# Patient Record
Sex: Female | Born: 1950
Health system: Southern US, Community
[De-identification: ages and names within clinical notes are randomized; demographics above are authoritative.]

## PROBLEM LIST (undated history)

## (undated) DIAGNOSIS — I10 Essential (primary) hypertension: Secondary | ICD-10-CM

## (undated) DIAGNOSIS — J45909 Unspecified asthma, uncomplicated: Secondary | ICD-10-CM

## (undated) DIAGNOSIS — K5730 Diverticulosis of large intestine without perforation or abscess without bleeding: Secondary | ICD-10-CM

## (undated) DIAGNOSIS — N83299 Other ovarian cyst, unspecified side: Secondary | ICD-10-CM

## (undated) DIAGNOSIS — G459 Transient cerebral ischemic attack, unspecified: Secondary | ICD-10-CM

## (undated) DIAGNOSIS — I059 Rheumatic mitral valve disease, unspecified: Secondary | ICD-10-CM

## (undated) DIAGNOSIS — I4719 Other supraventricular tachycardia: Secondary | ICD-10-CM

## (undated) DIAGNOSIS — R0602 Shortness of breath: Secondary | ICD-10-CM

## (undated) DIAGNOSIS — E119 Type 2 diabetes mellitus without complications: Secondary | ICD-10-CM

## (undated) DIAGNOSIS — Z950 Presence of cardiac pacemaker: Secondary | ICD-10-CM

## (undated) DIAGNOSIS — K219 Gastro-esophageal reflux disease without esophagitis: Secondary | ICD-10-CM

## (undated) DIAGNOSIS — K644 Residual hemorrhoidal skin tags: Secondary | ICD-10-CM

## (undated) DIAGNOSIS — K589 Irritable bowel syndrome without diarrhea: Secondary | ICD-10-CM

## (undated) DIAGNOSIS — G4730 Sleep apnea, unspecified: Secondary | ICD-10-CM

## (undated) DIAGNOSIS — Z79890 Hormone replacement therapy (postmenopausal): Secondary | ICD-10-CM

## (undated) DIAGNOSIS — K7460 Unspecified cirrhosis of liver: Secondary | ICD-10-CM

## (undated) DIAGNOSIS — R197 Diarrhea, unspecified: Secondary | ICD-10-CM

## (undated) DIAGNOSIS — R569 Unspecified convulsions: Secondary | ICD-10-CM

## (undated) DIAGNOSIS — I779 Disorder of arteries and arterioles, unspecified: Secondary | ICD-10-CM

## (undated) DIAGNOSIS — M1711 Unilateral primary osteoarthritis, right knee: Secondary | ICD-10-CM

## (undated) HISTORY — PX: TONSILLECTOMY: SUR1361

## (undated) HISTORY — DX: Unilateral primary osteoarthritis, right knee: M17.11

## (undated) HISTORY — DX: Irritable bowel syndrome, unspecified: K58.9

## (undated) HISTORY — DX: Other supraventricular tachycardia: I47.19

## (undated) HISTORY — DX: Diverticulosis of large intestine without perforation or abscess without bleeding: K57.30

## (undated) HISTORY — DX: Gastro-esophageal reflux disease without esophagitis: K21.9

## (undated) HISTORY — DX: Unspecified convulsions: R56.9

## (undated) HISTORY — DX: Other ovarian cyst, unspecified side: N83.299

## (undated) HISTORY — DX: Type 2 diabetes mellitus without complications: E11.9

## (undated) HISTORY — DX: Disorder of arteries and arterioles, unspecified: I77.9

## (undated) HISTORY — DX: Rheumatic mitral valve disease, unspecified: I05.9

## (undated) HISTORY — PX: TUBAL LIGATION: SHX77

## (undated) HISTORY — DX: Essential (primary) hypertension: I10

## (undated) HISTORY — DX: Transient cerebral ischemic attack, unspecified: G45.9

## (undated) HISTORY — DX: Presence of cardiac pacemaker: Z95.0

## (undated) HISTORY — DX: Residual hemorrhoidal skin tags: K64.4

## (undated) HISTORY — DX: Diarrhea, unspecified: R19.7

## (undated) HISTORY — PX: OOPHORECTOMY: SHX86

## (undated) HISTORY — DX: Hormone replacement therapy: Z79.890

---

## 1978-02-18 HISTORY — PX: CHOLECYSTECTOMY: SHX55

## 1988-02-19 HISTORY — PX: VAGINAL HYSTERECTOMY: SUR661

## 2001-04-29 ENCOUNTER — Ambulatory Visit (HOSPITAL_COMMUNITY): Admission: RE | Admit: 2001-04-29 | Discharge: 2001-04-29 | Payer: Self-pay | Admitting: Gastroenterology

## 2001-08-05 ENCOUNTER — Encounter: Payer: Self-pay | Admitting: Gastroenterology

## 2001-08-05 ENCOUNTER — Encounter: Admission: RE | Admit: 2001-08-05 | Discharge: 2001-08-05 | Payer: Self-pay | Admitting: Gastroenterology

## 2004-10-08 ENCOUNTER — Ambulatory Visit: Payer: Self-pay | Admitting: Cardiology

## 2004-10-11 ENCOUNTER — Ambulatory Visit: Payer: Self-pay | Admitting: Internal Medicine

## 2004-10-11 ENCOUNTER — Inpatient Hospital Stay (HOSPITAL_BASED_OUTPATIENT_CLINIC_OR_DEPARTMENT_OTHER): Admission: RE | Admit: 2004-10-11 | Discharge: 2004-10-11 | Payer: Self-pay | Admitting: Cardiology

## 2004-11-02 ENCOUNTER — Ambulatory Visit: Payer: Self-pay | Admitting: Cardiology

## 2006-07-01 ENCOUNTER — Ambulatory Visit: Payer: Self-pay | Admitting: Cardiology

## 2006-09-25 ENCOUNTER — Ambulatory Visit (HOSPITAL_COMMUNITY): Admission: RE | Admit: 2006-09-25 | Discharge: 2006-09-25 | Payer: Self-pay | Admitting: Internal Medicine

## 2006-12-11 ENCOUNTER — Ambulatory Visit (HOSPITAL_COMMUNITY): Admission: RE | Admit: 2006-12-11 | Discharge: 2006-12-11 | Payer: Self-pay | Admitting: Family Medicine

## 2007-05-07 ENCOUNTER — Inpatient Hospital Stay (HOSPITAL_COMMUNITY): Admission: EM | Admit: 2007-05-07 | Discharge: 2007-05-08 | Payer: Self-pay | Admitting: Emergency Medicine

## 2007-05-08 ENCOUNTER — Encounter (INDEPENDENT_AMBULATORY_CARE_PROVIDER_SITE_OTHER): Payer: Self-pay | Admitting: Family Medicine

## 2007-05-08 ENCOUNTER — Ambulatory Visit: Payer: Self-pay | Admitting: Cardiology

## 2007-10-23 LAB — HM COLONOSCOPY

## 2008-01-01 ENCOUNTER — Ambulatory Visit (HOSPITAL_COMMUNITY): Admission: RE | Admit: 2008-01-01 | Discharge: 2008-01-01 | Payer: Self-pay | Admitting: Family Medicine

## 2008-08-09 ENCOUNTER — Ambulatory Visit (HOSPITAL_COMMUNITY): Payer: Self-pay | Admitting: Psychiatry

## 2008-08-10 ENCOUNTER — Ambulatory Visit (HOSPITAL_COMMUNITY): Admission: RE | Admit: 2008-08-10 | Discharge: 2008-08-10 | Payer: Self-pay | Admitting: Family Medicine

## 2008-08-10 ENCOUNTER — Encounter: Payer: Self-pay | Admitting: Orthopedic Surgery

## 2008-08-15 ENCOUNTER — Ambulatory Visit (HOSPITAL_COMMUNITY): Payer: Self-pay | Admitting: Psychology

## 2008-08-24 ENCOUNTER — Ambulatory Visit: Payer: Self-pay | Admitting: Orthopedic Surgery

## 2008-08-24 DIAGNOSIS — M25819 Other specified joint disorders, unspecified shoulder: Secondary | ICD-10-CM | POA: Insufficient documentation

## 2008-08-24 DIAGNOSIS — M25519 Pain in unspecified shoulder: Secondary | ICD-10-CM | POA: Insufficient documentation

## 2008-08-24 DIAGNOSIS — M7580 Other shoulder lesions, unspecified shoulder: Secondary | ICD-10-CM

## 2008-08-30 ENCOUNTER — Ambulatory Visit (HOSPITAL_COMMUNITY): Payer: Self-pay | Admitting: Psychiatry

## 2008-10-04 ENCOUNTER — Ambulatory Visit (HOSPITAL_COMMUNITY): Payer: Self-pay | Admitting: Psychiatry

## 2008-10-11 ENCOUNTER — Ambulatory Visit (HOSPITAL_COMMUNITY): Payer: Self-pay | Admitting: Psychology

## 2008-11-02 ENCOUNTER — Ambulatory Visit (HOSPITAL_COMMUNITY): Payer: Self-pay | Admitting: Psychology

## 2008-11-03 ENCOUNTER — Ambulatory Visit (HOSPITAL_COMMUNITY): Payer: Self-pay | Admitting: Psychiatry

## 2008-11-18 ENCOUNTER — Ambulatory Visit (HOSPITAL_COMMUNITY): Payer: Self-pay | Admitting: Psychology

## 2008-12-19 ENCOUNTER — Ambulatory Visit (HOSPITAL_COMMUNITY): Payer: Self-pay | Admitting: Psychology

## 2008-12-22 ENCOUNTER — Encounter: Payer: Self-pay | Admitting: Orthopedic Surgery

## 2009-01-05 ENCOUNTER — Ambulatory Visit (HOSPITAL_COMMUNITY): Payer: Self-pay | Admitting: Psychiatry

## 2009-01-16 ENCOUNTER — Ambulatory Visit (HOSPITAL_COMMUNITY): Payer: Self-pay | Admitting: Psychology

## 2009-02-01 ENCOUNTER — Ambulatory Visit (HOSPITAL_COMMUNITY): Admission: RE | Admit: 2009-02-01 | Discharge: 2009-02-01 | Payer: Self-pay | Admitting: Family Medicine

## 2009-02-13 ENCOUNTER — Ambulatory Visit (HOSPITAL_COMMUNITY): Payer: Self-pay | Admitting: Psychology

## 2009-03-07 ENCOUNTER — Ambulatory Visit (HOSPITAL_COMMUNITY): Payer: Self-pay | Admitting: Psychiatry

## 2009-03-16 ENCOUNTER — Ambulatory Visit (HOSPITAL_COMMUNITY): Payer: Self-pay | Admitting: Psychology

## 2009-04-06 ENCOUNTER — Ambulatory Visit (HOSPITAL_COMMUNITY): Payer: Self-pay | Admitting: Psychology

## 2009-05-04 ENCOUNTER — Ambulatory Visit (HOSPITAL_COMMUNITY): Payer: Self-pay | Admitting: Psychiatry

## 2009-05-12 ENCOUNTER — Ambulatory Visit (HOSPITAL_COMMUNITY): Payer: Self-pay | Admitting: Psychology

## 2009-06-12 ENCOUNTER — Ambulatory Visit (HOSPITAL_COMMUNITY): Payer: Self-pay | Admitting: Psychology

## 2009-07-13 ENCOUNTER — Ambulatory Visit (HOSPITAL_COMMUNITY): Payer: Self-pay | Admitting: Psychology

## 2009-07-18 ENCOUNTER — Ambulatory Visit (HOSPITAL_COMMUNITY): Payer: Self-pay | Admitting: Psychiatry

## 2009-09-07 ENCOUNTER — Ambulatory Visit (HOSPITAL_COMMUNITY): Admission: RE | Admit: 2009-09-07 | Discharge: 2009-09-07 | Payer: Self-pay | Admitting: Ophthalmology

## 2009-09-14 ENCOUNTER — Ambulatory Visit (HOSPITAL_COMMUNITY): Payer: Self-pay | Admitting: Psychiatry

## 2009-10-02 ENCOUNTER — Ambulatory Visit (HOSPITAL_COMMUNITY): Payer: Self-pay | Admitting: Psychology

## 2009-11-14 ENCOUNTER — Ambulatory Visit (HOSPITAL_COMMUNITY): Payer: Self-pay | Admitting: Psychiatry

## 2009-11-20 ENCOUNTER — Ambulatory Visit (HOSPITAL_COMMUNITY): Payer: Self-pay | Admitting: Psychology

## 2009-11-27 ENCOUNTER — Ambulatory Visit (HOSPITAL_COMMUNITY): Payer: Self-pay | Admitting: Psychology

## 2009-12-08 ENCOUNTER — Ambulatory Visit (HOSPITAL_COMMUNITY): Admission: RE | Admit: 2009-12-08 | Discharge: 2009-12-08 | Payer: Self-pay | Admitting: Family Medicine

## 2009-12-28 ENCOUNTER — Ambulatory Visit (HOSPITAL_COMMUNITY): Payer: Self-pay | Admitting: Psychology

## 2010-01-30 ENCOUNTER — Ambulatory Visit: Payer: Self-pay | Admitting: Internal Medicine

## 2010-02-13 ENCOUNTER — Ambulatory Visit (HOSPITAL_COMMUNITY): Payer: Self-pay | Admitting: Psychiatry

## 2010-02-28 ENCOUNTER — Encounter
Admission: RE | Admit: 2010-02-28 | Discharge: 2010-02-28 | Payer: Self-pay | Source: Home / Self Care | Attending: Sports Medicine | Admitting: Sports Medicine

## 2010-03-11 ENCOUNTER — Encounter: Payer: Self-pay | Admitting: Family Medicine

## 2010-03-13 ENCOUNTER — Ambulatory Visit (HOSPITAL_COMMUNITY)
Admission: RE | Admit: 2010-03-13 | Discharge: 2010-03-13 | Payer: Self-pay | Source: Home / Self Care | Attending: Family Medicine | Admitting: Family Medicine

## 2010-05-05 LAB — BASIC METABOLIC PANEL
CO2: 24 mEq/L (ref 19–32)
Calcium: 9.2 mg/dL (ref 8.4–10.5)
Chloride: 101 mEq/L (ref 96–112)
GFR calc Af Amer: >60 mL/min (ref >60)
Glucose, Bld: 300 mg/dL — ABNORMAL HIGH (ref 70–99)
Potassium: 3.7 mEq/L (ref 3.5–5.1)
Sodium: 135 mEq/L (ref 135–145)

## 2010-05-05 LAB — GLUCOSE, CAPILLARY: Glucose-Capillary: 131 mg/dL — ABNORMAL HIGH (ref 70–99)

## 2010-05-05 LAB — HEMOGLOBIN AND HEMATOCRIT, BLOOD: HCT: 36.1 % (ref 36.0–46.0)

## 2010-06-14 ENCOUNTER — Encounter (INDEPENDENT_AMBULATORY_CARE_PROVIDER_SITE_OTHER): Payer: BC Managed Care – PPO | Admitting: Psychiatry

## 2010-06-14 DIAGNOSIS — F39 Unspecified mood [affective] disorder: Secondary | ICD-10-CM

## 2010-07-03 NOTE — Consult Note (Signed)
Carol Richmond, Carol Richmond           ACCOUNT NO.:  [id number]   MEDICAL RECORD NO.:  [id number]          PATIENT TYPE:  INP   LOCATION:  A307                          FACILITY:  [facility]   PHYSICIAN:  Kofi A. Merlene Laughter, M.D. DATE OF BIRTH:  07/29/1950   DATE OF CONSULTATION:  DATE OF DISCHARGE:                                 CONSULTATION   REASON FOR CONSULTATION:  Syncope.  The patient is a 60 year old white  female who apparently had a spell about a week ago where she essentially  became amnestic for a while and really could not provide significant  details.  She apparently had a similar event a few months ago.  It  appears that a coworker related the description to the family, and  reports that the patient apparently became unresponsive, staring off in  space with a glassy-eyed appearance.  She has drooping of the right  lower facial area and fell asleep afterwards.  Since that event, the  family reports she has been having short-term memory problems.  No focal  weakness is reported.  Again, the patient is amnestic to the event.  The  family reports that she had a remote history of seizures and was on  dilantin many years ago.  They could not give me a description of the  seizures, however.  No headaches are reported or fevers.  No chest pain  or shortness of breath.  No tonic-clonic activity or loss of bowel or  bladder control.  No palpitations.   PAST MEDICAL HISTORY:  Significant for gastroesophageal reflux disease,  hypertension.   PAST SURGICAL HISTORY:  Cholecystectomy, tonsillectomy, tubal ligation.   ALLERGIES:  None known.   ADMISSION MEDICATIONS:  Topamax.  Unclear why she is taking this  medication.  Also Nexium.  She is also taking Ativan.  Doses are  unknown.   SOCIAL HISTORY:  No alcohol, tobacco, or illicit drug use.   REVIEW OF SYSTEMS:  Essentially unrevealing, other than stated in the  history of present illness.  Unchanged from the emergency room   physician.   PHYSICAL EXAMINATION:  Showed a thin, pleasant lady in no acute  distress.  Temperature is 97.4, pulse 78, respirations 20, blood pressure 97/63.  HEENT evaluation, neck is supple.  Head is normocephalic, atraumatic.  ABDOMEN:  Soft.  EXTREMITIES:  No significant edema.  MENTATION:  The patient is awake and alert.  She is lucid and coherent.  No dysarthria noted.  No aphasia noted.  CRANIAL NERVE EVALUATION:  Pupils are 5 mm and reactive.  Visual field  intact.  Extraocular movements are full.  Facial muscle strength is  symmetric.  Tongue is midline.  Uvula midline.  Carotid upstroke normal.  MOTOR EXAMINATION:  Shows normal tone and strength.  I see no pronator  drift.  Coordination is intact.  No tremors are noted.  No past-  pointing.  Reflexes are symmetric and normal, although she has extensor  plantar reflexes.  Sensation is normal to pinprick and light touch.   SUPPORTIVE DATA:  WBC 9, hemoglobin 13.9, platelet count 305,000.  Urinalysis shows specific gravity of 1030.  Urobilinogen  0.2.  Otherwise, negative.  Leukocytes negative.  Nitrites negative.  Alcohol  level, none detected.  Urine drug screen is positive for benzodiazepines  and amphetamines.  Magnesium 2.  Sodium 136, potassium 3.2, chloride  104, CO2 27, glucose 96, BUN 18, creatinine 0.7, calcium 8.6.  Liver  enzymes normal.   ASSESSMENT:  The patient symptomatology is most consistent with non-  convulsive complex partial seizures.  Transient ischemic attack is  possible, but seems less likely.   RECOMMENDATIONS:  1. EEG.  2. MRI of the brain.  3. Anticonvulsive medication, prefer Keppra.  She is on Topamax, but      we do not know the does of that medication.  In this situation      where she is apparently still having spells on that medication.      Thank you for the consultation.      Kofi A. Merlene Laughter, M.D.  Electronically Signed     KAD/MEDQ  D:  05/08/2007  T:  05/08/2007  Job:   [id number]

## 2010-07-03 NOTE — H&P (Signed)
NAMEGIULLIANA, MCROBERTS NO.:  [id number]   MEDICAL RECORD NO.:  [id number]          PATIENT TYPE:  INP   LOCATION:  A307                          FACILITY:  [facility]   PHYSICIAN:  Salem Caster, DO    DATE OF BIRTH:  09-27-1950   DATE OF ADMISSION:  05/07/2007  DATE OF DISCHARGE:  LH                              HISTORY & PHYSICAL   PRIMARY CARE PHYSICIAN:  Dr. Hilma Favors.   CHIEF COMPLAINT:  Syncope.   HISTORY OF PRESENT ILLNESS:  This is a 60 year old female who presents  with an acute syncopal episode.  Apparently, the patient blacked out at  work.  Patient states that up until that day, she was feeling fine.  Since then, she has been very fatigued.  She did admit to the emergency  room that she was having a lot of worrying and felt somewhat depressed.  Patient has a history of seizures many, many years ago and was placed on  medication but has not had any episodes for quite some time.  The  patient denied any major headaches, chills, chest pain, nausea,  vomiting, diarrhea, or diaphoresis.  The patient presented with the  above complaints and seems stable on my exam.   PAST MEDICAL HISTORY:  1. Hypertension.  2. Irritable bowel.  3. GERD.  4. Migraines.  5. Anxiety.  6. Allergic rhinitis.   REVIEW OF SYSTEMS:  Please see HPI.   ALLERGIES:  No known drug allergies.   HOME MEDICATIONS:  1. Bentyl 10 mg 3 times daily.  2. Ativan 1 mg 3 times daily.  3. Topamax 50 mg twice daily.  4. Allegra 180 mg daily.  5. Singulair 10 mg daily.  6. Adipex-P 37.5 mg daily.  7. Nexium 40 mg daily.  8. Celexa 40 mg daily.  9. Lisinopril/HCTZ 10/12.5 mg daily.  10.Premarin 0.625, alternate schedule, 1 tablet daily, then 2 tablets.   PAST SURGICAL HISTORY:  1. Cholecystectomy.  2. Tonsillectomy.  3. Tubal ligation.  4. Hysterectomy.   SOCIAL HISTORY:  No history of smoking, alcohol, or drug abuse.   PHYSICAL EXAMINATION:  VITAL SIGNS:  Temperature 97.5, pulse  71,  respirations 20, blood pressure 95/62.  GENERAL:  She is awake, alert and oriented.  No acute distress.  HEENT:  Head is normocephalic and atraumatic.  Eyes are PERRL, EOMI.  NECK:  Soft, supple, nontender, nondistended.  LUNGS:  Clear to auscultation bilaterally.  No wheezes, rales or  rhonchi.  CARDIOVASCULAR:  Regular rate and rhythm.  No murmurs, rubs or gallops.  ABDOMEN:  Soft, nontender, nondistended.  No rigidity or guarding.  NEUROLOGIC:  She is alert and oriented x3.  Cranial nerves II-XII are  grossly intact.  Patient did admit to some unsteady gait with slight  lightheadedness.  EXTREMITIES:  No clubbing, cyanosis or edema.   LABS:  Lipid profile:  Cholesterol 196, triglycerides 192, HDL 38, LDL  120.  Sodium 136, potassium 3.8, chloride 107, CO2 26, glucose 100, BUN  15, creatinine 0.74, phosphorus 2.9, magnesium 2.4.  Total creatinine  kinase is 32.  CK-MB is 0.6.  Troponin 0.02.  PTT  is 27.  PT is 12.6.  INR 0.9.  White count 8.3, hemoglobin 12.9, hematocrit 36.3, platelets  275.  Urine drug screen was positive for benzodiazepines and  amphetamines.  Ethanol level was less than 5.   Patient did have an MRI of her brain without contrast performed.  It  showed:  1)  Abnormal signal in the pons, including dorsally in the  region of the MLS and the hemispheric white matter, including  involvement of the deep periventricular white matter.  The constellation  of findings raised the possibility of multiple sclerosis.  The findings  could simply be due to old small vessel insult.  No evidence of acute  stroke.  2)  Fluid in the left mastoid air cells.   ASSESSMENT/PLAN:  1. Syncopal episode:  Patient will be admitted to a general medical      bed.  Neurology will be consulted.  Patient will get carotid      Doppler studies.  Patient will be rehydrated and get orthostatic      vital signs.  Will also order a 2D echocardiogram.  2. For a history of hypertension, patient  will be placed on home      medications.  3. Anxiety/depression:  Patient will also be placed on her home      medications at this time.  4. Patient will be on DVT as well as GI prophylaxis also.      Salem Caster, DO  Electronically Signed     SM/MEDQ  D:  05/08/2007  T:  05/08/2007  Job:  [id number]   cc:   Dr. Hilma Favors

## 2010-07-03 NOTE — Procedures (Signed)
NAMEPERPETUA, ELLING           ACCOUNT NO.:  [id number]   MEDICAL RECORD NO.:  [id number]         PATIENT TYPE:  PINP   LOCATION:  A307                          FACILITY:  [facility]   PHYSICIAN:  Kofi A. Merlene Laughter, M.D. DATE OF BIRTH:  September 03, 1950   DATE OF PROCEDURE:  05/08/2007  DATE OF DISCHARGE:  05/08/2007                              EEG INTERPRETATION   HISTORY:  This is a 60 year old lady who presents with spells of  confusion suspicious for complex partial seizure.   MEDICATIONS:  1. Protonix.  2. Xanax.  3. Phenergan.  4. Zofran.  5. Tylenol.  6. Guaifenesin.  7. Topamax.  8. Nexium.  9. Ativan.   RECORDING DATE:  05/08/2007.   ANALYSIS:  A 16-channel recording using standard 10/20 system is  conducted for 23 minutes.  There is a well-formed posterior rhythm of 9  Hz which attenuates with eye opening.  Awake and drowsy activities are  observed.  Photic stimulation and hyperventilation are carried out  without significant changes in the background activity.  There is no  focal or lateralized slowing.  There is no epileptiform activity  observed.   IMPRESSION:  Normal recording of the awake and drowsy states.  If  clinically indicated, a sleep-deprived recording could be useful.      Kofi A. Merlene Laughter, M.D.  Electronically Signed     KAD/MEDQ  D:  05/12/2007  T:  05/12/2007  Job:  [id number]

## 2010-07-03 NOTE — Discharge Summary (Signed)
Carol Richmond, CUTBIRTH NO.:  [id number]   MEDICAL RECORD NO.:  [id number]          PATIENT TYPE:  INP   LOCATION:  A307                          FACILITY:  [facility]   PHYSICIAN:  Salem Caster, DO    DATE OF BIRTH:  05-17-1950   DATE OF ADMISSION:  05/07/2007  DATE OF DISCHARGE:  03/20/2009LH                               DISCHARGE SUMMARY   DISCHARGE DIAGNOSES:  1. Syncopal episode.  2. Probably complex partial seizure.  3. History of hypertension.  4. History of headaches.  5. History of irritable bowel syndrome .  6. History of anxiety and depression.   HOSPITAL COURSE:  This is a 60 year old female who presented with  syncopal episode.  The patient presented after having syncopal episode  at work.  The patient has been very lethargic and stated in the  emergency room that she was feeling somewhat depressed.  The patient  states she has been taking her medications as directed and overall has  been feeling not too bad, but does not remember much after having her  syncopal episode.  The patient has had no chest pain, shortness of  breath, diaphoresis, fevers or any numbness of any kind.  The patient  presented with these symptoms and she was admitted.  Neurology was  consulted.  EEG, 2-D echocardiogram and carotid Doppler studies were  performed.  Her EEG, carotid Dopplers and echocardiogram results are all  pending at this time, but the patient is greatly improved and she wants  to go home.  Neurology felt the patient had a complex partial seizure  and the patient was placed on Keppra 500 mg twice daily.  The patient  did have MRI of her brain performed which showed a possibility of MS.  It showed some hemispheric white matter including involvement of the  deep periventricular white matter, but also states that it could be due  to old small vessel insults.  No evidence of stroke and they found fluid  in the left mastoid air cells.  At this time, I feel the  patient is  stable to be discharged.  She will follow up with her primary care  physician and follow up with neurology in 1-2 weeks.   DISCHARGE PHYSICAL EXAMINATION:  VITAL SIGNS:  Temperature 97.5, pulse  71, respirations 20, blood pressure 95/62, saturations 99% on room air.   DISCHARGE LABORATORY DATA AND X-RAY FINDINGS:  Sodium 136, potassium  3.8, chloride 107, CO2 26, glucose 100, BUN 15, creatinine 0.74.  White  count 8.3, hemoglobin 12.9, hematocrit 36.3, platelets 275.  Phosphorus  2.9, magnesium 2.4, total creatinine kinase 32, CK-MB 0.6, troponin  0.02.   CONDITION ON DISCHARGE:  Stable.   DISPOSITION:  Discharged to home.   DIET:  Maintain low sodium, heart-healthy diet.   ACTIVITY:  Increase activity slowly.   FOLLOW UP:  The patient is to follow up with Dr. Hilma Favors in 1 week.  She  is to follow up with Dr. Merlene Laughter in 2 weeks.  The patient will be out  of work for approximately 2 weeks until she is seen by neurology.  Copy  of her imaging studies including carotid Doppler and 2-D echocardiogram  will be sent to Dr. Delanna Ahmadi office when reports are available.      Salem Caster, DO  Electronically Signed     SM/MEDQ  D:  05/08/2007  T:  05/08/2007  Job:  [id number]   cc:   Halford Chessman, M.D.  Fax: [phone]

## 2010-07-06 NOTE — Cardiovascular Report (Signed)
Carol Richmond, Carol Richmond NO.:  [id number]   MEDICAL RECORD NO.:  [id number]          PATIENT TYPE:  OIB   LOCATION:  6501                         FACILITY:  [facility]   PHYSICIAN:  Glori Bickers, M.D. LHCDATE OF BIRTH:  05-08-1950   DATE OF PROCEDURE:  10/11/2004  DATE OF DISCHARGE:  10/11/2004                              CARDIAC CATHETERIZATION   PRIMARY CARE PHYSICIAN:  Dr. Rowe Pavy in the [place].   CARDIOLOGIST:  Dr. Rozann Lesches also in the [place] office.   PATIENT IDENTIFICATION:  Carol Richmond is a 60 year old woman with a history  of hypertension who has had several week history of intermittent chest  discomfort which is primarily with exertion. Chest discomfort has both  typical and atypical features. She was recently admitted to [facility] where she ruled out for myocardial infarction. CT angiography of  the chest showed normal proximal coronary arteries; however, they were  unable to see the distal beds well. Given her ongoing chest pain, she was  referred for cardiac catheterization.   PROCEDURES PERFORMED:  1.  Selective coronary angiography.  2.  Left heart cath.  3.  Left ventriculogram.   DESCRIPTION OF PROCEDURE:  The risks and benefits of catheterization were  explained to Carol Richmond and consent was signed and placed on the chart. A  4-French arterial sheath was placed in the right femoral artery using  anterior puncture and a modified Seldinger technique. Standard catheters  including JL-4, JR-4 and angled pigtail were used throughout the procedure.  All catheter exchanges were made over wire. There were no apparent  complications.   Central aortic pressure is 113/73 with mean of 90. LV pressure was 113/2  with an EDP of 5. There was no gradient on aortic valve pullback.   Left main: Was normal.   LAD was a long vessel coursing to the apex. It gave off a very tiny first  diagonal and a large second diagonal. There was minor  irregularity in the  distal LAD. In the very tiny first diagonal, there was 75-80% ostial lesion.  This vessel was 1.0 mm.   Left circumflex was moderate to large size vessel. It gave off a large  branching OM1. The distal AV groove circumflex was small. There was minor  irregularity in the circumflex just prior to the takeoff of the OM1.   Right coronary artery was a large dominant vessel which gave off PDA and a  small PL. There was no angiographic CAD.   Left ventriculogram done in the RAO approach showed an EF of 60% with no  focal wall motion abnormalities. There was no mitral regurgitation. On  panning down over the abdominal aorta after of the left ventriculogram,  there was no evidence of abdominal aortic aneurysm.   ASSESSMENT:  1.  A minimal nonobstructive coronary disease in the major epicardial      vessels. There was a high grade lesion in the ostium of the tiny D1      which was not amenable to percutaneous intervention and would likely not      be the cause of her symptoms.  2.  Normal left ventricular function.   PLAN:  Will be for continued medical therapy. Consideration will be given to  a possible GI workup for other causes of her chest and epigastric  discomfort.      Glori Bickers, M.D. [facility]  Electronically Signed     DB/MEDQ  D:  10/11/2004  T:  10/11/2004  Job:  [id number]   cc:   Rowe Pavy, M.D.  [facility], [place]   Satira Sark, M.D. [facility]  [address] [place]  [place] [address]

## 2010-07-06 NOTE — Procedures (Signed)
[facility]  Patient:    Carol, Richmond Visit Number: [id number] MRN: [id number]          Service Type: END Location: ENDO Attending Physician:  Rafael Bihari Dictated by:   Elyse Jarvis Amedeo Plenty, M.D. Proc. Date: 04/29/01 Admit Date:  04/29/2001   CC:         [name], M.D.   Procedure Report  PROCEDURE:  Esophagogastroduodenoscopy.  INDICATION FOR PROCEDURE:  Refractory reflux symptoms in 60 year old patient requiring double dose Nexium therapy. Procedure is to help guide therapy in terms of possible antireflux surgery and to rule out Barretts esophagus.  DESCRIPTION OF PROCEDURE:  The patient was placed in the left lateral decubitus position and placed on the pulse monitor with continuous low flow oxygen delivered via nasal cannula. She was sedated with 70 mg IV Demerol and 6 mg IV Versed. The Olympus video endoscope was advanced under direct vision into the oropharynx and esophagus. The esophagus was straight and of normal caliber to the squamocolumnar line at approximately 38 cm. There was an end widely patent lower esophageal ring only seen during one occasion when the lower esophageal sphincter was fully relaxed. There also appeared to be a less than 1 cm sliding hiatal hernia. The stomach was entered and a small amount of liquid secretions were suctioned from the fundus. Retroflex view of the cardia was unremarkable. The fundus and body appeared normal. The antrum showed some mild erythema and granularity consistent with antral gastritis with no focal erosions or ulcers. The pylorus was nondeformed and easily allowed passage of the endoscope tip into the duodenum. Both the bulb and second portion were well inspected and appeared to be within normal limits. The scope was then withdrawn back into the stomach and CLO test obtained. The scope was then withdrawn and the patient returned to the recovery room in stable condition. He  tolerated the procedure well and there were no immediate complications.  IMPRESSION: 1. Mild antral gastritis. 2. Widely patent lower esophageal ring with minimal hiatal hernia.  PLAN:  Continue medical therapy but will discuss option of antireflux surgery, if it is appealing to her. Dictated by:   Elyse Jarvis Amedeo Plenty, M.D. Attending Physician:  Rafael Bihari DD:  04/29/01 TD:  04/29/01 Job: [id number]

## 2010-09-10 ENCOUNTER — Encounter (INDEPENDENT_AMBULATORY_CARE_PROVIDER_SITE_OTHER): Payer: Self-pay

## 2010-09-10 ENCOUNTER — Other Ambulatory Visit (INDEPENDENT_AMBULATORY_CARE_PROVIDER_SITE_OTHER): Payer: Self-pay | Admitting: *Deleted

## 2010-09-10 DIAGNOSIS — G43909 Migraine, unspecified, not intractable, without status migrainosus: Secondary | ICD-10-CM | POA: Insufficient documentation

## 2010-09-10 DIAGNOSIS — Z79890 Hormone replacement therapy (postmenopausal): Secondary | ICD-10-CM | POA: Insufficient documentation

## 2010-09-10 DIAGNOSIS — R569 Unspecified convulsions: Secondary | ICD-10-CM | POA: Insufficient documentation

## 2010-09-10 DIAGNOSIS — I10 Essential (primary) hypertension: Secondary | ICD-10-CM | POA: Insufficient documentation

## 2010-09-10 DIAGNOSIS — I152 Hypertension secondary to endocrine disorders: Secondary | ICD-10-CM | POA: Insufficient documentation

## 2010-09-10 DIAGNOSIS — K644 Residual hemorrhoidal skin tags: Secondary | ICD-10-CM | POA: Insufficient documentation

## 2010-09-10 DIAGNOSIS — K219 Gastro-esophageal reflux disease without esophagitis: Secondary | ICD-10-CM | POA: Insufficient documentation

## 2010-09-10 MED ORDER — DICYCLOMINE HCL 20 MG PO TABS: 20.0000 mg | ORAL_TABLET | Freq: Two times a day (BID) | ORAL | Status: DC

## 2010-09-10 NOTE — Telephone Encounter (Signed)
No Pharmacy entered , Approved by Dr. Laural Golden , Rx will be called into [facility], [place].  Patient will be called and made aware.

## 2010-10-10 ENCOUNTER — Other Ambulatory Visit (HOSPITAL_COMMUNITY): Payer: Self-pay | Admitting: Family Medicine

## 2010-10-10 DIAGNOSIS — H538 Other visual disturbances: Secondary | ICD-10-CM

## 2010-10-11 ENCOUNTER — Encounter (HOSPITAL_COMMUNITY): Payer: BC Managed Care – PPO | Admitting: Psychiatry

## 2010-11-12 LAB — COMPREHENSIVE METABOLIC PANEL
ALT: 14
AST: 17
BUN: 15
CO2: 26
CO2: 27
Chloride: 104
Chloride: 107
Creatinine, Ser: 0.7
Creatinine, Ser: 0.74
GFR calc Af Amer: >60
GFR calc non Af Amer: >60
GFR calc non Af Amer: >60
Glucose, Bld: 100 — ABNORMAL HIGH
Glucose, Bld: 96
Sodium: 136
Total Bilirubin: 0.3
Total Bilirubin: 0.3

## 2010-11-12 LAB — DIFFERENTIAL
Basophils Absolute: 0
Basophils Absolute: 0
Lymphocytes Relative: 36
Lymphocytes Relative: 37
Monocytes Absolute: 1
Neutro Abs: 4.3
Neutro Abs: 4.6
Neutrophils Relative %: 51
Neutrophils Relative %: 51

## 2010-11-12 LAB — URINALYSIS, ROUTINE W REFLEX MICROSCOPIC
Hgb urine dipstick: NEGATIVE
Nitrite: NEGATIVE
Protein, ur: NEGATIVE
Specific Gravity, Urine: >1.03 — ABNORMAL HIGH
Urobilinogen, UA: 0.2

## 2010-11-12 LAB — CULTURE, BLOOD (ROUTINE X 2)
Culture: NO GROWTH
Report Status: 3252009
Report Status: 3252009

## 2010-11-12 LAB — POCT CARDIAC MARKERS
Myoglobin, poc: 26.3
Operator id: 216471

## 2010-11-12 LAB — PROTIME-INR
INR: 0.9
Prothrombin Time: 12.6

## 2010-11-12 LAB — RAPID URINE DRUG SCREEN, HOSP PERFORMED
Amphetamines: POSITIVE — AB
Barbiturates: NOT DETECTED

## 2010-11-12 LAB — CARDIAC PANEL(CRET KIN+CKTOT+MB+TROPI): Troponin I: 0.02

## 2010-11-12 LAB — CBC
HCT: 36.3
Hemoglobin: 13.9
MCV: 85.4
MCV: 85.6
Platelets: 275
RBC: 4.24
RBC: 4.57
WBC: 8.3
WBC: 9.1

## 2010-11-12 LAB — MAGNESIUM: Magnesium: 2.4

## 2010-11-12 LAB — LIPID PANEL
Total CHOL/HDL Ratio: 5.2
VLDL: 38

## 2011-02-01 ENCOUNTER — Other Ambulatory Visit (HOSPITAL_COMMUNITY): Payer: Self-pay | Admitting: Psychiatry

## 2011-02-08 ENCOUNTER — Encounter (INDEPENDENT_AMBULATORY_CARE_PROVIDER_SITE_OTHER): Payer: Self-pay | Admitting: *Deleted

## 2011-02-28 ENCOUNTER — Ambulatory Visit (INDEPENDENT_AMBULATORY_CARE_PROVIDER_SITE_OTHER): Payer: BC Managed Care – PPO | Admitting: Internal Medicine

## 2011-02-28 ENCOUNTER — Encounter (INDEPENDENT_AMBULATORY_CARE_PROVIDER_SITE_OTHER): Payer: Self-pay | Admitting: Internal Medicine

## 2011-02-28 VITALS — BP 100/64 | HR 72 | Temp 97.9°F | Ht 64.0 in | Wt 195.9 lb

## 2011-02-28 NOTE — Patient Instructions (Signed)
CBC, CMet, Further recommendations once we have the lab work back.

## 2011-02-28 NOTE — Progress Notes (Addendum)
Subjective:     [name], female   DOB: Jan 19, 1951, 61 y.o.   MRN: [id number]  HPI Referred by Dr. Hilma Favors for elevated liver enzymes. Noted 01/31/2011 AST 186 and ALT 126. ALP 101. She tells me that they have not been elevated in the past. She denies hx of IV drug use. She does not have any tattoos. She denies prior hx of jaundice.  No new medications.  Four or five months ago she was on a z-pak.  Appetite is good. No weight loss. No abdominal pain. She usually has a BM about every day. No melena or bright red rectal bleeding.  Review of Systems see hpi Current Outpatient Prescriptions  Medication Sig Dispense Refill  . lamoTRIgine (LAMICTAL) 200 MG tablet Take 200 mg by mouth daily.        [name] lamoTRIgine (LAMICTAL) 200 MG tablet Take 200 mg by mouth daily.      [name] lisinopril-hydrochlorothiazide (PRINZIDE,ZESTORETIC) 20-12.5 MG per tablet Take 1 tablet by mouth daily.        [name] LORazepam (ATIVAN PO) Take by mouth 2 (two) times daily.        [name] losartan-hydrochlorothiazide (HYZAAR) 100-12.5 MG per tablet Take 1 tablet by mouth daily.      [name] METFORMIN HCL PO Take 500 mg by mouth 2 (two) times daily before a meal.       . mometasone-formoterol (DULERA) 100-5 MCG/ACT AERO Inhale 2 puffs into the lungs 2 (two) times daily.      [name] omeprazole (PRILOSEC) 20 MG capsule Take 20 mg by mouth daily.      . simvastatin (ZOCOR) 20 MG tablet Take 20 mg by mouth every evening.       Past Medical History  Diagnosis Date  . GERD (gastroesophageal reflux disease)   . Diarrhea   . Hypertension   . Hormone replacement therapy (postmenopausal)   . Migraine   . Seizures   . IBS (irritable bowel syndrome)   . Functional ovarian cysts   . Diverticulosis of colon (without mention of hemorrhage)   . Hemorrhoids, external, without mention of complication    Past Surgical History  Procedure Date  . Cholecystectomy 1980  . Vaginal hysterectomy 1990  . Tonsillectomy   . Oophorectomy     2012  Dr. Adah Perl in [place]   History   Social History  . Marital Status: Married    Spouse Name: N/A    Number of Children: N/A  . Years of Education: N/A   Occupational History  . Not on file.   Social History Main Topics  . Smoking status: Never Smoker   . Smokeless tobacco: Never Used  . Alcohol Use: No  . Drug Use: No  . Sexually Active:    Other Topics Concern  . Not on file   Social History Narrative  . No narrative on file   Family Status  Relation Status Death Age  . Mother Deceased   . Father Deceased   . Sister Alive   . Brother Alive   . Sister Deceased   . Brother Alive   . Brother Alive    No Known Allergies     Objective:   Physical Exam Filed Vitals:   02/28/11 1005  BP: 100/64  Pulse: 72  Temp: 97.9 F (36.6 C)  Height: 5' 4"  (1.626 m)  Weight: 195 lb 14.4 oz (88.86 kg)    Alert and oriented. Skin warm and dry. Oral mucosa is moist.   .  Sclera anicteric, conjunctivae is pink. Thyroid not enlarged. No cervical lymphadenopathy. Lungs clear. Heart regular rate and rhythm.  Abdomen is soft and obese.  Bowel sounds are positive. No hepatomegaly. No abdominal masses felt. No tenderness.  No edema to lower extremities. Patient is alert and oriented.      Assessment:    Elevated transaminases from questionable etiology.     Plan:    Will repeat a c-met today. If elevated will do work up to rule out auto immune process, Hep B and C.

## 2011-03-01 LAB — CBC WITH DIFFERENTIAL/PLATELET
Basophils Absolute: 0 10*3/uL (ref 0.0–0.1)
Basophils Relative: 0 % (ref 0–1)
Eosinophils Absolute: 0.2 10*3/uL (ref 0.0–0.7)
Hemoglobin: 12.8 g/dL (ref 12.0–15.0)
MCH: 29.5 pg (ref 26.0–34.0)
MCHC: 32.6 g/dL (ref 30.0–36.0)
Monocytes Relative: 12 % (ref 3–12)
Neutro Abs: 8 10*3/uL — ABNORMAL HIGH (ref 1.7–7.7)
Neutrophils Relative %: 53 % (ref 43–77)
Platelets: 310 10*3/uL (ref 150–400)
RDW: 14.7 % (ref 11.5–15.5)

## 2011-03-01 LAB — COMPREHENSIVE METABOLIC PANEL
AST: 76 U/L — ABNORMAL HIGH (ref 0–37)
Albumin: 4.4 g/dL (ref 3.5–5.2)
BUN: 19 mg/dL (ref 6–23)
Calcium: 9.4 mg/dL (ref 8.4–10.5)
Chloride: 103 mEq/L (ref 96–112)
Potassium: 4.7 mEq/L (ref 3.5–5.3)
Sodium: 136 mEq/L (ref 135–145)
Total Protein: 7 g/dL (ref 6.0–8.3)

## 2011-03-04 ENCOUNTER — Other Ambulatory Visit (INDEPENDENT_AMBULATORY_CARE_PROVIDER_SITE_OTHER): Payer: Self-pay | Admitting: Internal Medicine

## 2011-03-04 DIAGNOSIS — R748 Abnormal levels of other serum enzymes: Secondary | ICD-10-CM

## 2011-03-04 NOTE — Progress Notes (Signed)
U/S sch'd 03/14/11 @ 800 (745), NPO after midnight, patient ware

## 2011-03-06 ENCOUNTER — Telehealth (INDEPENDENT_AMBULATORY_CARE_PROVIDER_SITE_OTHER): Payer: Self-pay | Admitting: *Deleted

## 2011-03-06 DIAGNOSIS — K5792 Diverticulitis of intestine, part unspecified, without perforation or abscess without bleeding: Secondary | ICD-10-CM

## 2011-03-06 NOTE — Telephone Encounter (Addendum)
Per Carol Richmond the patient will need a repeat C-Met in 8 weeks, she will also be having a repeat U/S of Abd. In 8 weeks   Per Carol Richmond CMET is all that needs to be repeated in 8 weeks, not U/S

## 2011-03-14 ENCOUNTER — Ambulatory Visit (HOSPITAL_COMMUNITY)
Admission: RE | Admit: 2011-03-14 | Discharge: 2011-03-14 | Disposition: A | Discharge status: Home / Self Care | Payer: Medicare Other | Source: Ambulatory Visit | Attending: Internal Medicine | Admitting: Internal Medicine

## 2011-03-14 DIAGNOSIS — R748 Abnormal levels of other serum enzymes: Secondary | ICD-10-CM

## 2011-03-14 DIAGNOSIS — E7800 Pure hypercholesterolemia, unspecified: Secondary | ICD-10-CM | POA: Insufficient documentation

## 2011-03-14 DIAGNOSIS — I10 Essential (primary) hypertension: Secondary | ICD-10-CM | POA: Insufficient documentation

## 2011-03-14 DIAGNOSIS — E119 Type 2 diabetes mellitus without complications: Secondary | ICD-10-CM | POA: Insufficient documentation

## 2011-03-15 ENCOUNTER — Telehealth (INDEPENDENT_AMBULATORY_CARE_PROVIDER_SITE_OTHER): Payer: Self-pay | Admitting: Internal Medicine

## 2011-03-15 NOTE — Telephone Encounter (Signed)
Will call in an RX for Phenergan 12.46m po q 6 hrs [facility] [place]  I was unable to E prescribe this medication

## 2011-03-15 NOTE — Telephone Encounter (Signed)
C/o nausea and chills. Will eprescribe an Rx for phenergan 12.24m po q 6 as needed

## 2011-04-18 ENCOUNTER — Encounter (INDEPENDENT_AMBULATORY_CARE_PROVIDER_SITE_OTHER): Payer: Self-pay | Admitting: *Deleted

## 2011-07-26 ENCOUNTER — Ambulatory Visit
Admission: RE | Admit: 2011-07-26 | Discharge: 2011-07-26 | Disposition: A | Discharge status: Home / Self Care | Payer: Medicare Other | Source: Ambulatory Visit | Attending: Orthopedic Surgery | Admitting: Orthopedic Surgery

## 2011-07-26 ENCOUNTER — Other Ambulatory Visit: Payer: Self-pay | Admitting: Orthopedic Surgery

## 2011-07-26 DIAGNOSIS — M79606 Pain in leg, unspecified: Secondary | ICD-10-CM

## 2011-08-16 ENCOUNTER — Other Ambulatory Visit: Payer: Self-pay | Admitting: Orthopedic Surgery

## 2011-08-16 DIAGNOSIS — M25561 Pain in right knee: Secondary | ICD-10-CM

## 2011-08-23 ENCOUNTER — Ambulatory Visit
Admission: RE | Admit: 2011-08-23 | Discharge: 2011-08-23 | Disposition: A | Discharge status: Home / Self Care | Payer: Medicare Other | Source: Ambulatory Visit | Attending: Orthopedic Surgery | Admitting: Orthopedic Surgery

## 2011-08-23 DIAGNOSIS — M25561 Pain in right knee: Secondary | ICD-10-CM

## 2012-08-18 ENCOUNTER — Encounter: Payer: Self-pay | Admitting: Physician Assistant

## 2012-08-18 ENCOUNTER — Other Ambulatory Visit: Payer: Self-pay | Admitting: Physician Assistant

## 2012-08-18 DIAGNOSIS — K589 Irritable bowel syndrome without diarrhea: Secondary | ICD-10-CM

## 2012-08-18 DIAGNOSIS — M1711 Unilateral primary osteoarthritis, right knee: Secondary | ICD-10-CM | POA: Insufficient documentation

## 2012-08-18 DIAGNOSIS — K5730 Diverticulosis of large intestine without perforation or abscess without bleeding: Secondary | ICD-10-CM | POA: Insufficient documentation

## 2012-08-18 NOTE — H&P (Signed)
TOTAL KNEE ADMISSION H&P  Patient is being admitted for right total knee arthroplasty.  Subjective:  Chief Complaint:right knee pain.  HPI: Carol Richmond, 62 y.o. female, has a history of pain and functional disability in the right knee due to arthritis and has failed non-surgical conservative treatments for greater than 12 weeks to includeNSAID's and/or analgesics, corticosteriod injections, viscosupplementation injections, flexibility and strengthening excercises, supervised PT with diminished ADL's post treatment, use of assistive devices, weight reduction as appropriate and activity modification.  Onset of symptoms was gradual, starting 6 years ago with gradually worsening course since that time. The patient noted no past surgery on the right knee(s).  Patient currently rates pain in the right knee(s) at 10 out of 10 with activity. Patient has night pain, worsening of pain with activity and weight bearing, pain that interferes with activities of daily living, crepitus and joint swelling.  Patient has evidence of subchondral sclerosis, periarticular osteophytes and joint space narrowing by imaging studies.  There is no active infection.  Patient Active Problem List   Diagnosis Date Noted  . Right knee DJD   . IBS (irritable bowel syndrome)   . Diverticulosis of colon (without mention of hemorrhage)   . GERD (gastroesophageal reflux disease)   . Hypertension   . Hormone replacement therapy (postmenopausal)   . Migraine   . Seizures   . Hemorrhoids, external, without mention of complication   . Hemorrhoids, external, without mention of complication   . SHOULDER PAIN 08/24/2008  . IMPINGEMENT SYNDROME 08/24/2008   Past Medical History  Diagnosis Date  . GERD (gastroesophageal reflux disease)   . Diarrhea   . Hypertension   . Hormone replacement therapy (postmenopausal)   . Migraine   . Seizures   . IBS (irritable bowel syndrome)   . Functional ovarian cysts   . Diverticulosis  of colon (without mention of hemorrhage)   . Hemorrhoids, external, without mention of complication   . Right knee DJD     Past Surgical History  Procedure Laterality Date  . Cholecystectomy  1980  . Vaginal hysterectomy  1990  . Tonsillectomy    . Oophorectomy      2012 Dr. Adah Perl in [place]     (Not in a hospital admission) No Known Allergies  Current Outpatient Prescriptions on File Prior to Visit  Medication Sig Dispense Refill  . lisinopril-hydrochlorothiazide (PRINZIDE,ZESTORETIC) 20-12.5 MG per tablet Take 1 tablet by mouth daily.        [name] LORazepam (ATIVAN PO) Take by mouth 2 (two) times daily.        [name] METFORMIN HCL PO Take 1,000 mg by mouth 2 (two) times daily before a meal.       . mometasone-formoterol (DULERA) 100-5 MCG/ACT AERO Inhale 2 puffs into the lungs 2 (two) times daily.      [name] omeprazole (PRILOSEC) 20 MG capsule Take 20 mg by mouth daily.      . simvastatin (ZOCOR) 20 MG tablet Take 20 mg by mouth every evening.      . lamoTRIgine (LAMICTAL) 200 MG tablet Take 200 mg by mouth 2 (two) times daily.        No current facility-administered medications on file prior to visit.   Meds ordered this encounter  Medications  . estradiol (ESTRACE) 0.5 MG tablet    Sig: Take 0.5 mg by mouth daily.  . valACYclovir (VALTREX) 500 MG tablet    Sig: Take 500 mg by mouth 2 (two) times daily.  [name] escitalopram (LEXAPRO)  20 MG tablet    Sig: Take 20 mg by mouth daily.    History  Substance Use Topics  . Smoking status: Never Smoker   . Smokeless tobacco: Never Used  . Alcohol Use: No    Family History  Problem Relation Age of Onset  . Heart disease Mother   . Diabetes Mother   . Heart disease Brother   . Cancer Sister   . Heart disease Brother   . Heart disease Brother      Review of Systems  Constitutional: Negative.   HENT: Negative.   Eyes: Positive for blurred vision. Negative for double vision, photophobia, pain, discharge and redness.  Respiratory:  Negative.   Cardiovascular: Negative.   Gastrointestinal: Negative.   Genitourinary: Negative.   Musculoskeletal: Positive for joint pain.  Skin: Negative.   Neurological: Negative.   Endo/Heme/Allergies: Negative.   Psychiatric/Behavioral: Negative.     Objective:  Physical Exam  Constitutional: She is oriented to person, place, and time. She appears well-developed and well-nourished.  HENT:  Head: Normocephalic and atraumatic.  Eyes: Conjunctivae are normal. Pupils are equal, round, and reactive to light.  Neck: Normal range of motion. Neck supple.  Cardiovascular: Normal rate and regular rhythm.   Respiratory: Effort normal and breath sounds normal.  GI: Soft. Bowel sounds are normal.  Genitourinary:  Not pertinent to current symptomatology therefore not examined.  Musculoskeletal:  Antalgic gait. Right knee has fairly good range of motion. Positive crepitus. 1+ effusion. Exquisite joint line tenderness. Ligaments stable. bilateral calves non-tender neurovascularly intact. Skin warm and dry.   Neurological: She is alert and oriented to person, place, and time.  Skin: Skin is warm and dry.  Psychiatric: She has a normal mood and affect. Her behavior is normal.   X-RAYS: Right knee AP lateral sunrise views show medial and lateral compartmental narrowing. Periarticular spurs. Moderate patellofemoral degenerative changes.   Vital signs in last 24 hrs Last recorded: 07/01 1100   BP: 115/75 Pulse: 95  Temp: 98.3 F (36.8 C)    Height: 5' 3"  (1.6 m) SpO2: 96  Weight: 84.823 kg (187 lb)    Labs:   Estimated body mass index is 33.13 kg/(m^2) as calculated from the following:   Height as of this encounter: 5' 3"  (1.6 m).   Weight as of this encounter: 84.823 kg (187 lb).   Imaging Review Plain radiographs demonstrate severe degenerative joint disease of the right knee(s). The overall alignment isneutral. The bone quality appears to be good for age and reported activity  level.  Assessment/Plan:  End stage arthritis, right knee   The patient history, physical examination, clinical judgment of the provider and imaging studies are consistent with end stage degenerative joint disease of the right knee(s) and total knee arthroplasty is deemed medically necessary. The treatment options including medical management, injection therapy arthroscopy and arthroplasty were discussed at length. The risks and benefits of total knee arthroplasty were presented and reviewed. The risks due to aseptic loosening, infection, stiffness, patella tracking problems, thromboembolic complications and other imponderables were discussed. The patient acknowledged the explanation, agreed to proceed with the plan and consent was signed. Patient is being admitted for inpatient treatment for surgery, pain control, PT, OT, prophylactic antibiotics, VTE prophylaxis, progressive ambulation and ADL's and discharge planning. The patient is planning to be discharged home with home health services  Claris Guymon A. Kaleen Mask Physician Assistant [facility] [phone]  08/18/2012, 11:49 AM

## 2012-08-24 ENCOUNTER — Encounter (HOSPITAL_COMMUNITY): Payer: Self-pay | Admitting: Pharmacy Technician

## 2012-08-27 ENCOUNTER — Encounter (HOSPITAL_COMMUNITY): Payer: Self-pay

## 2012-08-27 ENCOUNTER — Encounter (HOSPITAL_COMMUNITY)
Admission: RE | Admit: 2012-08-27 | Discharge: 2012-08-27 | Disposition: A | Discharge status: Home / Self Care | Payer: Medicare Other | Source: Ambulatory Visit | Attending: Orthopedic Surgery | Admitting: Orthopedic Surgery

## 2012-08-27 ENCOUNTER — Encounter (HOSPITAL_COMMUNITY)
Admission: RE | Admit: 2012-08-27 | Discharge: 2012-08-27 | Disposition: A | Discharge status: Home / Self Care | Payer: Medicare Other | Source: Ambulatory Visit | Attending: Physician Assistant | Admitting: Physician Assistant

## 2012-08-27 DIAGNOSIS — Z01818 Encounter for other preprocedural examination: Secondary | ICD-10-CM | POA: Insufficient documentation

## 2012-08-27 DIAGNOSIS — Z01812 Encounter for preprocedural laboratory examination: Secondary | ICD-10-CM | POA: Insufficient documentation

## 2012-08-27 DIAGNOSIS — Z01810 Encounter for preprocedural cardiovascular examination: Secondary | ICD-10-CM | POA: Insufficient documentation

## 2012-08-27 HISTORY — DX: Sleep apnea, unspecified: G47.30

## 2012-08-27 HISTORY — DX: Type 2 diabetes mellitus without complications: E11.9

## 2012-08-27 HISTORY — DX: Unspecified asthma, uncomplicated: J45.909

## 2012-08-27 HISTORY — DX: Shortness of breath: R06.02

## 2012-08-27 LAB — ABO/RH: ABO/RH(D): B POS

## 2012-08-27 LAB — COMPREHENSIVE METABOLIC PANEL
Albumin: 3.9 g/dL (ref 3.5–5.2)
BUN: 14 mg/dL (ref 6–23)
Calcium: 9.7 mg/dL (ref 8.4–10.5)
GFR calc Af Amer: 86 mL/min — ABNORMAL LOW (ref >90)
Glucose, Bld: 107 mg/dL — ABNORMAL HIGH (ref 70–99)
Sodium: 142 mEq/L (ref 135–145)
Total Protein: 7.6 g/dL (ref 6.0–8.3)

## 2012-08-27 LAB — TYPE AND SCREEN
ABO/RH(D): B POS
Antibody Screen: NEGATIVE

## 2012-08-27 LAB — CBC WITH DIFFERENTIAL/PLATELET
Basophils Relative: 0 % (ref 0–1)
Eosinophils Absolute: 0.2 10*3/uL (ref 0.0–0.7)
Eosinophils Relative: 2 % (ref 0–5)
Lymphs Abs: 3.6 10*3/uL (ref 0.7–4.0)
MCH: 30.3 pg (ref 26.0–34.0)
MCHC: 33.7 g/dL (ref 30.0–36.0)
MCV: 89.7 fL (ref 78.0–100.0)
Monocytes Relative: 14 % — ABNORMAL HIGH (ref 3–12)
Neutrophils Relative %: 53 % (ref 43–77)
Platelets: 217 10*3/uL (ref 150–400)
RBC: 3.9 MIL/uL (ref 3.87–5.11)

## 2012-08-27 LAB — URINALYSIS, ROUTINE W REFLEX MICROSCOPIC
Bilirubin Urine: NEGATIVE
Hgb urine dipstick: NEGATIVE
Ketones, ur: NEGATIVE mg/dL
Protein, ur: NEGATIVE mg/dL
Urobilinogen, UA: 0.2 mg/dL (ref 0.0–1.0)

## 2012-08-27 LAB — SURGICAL PCR SCREEN: Staphylococcus aureus: NEGATIVE

## 2012-08-27 LAB — PROTIME-INR: Prothrombin Time: 14 seconds (ref 11.6–15.2)

## 2012-08-27 MED ORDER — CHLORHEXIDINE GLUCONATE 4 % EX LIQD: 60.0000 mL | Freq: Once | CUTANEOUS | Status: DC

## 2012-08-27 MED ORDER — POVIDONE-IODINE 7.5 % EX SOLN: Freq: Once | CUTANEOUS | Status: DC

## 2012-08-27 NOTE — Pre-Procedure Instructions (Signed)
Carol Richmond  08/27/2012   Your procedure is scheduled on:  09/02/12  Report to [place] at 61 AM.  Call this number if you have problems the morning of surgery: [phone]   Remember:   Do not eat food or drink liquids after midnight.   Take these medicines the morning of surgery with A SIP OF WATER: lexapro,lamictal,ativan,prilosec   Do not wear jewelry, make-up or nail polish.  Do not wear lotions, powders, or perfumes. You may wear deodorant.  Do not shave 48 hours prior to surgery. Men may shave face and neck.  Do not bring valuables to the hospital.  [facility] is not responsible                   for any belongings or valuables.  Contacts, dentures or bridgework may not be worn into surgery.  Leave suitcase in the car. After surgery it may be brought to your room.  For patients admitted to the hospital, checkout time is 11:00 AM the day of  discharge.   Patients discharged the day of surgery will not be allowed to drive  home.  Name and phone number of your driver: family  Special Instructions: Shower using CHG 2 nights before surgery and the night before surgery.  If you shower the day of surgery use CHG.  Use special wash - you have one bottle of CHG for all showers.  You should use approximately 1/3 of the bottle for each shower.   Please read over the following fact sheets that you were given: Pain Booklet, Coughing and Deep Breathing, Blood Transfusion Information, MRSA Information and Surgical Site Infection Prevention

## 2012-08-28 LAB — URINE CULTURE: Colony Count: >=100000

## 2012-08-28 NOTE — Progress Notes (Addendum)
Anesthesia chart review: Patient is a 62 year old female scheduled for right TKR by Dr. Percell Miller on 09/02/2012. History includes nonsmoker, obesity, GERD, hypertension, migraines, seizures, IBS, ovarian cyst, diverticulosis, asthma, diabetes mellitus type 2, obstructive sleep apnea without CPAP use, cholecystectomy, tonsillectomy, hysterectomy. PCP is Dr. Stoney Bang who cleared her from a medical standpoint.    EKG on 08/27/12 showed NSR.  Echo on 05/08/07 showed:  - Overall left ventricular systolic function was normal. There were no left ventricular regional wall motion abnormalities. - There was mild rheumatic deformity of the mitral valve-the posterior leaflet was immobile. The anterior was mildly restricted. There was systolic mitral bowing of the anterior leaflet, but without diagnostic evidence for mitral valve prolapse. There was mild mitral valvular regurgitation. Mean transmitral gradient was 6 mmHg. Mitral valve area by pressure half-time was 2.72 cm^2. The effective orifice of mitral regurgitation by proximal isovelocity surface area was 0.04 cm^2. The volume of mitral regurgitation by proximal isovelocity surface area was 6 cc. - The left atrium was mildly dilated.  She had a cardiac cath for chest pain on 10/11/04 (Dr. Haroldine Laws) that showed minimal non-obstructive CAD in the major epicardial vessels. There was a high grade lesion in the ostium of the tiny D1 which was not amenable to percutaneous intervention and would likely not be the cause of her symptoms. Normal LV function, EF 60%.    CXR report on 08/27/12 showed: Prominence of reticular interstitial markings centrally with central peribronchial thickening. This may be associated with bronchitis, asthma, and reactive airway disease. No consolidation or pleural effusion. Chronic findings are detailed above.   CT of the abdomen/pelvis on 12/12/11 [facility]) showed normal hepatic contour, mild diffuse decreased attenuation of the  hepatic parenchyma suggestive of hepatic steatosis.  No discrete hepatic lesions.  No biliary ductal dilation.  Post cholecystectomy. Extensive diverticulosis. Atherosclerotic calcifications within a normal caliber abdominal aorta.  Preoperative labs noted. Cr 0.84, glucose 107, AST/ALT elevated at 124/77 (previously 72/58 on 07/02/12).  She denied ETOH use, no reported hepatitis history, but she is on statin therapy and had hepatic steatosis on CT lastyear. WBC 11.4, H/H 11.8/35.0, PLT 217K, PT/PTT WNL.  Urine culture pending. A1C was 6.4 on 07/02/12 (PCP office).  I reviewed labs with anesthesiologist Dr. Linna Caprice.  Patient has had a further increase in her AST/ALT.  Statin therapy and hepatic steatosis are likely playing at least some role.  Her coags and PLT count remain WNL.  She will need repeat HFP on the day of surgery to ensure stability/improvement.  If there is a significant increase in her AST/ALT then she would need further evaluation by GI; however, if labs are stable then it is anticipated that she could proceed as planned from an anesthesia standpoint.  I have left a message with Sherri at Dr. Debroah Loop office.   George Hugh [facility]/Anesthesiology Phone [phone] 08/28/2012 3:30 PM  Addendum: 08/31/12 12:28 PM I notified [place], PA-C regarding LFTs and positive urine culture.  She has notified Dr. Percell Miller.  As above, plan for HFP on the day of surgery.  Additional orders, if any, per Dr. Percell Miller.

## 2012-09-01 MED ORDER — TRANEXAMIC ACID 100 MG/ML IV SOLN
1000.0000 mg | INTRAVENOUS | Status: AC
  Administered 2012-09-02: 1000 mg via INTRAVENOUS
  Filled 2012-09-01: qty 10

## 2012-09-01 MED ORDER — CEFAZOLIN SODIUM-DEXTROSE 2-3 GM-% IV SOLR
2.0000 g | INTRAVENOUS | Status: AC
  Administered 2012-09-02: 2 g via INTRAVENOUS
  Filled 2012-09-01: qty 50

## 2012-09-02 ENCOUNTER — Encounter (HOSPITAL_COMMUNITY): Payer: Self-pay | Admitting: Vascular Surgery

## 2012-09-02 ENCOUNTER — Encounter (HOSPITAL_COMMUNITY): Payer: Self-pay | Admitting: Surgery

## 2012-09-02 ENCOUNTER — Ambulatory Visit (HOSPITAL_COMMUNITY): Payer: Medicare Other | Admitting: Certified Registered"

## 2012-09-02 ENCOUNTER — Inpatient Hospital Stay (HOSPITAL_COMMUNITY)
Admission: RE | Admit: 2012-09-02 | Discharge: 2012-09-05 | DRG: 470 | Disposition: A | Discharge status: Home Health | Payer: Medicare Other | Source: Ambulatory Visit | Attending: Orthopedic Surgery | Admitting: Orthopedic Surgery

## 2012-09-02 ENCOUNTER — Encounter (HOSPITAL_COMMUNITY)
Admission: RE | Disposition: A | Discharge status: Home Health | Payer: Self-pay | Source: Ambulatory Visit | Attending: Orthopedic Surgery

## 2012-09-02 ENCOUNTER — Inpatient Hospital Stay (HOSPITAL_COMMUNITY): Payer: Medicare Other

## 2012-09-02 DIAGNOSIS — K219 Gastro-esophageal reflux disease without esophagitis: Secondary | ICD-10-CM | POA: Diagnosis present

## 2012-09-02 DIAGNOSIS — J45909 Unspecified asthma, uncomplicated: Secondary | ICD-10-CM | POA: Diagnosis present

## 2012-09-02 DIAGNOSIS — I10 Essential (primary) hypertension: Secondary | ICD-10-CM | POA: Diagnosis present

## 2012-09-02 DIAGNOSIS — M1711 Unilateral primary osteoarthritis, right knee: Secondary | ICD-10-CM | POA: Diagnosis present

## 2012-09-02 DIAGNOSIS — Z79890 Hormone replacement therapy (postmenopausal): Secondary | ICD-10-CM

## 2012-09-02 DIAGNOSIS — K5730 Diverticulosis of large intestine without perforation or abscess without bleeding: Secondary | ICD-10-CM | POA: Diagnosis present

## 2012-09-02 DIAGNOSIS — Z7901 Long term (current) use of anticoagulants: Secondary | ICD-10-CM

## 2012-09-02 DIAGNOSIS — K589 Irritable bowel syndrome without diarrhea: Secondary | ICD-10-CM | POA: Diagnosis present

## 2012-09-02 DIAGNOSIS — Z79899 Other long term (current) drug therapy: Secondary | ICD-10-CM

## 2012-09-02 DIAGNOSIS — Z9089 Acquired absence of other organs: Secondary | ICD-10-CM

## 2012-09-02 DIAGNOSIS — E119 Type 2 diabetes mellitus without complications: Secondary | ICD-10-CM | POA: Diagnosis present

## 2012-09-02 DIAGNOSIS — K644 Residual hemorrhoidal skin tags: Secondary | ICD-10-CM | POA: Diagnosis present

## 2012-09-02 DIAGNOSIS — G4730 Sleep apnea, unspecified: Secondary | ICD-10-CM | POA: Diagnosis present

## 2012-09-02 DIAGNOSIS — M1710 Unilateral primary osteoarthritis, unspecified knee: Principal | ICD-10-CM | POA: Diagnosis present

## 2012-09-02 DIAGNOSIS — Z833 Family history of diabetes mellitus: Secondary | ICD-10-CM

## 2012-09-02 DIAGNOSIS — Z780 Asymptomatic menopausal state: Secondary | ICD-10-CM

## 2012-09-02 DIAGNOSIS — Z8249 Family history of ischemic heart disease and other diseases of the circulatory system: Secondary | ICD-10-CM

## 2012-09-02 HISTORY — PX: TOTAL KNEE ARTHROPLASTY: SHX125

## 2012-09-02 LAB — CBC
Hemoglobin: 11 g/dL — ABNORMAL LOW (ref 12.0–15.0)
MCH: 30.8 pg (ref 26.0–34.0)
MCHC: 34.7 g/dL (ref 30.0–36.0)
MCV: 88.8 fL (ref 78.0–100.0)
Platelets: 202 10*3/uL (ref 150–400)
RBC: 3.57 MIL/uL — ABNORMAL LOW (ref 3.87–5.11)

## 2012-09-02 LAB — GLUCOSE, CAPILLARY: Glucose-Capillary: 121 mg/dL — ABNORMAL HIGH (ref 70–99)

## 2012-09-02 LAB — HEPATIC FUNCTION PANEL
Albumin: 3.8 g/dL (ref 3.5–5.2)
Total Bilirubin: 0.2 mg/dL — ABNORMAL LOW (ref 0.3–1.2)
Total Protein: 7.5 g/dL (ref 6.0–8.3)

## 2012-09-02 LAB — CREATININE, SERUM: Creatinine, Ser: 0.74 mg/dL (ref 0.50–1.10)

## 2012-09-02 SURGERY — ARTHROPLASTY, KNEE, TOTAL
Anesthesia: General | Site: Knee | Laterality: Right | Wound class: Clean

## 2012-09-02 MED ORDER — ONDANSETRON HCL 4 MG/2ML IJ SOLN: 4.0000 mg | Freq: Four times a day (QID) | INTRAMUSCULAR | Status: DC | PRN

## 2012-09-02 MED ORDER — PHENOL 1.4 % MT LIQD: 1.0000 | OROMUCOSAL | Status: DC | PRN

## 2012-09-02 MED ORDER — LACTATED RINGERS IV SOLN
INTRAVENOUS | Status: DC
  Administered 2012-09-02 (×2): via INTRAVENOUS

## 2012-09-02 MED ORDER — ARTIFICIAL TEARS OP OINT
TOPICAL_OINTMENT | OPHTHALMIC | Status: DC | PRN
  Administered 2012-09-02: 1 via OPHTHALMIC

## 2012-09-02 MED ORDER — FENTANYL CITRATE 0.05 MG/ML IJ SOLN
INTRAMUSCULAR | Status: DC | PRN
  Administered 2012-09-02: 25 ug via INTRAVENOUS
  Administered 2012-09-02 (×2): 50 ug via INTRAVENOUS
  Administered 2012-09-02: 25 ug via INTRAVENOUS
  Administered 2012-09-02: 100 ug via INTRAVENOUS
  Administered 2012-09-02: 25 ug via INTRAVENOUS
  Administered 2012-09-02: 50 ug via INTRAVENOUS
  Administered 2012-09-02: 25 ug via INTRAVENOUS
  Administered 2012-09-02: 150 ug via INTRAVENOUS

## 2012-09-02 MED ORDER — ACETAMINOPHEN 325 MG PO TABS: 650.0000 mg | ORAL_TABLET | Freq: Four times a day (QID) | ORAL | Status: DC | PRN

## 2012-09-02 MED ORDER — PANTOPRAZOLE SODIUM 40 MG PO TBEC
40.0000 mg | DELAYED_RELEASE_TABLET | Freq: Every day | ORAL | Status: DC
  Administered 2012-09-03 – 2012-09-05 (×3): 40 mg via ORAL
  Filled 2012-09-02 (×2): qty 1

## 2012-09-02 MED ORDER — LISINOPRIL-HYDROCHLOROTHIAZIDE 20-12.5 MG PO TABS: 1.0000 | ORAL_TABLET | Freq: Every day | ORAL | Status: DC

## 2012-09-02 MED ORDER — MOMETASONE FURO-FORMOTEROL FUM 100-5 MCG/ACT IN AERO
2.0000 | INHALATION_SPRAY | Freq: Two times a day (BID) | RESPIRATORY_TRACT | Status: DC
  Administered 2012-09-02 – 2012-09-05 (×6): 2 via RESPIRATORY_TRACT
  Filled 2012-09-02 (×2): qty 8.8

## 2012-09-02 MED ORDER — BUPIVACAINE LIPOSOME 1.3 % IJ SUSP
20.0000 mL | INTRAMUSCULAR | Status: DC
  Filled 2012-09-02: qty 20

## 2012-09-02 MED ORDER — PROMETHAZINE HCL 25 MG/ML IJ SOLN: 6.2500 mg | INTRAMUSCULAR | Status: DC | PRN

## 2012-09-02 MED ORDER — ESCITALOPRAM OXALATE 20 MG PO TABS
20.0000 mg | ORAL_TABLET | Freq: Every day | ORAL | Status: DC
  Administered 2012-09-03 – 2012-09-05 (×3): 20 mg via ORAL
  Filled 2012-09-02 (×4): qty 1

## 2012-09-02 MED ORDER — INSULIN ASPART 100 UNIT/ML ~~LOC~~ SOLN
0.0000 [IU] | Freq: Three times a day (TID) | SUBCUTANEOUS | Status: DC
  Administered 2012-09-03: 2 [IU] via SUBCUTANEOUS
  Administered 2012-09-03 – 2012-09-04 (×5): 3 [IU] via SUBCUTANEOUS
  Administered 2012-09-05: 2 [IU] via SUBCUTANEOUS

## 2012-09-02 MED ORDER — METOCLOPRAMIDE HCL 5 MG/ML IJ SOLN: 5.0000 mg | Freq: Three times a day (TID) | INTRAMUSCULAR | Status: DC | PRN

## 2012-09-02 MED ORDER — ENOXAPARIN SODIUM 30 MG/0.3ML ~~LOC~~ SOLN
30.0000 mg | SUBCUTANEOUS | Status: DC
  Administered 2012-09-03 – 2012-09-05 (×3): 30 mg via SUBCUTANEOUS
  Filled 2012-09-02 (×4): qty 0.3

## 2012-09-02 MED ORDER — LIDOCAINE HCL (CARDIAC) 20 MG/ML IV SOLN
INTRAVENOUS | Status: DC | PRN
  Administered 2012-09-02: 80 mg via INTRAVENOUS

## 2012-09-02 MED ORDER — ALUM & MAG HYDROXIDE-SIMETH 200-200-20 MG/5ML PO SUSP: 30.0000 mL | ORAL | Status: DC | PRN

## 2012-09-02 MED ORDER — BUPIVACAINE HCL (PF) 0.25 % IJ SOLN
INTRAMUSCULAR | Status: AC
  Filled 2012-09-02: qty 30

## 2012-09-02 MED ORDER — CEFAZOLIN SODIUM 1-5 GM-% IV SOLN
1.0000 g | Freq: Four times a day (QID) | INTRAVENOUS | Status: AC
  Administered 2012-09-02 (×2): 1 g via INTRAVENOUS
  Filled 2012-09-02 (×2): qty 50

## 2012-09-02 MED ORDER — DIPHENHYDRAMINE HCL 12.5 MG/5ML PO ELIX: 12.5000 mg | ORAL_SOLUTION | ORAL | Status: DC | PRN

## 2012-09-02 MED ORDER — SODIUM CHLORIDE 0.9 % IJ SOLN
INTRAMUSCULAR | Status: DC | PRN
  Administered 2012-09-02: 40 mL

## 2012-09-02 MED ORDER — ONDANSETRON HCL 4 MG PO TABS
4.0000 mg | ORAL_TABLET | Freq: Four times a day (QID) | ORAL | Status: DC | PRN
  Administered 2012-09-02: 4 mg via ORAL
  Filled 2012-09-02: qty 1

## 2012-09-02 MED ORDER — OXYCODONE HCL 5 MG PO TABS
ORAL_TABLET | ORAL | Status: AC
  Filled 2012-09-02: qty 2

## 2012-09-02 MED ORDER — VALACYCLOVIR HCL 500 MG PO TABS
500.0000 mg | ORAL_TABLET | Freq: Two times a day (BID) | ORAL | Status: DC
  Administered 2012-09-02 – 2012-09-05 (×6): 500 mg via ORAL
  Filled 2012-09-02 (×7): qty 1

## 2012-09-02 MED ORDER — WARFARIN VIDEO: Freq: Once | Status: DC

## 2012-09-02 MED ORDER — LISINOPRIL 20 MG PO TABS
20.0000 mg | ORAL_TABLET | Freq: Every day | ORAL | Status: DC
  Administered 2012-09-03 – 2012-09-05 (×3): 20 mg via ORAL
  Filled 2012-09-02 (×4): qty 1

## 2012-09-02 MED ORDER — MENTHOL 3 MG MT LOZG: 1.0000 | LOZENGE | OROMUCOSAL | Status: DC | PRN

## 2012-09-02 MED ORDER — LAMOTRIGINE 200 MG PO TABS
200.0000 mg | ORAL_TABLET | Freq: Two times a day (BID) | ORAL | Status: DC
  Administered 2012-09-02 – 2012-09-05 (×6): 200 mg via ORAL
  Filled 2012-09-02 (×8): qty 1

## 2012-09-02 MED ORDER — GLYCOPYRROLATE 0.2 MG/ML IJ SOLN
INTRAMUSCULAR | Status: DC | PRN
  Administered 2012-09-02: 0.6 mg via INTRAVENOUS

## 2012-09-02 MED ORDER — HYDROMORPHONE HCL PF 1 MG/ML IJ SOLN
0.5000 mg | INTRAMUSCULAR | Status: DC | PRN
  Administered 2012-09-02 – 2012-09-03 (×5): 0.5 mg via INTRAVENOUS
  Filled 2012-09-02 (×6): qty 1

## 2012-09-02 MED ORDER — BUPIVACAINE HCL (PF) 0.25 % IJ SOLN
INTRAMUSCULAR | Status: DC | PRN
  Administered 2012-09-02: 10 mL

## 2012-09-02 MED ORDER — BISACODYL 5 MG PO TBEC: 5.0000 mg | DELAYED_RELEASE_TABLET | Freq: Every day | ORAL | Status: DC | PRN

## 2012-09-02 MED ORDER — ACETAMINOPHEN 650 MG RE SUPP: 650.0000 mg | Freq: Four times a day (QID) | RECTAL | Status: DC | PRN

## 2012-09-02 MED ORDER — MIDAZOLAM HCL 5 MG/5ML IJ SOLN
INTRAMUSCULAR | Status: DC | PRN
  Administered 2012-09-02: 2 mg via INTRAVENOUS

## 2012-09-02 MED ORDER — SIMVASTATIN 20 MG PO TABS
20.0000 mg | ORAL_TABLET | Freq: Every evening | ORAL | Status: DC
  Administered 2012-09-02 – 2012-09-04 (×3): 20 mg via ORAL
  Filled 2012-09-02 (×4): qty 1

## 2012-09-02 MED ORDER — METOCLOPRAMIDE HCL 5 MG PO TABS
5.0000 mg | ORAL_TABLET | Freq: Three times a day (TID) | ORAL | Status: DC | PRN
  Filled 2012-09-02: qty 2

## 2012-09-02 MED ORDER — POTASSIUM CHLORIDE IN NACL 20-0.9 MEQ/L-% IV SOLN
INTRAVENOUS | Status: DC
  Administered 2012-09-02 – 2012-09-03 (×2): via INTRAVENOUS
  Filled 2012-09-02 (×8): qty 1000

## 2012-09-02 MED ORDER — LORAZEPAM 1 MG PO TABS
2.0000 mg | ORAL_TABLET | Freq: Two times a day (BID) | ORAL | Status: DC
  Administered 2012-09-02 – 2012-09-05 (×6): 2 mg via ORAL
  Filled 2012-09-02 (×3): qty 2
  Filled 2012-09-02: qty 1
  Filled 2012-09-02 (×2): qty 2

## 2012-09-02 MED ORDER — DOCUSATE SODIUM 100 MG PO CAPS
100.0000 mg | ORAL_CAPSULE | Freq: Two times a day (BID) | ORAL | Status: DC
  Administered 2012-09-02 – 2012-09-05 (×6): 100 mg via ORAL
  Filled 2012-09-02 (×7): qty 1

## 2012-09-02 MED ORDER — COUMADIN BOOK
Freq: Once | Status: DC
  Filled 2012-09-02: qty 1

## 2012-09-02 MED ORDER — BUPIVACAINE LIPOSOME 1.3 % IJ SUSP
INTRAMUSCULAR | Status: DC | PRN
  Administered 2012-09-02: 20 mL

## 2012-09-02 MED ORDER — ONDANSETRON HCL 4 MG/2ML IJ SOLN
INTRAMUSCULAR | Status: DC | PRN
  Administered 2012-09-02: 4 mg via INTRAVENOUS

## 2012-09-02 MED ORDER — OXYCODONE HCL 5 MG PO TABS
5.0000 mg | ORAL_TABLET | ORAL | Status: DC | PRN
  Administered 2012-09-02 – 2012-09-05 (×9): 10 mg via ORAL
  Filled 2012-09-02 (×8): qty 2

## 2012-09-02 MED ORDER — PROPOFOL 10 MG/ML IV BOLUS
INTRAVENOUS | Status: DC | PRN
  Administered 2012-09-02: 200 mg via INTRAVENOUS

## 2012-09-02 MED ORDER — OXYCODONE HCL 5 MG/5ML PO SOLN: 5.0000 mg | Freq: Once | ORAL | Status: DC | PRN

## 2012-09-02 MED ORDER — HYDROMORPHONE HCL PF 1 MG/ML IJ SOLN
0.2500 mg | INTRAMUSCULAR | Status: DC | PRN
  Administered 2012-09-02 (×2): 0.5 mg via INTRAVENOUS

## 2012-09-02 MED ORDER — INSULIN ASPART 100 UNIT/ML ~~LOC~~ SOLN: 0.0000 [IU] | Freq: Every day | SUBCUTANEOUS | Status: DC

## 2012-09-02 MED ORDER — NEOSTIGMINE METHYLSULFATE 1 MG/ML IJ SOLN
INTRAMUSCULAR | Status: DC | PRN
  Administered 2012-09-02: 4 mg via INTRAVENOUS

## 2012-09-02 MED ORDER — ROCURONIUM BROMIDE 100 MG/10ML IV SOLN
INTRAVENOUS | Status: DC | PRN
  Administered 2012-09-02: 50 mg via INTRAVENOUS

## 2012-09-02 MED ORDER — WARFARIN - PHARMACIST DOSING INPATIENT: Freq: Every day | Status: DC

## 2012-09-02 MED ORDER — SODIUM CHLORIDE 0.9 % IR SOLN
Status: DC | PRN
  Administered 2012-09-02: 3000 mL
  Administered 2012-09-02: 1000 mL

## 2012-09-02 MED ORDER — OXYCODONE-ACETAMINOPHEN 5-325 MG PO TABS
1.0000 | ORAL_TABLET | ORAL | Status: DC | PRN
  Administered 2012-09-02 – 2012-09-05 (×5): 2 via ORAL
  Filled 2012-09-02 (×5): qty 2

## 2012-09-02 MED ORDER — METFORMIN HCL 500 MG PO TABS
1000.0000 mg | ORAL_TABLET | Freq: Two times a day (BID) | ORAL | Status: DC
  Administered 2012-09-03 – 2012-09-05 (×5): 1000 mg via ORAL
  Filled 2012-09-02 (×7): qty 2

## 2012-09-02 MED ORDER — OXYCODONE HCL 5 MG PO TABS: 5.0000 mg | ORAL_TABLET | Freq: Once | ORAL | Status: DC | PRN

## 2012-09-02 MED ORDER — HYDROCHLOROTHIAZIDE 12.5 MG PO CAPS
12.5000 mg | ORAL_CAPSULE | Freq: Every day | ORAL | Status: DC
  Administered 2012-09-03 – 2012-09-05 (×3): 12.5 mg via ORAL
  Filled 2012-09-02 (×4): qty 1

## 2012-09-02 MED ORDER — WARFARIN SODIUM 5 MG PO TABS
5.0000 mg | ORAL_TABLET | Freq: Once | ORAL | Status: AC
  Administered 2012-09-02: 5 mg via ORAL
  Filled 2012-09-02: qty 1

## 2012-09-02 MED ORDER — HYDROMORPHONE HCL PF 1 MG/ML IJ SOLN
INTRAMUSCULAR | Status: AC
  Filled 2012-09-02: qty 1

## 2012-09-02 MED ORDER — INSULIN ASPART 100 UNIT/ML ~~LOC~~ SOLN
3.0000 [IU] | Freq: Three times a day (TID) | SUBCUTANEOUS | Status: DC
Start: 2012-09-03 — End: 2012-09-05
  Administered 2012-09-03 – 2012-09-05 (×5): 3 [IU] via SUBCUTANEOUS

## 2012-09-02 SURGICAL SUPPLY — 62 items
BANDAGE ESMARK 6X9 LF (GAUZE/BANDAGES/DRESSINGS) ×1 IMPLANT
BLADE SAG 18X100X1.27 (BLADE) ×4 IMPLANT
BNDG CMPR 9X6 STRL LF SNTH (GAUZE/BANDAGES/DRESSINGS) ×1
BNDG ESMARK 6X9 LF (GAUZE/BANDAGES/DRESSINGS) ×2
BOWL SMART MIX CTS (DISPOSABLE) ×2 IMPLANT
CEMENT BONE SIMPLEX SPEEDSET (Cement) ×4 IMPLANT
CLOTH BEACON ORANGE TIMEOUT ST (SAFETY) ×2 IMPLANT
COVER SURGICAL LIGHT HANDLE (MISCELLANEOUS) ×2 IMPLANT
CUFF TOURNIQUET SINGLE 34IN LL (TOURNIQUET CUFF) ×2 IMPLANT
DRAPE EXTREMITY T 121X128X90 (DRAPE) ×2 IMPLANT
DRAPE PROXIMA HALF (DRAPES) ×2 IMPLANT
DRAPE U-SHAPE 47X51 STRL (DRAPES) ×2 IMPLANT
DRSG PAD ABDOMINAL 8X10 ST (GAUZE/BANDAGES/DRESSINGS) ×2 IMPLANT
DURAPREP 26ML APPLICATOR (WOUND CARE) ×3 IMPLANT
ELECT CAUTERY BLADE 6.4 (BLADE) ×2 IMPLANT
ELECT REM PT RETURN 9FT ADLT (ELECTROSURGICAL) ×2
ELECTRODE REM PT RTRN 9FT ADLT (ELECTROSURGICAL) ×1 IMPLANT
EVACUATOR 1/8 PVC DRAIN (DRAIN) ×2 IMPLANT
FACESHIELD LNG OPTICON STERILE (SAFETY) ×2 IMPLANT
GAUZE XEROFORM 5X9 LF (GAUZE/BANDAGES/DRESSINGS) ×2 IMPLANT
GLOVE BIOGEL PI IND STRL 8 (GLOVE) IMPLANT
GLOVE BIOGEL PI INDICATOR 8 (GLOVE) ×1
GLOVE ORTHO TXT STRL SZ7.5 (GLOVE) ×2 IMPLANT
GLOVE SURG SS PI 7.5 STRL IVOR (GLOVE) ×1 IMPLANT
GOWN PREVENTION PLUS XLARGE (GOWN DISPOSABLE) ×3 IMPLANT
GOWN PREVENTION PLUS XXLARGE (GOWN DISPOSABLE) ×1 IMPLANT
GOWN STRL NON-REIN LRG LVL3 (GOWN DISPOSABLE) ×3 IMPLANT
GOWN STRL REIN 2XL XLG LVL4 (GOWN DISPOSABLE) ×2 IMPLANT
HANDPIECE INTERPULSE COAX TIP (DISPOSABLE) ×2
IMMOBILIZER KNEE 22 UNIV (SOFTGOODS) ×2 IMPLANT
IMMOBILIZER KNEE 24 THIGH 36 (MISCELLANEOUS) IMPLANT
IMMOBILIZER KNEE 24 UNIV (MISCELLANEOUS)
KIT BASIN OR (CUSTOM PROCEDURE TRAY) ×2 IMPLANT
KIT ROOM TURNOVER OR (KITS) ×2 IMPLANT
KNEE ORTHOSENSOR TRIATH SZ 4 (DISPOSABLE) ×1 IMPLANT
KNEE/VIT E POLY LINER LEVEL 1B ×1 IMPLANT
MANIFOLD NEPTUNE II (INSTRUMENTS) ×2 IMPLANT
NDL 18GX1X1/2 (RX/OR ONLY) (NEEDLE) ×1 IMPLANT
NDL HYPO 25GX1X1/2 BEV (NEEDLE) ×1 IMPLANT
NEEDLE 18GX1X1/2 (RX/OR ONLY) (NEEDLE) ×2 IMPLANT
NEEDLE HYPO 25GX1X1/2 BEV (NEEDLE) ×2 IMPLANT
NS IRRIG 1000ML POUR BTL (IV SOLUTION) ×2 IMPLANT
PACK TOTAL JOINT (CUSTOM PROCEDURE TRAY) ×2 IMPLANT
PAD ARMBOARD 7.5X6 YLW CONV (MISCELLANEOUS) ×3 IMPLANT
PAD CAST 4YDX4 CTTN HI CHSV (CAST SUPPLIES) ×1 IMPLANT
PADDING CAST COTTON 4X4 STRL (CAST SUPPLIES) ×2
PADDING CAST COTTON 6X4 STRL (CAST SUPPLIES) ×2 IMPLANT
RUBBERBAND STERILE (MISCELLANEOUS) ×2 IMPLANT
SET HNDPC FAN SPRY TIP SCT (DISPOSABLE) ×1 IMPLANT
SPONGE GAUZE 4X4 12PLY (GAUZE/BANDAGES/DRESSINGS) ×2 IMPLANT
STAPLER VISISTAT 35W (STAPLE) ×2 IMPLANT
SUCTION FRAZIER TIP 10 FR DISP (SUCTIONS) ×2 IMPLANT
SUT VIC AB 1 CTX 36 (SUTURE) ×4
SUT VIC AB 1 CTX36XBRD ANBCTR (SUTURE) ×2 IMPLANT
SUT VIC AB 2-0 CT1 27 (SUTURE) ×6
SUT VIC AB 2-0 CT1 TAPERPNT 27 (SUTURE) ×2 IMPLANT
SYR CONTROL 10ML LL (SYRINGE) ×2 IMPLANT
TOWEL OR 17X24 6PK STRL BLUE (TOWEL DISPOSABLE) ×2 IMPLANT
TOWEL OR 17X26 10 PK STRL BLUE (TOWEL DISPOSABLE) ×2 IMPLANT
TRAY FOLEY CATH 14FR (SET/KITS/TRAYS/PACK) ×1 IMPLANT
TRAY FOLEY CATH 16FRSI W/METER (SET/KITS/TRAYS/PACK) ×1 IMPLANT
WATER STERILE IRR 1000ML POUR (IV SOLUTION) ×4 IMPLANT

## 2012-09-02 NOTE — Progress Notes (Signed)
ANTICOAGULATION CONSULT NOTE - Initial Consult  Pharmacy Consult for Coumadin Indication: VTE prophylaxis  No Known Allergies  Patient Measurements:  Weight: 85.4kg Heparin Dosing Weight:   Vital Signs: Temp: 98.4 F (36.9 C) (07/16 1755) Temp src: Oral (07/16 1755) BP: 105/64 mmHg (07/16 1755) Pulse Rate: 93 (07/16 1755)  Labs:  Recent Labs  09/02/12 1830  HGB 11.0*  HCT 31.7*  PLT 202  CREATININE 0.74    The CrCl is unknown because both a height and weight (above a minimum accepted value) are required for this calculation.   Medical History: Past Medical History  Diagnosis Date  . GERD (gastroesophageal reflux disease)   . Diarrhea   . Hypertension   . Hormone replacement therapy (postmenopausal)   . Migraine   . Seizures   . IBS (irritable bowel syndrome)   . Functional ovarian cysts   . Diverticulosis of colon (without mention of hemorrhage)   . Hemorrhoids, external, without mention of complication   . Right knee DJD   . Shortness of breath   . Asthma   . Diabetes mellitus without complication   . Sleep apnea     no cpap    study 2 yrs ago    Medications:  Prescriptions prior to admission  Medication Sig Dispense Refill  . cholecalciferol (VITAMIN D) 1000 UNITS tablet Take 1,000 Units by mouth daily.      [name] escitalopram (LEXAPRO) 20 MG tablet Take 20 mg by mouth daily with breakfast.       . estradiol (ESTRACE) 0.5 MG tablet Take 0.5 mg by mouth daily.      [name] lamoTRIgine (LAMICTAL) 200 MG tablet Take 200 mg by mouth 2 (two) times daily.       [name] lisinopril-hydrochlorothiazide (PRINZIDE,ZESTORETIC) 20-12.5 MG per tablet Take 1 tablet by mouth daily.        [name] LORazepam (ATIVAN) 2 MG tablet Take 2 mg by mouth 2 (two) times daily.      [name] METFORMIN HCL PO Take 1,000 mg by mouth 2 (two) times daily before a meal.       . mometasone-formoterol (DULERA) 100-5 MCG/ACT AERO Inhale 2 puffs into the lungs 2 (two) times daily.      [name] omeprazole (PRILOSEC) 20 MG  capsule Take 20 mg by mouth daily.      . simvastatin (ZOCOR) 20 MG tablet Take 20 mg by mouth every evening.      . valACYclovir (VALTREX) 500 MG tablet Take 500 mg by mouth 2 (two) times daily.        Assessment: 62yof to start Coumadin for VTE prophylaxis s/p TKA. Patient is also receiving Lovenox 73m q12h until INR > 1.8 per MD orders.  - Baseline INR: 1.1 - Hg 11, Plts 202 - AST/ALT elevated (89/63) but trending down from 7/10 labs - Coumadin points: 3  Goal of Therapy:  INR 2-3   Plan:  1. Coumadin 521mpo x 1 today 2. Daily INR 3. Coumadin educational book/video  DuEarleen Newport1712-1975/16/2014,8:08 PM

## 2012-09-02 NOTE — Anesthesia Preprocedure Evaluation (Addendum)
Anesthesia Evaluation  Patient identified by MRN, date of birth, ID band Patient awake    Reviewed: Allergy & Precautions, H&P , NPO status , Patient's Chart, lab work & pertinent test results  Airway Mallampati: II TM Distance: <3 FB Neck ROM: Full    Dental  (+) Edentulous Upper, Edentulous Lower and Dental Advisory Given   Pulmonary shortness of breath and with exertion, asthma , sleep apnea ,  breath sounds clear to auscultation        Cardiovascular hypertension, Pt. on medications Rhythm:Regular Rate:Normal     Neuro/Psych  Headaches, Seizures -, Well Controlled,     GI/Hepatic GERD-  Medicated and Controlled,  Endo/Other  diabetes, Type 2, Oral Hypoglycemic Agents  Renal/GU      Musculoskeletal   Abdominal (+) + obese,   Peds  Hematology   Anesthesia Other Findings   Reproductive/Obstetrics                          Anesthesia Physical Anesthesia Plan  ASA: III  Anesthesia Plan: General   Post-op Pain Management:    Induction: Intravenous  Airway Management Planned: Oral ETT  Additional Equipment:   Intra-op Plan:   Post-operative Plan: Extubation in OR  Informed Consent:   Dental advisory given  Plan Discussed with: CRNA and Surgeon  Anesthesia Plan Comments:         Anesthesia Quick Evaluation

## 2012-09-02 NOTE — Progress Notes (Signed)
Orthopedic Tech Progress Note Patient Details:  Carol Richmond 1950/05/24 [id number]  CPM Right Knee CPM Right Knee: On Right Knee Flexion (Degrees): 60 Right Knee Extension (Degrees): 0   Cammer, Theodoro Parma 09/02/2012, 2:37 PM

## 2012-09-02 NOTE — Transfer of Care (Signed)
Immediate Anesthesia Transfer of Care Note  Patient: Carol Richmond  Procedure(s) Performed: Procedure(s): TOTAL KNEE ARTHROPLASTY- right (Right)  Patient Location: PACU  Anesthesia Type:General  Level of Consciousness: lethargic and responds to stimulation  Airway & Oxygen Therapy: Patient Spontanous Breathing and Patient connected to nasal cannula oxygen  Post-op Assessment: Report given to PACU RN  Post vital signs: Reviewed and stable  Complications: No apparent anesthesia complications

## 2012-09-02 NOTE — Anesthesia Procedure Notes (Signed)
Procedure Name: Intubation Date/Time: 09/02/2012 11:45 AM Performed by: Sampson Si E Pre-anesthesia Checklist: Patient identified, Timeout performed, Emergency Drugs available, Suction available and Patient being monitored Patient Re-evaluated:Patient Re-evaluated prior to inductionOxygen Delivery Method: Circle system utilized Preoxygenation: Pre-oxygenation with 100% oxygen Intubation Type: IV induction Ventilation: Mask ventilation without difficulty and Oral airway inserted - appropriate to patient size Laryngoscope Size: Mac and 3 Grade View: Grade I Tube type: Oral Tube size: 7.0 mm Number of attempts: 1 Airway Equipment and Method: Stylet Placement Confirmation: ETT inserted through vocal cords under direct vision,  positive ETCO2 and breath sounds checked- equal and bilateral Secured at: 22 cm Tube secured with: Tape Dental Injury: Teeth and Oropharynx as per pre-operative assessment

## 2012-09-02 NOTE — H&P (View-Only) (Signed)
TOTAL KNEE ADMISSION H&P  Patient is being admitted for right total knee arthroplasty.  Subjective:  Chief Complaint:right knee pain.  HPI: Carol Richmond, 62 y.o. female, has a history of pain and functional disability in the right knee due to arthritis and has failed non-surgical conservative treatments for greater than 12 weeks to includeNSAID's and/or analgesics, corticosteriod injections, viscosupplementation injections, flexibility and strengthening excercises, supervised PT with diminished ADL's post treatment, use of assistive devices, weight reduction as appropriate and activity modification.  Onset of symptoms was gradual, starting 6 years ago with gradually worsening course since that time. The patient noted no past surgery on the right knee(s).  Patient currently rates pain in the right knee(s) at 10 out of 10 with activity. Patient has night pain, worsening of pain with activity and weight bearing, pain that interferes with activities of daily living, crepitus and joint swelling.  Patient has evidence of subchondral sclerosis, periarticular osteophytes and joint space narrowing by imaging studies.  There is no active infection.  Patient Active Problem List   Diagnosis Date Noted  . Right knee DJD   . IBS (irritable bowel syndrome)   . Diverticulosis of colon (without mention of hemorrhage)   . GERD (gastroesophageal reflux disease)   . Hypertension   . Hormone replacement therapy (postmenopausal)   . Migraine   . Seizures   . Hemorrhoids, external, without mention of complication   . Hemorrhoids, external, without mention of complication   . SHOULDER PAIN 08/24/2008  . IMPINGEMENT SYNDROME 08/24/2008   Past Medical History  Diagnosis Date  . GERD (gastroesophageal reflux disease)   . Diarrhea   . Hypertension   . Hormone replacement therapy (postmenopausal)   . Migraine   . Seizures   . IBS (irritable bowel syndrome)   . Functional ovarian cysts   . Diverticulosis  of colon (without mention of hemorrhage)   . Hemorrhoids, external, without mention of complication   . Right knee DJD     Past Surgical History  Procedure Laterality Date  . Cholecystectomy  1980  . Vaginal hysterectomy  1990  . Tonsillectomy    . Oophorectomy      2012 Dr. Adah Perl in [place]     (Not in a hospital admission) No Known Allergies  Current Outpatient Prescriptions on File Prior to Visit  Medication Sig Dispense Refill  . lisinopril-hydrochlorothiazide (PRINZIDE,ZESTORETIC) 20-12.5 MG per tablet Take 1 tablet by mouth daily.        [name] LORazepam (ATIVAN PO) Take by mouth 2 (two) times daily.        [name] METFORMIN HCL PO Take 1,000 mg by mouth 2 (two) times daily before a meal.       . mometasone-formoterol (DULERA) 100-5 MCG/ACT AERO Inhale 2 puffs into the lungs 2 (two) times daily.      [name] omeprazole (PRILOSEC) 20 MG capsule Take 20 mg by mouth daily.      . simvastatin (ZOCOR) 20 MG tablet Take 20 mg by mouth every evening.      . lamoTRIgine (LAMICTAL) 200 MG tablet Take 200 mg by mouth 2 (two) times daily.        No current facility-administered medications on file prior to visit.   Meds ordered this encounter  Medications  . estradiol (ESTRACE) 0.5 MG tablet    Sig: Take 0.5 mg by mouth daily.  . valACYclovir (VALTREX) 500 MG tablet    Sig: Take 500 mg by mouth 2 (two) times daily.  [name] escitalopram (LEXAPRO)  20 MG tablet    Sig: Take 20 mg by mouth daily.    History  Substance Use Topics  . Smoking status: Never Smoker   . Smokeless tobacco: Never Used  . Alcohol Use: No    Family History  Problem Relation Age of Onset  . Heart disease Mother   . Diabetes Mother   . Heart disease Brother   . Cancer Sister   . Heart disease Brother   . Heart disease Brother      Review of Systems  Constitutional: Negative.   HENT: Negative.   Eyes: Positive for blurred vision. Negative for double vision, photophobia, pain, discharge and redness.  Respiratory:  Negative.   Cardiovascular: Negative.   Gastrointestinal: Negative.   Genitourinary: Negative.   Musculoskeletal: Positive for joint pain.  Skin: Negative.   Neurological: Negative.   Endo/Heme/Allergies: Negative.   Psychiatric/Behavioral: Negative.     Objective:  Physical Exam  Constitutional: She is oriented to person, place, and time. She appears well-developed and well-nourished.  HENT:  Head: Normocephalic and atraumatic.  Eyes: Conjunctivae are normal. Pupils are equal, round, and reactive to light.  Neck: Normal range of motion. Neck supple.  Cardiovascular: Normal rate and regular rhythm.   Respiratory: Effort normal and breath sounds normal.  GI: Soft. Bowel sounds are normal.  Genitourinary:  Not pertinent to current symptomatology therefore not examined.  Musculoskeletal:  Antalgic gait. Right knee has fairly good range of motion. Positive crepitus. 1+ effusion. Exquisite joint line tenderness. Ligaments stable. bilateral calves non-tender neurovascularly intact. Skin warm and dry.   Neurological: She is alert and oriented to person, place, and time.  Skin: Skin is warm and dry.  Psychiatric: She has a normal mood and affect. Her behavior is normal.   X-RAYS: Right knee AP lateral sunrise views show medial and lateral compartmental narrowing. Periarticular spurs. Moderate patellofemoral degenerative changes.   Vital signs in last 24 hrs Last recorded: 07/01 1100   BP: 115/75 Pulse: 95  Temp: 98.3 F (36.8 C)    Height: 5' 3"  (1.6 m) SpO2: 96  Weight: 84.823 kg (187 lb)    Labs:   Estimated body mass index is 33.13 kg/(m^2) as calculated from the following:   Height as of this encounter: 5' 3"  (1.6 m).   Weight as of this encounter: 84.823 kg (187 lb).   Imaging Review Plain radiographs demonstrate severe degenerative joint disease of the right knee(s). The overall alignment isneutral. The bone quality appears to be good for age and reported activity  level.  Assessment/Plan:  End stage arthritis, right knee   The patient history, physical examination, clinical judgment of the provider and imaging studies are consistent with end stage degenerative joint disease of the right knee(s) and total knee arthroplasty is deemed medically necessary. The treatment options including medical management, injection therapy arthroscopy and arthroplasty were discussed at length. The risks and benefits of total knee arthroplasty were presented and reviewed. The risks due to aseptic loosening, infection, stiffness, patella tracking problems, thromboembolic complications and other imponderables were discussed. The patient acknowledged the explanation, agreed to proceed with the plan and consent was signed. Patient is being admitted for inpatient treatment for surgery, pain control, PT, OT, prophylactic antibiotics, VTE prophylaxis, progressive ambulation and ADL's and discharge planning. The patient is planning to be discharged home with home health services  Veleta Yamamoto A. Kaleen Mask Physician Assistant [facility] [phone]  08/18/2012, 11:49 AM

## 2012-09-02 NOTE — Interval H&P Note (Signed)
History and Physical Interval Note:  09/02/2012 7:53 AM  Carol Richmond  has presented today for surgery, with the diagnosis of DJD RIGHT KNEE  The various methods of treatment have been discussed with the patient and family. After consideration of risks, benefits and other options for treatment, the patient has consented to  Procedure(s): TOTAL KNEE ARTHROPLASTY (Right) as a surgical intervention .  The patient's history has been reviewed, patient examined, no change in status, stable for surgery.  I have reviewed the patient's chart and labs.  Questions were answered to the patient's satisfaction.     Sam Overbeck F

## 2012-09-02 NOTE — Preoperative (Signed)
Beta Blockers   Reason not to administer Beta Blockers:Not Applicable 

## 2012-09-03 DIAGNOSIS — M1711 Unilateral primary osteoarthritis, right knee: Secondary | ICD-10-CM | POA: Diagnosis present

## 2012-09-03 LAB — GLUCOSE, CAPILLARY
Glucose-Capillary: 125 mg/dL — ABNORMAL HIGH (ref 70–99)
Glucose-Capillary: 128 mg/dL — ABNORMAL HIGH (ref 70–99)

## 2012-09-03 LAB — PROTIME-INR
INR: 1.12 (ref 0.00–1.49)
Prothrombin Time: 14.2 seconds (ref 11.6–15.2)

## 2012-09-03 LAB — CBC
HCT: 30.8 % — ABNORMAL LOW (ref 36.0–46.0)
MCH: 30.3 pg (ref 26.0–34.0)
MCV: 89.8 fL (ref 78.0–100.0)
Platelets: 205 10*3/uL (ref 150–400)
RDW: 14 % (ref 11.5–15.5)

## 2012-09-03 LAB — BASIC METABOLIC PANEL
BUN: 15 mg/dL (ref 6–23)
Calcium: 8.2 mg/dL — ABNORMAL LOW (ref 8.4–10.5)
Creatinine, Ser: 0.75 mg/dL (ref 0.50–1.10)
GFR calc Af Amer: >90 mL/min (ref >90)

## 2012-09-03 LAB — HEMOGLOBIN A1C: Hgb A1c MFr Bld: 6.2 % — ABNORMAL HIGH (ref <5.7)

## 2012-09-03 MED ORDER — WARFARIN SODIUM 5 MG PO TABS
5.0000 mg | ORAL_TABLET | Freq: Once | ORAL | Status: AC
  Administered 2012-09-03: 5 mg via ORAL
  Filled 2012-09-03 (×2): qty 1

## 2012-09-03 NOTE — Plan of Care (Signed)
Problem: Consults Goal: Diagnosis- Total Joint Replacement Primary Total Knee

## 2012-09-03 NOTE — Progress Notes (Signed)
ANTICOAGULATION CONSULT NOTE - Initial Consult  Pharmacy Consult for Coumadin Indication: VTE prophylaxis  No Known Allergies  Patient Measurements:  Weight: 85.4kg Heparin Dosing Weight:   Vital Signs: Temp: 98.1 F (36.7 C) (07/17 0600) BP: 120/61 mmHg (07/17 0600) Pulse Rate: 93 (07/17 0600)  Labs:  Recent Labs  09/02/12 1830 09/03/12 0605  HGB 11.0* 10.4*  HCT 31.7* 30.8*  PLT 202 205  LABPROT  --  14.2  INR  --  1.12  CREATININE 0.74 0.75    The CrCl is unknown because both a height and weight (above a minimum accepted value) are required for this calculation.   Medical History: Past Medical History  Diagnosis Date  . GERD (gastroesophageal reflux disease)   . Diarrhea   . Hypertension   . Hormone replacement therapy (postmenopausal)   . Migraine   . Seizures   . IBS (irritable bowel syndrome)   . Functional ovarian cysts   . Diverticulosis of colon (without mention of hemorrhage)   . Hemorrhoids, external, without mention of complication   . Right knee DJD   . Shortness of breath   . Asthma   . Diabetes mellitus without complication   . Sleep apnea     no cpap    study 2 yrs ago    Medications:  Prescriptions prior to admission  Medication Sig Dispense Refill  . cholecalciferol (VITAMIN D) 1000 UNITS tablet Take 1,000 Units by mouth daily.      [name] escitalopram (LEXAPRO) 20 MG tablet Take 20 mg by mouth daily with breakfast.       . estradiol (ESTRACE) 0.5 MG tablet Take 0.5 mg by mouth daily.      [name] lamoTRIgine (LAMICTAL) 200 MG tablet Take 200 mg by mouth 2 (two) times daily.       [name] lisinopril-hydrochlorothiazide (PRINZIDE,ZESTORETIC) 20-12.5 MG per tablet Take 1 tablet by mouth daily.        [name] LORazepam (ATIVAN) 2 MG tablet Take 2 mg by mouth 2 (two) times daily.      [name] METFORMIN HCL PO Take 1,000 mg by mouth 2 (two) times daily before a meal.       . mometasone-formoterol (DULERA) 100-5 MCG/ACT AERO Inhale 2 puffs into the lungs 2 (two) times  daily.      [name] omeprazole (PRILOSEC) 20 MG capsule Take 20 mg by mouth daily.      . simvastatin (ZOCOR) 20 MG tablet Take 20 mg by mouth every evening.      . valACYclovir (VALTREX) 500 MG tablet Take 500 mg by mouth 2 (two) times daily.        Assessment: 62yof on Coumadin for VTE prophylaxis s/p TKA. Patient is also receiving Lovenox 37m q24h until INR > 1.8 per MD orders. INR 1.12, remains low, but only received on dose of coumadin 538m Hg 10.4, Plts 205, stable. No bleeding noted per chart.   Goal of Therapy:  INR 2-3   Plan:  1. Coumadin 69m59mo x 1 today 2. Daily INR  MeiMaryanna ShapeharmD, BCPS  Clinical Pharmacist  Pager: [phone]/17/2014,2:35 PM

## 2012-09-03 NOTE — Progress Notes (Signed)
UR COMPLETED  

## 2012-09-03 NOTE — Progress Notes (Signed)
Subjective: 1 Day Post-Op Procedure(s) (LRB): TOTAL KNEE ARTHROPLASTY- right (Right) Patient reports pain as 5 on 0-10 scale.    Objective: Vital signs in last 24 hours: Temp:  [97.3 F (36.3 C)-98.6 F (37 C)] 98.1 F (36.7 C) (07/17 0600) Pulse Rate:  [90-100] 93 (07/17 0600) Resp:  [16-20] 18 (07/17 0600) BP: (105-122)/(55-77) 120/61 mmHg (07/17 0600) SpO2:  [9 %-99 %] 93 % (07/17 1010)  Intake/Output from previous day: 07/16 0701 - 07/17 0700 In: 1950 [I.V.:1400] Out: 700 [Urine:450; Drains:250] Intake/Output this shift: Total I/O In: 480 [P.O.:480] Out: -    Recent Labs  09/02/12 1830 09/03/12 0605  HGB 11.0* 10.4*    Recent Labs  09/02/12 1830 09/03/12 0605  WBC 18.2* 15.0*  RBC 3.57* 3.43*  HCT 31.7* 30.8*  PLT 202 205    Recent Labs  09/02/12 1830 09/03/12 0605  NA  --  134*  K  --  4.2  CL  --  99  CO2  --  25  BUN  --  15  CREATININE 0.74 0.75  GLUCOSE  --  160*  CALCIUM  --  8.2*    Recent Labs  09/03/12 0605  INR 1.12    Sensation intact distally Intact pulses distally Dorsiflexion/Plantar flexion intact Compartment soft Hemovac drain pulled without difficulty  Assessment/Plan: 1 Day Post-Op Procedure(s) (LRB): TOTAL KNEE ARTHROPLASTY- right (Right) Advance diet Up with therapy D/C IV fluids Plan for discharge tomorrow if PT progresses  ROBERTS,JANE B 09/03/2012, 2:43 PM

## 2012-09-03 NOTE — Care Management Note (Signed)
    Page 1 of 2   09/03/2012     4:38:30 PM   CARE MANAGEMENT NOTE 09/03/2012  Patient:  Carol Richmond, Carol Richmond   Account Number:  [id number]  Date Initiated:  09/03/2012  Documentation initiated by:  [facility]  Subjective/Objective Assessment:   admitted postop rt total knee     Action/Plan:   plan return home with HHPT and [place]   Anticipated DC Date:  09/04/2012   Anticipated DC Plan:  [place]  CM consult      Choice offered to / List presented to:     DME arranged  [facility]  3-N-1  [place]      DME agency  [employer]     [place] arranged  [facility]      [place].   Status of service:  Completed, signed off Medicare Important Message given?   (If response is "NO", the following Medicare IM given date fields will be blank) Date Medicare IM given:   Date Additional Medicare IM given:    Discharge Disposition:  [place]  Per UR Regulation:    If discussed at Long Length of Stay Meetings, dates discussed:    Comments:  09/03/12 Patient set up with [place] [facility] [place] with [facility] by MD office.Spoke with patient, no change in d/c plan. Fuller Plan RN, BSN, CCM

## 2012-09-03 NOTE — Evaluation (Signed)
Physical Therapy Evaluation Patient Details Name: Carol Richmond MRN: [id number] DOB: 05/01/1950 Today's Date: 09/03/2012 Time: 2353-6144 PT Time Calculation (min): 28 min  PT Assessment / Plan / Recommendation History of Present Illness  s/p elective R TKA  Clinical Impression  Pt is s/p R TKA resulting in the deficits listed below (see PT Problem List). Pt limited in this session due to dizziness with activity. Pt will benefit from skilled PT to increase their independence and safety with mobility to allow discharge to the venue listed below. Discussed with pt goals to be met prior to safe D/C home with family and hhpt; pt verbalized understanding.       PT Assessment  Patient needs continued PT services    Follow Up Recommendations  Home health PT;Supervision/Assistance - 24 hour    Does the patient have the potential to tolerate intense rehabilitation      Barriers to Discharge        Equipment Recommendations  None recommended by PT    Recommendations for Other Services     Frequency 7X/week    Precautions / Restrictions Precautions Precautions: Fall;Knee Precaution Comments: pt given TKA exercise protocol; no pillow under knee Required Braces or Orthoses: Knee Immobilizer - Right Knee Immobilizer - Right: Other (comment) (not specified by MD) Restrictions Weight Bearing Restrictions: Yes RLE Weight Bearing: Weight bearing as tolerated   Pertinent Vitals/Pain BP after activity 99/70; denies any pain; pt premedicated prior to treatment      Mobility  Bed Mobility Bed Mobility: Supine to Sit Supine to Sit: 5: Supervision;HOB elevated;With rails Details for Bed Mobility Assistance: pt required min cues for hand placement and sequencing; increased time and handrails required due to pain  Transfers Transfers: Sit to Stand;Stand to Sit Sit to Stand: From bed;4: Min assist;With upper extremity assist Stand to Sit: 4: Min assist;To chair/3-in-1;To elevated  surface;With upper extremity assist Details for Transfer Assistance: pt required max encouragement; pt seemed fearful and anxious with transfer; required (A) to achieve upright standing position and multimodal cues for hand placement and sequencing with RW Ambulation/Gait Ambulation/Gait Assistance: 4: Min assist Ambulation Distance (Feet): 6 Feet Assistive device: Rolling walker Ambulation/Gait Assistance Details: verbal cues for gt sequencing and (A) required to advance/manage RW; pt has tendency to hold breathe with amb due to increased anxiety with amb; given max encouragement to increase amb and trust R knee; relies heavily on UEs to advance L LE Gait Pattern: Step-to pattern;Narrow base of support;Antalgic;Decreased weight shift to right;Decreased stance time - right;Decreased step length - left Gait velocity: decreased Stairs: No Wheelchair Mobility Wheelchair Mobility: No    Exercises Total Joint Exercises Ankle Circles/Pumps: AROM;Both;10 reps;Supine Quad Sets: AROM;Right;10 reps;Seated Heel Slides: AAROM;Right;10 reps;Supine Long Arc Quad: Strengthening;Right;10 reps;Seated Goniometric ROM: knee AROM 4 to 50 degrees; limited by pain   PT Diagnosis: Difficulty walking;Acute pain  PT Problem List: Decreased strength;Decreased range of motion;Decreased balance;Decreased mobility;Decreased knowledge of use of DME;Pain PT Treatment Interventions: DME instruction;Gait training;Stair training;Functional mobility training;Therapeutic activities;Therapeutic exercise;Balance training;Neuromuscular re-education;Patient/family education     PT Goals(Current goals can be found in the care plan section) Acute Rehab PT Goals Patient Stated Goal: to go home with husband and daughters PT Goal Formulation: With patient Time For Goal Achievement: 09/10/12 Potential to Achieve Goals: Good  Visit Information  Last PT Received On: 09/03/12 Assistance Needed: +1 History of Present Illness: s/p  elective R TKA       Prior Functioning  Home Living Family/patient expects to be discharged  to:: Private residence Living Arrangements: Spouse/significant other Available Help at Discharge: Family;Available 24 hours/day Type of Home: House Home Access: Stairs to enter [employer] of Steps: 2 in back (5 in front) Entrance Stairs-Rails: Can reach both Home Layout: One level Home Equipment: Walker - 2 wheels;Bedside commode;Shower seat Prior Function Level of Independence: Independent Communication Communication: Development worker, international aid Arousal/Alertness: Awake/alert Behavior During Therapy: WFL for tasks assessed/performed Overall Cognitive Status: Within Functional Limits for tasks assessed    Extremity/Trunk Assessment Upper Extremity Assessment Upper Extremity Assessment: Defer to OT evaluation Lower Extremity Assessment Lower Extremity Assessment: RLE deficits/detail RLE Deficits / Details: knee grossly 3-/5; unable to perform LAQ at this time; ankle DF/PF [facility] RLE Sensation:  (WFL to light touch) Cervical / Trunk Assessment Cervical / Trunk Assessment: Normal   Balance Balance Balance Assessed: Yes Static Sitting Balance Static Sitting - Balance Support: Bilateral upper extremity supported;Feet unsupported Static Sitting - Level of Assistance: 5: Stand by assistance Static Sitting - Comment/# of Minutes: pt tolerated sitting EOB ~4 min; c/o dizziness  End of Session PT - End of Session Equipment Utilized During Treatment: Gait belt;Right knee immobilizer Activity Tolerance: Other (comment) (limited by dizziniess with activity) Patient left: in chair;with call bell/phone within reach;with family/visitor present Nurse Communication: Mobility status CPM Right Knee CPM Right Knee: [address]  GP     Elie Confer [place], [place] 09/03/2012, 9:38 AM

## 2012-09-03 NOTE — Anesthesia Postprocedure Evaluation (Signed)
  Anesthesia Post-op Note  Patient: Carol Richmond  Procedure(s) Performed: Procedure(s): TOTAL KNEE ARTHROPLASTY- right (Right)  Patient Location: Nursing Unit  Anesthesia Type:General  Level of Consciousness: awake, alert  and oriented  Airway and Oxygen Therapy: Patient Spontanous Breathing  Post-op Pain: mild  Post-op Assessment: Post-op Vital signs reviewed, Patient's Cardiovascular Status Stable, Respiratory Function Stable, Patent Airway, No signs of Nausea or vomiting, Adequate PO intake, Pain level controlled, No residual numbness and No residual motor weakness  Post-op Vital Signs: Reviewed and stable  Complications: No apparent anesthesia complications

## 2012-09-03 NOTE — Progress Notes (Signed)
Physical Therapy Treatment Patient Details Name: Carol Richmond MRN: [id number] DOB: 08/24/1950 Today's Date: 09/03/2012 Time: 3785-8850 PT Time Calculation (min): 20 min  PT Assessment / Plan / Recommendation  PT Comments   Pt slow to progress with therapy. Requires max encouragement to increase amb distance. Discussed with pt and family the goals that need to be met from mobility and PT standpoint to safely D/C home; the family and pt verbalized understanding. Tomorrow the patient will need to be seen BID; in the morning to increase mobility and focus on theraex; the afternoon session to focus on pt and family education for stair mobility. Family plans to be at hospital after 1pm for education. Pt will need to progress or will need STSNF for rehab for safest D/C plan.  Follow Up Recommendations  Home health PT;Supervision/Assistance - 24 hour     Does the patient have the potential to tolerate intense rehabilitation     Barriers to Discharge        Equipment Recommendations  None recommended by PT    Recommendations for Other Services    Frequency 7X/week   Progress towards PT Goals Progress towards PT goals: Progressing toward goals  Plan Current plan remains appropriate    Precautions / Restrictions Precautions Precautions: Fall;Knee Required Braces or Orthoses: Knee Immobilizer - Right Restrictions Weight Bearing Restrictions: Yes RLE Weight Bearing: Weight bearing as tolerated   Pertinent Vitals/Pain 6/10; pt premedicated. In CPM tolerating to 60 degrees.     Mobility  Bed Mobility Bed Mobility: Supine to Sit;Sit to Supine Supine to Sit: 5: Supervision;HOB elevated;With rails Sit to Supine: 5: Supervision;HOB flat Details for Bed Mobility Assistance: pt required handrails and increased time to complete bed mobility due to pain; pt encouraged to discontinue use of hand rails because she will not have them at home; will cont to attempt bed mobility without  railing Transfers Transfers: Sit to Stand;Stand to Sit Sit to Stand: 4: Min assist;From bed;With upper extremity assist Stand to Sit: 4: Min assist;To bed;With upper extremity assist Details for Transfer Assistance: pt required assistance to achieve standing position; pt initally demo posterior lean and required bracing to maintain balance; continuous cues to find midline and achieve balance; pt was able to achieve balanced standing position with multimodal cues and increased time; min verbal cues for hand placement and safety with RW Ambulation/Gait Ambulation/Gait Assistance: 4: Min assist Ambulation Distance (Feet): 20 Feet Assistive device: Rolling walker Ambulation/Gait Assistance Details: max encouragement to increase amb distance; max cues for gt sequencing and to increase step length; pt cont to amb with short shuffled gt; pt requires min (A) to manage RW at times and required multiple standing rest breaks to control breathing Gait Pattern: Step-to pattern;Narrow base of support;Antalgic;Decreased weight shift to right;Decreased stance time - right;Decreased step length - left Gait velocity: decreased Stairs: No Wheelchair Mobility Wheelchair Mobility: No    Exercises Total Joint Exercises Heel Slides: AAROM;Right;10 reps;Seated Long Arc Quad: Strengthening;Right;10 reps;Seated Knee Flexion: AAROM;Right;10 reps;Seated   PT Diagnosis:    PT Problem List:   PT Treatment Interventions:     PT Goals (current goals can now be found in the care plan section) Acute Rehab PT Goals Patient Stated Goal: to go home with husband and daughters PT Goal Formulation: With patient Time For Goal Achievement: 09/10/12 Potential to Achieve Goals: Good  Visit Information  Last PT Received On: 09/03/12 Assistance Needed: +1 History of Present Illness: s/p elective R TKA    Subjective Data  Subjective: pt lying supine and crying stated "this machine (CPM) is not right and its hurting me"   Patient Stated Goal: to go home with husband and daughters   Cognition  Cognition Arousal/Alertness: Awake/alert Behavior During Therapy: Anxious Overall Cognitive Status: Within Functional Limits for tasks assessed    Balance  Balance Balance Assessed: No  End of Session PT - End of Session Equipment Utilized During Treatment: Gait belt;Right knee immobilizer Activity Tolerance: Patient limited by pain;Patient limited by fatigue Patient left: in bed;in CPM;with call bell/phone within reach;with family/visitor present Nurse Communication: Mobility status   [place], [place], [place] 09/03/2012, 3:06 PM

## 2012-09-04 ENCOUNTER — Encounter (HOSPITAL_COMMUNITY): Payer: Self-pay | Admitting: Orthopedic Surgery

## 2012-09-04 LAB — BASIC METABOLIC PANEL
BUN: 11 mg/dL (ref 6–23)
Calcium: 8.2 mg/dL — ABNORMAL LOW (ref 8.4–10.5)
Creatinine, Ser: 0.82 mg/dL (ref 0.50–1.10)
GFR calc Af Amer: 88 mL/min — ABNORMAL LOW (ref >90)
GFR calc non Af Amer: 76 mL/min — ABNORMAL LOW (ref >90)
Glucose, Bld: 187 mg/dL — ABNORMAL HIGH (ref 70–99)

## 2012-09-04 LAB — CBC
HCT: 28.9 % — ABNORMAL LOW (ref 36.0–46.0)
Hemoglobin: 10 g/dL — ABNORMAL LOW (ref 12.0–15.0)
MCH: 30.6 pg (ref 26.0–34.0)
MCHC: 34.6 g/dL (ref 30.0–36.0)
MCV: 88.4 fL (ref 78.0–100.0)
RDW: 13.9 % (ref 11.5–15.5)

## 2012-09-04 LAB — GLUCOSE, CAPILLARY
Glucose-Capillary: 130 mg/dL — ABNORMAL HIGH (ref 70–99)
Glucose-Capillary: 139 mg/dL — ABNORMAL HIGH (ref 70–99)

## 2012-09-04 MED ORDER — WARFARIN SODIUM 5 MG PO TABS: 5.0000 mg | ORAL_TABLET | Freq: Every day | ORAL | Status: DC

## 2012-09-04 MED ORDER — OXYCODONE-ACETAMINOPHEN 5-325 MG PO TABS: 1.0000 | ORAL_TABLET | ORAL | Status: DC | PRN

## 2012-09-04 MED ORDER — BISACODYL 10 MG RE SUPP
10.0000 mg | Freq: Every day | RECTAL | Status: DC | PRN
  Administered 2012-09-04: 10 mg via RECTAL
  Filled 2012-09-04: qty 1

## 2012-09-04 MED ORDER — ENOXAPARIN SODIUM 30 MG/0.3ML ~~LOC~~ SOLN: 30.0000 mg | SUBCUTANEOUS | Status: DC

## 2012-09-04 MED ORDER — WARFARIN SODIUM 5 MG PO TABS
5.0000 mg | ORAL_TABLET | Freq: Once | ORAL | Status: AC
  Administered 2012-09-04: 5 mg via ORAL
  Filled 2012-09-04: qty 1

## 2012-09-04 MED ORDER — DSS 100 MG PO CAPS: 100.0000 mg | ORAL_CAPSULE | Freq: Two times a day (BID) | ORAL | Status: DC

## 2012-09-04 NOTE — Progress Notes (Signed)
Orthopedic Tech Progress Note Patient Details:  ORPHIA MCTIGUE [birth year] [id number] Put on cpm at 8:35 0-65 RLE Patient ID: [name], female   DOB: 09-22-1950, 62 y.o.   MRN: [id number]   Braulio Bosch 09/04/2012, 8:38 PM

## 2012-09-04 NOTE — Progress Notes (Signed)
ANTICOAGULATION CONSULT NOTE - Follow Up Consult  Pharmacy Consult for Coumadin Indication: VTE prophylaxis s/p R  TKA  No Known Allergies  Patient Measurements:     Vital Signs: Temp: 98.8 F (37.1 C) (07/18 0600) BP: 129/56 mmHg (07/18 0957) Pulse Rate: 104 (07/18 0957)  Labs:  Recent Labs  09/02/12 1830 09/03/12 0605 09/04/12 0545  HGB 11.0* 10.4* 10.0*  HCT 31.7* 30.8* 28.9*  PLT 202 205 196  LABPROT  --  14.2 15.7*  INR  --  1.12 1.28  CREATININE 0.74 0.75 0.82    Medications:  Scheduled:  . coumadin book   Does not apply Once  . docusate sodium  100 mg Oral BID  . enoxaparin (LOVENOX) injection  30 mg Subcutaneous Q24H  . escitalopram  20 mg Oral Q breakfast  . lisinopril  20 mg Oral Daily   And  . hydrochlorothiazide  12.5 mg Oral Daily  . insulin aspart  0-15 Units Subcutaneous TID WC  . insulin aspart  0-5 Units Subcutaneous QHS  . insulin aspart  3 Units Subcutaneous TID WC  . lamoTRIgine  200 mg Oral BID  . LORazepam  2 mg Oral BID  . metFORMIN  1,000 mg Oral BID WC  . mometasone-formoterol  2 puff Inhalation BID  . pantoprazole  40 mg Oral Daily  . simvastatin  20 mg Oral QPM  . valACYclovir  500 mg Oral BID  . warfarin   Does not apply Once  . Warfarin - Pharmacist Dosing Inpatient   Does not apply q1800    Assessment: 62 yo F continues on Coumadin for post-op VTE px s/p TKA.  INR rising on curent Coumadin dose.  No bleeding noted.  Goal of Therapy:  INR 2-3 Monitor platelets by anticoagulation protocol: Yes   Plan:  Repeat Coumadin 5 mg PO x 1 tonight. Continue daily INR.  [employer], Pharm.D., BCPS Clinical Pharmacist Pager [phone] 09/04/2012 10:47 AM

## 2012-09-04 NOTE — Op Note (Signed)
NAMEMarland Kitchen  Carol Richmond, Carol Richmond NO.:  [id number]  MEDICAL RECORD NO.:  [id number]  LOCATION:  5N31C                        FACILITY:  [facility]  PHYSICIAN:  Ninetta Lights, M.D. DATE OF BIRTH:  October 16, 1950  DATE OF PROCEDURE:  09/02/2012 DATE OF DISCHARGE:                              OPERATIVE REPORT   PREOPERATIVE DIAGNOSIS:  Right knee end-stage degenerative arthritis, most severe patellofemoral joint.  Also focal grade 4 medial compartment.  POSTOPERATIVE DIAGNOSIS:  Right knee end-stage degenerative arthritis, most severe patellofemoral joint. Also focal grade 4 medial compartment.  PROCEDURE:  Right knee OrthoSensor computer-assisted modified minimally invasive total knee replacement utilizing Stryker triathlon prosthesis. Soft tissue balancing.  Cemented pegged posterior stabilized #3 femoral component.  Cemented #4 tibial component, 9 mm polyethylene insert. Resurfacing 29 mm patellar component.  SURGEON:  Ninetta Lights, M.D.  ASSISTANT:  Nehemiah Massed, PA, present throughout the entire case and necessary for timely completion of procedure.  ANESTHESIA:  General.  BLOOD LOSS:  Minimal.  SPECIMENS:  None.  CULTURES:  None.  COMPLICATIONS:  None.  DRESSINGS:  Soft compressive knee immobilizer.  DRAINS:  Hemovac x1.  TOURNIQUET TIME:  1 hour.  PROCEDURE:  The patient was brought to the operating room, placed on the operating table in a supine position.  After adequate anesthesia had been obtained, tourniquet applied.  Prepped and draped in usual sterile fashion.  Exsanguinated with elevation of Esmarch.  Tourniquet inflated to 350 mmHg.  The patient was noted to have a mild flexion contracture, further good flexion.  Anterior incision above the patella down to the tibial tubercle.  Skin and subcutaneous tissue divided.  Hemostasis with cautery.  Medial arthrotomy, vastus splitting.  Medial capsule released. Remnants of menisci, cruciate ligaments,  and loose bodies removed. Grade 4 changes patellofemoral joint and medial femoral condyle.  Distal femur exposed.  Intramedullary guide placed.  Distal cut 6 degrees of valgus resecting 10 mm.  Using the epicondylar axis, the femur was sized, cut, and fitted for posterior stabilized with 3 component. Proximal tibial resection extramedullary guide, 3-degree posterior slope cut.  Additional cut at the standard level.  When that was complete, I looked at the knee in flexion/extension for balancing.  I also put trials in place with the OrthoSensor device.  She had excessive pressures both in extension and flexion.  As a result, I took 2 more mm off the tibia.  The knee was then irrigated out.  Patella exposed. Posterior 9 mm removed.  Drilled, sized, fitted for a 29 mm component. Trials were then put back in place.  With that extratibial resection, I was then very pleased with balancing of the knee, full motion, good patellar tracking.  OrthoSensor measurements showed a symmetric loading medial to lateral and from full extension to full flexion, all within acceptable limits.  Rotation was set utilizing the OrthoSensor assessed and device.  The tibia was then reamed after trials were removed. Copiously irrigated.  Cement prepared and placed on all components.  All components firmly seated.  Polyethylene attached to tibia.  Patella held with a clamp.  At completion, again I was very pleased with flexion, extension, alignment, stability, biomechanical axis, balancing, and patellofemoral tracking.  Wound irrigated.  Soft tissues injected with Exparel.  Hemovac was placed.  Arthrotomy closed with #1 Vicryl.  Skin and subcutaneous tissue with Vicryl and staples.  Sterile compressive dressing applied.  Tourniquet deflated and removed.  Knee immobilizer applied.  Anesthesia reversed.  Brought to the recovery room.  Tolerated the surgery well.  No complications.     Ninetta Lights,  M.D.     DFM/MEDQ  D:  09/03/2012  T:  09/04/2012  Job:  [id number]

## 2012-09-04 NOTE — Progress Notes (Signed)
Physical Therapy Treatment Patient Details Name: Carol Richmond MRN: [id number] DOB: 1950-10-29 Today's Date: 09/04/2012 Time: 4098-1191 PT Time Calculation (min): 32 min  PT Assessment / Plan / Recommendation  PT Comments   Treatment session focused on pt and family education for guarding techniques during amb and stair amb technique. Pt cont to demo decreased safety awareness and balance deficits with mobility. Discussed with pt and family concern regarding safety with mobility upon D/C. Pt and family continue to adamantly refuse STSNF, but are agreeable to another night in the hospital for therapy session tomorrow. Treatment session tomorrow will need to address more family education regarding safe mobility and stair amb.   Follow Up Recommendations  Home health PT;Supervision/Assistance - 24 hour     Does the patient have the potential to tolerate intense rehabilitation     Barriers to Discharge        Equipment Recommendations  None recommended by PT    Recommendations for Other Services OT consult  Frequency 7X/week   Progress towards PT Goals Progress towards PT goals: Progressing toward goals  Plan Current plan remains appropriate    Precautions / Restrictions Precautions Precautions: Fall;Knee Required Braces or Orthoses: Knee Immobilizer - Right Restrictions Weight Bearing Restrictions: Yes RLE Weight Bearing: Weight bearing as tolerated   Pertinent Vitals/Pain "my knee doesn't really hurt"    Mobility  Bed Mobility Bed Mobility: Supine to Sit;Sit to Supine Supine to Sit: 5: Supervision;HOB flat Sit to Supine: HOB flat;5: Supervision Details for Bed Mobility Assistance: min cues for hand placement and sequencing; pt encouraged to perform bed mobility like she would have to at home (without HOB elevated and rails); max encouragement required Transfers Transfers: Sit to Stand;Stand to Sit Sit to Stand: 4: Min guard;From bed;From elevated surface Stand to Sit:  4: Min guard;To bed Details for Transfer Assistance: no LOB or posterior lean noted with transfers this session; pt required mod to max cues for hand placement and safety; pt continues to demo decreased safety awareness with transfers;demo to family proper guarding techniques to (A) pt when D/C home  Ambulation/Gait Ambulation/Gait Assistance: 4: Min assist Ambulation Distance (Feet): 100 Feet Assistive device: Rolling walker Ambulation/Gait Assistance Details: pt demo decreased safety awareness with RW; has tendency to walk outside of RW and can become off balance to R; max cues given for safety with RW and to increase BOS to increase stability; decreased carryover with instructions and cues; safety technique and guarding technique instructions given to family; pt cont to amb with decrease heel strike on R LE, short shuffled gt and narrow BOS; (A) needed to manage RW at times, primarily around obstacles  Gait Pattern: Step-to pattern;Decreased stride length;Shuffle;Narrow base of support Gait velocity: decreased; unsafe to increase gt at this time  Stairs: Yes Stairs Assistance: 3: Mod assist Stairs Assistance Details (indicate cue type and reason): pt and family education provided; given demo on proper technique for stair amb; family instructed on proper guarding technique; pt required max cues for proper sequencing and safety; pt has tendency to freeze on steps and requires max encouragement; with fatigue and anxiety pt tends to have difficulty processing instructions and becomes unsafe Stair Management Technique: No rails;Backwards;With walker;Step to pattern Number of Stairs: 3 Wheelchair Mobility Wheelchair Mobility: No    Exercises Total Joint Exercises Ankle Circles/Pumps: AROM;Both;10 reps;Seated Quad Sets: AROM;Right;10 reps;Seated Heel Slides: PROM;Right;10 reps;Seated Long Arc Quad: Strengthening;Right;10 reps;Seated Goniometric ROM: PROM knee 4 to 90 degrees   PT Diagnosis:  PT Problem List:   PT Treatment Interventions:     PT Goals (current goals can now be found in the care plan section) Acute Rehab PT Goals Patient Stated Goal: to not go to a nursing home PT Goal Formulation: With patient Time For Goal Achievement: 09/10/12 Potential to Achieve Goals: Good  Visit Information  Last PT Received On: 09/04/12 Assistance Needed: +1 History of Present Illness: s/p elective R TKA    Subjective Data  Subjective: pt lying supine with family present agreeable to therapy Patient Stated Goal: to not go to a nursing home   Cognition  Cognition Arousal/Alertness: Awake/alert Behavior During Therapy: Anxious;Flat affect Overall Cognitive Status: Within Functional Limits for tasks assessed Memory: Decreased recall of precautions;Decreased short-term memory    Balance  Balance Balance Assessed: Yes Static Standing Balance Static Standing - Balance Support: Bilateral upper extremity supported;During functional activity Static Standing - Level of Assistance: 5: Stand by assistance Static Standing - Comment/# of Minutes: (A) to maintain balance; has tedency to thurst posteriorly and can lose balance  End of Session PT - End of Session Equipment Utilized During Treatment: Gait belt;Right knee immobilizer Activity Tolerance: Patient tolerated treatment well Patient left: in bed;in CPM;with call bell/phone within reach;with family/visitor present Nurse Communication: Mobility status;Other (comment) (pt not safe at this time to D/C home)   [place], [place], [place] 09/04/2012, 1:47 PM

## 2012-09-04 NOTE — Progress Notes (Signed)
Physical Therapy Treatment Patient Details Name: Carol Richmond MRN: [id number] DOB: 01-04-1951 Today's Date: 09/04/2012 Time: 2585-2778 PT Time Calculation (min): 32 min  PT Assessment / Plan / Recommendation  PT Comments   Pt is slowly progressing with therapy and mobility. Continues to demo decreased safety awareness and balance deficits. Pt would benefit from STSNF for rehab; however, pt is adamantly refusing rehab and insists on returning home with husband and HHPT.  Next session will focus on increasing amb and family education for stair training.   Follow Up Recommendations  Home health PT;Supervision/Assistance - 24 hour     Does the patient have the potential to tolerate intense rehabilitation     Barriers to Discharge        Equipment Recommendations  None recommended by PT    Recommendations for Other Services OT consult  Frequency 7X/week   Progress towards PT Goals Progress towards PT goals: Progressing toward goals  Plan Current plan remains appropriate    Precautions / Restrictions Precautions Precautions: Fall;Knee Required Braces or Orthoses: Knee Immobilizer - Right Restrictions Weight Bearing Restrictions: Yes RLE Weight Bearing: Weight bearing as tolerated   Pertinent Vitals/Pain No new complaints. States "it doesn't really hurt right now". Pt premedicated.     Mobility  Bed Mobility Bed Mobility: Not assessed Transfers Transfers: Sit to Stand;Stand to Sit Sit to Stand: 4: Min assist;From chair/3-in-1;With armrests Stand to Sit: 4: Min guard;To chair/3-in-1;With armrests;With upper extremity assist Details for Transfer Assistance: pt has difficulty maintaining balance with initial sit to stand; required (A) to maintain balance; had posterior loss of balance with sit to stand from chair and from toilet; pt seems unaware of this LOB; demo decreased safety awareness; requires mod to max cues for hand placement, sequencing and safety with transfers   Ambulation/Gait Ambulation/Gait Assistance: 4: Min assist Ambulation Distance (Feet): 60 Feet Assistive device: Rolling walker Ambulation/Gait Assistance Details: mod cues for gt sequencing and requires (A) to advance and manage RW; pt has difficulty maintaining step through pattern and continues with step to pattern; pt tends to hold breath with activity and is anxious with amb; max encouragement to increase amb distance Gait Pattern: Step-to pattern;Step-through pattern;Narrow base of support;Decreased stride length Gait velocity: decreased Stairs: No Wheelchair Mobility Wheelchair Mobility: No    Exercises Total Joint Exercises Ankle Circles/Pumps: AROM;Both;10 reps;Seated Quad Sets: AROM;Right;10 reps;Seated Heel Slides: PROM;Right;10 reps;Seated Long Arc Quad: Strengthening;Right;10 reps;Seated Goniometric ROM: PROM knee 4 to 90 degrees   PT Diagnosis:    PT Problem List:   PT Treatment Interventions:     PT Goals (current goals can now be found in the care plan section) Acute Rehab PT Goals Patient Stated Goal: to go home and not rehab PT Goal Formulation: With patient Time For Goal Achievement: 09/10/12 Potential to Achieve Goals: Good  Visit Information  Last PT Received On: 09/04/12 Assistance Needed: +1 History of Present Illness: s/p elective R TKA    Subjective Data  Subjective: pt sitting in chair and agreeable to therapy. states " can i try to use the bathroom first. i think i could do number 2." Patient Stated Goal: to go home and not rehab   Cognition  Cognition Arousal/Alertness: Awake/alert Behavior During Therapy: Anxious Overall Cognitive Status: Within Functional Limits for tasks assessed    Balance  Balance Balance Assessed: Yes Static Standing Balance Static Standing - Balance Support: Bilateral upper extremity supported;During functional activity Static Standing - Level of Assistance: 4: Min assist Static Standing - Comment/# of  Minutes: (A)  to maintain balance; has tedency to thurst posteriorly and can lose balance  End of Session PT - End of Session Equipment Utilized During Treatment: Gait belt;Right knee immobilizer Activity Tolerance: Patient tolerated treatment well Patient left: with call bell/phone within reach;with family/visitor present;in chair Nurse Communication: Mobility status   GP     Gustavus Bryant, [place] 09/04/2012, 10:43 AM

## 2012-09-04 NOTE — Discharge Summary (Signed)
Physician Discharge Summary  [name] MRN: [id number] DOB/AGE: 08/06/1950 62 y.o.  Admit date: 09/02/2012 Discharge date: 09/04/2012  Admission Diagnoses:  Discharge Diagnoses:  Principal Problem:   Osteoarthritis of right knee   Discharged Condition: good  Hospital Course: patient underwent right total knee on date of admit; had periop antibiotics, post op DVT prophylaxis and post op PT beginning in PACU with CPM. Patient progressed in PT to safe ambulation and transfer; she will continue PT post discharge.  Consults: None  Significant Diagnostic Studies: radiology: post op films show good position  Treatments: antibiotics: Ancef, analgesia: Dilaudid and percocet, anticoagulation: LMW heparin and warfarin and surgery: right total knee arthroplasty  Discharge Exam: Blood pressure 112/60, pulse 101, temperature 99.5 F (37.5 C), temperature source Oral, resp. rate 18, SpO2 93.00%. Incision/Wound:clean and dry  Disposition:   Discharge Orders   Future Orders Complete By Expires     CPM  As directed     Comments:      Continuous passive motion machine (CPM):      Use the CPM from 0 to 60 for 6-8 hours per day.      You may increase by 5-10 per day.  You may break it up into 2 or 3 sessions per day.      Use CPM for 3-4 weeks or until you are told to stop.    Call MD / Call 911  As directed     Comments:      If you experience chest pain or shortness of breath, CALL 911 and be transported to the hospital emergency room.  If you develope a fever above 101 F, pus (white drainage) or increased drainage or redness at the wound, or calf pain, call your surgeon's office.    Change dressing  As directed     Comments:      Change the dressing daily with sterile 4 x 4 inch gauze dressing and apply TED hose.  You may clean the incision with alcohol prior to redressing.    Constipation Prevention  As directed     Comments:      Drink plenty of fluids.  Prune juice  and/or coffee may be helpful.  You may use a stool softener, such as Colace (over the counter) 100 mg twice a day.  Use MiraLax (over the counter) for constipation as needed but this may take several days to work.  Mag Citrate --OR-- Milk of Magnesia may also be used but follow directions on the label.    Diet - low sodium heart healthy  As directed     Discharge instructions  As directed     Comments:      Total Knee Replacement Care After Refer to this sheet in the next few weeks. These discharge instructions provide you with general information on caring for yourself after you leave the hospital. Your caregiver may also give you specific instructions. Your treatment has been planned according to the most current medical practices available, but unavoidable complications sometimes occur. If you have any problems or questions after discharge, please call your caregiver. Regaining a near full range of motion of your knee within the first 3 to 6 weeks after surgery is critical. [place] may resume a normal diet and activities as directed.  Perform exercises as directed.  Place yellow foam block, yellow side up under heel at all times except when in CPM or when walking.  DO NOT modify, tear, cut,  or change in any way. You will receive physical therapy daily  Take showers instead of baths until informed otherwise.  Change bandages (dressings)daily Do not take over-the-counter or prescription medicines for pain, discomfort, or fever. Eat a well-balanced diet.  Avoid lifting or driving until you are instructed otherwise.  Make an appointment to see your caregiver for stitches (suture) or staple removal as directed.  If you have been sent home with a continuous passive motion machine (CPM machine), 0-90 degrees 6 hrs a day   2 hrs a shift SEEK MEDICAL CARE IF: You have swelling of your calf or leg.  You develop shortness of breath or chest pain.  You have redness, swelling, or  increasing pain in the wound.  There is pus or any unusual drainage coming from the surgical site.  You notice a bad smell coming from the surgical site or dressing.  The surgical site breaks open after sutures or staples have been removed.  There is persistent bleeding from the suture or staple line.  You are getting worse or are not improving.  You have any other questions or concerns.  SEEK IMMEDIATE MEDICAL CARE IF:  You have a fever.  You develop a rash.  You have difficulty breathing.  You develop any reaction or side effects to medicines given.  Your knee motion is decreasing rather than improving.  MAKE SURE YOU:  Understand these instructions.  Will watch your condition.  Will get help right away if you are not doing well or get worse. Change the dressing daily with sterile 4 x 4 inch gauze dressing and apply TED hose.  You may clean the incision with alcohol prior to redressing.Continuous passive motion machine (CPM):      Use the CPM from 0 to 90 for 6 hours per day.       You may break it up into 2 or 3 sessions per day.      Use CPM for 2 weeks or until you are told to stop.Continuous passive motion machine (CPM):      Use the CPM from 0 to 90 for 6 hours per day.       You may break it up into 2 or 3 sessions per day.      Use CPM for 2 weeks or until you are told to stop.Use stockings (TED hose) for 2 weeks on both leg(s).  You may remove them at night for sleeping.    Do not put a pillow under the knee. Place it under the heel.  As directed     Comments:      Place yellow block under heel at all times except when up walking or in CPM.  You must sleep in it at night    Increase activity slowly as tolerated  As directed     Patient may shower  As directed     Comments:      You may shower over the brown dressing.  Once the dressing is removed you may shower without a dressing once there is no drainage.  Do not wash over the wound.  If drainage remains, cover wound with  plastic wrap and then shower    TED hose  As directed     Comments:      Use stockings (TED hose) for 2 weeks on both leg(s).  You may remove them at night for sleeping.        Medication List    STOP taking these medications  cholecalciferol 1000 UNITS tablet  Commonly known as:  VITAMIN D      TAKE these medications       DSS 100 MG Caps  Take 100 mg by mouth 2 (two) times daily.     enoxaparin 30 MG/0.3ML injection  Commonly known as:  LOVENOX  Inject 0.3 mLs (30 mg total) into the skin daily.     escitalopram 20 MG tablet  Commonly known as:  LEXAPRO  Take 20 mg by mouth daily with breakfast.     estradiol 0.5 MG tablet  Commonly known as:  ESTRACE  Take 0.5 mg by mouth daily.     oxyCODONE-acetaminophen 5-325 MG per tablet  Commonly known as:  ROXICET  Take 1 tablet by mouth every 4 (four) hours as needed for pain.     warfarin 5 MG tablet  Commonly known as:  COUMADIN  Take 1 tablet (5 mg total) by mouth daily. Or as directed by pharmacy      ASK your doctor about these medications       lamoTRIgine 200 MG tablet  Commonly known as:  LAMICTAL  Take 200 mg by mouth 2 (two) times daily.     lisinopril-hydrochlorothiazide 20-12.5 MG per tablet  Commonly known as:  PRINZIDE,ZESTORETIC  Take 1 tablet by mouth daily.     LORazepam 2 MG tablet  Commonly known as:  ATIVAN  Take 2 mg by mouth 2 (two) times daily.     METFORMIN HCL PO  Take 1,000 mg by mouth 2 (two) times daily before a meal.     mometasone-formoterol 100-5 MCG/ACT Aero  Commonly known as:  DULERA  Inhale 2 puffs into the lungs 2 (two) times daily.     omeprazole 20 MG capsule  Commonly known as:  PRILOSEC  Take 20 mg by mouth daily.     simvastatin 20 MG tablet  Commonly known as:  ZOCOR  Take 20 mg by mouth every evening.     valACYclovir 500 MG tablet  Commonly known as:  VALTREX  Take 500 mg by mouth 2 (two) times daily.           Follow-up Information   Follow up  with [facility], MD In 2 weeks. (Home Health Physical Therapy, Occupational Therapy, and RN)    Contact information:   [address] [place] [address] [phone]       Signed: Benedetto Goad 09/04/2012, 2:29 PM

## 2012-09-04 NOTE — Progress Notes (Signed)
Subjective: 2 Days Post-Op Procedure(s) (LRB): TOTAL KNEE ARTHROPLASTY- right (Right) Patient reports pain as 5 on 0-10 scale.    Objective: Vital signs in last 24 hours: Temp:  [98.5 F (36.9 C)-99.3 F (37.4 C)] 98.8 F (37.1 C) (07/18 0600) Pulse Rate:  [93-104] 104 (07/18 0957) Resp:  [16-18] 18 (07/18 0600) BP: (107-129)/(56-63) 129/56 mmHg (07/18 0957) SpO2:  [92 %-96 %] 92 % (07/18 0900)  Intake/Output from previous day: 07/17 0701 - 07/18 0700 In: 480 [P.O.:480] Out: -  Intake/Output this shift:     Recent Labs  09/02/12 1830 09/03/12 0605 09/04/12 0545  HGB 11.0* 10.4* 10.0*    Recent Labs  09/03/12 0605 09/04/12 0545  WBC 15.0* 17.0*  RBC 3.43* 3.27*  HCT 30.8* 28.9*  PLT 205 196    Recent Labs  09/03/12 0605 09/04/12 0545  NA 134* 129*  K 4.2 4.1  CL 99 95*  CO2 25 27  BUN 15 11  CREATININE 0.75 0.82  GLUCOSE 160* 187*  CALCIUM 8.2* 8.2*    Recent Labs  09/03/12 0605 09/04/12 0545  INR 1.12 1.28    Neurovascular intact Incision: moderate drainage and staples intact  Assessment/Plan: 2 Days Post-Op Procedure(s) (LRB): TOTAL KNEE ARTHROPLASTY- right (Right) Up with therapy Plan for discharge tomorrow with home health  ROBERTS,JANE B 09/04/2012, 10:11 AM

## 2012-09-04 NOTE — Progress Notes (Addendum)
Occupational Therapy Evaluation Patient Details Name: Carol Richmond MRN: [id number] DOB: Jul 17, 1950 Today's Date: 09/04/2012 Time: 1626-1700 OT Time Calculation (min): 34 min  OT Assessment / Plan / Recommendation History of present illness s/p elective R TKA   Clinical Impression   PTA, pt independent with mobility and ADL.  Pt presents requiring mod A with LB ADL and mobility for ADL. Pt will benefit from skilled OT services to facilitate D/C to home with HHOT due to below deficits. Pt requiring mod A this pm with sit - stand from bed and physical assist to manually advance walker and continuous vc for sequence for ambulating. Husband states that pt was having problems with memory PTA, but that ist is much worse at this time. Discussed cognitive status with nsg. Question if this is exaggeratted by meds. Will plan to see in am    OT Assessment  Patient needs continued OT Services    Follow Up Recommendations  Home health OT    Barriers to Discharge      Equipment Recommendations  Tub/shower bench    Recommendations for Other Services    Frequency  Min 2X/week    Precautions / Restrictions Precautions Precautions: Fall;Knee Precaution Comments: pt given TKA exercise protocol; no pillow under knee Required Braces or Orthoses: Knee Immobilizer - Right Knee Immobilizer - Right: Other (comment) (not specified by MD) Restrictions Weight Bearing Restrictions: Yes RLE Weight Bearing: Weight bearing as tolerated   Pertinent Vitals/Pain no apparent distress     ADL  Lower Body Bathing: Moderate assistance Where Assessed - Lower Body Bathing: Supported sit to stand Lower Body Dressing: Moderate assistance Where Assessed - Lower Body Dressing: Supported sit to Lobbyist Method: Sit to Loss adjuster, chartered: Therapist, occupational and Hygiene: Moderate assistance Equipment Used: Gait belt;Knee Immobilizer;Rolling  walker Transfers/Ambulation Related to ADLs: mod A for sit - stand and mod A for ambulation due to haviing to physically advance RW for pt due to cognitive status ADL Comments: limited by immobility and apparent confusion    OT Diagnosis: Generalized weakness;Acute pain  OT Problem List: Decreased strength;Decreased range of motion;Decreased activity tolerance;Impaired balance (sitting and/or standing);Decreased cognition;Decreased safety awareness;Decreased knowledge of use of DME or AE;Decreased knowledge of precautions;Obesity;Pain OT Treatment Interventions: Self-care/ADL training;Therapeutic exercise;Energy conservation;DME and/or AE instruction;Therapeutic activities;Patient/family education;Balance training   OT Goals(Current goals can be found in the care plan section) Acute Rehab OT Goals Patient Stated Goal: to go home OT Goal Formulation: With patient Time For Goal Achievement: 09/18/12 Potential to Achieve Goals: Good  Visit Information  Last OT Received On: 09/04/12 Assistance Needed: +1 History of Present Illness: s/p elective R TKA       Prior Functioning     Home Living Family/patient expects to be discharged to:: Private residence Living Arrangements: Spouse/significant other Available Help at Discharge: Family;Available 24 hours/day Type of Home: House Home Access: Stairs to enter [employer] of Steps: 2 in back (5 in front) Entrance Stairs-Rails: Can reach both Home Layout: One level Home Equipment: Walker - 2 wheels;Bedside commode;Shower seat Prior Function Level of Independence: Independent Communication Communication: HOH         Vision/Perception     Cognition  Cognition Arousal/Alertness: Awake/alert Behavior During Therapy: Anxious;Flat affect Overall Cognitive Status: Within Functional Limits for tasks assessed Memory: Decreased recall of precautions;Decreased short-term memory    Extremity/Trunk Assessment Upper Extremity  Assessment Upper Extremity Assessment: Overall WFL for tasks assessed Lower Extremity Assessment RLE Deficits / Details:  knee grossly 3-/5; unable to perform LAQ at this time; ankle DF/PF [facility] RLE Sensation:  (WFL to light touch) Cervical / Trunk Assessment Cervical / Trunk Assessment: Normal     Mobility Bed Mobility Bed Mobility: Supine to Sit;Sit to Supine Supine to Sit: 5: Supervision;HOB flat Sit to Supine: HOB flat;5: Supervision Details for Bed Mobility Assistance: min cues for hand placement and sequencing; pt encouraged to perform bed mobility like she would have to at home (without HOB elevated and rails); max encouragement required Transfers Sit to Stand: From bed;3: Mod assist Stand to Sit: To bed;3: Mod assist Details for Transfer Assistance: mod A to control descent     Exercise     Balance Balance Balance Assessed: Yes Static Standing Balance Static Standing - Balance Support: Bilateral upper extremity supported;During functional activity Static Standing - Level of Assistance: 5: Stand by assistance   End of Session OT - End of Session Equipment Utilized During Treatment: Gait belt;Rolling walker Activity Tolerance: Patient tolerated treatment well Patient left: in chair;with call bell/phone within reach;with family/visitor present Nurse Communication: Mobility status;Other (comment) (concerns over cognition)  GO     Estefana Taylor,HILLARY 09/04/2012, 5:20 PM [facility], OTR/L  [phone] 09/04/2012 [place], OTR/L  [phone] 09/04/2012

## 2012-09-05 LAB — BASIC METABOLIC PANEL
CO2: 26 mEq/L (ref 19–32)
GFR calc non Af Amer: 73 mL/min — ABNORMAL LOW (ref >90)
Glucose, Bld: 139 mg/dL — ABNORMAL HIGH (ref 70–99)
Potassium: 3.4 mEq/L — ABNORMAL LOW (ref 3.5–5.1)
Sodium: 133 mEq/L — ABNORMAL LOW (ref 135–145)

## 2012-09-05 LAB — CBC
Hemoglobin: 9.8 g/dL — ABNORMAL LOW (ref 12.0–15.0)
MCHC: 34.8 g/dL (ref 30.0–36.0)
Platelets: 197 10*3/uL (ref 150–400)
RBC: 3.21 MIL/uL — ABNORMAL LOW (ref 3.87–5.11)

## 2012-09-05 LAB — PROTIME-INR
INR: 1.57 — ABNORMAL HIGH (ref 0.00–1.49)
Prothrombin Time: 18.3 seconds — ABNORMAL HIGH (ref 11.6–15.2)

## 2012-09-05 MED ORDER — WARFARIN SODIUM 5 MG PO TABS
5.0000 mg | ORAL_TABLET | Freq: Once | ORAL | Status: DC
  Filled 2012-09-05: qty 1

## 2012-09-05 NOTE — Progress Notes (Signed)
Occupational Therapy Treatment Patient Details Name: Carol Richmond MRN: [id number] DOB: [birth year] Today's Date: 09/05/2012 Time: 0930-1007 OT Time Calculation (min): 37 min  OT Assessment / Plan / Recommendation  OT comments  Practiced tub transfer with tub bench and toileting tasks. Spoke with nurse about pt's cognition. Pt having difficulty following commands and recalling safe instructions. Pt is a fall risk and will require 24/7 Assistance. Family aware and verbalized understanding of this.   Follow Up Recommendations  Home health OT    Barriers to Discharge       Equipment Recommendations  Tub/shower bench;3 in 1 bedside comode    Recommendations for Other Services    Frequency Min 2X/week   Progress towards OT Goals Progress towards OT goals: Progressing toward goals  Plan Discharge plan remains appropriate    Precautions / Restrictions Precautions Precautions: Fall;Knee Required Braces or Orthoses: Knee Immobilizer - Right Knee Immobilizer - Right: Other (comment) (not specified by MD) Restrictions Weight Bearing Restrictions: Yes RLE Weight Bearing: Weight bearing as tolerated   Pertinent Vitals/Pain 6/10 pain in knee. Premedicated. Repositioned.     ADL  Toilet Transfer: Performed;Minimal assistance Toilet Transfer Method: Sit to stand Toilet Transfer Equipment: Raised toilet seat with arms (or 3-in-1 over toilet) Toileting - Clothing Manipulation and Hygiene: Minimal assistance (balance) Where Assessed - Toileting Clothing Manipulation and Hygiene: Sit to stand from 3-in-1 or toilet Tub/Shower Transfer: Performed;Moderate assistance Tub/Shower Transfer Method: Therapist, art: IT consultant Used: Gait belt;Knee Immobilizer;Rolling walker Transfers/Ambulation Related to ADLs: Min A for ambulation and transfers. ADL Comments: Practiced tub transfer with tub bench and pt requiring Mod A for cues and also to assist RLE  into tub. Spouse feels comfortable assisting. Husband states he is going to help with LB ADLs. OT recommending having chair or bed behind her when standing to pull up pants with walker in front and husband with her.  Spoke with family and educated that pt needs someone with her 24/7.    OT Diagnosis:    OT Problem List:   OT Treatment Interventions:     OT Goals(current goals can now be found in the care plan section) Acute Rehab OT Goals Patient Stated Goal: home  OT Goal Formulation: With patient Time For Goal Achievement: 09/18/12 Potential to Achieve Goals: Good ADL Goals Pt Will Transfer to Toilet: ambulating;bedside commode (with caregiver independent in assisting) Pt Will Perform Toileting - Clothing Manipulation and hygiene: with caregiver independent in assisting;with min guard assist Pt Will Perform Tub/Shower Transfer: with caregiver independent in assisting;Tub transfer;ambulating;3 in 1  Visit Information  Last OT Received On: 09/05/12 Assistance Needed: +1 History of Present Illness: s/p elective R TKA    Subjective Data      Prior Functioning       Cognition  Cognition Arousal/Alertness: Awake/alert Behavior During Therapy: Anxious;Flat affect Overall Cognitive Status: Impaired/Different from baseline Area of Impairment: Following commands;Problem solving;Safety/judgement Memory: Decreased recall of precautions;Decreased short-term memory Following Commands: Follows one step commands inconsistently;Follows one step commands with increased time Safety/Judgement: Decreased awareness of safety Problem Solving: Slow processing;Requires verbal cues;Requires tactile cues    Mobility  Bed Mobility Bed Mobility: Not assessed Transfers Transfers: Sit to Stand;Stand to Sit Sit to Stand: 4: Min assist;With upper extremity assist;From chair/3-in-1 Stand to Sit: 4: Min assist;With upper extremity assist;To chair/3-in-1 Details for Transfer Assistance: Several cues for  hand placement; decreased safety awareness; husband giving her cues as well during session for hand placement.  End of Session OT - End of Session Equipment Utilized During Treatment: Gait belt;Rolling walker;Right knee immobilizer Activity Tolerance: Patient tolerated treatment well Patient left: in chair;with call bell/phone within reach;with family/visitor present Nurse Communication: Mobility status;Other (comment) (cognitive status) CPM Right Knee CPM Right Knee: Off  GO     Benito Mccreedy OTR/L [phone] 09/05/2012, 11:27 AM

## 2012-09-05 NOTE — Discharge Summary (Signed)
[name] MRN: [id number] DOB/AGE: 1950/06/19 62 y.o.  Admit date: 09/02/2012 Discharge date: 09/05/2012  Admission Diagnoses:  Principal Problem:   Osteoarthritis of right knee   Discharge Diagnoses:  Same  Past Medical History  Diagnosis Date  . GERD (gastroesophageal reflux disease)   . Diarrhea   . Hypertension   . Hormone replacement therapy (postmenopausal)   . Migraine   . Seizures   . IBS (irritable bowel syndrome)   . Functional ovarian cysts   . Diverticulosis of colon (without mention of hemorrhage)   . Hemorrhoids, external, without mention of complication   . Right knee DJD   . Shortness of breath   . Asthma   . Diabetes mellitus without complication   . Sleep apnea     no cpap    study 2 yrs ago    Surgeries: Procedure(s): TOTAL KNEE ARTHROPLASTY- right on 09/02/2012   Consultants:    Discharged Condition: Improved  Hospital Course: Carol Richmond is an 62 y.o. female who was admitted 09/02/2012 for operative treatment ofOsteoarthritis of right knee. Patient has severe unremitting pain that affects sleep, daily activities, and work/hobbies. After pre-op clearance the patient was taken to the operating room on 09/02/2012 and underwent  Procedure(s): TOTAL KNEE ARTHROPLASTY- right.    Patient was given perioperative antibiotics: Anti-infectives   Start     Dose/Rate Route Frequency Ordered Stop   09/02/12 2200  valACYclovir (VALTREX) tablet 500 mg     500 mg Oral 2 times daily 09/02/12 1740     09/02/12 1745  ceFAZolin (ANCEF) IVPB 1 g/50 mL premix     1 g 100 mL/hr over 30 Minutes Intravenous Every 6 hours 09/02/12 1740 09/02/12 2236   09/02/12 0600  ceFAZolin (ANCEF) IVPB 2 g/50 mL premix     2 g 100 mL/hr over 30 Minutes Intravenous On call to O.R. 09/01/12 1426 09/02/12 1146       Patient was given sequential compression devices, early ambulation, and chemoprophylaxis to prevent DVT.  Patient benefited maximally from  hospital stay and there were no complications.    Recent vital signs: Patient Vitals for the past 24 hrs:  BP Temp Temp src Pulse Resp SpO2  09/05/12 0852 - - - - - 96 %  09/05/12 0800 - - - - 18 96 %  09/05/12 0522 111/60 mmHg 98 F (36.7 C) Oral 96 18 96 %  09/04/12 2000 - - - - 18 96 %  09/04/12 1900 107/54 mmHg 98.7 F (37.1 C) Oral 98 18 96 %  09/04/12 1330 112/60 mmHg 99.5 F (37.5 C) Oral 101 18 93 %     Recent laboratory studies:  Recent Labs  09/04/12 0545 09/05/12 0530  WBC 17.0* 13.4*  HGB 10.0* 9.8*  HCT 28.9* 28.2*  PLT 196 197  NA 129* 133*  K 4.1 3.4*  CL 95* 98  CO2 27 26  BUN 11 13  CREATININE 0.82 0.85  GLUCOSE 187* 139*  INR 1.28 1.57*  CALCIUM 8.2* 8.3*     Discharge Medications:     Medication List    STOP taking these medications       cholecalciferol 1000 UNITS tablet  Commonly known as:  VITAMIN D      TAKE these medications       DSS 100 MG Caps  Take 100 mg by mouth 2 (two) times daily.     enoxaparin 30 MG/0.3ML injection  Commonly known as:  LOVENOX  Inject  0.3 mLs (30 mg total) into the skin daily.     escitalopram 20 MG tablet  Commonly known as:  LEXAPRO  Take 20 mg by mouth daily with breakfast.     estradiol 0.5 MG tablet  Commonly known as:  ESTRACE  Take 0.5 mg by mouth daily.     lamoTRIgine 200 MG tablet  Commonly known as:  LAMICTAL  Take 200 mg by mouth 2 (two) times daily.     lisinopril-hydrochlorothiazide 20-12.5 MG per tablet  Commonly known as:  PRINZIDE,ZESTORETIC  Take 1 tablet by mouth daily.     LORazepam 2 MG tablet  Commonly known as:  ATIVAN  Take 2 mg by mouth 2 (two) times daily.     METFORMIN HCL PO  Take 1,000 mg by mouth 2 (two) times daily before a meal.     mometasone-formoterol 100-5 MCG/ACT Aero  Commonly known as:  DULERA  Inhale 2 puffs into the lungs 2 (two) times daily.     omeprazole 20 MG capsule  Commonly known as:  PRILOSEC  Take 20 mg by mouth daily.      oxyCODONE-acetaminophen 5-325 MG per tablet  Commonly known as:  ROXICET  Take 1 tablet by mouth every 4 (four) hours as needed for pain.     simvastatin 20 MG tablet  Commonly known as:  ZOCOR  Take 20 mg by mouth every evening.     valACYclovir 500 MG tablet  Commonly known as:  VALTREX  Take 500 mg by mouth 2 (two) times daily.     warfarin 5 MG tablet  Commonly known as:  COUMADIN  Take 1 tablet (5 mg total) by mouth daily. Or as directed by pharmacy        Diagnostic Studies: Dg Chest 2 View  08/27/2012   *RADIOLOGY REPORT*  Clinical Data: Preoperative evaluation.  History of hypertension, shortness of breath, and diabetes.  CHEST - 2 VIEW  Comparison: 12/19/2011.CT 12/12/2011.CT 08/31/2004.  Findings: Cardiac silhouette is upper normal size. Ectasia and nonaneurysmal calcification of the thoracic aorta are seen. Mediastinal and hilar contours appear stable.  Density projects over the anterior inferior aspect of the heart on the lateral image. The review of previous CT examination shows prominent epicardial fat accumulations anteriorly accounting for these opacities.  There is prominence of the reticular interstitial markings with central peribronchial thickening.  No consolidation or pleural effusion is seen.  There is slightly osteopenic appearance of the bones.  There is minimal degenerative spondylosis.  Calcified granulomas are present.  No active granulomatous process is identified.  IMPRESSION: Prominence of reticular interstitial markings centrally with central peribronchial thickening.  This may be associated with bronchitis, asthma, and reactive airway disease.  No consolidation or pleural effusion.  Chronic findings are detailed above.   Original Report Authenticated By: Shanon Brow Call   Dg Knee Right Port  09/02/2012   *RADIOLOGY REPORT*  Clinical Data: Status post right total knee arthroplasty.  PORTABLE RIGHT KNEE - 1-2 VIEW  Comparison: MRI of the right knee 08/23/2011.   Findings: The patient is status post right total knee arthroplasty. Surgical drain is in place.  Gas and fluid is present in the joint space.  The knee is located.  The joint space is preserved.  IMPRESSION:  1.  Status post right total knee arthroplasty without radiographic evidence for complication.   Original Report Authenticated By: San Morelle, M.D.    Disposition:       Discharge Orders   Future Orders Complete  By Expires     CPM  As directed     Comments:      Continuous passive motion machine (CPM):      Use the CPM from 0 to 60 for 6-8 hours per day.      You may increase by 5-10 per day.  You may break it up into 2 or 3 sessions per day.      Use CPM for 3-4 weeks or until you are told to stop.    CPM  As directed     Comments:      Continuous passive motion machine (CPM):      Use the CPM from 0 to 90 for 6 hours per day.       You may break it up into 2 or 3 sessions per day.      Use CPM for 2 weeks or until you are told to stop.    Call MD / Call 911  As directed     Comments:      If you experience chest pain or shortness of breath, CALL 911 and be transported to the hospital emergency room.  If you develope a fever above 101 F, pus (white drainage) or increased drainage or redness at the wound, or calf pain, call your surgeon's office.    Call MD / Call 911  As directed     Comments:      If you experience chest pain or shortness of breath, CALL 911 and be transported to the hospital emergency room.  If you develope a fever above 101 F, pus (white drainage) or increased drainage or redness at the wound, or calf pain, call your surgeon's office.    Change dressing  As directed     Comments:      Change the dressing daily with sterile 4 x 4 inch gauze dressing and apply TED hose.  You may clean the incision with alcohol prior to redressing.    Change dressing  As directed     Comments:      Change the dressing daily with sterile 4 x 4 inch gauze dressing and  apply TED hose.  You may clean the incision with alcohol prior to redressing.    Constipation Prevention  As directed     Comments:      Drink plenty of fluids.  Prune juice and/or coffee may be helpful.  You may use a stool softener, such as Colace (over the counter) 100 mg twice a day.  Use MiraLax (over the counter) for constipation as needed but this may take several days to work.  Mag Citrate --OR-- Milk of Magnesia may also be used but follow directions on the label.    Constipation Prevention  As directed     Comments:      Drink plenty of fluids.  Prune juice may be helpful.  You may use a stool softener, such as Colace (over the counter) 100 mg twice a day.  Use MiraLax (over the counter) for constipation as needed.    Diet - low sodium heart healthy  As directed     Diet - low sodium heart healthy  As directed     Discharge instructions  As directed     Comments:      Total Knee Replacement Care After Refer to this sheet in the next few weeks. These discharge instructions provide you with general information on caring for yourself after you leave the hospital. Your caregiver may  also give you specific instructions. Your treatment has been planned according to the most current medical practices available, but unavoidable complications sometimes occur. If you have any problems or questions after discharge, please call your caregiver. Regaining a near full range of motion of your knee within the first 3 to 6 weeks after surgery is critical. [place] may resume a normal diet and activities as directed.  Perform exercises as directed.  Place yellow foam block, yellow side up under heel at all times except when in CPM or when walking.  DO NOT modify, tear, cut, or change in any way. You will receive physical therapy daily  Take showers instead of baths until informed otherwise.  Change bandages (dressings)daily Do not take over-the-counter or prescription medicines for  pain, discomfort, or fever. Eat a well-balanced diet.  Avoid lifting or driving until you are instructed otherwise.  Make an appointment to see your caregiver for stitches (suture) or staple removal as directed.  If you have been sent home with a continuous passive motion machine (CPM machine), 0-90 degrees 6 hrs a day   2 hrs a shift SEEK MEDICAL CARE IF: You have swelling of your calf or leg.  You develop shortness of breath or chest pain.  You have redness, swelling, or increasing pain in the wound.  There is pus or any unusual drainage coming from the surgical site.  You notice a bad smell coming from the surgical site or dressing.  The surgical site breaks open after sutures or staples have been removed.  There is persistent bleeding from the suture or staple line.  You are getting worse or are not improving.  You have any other questions or concerns.  SEEK IMMEDIATE MEDICAL CARE IF:  You have a fever.  You develop a rash.  You have difficulty breathing.  You develop any reaction or side effects to medicines given.  Your knee motion is decreasing rather than improving.  MAKE SURE YOU:  Understand these instructions.  Will watch your condition.  Will get help right away if you are not doing well or get worse. Change the dressing daily with sterile 4 x 4 inch gauze dressing and apply TED hose.  You may clean the incision with alcohol prior to redressing.Continuous passive motion machine (CPM):      Use the CPM from 0 to 90 for 6 hours per day.       You may break it up into 2 or 3 sessions per day.      Use CPM for 2 weeks or until you are told to stop.Continuous passive motion machine (CPM):      Use the CPM from 0 to 90 for 6 hours per day.       You may break it up into 2 or 3 sessions per day.      Use CPM for 2 weeks or until you are told to stop.Use stockings (TED hose) for 2 weeks on both leg(s).  You may remove them at night for sleeping.    Discharge instructions  As  directed     Comments:      Total Knee Replacement Care After Refer to this sheet in the next few weeks. These discharge instructions provide you with general information on caring for yourself after you leave the hospital. Your caregiver may also give you specific instructions. Your treatment has been planned according to the most current medical practices available, but unavoidable complications sometimes occur. If you have  any problems or questions after discharge, please call your caregiver. Regaining a near full range of motion of your knee within the first 3 to 6 weeks after surgery is critical. [place] may resume a normal diet and activities as directed.  Perform exercises as directed.  Place yellow foam block, yellow side up under heel at all times except when in CPM or when walking.  DO NOT modify, tear, cut, or change in any way. You will receive physical therapy daily  Take showers instead of baths until informed otherwise.  Change bandages (dressings)daily Do not take over-the-counter or prescription medicines for pain, discomfort, or fever. Eat a well-balanced diet.  Avoid lifting or driving until you are instructed otherwise.  Make an appointment to see your caregiver for stitches (suture) or staple removal as directed.  If you have been sent home with a continuous passive motion machine (CPM machine), 0-90 degrees 6 hrs a day   2 hrs a shift SEEK MEDICAL CARE IF: You have swelling of your calf or leg.  You develop shortness of breath or chest pain.  You have redness, swelling, or increasing pain in the wound.  There is pus or any unusual drainage coming from the surgical site.  You notice a bad smell coming from the surgical site or dressing.  The surgical site breaks open after sutures or staples have been removed.  There is persistent bleeding from the suture or staple line.  You are getting worse or are not improving.  You have any other questions or  concerns.  SEEK IMMEDIATE MEDICAL CARE IF:  You have a fever.  You develop a rash.  You have difficulty breathing.  You develop any reaction or side effects to medicines given.  Your knee motion is decreasing rather than improving.  MAKE SURE YOU:  Understand these instructions.  Will watch your condition.  Will get help right away if you are not doing well or get worse.    Do not put a pillow under the knee. Place it under the heel.  As directed     Comments:      Place yellow block under heel at all times except when up walking or in CPM.  You must sleep in it at night    Do not put a pillow under the knee. Place it under the heel.  As directed     Increase activity slowly as tolerated  As directed     Increase activity slowly as tolerated  As directed     Patient may shower  As directed     Comments:      You may shower over the brown dressing.  Once the dressing is removed you may shower without a dressing once there is no drainage.  Do not wash over the wound.  If drainage remains, cover wound with plastic wrap and then shower    TED hose  As directed     Comments:      Use stockings (TED hose) for 2 weeks on both leg(s).  You may remove them at night for sleeping.    TED hose  As directed     Comments:      Use stockings (TED hose) for 2 weeks on both leg(s).  You may remove them at night for sleeping.       Follow-up Information   Follow up with [facility], MD In 2 weeks. (Home Health Physical Therapy, Occupational Therapy, and RN)  Contact information:   [place] [address] [phone]        Signed: Linda Hedges 09/05/2012, 11:29 AM

## 2012-09-05 NOTE — Progress Notes (Signed)
Physical Therapy Treatment Patient Details Name: Carol Richmond MRN: [id number] DOB: March 03, 1950 Today's Date: 09/05/2012 Time: 4562-5638 PT Time Calculation (min): 27 min  PT Assessment / Plan / Recommendation  PT Comments   Treatment session focused on family and pt education to increase safety with mobility and stair amb. Pt cont to have difficulties recalling safe instructions and following cues. Pt and family are adamant on returning home. Pt is a fall risk and will require 24/7 (A) for all transfers and mobility at home; family and pt verbalized understanding of this. Discussed car transfer techniques with family and pt. Pt will need to continue use of knee immobilizer till cleared by HHPT to increase stability and safety. Pt and family state they are comfortable and ready for D/C at this time.   Follow Up Recommendations  Home health PT;Supervision/Assistance - 24 hour     Does the patient have the potential to tolerate intense rehabilitation     Barriers to Discharge        Equipment Recommendations  None recommended by PT    Recommendations for Other Services    Frequency 7X/week   Progress towards PT Goals Progress towards PT goals: Progressing toward goals  Plan Current plan remains appropriate    Precautions / Restrictions Precautions Precautions: Fall;Knee Required Braces or Orthoses: Knee Immobilizer - Right Restrictions Weight Bearing Restrictions: Yes RLE Weight Bearing: Weight bearing as tolerated   Pertinent Vitals/Pain 10/10; pt medicated during therapy session by RN.     Mobility  Bed Mobility Bed Mobility: Not assessed Transfers Transfers: Sit to Stand;Stand to Sit Sit to Stand: From chair/3-in-1;4: Min guard;With upper extremity assist Stand to Sit: 4: Min assist;To chair/3-in-1;With armrests;With upper extremity assist Details for Transfer Assistance: pt cont to have difficulties with sit to stand transfers; requires min guard to min (A) to  maintain balance. demo decreased safety awareness; attempts to pull to stand with RW; mod cues for safety and hand placement; family educated to encourage safe hand placement during transfers Ambulation/Gait Ambulation/Gait Assistance: 4: Min assist Ambulation Distance (Feet): 100 Feet Assistive device: Rolling walker Ambulation/Gait Assistance Details: pt continues to amb with short shuffled gt; has difficulty processing cues and understanding demo to increase step length on L LE; pt with decreased heel strike on bil LEs; (A) to manage RW at times, especially with turns and obstacles; demo decreased safety awareness with gt; mod to max cues for sequencing and safety with RW; family and pt encouraged to have (A) with all mobility when D/C home to reduce risk of falls  Gait Pattern: Step-to pattern;Decreased stride length;Shuffle;Narrow base of support Gait velocity: decreased; unsafe to increase gt at this time  Stairs: Yes Stairs Assistance: 3: Mod assist Stairs Assistance Details (indicate cue type and reason): pt and family education provided with multimodal cues and handout; pt continues to present with delayed processing and understanding of cues and gt sequencing; pt demo decreased safety awareness and tends to only get heel on step; family able to recall proper gt sequencing and provide appropriate level of (A) for stair amb.  Stair Management Technique: No rails;Backwards;With walker;Step to pattern Number of Stairs: 2 Wheelchair Mobility Wheelchair Mobility: No    Exercises Total Joint Exercises Ankle Circles/Pumps: AROM;Both;10 reps;Seated Quad Sets: AROM;Right;10 reps;Seated Heel Slides: PROM;Right;10 reps;Seated Hip ABduction/ADduction: AAROM;Right;10 reps;Seated Long Arc Quad: Strengthening;Right;10 reps;Seated Goniometric ROM: PROM 2 to 90 degrees in sitting   PT Diagnosis:    PT Problem List:   PT Treatment Interventions:  PT Goals (current goals can now be found in the  care plan section) Acute Rehab PT Goals Patient Stated Goal: home  PT Goal Formulation: With patient Time For Goal Achievement: 09/10/12 Potential to Achieve Goals: Good  Visit Information  Last PT Received On: 09/05/12 Assistance Needed: +1 History of Present Illness: s/p elective R TKA    Subjective Data  Subjective: pt sitting in chair with husband present; "i hope i go home today. "  Patient Stated Goal: home    Cognition  Cognition Arousal/Alertness: Awake/alert Behavior During Therapy: Anxious;Flat affect Overall Cognitive Status: Within Functional Limits for tasks assessed Memory: Decreased recall of precautions;Decreased short-term memory    Balance  Balance Balance Assessed: Yes Static Standing Balance Static Standing - Balance Support: Bilateral upper extremity supported;During functional activity Static Standing - Level of Assistance: 4: Min assist  End of Session PT - End of Session Equipment Utilized During Treatment: Gait belt;Right knee immobilizer Activity Tolerance: Patient tolerated treatment well Patient left: in chair;with call bell/phone within reach;with family/visitor present Nurse Communication: Mobility status   GP     Gustavus Bryant, [place] 09/05/2012, 9:07 AM

## 2012-09-05 NOTE — Progress Notes (Signed)
ANTICOAGULATION CONSULT NOTE - Follow Up Consult  Pharmacy Consult for Coumadin Indication: VTE prophylaxis s/p R  TKA  No Known Allergies  Patient Measurements:     Vital Signs: Temp: 98 F (36.7 C) (07/19 0522) Temp src: Oral (07/19 0522) BP: 111/60 mmHg (07/19 0522) Pulse Rate: 96 (07/19 0522)  Labs:  Recent Labs  09/03/12 0605 09/04/12 0545 09/05/12 0530  HGB 10.4* 10.0* 9.8*  HCT 30.8* 28.9* 28.2*  PLT 205 196 197  LABPROT 14.2 15.7* 18.3*  INR 1.12 1.28 1.57*  CREATININE 0.75 0.82 0.85    Medications:  Scheduled:  . coumadin book   Does not apply Once  . docusate sodium  100 mg Oral BID  . enoxaparin (LOVENOX) injection  30 mg Subcutaneous Q24H  . escitalopram  20 mg Oral Q breakfast  . lisinopril  20 mg Oral Daily   And  . hydrochlorothiazide  12.5 mg Oral Daily  . insulin aspart  0-15 Units Subcutaneous TID WC  . insulin aspart  0-5 Units Subcutaneous QHS  . insulin aspart  3 Units Subcutaneous TID WC  . lamoTRIgine  200 mg Oral BID  . LORazepam  2 mg Oral BID  . metFORMIN  1,000 mg Oral BID WC  . mometasone-formoterol  2 puff Inhalation BID  . pantoprazole  40 mg Oral Daily  . simvastatin  20 mg Oral QPM  . valACYclovir  500 mg Oral BID  . warfarin   Does not apply Once  . Warfarin - Pharmacist Dosing Inpatient   Does not apply q1800    Assessment: 62 yo F continues on Coumadin for post-op VTE px s/p TKA.  INR trending up 1.57. Hgb down 9.8 plt 197. No reports of bleed.  Goal of Therapy:  INR 2-3 Monitor platelets by anticoagulation protocol: Yes   Plan:  Repeat Coumadin 5 mg PO x 1 tonight. Continue Lovenox SQ 30 mg q24 Continue daily INR.  Emmalou Hunger M. Posey Pronto, PharmD Clinical Pharmacist- Resident Pager: [phone] Pharmacy: [phone] 09/05/2012 9:25 AM

## 2012-09-05 NOTE — Progress Notes (Signed)
NCM spoke to pt and she does want to pay out of pocket for tub bench. Contacted [facility] for DME. Jonnie Finner RN CCM Case Mgmt phone [phone]

## 2013-05-08 ENCOUNTER — Emergency Department (HOSPITAL_COMMUNITY)
Admission: EM | Admit: 2013-05-08 | Discharge: 2013-05-09 | Disposition: A | Discharge status: Home / Self Care | Payer: Medicare Other | Attending: Emergency Medicine | Admitting: Emergency Medicine

## 2013-05-08 ENCOUNTER — Encounter (HOSPITAL_COMMUNITY): Payer: Self-pay | Admitting: Emergency Medicine

## 2013-05-08 DIAGNOSIS — I10 Essential (primary) hypertension: Secondary | ICD-10-CM | POA: Insufficient documentation

## 2013-05-08 DIAGNOSIS — G43909 Migraine, unspecified, not intractable, without status migrainosus: Secondary | ICD-10-CM | POA: Insufficient documentation

## 2013-05-08 DIAGNOSIS — E119 Type 2 diabetes mellitus without complications: Secondary | ICD-10-CM | POA: Insufficient documentation

## 2013-05-08 DIAGNOSIS — IMO0002 Reserved for concepts with insufficient information to code with codable children: Secondary | ICD-10-CM | POA: Insufficient documentation

## 2013-05-08 DIAGNOSIS — R2689 Other abnormalities of gait and mobility: Secondary | ICD-10-CM

## 2013-05-08 DIAGNOSIS — D649 Anemia, unspecified: Secondary | ICD-10-CM | POA: Insufficient documentation

## 2013-05-08 DIAGNOSIS — J45909 Unspecified asthma, uncomplicated: Secondary | ICD-10-CM | POA: Insufficient documentation

## 2013-05-08 DIAGNOSIS — R4701 Aphasia: Secondary | ICD-10-CM | POA: Insufficient documentation

## 2013-05-08 DIAGNOSIS — E871 Hypo-osmolality and hyponatremia: Secondary | ICD-10-CM

## 2013-05-08 DIAGNOSIS — K219 Gastro-esophageal reflux disease without esophagitis: Secondary | ICD-10-CM | POA: Insufficient documentation

## 2013-05-08 DIAGNOSIS — R29818 Other symptoms and signs involving the nervous system: Secondary | ICD-10-CM | POA: Insufficient documentation

## 2013-05-08 DIAGNOSIS — G40909 Epilepsy, unspecified, not intractable, without status epilepticus: Secondary | ICD-10-CM | POA: Insufficient documentation

## 2013-05-08 DIAGNOSIS — M1710 Unilateral primary osteoarthritis, unspecified knee: Secondary | ICD-10-CM | POA: Insufficient documentation

## 2013-05-08 DIAGNOSIS — Z8742 Personal history of other diseases of the female genital tract: Secondary | ICD-10-CM | POA: Insufficient documentation

## 2013-05-08 DIAGNOSIS — Z7982 Long term (current) use of aspirin: Secondary | ICD-10-CM | POA: Insufficient documentation

## 2013-05-08 DIAGNOSIS — Z79899 Other long term (current) drug therapy: Secondary | ICD-10-CM | POA: Insufficient documentation

## 2013-05-08 LAB — COMPREHENSIVE METABOLIC PANEL
ALK PHOS: 92 U/L (ref 39–117)
ALT: 55 U/L — ABNORMAL HIGH (ref 0–35)
AST: 72 U/L — ABNORMAL HIGH (ref 0–37)
Albumin: 3.7 g/dL (ref 3.5–5.2)
BILIRUBIN TOTAL: 0.2 mg/dL — AB (ref 0.3–1.2)
BUN: 16 mg/dL (ref 6–23)
CHLORIDE: 99 meq/L (ref 96–112)
CO2: 21 meq/L (ref 19–32)
Calcium: 9.2 mg/dL (ref 8.4–10.5)
Creatinine, Ser: 0.69 mg/dL (ref 0.50–1.10)
GFR calc non Af Amer: >90 mL/min (ref >90)
GLUCOSE: 249 mg/dL — AB (ref 70–99)
POTASSIUM: 3.9 meq/L (ref 3.7–5.3)
Sodium: 135 mEq/L — ABNORMAL LOW (ref 137–147)
Total Protein: 7.2 g/dL (ref 6.0–8.3)

## 2013-05-08 LAB — CBC WITH DIFFERENTIAL/PLATELET
Basophils Absolute: 0 10*3/uL (ref 0.0–0.1)
Basophils Relative: 0 % (ref 0–1)
Eosinophils Absolute: 0.1 10*3/uL (ref 0.0–0.7)
Eosinophils Relative: 1 % (ref 0–5)
HCT: 33.9 % — ABNORMAL LOW (ref 36.0–46.0)
HEMOGLOBIN: 11.5 g/dL — AB (ref 12.0–15.0)
LYMPHS ABS: 3.8 10*3/uL (ref 0.7–4.0)
Lymphocytes Relative: 33 % (ref 12–46)
MCH: 29.1 pg (ref 26.0–34.0)
MCHC: 33.9 g/dL (ref 30.0–36.0)
MCV: 85.8 fL (ref 78.0–100.0)
MONOS PCT: 14 % — AB (ref 3–12)
Monocytes Absolute: 1.7 10*3/uL — ABNORMAL HIGH (ref 0.1–1.0)
NEUTROS ABS: 6.2 10*3/uL (ref 1.7–7.7)
NEUTROS PCT: 52 % (ref 43–77)
Platelets: 203 10*3/uL (ref 150–400)
RBC: 3.95 MIL/uL (ref 3.87–5.11)
RDW: 14.4 % (ref 11.5–15.5)
WBC: 11.8 10*3/uL — ABNORMAL HIGH (ref 4.0–10.5)

## 2013-05-08 LAB — URINALYSIS, ROUTINE W REFLEX MICROSCOPIC
BILIRUBIN URINE: NEGATIVE
GLUCOSE, UA: NEGATIVE mg/dL
Hgb urine dipstick: NEGATIVE
Ketones, ur: NEGATIVE mg/dL
LEUKOCYTES UA: NEGATIVE
NITRITE: NEGATIVE
PH: 5.5 (ref 5.0–8.0)
Protein, ur: NEGATIVE mg/dL
SPECIFIC GRAVITY, URINE: 1.013 (ref 1.005–1.030)
Urobilinogen, UA: 0.2 mg/dL (ref 0.0–1.0)

## 2013-05-08 NOTE — ED Notes (Addendum)
Pt family states that she had absence seizures but the episodes she has been having have not been the same as her seizures. Pt states that she cannot describe the feeling but she just doesn't remember what she is doing to talking about. Pt started Glimepiride a week ago and is worried that it may be interacting with other medication.

## 2013-05-08 NOTE — ED Notes (Signed)
Pt states that she was at [employer] (15:00 this afternoon) and she started rocking while standing. Pt states that it has been difficult walking. Pt complaining of HA and not able to get her words out for several days. Pt just not acting right according to family.

## 2013-05-09 ENCOUNTER — Emergency Department (HOSPITAL_COMMUNITY): Payer: Medicare Other

## 2013-05-09 NOTE — Discharge Instructions (Signed)
Continue taking all of your current medications. Make an appointment with the neurologist for outpatient evaluation.

## 2013-05-09 NOTE — ED Provider Notes (Signed)
CSN: [id number]     Arrival date & time 05/08/13  2120 History   First MD Initiated Contact with Patient 05/09/13 0001     Chief Complaint  Patient presents with  . Headache     (Consider location/radiation/quality/duration/timing/severity/associated sxs/prior Treatment) Patient is a 63 y.o. female presenting with headaches. The history is provided by the patient, a relative and the spouse.  Headache She has been having spells for approximately 6 months where her balance is off and she has difficulty with her speech. Speech is sometimes slurred but more often, she is not able to find appropriate words. She says that even when she's not having any spells, her balance is still off and she still has some speech difficulty. She also has been having intermittent headaches. Headaches are sometimes at the vertex and sometimes on either side. She currently has a headache but cannot describe it and cannot give me a number for the pain and could only state that it hurts. She has a history of petit mal seizures but these spells are different. Episodes can last up to 2 hours and seemed to be coming more frequently. There's been no fever or chills. There's been no nausea or vomiting. She denies any visual change. She has discussed this with her PCP who is not on any investigation.  Past Medical History  Diagnosis Date  . GERD (gastroesophageal reflux disease)   . Diarrhea   . Hypertension   . Hormone replacement therapy (postmenopausal)   . Migraine   . Seizures   . IBS (irritable bowel syndrome)   . Functional ovarian cysts   . Diverticulosis of colon (without mention of hemorrhage)   . Hemorrhoids, external, without mention of complication   . Right knee DJD   . Shortness of breath   . Asthma   . Diabetes mellitus without complication   . Sleep apnea     no cpap    study 2 yrs ago   Past Surgical History  Procedure Laterality Date  . Cholecystectomy  1980  . Vaginal hysterectomy  1990  .  Tonsillectomy    . Oophorectomy      2012 Dr. Adah Perl in [place]  . Tubal ligation    . Total knee arthroplasty Right 09/02/2012    Procedure: TOTAL KNEE ARTHROPLASTY- right;  Surgeon: Ninetta Lights, MD;  Location: [facility];  Service: Orthopedics;  Laterality: Right;   Family History  Problem Relation Age of Onset  . Heart disease Mother   . Diabetes Mother   . Heart disease Brother   . Cancer Sister   . Heart disease Brother   . Heart disease Brother    History  Substance Use Topics  . Smoking status: Never Smoker   . Smokeless tobacco: Never Used  . Alcohol Use: No   OB History   Grav Para Term Preterm Abortions TAB SAB Ect Mult Living                 Review of Systems  Neurological: Positive for headaches.  All other systems reviewed and are negative.      Allergies  Review of patient's allergies indicates no known allergies.  Home Medications   Current Outpatient Rx  Name  Route  Sig  Dispense  Refill  . albuterol (PROVENTIL HFA;VENTOLIN HFA) 108 (90 BASE) MCG/ACT inhaler   Inhalation   Inhale 2 puffs into the lungs every 6 (six) hours as needed for wheezing or shortness of breath.         [name]  aspirin EC 81 MG tablet   Oral   Take 81 mg by mouth daily.         . cholecalciferol (VITAMIN D) 1000 UNITS tablet   Oral   Take 1,000 Units by mouth daily.         [name] escitalopram (LEXAPRO) 20 MG tablet   Oral   Take 20 mg by mouth daily with breakfast.          . glimepiride (AMARYL) 2 MG tablet   Oral   Take 2 mg by mouth 2 (two) times daily.         [name] lamoTRIgine (LAMICTAL) 200 MG tablet   Oral   Take 200 mg by mouth 2 (two) times daily.          [name] lisinopril-hydrochlorothiazide (PRINZIDE,ZESTORETIC) 20-12.5 MG per tablet   Oral   Take 1 tablet by mouth daily.          [name] LORazepam (ATIVAN) 2 MG tablet   Oral   Take 2 mg by mouth 2 (two) times daily.         [name] METFORMIN HCL PO   Oral   Take 1,000 mg by mouth 2 (two) times daily before  a meal.          . mometasone-formoterol (DULERA) 100-5 MCG/ACT AERO   Inhalation   Inhale 2 puffs into the lungs 2 (two) times daily.         [name] omeprazole (PRILOSEC) 20 MG capsule   Oral   Take 20 mg by mouth daily.         . simvastatin (ZOCOR) 20 MG tablet   Oral   Take 20 mg by mouth every evening.         . valACYclovir (VALTREX) 500 MG tablet   Oral   Take 500 mg by mouth daily.           BP 121/96  Pulse 92  Temp(Src) 98.6 F (37 C) (Oral)  Resp 16  Ht 5' 4"  (1.626 m)  Wt 195 lb 3 oz (88.536 kg)  BMI 33.49 kg/m2  SpO2 96% Physical Exam  Nursing note and vitals reviewed.  63 year old female, resting comfortably and in no acute distress. Vital signs are significant for diastolic hypertension with blood pressure 121/96. Oxygen saturation is 96%, which is normal. Head is normocephalic and atraumatic. PERRLA, EOMI. Oropharynx is clear. Fundi show no hemorrhage, exudate, or papilledema. Neck is nontender and supple without adenopathy or JVD. There no carotid bruits. Back is nontender and there is no CVA tenderness. Lungs are clear without rales, wheezes, or rhonchi. Chest is nontender. Heart has regular rate and rhythm without murmur. Abdomen is soft, flat, nontender without masses or hepatosplenomegaly and peristalsis is normoactive. Extremities have no cyanosis or edema, full range of motion is present. Skin is warm and dry without rash. Neurologic: She is awake, alert, oriented x3. Speech has normal comprehension normal content but she has difficulty naming a stethoscope. Cranial nerves are intact, there are no motor or sensory deficits. On Romberg testing, she is generally unsteady with a slight tendency to fall backwards. Finger-nose testing is normal. There is no pronator drift.  ED Course  Procedures (including critical care time) Labs Review Results for orders placed during the hospital encounter of 05/08/13  URINALYSIS, ROUTINE W REFLEX MICROSCOPIC       Result Value Ref Range   Color, Urine YELLOW  YELLOW   APPearance CLEAR  CLEAR   Specific Gravity,  Urine 1.013  1.005 - 1.030   pH 5.5  5.0 - 8.0   Glucose, UA NEGATIVE  NEGATIVE mg/dL   Hgb urine dipstick NEGATIVE  NEGATIVE   Bilirubin Urine NEGATIVE  NEGATIVE   Ketones, ur NEGATIVE  NEGATIVE mg/dL   Protein, ur NEGATIVE  NEGATIVE mg/dL   Urobilinogen, UA 0.2  0.0 - 1.0 mg/dL   Nitrite NEGATIVE  NEGATIVE   Leukocytes, UA NEGATIVE  NEGATIVE  CBC WITH DIFFERENTIAL      Result Value Ref Range   WBC 11.8 (*) 4.0 - 10.5 K/uL   RBC 3.95  3.87 - 5.11 MIL/uL   Hemoglobin 11.5 (*) 12.0 - 15.0 g/dL   HCT 33.9 (*) 36.0 - 46.0 %   MCV 85.8  78.0 - 100.0 fL   MCH 29.1  26.0 - 34.0 pg   MCHC 33.9  30.0 - 36.0 g/dL   RDW 14.4  11.5 - 15.5 %   Platelets 203  150 - 400 K/uL   Neutrophils Relative % 52  43 - 77 %   Neutro Abs 6.2  1.7 - 7.7 K/uL   Lymphocytes Relative 33  12 - 46 %   Lymphs Abs 3.8  0.7 - 4.0 K/uL   Monocytes Relative 14 (*) 3 - 12 %   Monocytes Absolute 1.7 (*) 0.1 - 1.0 K/uL   Eosinophils Relative 1  0 - 5 %   Eosinophils Absolute 0.1  0.0 - 0.7 K/uL   Basophils Relative 0  0 - 1 %   Basophils Absolute 0.0  0.0 - 0.1 K/uL  COMPREHENSIVE METABOLIC PANEL      Result Value Ref Range   Sodium 135 (*) 137 - 147 mEq/L   Potassium 3.9  3.7 - 5.3 mEq/L   Chloride 99  96 - 112 mEq/L   CO2 21  19 - 32 mEq/L   Glucose, Bld 249 (*) 70 - 99 mg/dL   BUN 16  6 - 23 mg/dL   Creatinine, Ser 0.69  0.50 - 1.10 mg/dL   Calcium 9.2  8.4 - 10.5 mg/dL   Total Protein 7.2  6.0 - 8.3 g/dL   Albumin 3.7  3.5 - 5.2 g/dL   AST 72 (*) 0 - 37 U/L   ALT 55 (*) 0 - 35 U/L   Alkaline Phosphatase 92  39 - 117 U/L   Total Bilirubin 0.2 (*) 0.3 - 1.2 mg/dL   GFR calc non Af Amer >90  >90 mL/min   GFR calc Af Amer >90  >90 mL/min   Imaging Review Ct Head Wo Contrast  05/09/2013   CLINICAL DATA:  Headache  EXAM: CT HEAD WITHOUT CONTRAST  TECHNIQUE: Contiguous axial images were obtained from  the base of the skull through the vertex without intravenous contrast.  COMPARISON:  Prior CT from 07/17/2012  FINDINGS: There is no acute intracranial hemorrhage or infarct. No mass lesion or midline shift. Gray-white matter differentiation is well maintained. Ventricles are normal in size without evidence of hydrocephalus. CSF containing spaces are within normal limits. No extra-axial fluid collection. Scattered and confluent hypodensity within the periventricular and deep white matter both cerebral hemispheres is compatible with mild chronic microvascular ischemic changes.  The calvarium is intact.  Orbital soft tissues are within normal limits.  The paranasal sinuses and right mastoid air cells are well pneumatized and free of fluid. Sequelae of prior canal wall up mastoidectomy noted on the left.  Scalp soft tissues are unremarkable.  IMPRESSION: 1. No acute  intracranial process identified. 2. Mild chronic microvascular ischemic disease, stable relative to prior study.   Electronically Signed   By: Jeannine Boga M.D.   On: 05/09/2013 01:21   Images viewed by me.  MDM   Final diagnoses:  Aphasia  Balance problem  Anemia  Hyponatremia    Episodes of loss of balance and A. aphasia of uncertain cause. It is noted that she is already on aspirin. Review of old records shows that she had a CT and MRI in 2009 which showed some lesions in the putamen which were felt to be possibly secondary to multiple sclerosis but also possibly just degenerative changes. The episodes have been coming on for 6 months and I do not see an indication for emergent hospitalization but she did should have neurology evaluation. She'll be sent for CT of the head tonight. She will probably need MRI although she states she is very claustrophobic and needs heavy sedation to have an MRI.  CT is unremarkable. She is referred to neurology for outpatient workup.  Delora Fuel, MD 12/08/09 [id number]

## 2013-05-25 ENCOUNTER — Encounter: Payer: Self-pay | Admitting: Neurology

## 2013-05-25 ENCOUNTER — Ambulatory Visit (INDEPENDENT_AMBULATORY_CARE_PROVIDER_SITE_OTHER): Payer: Medicare Other | Admitting: Neurology

## 2013-05-25 VITALS — BP 122/74 | HR 98 | Temp 98.1°F | Ht 64.0 in | Wt 196.0 lb

## 2013-05-25 DIAGNOSIS — F329 Major depressive disorder, single episode, unspecified: Secondary | ICD-10-CM

## 2013-05-25 DIAGNOSIS — R51 Headache: Secondary | ICD-10-CM

## 2013-05-25 DIAGNOSIS — F32A Depression, unspecified: Secondary | ICD-10-CM

## 2013-05-25 DIAGNOSIS — F3289 Other specified depressive episodes: Secondary | ICD-10-CM

## 2013-05-25 DIAGNOSIS — R404 Transient alteration of awareness: Secondary | ICD-10-CM | POA: Insufficient documentation

## 2013-05-25 NOTE — Progress Notes (Signed)
NEUROLOGY CONSULTATION NOTE  Carol Richmond MRN: [id number] DOB: 02/04/1951  Referring provider: Dr. Delora Fuel Primary care provider: Dr. Stoney Bang  Reason for consult:  Episodes of poor balance and speech difficulties  Dear Dr Roxanne Mins:  Thank you for your kind referral of Carol Richmond for consultation of the above symptomes. Although her history is well known to you, please allow me to reiterate it for the purpose of our medical record. The patient was accompanied to the clinic by her husband and daughter who also provide collateral information. Records from her previous neurologist Dr. Phillips Odor were personally reviewed.  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  Records from her neurologist from 2009 to 2013 were reviewed.  She was having episodes of abnormal smell like inhaling anesthesia, followed by breathing problems, numbness, disorientation, memory loss, difficulty finding words, and mild staggering gait. She was started on Keppra, then switched to Topamax.  A 48-hour EEG in 2010 captured 2 typical spells with no associated EEG changes.  Baseline EEG was normal.  She started seeing a psychologist and psychiatrist, Topamax was tapered and she was started on Lamictal for mood stabilization.  She was told by the psychologist that she has "absent seizures" and that "the left side of her brain is getting worse, and this could be affected by her sleep apnea."  She apparently had been seen by a neurologist at [facility] who felt the episodes of unresponsiveness could be due to sleep attacks.  She has a diagnosis of obstructive sleep apnea.  It appears the spells resolved in 2011.     Over the past 6 months, she has had episodes of feeling off balance with speech difficulties where speech is slurred and she is unable to find  appropriate words.  They were standing at [facility] then she started rocking, could not keep her balance, then felt her speech was going.  They were at a birthday party on 05/09/13 when it looked like she was waking up, then started "acting like a drunk person."  She has been having episodes of blanking out "like she is out in space," occurring 2-3 times a week.  She states these are different from the spells she had in 2009. Her husband reports that each time is worse than the other.  She has been having headaches on a near daily basis.  Headaches are over the vertex or bilateral temporal regions.  She hears a "boom" inside her head and wants to pull her hair out.  There is no associated nausea/vomiting/photo or phonophobia.  She asked if these episodes can be caused by stress, reporting that she has been having more episodes since she has been taking care of her sister the past 3 months.  She would "cry over nothing."  She has been taking Lexapro for many years and feels that "it does not seem to be working."  She takes Ativan 61m twice a day for anxiety.    She has a niece with seizures that passed away.  She had a normal birth and early development.  There is no history of febrile convulsions, CNS infections, significant traumatic brain injury, or neurosurgical procedures.  PAST MEDICAL HISTORY: Past Medical History  Diagnosis Date  . GERD (gastroesophageal reflux disease)   . Diarrhea   . Hypertension   . Hormone replacement therapy (postmenopausal)   . Migraine   .  Seizures   . IBS (irritable bowel syndrome)   . Functional ovarian cysts   . Diverticulosis of colon (without mention of hemorrhage)   . Hemorrhoids, external, without mention of complication   . Right knee DJD   . Shortness of breath   . Asthma   . Diabetes mellitus without complication   . Sleep apnea     no cpap    study 2 yrs ago    PAST SURGICAL HISTORY: Past Surgical History  Procedure Laterality Date  .  Cholecystectomy  1980  . Vaginal hysterectomy  1990  . Tonsillectomy    . Oophorectomy      2012 Dr. Adah Perl in [place]  . Tubal ligation    . Total knee arthroplasty Right 09/02/2012    Procedure: TOTAL KNEE ARTHROPLASTY- right;  Surgeon: Ninetta Lights, MD;  Location: [facility];  Service: Orthopedics;  Laterality: Right;    MEDICATIONS: Current Outpatient Prescriptions on File Prior to Visit  Medication Sig Dispense Refill  . albuterol (PROVENTIL HFA;VENTOLIN HFA) 108 (90 BASE) MCG/ACT inhaler Inhale 2 puffs into the lungs every 6 (six) hours as needed for wheezing or shortness of breath.      [name] aspirin EC 81 MG tablet Take 81 mg by mouth daily.      . cholecalciferol (VITAMIN D) 1000 UNITS tablet Take 1,000 Units by mouth daily.      [name] escitalopram (LEXAPRO) 20 MG tablet Take 20 mg by mouth daily with breakfast.       . glimepiride (AMARYL) 2 MG tablet Take 2 mg by mouth 2 (two) times daily.      [name] lamoTRIgine (LAMICTAL) 200 MG tablet Take 200 mg by mouth 2 (two) times daily.       [name] lisinopril-hydrochlorothiazide (PRINZIDE,ZESTORETIC) 20-12.5 MG per tablet Take 1 tablet by mouth daily.       [name] LORazepam (ATIVAN) 2 MG tablet Take 2 mg by mouth 2 (two) times daily.      [name] METFORMIN HCL PO Take 1,000 mg by mouth 2 (two) times daily before a meal.       . mometasone-formoterol (DULERA) 100-5 MCG/ACT AERO Inhale 2 puffs into the lungs 2 (two) times daily.      [name] omeprazole (PRILOSEC) 20 MG capsule Take 20 mg by mouth daily.      . simvastatin (ZOCOR) 20 MG tablet Take 20 mg by mouth every evening.      . valACYclovir (VALTREX) 500 MG tablet Take 500 mg by mouth daily.        No current facility-administered medications on file prior to visit.    ALLERGIES: No Known Allergies  FAMILY HISTORY: Family History  Problem Relation Age of Onset  . Heart disease Mother   . Diabetes Mother   . Heart disease Brother   . Cancer Sister   . Heart disease Brother   . Heart disease Brother      SOCIAL HISTORY: History   Social History  . Marital Status: Married    Spouse Name: N/A    Number of Children: N/A  . Years of Education: N/A   Occupational History  . Not on file.   Social History Main Topics  . Smoking status: Never Smoker   . Smokeless tobacco: Never Used  . Alcohol Use: No  . Drug Use: No  . Sexual Activity: Not on file   Other Topics Concern  . Not on file   Social History Narrative  . No narrative on file  REVIEW OF SYSTEMS: Constitutional: No fevers, chills, or sweats, no generalized fatigue, change in appetite Eyes: No visual changes, double vision, eye pain Ear, nose and throat: No hearing loss, ear pain, nasal congestion, sore throat Cardiovascular: No chest pain, palpitations Respiratory:  No shortness of breath at rest or with exertion, wheezes GastrointestinaI: No nausea, vomiting, diarrhea, abdominal pain, fecal incontinence Genitourinary:  No dysuria, urinary retention or frequency Musculoskeletal:  No neck pain, back pain Integumentary: No rash, pruritus, skin lesions Neurological: as above Psychiatric: No depression, insomnia, anxiety Endocrine: No palpitations, fatigue, diaphoresis, mood swings, change in appetite, change in weight, increased thirst Hematologic/Lymphatic:  No anemia, purpura, petechiae. Allergic/Immunologic: no itchy/runny eyes, nasal congestion, recent allergic reactions, rashes  PHYSICAL EXAM: Filed Vitals:   05/25/13 1406  BP: 122/74  Pulse: 98  Temp: 98.1 F (36.7 C)   General: No acute distress Head:  Normocephalic/atraumatic Neck: supple, no paraspinal tenderness, full range of motion Back: No paraspinal tenderness Heart: regular rate and rhythm Lungs: Clear to auscultation bilaterally. Vascular: No carotid bruits. Skin/Extremities: No rash, no edema Neurological Exam: Mental status: alert and oriented to person, place, and time, no dysarthria or aphasia, Fund of knowledge is appropriate.   Recent and remote memory are intact.  Attention and concentration are normal.    Able to name objects and repeat phrases. Cranial nerves: CN I: not tested CN II: pupils equal, round and reactive to light, visual fields intact, fundi unremarkable. CN III, IV, VI:  full range of motion, no nystagmus, no ptosis CN V: facial sensation intact CN VII: upper and lower face symmetric CN VIII: hearing intact CN IX, X: gag intact, uvula midline CN XI: sternocleidomastoid and trapezius muscles intact CN XII: tongue midline Bulk & Tone: normal, no fasciculations. Motor: 5/5 throughout with no pronator drift. Sensation: intact to light touch, cold, pin, vibration and joint position sense.  No extinction to double simultaneous stimulation.  Romberg test negative Deep Tendon Reflexes: +2 throughout except for absent bilateral ankle jerks, no ankle clonus Plantar responses: downgoing bilaterally Cerebellar: no incoordination on finger to nose, heel to shin. No dysdiadochokinesia Gait: narrow-based and steady, able to tandem walk adequately. Tremor: none  IMPRESSION: This is a 63 year old right-handed woman with a history of diabetes, hyperlipidemia, headaches, and non-epileptic episodes of decreased responsiveness, presenting with new onset episodes of feeling off balance with slurred speech.  She reports these are different from her "absent seizures" in 2009.  An MRI brain and 24-hour EEG will be ordered to help classify her symptoms in order to guide long-term management.  We discussed non-epileptic events and effects of stress, which she endorsed with taking care of her sister. She was encouraged to reconnect with her psychiatrist and psychologist. She feels Lexapro dose is not high enough, this may need to be adjusted by her PCP and she will discuss this with Dr. Sherrie Sport.  She will continue current medications for now.  She will follow-up in 3 months.  Thank you for allowing me to participate in the care  of this patient. Please do not hesitate to call for any questions or concerns.   Ellouise Newer, M.D.

## 2013-05-25 NOTE — Patient Instructions (Addendum)
1. Open MRI brain without contrast Thursday 9@ 1pm please arrive at 12:30pm  for check in 2. 24-hour EEG 3. Speak to PCP regarding depression medication 4. Reconnect with psychologist and psychiatrist 5. Follow-up in 3 months

## 2013-05-27 ENCOUNTER — Encounter: Payer: Self-pay | Admitting: Neurology

## 2013-06-09 ENCOUNTER — Other Ambulatory Visit: Payer: Self-pay | Admitting: *Deleted

## 2013-06-09 DIAGNOSIS — R404 Transient alteration of awareness: Secondary | ICD-10-CM

## 2013-06-18 ENCOUNTER — Encounter: Payer: Self-pay | Admitting: Neurology

## 2013-07-14 ENCOUNTER — Ambulatory Visit (INDEPENDENT_AMBULATORY_CARE_PROVIDER_SITE_OTHER): Payer: Medicare Other | Admitting: Neurology

## 2013-07-14 DIAGNOSIS — R404 Transient alteration of awareness: Secondary | ICD-10-CM

## 2013-07-16 ENCOUNTER — Telehealth: Payer: Self-pay | Admitting: Neurology

## 2013-07-16 NOTE — Telephone Encounter (Signed)
Opened in error

## 2013-07-21 NOTE — Progress Notes (Signed)
Patient came in for EEG. See Procedure notes for EEG results.

## 2013-07-21 NOTE — Procedures (Signed)
ELECTROENCEPHALOGRAM REPORT  Dates of Recording: 07/14/2013 to 07/15/2013  Patient's Name: Carol Richmond MRN: [id number] Date of Birth: [birth year]  Referring Provider: Dr. Ellouise Newer  Procedure: 24-hour-hour ambulatory EEG  History: This is a 63 year old with a history of non-epileptic episodes of decreased responsiveness, presenting with new onset episodes of feeling off balance with slurred speech. She reports these are different from her "absent seizures" in 2009  Medications: Lamotrigine, Lexapro, Amaryl, Ativan  Technical Summary: This is a 24-hour multichannel digital EEG recording measured by the international 10-20 system with electrodes applied with paste and impedances below 5000 ohms performed as portable with EKG monitoring.  The digital EEG was referentially recorded, reformatted, and digitally filtered in a variety of bipolar and referential montages for optimal display.    DESCRIPTION OF RECORDING: During maximal wakefulness, the background activity consisted of a symmetric 8.5 Hz posterior dominant rhythm which was reactive to eye opening.  This is admixed with a small amount of diffuse 6-7 Hz theta slowing of the waking background. There were no epileptiform discharges or focal slowing seen in wakefulness.  During the recording, the patient progresses through wakefulness, drowsiness, and Stage 2 sleep.  Again, there were no epileptiform discharges seen.  Events: Patient did not complete diary.  There were no push button events.  There were no electrographic seizures seen.  EKG lead was unremarkable.  IMPRESSION: This 24-hour ambulatory EEG study is mildly abnormal due to mild diffuse slowing of the waking background.  CLINICAL CORRELATION of the above findings indicates mild diffuse cerebral dysfunction that is non-specific in etiology and may be seen with toxic/metabolic encephalopathies, neurodegenerative conditions, or medication effect.  There were no  epileptiform discharges or electrographic seizures seen.  Typical events were not captured.     Ellouise Newer, M.D.

## 2013-08-24 ENCOUNTER — Ambulatory Visit: Payer: Medicare Other | Admitting: Neurology

## 2013-08-26 ENCOUNTER — Encounter: Payer: Self-pay | Admitting: Neurology

## 2013-08-26 ENCOUNTER — Ambulatory Visit (INDEPENDENT_AMBULATORY_CARE_PROVIDER_SITE_OTHER): Payer: Medicare Other | Admitting: Neurology

## 2013-08-26 VITALS — BP 128/78 | HR 84 | Resp 18 | Ht 64.0 in | Wt 198.0 lb

## 2013-08-26 DIAGNOSIS — F32A Depression, unspecified: Secondary | ICD-10-CM

## 2013-08-26 DIAGNOSIS — R51 Headache: Secondary | ICD-10-CM

## 2013-08-26 DIAGNOSIS — R404 Transient alteration of awareness: Secondary | ICD-10-CM

## 2013-08-26 DIAGNOSIS — F3289 Other specified depressive episodes: Secondary | ICD-10-CM

## 2013-08-26 DIAGNOSIS — F329 Major depressive disorder, single episode, unspecified: Secondary | ICD-10-CM

## 2013-08-26 MED ORDER — AMITRIPTYLINE HCL 10 MG PO TABS: ORAL_TABLET | ORAL | Status: DC

## 2013-08-26 NOTE — Progress Notes (Signed)
NEUROLOGY FOLLOW UP OFFICE NOTE  Carol Richmond [id number]  HISTORY OF PRESENT ILLNESS: I had the pleasure of seeing Carol Richmond in follow-up in the neurology clinic on 08/26/2013.  She is again accompanied by her husband and daughter today. The patient was last seen 3 months ago for episodes of blanking out, as well as episodes of feeling unsteady followed by slurred speech and word-finding difficulties.  In the past she had episodes of unresponsiveness, and episodes of abnormal smell like inhaling anesthesia, followed by breathing problems, numbness, disorientation, memory loss, difficulty finding words, and mild staggering gait. She was treated with anti-seizure medications for a time, until she was seen at [facility] and unresponsive episodes were felt to be due to sleep attacks.  These events resolved in 2011.  Due to new recurrent episodes of unsteadiness with slurred speech, brain imaging and EEG were done.  I personally reviewed MRI brain without contrast which did not show any acute changes, there was moderate chronic white matter microvascular disease, hippocampi symmetric with no abnormal signal seen.  Her 24-hour EEG was mildly abnormal due to mild diffuse slowing of the background, no epileptiform discharges or electrographic seizures seen.  She did not complete diary, but tells me today that she did have an episode where she felt she had a seizure and looked like she fell asleep.  On her last visit, she had endorsed significant stress with taking care of her sister.  Her sister has since moved out, and her daughter does note that she has seen an improvement since then, no further blanking out episodes.  She still feels tired and sleepy, and should be using a CPAP mask but cannot tolerate different types tried.  During the visit, she became tearful and asked for "stress medication," they report that she worries about everyone in their family.  On her initial visit, she was noted to be  taking Lamictal, however she cannot recall today if she is still taking this. She continues to have daily headaches with no associated nausea,vomiting, or visual changes.  She has been taking Tylenol 1-2 times a day.   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 Records from her neurologist from 2009 to 2013 were reviewed. She was having episodes of abnormal smell like inhaling anesthesia, followed by breathing problems, numbness, disorientation, memory loss, difficulty finding words, and mild staggering gait. She was started on Keppra, then switched to Topamax. A 48-hour EEG in 2010 captured 2 typical spells with no associated EEG changes. Baseline EEG was normal. She started seeing a psychologist and psychiatrist, Topamax was tapered and she was started on Lamictal for mood stabilization. She was told by the psychologist that she has "absent seizures" and that "the left side of her brain is getting worse, and this could be affected by her sleep apnea." She apparently had been seen by a neurologist at [facility] who felt the episodes of unresponsiveness could be due to sleep attacks. She has a diagnosis of obstructive sleep apnea. It appears the spells resolved in 2011.   Since November 2014 or so, she has had episodes of feeling off balance with speech difficulties where speech is slurred and she is unable to find appropriate words. They were standing at [facility] then she started rocking, could not keep her balance, then felt her speech was going. They were at a birthday party on 05/09/13 when it looked like she  was waking up, then started "acting like a drunk person." She has been having episodes of blanking out "like she is out in space," occurring 2-3 times a week. She states these are different from the spells she had in 2009. Her husband reports that each time is worse than the other. She has been having  headaches on a near daily basis. Headaches are over the vertex or bilateral temporal regions. She hears a "boom" inside her head and wants to pull her hair out. There is no associated nausea/vomiting/photo or phonophobia. She asked if these episodes can be caused by stress, reporting that she has been having more episodes since she has been taking care of her sister the past 3 months. She would "cry over nothing." She has been taking Lexapro for many years and feels that "it does not seem to be working." She takes Ativan 37m twice a day for anxiety.   PAST MEDICAL HISTORY: Past Medical History  Diagnosis Date  . GERD (gastroesophageal reflux disease)   . Diarrhea   . Hypertension   . Hormone replacement therapy (postmenopausal)   . Migraine   . Seizures   . IBS (irritable bowel syndrome)   . Functional ovarian cysts   . Diverticulosis of colon (without mention of hemorrhage)   . Hemorrhoids, external, without mention of complication   . Right knee DJD   . Shortness of breath   . Asthma   . Diabetes mellitus without complication   . Sleep apnea     no cpap    study 2 yrs ago    MEDICATIONS: Current Outpatient Prescriptions on File Prior to Visit  Medication Sig Dispense Refill  . albuterol (PROVENTIL HFA;VENTOLIN HFA) 108 (90 BASE) MCG/ACT inhaler Inhale 2 puffs into the lungs every 6 (six) hours as needed for wheezing or shortness of breath.      .Marland Kitchenaspirin EC 81 MG tablet Take 81 mg by mouth daily.      . cholecalciferol (VITAMIN D) 1000 UNITS tablet Take 1,000 Units by mouth daily.      .Marland Kitchenescitalopram (LEXAPRO) 20 MG tablet Take 20 mg by mouth daily with breakfast.       . glimepiride (AMARYL) 2 MG tablet Take 2 mg by mouth 2 (two) times daily.      .Marland KitchenlamoTRIgine (LAMICTAL) 200 MG tablet Take 200 mg by mouth 2 (two) times daily.       .Marland Kitchenlisinopril-hydrochlorothiazide (PRINZIDE,ZESTORETIC) 20-12.5 MG per tablet Take 1 tablet by mouth daily.       .Marland KitchenLORazepam (ATIVAN) 2 MG tablet Take  2 mg by mouth 2 (two) times daily.      .Marland KitchenMETFORMIN HCL PO Take 1,000 mg by mouth 2 (two) times daily before a meal.       . mometasone-formoterol (DULERA) 100-5 MCG/ACT AERO Inhale 2 puffs into the lungs 2 (two) times daily.      .Marland Kitchenomeprazole (PRILOSEC) 20 MG capsule Take 20 mg by mouth daily.      . simvastatin (ZOCOR) 20 MG tablet Take 20 mg by mouth every evening.      . valACYclovir (VALTREX) 500 MG tablet Take 500 mg by mouth daily.        No current facility-administered medications on file prior to visit.    ALLERGIES: No Known Allergies  FAMILY HISTORY: Family History  Problem Relation Age of Onset  . Heart disease Mother   . Diabetes Mother   . Heart disease Brother   . Cancer  Sister   . Heart disease Brother   . Heart disease Brother     SOCIAL HISTORY: History   Social History  . Marital Status: Married    Spouse Name: N/A    Number of Children: N/A  . Years of Education: N/A   Occupational History  . Not on file.   Social History Main Topics  . Smoking status: Never Smoker   . Smokeless tobacco: Never Used  . Alcohol Use: No  . Drug Use: No  . Sexual Activity: Not on file   Other Topics Concern  . Not on file   Social History Narrative  . No narrative on file    REVIEW OF SYSTEMS: Constitutional: No fevers, chills, or sweats, no generalized fatigue, change in appetite Eyes: No visual changes, double vision, eye pain Ear, nose and throat: No hearing loss, ear pain, nasal congestion, sore throat Cardiovascular: No chest pain, palpitations Respiratory:  No shortness of breath at rest or with exertion, wheezes GastrointestinaI: No nausea, vomiting, diarrhea, abdominal pain, fecal incontinence Genitourinary:  No dysuria, urinary retention or frequency Musculoskeletal:  No neck pain, back pain Integumentary: No rash, pruritus, skin lesions Neurological: as above Psychiatric: + depression, insomnia, anxiety Endocrine: No palpitations, fatigue,  diaphoresis, mood swings, change in appetite, change in weight, increased thirst Hematologic/Lymphatic:  No anemia, purpura, petechiae. Allergic/Immunologic: no itchy/runny eyes, nasal congestion, recent allergic reactions, rashes  PHYSICAL EXAM: Filed Vitals:   08/26/13 0741  BP: 128/78  Pulse: 84  Resp: 18   General: No acute distress, became tearful several times during the visit Head:  Normocephalic/atraumatic Neck: supple, no paraspinal tenderness, full range of motion Heart:  Regular rate and rhythm Lungs:  Clear to auscultation bilaterally Back: No paraspinal tenderness Skin/Extremities: No rash, no edema Neurological Exam: alert and oriented to person, place, and time. No aphasia or dysarthria. Fund of knowledge is appropriate.  Recent and remote memory are intact.  Attention and concentration are normal.    Able to name objects and repeat phrases. Cranial nerves: Pupils equal, round, reactive to light.  Fundoscopic exam unremarkable, no papilledema. Extraocular movements intact with no nystagmus. Visual fields full. Facial sensation intact. No facial asymmetry. Tongue, uvula, palate midline.  Motor: Bulk and tone normal, muscle strength 5/5 throughout with no pronator drift.  Sensation to light touch intact.  No extinction to double simultaneous stimulation.  Deep tendon reflexes 2+ throughout except for absent ankle jerks bilaterally, toes downgoing.  Finger to nose testing intact.  Gait narrow-based and steady, able to tandem walk adequately.  Romberg negative.  IMPRESSION:  This is a 63 yo RH woman with a history of diabetes, hyperlipidemia, headaches, and non-epileptic episodes of decreased responsiveness, presenting with new onset episodes of feeling off balance with slurred speech. She reports these are different from her "absent seizures" in 2009. MRI brain and 24-hour EEG unremarkable.  The etiology of these symptoms appear related to stress, her daughter reports improvement now  that she is not taking care of her sister anymore. She continues to endorse stress worrying about her family and became tearful several times in the office.  She continues to report headaches, likely tension-type headaches with component of medication overuse headaches.  She will start low dose amitriptyline for headache prophylaxis, and to hopefully help with mood and sleep as well.  Side effects were discussed.  This will be uptitrated slowly as tolerated.  She was again encouraged to reconnect with her psychiatrist and psychologist. She will minimize intake of rescue  over the counter pain medications to 2-3 a week to avoid rebound headaches.  She will keep a headache diary and follow-up in 3 months.  Thank you for allowing me to participate in her care.  Please do not hesitate to call for any questions or concerns.  The duration of this appointment visit was 25 minutes of face-to-face time with the patient.  Greater than 50% of this time was spent in counseling, explanation of diagnosis, planning of further management, and coordination of care.   Ellouise Newer, M.D.   CC: Dr. Sherrie Sport

## 2013-08-26 NOTE — Patient Instructions (Signed)
1. Start amitriptyline 12m: Take 1 tab at bedtime for 1 week, then increase to 2 tabs at bedtime 2. Minimize use of Tylenol to 2-3 tabs a week only to avoid rebound headaches 3. Please check your medicine bottles at home if you are taking Lamotrigine/Lamictal 4. Reconnect with your psychiatrist and therapist 5. Keep a headache calendar

## 2013-09-23 ENCOUNTER — Ambulatory Visit (INDEPENDENT_AMBULATORY_CARE_PROVIDER_SITE_OTHER): Payer: No Typology Code available for payment source | Admitting: Psychology

## 2013-09-23 DIAGNOSIS — G40909 Epilepsy, unspecified, not intractable, without status epilepticus: Secondary | ICD-10-CM

## 2013-09-23 DIAGNOSIS — F331 Major depressive disorder, recurrent, moderate: Secondary | ICD-10-CM

## 2013-09-30 ENCOUNTER — Encounter: Payer: Self-pay | Admitting: Cardiovascular Disease

## 2013-09-30 ENCOUNTER — Telehealth: Payer: Self-pay | Admitting: Cardiovascular Disease

## 2013-09-30 ENCOUNTER — Encounter: Payer: Self-pay | Admitting: *Deleted

## 2013-09-30 ENCOUNTER — Ambulatory Visit (INDEPENDENT_AMBULATORY_CARE_PROVIDER_SITE_OTHER): Payer: Medicare Other | Admitting: Cardiovascular Disease

## 2013-09-30 VITALS — BP 112/70 | HR 99 | Ht 64.0 in | Wt 194.0 lb

## 2013-09-30 DIAGNOSIS — R0602 Shortness of breath: Secondary | ICD-10-CM

## 2013-09-30 DIAGNOSIS — R000 Tachycardia, unspecified: Secondary | ICD-10-CM

## 2013-09-30 DIAGNOSIS — E785 Hyperlipidemia, unspecified: Secondary | ICD-10-CM

## 2013-09-30 DIAGNOSIS — I10 Essential (primary) hypertension: Secondary | ICD-10-CM

## 2013-09-30 DIAGNOSIS — R079 Chest pain, unspecified: Secondary | ICD-10-CM

## 2013-09-30 MED ORDER — RANOLAZINE ER 500 MG PO TB12: 500.0000 mg | ORAL_TABLET | Freq: Two times a day (BID) | ORAL | Status: DC

## 2013-09-30 MED ORDER — METOPROLOL SUCCINATE ER 25 MG PO TB24: 25.0000 mg | ORAL_TABLET | Freq: Every day | ORAL | Status: DC

## 2013-09-30 NOTE — Patient Instructions (Addendum)
   Start Toprol XL 70m twice a day   Ranexa 5031mtwice a day samples given. Continue all other medications.   Your physician has requested that you have a lexiscan myoview. For further information please visit [email or web address] follow instruction sheet, as given. Your physician has requested that you have an echocardiogram. Echocardiography is a painless test that uses sound waves to create images of your heart. It provides your doctor with information about the size and shape of your heart and how well your heart's chambers and valves are working. This procedure takes approximately one hour. There are no restrictions for this procedure. Follow up pending results

## 2013-09-30 NOTE — Progress Notes (Signed)
Patient ID: TERICKA DEVINCENZI, female   DOB: 09-01-1950, 63 y.o.   MRN: [id number]       CARDIOLOGY CONSULT NOTE  Patient ID: RYLYNN KOBS MRN: [id number] DOB/AGE: Jul 31, 1950 63 y.o.  Admit date: (Not on file) Primary Physician Neale Burly, MD  Reason for Consultation: chest pain  HPI: The patient is a 63 year old woman with a past medical history significant for essential hypertension, hyperlipidemia, diabetes mellitus, non-epileptic unresponsive episodes and anxiety with depression. She has been experiencing a dull retrosternal chest pain for the past 6 months which routinely occurs with exertion including activities such as sweeping, doing the dishes, and showering. Her activities of daily living have been remarkably limited due to this. She has associated shortness of breath, dizziness and lightheadedness but denies leg swelling and syncope. She's had problems with her balance. She has chronic posterior headaches. She is tearful and says "I hope somebody will find something".  ECG performed in the office today demonstrates possibly atrial flutter vs sinus tachycardia, heart rate 103 beats per minute.  No Known Allergies  Current Outpatient Prescriptions  Medication Sig Dispense Refill  . albuterol (PROVENTIL HFA;VENTOLIN HFA) 108 (90 BASE) MCG/ACT inhaler Inhale 2 puffs into the lungs every 6 (six) hours as needed for wheezing or shortness of breath.      [name] amitriptyline (ELAVIL) 10 MG tablet Take 1 tab at bedtime for 1 week, then 2 tabs at bedtime  60 tablet  3  . aspirin EC 81 MG tablet Take 81 mg by mouth daily.      . cholecalciferol (VITAMIN D) 1000 UNITS tablet Take 1,000 Units by mouth daily.      [name] escitalopram (LEXAPRO) 20 MG tablet Take 20 mg by mouth daily with breakfast.       . glimepiride (AMARYL) 2 MG tablet Take 2 mg by mouth 2 (two) times daily.      [name] lamoTRIgine (LAMICTAL) 200 MG tablet Take 200 mg by mouth 2 (two) times daily.       [name]  lisinopril-hydrochlorothiazide (PRINZIDE,ZESTORETIC) 20-12.5 MG per tablet Take 1 tablet by mouth daily.       [name] LORazepam (ATIVAN) 2 MG tablet Take 2 mg by mouth 2 (two) times daily.      [name] METFORMIN HCL PO Take 1,000 mg by mouth 2 (two) times daily before a meal.       . mometasone-formoterol (DULERA) 100-5 MCG/ACT AERO Inhale 2 puffs into the lungs 2 (two) times daily.      [name] omeprazole (PRILOSEC) 20 MG capsule Take 20 mg by mouth daily.      . simvastatin (ZOCOR) 20 MG tablet Take 20 mg by mouth every evening.      . valACYclovir (VALTREX) 500 MG tablet Take 500 mg by mouth daily.        No current facility-administered medications for this visit.    Past Medical History  Diagnosis Date  . GERD (gastroesophageal reflux disease)   . Diarrhea   . Hypertension   . Hormone replacement therapy (postmenopausal)   . Migraine   . Seizures   . IBS (irritable bowel syndrome)   . Functional ovarian cysts   . Diverticulosis of colon (without mention of hemorrhage)   . Hemorrhoids, external, without mention of complication   . Right knee DJD   . Shortness of breath   . Asthma   . Diabetes mellitus without complication   . Sleep apnea     no cpap    study 2  yrs ago    Past Surgical History  Procedure Laterality Date  . Cholecystectomy  1980  . Vaginal hysterectomy  1990  . Tonsillectomy    . Oophorectomy      2012 Dr. Adah Perl in [place]  . Tubal ligation    . Total knee arthroplasty Right 09/02/2012    Procedure: TOTAL KNEE ARTHROPLASTY- right;  Surgeon: Ninetta Lights, MD;  Location: [facility];  Service: Orthopedics;  Laterality: Right;    History   Social History  . Marital Status: Married    Spouse Name: N/A    Number of Children: N/A  . Years of Education: N/A   Occupational History  . Not on file.   Social History Main Topics  . Smoking status: Never Smoker   . Smokeless tobacco: Never Used  . Alcohol Use: No  . Drug Use: No  . Sexual Activity: Not on file   Other  Topics Concern  . Not on file   Social History Narrative  . No narrative on file     No family history of premature CAD in 1st degree relatives.  Prior to Admission medications   Medication Sig Start Date End Date Taking? Authorizing Provider  albuterol (PROVENTIL HFA;VENTOLIN HFA) 108 (90 BASE) MCG/ACT inhaler Inhale 2 puffs into the lungs every 6 (six) hours as needed for wheezing or shortness of breath.    Historical Provider, MD  amitriptyline (ELAVIL) 10 MG tablet Take 1 tab at bedtime for 1 week, then 2 tabs at bedtime 08/26/13   Cameron Sprang, MD  aspirin EC 81 MG tablet Take 81 mg by mouth daily.    Historical Provider, MD  cholecalciferol (VITAMIN D) 1000 UNITS tablet Take 1,000 Units by mouth daily.    Historical Provider, MD  escitalopram (LEXAPRO) 20 MG tablet Take 20 mg by mouth daily with breakfast.     Historical Provider, MD  glimepiride (AMARYL) 2 MG tablet Take 2 mg by mouth 2 (two) times daily.    Historical Provider, MD  lamoTRIgine (LAMICTAL) 200 MG tablet Take 200 mg by mouth 2 (two) times daily.     Historical Provider, MD  lisinopril-hydrochlorothiazide (PRINZIDE,ZESTORETIC) 20-12.5 MG per tablet Take 1 tablet by mouth daily.     Historical Provider, MD  LORazepam (ATIVAN) 2 MG tablet Take 2 mg by mouth 2 (two) times daily.    Historical Provider, MD  METFORMIN HCL PO Take 1,000 mg by mouth 2 (two) times daily before a meal.     Historical Provider, MD  mometasone-formoterol (DULERA) 100-5 MCG/ACT AERO Inhale 2 puffs into the lungs 2 (two) times daily.    Historical Provider, MD  omeprazole (PRILOSEC) 20 MG capsule Take 20 mg by mouth daily.    Historical Provider, MD  simvastatin (ZOCOR) 20 MG tablet Take 20 mg by mouth every evening.    Historical Provider, MD  valACYclovir (VALTREX) 500 MG tablet Take 500 mg by mouth daily.     Historical Provider, MD     Review of systems complete and found to be negative unless listed above in HPI     Physical  exam Height 5' 4"  (1.626 m), weight 194 lb (87.998 kg).  BP 112/70  Pulse 99   General: NAD, tearful Neck: No JVD, no thyromegaly or thyroid nodule.  Lungs: Clear to auscultation bilaterally with normal respiratory effort. CV: Nondisplaced PMI. High normal rate with regular rhythm, normal S1/S2, no S3/S4, no murmur.  No peripheral edema.  No carotid bruit.  Normal pedal  pulses.  Abdomen: Soft, nontender, no hepatosplenomegaly, no distention.  Skin: Intact without lesions or rashes.  Neurologic: Alert and oriented x 3.  Psych: Normal affect. Extremities: No clubbing or cyanosis.  HEENT: Normal.   Labs:   Lab Results  Component Value Date   WBC 11.8* 05/08/2013   HGB 11.5* 05/08/2013   HCT 33.9* 05/08/2013   MCV 85.8 05/08/2013   PLT 203 05/08/2013   No results found for this basename: NA, K, CL, CO2, BUN, CREATININE, CALCIUM, LABALBU, PROT, BILITOT, ALKPHOS, ALT, AST, GLUCOSE,  in the last 168 hours Lab Results  Component Value Date   CKTOTAL 32 05/08/2007   CKMB 0.6 05/08/2007   TROPONINI  Value: 0.02        NO INDICATION OF MYOCARDIAL INJURY. 05/08/2007    Lab Results  Component Value Date   CHOL  Value: 196        ATP III CLASSIFICATION:  <200     mg/dL   Desirable  200-239  mg/dL   Borderline High  >=240    mg/dL   High 05/08/2007   Lab Results  Component Value Date   HDL 38* 05/08/2007   Lab Results  Component Value Date   LDLCALC  Value: 120        Total Cholesterol/HDL:CHD Risk Coronary Heart Disease Risk Table                     Men   Women  1/2 Average Risk   3.4   3.3* 05/08/2007   Lab Results  Component Value Date   TRIG 192* 05/08/2007   Lab Results  Component Value Date   CHOLHDL 5.2 05/08/2007   No results found for this basename: LDLDIRECT         Studies: No results found.  ASSESSMENT AND PLAN:  1. Possible atrial flutter vs sinus tachycardia: I will start Toprol-XL 25 mg daily. I will obtain a stress test for her chest pain and assess heart rhythm at  that time. I will also obtain an echocardiogram. If she does have definitive atrial flutter, she would need anticoagulation.  2. Essential HTN: Well controlled on current therapy which includes Prinzide 20-12.5 mg daily.  3. Hyperlipidemia: Lipids on 08/23/2013 demonstrated total cholesterol 137, triglycerides 102, HDL 56, LDL 61. Continue current therapy with simvastatin 20 mg daily.  4. Chest pain: Her ADL's have been significantly limited by her symptoms, which occur routinely with exertion. She has multiple cardiovascular risk factors including diabetes, HTN, and hyperlipidemia. I will obtain a Lexiscan Cardiolite (had knee replacement recently and unable to walk adequately) as well as an echocardiogram. I will start Toprol-XL 25 mg daily and provide samples of Ranexa 500 mg bid (unable to use nitrates due to her chronic headaches).  Dispo: f/u 3 weeks.   Signed: Kate Sable, M.D., F.A.C.C.  09/30/2013, 11:49 AM

## 2013-09-30 NOTE — Telephone Encounter (Signed)
BLUE MCR LTHF#[id number] EXP 10-29-13/CH

## 2013-09-30 NOTE — Telephone Encounter (Signed)
Lexiscan holding toprol xl dx chest pain - scheduled at [facility] on aug 19 arrive at 9:15  Echo

## 2013-10-05 ENCOUNTER — Other Ambulatory Visit (HOSPITAL_COMMUNITY): Payer: Self-pay | Admitting: *Deleted

## 2013-10-05 DIAGNOSIS — R079 Chest pain, unspecified: Secondary | ICD-10-CM

## 2013-10-06 ENCOUNTER — Encounter (HOSPITAL_COMMUNITY)
Admission: RE | Admit: 2013-10-06 | Discharge: 2013-10-06 | Disposition: A | Discharge status: Home / Self Care | Payer: Medicare Other | Source: Ambulatory Visit | Attending: Cardiovascular Disease | Admitting: Cardiovascular Disease

## 2013-10-06 ENCOUNTER — Encounter (HOSPITAL_COMMUNITY): Payer: Self-pay

## 2013-10-06 ENCOUNTER — Ambulatory Visit (HOSPITAL_COMMUNITY)
Admission: RE | Admit: 2013-10-06 | Discharge: 2013-10-06 | Disposition: A | Discharge status: Home / Self Care | Payer: Medicare Other | Source: Ambulatory Visit | Attending: Cardiovascular Disease | Admitting: Cardiovascular Disease

## 2013-10-06 DIAGNOSIS — E119 Type 2 diabetes mellitus without complications: Secondary | ICD-10-CM | POA: Insufficient documentation

## 2013-10-06 DIAGNOSIS — I10 Essential (primary) hypertension: Secondary | ICD-10-CM | POA: Insufficient documentation

## 2013-10-06 DIAGNOSIS — I4891 Unspecified atrial fibrillation: Secondary | ICD-10-CM | POA: Diagnosis not present

## 2013-10-06 DIAGNOSIS — R079 Chest pain, unspecified: Secondary | ICD-10-CM | POA: Insufficient documentation

## 2013-10-06 DIAGNOSIS — E785 Hyperlipidemia, unspecified: Secondary | ICD-10-CM | POA: Diagnosis not present

## 2013-10-06 MED ORDER — SODIUM CHLORIDE 0.9 % IJ SOLN
INTRAMUSCULAR | Status: AC
  Administered 2013-10-06: 10 mL via INTRAVENOUS
  Filled 2013-10-06: qty 10

## 2013-10-06 MED ORDER — TECHNETIUM TC 99M SESTAMIBI - CARDIOLITE
30.0000 | Freq: Once | INTRAVENOUS | Status: AC | PRN
  Administered 2013-10-06: 10:00:00 30 via INTRAVENOUS

## 2013-10-06 MED ORDER — SODIUM CHLORIDE 0.9 % IJ SOLN
10.0000 mL | INTRAMUSCULAR | Status: DC | PRN
Start: 2013-10-06 — End: 2013-10-07
  Administered 2013-10-06: 10 mL via INTRAVENOUS

## 2013-10-06 MED ORDER — TECHNETIUM TC 99M SESTAMIBI GENERIC - CARDIOLITE
10.0000 | Freq: Once | INTRAVENOUS | Status: AC | PRN
  Administered 2013-10-06: 10 via INTRAVENOUS

## 2013-10-06 MED ORDER — REGADENOSON 0.4 MG/5ML IV SOLN
INTRAVENOUS | Status: AC
  Administered 2013-10-06: 0.4 mg via INTRAVENOUS
  Filled 2013-10-06: qty 5

## 2013-10-06 MED ORDER — REGADENOSON 0.4 MG/5ML IV SOLN
0.4000 mg | Freq: Once | INTRAVENOUS | Status: AC
  Administered 2013-10-06: 0.4 mg via INTRAVENOUS

## 2013-10-06 NOTE — Progress Notes (Signed)
Stress Lab Nurses Notes - [facility]  SHANTEE HAYNE 10/06/2013 Reason for doing test: Chest Pain Type of test: Lexiscan/Cardiolite Nurse performing test: Boneta Lucks RN Nuclear Medicine Tech: Melburn Hake Echo Tech: Not Applicable MD performing test: Nena Polio Family MD: X. Hasanaj  Test explained and consent signed: Yes.   IV started: Saline lock started in radiology Symptoms: Pressure in abdomen and throat Treatment/Intervention: None Reason test stopped: protocol completed After recovery IV was: Discontinued via X-ray tech Patient to return to [place]. Med at : Patient discharged: Home Patient's Condition upon discharge was: stable Comments:  During procedure BP 97/59 HR 99, Recovery BP 99/62 HR 93, symptoms resolved Jannat Rosemeyer, Wells Guiles

## 2013-10-12 ENCOUNTER — Telehealth: Payer: Self-pay | Admitting: *Deleted

## 2013-10-12 NOTE — Telephone Encounter (Signed)
Message copied by Orion Modest on Tue Oct 12, 2013 11:38 AM ------      Message from: Boscobel F      Created: Mon Oct 11, 2013  6:57 PM       Negative stress test. Keep follow up with Dr Bronson Ing ------

## 2013-10-12 NOTE — Telephone Encounter (Signed)
Pt informed of results.

## 2013-10-15 ENCOUNTER — Ambulatory Visit (INDEPENDENT_AMBULATORY_CARE_PROVIDER_SITE_OTHER): Payer: No Typology Code available for payment source | Admitting: Psychology

## 2013-10-15 DIAGNOSIS — F331 Major depressive disorder, recurrent, moderate: Secondary | ICD-10-CM

## 2013-10-15 DIAGNOSIS — G40909 Epilepsy, unspecified, not intractable, without status epilepticus: Secondary | ICD-10-CM

## 2013-10-20 ENCOUNTER — Other Ambulatory Visit: Payer: Self-pay

## 2013-10-20 ENCOUNTER — Other Ambulatory Visit (INDEPENDENT_AMBULATORY_CARE_PROVIDER_SITE_OTHER): Payer: Medicare Other

## 2013-10-20 DIAGNOSIS — R0602 Shortness of breath: Secondary | ICD-10-CM

## 2013-10-20 DIAGNOSIS — R079 Chest pain, unspecified: Secondary | ICD-10-CM

## 2013-10-20 DIAGNOSIS — I059 Rheumatic mitral valve disease, unspecified: Secondary | ICD-10-CM

## 2013-10-21 ENCOUNTER — Telehealth: Payer: Self-pay | Admitting: *Deleted

## 2013-10-21 NOTE — Telephone Encounter (Signed)
Message copied by Laurine Blazer on Thu Oct 21, 2013 11:00 AM ------      Message from: Kate Sable A      Created: Wed Oct 20, 2013  4:20 PM       Normal pumping function. ------

## 2013-10-21 NOTE — Telephone Encounter (Signed)
Notes Recorded by Laurine Blazer, LPN on 06/20/9120 at 58:34 AM Patient notified. Follow up OV scheduled for 11/16/2013 with Dr. Bronson Ing.

## 2013-11-16 ENCOUNTER — Other Ambulatory Visit: Payer: Self-pay | Admitting: Cardiovascular Disease

## 2013-11-16 ENCOUNTER — Encounter: Payer: Self-pay | Admitting: Cardiovascular Disease

## 2013-11-16 ENCOUNTER — Telehealth: Payer: Self-pay | Admitting: Cardiovascular Disease

## 2013-11-16 ENCOUNTER — Encounter: Payer: Self-pay | Admitting: *Deleted

## 2013-11-16 ENCOUNTER — Encounter (HOSPITAL_COMMUNITY): Payer: Self-pay | Admitting: Pharmacy Technician

## 2013-11-16 ENCOUNTER — Ambulatory Visit (INDEPENDENT_AMBULATORY_CARE_PROVIDER_SITE_OTHER): Payer: Medicare Other | Admitting: Cardiovascular Disease

## 2013-11-16 VITALS — BP 100/67 | HR 85 | Ht 64.0 in | Wt 195.0 lb

## 2013-11-16 DIAGNOSIS — F3289 Other specified depressive episodes: Secondary | ICD-10-CM

## 2013-11-16 DIAGNOSIS — I10 Essential (primary) hypertension: Secondary | ICD-10-CM

## 2013-11-16 DIAGNOSIS — I209 Angina pectoris, unspecified: Secondary | ICD-10-CM

## 2013-11-16 DIAGNOSIS — F32A Depression, unspecified: Secondary | ICD-10-CM

## 2013-11-16 DIAGNOSIS — R000 Tachycardia, unspecified: Secondary | ICD-10-CM

## 2013-11-16 DIAGNOSIS — F329 Major depressive disorder, single episode, unspecified: Secondary | ICD-10-CM

## 2013-11-16 DIAGNOSIS — R079 Chest pain, unspecified: Secondary | ICD-10-CM

## 2013-11-16 DIAGNOSIS — E119 Type 2 diabetes mellitus without complications: Secondary | ICD-10-CM

## 2013-11-16 DIAGNOSIS — K449 Diaphragmatic hernia without obstruction or gangrene: Secondary | ICD-10-CM

## 2013-11-16 DIAGNOSIS — E785 Hyperlipidemia, unspecified: Secondary | ICD-10-CM

## 2013-11-16 DIAGNOSIS — K219 Gastro-esophageal reflux disease without esophagitis: Secondary | ICD-10-CM

## 2013-11-16 DIAGNOSIS — R0602 Shortness of breath: Secondary | ICD-10-CM

## 2013-11-16 NOTE — Patient Instructions (Signed)
Your physician has requested that you have a cardiac catheterization. Cardiac catheterization is used to diagnose and/or treat various heart conditions. Doctors may recommend this procedure for a number of different reasons. The most common reason is to evaluate chest pain. Chest pain can be a symptom of coronary artery disease (CAD), and cardiac catheterization can show whether plaque is narrowing or blocking your heart's arteries. This procedure is also used to evaluate the valves, as well as measure the blood flow and oxygen levels in different parts of your heart. For further information please visit [email or web address]. Please follow instruction sheet, as given. Follow up will be given at time of discharge

## 2013-11-16 NOTE — Telephone Encounter (Signed)
No precert required 

## 2013-11-16 NOTE — Progress Notes (Signed)
[name], female   DOB: December 19, 1950, 63 y.o.   MRN: [id number]      SUBJECTIVE: The patient returns for followup on cardiovascular testing performed for the evaluation of chest pain. Echocardiogram demonstrated mild LVH with LVEF 47-82%, grade 1 diastolic dysfunction, with a mildly thickened mitral valve with mild mitral regurgitation and normal PASP. Nuclear stress testing did not demonstrate any evidence of myocardial ischemia or scar with normal regional wall motion and left ventricular systolic function. ECG tracings demonstrated normal sinus rhythm with nonspecific ST segment abnormalities. It was deemed a low risk study. In summary, she is a 64 year old woman with a past medical history significant for essential hypertension, hyperlipidemia, diabetes mellitus, non-epileptic unresponsive episodes and anxiety with depression.  She has been experiencing a dull retrosternal chest pain for the past 6 months which routinely occurs with exertion including activities such as sweeping, doing the dishes, and showering. Her activities of daily living have been remarkably limited due to this. She has associated shortness of breath, dizziness and lightheadedness but denies leg swelling and syncope. She's had problems with her balance.   In spite of the addition of both metoprolol and Ranexa, she has had no symptom relief. She also provides a history of a hiatal hernia and GERD and does have dysphagia for solids with food occasionally getting stuck in her throat associated with choking. However, these symptoms appear to be different from her exertional chest pain accompanied by shortness of breath.    Review of Systems: As per "subjective", otherwise negative.  No Known Allergies  Current Outpatient Prescriptions  Medication Sig Dispense Refill  . albuterol (PROVENTIL HFA;VENTOLIN HFA) 108 (90 BASE) MCG/ACT inhaler Inhale 2 puffs into the lungs every 6 (six) hours as needed for  wheezing or shortness of breath.      [name] amitriptyline (ELAVIL) 10 MG tablet Take 20 mg by mouth at bedtime.      [name] aspirin EC 81 MG tablet Take 81 mg by mouth daily.      . cholecalciferol (VITAMIN D) 1000 UNITS tablet Take 1,000 Units by mouth daily.      [name] escitalopram (LEXAPRO) 20 MG tablet Take 20 mg by mouth daily with breakfast.       . insulin aspart (NOVOLOG FLEXPEN) 100 UNIT/ML FlexPen Inject into the skin 3 (three) times daily with meals.      . lamoTRIgine (LAMICTAL) 200 MG tablet Take 200 mg by mouth 2 (two) times daily.       [name] lisinopril-hydrochlorothiazide (PRINZIDE,ZESTORETIC) 20-12.5 MG per tablet Take 1 tablet by mouth daily.       [name] LORazepam (ATIVAN) 2 MG tablet Take 2 mg by mouth 2 (two) times daily.      [name] METFORMIN HCL PO Take 1,000 mg by mouth 2 (two) times daily before a meal.       . metoprolol succinate (TOPROL XL) 25 MG 24 hr tablet Take 1 tablet (25 mg total) by mouth daily.  30 tablet  6  . omeprazole (PRILOSEC) 20 MG capsule Take 20 mg by mouth daily.      . ranolazine (RANEXA) 500 MG 12 hr tablet Take 1 tablet (500 mg total) by mouth 2 (two) times daily.  60 tablet  2  . ranolazine (RANEXA) 500 MG 12 hr tablet Take 1 tablet (500 mg total) by mouth 2 (two) times daily.  14 tablet  0  . simvastatin (ZOCOR) 20 MG tablet Take 20 mg by mouth every evening.      [name]  valACYclovir (VALTREX) 500 MG tablet Take 500 mg by mouth daily.        No current facility-administered medications for this visit.    Past Medical History  Diagnosis Date  . GERD (gastroesophageal reflux disease)   . Diarrhea   . Hypertension   . Hormone replacement therapy (postmenopausal)   . Migraine   . Seizures   . IBS (irritable bowel syndrome)   . Functional ovarian cysts   . Diverticulosis of colon (without mention of hemorrhage)   . Hemorrhoids, external, without mention of complication   . Right knee DJD   . Shortness of breath   . Asthma   . Diabetes mellitus without complication     . Sleep apnea     no cpap    study 2 yrs ago    Past Surgical History  Procedure Laterality Date  . Cholecystectomy  1980  . Vaginal hysterectomy  1990  . Tonsillectomy    . Oophorectomy      2012 Dr. Adah Perl in [place]  . Tubal ligation    . Total knee arthroplasty Right 09/02/2012    Procedure: TOTAL KNEE ARTHROPLASTY- right;  Surgeon: Ninetta Lights, MD;  Location: [facility];  Service: Orthopedics;  Laterality: Right;    History   Social History  . Marital Status: Married    Spouse Name: N/A    Number of Children: N/A  . Years of Education: N/A   Occupational History  . Not on file.   Social History Main Topics  . Smoking status: Never Smoker   . Smokeless tobacco: Never Used  . Alcohol Use: No  . Drug Use: No  . Sexual Activity: Not on file   Other Topics Concern  . Not on file   Social History Narrative  . No narrative on file     Filed Vitals:   11/16/13 1002  BP: 100/67  Pulse: 85  Height: 5' 4"  (1.626 m)  Weight: 195 lb (88.451 kg)    PHYSICAL EXAM General: NAD HEENT: Normal. Neck: No JVD, no thyromegaly. Lungs: Clear to auscultation bilaterally with normal respiratory effort. CV: Nondisplaced PMI.  Regular rate and rhythm, normal S1/S2, no S3/S4, no murmur. No pretibial or periankle edema.  No carotid bruit.  Normal pedal pulses.  Abdomen: Soft, nontender, no hepatosplenomegaly, no distention.  Neurologic: Alert and oriented x 3.  Psych: Normal affect. Skin: Normal. Musculoskeletal: Normal range of motion, no gross deformities. Extremities: No clubbing or cyanosis.   ECG: Most recent ECG reviewed.      ASSESSMENT AND PLAN: 1. Sinus tachycardia: I previously started Toprol-XL 25 mg daily.  2. Essential HTN: Well controlled on current therapy which includes Prinzide 20-12.5 mg daily.  3. Hyperlipidemia: Lipids on 08/23/2013 demonstrated total cholesterol 137, triglycerides 102, HDL 56, LDL 61. Continue current therapy with simvastatin 20 mg  daily.  4. Chest pain: Her ADL's have been significantly limited by her symptoms, which occur routinely with exertion. She has multiple cardiovascular risk factors including diabetes, HTN, and hyperlipidemia.      In spite of normal nuclear myocardial perfusion, I will proceed with coronary angiography. If this were found to be normal, I would pursue a GI workup with respect to her history of a hiatal hernia and GERD. 5. GERD/hiatal hernia: Will pursue workup after coronary angiography. 6. Depression: On Elavil. 7. Type 2 diabetes: Currenlty on metformin and insulin.  Dispo: f/u after cath.  Kate Sable, M.D., F.A.C.C.

## 2013-11-16 NOTE — Telephone Encounter (Signed)
Left heart cath - Thursday, 10/1 - 7:30 - [place]  Checking percert

## 2013-11-18 ENCOUNTER — Ambulatory Visit (HOSPITAL_COMMUNITY)
Admission: RE | Admit: 2013-11-18 | Discharge: 2013-11-18 | Disposition: A | Discharge status: Home / Self Care | Payer: Medicare Other | Source: Ambulatory Visit | Attending: Cardiovascular Disease | Admitting: Cardiovascular Disease

## 2013-11-18 ENCOUNTER — Encounter (HOSPITAL_COMMUNITY)
Admission: RE | Disposition: A | Discharge status: Home / Self Care | Payer: Self-pay | Source: Ambulatory Visit | Attending: Cardiovascular Disease

## 2013-11-18 DIAGNOSIS — K449 Diaphragmatic hernia without obstruction or gangrene: Secondary | ICD-10-CM | POA: Diagnosis not present

## 2013-11-18 DIAGNOSIS — I2510 Atherosclerotic heart disease of native coronary artery without angina pectoris: Secondary | ICD-10-CM | POA: Insufficient documentation

## 2013-11-18 DIAGNOSIS — E119 Type 2 diabetes mellitus without complications: Secondary | ICD-10-CM | POA: Insufficient documentation

## 2013-11-18 DIAGNOSIS — I340 Nonrheumatic mitral (valve) insufficiency: Secondary | ICD-10-CM | POA: Diagnosis not present

## 2013-11-18 DIAGNOSIS — Z7982 Long term (current) use of aspirin: Secondary | ICD-10-CM | POA: Insufficient documentation

## 2013-11-18 DIAGNOSIS — R9431 Abnormal electrocardiogram [ECG] [EKG]: Secondary | ICD-10-CM | POA: Diagnosis present

## 2013-11-18 DIAGNOSIS — E785 Hyperlipidemia, unspecified: Secondary | ICD-10-CM | POA: Diagnosis not present

## 2013-11-18 DIAGNOSIS — Z9049 Acquired absence of other specified parts of digestive tract: Secondary | ICD-10-CM | POA: Diagnosis not present

## 2013-11-18 DIAGNOSIS — K219 Gastro-esophageal reflux disease without esophagitis: Secondary | ICD-10-CM | POA: Insufficient documentation

## 2013-11-18 DIAGNOSIS — Z79899 Other long term (current) drug therapy: Secondary | ICD-10-CM | POA: Insufficient documentation

## 2013-11-18 DIAGNOSIS — R0789 Other chest pain: Secondary | ICD-10-CM | POA: Diagnosis not present

## 2013-11-18 DIAGNOSIS — I10 Essential (primary) hypertension: Secondary | ICD-10-CM | POA: Insufficient documentation

## 2013-11-18 DIAGNOSIS — R000 Tachycardia, unspecified: Secondary | ICD-10-CM | POA: Insufficient documentation

## 2013-11-18 DIAGNOSIS — Z794 Long term (current) use of insulin: Secondary | ICD-10-CM | POA: Diagnosis not present

## 2013-11-18 DIAGNOSIS — R079 Chest pain, unspecified: Secondary | ICD-10-CM

## 2013-11-18 DIAGNOSIS — Z79890 Hormone replacement therapy (postmenopausal): Secondary | ICD-10-CM | POA: Insufficient documentation

## 2013-11-18 DIAGNOSIS — I209 Angina pectoris, unspecified: Secondary | ICD-10-CM

## 2013-11-18 HISTORY — PX: LEFT HEART CATHETERIZATION WITH CORONARY ANGIOGRAM: SHX5451

## 2013-11-18 LAB — GLUCOSE, CAPILLARY
GLUCOSE-CAPILLARY: 183 mg/dL — AB (ref 70–99)
Glucose-Capillary: 201 mg/dL — ABNORMAL HIGH (ref 70–99)

## 2013-11-18 LAB — BASIC METABOLIC PANEL
Anion gap: 16 — ABNORMAL HIGH (ref 5–15)
BUN: 23 mg/dL (ref 6–23)
CO2: 24 mEq/L (ref 19–32)
CREATININE: 0.85 mg/dL (ref 0.50–1.10)
Calcium: 9.1 mg/dL (ref 8.4–10.5)
Chloride: 100 mEq/L (ref 96–112)
GFR, EST AFRICAN AMERICAN: 84 mL/min — AB (ref >90)
GFR, EST NON AFRICAN AMERICAN: 72 mL/min — AB (ref >90)
Glucose, Bld: 208 mg/dL — ABNORMAL HIGH (ref 70–99)
Potassium: 3.9 mEq/L (ref 3.7–5.3)
Sodium: 140 mEq/L (ref 137–147)

## 2013-11-18 LAB — CBC
HCT: 34.6 % — ABNORMAL LOW (ref 36.0–46.0)
Hemoglobin: 11.4 g/dL — ABNORMAL LOW (ref 12.0–15.0)
MCH: 28.1 pg (ref 26.0–34.0)
MCHC: 32.9 g/dL (ref 30.0–36.0)
MCV: 85.4 fL (ref 78.0–100.0)
PLATELETS: 164 10*3/uL (ref 150–400)
RBC: 4.05 MIL/uL (ref 3.87–5.11)
RDW: 16.6 % — AB (ref 11.5–15.5)
WBC: 9.2 10*3/uL (ref 4.0–10.5)

## 2013-11-18 LAB — PROTIME-INR
INR: 1.08 (ref 0.00–1.49)
Prothrombin Time: 14 seconds (ref 11.6–15.2)

## 2013-11-18 SURGERY — LEFT HEART CATHETERIZATION WITH CORONARY ANGIOGRAM
Anesthesia: LOCAL

## 2013-11-18 MED ORDER — NITROGLYCERIN 1 MG/10 ML FOR IR/CATH LAB
INTRA_ARTERIAL | Status: AC
  Filled 2013-11-18: qty 10

## 2013-11-18 MED ORDER — MIDAZOLAM HCL 2 MG/2ML IJ SOLN
INTRAMUSCULAR | Status: AC
  Filled 2013-11-18: qty 2

## 2013-11-18 MED ORDER — ASPIRIN 81 MG PO CHEW
CHEWABLE_TABLET | ORAL | Status: AC
  Administered 2013-11-18: 81 mg via ORAL
  Filled 2013-11-18: qty 1

## 2013-11-18 MED ORDER — SODIUM CHLORIDE 0.9 % IJ SOLN
3.0000 mL | Freq: Two times a day (BID) | INTRAMUSCULAR | Status: DC
Start: 2013-11-18 — End: 2013-11-18

## 2013-11-18 MED ORDER — ASPIRIN 81 MG PO CHEW
81.0000 mg | CHEWABLE_TABLET | ORAL | Status: AC
  Administered 2013-11-18: 81 mg via ORAL

## 2013-11-18 MED ORDER — HEPARIN SODIUM (PORCINE) 1000 UNIT/ML IJ SOLN
INTRAMUSCULAR | Status: AC
  Filled 2013-11-18: qty 1

## 2013-11-18 MED ORDER — SODIUM CHLORIDE 0.9 % IV SOLN: 250.0000 mL | INTRAVENOUS | Status: DC | PRN

## 2013-11-18 MED ORDER — SODIUM CHLORIDE 0.9 % IJ SOLN: 3.0000 mL | INTRAMUSCULAR | Status: DC | PRN

## 2013-11-18 MED ORDER — SODIUM CHLORIDE 0.9 % IV SOLN: INTRAVENOUS | Status: AC

## 2013-11-18 MED ORDER — VERAPAMIL HCL 2.5 MG/ML IV SOLN
INTRAVENOUS | Status: AC
  Filled 2013-11-18: qty 2

## 2013-11-18 MED ORDER — HEPARIN (PORCINE) IN NACL 2-0.9 UNIT/ML-% IJ SOLN
INTRAMUSCULAR | Status: AC
  Filled 2013-11-18: qty 1500

## 2013-11-18 MED ORDER — FENTANYL CITRATE 0.05 MG/ML IJ SOLN
INTRAMUSCULAR | Status: AC
  Filled 2013-11-18: qty 2

## 2013-11-18 MED ORDER — LIDOCAINE HCL (PF) 1 % IJ SOLN
INTRAMUSCULAR | Status: AC
  Filled 2013-11-18: qty 30

## 2013-11-18 NOTE — Discharge Instructions (Signed)
Hold metformin for 48 hours.    Angiogram, Care After Refer to this sheet in the next few weeks. These instructions provide you with information on caring for yourself after your procedure. Your health care provider may also give you more specific instructions. Your treatment has been planned according to current medical practices, but problems sometimes occur. Call your health care provider if you have any problems or questions after your procedure.  WHAT TO EXPECT AFTER THE PROCEDURE After your procedure, it is typical to have the following sensations:  Minor discomfort or tenderness and a small bump at the catheter insertion site. The bump should usually decrease in size and tenderness within 1 to 2 weeks.  Any bruising will usually fade within 2 to 4 weeks. HOME CARE INSTRUCTIONS   You may need to keep taking blood thinners if they were prescribed for you. Take medicines only as directed by your health care provider.  Do not apply powder or lotion to the site.  Do not take baths, swim, or use a hot tub until your health care provider approves.  You may shower 24 hours after the procedure. Remove the bandage (dressing) and gently wash the site with plain soap and water. Gently pat the site dry.  Inspect the site at least twice daily.  Limit your activity for the first 48 hours. Do not bend, squat, or lift anything over 20 lb (9 kg) or as directed by your health care provider.  Plan to have someone take you home after the procedure. Follow instructions about when you can drive or return to work. SEEK MEDICAL CARE IF:  You get light-headed when standing up.  You have drainage (other than a small amount of blood on the dressing).  You have chills.  You have a fever.  You have redness, warmth, swelling, or pain at the insertion site. SEEK IMMEDIATE MEDICAL CARE IF:   You develop chest pain or shortness of breath, feel faint, or pass out.  You have bleeding, swelling larger  than a walnut, or drainage from the catheter insertion site.  You develop pain, discoloration, coldness, or severe bruising in the leg or arm that held the catheter.  You develop bleeding from any other place, such as the bowels. You may see bright red blood in your urine or stools, or your stools may appear black and tarry.  You have heavy bleeding from the site. If this happens, hold pressure on the site. MAKE SURE YOU:  Understand these instructions.  Will watch your condition.  Will get help right away if you are not doing well or get worse. Document Released: 08/23/2004 Document Revised: 06/21/2013 Document Reviewed: 06/29/2012 [facility] Patient Information 2015 [place], [place]. This information is not intended to replace advice given to you by your health care provider. Make sure you discuss any questions you have with your health care provider. Radial Site Care Refer to this sheet in the next few weeks. These instructions provide you with information on caring for yourself after your procedure. Your caregiver may also give you more specific instructions. Your treatment has been planned according to current medical practices, but problems sometimes occur. Call your caregiver if you have any problems or questions after your procedure. HOME CARE INSTRUCTIONS  You may shower the day after the procedure.Remove the bandage (dressing) and gently wash the site with plain soap and water.Gently pat the site dry.  Do not apply powder or lotion to the site.  Do not submerge the affected site in  water for 3 to 5 days.  Inspect the site at least twice daily.  Do not flex or bend the affected arm for 24 hours.  No lifting over 5 pounds (2.3 kg) for 5 days after your procedure.  Do not drive home if you are discharged the same day of the procedure. Have someone else drive you.  You may drive 24 hours after the procedure unless otherwise instructed by your caregiver.  Do not operate  machinery or power tools for 24 hours.  A responsible adult should be with you for the first 24 hours after you arrive home. What to expect:  Any bruising will usually fade within 1 to 2 weeks.  Blood that collects in the tissue (hematoma) may be painful to the touch. It should usually decrease in size and tenderness within 1 to 2 weeks. SEEK IMMEDIATE MEDICAL CARE IF:  You have unusual pain at the radial site.  You have redness, warmth, swelling, or pain at the radial site.  You have drainage (other than a small amount of blood on the dressing).  You have chills.  You have a fever or persistent symptoms for more than 72 hours.  You have a fever and your symptoms suddenly get worse.  Your arm becomes pale, cool, tingly, or numb.  You have heavy bleeding from the site. Hold pressure on the site. Document Released: 03/09/2010 Document Revised: 04/29/2011 Document Reviewed: 03/09/2010 [facility] Patient Information 2015 [place], [place]. This information is not intended to replace advice given to you by your health care provider. Make sure you discuss any questions you have with your health care provider.

## 2013-11-18 NOTE — H&P (View-Only) (Signed)
[name], female   DOB: 11/14/1950, 63 y.o.   MRN: [id number]      SUBJECTIVE: The patient returns for followup on cardiovascular testing performed for the evaluation of chest pain. Echocardiogram demonstrated mild LVH with LVEF 40-98%, grade 1 diastolic dysfunction, with a mildly thickened mitral valve with mild mitral regurgitation and normal PASP. Nuclear stress testing did not demonstrate any evidence of myocardial ischemia or scar with normal regional wall motion and left ventricular systolic function. ECG tracings demonstrated normal sinus rhythm with nonspecific ST segment abnormalities. It was deemed a low risk study. In summary, she is a 63 year old woman with a past medical history significant for essential hypertension, hyperlipidemia, diabetes mellitus, non-epileptic unresponsive episodes and anxiety with depression.  She has been experiencing a dull retrosternal chest pain for the past 6 months which routinely occurs with exertion including activities such as sweeping, doing the dishes, and showering. Her activities of daily living have been remarkably limited due to this. She has associated shortness of breath, dizziness and lightheadedness but denies leg swelling and syncope. She's had problems with her balance.   In spite of the addition of both metoprolol and Ranexa, she has had no symptom relief. She also provides a history of a hiatal hernia and GERD and does have dysphagia for solids with food occasionally getting stuck in her throat associated with choking. However, these symptoms appear to be different from her exertional chest pain accompanied by shortness of breath.    Review of Systems: As per "subjective", otherwise negative.  No Known Allergies  Current Outpatient Prescriptions  Medication Sig Dispense Refill  . albuterol (PROVENTIL HFA;VENTOLIN HFA) 108 (90 BASE) MCG/ACT inhaler Inhale 2 puffs into the lungs every 6 (six) hours as needed for  wheezing or shortness of breath.      [name] amitriptyline (ELAVIL) 10 MG tablet Take 20 mg by mouth at bedtime.      [name] aspirin EC 81 MG tablet Take 81 mg by mouth daily.      . cholecalciferol (VITAMIN D) 1000 UNITS tablet Take 1,000 Units by mouth daily.      [name] escitalopram (LEXAPRO) 20 MG tablet Take 20 mg by mouth daily with breakfast.       . insulin aspart (NOVOLOG FLEXPEN) 100 UNIT/ML FlexPen Inject into the skin 3 (three) times daily with meals.      . lamoTRIgine (LAMICTAL) 200 MG tablet Take 200 mg by mouth 2 (two) times daily.       [name] lisinopril-hydrochlorothiazide (PRINZIDE,ZESTORETIC) 20-12.5 MG per tablet Take 1 tablet by mouth daily.       [name] LORazepam (ATIVAN) 2 MG tablet Take 2 mg by mouth 2 (two) times daily.      [name] METFORMIN HCL PO Take 1,000 mg by mouth 2 (two) times daily before a meal.       . metoprolol succinate (TOPROL XL) 25 MG 24 hr tablet Take 1 tablet (25 mg total) by mouth daily.  30 tablet  6  . omeprazole (PRILOSEC) 20 MG capsule Take 20 mg by mouth daily.      . ranolazine (RANEXA) 500 MG 12 hr tablet Take 1 tablet (500 mg total) by mouth 2 (two) times daily.  60 tablet  2  . ranolazine (RANEXA) 500 MG 12 hr tablet Take 1 tablet (500 mg total) by mouth 2 (two) times daily.  14 tablet  0  . simvastatin (ZOCOR) 20 MG tablet Take 20 mg by mouth every evening.      [name]  valACYclovir (VALTREX) 500 MG tablet Take 500 mg by mouth daily.        No current facility-administered medications for this visit.    Past Medical History  Diagnosis Date  . GERD (gastroesophageal reflux disease)   . Diarrhea   . Hypertension   . Hormone replacement therapy (postmenopausal)   . Migraine   . Seizures   . IBS (irritable bowel syndrome)   . Functional ovarian cysts   . Diverticulosis of colon (without mention of hemorrhage)   . Hemorrhoids, external, without mention of complication   . Right knee DJD   . Shortness of breath   . Asthma   . Diabetes mellitus without complication     . Sleep apnea     no cpap    study 2 yrs ago    Past Surgical History  Procedure Laterality Date  . Cholecystectomy  1980  . Vaginal hysterectomy  1990  . Tonsillectomy    . Oophorectomy      2012 Dr. Adah Perl in [place]  . Tubal ligation    . Total knee arthroplasty Right 09/02/2012    Procedure: TOTAL KNEE ARTHROPLASTY- right;  Surgeon: Ninetta Lights, MD;  Location: [place];  Service: Orthopedics;  Laterality: Right;    History   Social History  . Marital Status: Married    Spouse Name: N/A    Number of Children: N/A  . Years of Education: N/A   Occupational History  . Not on file.   Social History Main Topics  . Smoking status: Never Smoker   . Smokeless tobacco: Never Used  . Alcohol Use: No  . Drug Use: No  . Sexual Activity: Not on file   Other Topics Concern  . Not on file   Social History Narrative  . No narrative on file     Filed Vitals:   11/16/13 1002  BP: 100/67  Pulse: 85  Height: 5' 4"  (1.626 m)  Weight: 195 lb (88.451 kg)    PHYSICAL EXAM General: NAD HEENT: Normal. Neck: No JVD, no thyromegaly. Lungs: Clear to auscultation bilaterally with normal respiratory effort. CV: Nondisplaced PMI.  Regular rate and rhythm, normal S1/S2, no S3/S4, no murmur. No pretibial or periankle edema.  No carotid bruit.  Normal pedal pulses.  Abdomen: Soft, nontender, no hepatosplenomegaly, no distention.  Neurologic: Alert and oriented x 3.  Psych: Normal affect. Skin: Normal. Musculoskeletal: Normal range of motion, no gross deformities. Extremities: No clubbing or cyanosis.   ECG: Most recent ECG reviewed.      ASSESSMENT AND PLAN: 1. Sinus tachycardia: I previously started Toprol-XL 25 mg daily.  2. Essential HTN: Well controlled on current therapy which includes Prinzide 20-12.5 mg daily.  3. Hyperlipidemia: Lipids on 08/23/2013 demonstrated total cholesterol 137, triglycerides 102, HDL 56, LDL 61. Continue current therapy with simvastatin 20 mg  daily.  4. Chest pain: Her ADL's have been significantly limited by her symptoms, which occur routinely with exertion. She has multiple cardiovascular risk factors including diabetes, HTN, and hyperlipidemia.      In spite of normal nuclear myocardial perfusion, I will proceed with coronary angiography. If this were found to be normal, I would pursue a GI workup with respect to her history of a hiatal hernia and GERD. 5. GERD/hiatal hernia: Will pursue workup after coronary angiography. 6. Depression: On Elavil. 7. Type 2 diabetes: Currenlty on metformin and insulin.  Dispo: f/u after cath.  Kate Sable, M.D., F.A.C.C.

## 2013-11-18 NOTE — Interval H&P Note (Signed)
History and Physical Interval Note:  11/18/2013 7:32 AM  [name]  has presented today for surgery, with the diagnosis of angina  The various methods of treatment have been discussed with the patient and family. After consideration of risks, benefits and other options for treatment, the patient has consented to  Procedure(s): LEFT HEART CATHETERIZATION WITH CORONARY ANGIOGRAM (N/A) as a surgical intervention .  The patient's history has been reviewed, patient examined, no change in status, stable for surgery.  I have reviewed the patient's chart and labs.  Questions were answered to the patient's satisfaction.    Cath Lab Visit (complete for each Cath Lab visit)  Clinical Evaluation Leading to the Procedure:   ACS: No.  Non-ACS:    Anginal Classification: CCS III  Anti-ischemic medical therapy: Minimal Therapy (1 class of medications)  Non-Invasive Test Results: Low-risk stress test findings: cardiac mortality <1%/year  Prior CABG: No previous CABG        MCALHANY,CHRISTOPHER

## 2013-11-18 NOTE — CV Procedure (Addendum)
      Cardiac Catheterization Operative Report  Carol Richmond [id number] 10/1/20158:13 AM HASANAJ,XAJE A, MD  Procedure Performed:  1. Left Heart Catheterization 2. Selective Coronary Angiography 3. Left ventricular angiogram 4. Mynx closure device right femoral artery  Operator: Lauree Chandler, MD  Arterial access site:  Right radial artery and right femoral artery. (Access in right radial artery but unable to advance catheters due to recurrent radial artery).   Indication:  63 yo female with history of HTN, DM with recent exertional chest pressure and SOB concerning for angina.                               Procedure Details: The risks, benefits, complications, treatment options, and expected outcomes were discussed with the patient. The patient and/or family concurred with the proposed plan, giving informed consent. The patient was brought to the cath lab after IV hydration was begun and oral premedication was given. The patient was further sedated with Versed and Fentanyl The right wrist was assessed with a reverse Allens test which was positive. The right wrist was prepped and draped in a sterile fashion. 1% lidocaine was used for local anesthesia. Using the modified Seldinger access technique, a 5 French sheath was placed in the right radial artery. 3 mg Verapamil was given through the sheath. I was unable to navigate the catheter around a loop in the radial artery. There appeared to be a recurrent radial artery. We then prepped and draped the right groin. 1% lidocaine was used for local anesthesia. A 5 French sheath was placed in the right femoral artery. Standard diagnostic catheters were used to perform selective coronary angiography. A pigtail catheter was used to perform a left ventricular angiogram. A Mynx closure device was placed in the right femoral artery. The sheath was removed from the right radial artery and a Terumo hemostasis band was applied at the arteriotomy  site on the right wrist.    There were no immediate complications. The patient was taken to the recovery area in stable condition.   Hemodynamic Findings: Central aortic pressure: 101/56 Left ventricular pressure: 107/5/8  Angiographic Findings:  Left main:  No obstructive disease.   Left Anterior Descending Artery:  Large caliber vessel that courses to the apex. Mild luminal irregularities mid vessel. The diagonal is a moderate caliber branch with ostial 20% stenosis.   Circumflex Artery: Large caliber vessel with large OM branch. Mild plaque OM branch.   Right Coronary Artery:  Large dominant vessel with 20% mid calcific stenosis.   Left Ventricular Angiogram: LVEF=55%.   Impression: 1. Mild non-obstructive CAD 2. Normal LV systolic function 3. Non-cardiac chest pain  Recommendations: Medical management of mild CAD. Further workup of chest pain may need to include GI evaluation.        Complications:  None. The patient tolerated the procedure well.

## 2013-11-26 ENCOUNTER — Ambulatory Visit: Payer: Medicare Other | Admitting: Neurology

## 2013-12-02 ENCOUNTER — Encounter (HOSPITAL_COMMUNITY): Payer: Self-pay | Admitting: Psychology

## 2013-12-02 NOTE — Progress Notes (Signed)
PROGRESS NOTE   Patient:  Carol Richmond   DOB: [birth year]  MR Number: [id number]  Location: [facility] [address] [place] [address] Dept: [phone]  09/23/2013  Start: 9 AM End: 10 AM  Provider/Observer:     Edgardo Roys PSYD  Chief Complaint:      Chief Complaint  Patient presents with  . Memory Loss  . Depression    Reason For Service:     I had initially seen the patient back in 2004 development of neuropsychological deficits. The patient had a history of seizures many years prior but had occasion where she started developing what appeared to be absence seizures or symptoms like that. She reported significant cognitive changes as well. The neuropsychological evaluation done back in 2010 appear to display or suggest left hemispheric dysfunction. That report can be found in her chart but I will ask the summary to this reason for service.  CONCLUSIONS:    The overall results of these formal and objective measures of cognitive performance to suggest some consistent neuropsychological deficits consistent with focal deficits in the left hemisphere.  The patients general cognitive functioning and IQ functioning was globally in the borderline range.  However, most of her performance-based measures typically associated with right hemisphere functioning were in the low average range with only a few in the mildly impaired range.  This is in contrast to the verbal subtest of these measures which fell in the moderate to severely impaired range producing a significant difference between her verbal abilities and her perceptual organizational abilities.  This difference was again found in specific testing of learning and memory.  Memory functions that are primarily related to left hemisphere functioning were severely impaired and were consistent with significant auditory learning and memory  deficits.  However, in marked contrast the patient produced average functioning relative to her age matched your group with regard to visual learning.  There was a consistent difference of between 30 and 40 points between her visual learning versus auditory learning in both immediate recall tasks as well as delayed recall tasks.  Taken into account this overall pattern of relative strengths and weaknesses is strongly consistent with something that goes well beyond any limited educational experience or underlying low average to borderline IQ function.  There were significant differences on measures that are typically associated with right hemispheric functioning compared to those measures typically associated with left hemispheric functioning.  The patient's performance suggested significant deficits and possible underlying neurological difficulties with regard to left hemispheric functioning.  More recently, the patient reports that she went to the emergency department 2 to increasingly feeling bad. She reports that when she is walking she is staggering more and having more word finding issues as well as emotional disturbance. The patient does have diabetes and has been trying to lose weight but has not been able to. The patient had knee replacement surgery and he continues to be hard for her to walk one year later. She reports that her depression before not wanting to do anything. The patient describes moderate to significant symptoms of depression, appetite disturbance, inability to work, cognitive difficulties, memory problems, excessive worrying, headache and other anxiety symptoms.  Interventions Strategy:  Cognitive/behavioral psychotherapeutic interventions  Participation Level:   Active  Participation Quality:  Appropriate      Behavioral Observation:  Fairly Groomed, Confused, and Depressed.   Current Psychosocial Factors: The patient reports that she is been  unable to really do much of  anything in her life that she began having significant cognitive difficulties that 2010. She reports that these difficulties and remained steady for some time but have recently worsened and she's been having more symptoms like she had back in 2010. She did have a prior history of seizure and there is likely some type of left hemisphere injury. However, her diabetes may also be playing a role in producing the possibility of small vessel disease as well.  Content of Session:   Reviewed her symptoms and work on therapeutic interventions to feel better coping skills and strategies. We talked about the possibility of doing repeat neuropsychological testing but not sure if that is really needed at this point.  Current Status:   The patient reports increasing depression and cognitive difficulties.  Patient Progress:   The patient reports that she has had an acute deterioration particularly with her mood recently but continues to have memory difficulties along with crying spells and no symptoms of depression.  Target Goals:   Target goals include addressing the issues primarily of her emotional dysfunction dysregulation.  Last Reviewed:   09/23/2013  Goals Addressed Today:    Goals addressed today have to do with better coping skills around her depression which is more acute issue right now. We do have to consider the possibility that she started having some seizures again and will watch for symptoms of that.  Impression/Diagnosis:   The patient has a history of seizures going back for many years. She began having acute symptoms back in 2010 about symptoms consistent with absence seizures as well as changes in left hemisphere neuropsychological functioning. Currently, the patient does describe symptoms consisting with increasing depression along with her memory difficulties.  Diagnosis:    Axis I: Major depressive disorder, recurrent episode, moderate  Seizure disorder

## 2013-12-02 NOTE — Progress Notes (Signed)
PROGRESS NOTE   Patient:  Carol Richmond   DOB: [birth year]  MR Number: [id number]  Location: [facility] [address] [place] [address] Dept: [phone]  09/23/2013  Start: 9 AM End: 10 AM  Provider/Observer:     Edgardo Roys PSYD  Chief Complaint:      Chief Complaint  Patient presents with  . Memory Loss  . Depression  . Anxiety    Reason For Service:     I had initially seen the patient back in 2004 development of neuropsychological deficits. The patient had a history of seizures many years prior but had occasion where she started developing what appeared to be absence seizures or symptoms like that. She reported significant cognitive changes as well. The neuropsychological evaluation done back in 2010 appear to display or suggest left hemispheric dysfunction. That report can be found in her chart but I will ask the summary to this reason for service.  CONCLUSIONS:    The overall results of these formal and objective measures of cognitive performance to suggest some consistent neuropsychological deficits consistent with focal deficits in the left hemisphere.  The patients general cognitive functioning and IQ functioning was globally in the borderline range.  However, most of her performance-based measures typically associated with right hemisphere functioning were in the low average range with only a few in the mildly impaired range.  This is in contrast to the verbal subtest of these measures which fell in the moderate to severely impaired range producing a significant difference between her verbal abilities and her perceptual organizational abilities.  This difference was again found in specific testing of learning and memory.  Memory functions that are primarily related to left hemisphere functioning were severely impaired and were consistent with significant auditory learning  and memory deficits.  However, in marked contrast the patient produced average functioning relative to her age matched your group with regard to visual learning.  There was a consistent difference of between 30 and 40 points between her visual learning versus auditory learning in both immediate recall tasks as well as delayed recall tasks.  Taken into account this overall pattern of relative strengths and weaknesses is strongly consistent with something that goes well beyond any limited educational experience or underlying low average to borderline IQ function.  There were significant differences on measures that are typically associated with right hemispheric functioning compared to those measures typically associated with left hemispheric functioning.  The patient's performance suggested significant deficits and possible underlying neurological difficulties with regard to left hemispheric functioning.  More recently, the patient reports that she went to the emergency department 2 to increasingly feeling bad. She reports that when she is walking she is staggering more and having more word finding issues as well as emotional disturbance. The patient does have diabetes and has been trying to lose weight but has not been able to. The patient had knee replacement surgery and he continues to be hard for her to walk one year later. She reports that her depression before not wanting to do anything. The patient describes moderate to significant symptoms of depression, appetite disturbance, inability to work, cognitive difficulties, memory problems, excessive worrying, headache and other anxiety symptoms.  Interventions Strategy:  Cognitive/behavioral psychotherapeutic interventions  Participation Level:   Active  Participation Quality:  Appropriate      Behavioral Observation:  Fairly Groomed, Confused, and Depressed.   Current Psychosocial Factors: The patient reports that  she has been doing well but better  since I saw her last time but continues to have crying spells and memory difficulties. She reports that she has worked on some coping skills develop and then working on.  Content of Session:   Reviewed her symptoms and work on therapeutic interventions to feel better coping skills and strategies. We talked about the possibility of doing repeat neuropsychological testing but not sure if that is really needed at this point.  Current Status:   The patient reports the depression symptoms have improved somewhat but continues to have significant memory difficulties as well as crying spells.  Patient Progress:   The patient reports that she has had an acute deterioration particularly with her mood recently but continues to have memory difficulties along with crying spells and no symptoms of depression.  Target Goals:   Target goals include addressing the issues primarily of her emotional dysfunction dysregulation.  Last Reviewed:   10/15/2013  Goals Addressed Today:    Goals addressed today have to do with better coping skills around her depression which is more acute issue right now. We do have to consider the possibility that she started having some seizures again and will watch for symptoms of that.  Impression/Diagnosis:   The patient has a history of seizures going back for many years. She began having acute symptoms back in 2010 about symptoms consistent with absence seizures as well as changes in left hemisphere neuropsychological functioning. Currently, the patient does describe symptoms consisting with increasing depression along with her memory difficulties.  Diagnosis:    Axis I: Major depressive disorder, recurrent episode, moderate  Seizure disorder

## 2013-12-03 ENCOUNTER — Ambulatory Visit (INDEPENDENT_AMBULATORY_CARE_PROVIDER_SITE_OTHER): Payer: Medicare Other | Admitting: Neurology

## 2013-12-03 ENCOUNTER — Encounter: Payer: Self-pay | Admitting: Neurology

## 2013-12-03 VITALS — BP 104/70 | HR 83 | Resp 16 | Ht 64.0 in | Wt 194.0 lb

## 2013-12-03 DIAGNOSIS — R404 Transient alteration of awareness: Secondary | ICD-10-CM

## 2013-12-03 DIAGNOSIS — R51 Headache: Secondary | ICD-10-CM

## 2013-12-03 DIAGNOSIS — R519 Headache, unspecified: Secondary | ICD-10-CM

## 2013-12-03 MED ORDER — AMITRIPTYLINE HCL 10 MG PO TABS: ORAL_TABLET | ORAL | Status: DC

## 2013-12-03 NOTE — Progress Notes (Signed)
Done

## 2013-12-03 NOTE — Patient Instructions (Signed)
1. Increase amitriptyline 77m: Take 3 tablets at bedtime 2. Continue follow-up with psychiatrist and psychologist 3. [place] driving laws: Stop driving for any episode of loss of consciousness or awareness, until 6 months event-free 4. Follow-up in 3 months

## 2013-12-03 NOTE — Progress Notes (Signed)
NEUROLOGY FOLLOW UP OFFICE NOTE  Carol Richmond [id number]  HISTORY OF PRESENT ILLNESS: I had the pleasure of seeing Carol Richmond in follow-up in the neurology clinic on 12/03/2013.  The patient was last seen 3 months ago and is accompanied by her husband today. She has a history of episodes of decreased responsiveness that were non-epileptic, possibly sleep attacks. She had presented with episodes of feeling unsteady followed by slurred speech and word-finding difficulties. MRI brain did not show any acute changes, 24-hour EEG showed mild diffuse slowing, no epileptiform discharges. Symptoms felt to be related to stress.  She had reported continued headaches and started amitriptyline on her last visit, currently at 49m qhs with improvement in her headaches. She states they are occurring half as often. She feels sleep is a little better as well.  She feels her balance is poor due to right knee swelling and pain.  No falls. She still has times where she would forget what she is talking about and gets frustrated, saying "oh just forget it."  She has been seeing a therapist, her husband feels this is helping, but she states that she continues to have sudden crying spells.  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 Records from her neurologist from 2009 to 2013 were reviewed. She was having episodes of abnormal smell like inhaling anesthesia, followed by breathing problems, numbness, disorientation, memory loss, difficulty finding words, and mild staggering gait. She was started on Keppra, then switched to Topamax. A 48-hour EEG in 2010 captured 2 typical spells with no associated EEG changes. Baseline EEG was normal. She started seeing a psychologist and psychiatrist, Topamax was tapered and she was started on Lamictal for mood stabilization. She was told by the psychologist that she has  "absent seizures" and that "the left side of her brain is getting worse, and this could be affected by her sleep apnea." [facility] episodes of unresponsiveness could be due to sleep attacks. She has a diagnosis of obstructive sleep apnea. It appears the spells resolved in 2011.   Since November 2014 or so, she has had episodes of feeling off balance with speech difficulties where speech is slurred and she is unable to find appropriate words. [facility], could not keep her balance, then felt her speech was going. They were at a birthday party on 05/09/13 when it looked like she was waking up, then started "acting like a drunk person." She has been having episodes of blanking out "like she is out in space," occurring 2-3 times a week. She states these are different from the spells she had in 2009. Her husband reports that each time is worse than the other. She has been having headaches on a near daily basis. Headaches are over the vertex or bilateral temporal regions. She hears a "boom" inside her head and wants to pull her hair out. There is no associated nausea/vomiting/photo or phonophobia. She asked if these episodes can be caused by stress, reporting that she has been having more episodes since she has been taking care of her sister the past 3 months. She would "cry over nothing." She has been taking Lexapro for many years and feels that "it does not seem to be working." She takes Ativan 269mtwice a day for anxiety.   Diagnostic Data:  I personally reviewed MRI brain without contrast which did not show any acute changes, there was moderate chronic white matter microvascular disease, hippocampi symmetric with no abnormal signal seen. Her 24-hour EEG was mildly abnormal due to mild diffuse slowing of the background, no epileptiform discharges or electrographic seizures seen. She did not complete diary, but  tells me today that she did have an episode where she felt she had a seizure and looked like she fell asleep.   PAST MEDICAL HISTORY: Past Medical History  Diagnosis Date  . GERD (gastroesophageal reflux disease)   . Diarrhea   . Hypertension   . Hormone replacement therapy (postmenopausal)   . Migraine   . Seizures   . IBS (irritable bowel syndrome)   . Functional ovarian cysts   . Diverticulosis of colon (without mention of hemorrhage)   . Hemorrhoids, external, without mention of complication   . Right knee DJD   . Shortness of breath   . Asthma   . Diabetes mellitus without complication   . Sleep apnea     no cpap    study 2 yrs ago    MEDICATIONS: Current Outpatient Prescriptions on File Prior to Visit  Medication Sig Dispense Refill  . albuterol (PROVENTIL HFA;VENTOLIN HFA) 108 (90 BASE) MCG/ACT inhaler Inhale 2 puffs into the lungs every 6 (six) hours as needed for wheezing or shortness of breath.      [name] amitriptyline (ELAVIL) 10 MG tablet Take 10 mg by mouth at bedtime.       [name] aspirin EC 81 MG tablet Take 81 mg by mouth daily.      . Canagliflozin (INVOKANA) 100 MG TABS Take 100 mg by mouth daily.      . cholecalciferol (VITAMIN D) 1000 UNITS tablet Take 1,000 Units by mouth daily.      [name] escitalopram (LEXAPRO) 20 MG tablet Take 20 mg by mouth daily with breakfast.       . insulin aspart (NOVOLOG FLEXPEN) 100 UNIT/ML FlexPen Inject 5-15 Units into the skin 3 (three) times daily with meals. SLIDING SCALE      . lamoTRIgine (LAMICTAL) 200 MG tablet Take 200 mg by mouth 2 (two) times daily.       [name] lisinopril-hydrochlorothiazide (PRINZIDE,ZESTORETIC) 20-12.5 MG per tablet Take 1 tablet by mouth daily.       [name] LORazepam (ATIVAN) 2 MG tablet Take 2 mg by mouth 2 (two) times daily.      . metoprolol succinate (TOPROL XL) 25 MG 24 hr tablet Take 1 tablet (25 mg total) by mouth daily.  30 tablet  6  . omeprazole (PRILOSEC) 20 MG capsule Take 20 mg by mouth daily.      .  ranolazine (RANEXA) 500 MG 12 hr tablet Take 1 tablet (500 mg total) by mouth 2 (two) times daily.  60 tablet  2  . simvastatin (ZOCOR) 20 MG tablet Take 20 mg by mouth every evening.      . valACYclovir (VALTREX) 500 MG tablet Take 500 mg by mouth daily.        No current facility-administered medications on file prior to visit.    ALLERGIES: No Known Allergies  FAMILY HISTORY: Family History  Problem Relation Age of Onset  . Heart disease Mother   . Diabetes Mother   . Heart disease Brother   . Cancer Sister   . Heart disease Brother   . Heart disease Brother     SOCIAL HISTORY: History   Social History  .  Marital Status: Married    Spouse Name: N/A    Number of Children: N/A  . Years of Education: N/A   Occupational History  . Not on file.   Social History Main Topics  . Smoking status: Never Smoker   . Smokeless tobacco: Never Used  . Alcohol Use: No  . Drug Use: No  . Sexual Activity: Not on file   Other Topics Concern  . Not on file   Social History Narrative  . No narrative on file    REVIEW OF SYSTEMS: Constitutional: No fevers, chills, or sweats, no generalized fatigue, change in appetite Eyes: No visual changes, double vision, eye pain Ear, nose and throat: No hearing loss, ear pain, nasal congestion, sore throat Cardiovascular: No chest pain, palpitations Respiratory:  No shortness of breath at rest or with exertion, wheezes GastrointestinaI: No nausea, vomiting, diarrhea, abdominal pain, fecal incontinence Genitourinary:  No dysuria, urinary retention or frequency Musculoskeletal:  No neck pain, back pain Integumentary: No rash, pruritus, skin lesions Neurological: as above Psychiatric: + depression, insomnia, anxiety Endocrine: No palpitations, fatigue, diaphoresis, mood swings, change in appetite, change in weight, increased thirst Hematologic/Lymphatic:  No anemia, purpura, petechiae. Allergic/Immunologic: no itchy/runny eyes, nasal  congestion, recent allergic reactions, rashes  PHYSICAL EXAM: Filed Vitals:   12/03/13 1229  BP: 104/70  Pulse: 83  Resp: 16   General: No acute distress Head:  Normocephalic/atraumatic Neck: supple, no paraspinal tenderness, full range of motion Heart:  Regular rate and rhythm Lungs:  Clear to auscultation bilaterally Back: No paraspinal tenderness Skin/Extremities: No rash, no edema Neurological Exam: alert and oriented to person, place, and time. No aphasia or dysarthria. Fund of knowledge is appropriate.  Recent and remote memory are intact.  Attention and concentration are normal.    Able to name objects and repeat phrases. Cranial nerves: Pupils equal, round, reactive to light.  Fundoscopic exam unremarkable, no papilledema. Extraocular movements intact with no nystagmus. Visual fields full. Facial sensation intact. No facial asymmetry. Tongue, uvula, palate midline.  Motor: Bulk and tone normal, muscle strength 5/5 throughout with no pronator drift.  Sensation to light touch intact.  No extinction to double simultaneous stimulation.  Deep tendon reflexes 2+ throughout, toes downgoing.  Finger to nose testing intact.  Gait narrow-based and steady, able to tandem walk adequately.  Romberg negative.  IMPRESSION: This is a 63 yo RH woman with a history of diabetes, hyperlipidemia, headaches, and non-epileptic episodes of decreased responsiveness, who presented with episodes of feeling off balance with slurred speech. She reports these are different from her "absent seizures" in 2009. MRI brain and 24-hour EEG unremarkable. The etiology of these symptoms appear related to stress. Her husband noted an improvement in word-finding difficulties since she started psychotherapy, however she continues to have them occasionally. She also became tearful in the office and stated that she suddenly just cries. Continue therapy and recommend psychiatry evaluation as well.  She started amitriptyline for  chronic daily headaches, with improvement on low dose 51m qhs. She will increase to 391mqhs and follow-up in 3 months.    Thank you for allowing me to participate in her care.  Please do not hesitate to call for any questions or concerns.  The duration of this appointment visit was 15 minutes of face-to-face time with the patient.  Greater than 50% of this time was spent in counseling, explanation of diagnosis, planning of further management, and coordination of care.   KaEllouise NewerM.D.   CC: Dr. HaSherrie Sport

## 2013-12-07 ENCOUNTER — Ambulatory Visit (INDEPENDENT_AMBULATORY_CARE_PROVIDER_SITE_OTHER): Payer: Medicare Other | Admitting: Cardiovascular Disease

## 2013-12-07 ENCOUNTER — Encounter: Payer: Self-pay | Admitting: Cardiovascular Disease

## 2013-12-07 VITALS — BP 108/69 | HR 94 | Ht 64.0 in | Wt 194.0 lb

## 2013-12-07 DIAGNOSIS — E119 Type 2 diabetes mellitus without complications: Secondary | ICD-10-CM

## 2013-12-07 DIAGNOSIS — F32A Depression, unspecified: Secondary | ICD-10-CM

## 2013-12-07 DIAGNOSIS — E785 Hyperlipidemia, unspecified: Secondary | ICD-10-CM

## 2013-12-07 DIAGNOSIS — R079 Chest pain, unspecified: Secondary | ICD-10-CM

## 2013-12-07 DIAGNOSIS — K219 Gastro-esophageal reflux disease without esophagitis: Secondary | ICD-10-CM

## 2013-12-07 DIAGNOSIS — R000 Tachycardia, unspecified: Secondary | ICD-10-CM

## 2013-12-07 DIAGNOSIS — K449 Diaphragmatic hernia without obstruction or gangrene: Secondary | ICD-10-CM

## 2013-12-07 DIAGNOSIS — F329 Major depressive disorder, single episode, unspecified: Secondary | ICD-10-CM

## 2013-12-07 DIAGNOSIS — R0602 Shortness of breath: Secondary | ICD-10-CM

## 2013-12-07 DIAGNOSIS — I10 Essential (primary) hypertension: Secondary | ICD-10-CM

## 2013-12-07 DIAGNOSIS — R1310 Dysphagia, unspecified: Secondary | ICD-10-CM

## 2013-12-07 MED ORDER — METOPROLOL SUCCINATE ER 50 MG PO TB24: 50.0000 mg | ORAL_TABLET | Freq: Every day | ORAL | Status: DC

## 2013-12-07 NOTE — Addendum Note (Signed)
Addended by: Laurine Blazer on: 12/07/2013 02:48 PM   Modules accepted: Orders, Medications

## 2013-12-07 NOTE — Patient Instructions (Signed)
   Stop Ranexa  Increase Toprol XL to 5m daily (may take 2 of the 226mtablets daily till finish current supply) - new sent to pharm Continue all other medications.   Please call office with update on shortness of breath in about 4-6 weeks.  Follow up in  4-6 months

## 2013-12-07 NOTE — Progress Notes (Signed)
[name], female   DOB: 01/24/1951, 63 y.o.   MRN: [id number]      SUBJECTIVE: The patient returns for followup after undergoing coronary angiography, which demonstrated mild nonobstructive coronary artery disease with only 20% lesions seen. She continues to experience shortness of breath when walking across a parking lot. She denies a history of tobacco use or pesticide exposure. She continues to have dysphagia for solids. She has not seen gastroenterology in several years.   Review of Systems: As per "subjective", otherwise negative.  No Known Allergies  Current Outpatient Prescriptions  Medication Sig Dispense Refill  . albuterol (PROVENTIL HFA;VENTOLIN HFA) 108 (90 BASE) MCG/ACT inhaler Inhale 2 puffs into the lungs every 6 (six) hours as needed for wheezing or shortness of breath.      [name] amitriptyline (ELAVIL) 10 MG tablet Take 3 tablets at bedtime  90 tablet  4  . aspirin EC 81 MG tablet Take 81 mg by mouth daily.      . Canagliflozin (INVOKANA) 100 MG TABS Take 100 mg by mouth daily.      . cholecalciferol (VITAMIN D) 1000 UNITS tablet Take 1,000 Units by mouth daily.      [name] escitalopram (LEXAPRO) 20 MG tablet Take 20 mg by mouth daily with breakfast.       . insulin aspart (NOVOLOG FLEXPEN) 100 UNIT/ML FlexPen Inject 5-15 Units into the skin 3 (three) times daily with meals. SLIDING SCALE      . lamoTRIgine (LAMICTAL) 200 MG tablet Take 200 mg by mouth 2 (two) times daily.       [name] lisinopril-hydrochlorothiazide (PRINZIDE,ZESTORETIC) 20-12.5 MG per tablet Take 1 tablet by mouth daily.       [name] LORazepam (ATIVAN) 2 MG tablet Take 2 mg by mouth 2 (two) times daily.      . metoprolol succinate (TOPROL XL) 25 MG 24 hr tablet Take 1 tablet (25 mg total) by mouth daily.  30 tablet  6  . omeprazole (PRILOSEC) 20 MG capsule Take 20 mg by mouth daily.      . ranolazine (RANEXA) 500 MG 12 hr tablet Take 1 tablet (500 mg total) by mouth 2 (two) times daily.  60 tablet  2    . simvastatin (ZOCOR) 20 MG tablet Take 20 mg by mouth every evening.      . valACYclovir (VALTREX) 500 MG tablet Take 500 mg by mouth daily.        No current facility-administered medications for this visit.    Past Medical History  Diagnosis Date  . GERD (gastroesophageal reflux disease)   . Diarrhea   . Hypertension   . Hormone replacement therapy (postmenopausal)   . Migraine   . Seizures   . IBS (irritable bowel syndrome)   . Functional ovarian cysts   . Diverticulosis of colon (without mention of hemorrhage)   . Hemorrhoids, external, without mention of complication   . Right knee DJD   . Shortness of breath   . Asthma   . Diabetes mellitus without complication   . Sleep apnea     no cpap    study 2 yrs ago    Past Surgical History  Procedure Laterality Date  . Cholecystectomy  1980  . Vaginal hysterectomy  1990  . Tonsillectomy    . Oophorectomy      2012 Dr. Adah Perl in [place]  . Tubal ligation    . Total knee arthroplasty Right 09/02/2012    Procedure: TOTAL KNEE ARTHROPLASTY- right;  Surgeon: Ninetta Lights, MD;  Location: [facility];  Service: Orthopedics;  Laterality: Right;    History   Social History  . Marital Status: Married    Spouse Name: N/A    Number of Children: N/A  . Years of Education: N/A   Occupational History  . Not on file.   Social History Main Topics  . Smoking status: Never Smoker   . Smokeless tobacco: Never Used  . Alcohol Use: No  . Drug Use: No  . Sexual Activity: Not on file   Other Topics Concern  . Not on file   Social History Narrative  . No narrative on file    BP 108/69  Pulse 94  Weight 194 lb (87.998 kg) Height 5' 4"  (1.626 m)    PHYSICAL EXAM General: NAD HEENT: Normal. Neck: No JVD, no thyromegaly. Lungs: Clear to auscultation bilaterally with normal respiratory effort. CV: Nondisplaced PMI.  Regular rate and rhythm, normal S1/S2, no S3/S4, no murmur. No pretibial or periankle edema.  No carotid bruit.   Normal pedal pulses.  Abdomen: Soft, nontender, no hepatosplenomegaly, no distention.  Neurologic: Alert and oriented x 3.  Psych: Normal affect. Skin: Normal. Musculoskeletal: Normal range of motion, no gross deformities. Extremities: No clubbing or cyanosis.   ECG: Most recent ECG reviewed.    ASSESSMENT AND PLAN: 1. Shortness of breath/inappropriate sinus tachycardia: I previously started Toprol-XL 25 mg daily. HR 94 bpm today. I wonder if her HR increased rapidly with minimal exertion, and this leads to her shortness of breath. Will increase to 50 mg daily. I have asked her to call me in 4-6 weeks to let me know if her symptoms have improved. If not, I would then consider PFT's and perhaps a pulmonary referral. No abnormal physical exam findings today. 2. Essential HTN: Well controlled on current therapy which includes Prinzide 20-12.5 mg daily.  3. Hyperlipidemia: Lipids on 08/23/2013 demonstrated total cholesterol 137, triglycerides 102, HDL 56, LDL 61. Continue current therapy with simvastatin 20 mg daily.  4. Chest pain: Mild nonobstructive CAD seen. I will pursue a GI workup with respect to her history of a hiatal hernia and GERD. I will d/c Ranexa. She has seen Dr. Britta Mccreedy in the past, and will call to make an appt. 5. GERD/hiatal hernia: Will pursue workup given chest pain with hiatal hernia and GERD. She has seen Dr. Britta Mccreedy in the past, and will call to make an appt. 6. Depression: On Elavil.  7. Type 2 diabetes: Currenlty on metformin and insulin.   Dispo: f/u 4-6 months.  Kate Sable, M.D., F.A.C.C.

## 2013-12-13 ENCOUNTER — Telehealth: Payer: Self-pay | Admitting: *Deleted

## 2013-12-13 DIAGNOSIS — K449 Diaphragmatic hernia without obstruction or gangrene: Secondary | ICD-10-CM

## 2013-12-13 DIAGNOSIS — R1310 Dysphagia, unspecified: Secondary | ICD-10-CM

## 2013-12-13 DIAGNOSIS — K219 Gastro-esophageal reflux disease without esophagitis: Secondary | ICD-10-CM

## 2013-12-13 NOTE — Telephone Encounter (Signed)
Patient would like for [place] to call Dr. Britta Mccreedy for GI appointment.  Stated that she did not know what to tell them.  This was discussed at last OV with Dr. Bronson Ing.   Referral order put in & forwarded to [facility]) for scheduling.

## 2013-12-15 ENCOUNTER — Ambulatory Visit (INDEPENDENT_AMBULATORY_CARE_PROVIDER_SITE_OTHER): Payer: No Typology Code available for payment source | Admitting: Psychology

## 2013-12-15 ENCOUNTER — Encounter (HOSPITAL_COMMUNITY): Payer: Self-pay | Admitting: Psychology

## 2013-12-15 DIAGNOSIS — F331 Major depressive disorder, recurrent, moderate: Secondary | ICD-10-CM

## 2013-12-15 DIAGNOSIS — G40909 Epilepsy, unspecified, not intractable, without status epilepticus: Secondary | ICD-10-CM

## 2013-12-15 NOTE — Progress Notes (Signed)
PROGRESS NOTE   Patient:  Carol Richmond   DOB: 18-Mar-1950  MR Number: [id number]  Location: [facility] [address] [place] [address] Dept: [phone]  09/23/2013  Start: 9 AM End: 10 AM  Provider/Observer:     Edgardo Roys PSYD  Chief Complaint:      Chief Complaint  Patient presents with  . Depression  . Other  . Memory Loss    Reason For Service:     I had initially seen the patient back in 2004 development of neuropsychological deficits. The patient had a history of seizures many years prior but had occasion where she started developing what appeared to be absence seizures or symptoms like that. She reported significant cognitive changes as well. The neuropsychological evaluation done back in 2010 appear to display or suggest left hemispheric dysfunction. That report can be found in her chart but I will ask the summary to this reason for service.  CONCLUSIONS:    The overall results of these formal and objective measures of cognitive performance to suggest some consistent neuropsychological deficits consistent with focal deficits in the left hemisphere.  The patients general cognitive functioning and IQ functioning was globally in the borderline range.  However, most of her performance-based measures typically associated with right hemisphere functioning were in the low average range with only a few in the mildly impaired range.  This is in contrast to the verbal subtest of these measures which fell in the moderate to severely impaired range producing a significant difference between her verbal abilities and her perceptual organizational abilities.  This difference was again found in specific testing of learning and memory.  Memory functions that are primarily related to left hemisphere functioning were severely impaired and were consistent with significant auditory learning  and memory deficits.  However, in marked contrast the patient produced average functioning relative to her age matched your group with regard to visual learning.  There was a consistent difference of between 30 and 40 points between her visual learning versus auditory learning in both immediate recall tasks as well as delayed recall tasks.  Taken into account this overall pattern of relative strengths and weaknesses is strongly consistent with something that goes well beyond any limited educational experience or underlying low average to borderline IQ function.  There were significant differences on measures that are typically associated with right hemispheric functioning compared to those measures typically associated with left hemispheric functioning.  The patient's performance suggested significant deficits and possible underlying neurological difficulties with regard to left hemispheric functioning.  More recently, the patient reports that she went to the emergency department 2 to increasingly feeling bad. She reports that when she is walking she is staggering more and having more word finding issues as well as emotional disturbance. The patient does have diabetes and has been trying to lose weight but has not been able to. The patient had knee replacement surgery and he continues to be hard for her to walk one year later. She reports that her depression before not wanting to do anything. The patient describes moderate to significant symptoms of depression, appetite disturbance, inability to work, cognitive difficulties, memory problems, excessive worrying, headache and other anxiety symptoms.  Interventions Strategy:  Cognitive/behavioral psychotherapeutic interventions  Participation Level:   Active  Participation Quality:  Appropriate      Behavioral Observation:  Fairly Groomed, Confused, and Depressed.   Current Psychosocial Factors: The patient reports that  she has continued to improve some  over time.  She has started to have more interactions with others and working on getting more physical activities.  Content of Session:   Reviewed her symptoms and work on therapeutic interventions to feel better coping skills and strategies. We talked about the possibility of doing repeat neuropsychological testing but not sure if that is really needed at this point.  Current Status:   The patient reports the depression symptoms have improved somewhat but continues to have significant memory difficulties as well as crying spells.  However, the patient reports that all of these symptoms are improving over the levels when first seen.  Patient Progress:   The patient reports that she has had an acute deterioration particularly with her mood recently but continues to have memory difficulties along with crying spells and no symptoms of depression.  Target Goals:   Target goals include addressing the issues primarily of her emotional dysfunction dysregulation.  Last Reviewed:   12/15/2013  Goals Addressed Today:    Goals addressed today have to do with better coping skills around her depression which is more acute issue right now. We do have to consider the possibility that she started having some seizures again and will watch for symptoms of that.  Impression/Diagnosis:   The patient has a history of seizures going back for many years. She began having acute symptoms back in 2010 about symptoms consistent with absence seizures as well as changes in left hemisphere neuropsychological functioning. Currently, the patient does describe symptoms consisting with increasing depression along with her memory difficulties.  Diagnosis:    Axis I: Major depressive disorder, recurrent episode, moderate  Seizure disorder

## 2013-12-21 ENCOUNTER — Telehealth: Payer: Self-pay | Admitting: Cardiovascular Disease

## 2013-12-21 NOTE — Telephone Encounter (Signed)
Carol Richmond went to pharmacy today to pick up Toprol XL to 75m daily was told the RX would be $400.00. She states she can not afford this. Do we have samples Or can another prescription be called in. Please call patient.

## 2013-12-21 NOTE — Telephone Encounter (Signed)
Mrs. Casino called stating that she had given [place] the wrong prescription information. It was a medication given to her from Dr. Sherrie Sport. She will call Dr. Sherrie Sport office in regards To that medication.

## 2014-01-27 ENCOUNTER — Encounter (HOSPITAL_COMMUNITY): Payer: Self-pay | Admitting: Cardiovascular Disease

## 2014-03-02 ENCOUNTER — Telehealth: Payer: Self-pay | Admitting: Neurology

## 2014-03-02 NOTE — Telephone Encounter (Signed)
Pt resch appt from 03-04-14 to 03-17-14

## 2014-03-04 ENCOUNTER — Ambulatory Visit: Payer: Medicare Other | Admitting: Neurology

## 2014-03-15 ENCOUNTER — Telehealth: Payer: Self-pay | Admitting: Neurology

## 2014-03-15 NOTE — Telephone Encounter (Signed)
Pt called and resch appt from 03-17-14 to 04-22-14

## 2014-03-17 ENCOUNTER — Ambulatory Visit: Payer: Self-pay | Admitting: Neurology

## 2014-04-15 ENCOUNTER — Encounter: Payer: Self-pay | Admitting: Cardiovascular Disease

## 2014-04-15 ENCOUNTER — Ambulatory Visit (INDEPENDENT_AMBULATORY_CARE_PROVIDER_SITE_OTHER): Payer: Medicare Other | Admitting: Cardiovascular Disease

## 2014-04-15 VITALS — BP 114/71 | HR 81 | Ht 64.0 in | Wt 194.0 lb

## 2014-04-15 DIAGNOSIS — E119 Type 2 diabetes mellitus without complications: Secondary | ICD-10-CM

## 2014-04-15 DIAGNOSIS — K219 Gastro-esophageal reflux disease without esophagitis: Secondary | ICD-10-CM

## 2014-04-15 DIAGNOSIS — I471 Supraventricular tachycardia: Secondary | ICD-10-CM

## 2014-04-15 DIAGNOSIS — E785 Hyperlipidemia, unspecified: Secondary | ICD-10-CM

## 2014-04-15 DIAGNOSIS — F329 Major depressive disorder, single episode, unspecified: Secondary | ICD-10-CM

## 2014-04-15 DIAGNOSIS — R079 Chest pain, unspecified: Secondary | ICD-10-CM

## 2014-04-15 DIAGNOSIS — K449 Diaphragmatic hernia without obstruction or gangrene: Secondary | ICD-10-CM

## 2014-04-15 DIAGNOSIS — R1310 Dysphagia, unspecified: Secondary | ICD-10-CM

## 2014-04-15 DIAGNOSIS — F32A Depression, unspecified: Secondary | ICD-10-CM

## 2014-04-15 DIAGNOSIS — R000 Tachycardia, unspecified: Secondary | ICD-10-CM

## 2014-04-15 DIAGNOSIS — I10 Essential (primary) hypertension: Secondary | ICD-10-CM

## 2014-04-15 MED ORDER — RANOLAZINE ER 500 MG PO TB12: 500.0000 mg | ORAL_TABLET | Freq: Two times a day (BID) | ORAL | Status: DC

## 2014-04-15 NOTE — Progress Notes (Signed)
[name], female   DOB: 1950-08-24, 64 y.o.   MRN: [id number]      SUBJECTIVE: The patient returns for routine follow up. She saw a GI specialist in [place] who did not pursue an EGD. She continues to complain of dysphagia for solids to the point of choking. She also continues to experience exertional chest discomfort with routine household activities. She feels the metoprolol has helped to some degree. She reportedly also has a history of a hiatal hernia.   Review of Systems: As per "subjective", otherwise negative.  No Known Allergies  Current Outpatient Prescriptions  Medication Sig Dispense Refill  . albuterol (PROVENTIL HFA;VENTOLIN HFA) 108 (90 BASE) MCG/ACT inhaler Inhale 2 puffs into the lungs every 6 (six) hours as needed for wheezing or shortness of breath.    [name] aspirin EC 81 MG tablet Take 81 mg by mouth daily.    . busPIRone (BUSPAR) 30 MG tablet Take 30 mg by mouth 2 (two) times daily.    . cholecalciferol (VITAMIN D) 1000 UNITS tablet Take 1,000 Units by mouth daily.    . Insulin Lispro, Human, (HUMALOG KWIKPEN [place]) Inject into the skin as directed.    . lamoTRIgine (LAMICTAL) 200 MG tablet Take 200 mg by mouth 2 (two) times daily.     [name] lisinopril-hydrochlorothiazide (PRINZIDE,ZESTORETIC) 20-12.5 MG per tablet Take 1 tablet by mouth daily.     . meloxicam (MOBIC) 15 MG tablet Take 15 mg by mouth daily.    . metFORMIN (GLUCOPHAGE) 1000 MG tablet Take 1,000 mg by mouth 2 (two) times daily with a meal.    . metoprolol succinate (TOPROL XL) 50 MG 24 hr tablet Take 1 tablet (50 mg total) by mouth daily. 30 tablet 6  . omeprazole (PRILOSEC) 20 MG capsule Take 20 mg by mouth daily.    . simvastatin (ZOCOR) 20 MG tablet Take 20 mg by mouth every evening.    . valACYclovir (VALTREX) 500 MG tablet Take 500 mg by mouth daily.     . vitamin B-12 (CYANOCOBALAMIN) 1000 MCG tablet Take 1,000 mcg by mouth daily.     No current facility-administered medications for this  visit.    Past Medical History  Diagnosis Date  . GERD (gastroesophageal reflux disease)   . Diarrhea   . Hypertension   . Hormone replacement therapy (postmenopausal)   . Migraine   . Seizures   . IBS (irritable bowel syndrome)   . Functional ovarian cysts   . Diverticulosis of colon (without mention of hemorrhage)   . Hemorrhoids, external, without mention of complication   . Right knee DJD   . Shortness of breath   . Asthma   . Diabetes mellitus without complication   . Sleep apnea     no cpap    study 2 yrs ago    Past Surgical History  Procedure Laterality Date  . Cholecystectomy  1980  . Vaginal hysterectomy  1990  . Tonsillectomy    . Oophorectomy      2012 Dr. Adah Perl in [place]  . Tubal ligation    . Total knee arthroplasty Right 09/02/2012    Procedure: TOTAL KNEE ARTHROPLASTY- right;  Surgeon: Ninetta Lights, MD;  Location: [facility];  Service: Orthopedics;  Laterality: Right;  . Left heart catheterization with coronary angiogram N/A 11/18/2013    Procedure: LEFT HEART CATHETERIZATION WITH CORONARY ANGIOGRAM;  Surgeon: Burnell Blanks, MD;  Location: [facility] CATH LAB;  Service: Cardiovascular;  Laterality: N/A;  History   Social History  . Marital Status: Married    Spouse Name: N/A  . Number of Children: N/A  . Years of Education: N/A   Occupational History  . Not on file.   Social History Main Topics  . Smoking status: Never Smoker   . Smokeless tobacco: Never Used  . Alcohol Use: No  . Drug Use: No  . Sexual Activity: Not on file   Other Topics Concern  . Not on file   Social History Narrative     Filed Vitals:   04/15/14 0813  BP: 114/71  Pulse: 81  Height: 5' 4"  (1.626 m)  Weight: 194 lb (87.998 kg)    PHYSICAL EXAM General: NAD HEENT: Normal. Neck: No JVD, no thyromegaly. Lungs: Clear to auscultation bilaterally with normal respiratory effort. CV: Nondisplaced PMI.  Regular rate and rhythm, normal S1/S2, no S3/S4, soft 1/6  systolic murmur along left sternal border. No pretibial or periankle edema.  No carotid bruit.  Normal pedal pulses.  Abdomen: Soft, nontender, obese, no distention.  Neurologic: Alert and oriented x 3.  Psych: Normal affect. Skin: Normal. Musculoskeletal: Normal range of motion, no gross deformities. Extremities: No clubbing or cyanosis.   ECG: Most recent ECG reviewed.      ASSESSMENT AND PLAN: 1. Shortness of breath/inappropriate sinus tachycardia: Improved with Toprol-XL 50 mg daily. 2. Essential HTN: Well controlled on current therapy which includes Prinzide 20-12.5 mg daily. No changes. 3. Hyperlipidemia: Lipids on 08/23/2013 demonstrated total cholesterol 137, triglycerides 102, HDL 56, LDL 61. Continue current therapy with simvastatin 20 mg daily.  4. Chest pain: Mild nonobstructive CAD seen. Given her history of diabetes with exertional chest pain, she may have microvascular angina. I will prescribe Ranexa 500 mg bid. This did not work in the past but I will attempt to reinstitute therapy given her current symptoms. She may actually need a higher dose. I will also make a referral to Dr. Oneida Alar (GI) with respect to her history of solid food dysphagia, hiatal hernia and GERD. 5. GERD/hiatal hernia: I will also make a referral to Dr. Oneida Alar (GI) with respect to her history of solid food dysphagia, hiatal hernia and GERD. 6. Depression: On Elavil.  7. Type 2 diabetes: Currenlty on metformin and insulin.   Dispo: f/u 6 months.  Kate Sable, M.D., F.A.C.C.

## 2014-04-15 NOTE — Patient Instructions (Signed)
   Referral to Dr. Oneida Alar   Begin Ranexa 584m twice a day  - 1 week of samples provided, along with $5 co-pay card. Continue all other medications.   Your physician wants you to follow up in: 6 months.  You will receive a reminder letter in the mail one-two months in advance.  If you don't receive a letter, please call our office to schedule the follow up appointment

## 2014-04-20 ENCOUNTER — Other Ambulatory Visit: Payer: Self-pay | Admitting: *Deleted

## 2014-04-20 ENCOUNTER — Telehealth: Payer: Self-pay | Admitting: Cardiovascular Disease

## 2014-04-20 MED ORDER — RANOLAZINE ER 500 MG PO TB12: 500.0000 mg | ORAL_TABLET | Freq: Two times a day (BID) | ORAL | Status: DC

## 2014-04-20 NOTE — Telephone Encounter (Signed)
ERROR

## 2014-04-22 ENCOUNTER — Encounter: Payer: Self-pay | Admitting: Neurology

## 2014-04-22 ENCOUNTER — Ambulatory Visit (INDEPENDENT_AMBULATORY_CARE_PROVIDER_SITE_OTHER): Payer: Medicare Other | Admitting: Neurology

## 2014-04-22 VITALS — BP 110/68 | HR 93 | Ht 64.0 in | Wt 191.2 lb

## 2014-04-22 DIAGNOSIS — F329 Major depressive disorder, single episode, unspecified: Secondary | ICD-10-CM

## 2014-04-22 DIAGNOSIS — F32A Depression, unspecified: Secondary | ICD-10-CM

## 2014-04-22 DIAGNOSIS — R51 Headache: Secondary | ICD-10-CM

## 2014-04-22 DIAGNOSIS — R519 Headache, unspecified: Secondary | ICD-10-CM

## 2014-04-22 MED ORDER — AMITRIPTYLINE HCL 50 MG PO TABS: 50.0000 mg | ORAL_TABLET | Freq: Every day | ORAL | Status: DC

## 2014-04-22 NOTE — Progress Notes (Signed)
NEUROLOGY FOLLOW UP OFFICE NOTE  Carol Richmond [id number]  HISTORY OF PRESENT ILLNESS: I had the pleasure of seeing Carol Richmond in follow-up in the neurology clinic on 04/22/2014.  The patient was last seen 5 months ago for chronic daily headaches. She is again accompanied by her husband who supplements the history today. On her last visit, she had increased amitripytiline to 45m qhs. She reports that she continues to have daily headaches over the vertex/bilateral temporal regions, 4 to 6 over 10 in intensity, no associated nausea/vomiting. She takes Tylenol 2 tabs daily. The patient and her husband deny any further episodes of decreased responsiveness. She has started seeing psychiatry, and her husband has seen an improvement in her mood. The crying spells have decreased.   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 Records from her neurologist from 2009 to 2013 were reviewed. She was having episodes of abnormal smell like inhaling anesthesia, followed by breathing problems, numbness, disorientation, memory loss, difficulty finding words, and mild staggering gait. She was started on Keppra, then switched to Topamax. A 48-hour EEG in 2010 captured 2 typical spells with no associated EEG changes. Baseline EEG was normal. She started seeing a psychologist and psychiatrist, Topamax was tapered and she was started on Lamictal for mood stabilization. She was told by the psychologist that she has "absent seizures" and that "the left side of her brain is getting worse, and this could be affected by her sleep apnea." [facility] could be due to sleep attacks. She has a diagnosis of obstructive sleep apnea. It appears the spells resolved in 2011.   Since November 2014 or so, she has had episodes of feeling  off balance with speech difficulties where speech is slurred and she is unable to find appropriate words. [facility] could not keep [facility], then felt her speech was going. They were at a birthday party on 05/09/13 when it looked like she was waking up, then started "acting like a drunk person." She has been having episodes of blanking out "like she is out in space," occurring 2-3 times a week. She states these are different from the spells she had in 2009. Her husband reports that each time is worse than the other. She has been having headaches on a near daily basis. Headaches are over the vertex or bilateral temporal regions. She hears a "boom" inside her head and wants to pull her hair out. There is no associated nausea/vomiting/photo or phonophobia. She asked if these episodes can be caused by stress, reporting that she has been having more episodes since she has been taking care of her sister the past 3 months. She would "cry over nothing." She has been taking Lexapro for many years and feels that "it does not seem to be working." She takes Ativan 210mtwice a day for anxiety.   Diagnostic Data: I personally reviewed MRI brain without contrast which did not show any acute changes, there was moderate chronic white matter microvascular disease, hippocampi symmetric with no abnormal signal seen. Her 24-hour EEG was mildly abnormal due to mild diffuse slowing of the background, no epileptiform discharges or electrographic seizures seen. She did not complete diary, but tells me today that she did have an episode where she felt she had a seizure  and looked like she fell asleep.   PAST MEDICAL HISTORY: Past Medical History  Diagnosis Date  . GERD (gastroesophageal reflux disease)   . Diarrhea   . Hypertension   . Hormone replacement therapy (postmenopausal)   . Migraine   . Seizures   . IBS (irritable bowel syndrome)   . Functional ovarian cysts   .  Diverticulosis of colon (without mention of hemorrhage)   . Hemorrhoids, external, without mention of complication   . Right knee DJD   . Shortness of breath   . Asthma   . Diabetes mellitus without complication   . Sleep apnea     no cpap    study 2 yrs ago    MEDICATIONS: Current Outpatient Prescriptions on File Prior to Visit  Medication Sig Dispense Refill  . albuterol (PROVENTIL HFA;VENTOLIN HFA) 108 (90 BASE) MCG/ACT inhaler Inhale 2 puffs into the lungs every 6 (six) hours as needed for wheezing or shortness of breath.    [name] aspirin EC 81 MG tablet Take 81 mg by mouth daily.    . busPIRone (BUSPAR) 30 MG tablet Take 30 mg by mouth 2 (two) times daily.    . cholecalciferol (VITAMIN D) 1000 UNITS tablet Take 1,000 Units by mouth daily.    . Insulin Lispro, Human, (HUMALOG KWIKPEN [place]) Inject into the skin as directed.    . lamoTRIgine (LAMICTAL) 200 MG tablet Take 200 mg by mouth 2 (two) times daily.     [name] lisinopril-hydrochlorothiazide (PRINZIDE,ZESTORETIC) 20-12.5 MG per tablet Take 1 tablet by mouth daily.     . meloxicam (MOBIC) 15 MG tablet Take 15 mg by mouth daily.    . metFORMIN (GLUCOPHAGE) 1000 MG tablet Take 1,000 mg by mouth 2 (two) times daily with a meal.    . metoprolol succinate (TOPROL XL) 50 MG 24 hr tablet Take 1 tablet (50 mg total) by mouth daily. 30 tablet 6  . omeprazole (PRILOSEC) 20 MG capsule Take 20 mg by mouth daily.    . ranolazine (RANEXA) 500 MG 12 hr tablet Take 1 tablet (500 mg total) by mouth 2 (two) times daily. 60 tablet 6  . simvastatin (ZOCOR) 20 MG tablet Take 20 mg by mouth every evening.    . valACYclovir (VALTREX) 500 MG tablet Take 500 mg by mouth daily.     . vitamin B-12 (CYANOCOBALAMIN) 1000 MCG tablet Take 1,000 mcg by mouth daily.     No current facility-administered medications on file prior to visit.    ALLERGIES: No Known Allergies  FAMILY HISTORY: Family History  Problem Relation Age of Onset  . Heart disease Mother   .  Diabetes Mother   . Heart disease Brother   . Cancer Sister   . Heart disease Brother   . Heart disease Brother     SOCIAL HISTORY: History   Social History  . Marital Status: Married    Spouse Name: N/A  . Number of Children: N/A  . Years of Education: N/A   Occupational History  . Not on file.   Social History Main Topics  . Smoking status: Never Smoker   . Smokeless tobacco: Never Used  . Alcohol Use: No  . Drug Use: No  . Sexual Activity: Not on file   Other Topics Concern  . Not on file   Social History Narrative    REVIEW OF SYSTEMS: Constitutional: No fevers, chills, or sweats, no generalized fatigue, change in appetite Eyes: No visual changes, double vision, eye pain Ear, nose  and throat: No hearing loss, ear pain, nasal congestion, sore throat Cardiovascular: No chest pain, palpitations Respiratory:  No shortness of breath at rest or with exertion, wheezes GastrointestinaI: No nausea, vomiting, diarrhea, abdominal pain, fecal incontinence Genitourinary:  No dysuria, urinary retention or frequency Musculoskeletal:  No neck pain, back pain Integumentary: No rash, pruritus, skin lesions Neurological: as above Psychiatric: + depression, insomnia, anxiety Endocrine: No palpitations, fatigue, diaphoresis, mood swings, change in appetite, change in weight, increased thirst Hematologic/Lymphatic:  No anemia, purpura, petechiae. Allergic/Immunologic: no itchy/runny eyes, nasal congestion, recent allergic reactions, rashes  PHYSICAL EXAM: Filed Vitals:   04/22/14 0754  BP: 110/68  Pulse: 93   General: No acute distress Head:  Normocephalic/atraumatic Neck: supple, no paraspinal tenderness, full range of motion Heart:  Regular rate and rhythm Lungs:  Clear to auscultation bilaterally Back: No paraspinal tenderness Skin/Extremities: No rash, no edema Neurological Exam: alert and oriented to person, place, and time. No aphasia or dysarthria. Fund of knowledge  is appropriate.  Recent and remote memory are intact.  Attention and concentration are normal.    Able to name objects and repeat phrases. Cranial nerves: Pupils equal, round, reactive to light.  Fundoscopic exam unremarkable, no papilledema. Extraocular movements intact with no nystagmus. Visual fields full. Facial sensation intact. No facial asymmetry. Tongue, uvula, palate midline.  Motor: Bulk and tone normal, muscle strength 5/5 throughout with no pronator drift.  Sensation to light touch intact.  No extinction to double simultaneous stimulation.  Deep tendon reflexes 2+ throughout, toes downgoing.  Finger to nose testing intact.  Gait narrow-based and steady, able to tandem walk adequately.  Romberg negative.  IMPRESSION: This is a 64 yo RH woman with a history of diabetes, hyperlipidemia, headaches, and non-epileptic episodes of decreased responsiveness, who presented with episodes of feeling off balance with slurred speech. MRI brain and 24-hour EEG unremarkable. No further similar episodes, the etiology of these symptoms appear related to stress, and with regular psychology/psychiatry follow up, her husband has noticed an improvement. Headaches continue on a daily basis, chronic daily headaches likely with component of medication overuse, she will continue to uptitrate amitriptyline to 46m qhs. She denies any side effects on medication. We discussed rebound headaches and minimizing Tylenol intake to 2-3 a week. She will keep a calendar of her headaches and follow-up in 3-4 months.   Thank you for allowing me to participate in her care.  Please do not hesitate to call for any questions or concerns.  The duration of this appointment visit was 15 minutes of face-to-face time with the patient.  Greater than 50% of this time was spent in counseling, explanation of diagnosis, planning of further management, and coordination of care.   KEllouise Newer M.D.   CC: Dr. HSherrie Sport

## 2014-04-22 NOTE — Patient Instructions (Signed)
1. Increase amitriptyline: Take 23m tablet every night 2. Minimize Tylenol intake to 2-3 a week 3. Keep a calendar of your headaches 4. Follow-up in 3-4 months

## 2014-04-27 ENCOUNTER — Telehealth: Payer: Self-pay | Admitting: Cardiovascular Disease

## 2014-04-27 NOTE — Telephone Encounter (Signed)
ranolazine (RANEXA) 500 MG 12 hr tablet  Patient can not afford medication.  Would like to know if we can supply samples each month for her to be able to take medication.

## 2014-05-02 MED ORDER — RANOLAZINE ER 500 MG PO TB12: 500.0000 mg | ORAL_TABLET | Freq: Two times a day (BID) | ORAL | Status: DC

## 2014-05-02 NOTE — Telephone Encounter (Signed)
Discussed below with patient.  Stated she could not afford $150.00 per month.  Will have patient pick up samples for a 2 week supply.  Also, will leave [facility] application for pick up also.

## 2014-05-17 ENCOUNTER — Other Ambulatory Visit: Payer: Self-pay | Admitting: *Deleted

## 2014-05-17 MED ORDER — RANOLAZINE ER 500 MG PO TB12: 500.0000 mg | ORAL_TABLET | Freq: Two times a day (BID) | ORAL | Status: DC

## 2014-06-02 ENCOUNTER — Telehealth: Payer: Self-pay | Admitting: Cardiovascular Disease

## 2014-06-02 NOTE — Telephone Encounter (Signed)
Spoke with Mrs. Novoa today in reference to referral with Gertie Fey. She states that Dr. Sherrie Sport is referring her to Dr. Britta Mccreedy. She will call our Office if this does not work out.

## 2014-07-04 ENCOUNTER — Other Ambulatory Visit: Payer: Self-pay | Admitting: *Deleted

## 2014-07-04 MED ORDER — RANOLAZINE ER 500 MG PO TB12: 500.0000 mg | ORAL_TABLET | Freq: Two times a day (BID) | ORAL | Status: DC

## 2014-07-22 ENCOUNTER — Ambulatory Visit: Payer: Self-pay | Admitting: Neurology

## 2014-07-25 ENCOUNTER — Ambulatory Visit (INDEPENDENT_AMBULATORY_CARE_PROVIDER_SITE_OTHER): Payer: Medicare Other | Admitting: Neurology

## 2014-07-25 ENCOUNTER — Encounter: Payer: Self-pay | Admitting: Neurology

## 2014-07-25 VITALS — BP 130/68 | HR 96 | Ht 64.0 in | Wt 192.0 lb

## 2014-07-25 DIAGNOSIS — R51 Headache: Secondary | ICD-10-CM | POA: Diagnosis not present

## 2014-07-25 DIAGNOSIS — F339 Major depressive disorder, recurrent, unspecified: Secondary | ICD-10-CM | POA: Insufficient documentation

## 2014-07-25 DIAGNOSIS — R404 Transient alteration of awareness: Secondary | ICD-10-CM | POA: Diagnosis not present

## 2014-07-25 DIAGNOSIS — F32A Depression, unspecified: Secondary | ICD-10-CM

## 2014-07-25 DIAGNOSIS — F329 Major depressive disorder, single episode, unspecified: Secondary | ICD-10-CM | POA: Diagnosis not present

## 2014-07-25 DIAGNOSIS — R519 Headache, unspecified: Secondary | ICD-10-CM

## 2014-07-25 MED ORDER — AMITRIPTYLINE HCL 50 MG PO TABS: 50.0000 mg | ORAL_TABLET | Freq: Every day | ORAL | Status: DC

## 2014-07-25 NOTE — Progress Notes (Signed)
NEUROLOGY FOLLOW UP OFFICE NOTE  Carol Richmond [id number]  HISTORY OF PRESENT ILLNESS: I had the pleasure of seeing Carol Richmond in follow-up in the neurology clinic on 07/25/2014.  The patient was last seen 3 months ago for chronic daily headaches. She is again accompanied by her husband who helps supplement the history today. Since her last visit, amitriptyline dose was increased to 46m qhs. She reports that she is much better, headaches are not daily anymore, and occur infrequently. She has stopped daily Tylenol intake as well, and has only taken it once since her last visit when she had a bad headache. She denies any side effects to the medication, no daytime sedation. She and her husband deny any further episodes of decreased responsiveness or gait imbalance. She feels her mood is better, and says her children are not "at my anymore." Her husband agrees.   She denies any dizziness, diplopia, focal numbness/tingling/weakness, vision changes. No falls. Her right knee is stiff since surgery.  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 Records from her neurologist from 2009 to 2013 were reviewed. She was having episodes of abnormal smell like inhaling anesthesia, followed by breathing problems, numbness, disorientation, memory loss, difficulty finding words, and mild staggering gait. She was started on Keppra, then switched to Topamax. A 48-hour EEG in 2010 captured 2 typical spells with no associated EEG changes. Baseline EEG was normal. She started seeing a psychologist and psychiatrist, Topamax was tapered and she was started on Lamictal for mood stabilization. She was told by the psychologist that she has "absent seizures" and that "the left side of her brain is getting worse, and this could be affected by her sleep apnea." [facility] felt the episodes of unresponsiveness could be due to sleep attacks. She has a diagnosis of obstructive sleep apnea. It appears the spells resolved in 2011.   Since November 2014 or so, she has had episodes of feeling off balance with speech difficulties where speech is slurred and she is unable to find appropriate words. [facility], could not keep her balance, then felt her speech was going. They were at a birthday party on 05/09/13 when it looked like she was waking up, then started "acting like a drunk person." She has been having episodes of blanking out "like she is out in space," occurring 2-3 times a week. She states these are different from the spells she had in 2009. Her husband reports that each time is worse than the other. She has been having headaches on a near daily basis. Headaches are over the vertex or bilateral temporal regions. She hears a "boom" inside her head and wants to pull her hair out. There is no associated nausea/vomiting/photo or phonophobia. She asked if these episodes can be caused by stress, reporting that she has been having more episodes since she has been taking care of her sister the past 3 months. She would "cry over nothing." She has been taking Lexapro for many years and feels that "it does not seem to be working." She takes Ativan 256mtwice a day for anxiety.   Diagnostic Data: I personally reviewed MRI brain without contrast which did not show any acute changes, there was moderate chronic white matter microvascular disease, hippocampi symmetric with no abnormal signal seen. Her 24-hour EEG  was mildly abnormal due to mild diffuse slowing of the background, no epileptiform discharges or electrographic seizures seen. She did not complete diary, but tells me today that she did have an episode where she felt she had a seizure and looked like she fell asleep.   PAST MEDICAL HISTORY: Past Medical History  Diagnosis Date  .  GERD (gastroesophageal reflux disease)   . Diarrhea   . Hypertension   . Hormone replacement therapy (postmenopausal)   . Migraine   . Seizures   . IBS (irritable bowel syndrome)   . Functional ovarian cysts   . Diverticulosis of colon (without mention of hemorrhage)   . Hemorrhoids, external, without mention of complication   . Right knee DJD   . Shortness of breath   . Asthma   . Diabetes mellitus without complication   . Sleep apnea     no cpap    study 2 yrs ago    MEDICATIONS: Current Outpatient Prescriptions on File Prior to Visit  Medication Sig Dispense Refill  . albuterol (PROVENTIL HFA;VENTOLIN HFA) 108 (90 BASE) MCG/ACT inhaler Inhale 2 puffs into the lungs every 6 (six) hours as needed for wheezing or shortness of breath.    [name] amitriptyline (ELAVIL) 50 MG tablet Take 1 tablet (50 mg total) by mouth at bedtime. 30 tablet 6  . busPIRone (BUSPAR) 30 MG tablet Take 30 mg by mouth 2 (two) times daily.    . cholecalciferol (VITAMIN D) 1000 UNITS tablet Take 1,000 Units by mouth daily.    . Insulin Lispro, Human, (HUMALOG KWIKPEN [place]) Inject into the skin as directed.    . lamoTRIgine (LAMICTAL) 200 MG tablet Take 200 mg by mouth 2 (two) times daily.     [name] lisinopril-hydrochlorothiazide (PRINZIDE,ZESTORETIC) 20-12.5 MG per tablet Take 1 tablet by mouth daily.     . meloxicam (MOBIC) 15 MG tablet Take 15 mg by mouth daily.    . metFORMIN (GLUCOPHAGE) 1000 MG tablet Take 1,000 mg by mouth 2 (two) times daily with a meal.    . metoprolol succinate (TOPROL XL) 50 MG 24 hr tablet Take 1 tablet (50 mg total) by mouth daily. 30 tablet 6  . omeprazole (PRILOSEC) 20 MG capsule Take 20 mg by mouth daily.    . ranolazine (RANEXA) 500 MG 12 hr tablet Take 1 tablet (500 mg total) by mouth 2 (two) times daily. 84 tablet 0  . simvastatin (ZOCOR) 20 MG tablet Take 20 mg by mouth every evening.    . valACYclovir (VALTREX) 500 MG tablet Take 500 mg by mouth daily.     . vitamin B-12  (CYANOCOBALAMIN) 1000 MCG tablet Take 1,000 mcg by mouth daily.     No current facility-administered medications on file prior to visit.    ALLERGIES: No Known Allergies  FAMILY HISTORY: Family History  Problem Relation Age of Onset  . Heart disease Mother   . Diabetes Mother   . Heart disease Brother   . Cancer Sister   . Heart disease Brother   . Heart disease Brother     SOCIAL HISTORY: History   Social History  . Marital Status: Married    Spouse Name: N/A  . Number of Children: N/A  . Years of Education: N/A   Occupational History  . Not on file.   Social History Main Topics  . Smoking status: Never Smoker   . Smokeless tobacco: Never Used  . Alcohol Use: No  . Drug Use: No  . Sexual Activity: Not  on file   Other Topics Concern  . Not on file   Social History Narrative    REVIEW OF SYSTEMS: Constitutional: No fevers, chills, or sweats, no generalized fatigue, change in appetite Eyes: No visual changes, double vision, eye pain Ear, nose and throat: No hearing loss, ear pain, nasal congestion, sore throat Cardiovascular: No chest pain, palpitations Respiratory:  No shortness of breath at rest or with exertion, wheezes GastrointestinaI: No nausea, vomiting, diarrhea, abdominal pain, fecal incontinence Genitourinary:  No dysuria, urinary retention or frequency Musculoskeletal:  No neck pain, back pain Integumentary: No rash, pruritus, skin lesions Neurological: as above Psychiatric: + depression, no insomnia, + anxiety Endocrine: No palpitations, fatigue, diaphoresis, mood swings, change in appetite, change in weight, increased thirst Hematologic/Lymphatic:  No anemia, purpura, petechiae. Allergic/Immunologic: no itchy/runny eyes, nasal congestion, recent allergic reactions, rashes  PHYSICAL EXAM: Filed Vitals:   07/25/14 0741  BP: 130/68  Pulse: 96   General: No acute distress Head:  Normocephalic/atraumatic Neck: supple, no paraspinal tenderness,  full range of motion Heart:  Regular rate and rhythm Lungs:  Clear to auscultation bilaterally Back: No paraspinal tenderness Skin/Extremities: No rash, no edema Neurological Exam: alert and oriented to person, place, and time. No aphasia or dysarthria. Fund of knowledge is appropriate.  Recent and remote memory are intact.  Attention and concentration are normal.    Able to name objects and repeat phrases. Cranial nerves: Pupils equal, round, reactive to light.  Fundoscopic exam unremarkable, no papilledema. Extraocular movements intact with no nystagmus. Visual fields full. Facial sensation intact. No facial asymmetry. Tongue, uvula, palate midline.  Motor: Bulk and tone normal, muscle strength 5/5 throughout with no pronator drift.  Sensation to light touch intact.  No extinction to double simultaneous stimulation.  Deep tendon reflexes 2+ throughout, toes downgoing.  Finger to nose testing intact.  Gait slow and cautious due to right knee pain, causing difficulty with tandem walk. Romberg negative.  IMPRESSION: This is a 64 yo RH woman with a history of diabetes, hyperlipidemia, headaches, and non-epileptic episodes of decreased responsiveness, who presented with episodes of feeling off balance with slurred speech. MRI brain and 24-hour EEG unremarkable. No further similar episodes, the etiology of these symptoms appear related to stress. She was reporting daily headaches on her last visit, with significant improvement on amitriptyline 26m qhs. Headaches now occur infrequently. Continue current dose amitriptyline. She knows to minimize Tylenol intake to 2-3 a week to avoid rebound headaches. She is aware of [place] driving laws that stipulate that after an episode of loss of awareness, one should stop driving until 6 months event-free. She knows to call our office for any changes and will follow-up in 6 months.   Thank you for allowing me to participate in her care.  Please do not hesitate to call for any  questions or concerns.  The duration of this appointment visit was 15 minutes of face-to-face time with the patient.  Greater than 50% of this time was spent in counseling, explanation of diagnosis, planning of further management, and coordination of care.   KEllouise Newer M.D.   CC: Dr. HSherrie Sport

## 2014-07-25 NOTE — Patient Instructions (Signed)
1. Continue amitriptyline 34m at bedtime 2. Minimize Tylenol intake to 2 a week to avoid rebound headaches 3. As per [place] driving laws, for any episode of loss of consciousness, stop driving until 6 months event-free 4. Follow-up in 6 months, call our office for any changes

## 2014-08-15 ENCOUNTER — Telehealth: Payer: Self-pay | Admitting: Cardiovascular Disease

## 2014-08-16 NOTE — Telephone Encounter (Signed)
Pt given samples and another application for [facility]. Gave pt 4 boxes and pt says she has filled out application and that Dr. Court Joy regular nurse was aware of this. Will forward to nurse as Juluis Rainier. Samples ready for pickup

## 2014-08-17 NOTE — Telephone Encounter (Signed)
Call placed to patient.  Explained to her her that application given was for [employer] Patient Assistance Program.  Patient verbalized understanding.

## 2014-10-11 ENCOUNTER — Ambulatory Visit (INDEPENDENT_AMBULATORY_CARE_PROVIDER_SITE_OTHER): Payer: Medicare Other | Admitting: Cardiovascular Disease

## 2014-10-11 ENCOUNTER — Encounter: Payer: Self-pay | Admitting: Cardiovascular Disease

## 2014-10-11 VITALS — BP 118/70 | HR 89 | Ht 64.0 in | Wt 192.0 lb

## 2014-10-11 DIAGNOSIS — R079 Chest pain, unspecified: Secondary | ICD-10-CM | POA: Diagnosis not present

## 2014-10-11 DIAGNOSIS — E785 Hyperlipidemia, unspecified: Secondary | ICD-10-CM

## 2014-10-11 DIAGNOSIS — I10 Essential (primary) hypertension: Secondary | ICD-10-CM | POA: Diagnosis not present

## 2014-10-11 DIAGNOSIS — I471 Supraventricular tachycardia: Secondary | ICD-10-CM

## 2014-10-11 DIAGNOSIS — R0602 Shortness of breath: Secondary | ICD-10-CM | POA: Diagnosis not present

## 2014-10-11 DIAGNOSIS — R000 Tachycardia, unspecified: Secondary | ICD-10-CM

## 2014-10-11 MED ORDER — ALBUTEROL SULFATE HFA 108 (90 BASE) MCG/ACT IN AERS: 2.0000 | INHALATION_SPRAY | Freq: Four times a day (QID) | RESPIRATORY_TRACT | Status: DC | PRN

## 2014-10-11 NOTE — Patient Instructions (Signed)
Your physician wants you to follow-up in: [place]. You will receive a reminder letter in the mail two months in advance. If you don't receive a letter, please call our office to schedule the follow-up appointment.  Your physician recommends that you continue on your current medications as directed. Please refer to the Current Medication list given to you today.  I REFILLED YOUR INHALER @ YOUR PHARMACY  Thanks for choosing [facility]!!!

## 2014-10-11 NOTE — Progress Notes (Signed)
[name], female   DOB: 1950/05/15, 64 y.o.   MRN: [id number]      SUBJECTIVE: The patient presents for follow-up of exertional chest pain and shortness of breath. Ranexa did not seem to alleviate her symptoms. Albuterol does appear to alleviate her symptoms. Coronary angiography on 11/18/13 showed mild nonobstructive coronary artery disease. She reportedly saw a gastroenterologist and a pulmonologist since her last visit with me and no abnormalities were found.   Review of Systems: As per "subjective", otherwise negative.  No Known Allergies  Current Outpatient Prescriptions  Medication Sig Dispense Refill  . acyclovir (ZOVIRAX) 800 MG tablet Take 800 mg by mouth 5 (five) times daily.    [name] albuterol (PROVENTIL HFA;VENTOLIN HFA) 108 (90 BASE) MCG/ACT inhaler Inhale 2 puffs into the lungs every 6 (six) hours as needed for wheezing or shortness of breath.    [name] amitriptyline (ELAVIL) 50 MG tablet Take 1 tablet (50 mg total) by mouth at bedtime. 30 tablet 6  . busPIRone (BUSPAR) 30 MG tablet Take 30 mg by mouth 2 (two) times daily.    . cholecalciferol (VITAMIN D) 1000 UNITS tablet Take 1,000 Units by mouth daily.    . Insulin Lispro, Human, (HUMALOG KWIKPEN [place]) Inject into the skin as directed.    . lamoTRIgine (LAMICTAL) 200 MG tablet Take 200 mg by mouth 2 (two) times daily.     [name] lisinopril-hydrochlorothiazide (PRINZIDE,ZESTORETIC) 20-12.5 MG per tablet Take 1 tablet by mouth daily.     . meloxicam (MOBIC) 15 MG tablet Take 15 mg by mouth daily.    . metFORMIN (GLUCOPHAGE) 1000 MG tablet Take 1,000 mg by mouth 2 (two) times daily with a meal.    . metoprolol succinate (TOPROL XL) 50 MG 24 hr tablet Take 1 tablet (50 mg total) by mouth daily. 30 tablet 6  . omeprazole (PRILOSEC) 20 MG capsule Take 20 mg by mouth daily.    . ranolazine (RANEXA) 500 MG 12 hr tablet Take 1 tablet (500 mg total) by mouth 2 (two) times daily. 84 tablet 0  . simvastatin (ZOCOR) 20 MG tablet  Take 20 mg by mouth every evening.    . valACYclovir (VALTREX) 500 MG tablet Take 500 mg by mouth daily.     [name] venlafaxine (EFFEXOR) 75 MG tablet Take 75 mg by mouth 2 (two) times daily.    . vitamin B-12 (CYANOCOBALAMIN) 1000 MCG tablet Take 1,000 mcg by mouth daily.     No current facility-administered medications for this visit.    Past Medical History  Diagnosis Date  . GERD (gastroesophageal reflux disease)   . Diarrhea   . Hypertension   . Hormone replacement therapy (postmenopausal)   . Migraine   . Seizures   . IBS (irritable bowel syndrome)   . Functional ovarian cysts   . Diverticulosis of colon (without mention of hemorrhage)   . Hemorrhoids, external, without mention of complication   . Right knee DJD   . Shortness of breath   . Asthma   . Diabetes mellitus without complication   . Sleep apnea     no cpap    study 2 yrs ago    Past Surgical History  Procedure Laterality Date  . Cholecystectomy  1980  . Vaginal hysterectomy  1990  . Tonsillectomy    . Oophorectomy      2012 Dr. Adah Perl in [place]  . Tubal ligation    . Total knee arthroplasty Right 09/02/2012    Procedure: TOTAL KNEE  ARTHROPLASTY- right;  Surgeon: Ninetta Lights, MD;  Location: [facility];  Service: Orthopedics;  Laterality: Right;  . Left heart catheterization with coronary angiogram N/A 11/18/2013    Procedure: LEFT HEART CATHETERIZATION WITH CORONARY ANGIOGRAM;  Surgeon: Burnell Blanks, MD;  Location: [facility] LAB;  Service: Cardiovascular;  Laterality: N/A;    Social History   Social History  . Marital Status: Married    Spouse Name: N/A  . Number of Children: N/A  . Years of Education: N/A   Occupational History  . Not on file.   Social History Main Topics  . Smoking status: Never Smoker   . Smokeless tobacco: Never Used  . Alcohol Use: No  . Drug Use: No  . Sexual Activity: Not on file   Other Topics Concern  . Not on file   Social History Narrative     Filed Vitals:     10/11/14 0921  BP: 118/70  Pulse: 89  Height: 5' 4"  (1.626 m)  Weight: 192 lb (87.091 kg)  SpO2: 93%    PHYSICAL EXAM General: NAD HEENT: Normal. Neck: No JVD, no thyromegaly. Lungs: Clear to auscultation bilaterally with normal respiratory effort. CV: Nondisplaced PMI. Regular rate and rhythm, normal S1/S2, no M0/H6, soft 1/6 systolic murmur along left sternal border. No pretibial or periankle edema. No carotid bruit. Normal pedal pulses.  Abdomen: Soft, nontender, obese, no distention.  Neurologic: Alert and oriented x 3.  Psych: Normal affect. Skin: Normal. Musculoskeletal: Normal range of motion, no gross deformities. Extremities: No clubbing or cyanosis.   ECG: Most recent ECG reviewed.      ASSESSMENT AND PLAN: 1. Shortness of breath/inappropriate sinus tachycardia: Improved with Toprol-XL 50 mg daily. Will refill albuterol inhaler as this also appears to help.  2. Essential HTN: Well controlled on current therapy which includes Prinzide 20-12.5 mg daily. No changes.  3. Hyperlipidemia: Lipids on 08/23/2013 demonstrated total cholesterol 137, triglycerides 102, HDL 56, LDL 61. Continue current therapy with simvastatin 20 mg daily. Will check to see if lipids were performed by PCP.  4. Chest pain: Mild nonobstructive CAD seen on 11/18/13. Given her history of diabetes with exertional chest pain, she may have microvascular angina. However, Ranexa did not help.    Dispo: f/u 1 year.  Kate Sable, M.D., F.A.C.C.

## 2014-10-12 ENCOUNTER — Ambulatory Visit: Payer: Self-pay | Admitting: Cardiovascular Disease

## 2015-01-24 ENCOUNTER — Ambulatory Visit: Payer: Self-pay | Admitting: Neurology

## 2015-01-27 ENCOUNTER — Encounter: Payer: Self-pay | Admitting: Neurology

## 2015-01-27 ENCOUNTER — Ambulatory Visit (INDEPENDENT_AMBULATORY_CARE_PROVIDER_SITE_OTHER): Payer: Medicare Other | Admitting: Neurology

## 2015-01-27 VITALS — BP 104/60 | HR 88 | Ht 64.0 in | Wt 191.0 lb

## 2015-01-27 DIAGNOSIS — R519 Headache, unspecified: Secondary | ICD-10-CM

## 2015-01-27 DIAGNOSIS — F329 Major depressive disorder, single episode, unspecified: Secondary | ICD-10-CM | POA: Diagnosis not present

## 2015-01-27 DIAGNOSIS — F32A Depression, unspecified: Secondary | ICD-10-CM

## 2015-01-27 DIAGNOSIS — G43009 Migraine without aura, not intractable, without status migrainosus: Secondary | ICD-10-CM | POA: Insufficient documentation

## 2015-01-27 DIAGNOSIS — R51 Headache: Secondary | ICD-10-CM | POA: Diagnosis not present

## 2015-01-27 DIAGNOSIS — R404 Transient alteration of awareness: Secondary | ICD-10-CM

## 2015-01-27 MED ORDER — AMITRIPTYLINE HCL 50 MG PO TABS: 50.0000 mg | ORAL_TABLET | Freq: Every day | ORAL | Status: DC

## 2015-01-27 MED ORDER — TRAMADOL HCL 50 MG PO TABS: ORAL_TABLET | ORAL | Status: DC

## 2015-01-27 NOTE — Progress Notes (Signed)
NEUROLOGY FOLLOW UP OFFICE NOTE  Carol Richmond [id number]  HISTORY OF PRESENT ILLNESS: I had the pleasure of seeing Carol Richmond in follow-up in the neurology clinic on 01/27/2015.  The patient was last seen 6 months ago for chronic daily headaches. She is again accompanied by her husband who helps supplement the history today. She continues to have good response to amitriptyline, reporting only one headache in the past 6 months, she had a severe throbbing headache last week that lasted a few hours. She took Tylenol. No associated nausea/vomiting. She is concerned about episodes of speech difficulty, where she would have problems getting her words out, stops briefly, then is able to speak fine after. She notices it daily, more when anxious/stressed. She spoke to her PCP about depression, and is currently taking the immediate-release and extended-release froms of Effexor at the same time. She reports the medication is not helping her as much as her previous more expensive medication, but does not want to see a psychiatrist at this time. She denies any dizziness, diplopia, focal numbness/tingling/weakness, vision changes. No falls.  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 Records from her neurologist from 2009 to 2013 were reviewed. She was having episodes of abnormal smell like inhaling anesthesia, followed by breathing problems, numbness, disorientation, memory loss, difficulty finding words, and mild staggering gait. She was started on Keppra, then switched to Topamax. A 48-hour EEG in 2010 captured 2 typical spells with no associated EEG changes. Baseline EEG was normal. She started seeing a psychologist and psychiatrist, Topamax was tapered and she was started on Lamictal for mood stabilization. She was told by the psychologist that she has "absent seizures" and that "the left side  of her brain is getting worse, and this could be affected by her sleep apnea." She apparently had been seen by a neurologist at [facility] who felt the episodes of unresponsiveness could be due to sleep attacks. She has a diagnosis of obstructive sleep apnea. It appears the spells resolved in 2011.   Since November 2014 or so, she has had episodes of feeling off balance with speech difficulties where speech is slurred and she is unable to find appropriate words. They were standing at [facility] then she started rocking, could not keep her balance, then felt her speech was going. They were at a birthday party on 05/09/13 when it looked like she was waking up, then started "acting like a drunk person." She has been having episodes of blanking out "like she is out in space," occurring 2-3 times a week. She states these are different from the spells she had in 2009. Her husband reports that each time is worse than the other. She has been having headaches on a near daily basis. Headaches are over the vertex or bilateral temporal regions. She hears a "boom" inside her head and wants to pull her hair out. There is no associated nausea/vomiting/photo or phonophobia. She asked if these episodes can be caused by stress, reporting that she has been having more episodes since she has been taking care of her sister the past 3 months. She would "cry over nothing." She has been taking Lexapro for many years and feels that "it does not seem to be working." She takes Ativan 62m twice a day for anxiety.   Diagnostic Data: I personally reviewed MRI brain without contrast which did not show any acute changes, there was  moderate chronic white matter microvascular disease, hippocampi symmetric with no abnormal signal seen. Her 24-hour EEG was mildly abnormal due to mild diffuse slowing of the background, no epileptiform discharges or electrographic seizures seen. She did not complete diary, but tells me today that she did have an episode  where she felt she had a seizure and looked like she fell asleep.   PAST MEDICAL HISTORY: Past Medical History  Diagnosis Date  . GERD (gastroesophageal reflux disease)   . Diarrhea   . Hypertension   . Hormone replacement therapy (postmenopausal)   . Migraine   . Seizures ([place])   . IBS (irritable bowel syndrome)   . Functional ovarian cysts   . Diverticulosis of colon (without mention of hemorrhage)   . Hemorrhoids, external, without mention of complication   . Right knee DJD   . Shortness of breath   . Asthma   . Diabetes mellitus without complication ([place])   . Sleep apnea     no cpap    study 2 yrs ago    MEDICATIONS: Current Outpatient Prescriptions on File Prior to Visit  Medication Sig Dispense Refill  . acyclovir (ZOVIRAX) 800 MG tablet Take 800 mg by mouth daily.     [name] albuterol (PROVENTIL HFA;VENTOLIN HFA) 108 (90 BASE) MCG/ACT inhaler Inhale 2 puffs into the lungs every 6 (six) hours as needed for wheezing or shortness of breath. 1 Inhaler 3  . cholecalciferol (VITAMIN D) 1000 UNITS tablet Take 1,000 Units by mouth daily.    . Insulin Lispro, Human, (HUMALOG KWIKPEN [place]) Inject 5 Units into the skin 3 (three) times daily.     [name] lamoTRIgine (LAMICTAL) 200 MG tablet Take 200 mg by mouth 2 (two) times daily.     [name] lisinopril-hydrochlorothiazide (PRINZIDE,ZESTORETIC) 20-12.5 MG per tablet Take 1 tablet by mouth daily.     . metFORMIN (GLUCOPHAGE) 1000 MG tablet Take 1,000 mg by mouth 2 (two) times daily with a meal.    . metoprolol succinate (TOPROL XL) 50 MG 24 hr tablet Take 1 tablet (50 mg total) by mouth daily. 30 tablet 6  . omeprazole (PRILOSEC) 20 MG capsule Take 20 mg by mouth daily.    . simvastatin (ZOCOR) 20 MG tablet Take 20 mg by mouth every evening.    . venlafaxine (EFFEXOR) 75 MG tablet Take 75 mg by mouth 2 (two) times daily.    Amitriptyline 33m qhs Buspar 177m2 tabs BID Venlafaxine XR 15030maily with breakfast No current facility-administered  medications on file prior to visit.    ALLERGIES: No Known Allergies  FAMILY HISTORY: Family History  Problem Relation Age of Onset  . Heart disease Mother   . Diabetes Mother   . Heart disease Brother   . Cancer Sister   . Heart disease Brother   . Heart disease Brother     SOCIAL HISTORY: Social History   Social History  . Marital Status: Married    Spouse Name: N/A  . Number of Children: N/A  . Years of Education: N/A   Occupational History  . Not on file.   Social History Main Topics  . Smoking status: Never Smoker   . Smokeless tobacco: Never Used  . Alcohol Use: No  . Drug Use: No  . Sexual Activity: Not on file   Other Topics Concern  . Not on file   Social History Narrative    REVIEW OF SYSTEMS: Constitutional: No fevers, chills, or sweats, no generalized fatigue, change in appetite Eyes: No  visual changes, double vision, eye pain Ear, nose and throat: No hearing loss, ear pain, nasal congestion, sore throat Cardiovascular: No chest pain, palpitations Respiratory:  No shortness of breath at rest or with exertion, wheezes GastrointestinaI: No nausea, vomiting, diarrhea, abdominal pain, fecal incontinence Genitourinary:  No dysuria, urinary retention or frequency Musculoskeletal:  No neck pain, back pain Integumentary: No rash, pruritus, skin lesions Neurological: as above Psychiatric: + depression, no insomnia, + anxiety Endocrine: No palpitations, fatigue, diaphoresis, mood swings, change in appetite, change in weight, increased thirst Hematologic/Lymphatic:  No anemia, purpura, petechiae. Allergic/Immunologic: no itchy/runny eyes, nasal congestion, recent allergic reactions, rashes  PHYSICAL EXAM: Filed Vitals:   01/27/15 0810  BP: 104/60  Pulse: 88   General: No acute distress Head:  Normocephalic/atraumatic Neck: supple, no paraspinal tenderness, full range of motion Heart:  Regular rate and rhythm Lungs:  Clear to auscultation  bilaterally Back: No paraspinal tenderness Skin/Extremities: No rash, no edema Neurological Exam: alert and oriented to person, place, and time. No aphasia or dysarthria. Fund of knowledge is appropriate.  Recent and remote memory are intact.  Attention and concentration are normal.    Able to name objects and repeat phrases. Cranial nerves: Pupils equal, round, reactive to light.  Fundoscopic exam unremarkable, no papilledema. Extraocular movements intact with no nystagmus. Visual fields full. Facial sensation intact. No facial asymmetry. Tongue, uvula, palate midline.  Motor: Bulk and tone normal, muscle strength 5/5 throughout with no pronator drift.  Sensation to light touch intact.  No extinction to double simultaneous stimulation.  Deep tendon reflexes 2+ throughout, toes downgoing.  Finger to nose testing intact.  Gait slow and cautious due to right knee pain, causing difficulty with tandem walk. Romberg negative.  IMPRESSION: This is a 64 yo RH woman with a history of diabetes, hyperlipidemia, headaches, and non-epileptic episodes of decreased responsiveness, who presented with episodes of feeling off balance with slurred speech. MRI brain and 24-hour EEG unremarkable. No further episodes of decreased responsiveness. She reports speech difficulties daily, more with stress/anxiety. She reports taking both Effexor 65m BID and Effexor XR 1580mdaily, and was instructed to speak to her PCP if she should be taking both at the same time. She continues to have good response to amitriptyline 5064mhs, with only 1 throbbing headache in the past 6 months. Continue current dose, this may help with mood as well. She endorses both depression and anxiety. Psychiatry consult was offered but she decided to hold off for now. She was given a prescription for prn Tramadol to take only for severe headaches. She knows to minimize Tylenol intake to 2-3 a week to avoid rebound headaches. She will follow-up in 6 months and  knows to call for any changes.   Thank you for allowing me to participate in her care.  Please do not hesitate to call for any questions or concerns.  The duration of this appointment visit was 25 minutes of face-to-face time with the patient.  Greater than 50% of this time was spent in counseling, explanation of diagnosis, planning of further management, and coordination of care.   KarEllouise Newer.D.   CC: Dr. HasSherrie Sport

## 2015-01-27 NOTE — Patient Instructions (Signed)
1. Continue amitriptyline 59m at bedtime 2. May take Tramadol 585mas needed for severe headaches. Do not take more than 2 a week. 3. Discuss your medications with PCP 4. Follow-up in 6 months

## 2015-02-20 DIAGNOSIS — E119 Type 2 diabetes mellitus without complications: Secondary | ICD-10-CM | POA: Diagnosis not present

## 2015-02-20 DIAGNOSIS — I10 Essential (primary) hypertension: Secondary | ICD-10-CM | POA: Diagnosis not present

## 2015-02-20 DIAGNOSIS — H671 Otitis media in diseases classified elsewhere, right ear: Secondary | ICD-10-CM | POA: Diagnosis not present

## 2015-02-20 DIAGNOSIS — Z6832 Body mass index (BMI) 32.0-32.9, adult: Secondary | ICD-10-CM | POA: Diagnosis not present

## 2015-03-10 DIAGNOSIS — E1142 Type 2 diabetes mellitus with diabetic polyneuropathy: Secondary | ICD-10-CM | POA: Diagnosis not present

## 2015-03-10 DIAGNOSIS — I10 Essential (primary) hypertension: Secondary | ICD-10-CM | POA: Diagnosis not present

## 2015-03-10 DIAGNOSIS — Z6832 Body mass index (BMI) 32.0-32.9, adult: Secondary | ICD-10-CM | POA: Diagnosis not present

## 2015-04-05 ENCOUNTER — Encounter: Payer: Self-pay | Admitting: Neurology

## 2015-04-18 DIAGNOSIS — Z6831 Body mass index (BMI) 31.0-31.9, adult: Secondary | ICD-10-CM | POA: Diagnosis not present

## 2015-04-18 DIAGNOSIS — J018 Other acute sinusitis: Secondary | ICD-10-CM | POA: Diagnosis not present

## 2015-04-26 DIAGNOSIS — Z7984 Long term (current) use of oral hypoglycemic drugs: Secondary | ICD-10-CM | POA: Diagnosis not present

## 2015-04-26 DIAGNOSIS — J45909 Unspecified asthma, uncomplicated: Secondary | ICD-10-CM | POA: Diagnosis not present

## 2015-04-26 DIAGNOSIS — I10 Essential (primary) hypertension: Secondary | ICD-10-CM | POA: Diagnosis not present

## 2015-04-26 DIAGNOSIS — E871 Hypo-osmolality and hyponatremia: Secondary | ICD-10-CM | POA: Diagnosis not present

## 2015-04-26 DIAGNOSIS — Z794 Long term (current) use of insulin: Secondary | ICD-10-CM | POA: Diagnosis not present

## 2015-04-26 DIAGNOSIS — R0602 Shortness of breath: Secondary | ICD-10-CM | POA: Diagnosis not present

## 2015-04-26 DIAGNOSIS — J1089 Influenza due to other identified influenza virus with other manifestations: Secondary | ICD-10-CM | POA: Diagnosis not present

## 2015-04-26 DIAGNOSIS — K449 Diaphragmatic hernia without obstruction or gangrene: Secondary | ICD-10-CM | POA: Diagnosis not present

## 2015-04-26 DIAGNOSIS — J101 Influenza due to other identified influenza virus with other respiratory manifestations: Secondary | ICD-10-CM | POA: Diagnosis not present

## 2015-04-26 DIAGNOSIS — K219 Gastro-esophageal reflux disease without esophagitis: Secondary | ICD-10-CM | POA: Diagnosis not present

## 2015-04-26 DIAGNOSIS — Z79899 Other long term (current) drug therapy: Secondary | ICD-10-CM | POA: Diagnosis not present

## 2015-04-26 DIAGNOSIS — E119 Type 2 diabetes mellitus without complications: Secondary | ICD-10-CM | POA: Diagnosis not present

## 2015-04-26 DIAGNOSIS — R1013 Epigastric pain: Secondary | ICD-10-CM | POA: Diagnosis not present

## 2015-06-16 DIAGNOSIS — I10 Essential (primary) hypertension: Secondary | ICD-10-CM | POA: Diagnosis not present

## 2015-06-16 DIAGNOSIS — E119 Type 2 diabetes mellitus without complications: Secondary | ICD-10-CM | POA: Diagnosis not present

## 2015-06-16 DIAGNOSIS — Z6830 Body mass index (BMI) 30.0-30.9, adult: Secondary | ICD-10-CM | POA: Diagnosis not present

## 2015-07-28 ENCOUNTER — Ambulatory Visit: Payer: Self-pay | Admitting: Neurology

## 2015-07-31 ENCOUNTER — Ambulatory Visit: Payer: Self-pay | Admitting: Neurology

## 2015-08-14 DIAGNOSIS — Z794 Long term (current) use of insulin: Secondary | ICD-10-CM | POA: Diagnosis not present

## 2015-08-14 DIAGNOSIS — E1142 Type 2 diabetes mellitus with diabetic polyneuropathy: Secondary | ICD-10-CM | POA: Diagnosis not present

## 2015-08-14 DIAGNOSIS — K219 Gastro-esophageal reflux disease without esophagitis: Secondary | ICD-10-CM | POA: Diagnosis not present

## 2015-08-14 DIAGNOSIS — Z6831 Body mass index (BMI) 31.0-31.9, adult: Secondary | ICD-10-CM | POA: Diagnosis not present

## 2015-08-14 DIAGNOSIS — I10 Essential (primary) hypertension: Secondary | ICD-10-CM | POA: Diagnosis not present

## 2015-10-30 ENCOUNTER — Encounter: Payer: Self-pay | Admitting: Cardiovascular Disease

## 2015-10-30 ENCOUNTER — Ambulatory Visit (INDEPENDENT_AMBULATORY_CARE_PROVIDER_SITE_OTHER): Payer: PPO | Admitting: Cardiovascular Disease

## 2015-10-30 VITALS — BP 112/66 | HR 73 | Ht 64.0 in | Wt 188.0 lb

## 2015-10-30 DIAGNOSIS — R000 Tachycardia, unspecified: Secondary | ICD-10-CM | POA: Diagnosis not present

## 2015-10-30 DIAGNOSIS — R0602 Shortness of breath: Secondary | ICD-10-CM

## 2015-10-30 DIAGNOSIS — I10 Essential (primary) hypertension: Secondary | ICD-10-CM

## 2015-10-30 DIAGNOSIS — E785 Hyperlipidemia, unspecified: Secondary | ICD-10-CM

## 2015-10-30 NOTE — Progress Notes (Signed)
SUBJECTIVE: Pt returns for routine follow up. Albuterol did appear to alleviate her symptoms but she never got it filled. Coronary angiography on 11/18/13 showed mild nonobstructive coronary artery disease. Still has exertional dyspnea.   Review of Systems: As per "subjective", otherwise negative.  No Known Allergies  Current Outpatient Prescriptions  Medication Sig Dispense Refill  . acyclovir (ZOVIRAX) 800 MG tablet Take 800 mg by mouth daily.     [name] albuterol (PROVENTIL HFA;VENTOLIN HFA) 108 (90 BASE) MCG/ACT inhaler Inhale 2 puffs into the lungs every 6 (six) hours as needed for wheezing or shortness of breath. 1 Inhaler 3  . amitriptyline (ELAVIL) 50 MG tablet Take 1 tablet (50 mg total) by mouth at bedtime. 30 tablet 11  . Black Cohosh 40 MG CAPS Take by mouth daily.    . busPIRone (BUSPAR) 15 MG tablet Take 2 tablets by mouth twice daily    . cholecalciferol (VITAMIN D) 1000 UNITS tablet Take 1,000 Units by mouth daily.    . Insulin Lispro, Human, (HUMALOG KWIKPEN [place]) Inject 5 Units into the skin 3 (three) times daily.     [name] lamoTRIgine (LAMICTAL) 200 MG tablet Take 200 mg by mouth 2 (two) times daily.     [name] lisinopril-hydrochlorothiazide (PRINZIDE,ZESTORETIC) 20-12.5 MG per tablet Take 1 tablet by mouth daily.     . meloxicam (MOBIC) 7.5 MG tablet Take 7.5 mg by mouth daily.    . metFORMIN (GLUCOPHAGE) 1000 MG tablet Take 1,000 mg by mouth 2 (two) times daily with a meal.    . metoprolol succinate (TOPROL XL) 50 MG 24 hr tablet Take 1 tablet (50 mg total) by mouth daily. 30 tablet 6  . omeprazole (PRILOSEC) 20 MG capsule Take 20 mg by mouth daily.    . simvastatin (ZOCOR) 20 MG tablet Take 20 mg by mouth every evening.    . traMADol (ULTRAM) 50 MG tablet Take 1 tablet as needed for severe headaches. Do not take more than 2 a week 10 tablet 5  . venlafaxine (EFFEXOR) 75 MG tablet Take 75 mg by mouth 2 (two) times daily.    [name] venlafaxine XR (EFFEXOR-XR) 150 MG 24 hr capsule  Take 150 mg by mouth daily with breakfast.     No current facility-administered medications for this visit.     Past Medical History:  Diagnosis Date  . Asthma   . Diabetes mellitus without complication ([place])   . Diarrhea   . Diverticulosis of colon (without mention of hemorrhage)   . Functional ovarian cysts   . GERD (gastroesophageal reflux disease)   . Hemorrhoids, external, without mention of complication   . Hormone replacement therapy (postmenopausal)   . Hypertension   . IBS (irritable bowel syndrome)   . Migraine   . Right knee DJD   . Seizures ([place])   . Shortness of breath   . Sleep apnea    no cpap    study 2 yrs ago    Past Surgical History:  Procedure Laterality Date  . CHOLECYSTECTOMY  1980  . LEFT HEART CATHETERIZATION WITH CORONARY ANGIOGRAM N/A 11/18/2013   Procedure: LEFT HEART CATHETERIZATION WITH CORONARY ANGIOGRAM;  Surgeon: Burnell Blanks, MD;  Location[facility] CATH LAB;  Service: Cardiovascular;  Laterality: N/A;  . OOPHORECTOMY     2012 Dr. Adah Perl in [place]  . TONSILLECTOMY    . TOTAL KNEE ARTHROPLASTY Right 09/02/2012   Procedure: TOTAL KNEE ARTHROPLASTY- right;  Surgeon: Ninetta Lights, MD;  Location: [facility];  Service: Orthopedics;  Laterality: Right;  . TUBAL LIGATION    . VAGINAL HYSTERECTOMY  1990    Social History   Social History  . Marital status: Married    Spouse name: N/A  . Number of children: N/A  . Years of education: N/A   Occupational History  . Not on file.   Social History Main Topics  . Smoking status: Never Smoker  . Smokeless tobacco: Never Used  . Alcohol use No  . Drug use: No  . Sexual activity: Not on file   Other Topics Concern  . Not on file   Social History Narrative  . No narrative on file     Vitals:   10/30/15 0845  BP: 112/66  Pulse: 73  SpO2: 96%  Weight: 188 lb (85.3 kg)  Height: 5' 4"  (1.626 m)    PHYSICAL EXAM General: NAD HEENT: Normal. Neck: No JVD, no thyromegaly. Lungs: Clear  to auscultation bilaterally with normal respiratory effort. CV: Nondisplaced PMI. Regular rate and rhythm, normal S1/S2, no X4/V8, soft 1/6 systolic murmur along left sternal border. No pretibial or periankle edema. Normal pedal pulses.  Abdomen: Soft, nontender, obese, no distention.  Neurologic: Alert and oriented x 3.  Psych: Normal affect. Skin: Normal. Musculoskeletal: Normal range of motion, no gross deformities. Extremities: No clubbing or cyanosis.     ECG: Most recent ECG reviewed.      ASSESSMENT AND PLAN: 1. Shortness of breath/inappropriate sinus tachycardia: Improved with Toprol-XL 50 mg daily. Needs to speak with PCP about albuterol and possible pulmonary processes.  2. Essential HTN: Well controlled on current therapy which includes Prinzide 20-12.5 mg daily. No changes.  3. Hyperlipidemia: Continue statin.  4. Chest pain: Mild nonobstructive CAD seen on 11/18/13. Given her history of diabetes with exertional chest pain, she may have microvascular angina. However, Ranexa did not help.    Dispo: f/u 1 year.   Kate Sable, M.D., F.A.C.C.

## 2015-10-30 NOTE — Patient Instructions (Signed)
Your physician wants you to follow-up in: 1 year Dr Virgina Jock will receive a reminder letter in the mail two months in advance. If you don't receive a letter, please call our office to schedule the follow-up appointment.   Your physician recommends that you continue on your current medications as directed. Please refer to the Current Medication list given to you today.    If you need a refill on your cardiac medications before your next appointment, please call your pharmacy.    Thank you for choosing [facility] !

## 2015-12-01 ENCOUNTER — Encounter: Payer: Self-pay | Admitting: Neurology

## 2015-12-01 ENCOUNTER — Ambulatory Visit (INDEPENDENT_AMBULATORY_CARE_PROVIDER_SITE_OTHER): Payer: PPO | Admitting: Neurology

## 2015-12-01 VITALS — BP 116/70 | HR 82 | Temp 98.3°F | Ht 64.0 in | Wt 189.0 lb

## 2015-12-01 DIAGNOSIS — G43009 Migraine without aura, not intractable, without status migrainosus: Secondary | ICD-10-CM | POA: Diagnosis not present

## 2015-12-01 MED ORDER — AMITRIPTYLINE HCL 50 MG PO TABS: 50.0000 mg | ORAL_TABLET | Freq: Every day | ORAL | 3 refills | Status: DC

## 2015-12-01 NOTE — Progress Notes (Signed)
NEUROLOGY FOLLOW UP OFFICE NOTE  Carol Richmond [id number]  HISTORY OF PRESENT ILLNESS: I had the pleasure of seeing Carol Richmond in follow-up in the neurology clinic on 12/01/2015.  The patient was last seen 10 months ago for chronic daily headaches. She is again accompanied by her husband who helps supplement the history today. She reports headaches are not as bad, they occur around once a week lasting a few minutes, more when she gets poor sleep the night prior. She is taking amitriptyline 23m qhs with no side effects. She again reports occasional speech difficulties where she would have to start all over again and forget what she was saying. She reports anxiety is "okay." She denies getting lost driving, no missed bills or medications. No further episodes of loss of consciousness. She occasionally feels that her left leg gives way, sometimes preceded by a funny feeling in her chest, no associated confusion. She feels she has low energy. She denies any dizziness, diplopia, focal numbness/tingling/weakness, vision changes. No falls.  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 Records from her neurologist from 2009 to 2013 were reviewed. She was having episodes of abnormal smell like inhaling anesthesia, followed by breathing problems, numbness, disorientation, memory loss, difficulty finding words, and mild staggering gait. She was started on Keppra, then switched to Topamax. A 48-hour EEG in 2010 captured 2 typical spells with no associated EEG changes. Baseline EEG was normal. She started seeing a psychologist and psychiatrist, Topamax was tapered and she was started on Lamictal for mood stabilization. She was told by the psychologist that she has "absent seizures" and that "the left side of her brain is getting worse, and this could be affected by her sleep apnea." [facility]  [facility] episodes of unresponsiveness could be due to sleep attacks. She has a diagnosis of obstructive sleep apnea. It appears the spells resolved in 2011.   Since November 2014 or so, she has had episodes of feeling off balance with speech difficulties where speech is slurred and she is unable to find appropriate words. [facility], could not keep her balance, then felt her speech was going. They were at a birthday party on 05/09/13 when it looked like she was waking up, then started "acting like a drunk person." She has been having episodes of blanking out "like she is out in space," occurring 2-3 times a week. She states these are different from the spells she had in 2009. Her husband reports that each time is worse than the other. She has been having headaches on a near daily basis. Headaches are over the vertex or bilateral temporal regions. She hears a "boom" inside her head and wants to pull her hair out. There is no associated nausea/vomiting/photo or phonophobia. She asked if these episodes can be caused by stress, reporting that she has been having more episodes since she has been taking care of her sister the past 3 months. She would "cry over nothing." She has been taking Lexapro for many years and feels that "it does not seem to be working." She takes Ativan 219mtwice a day for anxiety.   Diagnostic Data: I personally reviewed MRI brain without contrast which did not show any acute changes, there was moderate chronic white matter microvascular disease, hippocampi symmetric with no abnormal signal  seen. Her 24-hour EEG was mildly abnormal due to mild diffuse slowing of the background, no epileptiform discharges or electrographic seizures seen. She did not complete diary, but tells me today that she did have an episode where she felt she had a seizure and looked like she fell asleep.   PAST MEDICAL HISTORY: Past  Medical History:  Diagnosis Date  . Asthma   . Diabetes mellitus without complication ([place])   . Diarrhea   . Diverticulosis of colon (without mention of hemorrhage)   . Functional ovarian cysts   . GERD (gastroesophageal reflux disease)   . Hemorrhoids, external, without mention of complication   . Hormone replacement therapy (postmenopausal)   . Hypertension   . IBS (irritable bowel syndrome)   . Migraine   . Right knee DJD   . Seizures ([place])   . Shortness of breath   . Sleep apnea    no cpap    study 2 yrs ago    MEDICATIONS: Current Outpatient Prescriptions on File Prior to Visit  Medication Sig Dispense Refill  . acyclovir (ZOVIRAX) 800 MG tablet Take 800 mg by mouth daily.     [name] albuterol (PROVENTIL HFA;VENTOLIN HFA) 108 (90 BASE) MCG/ACT inhaler Inhale 2 puffs into the lungs every 6 (six) hours as needed for wheezing or shortness of breath. 1 Inhaler 3  . Black Cohosh 40 MG CAPS Take by mouth daily.    . busPIRone (BUSPAR) 15 MG tablet Take 2 tablets by mouth twice daily    . cholecalciferol (VITAMIN D) 1000 UNITS tablet Take 1,000 Units by mouth daily.    . Insulin Lispro, Human, (HUMALOG KWIKPEN [place]) Inject 5 Units into the skin 3 (three) times daily.     [name] lamoTRIgine (LAMICTAL) 200 MG tablet Take 200 mg by mouth 2 (two) times daily.     [name] lisinopril-hydrochlorothiazide (PRINZIDE,ZESTORETIC) 20-12.5 MG per tablet Take 1 tablet by mouth daily.     . metFORMIN (GLUCOPHAGE) 1000 MG tablet Take 1,000 mg by mouth 2 (two) times daily with a meal.    . omeprazole (PRILOSEC) 20 MG capsule Take 20 mg by mouth daily.    . simvastatin (ZOCOR) 20 MG tablet Take 20 mg by mouth every evening.    . traMADol (ULTRAM) 50 MG tablet Take 1 tablet as needed for severe headaches. Do not take more than 2 a week 10 tablet 5  . venlafaxine XR (EFFEXOR-XR) 150 MG 24 hr capsule Take 150 mg by mouth daily with breakfast.    . meloxicam (MOBIC) 7.5 MG tablet Take 7.5 mg by mouth daily.    .  metoprolol succinate (TOPROL XL) 50 MG 24 hr tablet Take 1 tablet (50 mg total) by mouth daily. (Patient not taking: Reported on 12/01/2015) 30 tablet 6  .       No current facility-administered medications on file prior to visit.     ALLERGIES: No Known Allergies  FAMILY HISTORY: Family History  Problem Relation Age of Onset  . Heart disease Mother   . Diabetes Mother   . Heart disease Brother   . Cancer Sister   . Heart disease Brother   . Heart disease Brother     SOCIAL HISTORY: Social History   Social History  . Marital status: Married    Spouse name: N/A  . Number of children: N/A  . Years of education: N/A   Occupational History  . Not on file.   Social History Main Topics  . Smoking status: Never Smoker  .  Smokeless tobacco: Never Used  . Alcohol use No  . Drug use: No  . Sexual activity: Not on file   Other Topics Concern  . Not on file   Social History Narrative  . No narrative on file    REVIEW OF SYSTEMS: Constitutional: No fevers, chills, or sweats, no generalized fatigue, change in appetite Eyes: No visual changes, double vision, eye pain Ear, nose and throat: No hearing loss, ear pain, nasal congestion, sore throat Cardiovascular: No chest pain, palpitations Respiratory:  No shortness of breath at rest or with exertion, wheezes GastrointestinaI: No nausea, vomiting, diarrhea, abdominal pain, fecal incontinence Genitourinary:  No dysuria, urinary retention or frequency Musculoskeletal:  No neck pain, back pain Integumentary: No rash, pruritus, skin lesions Neurological: as above Psychiatric: + depression, no insomnia, + anxiety Endocrine: No palpitations, fatigue, diaphoresis, mood swings, change in appetite, change in weight, increased thirst Hematologic/Lymphatic:  No anemia, purpura, petechiae. Allergic/Immunologic: no itchy/runny eyes, nasal congestion, recent allergic reactions, rashes  PHYSICAL EXAM: Vitals:   12/01/15 0823  BP:  116/70  Pulse: 82  Temp: 98.3 F (36.8 C)   General: No acute distress Head:  Normocephalic/atraumatic Neck: supple, no paraspinal tenderness, full range of motion Heart:  Regular rate and rhythm Lungs:  Clear to auscultation bilaterally Back: No paraspinal tenderness Skin/Extremities: No rash, no edema Neurological Exam: alert and oriented to person, place, and time. No aphasia or dysarthria. Fund of knowledge is appropriate.  Recent and remote memory are intact. 3/3 delayed recall.  Attention and concentration are normal.    Able to name objects and repeat phrases. Cranial nerves: Pupils equal, round, reactive to light.  Fundoscopic exam unremarkable, no papilledema. Extraocular movements intact with no nystagmus. Visual fields full. Facial sensation intact. No facial asymmetry. Tongue, uvula, palate midline.  Motor: Bulk and tone normal, muscle strength 5/5 throughout with no pronator drift.  Sensation to light touch intact.  No extinction to double simultaneous stimulation.  Deep tendon reflexes 2+ throughout, toes downgoing.  Finger to nose testing intact.  Gait slow and cautious due to knee pain, causing difficulty with tandem walk (similar to prior). Romberg negative.  IMPRESSION: This is a 65 yo RH woman with a history of diabetes, hyperlipidemia, headaches, and non-epileptic episodes of decreased responsiveness, who presented with episodes of feeling off balance with slurred speech. MRI brain and 24-hour EEG unremarkable. No further episodes of decreased responsiveness. She gain reports the speech difficulties, likely due to anxiety. Headaches are fairly controlled on amitriptyline 42m qhs, continue current dose. She knows to minimize Tylenol intake to 2-3 a week to avoid rebound headaches. She is asking about medication for low energy, and will discuss this with her PCP. She will follow-up in 1 year and knows to call for any changes.   Thank you for allowing me to participate in her care.   Please do not hesitate to call for any questions or concerns.  The duration of this appointment visit was 25 minutes of face-to-face time with the patient.  Greater than 50% of this time was spent in counseling, explanation of diagnosis, planning of further management, and coordination of care.   KEllouise Newer M.D.   CC: Dr. HSherrie Sport

## 2015-12-01 NOTE — Patient Instructions (Signed)
1. Continue amitriptyline 22m at bedtime 2. Discuss energy levels with PCP 3. Follow-up in 1 year, call for any changes

## 2015-12-05 DIAGNOSIS — M25562 Pain in left knee: Secondary | ICD-10-CM | POA: Diagnosis not present

## 2015-12-05 DIAGNOSIS — Z794 Long term (current) use of insulin: Secondary | ICD-10-CM | POA: Diagnosis not present

## 2015-12-05 DIAGNOSIS — K219 Gastro-esophageal reflux disease without esophagitis: Secondary | ICD-10-CM | POA: Diagnosis not present

## 2015-12-05 DIAGNOSIS — I10 Essential (primary) hypertension: Secondary | ICD-10-CM | POA: Diagnosis not present

## 2015-12-05 DIAGNOSIS — Z6831 Body mass index (BMI) 31.0-31.9, adult: Secondary | ICD-10-CM | POA: Diagnosis not present

## 2015-12-05 DIAGNOSIS — Z79899 Other long term (current) drug therapy: Secondary | ICD-10-CM | POA: Diagnosis not present

## 2015-12-05 DIAGNOSIS — E1142 Type 2 diabetes mellitus with diabetic polyneuropathy: Secondary | ICD-10-CM | POA: Diagnosis not present

## 2016-01-16 DIAGNOSIS — R1084 Generalized abdominal pain: Secondary | ICD-10-CM | POA: Diagnosis not present

## 2016-01-16 DIAGNOSIS — I10 Essential (primary) hypertension: Secondary | ICD-10-CM | POA: Diagnosis not present

## 2016-01-16 DIAGNOSIS — Z6832 Body mass index (BMI) 32.0-32.9, adult: Secondary | ICD-10-CM | POA: Diagnosis not present

## 2016-01-16 DIAGNOSIS — R109 Unspecified abdominal pain: Secondary | ICD-10-CM | POA: Diagnosis not present

## 2016-02-08 ENCOUNTER — Ambulatory Visit (INDEPENDENT_AMBULATORY_CARE_PROVIDER_SITE_OTHER): Payer: PPO | Admitting: Otolaryngology

## 2016-02-08 DIAGNOSIS — H7012 Chronic mastoiditis, left ear: Secondary | ICD-10-CM | POA: Diagnosis not present

## 2016-02-13 ENCOUNTER — Other Ambulatory Visit (INDEPENDENT_AMBULATORY_CARE_PROVIDER_SITE_OTHER): Payer: Self-pay | Admitting: Otolaryngology

## 2016-02-13 DIAGNOSIS — H9500 Recurrent cholesteatoma of postmastoidectomy cavity, unspecified ear: Secondary | ICD-10-CM

## 2016-02-27 ENCOUNTER — Ambulatory Visit (HOSPITAL_COMMUNITY)
Admission: RE | Admit: 2016-02-27 | Discharge: 2016-02-27 | Disposition: A | Discharge status: Home / Self Care | Payer: PPO | Source: Ambulatory Visit | Attending: Otolaryngology | Admitting: Otolaryngology

## 2016-02-27 DIAGNOSIS — H9500 Recurrent cholesteatoma of postmastoidectomy cavity, unspecified ear: Secondary | ICD-10-CM

## 2016-02-27 DIAGNOSIS — H7192 Unspecified cholesteatoma, left ear: Secondary | ICD-10-CM | POA: Diagnosis not present

## 2016-02-29 ENCOUNTER — Ambulatory Visit (INDEPENDENT_AMBULATORY_CARE_PROVIDER_SITE_OTHER): Payer: PPO | Admitting: Otolaryngology

## 2016-02-29 DIAGNOSIS — H7122 Cholesteatoma of mastoid, left ear: Secondary | ICD-10-CM | POA: Diagnosis not present

## 2016-02-29 DIAGNOSIS — H9072 Mixed conductive and sensorineural hearing loss, unilateral, left ear, with unrestricted hearing on the contralateral side: Secondary | ICD-10-CM | POA: Diagnosis not present

## 2016-03-14 ENCOUNTER — Other Ambulatory Visit: Payer: Self-pay | Admitting: Otolaryngology

## 2016-03-14 DIAGNOSIS — I10 Essential (primary) hypertension: Secondary | ICD-10-CM | POA: Diagnosis not present

## 2016-03-14 DIAGNOSIS — K219 Gastro-esophageal reflux disease without esophagitis: Secondary | ICD-10-CM | POA: Diagnosis not present

## 2016-03-14 DIAGNOSIS — Z0000 Encounter for general adult medical examination without abnormal findings: Secondary | ICD-10-CM | POA: Diagnosis not present

## 2016-03-14 DIAGNOSIS — Z1389 Encounter for screening for other disorder: Secondary | ICD-10-CM | POA: Diagnosis not present

## 2016-03-14 DIAGNOSIS — E782 Mixed hyperlipidemia: Secondary | ICD-10-CM | POA: Diagnosis not present

## 2016-03-14 DIAGNOSIS — E1142 Type 2 diabetes mellitus with diabetic polyneuropathy: Secondary | ICD-10-CM | POA: Diagnosis not present

## 2016-03-14 DIAGNOSIS — J4520 Mild intermittent asthma, uncomplicated: Secondary | ICD-10-CM | POA: Diagnosis not present

## 2016-03-14 DIAGNOSIS — Z6833 Body mass index (BMI) 33.0-33.9, adult: Secondary | ICD-10-CM | POA: Diagnosis not present

## 2016-03-28 ENCOUNTER — Encounter (HOSPITAL_BASED_OUTPATIENT_CLINIC_OR_DEPARTMENT_OTHER): Payer: Self-pay | Admitting: *Deleted

## 2016-03-29 ENCOUNTER — Encounter (HOSPITAL_BASED_OUTPATIENT_CLINIC_OR_DEPARTMENT_OTHER)
Admission: RE | Admit: 2016-03-29 | Discharge: 2016-03-29 | Disposition: A | Discharge status: Home / Self Care | Payer: PPO | Source: Ambulatory Visit | Attending: Otolaryngology | Admitting: Otolaryngology

## 2016-03-29 DIAGNOSIS — H7192 Unspecified cholesteatoma, left ear: Secondary | ICD-10-CM | POA: Diagnosis not present

## 2016-03-29 DIAGNOSIS — Z01812 Encounter for preprocedural laboratory examination: Secondary | ICD-10-CM | POA: Diagnosis not present

## 2016-03-29 LAB — BASIC METABOLIC PANEL
ANION GAP: 9 (ref 5–15)
BUN: 11 mg/dL (ref 6–20)
CALCIUM: 9.7 mg/dL (ref 8.9–10.3)
CO2: 28 mmol/L (ref 22–32)
Chloride: 100 mmol/L — ABNORMAL LOW (ref 101–111)
Creatinine, Ser: 0.84 mg/dL (ref 0.44–1.00)
GFR calc Af Amer: >60 mL/min (ref >60)
GFR calc non Af Amer: >60 mL/min (ref >60)
GLUCOSE: 145 mg/dL — AB (ref 65–99)
POTASSIUM: 4 mmol/L (ref 3.5–5.1)
Sodium: 137 mmol/L (ref 135–145)

## 2016-04-02 ENCOUNTER — Ambulatory Visit (HOSPITAL_BASED_OUTPATIENT_CLINIC_OR_DEPARTMENT_OTHER)
Admission: RE | Admit: 2016-04-02 | Discharge: 2016-04-02 | Disposition: A | Discharge status: Home / Self Care | Payer: PPO | Source: Ambulatory Visit | Attending: Otolaryngology | Admitting: Otolaryngology

## 2016-04-02 ENCOUNTER — Encounter (HOSPITAL_BASED_OUTPATIENT_CLINIC_OR_DEPARTMENT_OTHER)
Admission: RE | Disposition: A | Discharge status: Home / Self Care | Payer: Self-pay | Source: Ambulatory Visit | Attending: Otolaryngology

## 2016-04-02 ENCOUNTER — Ambulatory Visit (HOSPITAL_BASED_OUTPATIENT_CLINIC_OR_DEPARTMENT_OTHER): Payer: PPO | Admitting: Anesthesiology

## 2016-04-02 ENCOUNTER — Encounter (HOSPITAL_BASED_OUTPATIENT_CLINIC_OR_DEPARTMENT_OTHER): Payer: Self-pay | Admitting: *Deleted

## 2016-04-02 DIAGNOSIS — H9502 Recurrent cholesteatoma of postmastoidectomy cavity, left ear: Secondary | ICD-10-CM | POA: Diagnosis not present

## 2016-04-02 DIAGNOSIS — Z794 Long term (current) use of insulin: Secondary | ICD-10-CM | POA: Insufficient documentation

## 2016-04-02 DIAGNOSIS — G4730 Sleep apnea, unspecified: Secondary | ICD-10-CM | POA: Insufficient documentation

## 2016-04-02 DIAGNOSIS — I10 Essential (primary) hypertension: Secondary | ICD-10-CM | POA: Insufficient documentation

## 2016-04-02 DIAGNOSIS — K219 Gastro-esophageal reflux disease without esophagitis: Secondary | ICD-10-CM | POA: Diagnosis not present

## 2016-04-02 DIAGNOSIS — E119 Type 2 diabetes mellitus without complications: Secondary | ICD-10-CM | POA: Insufficient documentation

## 2016-04-02 DIAGNOSIS — H9072 Mixed conductive and sensorineural hearing loss, unilateral, left ear, with unrestricted hearing on the contralateral side: Secondary | ICD-10-CM | POA: Diagnosis not present

## 2016-04-02 DIAGNOSIS — H6042 Cholesteatoma of left external ear: Secondary | ICD-10-CM | POA: Diagnosis not present

## 2016-04-02 DIAGNOSIS — J45909 Unspecified asthma, uncomplicated: Secondary | ICD-10-CM | POA: Diagnosis not present

## 2016-04-02 DIAGNOSIS — F329 Major depressive disorder, single episode, unspecified: Secondary | ICD-10-CM | POA: Diagnosis not present

## 2016-04-02 DIAGNOSIS — Z791 Long term (current) use of non-steroidal anti-inflammatories (NSAID): Secondary | ICD-10-CM | POA: Insufficient documentation

## 2016-04-02 DIAGNOSIS — R569 Unspecified convulsions: Secondary | ICD-10-CM | POA: Diagnosis not present

## 2016-04-02 DIAGNOSIS — H7122 Cholesteatoma of mastoid, left ear: Secondary | ICD-10-CM | POA: Diagnosis not present

## 2016-04-02 DIAGNOSIS — H7012 Chronic mastoiditis, left ear: Secondary | ICD-10-CM | POA: Insufficient documentation

## 2016-04-02 DIAGNOSIS — H7192 Unspecified cholesteatoma, left ear: Secondary | ICD-10-CM | POA: Diagnosis not present

## 2016-04-02 DIAGNOSIS — I2510 Atherosclerotic heart disease of native coronary artery without angina pectoris: Secondary | ICD-10-CM | POA: Diagnosis not present

## 2016-04-02 DIAGNOSIS — M1990 Unspecified osteoarthritis, unspecified site: Secondary | ICD-10-CM | POA: Diagnosis not present

## 2016-04-02 HISTORY — PX: TYMPANOMASTOIDECTOMY: SHX34

## 2016-04-02 LAB — GLUCOSE, CAPILLARY: Glucose-Capillary: 113 mg/dL — ABNORMAL HIGH (ref 65–99)

## 2016-04-02 LAB — TROPONIN I: Troponin I: <0.03 ng/mL (ref <0.03)

## 2016-04-02 SURGERY — TYMPANOPLASTY, WITH MASTOIDECTOMY
Anesthesia: General | Laterality: Left

## 2016-04-02 MED ORDER — AMOXICILLIN 875 MG PO TABS: 875.0000 mg | ORAL_TABLET | Freq: Two times a day (BID) | ORAL | 0 refills | Status: DC

## 2016-04-02 MED ORDER — LIDOCAINE HCL (CARDIAC) 20 MG/ML IV SOLN
INTRAVENOUS | Status: DC | PRN
  Administered 2016-04-02: 80 mg via INTRAVENOUS

## 2016-04-02 MED ORDER — EPINEPHRINE 30 MG/30ML IJ SOLN
INTRAMUSCULAR | Status: AC
  Filled 2016-04-02: qty 1

## 2016-04-02 MED ORDER — MIDAZOLAM HCL 2 MG/2ML IJ SOLN
1.0000 mg | INTRAMUSCULAR | Status: DC | PRN
  Administered 2016-04-02 (×2): 1 mg via INTRAVENOUS

## 2016-04-02 MED ORDER — FENTANYL CITRATE (PF) 100 MCG/2ML IJ SOLN
INTRAMUSCULAR | Status: AC
  Filled 2016-04-02: qty 2

## 2016-04-02 MED ORDER — PHENYLEPHRINE HCL 10 MG/ML IJ SOLN
INTRAMUSCULAR | Status: AC
  Filled 2016-04-02: qty 1

## 2016-04-02 MED ORDER — ONDANSETRON HCL 4 MG/2ML IJ SOLN: 4.0000 mg | Freq: Once | INTRAMUSCULAR | Status: DC | PRN

## 2016-04-02 MED ORDER — SUCCINYLCHOLINE CHLORIDE 200 MG/10ML IV SOSY
PREFILLED_SYRINGE | INTRAVENOUS | Status: DC | PRN
  Administered 2016-04-02: 100 mg via INTRAVENOUS

## 2016-04-02 MED ORDER — LIDOCAINE 2% (20 MG/ML) 5 ML SYRINGE
INTRAMUSCULAR | Status: AC
  Filled 2016-04-02: qty 5

## 2016-04-02 MED ORDER — LIDOCAINE-EPINEPHRINE 2 %-1:100000 IJ SOLN
INTRAMUSCULAR | Status: DC | PRN
  Administered 2016-04-02: 1.7 mL via INTRADERMAL

## 2016-04-02 MED ORDER — SCOPOLAMINE 1 MG/3DAYS TD PT72: 1.0000 | MEDICATED_PATCH | Freq: Once | TRANSDERMAL | Status: DC | PRN

## 2016-04-02 MED ORDER — OXYMETAZOLINE HCL 0.05 % NA SOLN
NASAL | Status: AC
  Filled 2016-04-02: qty 15

## 2016-04-02 MED ORDER — PROPOFOL 10 MG/ML IV BOLUS
INTRAVENOUS | Status: DC | PRN
  Administered 2016-04-02: 100 mg via INTRAVENOUS

## 2016-04-02 MED ORDER — CEFAZOLIN SODIUM-DEXTROSE 2-3 GM-% IV SOLR
INTRAVENOUS | Status: DC | PRN
  Administered 2016-04-02: 2 g via INTRAVENOUS

## 2016-04-02 MED ORDER — MIDAZOLAM HCL 2 MG/2ML IJ SOLN
INTRAMUSCULAR | Status: AC
  Filled 2016-04-02: qty 2

## 2016-04-02 MED ORDER — LACTATED RINGERS IV SOLN
INTRAVENOUS | Status: DC
  Administered 2016-04-02 (×2): via INTRAVENOUS

## 2016-04-02 MED ORDER — OXYCODONE-ACETAMINOPHEN 5-325 MG PO TABS: 1.0000 | ORAL_TABLET | ORAL | 0 refills | Status: DC | PRN

## 2016-04-02 MED ORDER — CEFAZOLIN SODIUM 1 G IJ SOLR
INTRAMUSCULAR | Status: AC
  Filled 2016-04-02: qty 10

## 2016-04-02 MED ORDER — BACITRACIN ZINC 500 UNIT/GM EX OINT
TOPICAL_OINTMENT | CUTANEOUS | Status: AC
  Filled 2016-04-02: qty 28.35

## 2016-04-02 MED ORDER — FENTANYL CITRATE (PF) 100 MCG/2ML IJ SOLN
50.0000 ug | INTRAMUSCULAR | Status: DC | PRN
  Administered 2016-04-02 (×2): 50 ug via INTRAVENOUS

## 2016-04-02 MED ORDER — DEXAMETHASONE SODIUM PHOSPHATE 4 MG/ML IJ SOLN
INTRAMUSCULAR | Status: DC | PRN
  Administered 2016-04-02: 10 mg via INTRAVENOUS

## 2016-04-02 MED ORDER — CIPROFLOXACIN-FLUOCINOLONE PF 0.3-0.025 % OT SOLN
OTIC | Status: DC | PRN
  Administered 2016-04-02: 3 mL via OTIC

## 2016-04-02 MED ORDER — ONDANSETRON HCL 4 MG/2ML IJ SOLN
INTRAMUSCULAR | Status: AC
  Filled 2016-04-02: qty 2

## 2016-04-02 MED ORDER — DEXAMETHASONE SODIUM PHOSPHATE 10 MG/ML IJ SOLN
INTRAMUSCULAR | Status: AC
  Filled 2016-04-02: qty 1

## 2016-04-02 MED ORDER — BACITRACIN ZINC 500 UNIT/GM EX OINT
TOPICAL_OINTMENT | CUTANEOUS | Status: DC | PRN
  Administered 2016-04-02: 1 via TOPICAL

## 2016-04-02 MED ORDER — ONDANSETRON HCL 4 MG/2ML IJ SOLN
INTRAMUSCULAR | Status: DC | PRN
  Administered 2016-04-02: 4 mg via INTRAVENOUS

## 2016-04-02 MED ORDER — PHENYLEPHRINE HCL 10 MG/ML IJ SOLN
INTRAVENOUS | Status: DC | PRN
  Administered 2016-04-02: 50 ug/min via INTRAVENOUS

## 2016-04-02 MED ORDER — FENTANYL CITRATE (PF) 100 MCG/2ML IJ SOLN: 25.0000 ug | INTRAMUSCULAR | Status: DC | PRN

## 2016-04-02 SURGICAL SUPPLY — 64 items
ADH SKN CLS APL DERMABOND .7 (GAUZE/BANDAGES/DRESSINGS) ×1
BALL CTTN LRG ABS STRL LF (GAUZE/BANDAGES/DRESSINGS) ×1
BLADE CLIPPER SURG (BLADE) ×2 IMPLANT
BLADE NDL 3 SS STRL (BLADE) IMPLANT
BLADE NEEDLE 3 SS STRL (BLADE) IMPLANT
BLADE NEEDLE 3MM SS STRL (BLADE)
BUR DIAMOND COARSE 3.0 (BURR) ×3 IMPLANT
BUR RND OSTEON ELITE 6.0 (BURR) ×1 IMPLANT
BUR RND OSTEON ELITE 6.0MM (BURR) ×1
CANISTER SUCT 1200ML W/VALVE (MISCELLANEOUS) ×3 IMPLANT
CORDS BIPOLAR (ELECTRODE) IMPLANT
COTTONBALL LRG STERILE PKG (GAUZE/BANDAGES/DRESSINGS) ×3 IMPLANT
DECANTER SPIKE VIAL GLASS SM (MISCELLANEOUS) ×3 IMPLANT
DERMABOND ADVANCED (GAUZE/BANDAGES/DRESSINGS) ×2
DERMABOND ADVANCED .7 DNX12 (GAUZE/BANDAGES/DRESSINGS) ×1 IMPLANT
DRAPE MICROSCOPE WILD 40.5X102 (DRAPES) ×3 IMPLANT
DRAPE SURG 17X23 STRL (DRAPES) ×3 IMPLANT
DRAPE SURG IRRIG POUCH 19X23 (DRAPES) ×3 IMPLANT
DRSG GLASSCOCK MASTOID ADT (GAUZE/BANDAGES/DRESSINGS) ×3 IMPLANT
DRSG GLASSCOCK MASTOID PED (GAUZE/BANDAGES/DRESSINGS) IMPLANT
ELECT COATED BLADE 2.86 ST (ELECTRODE) ×3 IMPLANT
ELECT PAIRED SUBDERMAL (MISCELLANEOUS) ×3
ELECT REM PT RETURN 9FT ADLT (ELECTROSURGICAL) ×3
ELECTRODE PAIRED SUBDERMAL (MISCELLANEOUS) ×1 IMPLANT
ELECTRODE REM PT RTRN 9FT ADLT (ELECTROSURGICAL) ×1 IMPLANT
GAUZE SPONGE 4X4 12PLY STRL (GAUZE/BANDAGES/DRESSINGS) IMPLANT
GLOVE BIO SURGEON STRL SZ7.5 (GLOVE) ×3 IMPLANT
GLOVE SURG SS PI 7.0 STRL IVOR (GLOVE) ×2 IMPLANT
GOWN STRL REUS W/ TWL LRG LVL3 (GOWN DISPOSABLE) ×2 IMPLANT
GOWN STRL REUS W/TWL LRG LVL3 (GOWN DISPOSABLE) ×6
HEMOSTAT SURGICEL .5X2 ABSORB (HEMOSTASIS) IMPLANT
HEMOSTAT SURGICEL 2X14 (HEMOSTASIS) IMPLANT
IV CATH AUTO 14GX1.75 SAFE ORG (IV SOLUTION) ×3 IMPLANT
IV NS 500ML (IV SOLUTION) ×3
IV NS 500ML BAXH (IV SOLUTION) ×1 IMPLANT
IV SET EXT 30 76VOL 4 MALE LL (IV SETS) ×3 IMPLANT
NDL FILTER BLUNT 18X1 1/2 (NEEDLE) ×1 IMPLANT
NDL HYPO 25X1 1.5 SAFETY (NEEDLE) ×1 IMPLANT
NDL SAFETY ECLIPSE 18X1.5 (NEEDLE) ×1 IMPLANT
NEEDLE FILTER BLUNT 18X 1/2SAF (NEEDLE)
NEEDLE FILTER BLUNT 18X1 1/2 (NEEDLE) IMPLANT
NEEDLE HYPO 18GX1.5 SHARP (NEEDLE)
NEEDLE HYPO 25X1 1.5 SAFETY (NEEDLE) ×3 IMPLANT
NS IRRIG 1000ML POUR BTL (IV SOLUTION) ×3 IMPLANT
PACK AMBRUS EAR (MISCELLANEOUS) ×2 IMPLANT
PACK BASIN DAY SURGERY FS (CUSTOM PROCEDURE TRAY) ×3 IMPLANT
PACK ENT DAY SURGERY (CUSTOM PROCEDURE TRAY) ×3 IMPLANT
PENCIL BUTTON HOLSTER BLD 10FT (ELECTRODE) ×3 IMPLANT
PROBE NERVBE PRASS .33 (MISCELLANEOUS) IMPLANT
SLEEVE SCD COMPRESS KNEE MED (MISCELLANEOUS) ×2 IMPLANT
SPONGE GAUZE 4X4 12PLY STER LF (GAUZE/BANDAGES/DRESSINGS) IMPLANT
SPONGE SURGIFOAM ABS GEL 12-7 (HEMOSTASIS) ×3 IMPLANT
SUT BONE WAX W31G (SUTURE) IMPLANT
SUT VIC AB 3-0 SH 27 (SUTURE)
SUT VIC AB 3-0 SH 27X BRD (SUTURE) IMPLANT
SUT VIC AB 4-0 P-3 18XBRD (SUTURE) IMPLANT
SUT VIC AB 4-0 P3 18 (SUTURE)
SUT VICRYL 4-0 PS2 18IN ABS (SUTURE) ×2 IMPLANT
SYR 5ML LL (SYRINGE) ×3 IMPLANT
SYR BULB 3OZ (MISCELLANEOUS) ×3 IMPLANT
TOWEL OR 17X24 6PK STRL BLUE (TOWEL DISPOSABLE) ×3 IMPLANT
TOWEL OR NON WOVEN STRL DISP B (DISPOSABLE) ×3 IMPLANT
TRAY DSU PREP LF (CUSTOM PROCEDURE TRAY) ×3 IMPLANT
TUBING IRRIGATION (MISCELLANEOUS) ×1 IMPLANT

## 2016-04-02 NOTE — Discharge Instructions (Addendum)
See Dr. Jacinta Shoe Friday 04/05/2016 at 3:20 pm  Post Anesthesia Home Care Instructions  Activity: Get plenty of rest for the remainder of the day. A responsible adult should stay with you for 24 hours following the procedure.  For the next 24 hours, DO NOT: -Drive a car -Paediatric nurse -Drink alcoholic beverages -Take any medication unless instructed by your physician -Make any legal decisions or sign important papers.  Meals: Start with liquid foods such as gelatin or soup. Progress to regular foods as tolerated. Avoid greasy, spicy, heavy foods. If nausea and/or vomiting occur, drink only clear liquids until the nausea and/or vomiting subsides. Call your physician if vomiting continues.  Special Instructions/Symptoms: Your throat may feel dry or sore from the anesthesia or the breathing tube placed in your throat during surgery. If this causes discomfort, gargle with warm salt water. The discomfort should disappear within 24 hours.  If you had a scopolamine patch placed behind your ear for the management of post- operative nausea and/or vomiting:  1. The medication in the patch is effective for 72 hours, after which it should be removed.  Wrap patch in a tissue and discard in the trash. Wash hands thoroughly with soap and water. 2. You may remove the patch earlier than 72 hours if you experience unpleasant side effects which may include dry mouth, dizziness or visual disturbances. 3. Avoid touching the patch. Wash your hands with soap and water after contact with the patch.   POSTOPERATIVE INSTRUCTIONS FOR PATIENTS HAVING MASTOIDECTOMY SURGERY  1. You may have nausea, vomiting, or a low grade fever for a few days after surgery. This is not unusual.  However, if the nausea and vomiting become severe or last more than one day, please call our office. Medication for nausea may be prescribed. You may take Tylenol every four hours for fever. If your fever should rise above 101 F, please  contact our office. 2. Limit your activities for one week. This includes avoiding heavy lifting (over 20lbs), vigorous exercise, and contact sports. 3. Do not blow your nose for approximately one week.  Any accumulation in the nose should be drawn back into the throat and expectorated through the mouth to avoid infecting the ear. If it is necessary to sneeze, do so with your mouth open to decrease pressure to your ears. Do not hold your nose to avoid sneezing.  4. You may wash your hair 2 days after the operation. Please protect the ear and any external incision from water. We recommend placing some plastic wrap over the ear and incision to help protect against water. It may be necessary to have someone help you during the first several washings.  5. Try to keep the incision clean and dry. You should clean crust from the incisional area with diluted hydrogen peroxide. Any time you are going to clean your ear, please wash your hands thoroughly prior to starting. 6. Some dull postoperative ear pain is expected. Your physician may prescribe pain medicine to help relieve your discomfort. If your postoperative pain increases and your medication is not helping, please call the office before taking any other medication that we have not prescribed or recommended. 7. If any of the following should occur, contact Dr.Teoh:   (Office: [phone])) a. Persistent bleeding  b. Persistent fever c. Purulent drainage (pus) from the ear or incision d. Increasing redness around the suture line e. Persistent pain or dizziness f. Facial weakness 8. Sometimes, with a larger incision behind the ear, the incision may open and drain. If it occurs, please contact our office. 9. If your physician prescribes an antibiotic, fill the prescription promptly and take all of the medicine as directed until the entire supply is gone. 10.  You may experience some popping and  cracking sounds in the ear for up to several weeks. It may sound like you are talking in a barrel or a tunnel. This is normal and should not cause concern. 11. Because a nerve for taste passes through your ear, it is not unusual for your taste sensation to be altered for several weeks or months. 12. You may experience some numbness in your outer ear, earlobe, and the incision area. This is normal, and most of the numbness will be expected to fade over a period of time.                                                               13. Your eardrum may look pink or red for up to a month postoperatively. The red coloration is due to fluid in the middle ear. The change in color should not be confused with infection.  14. It is important to return to our office for your postoperative appointment as scheduled. If for some reason you were not given a postoperative appointment, please call our office at [phone].

## 2016-04-02 NOTE — Anesthesia Postprocedure Evaluation (Signed)
Anesthesia Post Note  Patient: Carol Richmond  Procedure(s) Performed: Procedure(s) (LRB): LEFT CANAL WALL DOWN TYMPANOMASTOIDECTOMY (Left)  Patient location during evaluation: PACU Anesthesia Type: General Level of consciousness: awake and alert Pain management: pain level controlled Vital Signs Assessment: post-procedure vital signs reviewed and stable Respiratory status: spontaneous breathing, nonlabored ventilation, respiratory function stable and patient connected to nasal cannula oxygen Cardiovascular status: blood pressure returned to baseline and stable Postop Assessment: no signs of nausea or vomiting Anesthetic complications: no Comments: New onset Left Bundle Branch block noticed in OR.  In PACU, patient denies chest pain, SOB. VSS on room air.  12 lead EKG showing LBBB (NSR in 9/17).  Troponin negative x1.  Spoke with patient's cardiologist office who will see patient as outpatient followup for new LBBB on 04/05/16.  New appointment communicated to patient in discharge instructions.       Last Vitals:  Vitals:   04/02/16 1300 04/02/16 1315  BP: (!) 110/91 116/61  Pulse: 92 92  Resp: 15 18  Temp:      Last Pain:  Vitals:   04/02/16 0819  TempSrc: Oral                 Catalina Gravel

## 2016-04-02 NOTE — Progress Notes (Signed)
Dr. Gifford Shave aware of Troponin level 0.02. No new orders. Talked with patient.

## 2016-04-02 NOTE — Anesthesia Procedure Notes (Signed)
Procedure Name: Intubation Date/Time: 04/02/2016 9:47 AM Performed by: Lyndee Leo Pre-anesthesia Checklist: Patient identified, Emergency Drugs available, Suction available and Patient being monitored Patient Re-evaluated:Patient Re-evaluated prior to inductionOxygen Delivery Method: Circle system utilized Preoxygenation: Pre-oxygenation with 100% oxygen Intubation Type: IV induction Ventilation: Mask ventilation without difficulty Laryngoscope Size: Mac and 3 Grade View: Grade I Tube type: Oral Rae Tube size: 7.0 mm Number of attempts: 1 Airway Equipment and Method: Stylet and Oral airway Placement Confirmation: ETT inserted through vocal cords under direct vision,  positive ETCO2 and breath sounds checked- equal and bilateral Secured at: 19 cm Tube secured with: Tape Dental Injury: Teeth and Oropharynx as per pre-operative assessment

## 2016-04-02 NOTE — Transfer of Care (Signed)
Immediate Anesthesia Transfer of Care Note  Patient: Carol Richmond  Procedure(s) Performed: Procedure(s): LEFT CANAL WALL DOWN TYMPANOMASTOIDECTOMY (Left)  Patient Location: PACU  Anesthesia Type:General  Level of Consciousness: sedated and responds to stimulation  Airway & Oxygen Therapy: Patient Spontanous Breathing and Patient connected to face mask oxygen  Post-op Assessment: Report given to RN and Post -op Vital signs reviewed and stable  Post vital signs: Reviewed and stable  Last Vitals:  Vitals:   04/02/16 1156 04/02/16 1158  BP: 110/74   Pulse: (!) 104 99  Resp: 11 11  Temp:      Last Pain:  Vitals:   04/02/16 0819  TempSrc: Oral         Complications: No apparent anesthesia complications

## 2016-04-02 NOTE — Anesthesia Preprocedure Evaluation (Addendum)
Anesthesia Evaluation  Patient identified by MRN, date of birth, ID band Patient awake    Reviewed: Allergy & Precautions, NPO status , Patient's Chart, lab work & pertinent test results  Airway Mallampati: III  TM Distance: >3 FB Neck ROM: Full    Dental  (+) Dental Advisory Given, Lower Dentures, Upper Dentures   Pulmonary asthma , sleep apnea ,    Pulmonary exam normal breath sounds clear to auscultation       Cardiovascular hypertension, Pt. on medications (-) angina+ CAD  (-) Past MI, (-) Cardiac Stents and (-) CHF Normal cardiovascular exam Rhythm:Regular Rate:Normal     Neuro/Psych  Headaches, Seizures -, Well Controlled,  PSYCHIATRIC DISORDERS Depression    GI/Hepatic Neg liver ROS, GERD  Medicated,  Endo/Other  diabetes, Type 2, Insulin Dependent, Oral Hypoglycemic AgentsObesity   Renal/GU negative Renal ROS     Musculoskeletal  (+) Arthritis ,   Abdominal   Peds  Hematology negative hematology ROS (+)   Anesthesia Other Findings Day of surgery medications reviewed with the patient.  Reproductive/Obstetrics                          Anesthesia Physical Anesthesia Plan  ASA: II  Anesthesia Plan: General   Post-op Pain Management:    Induction: Intravenous  Airway Management Planned: Oral ETT  Additional Equipment:   Intra-op Plan:   Post-operative Plan: Extubation in OR  Informed Consent: I have reviewed the patients History and Physical, chart, labs and discussed the procedure including the risks, benefits and alternatives for the proposed anesthesia with the patient or authorized representative who has indicated his/her understanding and acceptance.   Dental advisory given  Plan Discussed with: CRNA  Anesthesia Plan Comments: (Risks/benefits of general anesthesia discussed with patient including risk of damage to teeth, lips, gum, and tongue, nausea/vomiting, allergic  reactions to medications, and the possibility of heart attack, stroke and death.  All patient questions answered.  Patient wishes to proceed.)        Anesthesia Quick Evaluation

## 2016-04-02 NOTE — Op Note (Signed)
DATE OF PROCEDURE: 04/02/2016  SURGEON: Leta Baptist, MD  OPERATIVE REPORT   PREOPERATIVE DIAGNOSIS:  1. Left ear cholesteatoma 2. Left chronic otomastoiditis 3. Left conductive hearing loss  POSTOPERATIVE DIAGNOSIS:  1. Left ear cholesteatoma 2. Left chronic otomastoiditis 3. Left conductive hearing loss  PROCEDURES PERFORMED: 1. Left modified radical canal-wall-down tympanomastoidectomy  ANESTHESIA: General endotracheal tube anesthesia.  COMPLICATIONS: None.  ESTIMATED BLOOD LOSS: 126m  INDICATION FOR PROCEDURE: Carol Bouchieis a 66y.o. female with a history of left ear otomastoiditis and left ear cholesteatoma.  She underwent canal wall up tympanomastoidectomy surgery in 2014. Over the past year, the patient has been experiencing chronic left otorrhea. On examination, she was noted to have left ear recurrent cholesteatoma and chronic otomastoiditis.  Based on the above findings, the decision was made for patient to undergo the above-stated procedure. The risks, benefits, alternatives, and details of the procedures were discussed with the patient. Questions were invited and answered. Informed consent was obtained.  DESCRIPTION OF PROCEDURE: The patient was taken to the operating room and placed supine on the operating table. General endotracheal tube anesthesia was induced by the anesthesiologist.The patient was positioned and prepped and draped in a standard fashion for left ear surgery. Facial nerve monitoring electrodes were placed. The facial nerve monitoring system was functional throughout the case.  2% lidocaine with 1-100,000 epinephrine was infiltrated into the left postauricular crease and into all 4 quadrants of the ear canal. Under the operating microscope, the left ear canal was cleaned of all cerumen. The superior ear canal was noted to be eroded by cholesteatoma. A standard tympanomeatal flap was elevated in a standard fashion. The malleus and incus  were eroded.  A standard postauricular incision was made. The soft tissue covering the mastoid cortex was carefully elevated. A 2 x 2 centimeters temporalis fascia graft was harvested in the standard fashion. Using a #5 cutting bur, a standard cortical mastoidectomy procedure was performed. The tegmen and sigmoid sinus were identified and preserved. The posterior canal wall was taken down to the level of the facial nerve. The antrum was entered.  The stapes was noted to be intact and mobile. All visible cholesteatoma was removed. The mastoid and middle ear space were copiously irrigated. The previously harvested temporalis fascia graft was used to reconstruct a new tympanum. Gelfoam soaked with otovel solution was used to pack the middle ear space and the mastoid cavity.  Attention was then focused on the meatoplasty portion of the case. A 6:00 and 12:00 incision was made. The posterior concha cartilage was removed. The posterior soft tissue flap was then sutured to the posterior aspect of the mastoid cavity. A large meatal opening was achieved.  The postauricular incision was then closed in layers with 4-0 Vicryl and Dermabond. A Glasscock dressing was applied. That concluded the procedure for the patient. The care of the patient was turned over to the anesthesiologist. The patient was awakened from anesthesia without difficulty. She was extubated and transferred to the recovery room in good condition.  OPERATIVE FINDINGS: Left middle ear and mastoid cholesteatoma, eroding the ear canal, malleus, and incus. The stapes was noted to be intact and mobile.  SPECIMEN: Right middle ear and mastoid contents  FOLLOWUP CARE: The patient will be discharged home once she is awake and alert. She will follow up in my office in 1 week.

## 2016-04-02 NOTE — H&P (Signed)
Cc: Recurrent left ear cholesteatoma, otomastoiditis  HPI: The patient is a 66 year old female who returns today for her follow-up evaluation.  The patient was last seen 2 weeks ago.  At that time, she was noted to have chronic left otomastoiditis, recurrent cholesteatoma was also noted.  She was treated with Otovel eardrops and clindamycin. She subsequently underwent a temporal bone CT scan. The CT showed recurrent left ear cholesteatoma. The cholesteatoma has eroded the superior external auditory canal. The mastoid bowl, the upper middle ear space, and the medial aspect of the external auditory canal are all opacified.  The patient returns today reporting persistent drainage from her left ear.  She continues to have significant difficulty hearing on the left side.  She denies any vertigo or fever. No other ENT, GI, or respiratory issue noted since the last visit.   Exam General: Communicates without difficulty, well nourished, no acute distress. Head: Normocephalic, no evidence injury, no tenderness, facial buttresses intact without stepoff. Eyes: PERRL, EOMI. No scleral icterus, conjunctivae clear. Neuro: CN II exam reveals vision grossly intact.  No nystagmus at any point of gaze. Ears: Left ear canal with recurrent cholesteatoma and a small amount of purulent drainge. The right ear is within normal limits. Nose: External evaluation reveals normal support and skin without lesions.  Dorsum is intact.  Anterior rhinoscopy reveals healthy pink mucosa over anterior aspect of inferior turbinates and intact septum.  No purulence noted. Oral:  Oral cavity and oropharynx are intact, symmetric, without erythema or edema.  Mucosa is moist without lesions. Neck: Full range of motion without pain.  There is no significant lymphadenopathy.  No masses palpable.  Thyroid bed within normal limits to palpation.  Parotid glands and submandibular glands equal bilaterally without mass.  Trachea is midline. Neuro:  CN 2-12  grossly intact. Gait normal. Vestibular: No nystagmus at any point of gaze. The cerebellar examination is unremarkable.   AUDIOMETRIC TESTING:  I reviewed the audiometric result. The test shows left ear mixed hearing loss. The speech reception threshold is 45dB AD and 85dB AS. The discrimination score is 60% AD and CNT  AS. The tympanogram is normal on the right.  Assessment 1.  Recurrent left ear cholesteatoma resulting in chronic otomastoiditis.  The superior aspect of the external auditory canal is noted to be eroded by the cholesteatoma.  2.  Left ear mixed hearing loss.  Plan  1.  The physical exam findings and the CT images are reviewed by the patient.  2.  Based on the above findings, the patient will benefit from undergoing canal wall-down tympanomastoidectomy surgery to treat her recurrent cholesteatoma and persistent infection. The risks, alternatives and details of the procedure are extensively reviewed. Questions are invited and answered.  3.  The patient would like to proceed with the procedure.  We will schedule the procedure in accordance with the patient's schedule.

## 2016-04-03 ENCOUNTER — Encounter (HOSPITAL_BASED_OUTPATIENT_CLINIC_OR_DEPARTMENT_OTHER): Payer: Self-pay | Admitting: Otolaryngology

## 2016-04-05 ENCOUNTER — Encounter: Payer: Self-pay | Admitting: Cardiovascular Disease

## 2016-04-05 ENCOUNTER — Ambulatory Visit (INDEPENDENT_AMBULATORY_CARE_PROVIDER_SITE_OTHER): Payer: PPO | Admitting: Cardiovascular Disease

## 2016-04-05 VITALS — BP 112/74 | HR 77 | Ht 64.0 in | Wt 188.0 lb

## 2016-04-05 DIAGNOSIS — I447 Left bundle-branch block, unspecified: Secondary | ICD-10-CM | POA: Diagnosis not present

## 2016-04-05 DIAGNOSIS — I10 Essential (primary) hypertension: Secondary | ICD-10-CM | POA: Diagnosis not present

## 2016-04-05 DIAGNOSIS — R000 Tachycardia, unspecified: Secondary | ICD-10-CM

## 2016-04-05 DIAGNOSIS — E7800 Pure hypercholesterolemia, unspecified: Secondary | ICD-10-CM | POA: Diagnosis not present

## 2016-04-05 NOTE — Patient Instructions (Signed)
Your physician wants you to follow-up in: 1 year with Dr. Bronson Ing. You will receive a reminder letter in the mail two months in advance. If you don't receive a letter, please call our office to schedule the follow-up appointment.    Your physician has requested that you have an echocardiogram. Echocardiography is a painless test that uses sound waves to create images of your heart. It provides your doctor with information about the size and shape of your heart and how well your heart's chambers and valves are working. This procedure takes approximately one hour. There are no restrictions for this procedure.   Your physician recommends that you continue on your current medications as directed. Please refer to the Current Medication list given to you today.    Thank you for choosing [facility]!!

## 2016-04-05 NOTE — Progress Notes (Signed)
SUBJECTIVE: The patient presents for routine follow-up. Coronary angiography on 11/18/13 showed mild nonobstructive disease. She has an inappropriate sinus tachycardia, hypertension, and hyperlipidemia. She has had chest pains in the past with no relief with Ranexa.  ECG in September 2017 showed normal sinus rhythm.  ECG 04/02/16 showed sinus rhythm with a new left bundle-branch block.  Echocardiogram 10/20/13: Normal left ventricular systolic function, LVEF 41-74%, mild LVH, grade 1 diastolic dysfunction, mild mitral regurgitation.  She currently denies chest pain. She has occasional shortness of breath which has not gotten any worse. She underwent left ear surgery on 04/02/16 and his had some dizziness. She denies orthopnea and leg swelling. She does say that her left leg "gives out "when she walks.   Review of Systems: As per "subjective", otherwise negative.  No Known Allergies  Current Outpatient Prescriptions  Medication Sig Dispense Refill  . acyclovir (ZOVIRAX) 800 MG tablet Take 800 mg by mouth daily.     [name] albuterol (PROVENTIL HFA;VENTOLIN HFA) 108 (90 BASE) MCG/ACT inhaler Inhale 2 puffs into the lungs every 6 (six) hours as needed for wheezing or shortness of breath. 1 Inhaler 3  . amitriptyline (ELAVIL) 50 MG tablet Take 50 mg by mouth at bedtime.    . Black Cohosh 40 MG CAPS Take by mouth daily.    . busPIRone (BUSPAR) 15 MG tablet Take 2 tablets by mouth twice daily    . cholecalciferol (VITAMIN D) 1000 UNITS tablet Take 1,000 Units by mouth daily.    . Fluticasone Furoate-Vilanterol (BREO ELLIPTA IN) Inhale into the lungs.    . gabapentin (NEURONTIN) 100 MG capsule Take 100 mg by mouth 3 (three) times daily.    . Insulin Lispro, Human, (HUMALOG KWIKPEN [place]) Inject 2.5 Units into the skin 3 (three) times daily.     [name] lamoTRIgine (LAMICTAL) 200 MG tablet Take 200 mg by mouth 2 (two) times daily.     [name] levocetirizine (XYZAL) 5 MG tablet Take 5 mg by mouth every evening.      [name] lisinopril-hydrochlorothiazide (PRINZIDE,ZESTORETIC) 20-12.5 MG per tablet Take 1 tablet by mouth daily.     . meloxicam (MOBIC) 7.5 MG tablet Take 7.5 mg by mouth daily.    . metFORMIN (GLUCOPHAGE) 1000 MG tablet Take 1,000 mg by mouth 2 (two) times daily with a meal.    . metoprolol succinate (TOPROL XL) 50 MG 24 hr tablet Take 1 tablet (50 mg total) by mouth daily. 30 tablet 6  . omeprazole (PRILOSEC) 20 MG capsule Take 20 mg by mouth daily.    . simvastatin (ZOCOR) 20 MG tablet Take 20 mg by mouth every evening.    . venlafaxine XR (EFFEXOR-XR) 150 MG 24 hr capsule Take 150 mg by mouth daily with breakfast.     No current facility-administered medications for this visit.     Past Medical History:  Diagnosis Date  . Asthma   . Diabetes mellitus without complication ([place])   . Diarrhea   . Diverticulosis of colon (without mention of hemorrhage)   . Functional ovarian cysts   . GERD (gastroesophageal reflux disease)   . Hemorrhoids, external, without mention of complication   . Hormone replacement therapy (postmenopausal)   . Hypertension   . IBS (irritable bowel syndrome)   . Migraine   . Right knee DJD   . Seizures ([place])   . Shortness of breath   . Sleep apnea    no cpap    study 2 yrs ago  Past Surgical History:  Procedure Laterality Date  . CHOLECYSTECTOMY  1980  . LEFT HEART CATHETERIZATION WITH CORONARY ANGIOGRAM N/A 11/18/2013   Procedure: LEFT HEART CATHETERIZATION WITH CORONARY ANGIOGRAM;  Surgeon: Burnell Blanks, MD;  Location: [facility] CATH LAB;  Service: Cardiovascular;  Laterality: N/A;  . OOPHORECTOMY     2012 Dr. Adah Perl in [place]  . TONSILLECTOMY    . TOTAL KNEE ARTHROPLASTY Right 09/02/2012   Procedure: TOTAL KNEE ARTHROPLASTY- right;  Surgeon: Ninetta Lights, MD;  Location: [facility];  Service: Orthopedics;  Laterality: Right;  . TUBAL LIGATION    . TYMPANOMASTOIDECTOMY Left 04/02/2016   Procedure: LEFT CANAL WALL DOWN TYMPANOMASTOIDECTOMY;  Surgeon: Leta Baptist, MD;  Location: [facility];  Service: ENT;  Laterality: Left;  [name] VAGINAL HYSTERECTOMY  1990    Social History   Social History  . Marital status: Married    Spouse name: N/A  . Number of children: N/A  . Years of education: N/A   Occupational History  . Not on file.   Social History Main Topics  . Smoking status: Never Smoker  . Smokeless tobacco: Never Used  . Alcohol use No  . Drug use: No  . Sexual activity: Not on file   Other Topics Concern  . Not on file   Social History Narrative  . No narrative on file     Vitals:   04/05/16 1506  BP: 112/74  Pulse: 77  SpO2: 97%  Weight: 188 lb (85.3 kg)  Height: 5' 4"  (1.626 m)    PHYSICAL EXAM General: NAD HEENT: Normal. Neck: No JVD, no thyromegaly. Lungs: Clear to auscultation bilaterally with normal respiratory effort. CV: Nondisplaced PMI. Regular rate and rhythm, normal S1/S2, no G2/X5, soft 1/6 systolic murmur along left sternal border. No pretibial or periankle edema.  Abdomen: Soft, nontender, obese, no distention.  Neurologic: Alert and oriented x 3.  Psych: Normal affect. Skin: Normal. Musculoskeletal: No gross deformities. Extremities: No clubbing or cyanosis.    ECG: Most recent ECG reviewed.      ASSESSMENT AND PLAN:  1. Shortness of breath/inappropriate sinus tachycardia: Symptomatically stable with Toprol-XL 50 mg daily.   2. Essential HTN: Well controlled on current therapy which includes Prinzide 20-12.5 mg daily. No changes.  3. Hyperlipidemia: Continue statin.  4. Chest pain: Symptomatically stable. Mild nonobstructive CAD seen on 11/18/13. Given her history of diabetes with exertional chest pain in the past, she may have microvascular angina. However, Ranexa did not help.   5. New LBBB: Will obtain echocardiogram to assess for interval changes in regional wall motion.   Dispo: f/u 1 year.  Kate Sable, M.D., F.A.C.C.

## 2016-04-08 ENCOUNTER — Ambulatory Visit (INDEPENDENT_AMBULATORY_CARE_PROVIDER_SITE_OTHER): Payer: PPO | Admitting: Otolaryngology

## 2016-04-09 DIAGNOSIS — H524 Presbyopia: Secondary | ICD-10-CM | POA: Diagnosis not present

## 2016-04-09 DIAGNOSIS — H35033 Hypertensive retinopathy, bilateral: Secondary | ICD-10-CM | POA: Diagnosis not present

## 2016-04-24 ENCOUNTER — Telehealth: Payer: Self-pay | Admitting: Neurology

## 2016-04-24 NOTE — Telephone Encounter (Signed)
Jan Fireman [birth year]. Her # [phone]. She said she has been having trouble with her Left leg. She said it will just give out. She has been moved up to 08/16/16 and is on a wait list.  She would like you to please call her. Thank you

## 2016-04-25 DIAGNOSIS — H65192 Other acute nonsuppurative otitis media, left ear: Secondary | ICD-10-CM | POA: Diagnosis not present

## 2016-04-25 DIAGNOSIS — M12562 Traumatic arthropathy, left knee: Secondary | ICD-10-CM | POA: Diagnosis not present

## 2016-04-25 DIAGNOSIS — Z6833 Body mass index (BMI) 33.0-33.9, adult: Secondary | ICD-10-CM | POA: Diagnosis not present

## 2016-04-25 DIAGNOSIS — H671 Otitis media in diseases classified elsewhere, right ear: Secondary | ICD-10-CM | POA: Diagnosis not present

## 2016-04-25 NOTE — Telephone Encounter (Signed)
Left message on machine for patient to call back.

## 2016-04-25 NOTE — Telephone Encounter (Signed)
Tried to call patient with no answer and no voicemail set up.

## 2016-04-30 ENCOUNTER — Telehealth (HOSPITAL_COMMUNITY): Payer: Self-pay | Admitting: *Deleted

## 2016-04-30 NOTE — Telephone Encounter (Signed)
phone call to schedule an appointment for patient.   Spoke with patient, she said she don't know about an appointment.   The patient's daughter also spoke with me and said that the patient do not want to come and they can't force her.   Said she would speak with the family and get back with [place] if they need to.

## 2016-05-01 ENCOUNTER — Other Ambulatory Visit: Payer: Self-pay

## 2016-05-01 ENCOUNTER — Ambulatory Visit (INDEPENDENT_AMBULATORY_CARE_PROVIDER_SITE_OTHER): Payer: PPO

## 2016-05-01 DIAGNOSIS — I447 Left bundle-branch block, unspecified: Secondary | ICD-10-CM | POA: Diagnosis not present

## 2016-05-01 NOTE — Progress Notes (Signed)
Psychiatric Initial Adult Assessment   Patient Identification: Carol Richmond MRN:  [id number] Date of Evaluation:  05/02/2016 Referral Source: Self Chief Complaint:   Chief Complaint    Memory Loss; New Evaluation    "I got hateful in my body" Visit Diagnosis:    ICD-9-CM ICD-10-CM   1. Neurocognitive deficits 781.99 R29.818 TSH    R41.89 Vitamin B12     Folate     Hepatic function panel     CBC    History of Present Illness:   Carol Richmond is a 66 year old female with a history of depression, diabetes, hyperlipidemia, headaches, and non-epileptic episodes of decreased responsiveness followed by neurologist, who is referred for confusion.  She is relatively a poor historian and she is unable to elaborate the story in details.  She reports that she is here as she was advised by her husband for irritability. She states that "I have hateful in my body, although I do not." When she is asked what she meant by it, she is unable to elaborate it. She then states that she tends to stay in the house, although she wants to go out. She agrees that she feels confused and has memory loss over the past year, when she was asked "confusion" which was written in intake sheet by her daughter. She states that she say things "I shouldn't say." She then talks about an episode when her husband gets mad at her, as she did not respond to what he said. She denies issues with getting lost or difficulty with grocery shopping. She cooks at times without difficulty. She takes care of her medication.   She denies insomnia. She feels depressed at times. She denies SI. She reports mild anxiety. She denies panic attack. She denies decreased need for sleep. She denies alcohol or drug use. She denies any episode of seizure "for long."  Her husband presents to the interview. (He is also a relatively a poor historian) He reports that she tends to be irritable, although he denies any physical violence. He talks  about patient ear infection, which causes her difficulty with communication. She tends to "stagger" at home.   Associated Signs/Symptoms: Depression Symptoms:  depressed mood, anhedonia, insomnia, fatigue, (Hypo) Manic Symptoms:  denies Anxiety Symptoms:  mild anxiety Psychotic Symptoms:  denies PTSD Symptoms: Negative  Past Psychiatric History:  Outpatient: used to see Dr. Sima Matas for "same thing" Psychiatry admission: denies Previous suicide attempt: denies Past trials of medication: lamotrigine 200 mg daily, amitriptyline 50 mg at night, buspirone 15 mg BID History of violence: denies  Previous Psychotropic Medications: Yes   Substance Abuse History in the last 12 months:  No.  Consequences of Substance Abuse: NA  Past Medical History:  Past Medical History:  Diagnosis Date  . Asthma   . Diabetes mellitus without complication ([place])   . Diarrhea   . Diverticulosis of colon (without mention of hemorrhage)   . Functional ovarian cysts   . GERD (gastroesophageal reflux disease)   . Hemorrhoids, external, without mention of complication   . Hormone replacement therapy (postmenopausal)   . Hypertension   . IBS (irritable bowel syndrome)   . Migraine   . Right knee DJD   . Seizures ([place])   . Shortness of breath   . Sleep apnea    no cpap    study 2 yrs ago    Past Surgical History:  Procedure Laterality Date  . CHOLECYSTECTOMY  1980  . LEFT HEART CATHETERIZATION WITH  CORONARY ANGIOGRAM N/A 11/18/2013   Procedure: LEFT HEART CATHETERIZATION WITH CORONARY ANGIOGRAM;  Surgeon: Burnell Blanks, MD;  Location: [facility] CATH LAB;  Service: Cardiovascular;  Laterality: N/A;  . OOPHORECTOMY     2012 Dr. Adah Perl in [place]  . TONSILLECTOMY    . TOTAL KNEE ARTHROPLASTY Right 09/02/2012   Procedure: TOTAL KNEE ARTHROPLASTY- right;  Surgeon: Ninetta Lights, MD;  Location: [facility];  Service: Orthopedics;  Laterality: Right;  . TUBAL LIGATION    . TYMPANOMASTOIDECTOMY Left  04/02/2016   Procedure: LEFT CANAL WALL DOWN TYMPANOMASTOIDECTOMY;  Surgeon: Leta Baptist, MD;  Location: [facility];  Service: ENT;  Laterality: Left;  [name] VAGINAL HYSTERECTOMY  1990    Family Psychiatric History: denies  Family History:  Family History  Problem Relation Age of Onset  . Heart disease Mother   . Diabetes Mother   . Heart disease Brother   . Cancer Sister   . Heart disease Brother   . Heart disease Brother     Social History:   Social History   Social History  . Marital status: Married    Spouse name: N/A  . Number of children: N/A  . Years of education: N/A   Social History Main Topics  . Smoking status: Never Smoker  . Smokeless tobacco: Never Used  . Alcohol use No  . Drug use: No  . Sexual activity: Not on file   Other Topics Concern  . Not on file   Social History Narrative  . No narrative on file    Additional Social History:  Grew up in [place], lives in [place] She lives with her husband of 29 years. She has three children She reports her father passed away when she was 39 years old, and has difficult relationship with her mother who was "mean." Work: on disability for four years due to seizure, used to work at [employer] for 17 years Education: 9th grade  Allergies:  No Known Allergies  Metabolic Disorder Labs: Lab Results  Component Value Date   HGBA1C 6.2 (H) 09/02/2012   MPG 131 (H) 09/02/2012   No results found for: PROLACTIN Lab Results  Component Value Date   CHOL  05/08/2007    196        ATP III CLASSIFICATION:  <200     mg/dL   Desirable  200-239  mg/dL   Borderline High  >=240    mg/dL   High   TRIG 192 (H) 05/08/2007   HDL 38 (L) 05/08/2007   CHOLHDL 5.2 05/08/2007   VLDL 38 05/08/2007   LDLCALC (H) 05/08/2007    120        Total Cholesterol/HDL:CHD Risk Coronary Heart Disease Risk Table                     Men   Women  1/2 Average Risk   3.4   3.3     Current Medications: Current Outpatient  Prescriptions  Medication Sig Dispense Refill  . acyclovir (ZOVIRAX) 800 MG tablet Take 800 mg by mouth daily.     [name] albuterol (PROVENTIL HFA;VENTOLIN HFA) 108 (90 BASE) MCG/ACT inhaler Inhale 2 puffs into the lungs every 6 (six) hours as needed for wheezing or shortness of breath. 1 Inhaler 3  . amitriptyline (ELAVIL) 50 MG tablet Take 50 mg by mouth at bedtime.    . Black Cohosh 40 MG CAPS Take by mouth daily.    . busPIRone (BUSPAR) 15 MG tablet  Take 2 tablets by mouth twice daily    . cholecalciferol (VITAMIN D) 1000 UNITS tablet Take 1,000 Units by mouth daily.    . Fluticasone Furoate-Vilanterol (BREO ELLIPTA IN) Inhale into the lungs.    . gabapentin (NEURONTIN) 100 MG capsule Take 100 mg by mouth 3 (three) times daily.    . Insulin Lispro, Human, (HUMALOG KWIKPEN [place]) Inject 2.5 Units into the skin 3 (three) times daily.     [name] lamoTRIgine (LAMICTAL) 200 MG tablet Take 200 mg by mouth 2 (two) times daily.     [name] levocetirizine (XYZAL) 5 MG tablet Take 5 mg by mouth every evening.    [name] lisinopril-hydrochlorothiazide (PRINZIDE,ZESTORETIC) 20-12.5 MG per tablet Take 1 tablet by mouth daily.     . meloxicam (MOBIC) 7.5 MG tablet Take 7.5 mg by mouth daily.    . metFORMIN (GLUCOPHAGE) 1000 MG tablet Take 1,000 mg by mouth 2 (two) times daily with a meal.    . metoprolol succinate (TOPROL XL) 50 MG 24 hr tablet Take 1 tablet (50 mg total) by mouth daily. 30 tablet 6  . omeprazole (PRILOSEC) 20 MG capsule Take 20 mg by mouth daily.    . simvastatin (ZOCOR) 20 MG tablet Take 20 mg by mouth every evening.    . venlafaxine XR (EFFEXOR-XR) 150 MG 24 hr capsule Take 150 mg by mouth daily with breakfast.     No current facility-administered medications for this visit.     Neurologic: Headache: No- has a history Seizure: Yes- has a history Paresthesias:No  Musculoskeletal: Strength & Muscle Tone: within normal limits Gait & Station: normal Patient leans: N/A  Psychiatric Specialty Exam: ROS   Blood pressure 112/71, pulse 84, height 5' 4.57" (1.64 m), weight 185 lb 3.2 oz (84 kg).Body mass index is 31.23 kg/m.  General Appearance: Fairly Groomed  Eye Contact:  Good  Speech:  Clear and Coherent  Volume:  Normal  Mood:  "good"  Affect:  Appropriate and Congruent  Thought Process:  Coherent, unable to elaborate at times  Orientation:  Full (Time, Place, and Person)   Thought Content:  no paranoia Perceptions: denies AH/VH  Suicidal Thoughts:  No  Homicidal Thoughts:  No  Memory:  Immediate;   Poor Recent;   Fair Remote;   Fair  Judgement:  Fair  Insight:  Present  Psychomotor Activity:  Normal  Concentration:  Concentration: Good and Attention Span: Good  Recall:  Fair 2/3  [employer] of Knowledge:Good  Language: Good  Akathisia:  No  Handed:  Right  AIMS (if indicated):  N/A  Assets:  Communication Skills Desire for Improvement  ADL's:  Intact  Cognition: Impaired,  Mild  Sleep:  good  Clock drawing 05/02/2016: 1/3 (wrote down number 1-7 and 12 only, unable to place hands in the clock. She placed her both hands on the paper, although she was instructed many times to draw clock hands) Delayed recall 2/3  Head CT 04/2013 IMPRESSION: 1. No acute intracranial process identified. 2. Mild chronic microvascular ischemic disease, stable relative to prior study.  Assessment Carol Richmond is a 66 year old female with a history of depression, diabetes, hyperlipidemia, headaches, non-epileptic episodes of decreased responsiveness followed by neurologist, recurrent left cholesteatoma, who is referred for "confusion."  # Unspecified neurocognitive disorder Although interview is somewhat limited given hearing impairment of both the patient and her husband, exam is notable for her immediate memory loss (repeatedly asks when she was instructed to get blood test), and impairment in executive  functioning as evident in clock drawing. Head CT in 2015 with mild chronic microvascular  ischemic disease. Will obtain labs to rule out treatable cause of dementia. Will decrease amitriptyline given patient episodes of gait imbalances and to minimize risk of anticholinergic side effects. Patient is advised to be back on original dose if it worsens her headache. She is advised to come back with one of her children to get more collaterals.  Plan 1. Decrease amitriptyline 25 mg at night. Please go back to 50 mg at night if it worsens your headache. 2. Continue Effexor 150 mg daily 3. Return to clinic in one month 4. Obtain blood test (TSH, LFT, CBC, Vitamin B12, folate)  The patient demonstrates the following risk factors for suicide: Chronic risk factors for suicide include: psychiatric disorder of depression. Acute risk factors for suicide include: unemployment. Protective factors for this patient include: positive social support and hope for the future. Considering these factors, the overall suicide risk at this point appears to be low. Patient is appropriate for outpatient follow up.   Treatment Plan Summary: Plan as above   Norman Clay, MD 3/15/20189:35 AM

## 2016-05-02 ENCOUNTER — Telehealth: Payer: Self-pay | Admitting: *Deleted

## 2016-05-02 ENCOUNTER — Ambulatory Visit (INDEPENDENT_AMBULATORY_CARE_PROVIDER_SITE_OTHER): Payer: PPO | Admitting: Psychiatry

## 2016-05-02 ENCOUNTER — Ambulatory Visit (HOSPITAL_COMMUNITY): Payer: Self-pay | Admitting: Psychiatry

## 2016-05-02 VITALS — BP 112/71 | HR 84 | Ht 64.57 in | Wt 185.2 lb

## 2016-05-02 DIAGNOSIS — R29818 Other symptoms and signs involving the nervous system: Secondary | ICD-10-CM

## 2016-05-02 DIAGNOSIS — R4189 Other symptoms and signs involving cognitive functions and awareness: Secondary | ICD-10-CM | POA: Diagnosis not present

## 2016-05-02 DIAGNOSIS — G3184 Mild cognitive impairment, so stated: Secondary | ICD-10-CM

## 2016-05-02 DIAGNOSIS — Z79899 Other long term (current) drug therapy: Secondary | ICD-10-CM

## 2016-05-02 LAB — HEPATIC FUNCTION PANEL
ALT: 17 U/L (ref 6–29)
AST: 26 U/L (ref 10–35)
Albumin: 3.5 g/dL — ABNORMAL LOW (ref 3.6–5.1)
Alkaline Phosphatase: 113 U/L (ref 33–130)
Bilirubin, Direct: 0.1 mg/dL (ref <=0.2)
Indirect Bilirubin: 0.2 mg/dL (ref 0.2–1.2)
Total Bilirubin: 0.3 mg/dL (ref 0.2–1.2)
Total Protein: 6.7 g/dL (ref 6.1–8.1)

## 2016-05-02 LAB — CBC
HEMATOCRIT: 31.1 % — AB (ref 35.0–45.0)
Hemoglobin: 10 g/dL — ABNORMAL LOW (ref 11.7–15.5)
MCH: 26.2 pg — ABNORMAL LOW (ref 27.0–33.0)
MCHC: 32.2 g/dL (ref 32.0–36.0)
MCV: 81.6 fL (ref 80.0–100.0)
MPV: 8.6 fL (ref 7.5–12.5)
PLATELETS: 239 10*3/uL (ref 140–400)
RBC: 3.81 MIL/uL (ref 3.80–5.10)
RDW: 17 % — AB (ref 11.0–15.0)
WBC: 8 10*3/uL (ref 3.8–10.8)

## 2016-05-02 LAB — FOLATE

## 2016-05-02 LAB — VITAMIN B12: VITAMIN B 12: 308 pg/mL (ref 200–1100)

## 2016-05-02 LAB — TSH: TSH: 1.33 m[IU]/L

## 2016-05-02 NOTE — Telephone Encounter (Signed)
Notes recorded by Laurine Blazer, LPN on 3/42/8768 at 1:15 AM EDT Left message to return call.  ------  Notes Recorded by Herminio Commons, MD on 05/01/2016 at 12:11 PM EDT Normal pumping function, mild narrowing of mitral valve opening with some valve leakage.

## 2016-05-02 NOTE — Patient Instructions (Addendum)
1. Decrease amitriptyline 25 mg at night. Please go back to 50 mg at night if it worsens your headache. 2. Continue Effexor 150 mg daily 3. Return to clinic in one month 4. Obtain blood test

## 2016-05-03 NOTE — Telephone Encounter (Signed)
Notes recorded by Laurine Blazer, LPN on 4/94/4739 at 5:84 PM EDT Patient notified. Copy to pmd.

## 2016-05-21 DIAGNOSIS — H18413 Arcus senilis, bilateral: Secondary | ICD-10-CM | POA: Diagnosis not present

## 2016-05-21 DIAGNOSIS — H43393 Other vitreous opacities, bilateral: Secondary | ICD-10-CM | POA: Diagnosis not present

## 2016-05-21 DIAGNOSIS — H3561 Retinal hemorrhage, right eye: Secondary | ICD-10-CM | POA: Diagnosis not present

## 2016-05-21 DIAGNOSIS — H25013 Cortical age-related cataract, bilateral: Secondary | ICD-10-CM | POA: Diagnosis not present

## 2016-05-21 DIAGNOSIS — H2513 Age-related nuclear cataract, bilateral: Secondary | ICD-10-CM | POA: Diagnosis not present

## 2016-05-21 DIAGNOSIS — H35033 Hypertensive retinopathy, bilateral: Secondary | ICD-10-CM | POA: Diagnosis not present

## 2016-05-21 DIAGNOSIS — E083291 Diabetes mellitus due to underlying condition with mild nonproliferative diabetic retinopathy without macular edema, right eye: Secondary | ICD-10-CM | POA: Diagnosis not present

## 2016-05-21 DIAGNOSIS — H04123 Dry eye syndrome of bilateral lacrimal glands: Secondary | ICD-10-CM | POA: Diagnosis not present

## 2016-05-21 DIAGNOSIS — H16223 Keratoconjunctivitis sicca, not specified as Sjogren's, bilateral: Secondary | ICD-10-CM | POA: Diagnosis not present

## 2016-05-21 DIAGNOSIS — H25813 Combined forms of age-related cataract, bilateral: Secondary | ICD-10-CM | POA: Diagnosis not present

## 2016-05-28 NOTE — Progress Notes (Signed)
BH MD/PA/NP OP Progress Note  05/31/2016 11:14 AM Carol Richmond  MRN:  [id number]  Chief Complaint:  Chief Complaint    Follow-up; Other     Subjective:  "I'm doing alright" HPI:  Patient presents for follow up appointment. She states that she has no changes since the last appointment. She feels "alright". She denies irritability and memory loss, although she is aware that her husband reports issues with those. She denies SI, AH/VH.   Her husband presents to the interview.  He believes that her memory is getting worse. She is unable to remember what she said a few minutes ago. He denies any behavioral issues. He does cooking most of the time. She does grocery shopping without any issues, while using a credit card. She takes care of her medication. She has no issues with ADL.   Per chart review, she was evaluated by Carol Richmond, neurologist 12/01/2015.  Diagnosis including headaches and non epileptic episodes, speech difficulties likely due to anxiety. Delayed recall 3/3 per chart. "I personally reviewed MRI brain without contrast which did not show any acute changes, there was moderate chronic white matter microvascular disease, hippocampi symmetric with no abnormal signal seen. Her 24-hour EEG was mildly abnormal due to mild diffuse slowing of the background, no epileptiform discharges or electrographic seizures seen."   Visit Diagnosis:    ICD-9-CM ICD-10-CM   1. Mild neurocognitive disorder 331.83 G31.84     Past Psychiatric History:  Outpatient: used to see Dr. Sima Richmond for "same thing" Psychiatry admission: denies Previous suicide attempt: denies Past trials of medication: lamotrigine 200 mg daily, amitriptyline 50 mg at night, buspirone 15 mg BID History of violence: denies  Past Medical History:  Past Medical History:  Diagnosis Date  . Asthma   . Diabetes mellitus without complication ([place])   . Diarrhea   . Diverticulosis of colon (without mention of  hemorrhage)   . Functional ovarian cysts   . GERD (gastroesophageal reflux disease)   . Hemorrhoids, external, without mention of complication   . Hormone replacement therapy (postmenopausal)   . Hypertension   . IBS (irritable bowel syndrome)   . Migraine   . Right knee DJD   . Seizures ([place])   . Shortness of breath   . Sleep apnea    no cpap    study 2 yrs ago    Past Surgical History:  Procedure Laterality Date  . CHOLECYSTECTOMY  1980  . LEFT HEART CATHETERIZATION WITH CORONARY ANGIOGRAM N/A 11/18/2013   Procedure: LEFT HEART CATHETERIZATION WITH CORONARY ANGIOGRAM;  Surgeon: Carol Blanks, MD;  Location: [facility] CATH LAB;  Service: Cardiovascular;  Laterality: N/A;  . OOPHORECTOMY     2012 Dr. Adah Richmond in [place]  . TONSILLECTOMY    . TOTAL KNEE ARTHROPLASTY Right 09/02/2012   Procedure: TOTAL KNEE ARTHROPLASTY- right;  Surgeon: Carol Lights, MD;  Location: [facility];  Service: Orthopedics;  Laterality: Right;  . TUBAL LIGATION    . TYMPANOMASTOIDECTOMY Left 04/02/2016   Procedure: LEFT CANAL WALL DOWN TYMPANOMASTOIDECTOMY;  Surgeon: Carol Baptist, MD;  Location: [facility];  Service: ENT;  Laterality: Left;  [name] VAGINAL HYSTERECTOMY  1990    Family Psychiatric History: denies Denies history of dementia of Parkinson's   Family History:  Family History  Problem Relation Age of Onset  . Heart disease Mother   . Diabetes Mother   . Heart disease Brother   . Cancer Sister   . Heart disease Brother   .  Heart disease Brother     Social History:  Social History   Social History  . Marital status: Married    Spouse name: N/A  . Number of children: N/A  . Years of education: N/A   Social History Main Topics  . Smoking status: Never Smoker  . Smokeless tobacco: Never Used  . Alcohol use No  . Drug use: No  . Sexual activity: Not Asked   Other Topics Concern  . None   Social History Narrative  . None   Grew up in [place], lives in [place] She lives  with her husband of 12 years. She has three children She reports her father passed away when she was 18 years old, and has difficult relationship with her mother who was "mean." Work: on disability for four years due to seizure, used to work at [employer] for 17 years Education: 9th grade  Allergies: No Known Allergies  Metabolic Disorder Labs: Lab Results  Component Value Date   HGBA1C 6.2 (H) 09/02/2012   MPG 131 (H) 09/02/2012   No results found for: PROLACTIN Lab Results  Component Value Date   CHOL  05/08/2007    196        ATP III CLASSIFICATION:  <200     mg/dL   Desirable  200-239  mg/dL   Borderline High  >=240    mg/dL   High   TRIG 192 (H) 05/08/2007   HDL 38 (L) 05/08/2007   CHOLHDL 5.2 05/08/2007   VLDL 38 05/08/2007   LDLCALC (H) 05/08/2007    120        Total Cholesterol/HDL:CHD Risk Coronary Heart Disease Risk Table                     Men   Women  1/2 Average Risk   3.4   3.3    Lab Results  Component Value Date   TSH 1.33 05/02/2016   Lab Results  Component Value Date   FOLATE >24.0 05/02/2016   Lab Results  Component Value Date   VITAMINB12 308 05/02/2016    Current Medications: Current Outpatient Prescriptions  Medication Sig Dispense Refill  . acyclovir (ZOVIRAX) 800 MG tablet Take 800 mg by mouth daily.     [name] albuterol (PROVENTIL HFA;VENTOLIN HFA) 108 (90 BASE) MCG/ACT inhaler Inhale 2 puffs into the lungs every 6 (six) hours as needed for wheezing or shortness of breath. 1 Inhaler 3  . amitriptyline (ELAVIL) 50 MG tablet Take 50 mg by mouth at bedtime.    . Black Cohosh 40 MG CAPS Take by mouth daily.    . busPIRone (BUSPAR) 15 MG tablet Take 2 tablets by mouth twice daily    . cholecalciferol (VITAMIN D) 1000 UNITS tablet Take 1,000 Units by mouth daily.    . Fluticasone Furoate-Vilanterol (BREO ELLIPTA IN) Inhale into the lungs.    . gabapentin (NEURONTIN) 100 MG capsule Take 100 mg by mouth 3 (three) times daily.    . Insulin Lispro,  Human, (HUMALOG KWIKPEN [place]) Inject 2.5 Units into the skin 3 (three) times daily.     [name] lamoTRIgine (LAMICTAL) 200 MG tablet Take 200 mg by mouth 2 (two) times daily.     [name] lisinopril-hydrochlorothiazide (PRINZIDE,ZESTORETIC) 20-12.5 MG per tablet Take 1 tablet by mouth daily.     . meloxicam (MOBIC) 7.5 MG tablet Take 7.5 mg by mouth daily.    . metFORMIN (GLUCOPHAGE) 1000 MG tablet Take 1,000 mg by mouth 2 (two)  times daily with a meal.    . metoprolol succinate (TOPROL XL) 50 MG 24 hr tablet Take 1 tablet (50 mg total) by mouth daily. 30 tablet 6  . omeprazole (PRILOSEC) 20 MG capsule Take 20 mg by mouth daily.    . simvastatin (ZOCOR) 20 MG tablet Take 20 mg by mouth every evening.    . venlafaxine XR (EFFEXOR-XR) 150 MG 24 hr capsule Take 150 mg by mouth daily with breakfast.    . donepezil (ARICEPT) 5 MG tablet Take 1 tablet (5 mg total) by mouth at bedtime. 30 tablet 1  . levocetirizine (XYZAL) 5 MG tablet Take 5 mg by mouth every evening.     No current facility-administered medications for this visit.     Neurologic: Headache: No Seizure: No Paresthesias: No  Musculoskeletal: Strength & Muscle Tone: within normal limits Gait & Station: normal Patient leans: N/A  Psychiatric Specialty Exam: Review of Systems  Neurological: Positive for headaches. Negative for tremors.  Psychiatric/Behavioral: Positive for memory loss. Negative for depression, hallucinations, substance abuse and suicidal ideas. The patient is not nervous/anxious and does not have insomnia.   All other systems reviewed and are negative.   Blood pressure 98/62, pulse 78, height 5' 4.57" (1.64 m), weight 186 lb 12.8 oz (84.7 kg), SpO2 95 %.Body mass index is 31.5 kg/m.  General Appearance: Fairly Groomed  Eye Contact:  Good  Speech:  Clear and Coherent  Volume:  Normal  Mood:  "alright"  Affect:  euthymic, laugh at times when she is asked questions, became tearful while doing MOCA  Thought Process:   Coherent and Linear, tangential at times, likely due to her cognitive impairment  Orientation:  Full (Time, Place, and Person) except "building" (unable to tell the nature of the clinic)  Thought Content: no paranoia  becomes vague in content  Suicidal Thoughts:  No  Homicidal Thoughts:  No  Memory:  Immediate;   Good Recent;   Fair Remote;   Poor  Judgement:  Fair  Insight:  Shallow  Psychomotor Activity:  Normal  Concentration:  Concentration: Good and Attention Span: Good  Recall:  [place] of Knowledge: Good  Language: Good  Akathisia:  No  Handed:  Right  AIMS (if indicated):  N/A + postural tremors, no resting tremors or rigidity  Assets:  Communication Skills Desire for Improvement  ADL's:  Intact  Cognition: WNL  Sleep:  good   Clock drawing 05/02/2016: 1/3 (wrote down number 1-7 and 12 only, unable to place hands in the clock. She placed her both hands on the paper, although she was instructed many times to draw clock hands) Delayed recall 2/3  [facility] 05/31/2016 8/30 (+1 for clock. Unable to write down clock hands and placed numbers in wrong position, +1 for naming, +1 for attention, +5 for orientation)  Head CT 04/2013 IMPRESSION: 1. No acute intracranial process identified. 2. Mild chronic microvascular ischemic disease, stable relative to prior study.  Assessment Carol Richmond is a 66 year old female with a history of depression, diabetes, hyperlipidemia, headaches, non-epileptic episodes of decreased responsiveness followed by neurologist, recurrent left cholesteatoma, who presents here for follow up appointment for cognitive impairment.   # Mild neurocognitive disorder She is relatively a poor historian and is unable to elaborate her story. Although she is pleasant and calm during the entire interview, she laughs at times when she is given instruction or asked questions. These and reported irritability are likely due to cognitive disorder, which was also  evident on MOCA score as above. Although the score is extremely low, her husband denies any issues with IADL (although he does cooking most of the time). They are encouraged to bring one of her children for more collaterals. Labs with no significant abnormality for treatable cause of dementia; she is instructed to see her PCP for anemia. Last CT/MRI was taken a few years ago with microvascular ischemic change. May consider retake MRI for better understanding regarding the cause of dementia. She has no neurological signs except postural tremors or hallucinations to concern for Lewy body dementia. Will start Aricept for her neurocognitive disorder. Discussed risk of nausea or vomiting. Noted that patient is not amenable to lower amitriptyline for headache, although it may contribute to her cognitive impairment (to certain degree).   Plan 1. Start Aricept 5 mg daily 2. Continue Effexor 150 mg daily 4. Return to clinic in one month for 30 mins, please bring one of your children if able 5. Patient is reminded to bring her medication at the next visit  6. Patient is advised to contact her primary care for anemia  The patient demonstrates the following risk factors for suicide: Chronic risk factors for suicide include: psychiatric disorder of depression. Acute risk factors for suicide include: unemployment. Protective factors for this patient include: positive social support and hope for the future. Considering these factors, the overall suicide risk at this point appears to be low. Patient is appropriate for outpatient follow up.   Treatment Plan Summary: Plan as above   Norman Clay, MD 05/31/2016, 11:14 AM

## 2016-05-31 ENCOUNTER — Encounter (HOSPITAL_COMMUNITY): Payer: Self-pay | Admitting: Psychiatry

## 2016-05-31 ENCOUNTER — Ambulatory Visit (INDEPENDENT_AMBULATORY_CARE_PROVIDER_SITE_OTHER): Payer: PPO | Admitting: Psychiatry

## 2016-05-31 VITALS — BP 98/62 | HR 78 | Ht 64.57 in | Wt 186.8 lb

## 2016-05-31 DIAGNOSIS — G3184 Mild cognitive impairment, so stated: Secondary | ICD-10-CM | POA: Diagnosis not present

## 2016-05-31 DIAGNOSIS — Z79899 Other long term (current) drug therapy: Secondary | ICD-10-CM

## 2016-05-31 MED ORDER — DONEPEZIL HCL 5 MG PO TABS: 5.0000 mg | ORAL_TABLET | Freq: Every day | ORAL | 1 refills | Status: DC

## 2016-05-31 NOTE — Patient Instructions (Addendum)
1. Start Aricept 5 mg daily 2. Continue Effexor 150 mg daily 3. Start Aricept 5 mg daily 4. Return to clinic in one month for 30 mins, please bring one of your children if able 5. Bring your medication at the next visit 6. Check in with your primary care for anemia

## 2016-06-17 DIAGNOSIS — Z6831 Body mass index (BMI) 31.0-31.9, adult: Secondary | ICD-10-CM | POA: Diagnosis not present

## 2016-06-17 DIAGNOSIS — K219 Gastro-esophageal reflux disease without esophagitis: Secondary | ICD-10-CM | POA: Diagnosis not present

## 2016-06-17 DIAGNOSIS — I10 Essential (primary) hypertension: Secondary | ICD-10-CM | POA: Diagnosis not present

## 2016-06-17 DIAGNOSIS — R4189 Other symptoms and signs involving cognitive functions and awareness: Secondary | ICD-10-CM | POA: Diagnosis not present

## 2016-06-17 DIAGNOSIS — E113493 Type 2 diabetes mellitus with severe nonproliferative diabetic retinopathy without macular edema, bilateral: Secondary | ICD-10-CM | POA: Diagnosis not present

## 2016-06-26 NOTE — Progress Notes (Signed)
[place] MD/PA/NP OP Progress Note  07/01/2016 8:57 AM SEYNABOU FULTS  MRN:  [id number]  Chief Complaint:  Chief Complaint    Follow-up; Memory Loss     Subjective:  "I'm depressed" HPI:  Patient presents for follow up appointment. She states that she has had no change since the last appointment. She feels worried about her husband, and talks about an episode she was scared that he might not wake up in the next morning. She feels tense all the time and feels depressed at times.She denies difficulty in concentration. She denies being irritable, although she sometimes get "hateful." She denies any seizure like episode since the last time she saw her neurologist. She denies insomnia, SI, AH/VH. She has occasional memory loss, although she cannot elaborate it.   Her daughter presents to the interview.  Patient appears to have more mood swing these days. She constantly worries about her grandchild, her son and her other daughter, although it is beyond her control. She appears to have speech difficulty as she appears to forget things she was going to say. Her daughter denies other concern for memory loss. She denies any concern for IADL/ADL.   Per chart review, she was evaluated by Dr. Ellouise Newer, neurologist 12/01/2015.  Diagnosis including headaches and non epileptic episodes, speech difficulties likely due to anxiety. Delayed recall 3/3 per chart. "I personally reviewed MRI brain without contrast which did not show any acute changes, there was moderate chronic white matter microvascular disease, hippocampi symmetric with no abnormal signal seen. Her 24-hour EEG was mildly abnormal due to mild diffuse slowing of the background, no epileptiform discharges or electrographic seizures seen."   Visit Diagnosis:    ICD-9-CM ICD-10-CM   1. Anxiety state 300.00 F41.1   2. Neurocognitive deficits 781.99 R29.818     R41.89     Past Psychiatric History:  Outpatient: used to see Dr. Sima Matas for "same  thing" Psychiatry admission: denies Previous suicide attempt: denies Past trials of medication: lamotrigine 200 mg daily, amitriptyline 50 mg at night, buspirone 15 mg BID History of violence: denies  Past Medical History:  Past Medical History:  Diagnosis Date  . Asthma   . Diabetes mellitus without complication ([place])   . Diarrhea   . Diverticulosis of colon (without mention of hemorrhage)   . Functional ovarian cysts   . GERD (gastroesophageal reflux disease)   . Hemorrhoids, external, without mention of complication   . Hormone replacement therapy (postmenopausal)   . Hypertension   . IBS (irritable bowel syndrome)   . Migraine   . Right knee DJD   . Seizures ([place])   . Shortness of breath   . Sleep apnea    no cpap    study 2 yrs ago    Past Surgical History:  Procedure Laterality Date  . CHOLECYSTECTOMY  1980  . LEFT HEART CATHETERIZATION WITH CORONARY ANGIOGRAM N/A 11/18/2013   Procedure: LEFT HEART CATHETERIZATION WITH CORONARY ANGIOGRAM;  Surgeon: Burnell Blanks, MD;  Location: [facility] CATH LAB;  Service: Cardiovascular;  Laterality: N/A;  . OOPHORECTOMY     2012 Dr. Adah Perl in [place]  . TONSILLECTOMY    . TOTAL KNEE ARTHROPLASTY Right 09/02/2012   Procedure: TOTAL KNEE ARTHROPLASTY- right;  Surgeon: Ninetta Lights, MD;  Location: [facility];  Service: Orthopedics;  Laterality: Right;  . TUBAL LIGATION    . TYMPANOMASTOIDECTOMY Left 04/02/2016   Procedure: LEFT CANAL WALL DOWN TYMPANOMASTOIDECTOMY;  Surgeon: Leta Baptist, MD;  Location: [facility];  Service: ENT;  Laterality: Left;  [name] VAGINAL HYSTERECTOMY  1990    Family Psychiatric History: denies Denies history of dementia of Parkinson's   Family History:  Family History  Problem Relation Age of Onset  . Heart disease Mother   . Diabetes Mother   . Heart disease Brother   . Cancer Sister   . Heart disease Brother   . Heart disease Brother     Social History:  Social History   Social History  .  Marital status: Married    Spouse name: N/A  . Number of children: N/A  . Years of education: N/A   Social History Main Topics  . Smoking status: Never Smoker  . Smokeless tobacco: Never Used  . Alcohol use No  . Drug use: No  . Sexual activity: Not Asked   Other Topics Concern  . None   Social History Narrative  . None   Grew up in [place], lives in [place] She lives with her husband of 32 years. She has three children She reports her father passed away when she was 26 years old, and has difficult relationship with her mother who was "mean." Work: on disability for four years due to seizure, used to work at [employer] for 17 years Education: 9th grade  Allergies: No Known Allergies  Metabolic Disorder Labs: Lab Results  Component Value Date   HGBA1C 6.2 (H) 09/02/2012   MPG 131 (H) 09/02/2012   No results found for: PROLACTIN Lab Results  Component Value Date   CHOL  05/08/2007    196        ATP III CLASSIFICATION:  <200     mg/dL   Desirable  200-239  mg/dL   Borderline High  >=240    mg/dL   High   TRIG 192 (H) 05/08/2007   HDL 38 (L) 05/08/2007   CHOLHDL 5.2 05/08/2007   VLDL 38 05/08/2007   LDLCALC (H) 05/08/2007    120        Total Cholesterol/HDL:CHD Risk Coronary Heart Disease Risk Table                     Men   Women  1/2 Average Risk   3.4   3.3    Lab Results  Component Value Date   TSH 1.33 05/02/2016   Lab Results  Component Value Date   FOLATE >24.0 05/02/2016   Lab Results  Component Value Date   VITAMINB12 308 05/02/2016    Current Medications: Current Outpatient Prescriptions  Medication Sig Dispense Refill  . acyclovir (ZOVIRAX) 800 MG tablet Take 800 mg by mouth daily.     [name] albuterol (PROVENTIL HFA;VENTOLIN HFA) 108 (90 BASE) MCG/ACT inhaler Inhale 2 puffs into the lungs every 6 (six) hours as needed for wheezing or shortness of breath. 1 Inhaler 3  . amitriptyline (ELAVIL) 50 MG tablet Take 50 mg by mouth at bedtime.    .  Black Cohosh 40 MG CAPS Take by mouth daily.    . busPIRone (BUSPAR) 15 MG tablet Take 2 tablets by mouth twice daily    . cholecalciferol (VITAMIN D) 1000 UNITS tablet Take 1,000 Units by mouth daily.    [name] donepezil (ARICEPT) 5 MG tablet Take 1 tablet (5 mg total) by mouth at bedtime. 30 tablet 1  . Fluticasone Furoate-Vilanterol (BREO ELLIPTA IN) Inhale into the lungs.    . gabapentin (NEURONTIN) 100 MG capsule Take 100 mg by mouth 3 (three) times daily.    [name]  Insulin Lispro, Human, (HUMALOG KWIKPEN [place]) Inject 2.5 Units into the skin 3 (three) times daily.     [name] lamoTRIgine (LAMICTAL) 200 MG tablet Take 200 mg by mouth 2 (two) times daily.     [name] levocetirizine (XYZAL) 5 MG tablet Take 5 mg by mouth every evening.    [name] lisinopril-hydrochlorothiazide (PRINZIDE,ZESTORETIC) 20-12.5 MG per tablet Take 1 tablet by mouth daily.     . meloxicam (MOBIC) 7.5 MG tablet Take 7.5 mg by mouth daily.    . metFORMIN (GLUCOPHAGE) 1000 MG tablet Take 1,000 mg by mouth 2 (two) times daily with a meal.    . metoprolol succinate (TOPROL XL) 50 MG 24 hr tablet Take 1 tablet (50 mg total) by mouth daily. 30 tablet 6  . omeprazole (PRILOSEC) 20 MG capsule Take 20 mg by mouth daily.    . simvastatin (ZOCOR) 20 MG tablet Take 20 mg by mouth every evening.    . venlafaxine XR (EFFEXOR-XR) 37.5 MG 24 hr capsule Take total of 187.5 mg daily (150 mg + 37.5 mg) 30 capsule 1   No current facility-administered medications for this visit.     Neurologic: Headache: No Seizure: No Paresthesias: No  Musculoskeletal: Strength & Muscle Tone: within normal limits Gait & Station: normal Patient leans: N/A  Psychiatric Specialty Exam: Review of Systems  Neurological: Positive for headaches. Negative for tremors.  Psychiatric/Behavioral: Positive for depression and memory loss. Negative for hallucinations, substance abuse and suicidal ideas. The patient is nervous/anxious. The patient does not have insomnia.   All other  systems reviewed and are negative.   Blood pressure 121/69, pulse 77, height 5' 4.57" (1.64 m), weight 186 lb (84.4 kg).Body mass index is 31.37 kg/m.  General Appearance: Fairly Groomed  Eye Contact:  Good  Speech:  Clear and Coherent  Volume:  Normal  Mood:  "fine"  Affect:  Appropriate, Congruent and slightly restricted  Thought Process:  Coherent and Linear  Orientation:  Full (Time, Place, and Person)   Thought Content: no paranoia  Perceptions: denies AH/VH  Suicidal Thoughts:  No  Homicidal Thoughts:  No  Memory:  Immediate;   Good Recent;   Fair Remote;   Poor  Judgement:  Fair  Insight:  Shallow  Psychomotor Activity:  Normal  Concentration:  Concentration: Good and Attention Span: Good  Recall:  [place] of Knowledge: Good  Language: Good  Akathisia:  No  Handed:  Right  AIMS (if indicated):  N/A + postural tremors, no resting tremors or rigidity  Assets:  Communication Skills Desire for Improvement  ADL's:  Intact  Cognition: WNL  Sleep:  good   Clock drawing 05/02/2016: 1/3 (wrote down number 1-7 and 12 only, unable to place hands in the clock. She placed her both hands on the paper, although she was instructed many times to draw clock hands) Delayed recall 2/3  [facility] 05/31/2016 8/30 (+1 for clock. Unable to write down clock hands and placed numbers in wrong position, +1 for naming, +1 for attention, +5 for orientation)  Head CT 04/2013 IMPRESSION: 1. No acute intracranial process identified. 2. Mild chronic microvascular ischemic disease, stable relative to prior study.  Assessment NOHEALANI MEDINGER is a 66 year old female with a history of depression, diabetes, hyperlipidemia, headaches, non-epileptic episodes of decreased responsiveness followed by neurologist, recurrent left cholesteatoma, who presents here for follow up appointment for cognitive impairment.    # Unspecified anxiety disorder # r/o GAD Patient appears to have some anxiety symptoms  at baseline and endorsed on concern about her family members based on collateral, although she appears to have difficulty in elaborate it at the interview. Will increase Effexor to target her anxiety. May consider taper off buspirone/gabapentin in the future given its limited effectiveness and to avoid polypharmacy. Noted that she is no lamotrigine for "mood stabilizer" prescribed by other provider, and not for seizure. Will continue the same dose at this time, given both patient and her daughter's concern about its effect on seizure.   # Mild neurocognitive disorder It is very difficult to discern whether her cognitive impairment is secondary to anxiety or dementia. Collaterals from her husband and daughter both reports she has full ADL/IADL (except they would not let her drive); while she has low score on MOCA. This may support more for anxiety; will treat anxiety first as above and will continue to monitor it. Labs with no significant abnormality for treatable cause of dementia; she is instructed to see her PCP for anemia. Last CT/MRI was taken a few years ago with microvascular ischemic change. She has no neurological signs except postural tremors or hallucinations to concern for Lewy body dementia. Will continue Aricept at this time for neurological deficit.  Discussed risk of nausea or vomiting. Although amitriptyline can cause cognitive deficits, will continue the current dose given it is effective for her headache.  Plan 1. Continue Aricept 5 mg daily 2. Increase Effexor 187.5 mg daily (37.5 mg +150 mg ) 3. Return to clinic in one month  4. Please discuss about your anemia with your primary care 5. Patient is advised to contact her primary care for anemia (Patient is on buspirone 15 mg BID, gabapentin 100 mg TID, lamotrigine 200 mg BID prescribed by her other provider)  The patient demonstrates the following risk factors for suicide: Chronic risk factors for suicide include: psychiatric  disorder of depression. Acute risk factors for suicide include: unemployment. Protective factors for this patient include: positive social support and hope for the future. Considering these factors, the overall suicide risk at this point appears to be low. Patient is appropriate for outpatient follow up.   Treatment Plan Summary: Plan as above  The duration of this appointment visit was 30 minutes of face-to-face time with the patient.  Greater than 50% of this time was spent in counseling, explanation of  diagnosis, planning of further management, and coordination of care.  Norman Clay, MD 07/01/2016, 8:57 AM

## 2016-06-27 DIAGNOSIS — I6529 Occlusion and stenosis of unspecified carotid artery: Secondary | ICD-10-CM | POA: Diagnosis not present

## 2016-06-27 DIAGNOSIS — I6523 Occlusion and stenosis of bilateral carotid arteries: Secondary | ICD-10-CM | POA: Diagnosis not present

## 2016-07-01 ENCOUNTER — Ambulatory Visit (INDEPENDENT_AMBULATORY_CARE_PROVIDER_SITE_OTHER): Payer: PPO | Admitting: Psychiatry

## 2016-07-01 ENCOUNTER — Encounter (HOSPITAL_COMMUNITY): Payer: Self-pay | Admitting: Psychiatry

## 2016-07-01 VITALS — BP 121/69 | HR 77 | Ht 64.57 in | Wt 186.0 lb

## 2016-07-01 DIAGNOSIS — F411 Generalized anxiety disorder: Secondary | ICD-10-CM | POA: Diagnosis not present

## 2016-07-01 DIAGNOSIS — Z79899 Other long term (current) drug therapy: Secondary | ICD-10-CM

## 2016-07-01 DIAGNOSIS — R4189 Other symptoms and signs involving cognitive functions and awareness: Secondary | ICD-10-CM | POA: Diagnosis not present

## 2016-07-01 DIAGNOSIS — R29818 Other symptoms and signs involving the nervous system: Secondary | ICD-10-CM | POA: Diagnosis not present

## 2016-07-01 MED ORDER — DONEPEZIL HCL 5 MG PO TABS: 5.0000 mg | ORAL_TABLET | Freq: Every day | ORAL | 1 refills | Status: DC

## 2016-07-01 MED ORDER — VENLAFAXINE HCL ER 37.5 MG PO CP24: ORAL_CAPSULE | ORAL | 1 refills | Status: DC

## 2016-07-01 NOTE — Patient Instructions (Addendum)
1. Continue Aricept 5 mg daily 2. Increase Effexor 187.5 mg daily (37.5 mg +150 mg ) 4. Return to clinic in one month  5. Please discuss about your anemia with your primary care

## 2016-07-04 DIAGNOSIS — H43821 Vitreomacular adhesion, right eye: Secondary | ICD-10-CM | POA: Diagnosis not present

## 2016-07-04 DIAGNOSIS — E119 Type 2 diabetes mellitus without complications: Secondary | ICD-10-CM | POA: Diagnosis not present

## 2016-07-04 DIAGNOSIS — H35041 Retinal micro-aneurysms, unspecified, right eye: Secondary | ICD-10-CM | POA: Diagnosis not present

## 2016-07-04 DIAGNOSIS — H3561 Retinal hemorrhage, right eye: Secondary | ICD-10-CM | POA: Diagnosis not present

## 2016-07-04 DIAGNOSIS — H3582 Retinal ischemia: Secondary | ICD-10-CM | POA: Diagnosis not present

## 2016-07-06 ENCOUNTER — Ambulatory Visit (INDEPENDENT_AMBULATORY_CARE_PROVIDER_SITE_OTHER): Payer: Self-pay | Admitting: Otolaryngology

## 2016-07-08 ENCOUNTER — Ambulatory Visit (INDEPENDENT_AMBULATORY_CARE_PROVIDER_SITE_OTHER): Payer: PPO | Admitting: Otolaryngology

## 2016-07-08 DIAGNOSIS — H2511 Age-related nuclear cataract, right eye: Secondary | ICD-10-CM | POA: Diagnosis not present

## 2016-07-08 DIAGNOSIS — H95122 Granulation of postmastoidectomy cavity, left ear: Secondary | ICD-10-CM

## 2016-07-08 DIAGNOSIS — H3561 Retinal hemorrhage, right eye: Secondary | ICD-10-CM | POA: Diagnosis not present

## 2016-07-08 DIAGNOSIS — H3582 Retinal ischemia: Secondary | ICD-10-CM | POA: Diagnosis not present

## 2016-07-11 DIAGNOSIS — Z6831 Body mass index (BMI) 31.0-31.9, adult: Secondary | ICD-10-CM | POA: Diagnosis not present

## 2016-07-11 DIAGNOSIS — J208 Acute bronchitis due to other specified organisms: Secondary | ICD-10-CM | POA: Diagnosis not present

## 2016-07-18 DIAGNOSIS — H3582 Retinal ischemia: Secondary | ICD-10-CM | POA: Diagnosis not present

## 2016-07-18 DIAGNOSIS — H348111 Central retinal vein occlusion, right eye, with retinal neovascularization: Secondary | ICD-10-CM | POA: Diagnosis not present

## 2016-07-24 NOTE — Progress Notes (Signed)
[facility] [facility] MD/PA/NP OP Progress Note  07/30/2016 12:10 PM Carol Richmond  MRN:  [id number]  Chief Complaint:  Chief Complaint    Depression; Follow-up     Subjective:  "I'm hateful" HPI:  Patient presents for follow up appointment. She states that she has been "hateful" for the last few days; she feels that her husband is "harrowing at me" while her left ear with decreased hearing. She tends to sit in the house all the times, and feels frustrated that people would not come to her place to take her out of the house. 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 She states that she "spend too much time together (with her husband)"; when she is asked to elaborate it, she talks about the time she cleans the house. She feels depressed at times and have crying spells. She denies SI. She feels less anxious. She denies panic attacks. She denies AH/VH, paranoia. Although she went to PCP, she did not discuss about her anemia as advised at the prior visit. Patient complains of headache.   Per chart review, she was evaluated by Dr. Ellouise Newer, neurologist 12/01/2015.  Diagnosis including headaches and non epileptic episodes, speech difficulties likely due to anxiety. Delayed recall 3/3 per chart. "I personally reviewed MRI brain without contrast which did not show any acute changes, there was moderate chronic white matter microvascular disease, hippocampi symmetric with no abnormal signal seen. Her 24-hour EEG was mildly abnormal due to mild diffuse slowing of the background, no epileptiform discharges or electrographic seizures seen."   Visit Diagnosis:    ICD-10-CM   1. Anxiety state F41.1   2. Mild neurocognitive disorder G31.84     Past Psychiatric History:  Outpatient: used to see Dr. Sima Matas for "same thing" Psychiatry admission: denies Previous suicide attempt: denies Past trials of medication: lamotrigine 200 mg daily, amitriptyline 50 mg at  night, buspirone 15 mg BID History of violence: denies  Past Medical History:  Past Medical History:  Diagnosis Date  . Asthma   . Diabetes mellitus without complication ([place])   . Diarrhea   . Diverticulosis of colon (without mention of hemorrhage)   . Functional ovarian cysts   . GERD (gastroesophageal reflux disease)   . Hemorrhoids, external, without mention of complication   . Hormone replacement therapy (postmenopausal)   . Hypertension   . IBS (irritable bowel syndrome)   . Migraine   . Right knee DJD   . Seizures ([place])   . Shortness of breath   . Sleep apnea    no cpap    study 2 yrs ago    Past Surgical History:  Procedure Laterality Date  . CHOLECYSTECTOMY  1980  . LEFT HEART CATHETERIZATION WITH CORONARY ANGIOGRAM N/A 11/18/2013   Procedure: LEFT HEART CATHETERIZATION WITH CORONARY ANGIOGRAM;  Surgeon: Burnell Blanks, MD;  Location: [facility] CATH LAB;  Service: Cardiovascular;  Laterality: N/A;  . OOPHORECTOMY     2012 Dr. Adah Perl in [place]  . TONSILLECTOMY    . TOTAL KNEE ARTHROPLASTY Right 09/02/2012   Procedure: TOTAL KNEE ARTHROPLASTY- right;  Surgeon: Ninetta Lights, MD;  Location: [facility];  Service: Orthopedics;  Laterality: Right;  . TUBAL LIGATION    . TYMPANOMASTOIDECTOMY Left 04/02/2016   Procedure: LEFT CANAL WALL DOWN TYMPANOMASTOIDECTOMY;  Surgeon: Leta Baptist, MD;  Location: [facility];  Service: ENT;  Laterality: Left;  [name] VAGINAL HYSTERECTOMY  1990    Family Psychiatric History:  denies Denies history of dementia of Parkinson's   Family History:  Family History  Problem Relation Age of Onset  . Heart disease Mother   . Diabetes Mother   . Heart disease Brother   . Cancer Sister   . Heart disease Brother   . Heart disease Brother     Social History:  Social History   Social History  . Marital status: Married    Spouse name: N/A  . Number of children: N/A  . Years of education: N/A   Social History Main Topics  . Smoking  status: Never Smoker  . Smokeless tobacco: Never Used  . Alcohol use No  . Drug use: No  . Sexual activity: Not on file   Other Topics Concern  . Not on file   Social History Narrative  . No narrative on file   Grew up in [place], lives in [place] She lives with her husband of 67 years. She has three children She reports her father passed away when she was 45 years old, and has difficult relationship with her mother who was "mean." Work: on disability for four years due to seizure, used to work at [employer] for 17 years Education: 9th grade  Allergies: No Known Allergies  Metabolic Disorder Labs: Lab Results  Component Value Date   HGBA1C 6.2 (H) 09/02/2012   MPG 131 (H) 09/02/2012   No results found for: PROLACTIN Lab Results  Component Value Date   CHOL  05/08/2007    196        ATP III CLASSIFICATION:  <200     mg/dL   Desirable  200-239  mg/dL   Borderline High  >=240    mg/dL   High   TRIG 192 (H) 05/08/2007   HDL 38 (L) 05/08/2007   CHOLHDL 5.2 05/08/2007   VLDL 38 05/08/2007   LDLCALC (H) 05/08/2007    120        Total Cholesterol/HDL:CHD Risk Coronary Heart Disease Risk Table                     Men   Women  1/2 Average Risk   3.4   3.3    Lab Results  Component Value Date   TSH 1.33 05/02/2016   Lab Results  Component Value Date   FOLATE >24.0 05/02/2016   Lab Results  Component Value Date   VITAMINB12 308 05/02/2016    Current Medications: Current Outpatient Prescriptions  Medication Sig Dispense Refill  . acyclovir (ZOVIRAX) 800 MG tablet Take 800 mg by mouth daily.     [name] albuterol (PROVENTIL HFA;VENTOLIN HFA) 108 (90 BASE) MCG/ACT inhaler Inhale 2 puffs into the lungs every 6 (six) hours as needed for wheezing or shortness of breath. 1 Inhaler 3  . amitriptyline (ELAVIL) 50 MG tablet Take 50 mg by mouth at bedtime.    . Black Cohosh 40 MG CAPS Take by mouth daily.    . busPIRone (BUSPAR) 15 MG tablet Take 2 tablets by mouth twice daily     . donepezil (ARICEPT) 5 MG tablet Take 1 tablet (5 mg total) by mouth at bedtime. 90 tablet 0  . Fluticasone Furoate-Vilanterol (BREO ELLIPTA IN) Inhale into the lungs.    . gabapentin (NEURONTIN) 100 MG capsule Take 100 mg by mouth 3 (three) times daily.    [name] lamoTRIgine (LAMICTAL) 200 MG tablet Take 200 mg by mouth 2 (two) times daily.     [name] levocetirizine (XYZAL) 5 MG tablet  Take 5 mg by mouth every evening.    [name] lisinopril-hydrochlorothiazide (PRINZIDE,ZESTORETIC) 20-12.5 MG per tablet Take 1 tablet by mouth daily.     . meloxicam (MOBIC) 7.5 MG tablet Take 7.5 mg by mouth daily.    . metFORMIN (GLUCOPHAGE) 1000 MG tablet Take 1,000 mg by mouth 2 (two) times daily with a meal.    . metoprolol succinate (TOPROL XL) 50 MG 24 hr tablet Take 1 tablet (50 mg total) by mouth daily. 30 tablet 6  . omeprazole (PRILOSEC) 20 MG capsule Take 20 mg by mouth daily.    . simvastatin (ZOCOR) 20 MG tablet Take 20 mg by mouth every evening.    . venlafaxine XR (EFFEXOR-XR) 37.5 MG 24 hr capsule Take total of 187.5 mg daily (150 mg + 37.5 mg) 90 capsule 0  . cholecalciferol (VITAMIN D) 1000 UNITS tablet Take 1,000 Units by mouth daily.    . Insulin Lispro, Human, (HUMALOG KWIKPEN [place]) Inject 2.5 Units into the skin 3 (three) times daily.     [name] venlafaxine XR (EFFEXOR-XR) 150 MG 24 hr capsule Take total of 187.5 (150+37.5) mg daily 90 capsule 0   No current facility-administered medications for this visit.     Neurologic: Headache: No Seizure: No Paresthesias: No  Musculoskeletal: Strength & Muscle Tone: within normal limits Gait & Station: normal Patient leans: N/A  Psychiatric Specialty Exam: Review of Systems  Neurological: Positive for headaches. Negative for tremors.  Psychiatric/Behavioral: Positive for depression and memory loss. Negative for hallucinations, substance abuse and suicidal ideas. The patient is nervous/anxious. The patient does not have insomnia.   All other systems reviewed  and are negative.   Blood pressure 110/60, pulse 88, height 5' 4"  (1.626 m), weight 180 lb (81.6 kg), SpO2 97 %.Body mass index is 30.9 kg/m.  General Appearance: Fairly Groomed  Eye Contact:  Good  Speech:  Clear and Coherent  Volume:  Normal  Mood:  "better"  Affect:  Appropriate, Congruent and slightly rescricted  Thought Process:  Coherent, derailment at times  Orientation:  Full (Time, Place, and Person)   Thought Content: no paranoia  Perceptions: denies AH/VH  Suicidal Thoughts:  No  Homicidal Thoughts:  No  Memory:  Immediate;   Good Recent;   Fair Remote;   Poor  Judgement:  Fair  Insight:  Shallow  Psychomotor Activity:  Normal  Concentration:  Concentration: Good and Attention Span: Good  Recall:  [place] of Knowledge: Good  Language: Good  Akathisia:  No  Handed:  Right  AIMS (if indicated):  N/A + postural tremors, no resting tremors or rigidity  Assets:  Communication Skills Desire for Improvement  ADL's:  Intact  Cognition: WNL  Sleep:  good   Clock drawing 05/02/2016: 1/3 (wrote down number 1-7 and 12 only, unable to place hands in the clock. She placed her both hands on the paper, although she was instructed many times to draw clock hands) Delayed recall 2/3  [facility] 05/31/2016 8/30 (+1 for clock. Unable to write down clock hands and placed numbers in wrong position, +1 for naming, +1 for attention, +5 for orientation)  Head CT 04/2013 IMPRESSION: 1. No acute intracranial process identified. 2. Mild chronic microvascular ischemic disease, stable relative to prior study.  Assessment Carol Richmond is a 66 year old female with a history of depression, diabetes, hyperlipidemia, headaches, non-epileptic episodes of decreased responsiveness followed by neurologist, recurrent left cholesteatoma, who presents here for follow up appointment for anxiety and cognitive impairment.    #  Unspecified anxiety disorder # r/o GAD There has been overall  improvement in her anxiety since uptitration of Effexor. Will continue the current dose to target her anxiety. Will consider tapering down buspirone/gabapentin in the future to avoid polypharmacy. Although lamotrigine had been prescribed by other provider as mood stabilizer (not for seizure), this medication to be continued at this time given the patient and her daughter is concerned about its effect on seizure.   # Mild neurocognitive disorder Patient continues to demonstrate derailment in her thought process. MOCA score was low as well, although collaterals from both her husband and daughter with no significant impairment in her ADL/IADL (except they would not let her drive). No known treatable cause of dementia on labs (except anemia), CT/MRI a few years ago (microvascular ischemic change.) Will continue Aricept at this time for neurological deficit.  Discussed risk of nausea or vomiting. Although amitriptyline can cause cognitive deficits, she is advised to continue the current dose given it is effective for her headache.  Plan 1. Continue Aricept 5 mg daily 2. Continue Effexor 187.5 mg daily (37.5 mg +150 mg ) 3. Return to clinic in three months for 30 mins 4. Patient is advised to contact her primary care for anemia (Patient is on buspirone 15 mg BID, gabapentin 100 mg TID, lamotrigine 200 mg BID prescribed by her other provider) (Patient will ask her PCP to be prescribed on amitriptyline for headache)  The patient demonstrates the following risk factors for suicide: Chronic risk factors for suicide include: psychiatric disorder of depression. Acute risk factors for suicide include: unemployment. Protective factors for this patient include: positive social support and hope for the future. Considering these factors, the overall suicide risk at this point appears to be low. Patient is appropriate for outpatient follow up.   Treatment Plan Summary: Plan as above  The duration of this  appointment visit was 30 minutes of face-to-face time with the patient.  Greater than 50% of this time was spent in counseling, explanation of  diagnosis, planning of further management, and coordination of care.  Norman Clay, MD 07/30/2016, 12:10 PM

## 2016-07-29 ENCOUNTER — Telehealth (HOSPITAL_COMMUNITY): Payer: Self-pay | Admitting: *Deleted

## 2016-07-29 NOTE — Telephone Encounter (Signed)
returned phone call to patient, she wanted to confirm her next appointment.

## 2016-07-30 ENCOUNTER — Ambulatory Visit (INDEPENDENT_AMBULATORY_CARE_PROVIDER_SITE_OTHER): Payer: PPO | Admitting: Psychiatry

## 2016-07-30 VITALS — BP 110/60 | HR 88 | Ht 64.0 in | Wt 180.0 lb

## 2016-07-30 DIAGNOSIS — Z794 Long term (current) use of insulin: Secondary | ICD-10-CM

## 2016-07-30 DIAGNOSIS — F329 Major depressive disorder, single episode, unspecified: Secondary | ICD-10-CM | POA: Diagnosis not present

## 2016-07-30 DIAGNOSIS — F419 Anxiety disorder, unspecified: Secondary | ICD-10-CM

## 2016-07-30 DIAGNOSIS — Z7951 Long term (current) use of inhaled steroids: Secondary | ICD-10-CM

## 2016-07-30 DIAGNOSIS — H9502 Recurrent cholesteatoma of postmastoidectomy cavity, left ear: Secondary | ICD-10-CM

## 2016-07-30 DIAGNOSIS — Z79899 Other long term (current) drug therapy: Secondary | ICD-10-CM | POA: Diagnosis not present

## 2016-07-30 DIAGNOSIS — R51 Headache: Secondary | ICD-10-CM

## 2016-07-30 DIAGNOSIS — F411 Generalized anxiety disorder: Secondary | ICD-10-CM

## 2016-07-30 DIAGNOSIS — R569 Unspecified convulsions: Secondary | ICD-10-CM

## 2016-07-30 DIAGNOSIS — E785 Hyperlipidemia, unspecified: Secondary | ICD-10-CM

## 2016-07-30 DIAGNOSIS — E119 Type 2 diabetes mellitus without complications: Secondary | ICD-10-CM | POA: Diagnosis not present

## 2016-07-30 DIAGNOSIS — Z7984 Long term (current) use of oral hypoglycemic drugs: Secondary | ICD-10-CM

## 2016-07-30 DIAGNOSIS — Z791 Long term (current) use of non-steroidal anti-inflammatories (NSAID): Secondary | ICD-10-CM | POA: Diagnosis not present

## 2016-07-30 DIAGNOSIS — G3184 Mild cognitive impairment, so stated: Secondary | ICD-10-CM

## 2016-07-30 MED ORDER — VENLAFAXINE HCL ER 37.5 MG PO CP24: ORAL_CAPSULE | ORAL | 0 refills | Status: DC

## 2016-07-30 MED ORDER — DONEPEZIL HCL 5 MG PO TABS: 5.0000 mg | ORAL_TABLET | Freq: Every day | ORAL | 0 refills | Status: DC

## 2016-07-30 MED ORDER — VENLAFAXINE HCL ER 150 MG PO CP24: ORAL_CAPSULE | ORAL | 0 refills | Status: DC

## 2016-07-30 NOTE — Patient Instructions (Signed)
1. Continue Aricept 5 mg daily 2. Continue Effexor 187.5 mg daily (37.5 mg +150 mg ) 3. Return to clinic in three months for 30 mins 4. Please discuss about your anemia with your primary care

## 2016-08-16 ENCOUNTER — Ambulatory Visit (INDEPENDENT_AMBULATORY_CARE_PROVIDER_SITE_OTHER): Payer: PPO | Admitting: Neurology

## 2016-08-16 ENCOUNTER — Encounter: Payer: Self-pay | Admitting: Neurology

## 2016-08-16 VITALS — BP 122/64 | HR 96 | Ht 64.0 in | Wt 180.0 lb

## 2016-08-16 DIAGNOSIS — R29898 Other symptoms and signs involving the musculoskeletal system: Secondary | ICD-10-CM | POA: Insufficient documentation

## 2016-08-16 DIAGNOSIS — G43009 Migraine without aura, not intractable, without status migrainosus: Secondary | ICD-10-CM | POA: Diagnosis not present

## 2016-08-16 NOTE — Progress Notes (Signed)
NEUROLOGY FOLLOW UP OFFICE NOTE  Carol Richmond [id number]  HISTORY OF PRESENT ILLNESS: I had the pleasure of seeing Carol Richmond in follow-up in the neurology clinic on 08/16/2016.  The patient was last seen 8 months ago for chronic daily headaches. She is again accompanied by her husband who helps supplement the history today. She reports the headaches came back when amitriptyline dose was decreased, she is now back to 19m daily with better headache control. She present for an earlier visit for intermittent left leg weakness. She reports her left knee "just gives way." There is no back or knee pain, no clear jerking, she would be walking then feels shaky all over, then her left leg buckles. She had a bad one a month ago as she got out of the car, friends had to help her. They reported she looked pale. They only last a few seconds, she sits and feels better. She had one at her doctor's office. She denies any shooting pain, no numbness/tingling. She does not have the shaking when sitting or supine. Arms are not involved. She was initially seen for episodes of loss of consciousness that were non-epileptic, these are not occurring anymore, husband denies any unresponsiveness when she has the leg symptoms. She has been seeing psychiatry and reports mood is improved with medication changes.   HPI: This is a 66yo RH woman with a history of diabetes, hyperlipidemia, headaches, and non-epileptic seizures where she would suddenly fall asleep. Records from her neurologist from 2009 to 2013 were reviewed. She was having episodes of abnormal smell like inhaling anesthesia, followed by breathing problems, numbness, disorientation, memory loss, difficulty finding words, and mild staggering gait. She was started on Keppra, then switched to Topamax. A 48-hour EEG in 2010 captured 2 typical spells with no associated EEG changes. Baseline EEG was normal. She started seeing a psychologist and psychiatrist,  Topamax was tapered and she was started on Lamictal for mood stabilization. She was told by the psychologist that she has "absent seizures" and that "the left side of her brain is getting worse, and this could be affected by her sleep apnea." [facility] of unresponsiveness could be due to sleep attacks. She has a diagnosis of obstructive sleep apnea. It appears the spells resolved in 2011.   Since November 2014 or so, she has had episodes of feeling off balance with speech difficulties where speech is slurred and she is unable to find appropriate words. [facility], could not keep her balance, then felt her speech was going. They were at a birthday party on 05/09/13 when it looked like she was waking up, then started "acting like a drunk person." She has been having episodes of blanking out "like she is out in space," occurring 2-3 times a week. She states these are different from the spells she had in 2009. Her husband reports that each time is worse than the other. She has been having headaches on a near daily basis. Headaches are over the vertex or bilateral temporal regions. She hears a "boom" inside her head and wants to pull her hair out. There is no associated nausea/vomiting/photo or phonophobia. She asked if these episodes can be caused by stress, reporting that she has been having more episodes since she has been taking care of her sister the past 3 months. She would "cry over nothing." She has been taking Lexapro  for many years and feels that "it does not seem to be working." She takes Ativan 79m twice a day for anxiety.   Diagnostic Data: I personally reviewed MRI brain without contrast which did not show any acute changes, there was moderate chronic white matter microvascular disease, hippocampi symmetric with no abnormal signal seen. Her 24-hour EEG was mildly abnormal due to mild diffuse  slowing of the background, no epileptiform discharges or electrographic seizures seen. She did not complete diary, but tells me today that she did have an episode where she felt she had a seizure and looked like she fell asleep.   PAST MEDICAL HISTORY: Past Medical History:  Diagnosis Date  . Asthma   . Diabetes mellitus without complication ([place]   . Diarrhea   . Diverticulosis of colon (without mention of hemorrhage)   . Functional ovarian cysts   . GERD (gastroesophageal reflux disease)   . Hemorrhoids, external, without mention of complication   . Hormone replacement therapy (postmenopausal)   . Hypertension   . IBS (irritable bowel syndrome)   . Migraine   . Right knee DJD   . Seizures ([place]   . Shortness of breath   . Sleep apnea    no cpap    study 2 yrs ago    MEDICATIONS:  Outpatient Encounter Prescriptions as of 08/16/2016  Medication Sig  . acyclovir (ZOVIRAX) 800 MG tablet Take 800 mg by mouth daily.   .Marland Kitchenalbuterol (PROVENTIL HFA;VENTOLIN HFA) 108 (90 BASE) MCG/ACT inhaler Inhale 2 puffs into the lungs every 6 (six) hours as needed for wheezing or shortness of breath.  .Marland Kitchenamitriptyline (ELAVIL) 50 MG tablet Take 50 mg by mouth at bedtime.  . Black Cohosh 40 MG CAPS Take by mouth daily.  . busPIRone (BUSPAR) 15 MG tablet Take 2 tablets by mouth twice daily  . cholecalciferol (VITAMIN D) 1000 UNITS tablet Take 1,000 Units by mouth daily.  .Marland Kitchendonepezil (ARICEPT) 5 MG tablet Take 1 tablet (5 mg total) by mouth at bedtime.  . Fluticasone Furoate-Vilanterol (BREO ELLIPTA IN) Inhale into the lungs.  . gabapentin (NEURONTIN) 100 MG capsule Take 100 mg by mouth 3 (three) times daily.  . Insulin Lispro, Human, (HUMALOG KWIKPEN [place]) Inject 2.5 Units into the skin 3 (three) times daily.   .Marland KitchenlamoTRIgine (LAMICTAL) 200 MG tablet Take 200 mg by mouth 2 (two) times daily.   .Marland Kitchenlevocetirizine (XYZAL) 5 MG tablet Take 5 mg by mouth every evening.  .Marland Kitchenlisinopril-hydrochlorothiazide  (PRINZIDE,ZESTORETIC) 20-12.5 MG per tablet Take 1 tablet by mouth daily.   . meloxicam (MOBIC) 7.5 MG tablet Take 7.5 mg by mouth daily.  . metFORMIN (GLUCOPHAGE) 1000 MG tablet Take 1,000 mg by mouth 2 (two) times daily with a meal.  . metoprolol succinate (TOPROL XL) 50 MG 24 hr tablet Take 1 tablet (50 mg total) by mouth daily.  .Marland Kitchenomeprazole (PRILOSEC) 20 MG capsule Take 20 mg by mouth daily.  . simvastatin (ZOCOR) 20 MG tablet Take 20 mg by mouth every evening.  . venlafaxine XR (EFFEXOR-XR) 150 MG 24 hr capsule Take total of 187.5 (150+37.5) mg daily  . venlafaxine XR (EFFEXOR-XR) 37.5 MG 24 hr capsule Take total of 187.5 mg daily (150 mg + 37.5 mg)   No facility-administered encounter medications on file as of 08/16/2016.      ALLERGIES: No Known Allergies  FAMILY HISTORY: Family History  Problem Relation Age of Onset  . Heart disease Mother   . Diabetes Mother   .  Heart disease Brother   . Cancer Sister   . Heart disease Brother   . Heart disease Brother     SOCIAL HISTORY: Social History   Social History  . Marital status: Married    Spouse name: N/A  . Number of children: N/A  . Years of education: N/A   Occupational History  . Not on file.   Social History Main Topics  . Smoking status: Never Smoker  . Smokeless tobacco: Never Used  . Alcohol use No  . Drug use: No  . Sexual activity: Not on file   Other Topics Concern  . Not on file   Social History Narrative  . No narrative on file    REVIEW OF SYSTEMS: Constitutional: No fevers, chills, or sweats, no generalized fatigue, change in appetite Eyes: No visual changes, double vision, eye pain Ear, nose and throat: No hearing loss, ear pain, nasal congestion, sore throat Cardiovascular: No chest pain, palpitations Respiratory:  No shortness of breath at rest or with exertion, wheezes GastrointestinaI: No nausea, vomiting, diarrhea, abdominal pain, fecal incontinence Genitourinary:  No dysuria,  urinary retention or frequency Musculoskeletal:  No neck pain, back pain Integumentary: No rash, pruritus, skin lesions Neurological: as above Psychiatric: + depression, no insomnia, + anxiety Endocrine: No palpitations, fatigue, diaphoresis, mood swings, change in appetite, change in weight, increased thirst Hematologic/Lymphatic:  No anemia, purpura, petechiae. Allergic/Immunologic: no itchy/runny eyes, nasal congestion, recent allergic reactions, rashes  PHYSICAL EXAM: Vitals:   08/16/16 0835  BP: 122/64  Pulse: 96   Orthostatic VS for the past 24 hrs:  BP- Lying Pulse- Lying BP- Sitting Pulse- Sitting BP- Standing at 0 minutes Pulse- Standing at 0 minutes  08/16/16 0912 128/62 90 128/76 95 108/68 96   General: No acute distress Head:  Normocephalic/atraumatic Neck: supple, no paraspinal tenderness, full range of motion Heart:  Regular rate and rhythm Lungs:  Clear to auscultation bilaterally Back: No paraspinal tenderness Skin/Extremities: No rash, no edema Neurological Exam: alert and oriented to person, place, and time. No aphasia or dysarthria. Fund of knowledge is appropriate.  Recent and remote memory are intact. 3/3 delayed recall.  Attention and concentration are normal.    Able to name objects and repeat phrases. Cranial nerves: Pupils equal, round, reactive to light.  Extraocular movements intact with no nystagmus. Visual fields full. Facial sensation intact. No facial asymmetry. Tongue, uvula, palate midline.  Motor: Bulk and tone normal, muscle strength 5/5 throughout with no pronator drift.  Sensation to light touch, pin, vibration intact.  No extinction to double simultaneous stimulation.  Deep tendon reflexes asymmetry on UE, +2 on right UE, unable to elicit right patella reflex, +2 on left patella, both ankles, toes downgoing.  Finger to nose testing intact.  Gait narrow-based, appears to be favoring right knee but denies any pain, able to tandem walk slowly. Romberg  negative.  IMPRESSION: This is a 66 yo RH woman with a history of diabetes, hyperlipidemia, headaches, and non-epileptic episodes of decreased responsiveness, who initially presented with episodes of feeling off balance with slurred speech. MRI brain and 24-hour EEG unremarkable. No further episodes of decreased responsiveness. Headaches are fairly controlled on amitriptyline 95m qhs, continue current dose. She knows to minimize Tylenol intake to 2-3 a week to avoid rebound headaches. She has been having more episodes of her left leg giving way, preceded by feeling shaky. The etiology of her symptoms is unclear. Her neurological exam shows some asymmetry of reflexes, MRI lumbar spine without contrast will  be ordered to assess for any structural abnormality. It may be a primary knee issue as well, xray of left knee will be ordered. Orthostatic tremor is also considered as she reports feeling shaky prior to leg symptoms, she does have a 20-point drop in BP from sitting to standing but was asymptomatic today, this continues to be in the differentials. Continue using walker/cane. If MRI and xray are unremarkable, we will schedule an EMG/NCV of the left leg. She will follow-up after the tests and knows to call for any changes.   Thank you for allowing me to participate in her care.  Please do not hesitate to call for any questions or concerns.  The duration of this appointment visit was 25 minutes of face-to-face time with the patient.  Greater than 50% of this time was spent in counseling, explanation of diagnosis, planning of further management, and coordination of care.   Ellouise Newer, M.D.   CC: Dr. Sherrie Sport

## 2016-08-16 NOTE — Patient Instructions (Addendum)
1. Schedule MRI lumbar spine without contrast 2. Schedule left knee xray  We have sent a referral to [facility] for your MRI and XRAY.  They will call you directly to schedule your appt. They are located at [place]. If you need to contact them directly please call [phone].   3. Our office will call you with results, if tests are not showing any changes, we will schedule the nerve test 4. Follow-up after tests

## 2016-08-22 DIAGNOSIS — H348111 Central retinal vein occlusion, right eye, with retinal neovascularization: Secondary | ICD-10-CM | POA: Diagnosis not present

## 2016-08-22 DIAGNOSIS — H35041 Retinal micro-aneurysms, unspecified, right eye: Secondary | ICD-10-CM | POA: Diagnosis not present

## 2016-08-22 DIAGNOSIS — H3582 Retinal ischemia: Secondary | ICD-10-CM | POA: Diagnosis not present

## 2016-08-22 DIAGNOSIS — E119 Type 2 diabetes mellitus without complications: Secondary | ICD-10-CM | POA: Diagnosis not present

## 2016-09-05 ENCOUNTER — Ambulatory Visit
Admission: RE | Admit: 2016-09-05 | Discharge: 2016-09-05 | Disposition: A | Discharge status: Home / Self Care | Payer: PPO | Source: Ambulatory Visit | Attending: Neurology | Admitting: Neurology

## 2016-09-05 DIAGNOSIS — M47816 Spondylosis without myelopathy or radiculopathy, lumbar region: Secondary | ICD-10-CM | POA: Diagnosis not present

## 2016-09-05 DIAGNOSIS — R29898 Other symptoms and signs involving the musculoskeletal system: Secondary | ICD-10-CM

## 2016-09-09 ENCOUNTER — Ambulatory Visit (INDEPENDENT_AMBULATORY_CARE_PROVIDER_SITE_OTHER): Payer: PPO | Admitting: Otolaryngology

## 2016-09-09 ENCOUNTER — Telehealth: Payer: Self-pay | Admitting: *Deleted

## 2016-09-09 DIAGNOSIS — H95122 Granulation of postmastoidectomy cavity, left ear: Secondary | ICD-10-CM | POA: Diagnosis not present

## 2016-09-09 NOTE — Telephone Encounter (Signed)
-----   Message from Cameron Sprang, MD sent at 09/09/2016  3:16 PM EDT ----- Pls let her know MRI lumbar spine showed mild arthritis changes, no nerve or spinal cord impingement. Would do the nerve test for the left leg as discussed, pls schedule her for EMG with Dr. Posey Pronto for left LE. Thanks!

## 2016-09-09 NOTE — Telephone Encounter (Signed)
Left message for patient to call me back. 

## 2016-09-10 ENCOUNTER — Telehealth: Payer: Self-pay | Admitting: *Deleted

## 2016-09-10 ENCOUNTER — Other Ambulatory Visit: Payer: Self-pay

## 2016-09-10 ENCOUNTER — Telehealth: Payer: Self-pay

## 2016-09-10 DIAGNOSIS — R29898 Other symptoms and signs involving the musculoskeletal system: Secondary | ICD-10-CM

## 2016-09-10 NOTE — Telephone Encounter (Signed)
Left message for patient to call me back. 

## 2016-09-10 NOTE — Telephone Encounter (Signed)
Spoke with pt, relaying message below.

## 2016-09-10 NOTE — Telephone Encounter (Signed)
-----   Message from Cameron Sprang, MD sent at 09/09/2016  3:16 PM EDT ----- Pls let her know MRI lumbar spine showed mild arthritis changes, no nerve or spinal cord impingement. Would do the nerve test for the left leg as discussed, pls schedule her for EMG with Dr. Posey Pronto for left LE. Thanks!

## 2016-09-10 NOTE — Telephone Encounter (Signed)
ERROR

## 2016-09-12 ENCOUNTER — Other Ambulatory Visit: Payer: Self-pay | Admitting: *Deleted

## 2016-09-12 ENCOUNTER — Encounter: Payer: Self-pay | Admitting: Neurology

## 2016-09-12 DIAGNOSIS — R29898 Other symptoms and signs involving the musculoskeletal system: Secondary | ICD-10-CM

## 2016-09-16 DIAGNOSIS — K219 Gastro-esophageal reflux disease without esophagitis: Secondary | ICD-10-CM | POA: Diagnosis not present

## 2016-09-16 DIAGNOSIS — F3289 Other specified depressive episodes: Secondary | ICD-10-CM | POA: Diagnosis not present

## 2016-09-16 DIAGNOSIS — E119 Type 2 diabetes mellitus without complications: Secondary | ICD-10-CM | POA: Diagnosis not present

## 2016-09-16 DIAGNOSIS — I10 Essential (primary) hypertension: Secondary | ICD-10-CM | POA: Diagnosis not present

## 2016-09-16 DIAGNOSIS — Z6831 Body mass index (BMI) 31.0-31.9, adult: Secondary | ICD-10-CM | POA: Diagnosis not present

## 2016-09-19 ENCOUNTER — Ambulatory Visit (INDEPENDENT_AMBULATORY_CARE_PROVIDER_SITE_OTHER): Payer: PPO | Admitting: Neurology

## 2016-09-19 ENCOUNTER — Telehealth: Payer: Self-pay

## 2016-09-19 DIAGNOSIS — R29898 Other symptoms and signs involving the musculoskeletal system: Secondary | ICD-10-CM | POA: Diagnosis not present

## 2016-09-19 NOTE — Telephone Encounter (Signed)
-----   Message from Cameron Sprang, MD sent at 09/19/2016 11:56 AM EDT ----- Pls let her know the nerve test is normal, the left leg problems do not appear to be related to her nerves, it may be due to her knee. Follow-up with PCP. Thanks

## 2016-09-19 NOTE — Telephone Encounter (Signed)
LMOM relaying message below.  

## 2016-09-19 NOTE — Procedures (Signed)
[facility] Neu[facility]  [place], [address]  [place], [place] [address] Tel: [phone] Fax:  [phone] Test Date:  09/19/2016  Patient: Carol Richmond DOB: Jul 11, 1950 Physician: Narda Amber, DO  Sex: Female Height: 5' 4"  Ref Phys: Ellouise Newer, M.D.  ID#: [id number] Temp: 33.4C Technician:    Patient Complaints: This is a 66 year-old female referred for evaluation of transient left leg weakness.  NCV & EMG Findings: Extensive electrodiagnostic testing of the left lower extremity shows:  1. Left sural and superficial peroneal sensory responses are within normal limits. 2. Left peroneal and tibial motor responses are within normal limits. 3. Left tibial H reflex study is within normal limits. 4. There is no evidence of active or chronic motor axon loss changes affecting any of the tested muscles. Motor unit configuration and recruitment pattern is within normal limits.  Impression: This is a normal study of the left lower extremity. In particular, there is no evidence of a generalized sensorimotor polyneuropathy or lumbosacral radiculopathy.   ___________________________ Narda Amber, DO    Nerve Conduction Studies Anti Sensory Summary Table   Stim Site NR Peak (ms) Norm Peak (ms) P-T Amp (V) Norm P-T Amp  Left Sup Peroneal Anti Sensory (Ant Lat Mall)  12 cm    2.7 <4.6 7.2 >3  Left Sural Anti Sensory (Lat Mall)  Calf    4.5 <4.6 4.2 >3   Motor Summary Table   Stim Site NR Onset (ms) Norm Onset (ms) O-P Amp (mV) Norm O-P Amp Site1 Site2 Delta-0 (ms) Dist (cm) Vel (m/s) Norm Vel (m/s)  Left Peroneal Motor (Ext Dig Brev)  Ankle    4.4 <6.0 4.0 >2.5 B Fib Ankle 7.6 36.0 47 >40  B Fib    12.0  3.6  Poplt B Fib 1.1 7.0 64 >40  Poplt    13.1  3.5         Left Tibial Motor (Abd Hall Brev)  Ankle    3.8 <6.0 12.5 >4 Knee Ankle 9.6 39.0 41 >40  Knee    13.4  8.8          H Reflex Studies   NR H-Lat (ms) Lat Norm (ms) L-R H-Lat (ms)  Left Tibial (Gastroc)   35.10 <35    EMG   Side Muscle Ins Act Fibs Psw Fasc Number Recrt Dur Dur. Amp Amp. Poly Poly. Comment  Left AntTibialis Nml Nml Nml Nml Nml Nml Nml Nml Nml Nml Nml Nml N/A  Left Gastroc Nml Nml Nml Nml Nml Nml Nml Nml Nml Nml Nml Nml N/A  Left Flex Dig Long Nml Nml Nml Nml Nml Nml Nml Nml Nml Nml Nml Nml N/A  Left RectFemoris Nml Nml Nml Nml Nml Nml Nml Nml Nml Nml Nml Nml N/A  Left GluteusMed Nml Nml Nml Nml Nml Nml Nml Nml Nml Nml Nml Nml N/A  Left BicepsFemS Nml Nml Nml Nml Nml Nml Nml Nml Nml Nml Nml Nml N/A      Waveforms:

## 2016-10-22 DIAGNOSIS — R0602 Shortness of breath: Secondary | ICD-10-CM | POA: Diagnosis not present

## 2016-10-22 DIAGNOSIS — E871 Hypo-osmolality and hyponatremia: Secondary | ICD-10-CM | POA: Diagnosis not present

## 2016-10-22 DIAGNOSIS — M79642 Pain in left hand: Secondary | ICD-10-CM | POA: Diagnosis not present

## 2016-10-22 DIAGNOSIS — R531 Weakness: Secondary | ICD-10-CM | POA: Diagnosis not present

## 2016-10-22 DIAGNOSIS — W19XXXA Unspecified fall, initial encounter: Secondary | ICD-10-CM | POA: Diagnosis not present

## 2016-10-22 DIAGNOSIS — R0682 Tachypnea, not elsewhere classified: Secondary | ICD-10-CM | POA: Diagnosis not present

## 2016-10-23 DIAGNOSIS — Z8249 Family history of ischemic heart disease and other diseases of the circulatory system: Secondary | ICD-10-CM | POA: Diagnosis not present

## 2016-10-23 DIAGNOSIS — Z7984 Long term (current) use of oral hypoglycemic drugs: Secondary | ICD-10-CM | POA: Diagnosis not present

## 2016-10-23 DIAGNOSIS — S50312A Abrasion of left elbow, initial encounter: Secondary | ICD-10-CM | POA: Diagnosis not present

## 2016-10-23 DIAGNOSIS — F319 Bipolar disorder, unspecified: Secondary | ICD-10-CM | POA: Diagnosis not present

## 2016-10-23 DIAGNOSIS — E871 Hypo-osmolality and hyponatremia: Secondary | ICD-10-CM | POA: Diagnosis not present

## 2016-10-23 DIAGNOSIS — F0390 Unspecified dementia without behavioral disturbance: Secondary | ICD-10-CM | POA: Diagnosis not present

## 2016-10-23 DIAGNOSIS — D649 Anemia, unspecified: Secondary | ICD-10-CM | POA: Diagnosis not present

## 2016-10-23 DIAGNOSIS — N390 Urinary tract infection, site not specified: Secondary | ICD-10-CM | POA: Diagnosis not present

## 2016-10-23 DIAGNOSIS — Z79899 Other long term (current) drug therapy: Secondary | ICD-10-CM | POA: Diagnosis not present

## 2016-10-23 DIAGNOSIS — I5031 Acute diastolic (congestive) heart failure: Secondary | ICD-10-CM | POA: Diagnosis not present

## 2016-10-23 DIAGNOSIS — R569 Unspecified convulsions: Secondary | ICD-10-CM | POA: Diagnosis not present

## 2016-10-23 DIAGNOSIS — R531 Weakness: Secondary | ICD-10-CM | POA: Diagnosis not present

## 2016-10-23 DIAGNOSIS — J310 Chronic rhinitis: Secondary | ICD-10-CM | POA: Diagnosis not present

## 2016-10-23 DIAGNOSIS — J441 Chronic obstructive pulmonary disease with (acute) exacerbation: Secondary | ICD-10-CM | POA: Diagnosis not present

## 2016-10-23 DIAGNOSIS — E119 Type 2 diabetes mellitus without complications: Secondary | ICD-10-CM | POA: Diagnosis not present

## 2016-10-23 DIAGNOSIS — E222 Syndrome of inappropriate secretion of antidiuretic hormone: Secondary | ICD-10-CM | POA: Diagnosis not present

## 2016-10-23 DIAGNOSIS — Z833 Family history of diabetes mellitus: Secondary | ICD-10-CM | POA: Diagnosis not present

## 2016-10-23 DIAGNOSIS — I110 Hypertensive heart disease with heart failure: Secondary | ICD-10-CM | POA: Diagnosis not present

## 2016-10-23 DIAGNOSIS — E785 Hyperlipidemia, unspecified: Secondary | ICD-10-CM | POA: Diagnosis not present

## 2016-10-23 DIAGNOSIS — W19XXXA Unspecified fall, initial encounter: Secondary | ICD-10-CM | POA: Diagnosis not present

## 2016-10-23 DIAGNOSIS — K219 Gastro-esophageal reflux disease without esophagitis: Secondary | ICD-10-CM | POA: Diagnosis not present

## 2016-10-23 DIAGNOSIS — R0602 Shortness of breath: Secondary | ICD-10-CM | POA: Diagnosis not present

## 2016-10-28 NOTE — Progress Notes (Deleted)
[facility] MD/PA/NP OP Progress Note  10/28/2016 8:20 AM Carol Richmond  MRN:  [id number]  Chief Complaint:  HPI:  - She was seen by neurologist for knee shaking  Visit Diagnosis: No diagnosis found.  Past Psychiatric History:  I have reviewed the patient's psychiatry history in detail and updated the patient record.  Past Medical History:  Past Medical History:  Diagnosis Date  . Asthma   . Diabetes mellitus without complication ([place])   . Diarrhea   . Diverticulosis of colon (without mention of hemorrhage)   . Functional ovarian cysts   . GERD (gastroesophageal reflux disease)   . Hemorrhoids, external, without mention of complication   . Hormone replacement therapy (postmenopausal)   . Hypertension   . IBS (irritable bowel syndrome)   . Migraine   . Right knee DJD   . Seizures ([place])   . Shortness of breath   . Sleep apnea    no cpap    study 2 yrs ago    Past Surgical History:  Procedure Laterality Date  . CHOLECYSTECTOMY  1980  . LEFT HEART CATHETERIZATION WITH CORONARY ANGIOGRAM N/A 11/18/2013   Procedure: LEFT HEART CATHETERIZATION WITH CORONARY ANGIOGRAM;  Surgeon: Burnell Blanks, MD;  Location: [facility] CATH LAB;  Service: Cardiovascular;  Laterality: N/A;  . OOPHORECTOMY     2012 Dr. Adah Perl in [place]  . TONSILLECTOMY    . TOTAL KNEE ARTHROPLASTY Right 09/02/2012   Procedure: TOTAL KNEE ARTHROPLASTY- right;  Surgeon: Ninetta Lights, MD;  Location: [place] [facility];  Service: Orthopedics;  Laterality: Right;  . TUBAL LIGATION    . TYMPANOMASTOIDECTOMY Left 04/02/2016   Procedure: LEFT CANAL WALL DOWN TYMPANOMASTOIDECTOMY;  Surgeon: Leta Baptist, MD;  Location: [place];  Service: ENT;  Laterality: Left;  [name] VAGINAL HYSTERECTOMY  1990    Family Psychiatric History:  I have reviewed the patient's family history in detail and updated the patient record.  Family History:  Family History  Problem Relation Age of Onset  . Heart disease Mother   . Diabetes Mother    . Heart disease Brother   . Cancer Sister   . Heart disease Brother   . Heart disease Brother     Social History:  Social History   Social History  . Marital status: Married    Spouse name: N/A  . Number of children: N/A  . Years of education: N/A   Social History Main Topics  . Smoking status: Never Smoker  . Smokeless tobacco: Never Used  . Alcohol use No  . Drug use: No  . Sexual activity: Not on file   Other Topics Concern  . Not on file   Social History Narrative  . No narrative on file    Allergies: No Known Allergies  Metabolic Disorder Labs: Lab Results  Component Value Date   HGBA1C 6.2 (H) 09/02/2012   MPG 131 (H) 09/02/2012   No results found for: PROLACTIN Lab Results  Component Value Date   CHOL  05/08/2007    196        ATP III CLASSIFICATION:  <200     mg/dL   Desirable  200-239  mg/dL   Borderline High  >=240    mg/dL   High   TRIG 192 (H) 05/08/2007   HDL 38 (L) 05/08/2007   CHOLHDL 5.2 05/08/2007   VLDL 38 05/08/2007   LDLCALC (H) 05/08/2007    120        Total Cholesterol/HDL:CHD Risk Coronary Heart  Disease Risk Table                     Men   Women  1/2 Average Risk   3.4   3.3   Lab Results  Component Value Date   TSH 1.33 05/02/2016   TSH 1.118 ***Test methodology is 3rd generation TSH*** 05/08/2007    Therapeutic Level Labs: No results found for: LITHIUM No results found for: VALPROATE No components found for:  CBMZ  Current Medications: Current Outpatient Prescriptions  Medication Sig Dispense Refill  . acyclovir (ZOVIRAX) 800 MG tablet Take 800 mg by mouth daily.     [name] albuterol (PROVENTIL HFA;VENTOLIN HFA) 108 (90 BASE) MCG/ACT inhaler Inhale 2 puffs into the lungs every 6 (six) hours as needed for wheezing or shortness of breath. 1 Inhaler 3  . amitriptyline (ELAVIL) 50 MG tablet Take 50 mg by mouth at bedtime.    . Black Cohosh 40 MG CAPS Take by mouth daily.    . busPIRone (BUSPAR) 15 MG tablet Take 2 tablets by  mouth twice daily    . cholecalciferol (VITAMIN D) 1000 UNITS tablet Take 1,000 Units by mouth daily.    [name] donepezil (ARICEPT) 5 MG tablet Take 1 tablet (5 mg total) by mouth at bedtime. 90 tablet 0  . Fluticasone Furoate-Vilanterol (BREO ELLIPTA IN) Inhale into the lungs.    . gabapentin (NEURONTIN) 100 MG capsule Take 100 mg by mouth 3 (three) times daily.    . Insulin Lispro, Human, (HUMALOG KWIKPEN ) Inject 2.5 Units into the skin 3 (three) times daily.     [name] lamoTRIgine (LAMICTAL) 200 MG tablet Take 200 mg by mouth 2 (two) times daily.     [name] levocetirizine (XYZAL) 5 MG tablet Take 5 mg by mouth every evening.    [name] lisinopril-hydrochlorothiazide (PRINZIDE,ZESTORETIC) 20-12.5 MG per tablet Take 1 tablet by mouth daily.     . meloxicam (MOBIC) 7.5 MG tablet Take 7.5 mg by mouth daily.    . metFORMIN (GLUCOPHAGE) 1000 MG tablet Take 1,000 mg by mouth 2 (two) times daily with a meal.    . metoprolol succinate (TOPROL XL) 50 MG 24 hr tablet Take 1 tablet (50 mg total) by mouth daily. 30 tablet 6  . omeprazole (PRILOSEC) 20 MG capsule Take 20 mg by mouth daily.    . simvastatin (ZOCOR) 20 MG tablet Take 20 mg by mouth every evening.    . venlafaxine XR (EFFEXOR-XR) 150 MG 24 hr capsule Take total of 187.5 (150+37.5) mg daily 90 capsule 0  . venlafaxine XR (EFFEXOR-XR) 37.5 MG 24 hr capsule Take total of 187.5 mg daily (150 mg + 37.5 mg) 90 capsule 0   No current facility-administered medications for this visit.      Musculoskeletal: Strength & Muscle Tone: within normal limits Gait & Station: normal Patient leans: N/A  Psychiatric Specialty Exam: ROS  There were no vitals taken for this visit.There is no height or weight on file to calculate BMI.  General Appearance: Fairly Groomed  Eye Contact:  Good  Speech:  Clear and Coherent  Volume:  Normal  Mood:  {BHH MOOD:22306}  Affect:  {Affect (PAA):22687}  Thought Process:  Coherent and Goal Directed  Orientation:  Full (Time, Place,  and Person)  Thought Content: Logical   Suicidal Thoughts:  {ST/HT (PAA):22692}  Homicidal Thoughts:  {ST/HT (PAA):22692}  Memory:  Immediate;   Fair Recent;   Good Remote;   Good  Judgement:  {Judgement (PAA):22694}  Insight:  {Insight (PAA):22695}  Psychomotor Activity:  Normal  Concentration:  Concentration: Good and Attention Span: Good  Recall:  Good  Fund of Knowledge: Good  Language: Good  Akathisia:  No  Handed:  Ambidextrous  AIMS (if indicated): not done  Assets:  Communication Skills Desire for Improvement  ADL's:  Intact  Cognition: WNL  Sleep:  {BHH GOOD/FAIR/POOR:22877}   Clock drawing 05/02/2016: 1/3 (wrote down number 1-7 and 12 only, unable to place hands in the clock. She placed her both hands on the paper, although she was instructed many times to draw clock hands) Delayed recall 2/3  [facility] 05/31/2016 8/30 (+1 for clock. Unable to write down clock hands and placed numbers in wrong position, +1 for naming, +1 for attention, +5 for orientation)  Head CT 04/2013 IMPRESSION: 1. No acute intracranial process identified. 2. Mild chronic microvascular ischemic disease, stable relative to prior study.  Screenings:   Assessment and Plan:  Carol Richmond is a 66 y.o. year old female with a history of anxiety, neurocognitive disorder, diabetes, hyperlipidemia, headache, non-epileptic episode of decreased responsiveness, recurrent left cholesteatoma, who presents for follow up appointment for No diagnosis found.   # Unspecified anxiety disorder # r/o GAD  There has been overall improvement in her anxiety since uptitration of Effexor. Will continue the current dose to target her anxiety. Will consider tapering down buspirone/gabapentin in the future to avoid polypharmacy. Although lamotrigine had been prescribed by other provider as mood stabilizer (not for seizure), this medication to be continued at this time given the patient and her daughter is concerned  about its effect on seizure.   # Mild neurocognitive disorder  Patient continues to demonstrate derailment in her thought process. MOCA score was low as well, although collaterals from both her husband and daughter with no significant impairment in her ADL/IADL (except they would not let her drive). No known treatable cause of dementia on labs (except anemia), CT/MRI a few years ago (microvascular ischemic change.) Will continue Aricept at this time for neurological deficit.  Discussed risk of nausea or vomiting. Although amitriptyline can cause cognitive deficits, she is advised to continue the current dose given it is effective for her headache.  Plan 1. Continue Aricept 5 mg daily 2. Continue Effexor 187.5 mg daily (37.5 mg +150 mg ) 3. Return to clinic in three months for 30 mins 4. Patient is advised to contact her primary care for anemia (Patient is on buspirone 15 mg BID, gabapentin 100 mg TID, lamotrigine 200 mg BID prescribed by her other provider) (Patient will ask her PCP to be prescribed on amitriptyline for headache)  The patient demonstrates the following risk factors for suicide: Chronic risk factors for suicide include: psychiatric disorder of depression. Acute risk factorsfor suicide include: unemployment. Protective factorsfor this patient include: positive social support and hope for the future. Considering these factors, the overall suicide risk at this point appears to be low. Patient isappropriate for outpatient follow up.      Norman Clay, MD 10/28/2016, 8:20 AM

## 2016-10-30 ENCOUNTER — Ambulatory Visit (HOSPITAL_COMMUNITY): Payer: PPO | Admitting: Psychiatry

## 2016-11-01 DIAGNOSIS — E119 Type 2 diabetes mellitus without complications: Secondary | ICD-10-CM | POA: Diagnosis not present

## 2016-11-01 DIAGNOSIS — J440 Chronic obstructive pulmonary disease with acute lower respiratory infection: Secondary | ICD-10-CM | POA: Diagnosis not present

## 2016-11-01 DIAGNOSIS — E871 Hypo-osmolality and hyponatremia: Secondary | ICD-10-CM | POA: Diagnosis not present

## 2016-11-01 DIAGNOSIS — Z6830 Body mass index (BMI) 30.0-30.9, adult: Secondary | ICD-10-CM | POA: Diagnosis not present

## 2016-11-01 DIAGNOSIS — I10 Essential (primary) hypertension: Secondary | ICD-10-CM | POA: Diagnosis not present

## 2016-11-01 DIAGNOSIS — K219 Gastro-esophageal reflux disease without esophagitis: Secondary | ICD-10-CM | POA: Diagnosis not present

## 2016-11-01 DIAGNOSIS — I5031 Acute diastolic (congestive) heart failure: Secondary | ICD-10-CM | POA: Diagnosis not present

## 2016-11-01 DIAGNOSIS — F3289 Other specified depressive episodes: Secondary | ICD-10-CM | POA: Diagnosis not present

## 2016-11-08 DIAGNOSIS — R0602 Shortness of breath: Secondary | ICD-10-CM | POA: Diagnosis not present

## 2016-11-08 DIAGNOSIS — R7989 Other specified abnormal findings of blood chemistry: Secondary | ICD-10-CM | POA: Diagnosis not present

## 2016-11-08 DIAGNOSIS — I700 Atherosclerosis of aorta: Secondary | ICD-10-CM | POA: Diagnosis not present

## 2016-11-08 DIAGNOSIS — J90 Pleural effusion, not elsewhere classified: Secondary | ICD-10-CM | POA: Diagnosis not present

## 2016-11-08 DIAGNOSIS — J810 Acute pulmonary edema: Secondary | ICD-10-CM | POA: Diagnosis not present

## 2016-11-08 DIAGNOSIS — J9601 Acute respiratory failure with hypoxia: Secondary | ICD-10-CM | POA: Diagnosis not present

## 2016-11-08 DIAGNOSIS — D649 Anemia, unspecified: Secondary | ICD-10-CM | POA: Diagnosis not present

## 2016-11-09 DIAGNOSIS — Z79899 Other long term (current) drug therapy: Secondary | ICD-10-CM | POA: Diagnosis not present

## 2016-11-09 DIAGNOSIS — Z8249 Family history of ischemic heart disease and other diseases of the circulatory system: Secondary | ICD-10-CM | POA: Diagnosis not present

## 2016-11-09 DIAGNOSIS — J3502 Chronic adenoiditis: Secondary | ICD-10-CM | POA: Diagnosis not present

## 2016-11-09 DIAGNOSIS — Z2821 Immunization not carried out because of patient refusal: Secondary | ICD-10-CM | POA: Diagnosis not present

## 2016-11-09 DIAGNOSIS — J441 Chronic obstructive pulmonary disease with (acute) exacerbation: Secondary | ICD-10-CM | POA: Diagnosis not present

## 2016-11-09 DIAGNOSIS — R569 Unspecified convulsions: Secondary | ICD-10-CM | POA: Diagnosis not present

## 2016-11-09 DIAGNOSIS — D649 Anemia, unspecified: Secondary | ICD-10-CM | POA: Diagnosis not present

## 2016-11-09 DIAGNOSIS — Z7951 Long term (current) use of inhaled steroids: Secondary | ICD-10-CM | POA: Diagnosis not present

## 2016-11-09 DIAGNOSIS — Z7984 Long term (current) use of oral hypoglycemic drugs: Secondary | ICD-10-CM | POA: Diagnosis not present

## 2016-11-09 DIAGNOSIS — J90 Pleural effusion, not elsewhere classified: Secondary | ICD-10-CM | POA: Diagnosis not present

## 2016-11-09 DIAGNOSIS — Z833 Family history of diabetes mellitus: Secondary | ICD-10-CM | POA: Diagnosis not present

## 2016-11-09 DIAGNOSIS — R7989 Other specified abnormal findings of blood chemistry: Secondary | ICD-10-CM | POA: Diagnosis not present

## 2016-11-09 DIAGNOSIS — I110 Hypertensive heart disease with heart failure: Secondary | ICD-10-CM | POA: Diagnosis not present

## 2016-11-09 DIAGNOSIS — E785 Hyperlipidemia, unspecified: Secondary | ICD-10-CM | POA: Diagnosis not present

## 2016-11-09 DIAGNOSIS — F319 Bipolar disorder, unspecified: Secondary | ICD-10-CM | POA: Diagnosis not present

## 2016-11-09 DIAGNOSIS — E119 Type 2 diabetes mellitus without complications: Secondary | ICD-10-CM | POA: Diagnosis not present

## 2016-11-09 DIAGNOSIS — N390 Urinary tract infection, site not specified: Secondary | ICD-10-CM | POA: Diagnosis not present

## 2016-11-09 DIAGNOSIS — J9601 Acute respiratory failure with hypoxia: Secondary | ICD-10-CM | POA: Diagnosis not present

## 2016-11-09 DIAGNOSIS — J449 Chronic obstructive pulmonary disease, unspecified: Secondary | ICD-10-CM | POA: Diagnosis not present

## 2016-11-09 DIAGNOSIS — R0602 Shortness of breath: Secondary | ICD-10-CM | POA: Diagnosis not present

## 2016-11-09 DIAGNOSIS — F0390 Unspecified dementia without behavioral disturbance: Secondary | ICD-10-CM | POA: Diagnosis not present

## 2016-11-09 DIAGNOSIS — I5033 Acute on chronic diastolic (congestive) heart failure: Secondary | ICD-10-CM | POA: Diagnosis not present

## 2016-11-09 DIAGNOSIS — I700 Atherosclerosis of aorta: Secondary | ICD-10-CM | POA: Diagnosis not present

## 2016-11-09 DIAGNOSIS — J810 Acute pulmonary edema: Secondary | ICD-10-CM | POA: Diagnosis not present

## 2016-11-09 DIAGNOSIS — K219 Gastro-esophageal reflux disease without esophagitis: Secondary | ICD-10-CM | POA: Diagnosis not present

## 2016-11-13 DIAGNOSIS — I5020 Unspecified systolic (congestive) heart failure: Secondary | ICD-10-CM | POA: Diagnosis not present

## 2016-11-13 DIAGNOSIS — Z96659 Presence of unspecified artificial knee joint: Secondary | ICD-10-CM | POA: Diagnosis not present

## 2016-11-13 DIAGNOSIS — J449 Chronic obstructive pulmonary disease, unspecified: Secondary | ICD-10-CM | POA: Diagnosis not present

## 2016-11-19 DIAGNOSIS — I5031 Acute diastolic (congestive) heart failure: Secondary | ICD-10-CM | POA: Diagnosis not present

## 2016-11-19 DIAGNOSIS — Z6830 Body mass index (BMI) 30.0-30.9, adult: Secondary | ICD-10-CM | POA: Diagnosis not present

## 2016-11-19 NOTE — Progress Notes (Signed)
[facility] MD/PA/NP OP Progress Note  11/21/2016 8:31 AM LASHAYE FISK  MRN:  [id number]  Chief Complaint:  Chief Complaint    Follow-up; Anxiety     HPI:  Patient presents for follow up appointment for anxiety. She states that she has been doing well. She was admitted due to shortness of breath. She felt scared when she could not breathe well. She feels better now. She was started on CPAP machine; she was told by her husband that she sleeps much better. She tends to stay at home. She denies any discordance with her husband, stating that they "help with each other." When she is asked about Effexor, she states that she is out of it as her PCP did not prescribe it, although he told her to continue medication. When she is informed this Probation officer had prescribed medication for three months, she then states that she takes Effexor. However, she later states again that the PCP did not prescribe this medication and states that she needs to talk with a pharmacist. She feels less anxious. She denies panic attacks. She denies feeling depressed. She denies SI, HI, AH, VH. She denies memory loss.     Wt Readings from Last 3 Encounters:  11/21/16 178 lb (80.7 kg)  08/16/16 180 lb (81.6 kg)  07/30/16 180 lb (81.6 kg)    Visit Diagnosis:    ICD-10-CM   1. Anxiety state F41.1   2. Mild neurocognitive disorder G31.84     Past Psychiatric History:  I have reviewed the patient's psychiatry history in detail and updated the patient record. Outpatient: used to see Dr. Sima Matas for "same thing" Psychiatry admission: denies Previous suicide attempt: denies Past trials of medication: lamotrigine 200 mg daily, amitriptyline 50 mg at night, buspirone 15 mg BID History of violence: denies  Past Medical History:  Past Medical History:  Diagnosis Date  . Asthma   . Diabetes mellitus without complication ([place])   . Diarrhea   . Diverticulosis of colon (without mention of hemorrhage)   . Functional ovarian cysts    . GERD (gastroesophageal reflux disease)   . Hemorrhoids, external, without mention of complication   . Hormone replacement therapy (postmenopausal)   . Hypertension   . IBS (irritable bowel syndrome)   . Migraine   . Right knee DJD   . Seizures ([place])   . Shortness of breath   . Sleep apnea    no cpap    study 2 yrs ago    Past Surgical History:  Procedure Laterality Date  . CHOLECYSTECTOMY  1980  . LEFT HEART CATHETERIZATION WITH CORONARY ANGIOGRAM N/A 11/18/2013   Procedure: LEFT HEART CATHETERIZATION WITH CORONARY ANGIOGRAM;  Surgeon: Burnell Blanks, MD;  Location: [facility] CATH LAB;  Service: Cardiovascular;  Laterality: N/A;  . OOPHORECTOMY     2012 Dr. Adah Perl in [place]  . TONSILLECTOMY    . TOTAL KNEE ARTHROPLASTY Right 09/02/2012   Procedure: TOTAL KNEE ARTHROPLASTY- right;  Surgeon: Ninetta Lights, MD;  Location: [place];  Service: Orthopedics;  Laterality: Right;  . TUBAL LIGATION    . TYMPANOMASTOIDECTOMY Left 04/02/2016   Procedure: LEFT CANAL WALL DOWN TYMPANOMASTOIDECTOMY;  Surgeon: Leta Baptist, MD;  Location: [facility];  Service: ENT;  Laterality: Left;  [name] VAGINAL HYSTERECTOMY  1990    Family Psychiatric History:  I have reviewed the patient's family history in detail and updated the patient record.  Family History:  Family History  Problem Relation Age of Onset  . Heart disease  Mother   . Diabetes Mother   . Heart disease Brother   . Cancer Sister   . Heart disease Brother   . Heart disease Brother     Social History:  Social History   Social History  . Marital status: Married    Spouse name: N/A  . Number of children: N/A  . Years of education: N/A   Social History Main Topics  . Smoking status: Never Smoker  . Smokeless tobacco: Never Used  . Alcohol use No  . Drug use: No  . Sexual activity: Not Asked   Other Topics Concern  . None   Social History Narrative  . None   Grew up in [place], lives in [place] She lives with  her husband of 41 years. She has three children She reports her father passed away when she was 67 years old, and has difficult relationship with her mother who was "mean." Work: on disability for four years due to seizure, used to work at [employer] for 17 years Education: 9th grade  Allergies: No Known Allergies  Metabolic Disorder Labs: Lab Results  Component Value Date   HGBA1C 6.2 (H) 09/02/2012   MPG 131 (H) 09/02/2012   No results found for: PROLACTIN Lab Results  Component Value Date   CHOL  05/08/2007    196        ATP III CLASSIFICATION:  <200     mg/dL   Desirable  200-239  mg/dL   Borderline High  >=240    mg/dL   High   TRIG 192 (H) 05/08/2007   HDL 38 (L) 05/08/2007   CHOLHDL 5.2 05/08/2007   VLDL 38 05/08/2007   LDLCALC (H) 05/08/2007    120        Total Cholesterol/HDL:CHD Risk Coronary Heart Disease Risk Table                     Men   Women  1/2 Average Risk   3.4   3.3     Therapeutic Level Labs: No results found for: LITHIUM No results found for: VALPROATE No components found for:  CBMZ  Current Medications: Current Outpatient Prescriptions  Medication Sig Dispense Refill  . acyclovir (ZOVIRAX) 800 MG tablet Take 800 mg by mouth daily.     [name] albuterol (PROVENTIL HFA;VENTOLIN HFA) 108 (90 BASE) MCG/ACT inhaler Inhale 2 puffs into the lungs every 6 (six) hours as needed for wheezing or shortness of breath. 1 Inhaler 3  . amitriptyline (ELAVIL) 50 MG tablet Take 50 mg by mouth at bedtime.    . Black Cohosh 40 MG CAPS Take by mouth daily.    . busPIRone (BUSPAR) 15 MG tablet Take 2 tablets by mouth twice daily    . cholecalciferol (VITAMIN D) 1000 UNITS tablet Take 1,000 Units by mouth daily.    [name] donepezil (ARICEPT) 5 MG tablet Take 1 tablet (5 mg total) by mouth at bedtime. 90 tablet 0  . Fluticasone Furoate-Vilanterol (BREO ELLIPTA IN) Inhale into the lungs.    . gabapentin (NEURONTIN) 100 MG capsule Take 100 mg by mouth 3 (three) times daily.     . Insulin Lispro, Human, (HUMALOG KWIKPEN [place]) Inject 2.5 Units into the skin 3 (three) times daily.     [name] lamoTRIgine (LAMICTAL) 200 MG tablet Take 200 mg by mouth 2 (two) times daily.     [name] levocetirizine (XYZAL) 5 MG tablet Take 5 mg by mouth every evening.    [name] lisinopril-hydrochlorothiazide (  PRINZIDE,ZESTORETIC) 20-12.5 MG per tablet Take 1 tablet by mouth daily.     . meloxicam (MOBIC) 7.5 MG tablet Take 7.5 mg by mouth daily.    . metFORMIN (GLUCOPHAGE) 1000 MG tablet Take 1,000 mg by mouth 2 (two) times daily with a meal.    . metoprolol succinate (TOPROL XL) 50 MG 24 hr tablet Take 1 tablet (50 mg total) by mouth daily. 30 tablet 6  . omeprazole (PRILOSEC) 20 MG capsule Take 20 mg by mouth daily.    . simvastatin (ZOCOR) 20 MG tablet Take 20 mg by mouth every evening.    . venlafaxine XR (EFFEXOR-XR) 150 MG 24 hr capsule Take total of 187.5 (150+37.5) mg daily 90 capsule 0  . venlafaxine XR (EFFEXOR-XR) 37.5 MG 24 hr capsule Take total of 187.5 mg daily (150 mg + 37.5 mg) 90 capsule 0   No current facility-administered medications for this visit.      Musculoskeletal: Strength & Muscle Tone: within normal limits Gait & Station: normal Patient leans: N/A  Psychiatric Specialty Exam: Review of Systems  Psychiatric/Behavioral: Negative for depression, hallucinations, memory loss, substance abuse and suicidal ideas. The patient is not nervous/anxious and does not have insomnia.   All other systems reviewed and are negative.   Blood pressure 101/60, pulse 98, height 5' 4"  (1.626 m), weight 178 lb (80.7 kg).Body mass index is 30.55 kg/m.  General Appearance: Fairly Groomed  Eye Contact:  Good  Speech:  Clear and Coherent  Volume:  Normal  Mood:  "good"  Affect:  Appropriate, Congruent and Full Range  Thought Process:  Linear, illogical and inconsistent at times  Orientation:  Full (Time, Place, and Person)  Thought Content: no parnaoia  Perceptions: denies AH/VH  Suicidal  Thoughts:  No  Homicidal Thoughts:  No  Memory:  Immediate;   Good Recent;   Good Remote;   Good  Judgement:  Fair  Insight:  Present  Psychomotor Activity:  Normal  Concentration:  Concentration: Good and Attention Span: Good  Recall:  Good  Fund of Knowledge: Good  Language: Good  Akathisia:  No  Handed:  Right  AIMS (if indicated): not done  Assets:  Communication Skills Desire for Improvement  ADL's:  Intact  Cognition: WNL  Sleep:  Good   Screenings: Clock drawing 05/02/2016: 1/3 (wrote down number 1-7 and 12 only, unable to place hands in the clock. She placed her both hands on the paper, although she was instructed many times to draw clock hands) Delayed recall 2/3  [facility] 05/31/2016 8/30 (+1 for clock. Unable to write down clock hands and placed numbers in wrong position, +1 for naming, +1 for attention, +5 for orientation)  Head CT 04/2013 IMPRESSION: 1. No acute intracranial process identified. 2. Mild chronic microvascular ischemic disease, stable relative to prior study.  Assessment and Plan:  Carol Richmond is a 66 y.o. year old female with a history of depression, diabetes, hyperlipidemia,   headaches, non-epileptic episodes of decreased responsiveness followed by neurologist, recurrent left cholesteatoma, who presents for follow up appointment for Anxiety state  Mild neurocognitive disorder  # Unspecified anxiety disorder # r/o GAD There has been improvement in anxiety since uptitration of Effexor. Will continue current dose to target anxiety. Noted that there is some question of medication adherence (she provides inconsistent history); she is advised to bring her daughter at the next appointment. We may try taper off buspirone, gabapentin, lamotrigine, prescribed by PCP to avoid polypharmacy; will discuss with her and her  daughter at the next visit.   # Mild neurocognitive disorder Exam is notable for illogical thought process at times and MOCA score is  significantly low as above. ADL/IADL independent (except she does not drive) per collateral. However, there is a concern of medication adherence as above; will obtain collateral from her daughter at the next visit. Will continue aricept for dementia.  No known treatable cause of dementia on labs (except anemia), CT/MRI a few years ago (microvascular ischemic change.)   Plan 1. Continue Aricept 5 mg daily 2. Continue Effexor 187.5 mg daily (37.5 mg +150 mg) 3. Return to clinic in three months for 30 mins. Please come with your daughter if possible (Patient is on buspirone 15 mg BID, gabapentin 100 mg TID, lamotrigine 200 mg BID prescribed by her other provider) - she is on amitriptyline for headache (Patient will ask her PCP to be prescribed on amitriptyline for headache)  The duration of this appointment visit was 30 minutes of face-to-face time with the patient.  Greater than 50% of this time was spent in counseling, explanation of  diagnosis, planning of further management, and coordination of care. Norman Clay, MD 11/21/2016, 8:31 AM

## 2016-11-21 ENCOUNTER — Ambulatory Visit (INDEPENDENT_AMBULATORY_CARE_PROVIDER_SITE_OTHER): Payer: PPO | Admitting: Psychiatry

## 2016-11-21 ENCOUNTER — Encounter (HOSPITAL_COMMUNITY): Payer: Self-pay | Admitting: Psychiatry

## 2016-11-21 VITALS — BP 101/60 | HR 98 | Ht 64.0 in | Wt 178.0 lb

## 2016-11-21 DIAGNOSIS — F419 Anxiety disorder, unspecified: Secondary | ICD-10-CM | POA: Diagnosis not present

## 2016-11-21 DIAGNOSIS — Z79899 Other long term (current) drug therapy: Secondary | ICD-10-CM | POA: Diagnosis not present

## 2016-11-21 DIAGNOSIS — R51 Headache: Secondary | ICD-10-CM | POA: Diagnosis not present

## 2016-11-21 DIAGNOSIS — G3184 Mild cognitive impairment, so stated: Secondary | ICD-10-CM

## 2016-11-21 DIAGNOSIS — G40909 Epilepsy, unspecified, not intractable, without status epilepticus: Secondary | ICD-10-CM | POA: Diagnosis not present

## 2016-11-21 DIAGNOSIS — E785 Hyperlipidemia, unspecified: Secondary | ICD-10-CM | POA: Diagnosis not present

## 2016-11-21 DIAGNOSIS — E119 Type 2 diabetes mellitus without complications: Secondary | ICD-10-CM

## 2016-11-21 DIAGNOSIS — F411 Generalized anxiety disorder: Secondary | ICD-10-CM

## 2016-11-21 MED ORDER — DONEPEZIL HCL 5 MG PO TABS: 5.0000 mg | ORAL_TABLET | Freq: Every day | ORAL | 0 refills | Status: DC

## 2016-11-21 MED ORDER — VENLAFAXINE HCL ER 37.5 MG PO CP24: ORAL_CAPSULE | ORAL | 0 refills | Status: DC

## 2016-11-21 MED ORDER — VENLAFAXINE HCL ER 150 MG PO CP24: ORAL_CAPSULE | ORAL | 0 refills | Status: DC

## 2016-11-21 NOTE — Patient Instructions (Addendum)
1. Continue Aricept 5 mg daily 2. Continue Effexor 187.5 mg daily (37.5 mg +150 mg) 3. Above medication is ordered for 90 days 3. Return to clinic in three months for 30 mins. Please come with your daughter if possible

## 2016-11-26 DIAGNOSIS — H3582 Retinal ischemia: Secondary | ICD-10-CM | POA: Diagnosis not present

## 2016-11-26 DIAGNOSIS — H35041 Retinal micro-aneurysms, unspecified, right eye: Secondary | ICD-10-CM | POA: Diagnosis not present

## 2016-11-26 DIAGNOSIS — E119 Type 2 diabetes mellitus without complications: Secondary | ICD-10-CM | POA: Diagnosis not present

## 2016-11-26 DIAGNOSIS — H348111 Central retinal vein occlusion, right eye, with retinal neovascularization: Secondary | ICD-10-CM | POA: Diagnosis not present

## 2016-12-02 ENCOUNTER — Encounter: Payer: Self-pay | Admitting: Neurology

## 2016-12-02 ENCOUNTER — Ambulatory Visit (INDEPENDENT_AMBULATORY_CARE_PROVIDER_SITE_OTHER): Payer: PPO | Admitting: Neurology

## 2016-12-02 VITALS — BP 130/62 | HR 114 | Ht 64.0 in | Wt 179.0 lb

## 2016-12-02 DIAGNOSIS — R29898 Other symptoms and signs involving the musculoskeletal system: Secondary | ICD-10-CM

## 2016-12-02 DIAGNOSIS — G43009 Migraine without aura, not intractable, without status migrainosus: Secondary | ICD-10-CM

## 2016-12-02 MED ORDER — AMITRIPTYLINE HCL 50 MG PO TABS: 50.0000 mg | ORAL_TABLET | Freq: Every day | ORAL | 3 refills | Status: DC

## 2016-12-02 NOTE — Progress Notes (Signed)
NEUROLOGY FOLLOW UP OFFICE NOTE  Carol Richmond [id number]  HISTORY OF PRESENT ILLNESS: I had the pleasure of seeing Carol Richmond in follow-up in the neurology clinic on 12/02/2016.  The patient was last seen 3 months ago. She is again accompanied by her husband who helps supplement the history today. I had previously been seeing her for headaches. She was initially having them on a daily basis, she has had good response to amitriptyline and reports headaches are not occurring daily and controlled on medication. She presented on her last visit due to new symptoms of intermittent left leg weakness. She reported her left knee "just gives way." There is no back or knee pain. She had an MRI lumbar spine which I personally reviewed, there was mild lumbar degenerative change, no disc protrusion or nerve impingement seen. EMG/NCV of the left leg was normal, no neuropathy or radiculopathy seen. She initially denied any jerking, but today reports that she would be walking then feel it coming on, her left leg would give way and jerk for a few minutes. She reports that last Saturday, she was holding something in her left hand and kept dropping it as well. She was initially seen in 2015 for episodes of loss of consciousness that were non-epileptic, these are not occurring anymore, husband denies any unresponsiveness when she has the leg symptoms. She has been seeing psychiatry and reports mood is improved with medication changes. She takes aspirin daily.  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 Records from her neurologist from 2009 to 2013 were reviewed. She was having episodes of abnormal smell like inhaling anesthesia, followed by breathing problems, numbness, disorientation, memory loss, difficulty finding words, and mild staggering gait. She was started on Keppra, then switched to Topamax. A  48-hour EEG in 2010 captured 2 typical spells with no associated EEG changes. Baseline EEG was normal. She started seeing a psychologist and psychiatrist, Topamax was tapered and she was started on Lamictal for mood stabilization. She was told by the psychologist that she has "absent seizures" and that "the left side of her brain is getting worse, and this could be affected by her sleep apnea." She apparently had been seen by a neurologist at [facility] who felt the episodes of unresponsiveness could be due to sleep attacks. She has a diagnosis of obstructive sleep apnea. It appears the spells resolved in 2011.   Since November 2014 or so, she has had episodes of feeling off balance with speech difficulties where speech is slurred and she is unable to find appropriate words. They were standing at [facility] then she started rocking, could not keep her balance, then felt her speech was going. They were at a birthday party on 05/09/13 when it looked like she was waking up, then started "acting like a drunk person." She has been having episodes of blanking out "like she is out in space," occurring 2-3 times a week. She states these are different from the spells she had in 2009. Her husband reports that each time is worse than the other. She has been having headaches on a near daily basis. Headaches are over the vertex or bilateral temporal regions. She hears a "boom" inside her head and wants to pull her hair out. There is no associated nausea/vomiting/photo or phonophobia. She asked if these episodes can be caused by stress, reporting that she has been having more episodes since she  has been taking care of her sister the past 3 months. She would "cry over nothing." She has been taking Lexapro for many years and feels that "it does not seem to be working." She takes Ativan 54m twice a day for anxiety.   Diagnostic Data: I personally reviewed MRI brain without contrast which did not show any acute changes, there was  moderate chronic white matter microvascular disease, hippocampi symmetric with no abnormal signal seen. Her 24-hour EEG was mildly abnormal due to mild diffuse slowing of the background, no epileptiform discharges or electrographic seizures seen. She did not complete diary, but tells me today that she did have an episode where she felt she had a seizure and looked like she fell asleep.   PAST MEDICAL HISTORY: Past Medical History:  Diagnosis Date  . Asthma   . Diabetes mellitus without complication ([place]   . Diarrhea   . Diverticulosis of colon (without mention of hemorrhage)   . Functional ovarian cysts   . GERD (gastroesophageal reflux disease)   . Hemorrhoids, external, without mention of complication   . Hormone replacement therapy (postmenopausal)   . Hypertension   . IBS (irritable bowel syndrome)   . Migraine   . Right knee DJD   . Seizures ([place]   . Shortness of breath   . Sleep apnea    no cpap    study 2 yrs ago    MEDICATIONS:  Outpatient Encounter Prescriptions as of 12/02/2016  Medication Sig  . acyclovir (ZOVIRAX) 800 MG tablet Take 800 mg by mouth daily.   .Marland Kitchenalbuterol (PROVENTIL HFA;VENTOLIN HFA) 108 (90 BASE) MCG/ACT inhaler Inhale 2 puffs into the lungs every 6 (six) hours as needed for wheezing or shortness of breath.  .Marland Kitchenamitriptyline (ELAVIL) 50 MG tablet Take 50 mg by mouth at bedtime.  . Black Cohosh 40 MG CAPS Take by mouth daily.  . busPIRone (BUSPAR) 15 MG tablet Take 2 tablets by mouth twice daily  . cholecalciferol (VITAMIN D) 1000 UNITS tablet Take 1,000 Units by mouth daily.  .Marland Kitchendonepezil (ARICEPT) 5 MG tablet Take 1 tablet (5 mg total) by mouth at bedtime.  . Fluticasone Furoate-Vilanterol (BREO ELLIPTA IN) Inhale into the lungs.  . gabapentin (NEURONTIN) 100 MG capsule Take 100 mg by mouth 3 (three) times daily.  . Insulin Lispro, Human, (HUMALOG KWIKPEN [place]) Inject 2.5 Units into the skin 3 (three) times daily.   .Marland KitchenlamoTRIgine (LAMICTAL) 200 MG tablet  Take 200 mg by mouth 2 (two) times daily.   .Marland Kitchenlevocetirizine (XYZAL) 5 MG tablet Take 5 mg by mouth every evening.  .Marland Kitchenlisinopril-hydrochlorothiazide (PRINZIDE,ZESTORETIC) 20-12.5 MG per tablet Take 1 tablet by mouth daily.   . meloxicam (MOBIC) 7.5 MG tablet Take 7.5 mg by mouth daily.  . metFORMIN (GLUCOPHAGE) 1000 MG tablet Take 1,000 mg by mouth 2 (two) times daily with a meal.  . metoprolol succinate (TOPROL XL) 50 MG 24 hr tablet Take 1 tablet (50 mg total) by mouth daily.  .Marland Kitchenomeprazole (PRILOSEC) 20 MG capsule Take 20 mg by mouth daily.  . simvastatin (ZOCOR) 20 MG tablet Take 20 mg by mouth every evening.  . venlafaxine XR (EFFEXOR-XR) 150 MG 24 hr capsule Take total of 187.5 (150+37.5) mg daily  . venlafaxine XR (EFFEXOR-XR) 37.5 MG 24 hr capsule Take total of 187.5 mg daily (150 mg + 37.5 mg)   No facility-administered encounter medications on file as of 12/02/2016.      ALLERGIES: No Known Allergies  FAMILY HISTORY:  Family History  Problem Relation Age of Onset  . Heart disease Mother   . Diabetes Mother   . Heart disease Brother   . Cancer Sister   . Heart disease Brother   . Heart disease Brother     SOCIAL HISTORY: Social History   Social History  . Marital status: Married    Spouse name: N/A  . Number of children: N/A  . Years of education: N/A   Occupational History  . Not on file.   Social History Main Topics  . Smoking status: Never Smoker  . Smokeless tobacco: Never Used  . Alcohol use No  . Drug use: No  . Sexual activity: Not on file   Other Topics Concern  . Not on file   Social History Narrative  . No narrative on file    REVIEW OF SYSTEMS: Constitutional: No fevers, chills, or sweats, no generalized fatigue, change in appetite Eyes: No visual changes, double vision, eye pain Ear, nose and throat: No hearing loss, ear pain, nasal congestion, sore throat Cardiovascular: No chest pain, palpitations Respiratory:  No shortness of breath at  rest or with exertion, wheezes GastrointestinaI: No nausea, vomiting, diarrhea, abdominal pain, fecal incontinence Genitourinary:  No dysuria, urinary retention or frequency Musculoskeletal:  No neck pain, back pain Integumentary: No rash, pruritus, skin lesions Neurological: as above Psychiatric: + depression, no insomnia, + anxiety Endocrine: No palpitations, fatigue, diaphoresis, mood swings, change in appetite, change in weight, increased thirst Hematologic/Lymphatic:  No anemia, purpura, petechiae. Allergic/Immunologic: no itchy/runny eyes, nasal congestion, recent allergic reactions, rashes  PHYSICAL EXAM: Vitals:   12/02/16 0828  BP: 130/62  Pulse: (!) 114  SpO2: 92%   No data found.  General: No acute distress Head:  Normocephalic/atraumatic Neck: supple, no paraspinal tenderness, full range of motion Heart:  Regular rate and rhythm Lungs:  Clear to auscultation bilaterally Back: No paraspinal tenderness Skin/Extremities: No rash, no edema Neurological Exam: alert and oriented to person, place, and time. No aphasia or dysarthria. Fund of knowledge is appropriate.  Recent and remote memory are intact. 3/3 delayed recall.  Attention and concentration are normal.    Able to name objects and repeat phrases. Cranial nerves: Pupils equal, round, reactive to light.  Extraocular movements intact with no nystagmus. Visual fields full. Facial sensation intact. No facial asymmetry. Tongue, uvula, palate midline.  Motor: Bulk and tone normal, muscle strength 5/5 throughout with no pronator drift.  Sensation to light touch, pin, vibration intact.  No extinction to double simultaneous stimulation.  Deep tendon reflexes: +2 on both UE, unable to elicit right patella reflex, +2 on left patella, both ankles, toes downgoing.  Finger to nose testing intact.  Gait narrow-based, appears to be favoring left knee but denies any pain. Romberg negative.  IMPRESSION: This is a 66 yo RH woman with a  history of diabetes, hyperlipidemia, headaches, and non-epileptic episodes of decreased responsiveness, who initially presented with episodes of feeling off balance with slurred speech. MRI brain and 24-hour EEG unremarkable. No further episodes of decreased responsiveness. Headaches are fairly controlled on amitriptyline 61m qhs, refills sent. She knows to minimize Tylenol intake to 2-3 a week to avoid rebound headaches. She continues to report recurrent episodes of left leg giving way. MRI lumbar spine and EMG/NCV of left leg was unremarkable. Last Saturday she had left hand weakness as well. MRI brain with and without contrast will be ordered to assess for underlying structural abnormality. MRA head without contrast will be ordered to assess  for intracranial stenosis potentially causing hypoperfusion symptoms. Due to report of left leg jerking, routine EEG will be done as well. On last visit, orthostatic tremor is also considered as she reports feeling shaky prior to leg symptoms, and she had a 20-point drop in BP from sitting to standing but was asymptomatic in the office.This continues to be in the differentials. Continue using walker/cane. She will follow-up after the tests and knows to call for any changes.   Thank you for allowing me to participate in her care.  Please do not hesitate to call for any questions or concerns.  The duration of this appointment visit was 25 minutes of face-to-face time with the patient.  Greater than 50% of this time was spent in counseling, explanation of diagnosis, planning of further management, and coordination of care.   Ellouise Newer, M.D.   CC: Dr. Sherrie Sport

## 2016-12-02 NOTE — Patient Instructions (Addendum)
1. Schedule MRI brain with and without contrast 2. Schedule MRA head without contrast  We have referred you to [facility] for your OPEN MRI on.  [facility] will contact you to schedule.  Please arrive 30 minutes prior and go to [place]. If you need to reschedule for any reason please call [phone].  Once you have your appointment information, please contact our office and [facility] will send anti-anxiety medications to your pharmacy, if needed.   3. Schedule routine EEG 4. Continue amitriptyline 49m daily 5. Follow-up after tests, call for any changes

## 2016-12-04 ENCOUNTER — Ambulatory Visit (INDEPENDENT_AMBULATORY_CARE_PROVIDER_SITE_OTHER): Payer: PPO | Admitting: Neurology

## 2016-12-04 DIAGNOSIS — R29898 Other symptoms and signs involving the musculoskeletal system: Secondary | ICD-10-CM

## 2016-12-04 DIAGNOSIS — G43009 Migraine without aura, not intractable, without status migrainosus: Secondary | ICD-10-CM

## 2016-12-06 NOTE — Procedures (Signed)
ELECTROENCEPHALOGRAM REPORT  Date of Study: 12/04/2016  Patient's Name: Carol Richmond MRN: [id number] Date of Birth: 08/22/1950  Referring Provider: Dr. Ellouise Newer  Clinical History: This is a 66 year old woman with recurrent episodes of left leg and arm weakness, with report of leg jerking.  Medications: ZOVIRAX 800 MG tablet PROVENTIL HFA;VENTOLIN HFA 108 (90 BASE) MCG/ACT inhaler ELAVIL 50 MG tablet Black Cohosh 40 MG CAPS  BUSPAR 15 MG tablet VITAMIN D 1000 UNITS tablet  ARICEPT 5 MG tablet BREO ELLIPTA IN Inhale into the lungs. NEURONTIN 100 MG capsule HUMALOG KWIKPEN [place] Inject 2.5 Units into the skin 3 (three) times daily.  LAMICTAL 200 MG tablet XYZAL 5 MG tablet PRINZIDE,ZESTORETIC 20-12.5 MG per tablet  MOBIC 7.5 MG tablet GLUCOPHAGE 1000 MG tablet  TOPROL XL 50 MG 24 hr tablet  PRILOSEC 20 MG capsule  ZOCOR 20 MG tablet EFFEXOR-XR 37.5 MG 24 hr capsule  Technical Summary: A multichannel digital EEG recording measured by the international 10-20 system with electrodes applied with paste and impedances below 5000 ohms performed as portable with EKG monitoring in an awake and drowsy patient.  Hyperventilation was not performed. Photic stimulation was performed.  The digital EEG was referentially recorded, reformatted, and digitally filtered in a variety of bipolar and referential montages for optimal display.   Description: The patient is awake and drowsy during the recording.  During maximal wakefulness, there is a symmetric, medium voltage 7 Hz posterior dominant rhythm that attenuates with eye opening. During drowsiness, there is an increase in theta slowing of the background.  Deeper stages of sleep were not seen. Photic stimulation did not elicit any abnormalities.  There were no epileptiform discharges or electrographic seizures seen.    EKG lead was unremarkable.  Impression: This awake and drowsy EEG is abnormal due to slowing of the posterior dominant  rhythm.  Clinical Correlation of the above findings indicates diffuse cerebral dysfunction that is non-specific in etiology and can be seen with hypoxic/ischemic injury, toxic/metabolic encephalopathies, neurodegenerative disorders, medication effect, or due to excessive drowsiness.  The absence of epileptiform discharges does not rule out a clinical diagnosis of epilepsy.  Clinical correlation is advised.   Ellouise Newer, M.D.

## 2016-12-09 ENCOUNTER — Ambulatory Visit (INDEPENDENT_AMBULATORY_CARE_PROVIDER_SITE_OTHER): Payer: PPO | Admitting: Otolaryngology

## 2016-12-09 DIAGNOSIS — H95122 Granulation of postmastoidectomy cavity, left ear: Secondary | ICD-10-CM

## 2016-12-13 ENCOUNTER — Telehealth: Payer: Self-pay

## 2016-12-13 DIAGNOSIS — Z96659 Presence of unspecified artificial knee joint: Secondary | ICD-10-CM | POA: Diagnosis not present

## 2016-12-13 DIAGNOSIS — J449 Chronic obstructive pulmonary disease, unspecified: Secondary | ICD-10-CM | POA: Diagnosis not present

## 2016-12-13 DIAGNOSIS — I5020 Unspecified systolic (congestive) heart failure: Secondary | ICD-10-CM | POA: Diagnosis not present

## 2016-12-13 NOTE — Telephone Encounter (Signed)
Spoke with pt relaying message below.  She states that she does not know if [facility] has contacted her or not as she does not listen to her messages and has been "in and out every day lately".  Advised that I could give her [place] phone number so she can call and schedule herself, she replied "no, no, you go ahead and call and get it scheduled.  I just can't go on Monday, or November the 1st."

## 2016-12-13 NOTE — Telephone Encounter (Signed)
-----   Message from Cameron Sprang, MD sent at 12/10/2016 12:41 PM EDT ----- Pls let her know the EEG did not show any seizure discharges. Proceed with MRI as discussed. Thanks

## 2016-12-24 DIAGNOSIS — I5020 Unspecified systolic (congestive) heart failure: Secondary | ICD-10-CM | POA: Diagnosis not present

## 2016-12-24 DIAGNOSIS — Z6829 Body mass index (BMI) 29.0-29.9, adult: Secondary | ICD-10-CM | POA: Diagnosis not present

## 2016-12-24 DIAGNOSIS — Z96659 Presence of unspecified artificial knee joint: Secondary | ICD-10-CM | POA: Diagnosis not present

## 2016-12-24 DIAGNOSIS — J449 Chronic obstructive pulmonary disease, unspecified: Secondary | ICD-10-CM | POA: Diagnosis not present

## 2016-12-24 DIAGNOSIS — J40 Bronchitis, not specified as acute or chronic: Secondary | ICD-10-CM | POA: Diagnosis not present

## 2016-12-24 DIAGNOSIS — I5031 Acute diastolic (congestive) heart failure: Secondary | ICD-10-CM | POA: Diagnosis not present

## 2017-01-13 DIAGNOSIS — J449 Chronic obstructive pulmonary disease, unspecified: Secondary | ICD-10-CM | POA: Diagnosis not present

## 2017-01-13 DIAGNOSIS — Z96659 Presence of unspecified artificial knee joint: Secondary | ICD-10-CM | POA: Diagnosis not present

## 2017-01-13 DIAGNOSIS — I5020 Unspecified systolic (congestive) heart failure: Secondary | ICD-10-CM | POA: Diagnosis not present

## 2017-02-12 DIAGNOSIS — I5020 Unspecified systolic (congestive) heart failure: Secondary | ICD-10-CM | POA: Diagnosis not present

## 2017-02-12 DIAGNOSIS — J449 Chronic obstructive pulmonary disease, unspecified: Secondary | ICD-10-CM | POA: Diagnosis not present

## 2017-02-12 DIAGNOSIS — Z96659 Presence of unspecified artificial knee joint: Secondary | ICD-10-CM | POA: Diagnosis not present

## 2017-02-19 NOTE — Progress Notes (Deleted)
[place] MD/PA/NP OP Progress Note  02/19/2017 9:15 AM VELEKA Richmond  MRN:  [id number]  Chief Complaint:  HPI:  -Per chart review , patient was seen by neurology for intermittent left leg weakness since the last visit. EEG ("This awake and drowsy EEG is abnormal due to slowing of the posterior dominant rhythm. Clinical Correlation of the above findings indicates diffuse cerebral dysfunction that is non-specific in etiology and can be seen with hypoxic/ischemic injury, toxic/metabolic encephalopathies, neurodegenerative disorders, medication effect, or due to excessive drowsiness.")  head MRI pending   Visit Diagnosis: No diagnosis found.  Past Psychiatric History:  I have reviewed the patient's psychiatry history in detail and updated the patient record. Outpatient: used to see Dr. Sima Matas for "same thing" Psychiatry admission: denies Previous suicide attempt: denies Past trials of medication: lamotrigine 200 mg daily, amitriptyline 50 mg at night, buspirone 15 mg BID History of violence: denies    Past Medical History:  Past Medical History:  Diagnosis Date  . Asthma   . Diabetes mellitus without complication ([place])   . Diarrhea   . Diverticulosis of colon (without mention of hemorrhage)   . Functional ovarian cysts   . GERD (gastroesophageal reflux disease)   . Hemorrhoids, external, without mention of complication   . Hormone replacement therapy (postmenopausal)   . Hypertension   . IBS (irritable bowel syndrome)   . Migraine   . Right knee DJD   . Seizures ([place])   . Shortness of breath   . Sleep apnea    no cpap    study 2 yrs ago    Past Surgical History:  Procedure Laterality Date  . CHOLECYSTECTOMY  1980  . LEFT HEART CATHETERIZATION WITH CORONARY ANGIOGRAM N/A 11/18/2013   Procedure: LEFT HEART CATHETERIZATION WITH CORONARY ANGIOGRAM;  Surgeon: Burnell Blanks, MD;  Location: [facility] CATH LAB;  Service: Cardiovascular;  Laterality: N/A;  . OOPHORECTOMY     2012 Dr. Adah Perl in [place]  . TONSILLECTOMY    . TOTAL KNEE ARTHROPLASTY Right 09/02/2012   Procedure: TOTAL KNEE ARTHROPLASTY- right;  Surgeon: Ninetta Lights, MD;  Location: [place];  Service: Orthopedics;  Laterality: Right;  . TUBAL LIGATION    . TYMPANOMASTOIDECTOMY Left 04/02/2016   Procedure: LEFT CANAL WALL DOWN TYMPANOMASTOIDECTOMY;  Surgeon: Leta Baptist, MD;  Location: [place];  Service: ENT;  Laterality: Left;  [name] VAGINAL HYSTERECTOMY  1990    Family Psychiatric History: I have reviewed the patient's family history in detail and updated the patient record.  Family History:  Family History  Problem Relation Age of Onset  . Heart disease Mother   . Diabetes Mother   . Heart disease Brother   . Cancer Sister   . Heart disease Brother   . Heart disease Brother     Social History:  Social History   Socioeconomic History  . Marital status: Married    Spouse name: Not on file  . Number of children: Not on file  . Years of education: Not on file  . Highest education level: Not on file  Social Needs  . Financial resource strain: Not on file  . Food insecurity - worry: Not on file  . Food insecurity - inability: Not on file  . Transportation needs - medical: Not on file  . Transportation needs - non-medical: Not on file  Occupational History  . Not on file  Tobacco Use  . Smoking status: Never Smoker  . Smokeless tobacco: Never Used  Substance and  Sexual Activity  . Alcohol use: No    Alcohol/week: 0.0 oz  . Drug use: No  . Sexual activity: Not on file  Other Topics Concern  . Not on file  Social History Narrative  . Not on file   Grew up in [place], lives in [place] She lives with her husband of 47 years. She has three children She reports her father passed away when she was 65 years old, and has difficult relationship with her mother who was "mean." Work: on disability for four years due to seizure, used to work at [employer] for 17 years Education:  9th grade    Allergies: No Known Allergies  Metabolic Disorder Labs: Lab Results  Component Value Date   HGBA1C 6.2 (H) 09/02/2012   MPG 131 (H) 09/02/2012   No results found for: PROLACTIN Lab Results  Component Value Date   CHOL  05/08/2007    196        ATP III CLASSIFICATION:  <200     mg/dL   Desirable  200-239  mg/dL   Borderline High  >=240    mg/dL   High   TRIG 192 (H) 05/08/2007   HDL 38 (L) 05/08/2007   CHOLHDL 5.2 05/08/2007   VLDL 38 05/08/2007   LDLCALC (H) 05/08/2007    120        Total Cholesterol/HDL:CHD Risk Coronary Heart Disease Risk Table                     Men   Women  1/2 Average Risk   3.4   3.3   Lab Results  Component Value Date   TSH 1.33 05/02/2016   TSH 1.118 ***Test methodology is 3rd generation TSH*** 05/08/2007    Therapeutic Level Labs: No results found for: LITHIUM No results found for: VALPROATE No components found for:  CBMZ  Current Medications: Current Outpatient Medications  Medication Sig Dispense Refill  . acyclovir (ZOVIRAX) 800 MG tablet Take 800 mg by mouth daily.     [name] albuterol (PROVENTIL HFA;VENTOLIN HFA) 108 (90 BASE) MCG/ACT inhaler Inhale 2 puffs into the lungs every 6 (six) hours as needed for wheezing or shortness of breath. 1 Inhaler 3  . amitriptyline (ELAVIL) 50 MG tablet Take 1 tablet (50 mg total) by mouth at bedtime. 90 tablet 3  . Black Cohosh 40 MG CAPS Take by mouth daily.    . busPIRone (BUSPAR) 15 MG tablet Take 2 tablets by mouth twice daily    . cholecalciferol (VITAMIN D) 1000 UNITS tablet Take 1,000 Units by mouth daily.    [name] donepezil (ARICEPT) 5 MG tablet Take 1 tablet (5 mg total) by mouth at bedtime. 90 tablet 0  . Fluticasone Furoate-Vilanterol (BREO ELLIPTA IN) Inhale into the lungs.    . gabapentin (NEURONTIN) 100 MG capsule Take 100 mg by mouth 3 (three) times daily.    . Insulin Lispro, Human, (HUMALOG KWIKPEN ) Inject 2.5 Units into the skin 3 (three) times daily.     [name] lamoTRIgine  (LAMICTAL) 200 MG tablet Take 200 mg by mouth 2 (two) times daily.     [name] levocetirizine (XYZAL) 5 MG tablet Take 5 mg by mouth every evening.    [name] lisinopril-hydrochlorothiazide (PRINZIDE,ZESTORETIC) 20-12.5 MG per tablet Take 1 tablet by mouth daily.     . meloxicam (MOBIC) 7.5 MG tablet Take 7.5 mg by mouth daily.    . metFORMIN (GLUCOPHAGE) 1000 MG tablet Take 1,000 mg by mouth 2 (two)  times daily with a meal.    . metoprolol succinate (TOPROL XL) 50 MG 24 hr tablet Take 1 tablet (50 mg total) by mouth daily. 30 tablet 6  . omeprazole (PRILOSEC) 20 MG capsule Take 20 mg by mouth daily.    . simvastatin (ZOCOR) 20 MG tablet Take 20 mg by mouth every evening.    . venlafaxine XR (EFFEXOR-XR) 150 MG 24 hr capsule Take total of 187.5 (150+37.5) mg daily 90 capsule 0  . venlafaxine XR (EFFEXOR-XR) 37.5 MG 24 hr capsule Take total of 187.5 mg daily (150 mg + 37.5 mg) 90 capsule 0   No current facility-administered medications for this visit.      Musculoskeletal: Strength & Muscle Tone: within normal limits Gait & Station: normal Patient leans: N/A  Psychiatric Specialty Exam: ROS  There were no vitals taken for this visit.There is no height or weight on file to calculate BMI.  General Appearance: Fairly Groomed  Eye Contact:  Good  Speech:  Clear and Coherent  Volume:  Normal  Mood:  {BHH MOOD:22306}  Affect:  {Affect (PAA):22687}  Thought Process:  Coherent and Goal Directed  Orientation:  Full (Time, Place, and Person)  Thought Content: Logical   Suicidal Thoughts:  {ST/HT (PAA):22692}  Homicidal Thoughts:  {ST/HT (PAA):22692}  Memory:  Immediate;   Good Recent;   Good Remote;   Good  Judgement:  {Judgement (PAA):22694}  Insight:  {Insight (PAA):22695}  Psychomotor Activity:  Normal  Concentration:  Concentration: Good and Attention Span: Good  Recall:  Good  Fund of Knowledge: Good  Language: Good  Akathisia:  No  Handed:  Right  AIMS (if indicated): not done  Assets:   Communication Skills Desire for Improvement  ADL's:  Intact  Cognition: WNL  Sleep:  {BHH GOOD/FAIR/POOR:22877}   MOCA 05/31/2016 8/30(+1 for clock. Unable to write down clock hands and placed numbers in wrong position, +1 for naming, +1 for attention, +5 for orientation)  Screenings:   Assessment and Plan:  Carol Richmond is a 67 y.o. year old female with anxiety, mild neurocognitive disorder, diabetes, hyperlipidemia,   headaches, non-epileptic episodes of decreased responsiveness followed by neurologist, recurrent left cholesteatoma , who presents for follow up appointment for No diagnosis found.  # Unspecified anxiety disorder # r/o GAD  There has been improvement in anxiety since uptitration of Effexor. Will continue current dose to target anxiety. Noted that there is some question of medication adherence (she provides inconsistent history); she is advised to bring her daughter at the next appointment. We may try taper off buspirone, gabapentin, lamotrigine, prescribed by PCP to avoid polypharmacy; will discuss with her and her daughter at the next visit.   # Mild neurocognitive disorder Exam is notable for illogical thought process at times and MOCA score is significantly low as above. ADL/IADL independent (except she does not drive) per collateral. However, there is a concern of medication adherence as above; will obtain collateral from her daughter at the next visit. Will continue aricept for dementia.  No known treatable cause of dementia on labs (except anemia), CT/MRI a few years ago (microvascular ischemic change.)   Plan 1. Continue Aricept 5 mg daily 2. Continue Effexor 187.5 mg daily (37.5 mg +150 mg) 3. Return to clinic in three months for 30 mins. Please come with your daughter if possible (Patient is on buspirone 15 mg BID, gabapentin 100 mg TID, lamotrigine 200 mg BID prescribed by her other provider) - she is on amitriptyline for headache (Patient  will ask her  PCP to be prescribed on amitriptyline for headache)    Norman Clay, MD 02/19/2017, 9:15 AM

## 2017-02-21 ENCOUNTER — Ambulatory Visit (HOSPITAL_COMMUNITY): Payer: PPO | Admitting: Psychiatry

## 2017-03-15 DIAGNOSIS — J449 Chronic obstructive pulmonary disease, unspecified: Secondary | ICD-10-CM | POA: Diagnosis not present

## 2017-03-15 DIAGNOSIS — Z96659 Presence of unspecified artificial knee joint: Secondary | ICD-10-CM | POA: Diagnosis not present

## 2017-03-15 DIAGNOSIS — I5020 Unspecified systolic (congestive) heart failure: Secondary | ICD-10-CM | POA: Diagnosis not present

## 2017-04-08 DIAGNOSIS — E119 Type 2 diabetes mellitus without complications: Secondary | ICD-10-CM | POA: Diagnosis not present

## 2017-04-08 DIAGNOSIS — H43821 Vitreomacular adhesion, right eye: Secondary | ICD-10-CM | POA: Diagnosis not present

## 2017-04-08 DIAGNOSIS — H35041 Retinal micro-aneurysms, unspecified, right eye: Secondary | ICD-10-CM | POA: Diagnosis not present

## 2017-04-08 DIAGNOSIS — H348111 Central retinal vein occlusion, right eye, with retinal neovascularization: Secondary | ICD-10-CM | POA: Diagnosis not present

## 2017-04-08 DIAGNOSIS — H3582 Retinal ischemia: Secondary | ICD-10-CM | POA: Diagnosis not present

## 2017-04-15 DIAGNOSIS — Z96659 Presence of unspecified artificial knee joint: Secondary | ICD-10-CM | POA: Diagnosis not present

## 2017-04-15 DIAGNOSIS — I5020 Unspecified systolic (congestive) heart failure: Secondary | ICD-10-CM | POA: Diagnosis not present

## 2017-04-15 DIAGNOSIS — J449 Chronic obstructive pulmonary disease, unspecified: Secondary | ICD-10-CM | POA: Diagnosis not present

## 2017-05-07 DIAGNOSIS — R5383 Other fatigue: Secondary | ICD-10-CM | POA: Diagnosis not present

## 2017-05-07 DIAGNOSIS — I10 Essential (primary) hypertension: Secondary | ICD-10-CM | POA: Diagnosis not present

## 2017-05-07 DIAGNOSIS — J4520 Mild intermittent asthma, uncomplicated: Secondary | ICD-10-CM | POA: Diagnosis not present

## 2017-05-07 DIAGNOSIS — J309 Allergic rhinitis, unspecified: Secondary | ICD-10-CM | POA: Diagnosis not present

## 2017-05-07 DIAGNOSIS — J40 Bronchitis, not specified as acute or chronic: Secondary | ICD-10-CM | POA: Diagnosis not present

## 2017-05-07 DIAGNOSIS — E1142 Type 2 diabetes mellitus with diabetic polyneuropathy: Secondary | ICD-10-CM | POA: Diagnosis not present

## 2017-05-07 DIAGNOSIS — Z6829 Body mass index (BMI) 29.0-29.9, adult: Secondary | ICD-10-CM | POA: Diagnosis not present

## 2017-05-07 DIAGNOSIS — Z6828 Body mass index (BMI) 28.0-28.9, adult: Secondary | ICD-10-CM | POA: Diagnosis not present

## 2017-05-07 DIAGNOSIS — J302 Other seasonal allergic rhinitis: Secondary | ICD-10-CM | POA: Diagnosis not present

## 2017-05-13 DIAGNOSIS — Z96659 Presence of unspecified artificial knee joint: Secondary | ICD-10-CM | POA: Diagnosis not present

## 2017-05-13 DIAGNOSIS — J449 Chronic obstructive pulmonary disease, unspecified: Secondary | ICD-10-CM | POA: Diagnosis not present

## 2017-05-13 DIAGNOSIS — I5020 Unspecified systolic (congestive) heart failure: Secondary | ICD-10-CM | POA: Diagnosis not present

## 2017-05-21 DIAGNOSIS — G459 Transient cerebral ischemic attack, unspecified: Secondary | ICD-10-CM | POA: Diagnosis not present

## 2017-05-21 DIAGNOSIS — Z6827 Body mass index (BMI) 27.0-27.9, adult: Secondary | ICD-10-CM | POA: Diagnosis not present

## 2017-05-22 ENCOUNTER — Other Ambulatory Visit: Payer: Self-pay | Admitting: Internal Medicine

## 2017-05-22 DIAGNOSIS — R5383 Other fatigue: Secondary | ICD-10-CM

## 2017-05-25 ENCOUNTER — Ambulatory Visit
Admission: RE | Admit: 2017-05-25 | Discharge: 2017-05-25 | Disposition: A | Discharge status: Home / Self Care | Payer: PPO | Source: Ambulatory Visit | Attending: Internal Medicine | Admitting: Internal Medicine

## 2017-05-25 DIAGNOSIS — R5383 Other fatigue: Secondary | ICD-10-CM

## 2017-05-26 DIAGNOSIS — Z90721 Acquired absence of ovaries, unilateral: Secondary | ICD-10-CM | POA: Diagnosis not present

## 2017-05-26 DIAGNOSIS — Z90710 Acquired absence of both cervix and uterus: Secondary | ICD-10-CM | POA: Diagnosis not present

## 2017-05-26 DIAGNOSIS — R102 Pelvic and perineal pain: Secondary | ICD-10-CM | POA: Diagnosis not present

## 2017-05-28 ENCOUNTER — Encounter (HOSPITAL_COMMUNITY): Payer: Self-pay | Admitting: Emergency Medicine

## 2017-05-28 ENCOUNTER — Emergency Department (HOSPITAL_COMMUNITY)
Admission: EM | Admit: 2017-05-28 | Discharge: 2017-05-28 | Disposition: A | Discharge status: Home / Self Care | Payer: PPO | Attending: Emergency Medicine | Admitting: Emergency Medicine

## 2017-05-28 ENCOUNTER — Emergency Department (HOSPITAL_COMMUNITY): Payer: PPO

## 2017-05-28 DIAGNOSIS — Z79899 Other long term (current) drug therapy: Secondary | ICD-10-CM | POA: Diagnosis not present

## 2017-05-28 DIAGNOSIS — R29810 Facial weakness: Secondary | ICD-10-CM | POA: Diagnosis not present

## 2017-05-28 DIAGNOSIS — R200 Anesthesia of skin: Secondary | ICD-10-CM | POA: Diagnosis present

## 2017-05-28 DIAGNOSIS — I10 Essential (primary) hypertension: Secondary | ICD-10-CM | POA: Insufficient documentation

## 2017-05-28 DIAGNOSIS — R4182 Altered mental status, unspecified: Secondary | ICD-10-CM | POA: Diagnosis not present

## 2017-05-28 DIAGNOSIS — R4781 Slurred speech: Secondary | ICD-10-CM | POA: Insufficient documentation

## 2017-05-28 DIAGNOSIS — Z96651 Presence of right artificial knee joint: Secondary | ICD-10-CM | POA: Diagnosis not present

## 2017-05-28 DIAGNOSIS — R569 Unspecified convulsions: Secondary | ICD-10-CM

## 2017-05-28 DIAGNOSIS — Z7982 Long term (current) use of aspirin: Secondary | ICD-10-CM | POA: Diagnosis not present

## 2017-05-28 DIAGNOSIS — G40209 Localization-related (focal) (partial) symptomatic epilepsy and epileptic syndromes with complex partial seizures, not intractable, without status epilepticus: Secondary | ICD-10-CM | POA: Diagnosis not present

## 2017-05-28 DIAGNOSIS — G40909 Epilepsy, unspecified, not intractable, without status epilepticus: Secondary | ICD-10-CM | POA: Diagnosis not present

## 2017-05-28 DIAGNOSIS — Z7984 Long term (current) use of oral hypoglycemic drugs: Secondary | ICD-10-CM | POA: Diagnosis not present

## 2017-05-28 DIAGNOSIS — E119 Type 2 diabetes mellitus without complications: Secondary | ICD-10-CM | POA: Insufficient documentation

## 2017-05-28 DIAGNOSIS — J45909 Unspecified asthma, uncomplicated: Secondary | ICD-10-CM | POA: Insufficient documentation

## 2017-05-28 LAB — DIFFERENTIAL
BASOS ABS: 0 10*3/uL (ref 0.0–0.1)
Basophils Relative: 0 %
EOS ABS: 0.1 10*3/uL (ref 0.0–0.7)
Eosinophils Relative: 1 %
LYMPHS PCT: 39 %
Lymphs Abs: 2.8 10*3/uL (ref 0.7–4.0)
MONO ABS: 1.1 10*3/uL — AB (ref 0.1–1.0)
Monocytes Relative: 15 %
NEUTROS ABS: 3.3 10*3/uL (ref 1.7–7.7)
NEUTROS PCT: 45 %

## 2017-05-28 LAB — PROTIME-INR
INR: 1.11
Prothrombin Time: 14.2 seconds (ref 11.4–15.2)

## 2017-05-28 LAB — CBC
HCT: 28.2 % — ABNORMAL LOW (ref 36.0–46.0)
HEMOGLOBIN: 8.3 g/dL — AB (ref 12.0–15.0)
MCH: 19.9 pg — ABNORMAL LOW (ref 26.0–34.0)
MCHC: 29.4 g/dL — ABNORMAL LOW (ref 30.0–36.0)
MCV: 67.5 fL — ABNORMAL LOW (ref 78.0–100.0)
Platelets: 221 10*3/uL (ref 150–400)
RBC: 4.18 MIL/uL (ref 3.87–5.11)
RDW: 19.2 % — ABNORMAL HIGH (ref 11.5–15.5)
WBC: 7.3 10*3/uL (ref 4.0–10.5)

## 2017-05-28 LAB — COMPREHENSIVE METABOLIC PANEL
ALBUMIN: 4 g/dL (ref 3.5–5.0)
ALK PHOS: 90 U/L (ref 38–126)
ALT: 15 U/L (ref 14–54)
ANION GAP: 13 (ref 5–15)
AST: 29 U/L (ref 15–41)
BILIRUBIN TOTAL: 0.5 mg/dL (ref 0.3–1.2)
BUN: 15 mg/dL (ref 6–20)
CALCIUM: 8.9 mg/dL (ref 8.9–10.3)
CO2: 24 mmol/L (ref 22–32)
Chloride: 101 mmol/L (ref 101–111)
Creatinine, Ser: 1.16 mg/dL — ABNORMAL HIGH (ref 0.44–1.00)
GFR calc Af Amer: 56 mL/min — ABNORMAL LOW (ref >60)
GFR, EST NON AFRICAN AMERICAN: 49 mL/min — AB (ref >60)
Glucose, Bld: 103 mg/dL — ABNORMAL HIGH (ref 65–99)
Potassium: 3.9 mmol/L (ref 3.5–5.1)
Sodium: 138 mmol/L (ref 135–145)
TOTAL PROTEIN: 7.3 g/dL (ref 6.5–8.1)

## 2017-05-28 LAB — I-STAT CHEM 8, ED
BUN: 17 mg/dL (ref 6–20)
CALCIUM ION: 1.12 mmol/L — AB (ref 1.15–1.40)
CREATININE: 1.1 mg/dL — AB (ref 0.44–1.00)
Chloride: 100 mmol/L — ABNORMAL LOW (ref 101–111)
Glucose, Bld: 100 mg/dL — ABNORMAL HIGH (ref 65–99)
HCT: 28 % — ABNORMAL LOW (ref 36.0–46.0)
Hemoglobin: 9.5 g/dL — ABNORMAL LOW (ref 12.0–15.0)
Potassium: 3.8 mmol/L (ref 3.5–5.1)
SODIUM: 139 mmol/L (ref 135–145)
TCO2: 26 mmol/L (ref 22–32)

## 2017-05-28 LAB — I-STAT TROPONIN, ED: TROPONIN I, POC: 0 ng/mL (ref 0.00–0.08)

## 2017-05-28 LAB — APTT: APTT: 29 s (ref 24–36)

## 2017-05-28 NOTE — ED Notes (Signed)
Per EDP request of Dr. Kathrynn Humble, paged the on-call physician at [facility] to him @ [phone].

## 2017-05-28 NOTE — ED Notes (Signed)
Patient transported to CT 

## 2017-05-28 NOTE — Discharge Instructions (Addendum)
You are seen in the ER after what appears to be seizure-like activity.  We spoke with your neurologist and she recommends that we increase your nighttime Lamictal dose to 1-1/2 tablets.  The outpatient neurologist will schedule an outpatient EEG study for you.  If you do not hear from the clinic by tomorrow afternoon please call them.  Please be very careful with any activity, and make sure there is a chair or readily available by places like the kitchen or the bathroom.  [place] state law prevents you from driving for 6 months after a seizure like activity.

## 2017-05-28 NOTE — ED Notes (Signed)
ED Provider at bedside. 

## 2017-05-28 NOTE — ED Triage Notes (Signed)
Pt to ER for evaluation of TIA symptoms intermittently x2 weeks, patient family reports left side is affected when they occur with facial droop and slurred speech. Reports has had "6 episodes today." pt a/o x4 at this time, no neuro deficits noted. Family reports significant hx of seizures as well.

## 2017-05-29 LAB — LAMOTRIGINE LEVEL: LAMOTRIGINE LVL: 11.7 ug/mL (ref 2.0–20.0)

## 2017-05-30 ENCOUNTER — Other Ambulatory Visit: Payer: Self-pay

## 2017-05-30 ENCOUNTER — Encounter: Payer: Self-pay | Admitting: *Deleted

## 2017-05-30 ENCOUNTER — Other Ambulatory Visit: Payer: Self-pay | Admitting: *Deleted

## 2017-05-30 ENCOUNTER — Encounter: Payer: Self-pay | Admitting: Neurology

## 2017-05-30 ENCOUNTER — Ambulatory Visit: Payer: PPO | Admitting: Neurology

## 2017-05-30 VITALS — BP 118/60 | HR 96 | Ht 64.0 in | Wt 165.0 lb

## 2017-05-30 DIAGNOSIS — G252 Other specified forms of tremor: Secondary | ICD-10-CM

## 2017-05-30 DIAGNOSIS — R531 Weakness: Secondary | ICD-10-CM

## 2017-05-30 MED ORDER — MEDICAL COMPRESSION STOCKINGS MISC: 2.0000 | Freq: Every day | 0 refills | Status: DC

## 2017-05-30 MED ORDER — ABDOMINAL BINDER/ELASTIC MED MISC: 0 refills | Status: DC

## 2017-05-30 NOTE — Patient Outreach (Signed)
[place]) Care Management  05/30/2017  KADEE PHILYAW 1950-05-06 [id number]  Telephone screening call  Referral received 05/30/17 Referral source: [facility] utilization management  Referral request  reason :  Referral to LCSW , Husband concern with stability and would like a wheelchair and shower seat assistance for patient .  PCP : Dr. Sherrie Sport Neurologist : Dr. Ellouise Newer  Outreach call to primary contact listed on referral form  Skyeler Scalese , spouse of patient,no answer voice mail not set up.  Placed call to patient contact number, man answered identified self as Lynett Grimes, HIPAA verified and explained reason for the call. Mr.Bartz placed patient on the phone HIPAA verified reason for the call. Patient states her husband just worries about her.   Conditions  Patient discussed her current conditions. Patient discussed episodes of legs, jerky and starts to give way when walking, reports happening when she gets up or changes positions from sitting to first standing .  Patient shared recent ED visit related to symptoms and they thought it might be a seizure but she didn't think so as it was not like seizure she had in past.  Patient had visit with her neurologist on today and medication adjustments made. Patient reports doctor  has stopped blood pressure medications. Patient reports blood pressure readings where low at the office when they changed her position from sitting to standing.  Patient reports she is to take her blood pressure everyday keep a record  and report it to neurologist. Patient states she has new prescription for compression stockings that her daughter will pick up, explained to patient how compression hose may  help with circulation and benefit low blood pressure when standing.  Patient discussed other medical condition of Diabetes, states it is under control , she normally checks it every day but not in the last 3 days, is runs less than 150  and she is on pill for it and when checked at office every 3 months reading is good, ( A1c 6.0 on 05/07/17). Patient discussed history of migraines,  that are under control now.   Social Patient lives at home with her husband , he recently had hip surgery. Patient having supportive daughters that visit and assist as needed at home. Patient reports being independent with ADL's at home usually standing during taking a shower.Patient reports using a cane at times at home and has a walker that she may begin using. Patient reports her husband feels it would be best if she had a shower chair for use at home and it would be better if she had a wheelchair to use when out.  Patient daughter is able to provide transportation to MD visits until husband is able to return to driving.   Falls  Patient denies having a fall in the last 3 months, states she usually catches herself before falling, while on phone patient asked her husband also if she had fall in last 3 months or with episodes and he said no.   Medication  Patient denies cost concern with medication, she fills a weekly pill organizer and her daughter helps her and will Radio broadcast assistant today with medication changes.   Consent Explained and offered [facility] care management services team, explained community and telephonic programs as no cost benefit of insurance plan,  patient declines need for nursing follow up for telephonic education and support of chronic disease of Hypertension or diabetes or home visit follow up for further assessment for safety evaluation related to  recent episodes. Patient is agreeable to assistance with coordination of being able to get shower chair and wheelchair.   Plan  Will place consult to LCSW per referral for  equipment needs of shower chair and wheelchair.  Provided patient with [facility] telephone  contact information  Will send successful outreach letter to patient .   Joylene Draft, RN, [place]  Management Coordinator  [phone]- Mobile [phone]- Toll Free Main Office

## 2017-05-30 NOTE — Patient Instructions (Signed)
1. Schedule CTA head and neck with and without contrast  2. Stop the Zestoretic and Toprol XL 3. Start checking BP regularly every morning at the same time 4. Start using either elastic compression stockings or an abdominal binder. Every time you would go from sitting to standing, wait 5 minutes before you start moving 4. Continue all other medications for now

## 2017-05-30 NOTE — Progress Notes (Signed)
NEUROLOGY FOLLOW UP OFFICE NOTE  JOLIYAH LIPPENS [id number]  DOB: 1951/01/09  HISTORY OF PRESENT ILLNESS: I had the pleasure of seeing Alichia Alridge in follow-up in the neurology clinic on 05/30/2017.  The patient was last seen 6 months ago. She is again accompanied by her husband and daughter who help supplement the history today. I had previously been seeing her for headaches. She was initially having them on a daily basis, she has had good response to amitriptyline and reports headaches are not occurring daily and controlled on medication. On her last visit, she was reporting intermittent left leg weakness where her left knee "just gives way." There is no back or knee pain. MRI lumbar spine showed mild lumbar degenerative change, no disc protrusion or nerve impingement seen. EMG/NCV of the left leg was normal, no neuropathy or radiculopathy seen. Repeat EEG 12/04/16 showed mild background slowing, no epileptiform discharges. She was initially seen in 2015 for episodes of loss of consciousness that were non-epileptic, these are not occurring anymore, husband denies any unresponsiveness when she has the leg symptoms.   Since her last visit, she has been to the ER last 05/28/17 for recurrent episodes of shaking, that day she was noted to have left facial droop and slurred speech. Her daughter reports that for the past 2 weeks, she would have episodes where she legs would start shaking. They would last 2-5 minutes, she would stop and stand still for a few minutes then symptoms resolve. They always occur while up walking, never when sitting or supine. Her daughter has noticed them mostly occurring when she has been sitting in the car then gets out. The worst episode was 2 weeks ago Sunday, she went to open the door then started having the leg shaking and thought she was going down the banister. She went in the house and started sliding down, family got her to a chair and noted her mouth was drawn to  the left. Another time her daughter noticed both hands were clenched. She would have multiple events in a day sometimes, so far today she has had 3. She had one while getting on to the scale in the office today, her daughter could tell she was having them, she started to have shaking on the legs and arms, L>R, leaned to her daughter on the right, lasting less than a minute. No post-event confusion. She denies any dizziness. Her daughter reports her BP is low most of the time, SBP usually under 100.   Diagnostic Data: I personally reviewed MRI brain without contrast done 05/25/17 which did not show any acute changes, there was mild to moderate chronic microvascular disease, R>L.  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 Records from her neurologist from 2009 to 2013 were reviewed. She was having episodes of abnormal smell like inhaling anesthesia, followed by breathing problems, numbness, disorientation, memory loss, difficulty finding words, and mild staggering gait. She was started on Keppra, then switched to Topamax. A 48-hour EEG in 2010 captured 2 typical spells with no associated EEG changes. Baseline EEG was normal. She started seeing a psychologist and psychiatrist, Topamax was tapered and she was started on Lamictal for mood stabilization. She was told by the psychologist that she has "absent seizures" and that "the left side of her brain is getting worse, and this could be affected by her sleep apnea." She apparently had been seen by  a neurologist at [facility] who felt the episodes of unresponsiveness could be due to sleep attacks. She has a diagnosis of obstructive sleep apnea. It appears the spells resolved in 2011.   Since November 2014 or so, she has had episodes of feeling off balance with speech difficulties where speech is slurred and she is unable to find appropriate words. They were  standing at [facility] then she started rocking, could not keep her balance, then felt her speech was going. They were at a birthday party on 05/09/13 when it looked like she was waking up, then started "acting like a drunk person." She has been having episodes of blanking out "like she is out in space," occurring 2-3 times a week. She states these are different from the spells she had in 2009. Her husband reports that each time is worse than the other. She has been having headaches on a near daily basis. Headaches are over the vertex or bilateral temporal regions. She hears a "boom" inside her head and wants to pull her hair out. There is no associated nausea/vomiting/photo or phonophobia. She asked if these episodes can be caused by stress, reporting that she has been having more episodes since she has been taking care of her sister the past 3 months. She would "cry over nothing." She has been taking Lexapro for many years and feels that "it does not seem to be working." She takes Ativan 11m twice a day for anxiety.   Diagnostic Data: I personally reviewed MRI brain without contrast which did not show any acute changes, there was moderate chronic white matter microvascular disease, hippocampi symmetric with no abnormal signal seen. Her 24-hour EEG was mildly abnormal due to mild diffuse slowing of the background, no epileptiform discharges or electrographic seizures seen. She did not complete diary, but tells me today that she did have an episode where she felt she had a seizure and looked like she fell asleep.   PAST MEDICAL HISTORY: Past Medical History:  Diagnosis Date  . Asthma   . Diabetes mellitus without complication ([place]   . Diarrhea   . Diverticulosis of colon (without mention of hemorrhage)   . Functional ovarian cysts   . GERD (gastroesophageal reflux disease)   . Hemorrhoids, external, without mention of complication   . Hormone replacement therapy (postmenopausal)   . Hypertension   .  IBS (irritable bowel syndrome)   . Migraine   . Right knee DJD   . [place]izures [place]   . Shortness of breath   . Sleep apnea    no cpap    study 2 yrs ago    MEDICATIONS:  Outpatient Encounter Medications as of 05/30/2017  Medication Sig  . acyclovir (ZOVIRAX) 800 MG tablet Take 800 mg by mouth daily.   .Marland Kitchenalbuterol (PROVENTIL HFA;VENTOLIN HFA) 108 (90 BASE) MCG/ACT inhaler Inhale 2 puffs into the lungs every 6 (six) hours as needed for wheezing or shortness of breath.  .Marland Kitchenamitriptyline (ELAVIL) 50 MG tablet Take 1 tablet (50 mg total) by mouth at bedtime.  .Marland Kitchenaspirin EC 81 MG tablet Take by mouth.  . busPIRone (BUSPAR) 15 MG tablet Take 2 tablets by mouth twice daily  . cholecalciferol (VITAMIN D) 1000 UNITS tablet Take 1,000 Units by mouth daily.  .Marland Kitchendonepezil (ARICEPT) 5 MG tablet Take 1 tablet (5 mg total) by mouth at bedtime.  . Fluticasone Furoate-Vilanterol (BREO ELLIPTA IN) Inhale into the lungs.  . furosemide (LASIX) 10 MG/ML injection 20 mg.  . gabapentin (NEURONTIN)  100 MG capsule Take 100 mg by mouth 3 (three) times daily.  [name] lamoTRIgine (LAMICTAL) 200 MG tablet Take 200 mg by mouth 2 (two) times daily.   [name] levocetirizine (XYZAL) 5 MG tablet Take 5 mg by mouth every evening.  [name] lisinopril-hydrochlorothiazide (PRINZIDE,ZESTORETIC) 20-12.5 MG per tablet Take 1 tablet by mouth daily.   . meloxicam (MOBIC) 7.5 MG tablet Take 7.5 mg by mouth daily.  . metFORMIN (GLUCOPHAGE) 1000 MG tablet Take 1,000 mg by mouth 2 (two) times daily with a meal.  . metoprolol succinate (TOPROL XL) 50 MG 24 hr tablet Take 1 tablet (50 mg total) by mouth daily.  [name] omeprazole (PRILOSEC) 20 MG capsule Take 20 mg by mouth daily.  . OXYGEN Inhale 2 L into the lungs.  . simvastatin (ZOCOR) 20 MG tablet Take 20 mg by mouth every evening.  . venlafaxine XR (EFFEXOR-XR) 150 MG 24 hr capsule Take total of 187.5 (150+37.5) mg daily  . venlafaxine XR (EFFEXOR-XR) 37.5 MG 24 hr capsule Take total of 187.5 mg daily  (150 mg + 37.5 mg)   No facility-administered encounter medications on file as of 05/30/2017.      ALLERGIES: No Known Allergies  FAMILY HISTORY: Family History  Problem Relation Age of Onset  . Heart disease Mother   . Diabetes Mother   . Heart disease Brother   . Cancer Sister   . Heart disease Brother   . Heart disease Brother     SOCIAL HISTORY: Social History   Socioeconomic History  . Marital status: Married    Spouse name: Not on file  . Number of children: Not on file  . Years of education: Not on file  . Highest education level: Not on file  Occupational History  . Not on file  Social Needs  . Financial resource strain: Not on file  . Food insecurity:    Worry: Not on file    Inability: Not on file  . Transportation needs:    Medical: Not on file    Non-medical: Not on file  Tobacco Use  . Smoking status: Never Smoker  . Smokeless tobacco: Never Used  Substance and Sexual Activity  . Alcohol use: No    Alcohol/week: 0.0 oz  . Drug use: No  . Sexual activity: Not on file  Lifestyle  . Physical activity:    Days per week: Not on file    Minutes per session: Not on file  . Stress: Not on file  Relationships  . Social connections:    Talks on phone: Not on file    Gets together: Not on file    Attends religious service: Not on file    Active member of club or organization: Not on file    Attends meetings of clubs or organizations: Not on file    Relationship status: Not on file  . Intimate partner violence:    Fear of current or ex partner: Not on file    Emotionally abused: Not on file    Physically abused: Not on file    Forced sexual activity: Not on file  Other Topics Concern  . Not on file  Social History Narrative  . Not on file    REVIEW OF SYSTEMS: Constitutional: No fevers, chills, or sweats, no generalized fatigue, change in appetite Eyes: No visual changes, double vision, eye pain Ear, nose and throat: No hearing loss, ear pain,  nasal congestion, sore throat Cardiovascular: No chest pain, palpitations Respiratory:  No shortness of  breath at rest or with exertion, wheezes GastrointestinaI: No nausea, vomiting, diarrhea, abdominal pain, fecal incontinence Genitourinary:  No dysuria, urinary retention or frequency Musculoskeletal:  No neck pain, back pain Integumentary: No rash, pruritus, skin lesions Neurological: as above Psychiatric: + depression, no insomnia, + anxiety Endocrine: No palpitations, fatigue, diaphoresis, mood swings, change in appetite, change in weight, increased thirst Hematologic/Lymphatic:  No anemia, purpura, petechiae. Allergic/Immunologic: no itchy/runny eyes, nasal congestion, recent allergic reactions, rashes  PHYSICAL EXAM: Vitals:   05/30/17 0810  BP: 118/60  Pulse: 96  SpO2: 98%   Orthostatic vital signs: Supine: BP 126/58 HR 96 Sitting: BP 94/50 HR 95 Standing: BP 82/40 HR 97  General: No acute distress Head:  Normocephalic/atraumatic Neck: supple, no paraspinal tenderness, full range of motion Heart:  Regular rate and rhythm Lungs:  Clear to auscultation bilaterally Back: No paraspinal tenderness Skin/Extremities: No rash, no edema Neurological Exam: alert and oriented to person, place, and time. No aphasia or dysarthria. Fund of knowledge is appropriate.  Recent and remote memory are intact.Attention and concentration are normal.    Able to name objects and repeat phrases. Cranial nerves: Pupils equal, round, reactive to light.  Extraocular movements intact with no nystagmus. Visual fields full. Facial sensation intact. No facial asymmetry. Tongue, uvula, palate midline.  Motor: Bulk and tone normal, muscle strength 5/5 throughout with no pronator drift.  Sensation to light touch, pin, vibration intact.  No extinction to double simultaneous stimulation.  Deep tendon reflexes: +2 on both UE, unable to elicit right patella reflex, +2 on left patella, both ankles, toes downgoing.   Finger to nose testing intact.  Gait slow and cautious, no ataxia.Romberg negative.  IMPRESSION: This is a 67 yo RH woman with a history of diabetes, hyperlipidemia, headaches, and history of non-epileptic episodes of decreased responsiveness, who initially presented with episodes of feeling off balance with slurred speech. MRI brain and 24-hour EEG unremarkable. No further episodes of decreased responsiveness. Headaches are fairly controlled on amitriptyline 73m qhs. She presents with new symptoms of leg shaking when standing, mostly affecting the left side. At one point, the left side of her face was drawn. MRI brain no acute changes. Symptoms only occur when standing. She is noted to have a 40+ SBP drop from supine to standing, BP 82/40 standing. Symptoms concerning for orthostatic tremor, less likely seizure. The left-sided deficits may relate to transient hypoperfusion in the right hemisphere, CTA head and neck will be ordered to assess for stenosis. She was instructed to stop Zestoretic and Toprol XL and follow-up with her PCP, she will continue on Lasix and discuss this with PCP as well. Family was advised to check BP everyday once she stops BP medication. She was advised to either use an abdominal binder or elastic compression stockings daily. Follow-up in 1 month, then know to call for any changes.   Thank you for allowing me to participate in her care.  Please do not hesitate to call for any questions or concerns.  The duration of this appointment visit was 30 minutes of face-to-face time with the patient.  Greater than 50% of this time was spent in counseling, explanation of diagnosis, planning of further management, and coordination of care.   KEllouise Newer M.D.   CC: Dr. HLouanne Belton([phone]

## 2017-06-02 DIAGNOSIS — Z86010 Personal history of colonic polyps: Secondary | ICD-10-CM | POA: Diagnosis not present

## 2017-06-02 DIAGNOSIS — Z1211 Encounter for screening for malignant neoplasm of colon: Secondary | ICD-10-CM | POA: Diagnosis not present

## 2017-06-02 DIAGNOSIS — K589 Irritable bowel syndrome without diarrhea: Secondary | ICD-10-CM | POA: Diagnosis not present

## 2017-06-02 DIAGNOSIS — Z91040 Latex allergy status: Secondary | ICD-10-CM | POA: Diagnosis not present

## 2017-06-02 DIAGNOSIS — Z09 Encounter for follow-up examination after completed treatment for conditions other than malignant neoplasm: Secondary | ICD-10-CM | POA: Diagnosis not present

## 2017-06-02 DIAGNOSIS — G4730 Sleep apnea, unspecified: Secondary | ICD-10-CM | POA: Diagnosis not present

## 2017-06-02 DIAGNOSIS — E669 Obesity, unspecified: Secondary | ICD-10-CM | POA: Diagnosis not present

## 2017-06-02 DIAGNOSIS — Z91041 Radiographic dye allergy status: Secondary | ICD-10-CM | POA: Diagnosis not present

## 2017-06-02 DIAGNOSIS — F418 Other specified anxiety disorders: Secondary | ICD-10-CM | POA: Diagnosis not present

## 2017-06-02 DIAGNOSIS — E119 Type 2 diabetes mellitus without complications: Secondary | ICD-10-CM | POA: Diagnosis not present

## 2017-06-02 DIAGNOSIS — I110 Hypertensive heart disease with heart failure: Secondary | ICD-10-CM | POA: Diagnosis not present

## 2017-06-02 DIAGNOSIS — J45909 Unspecified asthma, uncomplicated: Secondary | ICD-10-CM | POA: Diagnosis not present

## 2017-06-02 DIAGNOSIS — Z6827 Body mass index (BMI) 27.0-27.9, adult: Secondary | ICD-10-CM | POA: Diagnosis not present

## 2017-06-02 DIAGNOSIS — K219 Gastro-esophageal reflux disease without esophagitis: Secondary | ICD-10-CM | POA: Diagnosis not present

## 2017-06-02 DIAGNOSIS — Z90710 Acquired absence of both cervix and uterus: Secondary | ICD-10-CM | POA: Diagnosis not present

## 2017-06-02 DIAGNOSIS — Z9049 Acquired absence of other specified parts of digestive tract: Secondary | ICD-10-CM | POA: Diagnosis not present

## 2017-06-02 DIAGNOSIS — J449 Chronic obstructive pulmonary disease, unspecified: Secondary | ICD-10-CM | POA: Diagnosis not present

## 2017-06-02 DIAGNOSIS — Z91013 Allergy to seafood: Secondary | ICD-10-CM | POA: Diagnosis not present

## 2017-06-02 DIAGNOSIS — Z79899 Other long term (current) drug therapy: Secondary | ICD-10-CM | POA: Diagnosis not present

## 2017-06-02 DIAGNOSIS — Z7984 Long term (current) use of oral hypoglycemic drugs: Secondary | ICD-10-CM | POA: Diagnosis not present

## 2017-06-02 DIAGNOSIS — I509 Heart failure, unspecified: Secondary | ICD-10-CM | POA: Diagnosis not present

## 2017-06-02 DIAGNOSIS — E7800 Pure hypercholesterolemia, unspecified: Secondary | ICD-10-CM | POA: Diagnosis not present

## 2017-06-02 DIAGNOSIS — F319 Bipolar disorder, unspecified: Secondary | ICD-10-CM | POA: Diagnosis not present

## 2017-06-02 DIAGNOSIS — Z7982 Long term (current) use of aspirin: Secondary | ICD-10-CM | POA: Diagnosis not present

## 2017-06-03 ENCOUNTER — Encounter: Payer: Self-pay | Admitting: Neurology

## 2017-06-04 DIAGNOSIS — R29898 Other symptoms and signs involving the musculoskeletal system: Secondary | ICD-10-CM | POA: Diagnosis not present

## 2017-06-04 DIAGNOSIS — R609 Edema, unspecified: Secondary | ICD-10-CM | POA: Diagnosis not present

## 2017-06-04 NOTE — ED Provider Notes (Signed)
[facility] EMERGENCY DEPARTMENT Provider Note   CSN: [id number] Arrival date & time: 05/28/17  1019     History   Chief Complaint Chief Complaint  Patient presents with  . Transient Ischemic Attack    HPI Carol Richmond is a 67 y.o. female.  HPI  67 y.o with hx of DM, seizures comes in with cc of numbness and shaking. Pt reports that for the last 2 weeks she has had intermittent episodes of L sided shaking with associated facial droop and slurred speech. Pt is aware of these incidences, and reports that the frequency, has increased. Family reports that there were 6 episodes today, each lasting < 5 min. Family reports that pt is confused after the event and needs a short while to get oriented.  Pt's normal seizures are absent seizures  -no tremors are involved. PT denies any syncope, new numbness, tingling, vision change. Pt has no hx of stroke. No new meds.   Past Medical History:  Diagnosis Date  . Asthma   . Diabetes mellitus without complication ([place])   . Diarrhea   . Diverticulosis of colon (without mention of hemorrhage)   . Functional ovarian cysts   . GERD (gastroesophageal reflux disease)   . Hemorrhoids, external, without mention of complication   . Hormone replacement therapy (postmenopausal)   . Hypertension   . IBS (irritable bowel syndrome)   . Migraine   . Right knee DJD   . Seizures ([place])   . Shortness of breath   . Sleep apnea    no cpap    study 2 yrs ago    Patient Active Problem List   Diagnosis Date Noted  . Transient left leg weakness 08/16/2016  . Anxiety state 07/01/2016  . Mild neurocognitive disorder 05/02/2016  . Migraine without aura and without status migrainosus, not intractable 01/27/2015  . Depression 07/25/2014  . Chronic daily headache 12/03/2013  . Transient alteration of awareness 05/25/2013  . Osteoarthritis of right knee 09/03/2012  . Right knee DJD   . IBS (irritable bowel syndrome)   .  Diverticulosis of colon (without mention of hemorrhage)   . GERD (gastroesophageal reflux disease)   . Hypertension   . Hormone replacement therapy (postmenopausal)   . Migraine   . Seizures ([place])   . Hemorrhoids, external, without mention of complication   . Hemorrhoids, external, without mention of complication   . SHOULDER PAIN 08/24/2008  . IMPINGEMENT SYNDROME 08/24/2008    Past Surgical History:  Procedure Laterality Date  . CHOLECYSTECTOMY  1980  . LEFT HEART CATHETERIZATION WITH CORONARY ANGIOGRAM N/A 11/18/2013   Procedure: LEFT HEART CATHETERIZATION WITH CORONARY ANGIOGRAM;  Surgeon: Burnell Blanks, MD;  Location: [facility] CATH LAB;  Service: Cardiovascular;  Laterality: N/A;  . OOPHORECTOMY     2012 Dr. Adah Perl in [place]  . TONSILLECTOMY    . TOTAL KNEE ARTHROPLASTY Right 09/02/2012   Procedure: TOTAL KNEE ARTHROPLASTY- right;  Surgeon: Ninetta Lights, MD;  Location: [facility];  Service: Orthopedics;  Laterality: Right;  . TUBAL LIGATION    . TYMPANOMASTOIDECTOMY Left 04/02/2016   Procedure: LEFT CANAL WALL DOWN TYMPANOMASTOIDECTOMY;  Surgeon: Leta Baptist, MD;  Location: [facility];  Service: ENT;  Laterality: Left;  [name] VAGINAL HYSTERECTOMY  1990     OB History   None      Home Medications    Prior to Admission medications   Medication Sig Start Date End Date Taking? Authorizing Provider  acyclovir (ZOVIRAX) 800  MG tablet Take 800 mg by mouth daily.    Yes [provider]  albuterol (PROVENTIL HFA;VENTOLIN HFA) 108 (90 BASE) MCG/ACT inhaler Inhale 2 puffs into the lungs every 6 (six) hours as needed for wheezing or shortness of breath. 10/11/14  Yes Herminio Commons, MD  amitriptyline (ELAVIL) 50 MG tablet Take 1 tablet (50 mg total) by mouth at bedtime. 12/02/16  Yes Cameron Sprang, MD  busPIRone (BUSPAR) 15 MG tablet Take 2 tablets by mouth twice daily   Yes [provider]  cholecalciferol (VITAMIN D) 1000 UNITS tablet Take 1,000  Units by mouth daily.   Yes [provider]  donepezil (ARICEPT) 5 MG tablet Take 1 tablet (5 mg total) by mouth at bedtime. 11/21/16  Yes Hisada, Elie Goody, MD  Fluticasone Furoate-Vilanterol (BREO ELLIPTA IN) Inhale into the lungs.   Yes [provider]  gabapentin (NEURONTIN) 100 MG capsule Take 100 mg by mouth 3 (three) times daily.   Yes [provider]  lamoTRIgine (LAMICTAL) 200 MG tablet Take 200 mg by mouth 2 (two) times daily.    Yes [provider]  levocetirizine (XYZAL) 5 MG tablet Take 5 mg by mouth every evening.   Yes [provider]  lisinopril-hydrochlorothiazide (PRINZIDE,ZESTORETIC) 20-12.5 MG per tablet Take 1 tablet by mouth daily.    Yes [provider]  meloxicam (MOBIC) 7.5 MG tablet Take 7.5 mg by mouth daily.   Yes [provider]  metFORMIN (GLUCOPHAGE) 1000 MG tablet Take 1,000 mg by mouth 2 (two) times daily with a meal.   Yes [provider]  metoprolol succinate (TOPROL XL) 50 MG 24 hr tablet Take 1 tablet (50 mg total) by mouth daily. 12/07/13  Yes Herminio Commons, MD  omeprazole (PRILOSEC) 20 MG capsule Take 20 mg by mouth daily.   Yes [provider]  OXYGEN Inhale 2 L into the lungs.   Yes [provider]  simvastatin (ZOCOR) 20 MG tablet Take 20 mg by mouth every evening.   Yes [provider]  venlafaxine XR (EFFEXOR-XR) 150 MG 24 hr capsule Take total of 187.5 (150+37.5) mg daily 11/21/16  Yes Hisada, Elie Goody, MD  venlafaxine XR (EFFEXOR-XR) 37.5 MG 24 hr capsule Take total of 187.5 mg daily (150 mg + 37.5 mg) 11/21/16  Yes Hisada, Elie Goody, MD  aspirin EC 81 MG tablet Take by mouth.    [provider]  Elastic Bandages & Supports (ABDOMINAL BINDER/ELASTIC MED) MISC Apply binder each day 05/30/17   Cameron Sprang, MD  Elastic Bandages & Supports ([place]) [place] 2 Devices by Does not apply route daily. 05/30/17   Cameron Sprang, MD    furosemide (LASIX) 10 MG/ML injection 20 mg.    [provider]    Family History Family History  Problem Relation Age of Onset  . Heart disease Mother   . Diabetes Mother   . Heart disease Brother   . Cancer Sister   . Heart disease Brother   . Heart disease Brother     Social History Social History   Tobacco Use  . Smoking status: Never Smoker  . Smokeless tobacco: Never Used  Substance Use Topics  . Alcohol use: No    Alcohol/week: 0.0 oz  . Drug use: No     Allergies   Patient has no known allergies.   Review of Systems Review of Systems  Constitutional: Positive for activity change.  Eyes: Negative for visual disturbance.  Respiratory: Negative for  chest tightness.   Cardiovascular: Negative for chest pain.  Gastrointestinal: Negative for nausea.  Neurological: Positive for seizures, facial asymmetry and speech difficulty. Negative for syncope.  All other systems reviewed and are negative.    Physical Exam Updated Vital Signs BP 110/67   Pulse 89   Temp (!) 97.3 F (36.3 C) (Oral)   Resp 19   SpO2 99%   Physical Exam  Constitutional: She is oriented to person, place, and time. She appears well-developed.  HENT:  Head: Normocephalic and atraumatic.  Eyes: EOM are normal.  Neck: Normal range of motion. Neck supple.  Cardiovascular: Normal rate.  Pulmonary/Chest: Effort normal.  Abdominal: Bowel sounds are normal.  Neurological: She is alert and oriented to person, place, and time. No cranial nerve deficit. Coordination normal.  Cerebellar exam is normal (finger to nose) Sensory exam normal for bilateral upper and lower extremities - and patient is able to discriminate between sharp and dull. Motor exam is 4+/5   Skin: Skin is warm and dry.  Nursing note and vitals reviewed.    ED Treatments / Results  Labs (all labs ordered are listed, but only abnormal results are displayed) Labs Reviewed  CBC - Abnormal; Notable for the  following components:      Result Value   Hemoglobin 8.3 (*)    HCT 28.2 (*)    MCV 67.5 (*)    MCH 19.9 (*)    MCHC 29.4 (*)    RDW 19.2 (*)    All other components within normal limits  DIFFERENTIAL - Abnormal; Notable for the following components:   Monocytes Absolute 1.1 (*)    All other components within normal limits  COMPREHENSIVE METABOLIC PANEL - Abnormal; Notable for the following components:   Glucose, Bld 103 (*)    Creatinine, Ser 1.16 (*)    GFR calc non Af Amer 49 (*)    GFR calc Af Amer 56 (*)    All other components within normal limits  I-STAT CHEM 8, ED - Abnormal; Notable for the following components:   Chloride 100 (*)    Creatinine, Ser 1.10 (*)    Glucose, Bld 100 (*)    Calcium, Ion 1.12 (*)    Hemoglobin 9.5 (*)    HCT 28.0 (*)    All other components within normal limits  PROTIME-INR  APTT  LAMOTRIGINE LEVEL  I-STAT TROPONIN, ED  CBG MONITORING, ED    EKG EKG Interpretation  Date/Time:  Wednesday May 28 2017 10:29:13 EDT Ventricular Rate:  97 PR Interval:  172 QRS Duration: 152 QT Interval:  424 QTC Calculation: 538 R Axis:   -23 Text Interpretation:  Normal sinus rhythm Left bundle branch block Abnormal ECG No acute changes Nonspecific ST and T wave abnormality No significant change since last tracing Confirmed by Varney Biles [phone]) on 05/28/2017 12:33:08 PM   Radiology No results found.  Procedures Procedures (including critical care time)  Medications Ordered in ED Medications - No data to display   Initial Impression / Assessment and Plan / ED Course  I have reviewed the triage vital signs and the nursing notes.  Pertinent labs & imaging results that were available during my care of the patient were reviewed by me and considered in my medical decision making (see chart for details).     Pt comes in with short burst of focal neurologic symptoms. DDX: TIA, partial seizures.  Concerns are high for a likely new partial   Seizure. I spoke with dr. Delice Lesch,  pt's neurologist. She has asked me to have pt increase lamictal at night time - and she will order an outpatient eeg and outpatient f.u.  The patient is reassured that these symptoms do not appear to represent a stroke/tia, but I have advised her to seek ED immediatly if the symptoms last longer.  Final Clinical Impressions(s) / ED Diagnoses   Final diagnoses:  Partial seizure [facility])    ED Discharge Orders    None       Varney Biles, MD 06/04/17 1012

## 2017-06-06 ENCOUNTER — Other Ambulatory Visit: Payer: Self-pay

## 2017-06-06 NOTE — Patient Outreach (Addendum)
[place] [facility]) Care Management  06/06/2017  Carol Richmond 10-Oct-1950 [id number]   SW talked with patient regarding social work referral received on 06/02/17.  Patient reported that she is in need of a shower chair as well as a wheelchair.  SW provided resource of [employer] and [employer] for shower chair.  SW encouraged patient to call both places in order to compare cost.  Patient confirmed that her husband will be able to drive to pick up the chair.   SW encouraged patient to schedule an appointment with her primary care provider regarding a prescription for a wheelchair.  Patient reported that her husband has an appointment with the same provider next week so she will talk with him at that time.    SW will follow up with patient in two weeks about status of shower chair and wheelchair.  Ronn Melena, BSW Social Worker [phone]

## 2017-06-09 ENCOUNTER — Ambulatory Visit (INDEPENDENT_AMBULATORY_CARE_PROVIDER_SITE_OTHER): Payer: PPO | Admitting: Otolaryngology

## 2017-06-09 DIAGNOSIS — H95122 Granulation of postmastoidectomy cavity, left ear: Secondary | ICD-10-CM | POA: Diagnosis not present

## 2017-06-12 DIAGNOSIS — Z6829 Body mass index (BMI) 29.0-29.9, adult: Secondary | ICD-10-CM | POA: Diagnosis not present

## 2017-06-12 DIAGNOSIS — J4541 Moderate persistent asthma with (acute) exacerbation: Secondary | ICD-10-CM | POA: Diagnosis not present

## 2017-06-13 DIAGNOSIS — J449 Chronic obstructive pulmonary disease, unspecified: Secondary | ICD-10-CM | POA: Diagnosis not present

## 2017-06-13 DIAGNOSIS — Z96659 Presence of unspecified artificial knee joint: Secondary | ICD-10-CM | POA: Diagnosis not present

## 2017-06-13 DIAGNOSIS — I5020 Unspecified systolic (congestive) heart failure: Secondary | ICD-10-CM | POA: Diagnosis not present

## 2017-06-16 ENCOUNTER — Ambulatory Visit
Admission: RE | Admit: 2017-06-16 | Discharge: 2017-06-16 | Disposition: A | Discharge status: Home / Self Care | Payer: PPO | Source: Ambulatory Visit | Attending: Neurology | Admitting: Neurology

## 2017-06-16 ENCOUNTER — Other Ambulatory Visit: Payer: Self-pay

## 2017-06-16 ENCOUNTER — Telehealth: Payer: Self-pay | Admitting: Neurology

## 2017-06-16 DIAGNOSIS — R531 Weakness: Secondary | ICD-10-CM

## 2017-06-16 DIAGNOSIS — I6523 Occlusion and stenosis of bilateral carotid arteries: Secondary | ICD-10-CM | POA: Diagnosis not present

## 2017-06-16 DIAGNOSIS — G252 Other specified forms of tremor: Secondary | ICD-10-CM

## 2017-06-16 DIAGNOSIS — R251 Tremor, unspecified: Secondary | ICD-10-CM | POA: Diagnosis not present

## 2017-06-16 DIAGNOSIS — I6521 Occlusion and stenosis of right carotid artery: Secondary | ICD-10-CM

## 2017-06-16 MED ORDER — IOPAMIDOL (ISOVUE-370) INJECTION 76%
75.0000 mL | Freq: Once | INTRAVENOUS | Status: AC | PRN
  Administered 2017-06-16: 75 mL via INTRAVENOUS

## 2017-06-16 MED ORDER — CLOPIDOGREL BISULFATE 75 MG PO TABS: 75.0000 mg | ORAL_TABLET | Freq: Every day | ORAL | 11 refills | Status: DC

## 2017-06-16 MED ORDER — SIMVASTATIN 20 MG PO TABS: ORAL_TABLET | ORAL | 6 refills | Status: DC

## 2017-06-16 NOTE — Telephone Encounter (Signed)
Called patient's home phone and daughter's number listed re: CTA results showing critical stenosis of the right ICA. Urgent referral to be sent to Vascular Surgery. Would add on Plavix to aspirin, and increase simvastatin to 33m daily. Asked patient/daughter to call back for instructions.

## 2017-06-16 NOTE — Telephone Encounter (Signed)
Discussed CTA results with daughter, Maudie Mercury. Discussed adding on Plavix 28m daily to aspirin, and increasing simvastatin to 434mdaily. Discussed urgent referral to Vascular Surgery. She had been instructed to stop 2 BP meds, BP is "all over the place," daughter will call with the daily log patient's husband has been doing.

## 2017-06-17 DIAGNOSIS — Z6830 Body mass index (BMI) 30.0-30.9, adult: Secondary | ICD-10-CM | POA: Diagnosis not present

## 2017-06-17 DIAGNOSIS — J4541 Moderate persistent asthma with (acute) exacerbation: Secondary | ICD-10-CM | POA: Diagnosis not present

## 2017-06-18 ENCOUNTER — Other Ambulatory Visit: Payer: Self-pay

## 2017-06-19 ENCOUNTER — Other Ambulatory Visit: Payer: Self-pay

## 2017-06-19 ENCOUNTER — Telehealth: Payer: Self-pay | Admitting: Neurology

## 2017-06-19 DIAGNOSIS — I6522 Occlusion and stenosis of left carotid artery: Secondary | ICD-10-CM

## 2017-06-19 DIAGNOSIS — I6521 Occlusion and stenosis of right carotid artery: Secondary | ICD-10-CM

## 2017-06-19 NOTE — Telephone Encounter (Signed)
Patient daughter still has not heard anything from the vascular place and she is concerned since the referral was to be placed as urgent please call her today and let her know the status   You may call her at the [phone] number until 3:00 today

## 2017-06-19 NOTE — Telephone Encounter (Signed)
Called [facility].  Transferred to scheduling.  LMOM asking for status and return call.

## 2017-06-20 ENCOUNTER — Other Ambulatory Visit: Payer: Self-pay

## 2017-06-20 NOTE — Patient Outreach (Signed)
[place] Carol Richmond) Care Management  06/20/2017  LINAH KLAPPER [birth year] [id number]  BSW called patient to follow up on social work referral regarding the need for a shower chair and wheelchair.  Patient reported that she and her husband remembered that they had a shower chair from a past surgery that he had so she is utilizing it.  Patient reported that the chair is in good condition and she is able to get in and out of the tub safely.  Per patient, she inquired with her doctor again about writing an order for a wheelchair but he declined.  Patient requested that BSW speak with her husband about this social work referral but he was not home at the time.  BSW will call back next week to speak with him.  Ronn Melena, BSW Social Worker [phone]

## 2017-06-23 ENCOUNTER — Encounter: Payer: Self-pay | Admitting: Neurology

## 2017-06-23 ENCOUNTER — Encounter

## 2017-06-23 ENCOUNTER — Ambulatory Visit: Payer: Self-pay

## 2017-06-23 ENCOUNTER — Ambulatory Visit: Payer: PPO | Admitting: Neurology

## 2017-06-23 ENCOUNTER — Other Ambulatory Visit: Payer: Self-pay

## 2017-06-23 ENCOUNTER — Ambulatory Visit: Payer: Self-pay | Admitting: Neurology

## 2017-06-23 VITALS — BP 124/66 | HR 91 | Ht 64.0 in | Wt 168.0 lb

## 2017-06-23 DIAGNOSIS — I6521 Occlusion and stenosis of right carotid artery: Secondary | ICD-10-CM

## 2017-06-23 DIAGNOSIS — G252 Other specified forms of tremor: Secondary | ICD-10-CM

## 2017-06-23 NOTE — Progress Notes (Signed)
NEUROLOGY FOLLOW UP OFFICE NOTE  Carol Richmond [id number]  DOB: 03-09-1950  HISTORY OF PRESENT ILLNESS: I had the pleasure of seeing Carol Richmond in follow-up in the neurology clinic on 06/23/2017.  The patient was last seen 3 weeks ago for new symptoms of leg shaking that occur when standing or usually when getting out of the car, more on the left. At one point family noted left facial droop. She was noted to have orthostatic hypotension on her last visit and was instructed to stop BP medications except Lasix. She is taking 37m BID. She had been to the ER on 05/28/17 for similar spells and had an unremarkable MRI brain. She underwent CTA head and neck imaging which showed critical stenosis of the right ICA with string sign, estimated greater than 90% stenosis in the right proximal ICA. She was instructed to add on Plavix to aspirin, and is scheduled to see Vascular Surgery next week. With discontinuation of BP medication, her SBP runs between 94 to 124. Her family has noticed a reduction in the shaking spells, she was initially having them multiple times a day, the last episode was 4 days ago, again while getting out of the car. She has an O2 via nasal cannula today for COPD and voice is hoarse due to allergies. She denies any new complaints.   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 Records from her neurologist from 2009 to 2013 were reviewed. She was having episodes of abnormal smell like inhaling anesthesia, followed by breathing problems, numbness, disorientation, memory loss, difficulty finding words, and mild staggering gait. She was started on Keppra, then switched to Topamax. A 48-hour EEG in 2010 captured 2 typical spells with no associated EEG changes. Baseline EEG was normal. She started seeing a psychologist and psychiatrist, Topamax was tapered and she was started on  Lamictal for mood stabilization. She was told by the psychologist that she has "absent seizures" and that "the left side of her brain is getting worse, and this could be affected by her sleep apnea." [facility]who felt the episodes of unresponsiveness could be due to sleep attacks. She has a diagnosis of obstructive sleep apnea. It appears the spells resolved in 2011.   Since November 2014 or so, she has had episodes of feeling off balance with speech difficulties where speech is slurred and she is unable to find appropriate words. [facility], could not keep her balance, then felt her speech was going. They were at a birthday party on 05/09/13 when it looked like she was waking up, then started "acting like a drunk person." She has been having episodes of blanking out "like she is out in space," occurring 2-3 times a week. She states these are different from the spells she had in 2009. Her husband reports that each time is worse than the other. She has been having headaches on a near daily basis. Headaches are over the vertex or bilateral temporal regions. She hears a "boom" inside her head and wants to pull her hair out. There is no associated nausea/vomiting/photo or phonophobia. She asked if these episodes can be caused by stress, reporting that she has been having more episodes since she has been taking care of her sister the past 3 months. She would "cry over nothing." She has been taking  Lexapro for many years and feels that "it does not seem to be working." She takes Ativan 71m twice a day for anxiety.   Update 05/30/17: Since her last visit, she has been to the ER last 05/28/17 for recurrent episodes of shaking, that day she was noted to have left facial droop and slurred speech. Her daughter reports that for the past 2 weeks, she would have episodes where she legs would start shaking. They would last 2-5 minutes,  she would stop and stand still for a few minutes then symptoms resolve. They always occur while up walking, never when sitting or supine. Her daughter has noticed them mostly occurring when she has been sitting in the car then gets out. The worst episode was 2 weeks ago Sunday, she went to open the door then started having the leg shaking and thought she was going down the banister. She went in the house and started sliding down, family got her to a chair and noted her mouth was drawn to the left. Another time her daughter noticed both hands were clenched. She would have multiple events in a day sometimes, so far today she has had 3. She had one while getting on to the scale in the office today, her daughter could tell she was having them, she started to have shaking on the legs and arms, L>R, leaned to her daughter on the right, lasting less than a minute. No post-event confusion. She denies any dizziness. Her daughter reports her BP is low most of the time, SBP usually under 100.   Diagnostic Data: I personally reviewed MRI brain without contrast done 05/25/17 which did not show any acute changes, there was mild to moderate chronic microvascular disease, R>L Her 24-hour EEG was mildly abnormal due to mild diffuse slowing of the background, no epileptiform discharges or electrographic seizures seen. She did not complete diary, but tells me today that she did have an episode where she felt she had a seizure and looked like she fell asleep.   PAST MEDICAL HISTORY: Past Medical History:  Diagnosis Date  . Asthma   . Diabetes mellitus without complication ([place]   . Diarrhea   . Diverticulosis of colon (without mention of hemorrhage)   . Functional ovarian cysts   . GERD (gastroesophageal reflux disease)   . Hemorrhoids, external, without mention of complication   . Hormone replacement therapy (postmenopausal)   . Hypertension   . IBS (irritable bowel syndrome)   . Migraine   . Right knee DJD   .  Seizures ([place]   . Shortness of breath   . Sleep apnea    no cpap    study 2 yrs ago    MEDICATIONS:  Outpatient Encounter Medications as of 06/23/2017  Medication Sig  . acyclovir (ZOVIRAX) 800 MG tablet Take 800 mg by mouth daily.   .Marland Kitchenalbuterol (PROVENTIL HFA;VENTOLIN HFA) 108 (90 BASE) MCG/ACT inhaler Inhale 2 puffs into the lungs every 6 (six) hours as needed for wheezing or shortness of breath.  .Marland Kitchenamitriptyline (ELAVIL) 50 MG tablet Take 1 tablet (50 mg total) by mouth at bedtime.  .Marland Kitchenaspirin EC 81 MG tablet Take by mouth.  . busPIRone (BUSPAR) 15 MG tablet Take 2 tablets by mouth twice daily  . cholecalciferol (VITAMIN D) 1000 UNITS tablet Take 1,000 Units by mouth daily.  . clopidogrel (PLAVIX) 75 MG tablet Take 1 tablet (75 mg total) by mouth daily.  .Marland Kitchendonepezil (ARICEPT) 5 MG tablet Take 1 tablet (5 mg  total) by mouth at bedtime.  Carol Richmond Bandages & Supports (ABDOMINAL BINDER/ELASTIC MED) MISC Apply binder each day  . Elastic Bandages & Supports (MEDICAL COMPRESSION STOCKINGS) MISC 2 Devices by Does not apply route daily.  . Fluticasone Furoate-Vilanterol (BREO ELLIPTA IN) Inhale into the lungs.  . furosemide (LASIX) 10 MG/ML injection 20 mg.  . gabapentin (NEURONTIN) 100 MG capsule Take 100 mg by mouth 3 (three) times daily.  [name] lamoTRIgine (LAMICTAL) 200 MG tablet Take 200 mg by mouth 2 (two) times daily.   [name] levocetirizine (XYZAL) 5 MG tablet Take 5 mg by mouth every evening.  [name] lisinopril-hydrochlorothiazide (PRINZIDE,ZESTORETIC) 20-12.5 MG per tablet Take 1 tablet by mouth daily.   . meloxicam (MOBIC) 7.5 MG tablet Take 7.5 mg by mouth daily.  . metFORMIN (GLUCOPHAGE) 1000 MG tablet Take 1,000 mg by mouth 2 (two) times daily with a meal.  . metoprolol succinate (TOPROL XL) 50 MG 24 hr tablet Take 1 tablet (50 mg total) by mouth daily.  [name] omeprazole (PRILOSEC) 20 MG capsule Take 20 mg by mouth daily.  . OXYGEN Inhale 2 L into the lungs.  . simvastatin (ZOCOR) 20 MG tablet  Take 2 tablets daily  . venlafaxine XR (EFFEXOR-XR) 150 MG 24 hr capsule Take total of 187.5 (150+37.5) mg daily  . venlafaxine XR (EFFEXOR-XR) 37.5 MG 24 hr capsule Take total of 187.5 mg daily (150 mg + 37.5 mg)   No facility-administered encounter medications on file as of 06/23/2017.      ALLERGIES: No Known Allergies  FAMILY HISTORY: Family History  Problem Relation Age of Onset  . Heart disease Mother   . Diabetes Mother   . Heart disease Brother   . Cancer Sister   . Heart disease Brother   . Heart disease Brother     SOCIAL HISTORY: Social History   Socioeconomic History  . Marital status: Married    Spouse name: Not on file  . Number of children: Not on file  . Years of education: Not on file  . Highest education level: Not on file  Occupational History  . Not on file  Social Needs  . Financial resource strain: Not on file  . Food insecurity:    Worry: Not on file    Inability: Not on file  . Transportation needs:    Medical: Not on file    Non-medical: Not on file  Tobacco Use  . Smoking status: Never Smoker  . Smokeless tobacco: Never Used  Substance and Sexual Activity  . Alcohol use: No    Alcohol/week: 0.0 oz  . Drug use: No  . Sexual activity: Not on file  Lifestyle  . Physical activity:    Days per week: Not on file    Minutes per session: Not on file  . Stress: Not on file  Relationships  . Social connections:    Talks on phone: Not on file    Gets together: Not on file    Attends religious service: Not on file    Active member of club or organization: Not on file    Attends meetings of clubs or organizations: Not on file    Relationship status: Not on file  . Intimate partner violence:    Fear of current or ex partner: Not on file    Emotionally abused: Not on file    Physically abused: Not on file    Forced sexual activity: Not on file  Other Topics Concern  . Not on file  Social History Narrative  . Not on file    REVIEW OF  SYSTEMS: Constitutional: No fevers, chills, or sweats, no generalized fatigue, change in appetite Eyes: No visual changes, double vision, eye pain Ear, nose and throat: No hearing loss, ear pain, nasal congestion, sore throat Cardiovascular: No chest pain, palpitations Respiratory:  No shortness of breath at rest or with exertion, wheezes GastrointestinaI: No nausea, vomiting, diarrhea, abdominal pain, fecal incontinence Genitourinary:  No dysuria, urinary retention or frequency Musculoskeletal:  No neck pain, back pain Integumentary: No rash, pruritus, skin lesions Neurological: as above Psychiatric: + depression, no insomnia, + anxiety Endocrine: No palpitations, fatigue, diaphoresis, mood swings, change in appetite, change in weight, increased thirst Hematologic/Lymphatic:  No anemia, purpura, petechiae. Allergic/Immunologic: no itchy/runny eyes, nasal congestion, recent allergic reactions, rashes  PHYSICAL EXAM: Vitals:   06/23/17 1315  BP: 124/66  Pulse: 91  SpO2: 100%   General: No acute distress, on O2 via [place] Head:  Normocephalic/atraumatic Neck: supple, no paraspinal tenderness, full range of motion Heart:  Regular rate and rhythm Lungs:  Clear to auscultation bilaterally Back: No paraspinal tenderness Skin/Extremities: No rash, no edema Neurological Exam: alert and oriented to person, place, and time. No aphasia or dysarthria, voice hoarse. Fund of knowledge is appropriate.  Recent and remote memory are intact.Attention and concentration are normal.    Able to name objects and repeat phrases. Cranial nerves: Pupils equal, round, reactive to light.  Extraocular movements intact with no nystagmus. Visual fields full. Facial sensation intact. No facial asymmetry. Tongue, uvula, palate midline.  Motor: Bulk and tone normal, muscle strength 5/5 throughout with no pronator drift.  Sensation to light touch, pin, vibration intact.  No extinction to double simultaneous stimulation.   Deep tendon reflexes: +2 on both UE, unable to elicit right patella reflex, +2 on left patella, both ankles, toes downgoing.  Finger to nose testing intact.  Gait slow and cautious with walker, no ataxia.Romberg negative.  IMPRESSION: This is a 66 yo RH woman with a history of diabetes, hyperlipidemia, headaches, and history of non-epileptic episodes of decreased responsiveness, who initially presented with episodes of feeling off balance with slurred speech. MRI brain and 24-hour EEG unremarkable. No further episodes of decreased responsiveness. Headaches are fairly controlled on amitriptyline 85m qhs. She presented last month with new symptoms of leg shaking when standing, mostly affecting the left side. At one point, the left side of her face was drawn. MRI brain no acute changes. Symptoms only occur when standing. She had orthostatic hypotension noted and has stopped BP medications except Lasix. Her CTA neck showed critical stenosis of the right proximal ICA, likely causing hypoperfusion when she is hypotensive. Spells have decreased since her last visit, she is now on dual therapy with aspirin and Plavix for symptomatic carotid stenosis, and is scheduled to see Dr. BTrula Sladewith Vascular surgery next week. Continue to avoid hypotension, increase fluid intake. They know to go to the ER for any sudden change in symptoms. She has mild cognitive impairment and asks questions repeatedly, family was instructed to be with her at all doctor appointments. She will follow-up in 4 months, they know to call for any changes.   Thank you for allowing me to participate in her care.  Please do not hesitate to call for any questions or concerns.  The duration of this appointment visit was 30 minutes of face-to-face time with the patient.  Greater than 50% of this time was spent in counseling, explanation of diagnosis, planning of  further management, and coordination of care.   Ellouise Newer, M.D.   CC: Dr.  Sherrie Sport

## 2017-06-23 NOTE — Patient Instructions (Signed)
1. Proceed with Vascular Surgery evaluation 2. Continue all your medications 3. Follow-up in 4 months, call for any changes

## 2017-06-24 ENCOUNTER — Other Ambulatory Visit: Payer: Self-pay

## 2017-06-24 NOTE — Patient Outreach (Signed)
[facility]) Care Management  06/24/2017  Carol Richmond Dec 23, 1950 [id number]   BSW attempted to contact husband regarding social work referral as requested by patient. BSW left voicemail message.  BSW will make second attempt within 3-4 business days.    Carol Richmond, BSW Social Worker [phone]

## 2017-06-30 ENCOUNTER — Other Ambulatory Visit: Payer: Self-pay

## 2017-06-30 ENCOUNTER — Encounter: Payer: Self-pay | Admitting: *Deleted

## 2017-06-30 ENCOUNTER — Encounter: Payer: Self-pay | Admitting: Surgery

## 2017-06-30 ENCOUNTER — Other Ambulatory Visit: Payer: Self-pay | Admitting: *Deleted

## 2017-06-30 ENCOUNTER — Ambulatory Visit (INDEPENDENT_AMBULATORY_CARE_PROVIDER_SITE_OTHER): Payer: PPO | Admitting: Surgery

## 2017-06-30 ENCOUNTER — Ambulatory Visit (INDEPENDENT_AMBULATORY_CARE_PROVIDER_SITE_OTHER)
Admission: RE | Admit: 2017-06-30 | Discharge: 2017-06-30 | Disposition: A | Discharge status: Home / Self Care | Payer: PPO | Source: Ambulatory Visit | Attending: Surgery | Admitting: Surgery

## 2017-06-30 VITALS — BP 114/77 | HR 99 | Temp 98.2°F | Resp 18 | Ht 64.0 in | Wt 166.0 lb

## 2017-06-30 DIAGNOSIS — R531 Weakness: Secondary | ICD-10-CM | POA: Diagnosis not present

## 2017-06-30 DIAGNOSIS — I6521 Occlusion and stenosis of right carotid artery: Secondary | ICD-10-CM

## 2017-06-30 DIAGNOSIS — I6522 Occlusion and stenosis of left carotid artery: Secondary | ICD-10-CM | POA: Diagnosis not present

## 2017-06-30 DIAGNOSIS — Z79890 Hormone replacement therapy (postmenopausal): Secondary | ICD-10-CM | POA: Diagnosis not present

## 2017-06-30 DIAGNOSIS — M1711 Unilateral primary osteoarthritis, right knee: Secondary | ICD-10-CM | POA: Diagnosis present

## 2017-06-30 DIAGNOSIS — K644 Residual hemorrhoidal skin tags: Secondary | ICD-10-CM | POA: Diagnosis not present

## 2017-06-30 DIAGNOSIS — R4701 Aphasia: Secondary | ICD-10-CM | POA: Diagnosis not present

## 2017-06-30 DIAGNOSIS — Z7902 Long term (current) use of antithrombotics/antiplatelets: Secondary | ICD-10-CM | POA: Diagnosis not present

## 2017-06-30 DIAGNOSIS — K589 Irritable bowel syndrome without diarrhea: Secondary | ICD-10-CM | POA: Diagnosis present

## 2017-06-30 DIAGNOSIS — I10 Essential (primary) hypertension: Secondary | ICD-10-CM | POA: Diagnosis present

## 2017-06-30 DIAGNOSIS — Z791 Long term (current) use of non-steroidal anti-inflammatories (NSAID): Secondary | ICD-10-CM | POA: Diagnosis not present

## 2017-06-30 DIAGNOSIS — J449 Chronic obstructive pulmonary disease, unspecified: Secondary | ICD-10-CM | POA: Diagnosis present

## 2017-06-30 DIAGNOSIS — Z9981 Dependence on supplemental oxygen: Secondary | ICD-10-CM | POA: Diagnosis not present

## 2017-06-30 DIAGNOSIS — D62 Acute posthemorrhagic anemia: Secondary | ICD-10-CM | POA: Diagnosis not present

## 2017-06-30 DIAGNOSIS — E119 Type 2 diabetes mellitus without complications: Secondary | ICD-10-CM | POA: Diagnosis present

## 2017-06-30 DIAGNOSIS — Z7984 Long term (current) use of oral hypoglycemic drugs: Secondary | ICD-10-CM | POA: Diagnosis not present

## 2017-06-30 DIAGNOSIS — E785 Hyperlipidemia, unspecified: Secondary | ICD-10-CM | POA: Diagnosis present

## 2017-06-30 DIAGNOSIS — Z8673 Personal history of transient ischemic attack (TIA), and cerebral infarction without residual deficits: Secondary | ICD-10-CM | POA: Diagnosis not present

## 2017-06-30 DIAGNOSIS — R05 Cough: Secondary | ICD-10-CM | POA: Diagnosis not present

## 2017-06-30 DIAGNOSIS — I340 Nonrheumatic mitral (valve) insufficiency: Secondary | ICD-10-CM | POA: Diagnosis present

## 2017-06-30 DIAGNOSIS — Z79899 Other long term (current) drug therapy: Secondary | ICD-10-CM | POA: Diagnosis not present

## 2017-06-30 DIAGNOSIS — E7800 Pure hypercholesterolemia, unspecified: Secondary | ICD-10-CM | POA: Diagnosis present

## 2017-06-30 DIAGNOSIS — G4730 Sleep apnea, unspecified: Secondary | ICD-10-CM | POA: Diagnosis present

## 2017-06-30 DIAGNOSIS — I447 Left bundle-branch block, unspecified: Secondary | ICD-10-CM | POA: Diagnosis present

## 2017-06-30 DIAGNOSIS — Z7982 Long term (current) use of aspirin: Secondary | ICD-10-CM | POA: Diagnosis not present

## 2017-06-30 DIAGNOSIS — I361 Nonrheumatic tricuspid (valve) insufficiency: Secondary | ICD-10-CM | POA: Diagnosis not present

## 2017-06-30 DIAGNOSIS — K219 Gastro-esophageal reflux disease without esophagitis: Secondary | ICD-10-CM | POA: Diagnosis present

## 2017-06-30 DIAGNOSIS — F419 Anxiety disorder, unspecified: Secondary | ICD-10-CM | POA: Diagnosis present

## 2017-06-30 NOTE — Patient Outreach (Signed)
[place] [facility]) Care Management  06/30/2017  DESTA BUJAK Jan 25, 1951 [id number]   BSW left voicemail message for patient.  Will call again within 3-4 business days.    Ronn Melena, BSW Social Worker [phone]

## 2017-06-30 NOTE — Progress Notes (Signed)
[facility] of [place]  Patient name: Carol Richmond MRN: [id number] DOB: 10/07/1950 Sex: female   REQUESTING PROVIDER:    Dr. Delice Lesch   REASON FOR CONSULT:    carotid stenosois  HISTORY OF PRESENT ILLNESS:   Carol Richmond is a 67 y.o. female, who is referred today for evaluation of symptomatic right carotid stenosis.  Several weeks ago she was noted to have a left facial droop.  This was felt to be associated with orthostatic hypotension.  The family reports that she initially was having 4-6 spells a day however they improved to 2 to 3/day by stopping her blood pressure medicine.  She has had an MRI which was negative for an acute infarct.  She describes her symptoms have dropping things out of her left arm and nearly falling because she loses balance because her left leg also goes weak.  She has not had any vision loss.  She has not had any slurred speech but has had the left corner of her mouth drop.  She was started on aspirin and Plavix.  Patient medically managed for hypertension however her blood pressure has been low and so all of her medicine has been stopped.  She takes a statin for hypercholesterolemia.  She is a diabetic but states that her blood sugars have been under good control.  She does have COPD and shortness of breath  PAST MEDICAL HISTORY    Past Medical History:  Diagnosis Date  . Asthma   . Diabetes mellitus without complication ([place])   . Diarrhea   . Diverticulosis of colon (without mention of hemorrhage)   . Functional ovarian cysts   . GERD (gastroesophageal reflux disease)   . Hemorrhoids, external, without mention of complication   . Hormone replacement therapy (postmenopausal)   . Hypertension   . IBS (irritable bowel syndrome)   . Migraine   . Right knee DJD   . Seizures ([place])   . Shortness of breath   . Sleep apnea    no cpap    study 2 yrs ago     FAMILY HISTORY   Family  History  Problem Relation Age of Onset  . Heart disease Mother   . Diabetes Mother   . Heart disease Brother   . Cancer Sister   . Heart disease Brother   . Heart disease Brother   . Heart disease Son     SOCIAL HISTORY:   Social History   Socioeconomic History  . Marital status: Married    Spouse name: Not on file  . Number of children: Not on file  . Years of education: Not on file  . Highest education level: Not on file  Occupational History  . Not on file  Social Needs  . Financial resource strain: Not on file  . Food insecurity:    Worry: Not on file    Inability: Not on file  . Transportation needs:    Medical: Not on file    Non-medical: Not on file  Tobacco Use  . Smoking status: Never Smoker  . Smokeless tobacco: Never Used  Substance and Sexual Activity  . Alcohol use: No    Alcohol/week: 0.0 oz  . Drug use: No  . Sexual activity: Not on file  Lifestyle  . Physical activity:    Days per week: Not on file    Minutes per session: Not on file  . Stress: Not on file  Relationships  . Social connections:  Talks on phone: Not on file    Gets together: Not on file    Attends religious service: Not on file    Active member of club or organization: Not on file    Attends meetings of clubs or organizations: Not on file    Relationship status: Not on file  . Intimate partner violence:    Fear of current or ex partner: Not on file    Emotionally abused: Not on file    Physically abused: Not on file    Forced sexual activity: Not on file  Other Topics Concern  . Not on file  Social History Narrative  . Not on file    ALLERGIES:    No Known Allergies  CURRENT MEDICATIONS:    Current Outpatient Medications  Medication Sig Dispense Refill  . acyclovir (ZOVIRAX) 800 MG tablet Take 800 mg by mouth daily.     [name] albuterol (PROVENTIL HFA;VENTOLIN HFA) 108 (90 BASE) MCG/ACT inhaler Inhale 2 puffs into the lungs every 6 (six) hours as needed for wheezing  or shortness of breath. 1 Inhaler 3  . amitriptyline (ELAVIL) 50 MG tablet Take 1 tablet (50 mg total) by mouth at bedtime. 90 tablet 3  . aspirin EC 81 MG tablet Take by mouth.    . busPIRone (BUSPAR) 15 MG tablet Take 2 tablets by mouth twice daily    . cholecalciferol (VITAMIN D) 1000 UNITS tablet Take 1,000 Units by mouth daily.    . clopidogrel (PLAVIX) 75 MG tablet Take 1 tablet (75 mg total) by mouth daily. 30 tablet 11  . Elastic Bandages & Supports (ABDOMINAL BINDER/ELASTIC MED) MISC Apply binder each day 1 each 0  . Elastic Bandages & Supports (MEDICAL COMPRESSION STOCKINGS) MISC 2 Devices by Does not apply route daily. 2 each 0  . furosemide (LASIX) 20 MG tablet Take 20 mg by mouth 2 (two) times daily.    [name] lamoTRIgine (LAMICTAL) 200 MG tablet Take 200 mg by mouth 2 (two) times daily.     [name] levocetirizine (XYZAL) 5 MG tablet Take 5 mg by mouth every evening.    . metFORMIN (GLUCOPHAGE) 1000 MG tablet Take 1,000 mg by mouth 2 (two) times daily with a meal.    . omeprazole (PRILOSEC) 20 MG capsule Take 20 mg by mouth daily.    . OXYGEN Inhale 2 L into the lungs.    . simvastatin (ZOCOR) 20 MG tablet Take 2 tablets daily 60 tablet 6  . venlafaxine XR (EFFEXOR-XR) 150 MG 24 hr capsule Take total of 187.5 (150+37.5) mg daily 90 capsule 0  . venlafaxine XR (EFFEXOR-XR) 37.5 MG 24 hr capsule Take total of 187.5 mg daily (150 mg + 37.5 mg) 90 capsule 0  . donepezil (ARICEPT) 5 MG tablet Take 1 tablet (5 mg total) by mouth at bedtime. (Patient not taking: Reported on 06/30/2017) 90 tablet 0  . Fluticasone Furoate-Vilanterol (BREO ELLIPTA IN) Inhale into the lungs.    . gabapentin (NEURONTIN) 100 MG capsule Take 100 mg by mouth 3 (three) times daily.    [name] lisinopril-hydrochlorothiazide (PRINZIDE,ZESTORETIC) 20-12.5 MG per tablet Take 1 tablet by mouth daily.     . meloxicam (MOBIC) 7.5 MG tablet Take 7.5 mg by mouth daily.    . metoprolol succinate (TOPROL XL) 50 MG 24 hr tablet Take 1 tablet  (50 mg total) by mouth daily. (Patient not taking: Reported on 06/30/2017) 30 tablet 6   No current facility-administered medications for this visit.     REVIEW  OF SYSTEMS:   [X]  denotes positive finding, [ ]  denotes negative finding Cardiac  Comments:  Chest pain or chest pressure:    Shortness of breath upon exertion: x   Short of breath when lying flat:    Irregular heart rhythm:        Vascular    Pain in calf, thigh, or hip brought on by ambulation:    Pain in feet at night that wakes you up from your sleep:     Blood clot in your veins:    Leg swelling:         Pulmonary    Oxygen at home: x   Productive cough:     Wheezing:         Neurologic    Sudden weakness in arms or legs:     Sudden numbness in arms or legs:     Sudden onset of difficulty speaking or slurred speech:    Temporary loss of vision in one eye:     Problems with dizziness:         Gastrointestinal    Blood in stool:      Vomited blood:         Genitourinary    Burning when urinating:     Blood in urine:        Psychiatric    Major depression:         Hematologic    Bleeding problems:    Problems with blood clotting too easily:        Skin    Rashes or ulcers:        Constitutional    Fever or chills:     PHYSICAL EXAM:   Vitals:   06/30/17 1212 06/30/17 1214  BP: 113/71 114/77  Pulse: 99   Resp: 18   Temp: 98.2 F (36.8 C)   TempSrc: Oral   SpO2: 96%   Weight: 166 lb (75.3 kg)   Height: 5' 4"  (1.626 m)     GENERAL: The patient is a well-nourished female, in no acute distress. The vital signs are documented above. CARDIAC: There is a regular rate and rhythm.  VASCULAR: carotid bruit PULMONARY: Nonlabored respirations MUSCULOSKELETAL: There are no major deformities or cyanosis. NEUROLOGIC: No focal weakness or paresthesias are detected. SKIN: There are no ulcers or rashes noted. PSYCHIATRIC: The patient has a normal affect.  STUDIES:   I have ordered and reviewed her  carotid duplex which shows greater than 80% right carotid stenosis with a high bifurcation.  I have also reviewed her CT angiogram again with Dr. Carlis Abbott.  There is a nearly occlusive lesion in the right common carotid artery.  The artery is tortuous and small distally but patent.  The bifurcation is high.  ASSESSMENT and PLAN   Symptomatic carotid stenosis: The patient is having daily TIAs.  These consist of left arm and leg weakness.  Her imaging studies reveal a high-grade right carotid stenosis.  I have discussed proceeding with urgent right carotid endarterectomy.  We discussed the risk benefits the operation including the risk of stroke and nerve injury.  We focused on the possibility of swallowing issues given the distal extent of the lesion.  We also talked about the bleeding risk given that she is on aspirin and Plavix.  Patient understands all the risk as well as her family.  We are going to proceed with her operation tomorrow   Annamarie Major, MD [facility]ists of [facility] [phone] Pager [phone] 

## 2017-06-30 NOTE — Progress Notes (Signed)
Gave pt pre-op instructions only. Pt instructed to check blood sugar when she gets up in the AM and every 2 hours until she leaves for the hospital.If blood sugar is 70 or below, treat with 1/2 cup of clear juice (apple or cranberry) and recheck blood sugar 15 minutes after drinking juice. If blood sugar continues to be 70 or below, call the Short Stay department and ask to speak to a nurse.

## 2017-06-30 NOTE — H&P (View-Only) (Signed)
Vascular and Vein Specialist of [place]  Patient name: Carol Richmond MRN: [id number] DOB: [birth year] Sex: female   REQUESTING PROVIDER:    Dr. Delice Lesch   REASON FOR CONSULT:    carotid stenosois  HISTORY OF PRESENT ILLNESS:   Carol Richmond is a 67 y.o. female, who is referred today for evaluation of symptomatic right carotid stenosis.  Several weeks ago she was noted to have a left facial droop.  This was felt to be associated with orthostatic hypotension.  The family reports that she initially was having 4-6 spells a day however they improved to 2 to 3/day by stopping her blood pressure medicine.  She has had an MRI which was negative for an acute infarct.  She describes her symptoms have dropping things out of her left arm and nearly falling because she loses balance because her left leg also goes weak.  She has not had any vision loss.  She has not had any slurred speech but has had the left corner of her mouth drop.  She was started on aspirin and Plavix.  Patient medically managed for hypertension however her blood pressure has been low and so all of her medicine has been stopped.  She takes a statin for hypercholesterolemia.  She is a diabetic but states that her blood sugars have been under good control.  She does have COPD and shortness of breath  PAST MEDICAL HISTORY    Past Medical History:  Diagnosis Date  . Asthma   . Diabetes mellitus without complication ([place])   . Diarrhea   . Diverticulosis of colon (without mention of hemorrhage)   . Functional ovarian cysts   . GERD (gastroesophageal reflux disease)   . Hemorrhoids, external, without mention of complication   . Hormone replacement therapy (postmenopausal)   . Hypertension   . IBS (irritable bowel syndrome)   . Migraine   . Right knee DJD   . Seizures ([place])   . Shortness of breath   . Sleep apnea    no cpap    study 2 yrs ago     FAMILY HISTORY   Family  History  Problem Relation Age of Onset  . Heart disease Mother   . Diabetes Mother   . Heart disease Brother   . Cancer Sister   . Heart disease Brother   . Heart disease Brother   . Heart disease Son     SOCIAL HISTORY:   Social History   Socioeconomic History  . Marital status: Married    Spouse name: Not on file  . Number of children: Not on file  . Years of education: Not on file  . Highest education level: Not on file  Occupational History  . Not on file  Social Needs  . Financial resource strain: Not on file  . Food insecurity:    Worry: Not on file    Inability: Not on file  . Transportation needs:    Medical: Not on file    Non-medical: Not on file  Tobacco Use  . Smoking status: Never Smoker  . Smokeless tobacco: Never Used  Substance and Sexual Activity  . Alcohol use: No    Alcohol/week: 0.0 oz  . Drug use: No  . Sexual activity: Not on file  Lifestyle  . Physical activity:    Days per week: Not on file    Minutes per session: Not on file  . Stress: Not on file  Relationships  . Social connections:  Talks on phone: Not on file    Gets together: Not on file    Attends religious service: Not on file    Active member of club or organization: Not on file    Attends meetings of clubs or organizations: Not on file    Relationship status: Not on file  . Intimate partner violence:    Fear of current or ex partner: Not on file    Emotionally abused: Not on file    Physically abused: Not on file    Forced sexual activity: Not on file  Other Topics Concern  . Not on file  Social History Narrative  . Not on file    ALLERGIES:    No Known Allergies  CURRENT MEDICATIONS:    Current Outpatient Medications  Medication Sig Dispense Refill  . acyclovir (ZOVIRAX) 800 MG tablet Take 800 mg by mouth daily.     [name] albuterol (PROVENTIL HFA;VENTOLIN HFA) 108 (90 BASE) MCG/ACT inhaler Inhale 2 puffs into the lungs every 6 (six) hours as needed for wheezing  or shortness of breath. 1 Inhaler 3  . amitriptyline (ELAVIL) 50 MG tablet Take 1 tablet (50 mg total) by mouth at bedtime. 90 tablet 3  . aspirin EC 81 MG tablet Take by mouth.    . busPIRone (BUSPAR) 15 MG tablet Take 2 tablets by mouth twice daily    . cholecalciferol (VITAMIN D) 1000 UNITS tablet Take 1,000 Units by mouth daily.    . clopidogrel (PLAVIX) 75 MG tablet Take 1 tablet (75 mg total) by mouth daily. 30 tablet 11  . Elastic Bandages & Supports (ABDOMINAL BINDER/ELASTIC MED) MISC Apply binder each day 1 each 0  . Elastic Bandages & Supports (MEDICAL COMPRESSION STOCKINGS) MISC 2 Devices by Does not apply route daily. 2 each 0  . furosemide (LASIX) 20 MG tablet Take 20 mg by mouth 2 (two) times daily.    [name] lamoTRIgine (LAMICTAL) 200 MG tablet Take 200 mg by mouth 2 (two) times daily.     [name] levocetirizine (XYZAL) 5 MG tablet Take 5 mg by mouth every evening.    . metFORMIN (GLUCOPHAGE) 1000 MG tablet Take 1,000 mg by mouth 2 (two) times daily with a meal.    . omeprazole (PRILOSEC) 20 MG capsule Take 20 mg by mouth daily.    . OXYGEN Inhale 2 L into the lungs.    . simvastatin (ZOCOR) 20 MG tablet Take 2 tablets daily 60 tablet 6  . venlafaxine XR (EFFEXOR-XR) 150 MG 24 hr capsule Take total of 187.5 (150+37.5) mg daily 90 capsule 0  . venlafaxine XR (EFFEXOR-XR) 37.5 MG 24 hr capsule Take total of 187.5 mg daily (150 mg + 37.5 mg) 90 capsule 0  . donepezil (ARICEPT) 5 MG tablet Take 1 tablet (5 mg total) by mouth at bedtime. (Patient not taking: Reported on 06/30/2017) 90 tablet 0  . Fluticasone Furoate-Vilanterol (BREO ELLIPTA IN) Inhale into the lungs.    . gabapentin (NEURONTIN) 100 MG capsule Take 100 mg by mouth 3 (three) times daily.    [name] lisinopril-hydrochlorothiazide (PRINZIDE,ZESTORETIC) 20-12.5 MG per tablet Take 1 tablet by mouth daily.     . meloxicam (MOBIC) 7.5 MG tablet Take 7.5 mg by mouth daily.    . metoprolol succinate (TOPROL XL) 50 MG 24 hr tablet Take 1 tablet  (50 mg total) by mouth daily. (Patient not taking: Reported on 06/30/2017) 30 tablet 6   No current facility-administered medications for this visit.     REVIEW  OF SYSTEMS:   [X]  denotes positive finding, [ ]  denotes negative finding Cardiac  Comments:  Chest pain or chest pressure:    Shortness of breath upon exertion: x   Short of breath when lying flat:    Irregular heart rhythm:        Vascular    Pain in calf, thigh, or hip brought on by ambulation:    Pain in feet at night that wakes you up from your sleep:     Blood clot in your veins:    Leg swelling:         Pulmonary    Oxygen at home: x   Productive cough:     Wheezing:         Neurologic    Sudden weakness in arms or legs:     Sudden numbness in arms or legs:     Sudden onset of difficulty speaking or slurred speech:    Temporary loss of vision in one eye:     Problems with dizziness:         Gastrointestinal    Blood in stool:      Vomited blood:         Genitourinary    Burning when urinating:     Blood in urine:        Psychiatric    Major depression:         Hematologic    Bleeding problems:    Problems with blood clotting too easily:        Skin    Rashes or ulcers:        Constitutional    Fever or chills:     PHYSICAL EXAM:   Vitals:   06/30/17 1212 06/30/17 1214  BP: 113/71 114/77  Pulse: 99   Resp: 18   Temp: 98.2 F (36.8 C)   TempSrc: Oral   SpO2: 96%   Weight: 166 lb (75.3 kg)   Height: 5' 4"  (1.626 m)     GENERAL: The patient is a well-nourished female, in no acute distress. The vital signs are documented above. CARDIAC: There is a regular rate and rhythm.  VASCULAR: carotid bruit PULMONARY: Nonlabored respirations MUSCULOSKELETAL: There are no major deformities or cyanosis. NEUROLOGIC: No focal weakness or paresthesias are detected. SKIN: There are no ulcers or rashes noted. PSYCHIATRIC: The patient has a normal affect.  STUDIES:   I have ordered and reviewed her  carotid duplex which shows greater than 80% right carotid stenosis with a high bifurcation.  I have also reviewed her CT angiogram again with Dr. Carlis Abbott.  There is a nearly occlusive lesion in the right common carotid artery.  The artery is tortuous and small distally but patent.  The bifurcation is high.  ASSESSMENT and PLAN   Symptomatic carotid stenosis: The patient is having daily TIAs.  These consist of left arm and leg weakness.  Her imaging studies reveal a high-grade right carotid stenosis.  I have discussed proceeding with urgent right carotid endarterectomy.  We discussed the risk benefits the operation including the risk of stroke and nerve injury.  We focused on the possibility of swallowing issues given the distal extent of the lesion.  We also talked about the bleeding risk given that she is on aspirin and Plavix.  Patient understands all the risk as well as her family.  We are going to proceed with her operation tomorrow   Annamarie Major, MD [facility] Special[facility] of [facility] [phone] Pager [phone]  [phone] 

## 2017-06-30 NOTE — Progress Notes (Signed)
Phone call to [facility] at Pre-Admission that patient scheduled for 07/01/17.

## 2017-07-01 ENCOUNTER — Encounter (HOSPITAL_COMMUNITY)
Admission: RE | Disposition: A | Discharge status: Home / Self Care | Payer: Self-pay | Source: Ambulatory Visit | Attending: Surgery

## 2017-07-01 ENCOUNTER — Inpatient Hospital Stay (HOSPITAL_COMMUNITY)
Admission: RE | Admit: 2017-07-01 | Discharge: 2017-07-03 | DRG: 038 | Disposition: A | Discharge status: Home / Self Care | Payer: PPO | Source: Ambulatory Visit | Attending: Surgery | Admitting: Surgery

## 2017-07-01 ENCOUNTER — Inpatient Hospital Stay (HOSPITAL_COMMUNITY): Payer: PPO | Admitting: Anesthesiology

## 2017-07-01 ENCOUNTER — Encounter (HOSPITAL_COMMUNITY): Payer: Self-pay

## 2017-07-01 DIAGNOSIS — R4701 Aphasia: Secondary | ICD-10-CM | POA: Diagnosis not present

## 2017-07-01 DIAGNOSIS — Z7982 Long term (current) use of aspirin: Secondary | ICD-10-CM

## 2017-07-01 DIAGNOSIS — Z79890 Hormone replacement therapy (postmenopausal): Secondary | ICD-10-CM | POA: Diagnosis not present

## 2017-07-01 DIAGNOSIS — I10 Essential (primary) hypertension: Secondary | ICD-10-CM | POA: Diagnosis present

## 2017-07-01 DIAGNOSIS — Z7902 Long term (current) use of antithrombotics/antiplatelets: Secondary | ICD-10-CM

## 2017-07-01 DIAGNOSIS — R531 Weakness: Secondary | ICD-10-CM | POA: Diagnosis not present

## 2017-07-01 DIAGNOSIS — E7800 Pure hypercholesterolemia, unspecified: Secondary | ICD-10-CM | POA: Diagnosis present

## 2017-07-01 DIAGNOSIS — Z79899 Other long term (current) drug therapy: Secondary | ICD-10-CM | POA: Diagnosis not present

## 2017-07-01 DIAGNOSIS — D62 Acute posthemorrhagic anemia: Secondary | ICD-10-CM | POA: Diagnosis not present

## 2017-07-01 DIAGNOSIS — G4730 Sleep apnea, unspecified: Secondary | ICD-10-CM | POA: Diagnosis present

## 2017-07-01 DIAGNOSIS — E119 Type 2 diabetes mellitus without complications: Secondary | ICD-10-CM | POA: Diagnosis present

## 2017-07-01 DIAGNOSIS — Z791 Long term (current) use of non-steroidal anti-inflammatories (NSAID): Secondary | ICD-10-CM

## 2017-07-01 DIAGNOSIS — E785 Hyperlipidemia, unspecified: Secondary | ICD-10-CM | POA: Diagnosis present

## 2017-07-01 DIAGNOSIS — I340 Nonrheumatic mitral (valve) insufficiency: Secondary | ICD-10-CM | POA: Diagnosis present

## 2017-07-01 DIAGNOSIS — K589 Irritable bowel syndrome without diarrhea: Secondary | ICD-10-CM | POA: Diagnosis present

## 2017-07-01 DIAGNOSIS — Z8673 Personal history of transient ischemic attack (TIA), and cerebral infarction without residual deficits: Secondary | ICD-10-CM | POA: Diagnosis not present

## 2017-07-01 DIAGNOSIS — Z7984 Long term (current) use of oral hypoglycemic drugs: Secondary | ICD-10-CM | POA: Diagnosis not present

## 2017-07-01 DIAGNOSIS — K5730 Diverticulosis of large intestine without perforation or abscess without bleeding: Secondary | ICD-10-CM | POA: Diagnosis present

## 2017-07-01 DIAGNOSIS — R29707 NIHSS score 7: Secondary | ICD-10-CM | POA: Diagnosis not present

## 2017-07-01 DIAGNOSIS — Z9981 Dependence on supplemental oxygen: Secondary | ICD-10-CM | POA: Diagnosis not present

## 2017-07-01 DIAGNOSIS — I447 Left bundle-branch block, unspecified: Secondary | ICD-10-CM | POA: Diagnosis present

## 2017-07-01 DIAGNOSIS — J449 Chronic obstructive pulmonary disease, unspecified: Secondary | ICD-10-CM | POA: Diagnosis present

## 2017-07-01 DIAGNOSIS — I6521 Occlusion and stenosis of right carotid artery: Secondary | ICD-10-CM | POA: Diagnosis present

## 2017-07-01 DIAGNOSIS — I361 Nonrheumatic tricuspid (valve) insufficiency: Secondary | ICD-10-CM | POA: Diagnosis not present

## 2017-07-01 DIAGNOSIS — Z833 Family history of diabetes mellitus: Secondary | ICD-10-CM

## 2017-07-01 DIAGNOSIS — F419 Anxiety disorder, unspecified: Secondary | ICD-10-CM | POA: Diagnosis present

## 2017-07-01 DIAGNOSIS — M1711 Unilateral primary osteoarthritis, right knee: Secondary | ICD-10-CM | POA: Diagnosis present

## 2017-07-01 DIAGNOSIS — K219 Gastro-esophageal reflux disease without esophagitis: Secondary | ICD-10-CM | POA: Diagnosis present

## 2017-07-01 DIAGNOSIS — J9811 Atelectasis: Secondary | ICD-10-CM

## 2017-07-01 DIAGNOSIS — Z8249 Family history of ischemic heart disease and other diseases of the circulatory system: Secondary | ICD-10-CM

## 2017-07-01 HISTORY — PX: ENDARTERECTOMY: SHX5162

## 2017-07-01 HISTORY — PX: PATCH ANGIOPLASTY: SHX6230

## 2017-07-01 LAB — GLUCOSE, CAPILLARY
GLUCOSE-CAPILLARY: 159 mg/dL — AB (ref 65–99)
Glucose-Capillary: 155 mg/dL — ABNORMAL HIGH (ref 65–99)
Glucose-Capillary: 108 mg/dL — ABNORMAL HIGH (ref 65–99)
Glucose-Capillary: 188 mg/dL — ABNORMAL HIGH (ref 65–99)
Glucose-Capillary: 205 mg/dL — ABNORMAL HIGH (ref 65–99)

## 2017-07-01 LAB — APTT: aPTT: 32 seconds (ref 24–36)

## 2017-07-01 LAB — URINALYSIS, ROUTINE W REFLEX MICROSCOPIC
BILIRUBIN URINE: NEGATIVE
GLUCOSE, UA: NEGATIVE mg/dL
Hgb urine dipstick: NEGATIVE
KETONES UR: NEGATIVE mg/dL
Nitrite: NEGATIVE
PH: 6 (ref 5.0–8.0)
PROTEIN: 30 mg/dL — AB
Specific Gravity, Urine: 1.018 (ref 1.005–1.030)

## 2017-07-01 LAB — CBC
HCT: 26 % — ABNORMAL LOW (ref 36.0–46.0)
Hemoglobin: 7.3 g/dL — ABNORMAL LOW (ref 12.0–15.0)
MCH: 19 pg — AB (ref 26.0–34.0)
MCHC: 28.1 g/dL — AB (ref 30.0–36.0)
MCV: 67.7 fL — AB (ref 78.0–100.0)
PLATELETS: 265 10*3/uL (ref 150–400)
RBC: 3.84 MIL/uL — ABNORMAL LOW (ref 3.87–5.11)
RDW: 20.2 % — AB (ref 11.5–15.5)
WBC: 7.4 10*3/uL (ref 4.0–10.5)

## 2017-07-01 LAB — COMPREHENSIVE METABOLIC PANEL
ALT: 12 U/L — ABNORMAL LOW (ref 14–54)
ANION GAP: 16 — AB (ref 5–15)
AST: 29 U/L (ref 15–41)
Albumin: 3.8 g/dL (ref 3.5–5.0)
Alkaline Phosphatase: 93 U/L (ref 38–126)
BILIRUBIN TOTAL: 0.6 mg/dL (ref 0.3–1.2)
BUN: 18 mg/dL (ref 6–20)
CHLORIDE: 103 mmol/L (ref 101–111)
CO2: 23 mmol/L (ref 22–32)
Calcium: 9 mg/dL (ref 8.9–10.3)
Creatinine, Ser: 0.95 mg/dL (ref 0.44–1.00)
GFR calc non Af Amer: >60 mL/min (ref >60)
Glucose, Bld: 152 mg/dL — ABNORMAL HIGH (ref 65–99)
POTASSIUM: 3.2 mmol/L — AB (ref 3.5–5.1)
Sodium: 142 mmol/L (ref 135–145)
TOTAL PROTEIN: 7.6 g/dL (ref 6.5–8.1)

## 2017-07-01 LAB — SURGICAL PCR SCREEN
MRSA, PCR: NEGATIVE
Staphylococcus aureus: POSITIVE — AB

## 2017-07-01 LAB — POCT ACTIVATED CLOTTING TIME: ACTIVATED CLOTTING TIME: 169 s

## 2017-07-01 LAB — PROTIME-INR
INR: 1.13
Prothrombin Time: 14.4 seconds (ref 11.4–15.2)

## 2017-07-01 SURGERY — ENDARTERECTOMY, CAROTID
Anesthesia: General | Site: Neck | Laterality: Right

## 2017-07-01 MED ORDER — METFORMIN HCL 500 MG PO TABS
1000.0000 mg | ORAL_TABLET | Freq: Two times a day (BID) | ORAL | Status: DC
  Administered 2017-07-02 – 2017-07-03 (×2): 1000 mg via ORAL
  Filled 2017-07-01 (×3): qty 2

## 2017-07-01 MED ORDER — MIDAZOLAM HCL 5 MG/5ML IJ SOLN
INTRAMUSCULAR | Status: DC | PRN
  Administered 2017-07-01: 0.5 mg via INTRAVENOUS
  Administered 2017-07-01: 1 mg via INTRAVENOUS
  Administered 2017-07-01: 0.5 mg via INTRAVENOUS

## 2017-07-01 MED ORDER — CHLORHEXIDINE GLUCONATE 4 % EX LIQD: 60.0000 mL | Freq: Once | CUTANEOUS | Status: DC

## 2017-07-01 MED ORDER — OXYCODONE HCL 5 MG/5ML PO SOLN: 5.0000 mg | Freq: Once | ORAL | Status: DC | PRN

## 2017-07-01 MED ORDER — ACETAMINOPHEN 325 MG PO TABS: 325.0000 mg | ORAL_TABLET | ORAL | Status: DC | PRN

## 2017-07-01 MED ORDER — PROPOFOL 10 MG/ML IV BOLUS
INTRAVENOUS | Status: AC
  Filled 2017-07-01: qty 20

## 2017-07-01 MED ORDER — OXYCODONE-ACETAMINOPHEN 5-325 MG PO TABS
1.0000 | ORAL_TABLET | Freq: Four times a day (QID) | ORAL | Status: DC | PRN
  Administered 2017-07-01: 2 via ORAL
  Administered 2017-07-02 – 2017-07-03 (×2): 1 via ORAL
  Filled 2017-07-01 (×2): qty 1
  Filled 2017-07-01: qty 2
  Filled 2017-07-01: qty 1
  Filled 2017-07-01: qty 2

## 2017-07-01 MED ORDER — HYDROMORPHONE HCL 2 MG/ML IJ SOLN
0.2500 mg | INTRAMUSCULAR | Status: DC | PRN
  Administered 2017-07-01 (×2): 0.25 mg via INTRAVENOUS

## 2017-07-01 MED ORDER — DEXAMETHASONE SODIUM PHOSPHATE 10 MG/ML IJ SOLN
INTRAMUSCULAR | Status: AC
  Filled 2017-07-01: qty 1

## 2017-07-01 MED ORDER — SODIUM CHLORIDE 0.9 % IV SOLN: 500.0000 mL | Freq: Once | INTRAVENOUS | Status: DC | PRN

## 2017-07-01 MED ORDER — DEXTROSE 5 % IV SOLN
INTRAVENOUS | Status: DC | PRN
  Administered 2017-07-01: 40 ug/min via INTRAVENOUS

## 2017-07-01 MED ORDER — ALUM & MAG HYDROXIDE-SIMETH 200-200-20 MG/5ML PO SUSP: 15.0000 mL | ORAL | Status: DC | PRN

## 2017-07-01 MED ORDER — CEFAZOLIN SODIUM-DEXTROSE 2-4 GM/100ML-% IV SOLN
2.0000 g | INTRAVENOUS | Status: DC
  Filled 2017-07-01: qty 100

## 2017-07-01 MED ORDER — OXYCODONE HCL 5 MG PO TABS: 5.0000 mg | ORAL_TABLET | Freq: Once | ORAL | Status: DC | PRN

## 2017-07-01 MED ORDER — SIMVASTATIN 40 MG PO TABS
40.0000 mg | ORAL_TABLET | Freq: Every day | ORAL | Status: DC
  Administered 2017-07-01 – 2017-07-02 (×2): 40 mg via ORAL
  Filled 2017-07-01 (×2): qty 1

## 2017-07-01 MED ORDER — LIDOCAINE 2% (20 MG/ML) 5 ML SYRINGE
INTRAMUSCULAR | Status: AC
  Filled 2017-07-01: qty 5

## 2017-07-01 MED ORDER — ONDANSETRON HCL 4 MG/2ML IJ SOLN
INTRAMUSCULAR | Status: AC
  Filled 2017-07-01: qty 2

## 2017-07-01 MED ORDER — MAGNESIUM SULFATE 2 GM/50ML IV SOLN: 2.0000 g | Freq: Every day | INTRAVENOUS | Status: DC | PRN

## 2017-07-01 MED ORDER — METOPROLOL TARTRATE 5 MG/5ML IV SOLN: 2.0000 mg | INTRAVENOUS | Status: DC | PRN

## 2017-07-01 MED ORDER — ASPIRIN EC 81 MG PO TBEC
81.0000 mg | DELAYED_RELEASE_TABLET | Freq: Every day | ORAL | Status: DC
  Administered 2017-07-02 – 2017-07-03 (×2): 81 mg via ORAL
  Filled 2017-07-01 (×2): qty 1

## 2017-07-01 MED ORDER — POLYETHYLENE GLYCOL 3350 17 G PO PACK: 17.0000 g | PACK | Freq: Every day | ORAL | Status: DC | PRN

## 2017-07-01 MED ORDER — LIDOCAINE HCL (PF) 1 % IJ SOLN
INTRAMUSCULAR | Status: AC
  Filled 2017-07-01: qty 30

## 2017-07-01 MED ORDER — MUPIROCIN 2 % EX OINT
TOPICAL_OINTMENT | CUTANEOUS | Status: AC
  Administered 2017-07-01: 1 via TOPICAL
  Filled 2017-07-01: qty 22

## 2017-07-01 MED ORDER — LABETALOL HCL 5 MG/ML IV SOLN: 10.0000 mg | INTRAVENOUS | Status: DC | PRN

## 2017-07-01 MED ORDER — ALBUTEROL SULFATE (2.5 MG/3ML) 0.083% IN NEBU: 2.5000 mg | INHALATION_SOLUTION | Freq: Four times a day (QID) | RESPIRATORY_TRACT | Status: DC | PRN

## 2017-07-01 MED ORDER — HEMOSTATIC AGENTS (NO CHARGE) OPTIME
TOPICAL | Status: DC | PRN
  Administered 2017-07-01: 1 via TOPICAL

## 2017-07-01 MED ORDER — HYDROMORPHONE HCL 2 MG/ML IJ SOLN
INTRAMUSCULAR | Status: AC
Start: 2017-07-01 — End: 2017-07-02
  Filled 2017-07-01: qty 1

## 2017-07-01 MED ORDER — PROMETHAZINE HCL 25 MG/ML IJ SOLN: 6.2500 mg | INTRAMUSCULAR | Status: DC | PRN

## 2017-07-01 MED ORDER — ESMOLOL HCL 100 MG/10ML IV SOLN
INTRAVENOUS | Status: AC
  Filled 2017-07-01: qty 10

## 2017-07-01 MED ORDER — VITAMIN D 1000 UNITS PO TABS
1000.0000 [IU] | ORAL_TABLET | Freq: Every day | ORAL | Status: DC
  Administered 2017-07-02 – 2017-07-03 (×2): 1000 [IU] via ORAL
  Filled 2017-07-01 (×2): qty 1

## 2017-07-01 MED ORDER — DONEPEZIL HCL 5 MG PO TABS
5.0000 mg | ORAL_TABLET | Freq: Every day | ORAL | Status: DC
  Administered 2017-07-01 – 2017-07-02 (×2): 5 mg via ORAL
  Filled 2017-07-01 (×2): qty 1

## 2017-07-01 MED ORDER — VASOPRESSIN 20 UNIT/ML IV SOLN
INTRAVENOUS | Status: AC
  Filled 2017-07-01: qty 1

## 2017-07-01 MED ORDER — HYDRALAZINE HCL 20 MG/ML IJ SOLN: 5.0000 mg | INTRAMUSCULAR | Status: DC | PRN

## 2017-07-01 MED ORDER — SODIUM CHLORIDE 0.9 % IV SOLN
INTRAVENOUS | Status: DC
  Administered 2017-07-01: 14:00:00 via INTRAVENOUS

## 2017-07-01 MED ORDER — SODIUM CHLORIDE 0.9 % IV SOLN
INTRAVENOUS | Status: DC | PRN
  Administered 2017-07-01: 500 mL

## 2017-07-01 MED ORDER — MELOXICAM 7.5 MG PO TABS
7.5000 mg | ORAL_TABLET | Freq: Every day | ORAL | Status: DC
  Administered 2017-07-01 – 2017-07-03 (×3): 7.5 mg via ORAL
  Filled 2017-07-01 (×3): qty 1

## 2017-07-01 MED ORDER — SODIUM CHLORIDE 0.9 % IV SOLN
INTRAVENOUS | Status: DC
  Administered 2017-07-01: 18:00:00 via INTRAVENOUS

## 2017-07-01 MED ORDER — ROCURONIUM BROMIDE 100 MG/10ML IV SOLN
INTRAVENOUS | Status: DC | PRN
  Administered 2017-07-01: 40 mg via INTRAVENOUS
  Administered 2017-07-01: 20 mg via INTRAVENOUS
  Administered 2017-07-01: 10 mg via INTRAVENOUS

## 2017-07-01 MED ORDER — ROCURONIUM BROMIDE 50 MG/5ML IV SOLN
INTRAVENOUS | Status: AC
  Filled 2017-07-01: qty 1

## 2017-07-01 MED ORDER — EPHEDRINE SULFATE 50 MG/ML IJ SOLN
INTRAMUSCULAR | Status: DC | PRN
  Administered 2017-07-01: 5 mg via INTRAVENOUS
  Administered 2017-07-01: 10 mg via INTRAVENOUS

## 2017-07-01 MED ORDER — LEVOCETIRIZINE DIHYDROCHLORIDE 5 MG PO TABS: 5.0000 mg | ORAL_TABLET | Freq: Every evening | ORAL | Status: DC

## 2017-07-01 MED ORDER — LAMOTRIGINE 25 MG PO TABS
200.0000 mg | ORAL_TABLET | Freq: Two times a day (BID) | ORAL | Status: DC
  Administered 2017-07-01 – 2017-07-03 (×3): 200 mg via ORAL
  Filled 2017-07-01 (×3): qty 8

## 2017-07-01 MED ORDER — VENLAFAXINE HCL ER 37.5 MG PO CP24
37.5000 mg | ORAL_CAPSULE | Freq: Every day | ORAL | Status: DC
  Administered 2017-07-03: 37.5 mg via ORAL
  Filled 2017-07-01 (×2): qty 1

## 2017-07-01 MED ORDER — CEFAZOLIN SODIUM-DEXTROSE 2-4 GM/100ML-% IV SOLN
2.0000 g | Freq: Three times a day (TID) | INTRAVENOUS | Status: AC
  Administered 2017-07-01 – 2017-07-02 (×2): 2 g via INTRAVENOUS
  Filled 2017-07-01 (×2): qty 100

## 2017-07-01 MED ORDER — BISACODYL 10 MG RE SUPP: 10.0000 mg | Freq: Every day | RECTAL | Status: DC | PRN

## 2017-07-01 MED ORDER — MORPHINE SULFATE (PF) 2 MG/ML IV SOLN: 2.0000 mg | INTRAVENOUS | Status: DC | PRN

## 2017-07-01 MED ORDER — HEPARIN SODIUM (PORCINE) 1000 UNIT/ML IJ SOLN
INTRAMUSCULAR | Status: DC | PRN
  Administered 2017-07-01: 2000 [IU] via INTRAVENOUS
  Administered 2017-07-01: 8000 [IU] via INTRAVENOUS

## 2017-07-01 MED ORDER — FUROSEMIDE 20 MG PO TABS
20.0000 mg | ORAL_TABLET | Freq: Two times a day (BID) | ORAL | Status: DC
  Administered 2017-07-01 – 2017-07-03 (×3): 20 mg via ORAL
  Filled 2017-07-01 (×4): qty 1

## 2017-07-01 MED ORDER — PANTOPRAZOLE SODIUM 40 MG PO TBEC
40.0000 mg | DELAYED_RELEASE_TABLET | Freq: Every day | ORAL | Status: DC
  Administered 2017-07-02 – 2017-07-03 (×2): 40 mg via ORAL
  Filled 2017-07-01 (×2): qty 1

## 2017-07-01 MED ORDER — SUGAMMADEX SODIUM 200 MG/2ML IV SOLN
INTRAVENOUS | Status: AC
  Filled 2017-07-01: qty 2

## 2017-07-01 MED ORDER — POTASSIUM CHLORIDE CRYS ER 20 MEQ PO TBCR
20.0000 meq | EXTENDED_RELEASE_TABLET | Freq: Every day | ORAL | Status: AC | PRN
  Administered 2017-07-01: 40 meq via ORAL
  Filled 2017-07-01: qty 2

## 2017-07-01 MED ORDER — 0.9 % SODIUM CHLORIDE (POUR BTL) OPTIME
TOPICAL | Status: DC | PRN
  Administered 2017-07-01: 2000 mL

## 2017-07-01 MED ORDER — PHENOL 1.4 % MT LIQD: 1.0000 | OROMUCOSAL | Status: DC | PRN

## 2017-07-01 MED ORDER — LORATADINE 10 MG PO TABS
10.0000 mg | ORAL_TABLET | Freq: Every day | ORAL | Status: DC
  Administered 2017-07-02 – 2017-07-03 (×2): 10 mg via ORAL
  Filled 2017-07-01 (×2): qty 1

## 2017-07-01 MED ORDER — ONDANSETRON HCL 4 MG/2ML IJ SOLN: 4.0000 mg | Freq: Four times a day (QID) | INTRAMUSCULAR | Status: DC | PRN

## 2017-07-01 MED ORDER — DOCUSATE SODIUM 100 MG PO CAPS
100.0000 mg | ORAL_CAPSULE | Freq: Every day | ORAL | Status: DC
  Administered 2017-07-02 – 2017-07-03 (×2): 100 mg via ORAL
  Filled 2017-07-01 (×2): qty 1

## 2017-07-01 MED ORDER — GUAIFENESIN-DM 100-10 MG/5ML PO SYRP: 15.0000 mL | ORAL_SOLUTION | ORAL | Status: DC | PRN

## 2017-07-01 MED ORDER — BUSPIRONE HCL 15 MG PO TABS
30.0000 mg | ORAL_TABLET | Freq: Two times a day (BID) | ORAL | Status: DC
  Administered 2017-07-01 – 2017-07-03 (×4): 30 mg via ORAL
  Filled 2017-07-01 (×4): qty 2

## 2017-07-01 MED ORDER — SUGAMMADEX SODIUM 200 MG/2ML IV SOLN
INTRAVENOUS | Status: DC | PRN
  Administered 2017-07-01: 150 mg via INTRAVENOUS

## 2017-07-01 MED ORDER — VENLAFAXINE HCL ER 75 MG PO CP24
150.0000 mg | ORAL_CAPSULE | Freq: Every day | ORAL | Status: DC
  Administered 2017-07-03: 150 mg via ORAL
  Filled 2017-07-01 (×2): qty 2

## 2017-07-01 MED ORDER — LACTATED RINGERS IV SOLN
INTRAVENOUS | Status: DC | PRN
  Administered 2017-07-01 (×2): via INTRAVENOUS

## 2017-07-01 MED ORDER — SODIUM CHLORIDE 0.9 % IV SOLN
0.0125 ug/kg/min | INTRAVENOUS | Status: AC
  Administered 2017-07-01: .2 ug/kg/min via INTRAVENOUS
  Filled 2017-07-01: qty 2000

## 2017-07-01 MED ORDER — ACYCLOVIR 800 MG PO TABS
800.0000 mg | ORAL_TABLET | Freq: Every day | ORAL | Status: DC
  Administered 2017-07-02 – 2017-07-03 (×2): 800 mg via ORAL
  Filled 2017-07-01 (×2): qty 1

## 2017-07-01 MED ORDER — ALBUMIN HUMAN 5 % IV SOLN
INTRAVENOUS | Status: DC | PRN
  Administered 2017-07-01 (×3): via INTRAVENOUS

## 2017-07-01 MED ORDER — MUPIROCIN 2 % EX OINT
1.0000 "application " | TOPICAL_OINTMENT | Freq: Once | CUTANEOUS | Status: AC
  Administered 2017-07-01: 1 via TOPICAL

## 2017-07-01 MED ORDER — ESMOLOL HCL 100 MG/10ML IV SOLN
50.0000 ug/kg/min | Freq: Once | INTRAVENOUS | Status: AC
  Administered 2017-07-01: 40 mg via INTRAVENOUS
  Filled 2017-07-01: qty 10

## 2017-07-01 MED ORDER — PHENYLEPHRINE 40 MCG/ML (10ML) SYRINGE FOR IV PUSH (FOR BLOOD PRESSURE SUPPORT)
PREFILLED_SYRINGE | INTRAVENOUS | Status: AC
  Filled 2017-07-01: qty 10

## 2017-07-01 MED ORDER — ACETAMINOPHEN 325 MG RE SUPP: 325.0000 mg | RECTAL | Status: DC | PRN

## 2017-07-01 MED ORDER — SODIUM CHLORIDE 0.9 % IV SOLN
INTRAVENOUS | Status: AC
  Filled 2017-07-01: qty 1.2

## 2017-07-01 MED ORDER — INSULIN ASPART 100 UNIT/ML ~~LOC~~ SOLN
0.0000 [IU] | Freq: Three times a day (TID) | SUBCUTANEOUS | Status: DC
  Administered 2017-07-02 – 2017-07-03 (×3): 3 [IU] via SUBCUTANEOUS

## 2017-07-01 MED ORDER — MEDICAL COMPRESSION STOCKINGS MISC: 2.0000 | Freq: Every day | Status: DC

## 2017-07-01 MED ORDER — AMITRIPTYLINE HCL 50 MG PO TABS
50.0000 mg | ORAL_TABLET | Freq: Every day | ORAL | Status: DC
  Administered 2017-07-01 – 2017-07-02 (×2): 50 mg via ORAL
  Filled 2017-07-01 (×2): qty 1

## 2017-07-01 MED ORDER — CLOPIDOGREL BISULFATE 75 MG PO TABS: 75.0000 mg | ORAL_TABLET | Freq: Every day | ORAL | Status: DC

## 2017-07-01 MED ORDER — VASOPRESSIN 20 UNIT/ML IV SOLN
INTRAVENOUS | Status: DC | PRN
  Administered 2017-07-01: 10 [IU] via INTRAVENOUS
  Administered 2017-07-01 (×2): 1 [IU] via INTRAVENOUS
  Administered 2017-07-01 (×2): 2 [IU] via INTRAVENOUS
  Administered 2017-07-01: 1 [IU] via INTRAVENOUS
  Administered 2017-07-01: 3 [IU] via INTRAVENOUS

## 2017-07-01 MED ORDER — VASOPRESSIN 20 UNIT/ML IV SOLN
0.0300 [IU]/min | INTRAVENOUS | Status: DC
  Administered 2017-07-01: .03 [IU]/min via INTRAVENOUS
  Filled 2017-07-01: qty 2

## 2017-07-01 MED ORDER — PROTAMINE SULFATE 10 MG/ML IV SOLN
INTRAVENOUS | Status: DC | PRN
  Administered 2017-07-01: 50 mg via INTRAVENOUS

## 2017-07-01 MED ORDER — GABAPENTIN 100 MG PO CAPS
100.0000 mg | ORAL_CAPSULE | Freq: Three times a day (TID) | ORAL | Status: DC
  Administered 2017-07-01 – 2017-07-03 (×7): 100 mg via ORAL
  Filled 2017-07-01 (×7): qty 1

## 2017-07-01 MED ORDER — SODIUM CHLORIDE 0.9 % IJ SOLN
INTRAMUSCULAR | Status: AC
  Filled 2017-07-01: qty 20

## 2017-07-01 SURGICAL SUPPLY — 47 items
ADH SKN CLS APL DERMABOND .7 (GAUZE/BANDAGES/DRESSINGS) ×1
CANISTER SUCT 3000ML PPV (MISCELLANEOUS) ×3 IMPLANT
CATH ROBINSON RED A/P 18FR (CATHETERS) ×3 IMPLANT
CATH SUCT 10FR WHISTLE TIP (CATHETERS) ×3 IMPLANT
CLIP VESOCCLUDE MED 6/CT (CLIP) ×3 IMPLANT
CLIP VESOCCLUDE SM WIDE 6/CT (CLIP) ×3 IMPLANT
COVER PROBE W GEL 5X96 (DRAPES) IMPLANT
CRADLE DONUT ADULT HEAD (MISCELLANEOUS) ×3 IMPLANT
DERMABOND ADVANCED (GAUZE/BANDAGES/DRESSINGS) ×2
DERMABOND ADVANCED .7 DNX12 (GAUZE/BANDAGES/DRESSINGS) ×1 IMPLANT
DRAIN CHANNEL 15F RND FF W/TCR (WOUND CARE) ×2 IMPLANT
ELECT REM PT RETURN 9FT ADLT (ELECTROSURGICAL) ×3
ELECTRODE REM PT RTRN 9FT ADLT (ELECTROSURGICAL) ×1 IMPLANT
EVACUATOR SILICONE 100CC (DRAIN) ×2 IMPLANT
GLOVE BIOGEL PI IND STRL 7.5 (GLOVE) ×1 IMPLANT
GLOVE BIOGEL PI INDICATOR 7.5 (GLOVE) ×2
GLOVE SURG SS PI 7.5 STRL IVOR (GLOVE) ×3 IMPLANT
GOWN STRL REUS W/ TWL LRG LVL3 (GOWN DISPOSABLE) ×2 IMPLANT
GOWN STRL REUS W/ TWL XL LVL3 (GOWN DISPOSABLE) ×1 IMPLANT
GOWN STRL REUS W/TWL LRG LVL3 (GOWN DISPOSABLE) ×6
GOWN STRL REUS W/TWL XL LVL3 (GOWN DISPOSABLE) ×3
HEMOSTAT SNOW SURGICEL 2X4 (HEMOSTASIS) ×2 IMPLANT
INSERT FOGARTY SM (MISCELLANEOUS) IMPLANT
KIT BASIN OR (CUSTOM PROCEDURE TRAY) ×3 IMPLANT
KIT SHUNT ARGYLE CAROTID ART 6 (VASCULAR PRODUCTS) ×2 IMPLANT
KIT TURNOVER KIT B (KITS) ×3 IMPLANT
NDL HYPO 25GX1X1/2 BEV (NEEDLE) IMPLANT
NEEDLE HYPO 25GX1X1/2 BEV (NEEDLE) IMPLANT
NS IRRIG 1000ML POUR BTL (IV SOLUTION) ×9 IMPLANT
PACK CAROTID (CUSTOM PROCEDURE TRAY) ×3 IMPLANT
PAD ARMBOARD 7.5X6 YLW CONV (MISCELLANEOUS) ×6 IMPLANT
PATCH VASC XENOSURE 1CMX6CM (Vascular Products) ×3 IMPLANT
PATCH VASC XENOSURE 1X6 (Vascular Products) IMPLANT
SHUNT CAROTID BYPASS 10 (VASCULAR PRODUCTS) IMPLANT
SHUNT CAROTID BYPASS 12FRX15.5 (VASCULAR PRODUCTS) IMPLANT
SUT ETHILON 3 0 PS 1 (SUTURE) ×2 IMPLANT
SUT PROLENE 6 0 BV (SUTURE) ×7 IMPLANT
SUT PROLENE 7 0 BV 1 (SUTURE) IMPLANT
SUT PROLENE 7 0 BV1 MDA (SUTURE) ×2 IMPLANT
SUT SILK 3 0 (SUTURE)
SUT SILK 3-0 18XBRD TIE 12 (SUTURE) IMPLANT
SUT VIC AB 3-0 SH 27 (SUTURE) ×6
SUT VIC AB 3-0 SH 27X BRD (SUTURE) ×2 IMPLANT
SUT VICRYL 4-0 PS2 18IN ABS (SUTURE) ×3 IMPLANT
SYR CONTROL 10ML LL (SYRINGE) IMPLANT
TOWEL GREEN STERILE (TOWEL DISPOSABLE) ×3 IMPLANT
WATER STERILE IRR 1000ML POUR (IV SOLUTION) ×3 IMPLANT

## 2017-07-01 NOTE — Discharge Instructions (Signed)
° °  [facility]ascular and Vein Specialists of [facility]  Discharge Instructions   Carotid Endarterectomy (CEA)  Please refer to the following instructions for your post-procedure care. Your surgeon or physician assistant will discuss any changes with you.  Activity  You are encouraged to walk as much as you can. You can slowly return to normal activities but must avoid strenuous activity and heavy lifting until your doctor tell you it's okay. Avoid activities such as vacuuming or swinging a golf club. You can drive after one week if you are comfortable and you are no longer taking prescription pain medications. It is normal to feel tired for serval weeks after your surgery. It is also normal to have difficulty with sleep habits, eating, and bowel movements after surgery. These will go away with time.  Bathing/Showering  Shower daily after you go home. Do not soak in a bathtub, hot tub, or swim until the incision heals completely.  Incision Care  Shower every day. Clean your incision with mild soap and water. Pat the area dry with a clean towel. You do not need a bandage unless otherwise instructed. Do not apply any ointments or creams to your incision. You may have skin glue on your incision. Do not peel it off. It will come off on its own in about one week. Your incision may feel thickened and raised for several weeks after your surgery. This is normal and the skin will soften over time.   For Men Only: It's okay to shave around the incision but do not shave the incision itself for 2 weeks. It is common to have numbness under your chin that could last for several months.  Diet  Resume your normal diet. There are no special food restrictions following this procedure. A low fat/low cholesterol diet is recommended for all patients with vascular disease. In order to heal from your surgery, it is CRITICAL to get adequate nutrition. Your body requires vitamins, minerals, and protein. Vegetables are the  best source of vitamins and minerals. Vegetables also provide the perfect balance of protein. Processed food has little nutritional value, so try to avoid this.  Medications  Resume taking all of your medications unless your doctor or physician assistant tells you not to. If your incision is causing pain, you may take over-the- counter pain relievers such as acetaminophen (Tylenol). If you were prescribed a stronger pain medication, please be aware these medications can cause nausea and constipation. Prevent nausea by taking the medication with a snack or meal. Avoid constipation by drinking plenty of fluids and eating foods with a high amount of fiber, such as fruits, vegetables, and grains.  Do not take Tylenol if you are taking prescription pain medications.  Follow Up  Our office will schedule a follow up appointment 2-3 weeks following discharge.  Please call us immediately for any of the following conditions   Increased pain, redness, drainage (pus) from your incision site.  Fever of 101 degrees or higher.  If you should develop stroke (slurred speech, difficulty swallowing, weakness on one side of your body, loss of vision) you should call 911 and go to the nearest emergency room.   Reduce your risk of vascular disease:   Stop smoking. If you would like help call [facility] at 1-800-QUIT-NOW (609)758-1266) or [facility] at [phone].  Manage your cholesterol  Maintain a desired weight  Control your diabetes  Keep your blood pressure down   If you have any questions, please call the office at [phone].

## 2017-07-01 NOTE — Interval H&P Note (Signed)
History and Physical Interval Note:  07/01/2017 10:32 AM  Carol Richmond  has presented today for surgery, with the diagnosis of RIGHT CAROTID STENOSIS  The various methods of treatment have been discussed with the patient and family. After consideration of risks, benefits and other options for treatment, the patient has consented to  Procedure(s): ENDARTERECTOMY CAROTID RIGHT (Right) as a surgical intervention .  The patient's history has been reviewed, patient examined, no change in status, stable for surgery.  I have reviewed the patient's chart and labs.  Questions were answered to the patient's satisfaction.     Annamarie Major

## 2017-07-01 NOTE — Anesthesia Preprocedure Evaluation (Addendum)
Anesthesia Evaluation  Patient identified by MRN, date of birth, ID band Patient awake    Reviewed: Allergy & Precautions, NPO status , Patient's Chart, lab work & pertinent test results, reviewed documented beta blocker date and time   Airway Mallampati: III  TM Distance: >3 FB Neck ROM: Full    Dental  (+) Upper Dentures, Lower Dentures   Pulmonary asthma , sleep apnea ,  On home O2   Pulmonary exam normal breath sounds clear to auscultation       Cardiovascular hypertension, Pt. on medications and Pt. on home beta blockers Normal cardiovascular exam Rhythm:Regular Rate:Normal  ECG: NSR, LBBB, rate 97  ECHO: Normal LV wall thickness with LVEF 60-65% and grade 1 diastolic dysfunction. Moderate left atrial enlargement. Thickened mitral leaflets with evidence of mild mitral stenosis and mild to moderate mitral regurgitation made up of two separate jets. Normal right ventricular contraction. Trivial tricuspid regurgitation with normal estimated PASP. Prominent pericardial fat pad.    Neuro/Psych  Headaches, Seizures -, Well Controlled,  PSYCHIATRIC DISORDERS Anxiety Depression TIA   GI/Hepatic Neg liver ROS, GERD  Medicated and Controlled,IBS (irritable bowel syndrome)   Endo/Other  diabetes, Oral Hypoglycemic Agents  Renal/GU negative Renal ROS     Musculoskeletal negative musculoskeletal ROS (+)   Abdominal   Peds  Hematology  (+) anemia , HLD   Anesthesia Other Findings RIGHT CAROTID STENOSIS  Reproductive/Obstetrics                            Anesthesia Physical Anesthesia Plan  ASA: IV  Anesthesia Plan: General   Post-op Pain Management:    Induction: Intravenous  PONV Risk Score and Plan: 3 and Ondansetron, Dexamethasone and Treatment may vary due to age or medical condition  Airway Management Planned: Oral ETT  Additional Equipment: Arterial line  Intra-op Plan:    Post-operative Plan: Extubation in OR  Informed Consent: I have reviewed the patients History and Physical, chart, labs and discussed the procedure including the risks, benefits and alternatives for the proposed anesthesia with the patient or authorized representative who has indicated his/her understanding and acceptance.   Dental advisory given  Plan Discussed with: CRNA  Anesthesia Plan Comments:         Anesthesia Quick Evaluation

## 2017-07-01 NOTE — Anesthesia Procedure Notes (Signed)
Procedure Name: Intubation Date/Time: 07/01/2017 12:44 PM Performed by: Izora Gala, CRNA Pre-anesthesia Checklist: Patient identified and Emergency Drugs available Patient Re-evaluated:Patient Re-evaluated prior to induction Oxygen Delivery Method: Circle system utilized Preoxygenation: Pre-oxygenation with 100% oxygen Induction Type: IV induction Ventilation: Mask ventilation without difficulty Laryngoscope Size: Miller and 3 Grade View: Grade I Tube size: 7.0 mm Number of attempts: 1 Airway Equipment and Method: Stylet Placement Confirmation: ETT inserted through vocal cords under direct vision,  positive ETCO2 and breath sounds checked- equal and bilateral Secured at: 23 cm Tube secured with: Tape Dental Injury: Teeth and Oropharynx as per pre-operative assessment

## 2017-07-01 NOTE — Transfer of Care (Signed)
Immediate Anesthesia Transfer of Care Note  Patient: Carol Richmond  Procedure(s) Performed: ENDARTERECTOMY CAROTID RIGHT (Right Neck) RIGHT CAROTID PATCH ANGIOPLASTY (Right Neck)  Patient Location: PACU  Anesthesia Type:General  Level of Consciousness: awake, alert , oriented and patient cooperative  Airway & Oxygen Therapy: Patient Spontanous Breathing and Patient connected to nasal cannula oxygen  Post-op Assessment: Report given to RN, Post -op Vital signs reviewed and stable, Patient moving all extremities and Patient moving all extremities X 4  Post vital signs: Reviewed and stable  Last Vitals:  Vitals Value Taken Time  BP 84/53 07/01/2017  3:51 PM  Temp    Pulse 107 07/01/2017  3:57 PM  Resp 42 07/01/2017  3:57 PM  SpO2 98 % 07/01/2017  3:57 PM  Vitals shown include unvalidated device data.  Last Pain:  Vitals:   07/01/17 0957  TempSrc:   PainSc: 0-No pain      Patients Stated Pain Goal: 0 (67/[id number])  Complications: No apparent anesthesia complications

## 2017-07-01 NOTE — Progress Notes (Signed)
Pt received from PACU. VSS. Telemetry applied. CHG complete. L neck incision clean and intact with no hematoma. Pt oriented to unit and room. Call light in reach. Will continue to monitor.  Clyde Canterbury, RN

## 2017-07-01 NOTE — Anesthesia Procedure Notes (Signed)
Arterial Line Insertion Start/End5/14/2019 11:45 AM, 07/01/2017 12:02 PM Performed by: Josephine Igo, CRNA, CRNA  Patient location: Pre-op. Preanesthetic checklist: patient identified, IV checked, risks and benefits discussed, surgical consent and pre-op evaluation Lidocaine 1% used for infiltration Left, radial was placed Catheter size: 20 G Hand hygiene performed , maximum sterile barriers used  and Seldinger technique used  Attempts: 1 Procedure performed without using ultrasound guided technique. Following insertion, dressing applied and Biopatch. Post procedure assessment: normal  Patient tolerated the procedure well with no immediate complications.

## 2017-07-01 NOTE — Op Note (Signed)
Patient name: Carol Richmond MRN: [id number] DOB: [birth year] Sex: female  07/01/2017 Pre-operative Diagnosis: symptomatic   right carotid stenosis Post-operative diagnosis:  Same Surgeon:  Annamarie Major Assistants:  Leontine Locket Procedure:    right carotid Endarterectomy with bovine pericardial  patch angioplasty Anesthesia:  General Blood Loss:  100 cc Specimens:  none  Findings:  99 %stenosis; Thrombus:  none  Indications:  This is a 67 yo female who I saw in the office yesterday who was having multiple daily TIA's.  She had a CTA that showed a high grade right carotid stenosis.  We discussed proceeding with urgent CEA>  Procedure:  The patient was identified in the holding area and taken to [place] [facility]  The patient was then placed supine on the table.   General endotrachial anesthesia was administered.  The patient was prepped and draped in the usual sterile fashion.  A time out was called and antibiotics were administered.  The incision was made along the anterior border of the right sternocleidomastoid muscle.  Cautery was used to dissect through the subcutaneous tissue.  The platysma muscle was divided with cautery.  The internal jugular vein was exposed along its anterior medial border.  The common facial vein was exposed and then divided between 2-0 silk ties and metal clips.  The common carotid artery was then circumferentially exposed and encircled with an umbilical tape.  The vagus nerve was identified and protected.  Next sharp dissection was used to expose the external carotid artery and the superior thyroid artery.  The were encircled with a blue vessel loop and a 2-0 silk tie respectively.  Finally, the internal carotid was carefully dissected free.  An umbilical tape was placed around the internal carotid artery distal to the diseased segment.  The hypoglossal nerve was visualized throughout and protected.  The patient was given systemic heparinization.  A bovine carotid  patch was selected and prepared on the back table.  A 8 french shunt was also prepared.  After blood pressure readings were appropriate and the heparin had been given time to circulate, the internal carotid artery was occluded with a baby Gregory clamp.  The external and common carotid arteries were then occluded with vascular clamps and the 2-0 tie tightened on the superior thyroid artery.  A #11 blade was used to make an arteriotomy in the common carotid artery.  This was extended with Potts scissors along the anterior and lateral border of the common and internal carotid artery.  Approximately 99% stenosis was identified.  There was no thrombus identified.  The 8 french shunt was then placed.  A kleiner kuntz elevator was used to perform endarterectomy.  An eversion endarterectomy was performed in the external carotid artery.  A good distal endpoint was obtained in the internal carotid artery.  The specimen was removed and sent to pathology.  Heparinized saline was used to irrigate the endarterectomized field.  All potential embolic debris was removed.  Bovine pericardial patch angioplasty was then performed using a running 6-0 Prolene. Just prior to completion of the repair, the shunt was removed. The common internal and external carotid arteries were all appropriately flushed. The artery was again irrigated with heparin saline.  The anastomosis was then secured. The clamp was first released on the external carotid artery followed by the common carotid artery approximately 30 seconds later, bloodflow was reestablish through the internal carotid artery.  Next, a hand-held  Doppler was used to evaluate the signals in the  common, external, and internal  carotid arteries, all of which had appropriate signals. I then administered  50 mg protamine. The wound was then irrigated.  After hemostasis was achieved, the carotid sheath was reapproximated with 3-0 Vicryl. The  platysma muscle was reapproximated with  running 3-0 Vicryl. The skin  was closed with 4-0 Vicryl. Dermabond was placed on the skin. The  patient was then successfully extubated. Her neurologic exam was  similar to his preprocedural exam. The patient was then taken to recovery room  in stable condition. There were no complications.     Disposition:  To PACU in stable condition.  Relevant Operative Details:  Very small internal carotid artery for which I used a 8 [place] shunt.  Her ICA was nearly occluded.  I had difficulty finding a lumen and had to use the 11 blade to open the artery proximal and distal to the lesion.  We had trouble getting her systolic blood pressure above 130, with multiple pressors.  The hypoglossal nerve was mobilized in order to visualize the lesion.  A bovine patch was used.   I placed a 15 blake drain given she was on DAPT.  Theotis Burrow, M.D. [facility] Office: [phone] Pager:  [phone]

## 2017-07-01 NOTE — Progress Notes (Signed)
Post-op right CEA:  Following commands  Moving all extremities.   Tongue midline Minimal output in drain. Incision without hematoma   Annamarie Major

## 2017-07-02 ENCOUNTER — Encounter (HOSPITAL_COMMUNITY)
Admission: RE | Disposition: A | Discharge status: Home / Self Care | Payer: Self-pay | Source: Ambulatory Visit | Attending: Surgery

## 2017-07-02 ENCOUNTER — Inpatient Hospital Stay (HOSPITAL_COMMUNITY): Payer: PPO

## 2017-07-02 ENCOUNTER — Encounter (HOSPITAL_COMMUNITY): Payer: Self-pay | Admitting: Certified Registered"

## 2017-07-02 ENCOUNTER — Other Ambulatory Visit: Payer: Self-pay

## 2017-07-02 ENCOUNTER — Inpatient Hospital Stay (HOSPITAL_COMMUNITY): Payer: PPO | Admitting: Certified Registered"

## 2017-07-02 DIAGNOSIS — R531 Weakness: Secondary | ICD-10-CM | POA: Diagnosis not present

## 2017-07-02 DIAGNOSIS — I10 Essential (primary) hypertension: Secondary | ICD-10-CM | POA: Diagnosis not present

## 2017-07-02 DIAGNOSIS — E119 Type 2 diabetes mellitus without complications: Secondary | ICD-10-CM | POA: Diagnosis not present

## 2017-07-02 DIAGNOSIS — R4701 Aphasia: Secondary | ICD-10-CM | POA: Diagnosis not present

## 2017-07-02 DIAGNOSIS — I361 Nonrheumatic tricuspid (valve) insufficiency: Secondary | ICD-10-CM

## 2017-07-02 HISTORY — PX: WOUND EXPLORATION: SHX6188

## 2017-07-02 LAB — BPAM RBC
BLOOD PRODUCT EXPIRATION DATE: 201906032359
BLOOD PRODUCT EXPIRATION DATE: 201906042359
ISSUE DATE / TIME: 201905141348
ISSUE DATE / TIME: 201905141348
UNIT TYPE AND RH: 7300
UNIT TYPE AND RH: 7300

## 2017-07-02 LAB — BASIC METABOLIC PANEL
ANION GAP: 10 (ref 5–15)
BUN: 21 mg/dL — AB (ref 6–20)
CO2: 24 mmol/L (ref 22–32)
Calcium: 8.5 mg/dL — ABNORMAL LOW (ref 8.9–10.3)
Chloride: 104 mmol/L (ref 101–111)
Creatinine, Ser: 0.93 mg/dL (ref 0.44–1.00)
GFR calc Af Amer: >60 mL/min (ref >60)
GLUCOSE: 145 mg/dL — AB (ref 65–99)
POTASSIUM: 4.5 mmol/L (ref 3.5–5.1)
SODIUM: 138 mmol/L (ref 135–145)

## 2017-07-02 LAB — TYPE AND SCREEN
ABO/RH(D): B POS
Antibody Screen: NEGATIVE
Unit division: 0
Unit division: 0

## 2017-07-02 LAB — GLUCOSE, CAPILLARY
GLUCOSE-CAPILLARY: 169 mg/dL — AB (ref 65–99)
GLUCOSE-CAPILLARY: 161 mg/dL — AB (ref 65–99)
Glucose-Capillary: 183 mg/dL — ABNORMAL HIGH (ref 65–99)
Glucose-Capillary: 172 mg/dL — ABNORMAL HIGH (ref 65–99)
Glucose-Capillary: 186 mg/dL — ABNORMAL HIGH (ref 65–99)

## 2017-07-02 LAB — CBC
HCT: 26.3 % — ABNORMAL LOW (ref 36.0–46.0)
Hemoglobin: 7.8 g/dL — ABNORMAL LOW (ref 12.0–15.0)
MCH: 21.4 pg — AB (ref 26.0–34.0)
MCHC: 29.7 g/dL — ABNORMAL LOW (ref 30.0–36.0)
MCV: 72.3 fL — AB (ref 78.0–100.0)
PLATELETS: 218 10*3/uL (ref 150–400)
RBC: 3.64 MIL/uL — AB (ref 3.87–5.11)
RDW: 21 % — ABNORMAL HIGH (ref 11.5–15.5)
WBC: 9 10*3/uL (ref 4.0–10.5)

## 2017-07-02 LAB — ECHOCARDIOGRAM COMPLETE
Height: 64 in
Weight: 2656 oz

## 2017-07-02 SURGERY — WOUND EXPLORATION
Anesthesia: General | Laterality: Right

## 2017-07-02 MED ORDER — 0.9 % SODIUM CHLORIDE (POUR BTL) OPTIME
TOPICAL | Status: DC | PRN
  Administered 2017-07-02: 2000 mL

## 2017-07-02 MED ORDER — SUCCINYLCHOLINE 20MG/ML (10ML) SYRINGE FOR MEDFUSION PUMP - OPTIME
INTRAMUSCULAR | Status: DC | PRN
  Administered 2017-07-02: 100 mg via INTRAVENOUS

## 2017-07-02 MED ORDER — LIDOCAINE HCL (CARDIAC) PF 100 MG/5ML IV SOSY
PREFILLED_SYRINGE | INTRAVENOUS | Status: DC | PRN
  Administered 2017-07-02: 80 mg via INTRATRACHEAL

## 2017-07-02 MED ORDER — CLOPIDOGREL BISULFATE 75 MG PO TABS
75.0000 mg | ORAL_TABLET | Freq: Every day | ORAL | Status: DC
  Administered 2017-07-03: 75 mg via ORAL
  Filled 2017-07-02: qty 1

## 2017-07-02 MED ORDER — LIDOCAINE 2% (20 MG/ML) 5 ML SYRINGE
INTRAMUSCULAR | Status: AC
  Filled 2017-07-02: qty 5

## 2017-07-02 MED ORDER — IOPAMIDOL (ISOVUE-370) INJECTION 76%
50.0000 mL | Freq: Once | INTRAVENOUS | Status: AC | PRN
  Administered 2017-07-02: 50 mL via INTRAVENOUS

## 2017-07-02 MED ORDER — DEXAMETHASONE SODIUM PHOSPHATE 10 MG/ML IJ SOLN
INTRAMUSCULAR | Status: DC | PRN
  Administered 2017-07-02: 10 mg via INTRAVENOUS

## 2017-07-02 MED ORDER — ONDANSETRON HCL 4 MG/2ML IJ SOLN
INTRAMUSCULAR | Status: DC | PRN
  Administered 2017-07-02: 4 mg via INTRAVENOUS

## 2017-07-02 MED ORDER — ONDANSETRON HCL 4 MG/2ML IJ SOLN
INTRAMUSCULAR | Status: AC
  Filled 2017-07-02: qty 2

## 2017-07-02 MED ORDER — IOPAMIDOL (ISOVUE-370) INJECTION 76%
INTRAVENOUS | Status: AC
  Filled 2017-07-02: qty 50

## 2017-07-02 MED ORDER — FENTANYL CITRATE (PF) 250 MCG/5ML IJ SOLN
INTRAMUSCULAR | Status: DC | PRN
  Administered 2017-07-02: 50 ug via INTRAVENOUS

## 2017-07-02 MED ORDER — FENTANYL CITRATE (PF) 250 MCG/5ML IJ SOLN
INTRAMUSCULAR | Status: AC
Start: 2017-07-02 — End: ?
  Filled 2017-07-02: qty 5

## 2017-07-02 MED ORDER — PROPOFOL 10 MG/ML IV BOLUS
INTRAVENOUS | Status: DC | PRN
  Administered 2017-07-02: 100 mg via INTRAVENOUS

## 2017-07-02 MED ORDER — LACTATED RINGERS IV SOLN
INTRAVENOUS | Status: DC | PRN
  Administered 2017-07-02: 06:00:00 via INTRAVENOUS

## 2017-07-02 MED ORDER — DEXTRAN 40 IN D5W 10 % IV SOLN
INTRAVENOUS | Status: AC
  Filled 2017-07-02: qty 500

## 2017-07-02 MED ORDER — SODIUM CHLORIDE 0.9 % IV SOLN
INTRAVENOUS | Status: DC | PRN
  Administered 2017-07-02: 500 mL

## 2017-07-02 MED ORDER — PHENYLEPHRINE HCL 10 MG/ML IJ SOLN
INTRAVENOUS | Status: DC | PRN
  Administered 2017-07-02: 40 ug/min via INTRAVENOUS

## 2017-07-02 MED ORDER — DEXAMETHASONE SODIUM PHOSPHATE 10 MG/ML IJ SOLN
INTRAMUSCULAR | Status: AC
  Filled 2017-07-02: qty 1

## 2017-07-02 MED ORDER — CEFAZOLIN SODIUM-DEXTROSE 2-4 GM/100ML-% IV SOLN
2.0000 g | Freq: Three times a day (TID) | INTRAVENOUS | Status: AC
  Administered 2017-07-02 (×2): 2 g via INTRAVENOUS
  Filled 2017-07-02 (×2): qty 100

## 2017-07-02 MED ORDER — MIDAZOLAM HCL 2 MG/2ML IJ SOLN
INTRAMUSCULAR | Status: AC
  Filled 2017-07-02: qty 2

## 2017-07-02 MED ORDER — VASOPRESSIN 20 UNIT/ML IV SOLN
0.0100 [IU]/min | INTRAVENOUS | Status: DC
  Filled 2017-07-02: qty 2

## 2017-07-02 MED ORDER — PROPOFOL 10 MG/ML IV BOLUS
INTRAVENOUS | Status: AC
  Filled 2017-07-02: qty 20

## 2017-07-02 MED ORDER — HEPARIN (PORCINE) IN NACL 100-0.45 UNIT/ML-% IJ SOLN
600.0000 [IU]/h | INTRAMUSCULAR | Status: DC
  Administered 2017-07-02: 600 [IU]/h via INTRAVENOUS
  Filled 2017-07-02: qty 250

## 2017-07-02 MED ORDER — HEPARIN SODIUM (PORCINE) 5000 UNIT/ML IJ SOLN
INTRAMUSCULAR | Status: AC
  Filled 2017-07-02: qty 1.2

## 2017-07-02 SURGICAL SUPPLY — 47 items
ADH SKN CLS APL DERMABOND .7 (GAUZE/BANDAGES/DRESSINGS) ×1
BAG DECANTER FOR FLEXI CONT (MISCELLANEOUS) ×3 IMPLANT
CANISTER SUCT 3000ML PPV (MISCELLANEOUS) ×3 IMPLANT
CANNULA VESSEL 3MM 2 BLNT TIP (CANNULA) ×7 IMPLANT
CATH ROBINSON RED A/P 18FR (CATHETERS) IMPLANT
CLIP VESOCCLUDE MED 24/CT (CLIP) ×3 IMPLANT
CLIP VESOCCLUDE SM WIDE 24/CT (CLIP) ×3 IMPLANT
COVER PROBE W GEL 5X96 (DRAPES) ×2 IMPLANT
CRADLE DONUT ADULT HEAD (MISCELLANEOUS) ×3 IMPLANT
DERMABOND ADVANCED (GAUZE/BANDAGES/DRESSINGS) ×2
DERMABOND ADVANCED .7 DNX12 (GAUZE/BANDAGES/DRESSINGS) ×1 IMPLANT
DRAIN CHANNEL 15F RND FF W/TCR (WOUND CARE) IMPLANT
ELECT REM PT RETURN 9FT ADLT (ELECTROSURGICAL) ×3
ELECTRODE REM PT RTRN 9FT ADLT (ELECTROSURGICAL) ×1 IMPLANT
EVACUATOR SILICONE 100CC (DRAIN) IMPLANT
GEL ULTRASOUND 20GR AQUASONIC (MISCELLANEOUS) IMPLANT
GLOVE BIO SURGEON STRL SZ7 (GLOVE) ×2 IMPLANT
GLOVE BIO SURGEON STRL SZ7.5 (GLOVE) ×3 IMPLANT
GLOVE BIOGEL M 6.5 STRL (GLOVE) ×4 IMPLANT
GLOVE BIOGEL PI IND STRL 6.5 (GLOVE) IMPLANT
GLOVE BIOGEL PI IND STRL 7.5 (GLOVE) ×1 IMPLANT
GLOVE BIOGEL PI IND STRL 8 (GLOVE) ×1 IMPLANT
GLOVE BIOGEL PI INDICATOR 6.5 (GLOVE) ×2
GLOVE BIOGEL PI INDICATOR 7.5 (GLOVE) ×2
GLOVE BIOGEL PI INDICATOR 8 (GLOVE) ×2
GOWN STRL REUS W/ TWL LRG LVL3 (GOWN DISPOSABLE) ×3 IMPLANT
GOWN STRL REUS W/TWL LRG LVL3 (GOWN DISPOSABLE) ×9
KIT BASIN OR (CUSTOM PROCEDURE TRAY) ×3 IMPLANT
KIT SHUNT ARGYLE CAROTID ART 6 (VASCULAR PRODUCTS) IMPLANT
KIT TURNOVER KIT B (KITS) ×3 IMPLANT
NDL HYPO 25GX1X1/2 BEV (NEEDLE) ×1 IMPLANT
NEEDLE HYPO 25GX1X1/2 BEV (NEEDLE) ×3 IMPLANT
NS IRRIG 1000ML POUR BTL (IV SOLUTION) ×6 IMPLANT
PACK CAROTID (CUSTOM PROCEDURE TRAY) ×3 IMPLANT
PAD ARMBOARD 7.5X6 YLW CONV (MISCELLANEOUS) ×6 IMPLANT
SHUNT CAROTID BYPASS 10 (VASCULAR PRODUCTS) IMPLANT
SHUNT CAROTID BYPASS 12 (VASCULAR PRODUCTS) IMPLANT
SPONGE SURGIFOAM ABS GEL 100 (HEMOSTASIS) IMPLANT
SUT PROLENE 6 0 BV (SUTURE) ×3 IMPLANT
SUT SILK 2 0 PERMA HAND 18 BK (SUTURE) IMPLANT
SUT VIC AB 3-0 SH 27 (SUTURE) ×3
SUT VIC AB 3-0 SH 27X BRD (SUTURE) ×1 IMPLANT
SUT VICRYL 4-0 PS2 18IN ABS (SUTURE) ×3 IMPLANT
SYR 20CC LL (SYRINGE) ×3 IMPLANT
SYR CONTROL 10ML LL (SYRINGE) ×3 IMPLANT
TOWEL GREEN STERILE (TOWEL DISPOSABLE) ×3 IMPLANT
WATER STERILE IRR 1000ML POUR (IV SOLUTION) ×3 IMPLANT

## 2017-07-02 NOTE — Anesthesia Postprocedure Evaluation (Signed)
Anesthesia Post Note  Patient: BIRDELL FRASIER  Procedure(s) Performed: RIGHT NECK EXPLORATION WITH INTRAOPERATIVE ULTRASOUND (Right )     Patient location during evaluation: PACU Anesthesia Type: General Level of consciousness: awake and alert Pain management: pain level controlled Vital Signs Assessment: post-procedure vital signs reviewed and stable Respiratory status: spontaneous breathing, nonlabored ventilation, respiratory function stable and patient connected to nasal cannula oxygen Cardiovascular status: blood pressure returned to baseline and stable Postop Assessment: no apparent nausea or vomiting Anesthetic complications: no    Last Vitals:  Vitals:   07/02/17 0802 07/02/17 0854  BP:  120/81  Pulse:  91  Resp:  20  Temp: 36.5 C 36.6 C  SpO2:  98%    Last Pain:  Vitals:   07/02/17 0854  TempSrc: Oral  PainSc:                  Catalina Gravel

## 2017-07-02 NOTE — Progress Notes (Signed)
   VASCULAR SURGERY ASSESSMENT & PLAN:   I was called to see this patient in approximately 5:15 AM because of significant expressive a aphasia.  I evaluated her soon thereafter and this had improved.  She had very mild expressive a aphasia.  She had no focal weakness in the arms or legs.  As I am unable to obtain a duplex scan at this hour I did obtain the duplex scanner from the operating room.  I was unable to adequately assess whether or not the internal carotid artery was open.  In reexamining her she appeared to have some slight left lower extremity weakness also.  Therefore I have recommended urgent return to the operating room to be sure that the carotid endarterectomy site is open.  If this is open and she will need a CT scan and this shows no evidence of bleed then probably intravenous heparin because of potential embolization.  I have discussed the situation with the patient's daughter who is agreeable and we will proceed urgently.  SUBJECTIVE:   No specific complaints but has some expressive a aphasia.  PHYSICAL EXAM:   Vitals:   07/01/17 1930 07/01/17 1951 07/01/17 2250 07/02/17 0307  BP:  135/82 124/81 104/77  Pulse: (!) 106 (!) 106 (!) 105 96  Resp: 18 (!) 21 (!) 21   Temp: 97.8 F (36.6 C) 98.4 F (36.9 C) 97.6 F (36.4 C) 97.9 F (36.6 C)  TempSrc: Oral Oral Oral Oral  SpO2: 97% 98% 99% 99%  Weight:      Height:       Mild left lower extremity weakness.  LABS:   Lab Results  Component Value Date   WBC 7.4 07/01/2017   HGB 7.3 (L) 07/01/2017   HCT 26.0 (L) 07/01/2017   MCV 67.7 (L) 07/01/2017   PLT 265 07/01/2017   Lab Results  Component Value Date   CREATININE 0.95 07/01/2017   Lab Results  Component Value Date   INR 1.13 07/01/2017   CBG (last 3)  Recent Labs    07/01/17 1747 07/01/17 2057 07/02/17 0505  GLUCAP 159* 205* 169*    PROBLEM LIST:    Active Problems:   Carotid artery stenosis, symptomatic, right   CURRENT MEDS:   . acyclovir   800 mg Oral Daily  . amitriptyline  50 mg Oral QHS  . aspirin EC  81 mg Oral Daily  . busPIRone  30 mg Oral BID  . cholecalciferol  1,000 Units Oral Daily  . clopidogrel  75 mg Oral Daily  . docusate sodium  100 mg Oral Daily  . donepezil  5 mg Oral QHS  . furosemide  20 mg Oral BID  . gabapentin  100 mg Oral TID  . insulin aspart  0-15 Units Subcutaneous TID WC  . lamoTRIgine  200 mg Oral BID  . loratadine  10 mg Oral Daily  . meloxicam  7.5 mg Oral Daily  . metFORMIN  1,000 mg Oral BID WC  . pantoprazole  40 mg Oral Daily  . simvastatin  40 mg Oral q1800  . venlafaxine XR  150 mg Oral Q breakfast  . venlafaxine XR  37.5 mg Oral Q breakfast    Deitra Mayo Beeper: [phone] Office: [phone] 07/02/2017

## 2017-07-02 NOTE — Anesthesia Postprocedure Evaluation (Signed)
Anesthesia Post Note  Patient: Carol Richmond  Procedure(s) Performed: ENDARTERECTOMY CAROTID RIGHT (Right Neck) RIGHT CAROTID PATCH ANGIOPLASTY (Right Neck)     Patient location during evaluation: PACU Anesthesia Type: General Level of consciousness: awake and alert Pain management: pain level controlled Vital Signs Assessment: post-procedure vital signs reviewed and stable Respiratory status: spontaneous breathing, nonlabored ventilation, respiratory function stable and patient connected to nasal cannula oxygen Cardiovascular status: blood pressure returned to baseline and stable Postop Assessment: no apparent nausea or vomiting Anesthetic complications: no    Last Vitals:  Vitals:   07/02/17 1100 07/02/17 1149  BP: 94/71 94/71  Pulse: 90 91  Resp: 18 16  Temp:  36.6 C  SpO2: 100% 99%    Last Pain:  Vitals:   07/02/17 1149  TempSrc: Oral  PainSc:                  Kayvan Hoefling P Pastor Sgro

## 2017-07-02 NOTE — Anesthesia Preprocedure Evaluation (Signed)
Anesthesia Evaluation  Patient identified by MRN, date of birth, ID band Patient awake    Reviewed: Allergy & Precautions, NPO status , Patient's Chart, lab work & pertinent test results, reviewed documented beta blocker date and time Preop documentation limited or incomplete due to emergent nature of procedure.  Airway Mallampati: III  TM Distance: >3 FB Neck ROM: Full    Dental  (+) Upper Dentures, Lower Dentures   Pulmonary asthma , sleep apnea ,  On home O2   Pulmonary exam normal breath sounds clear to auscultation       Cardiovascular hypertension, Pt. on medications and Pt. on home beta blockers Normal cardiovascular exam Rhythm:Regular Rate:Normal  ECG: NSR, LBBB, rate 97  ECHO: Normal LV wall thickness with LVEF 60-65% and grade 1 diastolic dysfunction. Moderate left atrial enlargement. Thickened mitral leaflets with evidence of mild mitral stenosis and mild to moderate mitral regurgitation made up of two separate jets. Normal right ventricular contraction. Trivial tricuspid regurgitation with normal estimated PASP. Prominent pericardial fat pad.    Neuro/Psych  Headaches, Seizures -, Well Controlled,  PSYCHIATRIC DISORDERS Anxiety Depression Expressive aphasia, LLE weakness s/p R CEA on 07/01/17 TIA   GI/Hepatic Neg liver ROS, GERD  Medicated and Controlled,IBS (irritable bowel syndrome)   Endo/Other  diabetes, Oral Hypoglycemic Agents  Renal/GU negative Renal ROS     Musculoskeletal  (+) Arthritis ,   Abdominal   Peds  Hematology  (+) anemia , HLD   Anesthesia Other Findings   Reproductive/Obstetrics                             Anesthesia Physical  Anesthesia Plan  ASA: IV  Anesthesia Plan: General   Post-op Pain Management:    Induction: Intravenous  PONV Risk Score and Plan: 3 and Ondansetron, Dexamethasone and Treatment may vary due to age or medical  condition  Airway Management Planned: Oral ETT  Additional Equipment: Arterial line  Intra-op Plan:   Post-operative Plan: Extubation in OR  Informed Consent: I have reviewed the patients History and Physical, chart, labs and discussed the procedure including the risks, benefits and alternatives for the proposed anesthesia with the patient or authorized representative who has indicated his/her understanding and acceptance.   Dental advisory given  Plan Discussed with: CRNA  Anesthesia Plan Comments:         Anesthesia Quick Evaluation

## 2017-07-02 NOTE — Anesthesia Procedure Notes (Signed)
Procedure Name: Intubation Date/Time: 07/02/2017 6:17 AM Performed by: Catalina Gravel, MD Pre-anesthesia Checklist: Patient identified, Emergency Drugs available, Suction available, Patient being monitored and Timeout performed Patient Re-evaluated:Patient Re-evaluated prior to induction Oxygen Delivery Method: Circle system utilized Preoxygenation: Pre-oxygenation with 100% oxygen Induction Type: IV induction Ventilation: Mask ventilation without difficulty Laryngoscope Size: Mac and 3 Grade View: Grade I Tube type: Oral Tube size: 7.0 mm Number of attempts: 1 Airway Equipment and Method: Stylet Placement Confirmation: ETT inserted through vocal cords under direct vision,  positive ETCO2 and breath sounds checked- equal and bilateral Secured at: 22 cm Tube secured with: Tape Dental Injury: Teeth and Oropharynx as per pre-operative assessment

## 2017-07-02 NOTE — Progress Notes (Signed)
  Echocardiogram 2D Echocardiogram has been performed.  Carol Richmond 07/02/2017, 4:56 PM

## 2017-07-02 NOTE — Progress Notes (Signed)
Patient received from PACU, A&Ox4, VSS. No expressive aphasia noted and moderate strength in all extremities.

## 2017-07-02 NOTE — Progress Notes (Signed)
Transferred to CT scan. From CT scam  Patient will go back to [phone]

## 2017-07-02 NOTE — Op Note (Signed)
    NAME: Carol Richmond    MRN: [id number] DOB: October 25, 1950    DATE OF OPERATION: 07/02/2017  PREOP DIAGNOSIS:    Expressive a aphasia and left lower extremity weakness  POSTOP DIAGNOSIS:    Same  PROCEDURE:    Exploration right carotid endarterectomy site and intraoperative duplex  SURGEON: Judeth Cornfield. Scot Dock, MD, FACS  ASSIST: Leontine Locket, PA  ANESTHESIA: General  EBL: Minimal  INDICATIONS:    PAT [name] is a 67 y.o. female who underwent a right carotid endarterectomy yesterday.  This morning she developed some expressive aphasia and left lower extremity weakness.  As I was unable to obtain a formal duplex 5 AM I elected to proceed with urgent exploration to be sure that the carotid endarterectomy site was patent.  FINDINGS:   The carotid endarterectomy site was widely patent.  There was good diastolic flow throughout.  Intraoperative duplex showed no technical problems that I could identify.  TECHNIQUE:   The patient was taken urgently to the operating room and received a general anesthetic.  The right neck was prepped and draped in usual sterile fashion.  The previous incision was opened.  The dissection was carried down to the common carotid artery and the common carotid, external carotid, and internal carotid arteries were exposed.  There was a good pulse in the internal carotid artery.  Intraoperative Doppler showed good flow in the internal carotid artery throughout with no areas of increased velocity.  There was good diastolic flow.  Likewise there was good flow in the common carotid artery and external carotid artery.  I then performed intraoperative duplex scan.  This showed no technical problems that I can identify.  There was good flow throughout the common and internal carotid artery identified with color flow.  Hemostasis was obtained in the wound.  The wound was then closed with a deep layer of 3-0 Vicryl, the platysma was closed with running 3-0  Vicryl and the skin was closed with 4-0 Vicryl.  Dermabond was applied.  The patient was somewhat slow to wake up and was moving all extremities at the completion.  The patient was transferred to the recovery room in stable condition.  All needle and sponge counts were correct.  Deitra Mayo, MD, FACS [facility]  DATE OF DICTATION:   07/02/2017

## 2017-07-02 NOTE — Consult Note (Addendum)
   [facility] CM Inpatient Consult   07/02/2017  TAMARI REDWINE [birth year] [id number]   Patient is active with [facility]. She is followed by [facility] Education officer, museum for [employer].   Spoke with patient and husband at bedside to discuss [facility] follow up. Both are agreeable and [facility] written consent obtained.   Mrs. Doolen and husband state they could use the additional follow up after this hospital stay. Patient s/p CEA and developed expressive aphasia, focal weakness in the arms and legs and had to urgently go back to the OR.   Mr. Palomino states " we are not sure she can walk". They are requesting a wheelchair for home use.   Spoke with inpatient RNCM who states she will ask MD for PT consult. Made inpatient RNCM aware of wheelchair request and that  [facility] will follow post discharge.   Will request for Mrs. Barner to be assigned to [facility]. Patient has a history of DM, HTN, GERD, asthma.    Marthenia Rolling, MSN-Ed, RN,BSN [facility] Liaison [phone]

## 2017-07-02 NOTE — Transfer of Care (Signed)
Immediate Anesthesia Transfer of Care Note  Patient: Carol Richmond  Procedure(s) Performed: RIGHT NECK EXPLORATION WITH INTRAOPERATIVE ULTRASOUND (Right )  Patient Location: PACU  Anesthesia Type:General  Level of Consciousness: responds to stimulation  Airway & Oxygen Therapy: Patient Spontanous Breathing and Patient connected to face mask oxygen  Post-op Assessment: Report given to RN, Post -op Vital signs reviewed and stable and Patient moving all extremities X 4  Post vital signs: Reviewed and stable  Last Vitals:  Vitals Value Taken Time  BP    Temp    Pulse    Resp    SpO2      Last Pain:  Vitals:   07/02/17 0307  TempSrc: Oral  PainSc: 0-No pain      Patients Stated Pain Goal: 0 ([id number])  Complications: No apparent anesthesia complications

## 2017-07-02 NOTE — Progress Notes (Signed)
    Subjective  - POD #1  Patient had recurrent symptoms this am and was taken back to the OR by Dr. Scot Dock  She has no complaints this afternoon.   No neurologic deficits   Physical Exam:  Neuro intact Neck soft   CTA is unremarkable for acute event       Assessment/Plan:  POD #1  Unknown etiology of neurologic events this am.  Now that she had another event post op with a normal CTA, she potentially could be a asymptomatic carotid with her neurologic issues from another etiology.  I will continue DAPT.   Will get echo tomorrow Start IV heparin at 600 cc/hr  [place]s Coralie Stanke 07/02/2017 3:45 PM --  Vitals:   07/02/17 1100 07/02/17 1149  BP: 94/71 94/71  Pulse: 90 91  Resp: 18 16  Temp:  97.8 F (36.6 C)  SpO2: 100% 99%    Intake/Output Summary (Last 24 hours) at 07/02/2017 1545 Last data filed at 07/02/2017 0800 Gross per 24 hour  Intake 1553.75 ml  Output 50 ml  Net 1503.75 ml     Laboratory CBC    Component Value Date/Time   WBC 9.0 07/02/2017 0424   HGB 7.8 (L) 07/02/2017 0424   HCT 26.3 (L) 07/02/2017 0424   PLT 218 07/02/2017 0424    BMET    Component Value Date/Time   NA 138 07/02/2017 0424   K 4.5 07/02/2017 0424   CL 104 07/02/2017 0424   CO2 24 07/02/2017 0424   GLUCOSE 145 (H) 07/02/2017 0424   BUN 21 (H) 07/02/2017 0424   CREATININE 0.93 07/02/2017 0424   CREATININE 0.82 02/28/2011 1038   CALCIUM 8.5 (L) 07/02/2017 0424   GFRNONAA >60 07/02/2017 0424   GFRAA >60 07/02/2017 0424    COAG Lab Results  Component Value Date   INR 1.13 07/01/2017   INR 1.11 05/28/2017   INR 1.08 11/18/2013   No results found for: PTT  Antibiotics Anti-infectives (From admission, onward)   Start     Dose/Rate Route Frequency Ordered Stop   07/02/17 1000  acyclovir (ZOVIRAX) tablet 800 mg     800 mg Oral Daily 07/01/17 1732     07/02/17 0915  ceFAZolin (ANCEF) IVPB 2g/100 mL premix     2 g 200 mL/hr over 30 Minutes Intravenous Every 8 hours  07/02/17 0900 07/03/17 0114   07/01/17 1845  ceFAZolin (ANCEF) IVPB 2g/100 mL premix     2 g 200 mL/hr over 30 Minutes Intravenous Every 8 hours 07/01/17 1732 07/02/17 0515   07/01/17 0930  ceFAZolin (ANCEF) IVPB 2g/100 mL premix  Status:  Discontinued     2 g 200 mL/hr over 30 Minutes Intravenous To ShortStay Surgical 07/01/17 0913 07/01/17 1720       V. Leia Alf, M.D. [facility] [place] Office: [phone] Pager:  [phone]

## 2017-07-02 NOTE — Progress Notes (Signed)
@  0500 paged Dr. Scot Dock on-call for Dr. Trula Slade regarding pt exhibiting stroke like symptoms during 4am assessment and lab draws. Slight droop on L (not new), Ataxia in all extremities, and mild aphasia (NIH of 7). MD came to bedside to assess at approx. 0500. Upon assessment, MD decided to take pt emergently back to OR to explore carotid for occlusion. MD attempted but was unable to contact pt's husband via phone regarding need for return to OR. MD was able to contact pt's daughter via phone who conveyed understanding of situation. Pt signed own consent as witnessed by MD and this RN. Pt taken back to OR and report given to Anesthesia at approx. 2751.

## 2017-07-03 ENCOUNTER — Telehealth: Payer: Self-pay | Admitting: Surgery

## 2017-07-03 ENCOUNTER — Encounter (HOSPITAL_COMMUNITY): Payer: Self-pay | Admitting: Vascular Surgery

## 2017-07-03 ENCOUNTER — Inpatient Hospital Stay (HOSPITAL_COMMUNITY): Payer: PPO

## 2017-07-03 ENCOUNTER — Other Ambulatory Visit: Payer: Self-pay

## 2017-07-03 LAB — CBC
HEMATOCRIT: 25.8 % — AB (ref 36.0–46.0)
HEMOGLOBIN: 7.8 g/dL — AB (ref 12.0–15.0)
MCH: 22.2 pg — AB (ref 26.0–34.0)
MCHC: 30.2 g/dL (ref 30.0–36.0)
MCV: 73.3 fL — AB (ref 78.0–100.0)
Platelets: 208 10*3/uL (ref 150–400)
RBC: 3.52 MIL/uL — ABNORMAL LOW (ref 3.87–5.11)
RDW: 22 % — ABNORMAL HIGH (ref 11.5–15.5)
WBC: 16.5 10*3/uL — ABNORMAL HIGH (ref 4.0–10.5)

## 2017-07-03 LAB — GLUCOSE, CAPILLARY
Glucose-Capillary: 165 mg/dL — ABNORMAL HIGH (ref 65–99)
Glucose-Capillary: 147 mg/dL — ABNORMAL HIGH (ref 65–99)

## 2017-07-03 MED ORDER — OXYCODONE-ACETAMINOPHEN 5-325 MG PO TABS: 1.0000 | ORAL_TABLET | Freq: Four times a day (QID) | ORAL | 0 refills | Status: DC | PRN

## 2017-07-03 NOTE — Discharge Summary (Signed)
Discharge Summary     Carol Richmond 1950-03-28 67 y.o. female  [id number]  Admission Date: 07/01/2017  Discharge Date: 07/03/17  Physician: Serafina Mitchell, MD  Admission Diagnosis: RIGHT CAROTID STENOSIS  Discharge Day services:    see progress note 07/03/17 Physical Exam: Vitals:   07/03/17 0416 07/03/17 0754  BP: 124/80 114/77  Pulse:  94  Resp:  (!) 27  Temp: 98.8 F (37.1 C) 97.7 F (36.5 C)  SpO2:  96%     Hospital Course:  The patient was admitted to the hospital and taken to the operating room on 07/02/2017 and underwent right carotid endarterectomy by Dr. Trula Slade on 07/01/17.  The pt tolerated the procedure well and was transported to the PACU in good condition.  Overnight patient experienced expressive aphasia and LLE weakness.  She was brought urgently to the OR by Dr. Scot Dock for exploration of R carotid surgical site.  Dr. Scot Dock found the common and internal carotid to be widely patent.  Post operatively she was started on IV heparin.  No neurological deficits were noted and prior symptoms had resolved.  CTA head and neck was unremarkable for acute event.  Echo also obtained which did not show any findings that could be considered an etiology for patients neuro symptoms.  Today again patient is without neuro deficits and neuro exam is at baseline.  Dr. Trula Slade discussed case with Neurology who did not see utility in MRI and recommended discharge home and to follow with her Neuologist Dr. Delice Lesch in the next 2 weeks.  She will also be prescribed 1-2 days of narcotic pain medication.  She will follow up with Dr. Trula Slade in 2 weeks.  Discharge instructions were reviewed with the patient and daughter and they voiced their understanding.  She will be discharged this afternoon in stable condition.    Recent Labs    07/01/17 0937 07/02/17 0424  NA 142 138  K 3.2* 4.5  CL 103 104  CO2 23 24  GLUCOSE 152* 145*  BUN 18 21*  CALCIUM 9.0 8.5*   Recent Labs   07/02/17 0424 07/03/17 0433  WBC 9.0 16.5*  HGB 7.8* 7.8*  HCT 26.3* 25.8*  PLT 218 208   Recent Labs    07/01/17 0937  INR 1.13     Discharge Instructions    AMB Referral to [facility] [facility]   Complete by:  As directed    Please assign to [place] for DM, HTN. Active with [facility] B[facility]. Written consent obtained. [facility] listed as doing toc. Currently at [facility]. Please call with questions. Marthenia Rolling, [place] HRCBULA-[phone]   Reason for consult:  Please assign to [place]   Diagnoses of:  Diabetes   Expected date of contact:  1-3 days (reserved for hospital discharges)      Discharge Diagnosis:  RIGHT CAROTID STENOSIS  Secondary Diagnosis: Patient Active Problem List   Diagnosis Date Noted  . Carotid artery stenosis, symptomatic, right 07/01/2017  . Stenosis of right carotid artery 06/23/2017  . Orthostatic tremor 06/23/2017  . Transient left leg weakness 08/16/2016  . Anxiety state 07/01/2016  . Mild neurocognitive disorder 05/02/2016  . Migraine without aura and without status migrainosus, not intractable 01/27/2015  . Depression 07/25/2014  . Chronic daily headache 12/03/2013  . Transient alteration of awareness 05/25/2013  . Osteoarthritis of right knee 09/03/2012  . Right knee DJD   . IBS (irritable bowel syndrome)   . Diverticulosis  of colon (without mention of hemorrhage)   . GERD (gastroesophageal reflux disease)   . Hypertension   . Hormone replacement therapy (postmenopausal)   . Migraine   . Seizures ([place])   . Hemorrhoids, external, without mention of complication   . Hemorrhoids, external, without mention of complication   . SHOULDER PAIN 08/24/2008  . IMPINGEMENT SYNDROME 08/24/2008   Past Medical History:  Diagnosis Date  . Asthma   . Diabetes mellitus without complication ([place])   . Diarrhea   . Diverticulosis of colon (without mention of hemorrhage)   .  Functional ovarian cysts   . GERD (gastroesophageal reflux disease)   . Hemorrhoids, external, without mention of complication   . Hormone replacement therapy (postmenopausal)   . Hypertension   . IBS (irritable bowel syndrome)   . Migraine   . Right knee DJD   . Seizures ([place])   . Shortness of breath   . Sleep apnea    no cpap    study 2 yrs ago    Allergies as of 07/03/2017   No Known Allergies     Medication List    TAKE these medications   Abdominal Binder/Elastic Med Misc Apply binder each day   Medical Compression Stockings Misc 2 Devices by Does not apply route daily.   acyclovir 800 MG tablet Commonly known as:  ZOVIRAX Take 800 mg by mouth daily.   albuterol 108 (90 Base) MCG/ACT inhaler Commonly known as:  PROVENTIL HFA;VENTOLIN HFA Inhale 2 puffs into the lungs every 6 (six) hours as needed for wheezing or shortness of breath.   amitriptyline 50 MG tablet Commonly known as:  ELAVIL Take 1 tablet (50 mg total) by mouth at bedtime.   aspirin EC 81 MG tablet Take 81 mg by mouth daily.   busPIRone 15 MG tablet Commonly known as:  BUSPAR Take 30 mg by mouth 2 (two) times daily.   cholecalciferol 1000 units tablet Commonly known as:  VITAMIN D Take 1,000 Units by mouth daily.   clopidogrel 75 MG tablet Commonly known as:  PLAVIX Take 1 tablet (75 mg total) by mouth daily.   donepezil 5 MG tablet Commonly known as:  ARICEPT Take 1 tablet (5 mg total) by mouth at bedtime.   furosemide 20 MG tablet Commonly known as:  LASIX Take 20 mg by mouth 2 (two) times daily.   gabapentin 100 MG capsule Commonly known as:  NEURONTIN Take 100 mg by mouth 3 (three) times daily.   lamoTRIgine 200 MG tablet Commonly known as:  LAMICTAL Take 200 mg by mouth 2 (two) times daily.   levocetirizine 5 MG tablet Commonly known as:  XYZAL Take 5 mg by mouth every evening.   lisinopril-hydrochlorothiazide 20-12.5 MG tablet Commonly known as:   PRINZIDE,ZESTORETIC Take 1 tablet by mouth daily.   meloxicam 7.5 MG tablet Commonly known as:  MOBIC Take 7.5 mg by mouth daily.   metFORMIN 1000 MG tablet Commonly known as:  GLUCOPHAGE Take 1,000 mg by mouth 2 (two) times daily with a meal.   metoprolol succinate 50 MG 24 hr tablet Commonly known as:  TOPROL XL Take 1 tablet (50 mg total) by mouth daily.   omeprazole 20 MG capsule Commonly known as:  PRILOSEC Take 20 mg by mouth daily.   oxyCODONE-acetaminophen 5-325 MG tablet Commonly known as:  PERCOCET/ROXICET Take 1 tablet by mouth every 6 (six) hours as needed for up to 8 doses for moderate pain.   OXYGEN Inhale 2 L into the  lungs.   simvastatin 20 MG tablet Commonly known as:  ZOCOR Take 2 tablets daily What changed:    how much to take  how to take this  when to take this  additional instructions   venlafaxine XR 37.5 MG 24 hr capsule Commonly known as:  EFFEXOR-XR Take total of 187.5 mg daily (150 mg + 37.5 mg)   venlafaxine XR 150 MG 24 hr capsule Commonly known as:  EFFEXOR-XR Take total of 187.5 (150+37.5) mg daily        Discharge Instructions:   [facility] Discharge Instructions Carotid Endarterectomy (CEA)  Please refer to the following instructions for your post-procedure care. Your surgeon or physician assistant will discuss any changes with you.  Activity  You are encouraged to walk as much as you can. You can slowly return to normal activities but must avoid strenuous activity and heavy lifting until your doctor tell you it's OK. Avoid activities such as vacuuming or swinging a golf club. You can drive after one week if you are comfortable and you are no longer taking prescription pain medications. It is normal to feel tired for serval weeks after your surgery. It is also normal to have difficulty with sleep habits, eating, and bowel movements after surgery. These will go away with  time.  Bathing/Showering  You may shower after you come home. Do not soak in a bathtub, hot tub, or swim until the incision heals completely.  Incision Care  Shower every day. Clean your incision with mild soap and water. Pat the area dry with a clean towel. You do not need a bandage unless otherwise instructed. Do not apply any ointments or creams to your incision. You may have skin glue on your incision. Do not peel it off. It will come off on its own in about one week. Your incision may feel thickened and raised for several weeks after your surgery. This is normal and the skin will soften over time. For Men Only: It's OK to shave around the incision but do not shave the incision itself for 2 weeks. It is common to have numbness under your chin that could last for several months.  Diet  Resume your normal diet. There are no special food restrictions following this procedure. A low fat/low cholesterol diet is recommended for all patients with vascular disease. In order to heal from your surgery, it is CRITICAL to get adequate nutrition. Your body requires vitamins, minerals, and protein. Vegetables are the best source of vitamins and minerals. Vegetables also provide the perfect balance of protein. Processed food has little nutritional value, so try to avoid this.  Medications  Resume taking all of your medications unless your doctor or physician assistant tells you not to.  If your incision is causing pain, you may take over-the- counter pain relievers such as acetaminophen (Tylenol). If you were prescribed a stronger pain medication, please be aware these medications can cause nausea and constipation.  Prevent nausea by taking the medication with a snack or meal. Avoid constipation by drinking plenty of fluids and eating foods with a high amount of fiber, such as fruits, vegetables, and grains. Do not take Tylenol if you are taking prescription pain medications.  Follow Up  Our office will  schedule a follow up appointment 2-3 weeks following discharge.  Please call us immediately for any of the following conditions  Increased pain, redness, drainage (pus) from your incision site. Fever of 101 degrees or higher. If you  should develop stroke (slurred speech, difficulty swallowing, weakness on one side of your body, loss of vision) you should call 911 and go to the nearest emergency room.  Reduce your risk of vascular disease:  Stop smoking. If you would like help call [facility] at 1-800-QUIT-NOW (231) 336-0625) or [place] at [phone]. Manage your cholesterol Maintain a desired weight Control your diabetes Keep your blood pressure down  If you have any questions, please call the office at [phone].  Disposition: home  Patient's condition: is Good  Follow up: 1. Dr. Delice Lesch in 2 weeks 2. Dr. Trula Slade in 2 weeks   Dagoberto Ligas, PA-C [facility] [phone]   --- For [facility] use ---   Modified Rankin score at D/C (0-6): 0  IV medication needed for:  1. Hypertension: No 2. Hypotension: No  Post-op Complications: Yes  1. Post-op CVA or TIA: Yes  If yes: Event classification (right eye, left eye, right cortical, left cortical, verterobasilar, other): transient expressive aphasia and LLE weakness  If yes: Timing of event  >=6 hrs post-op  2. CN injury: No  If yes: CN  injuried   3. Myocardial infarction: No  If yes: Dx by (EKG or clinical, Troponin):   4.  CHF: No  5.  Dysrhythmia (new): No  6. Wound infection: No  7. Reperfusion symptoms: No  8. Return to OR: Yes  If yes: return to OR for neurologic symptoms  Discharge medications: Statin use:  Yes ASA use:  Yes   Beta blocker use:  Yes ACE-Inhibitor use:  Yes  ARB use:  No CCB use: No P2Y12 Antagonist use: Yes, [x]  Plavix, [ ]  Plasugrel, [ ]  Ticlopinine, [ ]  Ticagrelor, [ ]  Other, [ ]  No for medical reason, [ ]  Non-compliant, [ ]   Not-indicated Anti-coagulant use:  No, [ ]  Warfarin, [ ]  Rivaroxaban, [ ]  Dabigatran,

## 2017-07-03 NOTE — Telephone Encounter (Signed)
-----   Message from Penni Homans, RN sent at 07/03/2017 11:28 AM EDT ----- Regarding: Appointment   ----- Message ----- From: Iline Oven Sent: 07/03/2017  11:03 AM To: Vvs Charge Pool  Can you schedule an appt for this pt with Dr. Trula Slade in 2 weeks.  Can you also make sure she has follow up appt with her Neurologist Dr. Delice Lesch in the next 2 weeks.  PO R CEA. Thanks, [facility]

## 2017-07-03 NOTE — Telephone Encounter (Signed)
Sched Dr. Delice Lesch 07/15/17 at 3:30. Sched [facility] 07/21/17 at 3:45. Lm on hm# to inform pt of appts.

## 2017-07-03 NOTE — Care Management Note (Signed)
Case Management Note Marvetta Gibbons RN, BSN Unit 4E-Case Manager [phone]  Patient Details  Name: Carol Richmond MRN: [id number] Date of Birth: 08/02/1950  Subjective/Objective:  Pt admitted s/p CEA post op with stroke like symptoms and taken back to OR for exploration.                   Action/Plan: PTA pt lived at home with spouse- pt was active with [facility] per [facility] note pt and spouse were requesting wheelchair- however pt moving independently around room today and is back to baseline with no further stroke like symptoms - no medical need for wheelchair at this time- Pt to d/c home with spouse today.   Expected Discharge Date:  07/03/17               Expected Discharge Plan:  Home/Self Care  In-House Referral:  NA  Discharge planning Services  CM Consult  Post Acute Care Choice:    Choice offered to:     DME Arranged:    DME Agency:     HH Arranged:    HH Agency:     Status of Service:  Completed, signed off  If discussed at [place] of Stay Meetings, dates discussed:    Discharge Disposition: home/self care   Additional Comments:  Dawayne Patricia, RN 07/03/2017, 11:48 AM

## 2017-07-03 NOTE — Progress Notes (Addendum)
  Progress Note    07/03/2017 7:45 AM 1 Day Post-Op  Subjective:  LLE weakness and speech have resolved.  She denies further stroke like symptoms.  She would like to be discharged home today   Vitals:   07/02/17 2349 07/03/17 0416  BP: 114/77 124/80  Pulse:    Resp:    Temp: 98.6 F (37 C) 98.8 F (37.1 C)  SpO2:     Physical Exam: Lungs:  Non labored Incisions:  R neck incision with local ecchymosis, minimal local edema, soft Extremities:  Moving all extremities well; grip strength symmetrical Abdomen:  Soft Neurologic: CN grossly intact  CBC    Component Value Date/Time   WBC 16.5 (H) 07/03/2017 0433   RBC 3.52 (L) 07/03/2017 0433   HGB 7.8 (L) 07/03/2017 0433   HCT 25.8 (L) 07/03/2017 0433   PLT 208 07/03/2017 0433   MCV 73.3 (L) 07/03/2017 0433   MCH 22.2 (L) 07/03/2017 0433   MCHC 30.2 07/03/2017 0433   RDW 22.0 (H) 07/03/2017 0433   LYMPHSABS 2.8 05/28/2017 1038   MONOABS 1.1 (H) 05/28/2017 1038   EOSABS 0.1 05/28/2017 1038   BASOSABS 0.0 05/28/2017 1038    BMET    Component Value Date/Time   NA 138 07/02/2017 0424   K 4.5 07/02/2017 0424   CL 104 07/02/2017 0424   CO2 24 07/02/2017 0424   GLUCOSE 145 (H) 07/02/2017 0424   BUN 21 (H) 07/02/2017 0424   CREATININE 0.93 07/02/2017 0424   CREATININE 0.82 02/28/2011 1038   CALCIUM 8.5 (L) 07/02/2017 0424   GFRNONAA >60 07/02/2017 0424   GFRAA >60 07/02/2017 0424    INR    Component Value Date/Time   INR 1.13 07/01/2017 0937     Intake/Output Summary (Last 24 hours) at 07/03/2017 0745 Last data filed at 07/02/2017 1745 Gross per 24 hour  Intake 50 ml  Output 1 ml  Net 49 ml     Assessment/Plan:  67 y.o. female is s/p R CEA with subsequent exploration due to LLE weakness and expressive aphasia 1 Day Post-Op   Echo results pending Continue heparin for now Neuro exam at baseline OOB If echo negative and no further neuro even possible d/c this afternoon vs tomorrow am   Dagoberto Ligas,  PA-C [facility] [phone] 07/03/2017 7:45 AM  Family updated.  Spoke with neurology.  Since patient is back to baseline, can d/c home today on DAPT.  Will have her follow up with Dr. Delice Lesch.  Annamarie Major

## 2017-07-03 NOTE — Patient Outreach (Signed)
[facility]) Care Management  07/03/2017  Carol Richmond 23-Aug-1950 [id number]   [facility] BSW had phone outreach scheduled for case closure today but patient is currently hospitalized.  BSW closing social work discipline due to no other social work needs being identified during last communication.  BSW will assist if additional social work needs are identified after current hospitalization.    Ronn Melena, BSW Social Worker [phone]

## 2017-07-04 ENCOUNTER — Other Ambulatory Visit: Payer: Self-pay | Admitting: *Deleted

## 2017-07-04 NOTE — Patient Outreach (Addendum)
Referral received from hospital liason, pt hospitalized 5/14-5/16/19 for right carotid stenosis and had endarterectomy 07/01/17, Primary MD office provides transtition of care, Pt is already active with [facility] social worker, telephone call to pt for screening, spoke with pt, HIPAA verified, screening completed, pt reports she does have appointments with Dr. Delice Lesch (neurologist) and Dr. Trula Slade scheduled, pt states due to having a lot of appointments she will schedule primary MD appointment at later date, pt verbalizes she has all medications and takes as prescribed and is able to afford. Pt reports she checks CBG several times weekly with most readings in low 100's range.  Pt reports she is mostly independent and her husband Glendell Docker assists her if needed. Pt reports she had some facial swelling this morning "but looks much better now"  Reports " my daughter is coming to my house today to look at everything, if something doesn't look right, she will call the doctor"  RN CM reviewed signs/ symptoms infection and importance of calling MD early on for redness, swelling, signs of infection, reviewed activity restrictions and no heavy lifting. RN CM offered to see pt for home visit and pt refuses visit at present stating " my granddaughter is getting married and we are very busy with this and I have so many appointments"  Pt states she will call RN CM back when it will be convenient to schedule home visit if pt decides to do so, care planning not completed due to pt unsure about participation in program.  PLAN If pt has not called back by 07/18/17, outreach pt at that time  Jacqlyn Larsen [facility], [place] [phone]

## 2017-07-09 ENCOUNTER — Other Ambulatory Visit: Payer: Self-pay | Admitting: *Deleted

## 2017-07-09 NOTE — Patient Outreach (Signed)
Telephone assessment. I spoke to Mrs. Natzke who tells me she is not feeling well. When I asked her what was bothering her she said, "My arm." I asked what is wrong with your arm? She responded that " it hurts and aches." I asked which arm? She responded, "My left arm, but it's probably nothing." I asked it she was SOB? She states, "Can you call me another time? I responded that due to her medical hx an aching arm could be an indication of a heart condition in which she should go to the hospital. I asked her that if she continued to have this pain and other cardiac or respiratory sxs (nausea, sweating, chest, arm, jaw pain, SOB, feeling on impending doom) she should call 911. She said she would. I told her I would call her tomorrow.  Eulah Pont. Myrtie Neither, MSN, [facility] Gerontological Nurse Practitioner [facility] Care Management [phone]

## 2017-07-10 ENCOUNTER — Other Ambulatory Visit: Payer: Self-pay | Admitting: *Deleted

## 2017-07-10 NOTE — Patient Outreach (Signed)
I tried to follow up with [name] today. Her [employer] phone says that it is disconnected or changed. I called her daughter, Carol Richmond and spoke to her. I advised that I was worried about her mother yesterday when she told me she had L arm pain and didn't feel well enough to talk to me long. I had advised pt that I was concerned with her hx of CAD and recent endarterectomy that this could be a sign of a pending new event. I advised her that if the pain got worse, she becomes, nauseaous, sweaty, SOB, she needs to call 911. I shared this with her daughter. I verified the phone number and it is correct. She will try to reach her mother later and let me know if she is OK and have her call me back.  Carol Pont. Myrtie Neither, MSN, [facility] Gerontological Nurse Practitioner [facility] Care Management [phone]

## 2017-07-11 ENCOUNTER — Other Ambulatory Visit: Payer: Self-pay | Admitting: *Deleted

## 2017-07-11 NOTE — Patient Outreach (Signed)
Telephone follow up for L arm pain. I was able to talk with pt today who said she is fine no further L arm pain. She advised me never to call her daughter, Kieth Brightly) at work cause she could lose her job and she almost did! Pt was coughing while on the phone and it was difficult for her to complete a sentence. I advised her that Jacqlyn Larsen, RN would be in touch and be coming to see her next week.  Eulah Pont. Myrtie Neither, MSN, [facility] Gerontological Nurse Practitioner [facility] Care Management [phone]

## 2017-07-13 DIAGNOSIS — Z96659 Presence of unspecified artificial knee joint: Secondary | ICD-10-CM | POA: Diagnosis not present

## 2017-07-13 DIAGNOSIS — J449 Chronic obstructive pulmonary disease, unspecified: Secondary | ICD-10-CM | POA: Diagnosis not present

## 2017-07-13 DIAGNOSIS — I5020 Unspecified systolic (congestive) heart failure: Secondary | ICD-10-CM | POA: Diagnosis not present

## 2017-07-15 ENCOUNTER — Ambulatory Visit (INDEPENDENT_AMBULATORY_CARE_PROVIDER_SITE_OTHER): Payer: PPO | Admitting: Neurology

## 2017-07-15 ENCOUNTER — Other Ambulatory Visit: Payer: Self-pay

## 2017-07-15 ENCOUNTER — Other Ambulatory Visit: Payer: Self-pay | Admitting: *Deleted

## 2017-07-15 ENCOUNTER — Encounter: Payer: Self-pay | Admitting: Neurology

## 2017-07-15 VITALS — BP 114/62 | HR 73 | Ht 64.0 in | Wt 173.0 lb

## 2017-07-15 DIAGNOSIS — I6521 Occlusion and stenosis of right carotid artery: Secondary | ICD-10-CM | POA: Diagnosis not present

## 2017-07-15 DIAGNOSIS — G43009 Migraine without aura, not intractable, without status migrainosus: Secondary | ICD-10-CM | POA: Diagnosis not present

## 2017-07-15 NOTE — Patient Outreach (Addendum)
Telephone call to patient for follow up and to schedule initial home visit, spoke with pt, HIPAA verified, Upon pt questioning, explained [facility] program again, pt states " I don't understand why someone has to come out here or why I need to be involved with this"  Pt states she is "very busy with lots of things going on right now and it's not a good time for any of this"  Pt declines further [facility] services for nursing, pt states she has [facility] contact information for future reference.  RN CM faxed case closure letter to primary MD Dr. Sherrie Sport.  PLAN Close case for nursing  Jacqlyn Larsen [facility] [place] Coordinator [phone]

## 2017-07-15 NOTE — Patient Instructions (Signed)
Great seeing you! Continue all your medications. Follow-up in 6 months, call for any changes.

## 2017-07-15 NOTE — Progress Notes (Signed)
NEUROLOGY FOLLOW UP OFFICE NOTE  GRENDA LORA [id number]  DOB: 1950/06/20  HISTORY OF PRESENT ILLNESS: I had the pleasure of seeing Carol Richmond in follow-up in the neurology clinic on 07/15/2017.  She is accompanied by her husband and daughter who help supplement the history today. The patient was last seen 3 weeks ago for new symptoms of leg shaking that occur when standing or usually when getting out of the car, more on the left. At one point family noted left facial droop. She was noted to have orthostatic hypotension as well as CTA head and neck imaging which showed critical stenosis of the right ICA with string sign, estimated greater than 90% stenosis in the right proximal ICA. She underwent right CEA with Dr. Trula Slade last 07/01/17. Overnight she had transient expressive aphasia and left leg weakness and was brought back to the OR for exploration, it was widely patent. No further issues since then. She and her family report she is doing very well post-op, she has not had any further episodes of falls or shaking, the dizziness is better as well. She denies any headache, focal numbness/tingling/weakness. On her last visit she was using a walker, but today ambulates without any assistance. Family helps with medications. She is on aspirin and Plavix and has an appointment with Dr. Trula Slade next week.   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 Records from her neurologist from 2009 to 2013 were reviewed. She was having episodes of abnormal smell like inhaling anesthesia, followed by breathing problems, numbness, disorientation, memory loss, difficulty finding words, and mild staggering gait. She was started on Keppra, then switched to Topamax. A 48-hour EEG in 2010 captured 2 typical spells with no associated EEG changes. Baseline EEG was normal. She started seeing a psychologist and  psychiatrist, Topamax was tapered and she was started on Lamictal for mood stabilization. She was told by the psychologist that she has "absent seizures" and that "the left side of her brain is getting worse, and this could be affected by her sleep apnea." She apparently had been seen by a neurologist at [facility] who felt the episodes of unresponsiveness could be due to sleep attacks. She has a diagnosis of obstructive sleep apnea. It appears the spells resolved in 2011.   Since November 2014 or so, she has had episodes of feeling off balance with speech difficulties where speech is slurred and she is unable to find appropriate words. They were standing at [facility] then she started rocking, could not keep her balance, then felt her speech was going. They were at a birthday party on 05/09/13 when it looked like she was waking up, then started "acting like a drunk person." She has been having episodes of blanking out "like she is out in space," occurring 2-3 times a week. She states these are different from the spells she had in 2009. Her husband reports that each time is worse than the other. She has been having headaches on a near daily basis. Headaches are over the vertex or bilateral temporal regions. She hears a "boom" inside her head and wants to pull her hair out. There is no associated nausea/vomiting/photo or phonophobia. She asked if these episodes can be caused by stress, reporting that she has been having more episodes since she has been taking care of her sister the past 3 months. She would "cry over nothing." She has been taking  Lexapro for many years and feels that "it does not seem to be working." She takes Ativan 11m twice a day for anxiety.   Update 05/30/17: Since her last visit, she has been to the ER last 05/28/17 for recurrent episodes of shaking, that day she was noted to have left facial droop and slurred speech. Her daughter reports that for the past 2 weeks, she would have episodes where she  legs would start shaking. They would last 2-5 minutes, she would stop and stand still for a few minutes then symptoms resolve. They always occur while up walking, never when sitting or supine. Her daughter has noticed them mostly occurring when she has been sitting in the car then gets out. The worst episode was 2 weeks ago Sunday, she went to open the door then started having the leg shaking and thought she was going down the banister. She went in the house and started sliding down, family got her to a chair and noted her mouth was drawn to the left. Another time her daughter noticed both hands were clenched. She would have multiple events in a day sometimes, so far today she has had 3. She had one while getting on to the scale in the office today, her daughter could tell she was having them, she started to have shaking on the legs and arms, L>R, leaned to her daughter on the right, lasting less than a minute. No post-event confusion. She denies any dizziness. Her daughter reports her BP is low most of the time, SBP usually under 100.   Diagnostic Data: I personally reviewed MRI brain without contrast done 05/25/17 which did not show any acute changes, there was mild to moderate chronic microvascular disease, R>L Her 24-hour EEG was mildly abnormal due to mild diffuse slowing of the background, no epileptiform discharges or electrographic seizures seen. She did not complete diary, but tells me today that she did have an episode where she felt she had a seizure and looked like she fell asleep.   PAST MEDICAL HISTORY: Past Medical History:  Diagnosis Date  . Asthma   . Diabetes mellitus without complication ([place]   . Diarrhea   . Diverticulosis of colon (without mention of hemorrhage)   . Functional ovarian cysts   . GERD (gastroesophageal reflux disease)   . Hemorrhoids, external, without mention of complication   . Hormone replacement therapy (postmenopausal)   . Hypertension   . IBS (irritable bowel  syndrome)   . Migraine   . Right knee DJD   . Seizures ([place]   . Shortness of breath   . Sleep apnea    no cpap    study 2 yrs ago    MEDICATIONS:  Outpatient Encounter Medications as of 07/15/2017  Medication Sig  . acyclovir (ZOVIRAX) 800 MG tablet Take 800 mg by mouth daily.   .Marland Kitchenalbuterol (PROVENTIL HFA;VENTOLIN HFA) 108 (90 BASE) MCG/ACT inhaler Inhale 2 puffs into the lungs every 6 (six) hours as needed for wheezing or shortness of breath.  .Marland Kitchenamitriptyline (ELAVIL) 50 MG tablet Take 1 tablet (50 mg total) by mouth at bedtime.  .Marland Kitchenaspirin EC 81 MG tablet Take 81 mg by mouth daily.   . busPIRone (BUSPAR) 15 MG tablet Take 30 mg by mouth 2 (two) times daily.   . cholecalciferol (VITAMIN D) 1000 UNITS tablet Take 1,000 Units by mouth daily.  . clopidogrel (PLAVIX) 75 MG tablet Take 1 tablet (75 mg total) by mouth daily.  .Marland Kitchendonepezil (ARICEPT) 5  MG tablet Take 1 tablet (5 mg total) by mouth at bedtime.  Regino Schultze Bandages & Supports (ABDOMINAL BINDER/ELASTIC MED) MISC Apply binder each day  . Elastic Bandages & Supports (MEDICAL COMPRESSION STOCKINGS) MISC 2 Devices by Does not apply route daily.  . furosemide (LASIX) 20 MG tablet Take 20 mg by mouth 2 (two) times daily.  [name] gabapentin (NEURONTIN) 100 MG capsule Take 100 mg by mouth 3 (three) times daily.  [name] lamoTRIgine (LAMICTAL) 200 MG tablet Take 200 mg by mouth 2 (two) times daily.   [name] levocetirizine (XYZAL) 5 MG tablet Take 5 mg by mouth every evening.  [name] lisinopril-hydrochlorothiazide (PRINZIDE,ZESTORETIC) 20-12.5 MG per tablet Take 1 tablet by mouth daily.   . meloxicam (MOBIC) 7.5 MG tablet Take 7.5 mg by mouth daily.  . metFORMIN (GLUCOPHAGE) 1000 MG tablet Take 1,000 mg by mouth 2 (two) times daily with a meal.  . metoprolol succinate (TOPROL XL) 50 MG 24 hr tablet Take 1 tablet (50 mg total) by mouth daily.  [name] omeprazole (PRILOSEC) 20 MG capsule Take 20 mg by mouth daily.  [name] oxyCODONE-acetaminophen (PERCOCET/ROXICET) 5-325 MG  tablet Take 1 tablet by mouth every 6 (six) hours as needed for up to 8 doses for moderate pain.  . OXYGEN Inhale 2 L into the lungs.  . simvastatin (ZOCOR) 20 MG tablet Take 2 tablets daily (Patient taking differently: Take 40 mg by mouth daily at 6 PM. )  . venlafaxine XR (EFFEXOR-XR) 150 MG 24 hr capsule Take total of 187.5 (150+37.5) mg daily  . venlafaxine XR (EFFEXOR-XR) 37.5 MG 24 hr capsule Take total of 187.5 mg daily (150 mg + 37.5 mg)   No facility-administered encounter medications on file as of 07/15/2017.      ALLERGIES: No Known Allergies  FAMILY HISTORY: Family History  Problem Relation Age of Onset  . Heart disease Mother   . Diabetes Mother   . Heart disease Brother   . Cancer Sister   . Heart disease Brother   . Heart disease Brother   . Heart disease Son     SOCIAL HISTORY: Social History   Socioeconomic History  . Marital status: Married    Spouse name: Not on file  . Number of children: Not on file  . Years of education: Not on file  . Highest education level: Not on file  Occupational History  . Not on file  Social Needs  . Financial resource strain: Not on file  . Food insecurity:    Worry: Not on file    Inability: Not on file  . Transportation needs:    Medical: Not on file    Non-medical: Not on file  Tobacco Use  . Smoking status: Never Smoker  . Smokeless tobacco: Never Used  Substance and Sexual Activity  . Alcohol use: No    Alcohol/week: 0.0 oz  . Drug use: No  . Sexual activity: Not on file  Lifestyle  . Physical activity:    Days per week: Not on file    Minutes per session: Not on file  . Stress: Not on file  Relationships  . Social connections:    Talks on phone: Not on file    Gets together: Not on file    Attends religious service: Not on file    Active member of club or organization: Not on file    Attends meetings of clubs or organizations: Not on file    Relationship status: Not on file  . Intimate partner  violence:    Fear of current or ex partner: Not on file    Emotionally abused: Not on file    Physically abused: Not on file    Forced sexual activity: Not on file  Other Topics Concern  . Not on file  Social History Narrative  . Not on file    REVIEW OF SYSTEMS: Constitutional: No fevers, chills, or sweats, no generalized fatigue, change in appetite Eyes: No visual changes, double vision, eye pain Ear, nose and throat: No hearing loss, ear pain, nasal congestion, sore throat Cardiovascular: No chest pain, palpitations Respiratory:  No shortness of breath at rest or with exertion, wheezes GastrointestinaI: No nausea, vomiting, diarrhea, abdominal pain, fecal incontinence Genitourinary:  No dysuria, urinary retention or frequency Musculoskeletal:  No neck pain, back pain Integumentary: No rash, pruritus, skin lesions Neurological: as above Psychiatric: + depression, no insomnia, + anxiety Endocrine: No palpitations, fatigue, diaphoresis, mood swings, change in appetite, change in weight, increased thirst Hematologic/Lymphatic:  No anemia, purpura, petechiae. Allergic/Immunologic: no itchy/runny eyes, nasal congestion, recent allergic reactions, rashes  PHYSICAL EXAM: Vitals:   07/15/17 1518  BP: 114/62  Pulse: 73  SpO2: 93%   General: No acute distress, visibly more energetic in office today Head:  Normocephalic/atraumatic Neck: supple, no paraspinal tenderness, full range of motion. Well-healing scar on right side of neck Heart:  Regular rate and rhythm Lungs:  Clear to auscultation bilaterally Back: No paraspinal tenderness Skin/Extremities: No rash, no edema Neurological Exam: alert and oriented to person, place, and time. No aphasia or dysarthria, voice hoarse (similar to prior). Fund of knowledge is appropriate.  Recent and remote memory are intact.Attention and concentration are normal.    Able to name objects and repeat phrases. Cranial nerves: Pupils equal, round,  reactive to light.  Extraocular movements intact with no nystagmus. Visual fields full. Facial sensation intact. No facial asymmetry. Tongue, uvula, palate midline.  Motor: Bulk and tone normal, muscle strength 5/5 throughout with no pronator drift.  Sensation to light touch, pin, vibration intact.  No extinction to double simultaneous stimulation.  Deep tendon reflexes: +2 on both UE, unable to elicit right patella reflex, +2 on left patella, both ankles, toes downgoing.  Finger to nose testing intact.  Gait narrow-based and steady without ataxia  IMPRESSION: This is a 67 yo RH woman with a history of diabetes, hyperlipidemia, headaches, and history of non-epileptic episodes of decreased responsiveness, who initially presented with episodes of feeling off balance with slurred speech. MRI brain and 24-hour EEG unremarkable. No further episodes of decreased responsiveness. Headaches are fairly controlled on amitriptyline 65m qhs. She had presented I April 2019 with new symptoms of leg shaking when standing, mostly affecting the left side. At one point, the left side of her face was drawn. MRI brain no acute changes. She had orthostatic hypotension noted and has stopped BP medications except Lasix, and CTA neck showed critical stenosis of the right proximal ICA. She is now s/p right CEA and doing much better without any further episodes of shaking or falls. Neurological exam normal. No further episodes of aphasia and left leg weakness that occurred transiently post-op. Continue aspirin and Plavix, control of vascular risk factors. Follow-up with Dr. BTrula Sladeas scheduled. She will follow-up in 6 months, they know to call for any changes.   Thank you for allowing me to participate in her care.  Please do not hesitate to call for any questions or concerns.  The duration of this appointment visit was 25 minutes  of face-to-face time with the patient.  Greater than 50% of this time was spent in counseling,  explanation of diagnosis, planning of further management, and coordination of care.   Ellouise Newer, M.D.   CC: Dr. Sherrie Sport, Dr. Trula Slade

## 2017-07-18 ENCOUNTER — Ambulatory Visit: Payer: Self-pay | Admitting: *Deleted

## 2017-07-21 ENCOUNTER — Other Ambulatory Visit: Payer: Self-pay

## 2017-07-21 ENCOUNTER — Ambulatory Visit (INDEPENDENT_AMBULATORY_CARE_PROVIDER_SITE_OTHER): Payer: PPO | Admitting: Surgery

## 2017-07-21 ENCOUNTER — Encounter: Payer: Self-pay | Admitting: Surgery

## 2017-07-21 VITALS — BP 122/74 | HR 106 | Temp 98.1°F | Resp 20 | Ht 64.0 in | Wt 175.0 lb

## 2017-07-21 DIAGNOSIS — I70211 Atherosclerosis of native arteries of extremities with intermittent claudication, right leg: Secondary | ICD-10-CM

## 2017-07-21 NOTE — Progress Notes (Signed)
Patient name: Carol Richmond MRN: [id number] DOB: Feb 24, 1950 Sex: female  REASON FOR VISIT:     post op  HISTORY OF PRESENT ILLNESS:   Carol Richmond is a 67 y.o. female wversus a TIA.  Ho I saw for evaluation of right carotid stenosis.  It was unclear whether she was having symptoms of facial droop and left arm weakness because of symptomatic orthostatic hypotension she had an ultrasound that showed greater than 80% right carotid stenosis.  I felt her symptoms were more consistent with TIAs and therefore I took her to the operating room the following day for right carotid endarterectomy.  A 99% stenosis was identified.  She did well initially however early the following morning she began slurring her words.  She was taken to the operating room by Dr. Scot Dock for exploration.  He found that the endarterectomy site was widely patent.  A CT angiogram was then performed which showed that the endarterectomy site was widely patent.  I spoke with the stroke team and they felt continuing medical therapy with follow-up of her neurologist next week would be warranted.  Patient is back today without having had any additional symptoms.  CURRENT MEDICATIONS:    Current Outpatient Medications  Medication Sig Dispense Refill  . acyclovir (ZOVIRAX) 800 MG tablet Take 800 mg by mouth daily.     [name] albuterol (PROVENTIL HFA;VENTOLIN HFA) 108 (90 BASE) MCG/ACT inhaler Inhale 2 puffs into the lungs every 6 (six) hours as needed for wheezing or shortness of breath. 1 Inhaler 3  . amitriptyline (ELAVIL) 50 MG tablet Take 1 tablet (50 mg total) by mouth at bedtime. 90 tablet 3  . aspirin EC 81 MG tablet Take 81 mg by mouth daily.     . busPIRone (BUSPAR) 15 MG tablet Take 30 mg by mouth 2 (two) times daily.     . cholecalciferol (VITAMIN D) 1000 UNITS tablet Take 1,000 Units by mouth daily.    . clopidogrel (PLAVIX) 75 MG tablet Take 1 tablet (75 mg total) by mouth daily.  30 tablet 11  . donepezil (ARICEPT) 5 MG tablet Take 1 tablet (5 mg total) by mouth at bedtime. 90 tablet 0  . Elastic Bandages & Supports (ABDOMINAL BINDER/ELASTIC MED) MISC Apply binder each day 1 each 0  . Elastic Bandages & Supports (MEDICAL COMPRESSION STOCKINGS) MISC 2 Devices by Does not apply route daily. 2 each 0  . furosemide (LASIX) 20 MG tablet Take 20 mg by mouth 2 (two) times daily.    [name] gabapentin (NEURONTIN) 100 MG capsule Take 100 mg by mouth 3 (three) times daily.    [name] lamoTRIgine (LAMICTAL) 200 MG tablet Take 200 mg by mouth 2 (two) times daily.     [name] levocetirizine (XYZAL) 5 MG tablet Take 5 mg by mouth every evening.    [name] lisinopril-hydrochlorothiazide (PRINZIDE,ZESTORETIC) 20-12.5 MG per tablet Take 1 tablet by mouth daily.     . meloxicam (MOBIC) 7.5 MG tablet Take 7.5 mg by mouth daily.    . metFORMIN (GLUCOPHAGE) 1000 MG tablet Take 1,000 mg by mouth 2 (two) times daily with a meal.    . metoprolol succinate (TOPROL XL) 50 MG 24 hr tablet Take 1 tablet (50 mg total) by mouth daily. 30 tablet 6  . omeprazole (PRILOSEC) 20 MG capsule Take 20 mg by mouth daily.    [name] oxyCODONE-acetaminophen (PERCOCET/ROXICET) 5-325 MG tablet Take 1 tablet by mouth every 6 (six) hours as needed for up to 8 doses for  moderate pain. 8 tablet 0  . OXYGEN Inhale 2 L into the lungs.    . simvastatin (ZOCOR) 20 MG tablet Take 2 tablets daily (Patient taking differently: Take 40 mg by mouth daily at 6 PM. ) 60 tablet 6  . venlafaxine XR (EFFEXOR-XR) 150 MG 24 hr capsule Take total of 187.5 (150+37.5) mg daily 90 capsule 0  . venlafaxine XR (EFFEXOR-XR) 37.5 MG 24 hr capsule Take total of 187.5 mg daily (150 mg + 37.5 mg) 90 capsule 0   No current facility-administered medications for this visit.     REVIEW OF SYSTEMS:   [X]  denotes positive finding, [ ]  denotes negative finding Cardiac  Comments:  Chest pain or chest pressure:    Shortness of breath upon exertion:    Short of breath when  lying flat:    Irregular heart rhythm:    Constitutional    Fever or chills:      PHYSICAL EXAM:   Vitals:   07/21/17 1559 07/21/17 1601  BP: 115/73 122/74  Pulse: (!) 106   Resp: 20   Temp: 98.1 F (36.7 C)   TempSrc: Oral   SpO2: 94%   Weight: 175 lb (79.4 kg)   Height: 5' 4"  (1.626 m)     GENERAL: The patient is a well-nourished female, in no acute distress. The vital signs are documented above. CARDIOVASCULAR: There is a regular rate and rhythm. PULMONARY: Non-labored respirations Carotid incision is well-healed.  Tongue is midline.  STUDIES:   None   MEDICAL ISSUES:   Status post right carotid endarterectomy: The patient is asymptomatic.  I would recommend continuing dual antiplatelet therapy for another 3-6 months.  I will have her follow-up with me in 6 months for repeat carotid duplex.  Annamarie Major, MD [facility] [phone] Pager [phone]

## 2017-07-24 DIAGNOSIS — Z1389 Encounter for screening for other disorder: Secondary | ICD-10-CM | POA: Diagnosis not present

## 2017-07-24 DIAGNOSIS — Z0000 Encounter for general adult medical examination without abnormal findings: Secondary | ICD-10-CM | POA: Diagnosis not present

## 2017-07-24 DIAGNOSIS — I5031 Acute diastolic (congestive) heart failure: Secondary | ICD-10-CM | POA: Diagnosis not present

## 2017-07-24 DIAGNOSIS — Z6830 Body mass index (BMI) 30.0-30.9, adult: Secondary | ICD-10-CM | POA: Diagnosis not present

## 2017-08-01 ENCOUNTER — Other Ambulatory Visit: Payer: Self-pay

## 2017-08-01 DIAGNOSIS — I6521 Occlusion and stenosis of right carotid artery: Secondary | ICD-10-CM

## 2017-08-04 DIAGNOSIS — Z7982 Long term (current) use of aspirin: Secondary | ICD-10-CM | POA: Diagnosis not present

## 2017-08-04 DIAGNOSIS — N179 Acute kidney failure, unspecified: Secondary | ICD-10-CM | POA: Diagnosis not present

## 2017-08-04 DIAGNOSIS — Z6828 Body mass index (BMI) 28.0-28.9, adult: Secondary | ICD-10-CM | POA: Diagnosis not present

## 2017-08-04 DIAGNOSIS — E7220 Disorder of urea cycle metabolism, unspecified: Secondary | ICD-10-CM | POA: Diagnosis not present

## 2017-08-04 DIAGNOSIS — R0789 Other chest pain: Secondary | ICD-10-CM | POA: Diagnosis not present

## 2017-08-04 DIAGNOSIS — R0602 Shortness of breath: Secondary | ICD-10-CM | POA: Diagnosis not present

## 2017-08-04 DIAGNOSIS — E669 Obesity, unspecified: Secondary | ICD-10-CM | POA: Diagnosis not present

## 2017-08-04 DIAGNOSIS — F319 Bipolar disorder, unspecified: Secondary | ICD-10-CM | POA: Diagnosis not present

## 2017-08-04 DIAGNOSIS — F0390 Unspecified dementia without behavioral disturbance: Secondary | ICD-10-CM | POA: Diagnosis not present

## 2017-08-04 DIAGNOSIS — J90 Pleural effusion, not elsewhere classified: Secondary | ICD-10-CM | POA: Diagnosis not present

## 2017-08-04 DIAGNOSIS — D6489 Other specified anemias: Secondary | ICD-10-CM | POA: Diagnosis not present

## 2017-08-04 DIAGNOSIS — F419 Anxiety disorder, unspecified: Secondary | ICD-10-CM | POA: Diagnosis not present

## 2017-08-04 DIAGNOSIS — E785 Hyperlipidemia, unspecified: Secondary | ICD-10-CM | POA: Diagnosis not present

## 2017-08-04 DIAGNOSIS — E119 Type 2 diabetes mellitus without complications: Secondary | ICD-10-CM | POA: Diagnosis not present

## 2017-08-04 DIAGNOSIS — Z7984 Long term (current) use of oral hypoglycemic drugs: Secondary | ICD-10-CM | POA: Diagnosis not present

## 2017-08-04 DIAGNOSIS — K7460 Unspecified cirrhosis of liver: Secondary | ICD-10-CM | POA: Diagnosis not present

## 2017-08-04 DIAGNOSIS — J918 Pleural effusion in other conditions classified elsewhere: Secondary | ICD-10-CM | POA: Diagnosis not present

## 2017-08-04 DIAGNOSIS — R110 Nausea: Secondary | ICD-10-CM | POA: Diagnosis not present

## 2017-08-04 DIAGNOSIS — I959 Hypotension, unspecified: Secondary | ICD-10-CM | POA: Diagnosis not present

## 2017-08-04 DIAGNOSIS — R4182 Altered mental status, unspecified: Secondary | ICD-10-CM | POA: Diagnosis not present

## 2017-08-04 DIAGNOSIS — I081 Rheumatic disorders of both mitral and tricuspid valves: Secondary | ICD-10-CM | POA: Diagnosis not present

## 2017-08-04 DIAGNOSIS — K7290 Hepatic failure, unspecified without coma: Secondary | ICD-10-CM | POA: Diagnosis not present

## 2017-08-04 DIAGNOSIS — I110 Hypertensive heart disease with heart failure: Secondary | ICD-10-CM | POA: Diagnosis not present

## 2017-08-04 DIAGNOSIS — K219 Gastro-esophageal reflux disease without esophagitis: Secondary | ICD-10-CM | POA: Diagnosis not present

## 2017-08-04 DIAGNOSIS — R0902 Hypoxemia: Secondary | ICD-10-CM | POA: Diagnosis not present

## 2017-08-04 DIAGNOSIS — I5021 Acute systolic (congestive) heart failure: Secondary | ICD-10-CM | POA: Diagnosis not present

## 2017-08-04 DIAGNOSIS — Z9981 Dependence on supplemental oxygen: Secondary | ICD-10-CM | POA: Diagnosis not present

## 2017-08-04 DIAGNOSIS — D649 Anemia, unspecified: Secondary | ICD-10-CM | POA: Diagnosis not present

## 2017-08-04 DIAGNOSIS — N178 Other acute kidney failure: Secondary | ICD-10-CM | POA: Diagnosis not present

## 2017-08-04 DIAGNOSIS — J9601 Acute respiratory failure with hypoxia: Secondary | ICD-10-CM | POA: Diagnosis not present

## 2017-08-04 DIAGNOSIS — R188 Other ascites: Secondary | ICD-10-CM | POA: Diagnosis not present

## 2017-08-04 DIAGNOSIS — I4581 Long QT syndrome: Secondary | ICD-10-CM | POA: Diagnosis not present

## 2017-08-04 DIAGNOSIS — R400 Somnolence: Secondary | ICD-10-CM | POA: Diagnosis not present

## 2017-08-07 DIAGNOSIS — I110 Hypertensive heart disease with heart failure: Secondary | ICD-10-CM | POA: Diagnosis not present

## 2017-08-07 DIAGNOSIS — E1165 Type 2 diabetes mellitus with hyperglycemia: Secondary | ICD-10-CM | POA: Diagnosis not present

## 2017-08-07 DIAGNOSIS — R932 Abnormal findings on diagnostic imaging of liver and biliary tract: Secondary | ICD-10-CM | POA: Diagnosis not present

## 2017-08-07 DIAGNOSIS — Z9981 Dependence on supplemental oxygen: Secondary | ICD-10-CM | POA: Diagnosis not present

## 2017-08-07 DIAGNOSIS — R410 Disorientation, unspecified: Secondary | ICD-10-CM | POA: Diagnosis not present

## 2017-08-07 DIAGNOSIS — I429 Cardiomyopathy, unspecified: Secondary | ICD-10-CM | POA: Diagnosis not present

## 2017-08-07 DIAGNOSIS — Z7982 Long term (current) use of aspirin: Secondary | ICD-10-CM | POA: Diagnosis not present

## 2017-08-07 DIAGNOSIS — D649 Anemia, unspecified: Secondary | ICD-10-CM | POA: Diagnosis not present

## 2017-08-07 DIAGNOSIS — B961 Klebsiella pneumoniae [K. pneumoniae] as the cause of diseases classified elsewhere: Secondary | ICD-10-CM | POA: Diagnosis not present

## 2017-08-07 DIAGNOSIS — E1151 Type 2 diabetes mellitus with diabetic peripheral angiopathy without gangrene: Secondary | ICD-10-CM | POA: Diagnosis not present

## 2017-08-07 DIAGNOSIS — N179 Acute kidney failure, unspecified: Secondary | ICD-10-CM | POA: Diagnosis not present

## 2017-08-07 DIAGNOSIS — I739 Peripheral vascular disease, unspecified: Secondary | ICD-10-CM | POA: Insufficient documentation

## 2017-08-07 DIAGNOSIS — Z79899 Other long term (current) drug therapy: Secondary | ICD-10-CM | POA: Diagnosis not present

## 2017-08-07 DIAGNOSIS — I509 Heart failure, unspecified: Secondary | ICD-10-CM | POA: Diagnosis not present

## 2017-08-07 DIAGNOSIS — K219 Gastro-esophageal reflux disease without esophagitis: Secondary | ICD-10-CM | POA: Diagnosis not present

## 2017-08-07 DIAGNOSIS — G9340 Encephalopathy, unspecified: Secondary | ICD-10-CM | POA: Diagnosis not present

## 2017-08-07 DIAGNOSIS — N178 Other acute kidney failure: Secondary | ICD-10-CM | POA: Diagnosis not present

## 2017-08-07 DIAGNOSIS — G92 Toxic encephalopathy: Secondary | ICD-10-CM | POA: Diagnosis not present

## 2017-08-07 DIAGNOSIS — Z7984 Long term (current) use of oral hypoglycemic drugs: Secondary | ICD-10-CM | POA: Diagnosis not present

## 2017-08-07 DIAGNOSIS — J90 Pleural effusion, not elsewhere classified: Secondary | ICD-10-CM | POA: Diagnosis not present

## 2017-08-07 DIAGNOSIS — R4182 Altered mental status, unspecified: Secondary | ICD-10-CM | POA: Diagnosis not present

## 2017-08-07 DIAGNOSIS — Z7902 Long term (current) use of antithrombotics/antiplatelets: Secondary | ICD-10-CM | POA: Diagnosis not present

## 2017-08-07 DIAGNOSIS — I5021 Acute systolic (congestive) heart failure: Secondary | ICD-10-CM | POA: Diagnosis not present

## 2017-08-07 DIAGNOSIS — R918 Other nonspecific abnormal finding of lung field: Secondary | ICD-10-CM | POA: Diagnosis not present

## 2017-08-07 DIAGNOSIS — I340 Nonrheumatic mitral (valve) insufficiency: Secondary | ICD-10-CM | POA: Diagnosis not present

## 2017-08-07 DIAGNOSIS — I959 Hypotension, unspecified: Secondary | ICD-10-CM | POA: Diagnosis not present

## 2017-08-07 DIAGNOSIS — J918 Pleural effusion in other conditions classified elsewhere: Secondary | ICD-10-CM | POA: Diagnosis not present

## 2017-08-07 DIAGNOSIS — E785 Hyperlipidemia, unspecified: Secondary | ICD-10-CM | POA: Diagnosis not present

## 2017-08-07 DIAGNOSIS — I361 Nonrheumatic tricuspid (valve) insufficiency: Secondary | ICD-10-CM | POA: Diagnosis not present

## 2017-08-07 DIAGNOSIS — J9601 Acute respiratory failure with hypoxia: Secondary | ICD-10-CM | POA: Diagnosis not present

## 2017-08-07 DIAGNOSIS — N3000 Acute cystitis without hematuria: Secondary | ICD-10-CM | POA: Diagnosis not present

## 2017-08-07 DIAGNOSIS — K7460 Unspecified cirrhosis of liver: Secondary | ICD-10-CM | POA: Diagnosis not present

## 2017-08-07 DIAGNOSIS — R188 Other ascites: Secondary | ICD-10-CM | POA: Diagnosis not present

## 2017-08-07 HISTORY — DX: Type 2 diabetes mellitus with hyperglycemia: E11.65

## 2017-08-13 ENCOUNTER — Other Ambulatory Visit: Payer: Self-pay

## 2017-08-13 DIAGNOSIS — I340 Nonrheumatic mitral (valve) insufficiency: Secondary | ICD-10-CM | POA: Diagnosis not present

## 2017-08-13 DIAGNOSIS — Z8673 Personal history of transient ischemic attack (TIA), and cerebral infarction without residual deficits: Secondary | ICD-10-CM | POA: Diagnosis not present

## 2017-08-13 DIAGNOSIS — E1165 Type 2 diabetes mellitus with hyperglycemia: Secondary | ICD-10-CM | POA: Diagnosis not present

## 2017-08-13 DIAGNOSIS — I5020 Unspecified systolic (congestive) heart failure: Secondary | ICD-10-CM | POA: Diagnosis not present

## 2017-08-13 DIAGNOSIS — I361 Nonrheumatic tricuspid (valve) insufficiency: Secondary | ICD-10-CM | POA: Diagnosis not present

## 2017-08-13 DIAGNOSIS — I10 Essential (primary) hypertension: Secondary | ICD-10-CM | POA: Diagnosis not present

## 2017-08-13 DIAGNOSIS — Z96659 Presence of unspecified artificial knee joint: Secondary | ICD-10-CM | POA: Diagnosis not present

## 2017-08-13 DIAGNOSIS — N3000 Acute cystitis without hematuria: Secondary | ICD-10-CM | POA: Diagnosis not present

## 2017-08-13 DIAGNOSIS — E785 Hyperlipidemia, unspecified: Secondary | ICD-10-CM | POA: Diagnosis not present

## 2017-08-13 DIAGNOSIS — J449 Chronic obstructive pulmonary disease, unspecified: Secondary | ICD-10-CM | POA: Diagnosis not present

## 2017-08-13 DIAGNOSIS — K219 Gastro-esophageal reflux disease without esophagitis: Secondary | ICD-10-CM | POA: Diagnosis not present

## 2017-08-13 DIAGNOSIS — K7469 Other cirrhosis of liver: Secondary | ICD-10-CM | POA: Diagnosis not present

## 2017-08-13 DIAGNOSIS — I429 Cardiomyopathy, unspecified: Secondary | ICD-10-CM | POA: Diagnosis not present

## 2017-08-13 DIAGNOSIS — K7460 Unspecified cirrhosis of liver: Secondary | ICD-10-CM | POA: Diagnosis not present

## 2017-08-13 DIAGNOSIS — Z7984 Long term (current) use of oral hypoglycemic drugs: Secondary | ICD-10-CM | POA: Diagnosis not present

## 2017-08-13 DIAGNOSIS — Z7982 Long term (current) use of aspirin: Secondary | ICD-10-CM | POA: Diagnosis not present

## 2017-08-13 DIAGNOSIS — F329 Major depressive disorder, single episode, unspecified: Secondary | ICD-10-CM | POA: Diagnosis not present

## 2017-08-13 DIAGNOSIS — Z7951 Long term (current) use of inhaled steroids: Secondary | ICD-10-CM | POA: Diagnosis not present

## 2017-08-13 MED ORDER — MIDAZOLAM HCL 2 MG/2ML IJ SOLN
1.00 | INTRAMUSCULAR | Status: DC
Start: ? — End: 2017-08-13

## 2017-08-13 MED ORDER — POLYETHYLENE GLYCOL 3350 17 G PO PACK
17.00 | PACK | ORAL | Status: DC
Start: ? — End: 2017-08-13

## 2017-08-13 MED ORDER — SODIUM CHLORIDE 0.9 % IV SOLN
10.00 | INTRAVENOUS | Status: DC
Start: ? — End: 2017-08-13

## 2017-08-13 MED ORDER — ALBUTEROL SULFATE (2.5 MG/3ML) 0.083% IN NEBU
2.50 | INHALATION_SOLUTION | RESPIRATORY_TRACT | Status: DC
Start: ? — End: 2017-08-13

## 2017-08-13 MED ORDER — CARVEDILOL 3.125 MG PO TABS
3.13 | ORAL_TABLET | ORAL | Status: DC
Start: 2017-08-11 — End: 2017-08-13

## 2017-08-13 MED ORDER — HEPARIN SODIUM (PORCINE) 5000 UNIT/ML IJ SOLN
5000.00 | INTRAMUSCULAR | Status: DC
Start: 2017-08-11 — End: 2017-08-13

## 2017-08-13 MED ORDER — ASPIRIN 81 MG PO CHEW
81.00 | CHEWABLE_TABLET | ORAL | Status: DC
Start: 2017-08-12 — End: 2017-08-13

## 2017-08-13 MED ORDER — GUAIFENESIN 100 MG/5ML PO SYRP
200.00 | ORAL_SOLUTION | ORAL | Status: DC
Start: ? — End: 2017-08-13

## 2017-08-13 MED ORDER — GENERIC EXTERNAL MEDICATION
1.00 | Status: DC
Start: 2017-08-12 — End: 2017-08-13

## 2017-08-13 MED ORDER — ACETAMINOPHEN 325 MG PO TABS
650.00 | ORAL_TABLET | ORAL | Status: DC
Start: ? — End: 2017-08-13

## 2017-08-13 MED ORDER — GENERIC EXTERNAL MEDICATION
Status: DC
Start: ? — End: 2017-08-13

## 2017-08-13 MED ORDER — NITROGLYCERIN 0.4 MG SL SUBL
0.40 | SUBLINGUAL_TABLET | SUBLINGUAL | Status: DC
Start: ? — End: 2017-08-13

## 2017-08-13 MED ORDER — FUROSEMIDE 20 MG PO TABS
20.00 | ORAL_TABLET | ORAL | Status: DC
Start: 2017-08-12 — End: 2017-08-13

## 2017-08-13 MED ORDER — PRAVASTATIN SODIUM 40 MG PO TABS
40.00 | ORAL_TABLET | ORAL | Status: DC
Start: 2017-08-11 — End: 2017-08-13

## 2017-08-13 MED ORDER — LISINOPRIL 2.5 MG PO TABS
5.00 | ORAL_TABLET | ORAL | Status: DC
Start: 2017-08-12 — End: 2017-08-13

## 2017-08-13 NOTE — Patient Outreach (Signed)
[facility]) Care Management  08/13/2017  Carol Richmond 1950/09/03 [id number]   67 year old female outreached by [place] for 30 day post discharge medication review.  PMHx includes, but not limited to, hypertension, h/o carotid endarterectomy in 5/19,  migraine, GERD, irritable bowel syndrome, seizures and hepatic cirrhosis.  Unsuccessful outreach attempt #1 to [name].  Left HIPAA compliant voice message requesting a return call.  Plan: Outreach attempt #2 in 3-4 business days.  Joetta Manners, PharmD Clinical Pharmacist [place] [phone]

## 2017-08-13 NOTE — Patient Outreach (Signed)
[facility]) Care Management  08/13/2017  Carol Richmond Aug 26, 1950 [id number]  Transition of care  Referral date: 08/13/17 Referral source: discharged from an inpatient admission from [facility] on 08/11/17. Insurance: [employer] advantage  Transition of care will be completed by primary care provider office who will refer to [facility] care management if needed.   PLAN: RNCM will close patient due to patient being enrolled in an external program.   Quinn Plowman RN,BSN,CCM [facility] Telephonic  [phone]

## 2017-08-15 ENCOUNTER — Emergency Department (HOSPITAL_COMMUNITY)
Admission: EM | Admit: 2017-08-15 | Discharge: 2017-08-16 | Disposition: A | Discharge status: Home / Self Care | Payer: PPO | Attending: Emergency Medicine | Admitting: Emergency Medicine

## 2017-08-15 ENCOUNTER — Encounter (HOSPITAL_COMMUNITY): Payer: Self-pay | Admitting: Emergency Medicine

## 2017-08-15 ENCOUNTER — Other Ambulatory Visit: Payer: Self-pay

## 2017-08-15 DIAGNOSIS — R531 Weakness: Secondary | ICD-10-CM | POA: Diagnosis not present

## 2017-08-15 DIAGNOSIS — Z7982 Long term (current) use of aspirin: Secondary | ICD-10-CM | POA: Diagnosis not present

## 2017-08-15 DIAGNOSIS — R456 Violent behavior: Secondary | ICD-10-CM | POA: Diagnosis not present

## 2017-08-15 DIAGNOSIS — R410 Disorientation, unspecified: Secondary | ICD-10-CM | POA: Diagnosis not present

## 2017-08-15 DIAGNOSIS — R402441 Other coma, without documented Glasgow coma scale score, or with partial score reported, in the field [EMT or ambulance]: Secondary | ICD-10-CM | POA: Diagnosis not present

## 2017-08-15 DIAGNOSIS — R5381 Other malaise: Secondary | ICD-10-CM | POA: Insufficient documentation

## 2017-08-15 DIAGNOSIS — F4489 Other dissociative and conversion disorders: Secondary | ICD-10-CM | POA: Diagnosis not present

## 2017-08-15 DIAGNOSIS — Z96651 Presence of right artificial knee joint: Secondary | ICD-10-CM | POA: Insufficient documentation

## 2017-08-15 DIAGNOSIS — R4182 Altered mental status, unspecified: Secondary | ICD-10-CM | POA: Diagnosis not present

## 2017-08-15 DIAGNOSIS — J45909 Unspecified asthma, uncomplicated: Secondary | ICD-10-CM | POA: Insufficient documentation

## 2017-08-15 DIAGNOSIS — Z79899 Other long term (current) drug therapy: Secondary | ICD-10-CM | POA: Diagnosis not present

## 2017-08-15 DIAGNOSIS — I10 Essential (primary) hypertension: Secondary | ICD-10-CM | POA: Insufficient documentation

## 2017-08-15 LAB — COMPREHENSIVE METABOLIC PANEL
ALT: 13 U/L (ref 0–44)
AST: 35 U/L (ref 15–41)
Albumin: 3.7 g/dL (ref 3.5–5.0)
Alkaline Phosphatase: 79 U/L (ref 38–126)
Anion gap: 12 (ref 5–15)
BUN: 13 mg/dL (ref 8–23)
CO2: 29 mmol/L (ref 22–32)
Calcium: 8.6 mg/dL — ABNORMAL LOW (ref 8.9–10.3)
Chloride: 101 mmol/L (ref 98–111)
Creatinine, Ser: 0.87 mg/dL (ref 0.44–1.00)
GFR calc Af Amer: >60 mL/min (ref >60)
GFR calc non Af Amer: >60 mL/min (ref >60)
Glucose, Bld: 129 mg/dL — ABNORMAL HIGH (ref 70–99)
Potassium: 3.2 mmol/L — ABNORMAL LOW (ref 3.5–5.1)
Sodium: 142 mmol/L (ref 135–145)
Total Bilirubin: 1 mg/dL (ref 0.3–1.2)
Total Protein: 7.3 g/dL (ref 6.5–8.1)

## 2017-08-15 LAB — CBC WITH DIFFERENTIAL/PLATELET
Basophils Absolute: 0.1 10*3/uL (ref 0.0–0.1)
Basophils Relative: 0 %
Eosinophils Absolute: 0.2 10*3/uL (ref 0.0–0.7)
Eosinophils Relative: 2 %
HCT: 36.1 % (ref 36.0–46.0)
Hemoglobin: 11.1 g/dL — ABNORMAL LOW (ref 12.0–15.0)
Lymphocytes Relative: 17 %
Lymphs Abs: 1.9 10*3/uL (ref 0.7–4.0)
MCH: 23.4 pg — ABNORMAL LOW (ref 26.0–34.0)
MCHC: 30.7 g/dL (ref 30.0–36.0)
MCV: 76 fL — ABNORMAL LOW (ref 78.0–100.0)
Monocytes Absolute: 1.3 10*3/uL — ABNORMAL HIGH (ref 0.1–1.0)
Monocytes Relative: 12 %
Neutro Abs: 7.7 10*3/uL (ref 1.7–7.7)
Neutrophils Relative %: 69 %
Platelets: 217 10*3/uL (ref 150–400)
RBC: 4.75 MIL/uL (ref 3.87–5.11)
RDW: 24.8 % — ABNORMAL HIGH (ref 11.5–15.5)
WBC: 11.2 10*3/uL — ABNORMAL HIGH (ref 4.0–10.5)

## 2017-08-15 LAB — AMMONIA: Ammonia: 47 umol/L — ABNORMAL HIGH (ref 9–35)

## 2017-08-15 NOTE — ED Triage Notes (Signed)
Patient coming in for altered mental status. Family states that patient has been altered and disoriented over the course of today. Family states that patient was seen and admitted to [facility]. Patient was discharged from [facility] on Monday the 24 th and treated for high ammonia levels.

## 2017-08-15 NOTE — ED Provider Notes (Signed)
[facility] EMERGENCY DEPARTMENT Provider Note   CSN: [id number] Arrival date & time: 08/15/17  2008     History   Chief Complaint Chief Complaint  Patient presents with  . Altered Mental Status    HPI BRELYNN WHELLER is a 67 y.o. female.  HPI   67 year old female brought in by family for evaluation of decreased energy and some changes in her behavior.  Family reports an inpatient admission at [facility] after being at [facility] just prior to this.   She was found to be anemic was transfused 3 units of packed red blood cells.  She also had findings consistent with congestive heart failure and liver cirrhosis.  I am not completely sure why she was transferred.  Regardless her mental status improved she was discharged.  Family reports that since then she has been very tired.  Seems to lack motivation just want to get up.  Does report that she is being very mean to her family, particularly her husband.  Patient herself has no complaints.  She does tell me repeatedly that she would like to go home.  She denies any acute pain.  No respiratory complaints.  No acute urinary complaints.  No fever  Past Medical History:  Diagnosis Date  . Asthma   . Diabetes mellitus without complication ([place])   . Diarrhea   . Diverticulosis of colon (without mention of hemorrhage)   . Functional ovarian cysts   . GERD (gastroesophageal reflux disease)   . Hemorrhoids, external, without mention of complication   . Hormone replacement therapy (postmenopausal)   . Hypertension   . IBS (irritable bowel syndrome)   . Migraine   . Right knee DJD   . Seizures ([place])   . Shortness of breath   . Sleep apnea    no cpap    study 2 yrs ago    Patient Active Problem List   Diagnosis Date Noted  . Carotid artery stenosis, symptomatic, right 07/01/2017  . Stenosis of right carotid artery 06/23/2017  . Orthostatic tremor 06/23/2017  . Transient left leg weakness 08/16/2016  . Anxiety  state 07/01/2016  . Mild neurocognitive disorder 05/02/2016  . Migraine without aura and without status migrainosus, not intractable 01/27/2015  . Depression 07/25/2014  . Chronic daily headache 12/03/2013  . Transient alteration of awareness 05/25/2013  . Osteoarthritis of right knee 09/03/2012  . Right knee DJD   . IBS (irritable bowel syndrome)   . Diverticulosis of colon (without mention of hemorrhage)   . GERD (gastroesophageal reflux disease)   . Hypertension   . Hormone replacement therapy (postmenopausal)   . Migraine   . Seizures ([place])   . Hemorrhoids, external, without mention of complication   . Hemorrhoids, external, without mention of complication   . SHOULDER PAIN 08/24/2008  . IMPINGEMENT SYNDROME 08/24/2008    Past Surgical History:  Procedure Laterality Date  . CHOLECYSTECTOMY  1980  . ENDARTERECTOMY Right 07/01/2017   Procedure: ENDARTERECTOMY CAROTID RIGHT;  Surgeon: Serafina Mitchell, MD;  Location: [place];  Service: Vascular;  Laterality: Right;  . LEFT HEART CATHETERIZATION WITH CORONARY ANGIOGRAM N/A 11/18/2013   Procedure: LEFT HEART CATHETERIZATION WITH CORONARY ANGIOGRAM;  Surgeon: Burnell Blanks, MD;  Location: [facility] CATH LAB;  Service: Cardiovascular;  Laterality: N/A;  . OOPHORECTOMY     2012 Dr. Adah Perl in [place]  . PATCH ANGIOPLASTY Right 07/01/2017   Procedure: RIGHT CAROTID PATCH ANGIOPLASTY;  Surgeon: Serafina Mitchell, MD;  Location: [place];  Service: Vascular;  Laterality: Right;  . TONSILLECTOMY    . TOTAL KNEE ARTHROPLASTY Right 09/02/2012   Procedure: TOTAL KNEE ARTHROPLASTY- right;  Surgeon: Ninetta Lights, MD;  Location: [place];  Service: Orthopedics;  Laterality: Right;  . TUBAL LIGATION    . TYMPANOMASTOIDECTOMY Left 04/02/2016   Procedure: LEFT CANAL WALL DOWN TYMPANOMASTOIDECTOMY;  Surgeon: Leta Baptist, MD;  Location: [place];  Service: ENT;  Laterality: Left;  [name] VAGINAL HYSTERECTOMY  1990  . WOUND EXPLORATION Right  07/02/2017   Procedure: RIGHT NECK EXPLORATION WITH INTRAOPERATIVE ULTRASOUND;  Surgeon: Angelia Mould, MD;  Location: [facility] OR;  Service: Vascular;  Laterality: Right;     OB History   None      Home Medications    Prior to Admission medications   Medication Sig Start Date End Date Taking? Authorizing Provider  albuterol (PROVENTIL HFA;VENTOLIN HFA) 108 (90 BASE) MCG/ACT inhaler Inhale 2 puffs into the lungs every 6 (six) hours as needed for wheezing or shortness of breath. 10/11/14  Yes Herminio Commons, MD  amitriptyline (ELAVIL) 50 MG tablet Take 1 tablet (50 mg total) by mouth at bedtime. 12/02/16  Yes Cameron Sprang, MD  aspirin EC 81 MG tablet Take 81 mg by mouth daily.    Yes [provider]  busPIRone (BUSPAR) 15 MG tablet Take 30 mg by mouth 2 (two) times daily.    Yes [provider]  carvedilol (COREG) 3.125 MG tablet Take 3.125 mg by mouth 2 (two) times daily with a meal.   Yes [provider]  cephALEXin (KEFLEX) 500 MG capsule Take 500 mg by mouth 3 (three) times daily. 5 day course prescribed on 08/11/2017   Yes [provider]  cholecalciferol (VITAMIN D) 1000 UNITS tablet Take 1,000 Units by mouth daily.   Yes [provider]  clopidogrel (PLAVIX) 75 MG tablet Take 1 tablet (75 mg total) by mouth daily. 06/16/17  Yes Cameron Sprang, MD  furosemide (LASIX) 40 MG tablet Take 40 mg by mouth 2 (two) times daily.    Yes [provider]  lamoTRIgine (LAMICTAL) 200 MG tablet Take 200 mg by mouth 2 (two) times daily.    Yes [provider]  levocetirizine (XYZAL) 5 MG tablet Take 5 mg by mouth every evening.   Yes [provider]  lisinopril (PRINIVIL,ZESTRIL) 10 MG tablet Take 10 mg by mouth daily.   Yes [provider]  metFORMIN (GLUCOPHAGE) 1000 MG tablet Take 1,000 mg by mouth 2 (two) times daily with a meal.   Yes [provider]  omeprazole (PRILOSEC) 20 MG capsule Take 20 mg  by mouth daily.   Yes [provider]  ondansetron (ZOFRAN) 4 MG tablet Take 4 mg by mouth every 4 (four) hours as needed for nausea or vomiting.   Yes [provider]  OXYGEN Inhale 2 L into the lungs continuous. Mostly uses at bedtime and during the day as needed   Yes [provider]  simvastatin (ZOCOR) 20 MG tablet Take 2 tablets daily Patient taking differently: Take 40 mg by mouth daily at 6 PM.  06/16/17  Yes Cameron Sprang, MD  venlafaxine XR (EFFEXOR-XR) 150 MG 24 hr capsule Take total of 187.5 (150+37.5) mg daily Patient taking differently: Take 150 mg by mouth daily with breakfast. Take total of 187.5 (150+37.5) mg daily 11/21/16  Yes Hisada, Elie Goody, MD  venlafaxine XR (EFFEXOR-XR) 37.5 MG 24 hr capsule Take total of 187.5 mg daily (  150 mg + 37.5 mg) Patient taking differently: Take 37.5 mg by mouth daily with breakfast. Take total of 187.5 mg daily (150 mg + 37.5 mg) 11/21/16  Yes Norman Clay, MD  Elastic Bandages & Supports (ABDOMINAL BINDER/ELASTIC MED) MISC Apply binder each day 05/30/17   Cameron Sprang, MD  Elastic Bandages & Supports ([place]) Celoron 2 Devices by Does not apply route daily. 05/30/17   Cameron Sprang, MD  oxyCODONE-acetaminophen (PERCOCET/ROXICET) 5-325 MG tablet Take 1 tablet by mouth every 6 (six) hours as needed for up to 8 doses for moderate pain. Patient not taking: Reported on 08/15/2017 07/03/17   Dagoberto Ligas, PA-C    Family History Family History  Problem Relation Age of Onset  . Heart disease Mother   . Diabetes Mother   . Heart disease Brother   . Cancer Sister   . Heart disease Brother   . Heart disease Brother   . Heart disease Son     Social History Social History   Tobacco Use  . Smoking status: Never Smoker  . Smokeless tobacco: Never Used  Substance Use Topics  . Alcohol use: No    Alcohol/week: 0.0 oz  . Drug use: No     Allergies   Patient has no known allergies.   Review  of Systems Review of Systems  All systems reviewed and negative, other than as noted in HPI.  Physical Exam Updated Vital Signs BP 118/73   Pulse 93   Temp 98.1 F (36.7 C) (Oral)   Resp 20   Ht 5' 4"  (1.626 m)   Wt 79.4 kg (175 lb)   SpO2 92%   BMI 30.04 kg/m   Physical Exam  Constitutional: She appears well-developed and well-nourished. No distress.  Laying in bed. NAD.   HENT:  Head: Normocephalic and atraumatic.  Eyes: Conjunctivae are normal. Right eye exhibits no discharge. Left eye exhibits no discharge.  Neck: Neck supple.  Cardiovascular: Normal rate, regular rhythm and normal heart sounds. Exam reveals no gallop and no friction rub.  No murmur heard. Pulmonary/Chest: Effort normal and breath sounds normal. No respiratory distress.  Abdominal: Soft. She exhibits no distension. There is no tenderness.  Musculoskeletal: She exhibits no edema or tenderness.  Neurological: She is alert.  Skin: Skin is warm and dry.  Psychiatric: She has a normal mood and affect. Her behavior is normal. Thought content normal.  Nursing note and vitals reviewed.    ED Treatments / Results  Labs (all labs ordered are listed, but only abnormal results are displayed) Labs Reviewed  CBC WITH DIFFERENTIAL/PLATELET - Abnormal; Notable for the following components:      Result Value   WBC 11.2 (*)    Hemoglobin 11.1 (*)    MCV 76.0 (*)    MCH 23.4 (*)    RDW 24.8 (*)    Monocytes Absolute 1.3 (*)    All other components within normal limits  COMPREHENSIVE METABOLIC PANEL - Abnormal; Notable for the following components:   Potassium 3.2 (*)    Glucose, Bld 129 (*)    Calcium 8.6 (*)    All other components within normal limits  AMMONIA - Abnormal; Notable for the following components:   Ammonia 47 (*)    All other components within normal limits  URINALYSIS, ROUTINE W REFLEX MICROSCOPIC    EKG None  Radiology No results found.  Procedures Procedures (including critical  care time)  Medications Ordered in ED Medications - No data to  display   Initial Impression / Assessment and Plan / ED Course  I have reviewed the triage vital signs and the nursing notes.  Pertinent labs & imaging results that were available during my care of the patient were reviewed by me and considered in my medical decision making (see chart for details).  Generalized weakness. Ammonia only mildly elevated. Pt is acting appropriately. She has no complaints. She seems tired but has a nonfocal neuro exam. Reportedly AKi during hospitalization, but renal function currently ok. FU on urine although currently on keflex for UTI. If still looks infected then would change abx class.   Final Clinical Impressions(s) / ED Diagnoses   Final diagnoses:  Physical deconditioning    ED Discharge Orders    None       Virgel Manifold, MD 08/16/17 [phone]

## 2017-08-16 LAB — URINALYSIS, ROUTINE W REFLEX MICROSCOPIC
Bacteria, UA: NONE SEEN
Bilirubin Urine: NEGATIVE
Glucose, UA: NEGATIVE mg/dL
Hgb urine dipstick: NEGATIVE
Ketones, ur: NEGATIVE mg/dL
Leukocytes, UA: NEGATIVE
Nitrite: NEGATIVE
Protein, ur: 30 mg/dL — AB
Specific Gravity, Urine: 1.014 (ref 1.005–1.030)
pH: 6 (ref 5.0–8.0)

## 2017-08-16 NOTE — ED Notes (Signed)
Patient's husband signed for patient due to altered mental status. Patient dressed and is waiting for family to arrive for transportation.

## 2017-08-19 ENCOUNTER — Other Ambulatory Visit: Payer: Self-pay

## 2017-08-19 NOTE — Patient Outreach (Signed)
[place] [facility]) Care Management  08/19/2017  Carol Richmond 1951-01-13 [id number]  67 year old female outreached by [place] for 30 day post discharge medication review.  PMHx includes, but not limited to, hypertension, h/o carotid endarterectomy in 5/19,  migraine, GERD, irritable bowel syndrome, seizures and hepatic cirrhosis.  Unsuccessful outreach attempt #2 to [name].  Left HIPAA compliant voice message requesting a return call.  Plan: Outreach attempt # 3 in 3-4 business days.  Joetta Manners, PharmD Clinical Pharmacist [place] [phone]

## 2017-08-20 ENCOUNTER — Other Ambulatory Visit: Payer: Self-pay

## 2017-08-20 NOTE — Patient Outreach (Signed)
[place] [facility]) Care Management  08/20/2017  Carol Richmond Dec 13, 1950 [id number]  15year oldfemale [facility].PMHx includes, but not limited to, hypertension, h/o carotid endarterectomy in 5/19, migraine, GERD, irritable bowel syndrome, seizures and hepatic cirrhosis.  Message received from [place] who had made contact with [place] family.  Family member states he believes Ms.  Richmond is "getting ready to pass."     Virgil make no further outreach attempts to conduct a discharge medication review at this time.  Joetta Manners, PharmD Clinical Pharmacist [place] [phone]

## 2017-08-22 ENCOUNTER — Ambulatory Visit: Payer: Self-pay

## 2017-08-25 ENCOUNTER — Ambulatory Visit: Payer: Self-pay

## 2017-08-25 DIAGNOSIS — I959 Hypotension, unspecified: Secondary | ICD-10-CM | POA: Diagnosis not present

## 2017-08-25 DIAGNOSIS — K7460 Unspecified cirrhosis of liver: Secondary | ICD-10-CM | POA: Diagnosis not present

## 2017-08-25 DIAGNOSIS — M6281 Muscle weakness (generalized): Secondary | ICD-10-CM | POA: Diagnosis not present

## 2017-08-25 DIAGNOSIS — K7200 Acute and subacute hepatic failure without coma: Secondary | ICD-10-CM | POA: Diagnosis not present

## 2017-08-25 DIAGNOSIS — I5022 Chronic systolic (congestive) heart failure: Secondary | ICD-10-CM | POA: Diagnosis not present

## 2017-08-25 DIAGNOSIS — T501X5A Adverse effect of loop [high-ceiling] diuretics, initial encounter: Secondary | ICD-10-CM | POA: Diagnosis not present

## 2017-08-25 DIAGNOSIS — R41841 Cognitive communication deficit: Secondary | ICD-10-CM | POA: Diagnosis not present

## 2017-08-25 DIAGNOSIS — F329 Major depressive disorder, single episode, unspecified: Secondary | ICD-10-CM | POA: Diagnosis not present

## 2017-08-25 DIAGNOSIS — R4182 Altered mental status, unspecified: Secondary | ICD-10-CM | POA: Diagnosis not present

## 2017-08-25 DIAGNOSIS — E876 Hypokalemia: Secondary | ICD-10-CM | POA: Diagnosis not present

## 2017-08-25 DIAGNOSIS — I130 Hypertensive heart and chronic kidney disease with heart failure and stage 1 through stage 4 chronic kidney disease, or unspecified chronic kidney disease: Secondary | ICD-10-CM | POA: Diagnosis not present

## 2017-08-25 DIAGNOSIS — E785 Hyperlipidemia, unspecified: Secondary | ICD-10-CM | POA: Diagnosis not present

## 2017-08-25 DIAGNOSIS — I509 Heart failure, unspecified: Secondary | ICD-10-CM | POA: Diagnosis not present

## 2017-08-25 DIAGNOSIS — K7290 Hepatic failure, unspecified without coma: Secondary | ICD-10-CM | POA: Diagnosis not present

## 2017-08-25 DIAGNOSIS — I447 Left bundle-branch block, unspecified: Secondary | ICD-10-CM | POA: Diagnosis not present

## 2017-08-25 DIAGNOSIS — R1319 Other dysphagia: Secondary | ICD-10-CM | POA: Diagnosis not present

## 2017-08-25 DIAGNOSIS — N183 Chronic kidney disease, stage 3 (moderate): Secondary | ICD-10-CM | POA: Diagnosis not present

## 2017-08-25 DIAGNOSIS — Z79899 Other long term (current) drug therapy: Secondary | ICD-10-CM | POA: Diagnosis not present

## 2017-08-25 DIAGNOSIS — D631 Anemia in chronic kidney disease: Secondary | ICD-10-CM | POA: Diagnosis not present

## 2017-08-25 DIAGNOSIS — Z7902 Long term (current) use of antithrombotics/antiplatelets: Secondary | ICD-10-CM | POA: Diagnosis not present

## 2017-08-25 DIAGNOSIS — E7220 Disorder of urea cycle metabolism, unspecified: Secondary | ICD-10-CM | POA: Diagnosis not present

## 2017-08-25 DIAGNOSIS — Z7982 Long term (current) use of aspirin: Secondary | ICD-10-CM | POA: Diagnosis not present

## 2017-08-25 DIAGNOSIS — K219 Gastro-esophageal reflux disease without esophagitis: Secondary | ICD-10-CM | POA: Diagnosis not present

## 2017-08-25 DIAGNOSIS — K7469 Other cirrhosis of liver: Secondary | ICD-10-CM | POA: Diagnosis not present

## 2017-08-25 DIAGNOSIS — G9340 Encephalopathy, unspecified: Secondary | ICD-10-CM | POA: Diagnosis not present

## 2017-08-25 DIAGNOSIS — F0390 Unspecified dementia without behavioral disturbance: Secondary | ICD-10-CM | POA: Diagnosis not present

## 2017-08-25 DIAGNOSIS — R2689 Other abnormalities of gait and mobility: Secondary | ICD-10-CM | POA: Diagnosis not present

## 2017-08-25 DIAGNOSIS — E1122 Type 2 diabetes mellitus with diabetic chronic kidney disease: Secondary | ICD-10-CM | POA: Diagnosis not present

## 2017-08-25 DIAGNOSIS — R402441 Other coma, without documented Glasgow coma scale score, or with partial score reported, in the field [EMT or ambulance]: Secondary | ICD-10-CM | POA: Diagnosis not present

## 2017-08-25 DIAGNOSIS — Z7984 Long term (current) use of oral hypoglycemic drugs: Secondary | ICD-10-CM | POA: Diagnosis not present

## 2017-08-29 DIAGNOSIS — J449 Chronic obstructive pulmonary disease, unspecified: Secondary | ICD-10-CM | POA: Diagnosis not present

## 2017-08-29 DIAGNOSIS — E119 Type 2 diabetes mellitus without complications: Secondary | ICD-10-CM | POA: Diagnosis not present

## 2017-08-29 DIAGNOSIS — F329 Major depressive disorder, single episode, unspecified: Secondary | ICD-10-CM | POA: Diagnosis not present

## 2017-08-29 DIAGNOSIS — K7290 Hepatic failure, unspecified without coma: Secondary | ICD-10-CM | POA: Diagnosis not present

## 2017-08-29 DIAGNOSIS — E1122 Type 2 diabetes mellitus with diabetic chronic kidney disease: Secondary | ICD-10-CM | POA: Diagnosis not present

## 2017-08-29 DIAGNOSIS — T501X5A Adverse effect of loop [high-ceiling] diuretics, initial encounter: Secondary | ICD-10-CM | POA: Diagnosis not present

## 2017-08-29 DIAGNOSIS — E876 Hypokalemia: Secondary | ICD-10-CM | POA: Diagnosis not present

## 2017-08-29 DIAGNOSIS — I509 Heart failure, unspecified: Secondary | ICD-10-CM | POA: Diagnosis not present

## 2017-08-29 DIAGNOSIS — F0280 Dementia in other diseases classified elsewhere without behavioral disturbance: Secondary | ICD-10-CM | POA: Diagnosis not present

## 2017-08-29 DIAGNOSIS — M6281 Muscle weakness (generalized): Secondary | ICD-10-CM | POA: Diagnosis not present

## 2017-08-29 DIAGNOSIS — K7460 Unspecified cirrhosis of liver: Secondary | ICD-10-CM | POA: Diagnosis not present

## 2017-08-29 DIAGNOSIS — K7469 Other cirrhosis of liver: Secondary | ICD-10-CM | POA: Diagnosis not present

## 2017-08-29 DIAGNOSIS — I130 Hypertensive heart and chronic kidney disease with heart failure and stage 1 through stage 4 chronic kidney disease, or unspecified chronic kidney disease: Secondary | ICD-10-CM | POA: Diagnosis not present

## 2017-08-29 DIAGNOSIS — R2689 Other abnormalities of gait and mobility: Secondary | ICD-10-CM | POA: Diagnosis not present

## 2017-08-29 DIAGNOSIS — Z96659 Presence of unspecified artificial knee joint: Secondary | ICD-10-CM | POA: Diagnosis not present

## 2017-08-29 DIAGNOSIS — R1319 Other dysphagia: Secondary | ICD-10-CM | POA: Diagnosis not present

## 2017-08-29 DIAGNOSIS — N183 Chronic kidney disease, stage 3 (moderate): Secondary | ICD-10-CM | POA: Diagnosis not present

## 2017-08-29 DIAGNOSIS — I5022 Chronic systolic (congestive) heart failure: Secondary | ICD-10-CM | POA: Diagnosis not present

## 2017-08-29 DIAGNOSIS — K219 Gastro-esophageal reflux disease without esophagitis: Secondary | ICD-10-CM | POA: Diagnosis not present

## 2017-08-29 DIAGNOSIS — G9340 Encephalopathy, unspecified: Secondary | ICD-10-CM | POA: Diagnosis not present

## 2017-08-29 DIAGNOSIS — F0390 Unspecified dementia without behavioral disturbance: Secondary | ICD-10-CM | POA: Diagnosis not present

## 2017-08-29 DIAGNOSIS — I5020 Unspecified systolic (congestive) heart failure: Secondary | ICD-10-CM | POA: Diagnosis not present

## 2017-08-29 DIAGNOSIS — K7200 Acute and subacute hepatic failure without coma: Secondary | ICD-10-CM | POA: Diagnosis not present

## 2017-08-29 DIAGNOSIS — R41841 Cognitive communication deficit: Secondary | ICD-10-CM | POA: Diagnosis not present

## 2017-08-31 DIAGNOSIS — E119 Type 2 diabetes mellitus without complications: Secondary | ICD-10-CM | POA: Diagnosis not present

## 2017-08-31 DIAGNOSIS — K7469 Other cirrhosis of liver: Secondary | ICD-10-CM | POA: Diagnosis not present

## 2017-08-31 DIAGNOSIS — F0280 Dementia in other diseases classified elsewhere without behavioral disturbance: Secondary | ICD-10-CM | POA: Diagnosis not present

## 2017-08-31 DIAGNOSIS — N183 Chronic kidney disease, stage 3 (moderate): Secondary | ICD-10-CM | POA: Diagnosis not present

## 2017-09-02 ENCOUNTER — Ambulatory Visit: Payer: Self-pay | Admitting: Cardiovascular Disease

## 2017-09-02 DIAGNOSIS — R0989 Other specified symptoms and signs involving the circulatory and respiratory systems: Secondary | ICD-10-CM

## 2017-09-03 ENCOUNTER — Encounter: Payer: Self-pay | Admitting: Cardiovascular Disease

## 2017-09-24 ENCOUNTER — Other Ambulatory Visit: Payer: Self-pay | Admitting: *Deleted

## 2017-09-24 NOTE — Patient Outreach (Signed)
[place] [facility]) Care Management  09/24/2017  Carol Richmond 30-Jul-1950 [id number]   Transition of Care Referral   Referral Date: 09/23/17 Referral Source: [facility] urgent outreach for [facility]  Date of Admission:  Diagnosis: hepatic failure, unspecified without coma  Date of Discharge: 09/18/17 Facility: [place] and nursing care center Insurance: Health team advantage (HTA)  Outreach attempt #1 successful Patient is able to verify HIPAA Reviewed and addressed Transitional of care referral with patient Carol Richmond gave permission during the call for [facility] RN CM to speak with her husband, Carol Richmond He reports at intervals she "easily gets confused" He and Cm discussed medicare guidelines for getting new w/c with order from primary care MD.   Social: Carol Vanderhoef lives with her husband and other family members She reports she is in fair health, independent with ADLs and iADLs.   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  Medications: denies concerns with taking medications as prescribed, affording medications, side effects of medications and questions about medications   Appointments: scheduled to see primary MD, Hasanaj on Wednesday October 01 2017 Advance directives:  Consent: [facility] RN CM reviewed [facility] services with patient. Patient gave verbal consent for services. Advised patient that other post discharge calls may occur to assess how the patient is doing following the recent hospitalization. Patient voiced understanding and was appreciative of f/u call.  Plan: [facility] RN CM will close case at this time as patient has been assessed and no needs identified.    Kiowa Peifer L. Lavina Hamman, RN, BSN, CCM [facility] Telephonic Care Management Care Coordinator Direct number [phone]  Main [facility] number [phone] Fax number [phone]

## 2017-09-29 DIAGNOSIS — Z1389 Encounter for screening for other disorder: Secondary | ICD-10-CM | POA: Diagnosis not present

## 2017-09-29 DIAGNOSIS — E0821 Diabetes mellitus due to underlying condition with diabetic nephropathy: Secondary | ICD-10-CM | POA: Diagnosis not present

## 2017-09-29 DIAGNOSIS — Z0000 Encounter for general adult medical examination without abnormal findings: Secondary | ICD-10-CM | POA: Diagnosis not present

## 2017-09-29 DIAGNOSIS — E1121 Type 2 diabetes mellitus with diabetic nephropathy: Secondary | ICD-10-CM | POA: Diagnosis not present

## 2017-09-29 DIAGNOSIS — I5031 Acute diastolic (congestive) heart failure: Secondary | ICD-10-CM | POA: Diagnosis not present

## 2017-09-29 DIAGNOSIS — Z6830 Body mass index (BMI) 30.0-30.9, adult: Secondary | ICD-10-CM | POA: Diagnosis not present

## 2017-09-29 DIAGNOSIS — Z6826 Body mass index (BMI) 26.0-26.9, adult: Secondary | ICD-10-CM | POA: Diagnosis not present

## 2017-09-29 DIAGNOSIS — N182 Chronic kidney disease, stage 2 (mild): Secondary | ICD-10-CM | POA: Diagnosis not present

## 2017-09-29 DIAGNOSIS — K7689 Other specified diseases of liver: Secondary | ICD-10-CM | POA: Diagnosis not present

## 2017-10-01 DIAGNOSIS — R008 Other abnormalities of heart beat: Secondary | ICD-10-CM | POA: Diagnosis not present

## 2017-10-13 DIAGNOSIS — Z96659 Presence of unspecified artificial knee joint: Secondary | ICD-10-CM | POA: Diagnosis not present

## 2017-10-13 DIAGNOSIS — J449 Chronic obstructive pulmonary disease, unspecified: Secondary | ICD-10-CM | POA: Diagnosis not present

## 2017-10-13 DIAGNOSIS — I5020 Unspecified systolic (congestive) heart failure: Secondary | ICD-10-CM | POA: Diagnosis not present

## 2017-11-20 ENCOUNTER — Encounter

## 2017-11-20 ENCOUNTER — Ambulatory Visit: Payer: Self-pay | Admitting: Neurology

## 2017-12-02 DIAGNOSIS — K7689 Other specified diseases of liver: Secondary | ICD-10-CM | POA: Diagnosis not present

## 2017-12-02 DIAGNOSIS — E1121 Type 2 diabetes mellitus with diabetic nephropathy: Secondary | ICD-10-CM | POA: Diagnosis not present

## 2017-12-02 DIAGNOSIS — Z6823 Body mass index (BMI) 23.0-23.9, adult: Secondary | ICD-10-CM | POA: Diagnosis not present

## 2017-12-02 DIAGNOSIS — K7460 Unspecified cirrhosis of liver: Secondary | ICD-10-CM | POA: Diagnosis not present

## 2017-12-02 DIAGNOSIS — N182 Chronic kidney disease, stage 2 (mild): Secondary | ICD-10-CM | POA: Diagnosis not present

## 2017-12-02 DIAGNOSIS — Z6826 Body mass index (BMI) 26.0-26.9, adult: Secondary | ICD-10-CM | POA: Diagnosis not present

## 2017-12-02 DIAGNOSIS — Z0000 Encounter for general adult medical examination without abnormal findings: Secondary | ICD-10-CM | POA: Diagnosis not present

## 2017-12-02 DIAGNOSIS — I5031 Acute diastolic (congestive) heart failure: Secondary | ICD-10-CM | POA: Diagnosis not present

## 2017-12-05 DIAGNOSIS — H3582 Retinal ischemia: Secondary | ICD-10-CM | POA: Diagnosis not present

## 2017-12-05 DIAGNOSIS — H2511 Age-related nuclear cataract, right eye: Secondary | ICD-10-CM | POA: Diagnosis not present

## 2017-12-05 DIAGNOSIS — H348111 Central retinal vein occlusion, right eye, with retinal neovascularization: Secondary | ICD-10-CM | POA: Diagnosis not present

## 2017-12-05 DIAGNOSIS — E119 Type 2 diabetes mellitus without complications: Secondary | ICD-10-CM | POA: Diagnosis not present

## 2017-12-08 ENCOUNTER — Ambulatory Visit (INDEPENDENT_AMBULATORY_CARE_PROVIDER_SITE_OTHER): Payer: PPO | Admitting: Otolaryngology

## 2017-12-08 DIAGNOSIS — H95122 Granulation of postmastoidectomy cavity, left ear: Secondary | ICD-10-CM | POA: Diagnosis not present

## 2017-12-16 DIAGNOSIS — I5020 Unspecified systolic (congestive) heart failure: Secondary | ICD-10-CM | POA: Diagnosis not present

## 2017-12-16 DIAGNOSIS — Z96659 Presence of unspecified artificial knee joint: Secondary | ICD-10-CM | POA: Diagnosis not present

## 2017-12-16 DIAGNOSIS — J449 Chronic obstructive pulmonary disease, unspecified: Secondary | ICD-10-CM | POA: Diagnosis not present

## 2017-12-24 ENCOUNTER — Ambulatory Visit: Payer: Self-pay | Admitting: Neurology

## 2017-12-25 DIAGNOSIS — M810 Age-related osteoporosis without current pathological fracture: Secondary | ICD-10-CM | POA: Diagnosis not present

## 2017-12-25 DIAGNOSIS — E2839 Other primary ovarian failure: Secondary | ICD-10-CM | POA: Diagnosis not present

## 2017-12-29 DIAGNOSIS — Z1231 Encounter for screening mammogram for malignant neoplasm of breast: Secondary | ICD-10-CM | POA: Diagnosis not present

## 2018-01-16 DIAGNOSIS — I5020 Unspecified systolic (congestive) heart failure: Secondary | ICD-10-CM | POA: Diagnosis not present

## 2018-01-16 DIAGNOSIS — J449 Chronic obstructive pulmonary disease, unspecified: Secondary | ICD-10-CM | POA: Diagnosis not present

## 2018-01-16 DIAGNOSIS — Z96659 Presence of unspecified artificial knee joint: Secondary | ICD-10-CM | POA: Diagnosis not present

## 2018-01-20 DIAGNOSIS — R1030 Lower abdominal pain, unspecified: Secondary | ICD-10-CM | POA: Diagnosis not present

## 2018-01-20 DIAGNOSIS — Z6823 Body mass index (BMI) 23.0-23.9, adult: Secondary | ICD-10-CM | POA: Diagnosis not present

## 2018-01-26 DIAGNOSIS — H2511 Age-related nuclear cataract, right eye: Secondary | ICD-10-CM | POA: Diagnosis not present

## 2018-01-26 DIAGNOSIS — H35033 Hypertensive retinopathy, bilateral: Secondary | ICD-10-CM | POA: Diagnosis not present

## 2018-01-26 DIAGNOSIS — H2513 Age-related nuclear cataract, bilateral: Secondary | ICD-10-CM | POA: Diagnosis not present

## 2018-01-26 DIAGNOSIS — H34811 Central retinal vein occlusion, right eye, with macular edema: Secondary | ICD-10-CM | POA: Diagnosis not present

## 2018-01-26 DIAGNOSIS — H25013 Cortical age-related cataract, bilateral: Secondary | ICD-10-CM | POA: Diagnosis not present

## 2018-02-02 ENCOUNTER — Ambulatory Visit: Payer: Self-pay | Admitting: Family

## 2018-02-02 ENCOUNTER — Encounter (HOSPITAL_COMMUNITY): Payer: Self-pay

## 2018-02-03 ENCOUNTER — Encounter: Payer: Self-pay | Admitting: Family

## 2018-02-15 DIAGNOSIS — I5020 Unspecified systolic (congestive) heart failure: Secondary | ICD-10-CM | POA: Diagnosis not present

## 2018-02-15 DIAGNOSIS — Z96659 Presence of unspecified artificial knee joint: Secondary | ICD-10-CM | POA: Diagnosis not present

## 2018-02-15 DIAGNOSIS — J449 Chronic obstructive pulmonary disease, unspecified: Secondary | ICD-10-CM | POA: Diagnosis not present

## 2018-02-16 ENCOUNTER — Telehealth: Payer: Self-pay | Admitting: Cardiovascular Disease

## 2018-02-16 ENCOUNTER — Encounter: Payer: Self-pay | Admitting: *Deleted

## 2018-02-16 ENCOUNTER — Ambulatory Visit: Payer: PPO | Admitting: Cardiovascular Disease

## 2018-02-16 ENCOUNTER — Encounter: Payer: Self-pay | Admitting: Cardiovascular Disease

## 2018-02-16 VITALS — BP 107/67 | HR 98 | Ht 65.0 in | Wt 142.0 lb

## 2018-02-16 DIAGNOSIS — I38 Endocarditis, valve unspecified: Secondary | ICD-10-CM

## 2018-02-16 DIAGNOSIS — E785 Hyperlipidemia, unspecified: Secondary | ICD-10-CM | POA: Diagnosis not present

## 2018-02-16 DIAGNOSIS — Z9889 Other specified postprocedural states: Secondary | ICD-10-CM | POA: Diagnosis not present

## 2018-02-16 DIAGNOSIS — I428 Other cardiomyopathies: Secondary | ICD-10-CM

## 2018-02-16 DIAGNOSIS — I10 Essential (primary) hypertension: Secondary | ICD-10-CM | POA: Diagnosis not present

## 2018-02-16 DIAGNOSIS — I447 Left bundle-branch block, unspecified: Secondary | ICD-10-CM

## 2018-02-16 DIAGNOSIS — I5042 Chronic combined systolic (congestive) and diastolic (congestive) heart failure: Secondary | ICD-10-CM | POA: Diagnosis not present

## 2018-02-16 NOTE — Progress Notes (Signed)
SUBJECTIVE:  The patient presents for routine follow-up. Coronary angiography on 11/18/13 showed mild nonobstructive disease. She has an inappropriate sinus tachycardia, hypertension, and hyperlipidemia. She has had chest pains in the past with no relief with Ranexa.  She has a history of TIA and underwent right carotid endarterectomy earlier this year.  Echocardiogram on 07/02/2017 demonstrated moderately reduced left ventricular systolic function, LVEF 35 to 40%.  There was grade 2 diastolic dysfunction, high ventricular filling pressures, moderate aortic regurgitation, moderate diffuse thickening of the anterior and posterior mitral leaflets with mild stenosis and moderate regurgitation.  There was moderate left atrial dilatation with mild tricuspid and pulmonic regurgitation.  Pulmonary pressures were moderately elevated, 51 mmHg.  When compared to the echocardiogram in March 2018, LVEF had significantly dropped as it had been 60 to 65%.  The patient denies any symptoms of chest pain, palpitations, shortness of breath, lightheadedness, dizziness, leg swelling, orthopnea, PND, and syncope.  She wanted to discuss several of her family issues with me regarding her children.   Review of Systems: As per "subjective", otherwise negative.  No Known Allergies  Current Outpatient Medications  Medication Sig Dispense Refill  . aspirin EC 81 MG tablet Take 81 mg by mouth daily.     . carvedilol (COREG) 3.125 MG tablet Take 3.125 mg by mouth 2 (two) times daily with a meal.    . cholecalciferol (VITAMIN D) 1000 UNITS tablet Take 1,000 Units by mouth daily.    . furosemide (LASIX) 40 MG tablet Take 40 mg by mouth 2 (two) times daily.     [name] lamoTRIgine (LAMICTAL) 200 MG tablet Take 200 mg by mouth 2 (two) times daily.     [name] lisinopril (PRINIVIL,ZESTRIL) 10 MG tablet Take 10 mg by mouth daily.    [name] omeprazole (PRILOSEC) 20 MG capsule Take 20 mg by mouth daily.    . ondansetron (ZOFRAN) 4 MG  tablet Take 4 mg by mouth every 4 (four) hours as needed for nausea or vomiting.     No current facility-administered medications for this visit.     Past Medical History:  Diagnosis Date  . Asthma   . Diabetes mellitus without complication ([place])   . Diarrhea   . Diverticulosis of colon (without mention of hemorrhage)   . Functional ovarian cysts   . GERD (gastroesophageal reflux disease)   . Hemorrhoids, external, without mention of complication   . Hormone replacement therapy (postmenopausal)   . Hypertension   . IBS (irritable bowel syndrome)   . Migraine   . Right knee DJD   . Seizures ([place])   . Shortness of breath   . Sleep apnea    no cpap    study 2 yrs ago    Past Surgical History:  Procedure Laterality Date  . CHOLECYSTECTOMY  1980  . ENDARTERECTOMY Right 07/01/2017   Procedure: ENDARTERECTOMY CAROTID RIGHT;  Surgeon: Serafina Mitchell, MD;  Location: [place];  Service: Vascular;  Laterality: Right;  . LEFT HEART CATHETERIZATION WITH CORONARY ANGIOGRAM N/A 11/18/2013   Procedure: LEFT HEART CATHETERIZATION WITH CORONARY ANGIOGRAM;  Surgeon: Burnell Blanks, MD;  Location: [facility] CATH LAB;  Service: Cardiovascular;  Laterality: N/A;  . OOPHORECTOMY     2012 Dr. Adah Perl in [place]  . PATCH ANGIOPLASTY Right 07/01/2017   Procedure: RIGHT CAROTID PATCH ANGIOPLASTY;  Surgeon: Serafina Mitchell, MD;  Location: [facility] OR;  Service: Vascular;  Laterality: Right;  . TONSILLECTOMY    . TOTAL KNEE ARTHROPLASTY Right  09/02/2012   Procedure: TOTAL KNEE ARTHROPLASTY- right;  Surgeon: Ninetta Lights, MD;  Location: [place];  Service: Orthopedics;  Laterality: Right;  . TUBAL LIGATION    . TYMPANOMASTOIDECTOMY Left 04/02/2016   Procedure: LEFT CANAL WALL DOWN TYMPANOMASTOIDECTOMY;  Surgeon: Leta Baptist, MD;  Location: [place];  Service: ENT;  Laterality: Left;  [name] VAGINAL HYSTERECTOMY  1990  . WOUND EXPLORATION Right 07/02/2017   Procedure: RIGHT NECK EXPLORATION WITH  INTRAOPERATIVE ULTRASOUND;  Surgeon: Angelia Mould, MD;  [facility] OR;  Service: Vascular;  Laterality: Right;    Social History   Socioeconomic History  . Marital status: Married    Spouse name: Not on file  . Number of children: Not on file  . Years of education: Not on file  . Highest education level: Not on file  Occupational History  . Not on file  Social Needs  . Financial resource strain: Not on file  . Food insecurity:    Worry: Not on file    Inability: Not on file  . Transportation needs:    Medical: Not on file    Non-medical: Not on file  Tobacco Use  . Smoking status: Never Smoker  . Smokeless tobacco: Never Used  Substance and Sexual Activity  . Alcohol use: No    Alcohol/week: 0.0 standard drinks  . Drug use: No  . Sexual activity: Not on file  Lifestyle  . Physical activity:    Days per week: Not on file    Minutes per session: Not on file  . Stress: Not on file  Relationships  . Social connections:    Talks on phone: Not on file    Gets together: Not on file    Attends religious service: Not on file    Active member of club or organization: Not on file    Attends meetings of clubs or organizations: Not on file    Relationship status: Not on file  . Intimate partner violence:    Fear of current or ex partner: Not on file    Emotionally abused: Not on file    Physically abused: Not on file    Forced sexual activity: Not on file  Other Topics Concern  . Not on file  Social History Narrative  . Not on file     Vitals:   02/16/18 1536  BP: 107/67  Pulse: 98  SpO2: (!) 70%  Weight: 142 lb (64.4 kg)  Height: 5' 5"  (1.651 m)    Wt Readings from Last 3 Encounters:  02/16/18 142 lb (64.4 kg)  08/15/17 175 lb (79.4 kg)  07/21/17 175 lb (79.4 kg)     PHYSICAL EXAM General: NAD HEENT: Normal. Neck: No JVD, no thyromegaly. Lungs: Clear to auscultation bilaterally with normal respiratory effort. CV: Regular rate and rhythm,  normal S1/split S2, no S3/S4, no murmur. No pretibial or periankle edema.  No carotid bruits. Abdomen: Soft, nontender, no distention.  Neurologic: Alert and oriented.  Psych: Normal affect. Skin: Normal. Musculoskeletal: No gross deformities.    ECG: Reviewed above under Subjective   Labs: Lab Results  Component Value Date/Time   K 3.2 (L) 08/15/2017 09:15 PM   BUN 13 08/15/2017 09:15 PM   CREATININE 0.87 08/15/2017 09:15 PM   CREATININE 0.82 02/28/2011 10:38 AM   ALT 13 08/15/2017 09:15 PM   TSH 1.33 05/02/2016 09:28 AM   HGB 11.1 (L) 08/15/2017 09:15 PM     Lipids: Lab Results  Component Value Date/Time  [place] (H) 05/08/2007 05:30 AM    120        Total Cholesterol/HDL:CHD Risk Coronary Heart Disease Risk Table                     Men   Women  1/2 Average Risk   3.4   3.3   CHOL  05/08/2007 05:30 AM    196        ATP III CLASSIFICATION:  <200     mg/dL   Desirable  200-239  mg/dL   Borderline High  >=240    mg/dL   High   TRIG 192 (H) 05/08/2007 05:30 AM   HDL 38 (L) 05/08/2007 05:30 AM       ASSESSMENT AND PLAN: 1.  Cardiomyopathy/chronic combined systolic and diastolic heart failure: LVEF 35 to 40% in May 2019 by echocardiogram.  This represents a significant decline in LV function as LVEF had been 60 to 65% in March 2018.  I will obtain a follow-up echocardiogram.  Symptomatically stable with no evidence of CHF.  Currently on carvedilol and lisinopril.  Low risk nuclear stress test on 10/06/2013.  I would consider repeating this if LV function remains reduced to assess for ischemic heart disease.  2.  Hypertension: Controlled on carvedilol and lisinopril.  No changes.  3.  Hyperlipidemia: No longer on statin.  I will obtain a copy of lipids from PCP.  4.  Right internal carotid artery stenosis: Status post carotid endarterectomy.  Currently on aspirin.  No longer on statin.    5.  Left bundle branch block: I will obtain a follow-up echocardiogram.  See  discussion in #1.  6.  Valvular heart disease: Moderate aortic and mitral regurgitation with most recent echocardiogram reviewed above.  I will obtain a follow-up echocardiogram.   Disposition: Follow up 6 months   Kate Sable, M.D., F.A.C.C.

## 2018-02-16 NOTE — Patient Instructions (Signed)
Medication Instructions:  Continue all current medications.  Labwork: none  Testing/Procedures:  Your physician has requested that you have an echocardiogram. Echocardiography is a painless test that uses sound waves to create images of your heart. It provides your doctor with information about the size and shape of your heart and how well your heart's chambers and valves are working. This procedure takes approximately one hour. There are no restrictions for this procedure.  Office will contact with results via phone or letter.    Follow-Up: Your physician wants you to follow up in: 6 months.  You will receive a reminder letter in the mail one-two months in advance.  If you don't receive a letter, please call our office to schedule the follow up appointment   Any Other Special Instructions Will Be Listed Below (If Applicable).  If you need a refill on your cardiac medications before your next appointment, please call your pharmacy.

## 2018-02-16 NOTE — Telephone Encounter (Signed)
°  Precert needed for: Echo - cardiomyopathy   Location: [facility]   Date: Feb 26, 2018

## 2018-02-17 DIAGNOSIS — H2511 Age-related nuclear cataract, right eye: Secondary | ICD-10-CM | POA: Diagnosis not present

## 2018-02-17 DIAGNOSIS — H25811 Combined forms of age-related cataract, right eye: Secondary | ICD-10-CM | POA: Diagnosis not present

## 2018-02-25 ENCOUNTER — Other Ambulatory Visit: Payer: Self-pay

## 2018-02-26 ENCOUNTER — Other Ambulatory Visit: Payer: Self-pay

## 2018-03-04 ENCOUNTER — Ambulatory Visit (INDEPENDENT_AMBULATORY_CARE_PROVIDER_SITE_OTHER): Payer: PPO

## 2018-03-04 ENCOUNTER — Other Ambulatory Visit: Payer: Self-pay

## 2018-03-04 DIAGNOSIS — I428 Other cardiomyopathies: Secondary | ICD-10-CM | POA: Diagnosis not present

## 2018-03-05 ENCOUNTER — Telehealth: Payer: Self-pay | Admitting: *Deleted

## 2018-03-05 NOTE — Telephone Encounter (Signed)
Notes recorded by Laurine Blazer, LPN on 6/58/0063 at 49:49 AM EST Patient notified. Copy to pmd. ------  Notes recorded by Herminio Commons, MD on 03/05/2018 at 11:34 AM EST Pumping function is now normal. There is some stiffness with heart relaxation. Mild aortic valve leakage. Moderate thickening of mitral valve leaflets with moderate stenosis/narrowing and mild to moderate mitral valve leakage.

## 2018-03-10 DIAGNOSIS — Z6823 Body mass index (BMI) 23.0-23.9, adult: Secondary | ICD-10-CM | POA: Diagnosis not present

## 2018-03-10 DIAGNOSIS — K7460 Unspecified cirrhosis of liver: Secondary | ICD-10-CM | POA: Diagnosis not present

## 2018-03-10 DIAGNOSIS — I10 Essential (primary) hypertension: Secondary | ICD-10-CM | POA: Diagnosis not present

## 2018-03-10 NOTE — Progress Notes (Signed)
HISTORY AND PHYSICAL     CC:  follow up. Requesting Provider:  Neale Burly, MD  HPI: This is a 68 y.o. female here for follow up for carotid artery stenosis.  Pt is s/p right CEA by Dr. Trula Slade on 07/01/17 for symptomatic carotid artery stenosis.  The next day, she returned to the operating room and underwent  Exploration of right CEA site and intraoperative duplex for expressive aphasia and LLE weakness.  The CEA site was widely patent with good diastolic flow throughout and the intraoperative duplex showed no technical problems that could be identified.  A CTA was then performed, which showed that the CEA site was widely patent.    Pt returned to baseline and neurology felt she could be discharged and was discharged home on DAPT.  She was seen back in June for her post operative visit and at that time, she was asymptomatic.  Dr. Trula Slade recommended dual antiplatelet therapy for another 3-6 months.  She returns today for follow up.  She is doing well from her carotid dz.  She states that she was hospitalized last year for lactulose liver and almost lost her life.  She has lost close to 100lbs.  She goes to [employer] daily with her husband and enjoys it.  She denies any amaurosis fugax, speech difficulties, or hemiparesis since surgery.   The pt is not on a statin for cholesterol management.   The pt is not diabetic.   The pt is on an ACEI and BB for hypertension.   Tobacco hx:  Never smoked The pt is on a daily aspirin.  Pt meds includes: Statin:  No. Beta Blocker:  Yes.   Aspirin:  Yes.   ACEI:  Yes.   ARB:  No. CCB use:  No Other Antiplatelet/Anticoagulant:  No   Past Medical History:  Diagnosis Date  . Asthma   . Diabetes mellitus without complication ([place])   . Diarrhea   . Diverticulosis of colon (without mention of hemorrhage)   . Functional ovarian cysts   . GERD (gastroesophageal reflux disease)   . Hemorrhoids, external, without mention of complication   .  Hormone replacement therapy (postmenopausal)   . Hypertension   . IBS (irritable bowel syndrome)   . Migraine   . Right knee DJD   . Seizures ([place])   . Shortness of breath   . Sleep apnea    no cpap    study 2 yrs ago    Past Surgical History:  Procedure Laterality Date  . CHOLECYSTECTOMY  1980  . ENDARTERECTOMY Right 07/01/2017   Procedure: ENDARTERECTOMY CAROTID RIGHT;  Surgeon: Serafina Mitchell, MD;  Location: [place];  Service: Vascular;  Laterality: Right;  . LEFT HEART CATHETERIZATION WITH CORONARY ANGIOGRAM N/A 11/18/2013   Procedure: LEFT HEART CATHETERIZATION WITH CORONARY ANGIOGRAM;  Surgeon: Burnell Blanks, MD;  Location: [facility] CATH LAB;  Service: Cardiovascular;  Laterality: N/A;  . OOPHORECTOMY     2012 Dr. Adah Perl in [place]  . PATCH ANGIOPLASTY Right 07/01/2017   Procedure: RIGHT CAROTID PATCH ANGIOPLASTY;  Surgeon: Serafina Mitchell, MD;  [facility] OR;  Service: Vascular;  Laterality: Right;  . TONSILLECTOMY    . TOTAL KNEE ARTHROPLASTY Right 09/02/2012   Procedure: TOTAL KNEE ARTHROPLASTY- right;  Surgeon: Ninetta Lights, MD;  Location: [place];  Service: Orthopedics;  Laterality: Right;  . TUBAL LIGATION    . TYMPANOMASTOIDECTOMY Left 04/02/2016   Procedure: LEFT CANAL WALL DOWN TYMPANOMASTOIDECTOMY;  Surgeon: Leta Baptist, MD;  Location: [place];  Service: ENT;  Laterality: Left;  [name] VAGINAL HYSTERECTOMY  1990  . WOUND EXPLORATION Right 07/02/2017   Procedure: RIGHT NECK EXPLORATION WITH INTRAOPERATIVE ULTRASOUND;  Surgeon: Angelia Mould, MD;  Location: [facility] OR;  Service: Vascular;  Laterality: Right;    No Known Allergies  Current Outpatient Medications  Medication Sig Dispense Refill  . aspirin EC 81 MG tablet Take 81 mg by mouth daily.     . carvedilol (COREG) 3.125 MG tablet Take 3.125 mg by mouth 2 (two) times daily with a meal.    . cholecalciferol (VITAMIN D) 1000 UNITS tablet Take 1,000 Units by mouth daily.    . furosemide (LASIX) 40  MG tablet Take 40 mg by mouth 2 (two) times daily.     [name] lamoTRIgine (LAMICTAL) 200 MG tablet Take 200 mg by mouth 2 (two) times daily.     [name] lisinopril (PRINIVIL,ZESTRIL) 10 MG tablet Take 10 mg by mouth daily.    [name] omeprazole (PRILOSEC) 20 MG capsule Take 20 mg by mouth daily.    . ondansetron (ZOFRAN) 4 MG tablet Take 4 mg by mouth every 4 (four) hours as needed for nausea or vomiting.    . potassium chloride SA (K-DUR,KLOR-CON) 20 MEQ tablet Take 20 mEq by mouth once.     No current facility-administered medications for this visit.     Family History  Problem Relation Age of Onset  . Heart disease Mother   . Diabetes Mother   . Heart disease Brother   . Cancer Sister   . Heart disease Brother   . Heart disease Brother   . Heart disease Son     Social History   Socioeconomic History  . Marital status: Married    Spouse name: Not on file  . Number of children: Not on file  . Years of education: Not on file  . Highest education level: Not on file  Occupational History  . Not on file  Social Needs  . Financial resource strain: Not on file  . Food insecurity:    Worry: Not on file    Inability: Not on file  . Transportation needs:    Medical: Not on file    Non-medical: Not on file  Tobacco Use  . Smoking status: Never Smoker  . Smokeless tobacco: Never Used  Substance and Sexual Activity  . Alcohol use: No    Alcohol/week: 0.0 standard drinks  . Drug use: No  . Sexual activity: Not on file  Lifestyle  . Physical activity:    Days per week: Not on file    Minutes per session: Not on file  . Stress: Not on file  Relationships  . Social connections:    Talks on phone: Not on file    Gets together: Not on file    Attends religious service: Not on file    Active member of club or organization: Not on file    Attends meetings of clubs or organizations: Not on file    Relationship status: Not on file  . Intimate partner violence:    Fear of current or ex partner:  Not on file    Emotionally abused: Not on file    Physically abused: Not on file    Forced sexual activity: Not on file  Other Topics Concern  . Not on file  Social History Narrative  . Not on file     REVIEW OF SYSTEMS:   [X]  denotes positive finding, [ ]   denotes negative finding Cardiac  Comments:  Chest pain or chest pressure:    Shortness of breath upon exertion:    Short of breath when lying flat:    Irregular heart rhythm: x       Vascular    Pain in calf, thigh, or hip brought on by ambulation:    Pain in feet at night that wakes you up from your sleep:     Blood clot in your veins:    Leg swelling:         Pulmonary    Oxygen at home: x   Productive cough:     Wheezing:         Neurologic    Sudden weakness in arms or legs:     Sudden numbness in arms or legs:     Sudden onset of difficulty speaking or slurred speech:    Temporary loss of vision in one eye:     Problems with dizziness:         Gastrointestinal    Blood in stool:     Vomited blood:         Genitourinary    Burning when urinating:     Blood in urine:        Psychiatric    Major depression:         Hematologic    Bleeding problems:    Problems with blood clotting too easily:        Skin    Rashes or ulcers:        Constitutional    Fever or chills:      PHYSICAL EXAMINATION:  Today's Vitals   03/13/18 0827 03/13/18 0831  BP: 108/63 117/65  Pulse: 64 63  Resp: 16   Temp: (!) 97.1 F (36.2 C)   TempSrc: Oral   SpO2: 99%   Weight: 141 lb (64 kg)   Height: 5' 4"  (1.626 m)   PainSc: 10-Worst pain ever    Body mass index is 24.2 kg/m.   General:  WDWN in NAD; vital signs documented above Gait: Not observed HENT: WNL, normocephalic Pulmonary: normal non-labored breathing , without Rales, rhonchi,  wheezing Cardiac: regular HR, without  Murmurs, rubs or gallops; without carotid bruits Abdomen: soft, NT, no masses Skin: without rashes; well healed right CEA  incision Vascular Exam/Pulses:  Right Left  Radial 2+ (normal) 2+ (normal)  DP Unable to palpate Unable to palpate   PT 2+ (normal) Unable to palpate    Extremities: without ischemic changes, without Gangrene , without cellulitis; without open wounds;  Musculoskeletal: no muscle wasting or atrophy  Neurologic: A&O X 3 Psychiatric:  The pt has Normal affect.   Non-Invasive Vascular Imaging:   Carotid Duplex on 03/13/2018: Right:  1-39% stenosis Left:  1-39% stenosis Vertebrals:  Bilateral vertebral arteries demonstrate antegrade flow. Subclavians: Normal flow hemodynamics were seen in bilateral subclavian arteries.  Previous Carotid duplex on 06/30/17 (prior to CEA): Right: 80-99% stenosis Left:   1-39% stenosis V ertebrals:  Bilateral vertebral arteries demonstrate antegrade flow. Subclavians: Normal flow hemodynamics were seen in bilateral subclavian arteries.   ASSESSMENT/PLAN:: 68 y.o. female here for follow up carotid artery stenosis and is s/p right CEA by Dr. Trula Slade on 07/01/17 for symptomatic carotid artery stenosis and re-exploration on 07/02/17 by Dr. Scot Dock presents today for follow up.    -pt is doing very well since her right CEA and remains asymptomatic.  She is exercising daily at the gym.  Discussed healthy eating  habits.   -she will f/u in 6 months with carotid duplex.  If it looks good, will have her f/u in one year.  She knows to go to the ER should she have any cva/tia sx, which were discussed with her and her husband.    Leontine Locket, PA-C [facility] [phone]  Clinic MD:  Donzetta Matters

## 2018-03-13 ENCOUNTER — Ambulatory Visit (INDEPENDENT_AMBULATORY_CARE_PROVIDER_SITE_OTHER): Payer: PPO | Admitting: Physician Assistant

## 2018-03-13 ENCOUNTER — Other Ambulatory Visit: Payer: Self-pay | Admitting: Pharmacist

## 2018-03-13 ENCOUNTER — Encounter: Payer: Self-pay | Admitting: Family

## 2018-03-13 ENCOUNTER — Ambulatory Visit (HOSPITAL_COMMUNITY)
Admission: RE | Admit: 2018-03-13 | Discharge: 2018-03-13 | Disposition: A | Discharge status: Home / Self Care | Payer: PPO | Source: Ambulatory Visit | Attending: Surgery | Admitting: Surgery

## 2018-03-13 ENCOUNTER — Other Ambulatory Visit: Payer: Self-pay

## 2018-03-13 VITALS — BP 117/65 | HR 63 | Temp 97.1°F | Resp 16 | Ht 64.0 in | Wt 141.0 lb

## 2018-03-13 DIAGNOSIS — I6521 Occlusion and stenosis of right carotid artery: Secondary | ICD-10-CM

## 2018-03-13 NOTE — Patient Outreach (Signed)
[facility]) Care Management  03/13/2018  BREYAH AKHTER 10/15/1950 [id number]  Referral Reason: Medication Management  Referral Source: [employer]  Patient failed statin, ACEi/ARB, and diabetes adherence measures in 2019. Contacted patient to determine if any pharmacy intervention would benefit medication adherence for 2020. Left HIPPA compliant message for patient to call me back at her convenience.   Catie Darnelle Maffucci, PharmD PGY2 Ambulatory Care Pharmacy Resident, [place] [facility] Phone: [phone]

## 2018-03-18 DIAGNOSIS — Z96659 Presence of unspecified artificial knee joint: Secondary | ICD-10-CM | POA: Diagnosis not present

## 2018-03-18 DIAGNOSIS — J449 Chronic obstructive pulmonary disease, unspecified: Secondary | ICD-10-CM | POA: Diagnosis not present

## 2018-03-18 DIAGNOSIS — I5020 Unspecified systolic (congestive) heart failure: Secondary | ICD-10-CM | POA: Diagnosis not present

## 2018-03-20 ENCOUNTER — Other Ambulatory Visit: Payer: Self-pay | Admitting: Pharmacist

## 2018-03-20 NOTE — Patient Outreach (Signed)
[facility]) Care Management  03/20/2018  Carol Richmond [birth year] [id number]   Referral Reason: Medication Management  Referral Source: [employer]  Patient failed statin, ACEi/ARB, and diabetes adherence measures in 2019. Contacted patient to determine if any pharmacy intervention would benefit medication adherence for 2020  Contacted patient; left HIPAA compliant message for patient to call me back. Will f/u in 3-4 business days if I have not heard back.   Catie Darnelle Maffucci, PharmD PGY2 Ambulatory Care Pharmacy Resident, [place] [facility] Phone: [phone]

## 2018-03-27 ENCOUNTER — Other Ambulatory Visit: Payer: Self-pay | Admitting: Pharmacist

## 2018-03-27 NOTE — Addendum Note (Signed)
Addended by: De Hollingshead on: 03/27/2018 01:09 PM   Modules accepted: Orders

## 2018-03-27 NOTE — Patient Outreach (Addendum)
[place] [facility]) Care Management  [place]   03/27/2018  Carol Richmond 02-Aug-1950 [id number]   Referral Reason: Medication Management  Referral Source: [employer]  Patient failed statin, ACEi/ARB, and diabetes adherence measures in 2019. Contacted patient to determine if any pharmacy intervention would benefit medication adherence for 2020. HIPAA verifiers identified. She notes that she "used to be on a bag of medications", but many things were discontinued in the last few months.   She confirms that she is no longer taking simvastatin, lisinopril, or metformin, and that her doctor stopped all of these medications. Per call to pharmacy, she has not filled these medications in 2020.   Per chart review, patient had significantly poor health last year d/t severe carotid stenosis that resulted in stroke-like symptoms. She has recovered well sp enderarterectomy, and is no longer on statin therapy or DAPT.   Objective: Lab Results  Component Value Date   CREATININE 0.87 08/15/2017   CREATININE 0.93 07/02/2017   CREATININE 0.95 07/01/2017    Lab Results  Component Value Date   HGBA1C 6.2 (H) 09/02/2012  Reports fasting SMBG 100-110 most days   Lipid Panel     Component Value Date/Time   CHOL  05/08/2007 0530    196        ATP III CLASSIFICATION:  <200     mg/dL   Desirable  200-239  mg/dL   Borderline High  >=240    mg/dL   High   TRIG 192 (H) 05/08/2007 0530   HDL 38 (L) 05/08/2007 0530   CHOLHDL 5.2 05/08/2007 0530   VLDL 38 05/08/2007 0530   LDLCALC (H) 05/08/2007 0530    120        Total Cholesterol/HDL:CHD Risk Coronary Heart Disease Risk Table                     Men   Women  1/2 Average Risk   3.4   3.3    BP Readings from Last 3 Encounters:  03/13/18 117/65  02/16/18 107/67  08/16/17 120/73    Allergies  Allergen Reactions  . Iodinated Diagnostic Agents Hives    Medications Reviewed Today    Reviewed by Gabriel Earing, PA-C (Physician Assistant) on 03/13/18 at [phone]  Med List Status: <None>  Medication Order Taking? Sig Documenting Provider Last Dose Status Informant  aspirin EC 81 MG tablet [id number] Yes Take 81 mg by mouth daily.  [provider] Taking Active Spouse/Significant Other  carvedilol (COREG) 3.125 MG tablet [id number] Yes Take 3.125 mg by mouth 2 (two) times daily with a meal. [provider] Taking Active Spouse/Significant Other  cholecalciferol (VITAMIN D) 1000 UNITS tablet [id number] Yes Take 1,000 Units by mouth daily. [provider] Taking Active Spouse/Significant Other  furosemide (LASIX) 40 MG tablet [id number] Yes Take 40 mg by mouth 2 (two) times daily.  [provider] Taking Active Spouse/Significant Other  lactulose (CHRONULAC) 10 GM/15ML solution [id number] Yes Take 30 g by mouth 4 (four) times daily. [provider] Taking Active Self  lamoTRIgine (LAMICTAL) 200 MG tablet [id number] Yes Take 200 mg by mouth 2 (two) times daily.  [provider] Taking Active Spouse/Significant Other  lisinopril (PRINIVIL,ZESTRIL) 10 MG tablet [id number] No Take 10 mg by mouth daily. [provider] Not Taking Active Spouse/Significant Other  omeprazole (PRILOSEC) 20 MG capsule [id number] No Take 20 mg by mouth daily. [provider] Not Taking Active Spouse/Significant Other  ondansetron (ZOFRAN)  4 MG tablet [id number] Yes Take 4 mg by mouth every 4 (four) hours as needed for nausea or vomiting. [provider] Taking Active Spouse/Significant Other  potassium chloride SA (K-DUR,KLOR-CON) 20 MEQ tablet [id number] Yes Take 20 mEq by mouth once. [provider] Taking Active   Med List Note Lisette Abu 08/15/17 2322): Medications were provided for confirmation         Assessment:  Drugs sorted by system:  Neurologic/Psychologic: - Lamotrigine  Cardiovascular: - Aspirin - Carvedilol - Furosemide    Gastrointestinal: - Lactulose - Pantoprazole - ondansetron PRN  Vitamins/Minerals/Supplements: - Potassium - Vitamin D   Plan: - Patient denies any questions or concerns at this time. She reports being able to appropriately manage her medications as directed. Multiple significant medical conditions last year likely contributed to potential issues with medication adherence, though this appears to be resolved, and patient is no longer on a statin, ACEi/ARB, or diabetes agents. Will close case.  - Will route to PCP Dr. Telford Nab for [facility], PharmD, [place] PGY2 Ambulatory Care Pharmacy Resident, [place] Phone: [phone]

## 2018-04-08 DIAGNOSIS — J209 Acute bronchitis, unspecified: Secondary | ICD-10-CM | POA: Diagnosis not present

## 2018-04-14 DIAGNOSIS — R05 Cough: Secondary | ICD-10-CM | POA: Diagnosis not present

## 2018-04-14 DIAGNOSIS — H6691 Otitis media, unspecified, right ear: Secondary | ICD-10-CM | POA: Diagnosis not present

## 2018-04-14 DIAGNOSIS — B9689 Other specified bacterial agents as the cause of diseases classified elsewhere: Secondary | ICD-10-CM | POA: Diagnosis not present

## 2018-04-14 DIAGNOSIS — J208 Acute bronchitis due to other specified organisms: Secondary | ICD-10-CM | POA: Diagnosis not present

## 2018-04-17 DIAGNOSIS — B370 Candidal stomatitis: Secondary | ICD-10-CM | POA: Diagnosis not present

## 2018-04-18 DIAGNOSIS — I5020 Unspecified systolic (congestive) heart failure: Secondary | ICD-10-CM | POA: Diagnosis not present

## 2018-04-18 DIAGNOSIS — Z96659 Presence of unspecified artificial knee joint: Secondary | ICD-10-CM | POA: Diagnosis not present

## 2018-04-18 DIAGNOSIS — J449 Chronic obstructive pulmonary disease, unspecified: Secondary | ICD-10-CM | POA: Diagnosis not present

## 2018-04-29 DIAGNOSIS — I10 Essential (primary) hypertension: Secondary | ICD-10-CM | POA: Diagnosis not present

## 2018-04-29 DIAGNOSIS — Z6822 Body mass index (BMI) 22.0-22.9, adult: Secondary | ICD-10-CM | POA: Diagnosis not present

## 2018-04-29 DIAGNOSIS — J069 Acute upper respiratory infection, unspecified: Secondary | ICD-10-CM | POA: Diagnosis not present

## 2018-05-04 DIAGNOSIS — Z6822 Body mass index (BMI) 22.0-22.9, adult: Secondary | ICD-10-CM | POA: Diagnosis not present

## 2018-05-04 DIAGNOSIS — R109 Unspecified abdominal pain: Secondary | ICD-10-CM | POA: Diagnosis not present

## 2018-05-06 DIAGNOSIS — J449 Chronic obstructive pulmonary disease, unspecified: Secondary | ICD-10-CM | POA: Diagnosis not present

## 2018-05-06 DIAGNOSIS — R1084 Generalized abdominal pain: Secondary | ICD-10-CM | POA: Diagnosis not present

## 2018-05-06 DIAGNOSIS — Z96659 Presence of unspecified artificial knee joint: Secondary | ICD-10-CM | POA: Diagnosis not present

## 2018-05-06 DIAGNOSIS — K7460 Unspecified cirrhosis of liver: Secondary | ICD-10-CM | POA: Diagnosis not present

## 2018-05-06 DIAGNOSIS — I700 Atherosclerosis of aorta: Secondary | ICD-10-CM | POA: Diagnosis not present

## 2018-05-06 DIAGNOSIS — I5020 Unspecified systolic (congestive) heart failure: Secondary | ICD-10-CM | POA: Diagnosis not present

## 2018-05-12 DIAGNOSIS — E785 Hyperlipidemia, unspecified: Secondary | ICD-10-CM | POA: Diagnosis not present

## 2018-05-12 DIAGNOSIS — I130 Hypertensive heart and chronic kidney disease with heart failure and stage 1 through stage 4 chronic kidney disease, or unspecified chronic kidney disease: Secondary | ICD-10-CM | POA: Diagnosis not present

## 2018-05-12 DIAGNOSIS — K219 Gastro-esophageal reflux disease without esophagitis: Secondary | ICD-10-CM | POA: Diagnosis not present

## 2018-05-12 DIAGNOSIS — Z79899 Other long term (current) drug therapy: Secondary | ICD-10-CM | POA: Diagnosis not present

## 2018-05-12 DIAGNOSIS — F329 Major depressive disorder, single episode, unspecified: Secondary | ICD-10-CM | POA: Diagnosis not present

## 2018-05-12 DIAGNOSIS — I5022 Chronic systolic (congestive) heart failure: Secondary | ICD-10-CM | POA: Diagnosis not present

## 2018-05-12 DIAGNOSIS — N183 Chronic kidney disease, stage 3 (moderate): Secondary | ICD-10-CM | POA: Diagnosis not present

## 2018-05-12 DIAGNOSIS — E1122 Type 2 diabetes mellitus with diabetic chronic kidney disease: Secondary | ICD-10-CM | POA: Diagnosis not present

## 2018-05-12 DIAGNOSIS — Z7902 Long term (current) use of antithrombotics/antiplatelets: Secondary | ICD-10-CM | POA: Diagnosis not present

## 2018-05-12 DIAGNOSIS — K7460 Unspecified cirrhosis of liver: Secondary | ICD-10-CM | POA: Diagnosis not present

## 2018-05-12 DIAGNOSIS — Z7982 Long term (current) use of aspirin: Secondary | ICD-10-CM | POA: Diagnosis not present

## 2018-05-12 DIAGNOSIS — K5732 Diverticulitis of large intestine without perforation or abscess without bleeding: Secondary | ICD-10-CM | POA: Diagnosis not present

## 2018-05-21 DIAGNOSIS — K5753 Diverticulitis of both small and large intestine without perforation or abscess with bleeding: Secondary | ICD-10-CM | POA: Diagnosis not present

## 2018-05-21 DIAGNOSIS — C44329 Squamous cell carcinoma of skin of other parts of face: Secondary | ICD-10-CM | POA: Diagnosis not present

## 2018-05-21 DIAGNOSIS — Z4659 Encounter for fitting and adjustment of other gastrointestinal appliance and device: Secondary | ICD-10-CM | POA: Diagnosis not present

## 2018-05-21 DIAGNOSIS — H2513 Age-related nuclear cataract, bilateral: Secondary | ICD-10-CM | POA: Diagnosis not present

## 2018-05-21 DIAGNOSIS — Z934 Other artificial openings of gastrointestinal tract status: Secondary | ICD-10-CM | POA: Diagnosis not present

## 2018-05-21 DIAGNOSIS — S14109D Unspecified injury at unspecified level of cervical spinal cord, subsequent encounter: Secondary | ICD-10-CM | POA: Diagnosis not present

## 2018-05-21 DIAGNOSIS — Z6822 Body mass index (BMI) 22.0-22.9, adult: Secondary | ICD-10-CM | POA: Diagnosis not present

## 2018-05-21 DIAGNOSIS — H527 Unspecified disorder of refraction: Secondary | ICD-10-CM | POA: Diagnosis not present

## 2018-05-21 DIAGNOSIS — Z0000 Encounter for general adult medical examination without abnormal findings: Secondary | ICD-10-CM | POA: Diagnosis not present

## 2018-05-21 DIAGNOSIS — I82411 Acute embolism and thrombosis of right femoral vein: Secondary | ICD-10-CM | POA: Diagnosis not present

## 2018-06-17 DIAGNOSIS — I5020 Unspecified systolic (congestive) heart failure: Secondary | ICD-10-CM | POA: Diagnosis not present

## 2018-06-17 DIAGNOSIS — J449 Chronic obstructive pulmonary disease, unspecified: Secondary | ICD-10-CM | POA: Diagnosis not present

## 2018-06-17 DIAGNOSIS — Z96659 Presence of unspecified artificial knee joint: Secondary | ICD-10-CM | POA: Diagnosis not present

## 2018-06-18 DIAGNOSIS — M542 Cervicalgia: Secondary | ICD-10-CM | POA: Diagnosis not present

## 2018-06-18 DIAGNOSIS — Z6822 Body mass index (BMI) 22.0-22.9, adult: Secondary | ICD-10-CM | POA: Diagnosis not present

## 2018-06-18 DIAGNOSIS — M79629 Pain in unspecified upper arm: Secondary | ICD-10-CM | POA: Diagnosis not present

## 2018-06-18 DIAGNOSIS — I10 Essential (primary) hypertension: Secondary | ICD-10-CM | POA: Diagnosis not present

## 2018-06-24 DIAGNOSIS — K140 Glossitis: Secondary | ICD-10-CM | POA: Diagnosis not present

## 2018-07-01 DIAGNOSIS — H3582 Retinal ischemia: Secondary | ICD-10-CM | POA: Diagnosis not present

## 2018-07-01 DIAGNOSIS — E119 Type 2 diabetes mellitus without complications: Secondary | ICD-10-CM | POA: Diagnosis not present

## 2018-07-01 DIAGNOSIS — H35041 Retinal micro-aneurysms, unspecified, right eye: Secondary | ICD-10-CM | POA: Diagnosis not present

## 2018-07-01 DIAGNOSIS — H348112 Central retinal vein occlusion, right eye, stable: Secondary | ICD-10-CM | POA: Diagnosis not present

## 2018-07-09 DIAGNOSIS — K140 Glossitis: Secondary | ICD-10-CM | POA: Diagnosis not present

## 2018-07-09 DIAGNOSIS — K146 Glossodynia: Secondary | ICD-10-CM | POA: Diagnosis not present

## 2018-07-10 DIAGNOSIS — K140 Glossitis: Secondary | ICD-10-CM | POA: Diagnosis not present

## 2018-07-10 DIAGNOSIS — K146 Glossodynia: Secondary | ICD-10-CM | POA: Diagnosis not present

## 2018-07-14 DIAGNOSIS — K140 Glossitis: Secondary | ICD-10-CM | POA: Diagnosis not present

## 2018-07-14 DIAGNOSIS — Z6822 Body mass index (BMI) 22.0-22.9, adult: Secondary | ICD-10-CM | POA: Diagnosis not present

## 2018-07-14 DIAGNOSIS — D649 Anemia, unspecified: Secondary | ICD-10-CM | POA: Diagnosis not present

## 2018-07-17 DIAGNOSIS — I5020 Unspecified systolic (congestive) heart failure: Secondary | ICD-10-CM | POA: Diagnosis not present

## 2018-07-17 DIAGNOSIS — J449 Chronic obstructive pulmonary disease, unspecified: Secondary | ICD-10-CM | POA: Diagnosis not present

## 2018-07-17 DIAGNOSIS — Z96659 Presence of unspecified artificial knee joint: Secondary | ICD-10-CM | POA: Diagnosis not present

## 2018-07-24 DIAGNOSIS — D649 Anemia, unspecified: Secondary | ICD-10-CM | POA: Diagnosis not present

## 2018-07-28 DIAGNOSIS — D649 Anemia, unspecified: Secondary | ICD-10-CM | POA: Diagnosis not present

## 2018-07-28 DIAGNOSIS — R531 Weakness: Secondary | ICD-10-CM | POA: Diagnosis not present

## 2018-08-04 ENCOUNTER — Ambulatory Visit (INDEPENDENT_AMBULATORY_CARE_PROVIDER_SITE_OTHER): Payer: PPO | Admitting: Internal Medicine

## 2018-08-05 ENCOUNTER — Ambulatory Visit (INDEPENDENT_AMBULATORY_CARE_PROVIDER_SITE_OTHER): Payer: PPO | Admitting: Internal Medicine

## 2018-08-05 ENCOUNTER — Other Ambulatory Visit: Payer: Self-pay

## 2018-08-05 ENCOUNTER — Encounter (INDEPENDENT_AMBULATORY_CARE_PROVIDER_SITE_OTHER): Payer: Self-pay | Admitting: Internal Medicine

## 2018-08-05 VITALS — BP 130/73 | HR 63 | Temp 97.4°F | Ht 64.0 in | Wt 137.7 lb

## 2018-08-05 DIAGNOSIS — D6489 Other specified anemias: Secondary | ICD-10-CM

## 2018-08-05 NOTE — Progress Notes (Addendum)
Subjective:    [name], female    DOB: 05/07/1950, 68 y.o.   MRN: [id number]  HPI Referred by Dr. Sherrie Sport for low hemoglobin/upper endoscopy. On 07/24/2018 noted to have a hemoglobin of 7.0 She received 2 units of PRBCs. She had a colonoscopy by Dr .Anthony Sar but she cannot remember when it was. Denies Tylenol. Does not take NSAIDs.  Her BMs are brown. No melena. Denies bright red rectal bleeding. No change in her stools.  Gerd controlled with Protonix.    Colonoscopy 06/02/2017  (personal hx of colon polyps). Normal colonoscopy. Normal appearing cecum  514/2019 Underwent a rt carotid endarterectomy with bovine pericardial patch angioplasty by Dr. Trula Slade.   Review of Systems Past Medical History:  Diagnosis Date  . Asthma   . Diabetes mellitus without complication ([place])   . Diarrhea   . Diverticulosis of colon (without mention of hemorrhage)   . Functional ovarian cysts   . GERD (gastroesophageal reflux disease)   . Hemorrhoids, external, without mention of complication   . Hormone replacement therapy (postmenopausal)   . Hypertension   . IBS (irritable bowel syndrome)   . Migraine   . Right knee DJD   . Seizures ([place])   . Shortness of breath   . Sleep apnea    no cpap    study 2 yrs ago    Past Surgical History:  Procedure Laterality Date  . CHOLECYSTECTOMY  1980  . ENDARTERECTOMY Right 07/01/2017   Procedure: ENDARTERECTOMY CAROTID RIGHT;  Surgeon: Serafina Mitchell, MD;  Location: [place];  Service: Vascular;  Laterality: Right;  . LEFT HEART CATHETERIZATION WITH CORONARY ANGIOGRAM N/A 11/18/2013   Procedure: LEFT HEART CATHETERIZATION WITH CORONARY ANGIOGRAM;  Surgeon: Burnell Blanks, MD;  Location: [facility] CATH LAB;  Service: Cardiovascular;  Laterality: N/A;  . OOPHORECTOMY     2012 Dr. Adah Perl in [place]  . PATCH ANGIOPLASTY Right 07/01/2017   Procedure: RIGHT CAROTID PATCH ANGIOPLASTY;  Surgeon: Serafina Mitchell, MD;  Location: [facility] OR;   Service: Vascular;  Laterality: Right;  . TONSILLECTOMY    . TOTAL KNEE ARTHROPLASTY Right 09/02/2012   Procedure: TOTAL KNEE ARTHROPLASTY- right;  Surgeon: Ninetta Lights, MD;  Location: [place];  Service: Orthopedics;  Laterality: Right;  . TUBAL LIGATION    . TYMPANOMASTOIDECTOMY Left 04/02/2016   Procedure: LEFT CANAL WALL DOWN TYMPANOMASTOIDECTOMY;  Surgeon: Leta Baptist, MD;  Location: [place];  Service: ENT;  Laterality: Left;  [name] VAGINAL HYSTERECTOMY  1990  . WOUND EXPLORATION Right 07/02/2017   Procedure: RIGHT NECK EXPLORATION WITH INTRAOPERATIVE ULTRASOUND;  Surgeon: Angelia Mould, MD;  Location: [place];  Service: Vascular;  Laterality: Right;    Allergies  Allergen Reactions  . Iodinated Diagnostic Agents Hives    Current Outpatient Medications on File Prior to Visit  Medication Sig Dispense Refill  . aspirin EC 81 MG tablet Take 81 mg by mouth daily.     . carvedilol (COREG) 3.125 MG tablet Take 3.125 mg by mouth 2 (two) times daily with a meal.    . cholecalciferol (VITAMIN D) 1000 UNITS tablet Take 1,000 Units by mouth daily.    . furosemide (LASIX) 40 MG tablet Take 40 mg by mouth 2 (two) times daily.     [name] lactulose (CHRONULAC) 10 GM/15ML solution Take 30 g by mouth 4 (four) times daily.    [name] lamoTRIgine (LAMICTAL) 200 MG tablet Take 200 mg by mouth 2 (two) times daily.     [name]  Multiple Vitamin (MULTIVITAMIN) tablet Take 1 tablet by mouth daily.    . pantoprazole (PROTONIX) 40 MG tablet Take 40 mg by mouth once.    . potassium chloride SA (K-DUR,KLOR-CON) 20 MEQ tablet Take 20 mEq by mouth once.     No current facility-administered medications on file prior to visit.         Objective:   Physical Exam Blood pressure 130/73, pulse 63, temperature (!) 97.4 F (36.3 C), height 5' 4"  (1.626 m), weight 137 lb 11.2 oz (62.5 kg). Alert and oriented. Skin warm and dry. Oral mucosa is moist.   . Sclera anicteric, conjunctivae is pink. Thyroid not enlarged.  No cervical lymphadenopathy. Lungs clear. Heart regular rate and rhythm.  Abdomen is soft. Bowel sounds are positive. No hepatomegaly. No abdominal masses felt. No tenderness.  No edema to lower extremities. Rectal: no masses stool is hemoccult negative          Assessment & Plan:  Anemia. Am going to get iron studies. 3 stool cards home with patient. Further recommendations to follow.  Dr. Sherrie Sport wants patient to have EGD

## 2018-08-05 NOTE — Patient Instructions (Signed)
Labs today

## 2018-08-06 LAB — IRON, TOTAL/TOTAL IRON BINDING CAP
%SAT: 24 % (calc) (ref 16–45)
Iron: 119 ug/dL (ref 45–160)

## 2018-08-06 LAB — FERRITIN: Ferritin: 15 ng/mL — ABNORMAL LOW (ref 16–288)

## 2018-08-06 LAB — CBC WITH DIFFERENTIAL/PLATELET
Absolute Monocytes: 983 cells/uL — ABNORMAL HIGH (ref 200–950)
Basophils Absolute: 20 cells/uL (ref 0–200)
Basophils Relative: 0.3 %
Eosinophils Absolute: 73 cells/uL (ref 15–500)
Eosinophils Relative: 1.1 %
HCT: 35.8 % (ref 35.0–45.0)
Hemoglobin: 11 g/dL — ABNORMAL LOW (ref 11.7–15.5)
Lymphs Abs: 2033 cells/uL (ref 850–3900)
MCH: 22.8 pg — ABNORMAL LOW (ref 27.0–33.0)
MCHC: 30.7 g/dL — ABNORMAL LOW (ref 32.0–36.0)
MCV: 74.3 fL — ABNORMAL LOW (ref 80.0–100.0)
MPV: 10.3 fL (ref 7.5–12.5)
Monocytes Relative: 14.9 %
Neutro Abs: 3491 cells/uL (ref 1500–7800)
Neutrophils Relative %: 52.9 %
Platelets: 205 10*3/uL (ref 140–400)
RBC: 4.82 10*6/uL (ref 3.80–5.10)
RDW: 22.6 % — ABNORMAL HIGH (ref 11.0–15.0)
Total Lymphocyte: 30.8 %
WBC: 6.6 10*3/uL (ref 3.8–10.8)

## 2018-08-06 LAB — VITAMIN B12: Vitamin B-12: 365 pg/mL (ref 200–1100)

## 2018-08-06 LAB — IRON,?TOTAL/TOTAL IRON BINDING CAP: TIBC: 492 mcg/dL (calc) — ABNORMAL HIGH (ref 250–450)

## 2018-08-10 ENCOUNTER — Other Ambulatory Visit (INDEPENDENT_AMBULATORY_CARE_PROVIDER_SITE_OTHER): Payer: Self-pay | Admitting: Internal Medicine

## 2018-08-10 ENCOUNTER — Telehealth (INDEPENDENT_AMBULATORY_CARE_PROVIDER_SITE_OTHER): Payer: Self-pay | Admitting: Internal Medicine

## 2018-08-10 DIAGNOSIS — D508 Other iron deficiency anemias: Secondary | ICD-10-CM

## 2018-08-10 NOTE — Telephone Encounter (Signed)
Carol Richmond, EGD. Diagnosis IDA

## 2018-08-12 NOTE — Telephone Encounter (Signed)
Left message for patient to call me

## 2018-08-13 ENCOUNTER — Telehealth (INDEPENDENT_AMBULATORY_CARE_PROVIDER_SITE_OTHER): Payer: Self-pay | Admitting: Internal Medicine

## 2018-08-13 NOTE — Telephone Encounter (Signed)
Three stool card returned. One one positive .Rest were negative.

## 2018-08-17 ENCOUNTER — Encounter (INDEPENDENT_AMBULATORY_CARE_PROVIDER_SITE_OTHER): Payer: Self-pay | Admitting: *Deleted

## 2018-08-17 DIAGNOSIS — J449 Chronic obstructive pulmonary disease, unspecified: Secondary | ICD-10-CM | POA: Diagnosis not present

## 2018-08-17 DIAGNOSIS — D649 Anemia, unspecified: Secondary | ICD-10-CM | POA: Insufficient documentation

## 2018-08-17 DIAGNOSIS — I5020 Unspecified systolic (congestive) heart failure: Secondary | ICD-10-CM | POA: Diagnosis not present

## 2018-08-17 DIAGNOSIS — Z96659 Presence of unspecified artificial knee joint: Secondary | ICD-10-CM | POA: Diagnosis not present

## 2018-08-17 NOTE — Telephone Encounter (Signed)
EGD sch'd 09/23/18, instructions mailed to patient

## 2018-08-31 DIAGNOSIS — K140 Glossitis: Secondary | ICD-10-CM | POA: Diagnosis not present

## 2018-08-31 DIAGNOSIS — D649 Anemia, unspecified: Secondary | ICD-10-CM | POA: Diagnosis not present

## 2018-08-31 DIAGNOSIS — Z6823 Body mass index (BMI) 23.0-23.9, adult: Secondary | ICD-10-CM | POA: Diagnosis not present

## 2018-08-31 DIAGNOSIS — M542 Cervicalgia: Secondary | ICD-10-CM | POA: Diagnosis not present

## 2018-09-04 ENCOUNTER — Other Ambulatory Visit: Payer: Self-pay

## 2018-09-04 DIAGNOSIS — I6521 Occlusion and stenosis of right carotid artery: Secondary | ICD-10-CM

## 2018-09-04 DIAGNOSIS — I70211 Atherosclerosis of native arteries of extremities with intermittent claudication, right leg: Secondary | ICD-10-CM

## 2018-09-10 ENCOUNTER — Telehealth (HOSPITAL_COMMUNITY): Payer: Self-pay

## 2018-09-10 DIAGNOSIS — Z0000 Encounter for general adult medical examination without abnormal findings: Secondary | ICD-10-CM | POA: Diagnosis not present

## 2018-09-10 DIAGNOSIS — E119 Type 2 diabetes mellitus without complications: Secondary | ICD-10-CM | POA: Diagnosis not present

## 2018-09-10 DIAGNOSIS — M542 Cervicalgia: Secondary | ICD-10-CM | POA: Diagnosis not present

## 2018-09-10 DIAGNOSIS — D649 Anemia, unspecified: Secondary | ICD-10-CM | POA: Diagnosis not present

## 2018-09-10 DIAGNOSIS — Z6822 Body mass index (BMI) 22.0-22.9, adult: Secondary | ICD-10-CM | POA: Diagnosis not present

## 2018-09-10 DIAGNOSIS — Z1389 Encounter for screening for other disorder: Secondary | ICD-10-CM | POA: Diagnosis not present

## 2018-09-10 DIAGNOSIS — K210 Gastro-esophageal reflux disease with esophagitis: Secondary | ICD-10-CM | POA: Diagnosis not present

## 2018-09-10 NOTE — Telephone Encounter (Signed)
The above patient or their representative was contacted and gave the following answers to these questions:         Do you have any of the following symptoms? No  Fever                    Cough                   Shortness of breath  Do  you have any of the following other symptoms? No   muscle pain         vomiting,        diarrhea        rash         weakness        red eye        abdominal pain         bruising          bruising or bleeding              joint pain           severe headache    Have you been in contact with someone who was or has been sick in the past 2 weeks? No  Yes                 Unsure                         Unable to assess   Does the person that you were in contact with have any of the following symptoms?   Cough         shortness of breath           muscle pain         vomiting,            diarrhea            rash            weakness           fever            red eye           abdominal pain           bruising  or  bleeding                joint pain                severe headache               Have you  or someone you have been in contact with traveled internationally in th last month? No        If yes, which countries?   Have you  or someone you have been in contact with traveled outside [place] in th last month? No         If yes, which state and city?   COMMENTS OR ACTION PLAN FOR THIS PATIENT:          

## 2018-09-11 ENCOUNTER — Ambulatory Visit (INDEPENDENT_AMBULATORY_CARE_PROVIDER_SITE_OTHER): Payer: PPO | Admitting: Family

## 2018-09-11 ENCOUNTER — Other Ambulatory Visit: Payer: Self-pay

## 2018-09-11 ENCOUNTER — Encounter: Payer: Self-pay | Admitting: Family

## 2018-09-11 ENCOUNTER — Ambulatory Visit (HOSPITAL_COMMUNITY)
Admission: RE | Admit: 2018-09-11 | Discharge: 2018-09-11 | Disposition: A | Discharge status: Home / Self Care | Payer: PPO | Source: Ambulatory Visit | Attending: Family | Admitting: Family

## 2018-09-11 VITALS — BP 116/72 | HR 56 | Temp 98.6°F | Resp 16 | Ht 65.0 in | Wt 138.0 lb

## 2018-09-11 DIAGNOSIS — I6521 Occlusion and stenosis of right carotid artery: Secondary | ICD-10-CM | POA: Diagnosis not present

## 2018-09-11 DIAGNOSIS — I6523 Occlusion and stenosis of bilateral carotid arteries: Secondary | ICD-10-CM | POA: Diagnosis not present

## 2018-09-11 DIAGNOSIS — I70211 Atherosclerosis of native arteries of extremities with intermittent claudication, right leg: Secondary | ICD-10-CM | POA: Insufficient documentation

## 2018-09-11 DIAGNOSIS — Z9889 Other specified postprocedural states: Secondary | ICD-10-CM

## 2018-09-11 NOTE — Patient Instructions (Signed)

## 2018-09-11 NOTE — Progress Notes (Signed)
Chief Complaint: Follow up Extracranial Carotid Artery Stenosis   History of Present Illness  Carol Richmond is a 68 y.o. female who is s/p right CEA by Dr. Trula Slade on 07/01/17 for symptomatic carotid artery stenosis.  The next day, she returned to the operating room and underwent exploration of right CEA site and intraoperative duplex for expressive aphasia and LLE weakness.  The CEA site was widely patent with good diastolic flow throughout and the intraoperative duplex showed no technical problems that could be identified.  A CTA was then performed, which showed that the CEA site was widely patent.    Pt returned to baseline and neurology felt she could be discharged and was discharged home on DAPT.  She was seen back in June for her post operative visit and at that time, she was asymptomatic.  Dr. Trula Slade recommended dual antiplatelet therapy for another 3-6 months.  She returns today for follow up.   She is doing well from her carotid disease.  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  She has lost close to 100lbs.  She was going to [employer] daily with her husband and enjoyed it until the gym closed due to COVID 19 restrictions.  She now walks every morning if it is not too hot.  She denies any amaurosis fugax, speech difficulties, or hemiparesis since surgery.   She denies dyspnea or chest pain, denies fever or chills.    Diabetic: no Tobacco use: non-smoker  Pt meds include: Statin : yes  ASA: yes Other anticoagulants/antiplatelets: no   Past Medical History:  Diagnosis Date  . Asthma   . Diabetes mellitus without complication ([place])   . Diarrhea   . Diverticulosis of colon (without mention of hemorrhage)   . Functional ovarian cysts   . GERD (gastroesophageal reflux disease)   . Hemorrhoids, external, without mention of complication   . Hormone replacement therapy (postmenopausal)   . Hypertension   . IBS  (irritable bowel syndrome)   . Migraine   . Right knee DJD   . Seizures ([place])   . Shortness of breath   . Sleep apnea    no cpap    study 2 yrs ago    Social History Social History   Tobacco Use  . Smoking status: Never Smoker  . Smokeless tobacco: Never Used  Substance Use Topics  . Alcohol use: No    Alcohol/week: 0.0 standard drinks  . Drug use: No    Family History Family History  Problem Relation Age of Onset  . Heart disease Mother   . Diabetes Mother   . Heart disease Brother   . Cancer Sister   . Heart disease Brother   . Heart disease Brother   . Heart disease Son     Surgical History Past Surgical History:  Procedure Laterality Date  . CHOLECYSTECTOMY  1980  . ENDARTERECTOMY Right 07/01/2017   Procedure: ENDARTERECTOMY CAROTID RIGHT;  Surgeon: Serafina Mitchell, MD;  Location: [place];  Service: Vascular;  Laterality: Right;  . LEFT HEART CATHETERIZATION WITH CORONARY ANGIOGRAM N/A 11/18/2013   Procedure: LEFT HEART CATHETERIZATION WITH CORONARY ANGIOGRAM;  Surgeon: Burnell Blanks, MD;  Location: [facility] CATH LAB;  Service: Cardiovascular;  Laterality: N/A;  . OOPHORECTOMY     2012 Dr. Adah Perl in [place]  . PATCH ANGIOPLASTY Right 07/01/2017   Procedure: RIGHT CAROTID PATCH ANGIOPLASTY;  Surgeon: Serafina Mitchell, MD;  Location: [place];  Service: Vascular;  Laterality: Right;  . TONSILLECTOMY    . TOTAL KNEE ARTHROPLASTY Right 09/02/2012   Procedure: TOTAL KNEE ARTHROPLASTY- right;  Surgeon: Ninetta Lights, MD;  Location: [place];  Service: Orthopedics;  Laterality: Right;  . TUBAL LIGATION    . TYMPANOMASTOIDECTOMY Left 04/02/2016   Procedure: LEFT CANAL WALL DOWN TYMPANOMASTOIDECTOMY;  Surgeon: Leta Baptist, MD;  Location: [place];  Service: ENT;  Laterality: Left;  [name] VAGINAL HYSTERECTOMY  1990  . WOUND EXPLORATION Right 07/02/2017   Procedure: RIGHT NECK EXPLORATION WITH INTRAOPERATIVE ULTRASOUND;  Surgeon: Angelia Mould, MD;  Location: [place];  Service: Vascular;  Laterality: Right;    Allergies  Allergen Reactions  . Iodinated Diagnostic Agents Hives    Current Outpatient Medications  Medication Sig Dispense Refill  . aspirin EC 81 MG tablet Take 81 mg by mouth daily.     . carvedilol (COREG) 3.125 MG tablet Take 3.125 mg by mouth 2 (two) times daily with a meal.    . cholecalciferol (VITAMIN D) 1000 UNITS tablet Take 1,000 Units by mouth daily.    . furosemide (LASIX) 40 MG tablet Take 40 mg by mouth 2 (two) times daily.     [name] lactulose (CHRONULAC) 10 GM/15ML solution Take 30 g by mouth 4 (four) times daily.    [name] lamoTRIgine (LAMICTAL) 200 MG tablet Take 200 mg by mouth 2 (two) times daily.     . Multiple Vitamin (MULTIVITAMIN) tablet Take 1 tablet by mouth daily.    . pantoprazole (PROTONIX) 40 MG tablet Take 40 mg by mouth once.    . potassium chloride SA (K-DUR,KLOR-CON) 20 MEQ tablet Take 20 mEq by mouth once.     No current facility-administered medications for this visit.     Review of Systems : See HPI for pertinent positives and negatives.  Physical Examination  Vitals:   09/11/18 0852 09/11/18 0855  BP: 120/68 116/72  Pulse: (!) 56 (!) 56  Resp: 16   Temp: 98.6 F (37 C)   TempSrc: Temporal   SpO2: 97%   Weight: 138 lb (62.6 kg)   Height: 5' 5"  (1.651 m)    Body mass index is 22.96 kg/m.  General: WDWN female in NAD GAIT: normal Eyes: PERRLA HENT: No gross abnormalities.  Pulmonary:  Respirations are non-labored, good air movement in all fields except diminished in right posterior fields, no rales, rhonchi, or  wheezing. Cardiac: regular rhythm, no detected murmur.  VASCULAR EXAM Carotid Bruits Right Left   Negative Negative     Abdominal aortic pulse is not palpable. Radial pulses are 2+ palpable and equal.                                                                                                                            LE Pulses Right Left       POPLITEAL  not palpable    not palpable       POSTERIOR  TIBIAL   palpable    palpable        DORSALIS PEDIS      ANTERIOR TIBIAL  palpable   palpable     Gastrointestinal: soft, nontender, BS WNL, no r/g, no palpable masses. Musculoskeletal: no muscle atrophy/wasting. M/S 5/5 throughout, extremities without ischemic changes. Skin: No rashes, no ulcers, no cellulitis.   Neurologic:  A&O X 3; appropriate affect, sensation is normal; speech is normal, CN 2-12 intact except has some hearing loss, pain and light touch intact in extremities, motor exam as listed above. Psychiatric: Normal thought content, mood appropriate to clinical situation.     DATA Carotid Duplex (09-11-18): Right Carotid: Patent right carotid endarterectomy site with no evidence of                restenosis or hyperplasia. Left Carotid: Velocities in the left ICA are consistent with a 1-39% stenosis.               The ECA appears <50% stenosed. Vertebrals:  Bilateral vertebral arteries demonstrate antegrade flow. Subclavians: Normal flow hemodynamics were seen in bilateral subclavian              arteries.    Assessment: Carol Richmond is a 68 y.o. female who is s/p right CEA by Dr. Trula Slade on 07/01/17 for symptomatic carotid artery stenosis.  The next day, she returned to the operating room and underwent exploration of right CEA site and intraoperative duplex for expressive aphasia and LLE weakness.  The CEA site was widely patent with good diastolic flow throughout and the intraoperative duplex showed no technical problems that could be identified.  She has not had any subsequent neurological events.  Today's carotid duplex shows 1-39% bilateral ICA stenosis.  She does not have DM and has never used tobacco.  She walks every morning.  She has a normal BMI.  She takes a daily 81 mg ASA and a statin.     Plan: Follow-up in 1 year with Carotid Duplex scan.   I discussed in depth with the patient the nature of atherosclerosis,  and emphasized the importance of maximal medical management including strict control of blood pressure, blood glucose, and lipid levels, obtaining regular exercise, and continued cessation of smoking.  The patient is aware that without maximal medical management the underlying atherosclerotic disease process will progress, limiting the benefit of any interventions. The patient was given information about stroke prevention and what symptoms should prompt the patient to seek immediate medical care. Thank you for allowing [place] to participate in this patient's care.  Clemon Chambers, RN, MSN, FNP-C [facility] Office: [phone]  Clinic Physician: Donzetta Matters  09/11/18 9:28 AM

## 2018-09-14 ENCOUNTER — Telehealth (INDEPENDENT_AMBULATORY_CARE_PROVIDER_SITE_OTHER): Payer: Self-pay | Admitting: Internal Medicine

## 2018-09-14 NOTE — Telephone Encounter (Signed)
Patient called stated she is to have a procedure and she would like to speak to you about it - ph# [phone]

## 2018-09-16 DIAGNOSIS — J449 Chronic obstructive pulmonary disease, unspecified: Secondary | ICD-10-CM | POA: Diagnosis not present

## 2018-09-16 DIAGNOSIS — Z96659 Presence of unspecified artificial knee joint: Secondary | ICD-10-CM | POA: Diagnosis not present

## 2018-09-16 DIAGNOSIS — I5020 Unspecified systolic (congestive) heart failure: Secondary | ICD-10-CM | POA: Diagnosis not present

## 2018-09-21 ENCOUNTER — Other Ambulatory Visit (HOSPITAL_COMMUNITY)
Admission: RE | Admit: 2018-09-21 | Discharge: 2018-09-21 | Disposition: A | Discharge status: Home / Self Care | Payer: PPO | Source: Ambulatory Visit | Attending: Internal Medicine | Admitting: Internal Medicine

## 2018-09-21 DIAGNOSIS — Z20828 Contact with and (suspected) exposure to other viral communicable diseases: Secondary | ICD-10-CM | POA: Diagnosis not present

## 2018-09-21 DIAGNOSIS — Z01812 Encounter for preprocedural laboratory examination: Secondary | ICD-10-CM | POA: Diagnosis not present

## 2018-09-21 LAB — SARS CORONAVIRUS 2 (TAT 6-24 HRS): SARS Coronavirus 2: NEGATIVE

## 2018-09-23 ENCOUNTER — Encounter (HOSPITAL_COMMUNITY): Payer: Self-pay | Admitting: *Deleted

## 2018-09-23 ENCOUNTER — Other Ambulatory Visit: Payer: Self-pay

## 2018-09-23 ENCOUNTER — Ambulatory Visit (HOSPITAL_COMMUNITY)
Admission: RE | Admit: 2018-09-23 | Discharge: 2018-09-23 | Disposition: A | Discharge status: Home / Self Care | Payer: PPO | Attending: Internal Medicine | Admitting: Internal Medicine

## 2018-09-23 ENCOUNTER — Encounter (HOSPITAL_COMMUNITY)
Admission: RE | Disposition: A | Discharge status: Home / Self Care | Payer: Self-pay | Source: Home / Self Care | Attending: Internal Medicine

## 2018-09-23 DIAGNOSIS — K219 Gastro-esophageal reflux disease without esophagitis: Secondary | ICD-10-CM | POA: Diagnosis not present

## 2018-09-23 DIAGNOSIS — D508 Other iron deficiency anemias: Secondary | ICD-10-CM

## 2018-09-23 DIAGNOSIS — D500 Iron deficiency anemia secondary to blood loss (chronic): Secondary | ICD-10-CM | POA: Diagnosis not present

## 2018-09-23 DIAGNOSIS — I10 Essential (primary) hypertension: Secondary | ICD-10-CM | POA: Diagnosis not present

## 2018-09-23 DIAGNOSIS — Z7982 Long term (current) use of aspirin: Secondary | ICD-10-CM | POA: Diagnosis not present

## 2018-09-23 DIAGNOSIS — Z79899 Other long term (current) drug therapy: Secondary | ICD-10-CM | POA: Insufficient documentation

## 2018-09-23 DIAGNOSIS — R195 Other fecal abnormalities: Secondary | ICD-10-CM | POA: Diagnosis not present

## 2018-09-23 DIAGNOSIS — E119 Type 2 diabetes mellitus without complications: Secondary | ICD-10-CM | POA: Insufficient documentation

## 2018-09-23 DIAGNOSIS — Z8719 Personal history of other diseases of the digestive system: Secondary | ICD-10-CM | POA: Diagnosis not present

## 2018-09-23 DIAGNOSIS — Z96651 Presence of right artificial knee joint: Secondary | ICD-10-CM | POA: Insufficient documentation

## 2018-09-23 DIAGNOSIS — D649 Anemia, unspecified: Secondary | ICD-10-CM | POA: Insufficient documentation

## 2018-09-23 DIAGNOSIS — R569 Unspecified convulsions: Secondary | ICD-10-CM | POA: Insufficient documentation

## 2018-09-23 DIAGNOSIS — G4730 Sleep apnea, unspecified: Secondary | ICD-10-CM | POA: Diagnosis not present

## 2018-09-23 DIAGNOSIS — D509 Iron deficiency anemia, unspecified: Secondary | ICD-10-CM | POA: Diagnosis present

## 2018-09-23 DIAGNOSIS — K259 Gastric ulcer, unspecified as acute or chronic, without hemorrhage or perforation: Secondary | ICD-10-CM | POA: Diagnosis not present

## 2018-09-23 DIAGNOSIS — K319 Disease of stomach and duodenum, unspecified: Secondary | ICD-10-CM | POA: Diagnosis not present

## 2018-09-23 HISTORY — PX: BIOPSY: SHX5522

## 2018-09-23 HISTORY — PX: ESOPHAGOGASTRODUODENOSCOPY: SHX5428

## 2018-09-23 SURGERY — EGD (ESOPHAGOGASTRODUODENOSCOPY)
Anesthesia: Moderate Sedation

## 2018-09-23 MED ORDER — MIDAZOLAM HCL 5 MG/5ML IJ SOLN
INTRAMUSCULAR | Status: AC
  Filled 2018-09-23: qty 10

## 2018-09-23 MED ORDER — LIDOCAINE VISCOUS HCL 2 % MT SOLN
OROMUCOSAL | Status: AC
  Filled 2018-09-23: qty 15

## 2018-09-23 MED ORDER — SODIUM CHLORIDE 0.9 % IV SOLN
INTRAVENOUS | Status: DC
  Administered 2018-09-23: 11:00:00 via INTRAVENOUS

## 2018-09-23 MED ORDER — PANTOPRAZOLE SODIUM 40 MG PO TBEC: 40.0000 mg | DELAYED_RELEASE_TABLET | Freq: Two times a day (BID) | ORAL | 2 refills | Status: DC

## 2018-09-23 MED ORDER — MEPERIDINE HCL 50 MG/ML IJ SOLN
INTRAMUSCULAR | Status: DC | PRN
  Administered 2018-09-23 (×2): 25 mg

## 2018-09-23 MED ORDER — MIDAZOLAM HCL 5 MG/5ML IJ SOLN
INTRAMUSCULAR | Status: DC | PRN
  Administered 2018-09-23 (×2): 2 mg via INTRAVENOUS

## 2018-09-23 MED ORDER — FLINTSTONES PLUS IRON PO CHEW: 1.0000 | CHEWABLE_TABLET | Freq: Two times a day (BID) | ORAL | Status: DC

## 2018-09-23 MED ORDER — STERILE WATER FOR IRRIGATION IR SOLN
Status: DC | PRN
  Administered 2018-09-23: 12:00:00 1.5 mL

## 2018-09-23 MED ORDER — MEPERIDINE HCL 50 MG/ML IJ SOLN
INTRAMUSCULAR | Status: AC
  Filled 2018-09-23: qty 1

## 2018-09-23 NOTE — Discharge Instructions (Signed)
No aspirin or NSAIDs for 24 hours. Increase pantoprazole to 40 mg by mouth 30 minutes before breakfast and evening meal daily. Flintstone chewable 1 tablet twice daily Resume other medications as before Resume usual diet. No driving for 24 hours. Physician will call with biopsy results.  Upper Endoscopy, Adult, Care After This sheet gives you information about how to care for yourself after your procedure. Your health care provider may also give you more specific instructions. If you have problems or questions, contact your health care provider. What can I expect after the procedure? After the procedure, it is common to have:  A sore throat.  Mild stomach pain or discomfort.  Bloating.  Nausea. Follow these instructions at home:   Follow instructions from your health care provider about what to eat or drink after your procedure.  Return to your normal activities as told by your health care provider. Ask your health care provider what activities are safe for you.  Take over-the-counter and prescription medicines only as told by your health care provider.  Do not drive for 24 hours if you were given a sedative during your procedure.  Keep all follow-up visits as told by your health care provider. This is important. Contact a health care provider if you have:  A sore throat that lasts longer than one day.  Trouble swallowing. Get help right away if:  You vomit blood or your vomit looks like coffee grounds.  You have: ? A fever. ? Bloody, black, or tarry stools. ? A severe sore throat or you cannot swallow. ? Difficulty breathing. ? Severe pain in your chest or abdomen. Summary  After the procedure, it is common to have a sore throat, mild stomach discomfort, bloating, and nausea.  Do not drive for 24 hours if you were given a sedative during the procedure.  Follow instructions from your health care provider about what to eat or drink after your procedure.  Return  to your normal activities as told by your health care provider. This information is not intended to replace advice given to you by your health care provider. Make sure you discuss any questions you have with your health care provider. Document Released: 08/06/2011 Document Revised: 07/29/2017 Document Reviewed: 07/07/2017 Elsevier Patient Education  2020 [employer].

## 2018-09-23 NOTE — Op Note (Signed)
[facility] Patient Name: Carol Richmond Procedure Date: 09/23/2018 11:35 AM MRN: [id number] Date of Birth: 05/13/1950 Attending MD: Hildred Laser , MD CSN: [id number] Age: 68 Admit Type: Outpatient Procedure:                Upper GI endoscopy Indications:              Iron deficiency anemia secondary to chronic blood                            loss Providers:                Hildred Laser, MD, Gerome Sam, RN, Nelma Rothman,                            Technician Referring MD:             Stoney Bang, MD Medicines:                Lidocaine spray, Meperidine 50 mg IV, Midazolam 7                            mg IV Complications:            No immediate complications. Estimated Blood Loss:     Estimated blood loss was minimal. Procedure:                Pre-Anesthesia Assessment:                           - Prior to the procedure, a History and Physical                            was performed, and patient medications and                            allergies were reviewed. The patient's tolerance of                            previous anesthesia was also reviewed. The risks                            and benefits of the procedure and the sedation                            options and risks were discussed with the patient.                            All questions were answered, and informed consent                            was obtained. Prior Anticoagulants: The patient has                            taken no previous anticoagulant or antiplatelet  agents except for aspirin. ASA Grade Assessment:                            III - A patient with severe systemic disease. After                            reviewing the risks and benefits, the patient was                            deemed in satisfactory condition to undergo the                            procedure.                           After obtaining informed consent, the endoscope was             passed under direct vision. Throughout the                            procedure, the patient's blood pressure, pulse, and                            oxygen saturations were monitored continuously. The                            GIF-H190 ([id number]) scope was introduced through the                            mouth, and advanced to the second part of duodenum.                            The upper GI endoscopy was accomplished without                            difficulty. The patient tolerated the procedure                            well. Scope In: 12:02:04 PM Scope Out: 12:17:55 PM Total Procedure Duration: 0 hours 15 minutes 51 seconds  Findings:      The examined esophagus was normal.      The Z-line was regular and was found 37 cm from the incisors.      Eight non-bleeding cratered and superficial gastric ulcers with no       stigmata of bleeding were found in the gastric antrum and in the       prepyloric region of the stomach. The largest lesion was 5 mm in largest       dimension. Biopsies were taken with a cold forceps for histology. The       pathology specimen was placed into Bottle Number 2.      The exam of the stomach was otherwise normal.      The duodenal bulb and second portion of the duodenum were normal.       Biopsies for histology were taken with a cold forceps for evaluation of  celiac disease. The pathology specimen was placed into Bottle Number 1. Impression:               - Normal esophagus.                           - Z-line regular, 37 cm from the incisors.                           - Non-bleeding gastric ulcers with no stigmata of                            bleeding. Biopsied.                           - Normal duodenal bulb and second portion of the                            duodenum. Biopsied. Moderate Sedation:      Moderate (conscious) sedation was administered by the endoscopy nurse       and supervised by the endoscopist. The following  parameters were       monitored: oxygen saturation, heart rate, blood pressure, CO2       capnography and response to care. Total physician intraservice time was       20 minutes. Recommendation:           - Patient has a contact number available for                            emergencies. The signs and symptoms of potential                            delayed complications were discussed with the                            patient. Return to normal activities tomorrow.                            Written discharge instructions were provided to the                            patient.                           - Resume previous diet today.                           - Continue present medications but increase                            Pantoprazole to 40 mg po bid.                           - Flintstone chewable one tablet twice daily.                           - No aspirin, ibuprofen,  naproxen, or other                            non-steroidal anti-inflammatory drugs for 1 day.                           - Await pathology results.                           - Repeat upper endoscopy in 3 months to check                            healing. Procedure Code(s):        --- Professional ---                           [phone], Esophagogastroduodenoscopy, flexible,                            transoral; with biopsy, single or multiple                           G0500, Moderate sedation services provided by the                            same physician or other qualified health care                            professional performing a gastrointestinal                            endoscopic service that sedation supports,                            requiring the presence of an independent trained                            observer to assist in the monitoring of the                            patient's level of consciousness and physiological                            status; initial 15 minutes of  intra-service time;                            patient age 70 years or older (additional time may                            be reported with [phone], as appropriate) Diagnosis Code(s):        --- Professional ---                           K25.9, Gastric ulcer, unspecified as acute or  chronic, without hemorrhage or perforation                           D50.0, Iron deficiency anemia secondary to blood                            loss (chronic) CPT copyright 2019 American Medical Association. All rights reserved. The codes documented in this report are preliminary and upon coder review may  be revised to meet current compliance requirements. Hildred Laser, MD Hildred Laser, MD 09/23/2018 12:31:11 PM This report has been signed electronically. Number of Addenda: 0

## 2018-09-23 NOTE — H&P (Signed)
Carol Richmond is an 68 y.o. female.   Chief Complaint: Patient is here for EGD. HPI: Patient is 68 year old Caucasian female who was found to have profound anemia by Dr. Jaquita Folds. on routine blood work.  Her hemoglobin was 7 g.  She has received 2 units of PRBCs.  She is also noted to have microcytic anemia over a year ago when she was hospitalized for right carotid endarterectomy and received blood transfusion. No history of melena or rectal bleeding hematuria or vaginal bleeding.  She says she was not having any symptoms such as weakness fatigue or lightheadedness.  Patient was seen the office about 6 weeks ago and her stool was guaiac negative.  3 Hemoccults were done and one was positive.  She is on low-dose aspirin but does not take other OTC NSAIDs.  She has a history of colonic polyps.  Last exam was in April 2019 by Dr. Valentino Saxon and it was normal examination. She has chronic GERD.  She feels heartburn is well controlled with therapy.  She denies dysphagia nausea vomiting or abdominal pain. Family history is negative for CRC.  Past Medical History:  Diagnosis Date  . Asthma   . Diabetes mellitus without complication ([place])   . Diarrhea   . Diverticulosis of colon (without mention of hemorrhage)   . Functional ovarian cysts   . GERD (gastroesophageal reflux disease)   . Hemorrhoids, external, without mention of complication   . Hormone replacement therapy (postmenopausal)   . Hypertension   . IBS (irritable bowel syndrome)   . Migraine   . Right knee DJD   . Seizures ([place])   . Shortness of breath   . Sleep apnea    no cpap    study 2 yrs ago    Past Surgical History:  Procedure Laterality Date  . CHOLECYSTECTOMY  1980  . ENDARTERECTOMY Right 07/01/2017   Procedure: ENDARTERECTOMY CAROTID RIGHT;  Surgeon: Serafina Mitchell, MD;  Location: [place];  Service: Vascular;  Laterality: Right;  . LEFT HEART CATHETERIZATION WITH CORONARY ANGIOGRAM N/A 11/18/2013   Procedure: LEFT  HEART CATHETERIZATION WITH CORONARY ANGIOGRAM;  Surgeon: Burnell Blanks, MD;  Location: [facility] CATH LAB;  Service: Cardiovascular;  Laterality: N/A;  . OOPHORECTOMY     2012 Dr. Adah Perl in [place]  . PATCH ANGIOPLASTY Right 07/01/2017   Procedure: RIGHT CAROTID PATCH ANGIOPLASTY;  Surgeon: Serafina Mitchell, MD;  Location: [facility] OR;  Service: Vascular;  Laterality: Right;  . TONSILLECTOMY    . TOTAL KNEE ARTHROPLASTY Right 09/02/2012   Procedure: TOTAL KNEE ARTHROPLASTY- right;  Surgeon: Ninetta Lights, MD;  Location: [place];  Service: Orthopedics;  Laterality: Right;  . TUBAL LIGATION    . TYMPANOMASTOIDECTOMY Left 04/02/2016   Procedure: LEFT CANAL WALL DOWN TYMPANOMASTOIDECTOMY;  Surgeon: Leta Baptist, MD;  Location: [place];  Service: ENT;  Laterality: Left;  [name] VAGINAL HYSTERECTOMY  1990  . WOUND EXPLORATION Right 07/02/2017   Procedure: RIGHT NECK EXPLORATION WITH INTRAOPERATIVE ULTRASOUND;  Surgeon: Angelia Mould, MD;  Location[facility] OR;  Service: Vascular;  Laterality: Right;    Family History  Problem Relation Age of Onset  . Heart disease Mother   . Diabetes Mother   . Heart disease Brother   . Cancer Sister   . Heart disease Brother   . Heart disease Brother   . Heart disease Son    Social History:  reports that she has never smoked. She has never used smokeless tobacco. She reports  that she does not drink alcohol or use drugs.  Allergies: No Known Allergies  Medications Prior to Admission  Medication Sig Dispense Refill  . acetaminophen (TYLENOL) 500 MG tablet Take 1,000 mg by mouth every 6 (six) hours as needed for moderate pain or headache.    . albuterol (VENTOLIN HFA) 108 (90 Base) MCG/ACT inhaler Inhale 2 puffs into the lungs every 4 (four) hours as needed for shortness of breath.    [name] aspirin EC 81 MG tablet Take 81 mg by mouth daily.     . carvedilol (COREG) 3.125 MG tablet Take 3.125 mg by mouth 2 (two) times daily with a meal.    . cholecalciferol  (VITAMIN D) 1000 UNITS tablet Take 1,000 Units by mouth daily.    . furosemide (LASIX) 40 MG tablet Take 40 mg by mouth 2 (two) times daily.     [name] lactulose (CHRONULAC) 10 GM/15ML solution Take 20 g by mouth 4 (four) times daily.     [name] lamoTRIgine (LAMICTAL) 200 MG tablet Take 200 mg by mouth 2 (two) times daily.     . Multiple Vitamin (MULTIVITAMIN) tablet Take 1 tablet by mouth daily.    [name] nystatin (MYCOSTATIN) 100000 UNIT/ML suspension Use as directed 15 mLs in the mouth or throat 2 (two) times daily as needed (thrush).     . ondansetron (ZOFRAN) 4 MG tablet Take 4 mg by mouth every 8 (eight) hours as needed for nausea or vomiting.    . pantoprazole (PROTONIX) 40 MG tablet Take 40 mg by mouth daily.     . potassium chloride SA (K-DUR,KLOR-CON) 20 MEQ tablet Take 20 mEq by mouth daily.       No results found for this or any previous visit (from the past 48 hour(s)). No results found.  ROS  Blood pressure 136/75, pulse 68, temperature 98.5 F (36.9 C), temperature source Oral, resp. rate 13, height 5' 5"  (1.651 m), weight 62.6 kg, SpO2 100 %. Physical Exam  Constitutional: She appears well-developed and well-nourished.  HENT:  Mouth/Throat: Oropharynx is clear and moist.  Eyes: Conjunctivae are normal. No scleral icterus.  Neck: No thyromegaly present.  Right carotid endarterectomy scar  Cardiovascular: Normal rate, regular rhythm and normal heart sounds.  No murmur heard. Respiratory: Effort normal and breath sounds normal.  GI:  Abdomen is symmetrical.  She has horizontal scar below the right costal margin.  Abdomen is soft and nontender with organomegaly or masses.  Musculoskeletal:        General: No edema.  Lymphadenopathy:    She has no cervical adenopathy.  Neurological: She is alert.  Skin: Skin is warm and dry.     Assessment/Plan Iron deficiency anemia. Heme positive stool. Patient had colonoscopy in April 2019. Diagnostic  EGD.  Hildred Laser, MD 09/23/2018,  11:50 AM

## 2018-09-24 NOTE — Telephone Encounter (Signed)
Done yesterday.

## 2018-10-01 DIAGNOSIS — H348111 Central retinal vein occlusion, right eye, with retinal neovascularization: Secondary | ICD-10-CM | POA: Diagnosis not present

## 2018-10-01 DIAGNOSIS — E119 Type 2 diabetes mellitus without complications: Secondary | ICD-10-CM | POA: Diagnosis not present

## 2018-10-01 DIAGNOSIS — H35033 Hypertensive retinopathy, bilateral: Secondary | ICD-10-CM | POA: Diagnosis not present

## 2018-10-01 DIAGNOSIS — H2512 Age-related nuclear cataract, left eye: Secondary | ICD-10-CM | POA: Diagnosis not present

## 2018-10-06 ENCOUNTER — Encounter (HOSPITAL_COMMUNITY): Payer: Self-pay | Admitting: Internal Medicine

## 2018-10-17 DIAGNOSIS — J449 Chronic obstructive pulmonary disease, unspecified: Secondary | ICD-10-CM | POA: Diagnosis not present

## 2018-10-17 DIAGNOSIS — Z96659 Presence of unspecified artificial knee joint: Secondary | ICD-10-CM | POA: Diagnosis not present

## 2018-10-17 DIAGNOSIS — I5020 Unspecified systolic (congestive) heart failure: Secondary | ICD-10-CM | POA: Diagnosis not present

## 2018-10-28 DIAGNOSIS — K2960 Other gastritis without bleeding: Secondary | ICD-10-CM | POA: Diagnosis not present

## 2018-10-28 DIAGNOSIS — Z6823 Body mass index (BMI) 23.0-23.9, adult: Secondary | ICD-10-CM | POA: Diagnosis not present

## 2018-11-12 ENCOUNTER — Ambulatory Visit (INDEPENDENT_AMBULATORY_CARE_PROVIDER_SITE_OTHER): Payer: PPO | Admitting: Otolaryngology

## 2018-11-12 DIAGNOSIS — H95122 Granulation of postmastoidectomy cavity, left ear: Secondary | ICD-10-CM

## 2018-11-17 DIAGNOSIS — I5020 Unspecified systolic (congestive) heart failure: Secondary | ICD-10-CM | POA: Diagnosis not present

## 2018-11-17 DIAGNOSIS — J449 Chronic obstructive pulmonary disease, unspecified: Secondary | ICD-10-CM | POA: Diagnosis not present

## 2018-11-17 DIAGNOSIS — Z96659 Presence of unspecified artificial knee joint: Secondary | ICD-10-CM | POA: Diagnosis not present

## 2018-11-19 ENCOUNTER — Other Ambulatory Visit: Payer: Self-pay

## 2018-11-19 DIAGNOSIS — Z20828 Contact with and (suspected) exposure to other viral communicable diseases: Secondary | ICD-10-CM | POA: Diagnosis not present

## 2018-11-19 DIAGNOSIS — Z20822 Contact with and (suspected) exposure to covid-19: Secondary | ICD-10-CM

## 2018-11-21 LAB — NOVEL CORONAVIRUS, NAA: SARS-CoV-2, NAA: DETECTED — AB

## 2018-12-02 DIAGNOSIS — Z6823 Body mass index (BMI) 23.0-23.9, adult: Secondary | ICD-10-CM | POA: Diagnosis not present

## 2018-12-02 DIAGNOSIS — M542 Cervicalgia: Secondary | ICD-10-CM | POA: Diagnosis not present

## 2018-12-07 DIAGNOSIS — M4802 Spinal stenosis, cervical region: Secondary | ICD-10-CM | POA: Diagnosis not present

## 2018-12-07 DIAGNOSIS — M542 Cervicalgia: Secondary | ICD-10-CM | POA: Diagnosis not present

## 2018-12-16 ENCOUNTER — Encounter (INDEPENDENT_AMBULATORY_CARE_PROVIDER_SITE_OTHER): Payer: Self-pay | Admitting: *Deleted

## 2018-12-17 DIAGNOSIS — I5020 Unspecified systolic (congestive) heart failure: Secondary | ICD-10-CM | POA: Diagnosis not present

## 2018-12-17 DIAGNOSIS — Z96659 Presence of unspecified artificial knee joint: Secondary | ICD-10-CM | POA: Diagnosis not present

## 2018-12-17 DIAGNOSIS — J449 Chronic obstructive pulmonary disease, unspecified: Secondary | ICD-10-CM | POA: Diagnosis not present

## 2018-12-23 DIAGNOSIS — Z6822 Body mass index (BMI) 22.0-22.9, adult: Secondary | ICD-10-CM | POA: Diagnosis not present

## 2018-12-23 DIAGNOSIS — M542 Cervicalgia: Secondary | ICD-10-CM | POA: Diagnosis not present

## 2018-12-23 DIAGNOSIS — K7689 Other specified diseases of liver: Secondary | ICD-10-CM | POA: Diagnosis not present

## 2018-12-23 DIAGNOSIS — I5042 Chronic combined systolic (congestive) and diastolic (congestive) heart failure: Secondary | ICD-10-CM | POA: Diagnosis not present

## 2018-12-23 DIAGNOSIS — Z0000 Encounter for general adult medical examination without abnormal findings: Secondary | ICD-10-CM | POA: Diagnosis not present

## 2018-12-24 ENCOUNTER — Telehealth (INDEPENDENT_AMBULATORY_CARE_PROVIDER_SITE_OTHER): Payer: Self-pay | Admitting: *Deleted

## 2018-12-24 NOTE — Telephone Encounter (Signed)
P/C from patient, she rec'd letter for repeat EGD - she wants to wait until after 1st of year, she lost her husband about a month ago, she will call us sometime after 1st of year

## 2019-01-07 DIAGNOSIS — Z6823 Body mass index (BMI) 23.0-23.9, adult: Secondary | ICD-10-CM | POA: Diagnosis not present

## 2019-01-07 DIAGNOSIS — J40 Bronchitis, not specified as acute or chronic: Secondary | ICD-10-CM | POA: Diagnosis not present

## 2019-01-17 DIAGNOSIS — Z96659 Presence of unspecified artificial knee joint: Secondary | ICD-10-CM | POA: Diagnosis not present

## 2019-01-17 DIAGNOSIS — I5020 Unspecified systolic (congestive) heart failure: Secondary | ICD-10-CM | POA: Diagnosis not present

## 2019-01-17 DIAGNOSIS — J449 Chronic obstructive pulmonary disease, unspecified: Secondary | ICD-10-CM | POA: Diagnosis not present

## 2019-01-25 ENCOUNTER — Ambulatory Visit (INDEPENDENT_AMBULATORY_CARE_PROVIDER_SITE_OTHER): Payer: PPO | Admitting: Internal Medicine

## 2019-01-25 ENCOUNTER — Encounter (INDEPENDENT_AMBULATORY_CARE_PROVIDER_SITE_OTHER): Payer: Self-pay | Admitting: Internal Medicine

## 2019-01-25 ENCOUNTER — Other Ambulatory Visit: Payer: Self-pay

## 2019-01-25 DIAGNOSIS — K279 Peptic ulcer, site unspecified, unspecified as acute or chronic, without hemorrhage or perforation: Secondary | ICD-10-CM | POA: Insufficient documentation

## 2019-01-25 DIAGNOSIS — B370 Candidal stomatitis: Secondary | ICD-10-CM | POA: Diagnosis not present

## 2019-01-25 DIAGNOSIS — K760 Fatty (change of) liver, not elsewhere classified: Secondary | ICD-10-CM

## 2019-01-25 DIAGNOSIS — D509 Iron deficiency anemia, unspecified: Secondary | ICD-10-CM | POA: Diagnosis not present

## 2019-01-25 MED ORDER — PANTOPRAZOLE SODIUM 40 MG PO TBEC: 40.0000 mg | DELAYED_RELEASE_TABLET | Freq: Two times a day (BID) | ORAL | 2 refills | Status: DC

## 2019-01-25 MED ORDER — NYSTATIN 100000 UNIT/ML MT SUSP: 5.0000 mL | Freq: Two times a day (BID) | OROMUCOSAL | 1 refills | Status: DC | PRN

## 2019-01-25 NOTE — Patient Instructions (Signed)
Patient to have blood work at Dr. Joselyn Arrow office. We will call patient when we get a copy of the results. Esophagogastroduodenoscopy to be scheduled first or second week of February 2021 Ultrasound with elastography will be scheduled following an EGD. Office visit in 6 months.

## 2019-01-25 NOTE — Progress Notes (Addendum)
Virtual Visit via Telephone Note  Patient had scheduled face-to-face office visit today.  Patient requested her be changed to virtual/telephone visit and I agreed to proceed. I connected with Carol Richmond on 01/25/19 at  3:19 PM EST by telephone and verified that I am speaking with the correct person using two identifiers. Patient tells me that she lost her husband in January 08, 2019 because of Covid pneumonia.  He had multiple other health issues.  Location: Patient: home                         Provider: office   I discussed the limitations, risks, security and privacy concerns of performing an evaluation and management service by telephone and the availability of in person appointments. I also discussed with the patient that there may be a patient responsible charge related to this service. The patient expressed understanding and agreed to proceed.   History of Present Illness:  Patient is 68 year old Caucasian female who was diagnosed with iron deficiency anemia back in in June 2020 when her hemoglobin was 7 g.  She received 2 units of PRBCs.  Patient has history of colonic polyps but her last colonoscopy by Dr. Elta Guadeloupe to medicine on 06/02/2017 was normal.  We therefore recommended a diagnostic esophagogastroduodenoscopy which was performed on 09/23/2018.  EGD revealed 8 superficial gastric ulcers at antrum with clean base.  Gastric mucosa was otherwise unremarkable.  Duodenal mucosa was normal.  Biopsy from gastric ulcer revealed benign etiology and H. pylori stains were negative.  Duodenal biopsy revealed no abnormality to suggest celiac disease.  Patient's PPI was doubled.  She is advised not to take OTC NSAIDs but continue with low-dose aspirin.  EGD was planned for last month but had to be postponed because she has been grieving over the loss of her husband  who died of Covid pneumonia in 01-08-19. Patient complains of oral thrush.  She says she was given medication by Dr. Sherrie Sport but it did not clear the infection.  She says she is diabetic no more since she lost close to 50 pounds. She feels heartburn is well controlled with therapy.  She denies nausea vomiting abdominal pain melena or rectal bleeding.  She is taking Flintstone chewable with iron.  She wants to know if she can take more than 1 Tylenol tablet per day.  She is taking 650 mg daily.  She says she is having neck pain secondary to arthritis.  She had x-rays recently.  She has not had any blood work recently. On review of patient's medications are noted she is on lactulose.  Patient tells me that she was diagnosed with cirrhosis last year when she was treated at [facility].  She has history of fatty liver.  She does not drink alcohol.  She presumed that her cirrhosis was secondary to one of her medications.  She has been on Lamictal for seizure disorder.  Her transaminases are mildly elevated and March 2015 but since then they have been normal.  Patient says she lost weight last year and try to keep it below 140.   Observations/Objective: Patient appeared to be upset when she talked about her late husband but by the time I ended the visit she was fine. Hemoglobin was 6.7 on 08/05/2018.   Assessment and Plan:  #1.  Iron deficiency anemia most likely secondary to GI blood loss due to multiple gastric ulcers.  She is on low-dose aspirin but not on any other  NSAIDs.  She is on double dose PPI and presently asymptomatic.  Follow-up EGD to document gastric healing ulcer was delayed because of personal issues.  She will continue pantoprazole at twice daily schedule until the time of next EGD. New prescription sent to patient's pharmacy for 60 doses with 2 refills.  #2.  Peptic ulcer disease.  Multiple gastric ulcers noted on EGD of 4 months ago.  Biopsy was negative for H. pylori infection.   She will undergo follow-up EGD as above in the next 2 months or so.  #3.  History of fatty liver.  Patient has been told that she has cirrhosis and hepatic encephalopathy for which she is taking lactulose.  This is new piece of information.  I presume etiology is fatty liver.  Extent of work-up is unknown.  I presume common etiologies have been ruled out when she was evaluated at [facility].  I do see that she had ultrasound on 08/08/2017 but did not see any other lab studies.  Will plan ultrasound with elastography next year as patient is reluctant to go to the hospital during Covid-19 pandemic.  #4.  Oral candidiasis.  Will treat with Mycostatin suspension 500,000 units swish and swallow 4 times a day for 12 days.  Follow Up Instructions:  Patient will continue Flintstone chewable with iron 2 tablets daily. Patient will continue pantoprazole at 40 mg p.o. twice daily. She can take Tylenol up to 2 g/day in divided dose for neck pain. She should refrain from using OTC NSAIDs such as Advil and Aleve. CBC and CMet.  Patient will have these studies done at Dr. Joselyn Arrow office as it is very convenient for her.  Esophagogastroduodenoscopy possibly in February 2021. Abdominal ultrasound with elastography to be performed before or after her next office visit. Office visit in 6 months. I discussed the assessment and treatment plan with the patient. The patient was provided an opportunity to ask questions and all were answered. The patient agreed with the plan and demonstrated an understanding of the instructions.   The patient was advised to call back or seek an in-person evaluation if the symptoms worsen or if the condition fails to improve as anticipated.  I provided 19 minutes of non-face-to-face time during this encounter.   Hildred Laser, MD

## 2019-02-05 DIAGNOSIS — D649 Anemia, unspecified: Secondary | ICD-10-CM | POA: Diagnosis not present

## 2019-02-16 DIAGNOSIS — Z96659 Presence of unspecified artificial knee joint: Secondary | ICD-10-CM | POA: Diagnosis not present

## 2019-02-16 DIAGNOSIS — I5020 Unspecified systolic (congestive) heart failure: Secondary | ICD-10-CM | POA: Diagnosis not present

## 2019-02-16 DIAGNOSIS — J449 Chronic obstructive pulmonary disease, unspecified: Secondary | ICD-10-CM | POA: Diagnosis not present

## 2019-02-24 DIAGNOSIS — Z6823 Body mass index (BMI) 23.0-23.9, adult: Secondary | ICD-10-CM | POA: Diagnosis not present

## 2019-02-24 DIAGNOSIS — M542 Cervicalgia: Secondary | ICD-10-CM | POA: Diagnosis not present

## 2019-03-02 ENCOUNTER — Telehealth (INDEPENDENT_AMBULATORY_CARE_PROVIDER_SITE_OTHER): Payer: Self-pay | Admitting: Internal Medicine

## 2019-03-02 NOTE — Telephone Encounter (Signed)
Patient has been called back x 3, each time there is no answer and unable to leave a message.  Patient will be called again tomorrow.

## 2019-03-02 NOTE — Telephone Encounter (Signed)
Patient would like a call back regarding labs - ph# [phone]

## 2019-03-03 NOTE — Telephone Encounter (Signed)
Patient was called and made aware that her lab work was good, and she was to continue the [place] with Iron.  Patient shares that she has a thrush on her tongue. Dr.Rehman has called her in a mouthwash as has her PCP. She states that it is still there.  Patient was offered appointment to come in 03/04/19 to see PA but she stated that she could not.  Patient was given appointment for 03/08/2019.

## 2019-03-08 ENCOUNTER — Ambulatory Visit (INDEPENDENT_AMBULATORY_CARE_PROVIDER_SITE_OTHER): Payer: PPO | Admitting: Gastroenterology

## 2019-03-08 ENCOUNTER — Telehealth (INDEPENDENT_AMBULATORY_CARE_PROVIDER_SITE_OTHER): Payer: Self-pay | Admitting: Gastroenterology

## 2019-03-08 ENCOUNTER — Other Ambulatory Visit: Payer: Self-pay

## 2019-03-08 ENCOUNTER — Encounter (INDEPENDENT_AMBULATORY_CARE_PROVIDER_SITE_OTHER): Payer: Self-pay | Admitting: Gastroenterology

## 2019-03-08 VITALS — BP 144/74 | HR 69 | Temp 97.0°F | Ht 65.0 in | Wt 143.2 lb

## 2019-03-08 DIAGNOSIS — K760 Fatty (change of) liver, not elsewhere classified: Secondary | ICD-10-CM

## 2019-03-08 DIAGNOSIS — K7460 Unspecified cirrhosis of liver: Secondary | ICD-10-CM | POA: Diagnosis not present

## 2019-03-08 DIAGNOSIS — K146 Glossodynia: Secondary | ICD-10-CM

## 2019-03-08 MED ORDER — MAGIC MOUTHWASH: 5.0000 mL | Freq: Three times a day (TID) | ORAL | Status: DC | PRN

## 2019-03-08 NOTE — Progress Notes (Signed)
Patient profile: Carol Richmond is a 69 y.o. female seen for evaluation of tongue pain. Last seen in clinic via telephone in Dec 2020.   History of Present Illness: Carol Richmond is seen today for issues with tongue pain.  She reports this began last spring around May, she has been seen in the urgent care multiple times. She reports trying courses per chart review of chlorhexidine rinse, oral Diflucan, nystatin rinse, none of which have helped the pain.  Describes pain mainly as burning. At one point she may have had a whitish coating consistent with thrush but this is not noted today. She denies any issues with taste.  Biggest symptom seems to be the burning discomfort, she does not have a lot of hot beverages.  Drinking cold water or cough drop will help tongue pain at times  She denies any GERD, nausea, vomiting, epigastric pain.  She has been taking the PPI twice a day since her upper endoscopy as below with the ulcers.  She has not taken any NSAIDs.  She is taking her Flintstone vitamin and her B12.  She denies any nausea, vomiting, dysphagia, epigastric pain.  Appetite good.  She reports having at least 3-4 loose watery stools a day due to the lactulose 4 times a day, some days may have more stools than that-worse after bigger meals.  She denies any abdominal pain.  No rectal bleeding or dark stools.  She takes lactulose 4 times a day.  Wt Readings from Last 3 Encounters:  03/08/19 143 lb 3.2 oz (65 kg)  01/25/19 138 lb (62.6 kg)  09/23/18 138 lb (62.6 kg)     Last Colonoscopy: April 2019-normal Last Endoscopy:  August 2020-regular Z-line, 8 nonbleeding superficial gastric ulcers, 5 mm largest, stomach otherwise normal.   Past Medical History:  Past Medical History:  Diagnosis Date  . Asthma   . Diabetes mellitus without complication ([place])   . Diarrhea   . Diverticulosis of colon (without mention of hemorrhage)   . Functional ovarian cysts   . GERD (gastroesophageal  reflux disease)   . Hemorrhoids, external, without mention of complication   . Hormone replacement therapy (postmenopausal)   . Hypertension   . IBS (irritable bowel syndrome)   . Migraine   . Right knee DJD   . Seizures ([place])   . Shortness of breath   . Sleep apnea    no cpap    study 2 yrs ago    Problem List: Patient Active Problem List   Diagnosis Date Noted  . Peptic ulcer disease 01/25/2019  . Iron deficiency anemia 01/25/2019  . Oral candidiasis 01/25/2019  . Fatty liver disease, nonalcoholic 27/07/2374  . Absolute anemia 08/17/2018  . Carotid artery stenosis, symptomatic, right 07/01/2017  . Stenosis of right carotid artery 06/23/2017  . Orthostatic tremor 06/23/2017  . Transient left leg weakness 08/16/2016  . Anxiety state 07/01/2016  . Mild neurocognitive disorder 05/02/2016  . Migraine without aura and without status migrainosus, not intractable 01/27/2015  . Depression 07/25/2014  . Chronic daily headache 12/03/2013  . Transient alteration of awareness 05/25/2013  . Osteoarthritis of right knee 09/03/2012  . Right knee DJD   . IBS (irritable bowel syndrome)   . Diverticulosis of colon (without mention of hemorrhage)   . GERD (gastroesophageal reflux disease)   . Hypertension   . Hormone replacement therapy (postmenopausal)   . Migraine   . Seizures ([place])   . Hemorrhoids, external, without mention of complication   .  Hemorrhoids, external, without mention of complication   . SHOULDER PAIN 08/24/2008  . IMPINGEMENT SYNDROME 08/24/2008    Past Surgical History: Past Surgical History:  Procedure Laterality Date  . BIOPSY  09/23/2018   Procedure: BIOPSY;  Surgeon: Rogene Houston, MD;  Location: [facility];  Service: Endoscopy;;  duodeneum and stomach  . CHOLECYSTECTOMY  1980  . ENDARTERECTOMY Right 07/01/2017   Procedure: ENDARTERECTOMY CAROTID RIGHT;  Surgeon: Serafina Mitchell, MD;  Location: [facility] OR;  Service: Vascular;  Laterality: Right;  .  ESOPHAGOGASTRODUODENOSCOPY N/A 09/23/2018   Procedure: ESOPHAGOGASTRODUODENOSCOPY (EGD);  Surgeon: Rogene Houston, MD;  Location: [facility];  Service: Endoscopy;  Laterality: N/A;  2:45-moved to 1:45 per Lelon Frohlich  . LEFT HEART CATHETERIZATION WITH CORONARY ANGIOGRAM N/A 11/18/2013   Procedure: LEFT HEART CATHETERIZATION WITH CORONARY ANGIOGRAM;  Surgeon: Burnell Blanks, MD;  Location: [facility] CATH LAB;  Service: Cardiovascular;  Laterality: N/A;  . OOPHORECTOMY     2012 Dr. Adah Perl in [place]  . PATCH ANGIOPLASTY Right 07/01/2017   Procedure: RIGHT CAROTID PATCH ANGIOPLASTY;  Surgeon: Serafina Mitchell, MD;  Location: [facility] OR;  Service: Vascular;  Laterality: Right;  . TONSILLECTOMY    . TOTAL KNEE ARTHROPLASTY Right 09/02/2012   Procedure: TOTAL KNEE ARTHROPLASTY- right;  Surgeon: Ninetta Lights, MD;  Location: [place];  Service: Orthopedics;  Laterality: Right;  . TUBAL LIGATION    . TYMPANOMASTOIDECTOMY Left 04/02/2016   Procedure: LEFT CANAL WALL DOWN TYMPANOMASTOIDECTOMY;  Surgeon: Leta Baptist, MD;  Location: [facility];  Service: ENT;  Laterality: Left;  [name] VAGINAL HYSTERECTOMY  1990  . WOUND EXPLORATION Right 07/02/2017   Procedure: RIGHT NECK EXPLORATION WITH INTRAOPERATIVE ULTRASOUND;  Surgeon: Angelia Mould, MD;  Location: [place];  Service: Vascular;  Laterality: Right;    Allergies: No Known Allergies    Home Medications:  Current Outpatient Medications:  .  Acetaminophen (TYLENOL ARTHRITIS EXT RELIEF PO), Take 650 mg by mouth daily., Disp: , Rfl:  .  albuterol (VENTOLIN HFA) 108 (90 Base) MCG/ACT inhaler, Inhale 2 puffs into the lungs every 4 (four) hours as needed for shortness of breath., Disp: , Rfl:  .  aspirin EC 81 MG tablet, Take 1 tablet (81 mg total) by mouth daily., Disp:  , Rfl:  .  carvedilol (COREG) 3.125 MG tablet, Take 3.125 mg by mouth 2 (two) times daily with a meal., Disp: , Rfl:  .  cholecalciferol (VITAMIN D) 1000 UNITS tablet, Take 1,000 Units by  mouth daily., Disp: , Rfl:  .  furosemide (LASIX) 40 MG tablet, Take 40 mg by mouth 2 (two) times daily. , Disp: , Rfl:  .  lactulose (CHRONULAC) 10 GM/15ML solution, Take 20 g by mouth 4 (four) times daily. , Disp: , Rfl:  .  lamoTRIgine (LAMICTAL) 200 MG tablet, Take 200 mg by mouth 2 (two) times daily. , Disp: , Rfl:  .  LORazepam (ATIVAN) 0.5 MG tablet, Take 0.5 mg by mouth 2 (two) times daily as needed., Disp: , Rfl:  .  mirtazapine (REMERON) 7.5 MG tablet, Take 7.5 mg by mouth at bedtime., Disp: , Rfl:  .  Multiple Vitamin (MULTIVITAMIN) tablet, Take 1 tablet by mouth daily., Disp: , Rfl:  .  nystatin (MYCOSTATIN) 100000 UNIT/ML suspension, Use as directed 5 mLs (500,000 Units total) in the mouth or throat 2 (two) times daily as needed (thrush)., Disp: 60 mL, Rfl: 1 .  ondansetron (ZOFRAN) 4 MG tablet, Take 4 mg by mouth every  8 (eight) hours as needed for nausea or vomiting., Disp: , Rfl:  .  pantoprazole (PROTONIX) 40 MG tablet, Take 1 tablet (40 mg total) by mouth 2 (two) times daily before a meal., Disp: 60 tablet, Rfl: 2 .  Pediatric Multivitamins-Iron (FLINTSTONES PLUS IRON) chewable tablet, Chew 1 tablet by mouth 2 (two) times daily., Disp: , Rfl:  .  potassium chloride SA (K-DUR,KLOR-CON) 20 MEQ tablet, Take 20 mEq by mouth daily. , Disp: , Rfl:   Current Facility-Administered Medications:  .  magic mouthwash, 5 mL, Oral, TID PRN, Minus Liberty, PA-C   Family History: family history includes Cancer in her sister; Diabetes in her mother; Heart disease in her brother, brother, brother, mother, and son.  Denies family of colon polyps or colon cancer  Social History:   reports that she has never smoked. She has never used smokeless tobacco. She reports that she does not drink alcohol or use drugs.   Review of Systems: Constitutional: Denies weight loss/weight gain  Eyes: No changes in vision. ENT: No oral lesions, sore throat.  GI: see HPI.  Heme/Lymph: No easy bruising.    CV: No chest pain.  GU: No hematuria.  Integumentary: No rashes.  Neuro: No headaches.  Psych: No depression/anxiety.  Endocrine: No heat/cold intolerance.  Allergic/Immunologic: No urticaria.  Resp: No cough, SOB.  Musculoskeletal: No joint swelling.    Physical Examination: BP (!) 144/74 (BP Location: Right Arm, Patient Position: Sitting, Cuff Size: Large)   Pulse 69   Temp (!) 97 F (36.1 C) (Temporal)   Ht 5' 5"  (1.651 m)   Wt 143 lb 3.2 oz (65 kg)   BMI 23.83 kg/m  Gen: NAD, alert and oriented x 4 HEENT: midline tongue fissure, no obvious oral thrush Neck: supple, no JVD Chest: CTA bilaterally, no wheezes, crackles, or other adventitious sounds CV: RRR, no m/g/c/r Abd: soft, NT, ND, +BS in all four quadrants; no HSM, guarding, ridigity, or rebound tenderness Ext: no edema, well perfused with 2+ pulses, Skin: no rash or lesions noted on observed skin Lymph: no noted LAD  Data Reviewed:  07/2018-CBC with hemoglobin 11, MCV 74, ferritin 15, B12 normal.  Platelet 205  Updated labs to be scanned into chart.   [place] 07/2017-IMPRESSION: Cirrhosis with small volume of ascites.  Assessment/Plan: Ms. Guyton is a 69 y.o. female   1.  Anemia-per notes hemoglobin was 7 and required 2 unit transfusion June 2020.  Was also iron deficient.  Recent EGD with gastric ulcers, she is now on PPI twice daily.  She declines repeat upper endoscopy to verify healing given Covid.  She prefers to stay on PPI and may be schedule repeat EGD in a few months.  She had repeat iron and B12 labs at her PCP-results to be scanned to epic.  She has known iron and B12 supplements  2.  Cirrhosis-noted on prior ultrasound over a year ago.  She reports was told in the past this may be fatty liver and due to 1 medication.  I do not see where she has had a full serologic work-up including viral serologies, autoimmune markers, etc-would recommend at next visit.  Reviewed she is overdue for an ultrasound but she  prefers to avoid the hospital and declines scheduling ultrasound today.  Will check ammonia AFP and INR today  3.  Tongue pain-midline fissure visualized on exam, no evidence of oral thrush on exam. Case reviewed and patient examined with Dr. Laural Golden. We will try [place] Magic mouthwash on  a as needed basis. This was verbally called into her pharmacy to compound. Recommend f/up with ENT  4.  Diarrhea-likely due to lactulose 4 times a day.  Reports she has been hospitalized in the past for encephalopathy.  If ammonia level normal will try decreasing to lactulose 3 times daily.  Coila was seen today for follow-up.  Diagnoses and all orders for this visit:  Fatty liver disease, nonalcoholic -     INR/PT -     AFP tumor marker -     Ammonia  Cirrhosis of liver without ascites, unspecified hepatic cirrhosis type ([place]) -     INR/PT -     AFP tumor marker -     Ammonia  Tongue pain -     magic mouthwash     Patient would like to call when Covid situation improves and needs ultrasound and EGD when ready to schedule   I personally performed the service, non-incident to. (WP)  Laurine Blazer, [facility]

## 2019-03-08 NOTE — Telephone Encounter (Signed)
patient called stated she was at her pharmacy and they did not have anything for her - please advise - [phone]

## 2019-03-08 NOTE — Patient Instructions (Addendum)
We are checking liver labs including ammonia level to make sure dose of lactulose is okay. We will call w/ results.   Please contact us when ready to schedule ultrasound or repeat upper endoscopy. Continue avoiding advil, goody's, BCs, excedrin, ibuprofen, etc.   Try starting a B complex vitamin - this over the counter

## 2019-03-09 LAB — AMMONIA: Ammonia: 43 umol/L (ref <=72)

## 2019-03-09 LAB — PROTIME-INR
INR: 1
Prothrombin Time: 10.8 s (ref 9.0–11.5)

## 2019-03-09 LAB — AFP TUMOR MARKER: AFP-Tumor Marker: 2.6 ng/mL

## 2019-03-09 NOTE — Telephone Encounter (Signed)
Rx verbally called into pharmacy

## 2019-03-10 ENCOUNTER — Other Ambulatory Visit (INDEPENDENT_AMBULATORY_CARE_PROVIDER_SITE_OTHER): Payer: Self-pay | Admitting: *Deleted

## 2019-03-10 DIAGNOSIS — K7460 Unspecified cirrhosis of liver: Secondary | ICD-10-CM

## 2019-03-15 ENCOUNTER — Other Ambulatory Visit (INDEPENDENT_AMBULATORY_CARE_PROVIDER_SITE_OTHER): Payer: Self-pay | Admitting: *Deleted

## 2019-03-15 DIAGNOSIS — K7460 Unspecified cirrhosis of liver: Secondary | ICD-10-CM

## 2019-03-17 ENCOUNTER — Telehealth (INDEPENDENT_AMBULATORY_CARE_PROVIDER_SITE_OTHER): Payer: Self-pay | Admitting: Gastroenterology

## 2019-03-17 DIAGNOSIS — M542 Cervicalgia: Secondary | ICD-10-CM | POA: Diagnosis not present

## 2019-03-17 DIAGNOSIS — F330 Major depressive disorder, recurrent, mild: Secondary | ICD-10-CM | POA: Diagnosis not present

## 2019-03-17 DIAGNOSIS — Z6823 Body mass index (BMI) 23.0-23.9, adult: Secondary | ICD-10-CM | POA: Diagnosis not present

## 2019-03-17 NOTE — Telephone Encounter (Signed)
Patient called regarding test results - ph# [phone]

## 2019-03-18 ENCOUNTER — Telehealth (INDEPENDENT_AMBULATORY_CARE_PROVIDER_SITE_OTHER): Payer: Self-pay | Admitting: Gastroenterology

## 2019-03-18 NOTE — Telephone Encounter (Signed)
Patient called wants to discuss her lab work - she has questions - please advise - [phone]

## 2019-03-18 NOTE — Telephone Encounter (Signed)
I called patient - she has confusion about ammonia level, she wants to continue lactulose at her current dose - therefore she can hold off on repeat ammonia level. Initially planned to decrease dose of lactulose given diarrhea but she feels diarrhea is tolerable and will continue current dose.   I do want to see her in office in 6 months for f/up, please schedule appt.

## 2019-03-18 NOTE — Telephone Encounter (Signed)
We called patient with lab results last week and discussed. Please notify her her labs look good, since her ammonia level was normal and she was having loose stools we had recommended decreasing lactulose from 4 times a day to 3 times a day, she is to have a repeat ammonia in 1 to 2 weeks which has already been ordered in chart.

## 2019-03-19 NOTE — Telephone Encounter (Signed)
Patient was called x 2. She has a smart call blocker on her phone and is not taking our call.

## 2019-03-19 NOTE — Telephone Encounter (Signed)
I was able to talk to her via phone yesterday and all questions answered. Just needs OV in 6 months for f/up

## 2019-03-19 NOTE — Telephone Encounter (Signed)
OV in 6 months per Thayer Headings.

## 2019-04-02 ENCOUNTER — Telehealth (INDEPENDENT_AMBULATORY_CARE_PROVIDER_SITE_OTHER): Payer: Self-pay | Admitting: *Deleted

## 2019-04-02 NOTE — Telephone Encounter (Signed)
Please let patient know I can not e-prescribe this medication but I did call in a verbal order to pharmacy this morning and they will compound. She can call later today and check when it will be ready. Also please remind her Dr Laural Golden wanted her to see ENT for tongue pain. Thanks.

## 2019-04-02 NOTE — Telephone Encounter (Signed)
Patient aware.

## 2019-04-02 NOTE — Telephone Encounter (Signed)
Patient would like refill on magic mouth wash sent to [facility] - stated it is working

## 2019-04-07 DIAGNOSIS — I10 Essential (primary) hypertension: Secondary | ICD-10-CM | POA: Diagnosis not present

## 2019-04-07 DIAGNOSIS — F330 Major depressive disorder, recurrent, mild: Secondary | ICD-10-CM | POA: Diagnosis not present

## 2019-04-07 DIAGNOSIS — Z6823 Body mass index (BMI) 23.0-23.9, adult: Secondary | ICD-10-CM | POA: Diagnosis not present

## 2019-04-07 DIAGNOSIS — M542 Cervicalgia: Secondary | ICD-10-CM | POA: Diagnosis not present

## 2019-04-26 DIAGNOSIS — H35033 Hypertensive retinopathy, bilateral: Secondary | ICD-10-CM | POA: Diagnosis not present

## 2019-04-26 DIAGNOSIS — H524 Presbyopia: Secondary | ICD-10-CM | POA: Diagnosis not present

## 2019-04-27 ENCOUNTER — Telehealth (INDEPENDENT_AMBULATORY_CARE_PROVIDER_SITE_OTHER): Payer: Self-pay | Admitting: Internal Medicine

## 2019-04-27 NOTE — Telephone Encounter (Signed)
Patient called stated she would like a refill on the Magic Mouthwash she was prescribed - please advise - [phone]

## 2019-04-28 ENCOUNTER — Other Ambulatory Visit (INDEPENDENT_AMBULATORY_CARE_PROVIDER_SITE_OTHER): Payer: Self-pay | Admitting: *Deleted

## 2019-04-28 NOTE — Telephone Encounter (Signed)
A refill has been called to the patient's pharmacy. Please let her know.

## 2019-04-28 NOTE — Telephone Encounter (Signed)
A refill have been called to [facility] at [facility]

## 2019-04-30 DIAGNOSIS — H95121 Granulation of postmastoidectomy cavity, right ear: Secondary | ICD-10-CM | POA: Diagnosis not present

## 2019-04-30 DIAGNOSIS — K146 Glossodynia: Secondary | ICD-10-CM | POA: Diagnosis not present

## 2019-05-05 DIAGNOSIS — M542 Cervicalgia: Secondary | ICD-10-CM | POA: Diagnosis not present

## 2019-05-05 DIAGNOSIS — M62838 Other muscle spasm: Secondary | ICD-10-CM | POA: Diagnosis not present

## 2019-05-05 DIAGNOSIS — K121 Other forms of stomatitis: Secondary | ICD-10-CM | POA: Diagnosis not present

## 2019-05-13 IMAGING — CT CT ANGIO NECK
1 of 12 series · 5 of 34 positions shown · IV contrast (APPLIED)
Comparison: CTA head and neck 06/14/2017

CLINICAL DATA: Postop day 1 right carotid endarterectomy. Patient
lethargic

EXAM:
CT ANGIOGRAPHY HEAD AND NECK
TECHNIQUE: Multidetector CT imaging of the head and neck was performed using
the standard protocol during bolus administration of intravenous
contrast. Multiplanar CT image reconstructions and MIPs were
obtained to evaluate the vascular anatomy. Carotid stenosis
measurements (when applicable) are obtained utilizing NASCET
criteria, using the distal internal carotid diameter as the
denominator.
CONTRAST:  50mL UW5ETQ-EZD IOPAMIDOL (UW5ETQ-EZD) INJECTION 76%,

[Series 13: ax thins · axial · 0.39mm/px · z∈[-319,-74]mm · 5 of 369 slices shown]
[im 62/369  soft-tissue]
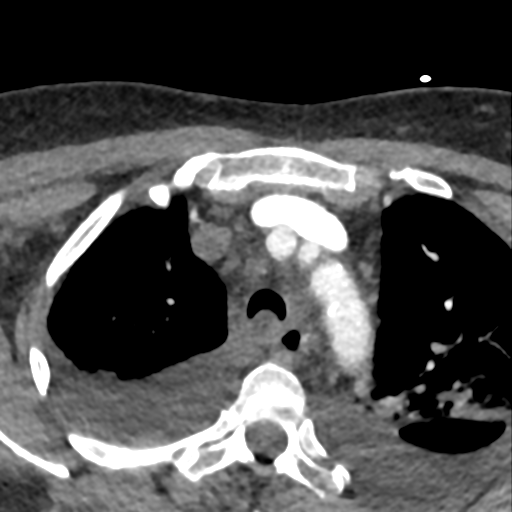
[im 123/369  bone]
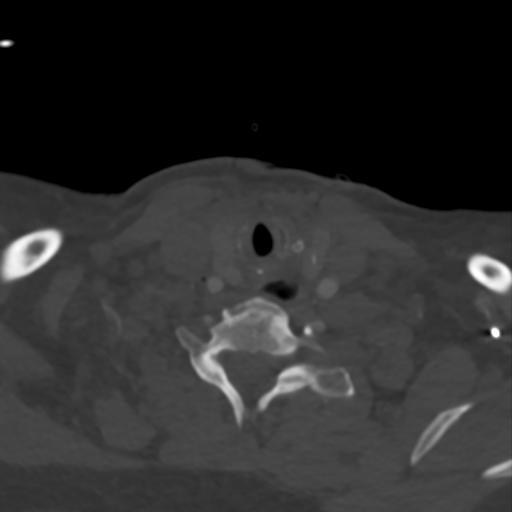
[im 185/369  soft-tissue]
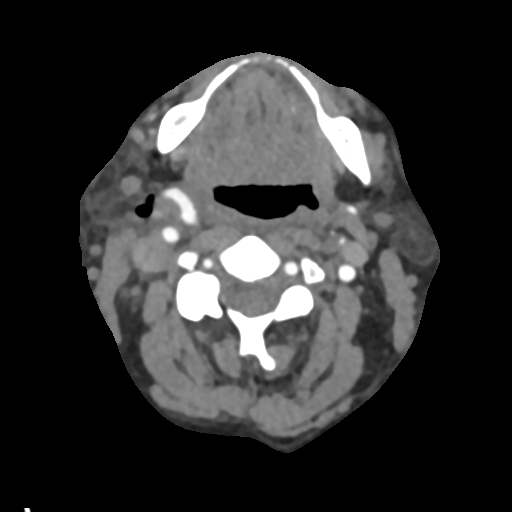
[im 246/369  bone]
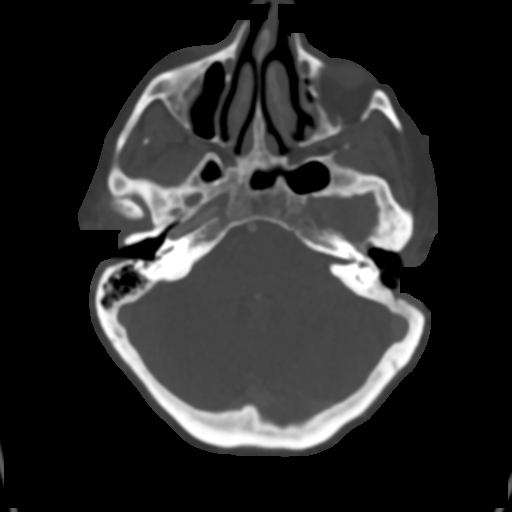
[im 307/369  soft-tissue]
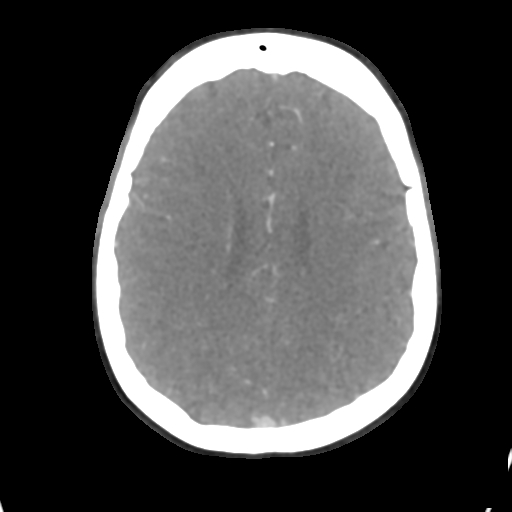

[5 of 34 positions shown; findings below may reference images not displayed]

FINDINGS: CT HEAD FINDINGS

Brain: Ventricle size normal. Chronic microvascular ischemic changes
in the white matter right greater than left are stable. No acute
infarct. Negative for hemorrhage or mass.

Vascular: Negative for hyperdense vessel

Skull: Negative

Sinuses: Mild mucosal edema in the paranasal sinuses bilaterally.

Orbits: Negative

Review of the MIP images confirms the above findings

CTA NECK FINDINGS

Aortic arch: Mild atherosclerotic calcification aortic arch and
proximal great vessels without significant stenosis

Right carotid system: Postop carotid endarterectomy on the right
with gas bubbles in the soft tissues as well as mild soft tissue
edema. No significant right carotid stenosis. No extravasation or
thrombus within the lumen. Right internal carotid artery shows
improved caliber since the prior preop study however remains
narrowed through the petrous and cavernous segment due to
atherosclerotic disease.

Left carotid system: Mild atherosclerotic disease at the left
carotid bifurcation. Less than 25% diameter stenosis left internal
carotid artery

Vertebral arteries: Left vertebral artery dominant. Both vertebral
arteries are patent to the basilar without stenosis.

Skeleton: Cervical spine degenerative changes. No acute skeletal
abnormality.

Other neck: Postop gas in the right neck from carotid
endarterectomy. No focal fluid collection or hematoma. Soft tissue
edema extends into the region of the larynx on the right displacing
the larynx to the left. No airway compromise

Upper chest: Moderate right pleural effusion and small left pleural
effusion. Mild atelectasis dependently bilaterally.

Mediastinal lymph nodes similar to the prior study. 13 mm and 12 mm
anterior mediastinal lymph nodes unchanged. Multiple right
paratracheal lymph nodes measuring 13 and 11 mm. Right lower neck
lymph nodes measuring 8 mm.

Review of the MIP images confirms the above findings

CTA HEAD FINDINGS

Anterior circulation: Right internal carotid artery is smaller in
caliber than the left internal carotid artery due to severe stenosis
in the petrous and cavernous segment. Right internal carotid artery
caliber is significantly improved from the preop study when a string
sign was present. Diffuse atherosclerotic disease throughout the
petrous and cavernous segment with moderate to severe stenosis on
the right. Right anterior and middle cerebral arteries patent
without significant stenosis.

Atherosclerotic calcification in the left cavernous carotid with
mild stenosis. Left anterior and middle cerebral arteries patent
without stenosis.

Posterior circulation: Both vertebral arteries patent to the
basilar. Mild stenosis in the mid basilar. Right PICA patent.
Minimal left PICA visualized. Superior cerebellar and posterior
cerebral arteries patent without significant stenosis.

Venous sinuses: Patent

Anatomic variants: None

Delayed phase: Normal enhancement on delayed imaging

Review of the MIP images confirms the above findings
IMPRESSION: 1. Negative for acute infarct. Chronic microvascular ischemic
changes in the white matter stable from the prior CT
2. Postop right carotid endarterectomy which is widely patent
without thrombus or residual stenosis at the surgical site. There
remains atherosclerotic disease in the petrous and cavernous segment
causing moderate to severe stenosis. Right internal carotid artery
caliber is improved from the preop study but remains smaller than
the left internal carotid artery due to stenosis through the right
skull base.
3. Postoperative edema in the right neck and right larynx with
displacement of the airway to the left. No airway compromise
4. Less than 25% diameter stenosis left internal carotid artery
bifurcation.
5. Bilateral pleural effusions and dependent atelectasis. Chest
x-ray recommended to evaluate for heart failure
6. Mediastinal adenopathy of uncertain significance, unchanged from
the prior CT.

## 2019-05-14 IMAGING — DX DG CHEST 2V
2 series · 2 of 2 positions shown · non-contrast
Comparison: 11/08/2016

CLINICAL DATA: Atelectasis, cough today

EXAM:
CHEST - 2 VIEW

[x chest ap]
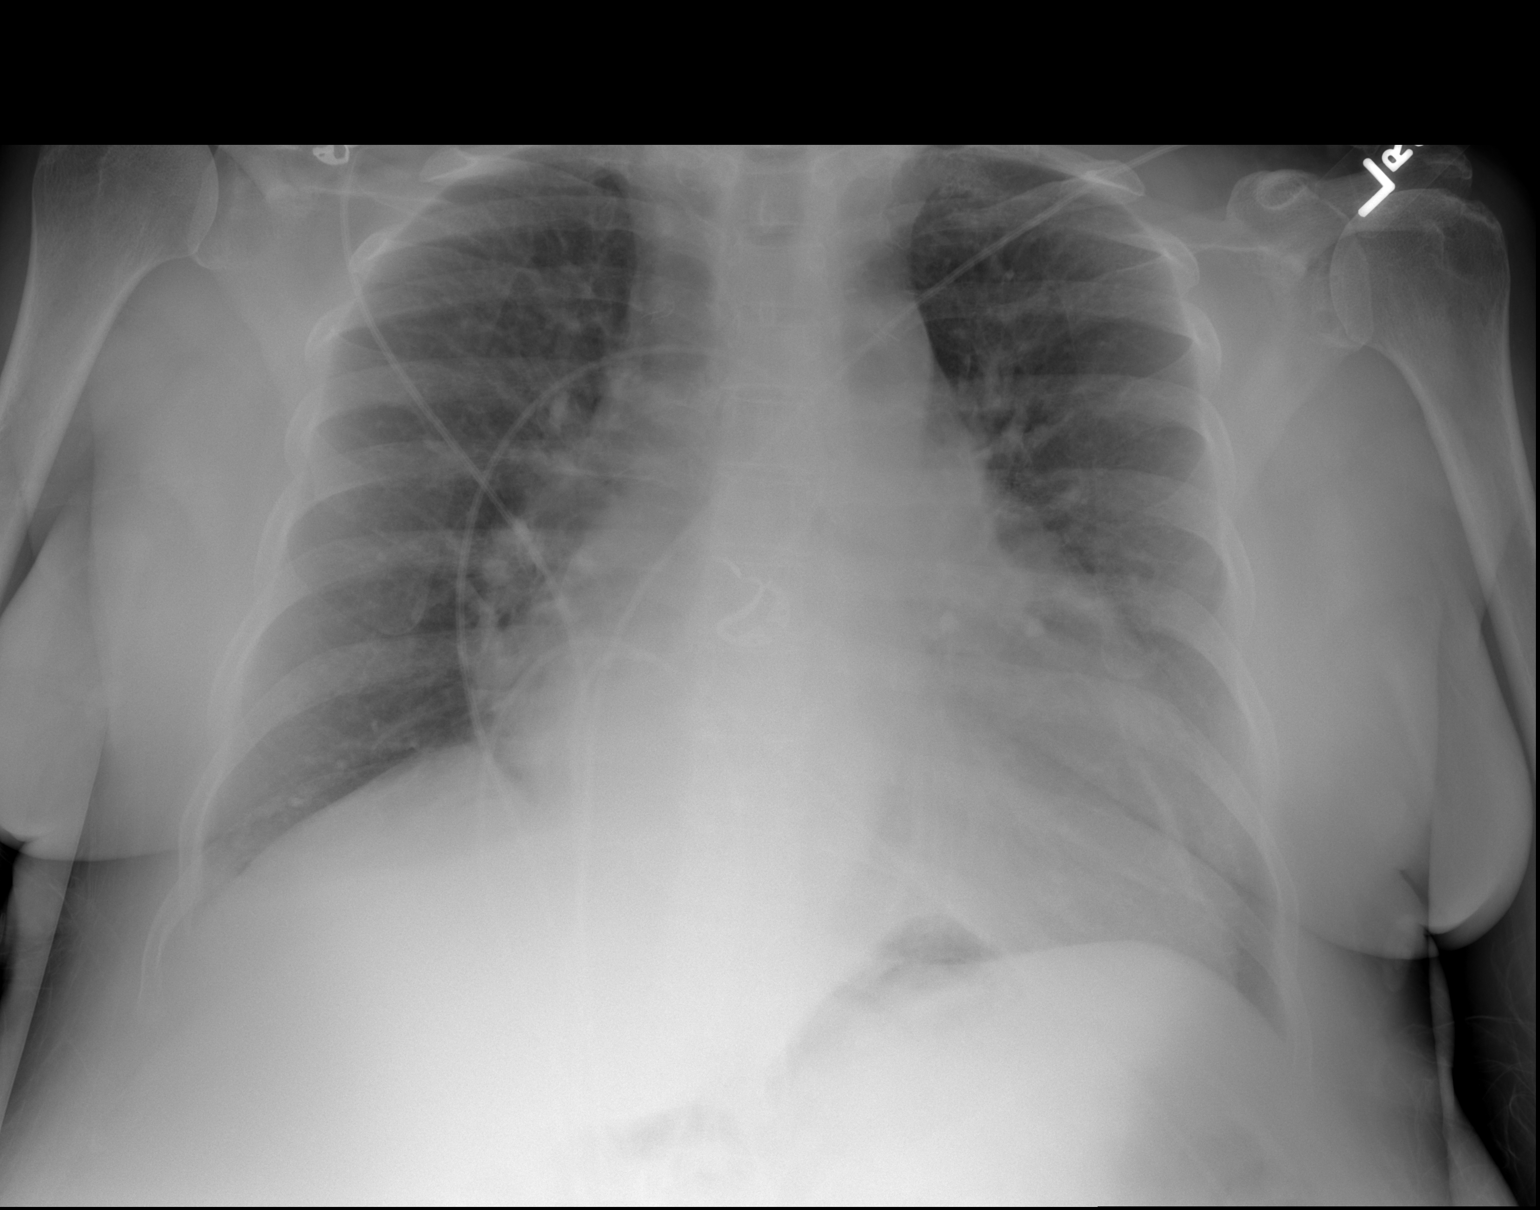

[w chest lat]
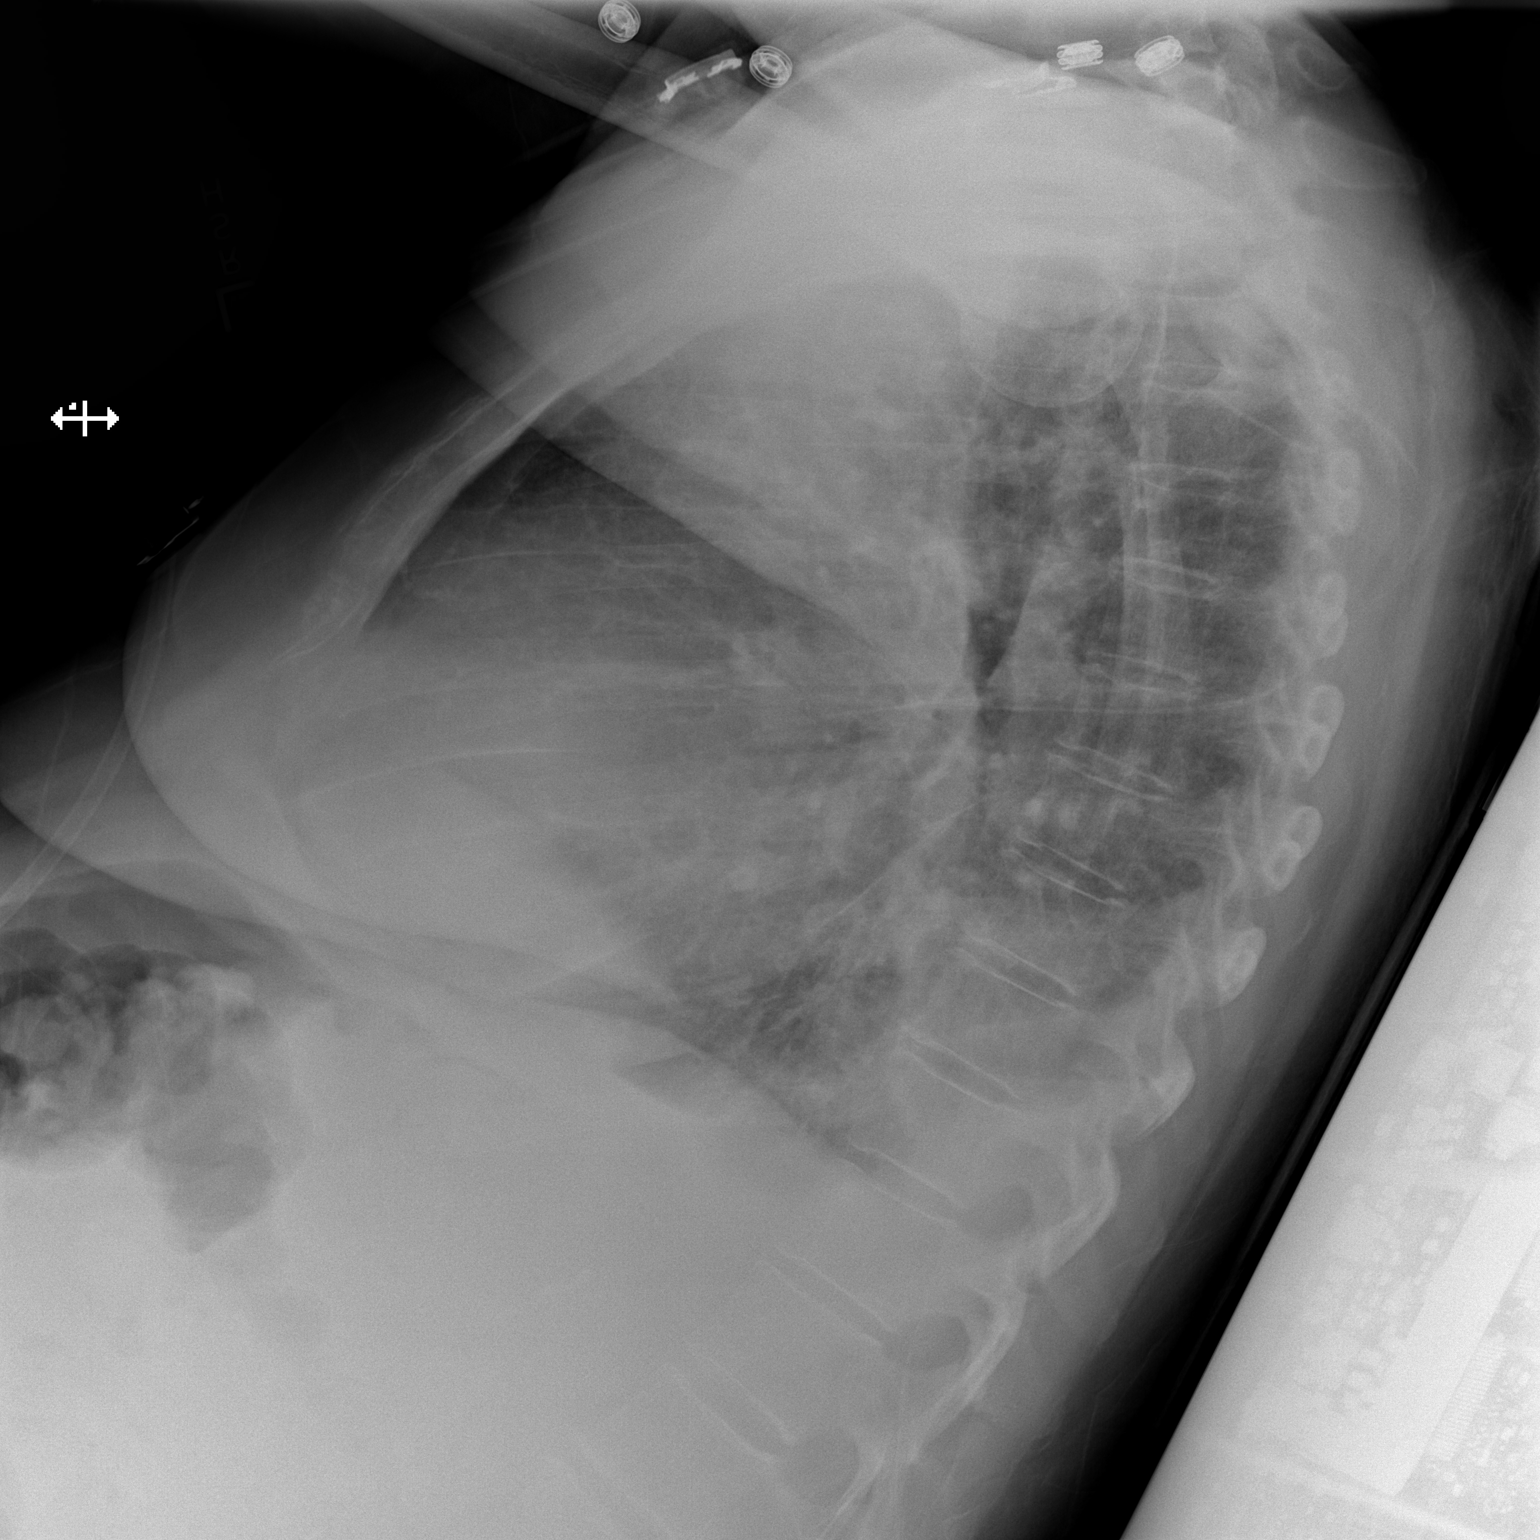

[2 of 2 positions shown; findings below may reference images not displayed]

FINDINGS: Lordotic positioning.

Enlargement of cardiac silhouette with slight vascular congestion.

Mediastinal contours normal.

Opacity identified at the posterior lower lobes on the lateral view
question pneumonia versus atelectasis.

Remaining lungs clear.

No pleural effusion or pneumothorax.

Underlying hyperinflation.

Bones unremarkable.
IMPRESSION: Enlargement of cardiac silhouette with pulmonary vascular
congestion.

Retrocardiac LEFT lower lobe opacity which could represent pneumonia
or atelectasis.

## 2019-05-21 DIAGNOSIS — M47812 Spondylosis without myelopathy or radiculopathy, cervical region: Secondary | ICD-10-CM | POA: Diagnosis not present

## 2019-05-21 DIAGNOSIS — M542 Cervicalgia: Secondary | ICD-10-CM | POA: Diagnosis not present

## 2019-05-28 DIAGNOSIS — M5412 Radiculopathy, cervical region: Secondary | ICD-10-CM | POA: Insufficient documentation

## 2019-06-07 DIAGNOSIS — M47812 Spondylosis without myelopathy or radiculopathy, cervical region: Secondary | ICD-10-CM | POA: Insufficient documentation

## 2019-06-11 DIAGNOSIS — I509 Heart failure, unspecified: Secondary | ICD-10-CM | POA: Diagnosis not present

## 2019-06-11 DIAGNOSIS — F419 Anxiety disorder, unspecified: Secondary | ICD-10-CM | POA: Diagnosis not present

## 2019-06-11 DIAGNOSIS — E539 Vitamin B deficiency, unspecified: Secondary | ICD-10-CM | POA: Diagnosis not present

## 2019-06-11 DIAGNOSIS — K7460 Unspecified cirrhosis of liver: Secondary | ICD-10-CM | POA: Diagnosis not present

## 2019-06-11 DIAGNOSIS — R110 Nausea: Secondary | ICD-10-CM | POA: Diagnosis not present

## 2019-06-11 DIAGNOSIS — R188 Other ascites: Secondary | ICD-10-CM | POA: Diagnosis not present

## 2019-06-11 DIAGNOSIS — Z0189 Encounter for other specified special examinations: Secondary | ICD-10-CM | POA: Diagnosis not present

## 2019-06-11 DIAGNOSIS — E559 Vitamin D deficiency, unspecified: Secondary | ICD-10-CM | POA: Diagnosis not present

## 2019-06-11 DIAGNOSIS — K219 Gastro-esophageal reflux disease without esophagitis: Secondary | ICD-10-CM | POA: Diagnosis not present

## 2019-06-21 ENCOUNTER — Encounter: Payer: Self-pay | Admitting: Cardiovascular Disease

## 2019-06-21 ENCOUNTER — Telehealth: Payer: Self-pay | Admitting: Cardiovascular Disease

## 2019-06-21 ENCOUNTER — Telehealth: Payer: PPO | Admitting: Cardiovascular Disease

## 2019-06-21 ENCOUNTER — Telehealth (INDEPENDENT_AMBULATORY_CARE_PROVIDER_SITE_OTHER): Payer: PPO | Admitting: Cardiovascular Disease

## 2019-06-21 VITALS — Ht 65.0 in | Wt 142.0 lb

## 2019-06-21 DIAGNOSIS — I38 Endocarditis, valve unspecified: Secondary | ICD-10-CM | POA: Diagnosis not present

## 2019-06-21 DIAGNOSIS — I428 Other cardiomyopathies: Secondary | ICD-10-CM | POA: Diagnosis not present

## 2019-06-21 DIAGNOSIS — I10 Essential (primary) hypertension: Secondary | ICD-10-CM

## 2019-06-21 DIAGNOSIS — I447 Left bundle-branch block, unspecified: Secondary | ICD-10-CM

## 2019-06-21 DIAGNOSIS — Z9889 Other specified postprocedural states: Secondary | ICD-10-CM | POA: Diagnosis not present

## 2019-06-21 DIAGNOSIS — I050 Rheumatic mitral stenosis: Secondary | ICD-10-CM

## 2019-06-21 DIAGNOSIS — I5032 Chronic diastolic (congestive) heart failure: Secondary | ICD-10-CM | POA: Diagnosis not present

## 2019-06-21 NOTE — Progress Notes (Signed)
Virtual Visit via Telephone Note   This visit type was conducted due to national recommendations for restrictions regarding the COVID-19 Pandemic (e.g. social distancing) in an effort to limit this patient's exposure and mitigate transmission in our community.  Due to her co-morbid illnesses, this patient is at least at moderate risk for complications without adequate follow up.  This format is felt to be most appropriate for this patient at this time.  The patient did not have access to video technology/had technical difficulties with video requiring transitioning to audio format only (telephone).  All issues noted in this document were discussed and addressed.  No physical exam could be performed with this format.  Please refer to the patient's chart for her  consent to telehealth for [facility].   The patient was identified using 2 identifiers.  Date:  06/21/2019   ID:  Carol Richmond, DOB [birth year], MRN [id number]  Patient Location: Home Provider Location: Office  PCP:  Neale Burly, MD  Cardiologist:  No primary care provider on file.  Electrophysiologist:  None   Evaluation Performed:  Follow-Up Visit  Chief Complaint: Cardiomyopathy, mitral stenosis  History of Present Illness:    Carol Richmond is a 69 y.o. female with mitral stenosis.  Coronary angiography on 11/18/13 showed mild nonobstructive disease. She has an inappropriate sinus tachycardia, hypertension, and hyperlipidemia. She has had chest pains in the past with no relief with Ranexa.  She has a history of TIA and underwent right carotid endarterectomy   I have not evaluated her in 02/19/2018.  Her husband passed away in Dec 21, 2018. They had been married for 68 yrs.  She is tearful. She said she is lonely.  She told me she is getting a cervical spine injection for arthritis this week.  She denies chest pain, palpitations, shortness of breath, and leg swelling.    Past Medical History:    Diagnosis Date  . Asthma   . Diabetes mellitus without complication ([place])   . Diarrhea   . Diverticulosis of colon (without mention of hemorrhage)   . Functional ovarian cysts   . GERD (gastroesophageal reflux disease)   . Hemorrhoids, external, without mention of complication   . Hormone replacement therapy (postmenopausal)   . Hypertension   . IBS (irritable bowel syndrome)   . Migraine   . Right knee DJD   . Seizures ([place])   . Shortness of breath   . Sleep apnea    no cpap    study 2 yrs ago   Past Surgical History:  Procedure Laterality Date  . BIOPSY  09/23/2018   Procedure: BIOPSY;  Surgeon: Rogene Houston, MD;  [facility];  Service: Endoscopy;;  duodeneum and stomach  . CHOLECYSTECTOMY  1980  . ENDARTERECTOMY Right 07/01/2017   Procedure: ENDARTERECTOMY CAROTID RIGHT;  Surgeon: Serafina Mitchell, MD;  Location: [facility] OR;  Service: Vascular;  Laterality: Right;  . ESOPHAGOGASTRODUODENOSCOPY N/A 09/23/2018   Procedure: ESOPHAGOGASTRODUODENOSCOPY (EGD);  Surgeon: Rogene Houston, MD;  [facility]: [facility];  Service: Endoscopy;  Laterality: N/A;  2:45-moved to 1:45 per Lelon Frohlich  . LEFT HEART CATHETERIZATION WITH CORONARY ANGIOGRAM N/A 11/18/2013   Procedure: LEFT HEART CATHETERIZATION WITH CORONARY ANGIOGRAM;  Surgeon: Burnell Blanks, MD;  Location: [facility] CATH LAB;  Service: Cardiovascular;  Laterality: N/A;  . OOPHORECTOMY     2012 Dr. Adah Perl in [place]  . PATCH ANGIOPLASTY Right 07/01/2017   Procedure: RIGHT CAROTID PATCH ANGIOPLASTY;  Surgeon: Harold Barban  W, MD;  Location: [place];  Service: Vascular;  Laterality: Right;  . TONSILLECTOMY    . TOTAL KNEE ARTHROPLASTY Right 09/02/2012   Procedure: TOTAL KNEE ARTHROPLASTY- right;  Surgeon: Ninetta Lights, MD;  Location: [place];  Service: Orthopedics;  Laterality: Right;  . TUBAL LIGATION    . TYMPANOMASTOIDECTOMY Left 04/02/2016   Procedure: LEFT CANAL WALL DOWN TYMPANOMASTOIDECTOMY;  Surgeon: Leta Baptist, MD;  Location:  [place];  Service: ENT;  Laterality: Left;  [name] VAGINAL HYSTERECTOMY  1990  . WOUND EXPLORATION Right 07/02/2017   Procedure: RIGHT NECK EXPLORATION WITH INTRAOPERATIVE ULTRASOUND;  Surgeon: Angelia Mould, MD;  Location: [facility] OR;  Service: Vascular;  Laterality: Right;     Current Meds  Medication Sig  . Acetaminophen (TYLENOL ARTHRITIS EXT RELIEF PO) Take 650 mg by mouth daily.  [name] albuterol (VENTOLIN HFA) 108 (90 Base) MCG/ACT inhaler Inhale 2 puffs into the lungs every 4 (four) hours as needed for shortness of breath.  [name] aspirin EC 81 MG tablet Take 1 tablet (81 mg total) by mouth daily.  . carvedilol (COREG) 3.125 MG tablet Take 3.125 mg by mouth 2 (two) times daily with a meal.  . cholecalciferol (VITAMIN D) 1000 UNITS tablet Take 1,000 Units by mouth daily.  . furosemide (LASIX) 40 MG tablet Take 40 mg by mouth 2 (two) times daily.   [name] lactulose (CHRONULAC) 10 GM/15ML solution Take 20 g by mouth 4 (four) times daily.   [name] lamoTRIgine (LAMICTAL) 200 MG tablet Take 200 mg by mouth 2 (two) times daily.   [name] LORazepam (ATIVAN) 0.5 MG tablet Take 0.5 mg by mouth 2 (two) times daily as needed.  . ondansetron (ZOFRAN) 4 MG tablet Take 4 mg by mouth every 8 (eight) hours as needed for nausea or vomiting.  . pantoprazole (PROTONIX) 40 MG tablet Take 1 tablet (40 mg total) by mouth 2 (two) times daily before a meal.  . Pediatric Multivitamins-Iron (FLINTSTONES PLUS IRON) chewable tablet Chew 1 tablet by mouth 2 (two) times daily.  . potassium chloride SA (K-DUR,KLOR-CON) 20 MEQ tablet Take 20 mEq by mouth daily.   . sertraline (ZOLOFT) 25 MG tablet Take 25 mg by mouth daily.  . [DISCONTINUED] mirtazapine (REMERON) 7.5 MG tablet Take 7.5 mg by mouth at bedtime.  . [DISCONTINUED] Multiple Vitamin (MULTIVITAMIN) tablet Take 1 tablet by mouth daily.   Current Facility-Administered Medications for the 06/21/19 encounter (Telemedicine) with Herminio Commons, MD  Medication  .  magic mouthwash     Allergies:   Patient has no known allergies.   Social History   Tobacco Use  . Smoking status: Never Smoker  . Smokeless tobacco: Never Used  Substance Use Topics  . Alcohol use: No    Alcohol/week: 0.0 standard drinks  . Drug use: No     Family Hx: The patient's family history includes Cancer in her sister; Diabetes in her mother; Heart disease in her brother, brother, brother, mother, and son.  ROS:   Please see the history of present illness.     All other systems reviewed and are negative.   Prior CV studies:   The following studies were reviewed today:  Echocardiogram 03/04/18:  - Left ventricle: The cavity size was normal. Wall thickness was  normal. Systolic function was normal. The estimated ejection  fraction was in the range of 55% to 60%. Features are consistent  with a pseudonormal left ventricular filling pattern, with  concomitant abnormal relaxation and increased filling pressure  (  grade 2 diastolic dysfunction). Doppler parameters are  consistent with high ventricular filling pressure.  - Aortic valve: Mildly calcified annulus. Trileaflet; mildly  thickened leaflets. There was mild regurgitation. Valve area  (VTI): 2.09 cm^2. Valve area (Vmax): 1.96 cm^2. Valve area  (Vmean): 2.07 cm^2.  - Mitral valve: Moderately calcified annulus. Moderately thickened  leaflets . The findings are consistent with moderate stenosis.  Anterior leaflet has a &quot;hockey stick&quot; formation suggestive of  rheumatic valve disease. There was mild to moderate  regurgitation. Mean gradient (D): 6 mm Hg. Valve area by pressure  half-time: 1.2 cm^2.  - Left atrium: The atrium was severely dilated.   Labs/Other Tests and Data Reviewed:    EKG:  No ECG reviewed.  Recent Labs: 08/05/2018: Hemoglobin 11.0; Platelets 205   Recent Lipid Panel Lab Results  Component Value Date/Time   CHOL  05/08/2007 05:30 AM    196          ATP III CLASSIFICATION:  <200     mg/dL   Desirable  200-239  mg/dL   Borderline High  >=240    mg/dL   High   TRIG 192 (H) 05/08/2007 05:30 AM   HDL 38 (L) 05/08/2007 05:30 AM   CHOLHDL 5.2 05/08/2007 05:30 AM   LDLCALC (H) 05/08/2007 05:30 AM    120        Total Cholesterol/HDL:CHD Risk Coronary Heart Disease Risk Table                     Men   Women  1/2 Average Risk   3.4   3.3    Wt Readings from Last 3 Encounters:  06/21/19 142 lb (64.4 kg)  03/08/19 143 lb 3.2 oz (65 kg)  01/25/19 138 lb (62.6 kg)     Objective:    Vital Signs:  Ht 5' 5"  (1.651 m)   Wt 142 lb (64.4 kg)   BMI 23.63 kg/m    VITAL SIGNS:  reviewed  ASSESSMENT & PLAN:    1.  Mitral stenosis: Moderate mitral stenosis with mild to moderate regurgitation noted by echocardiogram in 03/04/18.  I will obtain a follow-up echocardiogram.  2.  Chronic diastolic heart failure: Grade 2 diastolic dysfunction noted by echocardiogram in 03/04/18.  Continue Lasix 40 mg twice daily with supplements potassium.  Euvolemic.  I will obtain a follow-up echocardiogram.  3.  Hypertension: No change to therapy. She does not own a BP cuff.  4.  Right internal carotid artery stenosis: Status post carotid endarterectomy.  Currently on aspirin.  Not on statin.  5.  Left bundle branch block: Chronic.    COVID-19 Education: The signs and symptoms of COVID-19 were discussed with the patient and how to seek care for testing (follow up with PCP or arrange E-visit).  The importance of social distancing was discussed today.  Time:   Today, I have spent 25 minutes with the patient with telehealth technology discussing the above problems.     Medication Adjustments/Labs and Tests Ordered: Current medicines are reviewed at length with the patient today.  Concerns regarding medicines are outlined above.   Tests Ordered: No orders of the defined types were placed in this encounter.   Medication Changes: No orders of the  defined types were placed in this encounter.   Follow Up:  In Person in 6 month(s) with APP  Signed, Kate Sable, MD  06/21/2019 10:25 AM    Carol Richmond

## 2019-06-21 NOTE — Telephone Encounter (Signed)
  Precert needed for: Echo   Location: [facility]    Date: Jun 29, 2019

## 2019-06-21 NOTE — Patient Instructions (Addendum)
Medication Instructions: Your physician recommends that you continue on your current medications as directed. Please refer to the Current Medication list given to you today.   Labwork: None today   Procedures/Testing: Your physician has requested that you have an echocardiogram. Echocardiography is a painless test that uses sound waves to create images of your heart. It provides your doctor with information about the size and shape of your heart and how well your heart's chambers and valves are working. This procedure takes approximately one hour. There are no restrictions for this procedure.    Follow-Up: 6 months office visit with Carol Dung, NP in the [facility] office  Any Additional Special Instructions Will Be Listed Below (If Applicable).     If you need a refill on your cardiac medications before your next appointment, please call your pharmacy.      Thank you for choosing [facility] !

## 2019-06-21 NOTE — Addendum Note (Signed)
Addended by: Barbarann Ehlers A on: 06/21/2019 10:47 AM   Modules accepted: Orders

## 2019-06-22 DIAGNOSIS — M47812 Spondylosis without myelopathy or radiculopathy, cervical region: Secondary | ICD-10-CM | POA: Diagnosis not present

## 2019-06-23 ENCOUNTER — Other Ambulatory Visit (INDEPENDENT_AMBULATORY_CARE_PROVIDER_SITE_OTHER): Payer: Self-pay | Admitting: Internal Medicine

## 2019-06-28 DIAGNOSIS — E559 Vitamin D deficiency, unspecified: Secondary | ICD-10-CM | POA: Diagnosis not present

## 2019-06-28 DIAGNOSIS — R7301 Impaired fasting glucose: Secondary | ICD-10-CM | POA: Diagnosis not present

## 2019-06-28 DIAGNOSIS — D519 Vitamin B12 deficiency anemia, unspecified: Secondary | ICD-10-CM | POA: Diagnosis not present

## 2019-06-28 DIAGNOSIS — I10 Essential (primary) hypertension: Secondary | ICD-10-CM | POA: Diagnosis not present

## 2019-06-29 ENCOUNTER — Telehealth: Payer: Self-pay | Admitting: *Deleted

## 2019-06-29 ENCOUNTER — Other Ambulatory Visit: Payer: Self-pay

## 2019-06-29 ENCOUNTER — Ambulatory Visit (INDEPENDENT_AMBULATORY_CARE_PROVIDER_SITE_OTHER): Payer: PPO

## 2019-06-29 DIAGNOSIS — I050 Rheumatic mitral stenosis: Secondary | ICD-10-CM

## 2019-06-29 NOTE — Telephone Encounter (Signed)
-----   Message from Herminio Commons, MD sent at 06/29/2019 10:50 AM EDT ----- Normal pumping function with moderate mitral valve leakage and mild mitral valve narrowing.  I will continue to monitor.

## 2019-06-29 NOTE — Telephone Encounter (Signed)
Carol Richmond, [place]  1/75/1025 8:52 PM EDT    Patient notified. Copy to pcp.

## 2019-06-29 NOTE — Telephone Encounter (Signed)
Laurine Blazer, [place]  2/97/9892 1:19 PM EDT    No answer.

## 2019-06-29 NOTE — Telephone Encounter (Signed)
Patient returned call to [facility]. She requested a callback on cell # [phone].

## 2019-07-01 DIAGNOSIS — N1831 Chronic kidney disease, stage 3a: Secondary | ICD-10-CM | POA: Diagnosis not present

## 2019-07-01 DIAGNOSIS — E785 Hyperlipidemia, unspecified: Secondary | ICD-10-CM | POA: Diagnosis not present

## 2019-07-01 DIAGNOSIS — K219 Gastro-esophageal reflux disease without esophagitis: Secondary | ICD-10-CM | POA: Diagnosis not present

## 2019-07-01 DIAGNOSIS — F419 Anxiety disorder, unspecified: Secondary | ICD-10-CM | POA: Diagnosis not present

## 2019-07-01 DIAGNOSIS — G894 Chronic pain syndrome: Secondary | ICD-10-CM | POA: Diagnosis not present

## 2019-07-01 DIAGNOSIS — E539 Vitamin B deficiency, unspecified: Secondary | ICD-10-CM | POA: Diagnosis not present

## 2019-07-01 DIAGNOSIS — R188 Other ascites: Secondary | ICD-10-CM | POA: Diagnosis not present

## 2019-07-01 DIAGNOSIS — R110 Nausea: Secondary | ICD-10-CM | POA: Diagnosis not present

## 2019-07-01 DIAGNOSIS — J4540 Moderate persistent asthma, uncomplicated: Secondary | ICD-10-CM | POA: Diagnosis not present

## 2019-07-01 DIAGNOSIS — I509 Heart failure, unspecified: Secondary | ICD-10-CM | POA: Diagnosis not present

## 2019-07-01 DIAGNOSIS — K7460 Unspecified cirrhosis of liver: Secondary | ICD-10-CM | POA: Diagnosis not present

## 2019-07-01 DIAGNOSIS — E559 Vitamin D deficiency, unspecified: Secondary | ICD-10-CM | POA: Diagnosis not present

## 2019-07-08 DIAGNOSIS — M47812 Spondylosis without myelopathy or radiculopathy, cervical region: Secondary | ICD-10-CM | POA: Diagnosis not present

## 2019-07-15 DIAGNOSIS — M542 Cervicalgia: Secondary | ICD-10-CM | POA: Diagnosis not present

## 2019-07-15 DIAGNOSIS — F419 Anxiety disorder, unspecified: Secondary | ICD-10-CM | POA: Diagnosis not present

## 2019-07-20 DIAGNOSIS — S134XXA Sprain of ligaments of cervical spine, initial encounter: Secondary | ICD-10-CM | POA: Diagnosis not present

## 2019-07-20 DIAGNOSIS — M9903 Segmental and somatic dysfunction of lumbar region: Secondary | ICD-10-CM | POA: Diagnosis not present

## 2019-07-20 DIAGNOSIS — S233XXA Sprain of ligaments of thoracic spine, initial encounter: Secondary | ICD-10-CM | POA: Diagnosis not present

## 2019-07-20 DIAGNOSIS — M9902 Segmental and somatic dysfunction of thoracic region: Secondary | ICD-10-CM | POA: Diagnosis not present

## 2019-07-20 DIAGNOSIS — M9901 Segmental and somatic dysfunction of cervical region: Secondary | ICD-10-CM | POA: Diagnosis not present

## 2019-07-20 DIAGNOSIS — S338XXA Sprain of other parts of lumbar spine and pelvis, initial encounter: Secondary | ICD-10-CM | POA: Diagnosis not present

## 2019-07-22 DIAGNOSIS — S338XXA Sprain of other parts of lumbar spine and pelvis, initial encounter: Secondary | ICD-10-CM | POA: Diagnosis not present

## 2019-07-22 DIAGNOSIS — M9903 Segmental and somatic dysfunction of lumbar region: Secondary | ICD-10-CM | POA: Diagnosis not present

## 2019-07-22 DIAGNOSIS — M9901 Segmental and somatic dysfunction of cervical region: Secondary | ICD-10-CM | POA: Diagnosis not present

## 2019-07-22 DIAGNOSIS — S233XXA Sprain of ligaments of thoracic spine, initial encounter: Secondary | ICD-10-CM | POA: Diagnosis not present

## 2019-07-22 DIAGNOSIS — S134XXA Sprain of ligaments of cervical spine, initial encounter: Secondary | ICD-10-CM | POA: Diagnosis not present

## 2019-07-22 DIAGNOSIS — M9902 Segmental and somatic dysfunction of thoracic region: Secondary | ICD-10-CM | POA: Diagnosis not present

## 2019-07-28 DIAGNOSIS — H9202 Otalgia, left ear: Secondary | ICD-10-CM | POA: Diagnosis not present

## 2019-07-28 DIAGNOSIS — J029 Acute pharyngitis, unspecified: Secondary | ICD-10-CM | POA: Diagnosis not present

## 2019-07-29 DIAGNOSIS — M9903 Segmental and somatic dysfunction of lumbar region: Secondary | ICD-10-CM | POA: Diagnosis not present

## 2019-07-29 DIAGNOSIS — M9901 Segmental and somatic dysfunction of cervical region: Secondary | ICD-10-CM | POA: Diagnosis not present

## 2019-07-29 DIAGNOSIS — S134XXA Sprain of ligaments of cervical spine, initial encounter: Secondary | ICD-10-CM | POA: Diagnosis not present

## 2019-07-29 DIAGNOSIS — S233XXA Sprain of ligaments of thoracic spine, initial encounter: Secondary | ICD-10-CM | POA: Diagnosis not present

## 2019-07-29 DIAGNOSIS — M9902 Segmental and somatic dysfunction of thoracic region: Secondary | ICD-10-CM | POA: Diagnosis not present

## 2019-07-29 DIAGNOSIS — S338XXA Sprain of other parts of lumbar spine and pelvis, initial encounter: Secondary | ICD-10-CM | POA: Diagnosis not present

## 2019-08-04 DIAGNOSIS — R490 Dysphonia: Secondary | ICD-10-CM | POA: Diagnosis not present

## 2019-08-04 DIAGNOSIS — H95122 Granulation of postmastoidectomy cavity, left ear: Secondary | ICD-10-CM | POA: Diagnosis not present

## 2019-08-04 DIAGNOSIS — R070 Pain in throat: Secondary | ICD-10-CM | POA: Diagnosis not present

## 2019-08-05 DIAGNOSIS — M9903 Segmental and somatic dysfunction of lumbar region: Secondary | ICD-10-CM | POA: Diagnosis not present

## 2019-08-05 DIAGNOSIS — M9902 Segmental and somatic dysfunction of thoracic region: Secondary | ICD-10-CM | POA: Diagnosis not present

## 2019-08-05 DIAGNOSIS — S338XXA Sprain of other parts of lumbar spine and pelvis, initial encounter: Secondary | ICD-10-CM | POA: Diagnosis not present

## 2019-08-05 DIAGNOSIS — S233XXA Sprain of ligaments of thoracic spine, initial encounter: Secondary | ICD-10-CM | POA: Diagnosis not present

## 2019-08-05 DIAGNOSIS — M9901 Segmental and somatic dysfunction of cervical region: Secondary | ICD-10-CM | POA: Diagnosis not present

## 2019-08-05 DIAGNOSIS — S134XXA Sprain of ligaments of cervical spine, initial encounter: Secondary | ICD-10-CM | POA: Diagnosis not present

## 2019-08-10 DIAGNOSIS — M9902 Segmental and somatic dysfunction of thoracic region: Secondary | ICD-10-CM | POA: Diagnosis not present

## 2019-08-10 DIAGNOSIS — S134XXA Sprain of ligaments of cervical spine, initial encounter: Secondary | ICD-10-CM | POA: Diagnosis not present

## 2019-08-10 DIAGNOSIS — M9903 Segmental and somatic dysfunction of lumbar region: Secondary | ICD-10-CM | POA: Diagnosis not present

## 2019-08-10 DIAGNOSIS — M9901 Segmental and somatic dysfunction of cervical region: Secondary | ICD-10-CM | POA: Diagnosis not present

## 2019-08-10 DIAGNOSIS — S338XXA Sprain of other parts of lumbar spine and pelvis, initial encounter: Secondary | ICD-10-CM | POA: Diagnosis not present

## 2019-08-10 DIAGNOSIS — S233XXA Sprain of ligaments of thoracic spine, initial encounter: Secondary | ICD-10-CM | POA: Diagnosis not present

## 2019-08-16 DIAGNOSIS — S134XXA Sprain of ligaments of cervical spine, initial encounter: Secondary | ICD-10-CM | POA: Diagnosis not present

## 2019-08-16 DIAGNOSIS — S233XXA Sprain of ligaments of thoracic spine, initial encounter: Secondary | ICD-10-CM | POA: Diagnosis not present

## 2019-08-16 DIAGNOSIS — S338XXA Sprain of other parts of lumbar spine and pelvis, initial encounter: Secondary | ICD-10-CM | POA: Diagnosis not present

## 2019-08-16 DIAGNOSIS — M9901 Segmental and somatic dysfunction of cervical region: Secondary | ICD-10-CM | POA: Diagnosis not present

## 2019-08-16 DIAGNOSIS — M9903 Segmental and somatic dysfunction of lumbar region: Secondary | ICD-10-CM | POA: Diagnosis not present

## 2019-08-16 DIAGNOSIS — M9902 Segmental and somatic dysfunction of thoracic region: Secondary | ICD-10-CM | POA: Diagnosis not present

## 2019-08-19 DIAGNOSIS — M47812 Spondylosis without myelopathy or radiculopathy, cervical region: Secondary | ICD-10-CM | POA: Diagnosis not present

## 2019-08-23 DIAGNOSIS — F419 Anxiety disorder, unspecified: Secondary | ICD-10-CM | POA: Diagnosis not present

## 2019-08-23 DIAGNOSIS — H9202 Otalgia, left ear: Secondary | ICD-10-CM | POA: Diagnosis not present

## 2019-08-23 DIAGNOSIS — M542 Cervicalgia: Secondary | ICD-10-CM | POA: Diagnosis not present

## 2019-08-23 DIAGNOSIS — F339 Major depressive disorder, recurrent, unspecified: Secondary | ICD-10-CM | POA: Diagnosis not present

## 2019-08-23 DIAGNOSIS — R519 Headache, unspecified: Secondary | ICD-10-CM | POA: Diagnosis not present

## 2019-08-23 DIAGNOSIS — B370 Candidal stomatitis: Secondary | ICD-10-CM | POA: Diagnosis not present

## 2019-08-23 DIAGNOSIS — J029 Acute pharyngitis, unspecified: Secondary | ICD-10-CM | POA: Diagnosis not present

## 2019-09-01 DIAGNOSIS — R519 Headache, unspecified: Secondary | ICD-10-CM | POA: Diagnosis not present

## 2019-09-01 DIAGNOSIS — B370 Candidal stomatitis: Secondary | ICD-10-CM | POA: Diagnosis not present

## 2019-09-01 DIAGNOSIS — F419 Anxiety disorder, unspecified: Secondary | ICD-10-CM | POA: Diagnosis not present

## 2019-09-01 DIAGNOSIS — M542 Cervicalgia: Secondary | ICD-10-CM | POA: Diagnosis not present

## 2019-09-01 DIAGNOSIS — F339 Major depressive disorder, recurrent, unspecified: Secondary | ICD-10-CM | POA: Diagnosis not present

## 2019-09-10 DIAGNOSIS — M47812 Spondylosis without myelopathy or radiculopathy, cervical region: Secondary | ICD-10-CM | POA: Diagnosis not present

## 2019-09-10 DIAGNOSIS — M5030 Other cervical disc degeneration, unspecified cervical region: Secondary | ICD-10-CM | POA: Diagnosis not present

## 2019-09-10 DIAGNOSIS — F419 Anxiety disorder, unspecified: Secondary | ICD-10-CM | POA: Diagnosis not present

## 2019-09-21 ENCOUNTER — Other Ambulatory Visit: Payer: Self-pay

## 2019-09-21 ENCOUNTER — Ambulatory Visit (INDEPENDENT_AMBULATORY_CARE_PROVIDER_SITE_OTHER): Payer: PPO | Admitting: Internal Medicine

## 2019-09-21 ENCOUNTER — Encounter (INDEPENDENT_AMBULATORY_CARE_PROVIDER_SITE_OTHER): Payer: Self-pay | Admitting: Internal Medicine

## 2019-09-21 VITALS — BP 127/75 | HR 66 | Temp 98.4°F | Ht 64.0 in | Wt 151.0 lb

## 2019-09-21 DIAGNOSIS — K279 Peptic ulcer, site unspecified, unspecified as acute or chronic, without hemorrhage or perforation: Secondary | ICD-10-CM

## 2019-09-21 DIAGNOSIS — K219 Gastro-esophageal reflux disease without esophagitis: Secondary | ICD-10-CM | POA: Diagnosis not present

## 2019-09-21 DIAGNOSIS — K760 Fatty (change of) liver, not elsewhere classified: Secondary | ICD-10-CM | POA: Diagnosis not present

## 2019-09-21 MED ORDER — PANTOPRAZOLE SODIUM 40 MG PO TBEC: 40.0000 mg | DELAYED_RELEASE_TABLET | Freq: Every day | ORAL | 3 refills | Status: DC

## 2019-09-21 NOTE — Progress Notes (Signed)
Presenting complaint;  Follow-up for GERD and peptic ulcer disease. Patient says she was told she has cirrhosis and hepatic encephalopathy.  Database and subjective:  Patient 69 year old Caucasian female who is here for scheduled visit.  Her last visit was televisit in December 2020.  She has chronic GERD and history of gastric ulcers with upper GI bleed.  Patient says she was transferred to [facility] from [facility] 2019 when she had mental status changes or coma.  She was diagnosed with cirrhosis and discharged on lactulose.  She has not had any episodes of confusion or somnolence since then. She says she feels well.  She rarely has heartburn she denies dysphagia nausea vomiting or abdominal pain.  She states she has very good appetite.  She has gained 8 pounds since her last visit.  She has couple of bowel movements with lactulose. She has chronic neck pain.  She has had 2 shots with some relief.  She wonders if she can take Tylenol.  She has been told not to take Tylenol. She has received both doses of Covid vaccine.  Current Medications: Outpatient Encounter Medications as of 09/21/2019  Medication Sig   albuterol (VENTOLIN HFA) 108 (90 Base) MCG/ACT inhaler Inhale 2 puffs into the lungs every 4 (four) hours as needed for shortness of breath.   aspirin EC 81 MG tablet Take 1 tablet (81 mg total) by mouth daily.   carvedilol (COREG) 3.125 MG tablet Take 3.125 mg by mouth 2 (two) times daily with a meal.   cholecalciferol (VITAMIN D) 1000 UNITS tablet Take 1,000 Units by mouth daily.   diazepam (VALIUM) 10 MG tablet Take 10 mg by mouth daily as needed.   furosemide (LASIX) 40 MG tablet Take 40 mg by mouth 2 (two) times daily.    lactulose (CHRONULAC) 10 GM/15ML solution Take 20 g by mouth 4 (four) times daily.    lamoTRIgine (LAMICTAL) 200 MG tablet Take 200 mg by mouth 2 (two) times daily.    LORazepam (ATIVAN) 0.5 MG tablet Take 0.5 mg by mouth 2 (two) times daily as needed.    ondansetron (ZOFRAN) 4 MG tablet Take 4 mg by mouth every 8 (eight) hours as needed for nausea or vomiting.   pantoprazole (PROTONIX) 40 MG tablet TAKE 1 TABLET 2 TIMES A DAY BEFORE A MEAL.   Pediatric Multivitamins-Iron (FLINTSTONES PLUS IRON) chewable tablet Chew 1 tablet by mouth 2 (two) times daily.   potassium chloride SA (K-DUR,KLOR-CON) 20 MEQ tablet Take 20 mEq by mouth daily.    Acetaminophen (TYLENOL ARTHRITIS EXT RELIEF PO) Take 650 mg by mouth daily. (Patient not taking: Reported on 09/21/2019)   sertraline (ZOLOFT) 25 MG tablet Take 25 mg by mouth daily. (Patient not taking: Reported on 09/21/2019)   Facility-Administered Encounter Medications as of 09/21/2019  Medication   magic mouthwash     Objective: Blood pressure 127/75, pulse 66, temperature 98.4 F (36.9 C), temperature source Oral, height _0  (1.626 m), weight 151 lb (68.5 kg). Patient is alert and in no acute distress. She does not have asterixis. Conjunctiva is pink. Sclera is nonicteric Oropharyngeal mucosa is normal. She has upper lower dentures. No neck masses or thyromegaly noted. Cardiac exam with regular rhythm normal S1 and S2. No murmur or gallop noted. Lungs are clear to auscultation. Abdomen abdomen is symmetrical.  She has horizontal scar in right upper quadrant.  No organomegaly or masses. No LE edema or clubbing noted.  Labs/studies Results:  CBC Latest Ref Rng & Units 08/05/2018 08/15/2017  07/03/2017  WBC 3.8 - 10.8 Thousand/uL 6.6 11.2(H) 16.5(H)  Hemoglobin 11.7 - 15.5 g/dL 11.0(L) 11.1(L) 7.8(L)  Hematocrit 35 - 45 % 35.8 36.1 25.8(L)  Platelets 140 - 400 Thousand/uL 205 217 208    CMP Latest Ref Rng & Units 08/15/2017 07/02/2017 07/01/2017  Glucose 70 - 99 mg/dL 129(H) 145(H) 152(H)  BUN 8 - 23 mg/dL 13 21(H) 18  Creatinine 0.44 - 1.00 mg/dL 0.87 0.93 0.95  Sodium 135 - 145 mmol/L 142 138 142  Potassium 3.5 - 5.1 mmol/L 3.2(L) 4.5 3.2(L)  Chloride 98 - 111 mmol/L 101 104 103  CO2 22 - 32  mmol/L _0 Calcium 8.9 - 10.3 mg/dL 8.6(L) 8.5(L) 9.0  Total Protein 6.5 - 8.1 g/dL 7.3 - 7.6  Total Bilirubin 0.3 - 1.2 mg/dL 1.0 - 0.6  Alkaline Phos 38 - 126 U/L 79 - 93  AST 15 - 41 U/L 35 - 29  ALT 0 - 44 U/L 13 - 12(L)    Hepatic Function Latest Ref Rng & Units 08/15/2017 07/01/2017 05/28/2017  Total Protein 6.5 - 8.1 g/dL 7.3 7.6 7.3  Albumin 3.5 - 5.0 g/dL 3.7 3.8 4.0  AST 15 - 41 U/L 35 29 29  ALT 0 - 44 U/L 13 12(L) 15  Alk Phosphatase 38 - 126 U/L 79 93 90  Total Bilirubin 0.3 - 1.2 mg/dL 1.0 0.6 0.5  Bilirubin, Direct <=0.2 mg/dL - - -    No results found for: CRP    Assessment:  #1.  Chronic GERD.  She is doing well with therapy.  I believe she is ready for dose reduction and PPI.  #2.  Fatty liver disease.  She has a history of elevated transaminases and fatty liver dating back to January 2013 when she had ultrasound.  In 2019 she was treated at [facility] and told that she has cirrhosis and hepatic encephalopathy.  She remains on lactulose and doing well.  She does not have any other stigmata of chronic liver disease such as thrombocytopenia or splenomegaly.  She also did not have esophageal or gastric varices on EGD 1 year ago.  Therefore need to look further.  #3.  History of peptic ulcer disease complicated by upper GI bleed.  She had multiple superficial ulcers on EGD 1 year ago.  Biopsy revealed benign etiology and negative for H. pylori.  She will continue PPI but dose reduced to once daily as noted above.   Plan:  Decrease pantoprazole to 40 mg by mouth 30 minutes before breakfast daily.  Notify if you experience heartburn or epigastric pain. She will go to the lab for CBC, comprehensive chemistry panel INR and serum ammonia. We will schedule patient for abdominal ultrasound with elastography. Will review records from [place] from 2019 admission. Follow-up in 6 months.

## 2019-09-21 NOTE — Patient Instructions (Signed)
Decrease pantoprazole to once daily.  Take it 30 minutes before breakfast every day.  Notify if you have epigastric pain or heartburn with lower dose. Physician will call with results of blood tests and ultrasound when completed.

## 2019-09-22 ENCOUNTER — Other Ambulatory Visit: Payer: Self-pay | Admitting: *Deleted

## 2019-09-22 DIAGNOSIS — I6523 Occlusion and stenosis of bilateral carotid arteries: Secondary | ICD-10-CM

## 2019-09-22 LAB — COMPREHENSIVE METABOLIC PANEL WITH GFR
AG Ratio: 1.7 (calc) (ref 1.0–2.5)
ALT: 17 U/L (ref 6–29)
AST: 32 U/L (ref 10–35)
Albumin: 4.5 g/dL (ref 3.6–5.1)
Alkaline phosphatase (APISO): 98 U/L (ref 37–153)
BUN/Creatinine Ratio: 11 (calc) (ref 6–22)
BUN: 11 mg/dL (ref 7–25)
CO2: 37 mmol/L — ABNORMAL HIGH (ref 20–32)
Calcium: 9.6 mg/dL (ref 8.6–10.4)
Chloride: 99 mmol/L (ref 98–110)
Creat: 1.03 mg/dL — ABNORMAL HIGH (ref 0.50–0.99)
Globulin: 2.6 g/dL (ref 1.9–3.7)
Glucose, Bld: 90 mg/dL (ref 65–139)
Potassium: 4.3 mmol/L (ref 3.5–5.3)
Sodium: 142 mmol/L (ref 135–146)
Total Bilirubin: 0.6 mg/dL (ref 0.2–1.2)
Total Protein: 7.1 g/dL (ref 6.1–8.1)

## 2019-09-22 LAB — CBC
HCT: 41 % (ref 35.0–45.0)
Hemoglobin: 13.8 g/dL (ref 11.7–15.5)
MCH: 29.4 pg (ref 27.0–33.0)
MCHC: 33.7 g/dL (ref 32.0–36.0)
MCV: 87.2 fL (ref 80.0–100.0)
MPV: 10.4 fL (ref 7.5–12.5)
Platelets: 156 Thousand/uL (ref 140–400)
RBC: 4.7 Million/uL (ref 3.80–5.10)
RDW: 13.1 % (ref 11.0–15.0)
WBC: 7.6 Thousand/uL (ref 3.8–10.8)

## 2019-09-22 LAB — AMMONIA: Ammonia: 32 umol/L (ref <=72)

## 2019-09-22 LAB — PROTIME-INR
INR: 1
Prothrombin Time: 11 s (ref 9.0–11.5)

## 2019-09-23 ENCOUNTER — Other Ambulatory Visit (INDEPENDENT_AMBULATORY_CARE_PROVIDER_SITE_OTHER): Payer: Self-pay | Admitting: *Deleted

## 2019-09-23 DIAGNOSIS — K279 Peptic ulcer, site unspecified, unspecified as acute or chronic, without hemorrhage or perforation: Secondary | ICD-10-CM

## 2019-09-23 DIAGNOSIS — D509 Iron deficiency anemia, unspecified: Secondary | ICD-10-CM

## 2019-09-23 DIAGNOSIS — K760 Fatty (change of) liver, not elsewhere classified: Secondary | ICD-10-CM

## 2019-09-23 DIAGNOSIS — K7460 Unspecified cirrhosis of liver: Secondary | ICD-10-CM

## 2019-09-24 ENCOUNTER — Telehealth (INDEPENDENT_AMBULATORY_CARE_PROVIDER_SITE_OTHER): Payer: Self-pay | Admitting: Internal Medicine

## 2019-09-24 NOTE — Telephone Encounter (Signed)
Patient left message stating Dr Laural Golden spoke with her daughter yesterday - would like to talk to him herself - ph# [phone]

## 2019-09-24 NOTE — Telephone Encounter (Signed)
Dr.Rehman tried to reach patient yesterday, her voicemail said they she would not accept his call. He did call her daughter and share that with her as well as her results and that she would need to have lab work done. He is off today , so it may be early next week before she may hear from him. She may want to make sure her phone is where she can except his call.  Please call patient.

## 2019-09-29 ENCOUNTER — Ambulatory Visit (HOSPITAL_COMMUNITY): Payer: PPO

## 2019-09-30 ENCOUNTER — Other Ambulatory Visit: Payer: Self-pay

## 2019-09-30 ENCOUNTER — Ambulatory Visit (HOSPITAL_COMMUNITY)
Admission: RE | Admit: 2019-09-30 | Discharge: 2019-09-30 | Disposition: A | Discharge status: Home / Self Care | Payer: PPO | Source: Ambulatory Visit | Attending: Internal Medicine | Admitting: Internal Medicine

## 2019-09-30 DIAGNOSIS — Z9049 Acquired absence of other specified parts of digestive tract: Secondary | ICD-10-CM | POA: Diagnosis not present

## 2019-09-30 DIAGNOSIS — K760 Fatty (change of) liver, not elsewhere classified: Secondary | ICD-10-CM | POA: Diagnosis not present

## 2019-10-05 NOTE — Telephone Encounter (Signed)
Was addressed last week. Please see note tagged with lab and ultrasound.

## 2019-10-06 ENCOUNTER — Ambulatory Visit: Payer: PPO | Admitting: Physician Assistant

## 2019-10-06 ENCOUNTER — Ambulatory Visit (HOSPITAL_COMMUNITY)
Admission: RE | Admit: 2019-10-06 | Discharge: 2019-10-06 | Disposition: A | Discharge status: Home / Self Care | Payer: PPO | Source: Ambulatory Visit | Attending: Vascular Surgery | Admitting: Vascular Surgery

## 2019-10-06 ENCOUNTER — Other Ambulatory Visit: Payer: Self-pay

## 2019-10-06 VITALS — BP 124/74 | HR 63 | Temp 97.3°F | Resp 20 | Ht 64.0 in | Wt 153.0 lb

## 2019-10-06 DIAGNOSIS — Z9889 Other specified postprocedural states: Secondary | ICD-10-CM

## 2019-10-06 DIAGNOSIS — I6523 Occlusion and stenosis of bilateral carotid arteries: Secondary | ICD-10-CM | POA: Diagnosis not present

## 2019-10-06 NOTE — Progress Notes (Signed)
Office Note     CC:  follow up Requesting Provider:  Neale Burly, MD  HPI: Carol Richmond is a 69 y.o. (08-Apr-1950) female who presents for routine surveillance following right carotid endarterectomy for symptomatic right carotid stenosis.  This was performed by Dr. Trula Slade on Jul 01, 2017.  The day before her surgery, she presented to the office having multiple daily TIAs.  Her CTA showed a high-grade right carotid stenosis and this was estimated at 99% at the time of surgery.  On postoperative day 1, she developed expressive aphasia and left lower extremity weakness.  She was returned to the operating room where she underwent exploration of her right carotid endarterectomy site and intraoperative duplex.  There was good flow in the internal carotid artery throughout with no areas of increased velocity.  She was placed on dual antiplatelet therapy.  She states she no longer takes Plavix.    She has no residual neurologic deficits.  She currently denies extremity weakness, slurred speech, facial drooping or monocular blindness.  She has been diagnosed with liver disease and is not on statins.  She is compliant with aspirin 81 mg daily even though bruising of her forearms is unsightly.  Not diabetic.  Has never used tobacco products.   Past Medical History:  Diagnosis Date  . Asthma   . Diabetes mellitus without complication ([place])   . Diarrhea   . Diverticulosis of colon (without mention of hemorrhage)   . Functional ovarian cysts   . GERD (gastroesophageal reflux disease)   . Hemorrhoids, external, without mention of complication   . Hormone replacement therapy (postmenopausal)   . Hypertension   . IBS (irritable bowel syndrome)   . Migraine   . Right knee DJD   . Seizures ([place])   . Shortness of breath   . Sleep apnea    no cpap    study 2 yrs ago    Past Surgical History:  Procedure Laterality Date  . BIOPSY  09/23/2018   Procedure: BIOPSY;  Surgeon: Rogene Houston,  MD;  [facility];  Service: Endoscopy;;  duodeneum and stomach  . CHOLECYSTECTOMY  1980  . ENDARTERECTOMY Right 07/01/2017   Procedure: ENDARTERECTOMY CAROTID RIGHT;  Surgeon: Serafina Mitchell, MD;  Location: [facility] OR;  Service: Vascular;  Laterality: Right;  . ESOPHAGOGASTRODUODENOSCOPY N/A 09/23/2018   Procedure: ESOPHAGOGASTRODUODENOSCOPY (EGD);  Surgeon: Rogene Houston, MD;  [facility];  Service: Endoscopy;  Laterality: N/A;  2:45-moved to 1:45 per Lelon Frohlich  . LEFT HEART CATHETERIZATION WITH CORONARY ANGIOGRAM N/A 11/18/2013   Procedure: LEFT HEART CATHETERIZATION WITH CORONARY ANGIOGRAM;  Surgeon: Burnell Blanks, MD;  Location: [facility] CATH LAB;  Service: Cardiovascular;  Laterality: N/A;  . OOPHORECTOMY     2012 Dr. Adah Perl in [place]  . PATCH ANGIOPLASTY Right 07/01/2017   Procedure: RIGHT CAROTID PATCH ANGIOPLASTY;  Surgeon: Serafina Mitchell, MD;  [facility] OR;  Service: Vascular;  Laterality: Right;  . TONSILLECTOMY    . TOTAL KNEE ARTHROPLASTY Right 09/02/2012   Procedure: TOTAL KNEE ARTHROPLASTY- right;  Surgeon: Ninetta Lights, MD;  Location: [place];  Service: Orthopedics;  Laterality: Right;  . TUBAL LIGATION    . TYMPANOMASTOIDECTOMY Left 04/02/2016   Procedure: LEFT CANAL WALL DOWN TYMPANOMASTOIDECTOMY;  Surgeon: Leta Baptist, MD;  Location: [place];  Service: ENT;  Laterality: Left;  Carol Richmond VAGINAL HYSTERECTOMY  1990  . WOUND EXPLORATION Right 07/02/2017   Procedure: RIGHT NECK EXPLORATION WITH INTRAOPERATIVE ULTRASOUND;  Surgeon: Angelia Mould, MD;  Location: [facility] OR;  Service: Vascular;  Laterality: Right;    Social History   Socioeconomic History  . Marital status: Widowed    Spouse name: Not on file  . Number of children: Not on file  . Years of education: Not on file  . Highest education level: Not on file  Occupational History  . Not on file  Tobacco Use  . Smoking status: Never Smoker  . Smokeless tobacco: Never Used  Vaping Use   . Vaping Use: Never used  Substance and Sexual Activity  . Alcohol use: No    Alcohol/week: 0.0 standard drinks  . Drug use: No  . Sexual activity: Not on file  Other Topics Concern  . Not on file  Social History Narrative  . Not on file   Social Determinants of Health   Financial Resource Strain:   . Difficulty of Paying Living Expenses:   Food Insecurity:   . Worried About Charity fundraiser in the Last Year:   . Arboriculturist in the Last Year:   Transportation Needs:   . Film/video editor (Medical):   Carol Richmond Lack of Transportation (Non-Medical):   Physical Activity:   . Days of Exercise per Week:   . Minutes of Exercise per Session:   Stress:   . Feeling of Stress :   Social Connections:   . Frequency of Communication with Friends and Family:   . Frequency of Social Gatherings with Friends and Family:   . Attends Religious Services:   . Active Member of Clubs or Organizations:   . Attends Archivist Meetings:   Carol Richmond Marital Status:   Intimate Partner Violence:   . Fear of Current or Ex-Partner:   . Emotionally Abused:   Carol Richmond Physically Abused:   . Sexually Abused:    Family History  Problem Relation Age of Onset  . Heart disease Mother   . Diabetes Mother   . Heart disease Brother   . Cancer Sister   . Heart disease Brother   . Heart disease Brother   . Heart disease Son     Current Outpatient Medications  Medication Sig Dispense Refill  . Acetaminophen (TYLENOL ARTHRITIS EXT RELIEF PO) Take 650 mg by mouth daily.     Carol Richmond albuterol (VENTOLIN HFA) 108 (90 Base) MCG/ACT inhaler Inhale 2 puffs into the lungs every 4 (four) hours as needed for shortness of breath.    Carol Richmond aspirin EC 81 MG tablet Take 1 tablet (81 mg total) by mouth daily.    . carvedilol (COREG) 3.125 MG tablet Take 3.125 mg by mouth 2 (two) times daily with a meal.    . cholecalciferol (VITAMIN D) 1000 UNITS tablet Take 1,000 Units by mouth daily.    . diazepam (VALIUM) 10 MG tablet Take  10 mg by mouth daily as needed.    . furosemide (LASIX) 40 MG tablet Take 40 mg by mouth 2 (two) times daily.     Carol Richmond lactulose (CHRONULAC) 10 GM/15ML solution Take 20 g by mouth 4 (four) times daily.     Carol Richmond lamoTRIgine (LAMICTAL) 200 MG tablet Take 200 mg by mouth 2 (two) times daily.     Carol Richmond levocetirizine (XYZAL) 5 MG tablet levocetirizine 5 mg tablet    . LORazepam (ATIVAN) 0.5 MG tablet Take 0.5 mg by mouth 2 (two) times daily as needed.    . Methylcobalamin (B12-ACTIVE PO) B12    . ondansetron (ZOFRAN) 4  MG tablet Take 4 mg by mouth every 8 (eight) hours as needed for nausea or vomiting.    . Oxycodone HCl 10 MG TABS Take 10 mg by mouth 3 (three) times daily as needed.    . pantoprazole (PROTONIX) 40 MG tablet Take 1 tablet (40 mg total) by mouth daily before breakfast. 90 tablet 3  . Pediatric Multivitamins-Iron (FLINTSTONES PLUS IRON) chewable tablet Chew 1 tablet by mouth 2 (two) times daily.    . potassium chloride SA (K-DUR,KLOR-CON) 20 MEQ tablet Take 20 mEq by mouth daily.      Current Facility-Administered Medications  Medication Dose Route Frequency Provider Last Rate Last Admin  . magic mouthwash  5 mL Oral TID PRN Laurine Blazer A, PA-C        No Known Allergies   REVIEW OF SYSTEMS:   [X]  denotes positive finding, [ ]  denotes negative finding Cardiac  Comments:  Chest pain or chest pressure:    Shortness of breath upon exertion:    Short of breath when lying flat:    Irregular heart rhythm:        Vascular    Pain in calf, thigh, or hip brought on by ambulation:    Pain in feet at night that wakes you up from your sleep:     Blood clot in your veins:    Leg swelling:         Pulmonary    Oxygen at home:    Productive cough:     Wheezing:         Neurologic    Sudden weakness in arms or legs:     Sudden numbness in arms or legs:     Sudden onset of difficulty speaking or slurred speech:    Temporary loss of vision in one eye:     Problems with dizziness:           Gastrointestinal    Blood in stool:     Vomited blood:         Genitourinary    Burning when urinating:     Blood in urine:        Psychiatric    Major depression:  x  death of husband earlier this year      Hematologic    Bleeding problems:    Problems with blood clotting too easily:        Skin    Rashes or ulcers:        Constitutional    Fever or chills:      PHYSICAL EXAMINATION:  Vitals:   10/06/19 1338 10/06/19 1341  BP: 128/73 124/74  Pulse: 63   Resp: 20   Temp: (!) 97.3 F (36.3 C)   TempSrc: Temporal   SpO2: 95%   Weight: 153 lb (69.4 kg)   Height: 5' 4"  (1.626 m)     General:  WDWN in NAD; vital signs documented above Gait: Unaided; no ataxia HENT: WNL, normocephalic Pulmonary: normal non-labored breathing , without Rales, rhonchi,  wheezing Cardiac: regular HR, without  Murmurs without carotid bruit Abdomen: soft, NT, no masses.   Skin: without rashes. Small ecchymotic areas of both forearms Vascular Exam/Pulses: 2+ brachial and radial pulses bilaterally Extremities: without ischemic changes, without Gangrene , without cellulitis; without open wounds;  Musculoskeletal: no muscle wasting or atrophy  Neurologic: A&O X 3;  No focal weakness or paresthesias are detected Psychiatric:  The pt has Normal affect.   Non-Invasive Vascular Imaging:   10/06/2019 Right Carotid:  Velocities in the right ICA are consistent with a 1-39%  stenosis.   Left Carotid: Velocities in the left ICA are consistent with a 1-39%  stenosis.   ASSESSMENT/PLAN:: 69 y.o. female here for follow up for carotid artery stenosis status post right carotid endarterectomy.  Minimal increase in estimated right carotid stenosis.  Patient is without symptoms.  I encouraged her to continue her aspirin therapy in spite of bruising.  We reviewed signs and symptoms of stroke/TIA.  Advised her to seek immediate medical attention should these occur.  Follow-up in 1 year with carotid  duplex  Barbie Banner, PA-C [facility] [phone]  Clinic MD:   [place]

## 2019-10-12 DIAGNOSIS — J4540 Moderate persistent asthma, uncomplicated: Secondary | ICD-10-CM | POA: Diagnosis not present

## 2019-10-12 DIAGNOSIS — F419 Anxiety disorder, unspecified: Secondary | ICD-10-CM | POA: Diagnosis not present

## 2019-10-12 DIAGNOSIS — E785 Hyperlipidemia, unspecified: Secondary | ICD-10-CM | POA: Diagnosis not present

## 2019-10-12 DIAGNOSIS — R188 Other ascites: Secondary | ICD-10-CM | POA: Diagnosis not present

## 2019-10-12 DIAGNOSIS — E559 Vitamin D deficiency, unspecified: Secondary | ICD-10-CM | POA: Diagnosis not present

## 2019-10-12 DIAGNOSIS — G894 Chronic pain syndrome: Secondary | ICD-10-CM | POA: Diagnosis not present

## 2019-10-12 DIAGNOSIS — K7460 Unspecified cirrhosis of liver: Secondary | ICD-10-CM | POA: Diagnosis not present

## 2019-10-12 DIAGNOSIS — E539 Vitamin B deficiency, unspecified: Secondary | ICD-10-CM | POA: Diagnosis not present

## 2019-10-12 DIAGNOSIS — R110 Nausea: Secondary | ICD-10-CM | POA: Diagnosis not present

## 2019-10-12 DIAGNOSIS — N1831 Chronic kidney disease, stage 3a: Secondary | ICD-10-CM | POA: Diagnosis not present

## 2019-10-12 DIAGNOSIS — I509 Heart failure, unspecified: Secondary | ICD-10-CM | POA: Diagnosis not present

## 2019-10-12 DIAGNOSIS — K219 Gastro-esophageal reflux disease without esophagitis: Secondary | ICD-10-CM | POA: Diagnosis not present

## 2019-10-18 DIAGNOSIS — R188 Other ascites: Secondary | ICD-10-CM | POA: Diagnosis not present

## 2019-10-18 DIAGNOSIS — N1831 Chronic kidney disease, stage 3a: Secondary | ICD-10-CM | POA: Diagnosis not present

## 2019-10-18 DIAGNOSIS — J4540 Moderate persistent asthma, uncomplicated: Secondary | ICD-10-CM | POA: Diagnosis not present

## 2019-10-18 DIAGNOSIS — E539 Vitamin B deficiency, unspecified: Secondary | ICD-10-CM | POA: Diagnosis not present

## 2019-10-18 DIAGNOSIS — K219 Gastro-esophageal reflux disease without esophagitis: Secondary | ICD-10-CM | POA: Diagnosis not present

## 2019-10-18 DIAGNOSIS — R110 Nausea: Secondary | ICD-10-CM | POA: Diagnosis not present

## 2019-10-18 DIAGNOSIS — E785 Hyperlipidemia, unspecified: Secondary | ICD-10-CM | POA: Diagnosis not present

## 2019-10-18 DIAGNOSIS — E559 Vitamin D deficiency, unspecified: Secondary | ICD-10-CM | POA: Diagnosis not present

## 2019-10-18 DIAGNOSIS — K7460 Unspecified cirrhosis of liver: Secondary | ICD-10-CM | POA: Diagnosis not present

## 2019-10-18 DIAGNOSIS — G894 Chronic pain syndrome: Secondary | ICD-10-CM | POA: Diagnosis not present

## 2019-10-18 DIAGNOSIS — I509 Heart failure, unspecified: Secondary | ICD-10-CM | POA: Diagnosis not present

## 2019-10-18 DIAGNOSIS — F419 Anxiety disorder, unspecified: Secondary | ICD-10-CM | POA: Diagnosis not present

## 2019-10-19 DIAGNOSIS — M5030 Other cervical disc degeneration, unspecified cervical region: Secondary | ICD-10-CM | POA: Diagnosis not present

## 2019-10-28 DIAGNOSIS — H348112 Central retinal vein occlusion, right eye, stable: Secondary | ICD-10-CM | POA: Diagnosis not present

## 2019-10-28 DIAGNOSIS — W57XXXA Bitten or stung by nonvenomous insect and other nonvenomous arthropods, initial encounter: Secondary | ICD-10-CM | POA: Diagnosis not present

## 2019-10-28 DIAGNOSIS — H9311 Tinnitus, right ear: Secondary | ICD-10-CM | POA: Diagnosis not present

## 2019-10-28 DIAGNOSIS — M542 Cervicalgia: Secondary | ICD-10-CM | POA: Diagnosis not present

## 2019-11-08 ENCOUNTER — Telehealth (INDEPENDENT_AMBULATORY_CARE_PROVIDER_SITE_OTHER): Payer: Self-pay | Admitting: Internal Medicine

## 2019-11-08 NOTE — Telephone Encounter (Signed)
I spoke to [place] and her daughter and explained that Dr Laural Golden has lowered the dose of Pantoprazole to 1 tablet daily before breakfast and 2 grams of tylenol is equivalent to 576m every 4 to 6 hours prn

## 2019-11-08 NOTE — Telephone Encounter (Signed)
Patient called stated Dr Laural Golden had changed her pantoprazole from 2 to 1 pill per day and also stated she could take 2 grams of tylenol (she doesn't know how much that would be)  - please advise - ph# [phone]

## 2019-11-09 NOTE — Telephone Encounter (Signed)
Agree - please let her know she can take up to (4) of the 560m Tylenol daily. Thanks.

## 2019-11-09 NOTE — Telephone Encounter (Signed)
Carol Richmond is aware of the recommendation

## 2019-11-22 DIAGNOSIS — M542 Cervicalgia: Secondary | ICD-10-CM | POA: Diagnosis not present

## 2019-11-22 DIAGNOSIS — F331 Major depressive disorder, recurrent, moderate: Secondary | ICD-10-CM | POA: Diagnosis not present

## 2019-11-22 DIAGNOSIS — G894 Chronic pain syndrome: Secondary | ICD-10-CM | POA: Diagnosis not present

## 2019-11-22 DIAGNOSIS — K219 Gastro-esophageal reflux disease without esophagitis: Secondary | ICD-10-CM | POA: Diagnosis not present

## 2019-11-22 DIAGNOSIS — R070 Pain in throat: Secondary | ICD-10-CM | POA: Diagnosis not present

## 2019-11-22 DIAGNOSIS — G4089 Other seizures: Secondary | ICD-10-CM | POA: Diagnosis not present

## 2019-12-09 DIAGNOSIS — R070 Pain in throat: Secondary | ICD-10-CM | POA: Diagnosis not present

## 2019-12-14 DIAGNOSIS — M542 Cervicalgia: Secondary | ICD-10-CM | POA: Diagnosis not present

## 2019-12-20 ENCOUNTER — Ambulatory Visit: Payer: PPO | Admitting: Cardiovascular Disease

## 2019-12-22 ENCOUNTER — Encounter: Payer: Self-pay | Admitting: Cardiology

## 2019-12-22 NOTE — Progress Notes (Signed)
Cardiology Office Note  Date: 12/23/2019   ID: Carol, Richmond [birth year], MRN [id number]  PCP:  Celene Squibb, MD  Cardiologist:  Rozann Lesches, MD Electrophysiologist:  None   Chief Complaint  Patient presents with  . Cardiac follow-up    History of Present Illness: Carol Richmond is a 69 y.o. female former patient of Dr. Bronson Ing now presenting to establish follow-up with me.  I reviewed her records and updated the chart.  She was last assessed via telehealth encounter in May.  Her late husband was a former patient of mine.  She presents for a routine visit.  Reports no exertional chest pain or unusual shortness of breath with typical activities.  She is still grieving for the loss of her husband, but states that she does have good family support, her children live very close by.  She is planning to go back to exercising at the gym and is also active in her church.  Follow-up echocardiogram in May of this year revealed LVEF 55%, normal RV contraction, mild mitral stenosis with mean gradient 6 mmHg and moderate mitral regurgitation.  I personally reviewed her ECG today which shows normal sinus rhythm with left bundle branch block.  She is now following with Dr. Nevada Crane for primary care.  Past Medical History:  Diagnosis Date  . Asthma   . Carotid artery disease ([place])   . Diverticulosis of colon (without mention of hemorrhage)   . Essential hypertension   . Functional ovarian cysts   . GERD (gastroesophageal reflux disease)   . Hemorrhoids, external, without mention of complication   . Hormone replacement therapy (postmenopausal)   . IBS (irritable bowel syndrome)   . Migraine   . Mitral valve disease   . Right knee DJD   . Seizures ([place])   . Sleep apnea   . TIA (transient ischemic attack)   . Type 2 diabetes mellitus ([place])     Past Surgical History:  Procedure Laterality Date  . BIOPSY  09/23/2018   Procedure: BIOPSY;  Surgeon: Rogene Houston, MD;   [facility];  Service: Endoscopy;;  duodeneum and stomach  . CHOLECYSTECTOMY  1980  . ENDARTERECTOMY Right 07/01/2017   Procedure: ENDARTERECTOMY CAROTID RIGHT;  Surgeon: Serafina Mitchell, MD;  Location: [facility] OR;  Service: Vascular;  Laterality: Right;  . ESOPHAGOGASTRODUODENOSCOPY N/A 09/23/2018   Procedure: ESOPHAGOGASTRODUODENOSCOPY (EGD);  Surgeon: Rogene Houston, MD;  [facility];  Service: Endoscopy;  Laterality: N/A;  2:45-moved to 1:45 per Lelon Frohlich  . LEFT HEART CATHETERIZATION WITH CORONARY ANGIOGRAM N/A 11/18/2013   Procedure: LEFT HEART CATHETERIZATION WITH CORONARY ANGIOGRAM;  Surgeon: Burnell Blanks, MD;  Location: [facility] CATH LAB;  Service: Cardiovascular;  Laterality: N/A;  . OOPHORECTOMY     2012 Dr. Adah Perl in [place]  . PATCH ANGIOPLASTY Right 07/01/2017   Procedure: RIGHT CAROTID PATCH ANGIOPLASTY;  Surgeon: Serafina Mitchell, MD;  [facility] OR;  Service: Vascular;  Laterality: Right;  . TONSILLECTOMY    . TOTAL KNEE ARTHROPLASTY Right 09/02/2012   Procedure: TOTAL KNEE ARTHROPLASTY- right;  Surgeon: Ninetta Lights, MD;  Location: [place];  Service: Orthopedics;  Laterality: Right;  . TUBAL LIGATION    . TYMPANOMASTOIDECTOMY Left 04/02/2016   Procedure: LEFT CANAL WALL DOWN TYMPANOMASTOIDECTOMY;  Surgeon: Leta Baptist, MD;  Location: [place];  Service: ENT;  Laterality: Left;  [name] VAGINAL HYSTERECTOMY  1990  . WOUND EXPLORATION Right 07/02/2017   Procedure: RIGHT NECK  EXPLORATION WITH INTRAOPERATIVE ULTRASOUND;  Surgeon: Angelia Mould, MD;  Location: [facility] OR;  Service: Vascular;  Laterality: Right;    Current Outpatient Medications  Medication Sig Dispense Refill  . Acetaminophen (TYLENOL ARTHRITIS EXT RELIEF PO) Take 650 mg by mouth daily.     [name] albuterol (VENTOLIN HFA) 108 (90 Base) MCG/ACT inhaler Inhale 2 puffs into the lungs every 4 (four) hours as needed for shortness of breath.    [name] aspirin EC 81 MG tablet Take 1 tablet (81 mg  total) by mouth daily.    . carvedilol (COREG) 3.125 MG tablet Take 3.125 mg by mouth 2 (two) times daily with a meal.    . cholecalciferol (VITAMIN D) 1000 UNITS tablet Take 1,000 Units by mouth daily.    . diazepam (VALIUM) 10 MG tablet Take 10 mg by mouth daily as needed.    . DULoxetine (CYMBALTA) 30 MG capsule Take 30 mg by mouth daily.    . furosemide (LASIX) 40 MG tablet Take 40 mg by mouth 2 (two) times daily.     [name] lactulose (CHRONULAC) 10 GM/15ML solution Take 20 g by mouth 4 (four) times daily.     [name] lamoTRIgine (LAMICTAL) 200 MG tablet Take 200 mg by mouth 2 (two) times daily.     [name] levocetirizine (XYZAL) 5 MG tablet levocetirizine 5 mg tablet    . LORazepam (ATIVAN) 0.5 MG tablet Take 0.5 mg by mouth 2 (two) times daily as needed.    . Methylcobalamin (B12-ACTIVE PO) B12    . ondansetron (ZOFRAN) 4 MG tablet Take 4 mg by mouth every 8 (eight) hours as needed for nausea or vomiting.    . Oxycodone HCl 10 MG TABS Take 10 mg by mouth 3 (three) times daily as needed.    . pantoprazole (PROTONIX) 40 MG tablet Take 1 tablet (40 mg total) by mouth daily before breakfast. 90 tablet 3  . Pediatric Multivitamins-Iron (FLINTSTONES PLUS IRON) chewable tablet Chew 1 tablet by mouth 2 (two) times daily.    . potassium chloride SA (K-DUR,KLOR-CON) 20 MEQ tablet Take 20 mEq by mouth daily.      Current Facility-Administered Medications  Medication Dose Route Frequency Provider Last Rate Last Admin  . magic mouthwash  5 mL Oral TID PRN Minus Liberty, PA-C       Allergies:  Patient has no known allergies.   ROS: Hearing loss.  Physical Exam: VS:  BP (!) 142/78   Pulse 60   Ht 5' 4"  (1.626 m)   Wt 154 lb (69.9 kg)   SpO2 99%   BMI 26.43 kg/m , BMI Body mass index is 26.43 kg/m.  Wt Readings from Last 3 Encounters:  12/23/19 154 lb (69.9 kg)  10/06/19 153 lb (69.4 kg)  09/21/19 151 lb (68.5 kg)    General: Patient appears comfortable at rest. HEENT: Conjunctiva and lids normal,  wearing a mask. Neck: Supple, no elevated JVP or carotid bruits, no thyromegaly. Lungs: Clear to auscultation, nonlabored breathing at rest. Cardiac: Regular rate and rhythm, no S3, 2/6 systolic murmur, paradoxically split S2, no pericardial rub. Extremities: No pitting edema, distal pulses 2+.  ECG:  An ECG dated 05/29/2017 was personally reviewed today and demonstrated:  Sinus rhythm with left bundle branch block.  Recent Labwork: 09/21/2019: ALT 17; AST 32; BUN 11; Creat 1.03; Hemoglobin 13.8; Platelets 156; Potassium 4.3; Sodium 142   Other Studies Reviewed Today:  Echocardiogram 06/29/2019: 1. Left ventricular ejection fraction, by estimation, is 55%. The left  ventricle has normal function. The left ventricle has no regional wall  motion abnormalities. Left ventricular diastolic parameters are  indeterminate.  2. Right ventricular systolic function is normal. The right ventricular  size is normal. Tricuspid regurgitation signal is inadequate for assessing  PA pressure.  3. Left atrial size was severely dilated.  4. The mitral valve is rheumatic. Moderate mitral valve regurgitation.  Mild mitral stenosis. The mean mitral valve gradient is 6.0 mmHg.  5. The aortic valve is tricuspid. Aortic valve regurgitation is trivial.  Mild aortic valve sclerosis is present, with no evidence of aortic valve  stenosis.  6. The inferior vena cava is normal in size with greater than 50%  respiratory variability, suggesting right atrial pressure of 3 mmHg.   Carotid Dopplers 10/06/2019: Summary:  Right Carotid: Velocities in the right ICA are consistent with a 1-39%  stenosis.   Left Carotid: Velocities in the left ICA are consistent with a 1-39%  stenosis.   Assessment and Plan:  1.  Mitral valve disease, echocardiogram from May of this year showed mild mitral stenosis and moderate mitral regurgitation.  Continue to follow, repeat echocardiogram will be obtained next year.  She does not  report any increasing shortness of breath and remains on diuretic with potassium supplement.  2.  Asymptomatic, mild carotid artery disease with follow-up Dopplers in August of this year.  Continue aspirin.  3.  Essential hypertension, currently on Coreg.  Keep follow-up with Dr. Nevada Crane.  Medication Adjustments/Labs and Tests Ordered: Current medicines are reviewed at length with the patient today.  Concerns regarding medicines are outlined above.   Tests Ordered: Orders Placed This Encounter  Procedures  . EKG 12-Lead  . ECHOCARDIOGRAM COMPLETE    Medication Changes: No orders of the defined types were placed in this encounter.   Disposition:  Follow up echocardiogram and clinical visit in May of next year.  Signed, Satira Sark, MD, [facility] 12/23/2019 12:07 PM    [facility] [place], [place], [place] [address] Phone: [phone]; Fax: [phone]

## 2019-12-23 ENCOUNTER — Ambulatory Visit: Payer: PPO | Admitting: Cardiology

## 2019-12-23 ENCOUNTER — Encounter: Payer: Self-pay | Admitting: Cardiology

## 2019-12-23 VITALS — BP 142/78 | HR 60 | Ht 64.0 in | Wt 154.0 lb

## 2019-12-23 DIAGNOSIS — I059 Rheumatic mitral valve disease, unspecified: Secondary | ICD-10-CM

## 2019-12-23 DIAGNOSIS — I10 Essential (primary) hypertension: Secondary | ICD-10-CM | POA: Diagnosis not present

## 2019-12-23 DIAGNOSIS — I6523 Occlusion and stenosis of bilateral carotid arteries: Secondary | ICD-10-CM

## 2019-12-23 NOTE — Patient Instructions (Addendum)
Medication Instructions:    Your physician recommends that you continue on your current medications as directed. Please refer to the Current Medication list given to you today.  Labwork:  None  Testing/Procedures: Your physician has requested that you have an echocardiogram in 6 months just before your next visit. Echocardiography is a painless test that uses sound waves to create images of your heart. It provides your doctor with information about the size and shape of your heart and how well your heart's chambers and valves are working. This procedure takes approximately one hour. There are no restrictions for this procedure.  Follow-Up:  Your physician recommends that you schedule a follow-up appointment in: 6 months.  Any Other Special Instructions Will Be Listed Below (If Applicable).  If you need a refill on your cardiac medications before your next appointment, please call your pharmacy.

## 2019-12-29 DIAGNOSIS — H95122 Granulation of postmastoidectomy cavity, left ear: Secondary | ICD-10-CM | POA: Diagnosis not present

## 2019-12-29 DIAGNOSIS — R070 Pain in throat: Secondary | ICD-10-CM | POA: Diagnosis not present

## 2020-01-24 DIAGNOSIS — K7460 Unspecified cirrhosis of liver: Secondary | ICD-10-CM | POA: Diagnosis not present

## 2020-01-24 DIAGNOSIS — K219 Gastro-esophageal reflux disease without esophagitis: Secondary | ICD-10-CM | POA: Diagnosis not present

## 2020-01-24 DIAGNOSIS — F419 Anxiety disorder, unspecified: Secondary | ICD-10-CM | POA: Diagnosis not present

## 2020-01-24 DIAGNOSIS — R188 Other ascites: Secondary | ICD-10-CM | POA: Diagnosis not present

## 2020-01-24 DIAGNOSIS — N1831 Chronic kidney disease, stage 3a: Secondary | ICD-10-CM | POA: Diagnosis not present

## 2020-01-24 DIAGNOSIS — E559 Vitamin D deficiency, unspecified: Secondary | ICD-10-CM | POA: Diagnosis not present

## 2020-01-24 DIAGNOSIS — R110 Nausea: Secondary | ICD-10-CM | POA: Diagnosis not present

## 2020-01-24 DIAGNOSIS — F331 Major depressive disorder, recurrent, moderate: Secondary | ICD-10-CM | POA: Diagnosis not present

## 2020-01-24 DIAGNOSIS — I509 Heart failure, unspecified: Secondary | ICD-10-CM | POA: Diagnosis not present

## 2020-01-24 DIAGNOSIS — R070 Pain in throat: Secondary | ICD-10-CM | POA: Diagnosis not present

## 2020-01-24 DIAGNOSIS — E539 Vitamin B deficiency, unspecified: Secondary | ICD-10-CM | POA: Diagnosis not present

## 2020-01-24 DIAGNOSIS — E785 Hyperlipidemia, unspecified: Secondary | ICD-10-CM | POA: Diagnosis not present

## 2020-01-27 DIAGNOSIS — E539 Vitamin B deficiency, unspecified: Secondary | ICD-10-CM | POA: Diagnosis not present

## 2020-01-27 DIAGNOSIS — E559 Vitamin D deficiency, unspecified: Secondary | ICD-10-CM | POA: Diagnosis not present

## 2020-01-27 DIAGNOSIS — I509 Heart failure, unspecified: Secondary | ICD-10-CM | POA: Diagnosis not present

## 2020-01-27 DIAGNOSIS — K7460 Unspecified cirrhosis of liver: Secondary | ICD-10-CM | POA: Diagnosis not present

## 2020-01-27 DIAGNOSIS — N1831 Chronic kidney disease, stage 3a: Secondary | ICD-10-CM | POA: Diagnosis not present

## 2020-01-27 DIAGNOSIS — F419 Anxiety disorder, unspecified: Secondary | ICD-10-CM | POA: Diagnosis not present

## 2020-01-27 DIAGNOSIS — E785 Hyperlipidemia, unspecified: Secondary | ICD-10-CM | POA: Diagnosis not present

## 2020-01-27 DIAGNOSIS — J4540 Moderate persistent asthma, uncomplicated: Secondary | ICD-10-CM | POA: Diagnosis not present

## 2020-01-27 DIAGNOSIS — K219 Gastro-esophageal reflux disease without esophagitis: Secondary | ICD-10-CM | POA: Diagnosis not present

## 2020-01-27 DIAGNOSIS — R110 Nausea: Secondary | ICD-10-CM | POA: Diagnosis not present

## 2020-01-27 DIAGNOSIS — G894 Chronic pain syndrome: Secondary | ICD-10-CM | POA: Diagnosis not present

## 2020-01-27 DIAGNOSIS — R188 Other ascites: Secondary | ICD-10-CM | POA: Diagnosis not present

## 2020-02-01 DIAGNOSIS — J4540 Moderate persistent asthma, uncomplicated: Secondary | ICD-10-CM | POA: Diagnosis not present

## 2020-02-01 DIAGNOSIS — R070 Pain in throat: Secondary | ICD-10-CM | POA: Diagnosis not present

## 2020-02-01 DIAGNOSIS — M542 Cervicalgia: Secondary | ICD-10-CM | POA: Diagnosis not present

## 2020-02-01 DIAGNOSIS — G894 Chronic pain syndrome: Secondary | ICD-10-CM | POA: Diagnosis not present

## 2020-02-03 DIAGNOSIS — R070 Pain in throat: Secondary | ICD-10-CM | POA: Diagnosis not present

## 2020-02-03 DIAGNOSIS — J4540 Moderate persistent asthma, uncomplicated: Secondary | ICD-10-CM | POA: Diagnosis not present

## 2020-02-03 DIAGNOSIS — J069 Acute upper respiratory infection, unspecified: Secondary | ICD-10-CM | POA: Diagnosis not present

## 2020-02-14 DIAGNOSIS — J069 Acute upper respiratory infection, unspecified: Secondary | ICD-10-CM | POA: Diagnosis not present

## 2020-02-14 DIAGNOSIS — J4540 Moderate persistent asthma, uncomplicated: Secondary | ICD-10-CM | POA: Diagnosis not present

## 2020-02-14 DIAGNOSIS — R070 Pain in throat: Secondary | ICD-10-CM | POA: Diagnosis not present

## 2020-03-16 ENCOUNTER — Encounter (INDEPENDENT_AMBULATORY_CARE_PROVIDER_SITE_OTHER): Payer: Self-pay | Admitting: *Deleted

## 2020-03-28 ENCOUNTER — Encounter (INDEPENDENT_AMBULATORY_CARE_PROVIDER_SITE_OTHER): Payer: Self-pay | Admitting: Internal Medicine

## 2020-03-28 ENCOUNTER — Ambulatory Visit (INDEPENDENT_AMBULATORY_CARE_PROVIDER_SITE_OTHER): Payer: PPO | Admitting: Internal Medicine

## 2020-03-28 ENCOUNTER — Other Ambulatory Visit (INDEPENDENT_AMBULATORY_CARE_PROVIDER_SITE_OTHER): Payer: Self-pay

## 2020-03-28 ENCOUNTER — Other Ambulatory Visit (INDEPENDENT_AMBULATORY_CARE_PROVIDER_SITE_OTHER): Payer: Self-pay | Admitting: *Deleted

## 2020-03-28 ENCOUNTER — Other Ambulatory Visit: Payer: Self-pay

## 2020-03-28 VITALS — BP 130/78 | HR 82 | Temp 98.0°F | Ht 64.0 in | Wt 156.8 lb

## 2020-03-28 DIAGNOSIS — K7290 Hepatic failure, unspecified without coma: Secondary | ICD-10-CM | POA: Insufficient documentation

## 2020-03-28 DIAGNOSIS — K7460 Unspecified cirrhosis of liver: Secondary | ICD-10-CM

## 2020-03-28 DIAGNOSIS — K219 Gastro-esophageal reflux disease without esophagitis: Secondary | ICD-10-CM

## 2020-03-28 DIAGNOSIS — K7682 Hepatic encephalopathy: Secondary | ICD-10-CM | POA: Insufficient documentation

## 2020-03-28 DIAGNOSIS — K279 Peptic ulcer, site unspecified, unspecified as acute or chronic, without hemorrhage or perforation: Secondary | ICD-10-CM

## 2020-03-28 DIAGNOSIS — K760 Fatty (change of) liver, not elsewhere classified: Secondary | ICD-10-CM

## 2020-03-28 DIAGNOSIS — G9341 Metabolic encephalopathy: Secondary | ICD-10-CM | POA: Insufficient documentation

## 2020-03-28 DIAGNOSIS — D6489 Other specified anemias: Secondary | ICD-10-CM

## 2020-03-28 DIAGNOSIS — D509 Iron deficiency anemia, unspecified: Secondary | ICD-10-CM

## 2020-03-28 MED ORDER — PANTOPRAZOLE SODIUM 40 MG PO TBEC: 40.0000 mg | DELAYED_RELEASE_TABLET | Freq: Every day | ORAL | 3 refills | Status: DC

## 2020-03-28 MED ORDER — LACTULOSE 10 GM/15ML PO SOLN: 20.0000 g | Freq: Three times a day (TID) | ORAL | 5 refills | Status: DC

## 2020-03-28 NOTE — Progress Notes (Signed)
Presenting complaint;  Follow for chronic liver disease and GERD.  Database and subjective:  Patient is 70 year old Caucasian female who was diagnosed with cirrhosis in June 2019 when she presented to [facility] with, and was transferred to [place].  She was diagnosed with hepatic encephalopathy and responded to therapy.  She was last seen on 09/21/2019.  She had ultrasound elastography confirming hepatic fibrosis.  Etiology was felt to be fatty liver.  Other studies were recommended on her last visit in but she has not been to the lab yet.  She states she is doing well from GI standpoint.  He rarely has heartburn.  She denies dysphagia nausea vomiting or abdominal pain.  Most of her bowel movements are explosive bowel movements.  She has at least 3 to 4/day.  Her appetite is good.  She has gained 5 pounds since her last visit.  She says she loves to walk with has not picked up her routine yet.  He denies pruritus.  She complains of neck pain.  She says she had 3 shots but they only helped for 24 hours each time.  She is planning to follow-up with her back specialist. Patient saw Dr. Benjamine Mola and was given gabapentin but she has not started yet.  She does not feel she needs to take Xyzal while she is on Singulair.  Current Medications: Outpatient Encounter Medications as of 03/28/2020  Medication Sig  . Acetaminophen (TYLENOL ARTHRITIS EXT RELIEF PO) Take 650 mg by mouth daily.   [name] albuterol (VENTOLIN HFA) 108 (90 Base) MCG/ACT inhaler Inhale 2 puffs into the lungs every 4 (four) hours as needed for shortness of breath.  [name] aspirin EC 81 MG tablet Take 1 tablet (81 mg total) by mouth daily.  . carvedilol (COREG) 3.125 MG tablet Take 3.125 mg by mouth 2 (two) times daily with a meal.  . cholecalciferol (VITAMIN D) 1000 UNITS tablet Take 1,000 Units by mouth daily.  . diazepam (VALIUM) 10 MG tablet Take 10 mg by mouth daily as needed.  . DULoxetine (CYMBALTA) 30 MG capsule Take 30 mg by mouth daily.  .  furosemide (LASIX) 40 MG tablet Take 40 mg by mouth 2 (two) times daily.   [name] gabapentin (NEURONTIN) 300 MG capsule Take 300 mg by mouth at bedtime.  [name] lactulose (CHRONULAC) 10 GM/15ML solution Take 20 g by mouth 4 (four) times daily.   [name] lamoTRIgine (LAMICTAL) 200 MG tablet Take 200 mg by mouth 2 (two) times daily.  [name] levocetirizine (XYZAL) 5 MG tablet Take 5 mg by mouth every evening.  [name] LORazepam (ATIVAN) 0.5 MG tablet Take 0.5 mg by mouth 2 (two) times daily as needed.  . Methylcobalamin (B12-ACTIVE PO) daily.  . ondansetron (ZOFRAN) 4 MG tablet Take 4 mg by mouth every 8 (eight) hours as needed for nausea or vomiting.  . Oxycodone HCl 10 MG TABS Take 10 mg by mouth 3 (three) times daily as needed.  . pantoprazole (PROTONIX) 40 MG tablet Take 1 tablet (40 mg total) by mouth daily before breakfast.  . Pediatric Multivitamins-Iron (FLINTSTONES PLUS IRON) chewable tablet Chew 1 tablet by mouth 2 (two) times daily.  . potassium chloride SA (K-DUR,KLOR-CON) 20 MEQ tablet Take 20 mEq by mouth daily.    Facility-Administered Encounter Medications as of 03/28/2020  Medication  . magic mouthwash     Objective: Blood pressure 130/78, pulse 82, temperature 98 F (36.7 C), temperature source Oral, height 5' 4"  (1.626 m), weight 156 lb 12.8 oz (71.1 kg). Patient  is alert and in no acute distress. She is wearing a mask. Asterixis absent. Conjunctiva is pink. Sclera is nonicteric Oropharyngeal mucosa is normal. She has upper lower dentures. No neck masses or thyromegaly noted. Cardiac exam with regular rhythm normal S1 and S2. No murmur or gallop noted. Lungs are clear to auscultation. Abdomen is symmetrical.  She has a horizontal scar in right upper quadrant.  On palpation abdomen is soft.  Spleen tip is palpable.  Left lobe of the liver is easily palpable is firm and nontender. No LE edema or clubbing noted.  Labs/studies Results:  CBC Latest Ref Rng & Units 09/21/2019 08/05/2018 08/15/2017  WBC  3.8 - 10.8 Thousand/uL 7.6 6.6 11.2(H)  Hemoglobin 11.7 - 15.5 g/dL 13.8 11.0(L) 11.1(L)  Hematocrit 35.0 - 45.0 % 41.0 35.8 36.1  Platelets 140 - 400 Thousand/uL 156 205 217    CMP Latest Ref Rng & Units 09/21/2019 08/15/2017 07/02/2017  Glucose 65 - 139 mg/dL 90 129(H) 145(H)  BUN 7 - 25 mg/dL 11 13 21(H)  Creatinine 0.50 - 0.99 mg/dL 1.03(H) 0.87 0.93  Sodium 135 - 146 mmol/L 142 142 138  Potassium 3.5 - 5.3 mmol/L 4.3 3.2(L) 4.5  Chloride 98 - 110 mmol/L 99 101 104  CO2 20 - 32 mmol/L 37(H) 29 24  Calcium 8.6 - 10.4 mg/dL 9.6 8.6(L) 8.5(L)  Total Protein 6.1 - 8.1 g/dL 7.1 7.3 -  Total Bilirubin 0.2 - 1.2 mg/dL 0.6 1.0 -  Alkaline Phos 38 - 126 U/L - 79 -  AST 10 - 35 U/L 32 35 -  ALT 6 - 29 U/L 17 13 -    Hepatic Function Latest Ref Rng & Units 09/21/2019 08/15/2017 07/01/2017  Total Protein 6.1 - 8.1 g/dL 7.1 7.3 7.6  Albumin 3.5 - 5.0 g/dL - 3.7 3.8  AST 10 - 35 U/L 32 35 29  ALT 6 - 29 U/L 17 13 12(L)  Alk Phosphatase 38 - 126 U/L - 79 93  Total Bilirubin 0.2 - 1.2 mg/dL 0.6 1.0 0.6  Bilirubin, Direct <=0.2 mg/dL - - -    Serum ammonia was 32 on 09/21/2019  Assessment:  #1.  Hepatic encephalopathy.  She appears to be at her baseline.  Serum ammonia was normal 6 months ago.  Will decrease lactulose dose to 3 times a day.  Goal is for her to have 3-4 bowel movements per day and not more. I am very concerned as she is on multiple medications which could alter with cognitive function.  #2.  Cirrhosis.  Etiology felt to be fatty liver.  She was supposed to have biochemical markers to look for other etiologies but she has not had them.  Orders are already in place in epic.  She is also due for [facility] screening.  #3.  GERD.  She is doing well with therapy.   Plan:  Patient advised to go to the lab for blood tests which were requested back in August 2021. Schedule abdominal ultrasound. Decrease lactulose to 20 g p.o. 3 times daily. New prescription for pantoprazole given to the  patient 40 mg daily 90 doses with 3 refills. Once again patient advised to talk with primary care physician about getting hepatitis A and B vaccination. She will undergo esophagogastroduodenoscopy to screen for varices once Covid pandemic is over. Office visit in 6 months.

## 2020-03-28 NOTE — Patient Instructions (Signed)
Please ask primary care physician to give you hepatitis A and B vaccination. Physician will call with results of blood test and ultrasound when completed. Will plan esophagogastroduodenoscopy when Covid pandemic over.

## 2020-03-28 NOTE — Progress Notes (Signed)
a 

## 2020-03-29 ENCOUNTER — Ambulatory Visit (INDEPENDENT_AMBULATORY_CARE_PROVIDER_SITE_OTHER): Payer: Medicare HMO | Admitting: Family Medicine

## 2020-03-29 ENCOUNTER — Encounter: Payer: Self-pay | Admitting: Family Medicine

## 2020-03-29 VITALS — BP 128/68 | HR 76 | Temp 96.5°F | Ht 64.0 in | Wt 157.6 lb

## 2020-03-29 DIAGNOSIS — J302 Other seasonal allergic rhinitis: Secondary | ICD-10-CM

## 2020-03-29 DIAGNOSIS — R569 Unspecified convulsions: Secondary | ICD-10-CM | POA: Diagnosis not present

## 2020-03-29 DIAGNOSIS — F411 Generalized anxiety disorder: Secondary | ICD-10-CM | POA: Diagnosis not present

## 2020-03-29 DIAGNOSIS — F339 Major depressive disorder, recurrent, unspecified: Secondary | ICD-10-CM

## 2020-03-29 DIAGNOSIS — Z1231 Encounter for screening mammogram for malignant neoplasm of breast: Secondary | ICD-10-CM

## 2020-03-29 DIAGNOSIS — I429 Cardiomyopathy, unspecified: Secondary | ICD-10-CM | POA: Diagnosis not present

## 2020-03-29 DIAGNOSIS — K7460 Unspecified cirrhosis of liver: Secondary | ICD-10-CM

## 2020-03-29 DIAGNOSIS — I10 Essential (primary) hypertension: Secondary | ICD-10-CM | POA: Diagnosis not present

## 2020-03-29 DIAGNOSIS — K219 Gastro-esophageal reflux disease without esophagitis: Secondary | ICD-10-CM | POA: Diagnosis not present

## 2020-03-29 DIAGNOSIS — I739 Peripheral vascular disease, unspecified: Secondary | ICD-10-CM

## 2020-03-29 DIAGNOSIS — E1165 Type 2 diabetes mellitus with hyperglycemia: Secondary | ICD-10-CM

## 2020-03-29 DIAGNOSIS — Z7689 Persons encountering health services in other specified circumstances: Secondary | ICD-10-CM

## 2020-03-29 LAB — BAYER DCA HB A1C WAIVED: HB A1C (BAYER DCA - WAIVED): 5.7 % (ref <7.0)

## 2020-03-29 MED ORDER — CETIRIZINE HCL 10 MG PO TABS: 10.0000 mg | ORAL_TABLET | Freq: Every day | ORAL | 2 refills | Status: DC

## 2020-03-29 NOTE — Patient Instructions (Signed)
Arthritis Arthritis is a term that is commonly used to refer to joint pain or joint disease. There are more than 100 types of arthritis. What are the causes? The most common cause of this condition is wear and tear of a joint. Other causes include:  Gout.  Inflammation of a joint.  An infection of a joint.  Sprains and other injuries near the joint.  A reaction to medicines or drugs, or an allergic reaction. In some cases, the cause may not be known. What are the signs or symptoms? The main symptom of this condition is pain in the joint during movement. Other symptoms include:  Redness, swelling, or stiffness at a joint.  Warmth coming from the joint.  Fever.  Overall feeling of illness. How is this diagnosed? This condition may be diagnosed with a physical exam and tests, including:  Blood tests.  Urine tests.  Imaging tests, such as X-rays, an MRI, or a CT scan. Sometimes, fluid is removed from a joint for testing. How is this treated? This condition may be treated with:  Treatment of the cause, if it is known.  Rest.  Raising (elevating) the joint.  Applying cold or hot packs to the joint.  Medicines to improve symptoms and reduce inflammation.  Injections of a steroid such as cortisone into the joint to help reduce pain and inflammation. Depending on the cause of your arthritis, you may need to make lifestyle changes to reduce stress on your joint. Changes may include:  Exercising more.  Losing weight. Follow these instructions at home: Medicines  Take over-the-counter and prescription medicines only as told by your health care provider.  Do not take aspirin to relieve pain if your health care provider thinks that gout may be causing your pain. Activity  Rest your joint if told by your health care provider. Rest is important when your disease is active and your joint feels painful, swollen, or stiff.  Avoid activities that make the pain worse. It is  important to balance activity with rest.  Exercise your joint regularly with range-of-motion exercises as told by your health care provider. Try doing low-impact exercise, such as: ? Swimming. ? Water aerobics. ? Biking. ? Walking. Managing pain, stiffness, and swelling  If directed, put ice on the joint. ? Put ice in a plastic bag. ? Place a towel between your skin and the bag. ? Leave the ice on for 20 minutes, 2-3 times per day.  If your joint is swollen, raise (elevate) it above the level of your heart if directed by your health care provider.  If your joint feels stiff in the morning, try taking a warm shower.  If directed, apply heat to the affected area as often as told by your health care provider. Use the heat source that your health care provider recommends, such as a moist heat pack or a heating pad. If you have diabetes, do not apply heat without permission from your health care provider. To apply heat: ? Place a towel between your skin and the heat source. ? Leave the heat on for 20-30 minutes. ? Remove the heat if your skin turns bright red. This is especially important if you are unable to feel pain, heat, or cold. You may have a greater risk of getting burned.      General instructions  Do not use any products that contain nicotine or tobacco, such as cigarettes, e-cigarettes, and chewing tobacco. If you need help quitting, ask your health care provider.  Keep all follow-up visits as told by your health care provider. This is important. Contact a health care provider if:  The pain gets worse.  You have a fever. Get help right away if:  You develop severe joint pain, swelling, or redness.  Many joints become painful and swollen.  You develop severe back pain.  You develop severe weakness in your leg.  You cannot control your bladder or bowels. Summary  Arthritis is a term that is commonly used to refer to joint pain or joint disease. There are more than  100 types of arthritis.  The most common cause of this condition is wear and tear of a joint. Other causes include gout, inflammation or infection of the joint, sprains, or allergies.  Symptoms of this condition include redness, swelling, or stiffness of the joint. Other symptoms include warmth, fever, or feeling ill.  This condition is treated with rest, elevation, medicines, and applying cold or hot packs.  Follow your health care provider's instructions about medicines, activity, exercises, and other home care treatments. This information is not intended to replace advice given to you by your health care provider. Make sure you discuss any questions you have with your health care provider. Document Revised: 01/12/2018 Document Reviewed: 01/12/2018 Elsevier Patient Education  2021 [employer].

## 2020-03-29 NOTE — Progress Notes (Signed)
New Patient Office Visit  Subjective:  [name], female    DOB: 20-Dec-1950  Age: 70 y.o. MRN: [id number]  CC:  Chief Complaint  Patient presents with  . Establish Care    HPI Carol Richmond presents to establish care. Her previous PCP was Dr. Nevada Crane. She is followed by multiple specialists: orthopedic, ENT, cardiologist, vascular surgeon, and GI. She used to see a neurologist for her seizure disorder but she no longer does. She denies seizures since starting on Lamictal. She takes oxycodone for chronic pain that is prescribed by Levy Pupa, PA. Dr. Nevada Crane was prescribing her ativan for anxiety. She also takes Cymbalta for anxiety.   1. HTN Complaint with meds - Yes Current Medications - coreg Checking BP at home: no Exercising Regularly - No Watching Salt intake - Yes Pertinent ROS:  Headache - No Fatigue - No Visual Disturbances - No Chest pain - No Dyspnea - No Palpitations - No LE edema - No They report good compliance with medications and can restate their regimen by memory. No medication side effects.   2. T2DM Diet controlled. She has not had a recent eye exam.   3. GERD  Compliant with medications - Yes Current medications - protonix Adverse side effects - No Red Flags (weight loss, hematochezia, melena, weight loss, early satiety, fevers, odynophagia, or persistent vomiting) - No   Depression screen [facility] 2/9 03/29/2020 09/24/2017 07/04/2017  Decreased Interest 0 0 0  Down, Depressed, Hopeless 0 0 0  PHQ - 2 Score 0 0 0    Family, social, and smoking history reviewed.   BP Readings from Last 3 Encounters:  03/29/20 128/68  03/28/20 130/78  12/23/19 (!) 142/78   CMP Latest Ref Rng & Units 09/21/2019 08/15/2017 07/02/2017  Glucose 65 - 139 mg/dL 90 129(H) 145(H)  BUN 7 - 25 mg/dL 11 13 21(H)  Creatinine 0.50 - 0.99 mg/dL 1.03(H) 0.87 0.93  Sodium 135 - 146 mmol/L 142 142 138  Potassium 3.5 - 5.3 mmol/L 4.3 3.2(L) 4.5  Chloride 98 -  110 mmol/L 99 101 104  CO2 20 - 32 mmol/L 37(H) 29 24  Calcium 8.6 - 10.4 mg/dL 9.6 8.6(L) 8.5(L)  Total Protein 6.1 - 8.1 g/dL 7.1 7.3 -  Total Bilirubin 0.2 - 1.2 mg/dL 0.6 1.0 -  Alkaline Phos 38 - 126 U/L - 79 -  AST 10 - 35 U/L 32 35 -  ALT 6 - 29 U/L 17 13 -      Past Medical History:  Diagnosis Date  . Asthma   . Carotid artery disease ([place])   . Diverticulosis of colon (without mention of hemorrhage)   . Essential hypertension   . Functional ovarian cysts   . GERD (gastroesophageal reflux disease)   . Hemorrhoids, external, without mention of complication   . Hormone replacement therapy (postmenopausal)   . IBS (irritable bowel syndrome)   . Migraine   . Mitral valve disease   . Right knee DJD   . Seizures ([place])   . Sleep apnea   . TIA (transient ischemic attack)   . Type 2 diabetes mellitus ([place])     Past Surgical History:  Procedure Laterality Date  . BIOPSY  09/23/2018   Procedure: BIOPSY;  Surgeon: Rogene Houston, MD;  Location: [facility] END[facility];  Service: Endoscopy;;  duodeneum and stomach  . CHOLECYSTECTOMY  1980  . ENDARTERECTOMY Right 07/01/2017   Procedure: ENDARTERECTOMY CAROTID RIGHT;  Surgeon: Serafina Mitchell, MD;  Location: [facility] OR;  Service: Vascular;  Laterality: Right;  . ESOPHAGOGASTRODUODENOSCOPY N/A 09/23/2018   Procedure: ESOPHAGOGASTRODUODENOSCOPY (EGD);  Surgeon: Rogene Houston, MD;  Location: [facility];  Service: Endoscopy;  Laterality: N/A;  2:45-moved to 1:45 per Lelon Frohlich  . LEFT HEART CATHETERIZATION WITH CORONARY ANGIOGRAM N/A 11/18/2013   Procedure: LEFT HEART CATHETERIZATION WITH CORONARY ANGIOGRAM;  Surgeon: Burnell Blanks, MD;  Location: [facility] CATH LAB;  Service: Cardiovascular;  Laterality: N/A;  . OOPHORECTOMY     2012 Dr. Adah Perl in [place]  . PATCH ANGIOPLASTY Right 07/01/2017   Procedure: RIGHT CAROTID PATCH ANGIOPLASTY;  Surgeon: Serafina Mitchell, MD;  Location: [facility] OR;  Service: Vascular;  Laterality: Right;  . TONSILLECTOMY    .  TOTAL KNEE ARTHROPLASTY Right 09/02/2012   Procedure: TOTAL KNEE ARTHROPLASTY- right;  Surgeon: Ninetta Lights, MD;  Location: [place];  Service: Orthopedics;  Laterality: Right;  . TUBAL LIGATION    . TYMPANOMASTOIDECTOMY Left 04/02/2016   Procedure: LEFT CANAL WALL DOWN TYMPANOMASTOIDECTOMY;  Surgeon: Leta Baptist, MD;  Location: [place];  Service: ENT;  Laterality: Left;  [name] VAGINAL HYSTERECTOMY  1990  . WOUND EXPLORATION Right 07/02/2017   Procedure: RIGHT NECK EXPLORATION WITH INTRAOPERATIVE ULTRASOUND;  Surgeon: Angelia Mould, MD;  Location[facility];  Service: Vascular;  Laterality: Right;    Family History  Problem Relation Age of Onset  . Heart disease Mother   . Diabetes Mother   . Heart disease Brother   . Cancer Sister   . Heart disease Brother   . Heart disease Brother   . Heart disease Son     Social History   Socioeconomic History  . Marital status: Widowed    Spouse name: Not on file  . Number of children: Not on file  . Years of education: Not on file  . Highest education level: Not on file  Occupational History  . Not on file  Tobacco Use  . Smoking status: Never Smoker  . Smokeless tobacco: Never Used  Vaping Use  . Vaping Use: Never used  Substance and Sexual Activity  . Alcohol use: No    Alcohol/week: 0.0 standard drinks  . Drug use: No  . Sexual activity: Not on file  Other Topics Concern  . Not on file  Social History Narrative  . Not on file   Social Determinants of Health   Financial Resource Strain: Not on file  Food Insecurity: Not on file  Transportation Needs: Not on file  Physical Activity: Not on file  Stress: Not on file  Social Connections: Not on file  Intimate Partner Violence: Not on file    ROS Review of Systems Negative unless specially indicated above in HPI.   Objective:   Today's Vitals: BP 128/68   Pulse 76   Temp (!) 96.5 F (35.8 C)   Ht _0  (1.626 m)   Wt 157 lb 9.6 oz (71.5 kg)   SpO2  97%   BMI 27.05 kg/m   Physical Exam Vitals and nursing note reviewed.  Constitutional:      General: She is not in acute distress.    Appearance: Normal appearance. She is not ill-appearing, toxic-appearing or diaphoretic.  HENT:     Head: Normocephalic and atraumatic.     Nose: Nose normal. No congestion.     Mouth/Throat:     Mouth: Mucous membranes are moist.     Pharynx: Oropharynx is clear. No oropharyngeal exudate or posterior oropharyngeal erythema.  Eyes:     General: No scleral icterus.    Conjunctiva/sclera: Conjunctivae normal.     Pupils: Pupils are equal, round, and reactive to light.  Cardiovascular:     Rate and Rhythm: Normal rate and regular rhythm.     Heart sounds: Normal heart sounds. No murmur heard.   Pulmonary:     Effort: Pulmonary effort is normal. No respiratory distress.     Breath sounds: Normal breath sounds. No wheezing or rales.  Abdominal:     General: Bowel sounds are normal.     Palpations: Abdomen is soft.     Tenderness: There is no abdominal tenderness. There is no guarding or rebound.  Musculoskeletal:     Cervical back: Neck supple. No tenderness.     Right lower leg: No edema.     Left lower leg: No edema.  Skin:    General: Skin is warm and dry.  Neurological:     General: No focal deficit present.     Mental Status: She is alert and oriented to person, place, and time.  Psychiatric:        Mood and Affect: Mood normal.        Behavior: Behavior normal.     Assessment & Plan:   Avrianna was seen today for establish care.  Diagnoses and all orders for this visit:  Type 2 diabetes mellitus with hyperglycemia, without long-term current use of insulin ([place]) Well controlled, diet. A1C is 5.4 today. Labs pending as below. Will complete foot exam at next appointment.  -     CBC with Differential/Platelet -     CMP14+EGFR -     Lipid panel -     Bayer DCA Hb A1c Waived -     Microalbumin / creatinine urine ratio  Primary  hypertension Well controlled on current regimen. Labs pending as below. Low salt diet.  -     CBC with Differential/Platelet -     CMP14+EGFR -     Lipid panel -     TSH -     Bayer DCA Hb A1c Waived  Cardiomyopathy, unspecified type ([place]) Managed by cardiology  PAD (peripheral artery disease) ([place]) Managed by vascular surgery  Cirrhosis, nonalcoholic ([place]) Managed by GI  Seizures ([place]) Stable on Lamictal  Depression, recurrent ([place]) Well controlled on current regimen of cymbalta 30 mg.   Generalized anxiety disorder Previous PCP has been prescribing ativan 0.5 mg BID prn for anxiety. Discussed with patient that controlled medications cannot be prescribed on first visit. She will need to make another appointment for refills and to discuss and sign CSA.   Gastroesophageal reflux disease without esophagitis Well controlled on current regimen of protonix.   Seasonal allergies Zyrtec daily for seasonal allergies.  -     cetirizine (ZYRTEC) 10 MG tablet; Take 1 tablet (10 mg total) by mouth daily.  Encounter for screening mammogram for malignant neoplasm of breast -     MM Digital Screening; Future  Encounter to establish care Reviewed available records in Epic.   Follow-up: Return in about 3 months (around 06/26/2020) for chronic follow up.   The patient indicates understanding of these issues and agrees with the plan.   Carol Perking, FNP

## 2020-03-30 ENCOUNTER — Telehealth: Payer: Self-pay

## 2020-03-30 ENCOUNTER — Other Ambulatory Visit: Payer: Self-pay | Admitting: Family Medicine

## 2020-03-30 LAB — CMP14+EGFR
ALT: 20 IU/L (ref 0–32)
AST: 35 IU/L (ref 0–40)
Albumin/Globulin Ratio: 1.8 (ref 1.2–2.2)
Albumin: 4.7 g/dL (ref 3.8–4.8)
Alkaline Phosphatase: 139 IU/L — ABNORMAL HIGH (ref 44–121)
BUN/Creatinine Ratio: 13 (ref 12–28)
BUN: 12 mg/dL (ref 8–27)
Bilirubin Total: 0.4 mg/dL (ref 0.0–1.2)
CO2: 27 mmol/L (ref 20–29)
Calcium: 9.5 mg/dL (ref 8.7–10.3)
Chloride: 99 mmol/L (ref 96–106)
Creatinine, Ser: 0.91 mg/dL (ref 0.57–1.00)
GFR calc Af Amer: 76 mL/min/{1.73_m2} (ref >59)
GFR calc non Af Amer: 65 mL/min/{1.73_m2} (ref >59)
Globulin, Total: 2.6 g/dL (ref 1.5–4.5)
Glucose: 114 mg/dL — ABNORMAL HIGH (ref 65–99)
Potassium: 3.5 mmol/L (ref 3.5–5.2)
Sodium: 143 mmol/L (ref 134–144)
Total Protein: 7.3 g/dL (ref 6.0–8.5)

## 2020-03-30 LAB — CBC WITH DIFFERENTIAL/PLATELET
Basophils Absolute: 0 10*3/uL (ref 0.0–0.2)
Basos: 0 %
EOS (ABSOLUTE): 0.1 10*3/uL (ref 0.0–0.4)
Eos: 1 %
Hematocrit: 44.3 % (ref 34.0–46.6)
Hemoglobin: 14.8 g/dL (ref 11.1–15.9)
Immature Grans (Abs): 0 10*3/uL (ref 0.0–0.1)
Immature Granulocytes: 0 %
Lymphocytes Absolute: 2.5 10*3/uL (ref 0.7–3.1)
Lymphs: 20 %
MCH: 29.6 pg (ref 26.6–33.0)
MCHC: 33.4 g/dL (ref 31.5–35.7)
MCV: 89 fL (ref 79–97)
Monocytes Absolute: 1.3 10*3/uL — ABNORMAL HIGH (ref 0.1–0.9)
Monocytes: 10 %
Neutrophils Absolute: 8.5 10*3/uL — ABNORMAL HIGH (ref 1.4–7.0)
Neutrophils: 69 %
Platelets: 217 10*3/uL (ref 150–450)
RBC: 5 x10E6/uL (ref 3.77–5.28)
RDW: 12.8 % (ref 11.7–15.4)
WBC: 12.4 10*3/uL — ABNORMAL HIGH (ref 3.4–10.8)

## 2020-03-30 LAB — LIPID PANEL
Chol/HDL Ratio: 3 ratio (ref 0.0–4.4)
Cholesterol, Total: 214 mg/dL — ABNORMAL HIGH (ref 100–199)
HDL: 71 mg/dL (ref >39)
LDL Chol Calc (NIH): 116 mg/dL — ABNORMAL HIGH (ref 0–99)
Triglycerides: 155 mg/dL — ABNORMAL HIGH (ref 0–149)
VLDL Cholesterol Cal: 27 mg/dL (ref 5–40)

## 2020-03-30 LAB — MICROALBUMIN / CREATININE URINE RATIO
Creatinine, Urine: 36.7 mg/dL
Microalb/Creat Ratio: 11 mg/g creat (ref 0–29)
Microalbumin, Urine: 4 ug/mL

## 2020-03-30 LAB — TSH: TSH: 0.941 u[IU]/mL (ref 0.450–4.500)

## 2020-03-30 NOTE — Telephone Encounter (Signed)
Patient's daughter was concerned because patient had been on allergy medications in the past and her previous doctor had taken her off of them, the patient does not know why he did that.  I explained to her you had prescribed this for seasonal allergies and that it was a safe medication for her to take and was actually available OTC without a prescription.  Patient said she is comfortable taking this medication she just wanted to ask so she could let her daughter know.

## 2020-04-03 DIAGNOSIS — M542 Cervicalgia: Secondary | ICD-10-CM | POA: Diagnosis not present

## 2020-04-04 ENCOUNTER — Other Ambulatory Visit: Payer: Self-pay

## 2020-04-04 ENCOUNTER — Ambulatory Visit (HOSPITAL_COMMUNITY)
Admission: RE | Admit: 2020-04-04 | Discharge: 2020-04-04 | Disposition: A | Discharge status: Home / Self Care | Payer: Medicare HMO | Source: Ambulatory Visit | Attending: Internal Medicine | Admitting: Internal Medicine

## 2020-04-04 DIAGNOSIS — K7460 Unspecified cirrhosis of liver: Secondary | ICD-10-CM | POA: Insufficient documentation

## 2020-04-04 DIAGNOSIS — K7682 Hepatic encephalopathy: Secondary | ICD-10-CM

## 2020-04-04 DIAGNOSIS — I712 Thoracic aortic aneurysm, without rupture: Secondary | ICD-10-CM | POA: Diagnosis not present

## 2020-04-04 DIAGNOSIS — K7290 Hepatic failure, unspecified without coma: Secondary | ICD-10-CM | POA: Insufficient documentation

## 2020-04-05 ENCOUNTER — Telehealth (INDEPENDENT_AMBULATORY_CARE_PROVIDER_SITE_OTHER): Payer: Self-pay | Admitting: Internal Medicine

## 2020-04-05 NOTE — Telephone Encounter (Signed)
Patient called regarding test results  - please advise - ph# [phone]

## 2020-04-06 ENCOUNTER — Other Ambulatory Visit (INDEPENDENT_AMBULATORY_CARE_PROVIDER_SITE_OTHER): Payer: Self-pay | Admitting: Internal Medicine

## 2020-04-06 DIAGNOSIS — K7682 Hepatic encephalopathy: Secondary | ICD-10-CM

## 2020-04-06 DIAGNOSIS — K7460 Unspecified cirrhosis of liver: Secondary | ICD-10-CM

## 2020-04-06 DIAGNOSIS — K7290 Hepatic failure, unspecified without coma: Secondary | ICD-10-CM

## 2020-04-06 NOTE — Telephone Encounter (Signed)
I have left Dr.Rehman a note with this information.

## 2020-04-06 NOTE — Telephone Encounter (Signed)
Patient was called and the number below.  The patient's calls are being screened , and my call was declined. Note that Dr.Rehman will be made aware and he will call the patient when he has reviewed the results.  Mitzie - if the patient called the ofice please let her know this.

## 2020-04-06 NOTE — Telephone Encounter (Signed)
Patient left voice mail message wanting Dr Laural Golden to call her at [phone] or [phone] she will be waiting for the call

## 2020-04-13 ENCOUNTER — Other Ambulatory Visit: Payer: Self-pay

## 2020-04-13 ENCOUNTER — Encounter: Payer: Self-pay | Admitting: Family Medicine

## 2020-04-13 ENCOUNTER — Ambulatory Visit (INDEPENDENT_AMBULATORY_CARE_PROVIDER_SITE_OTHER): Payer: Medicare HMO | Admitting: Family Medicine

## 2020-04-13 VITALS — BP 134/71 | HR 66 | Temp 95.7°F | Ht 64.0 in | Wt 157.5 lb

## 2020-04-13 DIAGNOSIS — F339 Major depressive disorder, recurrent, unspecified: Secondary | ICD-10-CM

## 2020-04-13 DIAGNOSIS — F411 Generalized anxiety disorder: Secondary | ICD-10-CM

## 2020-04-13 DIAGNOSIS — Z79899 Other long term (current) drug therapy: Secondary | ICD-10-CM | POA: Diagnosis not present

## 2020-04-13 MED ORDER — DULOXETINE HCL 60 MG PO CPEP: 60.0000 mg | ORAL_CAPSULE | Freq: Every day | ORAL | 3 refills | Status: DC

## 2020-04-13 MED ORDER — LORAZEPAM 0.5 MG PO TABS: 0.5000 mg | ORAL_TABLET | Freq: Two times a day (BID) | ORAL | 2 refills | Status: DC | PRN

## 2020-04-13 NOTE — Patient Instructions (Signed)

## 2020-04-13 NOTE — Progress Notes (Signed)
Established Patient Office Visit  Subjective:  [name], female    DOB: 08/19/1950  Age: 70 y.o. MRN: [id number]  CC:  Chief Complaint  Patient presents with  . Depression  . Anxiety    HPI Carol Richmond presents for anxiety and depression follow up. Carol Richmond's husband died last year and she is still struggling to cope with it. She takes Ativan as needed for anxiety and Cymbalta 30 mg daily. She denies side effects from this. She finds herself crying a lot. She has started to talk to someone at church and has thought about going back to the gym. She is not interested in counseling at this time. Denies SI.   Depression screen [facility] 2/9 04/13/2020 03/29/2020 09/24/2017  Decreased Interest 3 0 0  Down, Depressed, Hopeless 1 0 0  PHQ - 2 Score 4 0 0  Altered sleeping 1 - -  Tired, decreased energy 3 - -  Change in appetite 1 - -  Feeling bad or failure about yourself  0 - -  Trouble concentrating 0 - -  Moving slowly or fidgety/restless 0 - -  Suicidal thoughts 0 - -  PHQ-9 Score 9 - -  Difficult doing work/chores Not difficult at all - -    GAD 7 : Generalized Anxiety Score 04/13/2020  Nervous, Anxious, on Edge 2  Control/stop worrying 0  Worry too much - different things 0  Trouble relaxing 0  Restless 0  Easily annoyed or irritable 0  Afraid - awful might happen 0  Total GAD 7 Score 2     Past Medical History:  Diagnosis Date  . Asthma   . Carotid artery disease ([place])   . Diverticulosis of colon (without mention of hemorrhage)   . Essential hypertension   . Functional ovarian cysts   . GERD (gastroesophageal reflux disease)   . Hemorrhoids, external, without mention of complication   . Hormone replacement therapy (postmenopausal)   . IBS (irritable bowel syndrome)   . Migraine   . Mitral valve disease   . Right knee DJD   . Seizures ([place])   . Sleep apnea   . TIA (transient ischemic attack)   . Type 2 diabetes mellitus ([place])     Past  Surgical History:  Procedure Laterality Date  . BIOPSY  09/23/2018   Procedure: BIOPSY;  Surgeon: Rogene Houston, MD;  [facility];  Service: Endoscopy;;  duodeneum and stomach  . CHOLECYSTECTOMY  1980  . ENDARTERECTOMY Right 07/01/2017   Procedure: ENDARTERECTOMY CAROTID RIGHT;  Surgeon: Serafina Mitchell, MD;  Location: [facility] OR;  Service: Vascular;  Laterality: Right;  . ESOPHAGOGASTRODUODENOSCOPY N/A 09/23/2018   Procedure: ESOPHAGOGASTRODUODENOSCOPY (EGD);  Surgeon: Rogene Houston, MD;  [facility];  Service: Endoscopy;  Laterality: N/A;  2:45-moved to 1:45 per Lelon Frohlich  . LEFT HEART CATHETERIZATION WITH CORONARY ANGIOGRAM N/A 11/18/2013   Procedure: LEFT HEART CATHETERIZATION WITH CORONARY ANGIOGRAM;  Surgeon: Burnell Blanks, MD;  Location: [facility] CATH LAB;  Service: Cardiovascular;  Laterality: N/A;  . OOPHORECTOMY     2012 Dr. Adah Perl in [place]  . PATCH ANGIOPLASTY Right 07/01/2017   Procedure: RIGHT CAROTID PATCH ANGIOPLASTY;  Surgeon: Serafina Mitchell, MD;  Location: [facility] OR;  Service: Vascular;  Laterality: Right;  . TONSILLECTOMY    . TOTAL KNEE ARTHROPLASTY Right 09/02/2012   Procedure: TOTAL KNEE ARTHROPLASTY- right;  Surgeon: Ninetta Lights, MD;  Location: [place];  Service: Orthopedics;  Laterality: Right;  .  TUBAL LIGATION    . TYMPANOMASTOIDECTOMY Left 04/02/2016   Procedure: LEFT CANAL WALL DOWN TYMPANOMASTOIDECTOMY;  Surgeon: Leta Baptist, MD;  Location: [place];  Service: ENT;  Laterality: Left;  [name] VAGINAL HYSTERECTOMY  1990  . WOUND EXPLORATION Right 07/02/2017   Procedure: RIGHT NECK EXPLORATION WITH INTRAOPERATIVE ULTRASOUND;  Surgeon: Angelia Mould, MD;  Location: [facility] OR;  Service: Vascular;  Laterality: Right;    Family History  Problem Relation Age of Onset  . Heart disease Mother   . Diabetes Mother   . Heart disease Brother   . Cancer Sister   . Heart disease Brother   . Heart disease Brother   . Heart disease Son      Social History   Socioeconomic History  . Marital status: Widowed    Spouse name: Not on file  . Number of children: Not on file  . Years of education: Not on file  . Highest education level: Not on file  Occupational History  . Not on file  Tobacco Use  . Smoking status: Never Smoker  . Smokeless tobacco: Never Used  Vaping Use  . Vaping Use: Never used  Substance and Sexual Activity  . Alcohol use: No    Alcohol/week: 0.0 standard drinks  . Drug use: No  . Sexual activity: Not on file  Other Topics Concern  . Not on file  Social History Narrative  . Not on file   Social Determinants of Health   Financial Resource Strain: Not on file  Food Insecurity: Not on file  Transportation Needs: Not on file  Physical Activity: Not on file  Stress: Not on file  Social Connections: Not on file  Intimate Partner Violence: Not on file    Outpatient Medications Prior to Visit  Medication Sig Dispense Refill  . Acetaminophen (TYLENOL ARTHRITIS EXT RELIEF PO) Take 650 mg by mouth daily.     [name] albuterol (VENTOLIN HFA) 108 (90 Base) MCG/ACT inhaler Inhale 2 puffs into the lungs every 4 (four) hours as needed for shortness of breath.    [name] aspirin EC 81 MG tablet Take 1 tablet (81 mg total) by mouth daily.    . carvedilol (COREG) 3.125 MG tablet Take 3.125 mg by mouth 2 (two) times daily with a meal.    . cetirizine (ZYRTEC) 10 MG tablet Take 1 tablet (10 mg total) by mouth daily. 90 tablet 2  . cholecalciferol (VITAMIN D) 1000 UNITS tablet Take 1,000 Units by mouth daily.    . DULoxetine (CYMBALTA) 30 MG capsule Take 30 mg by mouth daily.    . furosemide (LASIX) 40 MG tablet Take 40 mg by mouth 2 (two) times daily.     [name] gabapentin (NEURONTIN) 300 MG capsule Take 300 mg by mouth at bedtime.    [name] lactulose (CHRONULAC) 10 GM/15ML solution Take 30 mLs (20 g total) by mouth 3 (three) times daily. 1892 mL 5  . lamoTRIgine (LAMICTAL) 200 MG tablet Take 200 mg by mouth 2 (two) times  daily.    [name] LORazepam (ATIVAN) 0.5 MG tablet Take 0.5 mg by mouth 2 (two) times daily as needed.    . Methylcobalamin (B12-ACTIVE PO) daily.    . ondansetron (ZOFRAN) 4 MG tablet Take 4 mg by mouth every 8 (eight) hours as needed for nausea or vomiting.    . Oxycodone HCl 10 MG TABS Take 10 mg by mouth 3 (three) times daily as needed.    . pantoprazole (PROTONIX) 40 MG tablet  Take 1 tablet (40 mg total) by mouth daily before breakfast. 90 tablet 3  . Pediatric Multivitamins-Iron (FLINTSTONES PLUS IRON) chewable tablet Chew 1 tablet by mouth 2 (two) times daily.    . potassium chloride SA (K-DUR,KLOR-CON) 20 MEQ tablet Take 20 mEq by mouth daily.      Facility-Administered Medications Prior to Visit  Medication Dose Route Frequency Provider Last Rate Last Admin  . magic mouthwash  5 mL Oral TID PRN Laurine Blazer B, PA-C        No Known Allergies  ROS Review of Systems As per HPI.    Objective:    Physical Exam Vitals and nursing note reviewed.  Constitutional:      General: She is not in acute distress.    Appearance: She is not ill-appearing, toxic-appearing or diaphoretic.  HENT:     Head: Normocephalic and atraumatic.  Cardiovascular:     Rate and Rhythm: Normal rate and regular rhythm.     Heart sounds: Normal heart sounds. No murmur heard.   Pulmonary:     Effort: Pulmonary effort is normal. No respiratory distress.     Breath sounds: Normal breath sounds.  Skin:    General: Skin is warm and dry.  Neurological:     General: No focal deficit present.     Mental Status: She is alert and oriented to person, place, and time.  Psychiatric:        Mood and Affect: Affect is tearful.        Speech: Speech normal.        Behavior: Behavior normal.     BP 134/71   Pulse 66   Temp (!) 95.7 F (35.4 C) (Temporal)   Ht 5' 4"  (1.626 m)   Wt 157 lb 8 oz (71.4 kg)   BMI 27.03 kg/m  Wt Readings from Last 3 Encounters:  04/13/20 157 lb 8 oz (71.4 kg)  03/29/20 157 lb  9.6 oz (71.5 kg)  03/28/20 156 lb 12.8 oz (71.1 kg)     Health Maintenance Due  Topic Date Due  . Hepatitis C Screening  Never done  . FOOT EXAM  Never done  . OPHTHALMOLOGY EXAM  Never done  . TETANUS/TDAP  Never done  . MAMMOGRAM  02/02/2011  . DEXA SCAN  Never done  . PNA vac Low Risk Adult (1 of 2 - PCV13) Never done    There are no preventive care reminders to display for this patient.  Lab Results  Component Value Date   TSH 0.941 03/29/2020   Lab Results  Component Value Date   WBC 12.4 (H) 03/29/2020   HGB 14.8 03/29/2020   HCT 44.3 03/29/2020   MCV 89 03/29/2020   PLT 217 03/29/2020   Lab Results  Component Value Date   NA 143 03/29/2020   K 3.5 03/29/2020   CO2 27 03/29/2020   GLUCOSE 114 (H) 03/29/2020   BUN 12 03/29/2020   CREATININE 0.91 03/29/2020   BILITOT 0.4 03/29/2020   ALKPHOS 139 (H) 03/29/2020   AST 35 03/29/2020   ALT 20 03/29/2020   PROT 7.3 03/29/2020   ALBUMIN 4.7 03/29/2020   CALCIUM 9.5 03/29/2020   ANIONGAP 12 08/15/2017   Lab Results  Component Value Date   CHOL 214 (H) 03/29/2020   Lab Results  Component Value Date   HDL 71 03/29/2020   Lab Results  Component Value Date   LDLCALC 116 (H) 03/29/2020   Lab Results  Component Value Date  TRIG 155 (H) 03/29/2020   Lab Results  Component Value Date   CHOLHDL 3.0 03/29/2020   Lab Results  Component Value Date   HGBA1C 5.7 03/29/2020      Assessment & Plan:   Nitara was seen today for depression and anxiety.  Diagnoses and all orders for this visit:  Depression, recurrent ([place]) PHQ 9 score of 9 today. No SI. Increase Crymbalta to 60 mg. Return to office for new or worsening symptoms, or if symptoms persist.  -     DULoxetine (CYMBALTA) 60 MG capsule; Take 1 capsule (60 mg total) by mouth daily.  Generalized anxiety disorder/Controlled substance agreement signed  GAD 7 score of 2 today. CSA agreement signed for refill of ativan today. Toxassure obtained today.   -     DULoxetine (CYMBALTA) 60 MG capsule; Take 1 capsule (60 mg total) by mouth daily. -     LORazepam (ATIVAN) 0.5 MG tablet; Take 1 tablet (0.5 mg total) by mouth 2 (two) times daily as needed for anxiety. -     ToxASSURE Select 13 (MW), Urine   Follow-up: Return in about 3 months (around 07/11/2020) for chronic follow up. Sooner if needed.   The patient indicates understanding of these issues and agrees with the plan.  Gwenlyn Perking, FNP

## 2020-04-18 DIAGNOSIS — H524 Presbyopia: Secondary | ICD-10-CM | POA: Diagnosis not present

## 2020-04-21 DIAGNOSIS — Z0100 Encounter for examination of eyes and vision without abnormal findings: Secondary | ICD-10-CM | POA: Diagnosis not present

## 2020-04-23 LAB — TOXASSURE SELECT 13 (MW), URINE

## 2020-04-24 ENCOUNTER — Ambulatory Visit: Payer: Medicare HMO | Admitting: Family Medicine

## 2020-04-27 ENCOUNTER — Other Ambulatory Visit: Payer: Self-pay

## 2020-04-27 ENCOUNTER — Ambulatory Visit (INDEPENDENT_AMBULATORY_CARE_PROVIDER_SITE_OTHER): Payer: Medicare HMO | Admitting: Family Medicine

## 2020-04-27 ENCOUNTER — Encounter: Payer: Self-pay | Admitting: Family Medicine

## 2020-04-27 VITALS — BP 138/78 | HR 75 | Temp 97.0°F | Ht 64.0 in | Wt 158.1 lb

## 2020-04-27 DIAGNOSIS — J0110 Acute frontal sinusitis, unspecified: Secondary | ICD-10-CM

## 2020-04-27 MED ORDER — AMOXICILLIN-POT CLAVULANATE 875-125 MG PO TABS: 1.0000 | ORAL_TABLET | Freq: Two times a day (BID) | ORAL | 0 refills | Status: AC

## 2020-04-27 NOTE — Progress Notes (Signed)
Acute Office Visit  Subjective:    [name], female    DOB: 18-Feb-1951, 70 y.o.   MRN: [id number]  Chief Complaint  Patient presents with   Nasal Congestion    HPI Patient is in today for nasal congestion for about 7 days. She has been taking mucinex without improvement. She also takes zyrtec daily. She denies fever, chills, cough, or body aches. Denies diarrhea, vomiting, or abdominal pain. Denies shortness of breath or chest pain. Her face does feel tender around her eyes.   Past Medical History:  Diagnosis Date   Asthma    Carotid artery disease ([place])    Diverticulosis of colon (without mention of hemorrhage)    Essential hypertension    Functional ovarian cysts    GERD (gastroesophageal reflux disease)    Hemorrhoids, external, without mention of complication    Hormone replacement therapy (postmenopausal)    IBS (irritable bowel syndrome)    Migraine    Mitral valve disease    Right knee DJD    Seizures ([facility])    Sleep apnea    TIA (transient ischemic attack)    Type 2 diabetes mellitus ([place])     Past Surgical History:  Procedure Laterality Date   BIOPSY  09/23/2018   Procedure: BIOPSY;  Surgeon: Rogene Houston, MD;  [facility];  Service: Endoscopy;;  duodeneum and stomach   CHOLECYSTECTOMY  1980   ENDARTERECTOMY Right 07/01/2017   Procedure: ENDARTERECTOMY CAROTID RIGHT;  Surgeon: Serafina Mitchell, MD;  [facility]: [facility] OR;  Service: Vascular;  Laterality: Right;   ESOPHAGOGASTRODUODENOSCOPY N/A 09/23/2018   Procedure: ESOPHAGOGASTRODUODENOSCOPY (EGD);  Surgeon: Rogene Houston, MD;  [facility];  Service: Endoscopy;  Laterality: N/A;  2:45-moved to 1:45 per Lelon Frohlich   LEFT HEART CATHETERIZATION WITH CORONARY ANGIOGRAM N/A 11/18/2013   Procedure: LEFT HEART CATHETERIZATION WITH CORONARY ANGIOGRAM;  Surgeon: Burnell Blanks, MD;  Location: [facility] CATH LAB;  Service: Cardiovascular;  Laterality: N/A;    OOPHORECTOMY     2012 Dr. Adah Perl in [place] Right 07/01/2017   Procedure: RIGHT CAROTID PATCH ANGIOPLASTY;  Surgeon: Serafina Mitchell, MD;  Location: [place];  Service: Vascular;  Laterality: Right;   TONSILLECTOMY     TOTAL KNEE ARTHROPLASTY Right 09/02/2012   Procedure: TOTAL KNEE ARTHROPLASTY- right;  Surgeon: Ninetta Lights, MD;  Location: [place];  Service: Orthopedics;  Laterality: Right;   TUBAL LIGATION     TYMPANOMASTOIDECTOMY Left 04/02/2016   Procedure: LEFT CANAL WALL DOWN TYMPANOMASTOIDECTOMY;  Surgeon: Leta Baptist, MD;  Location: [place];  Service: ENT;  Laterality: Left;   VAGINAL HYSTERECTOMY  1990   WOUND EXPLORATION Right 07/02/2017   Procedure: RIGHT NECK EXPLORATION WITH INTRAOPERATIVE ULTRASOUND;  Surgeon: Angelia Mould, MD;  Location: [facility] OR;  Service: Vascular;  Laterality: Right;    Family History  Problem Relation Age of Onset   Heart disease Mother    Diabetes Mother    Heart disease Brother    Cancer Sister    Heart disease Brother    Heart disease Brother    Heart disease Son     Social History   Socioeconomic History   Marital status: Widowed    Spouse name: Not on file   Number of children: Not on file   Years of education: Not on file   Highest education level: Not on file  Occupational History   Not on file  Tobacco Use  Smoking status: Never Smoker   Smokeless tobacco: Never Used  Vaping Use   Vaping Use: Never used  Substance and Sexual Activity   Alcohol use: No    Alcohol/week: 0.0 standard drinks   Drug use: No   Sexual activity: Not on file  Other Topics Concern   Not on file  Social History Narrative   Not on file   Social Determinants of Health   Financial Resource Strain: Not on file  Food Insecurity: Not on file  Transportation Needs: Not on file  Physical Activity: Not on file  Stress: Not on file  Social Connections: Not on file  Intimate Partner  Violence: Not on file    Outpatient Medications Prior to Visit  Medication Sig Dispense Refill   Acetaminophen (TYLENOL ARTHRITIS EXT RELIEF PO) Take 650 mg by mouth daily.      albuterol (VENTOLIN HFA) 108 (90 Base) MCG/ACT inhaler Inhale 2 puffs into the lungs every 4 (four) hours as needed for shortness of breath.     aspirin EC 81 MG tablet Take 1 tablet (81 mg total) by mouth daily.     carvedilol (COREG) 3.125 MG tablet Take 3.125 mg by mouth 2 (two) times daily with a meal.     cetirizine (ZYRTEC) 10 MG tablet Take 1 tablet (10 mg total) by mouth daily. 90 tablet 2   cholecalciferol (VITAMIN D) 1000 UNITS tablet Take 1,000 Units by mouth daily.     DULoxetine (CYMBALTA) 60 MG capsule Take 1 capsule (60 mg total) by mouth daily. 90 capsule 3   furosemide (LASIX) 40 MG tablet Take 40 mg by mouth 2 (two) times daily.      gabapentin (NEURONTIN) 300 MG capsule Take 300 mg by mouth at bedtime.     lactulose (CHRONULAC) 10 GM/15ML solution Take 30 mLs (20 g total) by mouth 3 (three) times daily. 1892 mL 5   lamoTRIgine (LAMICTAL) 200 MG tablet Take 200 mg by mouth 2 (two) times daily.     LORazepam (ATIVAN) 0.5 MG tablet Take 1 tablet (0.5 mg total) by mouth 2 (two) times daily as needed for anxiety. 30 tablet 2   Methylcobalamin (B12-ACTIVE PO) daily.     ondansetron (ZOFRAN) 4 MG tablet Take 4 mg by mouth every 8 (eight) hours as needed for nausea or vomiting.     Oxycodone HCl 10 MG TABS Take 10 mg by mouth 3 (three) times daily as needed.     pantoprazole (PROTONIX) 40 MG tablet Take 1 tablet (40 mg total) by mouth daily before breakfast. 90 tablet 3   Pediatric Multivitamins-Iron (FLINTSTONES PLUS IRON) chewable tablet Chew 1 tablet by mouth 2 (two) times daily.     potassium chloride SA (K-DUR,KLOR-CON) 20 MEQ tablet Take 20 mEq by mouth daily.      Facility-Administered Medications Prior to Visit  Medication Dose Route Frequency Provider Last Rate Last Admin    magic mouthwash  5 mL Oral TID PRN Laurine Blazer B, PA-C        No Known Allergies  Review of Systems As per HPI.    Objective:    Physical Exam Vitals and nursing note reviewed.  Constitutional:      General: She is not in acute distress.    Appearance: Normal appearance. She is not ill-appearing.  HENT:     Head:     Comments: Tenderness over frontal sinuses.     Nose: Congestion present.     Mouth/Throat:     Mouth:  Mucous membranes are moist.     Pharynx: Oropharynx is clear. No oropharyngeal exudate or posterior oropharyngeal erythema.  Eyes:     Conjunctiva/sclera: Conjunctivae normal.     Pupils: Pupils are equal, round, and reactive to light.  Cardiovascular:     Rate and Rhythm: Normal rate and regular rhythm.     Pulses: Normal pulses.     Heart sounds: Normal heart sounds. No murmur heard.   Pulmonary:     Effort: Pulmonary effort is normal. No respiratory distress.     Breath sounds: Normal breath sounds.  Musculoskeletal:     Cervical back: Neck supple. No tenderness.     Right lower leg: No edema.     Left lower leg: No edema.  Lymphadenopathy:     Cervical: No cervical adenopathy.  Skin:    General: Skin is warm and dry.  Neurological:     General: No focal deficit present.     Mental Status: She is alert and oriented to person, place, and time.  Psychiatric:        Mood and Affect: Mood normal.        Behavior: Behavior normal.     BP 138/78    Pulse 75    Temp (!) 97 F (36.1 C) (Temporal)    Ht 5' 4"  (1.626 m)    Wt 158 lb 2 oz (71.7 kg)    BMI 27.14 kg/m  Wt Readings from Last 3 Encounters:  04/27/20 158 lb 2 oz (71.7 kg)  04/13/20 157 lb 8 oz (71.4 kg)  03/29/20 157 lb 9.6 oz (71.5 kg)    Health Maintenance Due  Topic Date Due   Hepatitis C Screening  Never done   FOOT EXAM  Never done   OPHTHALMOLOGY EXAM  Never done   TETANUS/TDAP  Never done   MAMMOGRAM  02/02/2011   DEXA SCAN  Never done   PNA vac Low Risk Adult (1  of 2 - PCV13) Never done    There are no preventive care reminders to display for this patient.   Lab Results  Component Value Date   TSH 0.941 03/29/2020   Lab Results  Component Value Date   WBC 12.4 (H) 03/29/2020   HGB 14.8 03/29/2020   HCT 44.3 03/29/2020   MCV 89 03/29/2020   PLT 217 03/29/2020   Lab Results  Component Value Date   NA 143 03/29/2020   K 3.5 03/29/2020   CO2 27 03/29/2020   GLUCOSE 114 (H) 03/29/2020   BUN 12 03/29/2020   CREATININE 0.91 03/29/2020   BILITOT 0.4 03/29/2020   ALKPHOS 139 (H) 03/29/2020   AST 35 03/29/2020   ALT 20 03/29/2020   PROT 7.3 03/29/2020   ALBUMIN 4.7 03/29/2020   CALCIUM 9.5 03/29/2020   ANIONGAP 12 08/15/2017   Lab Results  Component Value Date   CHOL 214 (H) 03/29/2020   Lab Results  Component Value Date   HDL 71 03/29/2020   Lab Results  Component Value Date   LDLCALC 116 (H) 03/29/2020   Lab Results  Component Value Date   TRIG 155 (H) 03/29/2020   Lab Results  Component Value Date   CHOLHDL 3.0 03/29/2020   Lab Results  Component Value Date   HGBA1C 5.7 03/29/2020       Assessment & Plan:   Geri was seen today for nasal congestion.  Diagnoses and all orders for this visit:  Acute non-recurrent frontal sinusitis Will treat with augmentin given symptoms  for 7 days without improvement with mucinex and zyrtec. Return to office for new or worsening symptoms, or if symptoms persist.  -     amoxicillin-clavulanate (AUGMENTIN) 875-125 MG tablet; Take 1 tablet by mouth 2 (two) times daily for 7 days.  The patient indicates understanding of these issues and agrees with the plan.  Gwenlyn Perking, FNP

## 2020-04-27 NOTE — Patient Instructions (Signed)
Sinusitis, Adult Sinusitis is inflammation of your sinuses. Sinuses are hollow spaces in the bones around your face. Your sinuses are located:  Around your eyes.  In the middle of your forehead.  Behind your nose.  In your cheekbones. Mucus normally drains out of your sinuses. When your nasal tissues become inflamed or swollen, mucus can become trapped or blocked. This allows bacteria, viruses, and fungi to grow, which leads to infection. Most infections of the sinuses are caused by a virus. Sinusitis can develop quickly. It can last for up to 4 weeks (acute) or for more than 12 weeks (chronic). Sinusitis often develops after a cold. What are the causes? This condition is caused by anything that creates swelling in the sinuses or stops mucus from draining. This includes:  Allergies.  Asthma.  Infection from bacteria or viruses.  Deformities or blockages in your nose or sinuses.  Abnormal growths in the nose (nasal polyps).  Pollutants, such as chemicals or irritants in the air.  Infection from fungi (rare). What increases the risk? You are more likely to develop this condition if you:  Have a weak body defense system (immune system).  Do a lot of swimming or diving.  Overuse nasal sprays.  Smoke. What are the signs or symptoms? The main symptoms of this condition are pain and a feeling of pressure around the affected sinuses. Other symptoms include:  Stuffy nose or congestion.  Thick drainage from your nose.  Swelling and warmth over the affected sinuses.  Headache.  Upper toothache.  A cough that may get worse at night.  Extra mucus that collects in the throat or the back of the nose (postnasal drip).  Decreased sense of smell and taste.  Fatigue.  A fever.  Sore throat.  Bad breath. How is this diagnosed? This condition is diagnosed based on:  Your symptoms.  Your medical history.  A physical exam.  Tests to find out if your condition is  acute or chronic. This may include: ? Checking your nose for nasal polyps. ? Viewing your sinuses using a device that has a light (endoscope). ? Testing for allergies or bacteria. ? Imaging tests, such as an MRI or CT scan. In rare cases, a bone biopsy may be done to rule out more serious types of fungal sinus disease. How is this treated? Treatment for sinusitis depends on the cause and whether your condition is chronic or acute.  If caused by a virus, your symptoms should go away on their own within 10 days. You may be given medicines to relieve symptoms. They include: ? Medicines that shrink swollen nasal passages (topical intranasal decongestants). ? Medicines that treat allergies (antihistamines). ? A spray that eases inflammation of the nostrils (topical intranasal corticosteroids). ? Rinses that help get rid of thick mucus in your nose (nasal saline washes).  If caused by bacteria, your health care provider may recommend waiting to see if your symptoms improve. Most bacterial infections will get better without antibiotic medicine. You may be given antibiotics if you have: ? A severe infection. ? A weak immune system.  If caused by narrow nasal passages or nasal polyps, you may need to have surgery. Follow these instructions at home: Medicines  Take, use, or apply over-the-counter and prescription medicines only as told by your health care provider. These may include nasal sprays.  If you were prescribed an antibiotic medicine, take it as told by your health care provider. Do not stop taking the antibiotic even if you start   to feel better. Hydrate and humidify  Drink enough fluid to keep your urine pale yellow. Staying hydrated will help to thin your mucus.  Use a cool mist humidifier to keep the humidity level in your home above 50%.  Inhale steam for 10-15 minutes, 3-4 times a day, or as told by your health care provider. You can do this in the bathroom while a hot shower is  running.  Limit your exposure to cool or dry air.   Rest  Rest as much as possible.  Sleep with your head raised (elevated).  Make sure you get enough sleep each night. General instructions  Apply a warm, moist washcloth to your face 3-4 times a day or as told by your health care provider. This will help with discomfort.  Wash your hands often with soap and water to reduce your exposure to germs. If soap and water are not available, use hand sanitizer.  Do not smoke. Avoid being around people who are smoking (secondhand smoke).  Keep all follow-up visits as told by your health care provider. This is important.   Contact a health care provider if:  You have a fever.  Your symptoms get worse.  Your symptoms do not improve within 10 days. Get help right away if:  You have a severe headache.  You have persistent vomiting.  You have severe pain or swelling around your face or eyes.  You have vision problems.  You develop confusion.  Your neck is stiff.  You have trouble breathing. Summary  Sinusitis is soreness and inflammation of your sinuses. Sinuses are hollow spaces in the bones around your face.  This condition is caused by nasal tissues that become inflamed or swollen. The swelling traps or blocks the flow of mucus. This allows bacteria, viruses, and fungi to grow, which leads to infection.  If you were prescribed an antibiotic medicine, take it as told by your health care provider. Do not stop taking the antibiotic even if you start to feel better.  Keep all follow-up visits as told by your health care provider. This is important. This information is not intended to replace advice given to you by your health care provider. Make sure you discuss any questions you have with your health care provider. Document Revised: 07/07/2017 Document Reviewed: 07/07/2017 Elsevier Patient Education  2021 [employer] Inc.  

## 2020-05-03 ENCOUNTER — Telehealth: Payer: Self-pay

## 2020-05-03 MED ORDER — CARVEDILOL 3.125 MG PO TABS: 3.1250 mg | ORAL_TABLET | Freq: Two times a day (BID) | ORAL | 1 refills | Status: DC

## 2020-05-03 NOTE — Telephone Encounter (Signed)
Has not been filled by our office - is this okay to fill?

## 2020-05-03 NOTE — Telephone Encounter (Signed)
OK to refill

## 2020-05-03 NOTE — Addendum Note (Signed)
Addended by: Ladean Raya on: 05/03/2020 03:21 PM   Modules accepted: Orders

## 2020-05-03 NOTE — Telephone Encounter (Signed)
  Prescription Request  05/03/2020  What is the name of the medication or equipment? carvedilol (COREG) 3.125 MG tablet  Have you contacted your pharmacy to request a refill? (if applicable) no  Which pharmacy would you like this sent to? [facility]   Patient notified that their request is being sent to the clinical staff for review and that they should receive a response within 2 business days.

## 2020-05-12 ENCOUNTER — Telehealth: Payer: Self-pay

## 2020-05-12 DIAGNOSIS — M542 Cervicalgia: Secondary | ICD-10-CM

## 2020-05-12 NOTE — Telephone Encounter (Signed)
Patient states that she is taking her medication that you gave her for her arthritis in her neck and it eases the pain but she now has pain in the area around her left ear and a headache on the left side. Wants to know if you think this pain is connected to the neck pain she is having? She states that with the med the neck feels better but the other pain in the ear and head remain. Wants to know if you will call her some medication in. Please review and advise

## 2020-05-15 MED ORDER — DICLOFENAC SODIUM 1 % EX GEL: 2.0000 g | Freq: Four times a day (QID) | CUTANEOUS | 2 refills | Status: DC

## 2020-05-15 NOTE — Telephone Encounter (Signed)
It's possible that the headache could be caused by her neck pain. She can take tylenol for pain. She can also apply heat. If she is having any congestion, this could cause a headache, she can take mucinex if so. I have sent in voltaren gel for her neck to try.

## 2020-05-15 NOTE — Telephone Encounter (Signed)
Patient aware and expressed understanding

## 2020-05-19 ENCOUNTER — Telehealth: Payer: Self-pay

## 2020-05-19 DIAGNOSIS — F339 Major depressive disorder, recurrent, unspecified: Secondary | ICD-10-CM

## 2020-05-19 DIAGNOSIS — F411 Generalized anxiety disorder: Secondary | ICD-10-CM

## 2020-05-19 MED ORDER — LAMOTRIGINE 200 MG PO TABS: 200.0000 mg | ORAL_TABLET | Freq: Two times a day (BID) | ORAL | 0 refills | Status: DC

## 2020-05-19 MED ORDER — FUROSEMIDE 40 MG PO TABS: 40.0000 mg | ORAL_TABLET | Freq: Two times a day (BID) | ORAL | 0 refills | Status: DC

## 2020-05-19 MED ORDER — DULOXETINE HCL 60 MG PO CPEP: 60.0000 mg | ORAL_CAPSULE | Freq: Every day | ORAL | 0 refills | Status: DC

## 2020-05-19 NOTE — Telephone Encounter (Signed)
Prescriptions sent to pharmacy.  Patient aware

## 2020-05-19 NOTE — Telephone Encounter (Signed)
  Prescription Request  05/19/2020  What is the name of the medication or equipment? Furosemide 40 mg, Lamotrigine 200 mg, Duloxetine 60 mg. Has a appt in May and needs a refill. Patient states she has to have her medication.  Have you contacted your pharmacy to request a refill? (if applicable) NO  Which pharmacy would you like this sent to? [facility]   Patient notified that their request is being sent to the clinical staff for review and that they should receive a response within 2 business days.

## 2020-05-26 ENCOUNTER — Encounter: Payer: Self-pay | Admitting: Family Medicine

## 2020-05-26 ENCOUNTER — Other Ambulatory Visit: Payer: Self-pay

## 2020-05-26 ENCOUNTER — Ambulatory Visit (INDEPENDENT_AMBULATORY_CARE_PROVIDER_SITE_OTHER): Payer: Medicare HMO | Admitting: Family Medicine

## 2020-05-26 VITALS — BP 135/72 | HR 78 | Temp 95.8°F | Ht 64.0 in | Wt 157.5 lb

## 2020-05-26 DIAGNOSIS — G8929 Other chronic pain: Secondary | ICD-10-CM

## 2020-05-26 DIAGNOSIS — M542 Cervicalgia: Secondary | ICD-10-CM | POA: Diagnosis not present

## 2020-05-26 MED ORDER — BACLOFEN 10 MG PO TABS: 10.0000 mg | ORAL_TABLET | Freq: Three times a day (TID) | ORAL | 0 refills | Status: DC

## 2020-05-26 NOTE — Patient Instructions (Signed)
Arthritis Arthritis is a term that is commonly used to refer to joint pain or joint disease. There are more than 100 types of arthritis. What are the causes? The most common cause of this condition is wear and tear of a joint. Other causes include:  Gout.  Inflammation of a joint.  An infection of a joint.  Sprains and other injuries near the joint.  A reaction to medicines or drugs, or an allergic reaction. In some cases, the cause may not be known. What are the signs or symptoms? The main symptom of this condition is pain in the joint during movement. Other symptoms include:  Redness, swelling, or stiffness at a joint.  Warmth coming from the joint.  Fever.  Overall feeling of illness. How is this diagnosed? This condition may be diagnosed with a physical exam and tests, including:  Blood tests.  Urine tests.  Imaging tests, such as X-rays, an MRI, or a CT scan. Sometimes, fluid is removed from a joint for testing. How is this treated? This condition may be treated with:  Treatment of the cause, if it is known.  Rest.  Raising (elevating) the joint.  Applying cold or hot packs to the joint.  Medicines to improve symptoms and reduce inflammation.  Injections of a steroid such as cortisone into the joint to help reduce pain and inflammation. Depending on the cause of your arthritis, you may need to make lifestyle changes to reduce stress on your joint. Changes may include:  Exercising more.  Losing weight. Follow these instructions at home: Medicines  Take over-the-counter and prescription medicines only as told by your health care provider.  Do not take aspirin to relieve pain if your health care provider thinks that gout may be causing your pain. Activity  Rest your joint if told by your health care provider. Rest is important when your disease is active and your joint feels painful, swollen, or stiff.  Avoid activities that make the pain worse. It is  important to balance activity with rest.  Exercise your joint regularly with range-of-motion exercises as told by your health care provider. Try doing low-impact exercise, such as: ? Swimming. ? Water aerobics. ? Biking. ? Walking. Managing pain, stiffness, and swelling  If directed, put ice on the joint. ? Put ice in a plastic bag. ? Place a towel between your skin and the bag. ? Leave the ice on for 20 minutes, 2-3 times per day.  If your joint is swollen, raise (elevate) it above the level of your heart if directed by your health care provider.  If your joint feels stiff in the morning, try taking a warm shower.  If directed, apply heat to the affected area as often as told by your health care provider. Use the heat source that your health care provider recommends, such as a moist heat pack or a heating pad. If you have diabetes, do not apply heat without permission from your health care provider. To apply heat: ? Place a towel between your skin and the heat source. ? Leave the heat on for 20-30 minutes. ? Remove the heat if your skin turns bright red. This is especially important if you are unable to feel pain, heat, or cold. You may have a greater risk of getting burned.      General instructions  Do not use any products that contain nicotine or tobacco, such as cigarettes, e-cigarettes, and chewing tobacco. If you need help quitting, ask your health care provider.  Keep all follow-up visits as told by your health care provider. This is important. Contact a health care provider if:  The pain gets worse.  You have a fever. Get help right away if:  You develop severe joint pain, swelling, or redness.  Many joints become painful and swollen.  You develop severe back pain.  You develop severe weakness in your leg.  You cannot control your bladder or bowels. Summary  Arthritis is a term that is commonly used to refer to joint pain or joint disease. There are more than  100 types of arthritis.  The most common cause of this condition is wear and tear of a joint. Other causes include gout, inflammation or infection of the joint, sprains, or allergies.  Symptoms of this condition include redness, swelling, or stiffness of the joint. Other symptoms include warmth, fever, or feeling ill.  This condition is treated with rest, elevation, medicines, and applying cold or hot packs.  Follow your health care provider's instructions about medicines, activity, exercises, and other home care treatments. This information is not intended to replace advice given to you by your health care provider. Make sure you discuss any questions you have with your health care provider. Document Revised: 01/12/2018 Document Reviewed: 01/12/2018 Elsevier Patient Education  2021 [employer].

## 2020-05-26 NOTE — Progress Notes (Signed)
Acute Office Visit  Subjective:    [name], female    DOB: [birth year], 70 y.o.   MRN: [id number]  Chief Complaint  Patient presents with  . Neck Pain    HPI Patient is in today for chronic neck pain that has been worse for the last week. The pain is across the back of her neck. The pain is constant. She has difficulty describing the pain. Pain is a 10/10. Reports a history of arthritis in her neck. Denies erythema, swelling, or fever. Denies decreased ROM. She has tried tylenol and voltaren gel without improvement.   Past Medical History:  Diagnosis Date  . Asthma   . Carotid artery disease ([place])   . Diverticulosis of colon (without mention of hemorrhage)   . Essential hypertension   . Functional ovarian cysts   . GERD (gastroesophageal reflux disease)   . Hemorrhoids, external, without mention of complication   . Hormone replacement therapy (postmenopausal)   . IBS (irritable bowel syndrome)   . Migraine   . Mitral valve disease   . Right knee DJD   . Seizures ([place])   . Sleep apnea   . TIA (transient ischemic attack)   . Type 2 diabetes mellitus ([place])     Past Surgical History:  Procedure Laterality Date  . BIOPSY  09/23/2018   Procedure: BIOPSY;  Surgeon: Rogene Houston, MD;  Location: [facility];  Service: Endoscopy;;  duodeneum and stomach  . CHOLECYSTECTOMY  1980  . ENDARTERECTOMY Right 07/01/2017   Procedure: ENDARTERECTOMY CAROTID RIGHT;  Surgeon: Serafina Mitchell, MD;  Location: [facility] OR;  Service: Vascular;  Laterality: Right;  . ESOPHAGOGASTRODUODENOSCOPY N/A 09/23/2018   Procedure: ESOPHAGOGASTRODUODENOSCOPY (EGD);  Surgeon: Rogene Houston, MD;  Location: [facility];  Service: Endoscopy;  Laterality: N/A;  2:45-moved to 1:45 per Lelon Frohlich  . LEFT HEART CATHETERIZATION WITH CORONARY ANGIOGRAM N/A 11/18/2013   Procedure: LEFT HEART CATHETERIZATION WITH CORONARY ANGIOGRAM;  Surgeon: Burnell Blanks, MD;  Location: [facility] CATH LAB;  Service:  Cardiovascular;  Laterality: N/A;  . OOPHORECTOMY     2012 Dr. Adah Perl in [place]  . PATCH ANGIOPLASTY Right 07/01/2017   Procedure: RIGHT CAROTID PATCH ANGIOPLASTY;  Surgeon: Serafina Mitchell, MD;  Location: [facility] OR;  Service: Vascular;  Laterality: Right;  . TONSILLECTOMY    . TOTAL KNEE ARTHROPLASTY Right 09/02/2012   Procedure: TOTAL KNEE ARTHROPLASTY- right;  Surgeon: Ninetta Lights, MD;  Location: [facility];  Service: Orthopedics;  Laterality: Right;  . TUBAL LIGATION    . TYMPANOMASTOIDECTOMY Left 04/02/2016   Procedure: LEFT CANAL WALL DOWN TYMPANOMASTOIDECTOMY;  Surgeon: Leta Baptist, MD;  Location: [facility];  Service: ENT;  Laterality: Left;  [name] VAGINAL HYSTERECTOMY  1990  . WOUND EXPLORATION Right 07/02/2017   Procedure: RIGHT NECK EXPLORATION WITH INTRAOPERATIVE ULTRASOUND;  Surgeon: Angelia Mould, MD;  Location: [facility] OR;  Service: Vascular;  Laterality: Right;    Family History  Problem Relation Age of Onset  . Heart disease Mother   . Diabetes Mother   . Heart disease Brother   . Cancer Sister   . Heart disease Brother   . Heart disease Brother   . Heart disease Son     Social History   Socioeconomic History  . Marital status: Widowed    Spouse name: Not on file  . Number of children: Not on file  . Years of education: Not on file  . Highest education level: Not on  file  Occupational History  . Not on file  Tobacco Use  . Smoking status: Never Smoker  . Smokeless tobacco: Never Used  Vaping Use  . Vaping Use: Never used  Substance and Sexual Activity  . Alcohol use: No    Alcohol/week: 0.0 standard drinks  . Drug use: No  . Sexual activity: Not on file  Other Topics Concern  . Not on file  Social History Narrative  . Not on file   Social Determinants of Health   Financial Resource Strain: Not on file  Food Insecurity: Not on file  Transportation Needs: Not on file  Physical Activity: Not on file  Stress: Not on file  Social Connections:  Not on file  Intimate Partner Violence: Not on file    Outpatient Medications Prior to Visit  Medication Sig Dispense Refill  . Acetaminophen (TYLENOL ARTHRITIS EXT RELIEF PO) Take 650 mg by mouth daily.     [name] albuterol (VENTOLIN HFA) 108 (90 Base) MCG/ACT inhaler Inhale 2 puffs into the lungs every 4 (four) hours as needed for shortness of breath.    [name] aspirin EC 81 MG tablet Take 1 tablet (81 mg total) by mouth daily.    . carvedilol (COREG) 3.125 MG tablet Take 1 tablet (3.125 mg total) by mouth 2 (two) times daily with a meal. 180 tablet 1  . cetirizine (ZYRTEC) 10 MG tablet Take 1 tablet (10 mg total) by mouth daily. 90 tablet 2  . cholecalciferol (VITAMIN D) 1000 UNITS tablet Take 1,000 Units by mouth daily.    . DULoxetine (CYMBALTA) 60 MG capsule Take 1 capsule (60 mg total) by mouth daily. 30 capsule 0  . furosemide (LASIX) 40 MG tablet Take 1 tablet (40 mg total) by mouth 2 (two) times daily. 60 tablet 0  . gabapentin (NEURONTIN) 300 MG capsule Take 300 mg by mouth at bedtime.    [name] lactulose (CHRONULAC) 10 GM/15ML solution Take 30 mLs (20 g total) by mouth 3 (three) times daily. 1892 mL 5  . lamoTRIgine (LAMICTAL) 200 MG tablet Take 1 tablet (200 mg total) by mouth 2 (two) times daily. 60 tablet 0  . LORazepam (ATIVAN) 0.5 MG tablet Take 1 tablet (0.5 mg total) by mouth 2 (two) times daily as needed for anxiety. 30 tablet 2  . Methylcobalamin (B12-ACTIVE PO) daily.    . ondansetron (ZOFRAN) 4 MG tablet Take 4 mg by mouth every 8 (eight) hours as needed for nausea or vomiting.    . Oxycodone HCl 10 MG TABS Take 10 mg by mouth 3 (three) times daily as needed.    . pantoprazole (PROTONIX) 40 MG tablet Take 1 tablet (40 mg total) by mouth daily before breakfast. 90 tablet 3  . Pediatric Multivitamins-Iron (FLINTSTONES PLUS IRON) chewable tablet Chew 1 tablet by mouth 2 (two) times daily.    . potassium chloride SA (K-DUR,KLOR-CON) 20 MEQ tablet Take 20 mEq by mouth daily.     .  diclofenac Sodium (VOLTAREN) 1 % GEL Apply 2 g topically 4 (four) times daily. 100 g 2   Facility-Administered Medications Prior to Visit  Medication Dose Route Frequency Provider Last Rate Last Admin  . magic mouthwash  5 mL Oral TID PRN Laurine Blazer B, PA-C        No Known Allergies  Review of Systems As per HPI.     Objective:    Physical Exam Vitals and nursing note reviewed.  Constitutional:      General: She is not in  acute distress.    Appearance: Normal appearance. She is not ill-appearing, toxic-appearing or diaphoretic.  HENT:     Head: Normocephalic and atraumatic.  Cardiovascular:     Rate and Rhythm: Normal rate and regular rhythm.     Pulses: Normal pulses.     Heart sounds: Normal heart sounds. No murmur heard.   Pulmonary:     Effort: Pulmonary effort is normal. No respiratory distress.     Breath sounds: Normal breath sounds.  Musculoskeletal:     Cervical back: Normal range of motion. No tenderness.     Left lower leg: No edema.  Skin:    General: Skin is warm and dry.  Neurological:     General: No focal deficit present.     Mental Status: She is alert and oriented to person, place, and time.  Psychiatric:        Mood and Affect: Mood normal.        Behavior: Behavior normal.     BP 135/72   Pulse 78   Temp (!) 95.8 F (35.4 C) (Temporal)   Ht 5' 4"  (1.626 m)   Wt 157 lb 8 oz (71.4 kg)   BMI 27.03 kg/m  Wt Readings from Last 3 Encounters:  05/26/20 157 lb 8 oz (71.4 kg)  04/27/20 158 lb 2 oz (71.7 kg)  04/13/20 157 lb 8 oz (71.4 kg)    Health Maintenance Due  Topic Date Due  . Hepatitis C Screening  Never done  . FOOT EXAM  Never done  . OPHTHALMOLOGY EXAM  Never done  . MAMMOGRAM  02/02/2011  . DEXA SCAN  Never done  . PNA vac Low Risk Adult (1 of 2 - PCV13) Never done    There are no preventive care reminders to display for this patient.   Lab Results  Component Value Date   TSH 0.941 03/29/2020   Lab Results   Component Value Date   WBC 12.4 (H) 03/29/2020   HGB 14.8 03/29/2020   HCT 44.3 03/29/2020   MCV 89 03/29/2020   PLT 217 03/29/2020   Lab Results  Component Value Date   NA 143 03/29/2020   K 3.5 03/29/2020   CO2 27 03/29/2020   GLUCOSE 114 (H) 03/29/2020   BUN 12 03/29/2020   CREATININE 0.91 03/29/2020   BILITOT 0.4 03/29/2020   ALKPHOS 139 (H) 03/29/2020   AST 35 03/29/2020   ALT 20 03/29/2020   PROT 7.3 03/29/2020   ALBUMIN 4.7 03/29/2020   CALCIUM 9.5 03/29/2020   ANIONGAP 12 08/15/2017   Lab Results  Component Value Date   CHOL 214 (H) 03/29/2020   Lab Results  Component Value Date   HDL 71 03/29/2020   Lab Results  Component Value Date   LDLCALC 116 (H) 03/29/2020   Lab Results  Component Value Date   TRIG 155 (H) 03/29/2020   Lab Results  Component Value Date   CHOLHDL 3.0 03/29/2020   Lab Results  Component Value Date   HGBA1C 5.7 03/29/2020       Assessment & Plan:   Arneshia was seen today for neck pain.  Diagnoses and all orders for this visit:  Chronic neck pain Arthritis in neck noted on CT from 2019. No red flags, exam benign. Try baclofen as below. Referral to PT. Return to office for new or worsening symptoms. -     baclofen (LIORESAL) 10 MG tablet; Take 1 tablet (10 mg total) by mouth 3 (three) times daily. -  Ambulatory referral to Physical Therapy  The patient indicates understanding of these issues and agrees with the plan.  Gwenlyn Perking, FNP

## 2020-06-05 ENCOUNTER — Telehealth: Payer: Self-pay

## 2020-06-05 ENCOUNTER — Ambulatory Visit (INDEPENDENT_AMBULATORY_CARE_PROVIDER_SITE_OTHER): Payer: Medicare HMO | Admitting: Family Medicine

## 2020-06-05 ENCOUNTER — Encounter: Payer: Self-pay | Admitting: Family Medicine

## 2020-06-05 DIAGNOSIS — A084 Viral intestinal infection, unspecified: Secondary | ICD-10-CM

## 2020-06-05 MED ORDER — LOPERAMIDE HCL 2 MG PO CAPS: ORAL_CAPSULE | ORAL | 0 refills | Status: DC

## 2020-06-05 MED ORDER — ONDANSETRON HCL 4 MG PO TABS: 4.0000 mg | ORAL_TABLET | Freq: Three times a day (TID) | ORAL | 0 refills | Status: DC | PRN

## 2020-06-05 NOTE — Addendum Note (Signed)
Addended by: Gwenlyn Perking on: 06/05/2020 03:03 PM   Modules accepted: Level of Service

## 2020-06-05 NOTE — Progress Notes (Signed)
   Virtual Visit  Note Due to COVID-19 pandemic this visit was conducted virtually. This visit type was conducted due to national recommendations for restrictions regarding the COVID-19 Pandemic (e.g. social distancing, sheltering in place) in an effort to limit this patient's exposure and mitigate transmission in our community. All issues noted in this document were discussed and addressed.  A physical exam was not performed with this format.  I connected with Carol Richmond on 06/05/20 at 1040 by telephone and verified that I am speaking with the correct person using two identifiers. Carol Richmond is currently located at home and no one is currently with her during the visit. The provider, Gwenlyn Perking, FNP is located in their office at time of visit.  I discussed the limitations, risks, security and privacy concerns of performing an evaluation and management service by telephone and the availability of in person appointments. I also discussed with the patient that there may be a patient responsible charge related to this service. The patient expressed understanding and agreed to proceed.  CC: diarrhea  History and Present Illness:  HPI  Carol Richmond reports diarrhea x2 days. She reports "too many episodes to count" of watery diarrhea. She did have some abdominal cramping and nausea yesterday, although these symptoms seem to have resolved today. She denies fever, chills, or vomiting. She has been taking imodium, but she is almost out. She has taken 2 tablets this morning. She reports fewer episodes of diarrhea today compared to yesterday. She has been staying well hydrated with water and gatorade. She has been able to eat as well.     ROS As per HPI.    Observations/Objective: Alert and oriented x 3. Able to speak in full sentences without difficulty.    Assessment and Plan: [name] was seen today for diarrhea.  Diagnoses and all orders for this visit:  Viral  gastroenteritis Zofran refilled. Imodium sent in. Discussed proper dosing for imodium. Stay well hydrated, bland diet and advance as tolerated. Return to office for new or worsening symptoms, or if symptoms persist.  -     ondansetron (ZOFRAN) 4 MG tablet; Take 1 tablet (4 mg total) by mouth every 8 (eight) hours as needed for nausea or vomiting. -     loperamide (IMODIUM) 2 MG capsule; Take 2 tablets after the first loose stool, then 1 tablet after each loose stool. Max of 8 tablets per day.     Follow Up Instructions: As needed.     I discussed the assessment and treatment plan with the patient. The patient was provided an opportunity to ask questions and all were answered. The patient agreed with the plan and demonstrated an understanding of the instructions.   The patient was advised to call back or seek an in-person evaluation if the symptoms worsen or if the condition fails to improve as anticipated.  The above assessment and management plan was discussed with the patient. The patient verbalized understanding of and has agreed to the management plan. Patient is aware to call the clinic if symptoms persist or worsen. Patient is aware when to return to the clinic for a follow-up visit. Patient educated on when it is appropriate to go to the emergency department.   Time call ended:  1052  I provided 12 minutes of  non face-to-face time during this encounter.    Gwenlyn Perking, FNP

## 2020-06-05 NOTE — Telephone Encounter (Signed)
Patient aware and verbalized understanding. °

## 2020-06-05 NOTE — Telephone Encounter (Signed)
Appointment scheduled per request.

## 2020-06-05 NOTE — Telephone Encounter (Signed)
Yes that is fine

## 2020-06-19 ENCOUNTER — Other Ambulatory Visit: Payer: Self-pay

## 2020-06-19 ENCOUNTER — Encounter: Payer: Self-pay | Admitting: Family Medicine

## 2020-06-19 ENCOUNTER — Ambulatory Visit (INDEPENDENT_AMBULATORY_CARE_PROVIDER_SITE_OTHER): Payer: Medicare HMO | Admitting: Family Medicine

## 2020-06-19 VITALS — BP 127/82 | HR 90 | Temp 95.1°F | Ht 64.0 in | Wt 158.0 lb

## 2020-06-19 DIAGNOSIS — G8929 Other chronic pain: Secondary | ICD-10-CM

## 2020-06-19 DIAGNOSIS — M542 Cervicalgia: Secondary | ICD-10-CM

## 2020-06-19 DIAGNOSIS — R062 Wheezing: Secondary | ICD-10-CM | POA: Diagnosis not present

## 2020-06-19 MED ORDER — BACLOFEN 10 MG PO TABS: 10.0000 mg | ORAL_TABLET | Freq: Three times a day (TID) | ORAL | 2 refills | Status: DC

## 2020-06-19 MED ORDER — ALBUTEROL SULFATE HFA 108 (90 BASE) MCG/ACT IN AERS: 2.0000 | INHALATION_SPRAY | RESPIRATORY_TRACT | 11 refills | Status: DC | PRN

## 2020-06-19 MED ORDER — METHYLPREDNISOLONE ACETATE 40 MG/ML IJ SUSP
80.0000 mg | Freq: Once | INTRAMUSCULAR | Status: AC
  Administered 2020-06-19: 80 mg via INTRAMUSCULAR

## 2020-06-19 NOTE — Patient Instructions (Addendum)
Call [facility] at [phone]  Bronchospasm, Adult  Bronchospasm is a tightening of the smooth muscle that wraps around the small airways in the lungs. When the muscle tightens, the small airways narrow. Narrowed airways limit the air you breathe in or out of your lungs. Inflammation (swelling) and more mucus (sputum) than usual can further irritate the airways. This can make it very hard to breathe. Bronchospasm can happen suddenly or over a period of time. What are the causes? Common causes of this condition include:  An infection, such as a cold or sinus drainage.  Exercise.  Strong odors from aerosol sprays, and fumes from perfume, candles, and household cleaners.  Cold air.  Stress or strong emotions such as crying or laughing. What increases the risk? The following factors may make you more likely to develop this condition:  Having asthma.  Smoking or being around someone who smokes (secondhand smoke).  Seasonal allergies, such as pollen or mold.  Allergic reaction (anaphylaxis) to food, medicine, or insect bites or stings. What are the signs or symptoms? Symptoms of this condition include:  Making a whistling sound when you breathe (wheezing).  Coughing.  Chest tightness.  Shortness of breath.  Decreased ability to exercise.  Noisy breathing or a high-pitched cough. How is this diagnosed? This condition may be diagnosed based on your medical history and a physical exam. Your health care provider may also perform tests, including:  A chest X-ray.  Lung function tests. How is this treated? This condition may be treated by:  Using inhaled medicines. These open up (relax) the airways and help you breathe. They can be taken with a metered dose inhaler or a nebulizer device.  Taking corticosteroid medicines. These may be given to reduce inflammation and swelling.  Removing the irritant or trigger that started the bronchospasm.   Follow these  instructions at home: Medicines  Take over-the-counter and prescription medicines only as told by your health care provider.  If you need to use an inhaler or nebulizer to take your medicine, ask your health care provider how to use it correctly.  You may be given a spacer to use with your inhaler. This makes it easier to get the medicine from the inhaler into your lungs. Lifestyle  Do not use any products that contain nicotine or tobacco, such as cigarettes, e-cigarettes, and chewing tobacco. If you need help quitting, ask your health care provider.  Keep track of things that trigger your bronchospasm. Avoid these if possible.  When pollen, air pollution, or humidity levels are bad, keep windows closed and use an air conditioner or go to places that have air conditioning.  Find ways to manage stress and your emotions, such as mindfulness, relaxation, or breathing exercises. Activity Some people have bronchospasm when they exercise. This is called exercise-induced bronchoconstriction (EIB). If you have this problem, talk with your health care provider about how to manage EIB. Some tips include:  Use your fast-acting inhaler before exercise.  Exercise indoors if it is very cold, humid, or if the pollen and mold counts are high.  Warm up and cool down before and after exercise.  Stop exercising right away if your symptoms start or get worse. General instructions  If you have asthma, make sure you have an asthma action plan.  Stay up to date on your immunizations.  Keep all follow-up visits as told by your health care provider. This is important. Get help right away if:  You have trouble breathing.  Your wheezing and coughing do not get better after taking your medicine.  You have chest pain.  You have trouble speaking more than one-word sentences. These symptoms may represent a serious problem that is an emergency. Do not wait to see if the symptoms will go away. Get medical  help right away. Call your local emergency services (911 in the [place] Do not drive yourself to the hospital. Summary  Bronchospasm is a tightening of the smooth muscle that wraps around the small airways in the lungs.  Some people have bronchospasm when they exercise. This is called exercise-induced bronchoconstriction (EIB). If you have this problem, talk with your health care provider about how to manage EIB.  Do not use any products that contain nicotine or tobacco, such as cigarettes, e-cigarettes, and chewing tobacco.  Get help right away if your wheezing and coughing do not get better after taking your medicine. This information is not intended to replace advice given to you by your health care provider. Make sure you discuss any questions you have with your health care provider. Document Revised: 03/16/2019 Document Reviewed: 03/16/2019 Elsevier Patient Education  2021 [employer].

## 2020-06-19 NOTE — Addendum Note (Signed)
Addended by: Gwenlyn Perking on: 06/19/2020 04:09 PM   Modules accepted: Level of Service

## 2020-06-19 NOTE — Progress Notes (Signed)
Acute Office Visit  Subjective:    [name], female    DOB: 1950/04/30, 70 y.o.   MRN: [id number]  Chief Complaint  Patient presents with  . Neck Pain    HPI Patient is in today for chronic neck pain. The pain is across the back of her neck. The pain is constant. She has difficulty describing the pain. Pain is a 10/10. Reports a history of arthritis in her neck. Denies erythema, swelling, or fever. Denies decreased ROM. She has tried tylenol and voltaren gel without improvement. She is taking oxycodone as well. She has not scheduled a PT appointment yet. She is out of bacoflen and would like a refill.   She is seeing ENT next week. She also report wheezing and a cough since Sunday. She has not used her albuterol inhaler. Denies shortness of breath. She has been taking mucinex. Denies fever.   Past Medical History:  Diagnosis Date  . Asthma   . Carotid artery disease (HCC)   . Diverticulosis of colon (without mention of hemorrhage)   . Essential hypertension   . Functional ovarian cysts   . GERD (gastroesophageal reflux disease)   . Hemorrhoids, external, without mention of complication   . Hormone replacement therapy (postmenopausal)   . IBS (irritable bowel syndrome)   . Migraine   . Mitral valve disease   . Right knee DJD   . Seizures (HCC)   . Sleep apnea   . TIA (transient ischemic attack)   . Type 2 diabetes mellitus (HCC)     Past Surgical History:  Procedure Laterality Date  . BIOPSY  09/23/2018   Procedure: BIOPSY;  Surgeon: Rehman, Najeeb U, MD;  Location: [facility];  Service: Endoscopy;;  duodeneum and stomach  . CHOLECYSTECTOMY  1980  . ENDARTERECTOMY Right 07/01/2017   Procedure: ENDARTERECTOMY CAROTID RIGHT;  Surgeon: Brabham, Vance W, MD;  Location: [facility] OR;  Service: Vascular;  Laterality: Right;  . ESOPHAGOGASTRODUODENOSCOPY N/A 09/23/2018   Procedure: ESOPHAGOGASTRODUODENOSCOPY (EGD);  Surgeon: Rehman, Najeeb U, MD;  Location: [facility];  Service: Endoscopy;  Laterality: N/A;  2:45-moved to 1:45 per Ann  . LEFT HEART CATHETERIZATION WITH CORONARY ANGIOGRAM N/A 11/18/2013   Procedure: LEFT HEART CATHETERIZATION WITH CORONARY ANGIOGRAM;  Surgeon: Christopher D McAlhany, MD;  Location: [facility];  Service: Cardiovascular;  Laterality: N/A;  . OOPHORECTOMY     20 12 Dr. Adah Perl in [place]  . PATCH ANGIOPLASTY Right 07/01/2017   Procedure: RIGHT CAROTID PATCH ANGIOPLASTY;  Surgeon: Serafina Mitchell, MD;  Location: [facility] OR;  Service: Vascular;  Laterality: Right;  . TONSILLECTOMY    . TOTAL KNEE ARTHROPLASTY Right 09/02/2012   Procedure: TOTAL KNEE ARTHROPLASTY- right;  Surgeon: Ninetta Lights, MD;  Location: [facility];  Service: Orthopedics;  Laterality: Right;  . TUBAL LIGATION    . TYMPANOMASTOIDECTOMY Left 04/02/2016   Procedure: LEFT CANAL WALL DOWN TYMPANOMASTOIDECTOMY;  Surgeon: Leta Baptist, MD;  Location: [facility];  Service: ENT;  Laterality: Left;  [name] VAGINAL HYSTERECTOMY  1990  . WOUND EXPLORATION Right 07/02/2017   Procedure: RIGHT NECK EXPLORATION WITH INTRAOPERATIVE ULTRASOUND;  Surgeon: Angelia Mould, MD;  Location: [facility];  Service: Vascular;  Laterality: Right;    Family History  Problem Relation Age of Onset  . Heart disease Mother   . Diabetes Mother   . Heart disease Brother   . Cancer Sister   . Heart disease Brother   . Heart disease Brother   .  Heart disease Son     Social History   Socioeconomic History  . Marital status: Widowed    Spouse name: Not on file  . Number of children: Not on file  . Years of education: Not on file  . Highest education level: Not on file  Occupational History  . Not on file  Tobacco Use  . Smoking status: Never Smoker  . Smokeless tobacco: Never Used  Vaping Use  . Vaping Use: Never used  Substance and Sexual Activity  . Alcohol use: No    Alcohol/week: 0.0 standard drinks  . Drug use: No  . Sexual activity: Not on file  Other Topics Concern   . Not on file  Social History Narrative  . Not on file   Social Determinants of Health   Financial Resource Strain: Not on file  Food Insecurity: Not on file  Transportation Needs: Not on file  Physical Activity: Not on file  Stress: Not on file  Social Connections: Not on file  Intimate Partner Violence: Not on file    Outpatient Medications Prior to Visit  Medication Sig Dispense Refill  . Acetaminophen (TYLENOL ARTHRITIS EXT RELIEF PO) Take 650 mg by mouth daily.     [name] albuterol (VENTOLIN HFA) 108 (90 Base) MCG/ACT inhaler Inhale 2 puffs into the lungs every 4 (four) hours as needed for shortness of breath.    [name] aspirin EC 81 MG tablet Take 1 tablet (81 mg total) by mouth daily.    . carvedilol (COREG) 3.125 MG tablet Take 1 tablet (3.125 mg total) by mouth 2 (two) times daily with a meal. 180 tablet 1  . cetirizine (ZYRTEC) 10 MG tablet Take 1 tablet (10 mg total) by mouth daily. 90 tablet 2  . cholecalciferol (VITAMIN D) 1000 UNITS tablet Take 1,000 Units by mouth daily.    . DULoxetine (CYMBALTA) 60 MG capsule Take 1 capsule (60 mg total) by mouth daily. 30 capsule 0  . furosemide (LASIX) 40 MG tablet Take 1 tablet (40 mg total) by mouth 2 (two) times daily. 60 tablet 0  . gabapentin (NEURONTIN) 300 MG capsule Take 300 mg by mouth at bedtime.    [name] lactulose (CHRONULAC) 10 GM/15ML solution Take 30 mLs (20 g total) by mouth 3 (three) times daily. 1892 mL 5  . lamoTRIgine (LAMICTAL) 200 MG tablet Take 1 tablet (200 mg total) by mouth 2 (two) times daily. 60 tablet 0  . loperamide (IMODIUM) 2 MG capsule Take 2 tablets after the first loose stool, then 1 tablet after each loose stool. Max of 8 tablets per day. 30 capsule 0  . LORazepam (ATIVAN) 0.5 MG tablet Take 1 tablet (0.5 mg total) by mouth 2 (two) times daily as needed for anxiety. 30 tablet 2  . Methylcobalamin (B12-ACTIVE PO) daily.    . ondansetron (ZOFRAN) 4 MG tablet Take 1 tablet (4 mg total) by mouth every 8 (eight)  hours as needed for nausea or vomiting. 20 tablet 0  . Oxycodone HCl 10 MG TABS Take 10 mg by mouth 3 (three) times daily as needed.    . pantoprazole (PROTONIX) 40 MG tablet Take 1 tablet (40 mg total) by mouth daily before breakfast. 90 tablet 3  . Pediatric Multivitamins-Iron (FLINTSTONES PLUS IRON) chewable tablet Chew 1 tablet by mouth 2 (two) times daily.    . potassium chloride SA (K-DUR,KLOR-CON) 20 MEQ tablet Take 20 mEq by mouth daily.     . baclofen (LIORESAL) 10 MG tablet Take 1 tablet (  10 mg total) by mouth 3 (three) times daily. (Patient not taking: Reported on 06/19/2020) 30 each 0   Facility-Administered Medications Prior to Visit  Medication Dose Route Frequency Provider Last Rate Last Admin  . magic mouthwash  5 mL Oral TID PRN Laurine Blazer B, PA-C        No Known Allergies  Review of Systems As per HPI.     Objective:    Physical Exam Vitals and nursing note reviewed.  Constitutional:      General: She is not in acute distress.    Appearance: She is not ill-appearing, toxic-appearing or diaphoretic.  HENT:     Head: Normocephalic and atraumatic.     Right Ear: Tympanic membrane, ear canal and external ear normal.     Left Ear: Tympanic membrane, ear canal and external ear normal.     Nose: Nose normal.     Mouth/Throat:     Mouth: Mucous membranes are moist.     Pharynx: Oropharynx is clear. No oropharyngeal exudate or posterior oropharyngeal erythema.  Eyes:     Conjunctiva/sclera: Conjunctivae normal.     Pupils: Pupils are equal, round, and reactive to light.  Cardiovascular:     Rate and Rhythm: Normal rate and regular rhythm.  Pulmonary:     Effort: Pulmonary effort is normal.     Breath sounds: Wheezing (expiratory) present. No rhonchi or rales.  Chest:     Chest wall: No tenderness.  Musculoskeletal:     Cervical back: No rigidity.  Lymphadenopathy:     Cervical: No cervical adenopathy.  Skin:    General: Skin is warm and dry.  Neurological:      General: No focal deficit present.     Mental Status: She is alert and oriented to person, place, and time.  Psychiatric:        Mood and Affect: Mood normal.     BP 127/82   Pulse 90   Temp (!) 95.1 F (35.1 C) (Temporal)   Ht 5' 4"  (1.626 m)   Wt 158 lb (71.7 kg)   BMI 27.12 kg/m  Wt Readings from Last 3 Encounters:  06/19/20 158 lb (71.7 kg)  05/26/20 157 lb 8 oz (71.4 kg)  04/27/20 158 lb 2 oz (71.7 kg)    Health Maintenance Due  Topic Date Due  . Hepatitis C Screening  Never done  . FOOT EXAM  Never done  . OPHTHALMOLOGY EXAM  Never done  . MAMMOGRAM  02/02/2011  . DEXA SCAN  Never done  . PNA vac Low Risk Adult (1 of 2 - PCV13) Never done    There are no preventive care reminders to display for this patient.   Lab Results  Component Value Date   TSH 0.941 03/29/2020   Lab Results  Component Value Date   WBC 12.4 (H) 03/29/2020   HGB 14.8 03/29/2020   HCT 44.3 03/29/2020   MCV 89 03/29/2020   PLT 217 03/29/2020   Lab Results  Component Value Date   NA 143 03/29/2020   K 3.5 03/29/2020   CO2 27 03/29/2020   GLUCOSE 114 (H) 03/29/2020   BUN 12 03/29/2020   CREATININE 0.91 03/29/2020   BILITOT 0.4 03/29/2020   ALKPHOS 139 (H) 03/29/2020   AST 35 03/29/2020   ALT 20 03/29/2020   PROT 7.3 03/29/2020   ALBUMIN 4.7 03/29/2020   CALCIUM 9.5 03/29/2020   ANIONGAP 12 08/15/2017   Lab Results  Component Value Date   CHOL  214 (H) 03/29/2020   Lab Results  Component Value Date   HDL 71 03/29/2020   Lab Results  Component Value Date   LDLCALC 116 (H) 03/29/2020   Lab Results  Component Value Date   TRIG 155 (H) 03/29/2020   Lab Results  Component Value Date   CHOLHDL 3.0 03/29/2020   Lab Results  Component Value Date   HGBA1C 5.7 03/29/2020       Assessment & Plan:   Carol Richmond was seen today for neck pain.  Diagnoses and all orders for this visit:  Chronic neck pain Refill provided for baclofen. Patient is on opiate therapy.  Provided contact info to schedule PT appointment.  -     baclofen (LIORESAL) 10 MG tablet; Take 1 tablet (10 mg total) by mouth 3 (three) times daily.  Wheezing Refill on albuterol inhaler. Steroid IM injection today in office. Continue mucinex as needed.  -     albuterol (VENTOLIN HFA) 108 (90 Base) MCG/ACT inhaler; Inhale 2 puffs into the lungs every 4 (four) hours as needed for shortness of breath. -     methylPREDNISolone acetate (DEPO-MEDROL) injection 80 mg  Return to office for new or worsening symptoms, or if symptoms persist.   The patient indicates understanding of these issues and agrees with the plan.   Gwenlyn Perking, FNP

## 2020-06-21 ENCOUNTER — Telehealth: Payer: Self-pay

## 2020-06-21 ENCOUNTER — Ambulatory Visit (INDEPENDENT_AMBULATORY_CARE_PROVIDER_SITE_OTHER): Payer: Medicare HMO

## 2020-06-21 ENCOUNTER — Telehealth: Payer: Self-pay | Admitting: *Deleted

## 2020-06-21 DIAGNOSIS — I38 Endocarditis, valve unspecified: Secondary | ICD-10-CM

## 2020-06-21 DIAGNOSIS — Z79899 Other long term (current) drug therapy: Secondary | ICD-10-CM

## 2020-06-21 DIAGNOSIS — F339 Major depressive disorder, recurrent, unspecified: Secondary | ICD-10-CM

## 2020-06-21 DIAGNOSIS — F411 Generalized anxiety disorder: Secondary | ICD-10-CM

## 2020-06-21 DIAGNOSIS — I059 Rheumatic mitral valve disease, unspecified: Secondary | ICD-10-CM | POA: Diagnosis not present

## 2020-06-21 DIAGNOSIS — I10 Essential (primary) hypertension: Secondary | ICD-10-CM

## 2020-06-21 LAB — ECHOCARDIOGRAM COMPLETE
AV Vena cont: 0.36 cm
Area-P 1/2: 1.72 cm2
Calc EF: 39 %
MV M vel: 5.31 m/s
MV Peak grad: 112.8 mmHg
MV VTI: 0.89 cm2
P 1/2 time: 790 msec
Radius: 0.37 cm
S' Lateral: 3.8 cm
Single Plane A2C EF: 47.8 %
Single Plane A4C EF: 28.8 %

## 2020-06-21 MED ORDER — ENTRESTO 24-26 MG PO TABS: 1.0000 | ORAL_TABLET | Freq: Two times a day (BID) | ORAL | 6 refills | Status: DC

## 2020-06-21 MED ORDER — LAMOTRIGINE 200 MG PO TABS: 200.0000 mg | ORAL_TABLET | Freq: Two times a day (BID) | ORAL | 2 refills | Status: DC

## 2020-06-21 MED ORDER — DULOXETINE HCL 60 MG PO CPEP: 60.0000 mg | ORAL_CAPSULE | Freq: Every day | ORAL | 2 refills | Status: DC

## 2020-06-21 NOTE — Telephone Encounter (Signed)
  Prescription Request  06/21/2020  What is the name of the medication or equipment? DULoxetine (CYMBALTA) 60 MG capsule   lamoTRIgine (LAMICTAL) 200 MG tablet   Have you contacted your pharmacy to request a refill? (if applicable) yes  Which pharmacy would you like this sent to? [facility]   Patient notified that their request is being sent to the clinical staff for review and that they should receive a response within 2 business days.

## 2020-06-21 NOTE — Telephone Encounter (Signed)
-----   Message from Satira Sark, MD sent at 06/21/2020 12:43 PM EDT ----- Results reviewed.  LVEF is mildly decreased at 45 to 50%.  Mitral valve is moderately stenosed and associated with moderate mitral regurgitation.  Patient seen recently in office.  Suggest starting Entresto 24/26 mg twice daily, follow-up BMET in 7-10 days.

## 2020-06-21 NOTE — Telephone Encounter (Signed)
Patient aware.

## 2020-06-21 NOTE — Telephone Encounter (Signed)
Patient informed and verbalized understanding of plan. 30 day voucher information sent with entresto rx Will get lab order at office visit Friday. Will have lab work done at [facility]

## 2020-06-21 NOTE — Telephone Encounter (Signed)
Alka-Seltzer Sinus apparently has tylenol in it. She should switch to Mucinex or another OTC medication for congestion that does not have tylenol or acetaminophen in it. Her liver enzymes are in the normal range, so a few days of the Alka-Seltzer Sinus that she took is likely fine. Refill approved for lamictal.

## 2020-06-21 NOTE — Telephone Encounter (Signed)
Patient said you had told her to try Alka-Seltzer Sinus gel caps and she started a couple of days ago and is feeling better.  Her concern is there is a warning on the box that instructs not to take if you have liver disease and patient reports she does and is not sure if she should be taking.  Would like your opinion.  Also, she needs a refill on her Lamictal.

## 2020-06-22 DIAGNOSIS — H95122 Granulation of postmastoidectomy cavity, left ear: Secondary | ICD-10-CM | POA: Diagnosis not present

## 2020-06-22 DIAGNOSIS — R070 Pain in throat: Secondary | ICD-10-CM | POA: Diagnosis not present

## 2020-06-23 ENCOUNTER — Encounter: Payer: Self-pay | Admitting: Cardiology

## 2020-06-23 ENCOUNTER — Ambulatory Visit: Payer: PPO | Admitting: Cardiology

## 2020-06-23 VITALS — BP 132/82 | HR 91 | Ht 64.0 in | Wt 159.2 lb

## 2020-06-23 DIAGNOSIS — I6523 Occlusion and stenosis of bilateral carotid arteries: Secondary | ICD-10-CM

## 2020-06-23 DIAGNOSIS — I059 Rheumatic mitral valve disease, unspecified: Secondary | ICD-10-CM | POA: Diagnosis not present

## 2020-06-23 DIAGNOSIS — I050 Rheumatic mitral stenosis: Secondary | ICD-10-CM | POA: Diagnosis not present

## 2020-06-23 DIAGNOSIS — I429 Cardiomyopathy, unspecified: Secondary | ICD-10-CM | POA: Diagnosis not present

## 2020-06-23 NOTE — Patient Instructions (Addendum)
Your physician recommends that you schedule a follow-up appointment in: [place]  Your physician recommends that you continue on your current medications as directed. Please refer to the Current Medication list given to you today.  Your physician has requested that you have an echocardiogram JUST PRIOR TO YOUR NEXT APPOINTMENT. Echocardiography is a painless test that uses sound waves to create images of your heart. It provides your doctor with information about the size and shape of your heart and how well your heart's chambers and valves are working. This procedure takes approximately one hour. There are no restrictions for this procedure.  Thank you for choosing [facility]!!

## 2020-06-23 NOTE — Progress Notes (Signed)
Cardiology Office Note  Date: 06/23/2020   ID: Bleu, Moisan 30-Sep-1950, MRN [id number]  PCP:  Gwenlyn Perking, FNP  Cardiologist:  Rozann Lesches, MD Electrophysiologist:  None   Chief Complaint  Patient presents with  . Cardiac follow-up    History of Present Illness: Carol Richmond is a 70 y.o. female last seen in November 2021.  She is here today with her youngest daughter for a follow-up visit.  She does not report any change in stamina or shortness of breath, has been enjoying walking with a friend of hers.  Recent follow-up echocardiogram revealed LVEF 45 to 50%, normal RV contraction, severely dilated left atrium, moderate mitral stenosis with moderate mitral regurgitation, and sclerotic aortic valve with mild aortic regurgitation.  We discussed these results today.  She has recently been started on Entresto 24/26 mg twice daily.  She is tolerating the medication so far.  We will get follow-up lab work next week when she visits at [facility].  The remainder of her cardiac medications are stable and outlined below.  Past Medical History:  Diagnosis Date  . Asthma   . Carotid artery disease ([place])   . Diverticulosis of colon (without mention of hemorrhage)   . Essential hypertension   . Functional ovarian cysts   . GERD (gastroesophageal reflux disease)   . Hemorrhoids, external, without mention of complication   . Hormone replacement therapy (postmenopausal)   . IBS (irritable bowel syndrome)   . Migraine   . Mitral valve disease   . Right knee DJD   . Seizures ([place])   . Sleep apnea   . TIA (transient ischemic attack)   . Type 2 diabetes mellitus ([place])     Past Surgical History:  Procedure Laterality Date  . BIOPSY  09/23/2018   Procedure: BIOPSY;  Surgeon: Rogene Houston, MD;  [facility];  Service: Endoscopy;;  duodeneum and stomach  . CHOLECYSTECTOMY  1980  . ENDARTERECTOMY Right 07/01/2017   Procedure: ENDARTERECTOMY CAROTID RIGHT;   Surgeon: Serafina Mitchell, MD;  Location: [facility] OR;  Service: Vascular;  Laterality: Right;  . ESOPHAGOGASTRODUODENOSCOPY N/A 09/23/2018   Procedure: ESOPHAGOGASTRODUODENOSCOPY (EGD);  Surgeon: Rogene Houston, MD;  [facility];  Service: Endoscopy;  Laterality: N/A;  2:45-moved to 1:45 per Lelon Frohlich  . LEFT HEART CATHETERIZATION WITH CORONARY ANGIOGRAM N/A 11/18/2013   Procedure: LEFT HEART CATHETERIZATION WITH CORONARY ANGIOGRAM;  Surgeon: Burnell Blanks, MD;  Location: [facility] CATH LAB;  Service: Cardiovascular;  Laterality: N/A;  . OOPHORECTOMY     2012 Dr. Adah Perl in [place]  . PATCH ANGIOPLASTY Right 07/01/2017   Procedure: RIGHT CAROTID PATCH ANGIOPLASTY;  Surgeon: Serafina Mitchell, MD;  [facility]: [facility] OR;  Service: Vascular;  Laterality: Right;  . TONSILLECTOMY    . TOTAL KNEE ARTHROPLASTY Right 09/02/2012   Procedure: TOTAL KNEE ARTHROPLASTY- right;  Surgeon: Ninetta Lights, MD;  Location: [place];  Service: Orthopedics;  Laterality: Right;  . TUBAL LIGATION    . TYMPANOMASTOIDECTOMY Left 04/02/2016   Procedure: LEFT CANAL WALL DOWN TYMPANOMASTOIDECTOMY;  Surgeon: Leta Baptist, MD;  Location: [place];  Service: ENT;  Laterality: Left;  [name] VAGINAL HYSTERECTOMY  1990  . WOUND EXPLORATION Right 07/02/2017   Procedure: RIGHT NECK EXPLORATION WITH INTRAOPERATIVE ULTRASOUND;  Surgeon: Angelia Mould, MD;  Location[facility] OR;  Service: Vascular;  Laterality: Right;    Current Outpatient Medications  Medication Sig Dispense Refill  . Acetaminophen (TYLENOL ARTHRITIS EXT RELIEF  PO) Take 650 mg by mouth daily.     [name] albuterol (VENTOLIN HFA) 108 (90 Base) MCG/ACT inhaler Inhale 2 puffs into the lungs every 4 (four) hours as needed for shortness of breath. 18 g 11  . aspirin EC 81 MG tablet Take 1 tablet (81 mg total) by mouth daily.    . baclofen (LIORESAL) 10 MG tablet Take 1 tablet (10 mg total) by mouth 3 (three) times daily. 30 each 2  . carvedilol (COREG) 3.125 MG tablet  Take 1 tablet (3.125 mg total) by mouth 2 (two) times daily with a meal. 180 tablet 1  . cetirizine (ZYRTEC) 10 MG tablet Take 1 tablet (10 mg total) by mouth daily. 90 tablet 2  . cholecalciferol (VITAMIN D) 1000 UNITS tablet Take 1,000 Units by mouth daily.    . DULoxetine (CYMBALTA) 60 MG capsule Take 1 capsule (60 mg total) by mouth daily. 30 capsule 2  . furosemide (LASIX) 40 MG tablet Take 1 tablet (40 mg total) by mouth 2 (two) times daily. 60 tablet 0  . gabapentin (NEURONTIN) 300 MG capsule Take 300 mg by mouth at bedtime.    [name] lactulose (CHRONULAC) 10 GM/15ML solution Take 30 mLs (20 g total) by mouth 3 (three) times daily. 1892 mL 5  . lamoTRIgine (LAMICTAL) 200 MG tablet Take 1 tablet (200 mg total) by mouth 2 (two) times daily. 60 tablet 2  . loperamide (IMODIUM) 2 MG capsule Take 2 tablets after the first loose stool, then 1 tablet after each loose stool. Max of 8 tablets per day. 30 capsule 0  . LORazepam (ATIVAN) 0.5 MG tablet Take 1 tablet (0.5 mg total) by mouth 2 (two) times daily as needed for anxiety. 30 tablet 2  . Methylcobalamin (B12-ACTIVE PO) daily.    . ondansetron (ZOFRAN) 4 MG tablet Take 1 tablet (4 mg total) by mouth every 8 (eight) hours as needed for nausea or vomiting. 20 tablet 0  . Oxycodone HCl 10 MG TABS Take 10 mg by mouth 3 (three) times daily as needed.    . pantoprazole (PROTONIX) 40 MG tablet Take 1 tablet (40 mg total) by mouth daily before breakfast. 90 tablet 3  . Pediatric Multivitamins-Iron (FLINTSTONES PLUS IRON) chewable tablet Chew 1 tablet by mouth 2 (two) times daily.    . potassium chloride SA (K-DUR,KLOR-CON) 20 MEQ tablet Take 20 mEq by mouth daily.     . sacubitril-valsartan (ENTRESTO) 24-26 MG Take 1 tablet by mouth 2 (two) times daily. 60 tablet 6   Current Facility-Administered Medications  Medication Dose Route Frequency Provider Last Rate Last Admin  . magic mouthwash  5 mL Oral TID PRN Ezzard Standing, PA-C       Allergies:   Patient has no known allergies.   ROS: No orthopnea or PND.  Physical Exam: VS:  BP 132/82   Pulse 91   Ht 5' 4"  (1.626 m)   Wt 159 lb 3.2 oz (72.2 kg)   SpO2 99%   BMI 27.33 kg/m , BMI Body mass index is 27.33 kg/m.  Wt Readings from Last 3 Encounters:  06/23/20 159 lb 3.2 oz (72.2 kg)  06/19/20 158 lb (71.7 kg)  05/26/20 157 lb 8 oz (71.4 kg)    General: Patient appears comfortable at rest. HEENT: Conjunctiva and lids normal, wearing a mask. Neck: Supple, no elevated JVP or carotid bruits, no thyromegaly. Lungs: Clear to auscultation, nonlabored breathing at rest. Cardiac: Regular rate and rhythm, no S3, 2/6 systolic murmur. Abdomen:  Soft, nontender, bowel sounds present. Extremities: No pitting edema, distal pulses 2+.  ECG:  An ECG dated 12/23/2019 was personally reviewed today and demonstrated:  Normal sinus rhythm with left bundle branch block.  Recent Labwork: 03/29/2020: ALT 20; AST 35; BUN 12; Creatinine, Ser 0.91; Hemoglobin 14.8; Platelets 217; Potassium 3.5; Sodium 143; TSH 0.941     Component Value Date/Time   CHOL 214 (H) 03/29/2020 1145   TRIG 155 (H) 03/29/2020 1145   HDL 71 03/29/2020 1145   CHOLHDL 3.0 03/29/2020 1145   CHOLHDL 5.2 05/08/2007 0530   VLDL 38 05/08/2007 0530   LDLCALC 116 (H) 03/29/2020 1145    Other Studies Reviewed Today:  Echocardiogram 06/21/2020: 1. Left ventricular ejection fraction, by estimation, is 45 to 50%. The  left ventricle has mildly decreased function. The left ventricle has no  regional wall motion abnormalities. The left ventricular internal cavity  size was mildly dilated. There is  mild asymmetric left ventricular hypertrophy of the septal segment. Left  ventricular diastolic parameters are indeterminate.  2. Right ventricular systolic function is normal. The right ventricular  size is normal. Tricuspid regurgitation signal is inadequate for assessing  PA pressure.  3. Left atrial size was severely dilated.  4.  Mitral valve area by planimetry 1.28 cm2. The mitral valve is  rheumatic. Moderate mitral valve regurgitation. Moderate mitral stenosis.  The mean mitral valve gradient is 6.5 mmHg.  5. The aortic valve is tricuspid. Aortic valve regurgitation is mild.  Mild to moderate aortic valve sclerosis/calcification is present, without  any evidence of aortic stenosis. Aortic regurgitation PHT measures 790  msec.  6. The inferior vena cava is normal in size with greater than 50%  respiratory variability, suggesting right atrial pressure of 3 mmHg.   Assessment and Plan:  1.  Mitral valve disease, rheumatic appearing with evidence of moderate mitral stenosis and moderate mitral regurgitation by recent echocardiogram.  Continue to follow.  Repeat echocardiogram in 6 months.  2.  Secondary cardiomyopathy, LVEF 45 to 50% range by most recent assessment, RV contraction normal.  Entresto has been initiated at 24/26 mg twice daily.  Continue Coreg, Lasix, and potassium supplement otherwise.  Follow-up BMET next week.  3.  Mild carotid artery disease, asymptomatic.  She remains on aspirin.  Medication Adjustments/Labs and Tests Ordered: Current medicines are reviewed at length with the patient today.  Concerns regarding medicines are outlined above.   Tests Ordered: Orders Placed This Encounter  Procedures  . ECHOCARDIOGRAM COMPLETE    Medication Changes: No orders of the defined types were placed in this encounter.   Disposition:  Follow up 6 months.  Signed, Satira Sark, MD, [facility] 06/23/2020 2:19 PM    [facility] [place], [place], [place] [address] Phone: [phone]; Fax: [phone]

## 2020-06-27 ENCOUNTER — Other Ambulatory Visit: Payer: Self-pay

## 2020-06-27 ENCOUNTER — Ambulatory Visit (INDEPENDENT_AMBULATORY_CARE_PROVIDER_SITE_OTHER): Payer: Medicare HMO | Admitting: Family Medicine

## 2020-06-27 ENCOUNTER — Encounter: Payer: Self-pay | Admitting: Family Medicine

## 2020-06-27 VITALS — BP 118/66 | HR 67 | Temp 97.0°F | Ht 64.0 in | Wt 159.0 lb

## 2020-06-27 DIAGNOSIS — G629 Polyneuropathy, unspecified: Secondary | ICD-10-CM | POA: Diagnosis not present

## 2020-06-27 DIAGNOSIS — Z1159 Encounter for screening for other viral diseases: Secondary | ICD-10-CM

## 2020-06-27 DIAGNOSIS — Z79899 Other long term (current) drug therapy: Secondary | ICD-10-CM | POA: Diagnosis not present

## 2020-06-27 DIAGNOSIS — R569 Unspecified convulsions: Secondary | ICD-10-CM

## 2020-06-27 DIAGNOSIS — F411 Generalized anxiety disorder: Secondary | ICD-10-CM | POA: Diagnosis not present

## 2020-06-27 DIAGNOSIS — I471 Supraventricular tachycardia, unspecified: Secondary | ICD-10-CM | POA: Insufficient documentation

## 2020-06-27 DIAGNOSIS — F339 Major depressive disorder, recurrent, unspecified: Secondary | ICD-10-CM

## 2020-06-27 DIAGNOSIS — J4530 Mild persistent asthma, uncomplicated: Secondary | ICD-10-CM | POA: Diagnosis not present

## 2020-06-27 DIAGNOSIS — I70211 Atherosclerosis of native arteries of extremities with intermittent claudication, right leg: Secondary | ICD-10-CM

## 2020-06-27 DIAGNOSIS — K219 Gastro-esophageal reflux disease without esophagitis: Secondary | ICD-10-CM | POA: Diagnosis not present

## 2020-06-27 DIAGNOSIS — I209 Angina pectoris, unspecified: Secondary | ICD-10-CM

## 2020-06-27 DIAGNOSIS — I10 Essential (primary) hypertension: Secondary | ICD-10-CM

## 2020-06-27 DIAGNOSIS — K7460 Unspecified cirrhosis of liver: Secondary | ICD-10-CM

## 2020-06-27 DIAGNOSIS — I050 Rheumatic mitral stenosis: Secondary | ICD-10-CM | POA: Diagnosis not present

## 2020-06-27 DIAGNOSIS — R6889 Other general symptoms and signs: Secondary | ICD-10-CM | POA: Diagnosis not present

## 2020-06-27 MED ORDER — FLUTICASONE PROPIONATE HFA 44 MCG/ACT IN AERO: 2.0000 | INHALATION_SPRAY | Freq: Two times a day (BID) | RESPIRATORY_TRACT | 12 refills | Status: DC

## 2020-06-27 MED ORDER — FLUTICASONE PROPIONATE 50 MCG/ACT NA SUSP: 1.0000 | Freq: Every day | NASAL | 2 refills | Status: DC

## 2020-06-27 MED ORDER — LORAZEPAM 0.5 MG PO TABS: 0.5000 mg | ORAL_TABLET | Freq: Two times a day (BID) | ORAL | 2 refills | Status: DC | PRN

## 2020-06-27 NOTE — Progress Notes (Signed)
Established Patient Office Visit  Subjective:  [name], female    DOB: August 23, 1950  Age: 70 y.o. MRN: [id number]  CC:  Chief Complaint  Patient presents with  . Medical Management of Chronic Issues    HPI Carol Richmond presents for chronic follow up. She saw her cardiologist a few days ago. She will follow up in 6 months for another echo. She was started on Entresto.   1. HTN Complaint with meds - Yes Current Medications - Coreg, and Entresto Checking BP at home: no Exercising Regularly - No Watching Salt intake - No Pertinent ROS:  Headache - No Fatigue - No Visual Disturbances - No Chest pain - No Dyspnea - No Palpitations - No LE edema - No They report good compliance with medications and can restate their regimen by memory. No medication side effects.  Family, social, and smoking history reviewed.   BP Readings from Last 3 Encounters:  06/27/20 118/66  06/23/20 132/82  06/19/20 127/82   CMP Latest Ref Rng & Units 03/29/2020 09/21/2019 08/15/2017  Glucose 65 - 99 mg/dL 114(H) 90 129(H)  BUN 8 - 27 mg/dL _0 Creatinine 0.57 - 1.00 mg/dL 0.91 1.03(H) 0.87  Sodium 134 - 144 mmol/L 143 142 142  Potassium 3.5 - 5.2 mmol/L 3.5 4.3 3.2(L)  Chloride 96 - 106 mmol/L 99 99 101  CO2 20 - 29 mmol/L 27 37(H) 29  Calcium 8.7 - 10.3 mg/dL 9.5 9.6 8.6(L)  Total Protein 6.0 - 8.5 g/dL 7.3 7.1 7.3  Total Bilirubin 0.0 - 1.2 mg/dL 0.4 0.6 1.0  Alkaline Phos 44 - 121 IU/L 139(H) - 79  AST 0 - 40 IU/L 35 32 35  ALT 0 - 32 IU/L _1 2. Anxiety, depression Depression screen [facility] 2/9 06/27/2020 04/13/2020 03/29/2020  Decreased Interest 0 3 0  Down, Depressed, Hopeless 0 1 0  PHQ - 2 Score 0 4 0  Altered sleeping 0 1 -  Tired, decreased energy 1 3 -  Change in appetite 0 1 -  Feeling bad or failure about yourself  0 0 -  Trouble concentrating 0 0 -  Moving slowly or fidgety/restless 0 0 -  Suicidal thoughts 0 0 -  PHQ-9 Score 1 9 -   Difficult doing work/chores Not difficult at all Not difficult at all -   GAD 7 : Generalized Anxiety Score 06/27/2020 04/13/2020  Nervous, Anxious, on Edge 1 2  Control/stop worrying 0 0  Worry too much - different things 1 0  Trouble relaxing 0 0  Restless 0 0  Easily annoyed or irritable 0 0  Afraid - awful might happen 0 0  Total GAD 7 Score 2 2  Anxiety Difficulty Not difficult at all -    3. GERD Compliant with medications - Yes Current medications - protonix Adverse side effects - No Red Flags (weight loss, hematochezia, melena, weight loss, early satiety, fevers, odynophagia, or persistent vomiting) - No  4. Wheezing Kathyrn reports intermittent wheezing. She has been using albuterol prn with some relief. Denies shortness of breath or fever. She also reports a chronic cough. She takes zyrtec daily for her allergies. Denies history of asthma or COPD. She does not smoker and never has.   5. Seizures On lamictal BID. Hasn't had a seizure in many years.   6. Neuropathy Takes gabapentin with good relief.   7. Cirrhosis Managed by GI. Stable.   Past Medical History:  Diagnosis Date  . Asthma   . Carotid artery disease ([place])   . Diverticulosis of colon (without mention of hemorrhage)   . Essential hypertension   . Functional ovarian cysts   . GERD (gastroesophageal reflux disease)   . Hemorrhoids, external, without mention of complication   . Hormone replacement therapy (postmenopausal)   . IBS (irritable bowel syndrome)   . Migraine   . Mitral valve disease   . Right knee DJD   . Seizures ([place])   . Sleep apnea   . TIA (transient ischemic attack)   . Type 2 diabetes mellitus ([place])   . Type 2 diabetes mellitus with hyperglycemia, without long-term current use of insulin ([place]) 08/07/2017    Past Surgical History:  Procedure Laterality Date  . BIOPSY  09/23/2018   Procedure: BIOPSY;  Surgeon: Rogene Houston, MD;  [facility];  Service: Endoscopy;;   duodeneum and stomach  . CHOLECYSTECTOMY  1980  . ENDARTERECTOMY Right 07/01/2017   Procedure: ENDARTERECTOMY CAROTID RIGHT;  Surgeon: Serafina Mitchell, MD;  Location: [facility] OR;  Service: Vascular;  Laterality: Right;  . ESOPHAGOGASTRODUODENOSCOPY N/A 09/23/2018   Procedure: ESOPHAGOGASTRODUODENOSCOPY (EGD);  Surgeon: Rogene Houston, MD;  [facility];  Service: Endoscopy;  Laterality: N/A;  2:45-moved to 1:45 per Lelon Frohlich  . LEFT HEART CATHETERIZATION WITH CORONARY ANGIOGRAM N/A 11/18/2013   Procedure: LEFT HEART CATHETERIZATION WITH CORONARY ANGIOGRAM;  Surgeon: Burnell Blanks, MD;  Location: [facility] CATH LAB;  Service: Cardiovascular;  Laterality: N/A;  . OOPHORECTOMY     2012 Dr. Adah Perl in [place]  . PATCH ANGIOPLASTY Right 07/01/2017   Procedure: RIGHT CAROTID PATCH ANGIOPLASTY;  Surgeon: Serafina Mitchell, MD;  [facility] OR;  Service: Vascular;  Laterality: Right;  . TONSILLECTOMY    . TOTAL KNEE ARTHROPLASTY Right 09/02/2012   Procedure: TOTAL KNEE ARTHROPLASTY- right;  Surgeon: Ninetta Lights, MD;  Location: [place];  Service: Orthopedics;  Laterality: Right;  . TUBAL LIGATION    . TYMPANOMASTOIDECTOMY Left 04/02/2016   Procedure: LEFT CANAL WALL DOWN TYMPANOMASTOIDECTOMY;  Surgeon: Leta Baptist, MD;  Location: [place];  Service: ENT;  Laterality: Left;  [name] VAGINAL HYSTERECTOMY  1990  . WOUND EXPLORATION Right 07/02/2017   Procedure: RIGHT NECK EXPLORATION WITH INTRAOPERATIVE ULTRASOUND;  Surgeon: Angelia Mould, MD;  Location: [facility] OR;  Service: Vascular;  Laterality: Right;    Family History  Problem Relation Age of Onset  . Heart disease Mother   . Diabetes Mother   . Heart disease Brother   . Cancer Sister   . Heart disease Brother   . Heart disease Brother   . Heart disease Son     Social History   Socioeconomic History  . Marital status: Widowed    Spouse name: Not on file  . Number of children: Not on file  . Years of education: Not on file  .  Highest education level: Not on file  Occupational History  . Not on file  Tobacco Use  . Smoking status: Never Smoker  . Smokeless tobacco: Never Used  Vaping Use  . Vaping Use: Never used  Substance and Sexual Activity  . Alcohol use: No    Alcohol/week: 0.0 standard drinks  . Drug use: No  . Sexual activity: Not on file  Other Topics Concern  . Not on file  Social History Narrative  . Not on file   Social Determinants of Health   Financial Resource Strain: Not on file  Food Insecurity: Not on file  Transportation Needs: Not on file  Physical Activity: Not on file  Stress: Not on file  Social Connections: Not on file  Intimate Partner Violence: Not on file    Outpatient Medications Prior to Visit  Medication Sig Dispense Refill  . Acetaminophen (TYLENOL ARTHRITIS EXT RELIEF PO) Take 650 mg by mouth daily.     [name] albuterol (VENTOLIN HFA) 108 (90 Base) MCG/ACT inhaler Inhale 2 puffs into the lungs every 4 (four) hours as needed for shortness of breath. 18 g 11  . aspirin EC 81 MG tablet Take 1 tablet (81 mg total) by mouth daily.    . baclofen (LIORESAL) 10 MG tablet Take 1 tablet (10 mg total) by mouth 3 (three) times daily. 30 each 2  . carvedilol (COREG) 3.125 MG tablet Take 1 tablet (3.125 mg total) by mouth 2 (two) times daily with a meal. 180 tablet 1  . cetirizine (ZYRTEC) 10 MG tablet Take 1 tablet (10 mg total) by mouth daily. 90 tablet 2  . cholecalciferol (VITAMIN D) 1000 UNITS tablet Take 1,000 Units by mouth daily.    . DULoxetine (CYMBALTA) 60 MG capsule Take 1 capsule (60 mg total) by mouth daily. 30 capsule 2  . furosemide (LASIX) 40 MG tablet Take 1 tablet (40 mg total) by mouth 2 (two) times daily. 60 tablet 0  . gabapentin (NEURONTIN) 300 MG capsule Take 300 mg by mouth at bedtime.    [name] lactulose (CHRONULAC) 10 GM/15ML solution Take 30 mLs (20 g total) by mouth 3 (three) times daily. 1892 mL 5  . lamoTRIgine (LAMICTAL) 200 MG tablet Take 1 tablet (200 mg  total) by mouth 2 (two) times daily. 60 tablet 2  . loperamide (IMODIUM) 2 MG capsule Take 2 tablets after the first loose stool, then 1 tablet after each loose stool. Max of 8 tablets per day. 30 capsule 0  . LORazepam (ATIVAN) 0.5 MG tablet Take 1 tablet (0.5 mg total) by mouth 2 (two) times daily as needed for anxiety. 30 tablet 2  . Methylcobalamin (B12-ACTIVE PO) daily.    . ondansetron (ZOFRAN) 4 MG tablet Take 1 tablet (4 mg total) by mouth every 8 (eight) hours as needed for nausea or vomiting. 20 tablet 0  . Oxycodone HCl 10 MG TABS Take 10 mg by mouth 3 (three) times daily as needed.    . pantoprazole (PROTONIX) 40 MG tablet Take 1 tablet (40 mg total) by mouth daily before breakfast. 90 tablet 3  . Pediatric Multivitamins-Iron (FLINTSTONES PLUS IRON) chewable tablet Chew 1 tablet by mouth 2 (two) times daily.    . potassium chloride SA (K-DUR,KLOR-CON) 20 MEQ tablet Take 20 mEq by mouth daily.     . sacubitril-valsartan (ENTRESTO) 24-26 MG Take 1 tablet by mouth 2 (two) times daily. 60 tablet 6   Facility-Administered Medications Prior to Visit  Medication Dose Route Frequency Provider Last Rate Last Admin  . magic mouthwash  5 mL Oral TID PRN Laurine Blazer B, PA-C        No Known Allergies  ROS Review of Systems Negative unless specially indicated above in HPI.   Objective:    Physical Exam Vitals and nursing note reviewed.  Constitutional:      General: She is not in acute distress.    Appearance: Normal appearance. She is not ill-appearing, toxic-appearing or diaphoretic.  Eyes:     Conjunctiva/sclera: Conjunctivae normal.     Pupils: Pupils are equal, round, and reactive to  light.  Cardiovascular:     Rate and Rhythm: Normal rate and regular rhythm.     Heart sounds: Normal heart sounds. No murmur heard.   Pulmonary:     Effort: Pulmonary effort is normal. No respiratory distress.     Breath sounds: No stridor. Wheezing (expiratory) present. No rhonchi or  rales.  Chest:     Chest wall: No tenderness.  Abdominal:     General: Bowel sounds are normal.     Palpations: Abdomen is soft.  Musculoskeletal:     Right lower leg: No edema.     Left lower leg: No edema.  Skin:    General: Skin is warm and dry.  Neurological:     General: No focal deficit present.     Mental Status: She is alert and oriented to person, place, and time.     Motor: No weakness.     Gait: Gait normal.  Psychiatric:        Mood and Affect: Mood normal.        Behavior: Behavior normal.     BP 118/66   Pulse 67   Temp (!) 97 F (36.1 C) (Temporal)   Ht 5' 4" (1.626 m)   Wt 159 lb (72.1 kg)   BMI 27.29 kg/m  Wt Readings from Last 3 Encounters:  06/27/20 159 lb (72.1 kg)  06/23/20 159 lb 3.2 oz (72.2 kg)  06/19/20 158 lb (71.7 kg)     Health Maintenance Due  Topic Date Due  . Hepatitis C Screening  Never done  . MAMMOGRAM  02/02/2011    There are no preventive care reminders to display for this patient.  Lab Results  Component Value Date   TSH 0.941 03/29/2020   Lab Results  Component Value Date   WBC 12.4 (H) 03/29/2020   HGB 14.8 03/29/2020   HCT 44.3 03/29/2020   MCV 89 03/29/2020   PLT 217 03/29/2020   Lab Results  Component Value Date   NA 143 03/29/2020   K 3.5 03/29/2020   CO2 27 03/29/2020   GLUCOSE 114 (H) 03/29/2020   BUN 12 03/29/2020   CREATININE 0.91 03/29/2020   BILITOT 0.4 03/29/2020   ALKPHOS 139 (H) 03/29/2020   AST 35 03/29/2020   ALT 20 03/29/2020   PROT 7.3 03/29/2020   ALBUMIN 4.7 03/29/2020   CALCIUM 9.5 03/29/2020   ANIONGAP 12 08/15/2017   Lab Results  Component Value Date   CHOL 214 (H) 03/29/2020   Lab Results  Component Value Date   HDL 71 03/29/2020   Lab Results  Component Value Date   LDLCALC 116 (H) 03/29/2020   Lab Results  Component Value Date   TRIG 155 (H) 03/29/2020   Lab Results  Component Value Date   CHOLHDL 3.0 03/29/2020   Lab Results  Component Value Date   HGBA1C  5.7 03/29/2020      Assessment & Plan:   Baneen was seen today for medical management of chronic issues.  Diagnoses and all orders for this visit:  Primary hypertension Well controlled on current regimen. Labs pending.  -     CBC with Differential/Platelet -     CMP14+EGFR -     Lipid panel  Gastroesophageal reflux disease without esophagitis Well controlled on current regimen. Labs pending.  -     CBC with Differential/Platelet -     CMP14+EGFR  Depression, recurrent (HCC) Well controlled on current regimen.  -     CBC with Differential/Platelet -  CMP14+EGFR -     Vitamin B12 -     VITAMIN D 25 Hydroxy (Vit-D Deficiency, Fractures)  Generalized anxiety disorder/Controlled substance agreement signed Well controlled on current regimen. PDMP reviewed, no red flags. UDS and CSA are UTD. Refills provided.  -     CBC with Differential/Platelet -     CMP14+EGFR -     Vitamin B12 -     VITAMIN D 25 Hydroxy (Vit-D Deficiency, Fractures) -     LORazepam (ATIVAN) 0.5 MG tablet; Take 1 tablet (0.5 mg total) by mouth 2 (two) times daily as needed for anxiety.  Mild persistent asthma without complication Add flovent. Albuterol prn.  -     fluticasone (FLOVENT HFA) 44 MCG/ACT inhaler; Inhale 2 puffs into the lungs 2 (two) times daily.  Need for hepatitis C screening test -     Hepatitis C antibody  Neuropathy Well controlled on current regimen.  -     Vitamin B12 -     VITAMIN D 25 Hydroxy (Vit-D Deficiency, Fractures)  Cirrhosis, nonalcoholic ([place]) Managed by GI.  -     CBC with Differential/Platelet -     CMP14+EGFR  Supraventricular tachycardia ([place]) Managed by Cardiology.   Angina, class III ([place]) Managed by Cardiology  Atherosclerosis of native artery of right lower extremity with intermittent claudication (HCC) On aspirin. Managed by cardiology.  Partial seizure ([place]) Well controlled on current regimen. No seizure in recent years.    Follow-up: Return in  about 3 months (around 09/27/2020) for chronic follow up.   The patient indicates understanding of these issues and agrees with the plan.    Gwenlyn Perking, FNP

## 2020-06-27 NOTE — Patient Instructions (Signed)

## 2020-06-28 ENCOUNTER — Telehealth: Payer: Self-pay | Admitting: *Deleted

## 2020-06-28 LAB — CMP14+EGFR
ALT: 18 IU/L (ref 0–32)
AST: 26 IU/L (ref 0–40)
Albumin/Globulin Ratio: 1.3 (ref 1.2–2.2)
Albumin: 4.2 g/dL (ref 3.8–4.8)
Alkaline Phosphatase: 146 IU/L — ABNORMAL HIGH (ref 44–121)
BUN/Creatinine Ratio: 12 (ref 12–28)
BUN: 11 mg/dL (ref 8–27)
Bilirubin Total: 0.4 mg/dL (ref 0.0–1.2)
CO2: 27 mmol/L (ref 20–29)
Calcium: 9.2 mg/dL (ref 8.7–10.3)
Chloride: 101 mmol/L (ref 96–106)
Creatinine, Ser: 0.89 mg/dL (ref 0.57–1.00)
Globulin, Total: 3.2 g/dL (ref 1.5–4.5)
Glucose: 70 mg/dL (ref 65–99)
Potassium: 4.5 mmol/L (ref 3.5–5.2)
Sodium: 143 mmol/L (ref 134–144)
Total Protein: 7.4 g/dL (ref 6.0–8.5)
eGFR: 71 mL/min/{1.73_m2} (ref >59)

## 2020-06-28 LAB — LIPID PANEL
Chol/HDL Ratio: 2.6 ratio (ref 0.0–4.4)
Cholesterol, Total: 190 mg/dL (ref 100–199)
HDL: 74 mg/dL (ref >39)
LDL Chol Calc (NIH): 102 mg/dL — ABNORMAL HIGH (ref 0–99)
Triglycerides: 77 mg/dL (ref 0–149)
VLDL Cholesterol Cal: 14 mg/dL (ref 5–40)

## 2020-06-28 LAB — CBC WITH DIFFERENTIAL/PLATELET
Basophils Absolute: 0 10*3/uL (ref 0.0–0.2)
Basos: 0 %
EOS (ABSOLUTE): 0.1 10*3/uL (ref 0.0–0.4)
Eos: 0 %
Hematocrit: 39 % (ref 34.0–46.6)
Hemoglobin: 13.4 g/dL (ref 11.1–15.9)
Immature Grans (Abs): 0.1 10*3/uL (ref 0.0–0.1)
Immature Granulocytes: 1 %
Lymphocytes Absolute: 2.5 10*3/uL (ref 0.7–3.1)
Lymphs: 18 %
MCH: 28.9 pg (ref 26.6–33.0)
MCHC: 34.4 g/dL (ref 31.5–35.7)
MCV: 84 fL (ref 79–97)
Monocytes Absolute: 1.7 10*3/uL — ABNORMAL HIGH (ref 0.1–0.9)
Monocytes: 13 %
Neutrophils Absolute: 9.2 10*3/uL — ABNORMAL HIGH (ref 1.4–7.0)
Neutrophils: 68 %
Platelets: 245 10*3/uL (ref 150–450)
RBC: 4.64 x10E6/uL (ref 3.77–5.28)
RDW: 13.4 % (ref 11.7–15.4)
WBC: 13.6 10*3/uL — ABNORMAL HIGH (ref 3.4–10.8)

## 2020-06-28 LAB — VITAMIN D 25 HYDROXY (VIT D DEFICIENCY, FRACTURES): Vit D, 25-Hydroxy: 36 ng/mL (ref 30.0–100.0)

## 2020-06-28 LAB — BASIC METABOLIC PANEL
BUN/Creatinine Ratio: 14 (ref 12–28)
BUN: 12 mg/dL (ref 8–27)
CO2: 27 mmol/L (ref 20–29)
Calcium: 9.7 mg/dL (ref 8.7–10.3)
Chloride: 101 mmol/L (ref 96–106)
Creatinine, Ser: 0.87 mg/dL (ref 0.57–1.00)
Glucose: 73 mg/dL (ref 65–99)
Potassium: 4.7 mmol/L (ref 3.5–5.2)
Sodium: 143 mmol/L (ref 134–144)
eGFR: 73 mL/min/{1.73_m2} (ref >59)

## 2020-06-28 LAB — VITAMIN B12: Vitamin B-12: 1105 pg/mL (ref 232–1245)

## 2020-06-28 LAB — HEPATITIS C ANTIBODY: Hep C Virus Ab: <0.1 s/co ratio (ref 0.0–0.9)

## 2020-06-28 NOTE — Telephone Encounter (Signed)
Patient informed. Copy sent to PCP °

## 2020-06-28 NOTE — Telephone Encounter (Signed)
-----   Message from Satira Sark, MD sent at 06/28/2020  9:26 AM EDT ----- Results reviewed.  Potassium normal at 4.7 and creatinine normal at 0.87.  Continue with current medications and follow-up plan.

## 2020-07-03 ENCOUNTER — Telehealth: Payer: Self-pay

## 2020-07-04 ENCOUNTER — Ambulatory Visit (INDEPENDENT_AMBULATORY_CARE_PROVIDER_SITE_OTHER): Payer: Medicare HMO

## 2020-07-04 VITALS — Ht 64.0 in | Wt 158.0 lb

## 2020-07-04 DIAGNOSIS — Z0000 Encounter for general adult medical examination without abnormal findings: Secondary | ICD-10-CM

## 2020-07-04 NOTE — Progress Notes (Signed)
Subjective:   Carol Richmond is a 69 y.o. female who presents for an Initial Medicare Annual Wellness Visit.  Virtual Visit via Telephone Note  I connected with  Carol Richmond on 07/04/20 at  3:30 PM EDT by telephone and verified that I am speaking with the correct person using two identifiers.  Location: Patient: Home Provider: [facility] Persons participating in the virtual visit: patient/Nurse Health Advisor   I discussed the limitations, risks, security and privacy concerns of performing an evaluation and management service by telephone and the availability of in person appointments. The patient expressed understanding and agreed to proceed.  Interactive audio and video telecommunications were attempted between this nurse and patient, however failed, due to patient having technical difficulties OR patient did not have access to video capability.  We continued and completed visit with audio only.  Some vital signs may be absent or patient reported.   Carol Casas E Trellis Vanoverbeke, LPN   Review of Systems     Cardiac Risk Factors include: advanced age (>31mn, >>2women);dyslipidemia;Other (see comment), Risk factor comments: PVD, Hepatic failure, cardiomyopathy     Objective:    Today's Vitals   07/04/20 1215  Weight: 158 lb (71.7 kg)  Height: 5' 4"  (1.626 m)   Body mass index is 27.12 kg/m.  Advanced Directives 07/04/2020 09/23/2018 09/24/2017 07/21/2017 07/02/2017 06/30/2017 04/02/2016  Does Patient Have a Medical Advance Directive? Yes Yes No No No No Yes  Type of [place]amed[place] will Living will - - - [place] Does patient want to make changes to medical advance directive? - - - - - - No - Patient declined  Copy of [place] Chart? No - copy requested - - - - - No - copy requested  Would patient like information on creating a medical advance directive? - - No - Patient declined - No - Patient declined No  - Patient declined No - Patient declined  Some encounter information is confidential and restricted. Go to Review Flowsheets activity to see all data.    Current Medications (verified) Outpatient Encounter Medications as of 07/04/2020  Medication Sig  . Acetaminophen (TYLENOL ARTHRITIS EXT RELIEF PO) Take 650 mg by mouth daily.   .Marland Kitchenalbuterol (VENTOLIN HFA) 108 (90 Base) MCG/ACT inhaler Inhale 2 puffs into the lungs every 4 (four) hours as needed for shortness of breath.  .Marland Kitchenaspirin EC 81 MG tablet Take 1 tablet (81 mg total) by mouth daily.  . baclofen (LIORESAL) 10 MG tablet Take 1 tablet (10 mg total) by mouth 3 (three) times daily.  . carvedilol (COREG) 3.125 MG tablet Take 1 tablet (3.125 mg total) by mouth 2 (two) times daily with a meal.  . cetirizine (ZYRTEC) 10 MG tablet Take 1 tablet (10 mg total) by mouth daily.  . cholecalciferol (VITAMIN D) 1000 UNITS tablet Take 1,000 Units by mouth daily.  . DULoxetine (CYMBALTA) 60 MG capsule Take 1 capsule (60 mg total) by mouth daily.  . fluticasone (FLOVENT HFA) 44 MCG/ACT inhaler Inhale 2 puffs into the lungs 2 (two) times daily.  . furosemide (LASIX) 40 MG tablet Take 1 tablet (40 mg total) by mouth 2 (two) times daily.  .Marland Kitchengabapentin (NEURONTIN) 300 MG capsule Take 300 mg by mouth at bedtime.  .Marland Kitchenlactulose (CHRONULAC) 10 GM/15ML solution Take 30 mLs (20 g total) by mouth 3 (three) times daily.  .Marland KitchenlamoTRIgine (LAMICTAL) 200 MG tablet Take 1 tablet (200 mg  total) by mouth 2 (two) times daily.  [name] loperamide (IMODIUM) 2 MG capsule Take 2 tablets after the first loose stool, then 1 tablet after each loose stool. Max of 8 tablets per day.  [name] LORazepam (ATIVAN) 0.5 MG tablet Take 1 tablet (0.5 mg total) by mouth 2 (two) times daily as needed for anxiety.  . Methylcobalamin (B12-ACTIVE PO) daily.  . Oxycodone HCl 10 MG TABS Take 10 mg by mouth 3 (three) times daily as needed.  . pantoprazole (PROTONIX) 40 MG tablet Take 1 tablet (40 mg total) by  mouth daily before breakfast.  . Pediatric Multivitamins-Iron (FLINTSTONES PLUS IRON) chewable tablet Chew 1 tablet by mouth 2 (two) times daily.  . potassium chloride SA (K-DUR,KLOR-CON) 20 MEQ tablet Take 20 mEq by mouth daily.   . sacubitril-valsartan (ENTRESTO) 24-26 MG Take 1 tablet by mouth 2 (two) times daily.  . ondansetron (ZOFRAN) 4 MG tablet Take 1 tablet (4 mg total) by mouth every 8 (eight) hours as needed for nausea or vomiting. (Patient not taking: Reported on 07/04/2020)   Facility-Administered Encounter Medications as of 07/04/2020  Medication  . magic mouthwash    Allergies (verified) Patient has no known allergies.   History: Past Medical History:  Diagnosis Date  . Asthma   . Carotid artery disease ([place])   . Diverticulosis of colon (without mention of hemorrhage)   . Essential hypertension   . Functional ovarian cysts   . GERD (gastroesophageal reflux disease)   . Hemorrhoids, external, without mention of complication   . Hormone replacement therapy (postmenopausal)   . IBS (irritable bowel syndrome)   . Migraine   . Mitral valve disease   . Right knee DJD   . Seizures ([place])   . Sleep apnea   . TIA (transient ischemic attack)   . Type 2 diabetes mellitus ([name])   . Type 2 diabetes mellitus with hyperglycemia, without long-term current use of insulin ([place]) 08/07/2017   Past Surgical History:  Procedure Laterality Date  . BIOPSY  09/23/2018   Procedure: BIOPSY;  Surgeon: Rogene Houston, MD;  [facility];  Service: Endoscopy;;  duodeneum and stomach  . CHOLECYSTECTOMY  1980  . ENDARTERECTOMY Right 07/01/2017   Procedure: ENDARTERECTOMY CAROTID RIGHT;  Surgeon: Serafina Mitchell, MD;  Location: [facility] OR;  Service: Vascular;  Laterality: Right;  . ESOPHAGOGASTRODUODENOSCOPY N/A 09/23/2018   Procedure: ESOPHAGOGASTRODUODENOSCOPY (EGD);  Surgeon: Rogene Houston, MD;  [facility]: [facility];  Service: Endoscopy;  Laterality: N/A;  2:45-moved to 1:45 per Lelon Frohlich   . LEFT HEART CATHETERIZATION WITH CORONARY ANGIOGRAM N/A 11/18/2013   Procedure: LEFT HEART CATHETERIZATION WITH CORONARY ANGIOGRAM;  Surgeon: Burnell Blanks, MD;  Location: [facility] CATH LAB;  Service: Cardiovascular;  Laterality: N/A;  . OOPHORECTOMY     2012 Dr. Adah Perl in [place]  . PATCH ANGIOPLASTY Right 07/01/2017   Procedure: RIGHT CAROTID PATCH ANGIOPLASTY;  Surgeon: Serafina Mitchell, MD;  Location: [facility] OR;  Service: Vascular;  Laterality: Right;  . TONSILLECTOMY    . TOTAL KNEE ARTHROPLASTY Right 09/02/2012   Procedure: TOTAL KNEE ARTHROPLASTY- right;  Surgeon: Ninetta Lights, MD;  Location: [place];  Service: Orthopedics;  Laterality: Right;  . TUBAL LIGATION    . TYMPANOMASTOIDECTOMY Left 04/02/2016   Procedure: LEFT CANAL WALL DOWN TYMPANOMASTOIDECTOMY;  Surgeon: Leta Baptist, MD;  Location: [place];  Service: ENT;  Laterality: Left;  [name] VAGINAL HYSTERECTOMY  1990  . WOUND EXPLORATION Right 07/02/2017   Procedure: RIGHT NECK  EXPLORATION WITH INTRAOPERATIVE ULTRASOUND;  Surgeon: Angelia Mould, MD;  Location[facility] OR;  Service: Vascular;  Laterality: Right;   Family History  Problem Relation Age of Onset  . Heart disease Mother   . Diabetes Mother   . Heart disease Brother   . Cancer Sister   . Heart disease Brother   . Heart disease Brother   . Heart disease Son    Social History   Socioeconomic History  . Marital status: Widowed    Spouse name: Not on file  . Number of children: 3  . Years of education: Not on file  . Highest education level: Not on file  Occupational History  . Not on file  Tobacco Use  . Smoking status: Never Smoker  . Smokeless tobacco: Never Used  Vaping Use  . Vaping Use: Never used  Substance and Sexual Activity  . Alcohol use: No    Alcohol/week: 0.0 standard drinks  . Drug use: No  . Sexual activity: Not on file  Other Topics Concern  . Not on file  Social History Narrative   Lives alone. Granddaughter lives across  the street, her 3 children all live nearby   Social Determinants of Health   Financial Resource Strain: Low Risk   . Difficulty of Paying Living Expenses: Not hard at all  Food Insecurity: No Food Insecurity  . Worried About Charity fundraiser in the Last Year: Never true  . Ran Out of Food in the Last Year: Never true  Transportation Needs: No Transportation Needs  . Lack of Transportation (Medical): No  . Lack of Transportation (Non-Medical): No  Physical Activity: Sufficiently Active  . Days of Exercise per Week: 7 days  . Minutes of Exercise per Session: 30 min  Stress: No Stress Concern Present  . Feeling of Stress : Not at all  Social Connections: Moderately Integrated  . Frequency of Communication with Friends and Family: More than three times a week  . Frequency of Social Gatherings with Friends and Family: More than three times a week  . Attends Religious Services: More than 4 times per year  . Active Member of Clubs or Organizations: Yes  . Attends Archivist Meetings: More than 4 times per year  . Marital Status: Widowed    Tobacco Counseling Counseling given: Not Answered   Clinical Intake:  Pre-visit preparation completed: Yes  Pain : No/denies pain     BMI - recorded: 27.12 Nutritional Status: BMI 25 -29 Overweight Nutritional Risks: None Diabetes: No  How often do you need to have someone help you when you read instructions, pamphlets, or other written materials from your doctor or pharmacy?: 1 - Never  Diabetic? No  Interpreter Needed?: No  Information entered by :: Karin Pinedo, LPN   Activities of Daily Living In your present state of health, do you have any difficulty performing the following activities: 07/04/2020  Hearing? Y  Vision? N  Difficulty concentrating or making decisions? N  Walking or climbing stairs? N  Dressing or bathing? N  Doing errands, shopping? N  Preparing Food and eating ? N  Using the Toilet? N  In the  past six months, have you accidently leaked urine? N  Do you have problems with loss of bowel control? N  Managing your Medications? N  Managing your Finances? N  Housekeeping or managing your Housekeeping? N  Some recent data might be hidden    Patient Care Team: Gwenlyn Perking, FNP as PCP -  General (Family Medicine) Satira Sark, MD as PCP - Cardiology (Cardiology) Cameron Sprang, MD as Consulting Physician (Neurology) Rogene Houston, MD as Consulting Physician (Gastroenterology) Leticia Clas, [place] (Optometry) Angelia Mould, MD as Consulting Physician (Vascular Surgery)  Indicate any recent Medical Services you may have received from other than [facility] providers in the past year (date may be approximate).     Assessment:   This is a routine wellness examination for Brandan.  Hearing/Vision screen  Hearing Screening   125Hz  250Hz  500Hz  1000Hz  2000Hz  3000Hz  4000Hz  6000Hz  8000Hz   Right ear:           Left ear:           Comments: C/o moderate hearing loss - Has appt to get hearing aids in [place].  Vision Screening Comments: Wears eyeglasses - Annual visits with Dr Rosana Hoes in [place]  - up to date with eye exams  Dietary issues and exercise activities discussed: Current Exercise Habits: Home exercise routine, Type of exercise: walking, Time (Minutes): 30, Frequency (Times/Week): 7, Weekly Exercise (Minutes/Week): 210, Intensity: Mild, Exercise limited by: orthopedic condition(s)  Goals Addressed            This Visit's Progress   . Patient Stated       She wants to try to walk 1-2 miles each day and lose some weight      Depression Screen PHQ 2/9 Scores 07/04/2020 06/27/2020 04/13/2020 03/29/2020 09/24/2017 07/04/2017 05/30/2017  PHQ - 2 Score 0 0 4 0 0 0 0  PHQ- 9 Score 0 1 9 - - - -    Fall Risk Fall Risk  07/04/2020 06/27/2020 04/13/2020 03/29/2020 07/15/2017  Falls in the past year? 0 0 0 0 No  Number falls in past yr: 0 - 0 - -  Injury with Fall? 0 - 0 -  -  Risk Factor Category  - - - - -  Risk for fall due to : Orthopedic patient;Impaired vision;Medication side effect - No Fall Risks - -  Follow up Falls prevention discussed - Education provided - -    FALL RISK PREVENTION PERTAINING TO THE HOME:  Any stairs in or around the home? Yes  If so, are there any without handrails? No  Home free of loose throw rugs in walkways, pet beds, electrical cords, etc? Yes  Adequate lighting in your home to reduce risk of falls? Yes   ASSISTIVE DEVICES UTILIZED TO PREVENT FALLS:  Life alert? No  Use of a cane, walker or w/c? No  Grab bars in the bathroom? Yes  Shower chair or bench in shower? Yes  Elevated toilet seat or a handicapped toilet? Yes   TIMED UP AND GO:  Was the test performed? No . Telephonic visit.  Cognitive Function:     6CIT Screen 07/04/2020  What Year? 0 points  What month? 0 points  What time? 0 points  Count back from 20 0 points  Months in reverse 0 points  Repeat phrase 0 points  Total Score 0    Immunizations Immunization History  Administered Date(s) Administered  . Influenza Split 12/02/2012  . Influenza,inj,Quad PF,6+ Mos 12/08/2018  . Influenza,inj,quad, With Preservative 11/28/2017, 12/05/2018  . Influenza-Unspecified 01/19/2020  . Moderna Sars-Covid-2 Vaccination 04/15/2019, 05/14/2019, 02/29/2020  . Tdap 12/13/2002    TDAP status: Up to date  Flu Vaccine status: Up to date  Pneumococcal vaccine status: Up to date  Covid-19 vaccine status: Completed vaccines  Qualifies for Shingles Vaccine? Yes   Zostavax  completed No   Shingrix Completed?: No.    Education has been provided regarding the importance of this vaccine. Patient has been advised to call insurance company to determine out of pocket expense if they have not yet received this vaccine. Advised may also receive vaccine at local pharmacy or Health Dept. Verbalized acceptance and understanding.  Screening Tests Health Maintenance   Topic Date Due  . MAMMOGRAM  02/02/2011  . TETANUS/TDAP  05/26/2021 (Originally 12/12/2012)  . DEXA SCAN  06/27/2021 (Originally 06/16/2017)  . PNA vac Low Risk Adult (1 of 2 - PCV13) 06/27/2021 (Originally 06/16/2017)  . INFLUENZA VACCINE  09/18/2020  . COLONOSCOPY (Pts 45-71yr Insurance coverage will need to be confirmed)  06/03/2027  . COVID-19 Vaccine  Completed  . Hepatitis C Screening  Completed  . HPV VACCINES  Aged Out    Health Maintenance  Health Maintenance Due  Topic Date Due  . MAMMOGRAM  02/02/2011    Colorectal cancer screening: Type of screening: Colonoscopy. Completed 06/02/2017. Repeat every 10 years  Mammogram status: Ordered 03/2020. Pt provided with contact info and advised to call to schedule appt.   Bone Density status: Completed 2021 per patient. Results reflect: Bone density results: NORMAL. Repeat every 2 years.  Lung Cancer Screening: (Low Dose CT Chest recommended if Age 769-80years, 30 pack-year currently smoking OR have quit w/in 15years.) does not qualify.   Additional Screening:  Hepatitis C Screening: does qualify; Completed 06/27/2020  Vision Screening: Recommended annual ophthalmology exams for early detection of glaucoma and other disorders of the eye. Is the patient up to date with their annual eye exam?  Yes  Who is the provider or what is the name of the office in which the patient attends annual eye exams? Dr DRosana Hoes[place] pt is not established with a provider, would they like to be referred to a provider to establish care? No .   Dental Screening: Recommended annual dental exams for proper oral hygiene  Community Resource Referral / Chronic Care Management: CRR required this visit?  No   CCM required this visit?  No      Plan:     I have personally reviewed and noted the following in the patient's chart:   . Medical and social history . Use of alcohol, tobacco or illicit drugs  . Current medications and supplements  including opioid prescriptions. Patient is currently taking opioid prescriptions. Information provided to patient regarding non-opioid alternatives. Patient advised to discuss non-opioid treatment plan with their provider. . Functional ability and status . Nutritional status . Physical activity . Advanced directives . List of other physicians . Hospitalizations, surgeries, and ER visits in previous 12 months . Vitals . Screenings to include cognitive, depression, and falls . Referrals and appointments  In addition, I have reviewed and discussed with patient certain preventive protocols, quality metrics, and best practice recommendations. A written personalized care plan for preventive services as well as general preventive health recommendations were provided to patient.     ASandrea Hammond LPN   [id number]  Nurse Notes: We still don't have all record/ results from screenings and vaccines from her former PCP, Dr HSherrie Sport- she will try to get these and bring to next visit or before

## 2020-07-04 NOTE — Patient Instructions (Addendum)
Carol Richmond , Thank you for taking time to come for your Medicare Wellness Visit. I appreciate your ongoing commitment to your health goals. Please review the following plan we discussed and let me know if I can assist you in the future.   Screening recommendations/referrals: Colonoscopy: Done 06/02/2017 Repeat in 10 years Mammogram: Ordered 03/2020 - make appointment soon Bone Density: Done in 2021 - we need records Recommended yearly ophthalmology/optometry visit for glaucoma screening and checkup Recommended yearly dental visit for hygiene and checkup  Vaccinations: Influenza vaccine: Done 01/19/2020 - Repeat annually Pneumococcal vaccine: Done - we need records Tdap vaccine: Done - we need records Shingles vaccine: Shingrix discussed. Please contact your pharmacy for coverage information.    Covid-19:Done 04/15/19, 05/14/19, & 02/29/2020  Advanced directives: Please bring a copy of your health care power of attorney and living will to the office to be added to your chart at your convenience.  Conditions/risks identified: Aim for 30 minutes of exercise or brisk walking each day, drink 6-8 glasses of water and eat lots of fruits and vegetables.  Next appointment: Follow up in one year for your annual wellness visit    Preventive Care 65 Years and Older, Female Preventive care refers to lifestyle choices and visits with your health care provider that can promote health and wellness. What does preventive care include?  A yearly physical exam. This is also called an annual well check.  Dental exams once or twice a year.  Routine eye exams. Ask your health care provider how often you should have your eyes checked.  Personal lifestyle choices, including:  Daily care of your teeth and gums.  Regular physical activity.  Eating a healthy diet.  Avoiding tobacco and drug use.  Limiting alcohol use.  Practicing safe sex.  Taking low-dose aspirin every day.  Taking vitamin and  mineral supplements as recommended by your health care provider. What happens during an annual well check? The services and screenings done by your health care provider during your annual well check will depend on your age, overall health, lifestyle risk factors, and family history of disease. Counseling  Your health care provider may ask you questions about your:  Alcohol use.  Tobacco use.  Drug use.  Emotional well-being.  Home and relationship well-being.  Sexual activity.  Eating habits.  History of falls.  Memory and ability to understand (cognition).  Work and work Statistician.  Reproductive health. Screening  You may have the following tests or measurements:  Height, weight, and BMI.  Blood pressure.  Lipid and cholesterol levels. These may be checked every 5 years, or more frequently if you are over 13 years old.  Skin check.  Lung cancer screening. You may have this screening every year starting at age 21 if you have a 30-pack-year history of smoking and currently smoke or have quit within the past 15 years.  Fecal occult blood test (FOBT) of the stool. You may have this test every year starting at age 23.  Flexible sigmoidoscopy or colonoscopy. You may have a sigmoidoscopy every 5 years or a colonoscopy every 10 years starting at age 28.  Hepatitis C blood test.  Hepatitis B blood test.  Sexually transmitted disease (STD) testing.  Diabetes screening. This is done by checking your blood sugar (glucose) after you have not eaten for a while (fasting). You may have this done every 1-3 years.  Bone density scan. This is done to screen for osteoporosis. You may have this done starting at age  68.  Mammogram. This may be done every 1-2 years. Talk to your health care provider about how often you should have regular mammograms. Talk with your health care provider about your test results, treatment options, and if necessary, the need for more tests. Vaccines   Your health care provider may recommend certain vaccines, such as:  Influenza vaccine. This is recommended every year.  Tetanus, diphtheria, and acellular pertussis (Tdap, Td) vaccine. You may need a Td booster every 10 years.  Zoster vaccine. You may need this after age 54.  Pneumococcal 13-valent conjugate (PCV13) vaccine. One dose is recommended after age 41.  Pneumococcal polysaccharide (PPSV23) vaccine. One dose is recommended after age 6. Talk to your health care provider about which screenings and vaccines you need and how often you need them. This information is not intended to replace advice given to you by your health care provider. Make sure you discuss any questions you have with your health care provider. Document Released: 03/03/2015 Document Revised: 10/25/2015 Document Reviewed: 12/06/2014 Elsevier Interactive Patient Education  2017 [place] Prevention in the Home Falls can cause injuries. They can happen to people of all ages. There are many things you can do to make your home safe and to help prevent falls. What can I do on the outside of my home?  Regularly fix the edges of walkways and driveways and fix any cracks.  Remove anything that might make you trip as you walk through a door, such as a raised step or threshold.  Trim any bushes or trees on the path to your home.  Use bright outdoor lighting.  Clear any walking paths of anything that might make someone trip, such as rocks or tools.  Regularly check to see if handrails are loose or broken. Make sure that both sides of any steps have handrails.  Any raised decks and porches should have guardrails on the edges.  Have any leaves, snow, or ice cleared regularly.  Use sand or salt on walking paths during winter.  Clean up any spills in your garage right away. This includes oil or grease spills. What can I do in the bathroom?  Use night lights.  Install grab bars by the toilet and in the  tub and shower. Do not use towel bars as grab bars.  Use non-skid mats or decals in the tub or shower.  If you need to sit down in the shower, use a plastic, non-slip stool.  Keep the floor dry. Clean up any water that spills on the floor as soon as it happens.  Remove soap buildup in the tub or shower regularly.  Attach bath mats securely with double-sided non-slip rug tape.  Do not have throw rugs and other things on the floor that can make you trip. What can I do in the bedroom?  Use night lights.  Make sure that you have a light by your bed that is easy to reach.  Do not use any sheets or blankets that are too big for your bed. They should not hang down onto the floor.  Have a firm chair that has side arms. You can use this for support while you get dressed.  Do not have throw rugs and other things on the floor that can make you trip. What can I do in the kitchen?  Clean up any spills right away.  Avoid walking on wet floors.  Keep items that you use a lot in easy-to-reach places.  If you need  to reach something above you, use a strong step stool that has a grab bar.  Keep electrical cords out of the way.  Do not use floor polish or wax that makes floors slippery. If you must use wax, use non-skid floor wax.  Do not have throw rugs and other things on the floor that can make you trip. What can I do with my stairs?  Do not leave any items on the stairs.  Make sure that there are handrails on both sides of the stairs and use them. Fix handrails that are broken or loose. Make sure that handrails are as long as the stairways.  Check any carpeting to make sure that it is firmly attached to the stairs. Fix any carpet that is loose or worn.  Avoid having throw rugs at the top or bottom of the stairs. If you do have throw rugs, attach them to the floor with carpet tape.  Make sure that you have a light switch at the top of the stairs and the bottom of the stairs. If you  do not have them, ask someone to add them for you. What else can I do to help prevent falls?  Wear shoes that:  Do not have high heels.  Have rubber bottoms.  Are comfortable and fit you well.  Are closed at the toe. Do not wear sandals.  If you use a stepladder:  Make sure that it is fully opened. Do not climb a closed stepladder.  Make sure that both sides of the stepladder are locked into place.  Ask someone to hold it for you, if possible.  Clearly mark and make sure that you can see:  Any grab bars or handrails.  First and last steps.  Where the edge of each step is.  Use tools that help you move around (mobility aids) if they are needed. These include:  Canes.  Walkers.  Scooters.  Crutches.  Turn on the lights when you go into a dark area. Replace any light bulbs as soon as they burn out.  Set up your furniture so you have a clear path. Avoid moving your furniture around.  If any of your floors are uneven, fix them.  If there are any pets around you, be aware of where they are.  Review your medicines with your doctor. Some medicines can make you feel dizzy. This can increase your chance of falling. Ask your doctor what other things that you can do to help prevent falls. This information is not intended to replace advice given to you by your health care provider. Make sure you discuss any questions you have with your health care provider. Document Released: 12/01/2008 Document Revised: 07/13/2015 Document Reviewed: 03/11/2014 Elsevier Interactive Patient Education  2017 [place].  Liver Failure  The liver is a large organ in the upper right-hand side of the abdomen. It is involved in many important functions, including storing energy, producing fluids that the body needs, and removing harmful substances from the bloodstream. Liver failure is a condition in which the liver loses its ability to function due to injury or disease. Liver failure can  develop quickly, over days or weeks (acute liver failure). It can also develop gradually, over months or years (chronic liver failure). What are the causes? Common causes of acute liver failure include:  Acetaminophen overdose.  Reaction to certain medicines.  Liver infection (viral hepatitis). Common causes of chronic liver failure include:  Viral hepatitis.  Liver disease.  Alcohol abuse. What  are the signs or symptoms? Most people do not have symptoms in the early stages of liver failure. If early symptoms do develop, they may include:  Loss of appetite.  Nausea and vomiting.  Weakness and tiredness (fatigue).  Weight loss without trying.  Diarrhea. As the condition worsens, symptoms of this condition may include:  Yellowing of the skin and the whites of the eyes (jaundice).  Bruising and bleeding easily.  Itchy skin (pruritus).  Fluid buildup in the abdomen (ascites).  Personality and mood changes.  Confusion and sleepiness. How is this diagnosed? This condition is diagnosed based on your symptoms, your medical history, and a physical exam. You may have tests, including:  Blood tests.  CT scan.  MRI.  Ultrasound.  Removal of a small amount of liver tissue to be examined under a microscope (biopsy). How is this treated? Treatment for this condition depends on the cause and severity of symptoms. Treatment for chronic liver failure may include:  Medicines to help reduce symptoms.  Lifestyle changes, such as limiting salt and animal proteins in your diet. Foods that contain animal proteins include red meat, fish, and dairy products. Acute or advanced (end stage) liver failure may require hospitalization. Treatment may include:  Giving antibiotic medicine.  Giving IV fluids that contain sugar (glucose) and minerals (electrolytes).  Flushing out toxic substances from the body using medicine (lactulose) or a cleansing procedure (enema).  Adding certain  amounts of the liquid part of blood (plasma) to your bloodstream (receiving a transfusion).  Using an artificial kidney to filter your blood (hemodialysis) if you have renal failure.  Using breathing support and a breathing tube (respirator).  Having a liver transplant. This is a surgery to replace your liver with another person's liver (donor liver). This may be the best option if your liver has completely stopped functioning. Follow these instructions at home: Eating and drinking  Follow instructions from your health care provider about eating and drinking restrictions. This may include: ? Limiting the amount of animal protein that you eat. ? Increasing the amount of plant-based protein that you eat. Foods that contain plant-based proteins include whole grains, nuts, and vegetables. ? Taking vitamin supplements. ? Limiting the amount of salt that you eat. Lifestyle  Do not drink alcohol.  Do not use any products that contain nicotine or tobacco, such as cigarettes, e-cigarettes, and chewing tobacco. If you need help quitting, ask your health care provider.  Exercise regularly, as told by your health care provider.   General instructions  Take over-the-counter and prescription medicines only as told by your health care provider.  Follow instructions from your health care provider about maintaining your vaccinations, especially vaccinations against hepatitis A and B.  Keep all follow-up visits as told by your health care provider. This is important. Contact a health care provider if:  You have symptoms that get worse.  You lose a lot of weight without trying.  You have a fever or chills. Get help right away if:  You become confused or very sleepy.  You cannot take care of yourself or be taken care of at home.  You are not urinating.  You have difficulty breathing.  You vomit blood. Summary  Liver failure is a condition in which the liver loses its ability to function  due to injury or disease.  Treatment for this condition depends on the cause and severity of symptoms.  Take over-the-counter and prescription medicines only as told by your health care provider.  Contact a  health care provider if you have symptoms that get worse.  Keep all follow-up visits as told by your health care provider. This is important. This information is not intended to replace advice given to you by your health care provider. Make sure you discuss any questions you have with your health care provider. Document Revised: 07/16/2017 Document Reviewed: 07/16/2017 Elsevier Patient Education  2021 [employer].

## 2020-07-07 NOTE — Telephone Encounter (Signed)
Lab work sent to cardiologist, patient aware.  Unsure why cardiology office did not look on Epic, they are part of [facility].

## 2020-07-07 NOTE — Telephone Encounter (Signed)
Pt would like for someone to explain her lab results

## 2020-07-10 ENCOUNTER — Other Ambulatory Visit: Payer: Self-pay

## 2020-07-10 DIAGNOSIS — I6523 Occlusion and stenosis of bilateral carotid arteries: Secondary | ICD-10-CM

## 2020-07-18 ENCOUNTER — Other Ambulatory Visit: Payer: Self-pay

## 2020-07-18 ENCOUNTER — Ambulatory Visit: Payer: Medicare HMO | Attending: Family Medicine | Admitting: Physical Therapy

## 2020-07-18 DIAGNOSIS — M542 Cervicalgia: Secondary | ICD-10-CM | POA: Diagnosis not present

## 2020-07-18 DIAGNOSIS — R293 Abnormal posture: Secondary | ICD-10-CM | POA: Diagnosis not present

## 2020-07-18 NOTE — Therapy (Signed)
[facility] [place], [place], [address] Phone: [phone]   Fax:  [phone]  Physical Therapy Evaluation  Patient Details  Name: Carol Richmond MRN: [id number] Date of Birth: 1951/02/04 Referring Provider (PT): Marjorie Smolder   Encounter Date: 07/18/2020   PT End of Session - 07/18/20 1100    Visit Number 1    Number of Visits 12    Date for PT Re-Evaluation 08/29/20    Authorization Type FOTO AT LEAST EVERY 5TH VISIT.  PROGRESS NOTE AT 10TH VISIT.  KX MODIFIER AFTER 15 VISITS.    PT Start Time 1012    PT Stop Time 1056    PT Time Calculation (min) 44 min    Activity Tolerance Patient tolerated treatment well    Behavior During Therapy WFL for tasks assessed/performed           Past Medical History:  Diagnosis Date  . Asthma   . Carotid artery disease ([place])   . Diverticulosis of colon (without mention of hemorrhage)   . Essential hypertension   . Functional ovarian cysts   . GERD (gastroesophageal reflux disease)   . Hemorrhoids, external, without mention of complication   . Hormone replacement therapy (postmenopausal)   . IBS (irritable bowel syndrome)   . Migraine   . Mitral valve disease   . Right knee DJD   . Seizures ([place])   . Sleep apnea   . TIA (transient ischemic attack)   . Type 2 diabetes mellitus ([place])   . Type 2 diabetes mellitus with hyperglycemia, without long-term current use of insulin ([place]) 08/07/2017    Past Surgical History:  Procedure Laterality Date  . BIOPSY  09/23/2018   Procedure: BIOPSY;  Surgeon: Rogene Houston, MD;  [facility];  Service: Endoscopy;;  duodeneum and stomach  . CHOLECYSTECTOMY  1980  . ENDARTERECTOMY Right 07/01/2017   Procedure: ENDARTERECTOMY CAROTID RIGHT;  Surgeon: Serafina Mitchell, MD;  Location: [facility] OR;  Service: Vascular;  Laterality: Right;  . ESOPHAGOGASTRODUODENOSCOPY N/A 09/23/2018   Procedure: ESOPHAGOGASTRODUODENOSCOPY (EGD);   Surgeon: Rogene Houston, MD;  [facility];  Service: Endoscopy;  Laterality: N/A;  2:45-moved to 1:45 per Lelon Frohlich  . LEFT HEART CATHETERIZATION WITH CORONARY ANGIOGRAM N/A 11/18/2013   Procedure: LEFT HEART CATHETERIZATION WITH CORONARY ANGIOGRAM;  Surgeon: Burnell Blanks, MD;  Location: [facility] CATH LAB;  Service: Cardiovascular;  Laterality: N/A;  . OOPHORECTOMY     2012 Dr. Adah Perl in [place]  . PATCH ANGIOPLASTY Right 07/01/2017   Procedure: RIGHT CAROTID PATCH ANGIOPLASTY;  Surgeon: Serafina Mitchell, MD;  [facility] OR;  Service: Vascular;  Laterality: Right;  . TONSILLECTOMY    . TOTAL KNEE ARTHROPLASTY Right 09/02/2012   Procedure: TOTAL KNEE ARTHROPLASTY- right;  Surgeon: Ninetta Lights, MD;  Location: [place];  Service: Orthopedics;  Laterality: Right;  . TUBAL LIGATION    . TYMPANOMASTOIDECTOMY Left 04/02/2016   Procedure: LEFT CANAL WALL DOWN TYMPANOMASTOIDECTOMY;  Surgeon: Leta Baptist, MD;  Location: [place];  Service: ENT;  Laterality: Left;  [name] VAGINAL HYSTERECTOMY  1990  . WOUND EXPLORATION Right 07/02/2017   Procedure: RIGHT NECK EXPLORATION WITH INTRAOPERATIVE ULTRASOUND;  Surgeon: Angelia Mould, MD;  Location: [place];  Service: Vascular;  Laterality: Right;    There were no vitals filed for this visit.    Subjective Assessment - 07/18/20 1046    Subjective COVID-19 screen performed prior to patient entering clinic.  The patient presents  to the clinic today with c/o neck pain and headaches.  This has been ongoing for abou a month.  She relates nothing in particular has brought on her pain.  She tried Chiropractic care but it was not helpful.  Tylenol helps decrease her pain.  Her sleep is disturbed by pain.  She reports a low pain-level today and states she took two Tylenol prior to coming to PT.    Pertinent History CAD, right knee surgery, OA, TIA, DM, carotid patch, h/o neck pain and injections.    How long can you stand comfortably? Varies.     Patient Stated Goals Get out of pain and rid self of headaches.    Currently in Pain? Yes    Pain Score 2     Pain Location Neck    Pain Orientation Right;Left    Pain Descriptors / Indicators Aching    Pain Type Acute pain    Pain Onset More than a month ago    Pain Frequency Constant    Aggravating Factors  See above.    Pain Relieving Factors See above.              [facility] PT Assessment - 07/18/20 0001      Assessment   Medical Diagnosis Neck pain.    Referring Provider (PT) Tiffany Lilia Pro    Onset Date/Surgical Date --   ~ one month or more.     Precautions   Precautions None      Restrictions   Weight Bearing Restrictions No      Balance Screen   Has the patient fallen in the past 6 months No    Has the patient had a decrease in activity level because of a fear of falling?  No    Is the patient reluctant to leave their home because of a fear of falling?  No      Home Environment   Living Environment Private residence      Prior Function   Level of Independence Independent      Posture/Postural Control   Posture/Postural Control Postural limitations    Postural Limitations Rounded Shoulders;Forward head      Deep Tendon Reflexes   DTR Assessment Site Biceps;Brachioradialis;Triceps    Biceps DTR 2+    Brachioradialis DTR 2+    Triceps DTR 2+      ROM / Strength   AROM / PROM / Strength AROM;Strength      AROM   Overall AROM Comments Right active cervical rotation is 42 degrees and left is 49 degrees.  Bilateral active sidebending is 10 degrees.      Strength   Overall Strength Comments Normal UE strength.      Palpation   Palpation comment Tender to palpation over patient's bilateral mid-cervical paraspinal musculature.      Ambulation/Gait   Gait Comments WNL.                      Objective measurements completed on examination: See above findings.       [facility] Adult PT Treatment/Exercise - 07/18/20 0001      Modalities    Modalities Moist Heat;Electrical Stimulation      Moist Heat Therapy   Number Minutes Moist Heat 20 Minutes    Moist Heat Location Cervical      Electrical Stimulation   Electrical Stimulation Location Bilateral cervical    Electrical Stimulation Action IFC at 80-150 Hz.    Electrical Stimulation Parameters 40% scan  x 20 minutes.                       PT Long Term Goals - 07/18/20 1111      PT LONG TERM GOAL #1   Title Independent with a HEP.    Time 6    Period Weeks    Status New      PT LONG TERM GOAL #2   Title Increase active cervical rotation to 60-65 degrees+ so patient can turn head more easily while driving.    Time 6    Period Weeks    Status New      PT LONG TERM GOAL #3   Title Eliminate headaches.    Time 6    Period Weeks    Status New                  Plan - 07/18/20 1107    Clinical Impression Statement The patient presents to OPPT with c/o neck pain and headaches that can become quite severe.  Her headaches tend to be on the crown of her head.  Her cervical range of motion is limited.  Her UE DTR's and strength is normal.  She has had injections in the past for neck pain.  Patient will benefit from skilled physical therapy intervention to address pain and deficits.    Personal Factors and Comorbidities Comorbidity 1;Comorbidity 2;Comorbidity 3+;Other    Comorbidities CAD, right knee surgery, OA, TIA, DM, carotid patch, h/o neck pain and injections.    Examination-Activity Limitations Other    Examination-Participation Restrictions Other    Stability/Clinical Decision Making Evolving/Moderate complexity    Clinical Decision Making Low    PT Frequency 2x / week    PT Duration 6 weeks    PT Treatment/Interventions ADLs/Self Care Home Management;Electrical Stimulation;Moist Heat;Therapeutic activities;Therapeutic exercise;Manual techniques;Patient/family education;Passive range of motion;Dry needling    PT Next Visit Plan Combo  e'stim/US, STW/M, gentle manual traction, chin tucks, cervical extension.  postural exercises.    Consulted and Agree with Plan of Care Patient           Patient will benefit from skilled therapeutic intervention in order to improve the following deficits and impairments:  Pain,Decreased activity tolerance,Decreased range of motion,Increased muscle spasms  Visit Diagnosis: Cervicalgia - Plan: PT plan of care cert/re-cert  Abnormal posture - Plan: PT plan of care cert/re-cert     Problem List Patient Active Problem List   Diagnosis Date Noted  . Supraventricular tachycardia ([place]) 06/27/2020  . Angina, class III ([place]) 06/27/2020  . Cirrhosis, nonalcoholic ([place]) 97/67/3419  . Encephalopathy, hepatic ([place]) 03/28/2020  . Cervical spondylosis 06/07/2019  . Cervical radiculitis 05/28/2019  . Peptic ulcer disease 01/25/2019  . Iron deficiency anemia 01/25/2019  . Oral candidiasis 01/25/2019  . Fatty liver disease, nonalcoholic 37/90/2409  . Absolute anemia 08/17/2018  . Cardiomyopathy ([place]) 08/07/2017  . Non-rheumatic mitral regurgitation 08/07/2017  . Non-rheumatic tricuspid valve insufficiency 08/07/2017  . PAD (peripheral artery disease) ([place]) 08/07/2017  . Carotid artery stenosis, symptomatic, right 07/01/2017  . Stenosis of right carotid artery 06/23/2017  . Orthostatic tremor 06/23/2017  . Transient left leg weakness 08/16/2016  . Anxiety state 07/01/2016  . Mild neurocognitive disorder 05/02/2016  . Migraine without aura and without status migrainosus, not intractable 01/27/2015  . Depression 07/25/2014  . Chronic daily headache 12/03/2013  . Transient alteration of awareness 05/25/2013  . Osteoarthritis of right knee 09/03/2012  . Right knee DJD   .  IBS (irritable bowel syndrome)   . Diverticulosis of colon (without mention of hemorrhage)   . GERD (gastroesophageal reflux disease)   . Hypertension   . Hormone replacement therapy (postmenopausal)   . Migraine   .  Seizures ([place])   . Hemorrhoids, external, without mention of complication   . Hemorrhoids, external, without mention of complication   . SHOULDER PAIN 08/24/2008  . IMPINGEMENT SYNDROME 08/24/2008    Totiana Everson, [place] MPT 07/18/2020, 11:13 AM  [facility] [place], [place], [address] Phone: [phone]   Fax:  [phone]  Name: LILYAHNA SIRMON MRN: [id number] Date of Birth: 04/29/1950

## 2020-07-19 DIAGNOSIS — Z79891 Long term (current) use of opiate analgesic: Secondary | ICD-10-CM | POA: Diagnosis not present

## 2020-07-19 DIAGNOSIS — M542 Cervicalgia: Secondary | ICD-10-CM | POA: Diagnosis not present

## 2020-07-19 DIAGNOSIS — Z5181 Encounter for therapeutic drug level monitoring: Secondary | ICD-10-CM | POA: Diagnosis not present

## 2020-07-19 DIAGNOSIS — Z79899 Other long term (current) drug therapy: Secondary | ICD-10-CM | POA: Diagnosis not present

## 2020-07-20 ENCOUNTER — Ambulatory Visit: Payer: Medicare HMO | Attending: Family Medicine

## 2020-07-20 ENCOUNTER — Other Ambulatory Visit: Payer: Self-pay

## 2020-07-20 DIAGNOSIS — R293 Abnormal posture: Secondary | ICD-10-CM | POA: Diagnosis not present

## 2020-07-20 DIAGNOSIS — M542 Cervicalgia: Secondary | ICD-10-CM | POA: Diagnosis not present

## 2020-07-20 NOTE — Therapy (Signed)
[facility] [place], [place], [address] Phone: [phone]   Fax:  [phone]  Physical Therapy Treatment  Patient Details  Name: Carol Richmond MRN: [id number] Date of Birth: 10-05-1950 Referring Provider (PT): Marjorie Smolder   Encounter Date: 07/20/2020   PT End of Session - 07/20/20 1130    Visit Number 2    Number of Visits 12    Date for PT Re-Evaluation 08/29/20    Authorization Type FOTO AT LEAST EVERY 5TH VISIT.  PROGRESS NOTE AT 10TH VISIT.  KX MODIFIER AFTER 15 VISITS.    PT Start Time 1030    PT Stop Time 1135    PT Time Calculation (min) 65 min    Activity Tolerance Patient tolerated treatment well    Behavior During Therapy WFL for tasks assessed/performed           Past Medical History:  Diagnosis Date  . Asthma   . Carotid artery disease ([place])   . Diverticulosis of colon (without mention of hemorrhage)   . Essential hypertension   . Functional ovarian cysts   . GERD (gastroesophageal reflux disease)   . Hemorrhoids, external, without mention of complication   . Hormone replacement therapy (postmenopausal)   . IBS (irritable bowel syndrome)   . Migraine   . Mitral valve disease   . Right knee DJD   . Seizures ([place])   . Sleep apnea   . TIA (transient ischemic attack)   . Type 2 diabetes mellitus ([place])   . Type 2 diabetes mellitus with hyperglycemia, without long-term current use of insulin ([place]) 08/07/2017    Past Surgical History:  Procedure Laterality Date  . BIOPSY  09/23/2018   Procedure: BIOPSY;  Surgeon: Rogene Houston, MD;  [facility];  Service: Endoscopy;;  duodeneum and stomach  . CHOLECYSTECTOMY  1980  . ENDARTERECTOMY Right 07/01/2017   Procedure: ENDARTERECTOMY CAROTID RIGHT;  Surgeon: Serafina Mitchell, MD;  Location: [facility] OR;  Service: Vascular;  Laterality: Right;  . ESOPHAGOGASTRODUODENOSCOPY N/A 09/23/2018   Procedure: ESOPHAGOGASTRODUODENOSCOPY (EGD);  Surgeon:  Rogene Houston, MD;  [facility];  Service: Endoscopy;  Laterality: N/A;  2:45-moved to 1:45 per Lelon Frohlich  . LEFT HEART CATHETERIZATION WITH CORONARY ANGIOGRAM N/A 11/18/2013   Procedure: LEFT HEART CATHETERIZATION WITH CORONARY ANGIOGRAM;  Surgeon: Burnell Blanks, MD;  Location: [facility] CATH LAB;  Service: Cardiovascular;  Laterality: N/A;  . OOPHORECTOMY     2012 Dr. Adah Perl in [place]  . PATCH ANGIOPLASTY Right 07/01/2017   Procedure: RIGHT CAROTID PATCH ANGIOPLASTY;  Surgeon: Serafina Mitchell, MD;  [facility];  Service: Vascular;  Laterality: Right;  . TONSILLECTOMY    . TOTAL KNEE ARTHROPLASTY Right 09/02/2012   Procedure: TOTAL KNEE ARTHROPLASTY- right;  Surgeon: Ninetta Lights, MD;  Location: [place];  Service: Orthopedics;  Laterality: Right;  . TUBAL LIGATION    . TYMPANOMASTOIDECTOMY Left 04/02/2016   Procedure: LEFT CANAL WALL DOWN TYMPANOMASTOIDECTOMY;  Surgeon: Leta Baptist, MD;  Location: [place];  Service: ENT;  Laterality: Left;  [name] VAGINAL HYSTERECTOMY  1990  . WOUND EXPLORATION Right 07/02/2017   Procedure: RIGHT NECK EXPLORATION WITH INTRAOPERATIVE ULTRASOUND;  Surgeon: Angelia Mould, MD;  Location: [place];  Service: Vascular;  Laterality: Right;    There were no vitals filed for this visit.   Subjective Assessment - 07/20/20 1035    Subjective COVID-19 screen performed prior to patient entering clinic.  She reports she is  sore today but using ice or a heat pack has helped a little. She does not like the thought of having her neck twisted quickly/    Pertinent History CAD, right knee surgery, OA, TIA, DM, carotid patch, h/o neck pain and injections.    How long can you stand comfortably? Varies.    Currently in Pain? Yes    Pain Score 2     Pain Location Neck    Pain Orientation Right;Left;Lower    Pain Descriptors / Indicators Aching    Pain Onset More than a month ago                             [facility] Adult PT  Treatment/Exercise - 07/20/20 1030      Exercises   Exercises Neck      Neck Exercises: Seated   Other Seated Exercise Attempted cervical SNAGs - difficult performing - discontinued      Neck Exercises: Supine   Neck Retraction 20 reps    Neck Retraction Limitations Supine with LEs elevated, chin tucks    Other Supine Exercise Cervical rotations x 15 each with UE in abd/er (hands behend head      Neck Exercises: Sidelying   Other Sidelying Exercise Open books x 1 min each side      Electrical Stimulation   Electrical Stimulation Location Bilateral cervical    Electrical Stimulation Action IFC at 80-150 Hz.    Electrical Stimulation Parameters 40% scan x 20 minutes.   [id number]     Manual Therapy   Manual Therapy Passive ROM;Soft tissue mobilization;Joint mobilization    Manual therapy comments improved cerical mobility at C7 - pt requires Vcs to relax and reduce neck guarding/tension wth talking    Joint Mobilization rotation gr II with AROM cx rotation, gentle manual traction provided wit hC4-C7 PA mog gr II    Soft tissue mobilization cervical paraspinal STM    Passive ROM P/ROM cervical rotation and flexion                       PT Long Term Goals - 07/18/20 1111      PT LONG TERM GOAL #1   Title Independent with a HEP.    Time 6    Period Weeks    Status New      PT LONG TERM GOAL #2   Title Increase active cervical rotation to 60-65 degrees+ so patient can turn head more easily while driving.    Time 6    Period Weeks    Status New      PT LONG TERM GOAL #3   Title Eliminate headaches.    Time 6    Period Weeks    Status New                 Plan - 07/20/20 1132    Clinical Impression Statement The patient presents with mild irritacbility of sypmtoms but noticeable guarding on the L side of the neck. Patient with audible cavitations in her TMJ when talking in supine. Tenderness at the cervical paraspinals and C7 region noted. She was able to  attain greater mobility following manual therapy. She performs cervical retractions well with minimal cuing required. Proression of cervicotoracic mobility with open book exercises provided. Skilled PT recommended to continue progressing the patient towards goals and management of symptoms.    Personal Factors and Comorbidities Comorbidity 1;Comorbidity 2;Comorbidity 3+;Other  Comorbidities CAD, right knee surgery, OA, TIA, DM, carotid patch, h/o neck pain and injections.    Examination-Participation Restrictions Other    Stability/Clinical Decision Making Evolving/Moderate complexity    Clinical Decision Making Low    Rehab Potential Good    PT Frequency 2x / week    PT Duration 6 weeks    PT Treatment/Interventions ADLs/Self Care Home Management;Electrical Stimulation;Moist Heat;Therapeutic activities;Therapeutic exercise;Manual techniques;Patient/family education;Passive range of motion;Dry needling    PT Next Visit Plan Combo e'stim/US, STW/M, gentle manual traction, chin tucks, cervical extension.  postural exercises.    Consulted and Agree with Plan of Care Patient           Patient will benefit from skilled therapeutic intervention in order to improve the following deficits and impairments:  Pain,Decreased activity tolerance,Decreased range of motion,Increased muscle spasms  Visit Diagnosis: Cervicalgia  Abnormal posture     Problem List Patient Active Problem List   Diagnosis Date Noted  . Supraventricular tachycardia ([place]) 06/27/2020  . Angina, class III ([place]) 06/27/2020  . Cirrhosis, nonalcoholic ([place]) 01/04/3566  . Encephalopathy, hepatic ([place]) 03/28/2020  . Cervical spondylosis 06/07/2019  . Cervical radiculitis 05/28/2019  . Peptic ulcer disease 01/25/2019  . Iron deficiency anemia 01/25/2019  . Oral candidiasis 01/25/2019  . Fatty liver disease, nonalcoholic 01/41/0301  . Absolute anemia 08/17/2018  . Cardiomyopathy ([place]) 08/07/2017  . Non-rheumatic mitral  regurgitation 08/07/2017  . Non-rheumatic tricuspid valve insufficiency 08/07/2017  . PAD (peripheral artery disease) ([place]) 08/07/2017  . Carotid artery stenosis, symptomatic, right 07/01/2017  . Stenosis of right carotid artery 06/23/2017  . Orthostatic tremor 06/23/2017  . Transient left leg weakness 08/16/2016  . Anxiety state 07/01/2016  . Mild neurocognitive disorder 05/02/2016  . Migraine without aura and without status migrainosus, not intractable 01/27/2015  . Depression 07/25/2014  . Chronic daily headache 12/03/2013  . Transient alteration of awareness 05/25/2013  . Osteoarthritis of right knee 09/03/2012  . Right knee DJD   . IBS (irritable bowel syndrome)   . Diverticulosis of colon (without mention of hemorrhage)   . GERD (gastroesophageal reflux disease)   . Hypertension   . Hormone replacement therapy (postmenopausal)   . Migraine   . Seizures ([place])   . Hemorrhoids, external, without mention of complication   . Hemorrhoids, external, without mention of complication   . SHOULDER PAIN 08/24/2008  . IMPINGEMENT SYNDROME 08/24/2008    [place], DPT  07/20/2020, 11:36 AM  [facility] [address] [place], [place], [address] Phone: [phone]   Fax:  [phone]  Name: Carol Richmond MRN: [id number] Date of Birth: 1950/11/01

## 2020-07-24 ENCOUNTER — Ambulatory Visit: Payer: Medicare HMO

## 2020-07-24 ENCOUNTER — Other Ambulatory Visit: Payer: Self-pay

## 2020-07-24 DIAGNOSIS — R293 Abnormal posture: Secondary | ICD-10-CM | POA: Diagnosis not present

## 2020-07-24 DIAGNOSIS — M542 Cervicalgia: Secondary | ICD-10-CM | POA: Diagnosis not present

## 2020-07-24 NOTE — Therapy (Signed)
[facility] [place], [place], [address] Phone: [phone]   Fax:  [phone]  Physical Therapy Treatment  Patient Details  Name: Carol Richmond MRN: [id number] Date of Birth: 1950-03-21 Referring Provider (PT): Marjorie Smolder   Encounter Date: 07/24/2020   PT End of Session - 07/24/20 7096    Visit Number 3    Number of Visits 12    Date for PT Re-Evaluation 08/29/20    Authorization Type FOTO AT LEAST EVERY 5TH VISIT.  PROGRESS NOTE AT 10TH VISIT.  KX MODIFIER AFTER 15 VISITS.    PT Start Time 1040    PT Stop Time 1129    PT Time Calculation (min) 49 min    Activity Tolerance Patient tolerated treatment well    Behavior During Therapy WFL for tasks assessed/performed           Past Medical History:  Diagnosis Date  . Asthma   . Carotid artery disease ([place])   . Diverticulosis of colon (without mention of hemorrhage)   . Essential hypertension   . Functional ovarian cysts   . GERD (gastroesophageal reflux disease)   . Hemorrhoids, external, without mention of complication   . Hormone replacement therapy (postmenopausal)   . IBS (irritable bowel syndrome)   . Migraine   . Mitral valve disease   . Right knee DJD   . Seizures ([place])   . Sleep apnea   . TIA (transient ischemic attack)   . Type 2 diabetes mellitus ([place])   . Type 2 diabetes mellitus with hyperglycemia, without long-term current use of insulin ([place]) 08/07/2017    Past Surgical History:  Procedure Laterality Date  . BIOPSY  09/23/2018   Procedure: BIOPSY;  Surgeon: Rogene Houston, MD;  [facility];  Service: Endoscopy;;  duodeneum and stomach  . CHOLECYSTECTOMY  1980  . ENDARTERECTOMY Right 07/01/2017   Procedure: ENDARTERECTOMY CAROTID RIGHT;  Surgeon: Serafina Mitchell, MD;  Location: [facility] OR;  Service: Vascular;  Laterality: Right;  . ESOPHAGOGASTRODUODENOSCOPY N/A 09/23/2018   Procedure: ESOPHAGOGASTRODUODENOSCOPY (EGD);  Surgeon:  Rogene Houston, MD;  [facility];  Service: Endoscopy;  Laterality: N/A;  2:45-moved to 1:45 per Lelon Frohlich  . LEFT HEART CATHETERIZATION WITH CORONARY ANGIOGRAM N/A 11/18/2013   Procedure: LEFT HEART CATHETERIZATION WITH CORONARY ANGIOGRAM;  Surgeon: Burnell Blanks, MD;  Location: [facility] CATH LAB;  Service: Cardiovascular;  Laterality: N/A;  . OOPHORECTOMY     2012 Dr. Adah Perl in [place]  . PATCH ANGIOPLASTY Right 07/01/2017   Procedure: RIGHT CAROTID PATCH ANGIOPLASTY;  Surgeon: Serafina Mitchell, MD;  [facility]: [facility] OR;  Service: Vascular;  Laterality: Right;  . TONSILLECTOMY    . TOTAL KNEE ARTHROPLASTY Right 09/02/2012   Procedure: TOTAL KNEE ARTHROPLASTY- right;  Surgeon: Ninetta Lights, MD;  Location: [place];  Service: Orthopedics;  Laterality: Right;  . TUBAL LIGATION    . TYMPANOMASTOIDECTOMY Left 04/02/2016   Procedure: LEFT CANAL WALL DOWN TYMPANOMASTOIDECTOMY;  Surgeon: Leta Baptist, MD;  Location: [place];  Service: ENT;  Laterality: Left;  [name] VAGINAL HYSTERECTOMY  1990  . WOUND EXPLORATION Right 07/02/2017   Procedure: RIGHT NECK EXPLORATION WITH INTRAOPERATIVE ULTRASOUND;  Surgeon: Angelia Mould, MD;  Location: [place];  Service: Vascular;  Laterality: Right;    There were no vitals filed for this visit.   Subjective Assessment - 07/24/20 1137    Subjective COVID-19 screen performed prior to patient entering clinic.  She reports she is  sore today as she had a roughtweekend grieving and being alone in her trailer since her husband passed away. Patient states that she has not been doing her exercises as she misplaced the paper but found it yesterday.    Pertinent History CAD, right knee surgery, OA, TIA, DM, carotid patch, h/o neck pain and injections.    How long can you stand comfortably? Varies.    Patient Stated Goals Get out of pain and rid self of headaches.    Currently in Pain? Yes    Pain Score 3     Pain Location Neck    Pain Onset More than a  month ago                             [facility] Adult PT Treatment/Exercise - 07/24/20 1126      Neck Exercises: Machines for Strengthening   UBE (Upper Arm Bike) 3 mins retro      Neck Exercises: Seated   Cervical Isometrics --   retraction against therapist hands - in seated x 2 mins   Shoulder Shrugs --   3 x 5 with cervical flexion   Other Seated Exercise attempted cervical retraciton - needs multiple veral and tactile cues - intermittent/inconsistent success with performance due to patient forgetting what was asked of her and hard of hearing      Neck Exercises: Supine   Cervical Rotation Both;10 reps      Modalities   Modalities Electrical Stimulation      Electrical Stimulation   Electrical Stimulation Location Bilateral cervical    Electrical Stimulation Action IFC at 80-150Hz     Electrical Stimulation Parameters 40% scan x 15 min      Manual Therapy   Manual Therapy Passive ROM;Soft tissue mobilization;Joint mobilization    Manual therapy comments improved cerical mobility at C7 - pt requires Vcs to relax and reduce neck guarding/tension wth talking    Joint Mobilization rotation gr II with AROM cx rotation, gentle manual traction provided wit hC4-C7 PA mog gr II    Soft tissue mobilization cervical paraspinal STM    Passive ROM P/ROM cervical rotation and flexion                  PT Education - 07/24/20 1143    Education Details Reviewed sidelying open books, seated gentle neck stretches.    Person(s) Educated Patient    Methods Explanation;Demonstration;Verbal cues    Comprehension Verbalized understanding;Returned demonstration;Verbal cues required               PT Long Term Goals - 07/18/20 1111      PT LONG TERM GOAL #1   Title Independent with a HEP.    Time 6    Period Weeks    Status New      PT LONG TERM GOAL #2   Title Increase active cervical rotation to 60-65 degrees+ so patient can turn head more easily while driving.     Time 6    Period Weeks    Status New      PT LONG TERM GOAL #3   Title Eliminate headaches.    Time 6    Period Weeks    Status New                 Plan - 07/24/20 1135    Clinical Impression Statement The patient presents to clinic 10 minutes after scheduled appt. Patient demonstrates increased  tone at the R UT and lateral paraspinals. Observations of patient sidebending in seated noted. Cervical ROM and manual techniques encourages improved mobiltiy and function. Patient highly guarded and requires several verbal cues to achieveve recommended exercise. She demonstrate consitent comprohension with phyical demonstration of exercises. Skilled PT recommended plan of care to continue addressing  cervicalgia signs and symptoms.    Personal Factors and Comorbidities Comorbidity 1;Comorbidity 2;Comorbidity 3+;Other    Comorbidities CAD, right knee surgery, OA, TIA, DM, carotid patch, h/o neck pain and injections.    Examination-Activity Limitations Other    Examination-Participation Restrictions Other    Clinical Decision Making Low    Rehab Potential Good    PT Frequency 2x / week    PT Duration 6 weeks    PT Treatment/Interventions ADLs/Self Care Home Management;Electrical Stimulation;Moist Heat;Therapeutic activities;Therapeutic exercise;Manual techniques;Patient/family education;Passive range of motion;Dry needling    PT Next Visit Plan Combo e'stim/US, STW/M, gentle manual traction, chin tucks, cervical extension.  postural exercises.           Patient will benefit from skilled therapeutic intervention in order to improve the following deficits and impairments:  Pain,Decreased activity tolerance,Decreased range of motion,Increased muscle spasms  Visit Diagnosis: Cervicalgia  Abnormal posture     Problem List Patient Active Problem List   Diagnosis Date Noted  . Supraventricular tachycardia ([place]) 06/27/2020  . Angina, class III ([place]) 06/27/2020  . Cirrhosis,  nonalcoholic ([place]) 28/31/5176  . Encephalopathy, hepatic ([place]) 03/28/2020  . Cervical spondylosis 06/07/2019  . Cervical radiculitis 05/28/2019  . Peptic ulcer disease 01/25/2019  . Iron deficiency anemia 01/25/2019  . Oral candidiasis 01/25/2019  . Fatty liver disease, nonalcoholic 16/08/3708  . Absolute anemia 08/17/2018  . Cardiomyopathy ([place]) 08/07/2017  . Non-rheumatic mitral regurgitation 08/07/2017  . Non-rheumatic tricuspid valve insufficiency 08/07/2017  . PAD (peripheral artery disease) ([place]) 08/07/2017  . Carotid artery stenosis, symptomatic, right 07/01/2017  . Stenosis of right carotid artery 06/23/2017  . Orthostatic tremor 06/23/2017  . Transient left leg weakness 08/16/2016  . Anxiety state 07/01/2016  . Mild neurocognitive disorder 05/02/2016  . Migraine without aura and without status migrainosus, not intractable 01/27/2015  . Depression 07/25/2014  . Chronic daily headache 12/03/2013  . Transient alteration of awareness 05/25/2013  . Osteoarthritis of right knee 09/03/2012  . Right knee DJD   . IBS (irritable bowel syndrome)   . Diverticulosis of colon (without mention of hemorrhage)   . GERD (gastroesophageal reflux disease)   . Hypertension   . Hormone replacement therapy (postmenopausal)   . Migraine   . Seizures ([place])   . Hemorrhoids, external, without mention of complication   . Hemorrhoids, external, without mention of complication   . SHOULDER PAIN 08/24/2008  . IMPINGEMENT SYNDROME 08/24/2008    Marylou Mccoy PT, DPT 07/24/2020, 11:50 AM  [facility] [address] [place], [place], [address] Phone: [phone]   Fax:  [phone]  Name: Carol Richmond MRN: [id number] Date of Birth: [birth year]

## 2020-07-26 ENCOUNTER — Telehealth: Payer: Self-pay | Admitting: Family Medicine

## 2020-07-26 ENCOUNTER — Other Ambulatory Visit: Payer: Self-pay

## 2020-07-26 ENCOUNTER — Ambulatory Visit: Payer: Medicare HMO

## 2020-07-26 DIAGNOSIS — M542 Cervicalgia: Secondary | ICD-10-CM

## 2020-07-26 DIAGNOSIS — R293 Abnormal posture: Secondary | ICD-10-CM | POA: Diagnosis not present

## 2020-07-26 MED ORDER — FUROSEMIDE 40 MG PO TABS: 40.0000 mg | ORAL_TABLET | Freq: Two times a day (BID) | ORAL | 1 refills | Status: DC

## 2020-07-26 NOTE — Telephone Encounter (Signed)
Refill sent over to pharmacy as pt is current with her visits and has had recent CMP. Left message advising pt refill was sent and to call back with any further questions or concerns.

## 2020-07-26 NOTE — Therapy (Signed)
[facility] [place], [place], [address] Phone: [phone]   Fax:  [phone]  Physical Therapy Treatment  Patient Details  Name: Carol Richmond MRN: [id number] Date of Birth: 07/24/1950 Referring Provider (PT): Marjorie Smolder   Encounter Date: 07/26/2020    Past Medical History:  Diagnosis Date  . Asthma   . Carotid artery disease ([place])   . Diverticulosis of colon (without mention of hemorrhage)   . Essential hypertension   . Functional ovarian cysts   . GERD (gastroesophageal reflux disease)   . Hemorrhoids, external, without mention of complication   . Hormone replacement therapy (postmenopausal)   . IBS (irritable bowel syndrome)   . Migraine   . Mitral valve disease   . Right knee DJD   . Seizures ([place])   . Sleep apnea   . TIA (transient ischemic attack)   . Type 2 diabetes mellitus ([place])   . Type 2 diabetes mellitus with hyperglycemia, without long-term current use of insulin ([place]) 08/07/2017    Past Surgical History:  Procedure Laterality Date  . BIOPSY  09/23/2018   Procedure: BIOPSY;  Surgeon: Rogene Houston, MD;  [facility];  Service: Endoscopy;;  duodeneum and stomach  . CHOLECYSTECTOMY  1980  . ENDARTERECTOMY Right 07/01/2017   Procedure: ENDARTERECTOMY CAROTID RIGHT;  Surgeon: Serafina Mitchell, MD;  Location: [facility] OR;  Service: Vascular;  Laterality: Right;  . ESOPHAGOGASTRODUODENOSCOPY N/A 09/23/2018   Procedure: ESOPHAGOGASTRODUODENOSCOPY (EGD);  Surgeon: Rogene Houston, MD;  [facility];  Service: Endoscopy;  Laterality: N/A;  2:45-moved to 1:45 per Lelon Frohlich  . LEFT HEART CATHETERIZATION WITH CORONARY ANGIOGRAM N/A 11/18/2013   Procedure: LEFT HEART CATHETERIZATION WITH CORONARY ANGIOGRAM;  Surgeon: Burnell Blanks, MD;  Location: [facility] CATH LAB;  Service: Cardiovascular;  Laterality: N/A;  . OOPHORECTOMY     2012 Dr. Adah Perl in [place]  . PATCH ANGIOPLASTY Right 07/01/2017    Procedure: RIGHT CAROTID PATCH ANGIOPLASTY;  Surgeon: Serafina Mitchell, MD;  Location: [facility] OR;  Service: Vascular;  Laterality: Right;  . TONSILLECTOMY    . TOTAL KNEE ARTHROPLASTY Right 09/02/2012   Procedure: TOTAL KNEE ARTHROPLASTY- right;  Surgeon: Ninetta Lights, MD;  Location: [place];  Service: Orthopedics;  Laterality: Right;  . TUBAL LIGATION    . TYMPANOMASTOIDECTOMY Left 04/02/2016   Procedure: LEFT CANAL WALL DOWN TYMPANOMASTOIDECTOMY;  Surgeon: Leta Baptist, MD;  Location: [place];  Service: ENT;  Laterality: Left;  [name] VAGINAL HYSTERECTOMY  1990  . WOUND EXPLORATION Right 07/02/2017   Procedure: RIGHT NECK EXPLORATION WITH INTRAOPERATIVE ULTRASOUND;  Surgeon: Angelia Mould, MD;  Location: [place];  Service: Vascular;  Laterality: Right;    There were no vitals filed for this visit.   Subjective Assessment - 07/26/20 1039    Subjective COVID-19 screen performed prior to patient entering clinic.  She reports she is sore today and thinks she would be better if she did not have arthritis in her neck.    Pertinent History CAD, right knee surgery, OA, TIA, DM, carotid patch, h/o neck pain and injections.    Currently in Pain? Yes    Pain Score 2     Pain Location Knee              [facility] PT Assessment - 07/26/20 0001      Cognition   Overall Cognitive Status Difficult to assess    Executive Function Sequencing    Sequencing Impaired  Behaviors Other (comment)   hard of hearing                        [facility] Adult PT Treatment/Exercise - 07/26/20 0001      Neck Exercises: Machines for Strengthening   UBE (Upper Arm Bike) 3 mins retro      Neck Exercises: Standing   Other Standing Exercises Physioball roll up the wall with cervical head turns, seated "open books" x 15 ea; seated scapular retraction x 10; seated Physiball roll out 2 mins; wall squats with ball a tback x 1 min;      Neck Exercises: Supine   Other Supine Exercise cervical  rotation, chin tucks      Manual Therapy   Manual Therapy Passive ROM;Soft tissue mobilization;Joint mobilization    Manual therapy comments improved cerical mobility at C7 - pt requires Vcs to relax and reduce neck guarding/tension wth talking    Joint Mobilization rotation gr II with AROM cx rotation, gentle manual traction provided wit hC4-C7 PA mog gr II    Soft tissue mobilization cervical paraspinal STM    Passive ROM P/ROM cervical rotation and flexion                       PT Long Term Goals - 07/18/20 1111      PT LONG TERM GOAL #1   Title Independent with a HEP.    Time 6    Period Weeks    Status New      PT LONG TERM GOAL #2   Title Increase active cervical rotation to 60-65 degrees+ so patient can turn head more easily while driving.    Time 6    Period Weeks    Status New      PT LONG TERM GOAL #3   Title Eliminate headaches.    Time 6    Period Weeks    Status New                 Plan - 07/26/20 1030    Clinical Impression Statement Patient reports that she is getting a little better and beleives PT is helping. She has difficulty rememebering her exercises but does well once receiving visual cues. Much time spent on repeating demonstration/descrition of exercise d/t patient limited hearing/processing.  Patient presnets with moderate hypertonicity at the L UT region and among scalenes of the region. Exceessive use of accessorty muscles with breathing noted with manual therpay this visit. Skilled PT recommended to continue plan of care to address signs and symptoms.    Personal Factors and Comorbidities Comorbidity 1;Comorbidity 2;Comorbidity 3+;Other    Comorbidities CAD, right knee surgery, OA, TIA, DM, carotid patch, h/o neck pain and injections.    Examination-Activity Limitations Other    Examination-Participation Restrictions Other    Stability/Clinical Decision Making Evolving/Moderate complexity    Clinical Decision Making Low    Rehab  Potential Good    PT Frequency 2x / week    PT Duration 6 weeks    PT Treatment/Interventions ADLs/Self Care Home Management;Electrical Stimulation;Moist Heat;Therapeutic activities;Therapeutic exercise;Manual techniques;Patient/family education;Passive range of motion;Dry needling    PT Next Visit Plan Combo e'stim/US, STW/M, gentle manual traction, chin tucks, cervical extension.  postural exercises.           Patient will benefit from skilled therapeutic intervention in order to improve the following deficits and impairments:  Pain,Decreased activity tolerance,Decreased range of motion,Increased muscle spasms  Visit  Diagnosis: Cervicalgia  Abnormal posture     Problem List Patient Active Problem List   Diagnosis Date Noted  . Supraventricular tachycardia ([place]) 06/27/2020  . Angina, class III ([place]) 06/27/2020  . Cirrhosis, nonalcoholic ([place]) 93/55/2174  . Encephalopathy, hepatic ([place]) 03/28/2020  . Cervical spondylosis 06/07/2019  . Cervical radiculitis 05/28/2019  . Peptic ulcer disease 01/25/2019  . Iron deficiency anemia 01/25/2019  . Oral candidiasis 01/25/2019  . Fatty liver disease, nonalcoholic 71/59/5396  . Absolute anemia 08/17/2018  . Cardiomyopathy ([place]) 08/07/2017  . Non-rheumatic mitral regurgitation 08/07/2017  . Non-rheumatic tricuspid valve insufficiency 08/07/2017  . PAD (peripheral artery disease) ([place]) 08/07/2017  . Carotid artery stenosis, symptomatic, right 07/01/2017  . Stenosis of right carotid artery 06/23/2017  . Orthostatic tremor 06/23/2017  . Transient left leg weakness 08/16/2016  . Anxiety state 07/01/2016  . Mild neurocognitive disorder 05/02/2016  . Migraine without aura and without status migrainosus, not intractable 01/27/2015  . Depression 07/25/2014  . Chronic daily headache 12/03/2013  . Transient alteration of awareness 05/25/2013  . Osteoarthritis of right knee 09/03/2012  . Right knee DJD   . IBS (irritable bowel syndrome)    . Diverticulosis of colon (without mention of hemorrhage)   . GERD (gastroesophageal reflux disease)   . Hypertension   . Hormone replacement therapy (postmenopausal)   . Migraine   . Seizures ([place])   . Hemorrhoids, external, without mention of complication   . Hemorrhoids, external, without mention of complication   . SHOULDER PAIN 08/24/2008  . IMPINGEMENT SYNDROME 08/24/2008    Fults, DPT 07/26/2020, 11:33 AM  [facility] [address] [place], [place], [address] Phone: [phone]   Fax:  [phone]  Name: Carol Richmond MRN: [id number] Date of Birth: 14-Nov-1950

## 2020-07-26 NOTE — Telephone Encounter (Signed)
  Prescription Request  07/26/2020  What is the name of the medication or equipment? furosemide (LASIX) 40 MG tablet  Have you contacted your pharmacy to request a refill? (if applicable) no  Which pharmacy would you like this sent to? [facility]   Patient notified that their request is being sent to the clinical staff for review and that they should receive a response within 2 business days.

## 2020-07-31 ENCOUNTER — Ambulatory Visit: Payer: Medicare HMO

## 2020-08-04 DIAGNOSIS — H905 Unspecified sensorineural hearing loss: Secondary | ICD-10-CM | POA: Diagnosis not present

## 2020-08-07 ENCOUNTER — Ambulatory Visit: Payer: Medicare HMO

## 2020-08-07 ENCOUNTER — Other Ambulatory Visit: Payer: Self-pay

## 2020-08-07 DIAGNOSIS — M542 Cervicalgia: Secondary | ICD-10-CM | POA: Diagnosis not present

## 2020-08-07 DIAGNOSIS — R293 Abnormal posture: Secondary | ICD-10-CM

## 2020-08-07 NOTE — Therapy (Signed)
[facility] [place], [place], [address] Phone: [phone]   Fax:  [phone]  Physical Therapy Treatment  Patient Details  Name: Carol Richmond MRN: [id number] Date of Birth: February 13, 1951 Referring Provider (PT): Marjorie Smolder   Encounter Date: 08/07/2020   PT End of Session - 08/07/20 1153     Visit Number 5    Number of Visits 12    Date for PT Re-Evaluation 08/29/20    Authorization Type FOTO AT LEAST EVERY 5TH VISIT.  PROGRESS NOTE AT 10TH VISIT.  KX MODIFIER AFTER 15 VISITS.    PT Start Time 1035    PT Stop Time 1120    PT Time Calculation (min) 45 min    Activity Tolerance Patient tolerated treatment well    Behavior During Therapy WFL for tasks assessed/performed             Past Medical History:  Diagnosis Date   Asthma    Carotid artery disease ([place])    Diverticulosis of colon (without mention of hemorrhage)    Essential hypertension    Functional ovarian cysts    GERD (gastroesophageal reflux disease)    Hemorrhoids, external, without mention of complication    Hormone replacement therapy (postmenopausal)    IBS (irritable bowel syndrome)    Migraine    Mitral valve disease    Right knee DJD    Seizures (HCC)    Sleep apnea    TIA (transient ischemic attack)    Type 2 diabetes mellitus ([place])    Type 2 diabetes mellitus with hyperglycemia, without long-term current use of insulin ([place]) 08/07/2017    Past Surgical History:  Procedure Laterality Date   BIOPSY  09/23/2018   Procedure: BIOPSY;  Surgeon: Rogene Houston, MD;  [facility];  Service: Endoscopy;;  duodeneum and stomach   CHOLECYSTECTOMY  1980   ENDARTERECTOMY Right 07/01/2017   Procedure: ENDARTERECTOMY CAROTID RIGHT;  Surgeon: Serafina Mitchell, MD;  [facility];  Service: Vascular;  Laterality: Right;   ESOPHAGOGASTRODUODENOSCOPY N/A 09/23/2018   Procedure: ESOPHAGOGASTRODUODENOSCOPY (EGD);  Surgeon: Rogene Houston, MD;  [facility];  Service: Endoscopy;  Laterality: N/A;  2:45-moved to 1:45 per Lelon Frohlich   LEFT HEART CATHETERIZATION WITH CORONARY ANGIOGRAM N/A 11/18/2013   Procedure: LEFT HEART CATHETERIZATION WITH CORONARY ANGIOGRAM;  Surgeon: Burnell Blanks, MD;  Location: [facility] CATH LAB;  Service: Cardiovascular;  Laterality: N/A;   OOPHORECTOMY     2012 Dr. Adah Perl in [place] Right 07/01/2017   Procedure: RIGHT CAROTID PATCH ANGIOPLASTY;  Surgeon: Serafina Mitchell, MD;  Location: [facility];  Service: Vascular;  Laterality: Right;   TONSILLECTOMY     TOTAL KNEE ARTHROPLASTY Right 09/02/2012   Procedure: TOTAL KNEE ARTHROPLASTY- right;  Surgeon: Ninetta Lights, MD;  Location: [facility];  Service: Orthopedics;  Laterality: Right;   TUBAL LIGATION     TYMPANOMASTOIDECTOMY Left 04/02/2016   Procedure: LEFT CANAL WALL DOWN TYMPANOMASTOIDECTOMY;  Surgeon: Leta Baptist, MD;  Location: [place] [facility];  Service: ENT;  Laterality: Left;   [place] Right 07/02/2017   Procedure: RIGHT NECK EXPLORATION WITH INTRAOPERATIVE ULTRASOUND;  Surgeon: Angelia Mould, MD;  Location: [facility];  Service: Vascular;  Laterality: Right;    There were no vitals filed for this visit.   Subjective Assessment - 08/07/20 1035     Subjective COVID-19 screen performed prior to patient entering clinic. She  arrived 5 mins after scheduled appt. Patient states that her neck is still core and she says it is her arthritis paining her.    Pertinent History CAD, right knee surgery, OA, TIA, DM, carotid patch, h/o neck pain and injections.    Patient Stated Goals Get out of pain and rid self of headaches.    Currently in Pain? Yes    Pain Score 4     Pain Location Neck    Pain Orientation Right;Left    Pain Onset More than a month ago                               [facility] Adult PT Treatment/Exercise - 08/07/20 1039       Neck Exercises: Machines  for Strengthening   UBE (Upper Arm Bike) 53mns retro      Neck Exercises: Theraband   Scapula Retraction 15 reps    Scapula Retraction Limitations Red Tband    Shoulder Extension 15 reps    Shoulder Extension Limitations Blue [place] band with head turns      Neck Exercises: Standing   Neck Retraction 10 reps      Neck Exercises: Supine   Capital Flexion 20 reps    Capital Flexion Limitations tactile cues needed    Cervical Rotation Both;15 reps    Other Supine Exercise Side bending with SCM stretch      Manual Therapy   Manual Therapy Passive ROM;Soft tissue mobilization;Joint mobilization    Manual therapy comments less UT tension with PROM and stretches of UT and LS, improved cervical rotation    Joint Mobilization rotation gr II with AROM cx rotation, gentle manual traction provided with C4-C7 PA mob gr II    Soft tissue mobilization cervical paraspinal STM    Passive ROM P/ROM cervical rotation and flexion                    PT Education - 08/07/20 1152     Education Details Reviewed seated neck stretches UT and LS -    Person(s) Educated Patient    Methods Demonstration;Explanation    Comprehension Returned demonstration;Verbal cues required                 PT Long Term Goals - 07/18/20 1111       PT LONG TERM GOAL #1   Title Independent with a HEP.    Time 6    Period Weeks    Status New      PT LONG TERM GOAL #2   Title Increase active cervical rotation to 60-65 degrees+ so patient can turn head more easily while driving.    Time 6    Period Weeks    Status New      PT LONG TERM GOAL #3   Title Eliminate headaches.    Time 6    Period Weeks    Status New                   Plan - 08/07/20 1153     Clinical Impression Statement Patient participates well n PT with extensiobility of the UT and LS improving. SCM hypertonicitiy on the left addressed with PROM this visit resulting in patient increased ease of laying supine with head  in neutral. Continue to address mobility and postural deficiits as plan of care is indicated.    Personal Factors and Comorbidities Comorbidity 1;Comorbidity 2;Comorbidity 3+;Other  Comorbidities CAD, right knee surgery, OA, TIA, DM, carotid patch, h/o neck pain and injections.    Examination-Activity Limitations Other    Examination-Participation Restrictions Other    Stability/Clinical Decision Making Evolving/Moderate complexity    Clinical Decision Making Low    Rehab Potential Good    PT Frequency 2x / week    PT Duration 6 weeks    PT Treatment/Interventions ADLs/Self Care Home Management;Electrical Stimulation;Moist Heat;Therapeutic activities;Therapeutic exercise;Manual techniques;Patient/family education;Passive range of motion;Dry needling    PT Next Visit Plan Combo e'stim/US, STW/M, gentle manual traction, chin tucks, cervical extension.  postural exercises.             Patient will benefit from skilled therapeutic intervention in order to improve the following deficits and impairments:  Pain, Decreased activity tolerance, Decreased range of motion, Increased muscle spasms  Visit Diagnosis: Cervicalgia  Abnormal posture     Problem List Patient Active Problem List   Diagnosis Date Noted   Supraventricular tachycardia ([place]) 06/27/2020   Angina, class III ([place]) 06/27/2020   Cirrhosis, nonalcoholic ([place]) 47/65/4650   Encephalopathy, hepatic ([place]) 03/28/2020   Cervical spondylosis 06/07/2019   Cervical radiculitis 05/28/2019   Peptic ulcer disease 01/25/2019   Iron deficiency anemia 01/25/2019   Oral candidiasis 01/25/2019   Fatty liver disease, nonalcoholic 35/46/5681   Absolute anemia 08/17/2018   Cardiomyopathy ([place]) 08/07/2017   Non-rheumatic mitral regurgitation 08/07/2017   Non-rheumatic tricuspid valve insufficiency 08/07/2017   PAD (peripheral artery disease) ([place]) 08/07/2017   Carotid artery stenosis, symptomatic, right 07/01/2017   Stenosis of right  carotid artery 06/23/2017   Orthostatic tremor 06/23/2017   Transient left leg weakness 08/16/2016   Anxiety state 07/01/2016   Mild neurocognitive disorder 05/02/2016   Migraine without aura and without status migrainosus, not intractable 01/27/2015   Depression 07/25/2014   Chronic daily headache 12/03/2013   Transient alteration of awareness 05/25/2013   Osteoarthritis of right knee 09/03/2012   Right knee DJD    IBS (irritable bowel syndrome)    Diverticulosis of colon (without mention of hemorrhage)    GERD (gastroesophageal reflux disease)    Hypertension    Hormone replacement therapy (postmenopausal)    Migraine    Seizures ([place])    Hemorrhoids, external, without mention of complication    Hemorrhoids, external, without mention of complication    SHOULDER PAIN 08/24/2008   IMPINGEMENT SYNDROME 08/24/2008    Marylou Mccoy PT, DPT 08/07/2020, 11:56 AM  [facility] [address] [place], [place], [address] Phone: [phone]   Fax:  [phone]  Name: Carol Richmond MRN: [id number] Date of Birth: 09/22/1950

## 2020-08-09 ENCOUNTER — Ambulatory Visit: Payer: Medicare HMO

## 2020-08-09 ENCOUNTER — Other Ambulatory Visit: Payer: Self-pay

## 2020-08-09 DIAGNOSIS — M542 Cervicalgia: Secondary | ICD-10-CM | POA: Diagnosis not present

## 2020-08-09 DIAGNOSIS — R293 Abnormal posture: Secondary | ICD-10-CM

## 2020-08-09 NOTE — Therapy (Signed)
[facility] [place], [place], [address] Phone: [phone]   Fax:  [phone]  Physical Therapy Treatment  Patient Details  Name: Carol Richmond MRN: [id number] Date of Birth: October 30, 1950 Referring Provider (PT): Marjorie Smolder   Encounter Date: 08/09/2020   PT End of Session - 08/09/20 1120     Visit Number 6    Number of Visits 12    Date for PT Re-Evaluation 08/29/20    Authorization Type FOTO AT LEAST EVERY 5TH VISIT.  PROGRESS NOTE AT 10TH VISIT.  KX MODIFIER AFTER 15 VISITS.    PT Start Time 1030    PT Stop Time 1130    PT Time Calculation (min) 60 min    Activity Tolerance Patient tolerated treatment well    Behavior During Therapy WFL for tasks assessed/performed             Past Medical History:  Diagnosis Date   Asthma    Carotid artery disease ([place])    Diverticulosis of colon (without mention of hemorrhage)    Essential hypertension    Functional ovarian cysts    GERD (gastroesophageal reflux disease)    Hemorrhoids, external, without mention of complication    Hormone replacement therapy (postmenopausal)    IBS (irritable bowel syndrome)    Migraine    Mitral valve disease    Right knee DJD    Seizures (HCC)    Sleep apnea    TIA (transient ischemic attack)    Type 2 diabetes mellitus ([place])    Type 2 diabetes mellitus with hyperglycemia, without long-term current use of insulin ([place]) 08/07/2017    Past Surgical History:  Procedure Laterality Date   BIOPSY  09/23/2018   Procedure: BIOPSY;  Surgeon: Rogene Houston, MD;  [facility];  Service: Endoscopy;;  duodeneum and stomach   CHOLECYSTECTOMY  1980   ENDARTERECTOMY Right 07/01/2017   Procedure: ENDARTERECTOMY CAROTID RIGHT;  Surgeon: Serafina Mitchell, MD;  [facility];  Service: Vascular;  Laterality: Right;   ESOPHAGOGASTRODUODENOSCOPY N/A 09/23/2018   Procedure: ESOPHAGOGASTRODUODENOSCOPY (EGD);  Surgeon: Rogene Houston, MD;  [facility];  Service: Endoscopy;  Laterality: N/A;  2:45-moved to 1:45 per Lelon Frohlich   LEFT HEART CATHETERIZATION WITH CORONARY ANGIOGRAM N/A 11/18/2013   Procedure: LEFT HEART CATHETERIZATION WITH CORONARY ANGIOGRAM;  Surgeon: Burnell Blanks, MD;  Location: [facility];  Service: Cardiovascular;  Laterality: N/A;   OOPHORECTOMY     2012 Dr. Adah Perl in [place] Right 07/01/2017   Procedure: RIGHT CAROTID PATCH ANGIOPLASTY;  Surgeon: Serafina Mitchell, MD;  Location: [place] [facility];  Service: Vascular;  Laterality: Right;   TONSILLECTOMY     TOTAL KNEE ARTHROPLASTY Right 09/02/2012   Procedure: TOTAL KNEE ARTHROPLASTY- right;  Surgeon: Ninetta Lights, MD;  Location: [place] [facility];  Service: Orthopedics;  Laterality: Right;   TUBAL LIGATION     TYMPANOMASTOIDECTOMY Left 04/02/2016   Procedure: LEFT CANAL WALL DOWN TYMPANOMASTOIDECTOMY;  Surgeon: Leta Baptist, MD;  Location: [facility];  Service: ENT;  Laterality: Left;   [place] Right 07/02/2017   Procedure: RIGHT NECK EXPLORATION WITH INTRAOPERATIVE ULTRASOUND;  Surgeon: Angelia Mould, MD;  Location: [facility];  Service: Vascular;  Laterality: Right;    There were no vitals filed for this visit.   Subjective Assessment - 08/09/20 1112     Subjective COVID-19 screen performed prior to patient entering clinic.She states  that she is achy today at the neck. She said she plans on getting hearing aids soon.    Pertinent History CAD, right knee surgery, OA, TIA, DM, carotid patch, h/o neck pain and injections.    Patient Stated Goals Get out of pain and rid headaches.    Currently in Pain? Yes    Pain Score 3     Pain Location Neck    Pain Orientation Left;Upper    Pain Descriptors / Indicators Aching    Pain Onset More than a month ago                [facility] PT Assessment - 08/09/20 0001       Observation/Other Assessments   Focus on Therapeutic Outcomes (FOTO)   30% limited   Initial 07/18/20: 49% limited                          [facility] Adult PT Treatment/Exercise - 08/09/20 1030       Exercises   Exercises Neck      Neck Exercises: Theraband   Scapula Retraction 20 reps      Neck Exercises: Seated   Cervical Isometrics Flexion;Extension;Right lateral flexion;Left lateral flexion;10 secs;5 reps    Neck Retraction 10 secs;5 reps    Cervical Rotation Both;20 reps    X to V 15 reps    X to V Limitations with cervical flexion    Shoulder Rolls Forwards;15 reps    Shoulder Flexion Both;15 reps    Shoulder ABduction Both;15 reps    Postural Training cuing and repeated erect posture in seated    Other Seated Exercise thoracic rotation - elbow to pockets    Other Seated Exercise levator scapula stretch      Modalities   Modalities Electrical Stimulation      Electrical Stimulation   Electrical Stimulation Location Bilateral cervical    Electrical Stimulation Action IFC 80-150Hz     Electrical Stimulation Parameters 40% scan x 20    Electrical Stimulation Goals Tone;Pain      Manual Therapy   Manual Therapy Passive ROM;Soft tissue mobilization;Joint mobilization    Manual therapy comments less UT tension with PROM and stretches of UT and LS, improved cervical rotation    Soft tissue mobilization cervical paraspinal STM                         PT Long Term Goals - 07/18/20 1111       PT LONG TERM GOAL #1   Title Independent with a HEP.    Time 6    Period Weeks    Status New      PT LONG TERM GOAL #2   Title Increase active cervical rotation to 60-65 degrees+ so patient can turn head more easily while driving.    Time 6    Period Weeks    Status New      PT LONG TERM GOAL #3   Title Eliminate headaches.    Time 6    Period Weeks    Status New                   Plan - 08/09/20 1150     Clinical Impression Statement Patient participates well in PT today with minimally irritable  symptoms. She improved today with cervical rotation, denying any pain of discomfort. She states that she felt a few pops and it gave her relief in  the neck. She requires ample VCs and visual cues dur to hard of hearing deficits. HEP provided with written handout, however she demonstrates limited trasferrance when assessed for independence- she continue to require VCs with all exercises. Supervision required to ensure patient maintains safety with activities. Normal respons to e-stim noted after session. Skilled PT recommended to continue at this time    Personal Factors and Comorbidities Comorbidity 1;Comorbidity 2;Comorbidity 3+;Other    Comorbidities CAD, right knee surgery, OA, TIA, DM, carotid patch, h/o neck pain and injections.    Examination-Activity Limitations Other    Examination-Participation Restrictions Other    Stability/Clinical Decision Making Evolving/Moderate complexity    Clinical Decision Making Low    Rehab Potential Good    PT Frequency 2x / week    PT Duration 6 weeks    PT Treatment/Interventions ADLs/Self Care Home Management;Electrical Stimulation;Moist Heat;Therapeutic activities;Therapeutic exercise;Manual techniques;Patient/family education;Passive range of motion;Dry needling    PT Next Visit Plan Combo e'stim/US, STW/M, gentle manual traction, chin tucks, cervical extension.  postural exercises.             Patient will benefit from skilled therapeutic intervention in order to improve the following deficits and impairments:  Pain, Decreased activity tolerance, Decreased range of motion, Increased muscle spasms  Visit Diagnosis: Cervicalgia  Abnormal posture     Problem List Patient Active Problem List   Diagnosis Date Noted   Supraventricular tachycardia ([place]) 06/27/2020   Angina, class III ([place]) 06/27/2020   Cirrhosis, nonalcoholic ([place]) 94/49/6759   Encephalopathy, hepatic ([place]) 03/28/2020   Cervical spondylosis 06/07/2019   Cervical radiculitis  05/28/2019   Peptic ulcer disease 01/25/2019   Iron deficiency anemia 01/25/2019   Oral candidiasis 01/25/2019   Fatty liver disease, nonalcoholic 16/38/4665   Absolute anemia 08/17/2018   Cardiomyopathy ([place]) 08/07/2017   Non-rheumatic mitral regurgitation 08/07/2017   Non-rheumatic tricuspid valve insufficiency 08/07/2017   PAD (peripheral artery disease) ([place]) 08/07/2017   Carotid artery stenosis, symptomatic, right 07/01/2017   Stenosis of right carotid artery 06/23/2017   Orthostatic tremor 06/23/2017   Transient left leg weakness 08/16/2016   Anxiety state 07/01/2016   Mild neurocognitive disorder 05/02/2016   Migraine without aura and without status migrainosus, not intractable 01/27/2015   Depression 07/25/2014   Chronic daily headache 12/03/2013   Transient alteration of awareness 05/25/2013   Osteoarthritis of right knee 09/03/2012   Right knee DJD    IBS (irritable bowel syndrome)    Diverticulosis of colon (without mention of hemorrhage)    GERD (gastroesophageal reflux disease)    Hypertension    Hormone replacement therapy (postmenopausal)    Migraine    Seizures ([place])    Hemorrhoids, external, without mention of complication    Hemorrhoids, external, without mention of complication    SHOULDER PAIN 08/24/2008   IMPINGEMENT SYNDROME 08/24/2008    Marylou Mccoy PT, DPT 08/09/2020, 11:54 AM  [facility] [address] [place], [place], [address] Phone: [phone]   Fax:  [phone]  Name: Carol Richmond MRN: [id number] Date of Birth: Feb 07, 1951

## 2020-08-11 ENCOUNTER — Other Ambulatory Visit: Payer: Self-pay

## 2020-08-11 ENCOUNTER — Ambulatory Visit: Payer: Medicare HMO

## 2020-08-11 DIAGNOSIS — M542 Cervicalgia: Secondary | ICD-10-CM | POA: Diagnosis not present

## 2020-08-11 DIAGNOSIS — R293 Abnormal posture: Secondary | ICD-10-CM

## 2020-08-11 NOTE — Therapy (Signed)
[facility] [place], [place], [address] Phone: [phone]   Fax:  [phone]  Physical Therapy Treatment  Patient Details  Name: Carol Richmond MRN: [id number] Date of Birth: 11-11-1950 Referring Provider (PT): Marjorie Smolder   Encounter Date: 08/11/2020   PT End of Session - 08/11/20 1133     Visit Number 7    Number of Visits 12    Date for PT Re-Evaluation 08/29/20    PT Start Time 0900    PT Stop Time 0945    PT Time Calculation (min) 45 min    Activity Tolerance Patient tolerated treatment well    Behavior During Therapy [facility] for tasks assessed/performed             Past Medical History:  Diagnosis Date   Asthma    Carotid artery disease ([place])    Diverticulosis of colon (without mention of hemorrhage)    Essential hypertension    Functional ovarian cysts    GERD (gastroesophageal reflux disease)    Hemorrhoids, external, without mention of complication    Hormone replacement therapy (postmenopausal)    IBS (irritable bowel syndrome)    Migraine    Mitral valve disease    Right knee DJD    Seizures (HCC)    Sleep apnea    TIA (transient ischemic attack)    Type 2 diabetes mellitus ([place])    Type 2 diabetes mellitus with hyperglycemia, without long-term current use of insulin ([place]) 08/07/2017    Past Surgical History:  Procedure Laterality Date   BIOPSY  09/23/2018   Procedure: BIOPSY;  Surgeon: Rogene Houston, MD;  [facility];  Service: Endoscopy;;  duodeneum and stomach   CHOLECYSTECTOMY  1980   ENDARTERECTOMY Right 07/01/2017   Procedure: ENDARTERECTOMY CAROTID RIGHT;  Surgeon: Serafina Mitchell, MD;  [facility];  Service: Vascular;  Laterality: Right;   ESOPHAGOGASTRODUODENOSCOPY N/A 09/23/2018   Procedure: ESOPHAGOGASTRODUODENOSCOPY (EGD);  Surgeon: Rogene Houston, MD;  [facility];  Service: Endoscopy;  Laterality: N/A;  2:45-moved to 1:45 per Lelon Frohlich   LEFT HEART  CATHETERIZATION WITH CORONARY ANGIOGRAM N/A 11/18/2013   Procedure: LEFT HEART CATHETERIZATION WITH CORONARY ANGIOGRAM;  Surgeon: Burnell Blanks, MD;  Location: [facility]ATH LAB;  Service: Cardiovascular;  Laterality: N/A;   OOPHORECTOMY     2012 Dr. Adah Perl in [place] Right 07/01/2017   Procedure: RIGHT CAROTID PATCH ANGIOPLASTY;  Surgeon: Serafina Mitchell, MD;  Location: [place];  Service: Vascular;  Laterality: Right;   TONSILLECTOMY     TOTAL KNEE ARTHROPLASTY Right 09/02/2012   Procedure: TOTAL KNEE ARTHROPLASTY- right;  Surgeon: Ninetta Lights, MD;  Location: [place];  Service: Orthopedics;  Laterality: Right;   TUBAL LIGATION     TYMPANOMASTOIDECTOMY Left 04/02/2016   Procedure: LEFT CANAL WALL DOWN TYMPANOMASTOIDECTOMY;  Surgeon: Leta Baptist, MD;  Location: [place];  Service: ENT;  Laterality: Left;   [place] Right 07/02/2017   Procedure: RIGHT NECK EXPLORATION WITH INTRAOPERATIVE ULTRASOUND;  Surgeon: Angelia Mould, MD;  Location: [place];  Service: Vascular;  Laterality: Right;    There were no vitals filed for this visit.   Subjective Assessment - 08/11/20 1128     Subjective COVID-19 screen performed prior to patient entering clinic.She states that she has a soreness in her neck but she has been trying to do her exercises at home.    Pertinent  History CAD, right knee surgery, OA, TIA, DM, carotid patch, h/o neck pain and injections.                               [facility] Adult PT Treatment/Exercise - 08/11/20 0900       Exercises   Exercises Neck      Neck Exercises: Seated   Cervical Isometrics Flexion;Extension;Right lateral flexion;Left lateral flexion;10 secs;5 reps    Neck Retraction 10 secs;5 reps    Postural Training significant verbal and visual cuing  needed for activity and repeated erect posture in seated    Other Seated Exercise levator scapula stretch      Neck  Exercises: Supine   Other Supine Exercise Side bending with SCM stretch      Modalities   Modalities Traction      Traction   Type of Traction Cervical    Min (lbs) 5    Max (lbs) 16    Hold Time 99    Rest Time 5    Time 32mns                         PT Long Term Goals - 07/18/20 1111       PT LONG TERM GOAL #1   Title Independent with a HEP.    Time 6    Period Weeks    Status New      PT LONG TERM GOAL #2   Title Increase active cervical rotation to 60-65 degrees+ so patient can turn head more easily while driving.    Time 6    Period Weeks    Status New      PT LONG TERM GOAL #3   Title Eliminate headaches.    Time 6    Period Weeks    Status New                   Plan - 08/11/20 1134     Clinical Impression Statement Patient with improving independence with HEP with ability to teachback seated levator scapula stretch as well as improving repeated cervical isometrics with less pain or discomfort. Patient with normal response to modalities. Skilled PT recommended to conitnue at this time.    Personal Factors and Comorbidities Comorbidity 1;Comorbidity 2;Comorbidity 3+;Other    Comorbidities CAD, right knee surgery, OA, TIA, DM, carotid patch, h/o neck pain and injections.    Examination-Activity Limitations Other    Examination-Participation Restrictions Other    Stability/Clinical Decision Making Evolving/Moderate complexity    Clinical Decision Making Low    Rehab Potential Good    PT Frequency 2x / week    PT Duration 6 weeks    PT Treatment/Interventions ADLs/Self Care Home Management;Electrical Stimulation;Moist Heat;Therapeutic activities;Therapeutic exercise;Manual techniques;Patient/family education;Passive range of motion;Dry needling    PT Next Visit Plan Combo e'stim/US, STW/M, gentle manual traction, chin tucks, cervical extension.  postural exercises.             Patient will benefit from skilled therapeutic  intervention in order to improve the following deficits and impairments:  Pain, Decreased activity tolerance, Decreased range of motion, Increased muscle spasms  Visit Diagnosis: Cervicalgia  Abnormal posture     Problem List Patient Active Problem List   Diagnosis Date Noted   Supraventricular tachycardia ([place] 06/27/2020   Angina, class III ([place] 06/27/2020   Cirrhosis, nonalcoholic ([place] 000/37/0488  Encephalopathy, hepatic ([place] 03/28/2020  Cervical spondylosis 06/07/2019   Cervical radiculitis 05/28/2019   Peptic ulcer disease 01/25/2019   Iron deficiency anemia 01/25/2019   Oral candidiasis 01/25/2019   Fatty liver disease, nonalcoholic 12/75/1700   Absolute anemia 08/17/2018   Cardiomyopathy ([place]) 08/07/2017   Non-rheumatic mitral regurgitation 08/07/2017   Non-rheumatic tricuspid valve insufficiency 08/07/2017   PAD (peripheral artery disease) ([place]) 08/07/2017   Carotid artery stenosis, symptomatic, right 07/01/2017   Stenosis of right carotid artery 06/23/2017   Orthostatic tremor 06/23/2017   Transient left leg weakness 08/16/2016   Anxiety state 07/01/2016   Mild neurocognitive disorder 05/02/2016   Migraine without aura and without status migrainosus, not intractable 01/27/2015   Depression 07/25/2014   Chronic daily headache 12/03/2013   Transient alteration of awareness 05/25/2013   Osteoarthritis of right knee 09/03/2012   Right knee DJD    IBS (irritable bowel syndrome)    Diverticulosis of colon (without mention of hemorrhage)    GERD (gastroesophageal reflux disease)    Hypertension    Hormone replacement therapy (postmenopausal)    Migraine    Seizures ([place])    Hemorrhoids, external, without mention of complication    Hemorrhoids, external, without mention of complication    SHOULDER PAIN 08/24/2008   IMPINGEMENT SYNDROME 08/24/2008    Marylou Mccoy PT, DPT 08/11/2020, 11:37 AM  [facility] [address] [place], [place], [address] Phone: [phone]   Fax:  [phone]  Name: Carol Richmond MRN: [id number] Date of Birth: 1951-01-05

## 2020-08-14 ENCOUNTER — Ambulatory Visit: Payer: Medicare HMO

## 2020-08-14 ENCOUNTER — Other Ambulatory Visit: Payer: Self-pay

## 2020-08-14 DIAGNOSIS — M542 Cervicalgia: Secondary | ICD-10-CM

## 2020-08-14 DIAGNOSIS — R293 Abnormal posture: Secondary | ICD-10-CM

## 2020-08-14 NOTE — Therapy (Signed)
[facility] [place], [place], [address] Phone: [phone]   Fax:  [phone]  Physical Therapy Treatment  Patient Details  Name: Carol Richmond MRN: [id number] Date of Birth: 05-Feb-1951 Referring Provider (PT): Marjorie Smolder   Encounter Date: 08/14/2020   PT End of Session - 08/14/20 1019     Visit Number 8    Number of Visits 12    Date for PT Re-Evaluation 08/29/20    Authorization Type FOTO AT LEAST EVERY 5TH VISIT.  PROGRESS NOTE AT 10TH VISIT.  KX MODIFIER AFTER 15 VISITS.    PT Start Time 0945    Activity Tolerance Patient tolerated treatment well    Behavior During Therapy WFL for tasks assessed/performed             Past Medical History:  Diagnosis Date   Asthma    Carotid artery disease ([place])    Diverticulosis of colon (without mention of hemorrhage)    Essential hypertension    Functional ovarian cysts    GERD (gastroesophageal reflux disease)    Hemorrhoids, external, without mention of complication    Hormone replacement therapy (postmenopausal)    IBS (irritable bowel syndrome)    Migraine    Mitral valve disease    Right knee DJD    Seizures (HCC)    Sleep apnea    TIA (transient ischemic attack)    Type 2 diabetes mellitus ([place])    Type 2 diabetes mellitus with hyperglycemia, without long-term current use of insulin ([place]) 08/07/2017    Past Surgical History:  Procedure Laterality Date   BIOPSY  09/23/2018   Procedure: BIOPSY;  Surgeon: Rogene Houston, MD;  [facility];  Service: Endoscopy;;  duodeneum and stomach   CHOLECYSTECTOMY  1980   ENDARTERECTOMY Right 07/01/2017   Procedure: ENDARTERECTOMY CAROTID RIGHT;  Surgeon: Serafina Mitchell, MD;  [facility];  Service: Vascular;  Laterality: Right;   ESOPHAGOGASTRODUODENOSCOPY N/A 09/23/2018   Procedure: ESOPHAGOGASTRODUODENOSCOPY (EGD);  Surgeon: Rogene Houston, MD;  [facility];  Service: Endoscopy;   Laterality: N/A;  2:45-moved to 1:45 per Lelon Frohlich   LEFT HEART CATHETERIZATION WITH CORONARY ANGIOGRAM N/A 11/18/2013   Procedure: LEFT HEART CATHETERIZATION WITH CORONARY ANGIOGRAM;  Surgeon: Burnell Blanks, MD;  Location: [facility] CATH LAB;  Service: Cardiovascular;  Laterality: N/A;   OOPHORECTOMY     2012 Dr. Adah Perl in [place] Right 07/01/2017   Procedure: RIGHT CAROTID PATCH ANGIOPLASTY;  Surgeon: Serafina Mitchell, MD;  Location: [place];  Service: Vascular;  Laterality: Right;   TONSILLECTOMY     TOTAL KNEE ARTHROPLASTY Right 09/02/2012   Procedure: TOTAL KNEE ARTHROPLASTY- right;  Surgeon: Ninetta Lights, MD;  Location: [place];  Service: Orthopedics;  Laterality: Right;   TUBAL LIGATION     TYMPANOMASTOIDECTOMY Left 04/02/2016   Procedure: LEFT CANAL WALL DOWN TYMPANOMASTOIDECTOMY;  Surgeon: Leta Baptist, MD;  Location: [place];  Service: ENT;  Laterality: Left;   [place] Right 07/02/2017   Procedure: RIGHT NECK EXPLORATION WITH INTRAOPERATIVE ULTRASOUND;  Surgeon: Angelia Mould, MD;  Location: [place];  Service: Vascular;  Laterality: Right;    There were no vitals filed for this visit.   Subjective Assessment - 08/14/20 1011     Subjective COVID-19 screen performed prior to patient entering clinic.She states that she felt good after last session but the ache on the L side of her  neck came back. She feels discouraged that things are not going to change because of her arthritis.    Pertinent History CAD, right knee surgery, OA, TIA, DM, carotid patch, h/o neck pain and injections.    Patient Stated Goals Get out of pain and rid headaches.    Currently in Pain? Yes    Pain Score 2     Pain Onset More than a month ago                               [facility] Adult PT Treatment/Exercise - 08/14/20 0945       Exercises   Exercises Neck      Neck Exercises: Machines for Strengthening   UBE  (Upper Arm Bike) 26mns retro ; 4 mins fwd; 2 mins retro (icreased intensity) 2 mins fwd      Neck Exercises: Theraband   Scapula Retraction 15 reps   with physiobal hugs   Shoulder Extension 15 reps    Shoulder Extension Limitations Blue Cook band with head turns      Neck Exercises: Standing   Neck Retraction 10 reps    Other Standing Exercises Physioball roll up the wall with cervical head turns, seated "open books" x 15 ea; with green tband      Neck Exercises: Supine   Neck Retraction 20 reps      Modalities   Modalities Traction      Traction   Type of Traction Cervical    Min (lbs) 5    Max (lbs) 17    Hold Time 99    Time 15 mins                         PT Long Term Goals - 07/18/20 1111       PT LONG TERM GOAL #1   Title Independent with a HEP.    Time 6    Period Weeks    Status New      PT LONG TERM GOAL #2   Title Increase active cervical rotation to 60-65 degrees+ so patient can turn head more easily while driving.    Time 6    Period Weeks    Status New      PT LONG TERM GOAL #3   Title Eliminate headaches.    Time 6    Period Weeks    Status New                   Plan - 08/14/20 1019     Clinical Impression Statement Patient did great with this session,  needing less verbal cues for cervical rotation and scapular movment into retraction. Less shoulder elevation noted with head turns this visit indicating a decrease in overrecruited levator scapula and upper trapezius.. Plan to continue with plan of care at this time.    Personal Factors and Comorbidities Comorbidity 1;Comorbidity 2;Comorbidity 3+;Other    Comorbidities CAD, right knee surgery, OA, TIA, DM, carotid patch, h/o neck pain and injections.    Examination-Activity Limitations Other    Examination-Participation Restrictions Other    Stability/Clinical Decision Making Evolving/Moderate complexity    Clinical Decision Making Low    PT Frequency 2x / week    PT  Duration 6 weeks    PT Treatment/Interventions ADLs/Self Care Home Management;Electrical Stimulation;Moist Heat;Therapeutic activities;Therapeutic exercise;Manual techniques;Patient/family education;Passive range of motion;Dry needling    PT Next Visit Plan  reassessment    Consulted and Agree with Plan of Care Patient             Patient will benefit from skilled therapeutic intervention in order to improve the following deficits and impairments:  Pain, Decreased activity tolerance, Decreased range of motion, Increased muscle spasms  Visit Diagnosis: Cervicalgia  Abnormal posture     Problem List Patient Active Problem List   Diagnosis Date Noted   Supraventricular tachycardia ([place]) 06/27/2020   Angina, class III ([place]) 06/27/2020   Cirrhosis, nonalcoholic ([place]) 24/23/5361   Encephalopathy, hepatic ([place]) 03/28/2020   Cervical spondylosis 06/07/2019   Cervical radiculitis 05/28/2019   Peptic ulcer disease 01/25/2019   Iron deficiency anemia 01/25/2019   Oral candidiasis 01/25/2019   Fatty liver disease, nonalcoholic 44/31/5400   Absolute anemia 08/17/2018   Cardiomyopathy ([place]) 08/07/2017   Non-rheumatic mitral regurgitation 08/07/2017   Non-rheumatic tricuspid valve insufficiency 08/07/2017   PAD (peripheral artery disease) ([place]) 08/07/2017   Carotid artery stenosis, symptomatic, right 07/01/2017   Stenosis of right carotid artery 06/23/2017   Orthostatic tremor 06/23/2017   Transient left leg weakness 08/16/2016   Anxiety state 07/01/2016   Mild neurocognitive disorder 05/02/2016   Migraine without aura and without status migrainosus, not intractable 01/27/2015   Depression 07/25/2014   Chronic daily headache 12/03/2013   Transient alteration of awareness 05/25/2013   Osteoarthritis of right knee 09/03/2012   Right knee DJD    IBS (irritable bowel syndrome)    Diverticulosis of colon (without mention of hemorrhage)    GERD (gastroesophageal reflux disease)     Hypertension    Hormone replacement therapy (postmenopausal)    Migraine    Seizures ([place])    Hemorrhoids, external, without mention of complication    Hemorrhoids, external, without mention of complication    SHOULDER PAIN 08/24/2008   IMPINGEMENT SYNDROME 08/24/2008    Marylou Mccoy PT, DPT 08/14/2020, 11:59 AM  [facility] [address] [place], [place], [address] Phone: [phone]   Fax:  [phone]  Name: Carol Richmond MRN: [id number] Date of Birth: [birth year]

## 2020-08-16 ENCOUNTER — Other Ambulatory Visit: Payer: Self-pay

## 2020-08-16 ENCOUNTER — Ambulatory Visit: Payer: Medicare HMO | Admitting: Physical Therapy

## 2020-08-16 DIAGNOSIS — M542 Cervicalgia: Secondary | ICD-10-CM

## 2020-08-16 DIAGNOSIS — R293 Abnormal posture: Secondary | ICD-10-CM

## 2020-08-16 NOTE — Therapy (Signed)
[facility] [place], [place], [address] Phone: [phone]   Fax:  [phone]  Physical Therapy Treatment  Patient Details  Name: Carol Richmond MRN: [id number] Date of Birth: 1950-08-29 Referring Provider (PT): Marjorie Smolder   Encounter Date: 08/16/2020   PT End of Session - 08/16/20 1111     Visit Number 9    Number of Visits 12    Date for PT Re-Evaluation 08/29/20    Authorization Type FOTO AT LEAST EVERY 5TH VISIT.  PROGRESS NOTE AT 10TH VISIT.  KX MODIFIER AFTER 15 VISITS.    PT Start Time 0945    PT Stop Time 1032    PT Time Calculation (min) 47 min    Activity Tolerance Patient tolerated treatment well    Behavior During Therapy WFL for tasks assessed/performed             Past Medical History:  Diagnosis Date   Asthma    Carotid artery disease ([place])    Diverticulosis of colon (without mention of hemorrhage)    Essential hypertension    Functional ovarian cysts    GERD (gastroesophageal reflux disease)    Hemorrhoids, external, without mention of complication    Hormone replacement therapy (postmenopausal)    IBS (irritable bowel syndrome)    Migraine    Mitral valve disease    Right knee DJD    Seizures (HCC)    Sleep apnea    TIA (transient ischemic attack)    Type 2 diabetes mellitus ([place])    Type 2 diabetes mellitus with hyperglycemia, without long-term current use of insulin ([place]) 08/07/2017    Past Surgical History:  Procedure Laterality Date   BIOPSY  09/23/2018   Procedure: BIOPSY;  Surgeon: Rogene Houston, MD;  [facility];  Service: Endoscopy;;  duodeneum and stomach   CHOLECYSTECTOMY  1980   ENDARTERECTOMY Right 07/01/2017   Procedure: ENDARTERECTOMY CAROTID RIGHT;  Surgeon: Serafina Mitchell, MD;  [facility];  Service: Vascular;  Laterality: Right;   ESOPHAGOGASTRODUODENOSCOPY N/A 09/23/2018   Procedure: ESOPHAGOGASTRODUODENOSCOPY (EGD);  Surgeon: Rogene Houston, MD;  [facility];  Service: Endoscopy;  Laterality: N/A;  2:45-moved to 1:45 per Lelon Frohlich   LEFT HEART CATHETERIZATION WITH CORONARY ANGIOGRAM N/A 11/18/2013   Procedure: LEFT HEART CATHETERIZATION WITH CORONARY ANGIOGRAM;  Surgeon: Burnell Blanks, MD;  Location[facility] CATH LAB;  Service: Cardiovascular;  Laterality: N/A;   OOPHORECTOMY     2012 Dr. Adah Perl in [place] Right 07/01/2017   Procedure: RIGHT CAROTID PATCH ANGIOPLASTY;  Surgeon: Serafina Mitchell, MD;  Location: [facility] [place];  Service: Vascular;  Laterality: Right;   TONSILLECTOMY     TOTAL KNEE ARTHROPLASTY Right 09/02/2012   Procedure: TOTAL KNEE ARTHROPLASTY- right;  Surgeon: Ninetta Lights, MD;  Location: [place];  Service: Orthopedics;  Laterality: Right;   TUBAL LIGATION     TYMPANOMASTOIDECTOMY Left 04/02/2016   Procedure: LEFT CANAL WALL DOWN TYMPANOMASTOIDECTOMY;  Surgeon: Leta Baptist, MD;  Location: [place];  Service: ENT;  Laterality: Left;   [place] Right 07/02/2017   Procedure: RIGHT NECK EXPLORATION WITH INTRAOPERATIVE ULTRASOUND;  Surgeon: Angelia Mould, MD;  Location: [place];  Service: Vascular;  Laterality: Right;    There were no vitals filed for this visit.   Subjective Assessment - 08/16/20 1111     Subjective COVID-19 screen performed prior to patient entering clinic.  Doing okay.    Pertinent History CAD, right knee surgery, OA, TIA, DM, carotid patch, h/o neck pain and injections.    How long can you stand comfortably? Varies.    Patient Stated Goals Get out of pain and rid headaches.    Currently in Pain? Yes    Pain Score 3     Pain Location Neck    Pain Orientation Right;Left    Pain Descriptors / Indicators Aching    Pain Type Acute pain    Pain Onset More than a month ago                               [facility] Adult PT Treatment/Exercise - 08/16/20 0001       Modalities   Modalities  Traction;Ultrasound      Ultrasound   Ultrasound Location Bilateral cervical region.    Ultrasound Parameters Combo e'stim/US at 1.50 W/CM2 x 12 minutes.      Traction   Type of Traction Cervical    Min (lbs) 5    Max (lbs) 18    Hold Time 99    Rest Time 5    Time 15 minutes.      Manual Therapy   Soft tissue mobilization STW/M x 11 minutes to patient's bilateral cervical musculature to reduce tone and pain.                         PT Long Term Goals - 07/18/20 1111       PT LONG TERM GOAL #1   Title Independent with a HEP.    Time 6    Period Weeks    Status New      PT LONG TERM GOAL #2   Title Increase active cervical rotation to 60-65 degrees+ so patient can turn head more easily while driving.    Time 6    Period Weeks    Status New      PT LONG TERM GOAL #3   Title Eliminate headaches.    Time 6    Period Weeks    Status New                   Plan - 08/16/20 1117     Clinical Impression Statement Patient did well with treatment today.  She had notable tone over her left cervical paraspinal muculature.  She did great with STW/M.  Provided her with a dry needling handout should she want to proceed with this.    Personal Factors and Comorbidities Comorbidity 1;Comorbidity 2;Comorbidity 3+;Other    Comorbidities CAD, right knee surgery, OA, TIA, DM, carotid patch, h/o neck pain and injections.    Examination-Activity Limitations Other    Examination-Participation Restrictions Other    Stability/Clinical Decision Making Evolving/Moderate complexity    Rehab Potential Good    PT Frequency 2x / week    PT Duration 6 weeks    PT Treatment/Interventions ADLs/Self Care Home Management;Electrical Stimulation;Moist Heat;Therapeutic activities;Therapeutic exercise;Manual techniques;Patient/family education;Passive range of motion;Dry needling    Consulted and Agree with Plan of Care Patient             Patient will benefit from skilled  therapeutic intervention in order to improve the following deficits and impairments:  Pain, Decreased activity tolerance, Decreased range of motion, Increased muscle spasms  Visit Diagnosis: Cervicalgia  Abnormal posture     Problem List Patient Active Problem  List   Diagnosis Date Noted   Supraventricular tachycardia ([place]) 06/27/2020   Angina, class III ([place]) 06/27/2020   Cirrhosis, nonalcoholic ([place]) 47/10/6281   Encephalopathy, hepatic ([place]) 03/28/2020   Cervical spondylosis 06/07/2019   Cervical radiculitis 05/28/2019   Peptic ulcer disease 01/25/2019   Iron deficiency anemia 01/25/2019   Oral candidiasis 01/25/2019   Fatty liver disease, nonalcoholic 66/29/4765   Absolute anemia 08/17/2018   Cardiomyopathy ([place]) 08/07/2017   Non-rheumatic mitral regurgitation 08/07/2017   Non-rheumatic tricuspid valve insufficiency 08/07/2017   PAD (peripheral artery disease) ([place]) 08/07/2017   Carotid artery stenosis, symptomatic, right 07/01/2017   Stenosis of right carotid artery 06/23/2017   Orthostatic tremor 06/23/2017   Transient left leg weakness 08/16/2016   Anxiety state 07/01/2016   Mild neurocognitive disorder 05/02/2016   Migraine without aura and without status migrainosus, not intractable 01/27/2015   Depression 07/25/2014   Chronic daily headache 12/03/2013   Transient alteration of awareness 05/25/2013   Osteoarthritis of right knee 09/03/2012   Right knee DJD    IBS (irritable bowel syndrome)    Diverticulosis of colon (without mention of hemorrhage)    GERD (gastroesophageal reflux disease)    Hypertension    Hormone replacement therapy (postmenopausal)    Migraine    Seizures ([place])    Hemorrhoids, external, without mention of complication    Hemorrhoids, external, without mention of complication    SHOULDER PAIN 08/24/2008   IMPINGEMENT SYNDROME 08/24/2008    Avi Archuleta, [place] MPT 08/16/2020, 11:20 AM  [facility] [address] [place], [place], [address] Phone: [phone]   Fax:  [phone]  Name: Carol Richmond MRN: [id number] Date of Birth: 24-Mar-1950

## 2020-08-16 NOTE — Patient Instructions (Signed)
[facility](S[facility]   DRY NEEDLING CONSENT FORM   Trigger point dry needling is a physical therapy approach to treat Myofascial Pain and Dysfunction.  Dry Needling (DN) is a valuable and effective way to deactivate myofascial trigger points (muscle knots/pain). It is skilled intervention that uses a thin filiform needle to penetrate the skin and stimulate underlying myofascial trigger points, muscular, and connective tissues for the management of neuromusculoskeletal pain and movement impairments.  A local twitch response (LTR) will be elicited.  This can sometimes feel like a deep ache in the muscle during the procedure. Multiple trigger points in multiple muscles can be treated during each treatment.  No medication of any kind is injected.   As with any medical treatment and procedure, there are possible adverse events.  While significant adverse events are uncommon, they do sometimes occur and must be considered prior to giving consent.  Dry needling often causes a "post needling soreness".  There can be an increase in pain from a couple of hours to 2-3 days, followed by an improvement in the overall pain state. Any time a needle is used there is a risk of infection.  However, we are using new, sterile, and disposable needles; infections are extremely rare. There is a possibility that you may bleed or bruise.  You may feel tired and some nausea following treatment. There is a rare possibility of a pneumothorax (air in the chest cavity). Allergic reaction to nickel in the stainless steel needle. If a nerve is touched, it may cause paresthesia (a prickling/shock sensation) which is usually brief, but may continue for a couple of days.  Following treatment stay hydrated.  Continue regular activities but not too vigorous initially after treatment for 24-48 hours.  Dry Needling is best when combined with other physical therapy interventions such as strengthening, stretching  and other therapeutic modalities.   PLEASE ANSWER THE FOLLOWING QUESTIONS:  Do you have a lack of sensation?   Y/N  Do you have a phobia or fear of needles  Y/N  Are you pregnant?    Y/N If yes:  How many weeks? __________ Do you have any implanted devices?  Y/N If yes:  Pacemaker/Spinal Cord Stimulator/Deep Brain Stimulator/Insulin Pump/Other: ________________ Do you have any implants?  Y/N If yes: Breast/Facial/Pecs/Buttocks/Calves/Hip  Replacement/ Knee Replacement/Other: _________ Do you take any blood thinners?   Y/N If yes: Coumadin (Warfarin)/Other: ___________________ Do you have a bleeding disorder?   Y/N If yes: What kind: _________________________________ Do you take any immunosuppressants?  Y/N If yes:   What kind: _________________________________ Do you take anti-inflammatories?   Y/N If yes: What kind: Advil/Aspirin/Other: ________________ Have you ever been diagnosed with Scoliosis? Y/N Have you had back surgery?   Y/N If yes:  Laminectomy/Fusion/Other: ___________________   I have read, or had read to me, the above.  I have had the opportunity to ask any questions.  All of my questions have been answered to my satisfaction and I understand the risks involved with dry needling.  I consent to examination and treatment at [facility], including dry needling, of any and all of my involved and affected muscles.     Signature: __________________________________     Date:___________________________________________________

## 2020-08-23 ENCOUNTER — Ambulatory Visit: Payer: Medicare HMO | Admitting: Physical Therapy

## 2020-08-23 ENCOUNTER — Other Ambulatory Visit: Payer: Self-pay

## 2020-08-23 ENCOUNTER — Ambulatory Visit: Payer: Medicare HMO | Admitting: Family Medicine

## 2020-08-23 ENCOUNTER — Encounter: Payer: Self-pay | Admitting: Family Medicine

## 2020-08-23 ENCOUNTER — Ambulatory Visit (INDEPENDENT_AMBULATORY_CARE_PROVIDER_SITE_OTHER): Payer: Medicare HMO | Admitting: Family Medicine

## 2020-08-23 VITALS — BP 100/59 | HR 91 | Temp 98.6°F | Ht 64.0 in | Wt 167.2 lb

## 2020-08-23 DIAGNOSIS — N3000 Acute cystitis without hematuria: Secondary | ICD-10-CM | POA: Diagnosis not present

## 2020-08-23 DIAGNOSIS — R21 Rash and other nonspecific skin eruption: Secondary | ICD-10-CM

## 2020-08-23 DIAGNOSIS — R8290 Unspecified abnormal findings in urine: Secondary | ICD-10-CM | POA: Diagnosis not present

## 2020-08-23 LAB — URINALYSIS, ROUTINE W REFLEX MICROSCOPIC
Bilirubin, UA: NEGATIVE
Glucose, UA: NEGATIVE
Ketones, UA: NEGATIVE
Nitrite, UA: NEGATIVE
Protein,UA: NEGATIVE
Specific Gravity, UA: 1.01 (ref 1.005–1.030)
Urobilinogen, Ur: 0.2 mg/dL (ref 0.2–1.0)
pH, UA: 7 (ref 5.0–7.5)

## 2020-08-23 LAB — MICROSCOPIC EXAMINATION: RBC, Urine: NONE SEEN /hpf (ref 0–2)

## 2020-08-23 MED ORDER — NYSTATIN 100000 UNIT/GM EX CREA: 1.0000 "application " | TOPICAL_CREAM | Freq: Two times a day (BID) | CUTANEOUS | 0 refills | Status: DC

## 2020-08-23 MED ORDER — CEPHALEXIN 500 MG PO CAPS: 500.0000 mg | ORAL_CAPSULE | Freq: Two times a day (BID) | ORAL | 0 refills | Status: DC

## 2020-08-23 NOTE — Patient Instructions (Signed)
Urinary Tract Infection, Adult  A urinary tract infection (UTI) is an infection of any part of the urinary tract. The urinary tract includes the kidneys, ureters, bladder, and urethra.These organs make, store, and get rid of urine in the body. An upper UTI affects the ureters and kidneys. A lower UTI affects the bladderand urethra. What are the causes? Most urinary tract infections are caused by bacteria in your genital area around your urethra, where urine leaves your body. These bacteria grow andcause inflammation of your urinary tract. What increases the risk? You are more likely to develop this condition if: You have a urinary catheter that stays in place. You are not able to control when you urinate or have a bowel movement (incontinence). You are female and you: Use a spermicide or diaphragm for birth control. Have low estrogen levels. Are pregnant. You have certain genes that increase your risk. You are sexually active. You take antibiotic medicines. You have a condition that causes your flow of urine to slow down, such as: An enlarged prostate, if you are female. Blockage in your urethra. A kidney stone. A nerve condition that affects your bladder control (neurogenic bladder). Not getting enough to drink, or not urinating often. You have certain medical conditions, such as: Diabetes. A weak disease-fighting system (immunesystem). Sickle cell disease. Gout. Spinal cord injury. What are the signs or symptoms? Symptoms of this condition include: Needing to urinate right away (urgency). Frequent urination. This may include small amounts of urine each time you urinate. Pain or burning with urination. Blood in the urine. Urine that smells bad or unusual. Trouble urinating. Cloudy urine. Vaginal discharge, if you are female. Pain in the abdomen or the lower back. You may also have: Vomiting or a decreased appetite. Confusion. Irritability or tiredness. A fever or  chills. Diarrhea. The first symptom in older adults may be confusion. In some cases, they may nothave any symptoms until the infection has worsened. How is this diagnosed? This condition is diagnosed based on your medical history and a physical exam. You may also have other tests, including: Urine tests. Blood tests. Tests for STIs (sexually transmitted infections). If you have had more than one UTI, a cystoscopy or imaging studies may be doneto determine the cause of the infections. How is this treated? Treatment for this condition includes: Antibiotic medicine. Over-the-counter medicines to treat discomfort. Drinking enough water to stay hydrated. If you have frequent infections or have other conditions such as a kidney stone, you may need to see a health care provider who specializes in the urinary tract (urologist). In rare cases, urinary tract infections can cause sepsis. Sepsis is a life-threatening condition that occurs when the body responds to an infection. Sepsis is treated in the hospital with IV antibiotics, fluids, and othermedicines. Follow these instructions at home:  Medicines Take over-the-counter and prescription medicines only as told by your health care provider. If you were prescribed an antibiotic medicine, take it as told by your health care provider. Do not stop using the antibiotic even if you start to feel better. General instructions Make sure you: Empty your bladder often and completely. Do not hold urine for long periods of time. Empty your bladder after sex. Wipe from front to back after urinating or having a bowel movement if you are female. Use each tissue only one time when you wipe. Drink enough fluid to keep your urine pale yellow. Keep all follow-up visits. This is important. Contact a health care provider if: Your symptoms do  not get better after 1-2 days. Your symptoms go away and then return. Get help right away if: You have severe pain in your  back or your lower abdomen. You have a fever or chills. You have nausea or vomiting. Summary A urinary tract infection (UTI) is an infection of any part of the urinary tract, which includes the kidneys, ureters, bladder, and urethra. Most urinary tract infections are caused by bacteria in your genital area. Treatment for this condition often includes antibiotic medicines. If you were prescribed an antibiotic medicine, take it as told by your health care provider. Do not stop using the antibiotic even if you start to feel better. Keep all follow-up visits. This is important. This information is not intended to replace advice given to you by your health care provider. Make sure you discuss any questions you have with your healthcare provider. Document Revised: 09/17/2019 Document Reviewed: 09/17/2019 Elsevier Patient Education  [place].

## 2020-08-23 NOTE — Progress Notes (Signed)
Acute Office Visit  Subjective:    [name], female    DOB: [birth year], 70 y.o.   MRN: [id number]  Chief Complaint  Patient presents with   Urinary Tract Infection    HPI Patient is in today for UTI symptoms for about 1 week. She reports odor and urgency. She denies dysuria, frequency, flank pain, nausea, vomiting, fever or chills.   She has a rash on her left foot. She reports the rash has been there for about 1 week. It is itchy and burning. She has tried some cream with some improvement. She isn't sure what type of cream it was.    Past Medical History:  Diagnosis Date   Asthma    Carotid artery disease ([place])    Diverticulosis of colon (without mention of hemorrhage)    Essential hypertension    Functional ovarian cysts    GERD (gastroesophageal reflux disease)    Hemorrhoids, external, without mention of complication    Hormone replacement therapy (postmenopausal)    IBS (irritable bowel syndrome)    Migraine    Mitral valve disease    Right knee DJD    Seizures ([facility])    Sleep apnea    TIA (transient ischemic attack)    Type 2 diabetes mellitus ([place])    Type 2 diabetes mellitus with hyperglycemia, without long-term current use of insulin ([place]) 08/07/2017    Past Surgical History:  Procedure Laterality Date   BIOPSY  09/23/2018   Procedure: BIOPSY;  Surgeon: Rogene Houston, MD;  Location: [facility];  Service: Endoscopy;;  duodeneum and stomach   CHOLECYSTECTOMY  1980   ENDARTERECTOMY Right 07/01/2017   Procedure: ENDARTERECTOMY CAROTID RIGHT;  Surgeon: Serafina Mitchell, MD;  Location: [facility] OR;  Service: Vascular;  Laterality: Right;   ESOPHAGOGASTRODUODENOSCOPY N/A 09/23/2018   Procedure: ESOPHAGOGASTRODUODENOSCOPY (EGD);  Surgeon: Rogene Houston, MD;  Location: [facility];  Service: Endoscopy;  Laterality: N/A;  2:45-moved to 1:45 per Lelon Frohlich   LEFT HEART CATHETERIZATION WITH CORONARY ANGIOGRAM N/A 11/18/2013   Procedure: LEFT HEART  CATHETERIZATION WITH CORONARY ANGIOGRAM;  Surgeon: Burnell Blanks, MD;  Location: [facility] CATH LAB;  Service: Cardiovascular;  Laterality: N/A;   OOPHORECTOMY     2012 Dr. Adah Perl in [place] Right 07/01/2017   Procedure: RIGHT CAROTID PATCH ANGIOPLASTY;  Surgeon: Serafina Mitchell, MD;  Location: [place];  Service: Vascular;  Laterality: Right;   TONSILLECTOMY     TOTAL KNEE ARTHROPLASTY Right 09/02/2012   Procedure: TOTAL KNEE ARTHROPLASTY- right;  Surgeon: Ninetta Lights, MD;  Location: [place];  Service: Orthopedics;  Laterality: Right;   TUBAL LIGATION     TYMPANOMASTOIDECTOMY Left 04/02/2016   Procedure: LEFT CANAL WALL DOWN TYMPANOMASTOIDECTOMY;  Surgeon: Leta Baptist, MD;  Location: [place];  Service: ENT;  Laterality: Left;   VAGINAL HYSTERECTOMY  1990   WOUND EXPLORATION Right 07/02/2017   Procedure: RIGHT NECK EXPLORATION WITH INTRAOPERATIVE ULTRASOUND;  Surgeon: Angelia Mould, MD;  Location: [facility] OR;  Service: Vascular;  Laterality: Right;    Family History  Problem Relation Age of Onset   Heart disease Mother    Diabetes Mother    Heart disease Brother    Cancer Sister    Heart disease Brother    Heart disease Brother    Heart disease Son     Social History   Socioeconomic History   Marital status: Widowed    Spouse name: Not on  file   Number of children: 3   Years of education: Not on file   Highest education level: Not on file  Occupational History   Not on file  Tobacco Use   Smoking status: Never   Smokeless tobacco: Never  Vaping Use   Vaping Use: Never used  Substance and Sexual Activity   Alcohol use: No    Alcohol/week: 0.0 standard drinks   Drug use: No   Sexual activity: Not on file  Other Topics Concern   Not on file  Social History Narrative   Lives alone. Granddaughter lives across the street, her 3 children all live nearby   Social Determinants of Health   Financial Resource Strain: Low Risk     Difficulty of Paying Living Expenses: Not hard at all  Food Insecurity: No Food Insecurity   Worried About Charity fundraiser in the Last Year: Never true   Arboriculturist in the Last Year: Never true  Transportation Needs: No Transportation Needs   Lack of Transportation (Medical): No   Lack of Transportation (Non-Medical): No  Physical Activity: Sufficiently Active   Days of Exercise per Week: 7 days   Minutes of Exercise per Session: 30 min  Stress: No Stress Concern Present   Feeling of Stress : Not at all  Social Connections: Moderately Integrated   Frequency of Communication with Friends and Family: More than three times a week   Frequency of Social Gatherings with Friends and Family: More than three times a week   Attends Religious Services: More than 4 times per year   Active Member of [employer] or Organizations: Yes   Attends Archivist Meetings: More than 4 times per year   Marital Status: Widowed  Human resources officer Violence: Not At Risk   Fear of Current or Ex-Partner: No   Emotionally Abused: No   Physically Abused: No   Sexually Abused: No    Outpatient Medications Prior to Visit  Medication Sig Dispense Refill   Acetaminophen (TYLENOL ARTHRITIS EXT RELIEF PO) Take 650 mg by mouth daily.      albuterol (VENTOLIN HFA) 108 (90 Base) MCG/ACT inhaler Inhale 2 puffs into the lungs every 4 (four) hours as needed for shortness of breath. 18 g 11   aspirin EC 81 MG tablet Take 1 tablet (81 mg total) by mouth daily.     baclofen (LIORESAL) 10 MG tablet Take 1 tablet (10 mg total) by mouth 3 (three) times daily. 30 each 2   carvedilol (COREG) 3.125 MG tablet Take 1 tablet (3.125 mg total) by mouth 2 (two) times daily with a meal. 180 tablet 1   cetirizine (ZYRTEC) 10 MG tablet Take 1 tablet (10 mg total) by mouth daily. 90 tablet 2   cholecalciferol (VITAMIN D) 1000 UNITS tablet Take 1,000 Units by mouth daily.     DULoxetine (CYMBALTA) 60 MG capsule Take 1 capsule (60 mg  total) by mouth daily. 30 capsule 2   fluticasone (FLOVENT HFA) 44 MCG/ACT inhaler Inhale 2 puffs into the lungs 2 (two) times daily. 1 each 12   furosemide (LASIX) 40 MG tablet Take 1 tablet (40 mg total) by mouth 2 (two) times daily. 60 tablet 1   gabapentin (NEURONTIN) 300 MG capsule Take 300 mg by mouth at bedtime.     lactulose (CHRONULAC) 10 GM/15ML solution Take 30 mLs (20 g total) by mouth 3 (three) times daily. 1892 mL 5   lamoTRIgine (LAMICTAL) 200 MG tablet Take 1 tablet (  200 mg total) by mouth 2 (two) times daily. 60 tablet 2   loperamide (IMODIUM) 2 MG capsule Take 2 tablets after the first loose stool, then 1 tablet after each loose stool. Max of 8 tablets per day. 30 capsule 0   LORazepam (ATIVAN) 0.5 MG tablet Take 1 tablet (0.5 mg total) by mouth 2 (two) times daily as needed for anxiety. 30 tablet 2   Methylcobalamin (B12-ACTIVE PO) daily.     ondansetron (ZOFRAN) 4 MG tablet Take 1 tablet (4 mg total) by mouth every 8 (eight) hours as needed for nausea or vomiting. 20 tablet 0   Oxycodone HCl 10 MG TABS Take 10 mg by mouth 3 (three) times daily as needed.     pantoprazole (PROTONIX) 40 MG tablet Take 1 tablet (40 mg total) by mouth daily before breakfast. 90 tablet 3   Pediatric Multivitamins-Iron (FLINTSTONES PLUS IRON) chewable tablet Chew 1 tablet by mouth 2 (two) times daily.     potassium chloride SA (K-DUR,KLOR-CON) 20 MEQ tablet Take 20 mEq by mouth daily.      sacubitril-valsartan (ENTRESTO) 24-26 MG Take 1 tablet by mouth 2 (two) times daily. 60 tablet 6   Facility-Administered Medications Prior to Visit  Medication Dose Route Frequency Provider Last Rate Last Admin   magic mouthwash  5 mL Oral TID PRN Laurine Blazer B, PA-C        No Known Allergies  Review of Systems As per HPI.     Objective:    Physical Exam Vitals and nursing note reviewed.  Constitutional:      General: She is not in acute distress.    Appearance: She is not ill-appearing,  toxic-appearing or diaphoretic.  Cardiovascular:     Rate and Rhythm: Normal rate and regular rhythm.     Heart sounds: Normal heart sounds. No murmur heard. Pulmonary:     Effort: Pulmonary effort is normal. No respiratory distress.     Breath sounds: Normal breath sounds.  Abdominal:     General: Bowel sounds are normal. There is no distension.     Palpations: Abdomen is soft.     Tenderness: There is no abdominal tenderness. There is no right CVA tenderness, left CVA tenderness, guarding or rebound.  Skin:    General: Skin is warm and dry.     Findings: Rash (left foot) present.  Neurological:     General: No focal deficit present.     Mental Status: She is alert and oriented to person, place, and time.  Psychiatric:        Mood and Affect: Mood normal.        Behavior: Behavior normal.   Urine dipstick shows positive for RBC's and positive for leukocytes.  Micro exam: 0-5 WBC's per HPF and moderate + bacteria.   BP (!) 100/59   Pulse 91   Temp 98.6 F (37 C) (Oral)   Ht _0  (1.626 m)   Wt 167 lb 4 oz (75.9 kg)   BMI 28.71 kg/m  Wt Readings from Last 3 Encounters:  08/23/20 167 lb 4 oz (75.9 kg)  07/04/20 158 lb (71.7 kg)  06/27/20 159 lb (72.1 kg)    Health Maintenance Due  Topic Date Due   Zoster Vaccines- Shingrix (1 of 2) Never done   MAMMOGRAM  02/02/2011   COVID-19 Vaccine (4 - Booster for Moderna series) 05/29/2020    There are no preventive care reminders to display for this patient.   Lab Results  Component Value  Date   TSH 0.941 03/29/2020   Lab Results  Component Value Date   WBC 13.6 (H) 06/27/2020   HGB 13.4 06/27/2020   HCT 39.0 06/27/2020   MCV 84 06/27/2020   PLT 245 06/27/2020   Lab Results  Component Value Date   NA 143 06/27/2020   NA 143 06/27/2020   K 4.5 06/27/2020   K 4.7 06/27/2020   CO2 27 06/27/2020   CO2 27 06/27/2020   GLUCOSE 70 06/27/2020   GLUCOSE 73 06/27/2020   BUN 11 06/27/2020   BUN 12 06/27/2020    CREATININE 0.89 06/27/2020   CREATININE 0.87 06/27/2020   BILITOT 0.4 06/27/2020   ALKPHOS 146 (H) 06/27/2020   AST 26 06/27/2020   ALT 18 06/27/2020   PROT 7.4 06/27/2020   ALBUMIN 4.2 06/27/2020   CALCIUM 9.2 06/27/2020   CALCIUM 9.7 06/27/2020   ANIONGAP 12 08/15/2017   EGFR 71 06/27/2020   EGFR 73 06/27/2020   Lab Results  Component Value Date   CHOL 190 06/27/2020   Lab Results  Component Value Date   HDL 74 06/27/2020   Lab Results  Component Value Date   LDLCALC 102 (H) 06/27/2020   Lab Results  Component Value Date   TRIG 77 06/27/2020   Lab Results  Component Value Date   CHOLHDL 2.6 06/27/2020   Lab Results  Component Value Date   HGBA1C 5.7 03/29/2020       Assessment & Plan:   Carol Richmond was seen today for urinary tract infection.  Diagnoses and all orders for this visit:  Acute cystitis without hematuria Start keflex as below. Culture pending.  -     Urinalysis, Routine w reflex microscopic -     Microscopic Examination -     cephALEXin (KEFLEX) 500 MG capsule; Take 1 capsule (500 mg total) by mouth 2 (two) times daily. -     Urine Culture  Rash of foot Try nystatin cream as below.  -     nystatin cream (MYCOSTATIN); Apply 1 application topically 2 (two) times daily.   Return to office for new or worsening symptoms, or if symptoms persist.   The patient indicates understanding of these issues and agrees with the plan.  Gwenlyn Perking, FNP

## 2020-08-29 ENCOUNTER — Ambulatory Visit: Payer: Medicare HMO | Attending: Family Medicine

## 2020-08-29 ENCOUNTER — Other Ambulatory Visit: Payer: Self-pay

## 2020-08-29 DIAGNOSIS — M542 Cervicalgia: Secondary | ICD-10-CM | POA: Insufficient documentation

## 2020-08-29 DIAGNOSIS — R293 Abnormal posture: Secondary | ICD-10-CM | POA: Insufficient documentation

## 2020-08-29 NOTE — Therapy (Signed)
[facility] [place], [place], [address] Phone: [phone]   Fax:  [phone]  Physical Therapy Treatment  Patient Details  Name: Carol Richmond MRN: [id number] Date of Birth: 08/02/1950 Referring Provider (PT): Marjorie Smolder   Encounter Date: 08/29/2020   PT End of Session - 08/29/20 1128     Visit Number 10    Number of Visits 12    Date for PT Re-Evaluation 09/12/20    Authorization Type FOTO AT LEAST EVERY 5TH VISIT.  PROGRESS NOTE AT 10TH VISIT.  KX MODIFIER AFTER 15 VISITS.    PT Start Time 1030    PT Stop Time 1128    PT Time Calculation (min) 58 min    Activity Tolerance Patient tolerated treatment well    Behavior During Therapy WFL for tasks assessed/performed             Past Medical History:  Diagnosis Date   Asthma    Carotid artery disease ([place])    Diverticulosis of colon (without mention of hemorrhage)    Essential hypertension    Functional ovarian cysts    GERD (gastroesophageal reflux disease)    Hemorrhoids, external, without mention of complication    Hormone replacement therapy (postmenopausal)    IBS (irritable bowel syndrome)    Migraine    Mitral valve disease    Right knee DJD    Seizures ([facility])    Sleep apnea    TIA (transient ischemic attack)    Type 2 diabetes mellitus ([place])    Type 2 diabetes mellitus with hyperglycemia, without long-term current use of insulin ([place]) 08/07/2017    Past Surgical History:  Procedure Laterality Date   BIOPSY  09/23/2018   Procedure: BIOPSY;  Surgeon: Rogene Houston, MD;  [facility]: [facility];  Service: Endoscopy;;  duodeneum and stomach   CHOLECYSTECTOMY  1980   ENDARTERECTOMY Right 07/01/2017   Procedure: ENDARTERECTOMY CAROTID RIGHT;  Surgeon: Serafina Mitchell, MD;  Location: [facility] OR;  Service: Vascular;  Laterality: Right;   ESOPHAGOGASTRODUODENOSCOPY N/A 09/23/2018   Procedure: ESOPHAGOGASTRODUODENOSCOPY (EGD);  Surgeon: Rogene Houston, MD;  [facility];  Service: Endoscopy;  Laterality: N/A;  2:45-moved to 1:45 per Lelon Frohlich   LEFT HEART CATHETERIZATION WITH CORONARY ANGIOGRAM N/A 11/18/2013   Procedure: LEFT HEART CATHETERIZATION WITH CORONARY ANGIOGRAM;  Surgeon: Burnell Blanks, MD;  Location: [facility] CATH LAB;  Service: Cardiovascular;  Laterality: N/A;   OOPHORECTOMY     2012 Dr. Adah Perl in [place] Right 07/01/2017   Procedure: RIGHT CAROTID PATCH ANGIOPLASTY;  Surgeon: Serafina Mitchell, MD;  Location: [facility];  Service: Vascular;  Laterality: Right;   TONSILLECTOMY     TOTAL KNEE ARTHROPLASTY Right 09/02/2012   Procedure: TOTAL KNEE ARTHROPLASTY- right;  Surgeon: Ninetta Lights, MD;  Location: [facility];  Service: Orthopedics;  Laterality: Right;   TUBAL LIGATION     TYMPANOMASTOIDECTOMY Left 04/02/2016   Procedure: LEFT CANAL WALL DOWN TYMPANOMASTOIDECTOMY;  Surgeon: Leta Baptist, MD;  Location: [facility];  Service: ENT;  Laterality: Left;   [place] Right 07/02/2017   Procedure: RIGHT NECK EXPLORATION WITH INTRAOPERATIVE ULTRASOUND;  Surgeon: Angelia Mould, MD;  Location: [facility];  Service: Vascular;  Laterality: Right;    There were no vitals filed for this visit.   Subjective Assessment - 08/29/20 1041     Subjective COVID-19 screen performed prior to patient entering clinic.  She states she is doing a little better but still just sore - she states that she wants to have dry needling next time.    Pertinent History CAD, right knee surgery, OA, TIA, DM, carotid patch, h/o neck pain and injections.    Limitations Lifting;Reading;House hold activities    Patient Stated Goals Get out of pain and rid headaches.    Currently in Pain? Yes    Pain Score 5     Pain Location Neck    Pain Orientation Right    Pain Descriptors / Indicators Aching;Tightness    Pain Type Chronic pain    Pain Onset More than a month ago    Pain Frequency  Constant    Multiple Pain Sites No                [facility] PT Assessment - 08/29/20 1030       Assessment   Medical Diagnosis Neck pain.    Referring Provider (PT) Tiffany Lilia Pro      Cognition   Overall Cognitive Status Difficult to assess    Executive Function Sequencing    Sequencing Impaired    Behaviors Other (comment)   hard of hearing and short term memory loss     Posture/Postural Control   Posture/Postural Control Postural limitations    Postural Limitations Rounded Shoulders;Forward head      ROM / Strength   AROM / PROM / Strength AROM      AROM   Overall AROM Comments Right active cervical rotation is 55 degrees and left is 51 degrees.  Bilateral active sidebending is 15 degrees.      Strength   Overall Strength Comments Normal UE strength.      Palpation   Palpation comment TTP at the L UT and periscapular region                           [facility] Adult PT Treatment/Exercise - 08/29/20 1030       Exercises   Exercises Neck      Neck Exercises: Machines for Strengthening   UBE (Upper Arm Bike) 59mns retro ; 4 mins fwd; 2 mins retro (icreased intensity) 2 mins fwd      Neck Exercises: Theraband   Scapula Retraction 15 reps    Scapula Retraction Limitations Green tband    Shoulder External Rotation 10 reps    Shoulder External Rotation Limitations green tband      Neck Exercises: Seated   Cervical Isometrics Flexion    Cervical Isometrics Limitations chin tuck into towel    Cervical Rotation Both;20 reps    Cervical Rotation Limitations with towel under the chinme      Moist Heat Therapy   Number Minutes Moist Heat 15 Minutes    Moist Heat Location Cervical      Electrical Stimulation   Electrical Stimulation Location Bilateral cervical    Electrical Stimulation Action IFC [id number]_0     Electrical Stimulation Parameters 40% scan x 15    Electrical Stimulation Goals Tone;Pain      Manual Therapy   Manual Therapy Passive  ROM;Soft tissue mobilization;Joint mobilization    Manual therapy comments less UT tension with PROM and stretches of UT and LS, improved cervical rotation    Joint Mobilization rotation gr II with AROM cx rotation, gentle manual STM    Passive ROM P/ROM cervical rotation and flexion  PT Education - 08/29/20 1128     Education Details Reviewed HEP - patient with limited recall and needs repeated visual and verbal cues    Person(s) Educated Patient    Methods Demonstration    Comprehension Verbal cues required                 PT Long Term Goals - 08/29/20 1156       PT LONG TERM GOAL #1   Title Independent with a HEP.    Baseline Lacks awaresness and carry over of HEP    Time 2    Period Weeks    Status Not Met      PT LONG TERM GOAL #2   Title Increase active cervical rotation to 60-65 degrees+ so patient can turn head more easily while driving.    Baseline Met 08/29/20 : 65 deg bil cervical rotation    Status Achieved      PT LONG TERM GOAL #3   Title Eliminate headaches.    Baseline "they come and go but it is nothing like it used to be"    Time 2    Period Weeks    Status On-going                   Plan - 08/29/20 1143     Clinical Impression Statement Carol Richmond has participated in PT well with excellent progress in cervical rotation ROM however continues to have concerns regarding tension and stiffness in the neck. She demonstrates improved function with iniital cervcal rotation at 41 and 49 deg to bil 65 degrees available without difficulty/pain nor discomfort. Patient may benefit from additional intervention to address soft tissue restrictions.    Personal Factors and Comorbidities Comorbidity 1;Comorbidity 2;Comorbidity 3+;Other    Comorbidities CAD, right knee surgery, OA, TIA, DM, carotid patch, h/o neck pain and injections.    Examination-Activity Limitations Other    Examination-Participation Restrictions  Other    Stability/Clinical Decision Making Evolving/Moderate complexity    Clinical Decision Making Low    Rehab Potential Good    PT Frequency 2x / week    PT Duration 2 weeks    PT Treatment/Interventions ADLs/Self Care Home Management;Electrical Stimulation;Moist Heat;Therapeutic activities;Therapeutic exercise;Manual techniques;Patient/family education;Passive range of motion;Dry needling    PT Next Visit Plan Dry needling; Posture emphasis HEP    Consulted and Agree with Plan of Care Patient             Patient will benefit from skilled therapeutic intervention in order to improve the following deficits and impairments:  Pain, Decreased activity tolerance, Decreased range of motion, Increased muscle spasms  Visit Diagnosis: Cervicalgia  Abnormal posture     Problem List Patient Active Problem List   Diagnosis Date Noted   Supraventricular tachycardia ([place]) 06/27/2020   Angina, class III ([place]) 06/27/2020   Cirrhosis, nonalcoholic ([place]) 98/12/9145   Encephalopathy, hepatic ([place]) 03/28/2020   Cervical spondylosis 06/07/2019   Cervical radiculitis 05/28/2019   Peptic ulcer disease 01/25/2019   Iron deficiency anemia 01/25/2019   Oral candidiasis 01/25/2019   Fatty liver disease, nonalcoholic 82/95/6213   Absolute anemia 08/17/2018   Cardiomyopathy ([place]) 08/07/2017   Non-rheumatic mitral regurgitation 08/07/2017   Non-rheumatic tricuspid valve insufficiency 08/07/2017   PAD (peripheral artery disease) ([place]) 08/07/2017   Carotid artery stenosis, symptomatic, right 07/01/2017   Stenosis of right carotid artery 06/23/2017   Orthostatic tremor 06/23/2017   Transient left leg weakness 08/16/2016   Anxiety state 07/01/2016   Mild  neurocognitive disorder 05/02/2016   Migraine without aura and without status migrainosus, not intractable 01/27/2015   Depression 07/25/2014   Chronic daily headache 12/03/2013   Transient alteration of awareness 05/25/2013   Osteoarthritis  of right knee 09/03/2012   Right knee DJD    IBS (irritable bowel syndrome)    Diverticulosis of colon (without mention of hemorrhage)    GERD (gastroesophageal reflux disease)    Hypertension    Hormone replacement therapy (postmenopausal)    Migraine    Seizures ([place])    Hemorrhoids, external, without mention of complication    Hemorrhoids, external, without mention of complication    SHOULDER PAIN 08/24/2008   IMPINGEMENT SYNDROME 08/24/2008    Marylou Mccoy PT, DPT 08/29/2020, 12:00 PM  [facility] [place], [place], [address] Phone: [phone]   Fax:  [phone]  Name: Carol Richmond MRN: [id number] Date of Birth: May 20, 1950

## 2020-08-30 ENCOUNTER — Other Ambulatory Visit (INDEPENDENT_AMBULATORY_CARE_PROVIDER_SITE_OTHER): Payer: Self-pay | Admitting: Internal Medicine

## 2020-08-30 NOTE — Telephone Encounter (Signed)
Last seen 03/28/2020 Dr.Rehman Jerrye Bushy.

## 2020-09-06 ENCOUNTER — Ambulatory Visit: Payer: Medicare HMO | Admitting: Physical Therapy

## 2020-09-13 ENCOUNTER — Other Ambulatory Visit: Payer: Self-pay

## 2020-09-13 ENCOUNTER — Ambulatory Visit: Payer: Medicare HMO | Admitting: Physical Therapy

## 2020-09-13 NOTE — Therapy (Addendum)
[facility] [place], [place], [address] Phone: [phone]   Fax:  [phone]  Patient Details  Name: Carol Richmond MRN: [id number] Date of Birth: Jul 14, 1950 Referring Provider:  Gwenlyn Perking, FNP  Encounter Date: 09/13/2020  Patient arriving to clinic to day with no pain.  Will place patient on hold.  She said she would call should she feel she needs further treatment. Tannah Dreyfuss, [place] MPT 09/13/2020, 11:01 AM  [facility] [address] [place], [place], [address] Phone: [phone]   Fax:  [phone]

## 2020-09-18 ENCOUNTER — Other Ambulatory Visit: Payer: Self-pay

## 2020-09-18 ENCOUNTER — Encounter: Payer: Self-pay | Admitting: Family Medicine

## 2020-09-18 ENCOUNTER — Ambulatory Visit (INDEPENDENT_AMBULATORY_CARE_PROVIDER_SITE_OTHER): Payer: Medicare HMO | Admitting: Family Medicine

## 2020-09-18 VITALS — BP 129/76 | HR 75 | Temp 97.0°F | Resp 20 | Ht 64.0 in | Wt 168.0 lb

## 2020-09-18 DIAGNOSIS — Z79899 Other long term (current) drug therapy: Secondary | ICD-10-CM

## 2020-09-18 DIAGNOSIS — I70211 Atherosclerosis of native arteries of extremities with intermittent claudication, right leg: Secondary | ICD-10-CM | POA: Diagnosis not present

## 2020-09-18 DIAGNOSIS — K219 Gastro-esophageal reflux disease without esophagitis: Secondary | ICD-10-CM

## 2020-09-18 DIAGNOSIS — R21 Rash and other nonspecific skin eruption: Secondary | ICD-10-CM

## 2020-09-18 DIAGNOSIS — I209 Angina pectoris, unspecified: Secondary | ICD-10-CM

## 2020-09-18 DIAGNOSIS — E1140 Type 2 diabetes mellitus with diabetic neuropathy, unspecified: Secondary | ICD-10-CM

## 2020-09-18 DIAGNOSIS — F411 Generalized anxiety disorder: Secondary | ICD-10-CM | POA: Diagnosis not present

## 2020-09-18 DIAGNOSIS — R110 Nausea: Secondary | ICD-10-CM | POA: Diagnosis not present

## 2020-09-18 DIAGNOSIS — F339 Major depressive disorder, recurrent, unspecified: Secondary | ICD-10-CM

## 2020-09-18 DIAGNOSIS — I471 Supraventricular tachycardia: Secondary | ICD-10-CM | POA: Diagnosis not present

## 2020-09-18 DIAGNOSIS — I10 Essential (primary) hypertension: Secondary | ICD-10-CM

## 2020-09-18 DIAGNOSIS — E119 Type 2 diabetes mellitus without complications: Secondary | ICD-10-CM | POA: Diagnosis not present

## 2020-09-18 DIAGNOSIS — R569 Unspecified convulsions: Secondary | ICD-10-CM

## 2020-09-18 LAB — BAYER DCA HB A1C WAIVED: HB A1C (BAYER DCA - WAIVED): 6.1 % (ref <7.0)

## 2020-09-18 MED ORDER — ONDANSETRON HCL 4 MG PO TABS: 4.0000 mg | ORAL_TABLET | Freq: Three times a day (TID) | ORAL | 2 refills | Status: DC | PRN

## 2020-09-18 MED ORDER — LAMOTRIGINE 200 MG PO TABS: 200.0000 mg | ORAL_TABLET | Freq: Two times a day (BID) | ORAL | 2 refills | Status: DC

## 2020-09-18 MED ORDER — DULOXETINE HCL 60 MG PO CPEP: 60.0000 mg | ORAL_CAPSULE | Freq: Every day | ORAL | 5 refills | Status: DC

## 2020-09-18 MED ORDER — LORAZEPAM 0.5 MG PO TABS: 0.5000 mg | ORAL_TABLET | Freq: Two times a day (BID) | ORAL | 2 refills | Status: DC | PRN

## 2020-09-18 MED ORDER — CLOTRIMAZOLE-BETAMETHASONE 1-0.05 % EX CREA: 1.0000 "application " | TOPICAL_CREAM | Freq: Every day | CUTANEOUS | 0 refills | Status: DC

## 2020-09-18 NOTE — Patient Instructions (Signed)

## 2020-09-18 NOTE — Progress Notes (Signed)
Established Patient Office Visit  Subjective:  [name], female    DOB: September 19, 1950  Age: 70 y.o. MRN: [id number]  CC:  Chief Complaint  Patient presents with   Medical Management of Chronic Issues    HPI Carol Richmond presents for chronic follow up. She saw her cardiologist a few days ago. She will follow up in 6 months for another echo. She was started on Entresto.   1. HTN Complaint with meds - Yes Current Medications - Coreg, and Entresto Checking BP at home: no Exercising Regularly - No Watching Salt intake - No Pertinent ROS:  Headache - No Fatigue - No Visual Disturbances - No Chest pain - No Dyspnea - No Palpitations - No LE edema - No They report good compliance with medications and can restate their regimen by memory. No medication side effects.  Family, social, and smoking history reviewed.   BP Readings from Last 3 Encounters:  09/18/20 129/76  08/23/20 (!) 100/59  06/27/20 118/66   CMP Latest Ref Rng & Units 06/27/2020 06/27/2020 03/29/2020  Glucose 65 - 99 mg/dL 70 73 114(H)  BUN 8 - 27 mg/dL _0 Creatinine 0.57 - 1.00 mg/dL 0.89 0.87 0.91  Sodium 134 - 144 mmol/L 143 143 143  Potassium 3.5 - 5.2 mmol/L 4.5 4.7 3.5  Chloride 96 - 106 mmol/L 101 101 99  CO2 20 - 29 mmol/L _1 Calcium 8.7 - 10.3 mg/dL 9.2 9.7 9.5  Total Protein 6.0 - 8.5 g/dL 7.4 - 7.3  Total Bilirubin 0.0 - 1.2 mg/dL 0.4 - 0.4  Alkaline Phos 44 - 121 IU/L 146(H) - 139(H)  AST 0 - 40 IU/L 26 - 35  ALT 0 - 32 IU/L 18 - 20    2. Anxiety, depression Depression screen [facility] 2/9 09/18/2020 07/04/2020 06/27/2020  Decreased Interest 1 0 0  Down, Depressed, Hopeless 2 0 0  PHQ - 2 Score 3 0 0  Altered sleeping 0 0 0  Tired, decreased energy 1 0 1  Change in appetite 0 0 0  Feeling bad or failure about yourself  0 0 0  Trouble concentrating 0 0 0  Moving slowly or fidgety/restless 0 0 0  Suicidal thoughts 0 0 0  PHQ-9 Score 4 0 1  Difficult doing  work/chores Not difficult at all Not difficult at all Not difficult at all  Some recent data might be hidden   GAD 7 : Generalized Anxiety Score 09/18/2020 06/27/2020 04/13/2020  Nervous, Anxious, on Edge 0 1 2  Control/stop worrying 1 0 0  Worry too much - different things 1 1 0  Trouble relaxing 0 0 0  Restless 0 0 0  Easily annoyed or irritable 0 0 0  Afraid - awful might happen 0 0 0  Total GAD 7 Score _2 Anxiety Difficulty Not difficult at all Not difficult at all -    3. GERD Compliant with medications - Yes Current medications - protonix Adverse side effects - No Red Flags (weight loss, hematochezia, melena, weight loss, early satiety, fevers, odynophagia, or persistent vomiting) - No   4. Seizures On lamictal BID. Hasn't had a seizure in many years.   5. T2DM On patient's history. Reports she has never taken medication for this.    Past Medical History:  Diagnosis Date   Asthma    Carotid artery disease ([place])    Diverticulosis of [place] (without mention of hemorrhage)  Essential hypertension    Functional ovarian cysts    GERD (gastroesophageal reflux disease)    Hemorrhoids, external, without mention of complication    Hormone replacement therapy (postmenopausal)    IBS (irritable bowel syndrome)    Migraine    Mitral valve disease    Right knee DJD    Seizures (HCC)    Sleep apnea    TIA (transient ischemic attack)    Type 2 diabetes mellitus ([place])    Type 2 diabetes mellitus with hyperglycemia, without long-term current use of insulin ([place]) 08/07/2017    Past Surgical History:  Procedure Laterality Date   BIOPSY  09/23/2018   Procedure: BIOPSY;  Surgeon: Rogene Houston, MD;  [facility]: [facility];  Service: Endoscopy;;  duodeneum and stomach   CHOLECYSTECTOMY  1980   ENDARTERECTOMY Right 07/01/2017   Procedure: ENDARTERECTOMY CAROTID RIGHT;  Surgeon: Serafina Mitchell, MD;  Location: [facility] OR;  Service: Vascular;  Laterality: Right;    ESOPHAGOGASTRODUODENOSCOPY N/A 09/23/2018   Procedure: ESOPHAGOGASTRODUODENOSCOPY (EGD);  Surgeon: Rogene Houston, MD;  Location: [facility];  Service: Endoscopy;  Laterality: N/A;  2:45-moved to 1:45 per Lelon Frohlich   LEFT HEART CATHETERIZATION WITH CORONARY ANGIOGRAM N/A 11/18/2013   Procedure: LEFT HEART CATHETERIZATION WITH CORONARY ANGIOGRAM;  Surgeon: Burnell Blanks, MD;  Location: [facility] CATH LAB;  Service: Cardiovascular;  Laterality: N/A;   OOPHORECTOMY     2012 Dr. Adah Perl in [place] Right 07/01/2017   Procedure: RIGHT CAROTID PATCH ANGIOPLASTY;  Surgeon: Serafina Mitchell, MD;  Location: [place];  Service: Vascular;  Laterality: Right;   TONSILLECTOMY     TOTAL KNEE ARTHROPLASTY Right 09/02/2012   Procedure: TOTAL KNEE ARTHROPLASTY- right;  Surgeon: Ninetta Lights, MD;  Location: [place];  Service: Orthopedics;  Laterality: Right;   TUBAL LIGATION     TYMPANOMASTOIDECTOMY Left 04/02/2016   Procedure: LEFT CANAL WALL DOWN TYMPANOMASTOIDECTOMY;  Surgeon: Leta Baptist, MD;  Location: [place];  Service: ENT;  Laterality: Left;   VAGINAL HYSTERECTOMY  1990   WOUND EXPLORATION Right 07/02/2017   Procedure: RIGHT NECK EXPLORATION WITH INTRAOPERATIVE ULTRASOUND;  Surgeon: Angelia Mould, MD;  Location: [facility] OR;  Service: Vascular;  Laterality: Right;    Family History  Problem Relation Age of Onset   Heart disease Mother    Diabetes Mother    Heart disease Brother    Cancer Sister    Heart disease Brother    Heart disease Brother    Heart disease Son     Social History   Socioeconomic History   Marital status: Widowed    Spouse name: Not on file   Number of children: 3   Years of education: Not on file   Highest education level: Not on file  Occupational History   Not on file  Tobacco Use   Smoking status: Never   Smokeless tobacco: Never  Vaping Use   Vaping Use: Never used  Substance and Sexual Activity   Alcohol use: No    Alcohol/week:  0.0 standard drinks   Drug use: No   Sexual activity: Not on file  Other Topics Concern   Not on file  Social History Narrative   Lives alone. Granddaughter lives across the street, her 3 children all live nearby   Social Determinants of Health   Financial Resource Strain: Low Risk    Difficulty of Paying Living Expenses: Not hard at all  Food Insecurity: No Food Insecurity  Worried About Charity fundraiser in the Last Year: Never true   [place] in the Last Year: Never true  Transportation Needs: No Transportation Needs   Lack of Transportation (Medical): No   Lack of Transportation (Non-Medical): No  Physical Activity: Sufficiently Active   Days of Exercise per Week: 7 days   Minutes of Exercise per Session: 30 min  Stress: No Stress Concern Present   Feeling of Stress : Not at all  Social Connections: Moderately Integrated   Frequency of Communication with Friends and Family: More than three times a week   Frequency of Social Gatherings with Friends and Family: More than three times a week   Attends Religious Services: More than 4 times per year   Active Member of [employer] or Organizations: Yes   Attends Archivist Meetings: More than 4 times per year   Marital Status: Widowed  Human resources officer Violence: Not At Risk   Fear of Current or Ex-Partner: No   Emotionally Abused: No   Physically Abused: No   Sexually Abused: No    Outpatient Medications Prior to Visit  Medication Sig Dispense Refill   Acetaminophen (TYLENOL ARTHRITIS EXT RELIEF PO) Take 650 mg by mouth daily.      albuterol (VENTOLIN HFA) 108 (90 Base) MCG/ACT inhaler Inhale 2 puffs into the lungs every 4 (four) hours as needed for shortness of breath. 18 g 11   aspirin EC 81 MG tablet Take 1 tablet (81 mg total) by mouth daily.     baclofen (LIORESAL) 10 MG tablet Take 1 tablet (10 mg total) by mouth 3 (three) times daily. 30 each 2   carvedilol (COREG) 3.125 MG tablet Take 1 tablet (3.125 mg  total) by mouth 2 (two) times daily with a meal. 180 tablet 1   cetirizine (ZYRTEC) 10 MG tablet Take 1 tablet (10 mg total) by mouth daily. 90 tablet 2   cholecalciferol (VITAMIN D) 1000 UNITS tablet Take 1,000 Units by mouth daily.     DULoxetine (CYMBALTA) 60 MG capsule Take 1 capsule (60 mg total) by mouth daily. 30 capsule 2   fluticasone (FLOVENT HFA) 44 MCG/ACT inhaler Inhale 2 puffs into the lungs 2 (two) times daily. 1 each 12   furosemide (LASIX) 40 MG tablet Take 1 tablet (40 mg total) by mouth 2 (two) times daily. 60 tablet 1   gabapentin (NEURONTIN) 300 MG capsule Take 300 mg by mouth at bedtime.     lactulose (CHRONULAC) 10 GM/15ML solution TAKE 30 MLS (20 GRAM TOTAL) BY MOUTH 3 TIMES DAILY. 2700 mL 5   lamoTRIgine (LAMICTAL) 200 MG tablet Take 1 tablet (200 mg total) by mouth 2 (two) times daily. 60 tablet 2   loperamide (IMODIUM) 2 MG capsule Take 2 tablets after the first loose stool, then 1 tablet after each loose stool. Max of 8 tablets per day. 30 capsule 0   LORazepam (ATIVAN) 0.5 MG tablet Take 1 tablet (0.5 mg total) by mouth 2 (two) times daily as needed for anxiety. 30 tablet 2   Methylcobalamin (B12-ACTIVE PO) daily.     nystatin cream (MYCOSTATIN) Apply 1 application topically 2 (two) times daily. 30 g 0   ondansetron (ZOFRAN) 4 MG tablet Take 1 tablet (4 mg total) by mouth every 8 (eight) hours as needed for nausea or vomiting. 20 tablet 0   Oxycodone HCl 10 MG TABS Take 10 mg by mouth 3 (three) times daily as needed.     pantoprazole (PROTONIX)  40 MG tablet Take 1 tablet (40 mg total) by mouth daily before breakfast. 90 tablet 3   potassium chloride SA (K-DUR,KLOR-CON) 20 MEQ tablet Take 20 mEq by mouth daily.      sacubitril-valsartan (ENTRESTO) 24-26 MG Take 1 tablet by mouth 2 (two) times daily. 60 tablet 6   Pediatric Multivitamins-Iron (FLINTSTONES PLUS IRON) chewable tablet Chew 1 tablet by mouth 2 (two) times daily. (Patient not taking: Reported on 09/18/2020)      cephALEXin (KEFLEX) 500 MG capsule Take 1 capsule (500 mg total) by mouth 2 (two) times daily. 14 capsule 0   Facility-Administered Medications Prior to Visit  Medication Dose Route Frequency Provider Last Rate Last Admin   magic mouthwash  5 mL Oral TID PRN Laurine Blazer B, PA-C        No Known Allergies  ROS Review of Systems Negative unless specially indicated above in HPI.   Objective:    Physical Exam Vitals and nursing note reviewed.  Constitutional:      General: She is not in acute distress.    Appearance: Normal appearance. She is not ill-appearing, toxic-appearing or diaphoretic.  Eyes:     Conjunctiva/sclera: Conjunctivae normal.     Pupils: Pupils are equal, round, and reactive to light.  Neck:     Vascular: No carotid bruit.  Cardiovascular:     Rate and Rhythm: Normal rate and regular rhythm.     Heart sounds: Normal heart sounds. No murmur heard. Pulmonary:     Effort: Pulmonary effort is normal.     Breath sounds: Normal breath sounds. No wheezing.  Abdominal:     General: Bowel sounds are normal. There is no distension.     Palpations: Abdomen is soft.     Tenderness: There is no abdominal tenderness. There is no guarding or rebound.  Musculoskeletal:     Cervical back: No tenderness.     Right lower leg: No edema.     Left lower leg: No edema.  Skin:    General: Skin is warm and dry.     Comments: Rash to heel of left foot, flat and erythematous. No sings of infection.   Neurological:     General: No focal deficit present.     Mental Status: She is alert and oriented to person, place, and time.     Motor: No weakness.     Gait: Gait normal.  Psychiatric:        Mood and Affect: Mood normal.        Behavior: Behavior normal.    BP 129/76   Pulse 75   Temp (!) 97 F (36.1 C) (Temporal)   Resp 20   Ht _0  (1.626 m)   Wt 168 lb (76.2 kg)   SpO2 98%   BMI 28.84 kg/m  Wt Readings from Last 3 Encounters:  09/18/20 168 lb (76.2 kg)   08/23/20 167 lb 4 oz (75.9 kg)  07/04/20 158 lb (71.7 kg)     Health Maintenance Due  Topic Date Due   Zoster Vaccines- Shingrix (1 of 2) Never done   MAMMOGRAM  02/02/2011   COVID-19 Vaccine (4 - Booster for Moderna series) 05/29/2020   INFLUENZA VACCINE  09/18/2020    There are no preventive care reminders to display for this patient.  Lab Results  Component Value Date   TSH 0.941 03/29/2020   Lab Results  Component Value Date   WBC 13.6 (H) 06/27/2020   HGB 13.4 06/27/2020   HCT  39.0 06/27/2020   MCV 84 06/27/2020   PLT 245 06/27/2020   Lab Results  Component Value Date   NA 143 06/27/2020   NA 143 06/27/2020   K 4.5 06/27/2020   K 4.7 06/27/2020   CO2 27 06/27/2020   CO2 27 06/27/2020   GLUCOSE 70 06/27/2020   GLUCOSE 73 06/27/2020   BUN 11 06/27/2020   BUN 12 06/27/2020   CREATININE 0.89 06/27/2020   CREATININE 0.87 06/27/2020   BILITOT 0.4 06/27/2020   ALKPHOS 146 (H) 06/27/2020   AST 26 06/27/2020   ALT 18 06/27/2020   PROT 7.4 06/27/2020   ALBUMIN 4.2 06/27/2020   CALCIUM 9.2 06/27/2020   CALCIUM 9.7 06/27/2020   ANIONGAP 12 08/15/2017   EGFR 71 06/27/2020   EGFR 73 06/27/2020   Lab Results  Component Value Date   CHOL 190 06/27/2020   Lab Results  Component Value Date   HDL 74 06/27/2020   Lab Results  Component Value Date   LDLCALC 102 (H) 06/27/2020   Lab Results  Component Value Date   TRIG 77 06/27/2020   Lab Results  Component Value Date   CHOLHDL 2.6 06/27/2020   Lab Results  Component Value Date   HGBA1C 5.7 03/29/2020      Assessment & Plan:   Janelly was seen today for medical management of chronic issues.  Diagnoses and all orders for this visit:  Primary hypertension Well controlled on current regimen. Labs pending.  -     CBC with Differential/Platelet -     CMP14+EGFR -     Lipid panel  Controlled type 2 diabetes with neuropathy (HCC) A1c and labs pending. Reports never on medication for this.  -      CBC with Differential/Platelet -     CMP14+EGFR -     Lipid panel -     Bayer DCA Hb A1c Waived  Gastroesophageal reflux disease without esophagitis Well controlled on current regimen.  -     CBC with Differential/Platelet -     CMP14+EGFR  Depression, recurrent (HCC) Well controlled on current regimen.  -     DULoxetine (CYMBALTA) 60 MG capsule; Take 1 capsule (60 mg total) by mouth daily.  Generalized anxiety disorder Controlled substance agreement signed Well controlled on current regimen. No red flags. CSA signed and UTD.  -     LORazepam (ATIVAN) 0.5 MG tablet; Take 1 tablet (0.5 mg total) by mouth 2 (two) times daily as needed for anxiety. -     DULoxetine (CYMBALTA) 60 MG capsule; Take 1 capsule (60 mg total) by mouth daily.  Nausea -     ondansetron (ZOFRAN) 4 MG tablet; Take 1 tablet (4 mg total) by mouth every 8 (eight) hours as needed for nausea or vomiting.  Atherosclerosis of native artery of right lower extremity with intermittent claudication (HCC) On aspirin. Will see vascular in a few weeks.   Supraventricular tachycardia ([place]) Denies symptoms.   Partial seizure ([place]) Well controlled on current regimen.  -     lamoTRIgine (LAMICTAL) 200 MG tablet; Take 1 tablet (200 mg total) by mouth 2 (two) times daily.  Angina, class III ([place]) Denies symptoms. Will see cardiology in a few weeks.   Rash of foot Some improvement with nystatin- try lotrisone as below.  -     clotrimazole-betamethasone (LOTRISONE) cream; Apply 1 application topically daily.     Follow-up: Return in about 3 months (around 12/19/2020) for chronic follow up.   The patient  indicates understanding of these issues and agrees with the plan.    Gwenlyn Perking, FNP

## 2020-09-19 LAB — CBC WITH DIFFERENTIAL/PLATELET
Basophils Absolute: 0 10*3/uL (ref 0.0–0.2)
Basos: 0 %
EOS (ABSOLUTE): 0.2 10*3/uL (ref 0.0–0.4)
Eos: 2 %
Hematocrit: 41.6 % (ref 34.0–46.6)
Hemoglobin: 13.7 g/dL (ref 11.1–15.9)
Immature Grans (Abs): 0 10*3/uL (ref 0.0–0.1)
Immature Granulocytes: 0 %
Lymphocytes Absolute: 2.4 10*3/uL (ref 0.7–3.1)
Lymphs: 27 %
MCH: 28.8 pg (ref 26.6–33.0)
MCHC: 32.9 g/dL (ref 31.5–35.7)
MCV: 87 fL (ref 79–97)
Monocytes Absolute: 1.1 10*3/uL — ABNORMAL HIGH (ref 0.1–0.9)
Monocytes: 12 %
Neutrophils Absolute: 5.4 10*3/uL (ref 1.4–7.0)
Neutrophils: 59 %
Platelets: 180 10*3/uL (ref 150–450)
RBC: 4.76 x10E6/uL (ref 3.77–5.28)
RDW: 13.7 % (ref 11.7–15.4)
WBC: 9.1 10*3/uL (ref 3.4–10.8)

## 2020-09-19 LAB — CMP14+EGFR
ALT: 15 IU/L (ref 0–32)
AST: 29 IU/L (ref 0–40)
Albumin/Globulin Ratio: 1.8 (ref 1.2–2.2)
Albumin: 4.6 g/dL (ref 3.8–4.8)
Alkaline Phosphatase: 96 IU/L (ref 44–121)
BUN/Creatinine Ratio: 9 — ABNORMAL LOW (ref 12–28)
BUN: 8 mg/dL (ref 8–27)
Bilirubin Total: 0.4 mg/dL (ref 0.0–1.2)
CO2: 26 mmol/L (ref 20–29)
Calcium: 9.5 mg/dL (ref 8.7–10.3)
Chloride: 102 mmol/L (ref 96–106)
Creatinine, Ser: 0.86 mg/dL (ref 0.57–1.00)
Globulin, Total: 2.5 g/dL (ref 1.5–4.5)
Glucose: 85 mg/dL (ref 65–99)
Potassium: 4.5 mmol/L (ref 3.5–5.2)
Sodium: 143 mmol/L (ref 134–144)
Total Protein: 7.1 g/dL (ref 6.0–8.5)
eGFR: 74 mL/min/{1.73_m2} (ref >59)

## 2020-09-19 LAB — LIPID PANEL
Chol/HDL Ratio: 3.4 ratio (ref 0.0–4.4)
Cholesterol, Total: 229 mg/dL — ABNORMAL HIGH (ref 100–199)
HDL: 68 mg/dL (ref >39)
LDL Chol Calc (NIH): 136 mg/dL — ABNORMAL HIGH (ref 0–99)
Triglycerides: 144 mg/dL (ref 0–149)
VLDL Cholesterol Cal: 25 mg/dL (ref 5–40)

## 2020-09-20 ENCOUNTER — Other Ambulatory Visit: Payer: Self-pay | Admitting: Family Medicine

## 2020-09-20 DIAGNOSIS — R7303 Prediabetes: Secondary | ICD-10-CM | POA: Insufficient documentation

## 2020-09-20 DIAGNOSIS — E7841 Elevated Lipoprotein(a): Secondary | ICD-10-CM

## 2020-09-20 MED ORDER — ROSUVASTATIN CALCIUM 5 MG PO TABS: 5.0000 mg | ORAL_TABLET | Freq: Every day | ORAL | 3 refills | Status: DC

## 2020-09-20 NOTE — Progress Notes (Signed)
R/C Labs

## 2020-09-21 ENCOUNTER — Encounter: Payer: Self-pay | Admitting: Family Medicine

## 2020-09-25 ENCOUNTER — Other Ambulatory Visit: Payer: Self-pay | Admitting: Family Medicine

## 2020-09-26 ENCOUNTER — Other Ambulatory Visit (INDEPENDENT_AMBULATORY_CARE_PROVIDER_SITE_OTHER): Payer: Self-pay

## 2020-09-26 ENCOUNTER — Encounter (INDEPENDENT_AMBULATORY_CARE_PROVIDER_SITE_OTHER): Payer: Self-pay | Admitting: Internal Medicine

## 2020-09-26 ENCOUNTER — Other Ambulatory Visit: Payer: Self-pay

## 2020-09-26 ENCOUNTER — Ambulatory Visit (INDEPENDENT_AMBULATORY_CARE_PROVIDER_SITE_OTHER): Payer: Medicare HMO | Admitting: Internal Medicine

## 2020-09-26 ENCOUNTER — Encounter (INDEPENDENT_AMBULATORY_CARE_PROVIDER_SITE_OTHER): Payer: Self-pay

## 2020-09-26 VITALS — BP 129/82 | HR 91 | Temp 98.1°F | Ht 64.0 in | Wt 166.8 lb

## 2020-09-26 DIAGNOSIS — K7290 Hepatic failure, unspecified without coma: Secondary | ICD-10-CM

## 2020-09-26 DIAGNOSIS — K7460 Unspecified cirrhosis of liver: Secondary | ICD-10-CM

## 2020-09-26 DIAGNOSIS — K7682 Hepatic encephalopathy: Secondary | ICD-10-CM

## 2020-09-26 MED ORDER — ACETAMINOPHEN 500 MG PO TABS: 500.0000 mg | ORAL_TABLET | Freq: Four times a day (QID) | ORAL | 0 refills | Status: DC | PRN

## 2020-09-26 NOTE — Progress Notes (Signed)
Presenting complaint;  Follow for chronic liver disease  Database and subjective:  Patient is 70 year old Caucasian female who has history of fatty liver dating back to at least August 2018 and history of elevated transaminases since January 2013 who developed hepatic encephalopathy in June 2019 when she was transferred from [facility] to [place] patient was diagnosed with cirrhosis.  At that time she had mild ascites which has resolved on subsequent studies. She is advised to undergo studies to rule out other causes of liver disease but she has not had these to date. She also has not had hepatitis A and B vaccination as recommended. She was last seen on 03/28/2020.  She has no GI complaints.  She says her appetite is very good.  She has at least 3 bowel movements per day.  She has not had any episodes of confusion.  She states she sleeps 7 to 8 hours every night she goes to bed around 11 and gets up between 6 and 7:00.  She denies abdominal pain melena or rectal bleeding.  She continues to have throat discomfort.  She was seen by Dr. Benjamine Mola who felt that she had neuropathic pain and started on gabapentin.  She feels it might be helping.  She has follow-up visit on October 09, 2020. She continues to complain of back pain as well as neck pain.  She takes pain medication every day. Patient complains of being nervous.  She is wondering if I could write her prescription for anxiolytic.  I told her it would not be a good idea given that she is already on narcotic antidepressant and gabapentin. She she is also extremely upset that her children do not call her often or check on her.  She has 3 children.    Current Medications: Outpatient Encounter Medications as of 09/26/2020  Medication Sig   Acetaminophen (TYLENOL ARTHRITIS EXT RELIEF PO) Take 650 mg by mouth daily.    albuterol (VENTOLIN HFA) 108 (90 Base) MCG/ACT inhaler Inhale 2 puffs into the lungs every 4 (four) hours as needed for shortness of breath.    aspirin EC 81 MG tablet Take 1 tablet (81 mg total) by mouth daily.   baclofen (LIORESAL) 10 MG tablet Take 1 tablet (10 mg total) by mouth 3 (three) times daily.   carvedilol (COREG) 3.125 MG tablet Take 1 tablet (3.125 mg total) by mouth 2 (two) times daily with a meal.   cetirizine (ZYRTEC) 10 MG tablet Take 1 tablet (10 mg total) by mouth daily.   cholecalciferol (VITAMIN D) 1000 UNITS tablet Take 1,000 Units by mouth daily.   clotrimazole-betamethasone (LOTRISONE) cream Apply 1 application topically daily.   DULoxetine (CYMBALTA) 60 MG capsule Take 1 capsule (60 mg total) by mouth daily.   fluticasone (FLOVENT HFA) 44 MCG/ACT inhaler Inhale 2 puffs into the lungs 2 (two) times daily.   furosemide (LASIX) 40 MG tablet Take 1 tablet (40 mg total) by mouth 2 (two) times daily.   gabapentin (NEURONTIN) 300 MG capsule Take 300 mg by mouth at bedtime.   lactulose (CHRONULAC) 10 GM/15ML solution TAKE 30 MLS (20 GRAM TOTAL) BY MOUTH 3 TIMES DAILY.   lamoTRIgine (LAMICTAL) 200 MG tablet Take 1 tablet (200 mg total) by mouth 2 (two) times daily.   loperamide (IMODIUM) 2 MG capsule Take 2 tablets after the first loose stool, then 1 tablet after each loose stool. Max of 8 tablets per day.   LORazepam (ATIVAN) 0.5 MG tablet Take 1 tablet (0.5 mg total) by mouth  2 (two) times daily as needed for anxiety.   Methylcobalamin (B12-ACTIVE PO) daily.   nystatin cream (MYCOSTATIN) Apply 1 application topically 2 (two) times daily.   ondansetron (ZOFRAN) 4 MG tablet Take 1 tablet (4 mg total) by mouth every 8 (eight) hours as needed for nausea or vomiting.   Oxycodone HCl 10 MG TABS Take 10 mg by mouth 3 (three) times daily as needed.   pantoprazole (PROTONIX) 40 MG tablet Take 1 tablet (40 mg total) by mouth daily before breakfast.   Pediatric Multivitamins-Iron (FLINTSTONES PLUS IRON) chewable tablet Chew 1 tablet by mouth 2 (two) times daily.   potassium chloride SA (K-DUR,KLOR-CON) 20 MEQ tablet Take 20 mEq  by mouth daily.    rosuvastatin (CRESTOR) 5 MG tablet Take 1 tablet (5 mg total) by mouth daily.   sacubitril-valsartan (ENTRESTO) 24-26 MG Take 1 tablet by mouth 2 (two) times daily.   Facility-Administered Encounter Medications as of 09/26/2020  Medication   magic mouthwash     Objective: Blood pressure 129/82, pulse 91, temperature 98.1 F (36.7 C), temperature source Oral, height 5' 4"  (1.626 m), weight 166 lb 12.8 oz (75.7 kg). Patient is alert and in no acute distress. She does not have asterixis. Conjunctiva is pink. Sclera is nonicteric Oropharyngeal mucosa is normal. She has upper and lower dentures in place. Her chin is full but there is no adenopathy. No neck masses or thyromegaly noted. Cardiac exam with regular rhythm normal S1 and S2. No murmur or gallop noted. Lungs are clear to auscultation. Abdomen she has horizontal scar in right upper/mid abdomen.  On palpation abdomen is soft.  Spleen tip is palpable.  Liver edge is indistinct.  Shifting dullness absent. No LE edema or clubbing noted.  Labs/studies Results:   CBC Latest Ref Rng & Units 09/18/2020 06/27/2020 03/29/2020  WBC 3.4 - 10.8 x10E3/uL 9.1 13.6(H) 12.4(H)  Hemoglobin 11.1 - 15.9 g/dL 13.7 13.4 14.8  Hematocrit 34.0 - 46.6 % 41.6 39.0 44.3  Platelets 150 - 450 x10E3/uL 180 245 217    CMP Latest Ref Rng & Units 09/18/2020 06/27/2020 06/27/2020  Glucose 65 - 99 mg/dL 85 70 73  BUN 8 - 27 mg/dL 8 11 12   Creatinine 0.57 - 1.00 mg/dL 0.86 0.89 0.87  Sodium 134 - 144 mmol/L 143 143 143  Potassium 3.5 - 5.2 mmol/L 4.5 4.5 4.7  Chloride 96 - 106 mmol/L 102 101 101  CO2 20 - 29 mmol/L 26 27 27   Calcium 8.7 - 10.3 mg/dL 9.5 9.2 9.7  Total Protein 6.0 - 8.5 g/dL 7.1 7.4 -  Total Bilirubin 0.0 - 1.2 mg/dL 0.4 0.4 -  Alkaline Phos 44 - 121 IU/L 96 146(H) -  AST 0 - 40 IU/L 29 26 -  ALT 0 - 32 IU/L 15 18 -    Hepatic Function Latest Ref Rng & Units 09/18/2020 06/27/2020 03/29/2020  Total Protein 6.0 - 8.5 g/dL 7.1 7.4  7.3  Albumin 3.8 - 4.8 g/dL 4.6 4.2 4.7  AST 0 - 40 IU/L 29 26 35  ALT 0 - 32 IU/L 15 18 20   Alk Phosphatase 44 - 121 IU/L 96 146(H) 139(H)  Total Bilirubin 0.0 - 1.2 mg/dL 0.4 0.4 0.4  Bilirubin, Direct <=0.2 mg/dL - - -    Abdominal ultrasound on 04/04/2020  Hepatic contour consistent with cirrhosis.  No ascites.  Splenomegaly.  Patient also had elastography revealing median kPa score of 8.5(please note this study was not ordered and therefore patient was not charged)  Assessment:  #1.  Cirrhosis.  Etiology appears to be NASH but she has not had studies which are recommended back in February 2022.  Transaminases are normal and so his albumin.  It appears hepatic synthetic function is well-preserved. Ultrasound 6 months ago did not reveal ascites.  She did have small volume ascites back in June 2019 when she was hospitalized at [facility] with decompensation.  #2.  Hepatic encephalopathy.  Clinically she does not appear to be encephalopathic.  I am concerned about cognitive impairment as she is on multiple medications.  #3.  Tylenol use.  She may be taking 2600 mg of Tylenol daily.  She needs to keep dose to no more than 2 g/day.   Plan:  Tylenol/acetaminophen changed to 500 mg tablet to be taken up to 4 times a day and no more. Once again patient reminded to check with Ms. Marjorie Smolder, FNP about getting hepatitis A and B vaccination.  Will refer to [place] unless she can get vaccine at best to not family medicine. Patient will go to the lab either this month or next month for blood work that was ordered in February 2022 and she will also have INR and alpha-fetoprotein. Abdominal ultrasound with Doppler study to be scheduled in September 2022. Esophagogastroduodenoscopy to screen and band esophageal varices in November 2022. Office visit in 6 months.

## 2020-09-26 NOTE — Patient Instructions (Addendum)
Please check with Ms. Marjorie Smolder, FNP if her office to give you hepatitis A and B vaccination. Take Tylenol on as-needed basis.  Please do not exceed more than 2 g/day. Blood work i.e. INR and AFP to be done next month or with your next blood draw. Abdominal ultrasound to be scheduled in September 2022. Esophagogastroduodenoscopy to screen for varices to be scheduled in September 2022.

## 2020-09-28 ENCOUNTER — Other Ambulatory Visit: Payer: Self-pay | Admitting: Family Medicine

## 2020-09-28 DIAGNOSIS — Z1231 Encounter for screening mammogram for malignant neoplasm of breast: Secondary | ICD-10-CM

## 2020-09-29 ENCOUNTER — Ambulatory Visit: Payer: Medicare HMO | Admitting: Family Medicine

## 2020-10-02 ENCOUNTER — Other Ambulatory Visit (INDEPENDENT_AMBULATORY_CARE_PROVIDER_SITE_OTHER): Payer: Self-pay

## 2020-10-06 ENCOUNTER — Ambulatory Visit: Payer: Medicare HMO | Admitting: Physician Assistant

## 2020-10-06 ENCOUNTER — Other Ambulatory Visit: Payer: Self-pay

## 2020-10-06 ENCOUNTER — Ambulatory Visit (HOSPITAL_COMMUNITY)
Admission: RE | Admit: 2020-10-06 | Discharge: 2020-10-06 | Disposition: A | Discharge status: Home / Self Care | Payer: Medicare HMO | Source: Ambulatory Visit | Attending: Vascular Surgery | Admitting: Vascular Surgery

## 2020-10-06 VITALS — BP 121/75 | HR 62 | Temp 97.2°F | Resp 20 | Ht 64.0 in | Wt 169.9 lb

## 2020-10-06 DIAGNOSIS — Z9889 Other specified postprocedural states: Secondary | ICD-10-CM

## 2020-10-06 DIAGNOSIS — I6523 Occlusion and stenosis of bilateral carotid arteries: Secondary | ICD-10-CM

## 2020-10-06 NOTE — Progress Notes (Signed)
Office Note     CC:  follow up Requesting Provider:  Gwenlyn Perking, FNP  HPI: Carol Richmond is a 70 y.o. ([birth year]) female who presents to go over studies related to carotid artery stenosis.  She is status post right carotid endarterectomy by Dr. Trula Slade in 2019 due to symptomatic carotid stenosis with TIAs.  Since the last office visit 1 year ago she denies any neurological events including slurring speech, changes in vision, or one-sided weakness.  She is on aspirin and statin daily.  She follows regularly with her PCP for management of chronic medical conditions including type 2 diabetes mellitus, hypertension.  She denies tobacco use.   Past Medical History:  Diagnosis Date   Asthma    Carotid artery disease ([place])    Diverticulosis of colon (without mention of hemorrhage)    Essential hypertension    Functional ovarian cysts    GERD (gastroesophageal reflux disease)    Hemorrhoids, external, without mention of complication    Hormone replacement therapy (postmenopausal)    IBS (irritable bowel syndrome)    Migraine    Mitral valve disease    Right knee DJD    Seizures ([facility])    Sleep apnea    TIA (transient ischemic attack)    Type 2 diabetes mellitus ([place])    Type 2 diabetes mellitus with hyperglycemia, without long-term current use of insulin ([place]) 08/07/2017    Past Surgical History:  Procedure Laterality Date   BIOPSY  09/23/2018   Procedure: BIOPSY;  Surgeon: Rogene Houston, MD;  Location: [facility];  Service: Endoscopy;;  duodeneum and stomach   CHOLECYSTECTOMY  1980   ENDARTERECTOMY Right 07/01/2017   Procedure: ENDARTERECTOMY CAROTID RIGHT;  Surgeon: Serafina Mitchell, MD;  Location: [facility] OR;  Service: Vascular;  Laterality: Right;   ESOPHAGOGASTRODUODENOSCOPY N/A 09/23/2018   Procedure: ESOPHAGOGASTRODUODENOSCOPY (EGD);  Surgeon: Rogene Houston, MD;  Location: [facility];  Service: Endoscopy;  Laterality: N/A;  2:45-moved to 1:45 per Lelon Frohlich   LEFT HEART  CATHETERIZATION WITH CORONARY ANGIOGRAM N/A 11/18/2013   Procedure: LEFT HEART CATHETERIZATION WITH CORONARY ANGIOGRAM;  Surgeon: Burnell Blanks, MD;  Location: [facility] CATH LAB;  Service: Cardiovascular;  Laterality: N/A;   OOPHORECTOMY     2012 Dr. Adah Perl in [place] Right 07/01/2017   Procedure: RIGHT CAROTID PATCH ANGIOPLASTY;  Surgeon: Serafina Mitchell, MD;  Location: [place];  Service: Vascular;  Laterality: Right;   TONSILLECTOMY     TOTAL KNEE ARTHROPLASTY Right 09/02/2012   Procedure: TOTAL KNEE ARTHROPLASTY- right;  Surgeon: Ninetta Lights, MD;  Location: [place];  Service: Orthopedics;  Laterality: Right;   TUBAL LIGATION     TYMPANOMASTOIDECTOMY Left 04/02/2016   Procedure: LEFT CANAL WALL DOWN TYMPANOMASTOIDECTOMY;  Surgeon: Leta Baptist, MD;  Location: [place];  Service: ENT;  Laterality: Left;   [place] Right 07/02/2017   Procedure: RIGHT NECK EXPLORATION WITH INTRAOPERATIVE ULTRASOUND;  Surgeon: Angelia Mould, MD;  Location: [facility] OR;  Service: Vascular;  Laterality: Right;    Social History   Socioeconomic History   Marital status: Widowed    Spouse name: Not on file   Number of children: 3   Years of education: Not on file   Highest education level: Not on file  Occupational History   Not on file  Tobacco Use   Smoking status: Never   Smokeless tobacco: Never  Vaping Use   Vaping  Use: Never used  Substance and Sexual Activity   Alcohol use: No    Alcohol/week: 0.0 standard drinks   Drug use: No   Sexual activity: Not on file  Other Topics Concern   Not on file  Social History Narrative   Lives alone. Granddaughter lives across the street, her 3 children all live nearby   Social Determinants of Health   Financial Resource Strain: Low Risk    Difficulty of Paying Living Expenses: Not hard at all  Food Insecurity: No Food Insecurity   Worried About Charity fundraiser in the Last Year:  Never true   Arboriculturist in the Last Year: Never true  Transportation Needs: No Transportation Needs   Lack of Transportation (Medical): No   Lack of Transportation (Non-Medical): No  Physical Activity: Sufficiently Active   Days of Exercise per Week: 7 days   Minutes of Exercise per Session: 30 min  Stress: No Stress Concern Present   Feeling of Stress : Not at all  Social Connections: Moderately Integrated   Frequency of Communication with Friends and Family: More than three times a week   Frequency of Social Gatherings with Friends and Family: More than three times a week   Attends Religious Services: More than 4 times per year   Active Member of [employer] or Organizations: Yes   Attends Archivist Meetings: More than 4 times per year   Marital Status: Widowed  Human resources officer Violence: Not At Risk   Fear of Current or Ex-Partner: No   Emotionally Abused: No   Physically Abused: No   Sexually Abused: No    Family History  Problem Relation Age of Onset   Heart disease Mother    Diabetes Mother    Heart disease Brother    Cancer Sister    Heart disease Brother    Heart disease Brother    Heart disease Son     Current Outpatient Medications  Medication Sig Dispense Refill   acetaminophen (TYLENOL) 500 MG tablet Take 1 tablet (500 mg total) by mouth every 6 (six) hours as needed. 30 tablet 0   albuterol (VENTOLIN HFA) 108 (90 Base) MCG/ACT inhaler Inhale 2 puffs into the lungs every 4 (four) hours as needed for shortness of breath. 18 g 11   aspirin EC 81 MG tablet Take 1 tablet (81 mg total) by mouth daily.     baclofen (LIORESAL) 10 MG tablet Take 1 tablet (10 mg total) by mouth 3 (three) times daily. 30 each 2   carvedilol (COREG) 3.125 MG tablet Take 1 tablet (3.125 mg total) by mouth 2 (two) times daily with a meal. 180 tablet 1   cetirizine (ZYRTEC) 10 MG tablet Take 1 tablet (10 mg total) by mouth daily. 90 tablet 2   cholecalciferol (VITAMIN D) 1000 UNITS  tablet Take 1,000 Units by mouth daily.     clotrimazole-betamethasone (LOTRISONE) cream Apply 1 application topically daily. 30 g 0   DULoxetine (CYMBALTA) 60 MG capsule Take 1 capsule (60 mg total) by mouth daily. 30 capsule 5   fluticasone (FLOVENT HFA) 44 MCG/ACT inhaler Inhale 2 puffs into the lungs 2 (two) times daily. 1 each 12   furosemide (LASIX) 40 MG tablet Take 1 tablet (40 mg total) by mouth 2 (two) times daily. (Patient taking differently: Take 40 mg by mouth daily.) 60 tablet 1   gabapentin (NEURONTIN) 300 MG capsule Take 300 mg by mouth at bedtime.     lactulose ([place])  10 GM/15ML solution TAKE 30 MLS (20 GRAM TOTAL) BY MOUTH 3 TIMES DAILY. 2700 mL 5   lamoTRIgine (LAMICTAL) 200 MG tablet Take 1 tablet (200 mg total) by mouth 2 (two) times daily. 60 tablet 2   loperamide (IMODIUM) 2 MG capsule Take 2 tablets after the first loose stool, then 1 tablet after each loose stool. Max of 8 tablets per day. 30 capsule 0   LORazepam (ATIVAN) 0.5 MG tablet Take 1 tablet (0.5 mg total) by mouth 2 (two) times daily as needed for anxiety. 30 tablet 2   Methylcobalamin (B12-ACTIVE PO) daily.     naloxone (NARCAN) nasal spray 4 mg/0.1 mL      nystatin cream (MYCOSTATIN) Apply 1 application topically 2 (two) times daily. 30 g 0   ondansetron (ZOFRAN) 4 MG tablet Take 1 tablet (4 mg total) by mouth every 8 (eight) hours as needed for nausea or vomiting. 20 tablet 2   Oxycodone HCl 10 MG TABS Take 10 mg by mouth 3 (three) times daily as needed.     pantoprazole (PROTONIX) 40 MG tablet Take 1 tablet (40 mg total) by mouth daily before breakfast. 90 tablet 3   Pediatric Multivitamins-Iron (FLINTSTONES PLUS IRON) chewable tablet Chew 1 tablet by mouth 2 (two) times daily.     potassium chloride SA (K-DUR,KLOR-CON) 20 MEQ tablet Take 20 mEq by mouth daily.      rosuvastatin (CRESTOR) 5 MG tablet Take 1 tablet (5 mg total) by mouth daily. 90 tablet 3   sacubitril-valsartan (ENTRESTO) 24-26 MG Take 1  tablet by mouth 2 (two) times daily. 60 tablet 6   Current Facility-Administered Medications  Medication Dose Route Frequency Provider Last Rate Last Admin   magic mouthwash  5 mL Oral TID PRN Laurine Blazer B, PA-C        No Known Allergies   REVIEW OF SYSTEMS:   [X]  denotes positive finding, [ ]  denotes negative finding Cardiac  Comments:  Chest pain or chest pressure:    Shortness of breath upon exertion:    Short of breath when lying flat:    Irregular heart rhythm:        Vascular    Pain in calf, thigh, or hip brought on by ambulation:    Pain in feet at night that wakes you up from your sleep:     Blood clot in your veins:    Leg swelling:         Pulmonary    Oxygen at home:    Productive cough:     Wheezing:         Neurologic    Sudden weakness in arms or legs:     Sudden numbness in arms or legs:     Sudden onset of difficulty speaking or slurred speech:    Temporary loss of vision in one eye:     Problems with dizziness:         Gastrointestinal    Blood in stool:     Vomited blood:         Genitourinary    Burning when urinating:     Blood in urine:        Psychiatric    Major depression:         Hematologic    Bleeding problems:    Problems with blood clotting too easily:        Skin    Rashes or ulcers:        Constitutional  Fever or chills:      PHYSICAL EXAMINATION:  Vitals:   10/06/20 1120 10/06/20 1123  BP: 119/65 121/75  Pulse: 62   Resp: 20   Temp: (!) 97.2 F (36.2 C)   TempSrc: Temporal   SpO2: 97%   Weight: 169 lb 14.4 oz (77.1 kg)   Height: 5' 4"  (1.626 m)     General:  WDWN in NAD; vital signs documented above Gait: Not observed HENT: WNL, normocephalic Pulmonary: normal non-labored breathing , without Rales, rhonchi,  wheezing Cardiac: regular HR Abdomen: soft, NT, no masses Skin: without rashes Vascular Exam/Pulses:  Right Left  Radial 1+ (weak) 2+ (normal)   Extremities: without ischemic changes,  without Gangrene , without cellulitis; without open wounds;  Musculoskeletal: no muscle wasting or atrophy  Neurologic: A&O X 3;  CN grossly intact Psychiatric:  The pt has Normal affect.   Non-Invasive Vascular Imaging:   Carotid duplex demonstrates 1 to 39% stenosis of bilateral internal carotid arteries    ASSESSMENT/PLAN:: 70 y.o. female here for follow up for surveillance of carotid artery stenosis  -Subjectively patient has not experienced any neurological symptoms since last office visit including slurring speech, changes in vision, or one-sided weakness -Carotid duplex demonstrates a widely patent right carotid endarterectomy site; left ICA estimated to be 1 to 39% stenosis -Continue aspirin and statin daily -Recheck carotid duplex in 1 year   Dagoberto Ligas, PA-C [facility] [phone]  Clinic MD:   Donzetta Matters

## 2020-10-09 DIAGNOSIS — R070 Pain in throat: Secondary | ICD-10-CM | POA: Diagnosis not present

## 2020-10-30 ENCOUNTER — Other Ambulatory Visit (INDEPENDENT_AMBULATORY_CARE_PROVIDER_SITE_OTHER): Payer: Self-pay | Admitting: *Deleted

## 2020-10-30 ENCOUNTER — Ambulatory Visit
Admission: RE | Admit: 2020-10-30 | Discharge: 2020-10-30 | Disposition: A | Discharge status: Home / Self Care | Payer: Medicare HMO | Source: Ambulatory Visit | Attending: Family Medicine | Admitting: Family Medicine

## 2020-10-30 ENCOUNTER — Other Ambulatory Visit: Payer: Self-pay

## 2020-10-30 DIAGNOSIS — K7290 Hepatic failure, unspecified without coma: Secondary | ICD-10-CM | POA: Diagnosis not present

## 2020-10-30 DIAGNOSIS — Z1231 Encounter for screening mammogram for malignant neoplasm of breast: Secondary | ICD-10-CM | POA: Diagnosis not present

## 2020-10-30 DIAGNOSIS — D509 Iron deficiency anemia, unspecified: Secondary | ICD-10-CM | POA: Diagnosis not present

## 2020-10-30 DIAGNOSIS — K7460 Unspecified cirrhosis of liver: Secondary | ICD-10-CM

## 2020-10-30 DIAGNOSIS — D6489 Other specified anemias: Secondary | ICD-10-CM

## 2020-10-30 DIAGNOSIS — K7682 Hepatic encephalopathy: Secondary | ICD-10-CM

## 2020-10-30 DIAGNOSIS — K279 Peptic ulcer, site unspecified, unspecified as acute or chronic, without hemorrhage or perforation: Secondary | ICD-10-CM | POA: Diagnosis not present

## 2020-10-30 DIAGNOSIS — K760 Fatty (change of) liver, not elsewhere classified: Secondary | ICD-10-CM

## 2020-10-30 DIAGNOSIS — K219 Gastro-esophageal reflux disease without esophagitis: Secondary | ICD-10-CM

## 2020-10-31 LAB — PROTIME-INR
INR: 1
Prothrombin Time: 10.3 s (ref 9.0–11.5)

## 2020-10-31 LAB — AFP TUMOR MARKER: AFP-Tumor Marker: 3.5 ng/mL

## 2020-11-02 ENCOUNTER — Telehealth: Payer: Self-pay | Admitting: Family Medicine

## 2020-11-02 LAB — MITOCHONDRIAL ANTIBODIES: Mitochondrial M2 Ab, IgG: <=20 U

## 2020-11-02 LAB — HEPATITIS A ANTIBODY, TOTAL: Hepatitis A AB,Total: REACTIVE — AB

## 2020-11-02 LAB — ANA: Anti Nuclear Antibody (ANA): NEGATIVE

## 2020-11-02 LAB — ALPHA-1-ANTITRYPSIN: A-1 Antitrypsin, Ser: 165 mg/dL (ref 83–199)

## 2020-11-02 LAB — HEPATITIS B SURFACE ANTIGEN: Hepatitis B Surface Ag: NONREACTIVE

## 2020-11-02 LAB — HEPATITIS B SURFACE ANTIBODY,QUALITATIVE: Hep B S Ab: NONREACTIVE

## 2020-11-02 LAB — HEPATITIS C ANTIBODY
Hepatitis C Ab: NONREACTIVE
SIGNAL TO CUT-OFF: 0.03 (ref <1.00)

## 2020-11-02 LAB — ANTI-SMOOTH MUSCLE ANTIBODY, IGG: Actin (Smooth Muscle) Antibody (IGG): <20 U (ref <20)

## 2020-11-02 MED ORDER — CARVEDILOL 3.125 MG PO TABS: 3.1250 mg | ORAL_TABLET | Freq: Two times a day (BID) | ORAL | 1 refills | Status: DC

## 2020-11-02 NOTE — Telephone Encounter (Signed)
  Prescription Request  11/02/2020  Is this a "Controlled Substance" medicine? no Have you seen your PCP in the last 2 weeks? No she was seen on 09/18/2020 by TIffany If YES, route message to pool  -  If NO, patient needs to be scheduled for appointment.  What is the name of the medication or equipment?carvedilol (COREG) 3.125 MG tablet  Have you contacted your pharmacy to request a refill? yes  Which pharmacy would you like this sent to? [facility]    Patient notified that their request is being sent to the clinical staff for review and that they should receive a response within 2 business days.

## 2020-11-08 ENCOUNTER — Other Ambulatory Visit: Payer: Self-pay

## 2020-11-08 ENCOUNTER — Ambulatory Visit (HOSPITAL_COMMUNITY)
Admission: RE | Admit: 2020-11-08 | Discharge: 2020-11-08 | Disposition: A | Discharge status: Home / Self Care | Payer: Medicare HMO | Source: Ambulatory Visit | Attending: Internal Medicine | Admitting: Internal Medicine

## 2020-11-08 DIAGNOSIS — K7460 Unspecified cirrhosis of liver: Secondary | ICD-10-CM | POA: Diagnosis not present

## 2020-11-08 DIAGNOSIS — Q8909 Congenital malformations of spleen: Secondary | ICD-10-CM | POA: Diagnosis not present

## 2020-11-08 DIAGNOSIS — K7689 Other specified diseases of liver: Secondary | ICD-10-CM | POA: Diagnosis not present

## 2020-11-20 ENCOUNTER — Encounter: Payer: Self-pay | Admitting: Family Medicine

## 2020-11-20 ENCOUNTER — Other Ambulatory Visit: Payer: Self-pay

## 2020-11-20 ENCOUNTER — Ambulatory Visit (INDEPENDENT_AMBULATORY_CARE_PROVIDER_SITE_OTHER): Payer: Medicare HMO | Admitting: Family Medicine

## 2020-11-20 VITALS — BP 126/68 | HR 66 | Temp 96.4°F | Ht 64.0 in | Wt 175.5 lb

## 2020-11-20 DIAGNOSIS — K7460 Unspecified cirrhosis of liver: Secondary | ICD-10-CM

## 2020-11-20 DIAGNOSIS — Z23 Encounter for immunization: Secondary | ICD-10-CM

## 2020-11-20 DIAGNOSIS — F411 Generalized anxiety disorder: Secondary | ICD-10-CM | POA: Diagnosis not present

## 2020-11-20 DIAGNOSIS — I10 Essential (primary) hypertension: Secondary | ICD-10-CM | POA: Diagnosis not present

## 2020-11-20 MED ORDER — FUROSEMIDE 40 MG PO TABS: 40.0000 mg | ORAL_TABLET | Freq: Every day | ORAL | 1 refills | Status: DC

## 2020-11-20 NOTE — Progress Notes (Signed)
Established Patient Office Visit  Subjective:  [name], female    DOB: 1950-09-23  Age: 70 y.o. MRN: [id number]  CC:  Chief Complaint  Patient presents with   Medical Management of Chronic Issues    HPI Carol Richmond presents for HTN follow up. She needs refills on lasix today. She also reports that her GI told her to follow up with me, she isn't sure what for.   HTN Complaint with meds - Yes Current Medications - coreg, lasix, entresto Exercising Regularly - No Watching Salt intake - Yes Pertinent ROS:  Headache - No Fatigue - No Visual Disturbances - No Chest pain - No Dyspnea - No Palpitations - No LE edema - No They report good compliance with medications and can restate their regimen by memory. No medication side effects.  Review of GI notes show that it was recommended that patient be vaccinated against Hepatitis B as she is nonimmune. Discussed indications for vaccination today. Billing was unable to determine the cost of vaccination today for the patient. She will call her insurance to discuss and follow up once she knows how much this will cost.   Depression screen [facility] 2/9 11/20/2020 09/18/2020 07/04/2020  Decreased Interest 0 1 0  Down, Depressed, Hopeless 1 2 0  PHQ - 2 Score 1 3 0  Altered sleeping 1 0 0  Tired, decreased energy 1 1 0  Change in appetite 0 0 0  Feeling bad or failure about yourself  0 0 0  Trouble concentrating 0 0 0  Moving slowly or fidgety/restless 0 0 0  Suicidal thoughts 0 0 0  PHQ-9 Score 3 4 0  Difficult doing work/chores Not difficult at all Not difficult at all Not difficult at all  Some recent data might be hidden   GAD 7 : Generalized Anxiety Score 11/20/2020 09/18/2020 06/27/2020 04/13/2020  Nervous, Anxious, on Edge 1 0 1 2  Control/stop worrying 0 1 0 0  Worry too much - different things 0 1 1 0  Trouble relaxing 0 0 0 0  Restless 0 0 0 0  Easily annoyed or irritable 0 0 0 0  Afraid - awful might  happen 0 0 0 0  Total GAD 7 Score _0 Anxiety Difficulty Not difficult at all Not difficult at all Not difficult at all -     BP Readings from Last 3 Encounters:  11/20/20 126/68  10/06/20 121/75  09/26/20 129/82   CMP Latest Ref Rng & Units 09/18/2020 06/27/2020 06/27/2020  Glucose 65 - 99 mg/dL 85 70 73  BUN 8 - 27 mg/dL _1 Creatinine 0.57 - 1.00 mg/dL 0.86 0.89 0.87  Sodium 134 - 144 mmol/L 143 143 143  Potassium 3.5 - 5.2 mmol/L 4.5 4.5 4.7  Chloride 96 - 106 mmol/L 102 101 101  CO2 20 - 29 mmol/L _2 Calcium 8.7 - 10.3 mg/dL 9.5 9.2 9.7  Total Protein 6.0 - 8.5 g/dL 7.1 7.4 -  Total Bilirubin 0.0 - 1.2 mg/dL 0.4 0.4 -  Alkaline Phos 44 - 121 IU/L 96 146(H) -  AST 0 - 40 IU/L 29 26 -  ALT 0 - 32 IU/L 15 18 -         Past Medical History:  Diagnosis Date   Asthma    Carotid artery disease (HCC)    Diverticulosis of colon (without mention of hemorrhage)    Essential hypertension  Functional ovarian cysts    GERD (gastroesophageal reflux disease)    Hemorrhoids, external, without mention of complication    Hormone replacement therapy (postmenopausal)    IBS (irritable bowel syndrome)    Migraine    Mitral valve disease    Right knee DJD    Seizures (HCC)    Sleep apnea    TIA (transient ischemic attack)    Type 2 diabetes mellitus ([place])    Type 2 diabetes mellitus with hyperglycemia, without long-term current use of insulin ([place]) 08/07/2017    Past Surgical History:  Procedure Laterality Date   BIOPSY  09/23/2018   Procedure: BIOPSY;  Surgeon: Rogene Houston, MD;  [facility]: [facility];  Service: Endoscopy;;  duodeneum and stomach   CHOLECYSTECTOMY  1980   ENDARTERECTOMY Right 07/01/2017   Procedure: ENDARTERECTOMY CAROTID RIGHT;  Surgeon: Serafina Mitchell, MD;  Location: [facility] OR;  Service: Vascular;  Laterality: Right;   ESOPHAGOGASTRODUODENOSCOPY N/A 09/23/2018   Procedure: ESOPHAGOGASTRODUODENOSCOPY (EGD);  Surgeon: Rogene Houston, MD;   Location: [facility];  Service: Endoscopy;  Laterality: N/A;  2:45-moved to 1:45 per Lelon Frohlich   LEFT HEART CATHETERIZATION WITH CORONARY ANGIOGRAM N/A 11/18/2013   Procedure: LEFT HEART CATHETERIZATION WITH CORONARY ANGIOGRAM;  Surgeon: Burnell Blanks, MD;  Location: [facility] CATH LAB;  Service: Cardiovascular;  Laterality: N/A;   OOPHORECTOMY     2012 Dr. Adah Perl in [place] Right 07/01/2017   Procedure: RIGHT CAROTID PATCH ANGIOPLASTY;  Surgeon: Serafina Mitchell, MD;  Location: [place];  Service: Vascular;  Laterality: Right;   TONSILLECTOMY     TOTAL KNEE ARTHROPLASTY Right 09/02/2012   Procedure: TOTAL KNEE ARTHROPLASTY- right;  Surgeon: Ninetta Lights, MD;  Location: [place];  Service: Orthopedics;  Laterality: Right;   TUBAL LIGATION     TYMPANOMASTOIDECTOMY Left 04/02/2016   Procedure: LEFT CANAL WALL DOWN TYMPANOMASTOIDECTOMY;  Surgeon: Leta Baptist, MD;  Location: [place];  Service: ENT;  Laterality: Left;   VAGINAL HYSTERECTOMY  1990   WOUND EXPLORATION Right 07/02/2017   Procedure: RIGHT NECK EXPLORATION WITH INTRAOPERATIVE ULTRASOUND;  Surgeon: Angelia Mould, MD;  Location: [facility] OR;  Service: Vascular;  Laterality: Right;    Family History  Problem Relation Age of Onset   Heart disease Mother    Diabetes Mother    Heart disease Brother    Cancer Sister    Heart disease Brother    Heart disease Brother    Heart disease Son     Social History   Socioeconomic History   Marital status: Widowed    Spouse name: Not on file   Number of children: 3   Years of education: Not on file   Highest education level: Not on file  Occupational History   Not on file  Tobacco Use   Smoking status: Never   Smokeless tobacco: Never  Vaping Use   Vaping Use: Never used  Substance and Sexual Activity   Alcohol use: No    Alcohol/week: 0.0 standard drinks   Drug use: No   Sexual activity: Not on file  Other Topics Concern   Not on file  Social  History Narrative   Lives alone. Granddaughter lives across the street, her 3 children all live nearby   Social Determinants of Health   Financial Resource Strain: Low Risk    Difficulty of Paying Living Expenses: Not hard at all  Food Insecurity: No Food Insecurity   Worried About Running Out of  Food in the Last Year: Never true   [place] in the Last Year: Never true  Transportation Needs: No Transportation Needs   Lack of Transportation (Medical): No   Lack of Transportation (Non-Medical): No  Physical Activity: Sufficiently Active   Days of Exercise per Week: 7 days   Minutes of Exercise per Session: 30 min  Stress: No Stress Concern Present   Feeling of Stress : Not at all  Social Connections: Moderately Integrated   Frequency of Communication with Friends and Family: More than three times a week   Frequency of Social Gatherings with Friends and Family: More than three times a week   Attends Religious Services: More than 4 times per year   Active Member of [employer] or Organizations: Yes   Attends Archivist Meetings: More than 4 times per year   Marital Status: Widowed  Human resources officer Violence: Not At Risk   Fear of Current or Ex-Partner: No   Emotionally Abused: No   Physically Abused: No   Sexually Abused: No    Outpatient Medications Prior to Visit  Medication Sig Dispense Refill   acetaminophen (TYLENOL) 500 MG tablet Take 1 tablet (500 mg total) by mouth every 6 (six) hours as needed. 30 tablet 0   albuterol (VENTOLIN HFA) 108 (90 Base) MCG/ACT inhaler Inhale 2 puffs into the lungs every 4 (four) hours as needed for shortness of breath. 18 g 11   aspirin EC 81 MG tablet Take 1 tablet (81 mg total) by mouth daily.     baclofen (LIORESAL) 10 MG tablet Take 1 tablet (10 mg total) by mouth 3 (three) times daily. 30 each 2   carvedilol (COREG) 3.125 MG tablet Take 1 tablet (3.125 mg total) by mouth 2 (two) times daily with a meal. 180 tablet 1   cetirizine  (ZYRTEC) 10 MG tablet Take 1 tablet (10 mg total) by mouth daily. 90 tablet 2   cholecalciferol (VITAMIN D) 1000 UNITS tablet Take 1,000 Units by mouth daily.     clotrimazole-betamethasone (LOTRISONE) cream Apply 1 application topically daily. 30 g 0   DULoxetine (CYMBALTA) 60 MG capsule Take 1 capsule (60 mg total) by mouth daily. 30 capsule 5   fluticasone (FLOVENT HFA) 44 MCG/ACT inhaler Inhale 2 puffs into the lungs 2 (two) times daily. 1 each 12   furosemide (LASIX) 40 MG tablet Take 1 tablet (40 mg total) by mouth 2 (two) times daily. (Patient taking differently: Take 40 mg by mouth daily.) 60 tablet 1   gabapentin (NEURONTIN) 300 MG capsule Take 300 mg by mouth at bedtime.     lactulose (CHRONULAC) 10 GM/15ML solution TAKE 30 MLS (20 GRAM TOTAL) BY MOUTH 3 TIMES DAILY. 2700 mL 5   lamoTRIgine (LAMICTAL) 200 MG tablet Take 1 tablet (200 mg total) by mouth 2 (two) times daily. 60 tablet 2   loperamide (IMODIUM) 2 MG capsule Take 2 tablets after the first loose stool, then 1 tablet after each loose stool. Max of 8 tablets per day. 30 capsule 0   LORazepam (ATIVAN) 0.5 MG tablet Take 1 tablet (0.5 mg total) by mouth 2 (two) times daily as needed for anxiety. 30 tablet 2   Methylcobalamin (B12-ACTIVE PO) daily.     naloxone (NARCAN) nasal spray 4 mg/0.1 mL      nystatin cream (MYCOSTATIN) Apply 1 application topically 2 (two) times daily. 30 g 0   ondansetron (ZOFRAN) 4 MG tablet Take 1 tablet (4 mg total) by mouth every  8 (eight) hours as needed for nausea or vomiting. 20 tablet 2   Oxycodone HCl 10 MG TABS Take 10 mg by mouth 3 (three) times daily as needed.     pantoprazole (PROTONIX) 40 MG tablet Take 1 tablet (40 mg total) by mouth daily before breakfast. 90 tablet 3   Pediatric Multivitamins-Iron (FLINTSTONES PLUS IRON) chewable tablet Chew 1 tablet by mouth 2 (two) times daily.     potassium chloride SA (K-DUR,KLOR-CON) 20 MEQ tablet Take 20 mEq by mouth daily.      sacubitril-valsartan  (ENTRESTO) 24-26 MG Take 1 tablet by mouth 2 (two) times daily. 60 tablet 6   rosuvastatin (CRESTOR) 5 MG tablet Take 1 tablet (5 mg total) by mouth daily. (Patient not taking: Reported on 11/20/2020) 90 tablet 3   Facility-Administered Medications Prior to Visit  Medication Dose Route Frequency Provider Last Rate Last Admin   magic mouthwash  5 mL Oral TID PRN Laurine Blazer B, PA-C        No Known Allergies  ROS Review of Systems As per HPI.    Objective:    Physical Exam Vitals and nursing note reviewed.  Constitutional:      General: She is not in acute distress.    Appearance: She is not ill-appearing, toxic-appearing or diaphoretic.  Cardiovascular:     Rate and Rhythm: Normal rate and regular rhythm.     Heart sounds: Normal heart sounds. No murmur heard. Pulmonary:     Effort: Pulmonary effort is normal. No respiratory distress.     Breath sounds: Normal breath sounds.  Abdominal:     General: Bowel sounds are normal. There is no distension.     Palpations: Abdomen is soft.     Tenderness: There is no abdominal tenderness. There is no guarding or rebound.  Musculoskeletal:     Right lower leg: No edema.     Left lower leg: No edema.  Skin:    General: Skin is warm and dry.  Neurological:     Mental Status: She is alert and oriented to person, place, and time. Mental status is at baseline.    BP 126/68   Pulse 66   Temp (!) 96.4 F (35.8 C) (Temporal)   Ht _0  (1.626 m)   Wt 175 lb 8 oz (79.6 kg)   BMI 30.12 kg/m  Wt Readings from Last 3 Encounters:  11/20/20 175 lb 8 oz (79.6 kg)  10/06/20 169 lb 14.4 oz (77.1 kg)  09/26/20 166 lb 12.8 oz (75.7 kg)     Health Maintenance Due  Topic Date Due   INFLUENZA VACCINE  09/18/2020    There are no preventive care reminders to display for this patient.  Lab Results  Component Value Date   TSH 0.941 03/29/2020   Lab Results  Component Value Date   WBC 9.1 09/18/2020   HGB 13.7 09/18/2020   HCT 41.6  09/18/2020   MCV 87 09/18/2020   PLT 180 09/18/2020   Lab Results  Component Value Date   NA 143 09/18/2020   K 4.5 09/18/2020   CO2 26 09/18/2020   GLUCOSE 85 09/18/2020   BUN 8 09/18/2020   CREATININE 0.86 09/18/2020   BILITOT 0.4 09/18/2020   ALKPHOS 96 09/18/2020   AST 29 09/18/2020   ALT 15 09/18/2020   PROT 7.1 09/18/2020   ALBUMIN 4.6 09/18/2020   CALCIUM 9.5 09/18/2020   ANIONGAP 12 08/15/2017   EGFR 74 09/18/2020   Lab Results  Component  Value Date   CHOL 229 (H) 09/18/2020   Lab Results  Component Value Date   HDL 68 09/18/2020   Lab Results  Component Value Date   LDLCALC 136 (H) 09/18/2020   Lab Results  Component Value Date   TRIG 144 09/18/2020   Lab Results  Component Value Date   CHOLHDL 3.4 09/18/2020   Lab Results  Component Value Date   HGBA1C 6.1 09/18/2020      Assessment & Plan:   Carol Richmond was seen today for medical management of chronic issues.  Diagnoses and all orders for this visit:  Primary hypertension Well controlled on current regimen. Refill provided.   -     furosemide (LASIX) 40 MG tablet; Take 1 tablet (40 mg total) by mouth daily.  Cirrhosis, nonalcoholic ([place]) Managed by GI. Stable per last noted.   Need for hepatitis B vaccination Non immune per labs drawn by GI. Patient will contact insurance to see how much vaccine will cost. Will notify our office if she would like to receive the vaccine here.   Need for immunization against influenza Vaccine today in office.  -     Flu Vaccine QUAD High Dose(Fluad)  Generalized anxiety disorder Well controlled on current regimen. Declined counseling today.    Follow-up: Return in about 29 days (around 12/19/2020) for chronic follow up.  The patient indicates understanding of these issues and agrees with the plan.    Gwenlyn Perking, FNP

## 2020-11-20 NOTE — Patient Instructions (Signed)
Call insurance to check on Engerix vaccine price (Hepatitis B)  Hypertension, Adult High blood pressure (hypertension) is when the force of blood pumping through the arteries is too strong. The arteries are the blood vessels that carry blood from the heart throughout the body. Hypertension forces the heart to work harder to pump blood and may cause arteries to become narrow or stiff. Untreated or uncontrolled hypertension can cause a heart attack, heart failure, a stroke, kidney disease, and other problems. A blood pressure reading consists of a higher number over a lower number. Ideally, your blood pressure should be below 120/80. The first ("top") number is called the systolic pressure. It is a measure of the pressure in your arteries as your heart beats. The second ("bottom") number is called the diastolic pressure. It is a measure of the pressure in your arteries as the heart relaxes. What are the causes? The exact cause of this condition is not known. There are some conditions that result in or are related to high blood pressure. What increases the risk? Some risk factors for high blood pressure are under your control. The following factors may make you more likely to develop this condition: Smoking. Having type 2 diabetes mellitus, high cholesterol, or both. Not getting enough exercise or physical activity. Being overweight. Having too much fat, sugar, calories, or salt (sodium) in your diet. Drinking too much alcohol. Some risk factors for high blood pressure may be difficult or impossible to change. Some of these factors include: Having chronic kidney disease. Having a family history of high blood pressure. Age. Risk increases with age. Race. You may be at higher risk if you are African American. Gender. Men are at higher risk than women before age 102. After age 53, women are at higher risk than men. Having obstructive sleep apnea. Stress. What are the signs or symptoms? High blood  pressure may not cause symptoms. Very high blood pressure (hypertensive crisis) may cause: Headache. Anxiety. Shortness of breath. Nosebleed. Nausea and vomiting. Vision changes. Severe chest pain. Seizures. How is this diagnosed? This condition is diagnosed by measuring your blood pressure while you are seated, with your arm resting on a flat surface, your legs uncrossed, and your feet flat on the floor. The cuff of the blood pressure monitor will be placed directly against the skin of your upper arm at the level of your heart. It should be measured at least twice using the same arm. Certain conditions can cause a difference in blood pressure between your right and left arms. Certain factors can cause blood pressure readings to be lower or higher than normal for a short period of time: When your blood pressure is higher when you are in a health care provider's office than when you are at home, this is called white coat hypertension. Most people with this condition do not need medicines. When your blood pressure is higher at home than when you are in a health care provider's office, this is called masked hypertension. Most people with this condition may need medicines to control blood pressure. If you have a high blood pressure reading during one visit or you have normal blood pressure with other risk factors, you may be asked to: Return on a different day to have your blood pressure checked again. Monitor your blood pressure at home for 1 week or longer. If you are diagnosed with hypertension, you may have other blood or imaging tests to help your health care provider understand your overall risk for other  conditions. How is this treated? This condition is treated by making healthy lifestyle changes, such as eating healthy foods, exercising more, and reducing your alcohol intake. Your health care provider may prescribe medicine if lifestyle changes are not enough to get your blood pressure under  control, and if: Your systolic blood pressure is above 130. Your diastolic blood pressure is above 80. Your personal target blood pressure may vary depending on your medical conditions, your age, and other factors. Follow these instructions at home: Eating and drinking  Eat a diet that is high in fiber and potassium, and low in sodium, added sugar, and fat. An example eating plan is called the DASH (Dietary Approaches to Stop Hypertension) diet. To eat this way: Eat plenty of fresh fruits and vegetables. Try to fill one half of your plate at each meal with fruits and vegetables. Eat whole grains, such as whole-wheat pasta, brown rice, or whole-grain bread. Fill about one fourth of your plate with whole grains. Eat or drink low-fat dairy products, such as skim milk or low-fat yogurt. Avoid fatty cuts of meat, processed or cured meats, and poultry with skin. Fill about one fourth of your plate with lean proteins, such as fish, chicken without skin, beans, eggs, or tofu. Avoid pre-made and processed foods. These tend to be higher in sodium, added sugar, and fat. Reduce your daily sodium intake. Most people with hypertension should eat less than 1,500 mg of sodium a day. Do not drink alcohol if: Your health care provider tells you not to drink. You are pregnant, may be pregnant, or are planning to become pregnant. If you drink alcohol: Limit how much you use to: 0-1 drink a day for women. 0-2 drinks a day for men. Be aware of how much alcohol is in your drink. In the [place] one drink equals one 12 oz bottle of beer (355 mL), one 5 oz glass of wine (148 mL), or one 1 oz glass of hard liquor (44 mL). Lifestyle  Work with your health care provider to maintain a healthy body weight or to lose weight. Ask what an ideal weight is for you. Get at least 30 minutes of exercise most days of the week. Activities may include walking, swimming, or biking. Include exercise to strengthen your muscles  (resistance exercise), such as Pilates or lifting weights, as part of your weekly exercise routine. Try to do these types of exercises for 30 minutes at least 3 days a week. Do not use any products that contain nicotine or tobacco, such as cigarettes, e-cigarettes, and chewing tobacco. If you need help quitting, ask your health care provider. Monitor your blood pressure at home as told by your health care provider. Keep all follow-up visits as told by your health care provider. This is important. Medicines Take over-the-counter and prescription medicines only as told by your health care provider. Follow directions carefully. Blood pressure medicines must be taken as prescribed. Do not skip doses of blood pressure medicine. Doing this puts you at risk for problems and can make the medicine less effective. Ask your health care provider about side effects or reactions to medicines that you should watch for. Contact a health care provider if you: Think you are having a reaction to a medicine you are taking. Have headaches that keep coming back (recurring). Feel dizzy. Have swelling in your ankles. Have trouble with your vision. Get help right away if you: Develop a severe headache or confusion. Have unusual weakness or numbness. Feel faint. Have  severe pain in your chest or abdomen. Vomit repeatedly. Have trouble breathing. Summary Hypertension is when the force of blood pumping through your arteries is too strong. If this condition is not controlled, it may put you at risk for serious complications. Your personal target blood pressure may vary depending on your medical conditions, your age, and other factors. For most people, a normal blood pressure is less than 120/80. Hypertension is treated with lifestyle changes, medicines, or a combination of both. Lifestyle changes include losing weight, eating a healthy, low-sodium diet, exercising more, and limiting alcohol. This information is not  intended to replace advice given to you by your health care provider. Make sure you discuss any questions you have with your health care provider. Document Revised: 10/15/2017 Document Reviewed: 10/15/2017 Elsevier Patient Education  [place].

## 2020-11-21 ENCOUNTER — Telehealth (INDEPENDENT_AMBULATORY_CARE_PROVIDER_SITE_OTHER): Payer: Self-pay | Admitting: *Deleted

## 2020-11-21 NOTE — Telephone Encounter (Signed)
Pt had question if she needed Hep A or Hep B vaccine. Looked in chart and she needs Hep B. Explained to pt. Pt states she wants me to talk with [name] and let her know and will have her call me.

## 2020-11-21 NOTE — Telephone Encounter (Signed)
Pt left vm that she wanted dr Laural Golden or his nurse to call her back that she had some questions. I called pt to discuss and no answer. Left message to return call.  [phone]

## 2020-11-22 NOTE — Telephone Encounter (Signed)
Called and read to pt's daughter dr Olevia Perches notes on pt's bw and she verbalized understanding.

## 2020-11-27 ENCOUNTER — Other Ambulatory Visit: Payer: Self-pay | Admitting: *Deleted

## 2020-11-27 MED ORDER — ENTRESTO 24-26 MG PO TABS: 1.0000 | ORAL_TABLET | Freq: Two times a day (BID) | ORAL | 3 refills | Status: DC

## 2020-11-30 ENCOUNTER — Telehealth: Payer: Self-pay | Admitting: Family Medicine

## 2020-12-01 NOTE — Telephone Encounter (Signed)
Carol Richmond duty is an important part of our civic duty as [place] citizens. I will not provide a letter to be excused due to anxiety as her anxiety is controlled and does not constitute a reason to be excused from jury duty.

## 2020-12-04 ENCOUNTER — Encounter: Payer: Self-pay | Admitting: *Deleted

## 2020-12-04 NOTE — Progress Notes (Signed)
Fax notification received from [employer] that entresto 24/26 mg approved through remainder of calendar year 02/17/2021

## 2020-12-12 ENCOUNTER — Telehealth (INDEPENDENT_AMBULATORY_CARE_PROVIDER_SITE_OTHER): Payer: Self-pay | Admitting: Internal Medicine

## 2020-12-12 NOTE — Telephone Encounter (Signed)
Please call patient regarding her procedure - ph# [phone]

## 2020-12-12 NOTE — Telephone Encounter (Signed)
Lmtcb No call back  This encounter will be closed

## 2020-12-21 ENCOUNTER — Other Ambulatory Visit: Payer: Self-pay

## 2020-12-21 ENCOUNTER — Ambulatory Visit (INDEPENDENT_AMBULATORY_CARE_PROVIDER_SITE_OTHER): Payer: Medicare HMO | Admitting: Family Medicine

## 2020-12-21 ENCOUNTER — Encounter: Payer: Self-pay | Admitting: Family Medicine

## 2020-12-21 VITALS — BP 130/78 | HR 67 | Temp 97.3°F | Ht 64.0 in | Wt 174.4 lb

## 2020-12-21 DIAGNOSIS — F411 Generalized anxiety disorder: Secondary | ICD-10-CM

## 2020-12-21 DIAGNOSIS — E1140 Type 2 diabetes mellitus with diabetic neuropathy, unspecified: Secondary | ICD-10-CM | POA: Diagnosis not present

## 2020-12-21 DIAGNOSIS — K219 Gastro-esophageal reflux disease without esophagitis: Secondary | ICD-10-CM

## 2020-12-21 DIAGNOSIS — E1169 Type 2 diabetes mellitus with other specified complication: Secondary | ICD-10-CM | POA: Diagnosis not present

## 2020-12-21 DIAGNOSIS — I10 Essential (primary) hypertension: Secondary | ICD-10-CM

## 2020-12-21 DIAGNOSIS — R569 Unspecified convulsions: Secondary | ICD-10-CM

## 2020-12-21 DIAGNOSIS — F339 Major depressive disorder, recurrent, unspecified: Secondary | ICD-10-CM | POA: Diagnosis not present

## 2020-12-21 DIAGNOSIS — R7303 Prediabetes: Secondary | ICD-10-CM | POA: Diagnosis not present

## 2020-12-21 DIAGNOSIS — Z23 Encounter for immunization: Secondary | ICD-10-CM | POA: Diagnosis not present

## 2020-12-21 DIAGNOSIS — Z79899 Other long term (current) drug therapy: Secondary | ICD-10-CM | POA: Diagnosis not present

## 2020-12-21 DIAGNOSIS — E7841 Elevated Lipoprotein(a): Secondary | ICD-10-CM | POA: Diagnosis not present

## 2020-12-21 DIAGNOSIS — I70211 Atherosclerosis of native arteries of extremities with intermittent claudication, right leg: Secondary | ICD-10-CM

## 2020-12-21 DIAGNOSIS — E785 Hyperlipidemia, unspecified: Secondary | ICD-10-CM | POA: Diagnosis not present

## 2020-12-21 LAB — BAYER DCA HB A1C WAIVED: HB A1C (BAYER DCA - WAIVED): 6.3 % — ABNORMAL HIGH (ref 4.8–5.6)

## 2020-12-21 MED ORDER — LORAZEPAM 0.5 MG PO TABS: 0.5000 mg | ORAL_TABLET | Freq: Two times a day (BID) | ORAL | 5 refills | Status: DC | PRN

## 2020-12-21 MED ORDER — LAMOTRIGINE 200 MG PO TABS: 200.0000 mg | ORAL_TABLET | Freq: Two times a day (BID) | ORAL | 3 refills | Status: DC

## 2020-12-21 NOTE — Patient Instructions (Signed)

## 2020-12-21 NOTE — Progress Notes (Signed)
Established Patient Office Visit  Subjective:  [name], female    DOB: 12-31-1950  Age: 70 y.o. MRN: [id number]  CC:  Chief Complaint  Patient presents with   Medical Management of Chronic Issues    HPI Carol Richmond presents for chronic follow up.   1. HTN Complaint with meds - Yes Current Medications - Coreg and Entresto Checking BP at home: no Exercising Regularly - No Watching Salt intake - No Pertinent ROS:  Headache - No Fatigue - No Visual Disturbances - No Chest pain - No Dyspnea - No Palpitations - No LE edema - No They report good compliance with medications and can restate their regimen by memory. No medication side effects.  She is not taking the statin for her cholesterol. She has been working on her diet instead.   BP Readings from Last 3 Encounters:  12/21/20 130/78  11/20/20 126/68  10/06/20 121/75     2. Anxiety, depression On cymbalta and lorazepam prn.   Depression screen [facility] 2/9 12/21/2020 11/20/2020 09/18/2020  Decreased Interest 0 0 1  Down, Depressed, Hopeless 0 1 2  PHQ - 2 Score 0 1 3  Altered sleeping 0 1 0  Tired, decreased energy 0 1 1  Change in appetite 0 0 0  Feeling bad or failure about yourself  0 0 0  Trouble concentrating 0 0 0  Moving slowly or fidgety/restless 0 0 0  Suicidal thoughts 0 0 0  PHQ-9 Score 0 3 4  Difficult doing work/chores Not difficult at all Not difficult at all Not difficult at all  Some recent data might be hidden   GAD 7 : Generalized Anxiety Score 12/21/2020 11/20/2020 09/18/2020 06/27/2020  Nervous, Anxious, on Edge 0 1 0 1  Control/stop worrying 0 0 1 0  Worry too much - different things 0 0 1 1  Trouble relaxing 0 0 0 0  Restless 0 0 0 0  Easily annoyed or irritable 0 0 0 0  Afraid - awful might happen 0 0 0 0  Total GAD 7 Score 0 _0 Anxiety Difficulty Not difficult at all Not difficult at all Not difficult at all Not difficult at all    3. GERD Compliant with  medications - Yes Current medications - protonix Adverse side effects - No Red Flags (weight loss, hematochezia, melena, weight loss, early satiety, fevers, odynophagia, or persistent vomiting) - No She has a EGD scheduled next week   Past Medical History:  Diagnosis Date   Asthma    Carotid artery disease ([place])    Diverticulosis of colon (without mention of hemorrhage)    Essential hypertension    Functional ovarian cysts    GERD (gastroesophageal reflux disease)    Hemorrhoids, external, without mention of complication    Hormone replacement therapy (postmenopausal)    IBS (irritable bowel syndrome)    Migraine    Mitral valve disease    Right knee DJD    Seizures (HCC)    Sleep apnea    TIA (transient ischemic attack)    Type 2 diabetes mellitus ([place])    Type 2 diabetes mellitus with hyperglycemia, without long-term current use of insulin ([place]) 08/07/2017    Past Surgical History:  Procedure Laterality Date   BIOPSY  09/23/2018   Procedure: BIOPSY;  Surgeon: Rogene Houston, MD;  [facility];  Service: Endoscopy;;  duodeneum and stomach   CHOLECYSTECTOMY  1980   ENDARTERECTOMY Right 07/01/2017  Procedure: ENDARTERECTOMY CAROTID RIGHT;  Surgeon: Serafina Mitchell, MD;  Location: [facility] OR;  Service: Vascular;  Laterality: Right;   ESOPHAGOGASTRODUODENOSCOPY N/A 09/23/2018   Procedure: ESOPHAGOGASTRODUODENOSCOPY (EGD);  Surgeon: Rogene Houston, MD;  [facility];  Service: Endoscopy;  Laterality: N/A;  2:45-moved to 1:45 per Lelon Frohlich   LEFT HEART CATHETERIZATION WITH CORONARY ANGIOGRAM N/A 11/18/2013   Procedure: LEFT HEART CATHETERIZATION WITH CORONARY ANGIOGRAM;  Surgeon: Burnell Blanks, MD;  Location: [facility] CATH LAB;  Service: Cardiovascular;  Laterality: N/A;   OOPHORECTOMY     2012 Dr. Adah Perl in [place] Right 07/01/2017   Procedure: RIGHT CAROTID PATCH ANGIOPLASTY;  Surgeon: Serafina Mitchell, MD;  Location: [place];  Service: Vascular;   Laterality: Right;   TONSILLECTOMY     TOTAL KNEE ARTHROPLASTY Right 09/02/2012   Procedure: TOTAL KNEE ARTHROPLASTY- right;  Surgeon: Ninetta Lights, MD;  Location: [place];  Service: Orthopedics;  Laterality: Right;   TUBAL LIGATION     TYMPANOMASTOIDECTOMY Left 04/02/2016   Procedure: LEFT CANAL WALL DOWN TYMPANOMASTOIDECTOMY;  Surgeon: Leta Baptist, MD;  Location: [place];  Service: ENT;  Laterality: Left;   VAGINAL HYSTERECTOMY  1990   WOUND EXPLORATION Right 07/02/2017   Procedure: RIGHT NECK EXPLORATION WITH INTRAOPERATIVE ULTRASOUND;  Surgeon: Angelia Mould, MD;  Location: [facility] OR;  Service: Vascular;  Laterality: Right;    Family History  Problem Relation Age of Onset   Heart disease Mother    Diabetes Mother    Heart disease Brother    Cancer Sister    Heart disease Brother    Heart disease Brother    Heart disease Son     Social History   Socioeconomic History   Marital status: Widowed    Spouse name: Not on file   Number of children: 3   Years of education: Not on file   Highest education level: Not on file  Occupational History   Not on file  Tobacco Use   Smoking status: Never   Smokeless tobacco: Never  Vaping Use   Vaping Use: Never used  Substance and Sexual Activity   Alcohol use: No    Alcohol/week: 0.0 standard drinks   Drug use: No   Sexual activity: Not on file  Other Topics Concern   Not on file  Social History Narrative   Lives alone. Granddaughter lives across the street, her 3 children all live nearby   Social Determinants of Health   Financial Resource Strain: Low Risk    Difficulty of Paying Living Expenses: Not hard at all  Food Insecurity: No Food Insecurity   Worried About Charity fundraiser in the Last Year: Never true   Arboriculturist in the Last Year: Never true  Transportation Needs: No Transportation Needs   Lack of Transportation (Medical): No   Lack of Transportation (Non-Medical): No  Physical Activity:  Sufficiently Active   Days of Exercise per Week: 7 days   Minutes of Exercise per Session: 30 min  Stress: No Stress Concern Present   Feeling of Stress : Not at all  Social Connections: Moderately Integrated   Frequency of Communication with Friends and Family: More than three times a week   Frequency of Social Gatherings with Friends and Family: More than three times a week   Attends Religious Services: More than 4 times per year   Active Member of [employer] or Organizations: Yes   Attends Archivist Meetings:  More than 4 times per year   Marital Status: Widowed  Human resources officer Violence: Not At Risk   Fear of Current or Ex-Partner: No   Emotionally Abused: No   Physically Abused: No   Sexually Abused: No    Outpatient Medications Prior to Visit  Medication Sig Dispense Refill   acetaminophen (TYLENOL) 500 MG tablet Take 1 tablet (500 mg total) by mouth every 6 (six) hours as needed. 30 tablet 0   albuterol (VENTOLIN HFA) 108 (90 Base) MCG/ACT inhaler Inhale 2 puffs into the lungs every 4 (four) hours as needed for shortness of breath. 18 g 11   aspirin EC 81 MG tablet Take 1 tablet (81 mg total) by mouth daily.     baclofen (LIORESAL) 10 MG tablet Take 1 tablet (10 mg total) by mouth 3 (three) times daily. 30 each 2   carvedilol (COREG) 3.125 MG tablet Take 1 tablet (3.125 mg total) by mouth 2 (two) times daily with a meal. 180 tablet 1   cetirizine (ZYRTEC) 10 MG tablet Take 1 tablet (10 mg total) by mouth daily. 90 tablet 2   cholecalciferol (VITAMIN D) 1000 UNITS tablet Take 1,000 Units by mouth daily.     clotrimazole-betamethasone (LOTRISONE) cream Apply 1 application topically daily. 30 g 0   DULoxetine (CYMBALTA) 60 MG capsule Take 1 capsule (60 mg total) by mouth daily. 30 capsule 5   fluticasone (FLOVENT HFA) 44 MCG/ACT inhaler Inhale 2 puffs into the lungs 2 (two) times daily. 1 each 12   furosemide (LASIX) 40 MG tablet Take 1 tablet (40 mg total) by mouth daily. 90  tablet 1   gabapentin (NEURONTIN) 300 MG capsule Take 300 mg by mouth at bedtime.     lactulose (CHRONULAC) 10 GM/15ML solution TAKE 30 MLS (20 GRAM TOTAL) BY MOUTH 3 TIMES DAILY. 2700 mL 5   lamoTRIgine (LAMICTAL) 200 MG tablet Take 1 tablet (200 mg total) by mouth 2 (two) times daily. 60 tablet 2   loperamide (IMODIUM) 2 MG capsule Take 2 tablets after the first loose stool, then 1 tablet after each loose stool. Max of 8 tablets per day. 30 capsule 0   LORazepam (ATIVAN) 0.5 MG tablet Take 1 tablet (0.5 mg total) by mouth 2 (two) times daily as needed for anxiety. 30 tablet 2   Methylcobalamin (B12-ACTIVE PO) daily.     naloxone (NARCAN) nasal spray 4 mg/0.1 mL      nystatin cream (MYCOSTATIN) Apply 1 application topically 2 (two) times daily. 30 g 0   ondansetron (ZOFRAN) 4 MG tablet Take 1 tablet (4 mg total) by mouth every 8 (eight) hours as needed for nausea or vomiting. 20 tablet 2   Oxycodone HCl 10 MG TABS Take 10 mg by mouth 3 (three) times daily as needed.     pantoprazole (PROTONIX) 40 MG tablet Take 1 tablet (40 mg total) by mouth daily before breakfast. 90 tablet 3   Pediatric Multivitamins-Iron (FLINTSTONES PLUS IRON) chewable tablet Chew 1 tablet by mouth 2 (two) times daily.     potassium chloride SA (K-DUR,KLOR-CON) 20 MEQ tablet Take 20 mEq by mouth daily.      sacubitril-valsartan (ENTRESTO) 24-26 MG Take 1 tablet by mouth 2 (two) times daily. 180 tablet 3   rosuvastatin (CRESTOR) 5 MG tablet Take 1 tablet (5 mg total) by mouth daily. (Patient not taking: Reported on 12/21/2020) 90 tablet 3   Facility-Administered Medications Prior to Visit  Medication Dose Route Frequency Provider Last Rate Last Admin  magic mouthwash  5 mL Oral TID PRN Laurine Blazer B, PA-C        No Known Allergies  ROS Review of Systems Negative unless specially indicated above in HPI.   Objective:    Physical Exam Vitals and nursing note reviewed.  Constitutional:      General: She is not  in acute distress.    Appearance: Normal appearance. She is not ill-appearing, toxic-appearing or diaphoretic.  Eyes:     Conjunctiva/sclera: Conjunctivae normal.     Pupils: Pupils are equal, round, and reactive to light.  Neck:     Vascular: No carotid bruit.  Cardiovascular:     Rate and Rhythm: Normal rate and regular rhythm.     Heart sounds: Normal heart sounds. No murmur heard. Pulmonary:     Effort: Pulmonary effort is normal.     Breath sounds: Normal breath sounds. No wheezing.  Abdominal:     General: Bowel sounds are normal. There is no distension.     Palpations: Abdomen is soft.     Tenderness: There is no abdominal tenderness. There is no guarding or rebound.  Musculoskeletal:     Cervical back: No tenderness.     Right lower leg: No edema.     Left lower leg: No edema.  Skin:    General: Skin is warm and dry.  Neurological:     General: No focal deficit present.     Mental Status: She is alert and oriented to person, place, and time.     Motor: No weakness.     Gait: Gait normal.  Psychiatric:        Mood and Affect: Mood normal.        Behavior: Behavior normal.    BP 130/78   Pulse 67   Temp (!) 97.3 F (36.3 C) (Temporal)   Ht _0  (1.626 m)   Wt 174 lb 6 oz (79.1 kg)   BMI 29.93 kg/m  Wt Readings from Last 3 Encounters:  12/21/20 174 lb 6 oz (79.1 kg)  11/20/20 175 lb 8 oz (79.6 kg)  10/06/20 169 lb 14.4 oz (77.1 kg)     Health Maintenance Due  Topic Date Due   Pneumonia Vaccine 34+ Years old (1 - PCV) Never done    There are no preventive care reminders to display for this patient.  Lab Results  Component Value Date   TSH 0.941 03/29/2020   Lab Results  Component Value Date   WBC 9.1 09/18/2020   HGB 13.7 09/18/2020   HCT 41.6 09/18/2020   MCV 87 09/18/2020   PLT 180 09/18/2020   Lab Results  Component Value Date   NA 143 09/18/2020   K 4.5 09/18/2020   CO2 26 09/18/2020   GLUCOSE 85 09/18/2020   BUN 8 09/18/2020    CREATININE 0.86 09/18/2020   BILITOT 0.4 09/18/2020   ALKPHOS 96 09/18/2020   AST 29 09/18/2020   ALT 15 09/18/2020   PROT 7.1 09/18/2020   ALBUMIN 4.6 09/18/2020   CALCIUM 9.5 09/18/2020   ANIONGAP 12 08/15/2017   EGFR 74 09/18/2020   Lab Results  Component Value Date   CHOL 229 (H) 09/18/2020   Lab Results  Component Value Date   HDL 68 09/18/2020   Lab Results  Component Value Date   LDLCALC 136 (H) 09/18/2020   Lab Results  Component Value Date   TRIG 144 09/18/2020   Lab Results  Component Value Date   CHOLHDL 3.4 09/18/2020  Lab Results  Component Value Date   HGBA1C 6.1 09/18/2020      Assessment & Plan:   Carol Richmond was seen today for medical management of chronic issues.  Diagnoses and all orders for this visit:  Primary hypertension Well controlled on current regimen. Labs pending. -     CBC with Differential/Platelet -     CMP14+EGFR  Elevated lipoprotein(a) Not on statin, has been working on diet. Labs pending.  -     CBC with Differential/Platelet -     CMP14+EGFR -     Lipid panel  Prediabetes A1c 6.3 today. Diet, exercise, weight loss recommended.  -     CBC with Differential/Platelet -     CMP14+EGFR -     Bayer DCA Hb A1c Waived  Gastroesophageal reflux disease without esophagitis Managed by GI. Has EGD scheduled for next week.   Depression, recurrent ([place]) Well controlled on current regimen.   Generalized anxiety disorder Controlled substance agreement signed Well controlled on current regimen. PDMP reviewed, no red flags. CSA and toxassure UTD.  -     LORazepam (ATIVAN) 0.5 MG tablet; Take 1 tablet (0.5 mg total) by mouth 2 (two) times daily as needed for anxiety.  Atherosclerosis of native artery of right lower extremity with intermittent claudication (HCC) On aspirin.   Partial seizure ([place]) Well controlled on current regimen.  -     lamoTRIgine (LAMICTAL) 200 MG tablet; Take 1 tablet (200 mg total) by mouth 2 (two) times  daily.  Need for vaccination -     Pneumococcal conjugate vaccine 20-valent (Prevnar 20)  Follow-up: Return in about 3 months (around 03/23/2021) for chronic follow up and MMSE.   The patient indicates understanding of these issues and agrees with the plan.    Gwenlyn Perking, FNP

## 2020-12-22 LAB — CBC WITH DIFFERENTIAL/PLATELET
Basophils Absolute: 0 10*3/uL (ref 0.0–0.2)
Basos: 1 %
EOS (ABSOLUTE): 0.1 10*3/uL (ref 0.0–0.4)
Eos: 2 %
Hematocrit: 42.1 % (ref 34.0–46.6)
Hemoglobin: 14.1 g/dL (ref 11.1–15.9)
Immature Grans (Abs): 0 10*3/uL (ref 0.0–0.1)
Immature Granulocytes: 0 %
Lymphocytes Absolute: 1.9 10*3/uL (ref 0.7–3.1)
Lymphs: 26 %
MCH: 29.8 pg (ref 26.6–33.0)
MCHC: 33.5 g/dL (ref 31.5–35.7)
MCV: 89 fL (ref 79–97)
Monocytes Absolute: 0.9 10*3/uL (ref 0.1–0.9)
Monocytes: 12 %
Neutrophils Absolute: 4.5 10*3/uL (ref 1.4–7.0)
Neutrophils: 59 %
Platelets: 186 10*3/uL (ref 150–450)
RBC: 4.73 x10E6/uL (ref 3.77–5.28)
RDW: 13.1 % (ref 11.7–15.4)
WBC: 7.5 10*3/uL (ref 3.4–10.8)

## 2020-12-22 LAB — LIPID PANEL
Chol/HDL Ratio: 3.5 ratio (ref 0.0–4.4)
Cholesterol, Total: 230 mg/dL — ABNORMAL HIGH (ref 100–199)
HDL: 65 mg/dL (ref >39)
LDL Chol Calc (NIH): 141 mg/dL — ABNORMAL HIGH (ref 0–99)
Triglycerides: 136 mg/dL (ref 0–149)
VLDL Cholesterol Cal: 24 mg/dL (ref 5–40)

## 2020-12-22 LAB — CMP14+EGFR
ALT: 19 IU/L (ref 0–32)
AST: 26 IU/L (ref 0–40)
Albumin/Globulin Ratio: 1.9 (ref 1.2–2.2)
Albumin: 4.8 g/dL (ref 3.8–4.8)
Alkaline Phosphatase: 112 IU/L (ref 44–121)
BUN/Creatinine Ratio: 10 — ABNORMAL LOW (ref 12–28)
BUN: 9 mg/dL (ref 8–27)
Bilirubin Total: 0.4 mg/dL (ref 0.0–1.2)
CO2: 26 mmol/L (ref 20–29)
Calcium: 9.3 mg/dL (ref 8.7–10.3)
Chloride: 103 mmol/L (ref 96–106)
Creatinine, Ser: 0.89 mg/dL (ref 0.57–1.00)
Globulin, Total: 2.5 g/dL (ref 1.5–4.5)
Glucose: 164 mg/dL — ABNORMAL HIGH (ref 70–99)
Potassium: 4.3 mmol/L (ref 3.5–5.2)
Sodium: 144 mmol/L (ref 134–144)
Total Protein: 7.3 g/dL (ref 6.0–8.5)
eGFR: 71 mL/min/{1.73_m2} (ref >59)

## 2020-12-25 ENCOUNTER — Encounter: Payer: Self-pay | Admitting: Nurse Practitioner

## 2020-12-25 ENCOUNTER — Ambulatory Visit (INDEPENDENT_AMBULATORY_CARE_PROVIDER_SITE_OTHER): Payer: Medicare HMO | Admitting: Nurse Practitioner

## 2020-12-25 DIAGNOSIS — J069 Acute upper respiratory infection, unspecified: Secondary | ICD-10-CM | POA: Insufficient documentation

## 2020-12-25 MED ORDER — GUAIFENESIN ER 600 MG PO TB12: 600.0000 mg | ORAL_TABLET | Freq: Two times a day (BID) | ORAL | 0 refills | Status: DC

## 2020-12-25 MED ORDER — BENZONATATE 100 MG PO CAPS: 100.0000 mg | ORAL_CAPSULE | Freq: Three times a day (TID) | ORAL | 0 refills | Status: DC | PRN

## 2020-12-25 NOTE — Patient Instructions (Signed)

## 2020-12-25 NOTE — Assessment & Plan Note (Signed)
Take meds as prescribed - Use a cool mist humidifier  -Use saline nose sprays frequently -Force fluids -For fever or aches or pains- take Tylenol or ibuprofen. -Benzonatate for cough -Mucinex for congestion -If symptoms do not improve, she may need to be COVID tested to rule this out Follow up with worsening unresolved symptoms

## 2020-12-25 NOTE — Progress Notes (Signed)
   Virtual Visit  Note Due to COVID-19 pandemic this visit was conducted virtually. This visit type was conducted due to national recommendations for restrictions regarding the COVID-19 Pandemic (e.g. social distancing, sheltering in place) in an effort to limit this patient's exposure and mitigate transmission in our community. All issues noted in this document were discussed and addressed.  A physical exam was not performed with this format.  I connected with Carol Richmond on 12/25/20 at 3 PM by telephone and verified that I am speaking with the correct person using two identifiers. Carol Richmond is currently located at home during visit. The provider, Ivy Lynn, NP is located in their office at time of visit.  I discussed the limitations, risks, security and privacy concerns of performing an evaluation and management service by telephone and the availability of in person appointments. I also discussed with the patient that there may be a patient responsible charge related to this service. The patient expressed understanding and agreed to proceed.   History and Present Illness:  URI  This is a new problem. The current episode started yesterday. The problem has been gradually worsening. There has been no fever. Associated symptoms include congestion, coughing and sneezing. Pertinent negatives include no abdominal pain, ear pain, headaches or rash. She has tried nothing for the symptoms.     Review of Systems  HENT:  Positive for congestion and sneezing. Negative for ear pain.   Respiratory:  Positive for cough.   Gastrointestinal:  Negative for abdominal pain.  Skin:  Negative for rash.  Neurological:  Negative for headaches.  All other systems reviewed and are negative.   Observations/Objective: Televisit patient not in distress.  Assessment and Plan: Patient is not willing to do any flu swab or COVID swab  because she had her flu vaccine a week ago and does not  believe she has COVID.  Take meds as prescribed - Use a cool mist humidifier  -Use saline nose sprays frequently -Force fluids -For fever or aches or pains- take Tylenol or ibuprofen. -Benzonatate for cough -Mucinex for congestion -If symptoms do not improve, she may need to be COVID tested to rule this out Follow up with worsening unresolved symptoms   Follow Up Instructions: Follow-up with worsening unresolved symptoms.    I discussed the assessment and treatment plan with the patient. The patient was provided an opportunity to ask questions and all were answered. The patient agreed with the plan and demonstrated an understanding of the instructions.   The patient was advised to call back or seek an in-person evaluation if the symptoms worsen or if the condition fails to improve as anticipated.  The above assessment and management plan was discussed with the patient. The patient verbalized understanding of and has agreed to the management plan. Patient is aware to call the clinic if symptoms persist or worsen. Patient is aware when to return to the clinic for a follow-up visit. Patient educated on when it is appropriate to go to the emergency department.   Time call ended: 3:08 PM  I provided 8 minutes of  non face-to-face time during this encounter.    Ivy Lynn, NP

## 2020-12-26 ENCOUNTER — Telehealth (INDEPENDENT_AMBULATORY_CARE_PROVIDER_SITE_OTHER): Payer: Self-pay | Admitting: *Deleted

## 2020-12-26 ENCOUNTER — Encounter: Payer: Self-pay | Admitting: *Deleted

## 2020-12-26 NOTE — Telephone Encounter (Signed)
Pt called and states she is unable to do bw today for EGD that is scheduled on 11/10. States she has some sinus symptoms and needs to reschedule EGD. Pt aware it will be rescheduled and she will get a call back.

## 2020-12-27 ENCOUNTER — Ambulatory Visit (HOSPITAL_COMMUNITY): Payer: Medicare HMO

## 2020-12-27 ENCOUNTER — Other Ambulatory Visit: Payer: Medicare HMO

## 2020-12-27 NOTE — Telephone Encounter (Signed)
error 

## 2020-12-28 ENCOUNTER — Telehealth: Payer: Self-pay | Admitting: Family Medicine

## 2020-12-28 ENCOUNTER — Encounter (HOSPITAL_COMMUNITY): Admission: RE | Payer: Self-pay | Source: Home / Self Care

## 2020-12-28 ENCOUNTER — Ambulatory Visit (HOSPITAL_COMMUNITY): Admission: RE | Admit: 2020-12-28 | Payer: Medicare HMO | Source: Home / Self Care | Admitting: Internal Medicine

## 2020-12-28 SURGERY — EGD (ESOPHAGOGASTRODUODENOSCOPY)
Anesthesia: Moderate Sedation

## 2020-12-28 NOTE — Telephone Encounter (Signed)
lmtcb

## 2020-12-28 NOTE — Telephone Encounter (Signed)
If it does no have tylenol in it, she can take it. Some of the versions of alka seltzer plus do and some do not. If she isn't sure, I would recommend mucinex.

## 2020-12-29 ENCOUNTER — Ambulatory Visit (INDEPENDENT_AMBULATORY_CARE_PROVIDER_SITE_OTHER): Payer: Medicare HMO | Admitting: Family Medicine

## 2020-12-29 ENCOUNTER — Encounter: Payer: Self-pay | Admitting: Family Medicine

## 2020-12-29 DIAGNOSIS — J988 Other specified respiratory disorders: Secondary | ICD-10-CM | POA: Diagnosis not present

## 2020-12-29 MED ORDER — AZITHROMYCIN 250 MG PO TABS: ORAL_TABLET | ORAL | 0 refills | Status: DC

## 2020-12-29 MED ORDER — DEXTROMETHORPHAN POLISTIREX ER 30 MG/5ML PO SUER: 60.0000 mg | Freq: Two times a day (BID) | ORAL | 0 refills | Status: DC

## 2020-12-29 NOTE — Progress Notes (Signed)
   Virtual Visit  Note Due to COVID-19 pandemic this visit was conducted virtually. This visit type was conducted due to national recommendations for restrictions regarding the COVID-19 Pandemic (e.g. social distancing, sheltering in place) in an effort to limit this patient's exposure and mitigate transmission in our community. All issues noted in this document were discussed and addressed.  A physical exam was not performed with this format.  I connected with Carol Richmond on 12/29/20 at 1153 by telephone and verified that I am speaking with the correct person using two identifiers. Carol Richmond is currently located at home and no one is currently with her during the visit. The provider, Gwenlyn Perking, FNP is located in their office at time of visit.  I discussed the limitations, risks, security and privacy concerns of performing an evaluation and management service by telephone and the availability of in person appointments. I also discussed with the patient that there may be a patient responsible charge related to this service. The patient expressed understanding and agreed to proceed.  CC: sinusitis  History and Present Illness:  HPI Carol Richmond reports HA, cough, and congestion for 7 days. She denies fever, chills, shortness of breath, chest pain, vomiting, or diarrhea. She has been taking mucinex and tessalon perles without improvement. She has had a negative Covid test. She reports that her symptoms have not improved.   ROS As per HPI.   Observations/Objective: Alert and oriented x 3. Able to speak in full sentences without difficulty.    Assessment and Plan: Carol Richmond was seen today for sinusitis.  Diagnoses and all orders for this visit:  Respiratory infection Zpak as below. Continue mucinex. Delsym ordered as well.  -     azithromycin (ZITHROMAX Z-PAK) 250 MG tablet; As directed -     dextromethorphan (DELSYM) 30 MG/5ML liquid; Take 10 mLs (60 mg total) by mouth 2  (two) times daily.   Follow Up Instructions: As needed.     I discussed the assessment and treatment plan with the patient. The patient was provided an opportunity to ask questions and all were answered. The patient agreed with the plan and demonstrated an understanding of the instructions.   The patient was advised to call back or seek an in-person evaluation if the symptoms worsen or if the condition fails to improve as anticipated.  The above assessment and management plan was discussed with the patient. The patient verbalized understanding of and has agreed to the management plan. Patient is aware to call the clinic if symptoms persist or worsen. Patient is aware when to return to the clinic for a follow-up visit. Patient educated on when it is appropriate to go to the emergency department.   Time call ended: 1205   I provided 12 minutes of  non face-to-face time during this encounter.    Gwenlyn Perking, FNP

## 2021-01-03 ENCOUNTER — Encounter: Payer: Self-pay | Admitting: Family Medicine

## 2021-01-03 ENCOUNTER — Ambulatory Visit (INDEPENDENT_AMBULATORY_CARE_PROVIDER_SITE_OTHER): Payer: Medicare HMO | Admitting: Family Medicine

## 2021-01-03 ENCOUNTER — Other Ambulatory Visit: Payer: Self-pay

## 2021-01-03 VITALS — BP 125/66 | HR 87 | Temp 98.1°F | Ht 64.0 in | Wt 174.0 lb

## 2021-01-03 DIAGNOSIS — H60502 Unspecified acute noninfective otitis externa, left ear: Secondary | ICD-10-CM | POA: Diagnosis not present

## 2021-01-03 DIAGNOSIS — H6121 Impacted cerumen, right ear: Secondary | ICD-10-CM

## 2021-01-03 MED ORDER — OFLOXACIN 0.3 % OT SOLN: 10.0000 [drp] | Freq: Every day | OTIC | 0 refills | Status: AC

## 2021-01-03 NOTE — Progress Notes (Signed)
Acute Office Visit  Subjective:    [name], female    DOB: [birth year], 70 y.o.   MRN: [id number]  Chief Complaint  Patient presents with   Ear Pain    HPI Patient is in today for bilateral ear pain x for 3-4 days. She reprots a dull ache in her right ear and a sharp pain in her left ear. The left ear hurts to touch. She denies drainage or fever.  She is recovering from a sinus infection and recently completed a zpak.   Past Medical History:  Diagnosis Date   Asthma    Carotid artery disease ([place])    Diverticulosis of colon (without mention of hemorrhage)    Essential hypertension    Functional ovarian cysts    GERD (gastroesophageal reflux disease)    Hemorrhoids, external, without mention of complication    Hormone replacement therapy (postmenopausal)    IBS (irritable bowel syndrome)    Migraine    Mitral valve disease    Right knee DJD    Seizures (HCC)    Sleep apnea    TIA (transient ischemic attack)    Type 2 diabetes mellitus ([place])    Type 2 diabetes mellitus with hyperglycemia, without long-term current use of insulin ([place]) 08/07/2017    Past Surgical History:  Procedure Laterality Date   BIOPSY  09/23/2018   Procedure: BIOPSY;  Surgeon: Rogene Houston, MD;  [facility];  Service: Endoscopy;;  duodeneum and stomach   CHOLECYSTECTOMY  1980   ENDARTERECTOMY Right 07/01/2017   Procedure: ENDARTERECTOMY CAROTID RIGHT;  Surgeon: Serafina Mitchell, MD;  [facility]: [facility];  Service: Vascular;  Laterality: Right;   ESOPHAGOGASTRODUODENOSCOPY N/A 09/23/2018   Procedure: ESOPHAGOGASTRODUODENOSCOPY (EGD);  Surgeon: Rogene Houston, MD;  [facility];  Service: Endoscopy;  Laterality: N/A;  2:45-moved to 1:45 per Lelon Frohlich   LEFT HEART CATHETERIZATION WITH CORONARY ANGIOGRAM N/A 11/18/2013   Procedure: LEFT HEART CATHETERIZATION WITH CORONARY ANGIOGRAM;  Surgeon: Burnell Blanks, MD;  Location: [facility] CATH LAB;  Service: Cardiovascular;   Laterality: N/A;   OOPHORECTOMY     2012 Dr. Adah Perl in [place] Right 07/01/2017   Procedure: RIGHT CAROTID PATCH ANGIOPLASTY;  Surgeon: Serafina Mitchell, MD;  Location: [place];  Service: Vascular;  Laterality: Right;   TONSILLECTOMY     TOTAL KNEE ARTHROPLASTY Right 09/02/2012   Procedure: TOTAL KNEE ARTHROPLASTY- right;  Surgeon: Ninetta Lights, MD;  Location: [place];  Service: Orthopedics;  Laterality: Right;   TUBAL LIGATION     TYMPANOMASTOIDECTOMY Left 04/02/2016   Procedure: LEFT CANAL WALL DOWN TYMPANOMASTOIDECTOMY;  Surgeon: Leta Baptist, MD;  Location: [place];  Service: ENT;  Laterality: Left;   VAGINAL HYSTERECTOMY  1990   WOUND EXPLORATION Right 07/02/2017   Procedure: RIGHT NECK EXPLORATION WITH INTRAOPERATIVE ULTRASOUND;  Surgeon: Angelia Mould, MD;  Location: [facility] OR;  Service: Vascular;  Laterality: Right;    Family History  Problem Relation Age of Onset   Heart disease Mother    Diabetes Mother    Heart disease Brother    Cancer Sister    Heart disease Brother    Heart disease Brother    Heart disease Son     Social History   Socioeconomic History   Marital status: Widowed    Spouse name: Not on file   Number of children: 3   Years of education: Not on file   Highest education  level: Not on file  Occupational History   Not on file  Tobacco Use   Smoking status: Never   Smokeless tobacco: Never  Vaping Use   Vaping Use: Never used  Substance and Sexual Activity   Alcohol use: No    Alcohol/week: 0.0 standard drinks   Drug use: No   Sexual activity: Not on file  Other Topics Concern   Not on file  Social History Narrative   Lives alone. Granddaughter lives across the street, her 3 children all live nearby   Social Determinants of Health   Financial Resource Strain: Low Risk    Difficulty of Paying Living Expenses: Not hard at all  Food Insecurity: No Food Insecurity   Worried About Charity fundraiser in the  Last Year: Never true   Arboriculturist in the Last Year: Never true  Transportation Needs: No Transportation Needs   Lack of Transportation (Medical): No   Lack of Transportation (Non-Medical): No  Physical Activity: Sufficiently Active   Days of Exercise per Week: 7 days   Minutes of Exercise per Session: 30 min  Stress: No Stress Concern Present   Feeling of Stress : Not at all  Social Connections: Moderately Integrated   Frequency of Communication with Friends and Family: More than three times a week   Frequency of Social Gatherings with Friends and Family: More than three times a week   Attends Religious Services: More than 4 times per year   Active Member of [employer] or Organizations: Yes   Attends Archivist Meetings: More than 4 times per year   Marital Status: Widowed  Human resources officer Violence: Not At Risk   Fear of Current or Ex-Partner: No   Emotionally Abused: No   Physically Abused: No   Sexually Abused: No    Outpatient Medications Prior to Visit  Medication Sig Dispense Refill   acetaminophen (TYLENOL) 500 MG tablet Take 500 mg by mouth every 6 (six) hours as needed for mild pain.     albuterol (VENTOLIN HFA) 108 (90 Base) MCG/ACT inhaler Inhale 2 puffs into the lungs every 4 (four) hours as needed for shortness of breath. 18 g 11   aspirin EC 81 MG tablet Take 1 tablet (81 mg total) by mouth daily.     baclofen (LIORESAL) 10 MG tablet Take 1 tablet (10 mg total) by mouth 3 (three) times daily. 30 each 2   benzonatate (TESSALON PERLES) 100 MG capsule Take 1 capsule (100 mg total) by mouth 3 (three) times daily as needed for cough. 20 capsule 0   carvedilol (COREG) 3.125 MG tablet Take 1 tablet (3.125 mg total) by mouth 2 (two) times daily with a meal. 180 tablet 1   cholecalciferol (VITAMIN D) 1000 UNITS tablet Take 1,000 Units by mouth daily.     clotrimazole-betamethasone (LOTRISONE) cream Apply 1 application topically daily. (Patient taking differently:  Apply 1 application topically daily as needed (rash).) 30 g 0   Cyanocobalamin 2500 MCG CHEW Chew 2,500 mcg by mouth daily.     dextromethorphan (DELSYM) 30 MG/5ML liquid Take 10 mLs (60 mg total) by mouth 2 (two) times daily. 89 mL 0   DULoxetine (CYMBALTA) 60 MG capsule Take 1 capsule (60 mg total) by mouth daily. 30 capsule 5   fluticasone (FLOVENT HFA) 44 MCG/ACT inhaler Inhale 2 puffs into the lungs 2 (two) times daily. (Patient taking differently: Inhale 2 puffs into the lungs daily as needed (allergies).) 1 each 12  furosemide (LASIX) 40 MG tablet Take 1 tablet (40 mg total) by mouth daily. 90 tablet 1   gabapentin (NEURONTIN) 300 MG capsule Take 300 mg by mouth 3 (three) times daily.     guaiFENesin (MUCINEX) 600 MG 12 hr tablet Take 1 tablet (600 mg total) by mouth 2 (two) times daily. 30 tablet 0   lactulose (CHRONULAC) 10 GM/15ML solution TAKE 30 MLS (20 GRAM TOTAL) BY MOUTH 3 TIMES DAILY. 2700 mL 5   lamoTRIgine (LAMICTAL) 200 MG tablet Take 1 tablet (200 mg total) by mouth 2 (two) times daily. 180 tablet 3   loperamide (IMODIUM) 2 MG capsule Take 2 tablets after the first loose stool, then 1 tablet after each loose stool. Max of 8 tablets per day. 30 capsule 0   LORazepam (ATIVAN) 0.5 MG tablet Take 1 tablet (0.5 mg total) by mouth 2 (two) times daily as needed for anxiety. 30 tablet 5   melatonin 1 MG TABS tablet Take 1 mg by mouth at bedtime.     naloxone (NARCAN) nasal spray 4 mg/0.1 mL Place 1 spray into the nose as needed.     nystatin cream (MYCOSTATIN) Apply 1 application topically 2 (two) times daily. (Patient taking differently: Apply 1 application topically 2 (two) times daily as needed (rash).) 30 g 0   ondansetron (ZOFRAN) 4 MG tablet Take 1 tablet (4 mg total) by mouth every 8 (eight) hours as needed for nausea or vomiting. 20 tablet 2   Oxycodone HCl 10 MG TABS Take 5-10 mg by mouth 3 (three) times daily as needed (severe pain).     pantoprazole (PROTONIX) 40 MG tablet  Take 1 tablet (40 mg total) by mouth daily before breakfast. 90 tablet 3   potassium chloride SA (K-DUR,KLOR-CON) 20 MEQ tablet Take 20 mEq by mouth daily.      rosuvastatin (CRESTOR) 5 MG tablet Take 1 tablet (5 mg total) by mouth daily. 90 tablet 3   sacubitril-valsartan (ENTRESTO) 24-26 MG Take 1 tablet by mouth 2 (two) times daily. 180 tablet 3   azithromycin (ZITHROMAX Z-PAK) 250 MG tablet As directed 6 tablet 0   No facility-administered medications prior to visit.    No Known Allergies  Review of Systems As per HPI.     Objective:    Physical Exam Vitals and nursing note reviewed.  Constitutional:      General: She is not in acute distress.    Appearance: She is not ill-appearing, toxic-appearing or diaphoretic.  HENT:     Head: Normocephalic and atraumatic.     Right Ear: Ear canal and external ear normal. No drainage, swelling or tenderness. There is impacted cerumen.     Left Ear: Tympanic membrane normal. Swelling (canal) and tenderness (canal and external ear) present. No drainage.  Musculoskeletal:     Right lower leg: No edema.     Left lower leg: No edema.  Skin:    General: Skin is warm and dry.  Neurological:     Mental Status: She is alert and oriented to person, place, and time. Mental status is at baseline.  Psychiatric:        Mood and Affect: Mood normal.        Behavior: Behavior normal.    BP 125/66   Pulse 87   Temp 98.1 F (36.7 C) (Temporal)   Ht 5' 4"  (1.626 m)   Wt 174 lb (78.9 kg)   SpO2 97%   BMI 29.87 kg/m  Wt Readings from Last 3  Encounters:  01/03/21 174 lb (78.9 kg)  12/21/20 174 lb 6 oz (79.1 kg)  11/20/20 175 lb 8 oz (79.6 kg)    There are no preventive care reminders to display for this patient.  There are no preventive care reminders to display for this patient.   Lab Results  Component Value Date   TSH 0.941 03/29/2020   Lab Results  Component Value Date   WBC 7.5 12/21/2020   HGB 14.1 12/21/2020   HCT 42.1  12/21/2020   MCV 89 12/21/2020   PLT 186 12/21/2020   Lab Results  Component Value Date   NA 144 12/21/2020   K 4.3 12/21/2020   CO2 26 12/21/2020   GLUCOSE 164 (H) 12/21/2020   BUN 9 12/21/2020   CREATININE 0.89 12/21/2020   BILITOT 0.4 12/21/2020   ALKPHOS 112 12/21/2020   AST 26 12/21/2020   ALT 19 12/21/2020   PROT 7.3 12/21/2020   ALBUMIN 4.8 12/21/2020   CALCIUM 9.3 12/21/2020   ANIONGAP 12 08/15/2017   EGFR 71 12/21/2020   Lab Results  Component Value Date   CHOL 230 (H) 12/21/2020   Lab Results  Component Value Date   HDL 65 12/21/2020   Lab Results  Component Value Date   LDLCALC 141 (H) 12/21/2020   Lab Results  Component Value Date   TRIG 136 12/21/2020   Lab Results  Component Value Date   CHOLHDL 3.5 12/21/2020   Lab Results  Component Value Date   HGBA1C 6.3 (H) 12/21/2020       Assessment & Plan:   Gearl was seen today for ear pain.  Diagnoses and all orders for this visit:  Acute otitis externa of left ear, unspecified type Floxin drops as below.  -     ofloxacin (FLOXIN OTIC) 0.3 % OTIC solution; Place 10 drops into the left ear daily for 7 days.  Excessive cerumen in right ear canal Debrox OTC drops.   Return to office for new or worsening symptoms, or if symptoms persist.   The patient indicates understanding of these issues and agrees with the plan.  Gwenlyn Perking, FNP

## 2021-01-03 NOTE — Patient Instructions (Signed)

## 2021-01-08 DIAGNOSIS — H95122 Granulation of postmastoidectomy cavity, left ear: Secondary | ICD-10-CM | POA: Diagnosis not present

## 2021-01-10 NOTE — Telephone Encounter (Signed)
No return call 

## 2021-01-17 ENCOUNTER — Encounter: Payer: Self-pay | Admitting: Family Medicine

## 2021-01-17 ENCOUNTER — Telehealth (INDEPENDENT_AMBULATORY_CARE_PROVIDER_SITE_OTHER): Payer: Self-pay | Admitting: *Deleted

## 2021-01-17 ENCOUNTER — Ambulatory Visit (INDEPENDENT_AMBULATORY_CARE_PROVIDER_SITE_OTHER): Payer: Medicare HMO | Admitting: Family Medicine

## 2021-01-17 VITALS — BP 113/66 | HR 67 | Temp 96.5°F | Ht 64.0 in | Wt 176.0 lb

## 2021-01-17 DIAGNOSIS — K7460 Unspecified cirrhosis of liver: Secondary | ICD-10-CM | POA: Diagnosis not present

## 2021-01-17 DIAGNOSIS — R051 Acute cough: Secondary | ICD-10-CM

## 2021-01-17 DIAGNOSIS — I10 Essential (primary) hypertension: Secondary | ICD-10-CM | POA: Diagnosis not present

## 2021-01-17 MED ORDER — BENZONATATE 100 MG PO CAPS: 100.0000 mg | ORAL_CAPSULE | Freq: Three times a day (TID) | ORAL | 0 refills | Status: DC | PRN

## 2021-01-17 NOTE — Progress Notes (Signed)
Acute Office Visit  Subjective:    [name], female    DOB: February 13, 1951, 70 y.o.   MRN: [id number]  Chief Complaint  Patient presents with   Immunizations     HPI Patient is in today for Hep B and A vaccinations. These were recommended by her GI due to her history of cirrhosis. She reports that she had one of the vaccines done at [facility] recently.  She isn't when this was and she does not have a records of this today. She also reports that she called  the health department and was told that she would not be able to get the vaccines done there.   She also reports a cough for the last few days. She denies other symptoms. She has taken tessalon perles with improvement but is now out.   Denies chest pain, shortness of breath, edema, vomiting, or abdominal pain.   Past Medical History:  Diagnosis Date   Asthma    Carotid artery disease ([place])    Diverticulosis of colon (without mention of hemorrhage)    Essential hypertension    Functional ovarian cysts    GERD (gastroesophageal reflux disease)    Hemorrhoids, external, without mention of complication    Hormone replacement therapy (postmenopausal)    IBS (irritable bowel syndrome)    Migraine    Mitral valve disease    Right knee DJD    Seizures (HCC)    Sleep apnea    TIA (transient ischemic attack)    Type 2 diabetes mellitus ([place])    Type 2 diabetes mellitus with hyperglycemia, without long-term current use of insulin ([place]) 08/07/2017    Past Surgical History:  Procedure Laterality Date   BIOPSY  09/23/2018   Procedure: BIOPSY;  Surgeon: Rogene Houston, MD;  [facility];  Service: Endoscopy;;  duodeneum and stomach   CHOLECYSTECTOMY  1980   ENDARTERECTOMY Right 07/01/2017   Procedure: ENDARTERECTOMY CAROTID RIGHT;  Surgeon: Serafina Mitchell, MD;  [facility];  Service: Vascular;  Laterality: Right;   ESOPHAGOGASTRODUODENOSCOPY N/A 09/23/2018   Procedure: ESOPHAGOGASTRODUODENOSCOPY  (EGD);  Surgeon: Rogene Houston, MD;  [facility];  Service: Endoscopy;  Laterality: N/A;  2:45-moved to 1:45 per Lelon Frohlich   LEFT HEART CATHETERIZATION WITH CORONARY ANGIOGRAM N/A 11/18/2013   Procedure: LEFT HEART CATHETERIZATION WITH CORONARY ANGIOGRAM;  Surgeon: Burnell Blanks, MD;  Location: [facility] CATH LAB;  Service: Cardiovascular;  Laterality: N/A;   OOPHORECTOMY     2012 Dr. Adah Perl in [place] Right 07/01/2017   Procedure: RIGHT CAROTID PATCH ANGIOPLASTY;  Surgeon: Serafina Mitchell, MD;  Location: [place];  Service: Vascular;  Laterality: Right;   TONSILLECTOMY     TOTAL KNEE ARTHROPLASTY Right 09/02/2012   Procedure: TOTAL KNEE ARTHROPLASTY- right;  Surgeon: Ninetta Lights, MD;  Location: [place];  Service: Orthopedics;  Laterality: Right;   TUBAL LIGATION     TYMPANOMASTOIDECTOMY Left 04/02/2016   Procedure: LEFT CANAL WALL DOWN TYMPANOMASTOIDECTOMY;  Surgeon: Leta Baptist, MD;  Location: [place];  Service: ENT;  Laterality: Left;   [place] Right 07/02/2017   Procedure: RIGHT NECK EXPLORATION WITH INTRAOPERATIVE ULTRASOUND;  Surgeon: Angelia Mould, MD;  Location: [facility] OR;  Service: Vascular;  Laterality: Right;    Family History  Problem Relation Age of Onset   Heart disease Mother    Diabetes Mother    Heart disease Brother  Cancer Sister    Heart disease Brother    Heart disease Brother    Heart disease Son     Social History   Socioeconomic History   Marital status: Widowed    Spouse name: Not on file   Number of children: 3   Years of education: Not on file   Highest education level: Not on file  Occupational History   Not on file  Tobacco Use   Smoking status: Never   Smokeless tobacco: Never  Vaping Use   Vaping Use: Never used  Substance and Sexual Activity   Alcohol use: No    Alcohol/week: 0.0 standard drinks   Drug use: No   Sexual activity: Not on file  Other  Topics Concern   Not on file  Social History Narrative   Lives alone. Granddaughter lives across the street, her 3 children all live nearby   Social Determinants of Health   Financial Resource Strain: Low Risk    Difficulty of Paying Living Expenses: Not hard at all  Food Insecurity: No Food Insecurity   Worried About Charity fundraiser in the Last Year: Never true   Arboriculturist in the Last Year: Never true  Transportation Needs: No Transportation Needs   Lack of Transportation (Medical): No   Lack of Transportation (Non-Medical): No  Physical Activity: Sufficiently Active   Days of Exercise per Week: 7 days   Minutes of Exercise per Session: 30 min  Stress: No Stress Concern Present   Feeling of Stress : Not at all  Social Connections: Moderately Integrated   Frequency of Communication with Friends and Family: More than three times a week   Frequency of Social Gatherings with Friends and Family: More than three times a week   Attends Religious Services: More than 4 times per year   Active Member of [employer] or Organizations: Yes   Attends Archivist Meetings: More than 4 times per year   Marital Status: Widowed  Human resources officer Violence: Not At Risk   Fear of Current or Ex-Partner: No   Emotionally Abused: No   Physically Abused: No   Sexually Abused: No    Outpatient Medications Prior to Visit  Medication Sig Dispense Refill   acetaminophen (TYLENOL) 500 MG tablet Take 500 mg by mouth every 6 (six) hours as needed for mild pain.     albuterol (VENTOLIN HFA) 108 (90 Base) MCG/ACT inhaler Inhale 2 puffs into the lungs every 4 (four) hours as needed for shortness of breath. 18 g 11   aspirin EC 81 MG tablet Take 1 tablet (81 mg total) by mouth daily.     baclofen (LIORESAL) 10 MG tablet Take 1 tablet (10 mg total) by mouth 3 (three) times daily. 30 each 2   carvedilol (COREG) 3.125 MG tablet Take 1 tablet (3.125 mg total) by mouth 2 (two) times daily with a meal.  180 tablet 1   cholecalciferol (VITAMIN D) 1000 UNITS tablet Take 1,000 Units by mouth daily.     clotrimazole-betamethasone (LOTRISONE) cream Apply 1 application topically daily. (Patient taking differently: Apply 1 application topically daily as needed (rash).) 30 g 0   Cyanocobalamin 2500 MCG CHEW Chew 2,500 mcg by mouth daily.     DULoxetine (CYMBALTA) 60 MG capsule Take 1 capsule (60 mg total) by mouth daily. 30 capsule 5   fluticasone (FLOVENT HFA) 44 MCG/ACT inhaler Inhale 2 puffs into the lungs 2 (two) times daily. (Patient taking differently: Inhale 2 puffs  into the lungs daily as needed (allergies).) 1 each 12   furosemide (LASIX) 40 MG tablet Take 1 tablet (40 mg total) by mouth daily. 90 tablet 1   gabapentin (NEURONTIN) 300 MG capsule Take 300 mg by mouth 3 (three) times daily.     guaiFENesin (MUCINEX) 600 MG 12 hr tablet Take 1 tablet (600 mg total) by mouth 2 (two) times daily. 30 tablet 0   lactulose (CHRONULAC) 10 GM/15ML solution TAKE 30 MLS (20 GRAM TOTAL) BY MOUTH 3 TIMES DAILY. 2700 mL 5   lamoTRIgine (LAMICTAL) 200 MG tablet Take 1 tablet (200 mg total) by mouth 2 (two) times daily. 180 tablet 3   loperamide (IMODIUM) 2 MG capsule Take 2 tablets after the first loose stool, then 1 tablet after each loose stool. Max of 8 tablets per day. 30 capsule 0   LORazepam (ATIVAN) 0.5 MG tablet Take 1 tablet (0.5 mg total) by mouth 2 (two) times daily as needed for anxiety. 30 tablet 5   melatonin 1 MG TABS tablet Take 1 mg by mouth at bedtime.     naloxone (NARCAN) nasal spray 4 mg/0.1 mL Place 1 spray into the nose as needed.     nystatin cream (MYCOSTATIN) Apply 1 application topically 2 (two) times daily. (Patient taking differently: Apply 1 application topically 2 (two) times daily as needed (rash).) 30 g 0   ondansetron (ZOFRAN) 4 MG tablet Take 1 tablet (4 mg total) by mouth every 8 (eight) hours as needed for nausea or vomiting. 20 tablet 2   Oxycodone HCl 10 MG TABS Take 5-10 mg  by mouth 3 (three) times daily as needed (severe pain).     pantoprazole (PROTONIX) 40 MG tablet Take 1 tablet (40 mg total) by mouth daily before breakfast. 90 tablet 3   potassium chloride SA (K-DUR,KLOR-CON) 20 MEQ tablet Take 20 mEq by mouth daily.      rosuvastatin (CRESTOR) 5 MG tablet Take 1 tablet (5 mg total) by mouth daily. 90 tablet 3   sacubitril-valsartan (ENTRESTO) 24-26 MG Take 1 tablet by mouth 2 (two) times daily. 180 tablet 3   benzonatate (TESSALON PERLES) 100 MG capsule Take 1 capsule (100 mg total) by mouth 3 (three) times daily as needed for cough. 20 capsule 0   dextromethorphan (DELSYM) 30 MG/5ML liquid Take 10 mLs (60 mg total) by mouth 2 (two) times daily. 89 mL 0   No facility-administered medications prior to visit.    No Known Allergies  Review of Systems As per HPI.     Objective:    Physical Exam Vitals and nursing note reviewed.  Constitutional:      General: She is not in acute distress.    Appearance: She is not ill-appearing, toxic-appearing or diaphoretic.  Cardiovascular:     Rate and Rhythm: Normal rate and regular rhythm.     Heart sounds: Normal heart sounds. No murmur heard. Pulmonary:     Effort: Pulmonary effort is normal. No respiratory distress.     Breath sounds: Normal breath sounds.  Abdominal:     General: Bowel sounds are normal. There is no distension.     Palpations: Abdomen is soft.     Tenderness: There is no abdominal tenderness. There is no guarding or rebound.  Musculoskeletal:     Right lower leg: No edema.     Left lower leg: No edema.  Skin:    General: Skin is warm and dry.  Neurological:     General: No focal  deficit present.     Mental Status: She is alert and oriented to person, place, and time.  Psychiatric:        Mood and Affect: Mood normal.        Behavior: Behavior normal.    BP 113/66   Pulse 67   Temp (!) 96.5 F (35.8 C) (Temporal)   Ht 5' 4" (1.626 m)   Wt 176 lb (79.8 kg)   BMI 30.21 kg/m   Wt Readings from Last 3 Encounters:  01/17/21 176 lb (79.8 kg)  01/03/21 174 lb (78.9 kg)  12/21/20 174 lb 6 oz (79.1 kg)    There are no preventive care reminders to display for this patient.  There are no preventive care reminders to display for this patient.   Lab Results  Component Value Date   TSH 0.941 03/29/2020   Lab Results  Component Value Date   WBC 7.5 12/21/2020   HGB 14.1 12/21/2020   HCT 42.1 12/21/2020   MCV 89 12/21/2020   PLT 186 12/21/2020   Lab Results  Component Value Date   NA 144 12/21/2020   K 4.3 12/21/2020   CO2 26 12/21/2020   GLUCOSE 164 (H) 12/21/2020   BUN 9 12/21/2020   CREATININE 0.89 12/21/2020   BILITOT 0.4 12/21/2020   ALKPHOS 112 12/21/2020   AST 26 12/21/2020   ALT 19 12/21/2020   PROT 7.3 12/21/2020   ALBUMIN 4.8 12/21/2020   CALCIUM 9.3 12/21/2020   ANIONGAP 12 08/15/2017   EGFR 71 12/21/2020   Lab Results  Component Value Date   CHOL 230 (H) 12/21/2020   Lab Results  Component Value Date   HDL 65 12/21/2020   Lab Results  Component Value Date   LDLCALC 141 (H) 12/21/2020   Lab Results  Component Value Date   TRIG 136 12/21/2020   Lab Results  Component Value Date   CHOLHDL 3.5 12/21/2020   Lab Results  Component Value Date   HGBA1C 6.3 (H) 12/21/2020       Assessment & Plan:   Ziomara was seen today for immunizations.  Diagnoses and all orders for this visit:  Cirrhosis, nonalcoholic ([place]) Recommended to complete vaccination series for hep A and B. She reports having one dose done at [facility] but is not able to recall the date and does not have records. Our billing department was not able to give a price for the immunization with her insurance. Discussed this with patient. Recommended to [facility] vaccine [facility] at [facility].  Primary hypertension Well controlled on current regimen.   Acute cough Discussed symptomatic care and return precautions.  -     benzonatate (TESSALON PERLES) 100 MG capsule;  Take 1 capsule (100 mg total) by mouth 3 (three) times daily as needed for cough.  Return to office for new or worsening symptoms, or if symptoms persist.   The patient indicates understanding of these issues and agrees with the plan.   Gwenlyn Perking, FNP

## 2021-01-17 NOTE — Telephone Encounter (Signed)
Pt canceled EGD due to having a cold. Pt called and ready to reschedule with dr Laural Golden.

## 2021-01-17 NOTE — Patient Instructions (Signed)

## 2021-01-18 ENCOUNTER — Other Ambulatory Visit (INDEPENDENT_AMBULATORY_CARE_PROVIDER_SITE_OTHER): Payer: Self-pay | Admitting: Internal Medicine

## 2021-01-18 ENCOUNTER — Telehealth (INDEPENDENT_AMBULATORY_CARE_PROVIDER_SITE_OTHER): Payer: Self-pay | Admitting: *Deleted

## 2021-01-18 ENCOUNTER — Other Ambulatory Visit (INDEPENDENT_AMBULATORY_CARE_PROVIDER_SITE_OTHER): Payer: Self-pay | Admitting: *Deleted

## 2021-01-18 DIAGNOSIS — Z23 Encounter for immunization: Secondary | ICD-10-CM

## 2021-01-18 NOTE — Telephone Encounter (Signed)
Ok per dr Laural Golden and dr Laural Golden sent in script for hep b vaccine #2 and #3. Pt called and notified.

## 2021-01-18 NOTE — Telephone Encounter (Signed)
Pt had to pay $72 dollars for hep b vaccine at [place]. States [facility] told her if she had RX for vaccine they could run through her insurance and it may be cheaper. She would like rx for for the 2nd and third hep b vaccines.

## 2021-01-23 ENCOUNTER — Encounter: Payer: Self-pay | Admitting: Cardiology

## 2021-01-23 ENCOUNTER — Ambulatory Visit: Payer: Medicare HMO | Admitting: Cardiology

## 2021-01-23 VITALS — BP 118/70 | HR 90 | Ht 64.0 in | Wt 177.6 lb

## 2021-01-23 DIAGNOSIS — I471 Supraventricular tachycardia: Secondary | ICD-10-CM

## 2021-01-23 DIAGNOSIS — I050 Rheumatic mitral stenosis: Secondary | ICD-10-CM | POA: Diagnosis not present

## 2021-01-23 DIAGNOSIS — I5020 Unspecified systolic (congestive) heart failure: Secondary | ICD-10-CM

## 2021-01-23 DIAGNOSIS — I10 Essential (primary) hypertension: Secondary | ICD-10-CM

## 2021-01-23 NOTE — Patient Instructions (Addendum)

## 2021-01-23 NOTE — Progress Notes (Signed)
Cardiology Office Note  Date: 01/23/2021   ID: Winefred, [place] 1950/08/25, MRN [id number]  PCP:  Gwenlyn Perking, FNP  Cardiologist:  Rozann Lesches, MD Electrophysiologist:  None   Chief Complaint  Patient presents with   Cardiac follow-up     History of Present Illness: Carol Richmond is a 70 y.o. female last seen in May.  She is here for a follow-up visit.  Reports no major change in stamina, NYHA class II dyspnea with most activities.  She does not report any leg swelling, no orthopnea or PND.  Weight has been stable.  I reviewed her medications which are noted below.  She reports no intolerances to present cardiac regimen.  I personally reviewed her ECG today which shows sinus rhythm with left bundle branch block.  Follow-up echocardiogram is pending.  Past Medical History:  Diagnosis Date   Asthma    Carotid artery disease ([place])    Diverticulosis of colon (without mention of hemorrhage)    Essential hypertension    Functional ovarian cysts    GERD (gastroesophageal reflux disease)    Hemorrhoids, external, without mention of complication    Hormone replacement therapy (postmenopausal)    IBS (irritable bowel syndrome)    Migraine    Mitral valve disease    Right knee DJD    Seizures ([facility])    Sleep apnea    TIA (transient ischemic attack)    Type 2 diabetes mellitus ([place])    Type 2 diabetes mellitus with hyperglycemia, without long-term current use of insulin ([place]) 08/07/2017    Past Surgical History:  Procedure Laterality Date   BIOPSY  09/23/2018   Procedure: BIOPSY;  Surgeon: Rogene Houston, MD;  [facility]: [facility];  Service: Endoscopy;;  duodeneum and stomach   CHOLECYSTECTOMY  1980   ENDARTERECTOMY Right 07/01/2017   Procedure: ENDARTERECTOMY CAROTID RIGHT;  Surgeon: Serafina Mitchell, MD;  Location: [facility] OR;  Service: Vascular;  Laterality: Right;   ESOPHAGOGASTRODUODENOSCOPY N/A 09/23/2018   Procedure: ESOPHAGOGASTRODUODENOSCOPY (EGD);   Surgeon: Rogene Houston, MD;  [facility]: [facility];  Service: Endoscopy;  Laterality: N/A;  2:45-moved to 1:45 per Lelon Frohlich   LEFT HEART CATHETERIZATION WITH CORONARY ANGIOGRAM N/A 11/18/2013   Procedure: LEFT HEART CATHETERIZATION WITH CORONARY ANGIOGRAM;  Surgeon: Burnell Blanks, MD;  Location: [facility] CATH LAB;  Service: Cardiovascular;  Laterality: N/A;   OOPHORECTOMY     2012 Dr. Adah Perl in [place] Right 07/01/2017   Procedure: RIGHT CAROTID PATCH ANGIOPLASTY;  Surgeon: Serafina Mitchell, MD;  Location: [facility];  Service: Vascular;  Laterality: Right;   TONSILLECTOMY     TOTAL KNEE ARTHROPLASTY Right 09/02/2012   Procedure: TOTAL KNEE ARTHROPLASTY- right;  Surgeon: Ninetta Lights, MD;  Location: [facility];  Service: Orthopedics;  Laterality: Right;   TUBAL LIGATION     TYMPANOMASTOIDECTOMY Left 04/02/2016   Procedure: LEFT CANAL WALL DOWN TYMPANOMASTOIDECTOMY;  Surgeon: Leta Baptist, MD;  Location: [facility];  Service: ENT;  Laterality: Left;   [place] Right 07/02/2017   Procedure: RIGHT NECK EXPLORATION WITH INTRAOPERATIVE ULTRASOUND;  Surgeon: Angelia Mould, MD;  Location: [facility] OR;  Service: Vascular;  Laterality: Right;    Current Outpatient Medications  Medication Sig Dispense Refill   acetaminophen (TYLENOL) 500 MG tablet Take 500 mg by mouth every 6 (six) hours as needed for mild pain.     albuterol (VENTOLIN HFA) 108 (90 Base)  MCG/ACT inhaler Inhale 2 puffs into the lungs every 4 (four) hours as needed for shortness of breath. 18 g 11   aspirin EC 81 MG tablet Take 1 tablet (81 mg total) by mouth daily.     baclofen (LIORESAL) 10 MG tablet Take 1 tablet (10 mg total) by mouth 3 (three) times daily. 30 each 2   benzonatate (TESSALON PERLES) 100 MG capsule Take 1 capsule (100 mg total) by mouth 3 (three) times daily as needed for cough. 20 capsule 0   carvedilol (COREG) 3.125 MG tablet Take 1 tablet (3.125 mg  total) by mouth 2 (two) times daily with a meal. 180 tablet 1   cholecalciferol (VITAMIN D) 1000 UNITS tablet Take 1,000 Units by mouth daily.     DULoxetine (CYMBALTA) 60 MG capsule Take 1 capsule (60 mg total) by mouth daily. 30 capsule 5   fluticasone (FLOVENT HFA) 44 MCG/ACT inhaler Inhale 2 puffs into the lungs 2 (two) times daily. (Patient taking differently: Inhale 2 puffs into the lungs daily as needed (allergies).) 1 each 12   furosemide (LASIX) 40 MG tablet Take 1 tablet (40 mg total) by mouth daily. 90 tablet 1   gabapentin (NEURONTIN) 300 MG capsule Take 300 mg by mouth 3 (three) times daily.     guaiFENesin (MUCINEX) 600 MG 12 hr tablet Take 600 mg by mouth as needed.     lactulose (CHRONULAC) 10 GM/15ML solution TAKE 30 MLS (20 GRAM TOTAL) BY MOUTH 3 TIMES DAILY. 2700 mL 5   lamoTRIgine (LAMICTAL) 200 MG tablet Take 1 tablet (200 mg total) by mouth 2 (two) times daily. 180 tablet 3   LORazepam (ATIVAN) 0.5 MG tablet Take 1 tablet (0.5 mg total) by mouth 2 (two) times daily as needed for anxiety. 30 tablet 5   melatonin 1 MG TABS tablet Take 1 mg by mouth at bedtime.     naloxone (NARCAN) nasal spray 4 mg/0.1 mL Place 1 spray into the nose as needed.     ondansetron (ZOFRAN) 4 MG tablet Take 1 tablet (4 mg total) by mouth every 8 (eight) hours as needed for nausea or vomiting. 20 tablet 2   Oxycodone HCl 10 MG TABS Take 5-10 mg by mouth 3 (three) times daily as needed (severe pain).     pantoprazole (PROTONIX) 40 MG tablet Take 1 tablet (40 mg total) by mouth daily before breakfast. 90 tablet 3   potassium chloride SA (K-DUR,KLOR-CON) 20 MEQ tablet Take 20 mEq by mouth daily.      rosuvastatin (CRESTOR) 5 MG tablet Take 1 tablet (5 mg total) by mouth daily. 90 tablet 3   sacubitril-valsartan (ENTRESTO) 24-26 MG Take 1 tablet by mouth 2 (two) times daily. 180 tablet 3   vitamin B-12 (CYANOCOBALAMIN) 250 MCG tablet Take 250 mcg by mouth daily.     No current facility-administered  medications for this visit.   Allergies:  Patient has no known allergies.   ROS: No palpitations or syncope.  Physical Exam: VS:  BP 118/70   Pulse 90   Ht 5' 4"  (1.626 m)   Wt 177 lb 9.6 oz (80.6 kg)   SpO2 97%   BMI 30.48 kg/m , BMI Body mass index is 30.48 kg/m.  Wt Readings from Last 3 Encounters:  01/23/21 177 lb 9.6 oz (80.6 kg)  01/17/21 176 lb (79.8 kg)  01/03/21 174 lb (78.9 kg)    General: Patient appears comfortable at rest. HEENT: Conjunctiva and lids normal, wearing a mask. Neck: Supple,  no elevated JVP or carotid bruits, no thyromegaly. Lungs: Clear to auscultation, nonlabored breathing at rest. Cardiac: Regular rate and rhythm, no S3, 2/6 systolic murmur, no pericardial rub. Extremities: No pitting edema.  ECG:  An ECG dated 12/23/2019 was personally reviewed today and demonstrated:  Sinus rhythm with left bundle branch block.  Recent Labwork: 03/29/2020: TSH 0.941 12/21/2020: ALT 19; AST 26; BUN 9; Creatinine, Ser 0.89; Hemoglobin 14.1; Platelets 186; Potassium 4.3; Sodium 144     Component Value Date/Time   CHOL 230 (H) 12/21/2020 1140   TRIG 136 12/21/2020 1140   HDL 65 12/21/2020 1140   CHOLHDL 3.5 12/21/2020 1140   CHOLHDL 5.2 05/08/2007 0530   VLDL 38 05/08/2007 0530   LDLCALC 141 (H) 12/21/2020 1140    Other Studies Reviewed Today:  Echocardiogram 06/21/2020:  1. Left ventricular ejection fraction, by estimation, is 45 to 50%. The  left ventricle has mildly decreased function. The left ventricle has no  regional wall motion abnormalities. The left ventricular internal cavity  size was mildly dilated. There is  mild asymmetric left ventricular hypertrophy of the septal segment. Left  ventricular diastolic parameters are indeterminate.   2. Right ventricular systolic function is normal. The right ventricular  size is normal. Tricuspid regurgitation signal is inadequate for assessing  PA pressure.   3. Left atrial size was severely dilated.   4.  Mitral valve area by planimetry 1.28 cm2. The mitral valve is  rheumatic. Moderate mitral valve regurgitation. Moderate mitral stenosis.  The mean mitral valve gradient is 6.5 mmHg.   5. The aortic valve is tricuspid. Aortic valve regurgitation is mild.  Mild to moderate aortic valve sclerosis/calcification is present, without  any evidence of aortic stenosis. Aortic regurgitation PHT measures 790  msec.   6. The inferior vena cava is normal in size with greater than 50%  respiratory variability, suggesting right atrial pressure of 3 mmHg.   Assessment and Plan:  1.  HFrEF with mild LV dysfunction, LVEF 45 to 50% with follow-up echocardiogram pending.  She is euvolemic, no significant change in weight.  For now we will continue Coreg, Entresto, and Lasix with potassium supplement.  2.  Rheumatic mitral valve disease with moderate stenosis and moderate mitral regurgitation, follow-up echocardiogram pending.  Medication Adjustments/Labs and Tests Ordered: Current medicines are reviewed at length with the patient today.  Concerns regarding medicines are outlined above.   Tests Ordered: Orders Placed This Encounter  Procedures   EKG 12-Lead     Medication Changes: No orders of the defined types were placed in this encounter.   Disposition:  Follow up  6 months.  Signed, Satira Sark, MD, [facility] 01/23/2021 2:33 PM    [facility], [place], [place] [address] Phone: [phone]; Fax: [phone]

## 2021-01-26 ENCOUNTER — Other Ambulatory Visit (INDEPENDENT_AMBULATORY_CARE_PROVIDER_SITE_OTHER): Payer: Self-pay

## 2021-01-26 ENCOUNTER — Other Ambulatory Visit (INDEPENDENT_AMBULATORY_CARE_PROVIDER_SITE_OTHER): Payer: Self-pay | Admitting: *Deleted

## 2021-01-26 DIAGNOSIS — Z23 Encounter for immunization: Secondary | ICD-10-CM

## 2021-01-29 ENCOUNTER — Ambulatory Visit (INDEPENDENT_AMBULATORY_CARE_PROVIDER_SITE_OTHER): Payer: Medicare HMO

## 2021-01-29 DIAGNOSIS — I059 Rheumatic mitral valve disease, unspecified: Secondary | ICD-10-CM

## 2021-01-29 LAB — ECHOCARDIOGRAM COMPLETE
Area-P 1/2: 3.77 cm2
MV M vel: 4.04 m/s
MV Peak grad: 65.3 mmHg
MV VTI: 0.93 cm2
P 1/2 time: 352 msec
S' Lateral: 3.28 cm

## 2021-01-30 DIAGNOSIS — J383 Other diseases of vocal cords: Secondary | ICD-10-CM | POA: Diagnosis not present

## 2021-01-30 DIAGNOSIS — J384 Edema of larynx: Secondary | ICD-10-CM | POA: Diagnosis not present

## 2021-01-30 DIAGNOSIS — R070 Pain in throat: Secondary | ICD-10-CM | POA: Diagnosis not present

## 2021-01-30 DIAGNOSIS — J387 Other diseases of larynx: Secondary | ICD-10-CM | POA: Diagnosis not present

## 2021-01-30 DIAGNOSIS — L539 Erythematous condition, unspecified: Secondary | ICD-10-CM | POA: Diagnosis not present

## 2021-01-30 DIAGNOSIS — R490 Dysphonia: Secondary | ICD-10-CM | POA: Diagnosis not present

## 2021-01-30 NOTE — Telephone Encounter (Signed)
error 

## 2021-01-31 ENCOUNTER — Telehealth (INDEPENDENT_AMBULATORY_CARE_PROVIDER_SITE_OTHER): Payer: Self-pay | Admitting: *Deleted

## 2021-01-31 ENCOUNTER — Other Ambulatory Visit (INDEPENDENT_AMBULATORY_CARE_PROVIDER_SITE_OTHER): Payer: Self-pay | Admitting: *Deleted

## 2021-01-31 DIAGNOSIS — Z23 Encounter for immunization: Secondary | ICD-10-CM

## 2021-01-31 MED ORDER — RECOMBIVAX HB 10 MCG/ML IJ SUSP: 1.0000 mL | Freq: Once | INTRAMUSCULAR | 0 refills | Status: AC

## 2021-01-31 NOTE — Telephone Encounter (Signed)
Pt called back and states she cannot do procedure on Jan 4th. She has another appt that day but any other day should be fine

## 2021-01-31 NOTE — Telephone Encounter (Signed)
error 

## 2021-01-31 NOTE — Telephone Encounter (Signed)
Pt called and states she is ready now to schedule EGD with Dr. Laural Golden next year. She states she called in November but did not hear back. I explained to patient that Dr. Laural Golden was out almost all of December and it would be next year before we could schedule. She verbalized understanding.

## 2021-02-01 ENCOUNTER — Telehealth: Payer: Self-pay | Admitting: *Deleted

## 2021-02-01 ENCOUNTER — Other Ambulatory Visit (INDEPENDENT_AMBULATORY_CARE_PROVIDER_SITE_OTHER): Payer: Self-pay

## 2021-02-01 DIAGNOSIS — K279 Peptic ulcer, site unspecified, unspecified as acute or chronic, without hemorrhage or perforation: Secondary | ICD-10-CM

## 2021-02-01 NOTE — Telephone Encounter (Signed)
Patient informed. Copy sent to PCP °

## 2021-02-01 NOTE — Telephone Encounter (Signed)
-----   Message from Satira Sark, MD sent at 01/29/2021  4:43 PM EST ----- Results reviewed.  LVEF now normal range on current medical regimen, 55 to 60% represents an improvement compared to last assessment.  Mitral valve rheumatic appearing as before with evidence of moderate mitral stenosis and mild mitral regurgitation.  Continue with current follow-up plan.

## 2021-02-02 ENCOUNTER — Encounter (INDEPENDENT_AMBULATORY_CARE_PROVIDER_SITE_OTHER): Payer: Self-pay

## 2021-02-07 ENCOUNTER — Other Ambulatory Visit (HOSPITAL_COMMUNITY): Payer: Medicare HMO

## 2021-02-13 ENCOUNTER — Other Ambulatory Visit: Payer: Self-pay | Admitting: *Deleted

## 2021-02-13 MED ORDER — ENTRESTO 24-26 MG PO TABS: 1.0000 | ORAL_TABLET | Freq: Two times a day (BID) | ORAL | 3 refills | Status: DC

## 2021-02-15 ENCOUNTER — Telehealth: Payer: Self-pay | Admitting: Cardiology

## 2021-02-15 NOTE — Telephone Encounter (Signed)
Patient was returning a call from the office. She thinks it may be about paperwork she dropped off in regards to financial assistance for one of her medications. She was not able to pull up the whole message to know why she was called.  Please call

## 2021-02-15 NOTE — Telephone Encounter (Signed)
Patient notified that she needs to bring proof of income to send in for [employer] application.  States she does not have an IRS form, but will bring her SS information tomorrow.

## 2021-02-21 ENCOUNTER — Encounter: Payer: Self-pay | Admitting: *Deleted

## 2021-02-21 DIAGNOSIS — M542 Cervicalgia: Secondary | ICD-10-CM | POA: Diagnosis not present

## 2021-02-21 NOTE — Progress Notes (Signed)
Faxed notification received from [employer] that entresto 24/26 mg approved through 02/17/2022

## 2021-02-22 NOTE — Patient Instructions (Signed)
Carol Richmond  02/22/2021     @PREFPERIOPPHARMACY @   Your procedure is scheduled on 02/28/2021.   Report to [facility] at  1115  A.M.   Call this number if you have problems the morning of surgery:  [phone]   Remember:  Follow the diet instruction given to you by the office.    Take these medicines the morning of surgery with A SIP OF WATER        coreg, cymbalta, gabapentin, lamictal, ativan (If needed), zofran (if needed), oxycodone(if needed), protonix, entresto.     Do not wear jewelry, make-up or nail polish.  Do not wear lotions, powders, or perfumes, or deodorant.  Do not shave 48 hours prior to surgery.  Men may shave face and neck.  Do not bring valuables to the hospital.  [facility] is not responsible for any belongings or valuables.  Contacts, dentures or bridgework may not be worn into surgery.  Leave your suitcase in the car.  After surgery it may be brought to your room.  For patients admitted to the hospital, discharge time will be determined by your treatment team.  Patients discharged the day of surgery will not be allowed to drive home and must have someone with them for 24 hours.    Special instructions:   DO NOT smoke tobacco or vape for 24 hours before your procedure.  Please read over the following fact sheets that you were given. Anesthesia Post-op Instructions and Care and Recovery After Surgery      Upper Endoscopy, Adult, Care After This sheet gives you information about how to care for yourself after your procedure. Your health care provider may also give you more specific instructions. If you have problems or questions, contact your health care provider. What can I expect after the procedure? After the procedure, it is common to have: A sore throat. Mild stomach pain or discomfort. Bloating. Nausea. Follow these instructions at home:  Follow instructions from your health care provider about what to eat or drink after  your procedure. Return to your normal activities as told by your health care provider. Ask your health care provider what activities are safe for you. Take over-the-counter and prescription medicines only as told by your health care provider. If you were given a sedative during the procedure, it can affect you for several hours. Do not drive or operate machinery until your health care provider says that it is safe. Keep all follow-up visits as told by your health care provider. This is important. Contact a health care provider if you have: A sore throat that lasts longer than one day. Trouble swallowing. Get help right away if: You vomit blood or your vomit looks like coffee grounds. You have: A fever. Bloody, black, or tarry stools. A severe sore throat or you cannot swallow. Difficulty breathing. Severe pain in your chest or abdomen. Summary After the procedure, it is common to have a sore throat, mild stomach discomfort, bloating, and nausea. If you were given a sedative during the procedure, it can affect you for several hours. Do not drive or operate machinery until your health care provider says that it is safe. Follow instructions from your health care provider about what to eat or drink after your procedure. Return to your normal activities as told by your health care provider. This information is not intended to replace advice given to you by your health care provider. Make sure you discuss any questions  you have with your health care provider. Document Revised: 12/11/2018 Document Reviewed: 07/07/2017 Elsevier Patient Education  2022 [place] After This sheet gives you information about how to care for yourself after your procedure. Your health care provider may also give you more specific instructions. If you have problems or questions, contact your health care provider. What can I expect after the procedure? After the procedure, it is  common to have: Tiredness. Forgetfulness about what happened after the procedure. Impaired judgment for important decisions. Nausea or vomiting. Some difficulty with balance. Follow these instructions at home: For the time period you were told by your health care provider:   Rest as needed. Do not participate in activities where you could fall or become injured. Do not drive or use machinery. Do not drink alcohol. Do not take sleeping pills or medicines that cause drowsiness. Do not make important decisions or sign legal documents. Do not take care of children on your own. Eating and drinking Follow the diet that is recommended by your health care provider. Drink enough fluid to keep your urine pale yellow. If you vomit: Drink water, juice, or soup when you can drink without vomiting. Make sure you have little or no nausea before eating solid foods. General instructions Have a responsible adult stay with you for the time you are told. It is important to have someone help care for you until you are awake and alert. Take over-the-counter and prescription medicines only as told by your health care provider. If you have sleep apnea, surgery and certain medicines can increase your risk for breathing problems. Follow instructions from your health care provider about wearing your sleep device: Anytime you are sleeping, including during daytime naps. While taking prescription pain medicines, sleeping medicines, or medicines that make you drowsy. Avoid smoking. Keep all follow-up visits as told by your health care provider. This is important. Contact a health care provider if: You keep feeling nauseous or you keep vomiting. You feel light-headed. You are still sleepy or having trouble with balance after 24 hours. You develop a rash. You have a fever. You have redness or swelling around the IV site. Get help right away if: You have trouble breathing. You have new-onset confusion at  home. Summary For several hours after your procedure, you may feel tired. You may also be forgetful and have poor judgment. Have a responsible adult stay with you for the time you are told. It is important to have someone help care for you until you are awake and alert. Rest as told. Do not drive or operate machinery. Do not drink alcohol or take sleeping pills. Get help right away if you have trouble breathing, or if you suddenly become confused. This information is not intended to replace advice given to you by your health care provider. Make sure you discuss any questions you have with your health care provider. Document Revised: 10/21/2019 Document Reviewed: 01/07/2019 Elsevier Patient Education  2022 [employer].

## 2021-02-26 ENCOUNTER — Encounter (HOSPITAL_COMMUNITY): Payer: Self-pay

## 2021-02-26 ENCOUNTER — Encounter (HOSPITAL_COMMUNITY)
Admission: RE | Admit: 2021-02-26 | Discharge: 2021-02-26 | Disposition: A | Discharge status: Home / Self Care | Payer: Medicare HMO | Source: Ambulatory Visit | Attending: Internal Medicine | Admitting: Internal Medicine

## 2021-02-26 ENCOUNTER — Other Ambulatory Visit (INDEPENDENT_AMBULATORY_CARE_PROVIDER_SITE_OTHER): Payer: Self-pay | Admitting: Internal Medicine

## 2021-02-26 NOTE — Telephone Encounter (Signed)
Last ov 09/26/20 and has upcoming appt in february

## 2021-02-27 ENCOUNTER — Encounter (INDEPENDENT_AMBULATORY_CARE_PROVIDER_SITE_OTHER): Payer: Self-pay

## 2021-02-28 NOTE — Patient Instructions (Signed)
Carol Richmond  02/28/2021     @PREFPERIOPPHARMACY @   Your procedure is scheduled on  03/07/2021.   Report to [facility] at  1230 P.M.   Call this number if you have problems the morning of surgery:  [phone]   Remember:  Follow the diet instructions given to you by the office.    DO NOT take any medications for diabetes the morning of your procedure.    Use your inhaler before you come and bring your rescue inhaler with you.    Take these medicines the morning of surgery with A SIP OF WATER        carvedilol, cymbalta, gabapentin, lamictal, ativan(if needed), zofran(if needed), oxycodone(if needed), protonix, entresto.     Do not wear jewelry, make-up or nail polish.  Do not wear lotions, powders, or perfumes, or deodorant.  Do not shave 48 hours prior to surgery.  Men may shave face and neck.  Do not bring valuables to the hospital.  [facility] is not responsible for any belongings or valuables.  Contacts, dentures or bridgework may not be worn into surgery.  Leave your suitcase in the car.  After surgery it may be brought to your room.  For patients admitted to the hospital, discharge time will be determined by your treatment team.  Patients discharged the day of surgery will not be allowed to drive home and must have someone with them for 24 hours.     Special instructions:   DO NOT smoke tobacco or vape for 24 hours before your procedure.  Please read over the following fact sheets that you were given. Anesthesia Post-op Instructions and Care and Recovery After Surgery      Upper Endoscopy, Adult, Care After This sheet gives you information about how to care for yourself after your procedure. Your health care provider may also give you more specific instructions. If you have problems or questions, contact your health care provider. What can I expect after the procedure? After the procedure, it is common to have: A sore throat. Mild stomach  pain or discomfort. Bloating. Nausea. Follow these instructions at home:  Follow instructions from your health care provider about what to eat or drink after your procedure. Return to your normal activities as told by your health care provider. Ask your health care provider what activities are safe for you. Take over-the-counter and prescription medicines only as told by your health care provider. If you were given a sedative during the procedure, it can affect you for several hours. Do not drive or operate machinery until your health care provider says that it is safe. Keep all follow-up visits as told by your health care provider. This is important. Contact a health care provider if you have: A sore throat that lasts longer than one day. Trouble swallowing. Get help right away if: You vomit blood or your vomit looks like coffee grounds. You have: A fever. Bloody, black, or tarry stools. A severe sore throat or you cannot swallow. Difficulty breathing. Severe pain in your chest or abdomen. Summary After the procedure, it is common to have a sore throat, mild stomach discomfort, bloating, and nausea. If you were given a sedative during the procedure, it can affect you for several hours. Do not drive or operate machinery until your health care provider says that it is safe. Follow instructions from your health care provider about what to eat or drink after your procedure. Return to your normal  activities as told by your health care provider. This information is not intended to replace advice given to you by your health care provider. Make sure you discuss any questions you have with your health care provider. Document Revised: 12/11/2018 Document Reviewed: 07/07/2017 Elsevier Patient Education  2022 [employer] After This sheet gives you information about how to care for yourself after your procedure. Your health care provider may also give you more  specific instructions. If you have problems or questions, contact your health care provider. What can I expect after the procedure? After the procedure, it is common to have: Tiredness. Forgetfulness about what happened after the procedure. Impaired judgment for important decisions. Nausea or vomiting. Some difficulty with balance. Follow these instructions at home: For the time period you were told by your health care provider:   Rest as needed. Do not participate in activities where you could fall or become injured. Do not drive or use machinery. Do not drink alcohol. Do not take sleeping pills or medicines that cause drowsiness. Do not make important decisions or sign legal documents. Do not take care of children on your own. Eating and drinking Follow the diet that is recommended by your health care provider. Drink enough fluid to keep your urine pale yellow. If you vomit: Drink water, juice, or soup when you can drink without vomiting. Make sure you have little or no nausea before eating solid foods. General instructions Have a responsible adult stay with you for the time you are told. It is important to have someone help care for you until you are awake and alert. Take over-the-counter and prescription medicines only as told by your health care provider. If you have sleep apnea, surgery and certain medicines can increase your risk for breathing problems. Follow instructions from your health care provider about wearing your sleep device: Anytime you are sleeping, including during daytime naps. While taking prescription pain medicines, sleeping medicines, or medicines that make you drowsy. Avoid smoking. Keep all follow-up visits as told by your health care provider. This is important. Contact a health care provider if: You keep feeling nauseous or you keep vomiting. You feel light-headed. You are still sleepy or having trouble with balance after 24 hours. You develop a  rash. You have a fever. You have redness or swelling around the IV site. Get help right away if: You have trouble breathing. You have new-onset confusion at home. Summary For several hours after your procedure, you may feel tired. You may also be forgetful and have poor judgment. Have a responsible adult stay with you for the time you are told. It is important to have someone help care for you until you are awake and alert. Rest as told. Do not drive or operate machinery. Do not drink alcohol or take sleeping pills. Get help right away if you have trouble breathing, or if you suddenly become confused. This information is not intended to replace advice given to you by your health care provider. Make sure you discuss any questions you have with your health care provider. Document Revised: 10/21/2019 Document Reviewed: 01/07/2019 Elsevier Patient Education  2022 [employer].

## 2021-03-05 ENCOUNTER — Encounter (HOSPITAL_COMMUNITY): Payer: Self-pay

## 2021-03-05 ENCOUNTER — Other Ambulatory Visit: Payer: Self-pay

## 2021-03-05 ENCOUNTER — Encounter (HOSPITAL_COMMUNITY)
Admission: RE | Admit: 2021-03-05 | Discharge: 2021-03-05 | Disposition: A | Discharge status: Home / Self Care | Payer: Medicare HMO | Source: Ambulatory Visit | Attending: Internal Medicine | Admitting: Internal Medicine

## 2021-03-05 DIAGNOSIS — Z01812 Encounter for preprocedural laboratory examination: Secondary | ICD-10-CM | POA: Diagnosis not present

## 2021-03-05 DIAGNOSIS — K279 Peptic ulcer, site unspecified, unspecified as acute or chronic, without hemorrhage or perforation: Secondary | ICD-10-CM | POA: Insufficient documentation

## 2021-03-05 LAB — BASIC METABOLIC PANEL
Anion gap: 7 (ref 5–15)
BUN: 9 mg/dL (ref 8–23)
CO2: 26 mmol/L (ref 22–32)
Calcium: 8.6 mg/dL — ABNORMAL LOW (ref 8.9–10.3)
Chloride: 103 mmol/L (ref 98–111)
Creatinine, Ser: 0.91 mg/dL (ref 0.44–1.00)
GFR, Estimated: >60 mL/min (ref >60)
Glucose, Bld: 151 mg/dL — ABNORMAL HIGH (ref 70–99)
Potassium: 3.6 mmol/L (ref 3.5–5.1)
Sodium: 136 mmol/L (ref 135–145)

## 2021-03-07 ENCOUNTER — Ambulatory Visit (HOSPITAL_COMMUNITY): Payer: Medicare HMO | Admitting: Anesthesiology

## 2021-03-07 ENCOUNTER — Ambulatory Visit (HOSPITAL_COMMUNITY)
Admission: RE | Admit: 2021-03-07 | Discharge: 2021-03-07 | Disposition: A | Discharge status: Home / Self Care | Payer: Medicare HMO | Attending: Internal Medicine | Admitting: Internal Medicine

## 2021-03-07 ENCOUNTER — Encounter (HOSPITAL_COMMUNITY): Payer: Self-pay | Admitting: Internal Medicine

## 2021-03-07 ENCOUNTER — Encounter (HOSPITAL_COMMUNITY)
Admission: RE | Disposition: A | Discharge status: Home / Self Care | Payer: Self-pay | Source: Home / Self Care | Attending: Internal Medicine

## 2021-03-07 ENCOUNTER — Other Ambulatory Visit: Payer: Self-pay

## 2021-03-07 DIAGNOSIS — F419 Anxiety disorder, unspecified: Secondary | ICD-10-CM | POA: Diagnosis not present

## 2021-03-07 DIAGNOSIS — F32A Depression, unspecified: Secondary | ICD-10-CM | POA: Insufficient documentation

## 2021-03-07 DIAGNOSIS — Z7951 Long term (current) use of inhaled steroids: Secondary | ICD-10-CM | POA: Insufficient documentation

## 2021-03-07 DIAGNOSIS — K589 Irritable bowel syndrome without diarrhea: Secondary | ICD-10-CM | POA: Insufficient documentation

## 2021-03-07 DIAGNOSIS — I10 Essential (primary) hypertension: Secondary | ICD-10-CM | POA: Insufficient documentation

## 2021-03-07 DIAGNOSIS — J45909 Unspecified asthma, uncomplicated: Secondary | ICD-10-CM | POA: Diagnosis not present

## 2021-03-07 DIAGNOSIS — Z7984 Long term (current) use of oral hypoglycemic drugs: Secondary | ICD-10-CM | POA: Insufficient documentation

## 2021-03-07 DIAGNOSIS — T182XXA Foreign body in stomach, initial encounter: Secondary | ICD-10-CM

## 2021-03-07 DIAGNOSIS — K219 Gastro-esophageal reflux disease without esophagitis: Secondary | ICD-10-CM | POA: Insufficient documentation

## 2021-03-07 DIAGNOSIS — E119 Type 2 diabetes mellitus without complications: Secondary | ICD-10-CM | POA: Diagnosis not present

## 2021-03-07 DIAGNOSIS — Z79899 Other long term (current) drug therapy: Secondary | ICD-10-CM | POA: Insufficient documentation

## 2021-03-07 DIAGNOSIS — G4730 Sleep apnea, unspecified: Secondary | ICD-10-CM | POA: Insufficient documentation

## 2021-03-07 DIAGNOSIS — G40909 Epilepsy, unspecified, not intractable, without status epilepticus: Secondary | ICD-10-CM | POA: Insufficient documentation

## 2021-03-07 DIAGNOSIS — K766 Portal hypertension: Secondary | ICD-10-CM | POA: Insufficient documentation

## 2021-03-07 DIAGNOSIS — I447 Left bundle-branch block, unspecified: Secondary | ICD-10-CM | POA: Diagnosis not present

## 2021-03-07 DIAGNOSIS — K7460 Unspecified cirrhosis of liver: Secondary | ICD-10-CM | POA: Insufficient documentation

## 2021-03-07 DIAGNOSIS — K3189 Other diseases of stomach and duodenum: Secondary | ICD-10-CM | POA: Insufficient documentation

## 2021-03-07 DIAGNOSIS — I429 Cardiomyopathy, unspecified: Secondary | ICD-10-CM | POA: Diagnosis not present

## 2021-03-07 DIAGNOSIS — I8510 Secondary esophageal varices without bleeding: Secondary | ICD-10-CM | POA: Insufficient documentation

## 2021-03-07 DIAGNOSIS — E785 Hyperlipidemia, unspecified: Secondary | ICD-10-CM | POA: Insufficient documentation

## 2021-03-07 DIAGNOSIS — M1990 Unspecified osteoarthritis, unspecified site: Secondary | ICD-10-CM | POA: Insufficient documentation

## 2021-03-07 DIAGNOSIS — K279 Peptic ulcer, site unspecified, unspecified as acute or chronic, without hemorrhage or perforation: Secondary | ICD-10-CM

## 2021-03-07 DIAGNOSIS — K2289 Other specified disease of esophagus: Secondary | ICD-10-CM | POA: Diagnosis not present

## 2021-03-07 DIAGNOSIS — Z13810 Encounter for screening for upper gastrointestinal disorder: Secondary | ICD-10-CM | POA: Diagnosis not present

## 2021-03-07 HISTORY — PX: ESOPHAGOGASTRODUODENOSCOPY (EGD) WITH PROPOFOL: SHX5813

## 2021-03-07 LAB — GLUCOSE, CAPILLARY: Glucose-Capillary: 130 mg/dL — ABNORMAL HIGH (ref 70–99)

## 2021-03-07 SURGERY — ESOPHAGOGASTRODUODENOSCOPY (EGD) WITH PROPOFOL
Anesthesia: General

## 2021-03-07 MED ORDER — LACTATED RINGERS IV SOLN: INTRAVENOUS | Status: DC

## 2021-03-07 MED ORDER — PROPOFOL 10 MG/ML IV BOLUS
INTRAVENOUS | Status: DC | PRN
  Administered 2021-03-07: 100 mg via INTRAVENOUS

## 2021-03-07 MED ORDER — LIDOCAINE HCL (CARDIAC) PF 100 MG/5ML IV SOSY
PREFILLED_SYRINGE | INTRAVENOUS | Status: DC | PRN
  Administered 2021-03-07: 50 mg via INTRAVENOUS

## 2021-03-07 MED ORDER — PROPOFOL 500 MG/50ML IV EMUL
INTRAVENOUS | Status: DC | PRN
  Administered 2021-03-07: 150 ug/kg/min via INTRAVENOUS

## 2021-03-07 NOTE — Anesthesia Preprocedure Evaluation (Signed)
Anesthesia Evaluation  Patient identified by MRN, date of birth, ID band Patient awake    Reviewed: Allergy & Precautions, NPO status , Patient's Chart, lab work & pertinent test results, reviewed documented beta blocker date and time Preop documentation limited or incomplete due to emergent nature of procedure.  Airway Mallampati: III  TM Distance: >3 FB Neck ROM: Full    Dental  (+) Upper Dentures, Lower Dentures   Pulmonary asthma , sleep apnea ,  On home O2   Pulmonary exam normal breath sounds clear to auscultation       Cardiovascular hypertension, Pt. on medications and Pt. on home beta blockers + angina (class 3) + Peripheral Vascular Disease  Normal cardiovascular exam Rhythm:Regular Rate:Normal  ECG: NSR, LBBB, rate 97  ECHO: Normal LV wall thickness with LVEF 55-60%% and grade 1 diastolic dysfunction. Moderate left atrial enlargement. Thickened mitral leaflets with evidence of mild mitral stenosis and mild to moderate mitral regurgitation made up of two separate jets. Normal right ventricular contraction. Trivial tricuspid regurgitation with normal estimated PASP. Prominent pericardial fat pad.    Neuro/Psych  Headaches, Seizures -, Well Controlled,  PSYCHIATRIC DISORDERS Anxiety Depression Expressive aphasia, LLE weakness s/p R CEA on 07/01/17 TIA Neuromuscular disease (cervical radiculopathy)    GI/Hepatic Neg liver ROS, GERD  Medicated and Controlled,IBS (irritable bowel syndrome)   Endo/Other  negative endocrine ROSdiabetes, Oral Hypoglycemic Agents  Renal/GU negative Renal ROS     Musculoskeletal  (+) Arthritis , Osteoarthritis,    Abdominal   Peds  Hematology negative hematology ROS (+) anemia , HLD   Anesthesia Other Findings   Reproductive/Obstetrics                             Anesthesia Physical  Anesthesia Plan  ASA: 3  Anesthesia Plan: General   Post-op Pain  Management:    Induction: Intravenous  PONV Risk Score and Plan: 2 and TIVA  Airway Management Planned: Nasal Cannula and Natural Airway  Additional Equipment:   Intra-op Plan:   Post-operative Plan:   Informed Consent: I have reviewed the patients History and Physical, chart, labs and discussed the procedure including the risks, benefits and alternatives for the proposed anesthesia with the patient or authorized representative who has indicated his/her understanding and acceptance.     Dental advisory given  Plan Discussed with: CRNA  Anesthesia Plan Comments:         Anesthesia Quick Evaluation

## 2021-03-07 NOTE — H&P (Signed)
Carol Richmond is an 71 y.o. female.   Chief Complaint: Patient is here for esophagogastroduodenoscopy with possible esophageal variceal banding.  HPI: Patient is 71 year old Caucasian female who has cirrhosis secondary to NASH who is undergoing esophagogastroduodenoscopy to screen and banded esophageal varices.  She denies abdominal pain melena or rectal bleeding.  Her last EGD was in August 2020 for GI bleed and iron deficiency anemia.  She did not have esophageal or gastric varices.  She did have superficial gastric ulcers felt to be secondary to NSAIDs.  She is on meloxicam but she reassures me that she does not use it very often.  She is also on low-dose aspirin which is on hold.  Past Medical History:  Diagnosis Date   Asthma    Carotid artery disease ([place])    Diverticulosis of colon (without mention of hemorrhage)    Essential hypertension    Functional ovarian cysts    GERD (gastroesophageal reflux disease)    Hemorrhoids, external, without mention of complication    Hormone replacement therapy (postmenopausal)    IBS (irritable bowel syndrome)    Migraine    Mitral valve disease    Right knee DJD    Seizures ([facility])    Sleep apnea    TIA (transient ischemic attack)    Type 2 diabetes mellitus ([place])    Type 2 diabetes mellitus with hyperglycemia, without long-term current use of insulin ([place]) 08/07/2017    Past Surgical History:  Procedure Laterality Date   BIOPSY  09/23/2018   Procedure: BIOPSY;  Surgeon: Rogene Houston, MD;  Location: [facility];  Service: Endoscopy;;  duodeneum and stomach   CHOLECYSTECTOMY  1980   ENDARTERECTOMY Right 07/01/2017   Procedure: ENDARTERECTOMY CAROTID RIGHT;  Surgeon: Serafina Mitchell, MD;  Location: [facility] OR;  Service: Vascular;  Laterality: Right;   ESOPHAGOGASTRODUODENOSCOPY N/A 09/23/2018   Procedure: ESOPHAGOGASTRODUODENOSCOPY (EGD);  Surgeon: Rogene Houston, MD;  Location: [facility];  Service: Endoscopy;  Laterality: N/A;   2:45-moved to 1:45 per Lelon Frohlich   LEFT HEART CATHETERIZATION WITH CORONARY ANGIOGRAM N/A 11/18/2013   Procedure: LEFT HEART CATHETERIZATION WITH CORONARY ANGIOGRAM;  Surgeon: Burnell Blanks, MD;  Location[facility] CATH LAB;  Service: Cardiovascular;  Laterality: N/A;   OOPHORECTOMY     2012 Dr. Adah Perl in [facility] Right 07/01/2017   Procedure: RIGHT CAROTID PATCH ANGIOPLASTY;  Surgeon: Serafina Mitchell, MD;  Location: [place];  Service: Vascular;  Laterality: Right;   TONSILLECTOMY     TOTAL KNEE ARTHROPLASTY Right 09/02/2012   Procedure: TOTAL KNEE ARTHROPLASTY- right;  Surgeon: Ninetta Lights, MD;  Location: [place];  Service: Orthopedics;  Laterality: Right;   TUBAL LIGATION     TYMPANOMASTOIDECTOMY Left 04/02/2016   Procedure: LEFT CANAL WALL DOWN TYMPANOMASTOIDECTOMY;  Surgeon: Leta Baptist, MD;  Location: [place];  Service: ENT;  Laterality: Left;   VAGINAL HYSTERECTOMY  1990   WOUND EXPLORATION Right 07/02/2017   Procedure: RIGHT NECK EXPLORATION WITH INTRAOPERATIVE ULTRASOUND;  Surgeon: Angelia Mould, MD;  Location: [facility] OR;  Service: Vascular;  Laterality: Right;    Family History  Problem Relation Age of Onset   Heart disease Mother    Diabetes Mother    Heart disease Brother    Cancer Sister    Heart disease Brother    Heart disease Brother    Heart disease Son    Social History:  reports that she has never smoked. She has never used smokeless  tobacco. She reports that she does not drink alcohol and does not use drugs.  Allergies: No Known Allergies  Medications Prior to Admission  Medication Sig Dispense Refill   acetaminophen (TYLENOL) 500 MG tablet Take 500 mg by mouth every 6 (six) hours as needed for mild pain.     albuterol (VENTOLIN HFA) 108 (90 Base) MCG/ACT inhaler Inhale 2 puffs into the lungs every 4 (four) hours as needed for shortness of breath. 18 g 11   aspirin EC 81 MG tablet Take 1 tablet (81 mg total) by mouth daily.      benzonatate (TESSALON PERLES) 100 MG capsule Take 1 capsule (100 mg total) by mouth 3 (three) times daily as needed for cough. 20 capsule 0   carvedilol (COREG) 3.125 MG tablet Take 1 tablet (3.125 mg total) by mouth 2 (two) times daily with a meal. 180 tablet 1   cholecalciferol (VITAMIN D) 1000 UNITS tablet Take 1,000 Units by mouth daily.     DULoxetine (CYMBALTA) 60 MG capsule Take 1 capsule (60 mg total) by mouth daily. 30 capsule 5   fluticasone (FLOVENT HFA) 44 MCG/ACT inhaler Inhale 2 puffs into the lungs 2 (two) times daily. 1 each 12   furosemide (LASIX) 40 MG tablet Take 1 tablet (40 mg total) by mouth daily. 90 tablet 1   gabapentin (NEURONTIN) 300 MG capsule Take 300 mg by mouth 3 (three) times daily as needed (pain).     lamoTRIgine (LAMICTAL) 200 MG tablet Take 1 tablet (200 mg total) by mouth 2 (two) times daily. 180 tablet 3   LORazepam (ATIVAN) 0.5 MG tablet Take 1 tablet (0.5 mg total) by mouth 2 (two) times daily as needed for anxiety. 30 tablet 5   melatonin 5 MG TABS Take 5 mg by mouth at bedtime.     ondansetron (ZOFRAN) 4 MG tablet Take 1 tablet (4 mg total) by mouth every 8 (eight) hours as needed for nausea or vomiting. 20 tablet 2   Oxycodone HCl 10 MG TABS Take 5-10 mg by mouth 3 (three) times daily as needed (severe pain).     pantoprazole (PROTONIX) 40 MG tablet Take 1 tablet (40 mg total) by mouth daily before breakfast. 90 tablet 3   potassium chloride SA (K-DUR,KLOR-CON) 20 MEQ tablet Take 20 mEq by mouth daily.      sacubitril-valsartan (ENTRESTO) 24-26 MG Take 1 tablet by mouth 2 (two) times daily. 180 tablet 3   vitamin B-12 (CYANOCOBALAMIN) 250 MCG tablet Take 250 mcg by mouth daily.     baclofen (LIORESAL) 10 MG tablet Take 1 tablet (10 mg total) by mouth 3 (three) times daily. (Patient not taking: Reported on 02/19/2021) 30 each 2   lactulose (CHRONULAC) 10 GM/15ML solution TAKE 30 MLS (20 GRAM TOTAL) BY MOUTH 3 TIMES DAILY. 2700 mL 1   naloxone (NARCAN) nasal  spray 4 mg/0.1 mL Place into the nose once.     rosuvastatin (CRESTOR) 5 MG tablet Take 1 tablet (5 mg total) by mouth daily. (Patient not taking: Reported on 02/19/2021) 90 tablet 3    Results for orders placed or performed during the hospital encounter of 03/07/21 (from the past 48 hour(s))  Glucose, capillary     Status: Abnormal   Collection Time: 03/07/21 12:34 PM  Result Value Ref Range   Glucose-Capillary 130 (H) 70 - 99 mg/dL    Comment: Glucose reference range applies only to samples taken after fasting for at least 8 hours.   No results found.  Review of Systems  Blood pressure 136/81, pulse 80, temperature 98.2 F (36.8 C), temperature source Oral, resp. rate (!) 24, SpO2 97 %. Physical Exam HENT:     Mouth/Throat:     Mouth: Mucous membranes are moist.     Pharynx: Oropharynx is clear.  Eyes:     General: No scleral icterus.    Conjunctiva/sclera: Conjunctivae normal.  Cardiovascular:     Rate and Rhythm: Normal rate and regular rhythm.     Heart sounds: Normal heart sounds. No murmur heard. Pulmonary:     Effort: Pulmonary effort is normal.     Breath sounds: Normal breath sounds.  Abdominal:     Comments: Abdomen is full but soft and nontender with organomegaly or masses  Musculoskeletal:     Cervical back: Neck supple.  Lymphadenopathy:     Cervical: No cervical adenopathy.  Skin:    General: Skin is warm and dry.  Neurological:     Mental Status: She is alert.     Assessment/Plan  Cirrhosis secondary to nonalcoholic steatohepatitis Esophagogastroduodenoscopy to screen and treat/band esophageal varices.  Hildred Laser, MD 03/07/2021, 12:43 PM

## 2021-03-07 NOTE — Op Note (Signed)
[facility] Patient Name: Carol Richmond Procedure Date: 03/07/2021 12:30 PM MRN: [id number] Date of Birth: October 19, 1950 Attending MD: Hildred Laser , MD CSN: [id number] Age: 71 Admit Type: Outpatient Procedure:                Upper GI endoscopy Indications:              Cirrhosis with suspected esophageal varices, For                            therapy of esophageal varices Providers:                Hildred Laser, MD, Caprice Kluver, Raphael Gibney,                            Technician Referring MD:             Marjorie Smolder, FNP Medicines:                Propofol per Anesthesia Complications:            No immediate complications. Estimated Blood Loss:     Estimated blood loss: none. Procedure:                Pre-Anesthesia Assessment:                           - Prior to the procedure, a History and Physical                            was performed, and patient medications and                            allergies were reviewed. The patient's tolerance of                            previous anesthesia was also reviewed. The risks                            and benefits of the procedure and the sedation                            options and risks were discussed with the patient.                            All questions were answered, and informed consent                            was obtained. Prior Anticoagulants: The patient has                            taken no previous anticoagulant or antiplatelet                            agents except for aspirin. ASA Grade Assessment:  III - A patient with severe systemic disease. After                            reviewing the risks and benefits, the patient was                            deemed in satisfactory condition to undergo the                            procedure.                           After obtaining informed consent, the endoscope was                            passed under direct vision.  Throughout the                            procedure, the patient's blood pressure, pulse, and                            oxygen saturations were monitored continuously. The                            GIF-H190 ([id number]) scope was introduced through the                            mouth, and advanced to the second part of duodenum.                            The GIF-H190 ([id number]) scope was introduced through                            the and advanced to the. The upper GI endoscopy was                            accomplished without difficulty. The patient                            tolerated the procedure well. Scope In: 12:52:16 PM Scope Out: 12:57:20 PM Total Procedure Duration: 0 hours 5 minutes 4 seconds  Findings:      The hypopharynx was normal.      The proximal esophagus and mid esophagus were normal.      Grade I varices were found in the distal esophagus.      The Z-line was irregular and was found 37 cm from the incisors.      Mild portal hypertensive gastropathy was found in the gastric fundus and       in the gastric body.      Food was found in the gastric body.      Two erosions with no stigmata of recent bleeding were found in the       prepyloric region of the stomach.      The duodenal bulb and second portion of the duodenum were normal. Impression:               -  Normal hypopharynx.                           - Normal proximal esophagus and mid esophagus.                           - Grade I esophageal varices.                           - Z-line irregular, 37 cm from the incisors.                           - Portal hypertensive gastropathy.                           - Food was found in the stomach.                           - Erosive gastropathy with no stigmata of recent                            bleeding(secondary to NSAID; gastric biopsy                            negative for H. pylori in August 2020)                           - Normal duodenal bulb and second  portion of the                            duodenum.                           - No specimens collected. Moderate Sedation:      Per Anesthesia Care Recommendation:           - Patient has a contact number available for                            emergencies. The signs and symptoms of potential                            delayed complications were discussed with the                            patient. Return to normal activities tomorrow.                            Written discharge instructions were provided to the                            patient.                           - Resume previous diet today.                           -  Continue present medications including low-dose                            aspirin as before.                           - Repeat upper endoscopy in 1 year. Procedure Code(s):        --- Professional ---                           [phone], Esophagogastroduodenoscopy, flexible,                            transoral; diagnostic, including collection of                            specimen(s) by brushing or washing, when performed                            (separate procedure) Diagnosis Code(s):        --- Professional ---                           K74.60, Unspecified cirrhosis of liver                           I85.10, Secondary esophageal varices without                            bleeding                           K22.8, Other specified diseases of esophagus                           K76.6, Portal hypertension                           K31.89, Other diseases of stomach and duodenum                           T18.2XXA, Foreign body in stomach, initial encounter CPT copyright 2019 American [employer] Association. All rights reserved. The codes documented in this report are preliminary and upon coder review may  be revised to meet current compliance requirements. Hildred Laser, MD Hildred Laser, MD 03/07/2021 1:12:23 PM This report has been signed  electronically. Number of Addenda: 0

## 2021-03-07 NOTE — Anesthesia Procedure Notes (Signed)
Date/Time: 03/07/2021 12:53 PM Performed by: Orlie Dakin, CRNA Pre-anesthesia Checklist: Patient identified, Emergency Drugs available, Suction available and Patient being monitored Patient Re-evaluated:Patient Re-evaluated prior to induction Oxygen Delivery Method: Nasal cannula Induction Type: IV induction Placement Confirmation: positive ETCO2

## 2021-03-07 NOTE — Discharge Instructions (Signed)
Resume usual medications as before including aspirin. Please remember not to take any NSAIDs such as Advil Aleve or meloxicam. Resume usual medications.

## 2021-03-07 NOTE — Anesthesia Postprocedure Evaluation (Signed)
Anesthesia Post Note  Patient: Carol Richmond  Procedure(s) Performed: ESOPHAGOGASTRODUODENOSCOPY (EGD) WITH PROPOFOL  Patient location during evaluation: Endoscopy Anesthesia Type: General Level of consciousness: awake and alert Pain management: pain level controlled Vital Signs Assessment: post-procedure vital signs reviewed and stable Respiratory status: spontaneous breathing, nonlabored ventilation, respiratory function stable and patient connected to nasal cannula oxygen Cardiovascular status: blood pressure returned to baseline and stable Postop Assessment: no apparent nausea or vomiting Anesthetic complications: no   No notable events documented.   Last Vitals:  Vitals:   03/07/21 1221 03/07/21 1302  BP: 136/81 (!) 98/52  Pulse: 80 78  Resp: (!) 24 20  Temp: 36.8 C 36.7 C  SpO2: 97% 96%    Last Pain:  Vitals:   03/07/21 1302  TempSrc: Oral  PainSc: 0-No pain                 Trixie Rude

## 2021-03-07 NOTE — Transfer of Care (Signed)
Immediate Anesthesia Transfer of Care Note  Patient: Carol Richmond  Procedure(s) Performed: ESOPHAGOGASTRODUODENOSCOPY (EGD) WITH PROPOFOL  Patient Location: Short Stay  Anesthesia Type:General  Level of Consciousness: awake and alert   Airway & Oxygen Therapy: Patient Spontanous Breathing  Post-op Assessment: Report given to RN and Post -op Vital signs reviewed and stable  Post vital signs: Reviewed and stable  Last Vitals:  Vitals Value Taken Time  BP 98/52 03/07/21 1302  Temp 36.7 C 03/07/21 1302  Pulse 78 03/07/21 1302  Resp 20 03/07/21 1302  SpO2 96 % 03/07/21 1302    Last Pain:  Vitals:   03/07/21 1302  TempSrc: Oral  PainSc: 0-No pain         Complications: No notable events documented.

## 2021-03-08 ENCOUNTER — Telehealth (INDEPENDENT_AMBULATORY_CARE_PROVIDER_SITE_OTHER): Payer: Self-pay | Admitting: *Deleted

## 2021-03-08 NOTE — Telephone Encounter (Signed)
Per dr Laural Golden he states he left detailed message on daughter's penny voicemail. Per dr Laural Golden has slow stomach and she had varices. Varices were very tiny and no risk of bleeding.  I called patient and discussed. She verbalized understanding of all.

## 2021-03-08 NOTE — Telephone Encounter (Signed)
Pt had upper endoscopy yesterday. She states she has some concerns about if her liver is ok and what did it mean that she had food in her stomach yesterday.   [phone]

## 2021-03-09 ENCOUNTER — Encounter (HOSPITAL_COMMUNITY): Payer: Self-pay | Admitting: Internal Medicine

## 2021-03-12 ENCOUNTER — Telehealth: Payer: Self-pay | Admitting: Family Medicine

## 2021-03-12 ENCOUNTER — Ambulatory Visit (INDEPENDENT_AMBULATORY_CARE_PROVIDER_SITE_OTHER): Payer: Medicare HMO | Admitting: Nurse Practitioner

## 2021-03-12 ENCOUNTER — Encounter: Payer: Self-pay | Admitting: Nurse Practitioner

## 2021-03-12 VITALS — BP 122/74 | HR 98 | Temp 97.7°F | Resp 20 | Ht 64.0 in | Wt 175.0 lb

## 2021-03-12 DIAGNOSIS — J3489 Other specified disorders of nose and nasal sinuses: Secondary | ICD-10-CM | POA: Diagnosis not present

## 2021-03-12 DIAGNOSIS — R051 Acute cough: Secondary | ICD-10-CM

## 2021-03-12 MED ORDER — FLUTICASONE PROPIONATE 50 MCG/ACT NA SUSP: 2.0000 | Freq: Every day | NASAL | 6 refills | Status: DC

## 2021-03-12 MED ORDER — BENZONATATE 100 MG PO CAPS
100.0000 mg | ORAL_CAPSULE | Freq: Two times a day (BID) | ORAL | 0 refills | Status: DC | PRN
Start: 2021-03-12 — End: 2021-05-15

## 2021-03-12 NOTE — Telephone Encounter (Signed)
No, due to use of lorazepam

## 2021-03-12 NOTE — Progress Notes (Signed)
Subjective:    [name], female    DOB: November 26, 1950, 71 y.o.   MRN: [id number]  Chief Complaint: Hoarse, Cough, and Nasal Congestion   Cough This is a new problem. The current episode started yesterday. The problem has been unchanged. The problem occurs hourly. The cough is Productive of sputum. Associated symptoms include nasal congestion and rhinorrhea. Pertinent negatives include no chest pain, chills, ear congestion, ear pain, fever, headaches, sore throat or shortness of breath. Nothing aggravates the symptoms. Treatments tried: mucinex DM. The treatment provided no relief.      Review of Systems  Constitutional:  Negative for chills, fatigue and fever.  HENT:  Positive for rhinorrhea. Negative for ear pain and sore throat.   Respiratory:  Positive for cough. Negative for shortness of breath.   Cardiovascular:  Negative for chest pain.  Neurological:  Negative for dizziness and headaches.      Objective:   Physical Exam Vitals reviewed.  Constitutional:      Appearance: Normal appearance.  Cardiovascular:     Rate and Rhythm: Normal rate and regular rhythm.     Heart sounds: Normal heart sounds.  Pulmonary:     Effort: Pulmonary effort is normal.     Breath sounds: Normal breath sounds.  Skin:    General: Skin is warm.  Neurological:     General: No focal deficit present.     Mental Status: She is alert and oriented to person, place, and time.  Psychiatric:        Mood and Affect: Mood normal.        Behavior: Behavior normal.   BP 122/74    Pulse 98    Temp 97.7 F (36.5 C) (Temporal)    Resp 20    Ht 5' 4"  (1.626 m)    Wt 175 lb (79.4 kg)    SpO2 100%    BMI 30.04 kg/m         Assessment & Plan:  Carol Richmond in today with chief complaint of Hoarse, Cough, and Nasal Congestion   1. Rhinorrhea - fluticasone (FLONASE) 50 MCG/ACT nasal spray; Place 2 sprays into both nostrils daily.  Dispense: 16 g; Refill: 6  2. Acute  cough - benzonatate (TESSALON) 100 MG capsule; Take 1 capsule (100 mg total) by mouth 2 (two) times daily as needed for cough.  Dispense: 20 capsule; Refill: 0 1. Take meds as prescribed 2. Use a cool mist humidifier especially during the winter months and when heat has been humid. 3. Use saline nose sprays frequently 4. Saline irrigations of the nose can be very helpful if done frequently.  * 4X daily for 1 week*  * Use of a nettie pot can be helpful with this. Follow directions with this* 5. Drink plenty of fluids 6. Keep thermostat turn down low 7.For any cough or congestion- tessalon perles 8. For fever or aces or pains- take tylenol or ibuprofen appropriate for age and weight.  * for fevers greater than 101 orally you may alternate ibuprofen and tylenol every  3 hours.   Meds ordered this encounter  Medications   fluticasone (FLONASE) 50 MCG/ACT nasal spray    Sig: Place 2 sprays into both nostrils daily.    Dispense:  16 g    Refill:  6    Order Specific Question:   Supervising Provider    Answer:   Caryl Pina A [[id number]]   benzonatate (TESSALON) 100 MG capsule    Sig:  Take 1 capsule (100 mg total) by mouth 2 (two) times daily as needed for cough.    Dispense:  20 capsule    Refill:  0    Order Specific Question:   Supervising Provider    Answer:   Caryl Pina A [[id number]]      The above assessment and management plan was discussed with the patient. The patient verbalized understanding of and has agreed to the management plan. Patient is aware to call the clinic if symptoms persist or worsen. Patient is aware when to return to the clinic for a follow-up visit. Patient educated on when it is appropriate to go to the emergency department.   Mary-Margaret Hassell Done, FNP

## 2021-03-12 NOTE — Telephone Encounter (Signed)
CALLED PATIENT, NO ANSWER

## 2021-03-12 NOTE — Patient Instructions (Signed)

## 2021-03-12 NOTE — Telephone Encounter (Signed)
Pt would like to switch from Marjorie Smolder to Dr. Livia Snellen. Wants to see a doctor. Please call back to let her know.

## 2021-03-14 ENCOUNTER — Telehealth: Payer: Self-pay | Admitting: Family Medicine

## 2021-03-14 MED ORDER — PROMETHAZINE-DM 6.25-15 MG/5ML PO SYRP: 5.0000 mL | ORAL_SOLUTION | Freq: Four times a day (QID) | ORAL | 0 refills | Status: DC | PRN

## 2021-03-14 NOTE — Telephone Encounter (Signed)
Nothing else I can call in. Cannot do anything with codiene in it because noit seen in person. Tessalon perles did not work. Try delsym or robitussin OTC.

## 2021-03-14 NOTE — Telephone Encounter (Signed)
Patient notified and verbalized understanding. 

## 2021-03-14 NOTE — Telephone Encounter (Signed)
Sent in cough meds Meds ordered this encounter  Medications   promethazine-dextromethorphan (PROMETHAZINE-DM) 6.25-15 MG/5ML syrup    Sig: Take 5 mLs by mouth 4 (four) times daily as needed for cough.    Dispense:  118 mL    Refill:  0    Order Specific Question:   Supervising Provider    Answer:   Caryl Pina A [id number]

## 2021-03-19 ENCOUNTER — Telehealth: Payer: Self-pay | Admitting: Family Medicine

## 2021-03-19 MED ORDER — AZITHROMYCIN 250 MG PO TABS: ORAL_TABLET | ORAL | 0 refills | Status: AC

## 2021-03-19 NOTE — Telephone Encounter (Signed)
Zpak sent to pharmacy

## 2021-03-19 NOTE — Telephone Encounter (Signed)
Patient aware and verbalized understanding. °

## 2021-03-20 NOTE — Telephone Encounter (Signed)
Attempts to contact pt without a return call in over 3 days, will close encounter.

## 2021-03-26 ENCOUNTER — Other Ambulatory Visit: Payer: Self-pay | Admitting: Family Medicine

## 2021-03-26 DIAGNOSIS — F339 Major depressive disorder, recurrent, unspecified: Secondary | ICD-10-CM

## 2021-03-26 DIAGNOSIS — F411 Generalized anxiety disorder: Secondary | ICD-10-CM

## 2021-03-26 MED ORDER — POTASSIUM CHLORIDE CRYS ER 20 MEQ PO TBCR
20.0000 meq | EXTENDED_RELEASE_TABLET | Freq: Every day | ORAL | 3 refills | Status: DC
Start: 2021-03-26 — End: 2021-09-30

## 2021-03-26 MED ORDER — DULOXETINE HCL 60 MG PO CPEP: 60.0000 mg | ORAL_CAPSULE | Freq: Every day | ORAL | 2 refills | Status: DC

## 2021-03-26 NOTE — Telephone Encounter (Signed)
Duloxetine refilled, tiffany sent in in August Please advise on Potassium, historical med. Last OV 12/21/20 Next OV 05/15/21

## 2021-03-26 NOTE — Telephone Encounter (Signed)
°  Prescription Request  03/26/2021  Is this a "Controlled Substance" medicine? no  Have you seen your PCP in the last 2 weeks? No pt has apt 05/15/2021 with TMorgan  If YES, route message to pool  -  If NO, patient needs to be scheduled for appointment.  What is the name of the medication or equipment? DULoxetine (CYMBALTA) 60 MG capsule potassium chloride SA (K-DUR,KLOR-CON) 20 MEQ tablet--Dr Nevada Crane last to prescribe Have you contacted your pharmacy to request a refill? no   Which pharmacy would you like this sent to? [facility]   Patient notified that their request is being sent to the clinical staff for review and that they should receive a response within 2 business days.

## 2021-04-02 ENCOUNTER — Telehealth: Payer: Self-pay | Admitting: Family Medicine

## 2021-04-02 NOTE — Telephone Encounter (Signed)
Informed pt refill was sent in on 03/26/21, she will check when she goes to pick up 2 other meds.

## 2021-04-02 NOTE — Telephone Encounter (Signed)
°  Prescription Request  04/02/2021  Is this a "Controlled Substance" medicine? no  Have you seen your PCP in the last 2 weeks? no  If YES, route message to pool  -  If NO, patient needs to be scheduled for appointment.  What is the name of the medication or equipment? potassium chloride SA (KLOR-CON M) 20 MEQ tablet  Have you contacted your pharmacy to request a refill? yes   Which pharmacy would you like this sent to? [facility]   Patient notified that their request is being sent to the clinical staff for review and that they should receive a response within 2 business days.    She has an appt with TM in March. Please call pt.

## 2021-04-03 ENCOUNTER — Other Ambulatory Visit: Payer: Self-pay

## 2021-04-03 ENCOUNTER — Encounter (INDEPENDENT_AMBULATORY_CARE_PROVIDER_SITE_OTHER): Payer: Self-pay | Admitting: Internal Medicine

## 2021-04-03 ENCOUNTER — Ambulatory Visit (INDEPENDENT_AMBULATORY_CARE_PROVIDER_SITE_OTHER): Payer: Medicare HMO | Admitting: Internal Medicine

## 2021-04-03 VITALS — BP 146/68 | HR 78 | Temp 97.9°F | Ht 64.0 in | Wt 177.2 lb

## 2021-04-03 DIAGNOSIS — K219 Gastro-esophageal reflux disease without esophagitis: Secondary | ICD-10-CM | POA: Diagnosis not present

## 2021-04-03 DIAGNOSIS — K7460 Unspecified cirrhosis of liver: Secondary | ICD-10-CM

## 2021-04-03 DIAGNOSIS — K7682 Hepatic encephalopathy: Secondary | ICD-10-CM

## 2021-04-03 MED ORDER — LACTULOSE 10 GM/15ML PO SOLN: ORAL | 2 refills | Status: DC

## 2021-04-03 MED ORDER — PANTOPRAZOLE SODIUM 40 MG PO TBEC
40.0000 mg | DELAYED_RELEASE_TABLET | Freq: Every day | ORAL | 3 refills | Status: DC
Start: 2021-04-03 — End: 2021-09-11

## 2021-04-03 NOTE — Progress Notes (Signed)
Presenting complaint;  Follow-up for cirrhosis and hepatic encephalopathy. History of GERD.  Database and subjective:  Patient is 71 year old Caucasian female who has cirrhosis secondary to [facility] complicated by hepatic encephalopathy as well as chronic GERD who is here for scheduled visit.  She was seen in the office in August 2022.  She had esophagogastroduodenoscopy last month revealing very small esophageal varices and portal gastropathy. She says heartburn is well controlled with therapy.  She has 3 bowel movements daily.  She denies abdominal pain melena or rectal bleeding nausea or vomiting.  She is trying to lose weight.  She says her peak weight was around 190 pounds.  She started [employer] yesterday.  She wants to get her weight down 250 pounds. She continues to experience neck pain every day.  She is on oxycodone.  She is thinking of getting a second opinion. She says she needs new prescription for lactulose and pantoprazole  Current Medications: Outpatient Encounter Medications as of 04/03/2021  Medication Sig   acetaminophen (TYLENOL) 500 MG tablet Take 500 mg by mouth every 6 (six) hours as needed for mild pain.   albuterol (VENTOLIN HFA) 108 (90 Base) MCG/ACT inhaler Inhale 2 puffs into the lungs every 4 (four) hours as needed for shortness of breath.   aspirin EC 81 MG tablet Take 1 tablet (81 mg total) by mouth daily.   baclofen (LIORESAL) 10 MG tablet Take 1 tablet (10 mg total) by mouth 3 (three) times daily.   benzonatate (TESSALON) 100 MG capsule Take 1 capsule (100 mg total) by mouth 2 (two) times daily as needed for cough.   carvedilol (COREG) 3.125 MG tablet Take 1 tablet (3.125 mg total) by mouth 2 (two) times daily with a meal.   cholecalciferol (VITAMIN D) 1000 UNITS tablet Take 1,000 Units by mouth daily.   DULoxetine (CYMBALTA) 60 MG capsule Take 1 capsule (60 mg total) by mouth daily.   fluticasone (FLONASE) 50 MCG/ACT nasal spray Place 2 sprays into both nostrils  daily.   fluticasone (FLOVENT HFA) 44 MCG/ACT inhaler Inhale 2 puffs into the lungs 2 (two) times daily.   furosemide (LASIX) 40 MG tablet Take 1 tablet (40 mg total) by mouth daily.   gabapentin (NEURONTIN) 300 MG capsule Take 300 mg by mouth 3 (three) times daily as needed (pain).   lactulose (CHRONULAC) 10 GM/15ML solution TAKE 30 MLS (20 GRAM TOTAL) BY MOUTH 3 TIMES DAILY.   lamoTRIgine (LAMICTAL) 200 MG tablet Take 1 tablet (200 mg total) by mouth 2 (two) times daily.   LORazepam (ATIVAN) 0.5 MG tablet Take 1 tablet (0.5 mg total) by mouth 2 (two) times daily as needed for anxiety.   melatonin 5 MG TABS Take 5 mg by mouth at bedtime.   naloxone (NARCAN) nasal spray 4 mg/0.1 mL Place into the nose once.   ondansetron (ZOFRAN) 4 MG tablet Take 1 tablet (4 mg total) by mouth every 8 (eight) hours as needed for nausea or vomiting.   Oxycodone HCl 10 MG TABS Take 5-10 mg by mouth 3 (three) times daily as needed (severe pain).   pantoprazole (PROTONIX) 40 MG tablet Take 1 tablet (40 mg total) by mouth daily before breakfast.   potassium chloride SA (KLOR-CON M) 20 MEQ tablet Take 1 tablet (20 mEq total) by mouth daily.   sacubitril-valsartan (ENTRESTO) 24-26 MG Take 1 tablet by mouth 2 (two) times daily.   vitamin B-12 (CYANOCOBALAMIN) 250 MCG tablet Take 250 mcg by mouth daily.   No facility-administered encounter  medications on file as of 04/03/2021.     Objective: Blood pressure (!) 146/68, pulse 78, temperature 97.9 F (36.6 C), temperature source Oral, height 5' 4"  (1.626 m), weight 177 lb 3.2 oz (80.4 kg). Patient is alert and in no acute distress. She has hearing impairment. She does not have asterixis. Conjunctiva is pink. Sclera is nonicteric Oropharyngeal mucosa is normal. No neck masses or thyromegaly noted. Cardiac exam with regular rhythm normal S1 and S2. No murmur or gallop noted. Lungs are clear to auscultation. Abdomen is symmetrical with scar in right upper quadrant.   On palpation abdomen is soft and nontender with organomegaly or masses. No LE edema or clubbing noted.  Labs/studies Results:   CBC Latest Ref Rng & Units 12/21/2020 09/18/2020 06/27/2020  WBC 3.4 - 10.8 x10E3/uL 7.5 9.1 13.6(H)  Hemoglobin 11.1 - 15.9 g/dL 14.1 13.7 13.4  Hematocrit 34.0 - 46.6 % 42.1 41.6 39.0  Platelets 150 - 450 x10E3/uL 186 180 245    CMP Latest Ref Rng & Units 03/05/2021 12/21/2020 09/18/2020  Glucose 70 - 99 mg/dL 151(H) 164(H) 85  BUN 8 - 23 mg/dL 9 9 8   Creatinine 0.44 - 1.00 mg/dL 0.91 0.89 0.86  Sodium 135 - 145 mmol/L 136 144 143  Potassium 3.5 - 5.1 mmol/L 3.6 4.3 4.5  Chloride 98 - 111 mmol/L 103 103 102  CO2 22 - 32 mmol/L 26 26 26   Calcium 8.9 - 10.3 mg/dL 8.6(L) 9.3 9.5  Total Protein 6.0 - 8.5 g/dL - 7.3 7.1  Total Bilirubin 0.0 - 1.2 mg/dL - 0.4 0.4  Alkaline Phos 44 - 121 IU/L - 112 96  AST 0 - 40 IU/L - 26 29  ALT 0 - 32 IU/L - 19 15    Hepatic Function Latest Ref Rng & Units 12/21/2020 09/18/2020 06/27/2020  Total Protein 6.0 - 8.5 g/dL 7.3 7.1 7.4  Albumin 3.8 - 4.8 g/dL 4.8 4.6 4.2  AST 0 - 40 IU/L 26 29 26   ALT 0 - 32 IU/L 19 15 18   Alk Phosphatase 44 - 121 IU/L 112 96 146(H)  Total Bilirubin 0.0 - 1.2 mg/dL 0.4 0.4 0.4  Bilirubin, Direct <=0.2 mg/dL - - -    Alpha-fetoprotein was 3.5 on 10/30/2020  Abdominal ultrasound on 11/08/2020 Increased echogenicity with nodular contour to the liver consistent with cirrhosis.  No focal abnormalities noted. No ascites or splenomegaly. Normal liver Doppler.  Assessment:  #1.  Cirrhosis secondary to NASH complicated by hepatic encephalopathy.  Her hepatic function remains well-preserved. MELD score has remained low.  It was 6 based on blood work from 5 months ago.  She will be due for [facility] screening.  EGD last month revealed small esophageal varices.  She may consider follow-up EGD in 1 year.  #2.  Hepatic encephalopathy.  She is doing well with lactulose.  #3.  Chronic GERD.  She is doing well with  therapy.  Plan:  We will schedule patient abdominal ultrasound and Doppler study. Check alpha-fetoprotein and INR. New prescription given for lactulose and pantoprazole. Office visit in 6 months.

## 2021-04-03 NOTE — Patient Instructions (Signed)
Physician will call with results of blood work and ultrasound when completed.

## 2021-04-16 ENCOUNTER — Ambulatory Visit (HOSPITAL_COMMUNITY)
Admission: RE | Admit: 2021-04-16 | Discharge: 2021-04-16 | Disposition: A | Discharge status: Home / Self Care | Payer: Medicare HMO | Source: Ambulatory Visit | Attending: Internal Medicine | Admitting: Internal Medicine

## 2021-04-16 ENCOUNTER — Other Ambulatory Visit: Payer: Self-pay

## 2021-04-16 DIAGNOSIS — I81 Portal vein thrombosis: Secondary | ICD-10-CM | POA: Diagnosis not present

## 2021-04-16 DIAGNOSIS — K7460 Unspecified cirrhosis of liver: Secondary | ICD-10-CM

## 2021-04-16 DIAGNOSIS — K7689 Other specified diseases of liver: Secondary | ICD-10-CM | POA: Diagnosis not present

## 2021-04-20 ENCOUNTER — Ambulatory Visit (INDEPENDENT_AMBULATORY_CARE_PROVIDER_SITE_OTHER): Payer: Medicare HMO | Admitting: Family Medicine

## 2021-04-20 ENCOUNTER — Encounter: Payer: Self-pay | Admitting: Family Medicine

## 2021-04-20 VITALS — BP 131/79 | HR 78 | Ht 64.0 in | Wt 174.0 lb

## 2021-04-20 DIAGNOSIS — H9193 Unspecified hearing loss, bilateral: Secondary | ICD-10-CM | POA: Diagnosis not present

## 2021-04-20 NOTE — Progress Notes (Signed)
? ?BP 131/79   Pulse 78   Ht _0  (1.626 m)   Wt 174 lb (78.9 kg)   SpO2 97%   BMI 29.87 kg/m?   ? ?Subjective:  ? ?[name], female    DOB: 09/18/1950, 71 y.o.   MRN: [id number] ? ?HPI: ?Carol Richmond is a 71 y.o. female presenting on 04/20/2021 for Ear Fullness (Can hear heart beat. Had hearing loss) ? ? ?HPI ?Patient has symptoms of feeling heartbeat in her left ear.  She also has a sometimes in her right ear and sometimes it can be pulsating so loud that she has trouble hearing.  She says yesterday was a day that was worse.  Today it is better.  She says it comes and goes but she had a couple times in the past week like this.  She denies any actual pain but just feels more of a throbbing and pulsating in her ear.  She denies any drainage or fevers or chills.  She does have history of ear surgeries on the left side and does see an ENT doctor for that.  She does have some hearing loss but that has been more gradual and consistent ? ?Relevant past medical, surgical, family and social history reviewed and updated as indicated. Interim medical history since our last visit reviewed. ?Allergies and medications reviewed and updated. ? ?Review of Systems  ?Constitutional:  Negative for chills and fever.  ?HENT:  Positive for hearing loss. Negative for congestion, ear discharge, ear pain, sinus pressure, sneezing and sore throat.   ?Eyes:  Negative for redness and visual disturbance.  ?Respiratory:  Negative for chest tightness and shortness of breath.   ?Cardiovascular:  Negative for chest pain and leg swelling.  ?Genitourinary:  Negative for difficulty urinating and dysuria.  ?Musculoskeletal:  Negative for back pain and gait problem.  ?Skin:  Negative for rash.  ?Neurological:  Negative for light-headedness and headaches.  ?Psychiatric/Behavioral:  Negative for agitation and behavioral problems.   ?All other systems reviewed and are negative. ? ?Per HPI unless specifically indicated  above ? ? ?Allergies as of 04/20/2021   ?No Known Allergies ?  ? ?  ?Medication List  ?  ? ?  ? Accurate as of April 20, 2021 11:01 AM. If you have any questions, ask your nurse or doctor.  ?  ?  ? ?  ? ?acetaminophen 500 MG tablet ?Commonly known as: TYLENOL ?Take 500 mg by mouth every 6 (six) hours as needed for mild pain. ?  ?albuterol 108 (90 Base) MCG/ACT inhaler ?Commonly known as: VENTOLIN HFA ?Inhale 2 puffs into the lungs every 4 (four) hours as needed for shortness of breath. ?  ?aspirin EC 81 MG tablet ?Take 1 tablet (81 mg total) by mouth daily. ?  ?baclofen 10 MG tablet ?Commonly known as: LIORESAL ?Take 1 tablet (10 mg total) by mouth 3 (three) times daily. ?  ?benzonatate 100 MG capsule ?Commonly known as: TESSALON ?Take 1 capsule (100 mg total) by mouth 2 (two) times daily as needed for cough. ?  ?carvedilol 3.125 MG tablet ?Commonly known as: COREG ?Take 1 tablet (3.125 mg total) by mouth 2 (two) times daily with a meal. ?  ?cholecalciferol 1000 units tablet ?Commonly known as: VITAMIN D ?Take 1,000 Units by mouth daily. ?  ?DULoxetine 60 MG capsule ?Commonly known as: CYMBALTA ?Take 1 capsule (60 mg total) by mouth daily. ?  ?Entresto 24-26 MG ?Generic drug: sacubitril-valsartan ?Take 1 tablet by mouth  2 (two) times daily. ?  ?fluticasone 44 MCG/ACT inhaler ?Commonly known as: FLOVENT HFA ?Inhale 2 puffs into the lungs 2 (two) times daily. ?  ?fluticasone 50 MCG/ACT nasal spray ?Commonly known as: FLONASE ?Place 2 sprays into both nostrils daily. ?  ?furosemide 40 MG tablet ?Commonly known as: LASIX ?Take 1 tablet (40 mg total) by mouth daily. ?  ?gabapentin 300 MG capsule ?Commonly known as: NEURONTIN ?Take 300 mg by mouth 3 (three) times daily as needed (pain). ?  ?lactulose 10 GM/15ML solution ?Commonly known as: [place] ?TAKE 30 MLS (20 GRAM TOTAL) BY MOUTH 3 TIMES DAILY. ?  ?lamoTRIgine 200 MG tablet ?Commonly known as: LAMICTAL ?Take 1 tablet (200 mg total) by mouth 2 (two) times daily. ?   ?LORazepam 0.5 MG tablet ?Commonly known as: ATIVAN ?Take 1 tablet (0.5 mg total) by mouth 2 (two) times daily as needed for anxiety. ?  ?melatonin 5 MG Tabs ?Take 5 mg by mouth at bedtime. ?  ?naloxone 4 MG/0.1ML Liqd nasal spray kit ?Commonly known as: NARCAN ?Place into the nose once. ?  ?ondansetron 4 MG tablet ?Commonly known as: ZOFRAN ?Take 1 tablet (4 mg total) by mouth every 8 (eight) hours as needed for nausea or vomiting. ?  ?Oxycodone HCl 10 MG Tabs ?Take 5-10 mg by mouth 3 (three) times daily as needed (severe pain). ?  ?pantoprazole 40 MG tablet ?Commonly known as: PROTONIX ?Take 1 tablet (40 mg total) by mouth daily before breakfast. ?  ?potassium chloride SA 20 MEQ tablet ?Commonly known as: KLOR-CON M ?Take 1 tablet (20 mEq total) by mouth daily. ?  ?vitamin B-12 250 MCG tablet ?Commonly known as: CYANOCOBALAMIN ?Take 250 mcg by mouth daily. ?  ? ?  ? ? ? ?Objective:  ? ?BP 131/79   Pulse 78   Ht _0  (1.626 m)   Wt 174 lb (78.9 kg)   SpO2 97%   BMI 29.87 kg/m?   ?Wt Readings from Last 3 Encounters:  ?04/20/21 174 lb (78.9 kg)  ?04/03/21 177 lb 3.2 oz (80.4 kg)  ?03/12/21 175 lb (79.4 kg)  ?  ?Physical Exam ?Vitals and nursing note reviewed.  ?Constitutional:   ?   Appearance: Normal appearance.  ?HENT:  ?   Right Ear: Tympanic membrane and ear canal normal.  ?   Left Ear: Tympanic membrane normal.  ?   Ears:  ?   Comments: Left ear canal is larger and you can see some postsurgical changes but looks clean and no signs of infection. ?Neurological:  ?   Mental Status: She is alert.  ? ? ?Assessment & Plan:  ? ?Problem List Items Addressed This Visit   ?None ?Visit Diagnoses   ? ? Bilateral change in hearing    -  Primary  ? ?  ?  ?She sees ENT, likely the pulsating this was due to feeling her heart rate.  Told her to check her blood pressure when it is up.  Also told her that she could use mineral oil drops in her ear to help soothe it and follow-up with ENT. ? ? ?Follow up plan: ?Return if  symptoms worsen or fail to improve. ? ?Counseling provided for all of the vaccine components ?No orders of the defined types were placed in this encounter. ? ? ?Caryl Pina, MD ?[place] ?04/20/2021, 11:01 AM ? ? ?  ?

## 2021-04-26 DIAGNOSIS — H35033 Hypertensive retinopathy, bilateral: Secondary | ICD-10-CM | POA: Diagnosis not present

## 2021-04-30 ENCOUNTER — Other Ambulatory Visit (INDEPENDENT_AMBULATORY_CARE_PROVIDER_SITE_OTHER): Payer: Self-pay | Admitting: *Deleted

## 2021-04-30 DIAGNOSIS — K7460 Unspecified cirrhosis of liver: Secondary | ICD-10-CM

## 2021-05-01 DIAGNOSIS — K7460 Unspecified cirrhosis of liver: Secondary | ICD-10-CM | POA: Diagnosis not present

## 2021-05-03 ENCOUNTER — Telehealth: Payer: Self-pay | Admitting: Family Medicine

## 2021-05-03 LAB — BASIC METABOLIC PANEL
BUN: 14 mg/dL (ref 7–25)
CO2: 25 mmol/L (ref 20–32)
Calcium: 9 mg/dL (ref 8.6–10.4)
Chloride: 106 mmol/L (ref 98–110)
Creat: 0.81 mg/dL (ref 0.50–1.05)
Glucose, Bld: 166 mg/dL — ABNORMAL HIGH (ref 65–99)
Potassium: 3.7 mmol/L (ref 3.5–5.3)
Sodium: 143 mmol/L (ref 135–146)

## 2021-05-03 LAB — PROTIME-INR
INR: 1
Prothrombin Time: 10.5 s (ref 9.0–11.5)

## 2021-05-03 LAB — AFP TUMOR MARKER: AFP-Tumor Marker: 3.3 ng/mL

## 2021-05-03 MED ORDER — CARVEDILOL 3.125 MG PO TABS: 3.1250 mg | ORAL_TABLET | Freq: Two times a day (BID) | ORAL | 0 refills | Status: DC

## 2021-05-03 NOTE — Telephone Encounter (Signed)
?  Prescription Request ? ?05/03/2021 ? ?Is this a "Controlled Substance" medicine? NO ? ?Have you seen your PCP in the last 2 weeks? NO, APPT ON 3/28  ? ?If YES, route message to pool  -  If NO, patient needs to be scheduled for appointment. ? ?What is the name of the medication or equipment? carvedilol (COREG) 3.125 MG tablet ? ?Have you contacted your pharmacy to request a refill? YES   ? ?Which pharmacy would you like this sent to? [facility] ? ? ?Patient notified that their request is being sent to the clinical staff for review and that they should receive a response within 2 business days.  ?  ?

## 2021-05-03 NOTE — Telephone Encounter (Signed)
LMOVM refill sent to pharmacy 

## 2021-05-07 ENCOUNTER — Encounter: Payer: Self-pay | Admitting: Family Medicine

## 2021-05-07 ENCOUNTER — Telehealth: Payer: Self-pay | Admitting: Family Medicine

## 2021-05-07 ENCOUNTER — Ambulatory Visit (INDEPENDENT_AMBULATORY_CARE_PROVIDER_SITE_OTHER): Payer: Medicare HMO | Admitting: Family Medicine

## 2021-05-07 DIAGNOSIS — R490 Dysphonia: Secondary | ICD-10-CM | POA: Diagnosis not present

## 2021-05-07 MED ORDER — AZITHROMYCIN 250 MG PO TABS: ORAL_TABLET | ORAL | 0 refills | Status: DC

## 2021-05-07 MED ORDER — METHYLPREDNISOLONE 4 MG PO TABS: ORAL_TABLET | ORAL | 0 refills | Status: DC

## 2021-05-07 NOTE — Progress Notes (Signed)
? ? ?Subjective:  ? ? [name], female    DOB: Nov 24, 1950, 71 y.o.   MRN: [id number] ? ? ?HPI: ?Carol Richmond is a 71 y.o. female presenting for onset yesterday of hoarseness. Coughing yellow sputum. No fever. Denies dyspnea, wheezing.  ? ? ?Depression screen [facility] 2/9 04/20/2021 03/12/2021 01/17/2021 01/03/2021 12/21/2020  ?Decreased Interest 0 0 0 0 0  ?Down, Depressed, Hopeless 0 0 0 0 0  ?PHQ - 2 Score 0 0 0 0 0  ?Altered sleeping 0 - 0 - 0  ?Tired, decreased energy 0 - 0 - 0  ?Change in appetite 0 - 0 - 0  ?Feeling bad or failure about yourself  0 - 0 - 0  ?Trouble concentrating 0 - 0 - 0  ?Moving slowly or fidgety/restless 0 - 0 - 0  ?Suicidal thoughts 0 - 0 - 0  ?PHQ-9 Score - - 0 - 0  ?Difficult doing work/chores - - Not difficult at all - Not difficult at all  ?Some recent data might be hidden  ? ? ? ?Relevant past medical, surgical, family and social history reviewed and updated as indicated.  ?Interim medical history since our last visit reviewed. ?Allergies and medications reviewed and updated. ? ?ROS:  ?Review of Systems  ?Constitutional:  Negative for appetite change, chills, diaphoresis, fatigue and fever.  ?HENT:  Positive for congestion and sore throat. Negative for ear pain, hearing loss, postnasal drip, rhinorrhea and trouble swallowing.   ?Respiratory:  Positive for cough. Negative for chest tightness and shortness of breath.   ?Cardiovascular:  Negative for chest pain.  ?Gastrointestinal:  Negative for abdominal pain.   ? ?Social History  ? ?Tobacco Use  ?Smoking Status Never  ?Smokeless Tobacco Never  ? ? ?   ?Objective:  ?  ? ?Wt Readings from Last 3 Encounters:  ?04/20/21 174 lb (78.9 kg)  ?04/03/21 177 lb 3.2 oz (80.4 kg)  ?03/12/21 175 lb (79.4 kg)  ?  ? ?Exam deferred. Pt. [place] due to COVID 19. Phone visit performed.  ? ?Assessment & Plan:  ? ?1. Hoarseness of voice   ? ? ?Meds ordered this encounter  ?Medications  ? methylPREDNISolone (MEDROL) 4 MG tablet  ?   Sig: 4 daily for 2 days, then 3 daily for 2 days, then 2 daily for 2 days, then 1 daily for 2 days  ?  Dispense:  20 tablet  ?  Refill:  0  ? azithromycin (ZITHROMAX Z-PAK) 250 MG tablet  ?  Sig: Take two right away Then one a day for the next 4 days.  ?  Dispense:  6 each  ?  Refill:  0  ? ? ?No orders of the defined types were placed in this encounter. ? ?  ? ?Diagnoses and all orders for this visit: ? ?Hoarseness of voice ? ?Other orders ?-     methylPREDNISolone (MEDROL) 4 MG tablet; 4 daily for 2 days, then 3 daily for 2 days, then 2 daily for 2 days, then 1 daily for 2 days ?-     azithromycin (ZITHROMAX Z-PAK) 250 MG tablet; Take two right away Then one a day for the next 4 days. ? ? ? ?Virtual Visit via telephone Note ? ?I discussed the limitations, risks, security and privacy concerns of performing an evaluation and management service by telephone and the availability of in person appointments. The patient was identified with two identifiers. Pt.expressed understanding and agreed to proceed. Pt. Is at home. Dr.  Sherril Shipman is in his office. ? ?Follow Up Instructions: ?  ?I discussed the assessment and treatment plan with the patient. The patient was provided an opportunity to ask questions and all were answered. The patient agreed with the plan and demonstrated an understanding of the instructions. ?  ?The patient was advised to call back or seek an in-person evaluation if the symptoms worsen or if the condition fails to improve as anticipated. ? ? ?Total minutes including chart review and phone contact time: 12 ? ? ?Follow up plan: ?Return if symptoms worsen or fail to improve. ? ?Claretta Fraise, MD ?[place] ? ?

## 2021-05-15 ENCOUNTER — Encounter: Payer: Self-pay | Admitting: Family Medicine

## 2021-05-15 ENCOUNTER — Ambulatory Visit (INDEPENDENT_AMBULATORY_CARE_PROVIDER_SITE_OTHER): Payer: Medicare HMO | Admitting: Family Medicine

## 2021-05-15 VITALS — BP 114/62 | HR 62 | Temp 97.7°F | Ht 64.0 in | Wt 176.2 lb

## 2021-05-15 DIAGNOSIS — E669 Obesity, unspecified: Secondary | ICD-10-CM

## 2021-05-15 DIAGNOSIS — I10 Essential (primary) hypertension: Secondary | ICD-10-CM

## 2021-05-15 DIAGNOSIS — I70211 Atherosclerosis of native arteries of extremities with intermittent claudication, right leg: Secondary | ICD-10-CM

## 2021-05-15 DIAGNOSIS — K7460 Unspecified cirrhosis of liver: Secondary | ICD-10-CM | POA: Diagnosis not present

## 2021-05-15 DIAGNOSIS — F339 Major depressive disorder, recurrent, unspecified: Secondary | ICD-10-CM

## 2021-05-15 DIAGNOSIS — R7303 Prediabetes: Secondary | ICD-10-CM | POA: Diagnosis not present

## 2021-05-15 DIAGNOSIS — F411 Generalized anxiety disorder: Secondary | ICD-10-CM | POA: Diagnosis not present

## 2021-05-15 DIAGNOSIS — K219 Gastro-esophageal reflux disease without esophagitis: Secondary | ICD-10-CM | POA: Diagnosis not present

## 2021-05-15 DIAGNOSIS — E66811 Obesity, class 1: Secondary | ICD-10-CM

## 2021-05-15 DIAGNOSIS — F1320 Sedative, hypnotic or anxiolytic dependence, uncomplicated: Secondary | ICD-10-CM | POA: Diagnosis not present

## 2021-05-15 DIAGNOSIS — Z79899 Other long term (current) drug therapy: Secondary | ICD-10-CM | POA: Diagnosis not present

## 2021-05-15 LAB — BAYER DCA HB A1C WAIVED: HB A1C (BAYER DCA - WAIVED): 6.4 % — ABNORMAL HIGH (ref 4.8–5.6)

## 2021-05-15 MED ORDER — LORAZEPAM 0.5 MG PO TABS: 0.5000 mg | ORAL_TABLET | Freq: Two times a day (BID) | ORAL | 5 refills | Status: DC | PRN

## 2021-05-15 MED ORDER — FUROSEMIDE 40 MG PO TABS: 40.0000 mg | ORAL_TABLET | Freq: Every day | ORAL | 1 refills | Status: DC

## 2021-05-15 NOTE — Progress Notes (Signed)
? ?Established Patient Office Visit ? ?Subjective:  ?[name], female    DOB: 06-28-1950  Age: 70 y.o. MRN: [id number] ? ?CC:  ?Chief Complaint  ?Patient presents with  ? Medical Management of Chronic Issues  ? ? ?HPI ?Carol Richmond presents for follow up. She reports doing well. She saw GI last month. Her hepatic function is well-preserved. EGD last month showed small esophageal varies. They plan to repeat an EGD in 1 year. They also plan to schedule an abdominal US and doppler.  ? ?She reports that her children want her to stop taking her ativan. She reports that she has episodes during the day where she feels very nervous and shaking all of a sudden. She takes an ativan and feels better. She has been taking this for years and was initially prescribed by her previous PCP. She is taking cymbalta as prescribed.  ? ?She has started going to the gym again and trying to walk more. She is active in her church.  ? ?She continues to follow with cardiology.  ? ? ?  05/15/2021  ? 11:34 AM 04/20/2021  ? 10:20 AM 04/20/2021  ? 10:19 AM  ?Depression screen PHQ 2/9  ?Decreased Interest 0  0  ?Down, Depressed, Hopeless 0  0  ?PHQ - 2 Score 0  0  ?Altered sleeping 0 0   ?Tired, decreased energy 0 0   ?Change in appetite 0 0   ?Feeling bad or failure about yourself  0 0   ?Trouble concentrating 0 0   ?Moving slowly or fidgety/restless 0 0   ?Suicidal thoughts 0 0   ?PHQ-9 Score 0    ?Difficult doing work/chores Not difficult at all    ? ? ?  05/15/2021  ? 11:34 AM 04/20/2021  ? 10:19 AM 01/17/2021  ? 12:58 PM 12/21/2020  ? 11:35 AM  ?GAD 7 : Generalized Anxiety Score  ?Nervous, Anxious, on Edge 0 0 0 0  ?Control/stop worrying 0 0 0 0  ?Worry too much - different things 0 0 0 0  ?Trouble relaxing 0 0 0 0  ?Restless 0 0 0 0  ?Easily annoyed or irritable 0 0 0 0  ?Afraid - awful might happen 0 0 0 0  ?Total GAD 7 Score 0 0 0 0  ?Anxiety Difficulty Not difficult at all  Not difficult at all Not difficult at all   ? ? ? ?Past Medical History:  ?Diagnosis Date  ? Asthma   ? Carotid artery disease ([place])   ? Diverticulosis of colon (without mention of hemorrhage)   ? Essential hypertension   ? Functional ovarian cysts   ? GERD (gastroesophageal reflux disease)   ? Hemorrhoids, external, without mention of complication   ? Hormone replacement therapy (postmenopausal)   ? IBS (irritable bowel syndrome)   ? Migraine   ? Mitral valve disease   ? Right knee DJD   ? Seizures ([place])   ? Sleep apnea   ? TIA (transient ischemic attack)   ? Type 2 diabetes mellitus ([place])   ? Type 2 diabetes mellitus with hyperglycemia, without long-term current use of insulin ([place]) 08/07/2017  ? ? ?Past Surgical History:  ?Procedure Laterality Date  ? BIOPSY  09/23/2018  ? Procedure: BIOPSY;  Surgeon: Rogene Houston, MD;  [facility];  Service: Endoscopy;;  duodeneum and stomach  ? CHOLECYSTECTOMY  1980  ? ENDARTERECTOMY Right 07/01/2017  ? Procedure: ENDARTERECTOMY CAROTID RIGHT;  Surgeon: Serafina Mitchell,  MD;  Location: [place];  Service: Vascular;  Laterality: Right;  ? ESOPHAGOGASTRODUODENOSCOPY N/A 09/23/2018  ? Procedure: ESOPHAGOGASTRODUODENOSCOPY (EGD);  Surgeon: Rogene Houston, MD;  [facility];  Service: Endoscopy;  Laterality: N/A;  2:45-moved to 1:45 per Lelon Frohlich  ? ESOPHAGOGASTRODUODENOSCOPY (EGD) WITH PROPOFOL N/A 03/07/2021  ? Procedure: ESOPHAGOGASTRODUODENOSCOPY (EGD) WITH PROPOFOL;  Surgeon: Rogene Houston, MD;  [facility];  Service: Endoscopy;  Laterality: N/A;  12:50  ? LEFT HEART CATHETERIZATION WITH CORONARY ANGIOGRAM N/A 11/18/2013  ? Procedure: LEFT HEART CATHETERIZATION WITH CORONARY ANGIOGRAM;  Surgeon: Burnell Blanks, MD;  Location: [facility] C[facility];  Service: Cardiovascular;  Laterality: N/A;  ? OOPHORECTOMY    ? 2012 Dr. Adah Perl in [place]  ? PATCH ANGIOPLASTY Right 07/01/2017  ? Procedure: RIGHT CAROTID PATCH ANGIOPLASTY;  Surgeon: Serafina Mitchell, MD;  Location: [facility];  Service: Vascular;   Laterality: Right;  ? TONSILLECTOMY    ? TOTAL KNEE ARTHROPLASTY Right 09/02/2012  ? Procedure: TOTAL KNEE ARTHROPLASTY- right;  Surgeon: Ninetta Lights, MD;  Location: [place];  Service: Orthopedics;  Laterality: Right;  ? TUBAL LIGATION    ? TYMPANOMASTOIDECTOMY Left 04/02/2016  ? Procedure: LEFT CANAL WALL DOWN TYMPANOMASTOIDECTOMY;  Surgeon: Leta Baptist, MD;  Location: [place];  Service: ENT;  Laterality: Left;  ? [place]  ? WOUND EXPLORATION Right 07/02/2017  ? Procedure: RIGHT NECK EXPLORATION WITH INTRAOPERATIVE ULTRASOUND;  Surgeon: Angelia Mould, MD;  Location: [place];  Service: Vascular;  Laterality: Right;  ? ? ?Family History  ?Problem Relation Age of Onset  ? Heart disease Mother   ? Diabetes Mother   ? Heart disease Brother   ? Cancer Sister   ? Heart disease Brother   ? Heart disease Brother   ? Heart disease Son   ? ? ?Social History  ? ?Socioeconomic History  ? Marital status: Widowed  ?  Spouse name: Not on file  ? Number of children: 3  ? Years of education: Not on file  ? Highest education level: Not on file  ?Occupational History  ? Not on file  ?Tobacco Use  ? Smoking status: Never  ? Smokeless tobacco: Never  ?Vaping Use  ? Vaping Use: Never used  ?Substance and Sexual Activity  ? Alcohol use: No  ?  Alcohol/week: 0.0 standard drinks  ? Drug use: No  ? Sexual activity: Not on file  ?Other Topics Concern  ? Not on file  ?Social History Narrative  ? Lives alone. Granddaughter lives across the street, her 3 children all live nearby  ? ?Social Determinants of Health  ? ?Financial Resource Strain: Low Risk   ? Difficulty of Paying Living Expenses: Not hard at all  ?Food Insecurity: No Food Insecurity  ? Worried About Charity fundraiser in the Last Year: Never true  ? Ran Out of Food in the Last Year: Never true  ?Transportation Needs: No Transportation Needs  ? Lack of Transportation (Medical): No  ? Lack of Transportation (Non-Medical): No  ?Physical Activity:  Sufficiently Active  ? Days of Exercise per Week: 7 days  ? Minutes of Exercise per Session: 30 min  ?Stress: No Stress Concern Present  ? Feeling of Stress : Not at all  ?Social Connections: Moderately Integrated  ? Frequency of Communication with Friends and Family: More than three times a week  ? Frequency of Social Gatherings with Friends and Family: More than three times a week  ?  Attends Religious Services: More than 4 times per year  ? Active Member of Clubs or Organizations: Yes  ? Attends Archivist Meetings: More than 4 times per year  ? Marital Status: Widowed  ?Intimate Partner Violence: Not At Risk  ? Fear of Current or Ex-Partner: No  ? Emotionally Abused: No  ? Physically Abused: No  ? Sexually Abused: No  ? ? ?Outpatient Medications Prior to Visit  ?Medication Sig Dispense Refill  ? acetaminophen (TYLENOL) 500 MG tablet Take 500 mg by mouth every 6 (six) hours as needed for mild pain.    ? albuterol (VENTOLIN HFA) 108 (90 Base) MCG/ACT inhaler Inhale 2 puffs into the lungs every 4 (four) hours as needed for shortness of breath. 18 g 11  ? aspirin EC 81 MG tablet Take 1 tablet (81 mg total) by mouth daily.    ? baclofen (LIORESAL) 10 MG tablet Take 1 tablet (10 mg total) by mouth 3 (three) times daily. 30 each 2  ? carvedilol (COREG) 3.125 MG tablet Take 1 tablet (3.125 mg total) by mouth 2 (two) times daily with a meal. 180 tablet 0  ? cholecalciferol (VITAMIN D) 1000 UNITS tablet Take 1,000 Units by mouth daily.    ? DULoxetine (CYMBALTA) 60 MG capsule Take 1 capsule (60 mg total) by mouth daily. 30 capsule 2  ? fluticasone (FLONASE) 50 MCG/ACT nasal spray Place 2 sprays into both nostrils daily. 16 g 6  ? fluticasone (FLOVENT HFA) 44 MCG/ACT inhaler Inhale 2 puffs into the lungs 2 (two) times daily. 1 each 12  ? furosemide (LASIX) 40 MG tablet Take 1 tablet (40 mg total) by mouth daily. 90 tablet 1  ? gabapentin (NEURONTIN) 300 MG capsule Take 300 mg by mouth 3 (three) times daily as  needed (pain).    ? lactulose (CHRONULAC) 10 GM/15ML solution TAKE 30 MLS (20 GRAM TOTAL) BY MOUTH 3 TIMES DAILY. 2700 mL 2  ? lamoTRIgine (LAMICTAL) 200 MG tablet Take 1 tablet (200 mg total) by mouth 2 (two) ti

## 2021-05-15 NOTE — Patient Instructions (Signed)
Generalized Anxiety Disorder, Adult °Generalized anxiety disorder (GAD) is a mental health condition. Unlike normal worries, anxiety related to GAD is not triggered by a specific event. These worries do not fade or get better with time. GAD interferes with relationships, work, and school. °GAD symptoms can vary from mild to severe. People with severe GAD can have intense waves of anxiety with physical symptoms that are similar to panic attacks. °What are the causes? °The exact cause of GAD is not known, but the following are believed to have an impact: °Differences in natural brain chemicals. °Genes passed down from parents to children. °Differences in the way threats are perceived. °Development and stress during childhood. °Personality. °What increases the risk? °The following factors may make you more likely to develop this condition: °Being female. °Having a family history of anxiety disorders. °Being very shy. °Experiencing very stressful life events, such as the death of a loved one. °Having a very stressful family environment. °What are the signs or symptoms? °People with GAD often worry excessively about many things in their lives, such as their health and family. Symptoms may also include: °Mental and emotional symptoms: °Worrying excessively about natural disasters. °Fear of being late. °Difficulty concentrating. °Fears that others are judging your performance. °Physical symptoms: °Fatigue. °Headaches, muscle tension, muscle twitches, trembling, or feeling shaky. °Feeling like your heart is pounding or beating very fast. °Feeling out of breath or like you cannot take a deep breath. °Having trouble falling asleep or staying asleep, or experiencing restlessness. °Sweating. °Nausea, diarrhea, or irritable bowel syndrome (IBS). °Behavioral symptoms: °Experiencing erratic moods or irritability. °Avoidance of new situations. °Avoidance of people. °Extreme difficulty making decisions. °How is this diagnosed? °This  condition is diagnosed based on your symptoms and medical history. You will also have a physical exam. Your health care provider may perform tests to rule out other possible causes of your symptoms. °To be diagnosed with GAD, a person must have anxiety that: °Is out of his or her control. °Affects several different aspects of his or her life, such as work and relationships. °Causes distress that makes him or her unable to take part in normal activities. °Includes at least three symptoms of GAD, such as restlessness, fatigue, trouble concentrating, irritability, muscle tension, or sleep problems. °Before your health care provider can confirm a diagnosis of GAD, these symptoms must be present more days than they are not, and they must last for 6 months or longer. °How is this treated? °This condition may be treated with: °Medicine. Antidepressant medicine is usually prescribed for long-term daily control. Anti-anxiety medicines may be added in severe cases, especially when panic attacks occur. °Talk therapy (psychotherapy). Certain types of talk therapy can be helpful in treating GAD by providing support, education, and guidance. Options include: °Cognitive behavioral therapy (CBT). People learn coping skills and self-calming techniques to ease their physical symptoms. They learn to identify unrealistic thoughts and behaviors and to replace them with more appropriate thoughts and behaviors. °Acceptance and commitment therapy (ACT). This treatment teaches people how to be mindful as a way to cope with unwanted thoughts and feelings. °Biofeedback. This process trains you to manage your body's response (physiological response) through breathing techniques and relaxation methods. You will work with a therapist while machines are used to monitor your physical symptoms. °Stress management techniques. These include yoga, meditation, and exercise. °A mental health specialist can help determine which treatment is best for you.  Some people see improvement with one type of therapy. However, other people require   a combination of therapies. °Follow these instructions at home: °Lifestyle °Maintain a consistent routine and schedule. °Anticipate stressful situations. Create a plan and allow extra time to work with your plan. °Practice stress management or self-calming techniques that you have learned from your therapist or your health care provider. °Exercise regularly and spend time outdoors. °Eat a healthy diet that includes plenty of vegetables, fruits, whole grains, low-fat dairy products, and lean protein. °Do not eat a lot of foods that are high in fat, added sugar, or salt (sodium). °Drink plenty of water. °Avoid alcohol. Alcohol can increase anxiety. °Avoid caffeine and certain over-the-counter cold medicines. These may make you feel worse. Ask your pharmacist which medicines to avoid. °General instructions °Take over-the-counter and prescription medicines only as told by your health care provider. °Understand that you are likely to have setbacks. Accept this and be kind to yourself as you persist to take better care of yourself. °Anticipate stressful situations. Create a plan and allow extra time to work with your plan. °Recognize and accept your accomplishments, even if you judge them as small. °Spend time with people who care about you. °Keep all follow-up visits. This is important. °Where to find more information °National Institute of Mental Health: www.nimh.nih.gov °Substance Abuse and Mental Health Services: www.samhsa.gov °Contact a health care provider if: °Your symptoms do not get better. °Your symptoms get worse. °You have signs of depression, such as: °A persistently sad or irritable mood. °Loss of enjoyment in activities that used to bring you joy. °Change in weight or eating. °Changes in sleeping habits. °Get help right away if: °You have thoughts about hurting yourself or others. °If you ever feel like you may hurt  yourself or others, or have thoughts about taking your own life, get help right away. Go to your nearest emergency department or: °Call your local emergency services (911 in the [place] °Call a suicide crisis helpline, such as the National Suicide Prevention Lifeline at 1-800-273-8255 or 988 in the [place] This is open 24 hours a day in the [place] °Text the Crisis Text Line at 741741 (in the [place] °Summary °Generalized anxiety disorder (GAD) is a mental health condition that involves worry that is not triggered by a specific event. °People with GAD often worry excessively about many things in their lives, such as their health and family. °GAD may cause symptoms such as restlessness, trouble concentrating, sleep problems, frequent sweating, nausea, diarrhea, headaches, and trembling or muscle twitching. °A mental health specialist can help determine which treatment is best for you. Some people see improvement with one type of therapy. However, other people require a combination of therapies. °This information is not intended to replace advice given to you by your health care provider. Make sure you discuss any questions you have with your health care provider. °Document Revised: 08/30/2020 Document Reviewed: 05/28/2020 °Elsevier Patient Education © 2022 [employer]vier Inc. ° °

## 2021-05-16 LAB — CBC WITH DIFFERENTIAL/PLATELET
Basophils Absolute: 0 10*3/uL (ref 0.0–0.2)
Basos: 0 %
EOS (ABSOLUTE): 0.1 10*3/uL (ref 0.0–0.4)
Eos: 1 %
Hematocrit: 39.4 % (ref 34.0–46.6)
Hemoglobin: 13.4 g/dL (ref 11.1–15.9)
Immature Grans (Abs): 0.1 10*3/uL (ref 0.0–0.1)
Immature Granulocytes: 1 %
Lymphocytes Absolute: 2.3 10*3/uL (ref 0.7–3.1)
Lymphs: 19 %
MCH: 29.3 pg (ref 26.6–33.0)
MCHC: 34 g/dL (ref 31.5–35.7)
MCV: 86 fL (ref 79–97)
Monocytes Absolute: 1.3 10*3/uL — ABNORMAL HIGH (ref 0.1–0.9)
Monocytes: 11 %
Neutrophils Absolute: 8 10*3/uL — ABNORMAL HIGH (ref 1.4–7.0)
Neutrophils: 68 %
Platelets: 200 10*3/uL (ref 150–450)
RBC: 4.57 x10E6/uL (ref 3.77–5.28)
RDW: 13.2 % (ref 11.7–15.4)
WBC: 11.9 10*3/uL — ABNORMAL HIGH (ref 3.4–10.8)

## 2021-05-16 LAB — LIPID PANEL
Chol/HDL Ratio: 2.8 ratio (ref 0.0–4.4)
Cholesterol, Total: 201 mg/dL — ABNORMAL HIGH (ref 100–199)
HDL: 72 mg/dL (ref >39)
LDL Chol Calc (NIH): 106 mg/dL — ABNORMAL HIGH (ref 0–99)
Triglycerides: 131 mg/dL (ref 0–149)
VLDL Cholesterol Cal: 23 mg/dL (ref 5–40)

## 2021-05-16 LAB — CMP14+EGFR
ALT: 16 IU/L (ref 0–32)
AST: 24 IU/L (ref 0–40)
Albumin/Globulin Ratio: 1.7 (ref 1.2–2.2)
Albumin: 4.3 g/dL (ref 3.8–4.8)
Alkaline Phosphatase: 116 IU/L (ref 44–121)
BUN/Creatinine Ratio: 16 (ref 12–28)
BUN: 14 mg/dL (ref 8–27)
Bilirubin Total: 0.3 mg/dL (ref 0.0–1.2)
CO2: 28 mmol/L (ref 20–29)
Calcium: 9.1 mg/dL (ref 8.7–10.3)
Chloride: 96 mmol/L (ref 96–106)
Creatinine, Ser: 0.87 mg/dL (ref 0.57–1.00)
Globulin, Total: 2.5 g/dL (ref 1.5–4.5)
Glucose: 96 mg/dL (ref 70–99)
Potassium: 3.4 mmol/L — ABNORMAL LOW (ref 3.5–5.2)
Sodium: 137 mmol/L (ref 134–144)
Total Protein: 6.8 g/dL (ref 6.0–8.5)
eGFR: 73 mL/min/{1.73_m2} (ref >59)

## 2021-05-16 LAB — TSH: TSH: 1.35 u[IU]/mL (ref 0.450–4.500)

## 2021-05-17 ENCOUNTER — Other Ambulatory Visit: Payer: Self-pay | Admitting: Family Medicine

## 2021-05-17 ENCOUNTER — Telehealth: Payer: Self-pay | Admitting: Family Medicine

## 2021-05-17 DIAGNOSIS — I6521 Occlusion and stenosis of right carotid artery: Secondary | ICD-10-CM

## 2021-05-17 DIAGNOSIS — I10 Essential (primary) hypertension: Secondary | ICD-10-CM

## 2021-05-17 DIAGNOSIS — R7303 Prediabetes: Secondary | ICD-10-CM

## 2021-05-18 NOTE — Progress Notes (Signed)
Patient returning call. Please call back

## 2021-05-20 LAB — TOXASSURE SELECT 13 (MW), URINE

## 2021-05-21 ENCOUNTER — Telehealth: Payer: Self-pay

## 2021-05-21 ENCOUNTER — Telehealth (INDEPENDENT_AMBULATORY_CARE_PROVIDER_SITE_OTHER): Payer: Self-pay | Admitting: *Deleted

## 2021-05-21 ENCOUNTER — Telehealth: Payer: Self-pay | Admitting: Cardiology

## 2021-05-21 ENCOUNTER — Other Ambulatory Visit (INDEPENDENT_AMBULATORY_CARE_PROVIDER_SITE_OTHER): Payer: Self-pay | Admitting: *Deleted

## 2021-05-21 MED ORDER — ENTRESTO 24-26 MG PO TABS: 1.0000 | ORAL_TABLET | Freq: Two times a day (BID) | ORAL | 3 refills | Status: DC

## 2021-05-21 MED ORDER — RECOMBIVAX HB 10 MCG/ML IJ SUSP: 1.0000 mL | Freq: Once | INTRAMUSCULAR | 0 refills | Status: AC

## 2021-05-21 NOTE — Chronic Care Management (AMB) (Signed)
?  Care Management  ? ?Note ? ?05/21/2021 ?Name: [name] MRN: [id number] DOB: Apr 15, 1950 ? ?Carol Richmond is a 71 y.o. year old female who is a primary care patient of Gwenlyn Perking, FNP. I reached out to [name] by phone today offer care coordination services.  ? ?Ms. Ackerley was given information about care management services today including:  ?Care management services include personalized support from designated clinical staff supervised by her physician, including individualized plan of care and coordination with other care providers ?24/7 contact phone numbers for assistance for urgent and routine care needs. ?The patient may stop care management services at any time by phone call to the office staff. ? ?Patient did not agree to enrollment in care management services and does not wish to consider at this time. ? ?Follow up plan: ?Patient declines further follow up and engagement by the care management team. Appropriate care team members and provider have been notified via electronic communication.  ? ?Noreene Larsson, RMA ?Care Guide, Embedded Care Coordination ?[facility]  ?[place], [place] [address] ?Direct Dial: [phone] ?[email or web address] ?Website: [email or web address]  ? ?

## 2021-05-21 NOTE — Telephone Encounter (Signed)
Called patient and she uses [facility] in [place]. Rx sent for hb injection with a note to pharm to send key so PA could be worked on.  ?

## 2021-05-21 NOTE — Telephone Encounter (Signed)
Need mail order refill of Entresto 24-26 called in to [facility] by [employer]. ?

## 2021-05-21 NOTE — Telephone Encounter (Signed)
Completed.

## 2021-05-21 NOTE — Telephone Encounter (Signed)
Patient asking for prior auth to get 3rd hep b vaccine. We have done this in the past for her.  ?[phone] ?

## 2021-05-22 NOTE — Telephone Encounter (Signed)
Patient aware of lab results.

## 2021-05-23 NOTE — Telephone Encounter (Signed)
Called [facility] [place] and spoke with pharmacist and she ran through hep b injection and told me it went through without needing pa. Pt was notified.  ?

## 2021-05-24 ENCOUNTER — Ambulatory Visit (INDEPENDENT_AMBULATORY_CARE_PROVIDER_SITE_OTHER): Payer: Medicare HMO | Admitting: Family Medicine

## 2021-05-24 ENCOUNTER — Encounter: Payer: Self-pay | Admitting: Family Medicine

## 2021-05-24 ENCOUNTER — Ambulatory Visit: Payer: Medicare HMO | Admitting: Family Medicine

## 2021-05-24 DIAGNOSIS — R0981 Nasal congestion: Secondary | ICD-10-CM | POA: Diagnosis not present

## 2021-05-24 MED ORDER — LEVOCETIRIZINE DIHYDROCHLORIDE 5 MG PO TABS: 5.0000 mg | ORAL_TABLET | Freq: Every evening | ORAL | 1 refills | Status: DC

## 2021-05-24 NOTE — Progress Notes (Signed)
? ?  Virtual Visit  Note ?Due to COVID-19 pandemic this visit was conducted virtually. This visit type was conducted due to national recommendations for restrictions regarding the COVID-19 Pandemic (e.g. social distancing, sheltering in place) in an effort to limit this patient's exposure and mitigate transmission in our community. All issues noted in this document were discussed and addressed.  A physical exam was not performed with this format. ? ?I connected with Carol Richmond on 05/24/21 at 1106 by telephone and verified that I am speaking with the correct person using two identifiers. Carol Richmond is currently located at [facility] and no one is currently with her during the visit. The provider, Gwenlyn Perking, FNP is located in their office at time of visit. ? ?I discussed the limitations, risks, security and privacy concerns of performing an evaluation and management service by telephone and the availability of in person appointments. I also discussed with the patient that there may be a patient responsible charge related to this service. The patient expressed understanding and agreed to proceed. ? ?CC: congestion ? ?History and Present Illness: ? ?HPI ?Carol Richmond reports head congestion x 2-3 days. She denies fever, sore throat, chills, ear pain, cough, shortness of breath, wheezing, or chest pain. She used flonase yesterday but has not tried any other symptoms.  ? ? ? ?ROS ?As per HPI.  ? ? ?Observations/Objective: ?Carol Richmond was seen today for nasal congestion. ? ? ?Assessment and Plan: ?Diagnoses and all orders for this visit: ? ?Head congestion ?Xyzal as below. Continue flonase. Return to office for new or worsening symptoms, or if symptoms persist.  ?-     levocetirizine (XYZAL) 5 MG tablet; Take 1 tablet (5 mg total) by mouth every evening. ? ?Follow Up Instructions: ?As needed.  ? ?  ?I discussed the assessment and treatment plan with the patient. The patient was provided an opportunity to ask  questions and all were answered. The patient agreed with the plan and demonstrated an understanding of the instructions. ?  ?The patient was advised to call back or seek an in-person evaluation if the symptoms worsen or if the condition fails to improve as anticipated. ? ?The above assessment and management plan was discussed with the patient. The patient verbalized understanding of and has agreed to the management plan. Patient is aware to call the clinic if symptoms persist or worsen. Patient is aware when to return to the clinic for a follow-up visit. Patient educated on when it is appropriate to go to the emergency department.  ? ?Time call ended:  1118 ? ?I provided 12 minutes of  non face-to-face time during this encounter. ? ? ? ?Gwenlyn Perking, FNP ? ? ?

## 2021-05-28 ENCOUNTER — Telehealth: Payer: Self-pay | Admitting: Family Medicine

## 2021-05-28 NOTE — Telephone Encounter (Signed)
Lmtcb ? ?Need more information  ?

## 2021-05-29 ENCOUNTER — Ambulatory Visit (INDEPENDENT_AMBULATORY_CARE_PROVIDER_SITE_OTHER): Payer: Medicare HMO | Admitting: Family Medicine

## 2021-05-29 ENCOUNTER — Encounter: Payer: Self-pay | Admitting: Family Medicine

## 2021-05-29 VITALS — BP 123/69 | HR 70 | Ht 64.0 in | Wt 175.0 lb

## 2021-05-29 DIAGNOSIS — F339 Major depressive disorder, recurrent, unspecified: Secondary | ICD-10-CM | POA: Diagnosis not present

## 2021-05-29 DIAGNOSIS — J0140 Acute pansinusitis, unspecified: Secondary | ICD-10-CM | POA: Diagnosis not present

## 2021-05-29 DIAGNOSIS — F411 Generalized anxiety disorder: Secondary | ICD-10-CM

## 2021-05-29 MED ORDER — AMOXICILLIN 875 MG PO TABS: 875.0000 mg | ORAL_TABLET | Freq: Two times a day (BID) | ORAL | 0 refills | Status: AC

## 2021-05-29 MED ORDER — METHYLPREDNISOLONE ACETATE 40 MG/ML IJ SUSP
40.0000 mg | Freq: Once | INTRAMUSCULAR | Status: AC
  Administered 2021-05-29: 40 mg via INTRAMUSCULAR

## 2021-05-29 NOTE — Patient Instructions (Signed)

## 2021-05-29 NOTE — Progress Notes (Signed)
? ?Acute Office Visit ? ?Subjective:  ? ? [name], female    DOB: October 11, 1950, 71 y.o.   MRN: [id number] ? ?Chief Complaint  ?Patient presents with  ? Ear Fullness  ?  Present x weeks. Hearing impaired. Xyzal not helping  ? ? ?HPI ?Patient is in today for ear fullness x 1-2 weeks. She also reports cough and congestion. Cough is productive. She has been taking xyzal and mucinex without improvement. She has been using flonase intermittently. She denies fever, chills, body aches, chest pain, or shortness of breath.  ? ?She will be meeting with her granddaughter today to talk. She hopes that this will go well and improve her mood. She is taking cymbalta. Her sister mentioned zoloft but Zariyah does not desire a change at this time.  ? ? ?  05/29/2021  ?  8:46 AM 05/15/2021  ? 11:34 AM 04/20/2021  ? 10:20 AM  ?Depression screen PHQ 2/9  ?Decreased Interest 0 0   ?Down, Depressed, Hopeless 0 0   ?PHQ - 2 Score 0 0   ?Altered sleeping  0 0  ?Tired, decreased energy  0 0  ?Change in appetite  0 0  ?Feeling bad or failure about yourself   0 0  ?Trouble concentrating  0 0  ?Moving slowly or fidgety/restless  0 0  ?Suicidal thoughts  0 0  ?PHQ-9 Score  0   ?Difficult doing work/chores  Not difficult at all   ? ? ?  05/15/2021  ? 11:34 AM 04/20/2021  ? 10:19 AM 01/17/2021  ? 12:58 PM 12/21/2020  ? 11:35 AM  ?GAD 7 : Generalized Anxiety Score  ?Nervous, Anxious, on Edge 0 0 0 0  ?Control/stop worrying 0 0 0 0  ?Worry too much - different things 0 0 0 0  ?Trouble relaxing 0 0 0 0  ?Restless 0 0 0 0  ?Easily annoyed or irritable 0 0 0 0  ?Afraid - awful might happen 0 0 0 0  ?Total GAD 7 Score 0 0 0 0  ?Anxiety Difficulty Not difficult at all  Not difficult at all Not difficult at all  ? ? ? ? ?Past Medical History:  ?Diagnosis Date  ? Asthma   ? Carotid artery disease ([place])   ? Diverticulosis of colon (without mention of hemorrhage)   ? Essential hypertension   ? Functional ovarian cysts   ? GERD (gastroesophageal  reflux disease)   ? Hemorrhoids, external, without mention of complication   ? Hormone replacement therapy (postmenopausal)   ? IBS (irritable bowel syndrome)   ? Migraine   ? Mitral valve disease   ? Right knee DJD   ? Seizures ([place])   ? Sleep apnea   ? TIA (transient ischemic attack)   ? Type 2 diabetes mellitus ([place])   ? Type 2 diabetes mellitus with hyperglycemia, without long-term current use of insulin ([place]) 08/07/2017  ? ? ?Past Surgical History:  ?Procedure Laterality Date  ? BIOPSY  09/23/2018  ? Procedure: BIOPSY;  Surgeon: Rogene Houston, MD;  [facility];  Service: Endoscopy;;  duodeneum and stomach  ? CHOLECYSTECTOMY  1980  ? ENDARTERECTOMY Right 07/01/2017  ? Procedure: ENDARTERECTOMY CAROTID RIGHT;  Surgeon: Serafina Mitchell, MD;  Location: [facility] OR;  Service: Vascular;  Laterality: Right;  ? ESOPHAGOGASTRODUODENOSCOPY N/A 09/23/2018  ? Procedure: ESOPHAGOGASTRODUODENOSCOPY (EGD);  Surgeon: Rogene Houston, MD;  [facility];  Service: Endoscopy;  Laterality: N/A;  2:45-moved to 1:45  per Lelon Frohlich  ? ESOPHAGOGASTRODUODENOSCOPY (EGD) WITH PROPOFOL N/A 03/07/2021  ? Procedure: ESOPHAGOGASTRODUODENOSCOPY (EGD) WITH PROPOFOL;  Surgeon: Rogene Houston, MD;  [facility];  Service: Endoscopy;  Laterality: N/A;  12:50  ? LEFT HEART CATHETERIZATION WITH CORONARY ANGIOGRAM N/A 11/18/2013  ? Procedure: LEFT HEART CATHETERIZATION WITH CORONARY ANGIOGRAM;  Surgeon: Burnell Blanks, MD;  Location: [facility] CATH LAB;  Service: Cardiovascular;  Laterality: N/A;  ? OOPHORECTOMY    ? 2012 Dr. Adah Perl in [place]  ? PATCH ANGIOPLASTY Right 07/01/2017  ? Procedure: RIGHT CAROTID PATCH ANGIOPLASTY;  Surgeon: Serafina Mitchell, MD;  Location: [facility] OR;  Service: Vascular;  Laterality: Right;  ? TONSILLECTOMY    ? TOTAL KNEE ARTHROPLASTY Right 09/02/2012  ? Procedure: TOTAL KNEE ARTHROPLASTY- right;  Surgeon: Ninetta Lights, MD;  Location: [place];  Service: Orthopedics;  Laterality: Right;  ? TUBAL LIGATION     ? TYMPANOMASTOIDECTOMY Left 04/02/2016  ? Procedure: LEFT CANAL WALL DOWN TYMPANOMASTOIDECTOMY;  Surgeon: Leta Baptist, MD;  Location: [place];  Service: ENT;  Laterality: Left;  ? [place]  ? WOUND EXPLORATION Right 07/02/2017  ? Procedure: RIGHT NECK EXPLORATION WITH INTRAOPERATIVE ULTRASOUND;  Surgeon: Angelia Mould, MD;  Location: [place];  Service: Vascular;  Laterality: Right;  ? ? ?Family History  ?Problem Relation Age of Onset  ? Heart disease Mother   ? Diabetes Mother   ? Heart disease Brother   ? Cancer Sister   ? Heart disease Brother   ? Heart disease Brother   ? Heart disease Son   ? ? ?Social History  ? ?Socioeconomic History  ? Marital status: Widowed  ?  Spouse name: Not on file  ? Number of children: 3  ? Years of education: Not on file  ? Highest education level: Not on file  ?Occupational History  ? Not on file  ?Tobacco Use  ? Smoking status: Never  ? Smokeless tobacco: Never  ?Vaping Use  ? Vaping Use: Never used  ?Substance and Sexual Activity  ? Alcohol use: No  ?  Alcohol/week: 0.0 standard drinks  ? Drug use: No  ? Sexual activity: Not on file  ?Other Topics Concern  ? Not on file  ?Social History Narrative  ? Lives alone. Granddaughter lives across the street, her 3 children all live nearby  ? ?Social Determinants of Health  ? ?Financial Resource Strain: Low Risk   ? Difficulty of Paying Living Expenses: Not hard at all  ?Food Insecurity: No Food Insecurity  ? Worried About Charity fundraiser in the Last Year: Never true  ? Ran Out of Food in the Last Year: Never true  ?Transportation Needs: No Transportation Needs  ? Lack of Transportation (Medical): No  ? Lack of Transportation (Non-Medical): No  ?Physical Activity: Sufficiently Active  ? Days of Exercise per Week: 7 days  ? Minutes of Exercise per Session: 30 min  ?Stress: No Stress Concern Present  ? Feeling of Stress : Not at all  ?Social Connections: Moderately Integrated  ? Frequency of  Communication with Friends and Family: More than three times a week  ? Frequency of Social Gatherings with Friends and Family: More than three times a week  ? Attends Religious Services: More than 4 times per year  ? Active Member of Clubs or Organizations: Yes  ? Attends Archivist Meetings: More than 4 times per year  ? Marital Status: Widowed  ?Intimate Partner Violence: Not At  Risk  ? Fear of Current or Ex-Partner: No  ? Emotionally Abused: No  ? Physically Abused: No  ? Sexually Abused: No  ? ? ?Outpatient Medications Prior to Visit  ?Medication Sig Dispense Refill  ? acetaminophen (TYLENOL) 500 MG tablet Take 500 mg by mouth every 6 (six) hours as needed for mild pain.    ? albuterol (VENTOLIN HFA) 108 (90 Base) MCG/ACT inhaler Inhale 2 puffs into the lungs every 4 (four) hours as needed for shortness of breath. 18 g 11  ? aspirin EC 81 MG tablet Take 1 tablet (81 mg total) by mouth daily.    ? baclofen (LIORESAL) 10 MG tablet Take 1 tablet (10 mg total) by mouth 3 (three) times daily. 30 each 2  ? carvedilol (COREG) 3.125 MG tablet Take 1 tablet (3.125 mg total) by mouth 2 (two) times daily with a meal. 180 tablet 0  ? cholecalciferol (VITAMIN D) 1000 UNITS tablet Take 1,000 Units by mouth daily.    ? DULoxetine (CYMBALTA) 60 MG capsule Take 1 capsule (60 mg total) by mouth daily. 30 capsule 2  ? fluticasone (FLONASE) 50 MCG/ACT nasal spray Place 2 sprays into both nostrils daily. 16 g 6  ? fluticasone (FLOVENT HFA) 44 MCG/ACT inhaler Inhale 2 puffs into the lungs 2 (two) times daily. 1 each 12  ? furosemide (LASIX) 40 MG tablet Take 1 tablet (40 mg total) by mouth daily. 90 tablet 1  ? gabapentin (NEURONTIN) 300 MG capsule Take 300 mg by mouth 3 (three) times daily as needed (pain).    ? lactulose (CHRONULAC) 10 GM/15ML solution TAKE 30 MLS (20 GRAM TOTAL) BY MOUTH 3 TIMES DAILY. 2700 mL 2  ? lamoTRIgine (LAMICTAL) 200 MG tablet Take 1 tablet (200 mg total) by mouth 2 (two) times daily. 180 tablet  3  ? levocetirizine (XYZAL) 5 MG tablet Take 1 tablet (5 mg total) by mouth every evening. 30 tablet 1  ? LORazepam (ATIVAN) 0.5 MG tablet Take 1 tablet (0.5 mg total) by mouth 2 (two) times daily as needed f

## 2021-06-04 NOTE — Telephone Encounter (Signed)
No call back  ?This encounter will be closed  ?

## 2021-06-07 DIAGNOSIS — Z5181 Encounter for therapeutic drug level monitoring: Secondary | ICD-10-CM | POA: Diagnosis not present

## 2021-06-07 DIAGNOSIS — Z79899 Other long term (current) drug therapy: Secondary | ICD-10-CM | POA: Diagnosis not present

## 2021-06-07 DIAGNOSIS — M5412 Radiculopathy, cervical region: Secondary | ICD-10-CM | POA: Diagnosis not present

## 2021-06-22 ENCOUNTER — Other Ambulatory Visit: Payer: Self-pay | Admitting: Family Medicine

## 2021-06-22 DIAGNOSIS — F339 Major depressive disorder, recurrent, unspecified: Secondary | ICD-10-CM

## 2021-06-22 DIAGNOSIS — F411 Generalized anxiety disorder: Secondary | ICD-10-CM

## 2021-07-03 ENCOUNTER — Ambulatory Visit (INDEPENDENT_AMBULATORY_CARE_PROVIDER_SITE_OTHER): Payer: Medicare HMO | Admitting: Family Medicine

## 2021-07-03 ENCOUNTER — Encounter: Payer: Self-pay | Admitting: Family Medicine

## 2021-07-03 VITALS — BP 119/74 | HR 80 | Temp 97.3°F | Ht 64.0 in | Wt 177.0 lb

## 2021-07-03 DIAGNOSIS — L03116 Cellulitis of left lower limb: Secondary | ICD-10-CM | POA: Diagnosis not present

## 2021-07-03 MED ORDER — DOXYCYCLINE HYCLATE 100 MG PO TABS: 100.0000 mg | ORAL_TABLET | Freq: Two times a day (BID) | ORAL | 0 refills | Status: AC

## 2021-07-03 NOTE — Progress Notes (Signed)
?  ? ?Subjective:  ?[name], female    DOB: [birth year], 71 y.o.   MRN: [id number] ? ?Patient Care Team: ?Gwenlyn Perking, FNP as PCP - General (Family Medicine) ?Satira Sark, MD as PCP - Cardiology (Cardiology) ?Cameron Sprang, MD as Consulting Physician (Neurology) ?Rogene Houston, MD as Consulting Physician (Gastroenterology) ?Leticia Clas, OD (Optometry) ?Angelia Mould, MD as Consulting Physician (Vascular Surgery)  ? ?Chief Complaint:  Insect Bite ([place]?) ? ? ?HPI: ?Carol Richmond is a 71 y.o. female presenting on 07/03/2021 for Insect Bite ([place]?) ? ? ?Pt presents today for evaluation of a wound to her left ankle. States she was sitting on the porch on Wednesday and noticed a black spot to her left ankle, states she picked the scab off. Since this time the wound has become larger and now has surrounding erythema. No known injury or bite. Denies fever, chills, weakness, fatigue, or confusion. Has been washing the wound and applying neosporin daily without relief of symptoms.  ? ? ?Relevant past medical, surgical, family, and social history reviewed and updated as indicated.  ?Allergies and medications reviewed and updated. Data reviewed: Chart in Epic. ? ? ?Past Medical History:  ?Diagnosis Date  ? Asthma   ? Carotid artery disease ([place])   ? Diverticulosis of colon (without mention of hemorrhage)   ? Essential hypertension   ? Functional ovarian cysts   ? GERD (gastroesophageal reflux disease)   ? Hemorrhoids, external, without mention of complication   ? Hormone replacement therapy (postmenopausal)   ? IBS (irritable bowel syndrome)   ? Migraine   ? Mitral valve disease   ? Right knee DJD   ? Seizures ([place])   ? Sleep apnea   ? TIA (transient ischemic attack)   ? Type 2 diabetes mellitus ([place])   ? Type 2 diabetes mellitus with hyperglycemia, without long-term current use of insulin ([place]) 08/07/2017  ? ? ?Past Surgical History:  ?Procedure Laterality Date  ?  BIOPSY  09/23/2018  ? Procedure: BIOPSY;  Surgeon: Rogene Houston, MD;  [facility];  Service: Endoscopy;;  duodeneum and stomach  ? CHOLECYSTECTOMY  1980  ? ENDARTERECTOMY Right 07/01/2017  ? Procedure: ENDARTERECTOMY CAROTID RIGHT;  Surgeon: Serafina Mitchell, MD;  Location: [facility] OR;  Service: Vascular;  Laterality: Right;  ? ESOPHAGOGASTRODUODENOSCOPY N/A 09/23/2018  ? Procedure: ESOPHAGOGASTRODUODENOSCOPY (EGD);  Surgeon: Rogene Houston, MD;  [facility];  Service: Endoscopy;  Laterality: N/A;  2:45-moved to 1:45 per Lelon Frohlich  ? ESOPHAGOGASTRODUODENOSCOPY (EGD) WITH PROPOFOL N/A 03/07/2021  ? Procedure: ESOPHAGOGASTRODUODENOSCOPY (EGD) WITH PROPOFOL;  Surgeon: Rogene Houston, MD;  [facility];  Service: Endoscopy;  Laterality: N/A;  12:50  ? LEFT HEART CATHETERIZATION WITH CORONARY ANGIOGRAM N/A 11/18/2013  ? Procedure: LEFT HEART CATHETERIZATION WITH CORONARY ANGIOGRAM;  Surgeon: Burnell Blanks, MD;  Location: [facility] CATH LAB;  Service: Cardiovascular;  Laterality: N/A;  ? OOPHORECTOMY    ? 2012 Dr. Adah Perl in [place]  ? PATCH ANGIOPLASTY Right 07/01/2017  ? Procedure: RIGHT CAROTID PATCH ANGIOPLASTY;  Surgeon: Serafina Mitchell, MD;  Location: [facility] OR;  Service: Vascular;  Laterality: Right;  ? TONSILLECTOMY    ? TOTAL KNEE ARTHROPLASTY Right 09/02/2012  ? Procedure: TOTAL KNEE ARTHROPLASTY- right;  Surgeon: Ninetta Lights, MD;  Location: [place];  Service: Orthopedics;  Laterality: Right;  ? TUBAL LIGATION    ? TYMPANOMASTOIDECTOMY Left 04/02/2016  ? Procedure: LEFT CANAL WALL DOWN TYMPANOMASTOIDECTOMY;  Surgeon: Leta Baptist, MD;  Location: [place];  Service: ENT;  Laterality: Left;  ? [place]  ? WOUND EXPLORATION Right 07/02/2017  ? Procedure: RIGHT NECK EXPLORATION WITH INTRAOPERATIVE ULTRASOUND;  Surgeon: Angelia Mould, MD;  Location: [place];  Service: Vascular;  Laterality: Right;  ? ? ?Social History  ? ?Socioeconomic History  ? Marital  status: Widowed  ?  Spouse name: Not on file  ? Number of children: 3  ? Years of education: Not on file  ? Highest education level: Not on file  ?Occupational History  ? Not on file  ?Tobacco Use  ? Smoking status: Never  ? Smokeless tobacco: Never  ?Vaping Use  ? Vaping Use: Never used  ?Substance and Sexual Activity  ? Alcohol use: No  ?  Alcohol/week: 0.0 standard drinks  ? Drug use: No  ? Sexual activity: Not on file  ?Other Topics Concern  ? Not on file  ?Social History Narrative  ? Lives alone. Granddaughter lives across the street, her 3 children all live nearby  ? ?Social Determinants of Health  ? ?Financial Resource Strain: Low Risk   ? Difficulty of Paying Living Expenses: Not hard at all  ?Food Insecurity: No Food Insecurity  ? Worried About Charity fundraiser in the Last Year: Never true  ? Ran Out of Food in the Last Year: Never true  ?Transportation Needs: No Transportation Needs  ? Lack of Transportation (Medical): No  ? Lack of Transportation (Non-Medical): No  ?Physical Activity: Sufficiently Active  ? Days of Exercise per Week: 7 days  ? Minutes of Exercise per Session: 30 min  ?Stress: No Stress Concern Present  ? Feeling of Stress : Not at all  ?Social Connections: Moderately Integrated  ? Frequency of Communication with Friends and Family: More than three times a week  ? Frequency of Social Gatherings with Friends and Family: More than three times a week  ? Attends Religious Services: More than 4 times per year  ? Active Member of Clubs or Organizations: Yes  ? Attends Archivist Meetings: More than 4 times per year  ? Marital Status: Widowed  ?Intimate Partner Violence: Not At Risk  ? Fear of Current or Ex-Partner: No  ? Emotionally Abused: No  ? Physically Abused: No  ? Sexually Abused: No  ? ? ?Outpatient Encounter Medications as of 07/03/2021  ?Medication Sig  ? acetaminophen (TYLENOL) 500 MG tablet Take 500 mg by mouth every 6 (six) hours as needed for mild pain.  ? albuterol  (VENTOLIN HFA) 108 (90 Base) MCG/ACT inhaler Inhale 2 puffs into the lungs every 4 (four) hours as needed for shortness of breath.  ? aspirin EC 81 MG tablet Take 1 tablet (81 mg total) by mouth daily.  ? baclofen (LIORESAL) 10 MG tablet Take 1 tablet (10 mg total) by mouth 3 (three) times daily.  ? carvedilol (COREG) 3.125 MG tablet Take 1 tablet (3.125 mg total) by mouth 2 (two) times daily with a meal.  ? cholecalciferol (VITAMIN D) 1000 UNITS tablet Take 1,000 Units by mouth daily.  ? doxycycline (VIBRA-TABS) 100 MG tablet Take 1 tablet (100 mg total) by mouth 2 (two) times daily for 10 days. 1 po bid  ? DULoxetine (CYMBALTA) 60 MG capsule TAKE 1 CAPSULE BY MOUTH ONCE DAILY.  ? fluticasone (FLONASE) 50 MCG/ACT nasal spray Place 2 sprays into both nostrils daily.  ? fluticasone (FLOVENT HFA) 44 MCG/ACT inhaler Inhale 2 puffs  into the lungs 2 (two) times daily.  ? furosemide (LASIX) 40 MG tablet Take 1 tablet (40 mg total) by mouth daily.  ? gabapentin (NEURONTIN) 300 MG capsule Take 300 mg by mouth 3 (three) times daily as needed (pain).  ? lactulose (CHRONULAC) 10 GM/15ML solution TAKE 30 MLS (20 GRAM TOTAL) BY MOUTH 3 TIMES DAILY.  ? lamoTRIgine (LAMICTAL) 200 MG tablet Take 1 tablet (200 mg total) by mouth 2 (two) times daily.  ? levocetirizine (XYZAL) 5 MG tablet Take 1 tablet (5 mg total) by mouth every evening.  ? LORazepam (ATIVAN) 0.5 MG tablet Take 1 tablet (0.5 mg total) by mouth 2 (two) times daily as needed for anxiety.  ? melatonin 5 MG TABS Take 5 mg by mouth at bedtime.  ? naloxone (NARCAN) nasal spray 4 mg/0.1 mL Place into the nose once.  ? ondansetron (ZOFRAN) 4 MG tablet Take 1 tablet (4 mg total) by mouth every 8 (eight) hours as needed for nausea or vomiting.  ? Oxycodone HCl 10 MG TABS Take 5-10 mg by mouth 3 (three) times daily as needed (severe pain).  ? pantoprazole (PROTONIX) 40 MG tablet Take 1 tablet (40 mg total) by mouth daily before breakfast.  ? potassium chloride SA (KLOR-CON M)  20 MEQ tablet Take 1 tablet (20 mEq total) by mouth daily.  ? sacubitril-valsartan (ENTRESTO) 24-26 MG Take 1 tablet by mouth 2 (two) times daily.  ? vitamin B-12 (CYANOCOBALAMIN) 250 MCG tablet Take 250 mcg by mout

## 2021-07-05 ENCOUNTER — Ambulatory Visit (INDEPENDENT_AMBULATORY_CARE_PROVIDER_SITE_OTHER): Payer: Medicare HMO

## 2021-07-05 VITALS — Wt 177.0 lb

## 2021-07-05 DIAGNOSIS — Z973 Presence of spectacles and contact lenses: Secondary | ICD-10-CM | POA: Diagnosis not present

## 2021-07-05 DIAGNOSIS — H903 Sensorineural hearing loss, bilateral: Secondary | ICD-10-CM | POA: Diagnosis not present

## 2021-07-05 DIAGNOSIS — Z0001 Encounter for general adult medical examination with abnormal findings: Secondary | ICD-10-CM

## 2021-07-05 DIAGNOSIS — Z0000 Encounter for general adult medical examination without abnormal findings: Secondary | ICD-10-CM

## 2021-07-05 DIAGNOSIS — Z599 Problem related to housing and economic circumstances, unspecified: Secondary | ICD-10-CM

## 2021-07-05 NOTE — Progress Notes (Signed)
Subjective:   Carol Richmond is a 71 y.o. female who presents for Medicare Annual (Subsequent) preventive examination.  Review of Systems     Cardiac Risk Factors include: advanced age (>38mn, >>32women);Other (see comment);sedentary lifestyle;obesity (BMI >30kg/m2), Risk factor comments: cardiomyopathy, cirrhosis, fatty liver disease, PAD     Objective:    Today's Vitals   07/05/21 1314  Weight: 177 lb (80.3 kg)   Body mass index is 30.38 kg/m.     07/05/2021    1:29 PM 03/07/2021   12:22 PM 03/05/2021   10:37 AM 07/04/2020   12:37 PM 09/23/2018   10:51 AM 09/24/2017   11:03 AM 07/21/2017    3:59 PM  Advanced Directives  Does Patient Have a Medical Advance Directive? Yes Yes Yes Yes Yes No No  Type of [place]amed[place]of [place] will Living will Living will [place] Mar Heights[place] will Living will    Copy of [place] Chart? No - copy requested   No - copy requested     Would patient like information on creating a medical advance directive?      No - Patient declined     Current Medications (verified) Outpatient Encounter Medications as of 07/05/2021  Medication Sig   acetaminophen (TYLENOL) 500 MG tablet Take 500 mg by mouth every 6 (six) hours as needed for mild pain.   albuterol (VENTOLIN HFA) 108 (90 Base) MCG/ACT inhaler Inhale 2 puffs into the lungs every 4 (four) hours as needed for shortness of breath.   aspirin EC 81 MG tablet Take 1 tablet (81 mg total) by mouth daily.   baclofen (LIORESAL) 10 MG tablet Take 1 tablet (10 mg total) by mouth 3 (three) times daily.   carvedilol (COREG) 3.125 MG tablet Take 1 tablet (3.125 mg total) by mouth 2 (two) times daily with a meal.   cholecalciferol (VITAMIN D) 1000 UNITS tablet Take 1,000 Units by mouth daily.   doxycycline (VIBRA-TABS) 100 MG tablet Take 1 tablet (100 mg total) by mouth 2 (two) times daily for 10 days. 1 po bid   DULoxetine (CYMBALTA) 60 MG capsule  TAKE 1 CAPSULE BY MOUTH ONCE DAILY.   fluticasone (FLONASE) 50 MCG/ACT nasal spray Place 2 sprays into both nostrils daily.   fluticasone (FLOVENT HFA) 44 MCG/ACT inhaler Inhale 2 puffs into the lungs 2 (two) times daily.   furosemide (LASIX) 40 MG tablet Take 1 tablet (40 mg total) by mouth daily.   gabapentin (NEURONTIN) 300 MG capsule Take 300 mg by mouth 3 (three) times daily as needed (pain).   HYDROcodone-acetaminophen (NORCO) 10-325 MG tablet hydrocodone 10 mg-acetaminophen 325 mg tablet  Take 1 tablet 3 times a day by oral route as needed for pain for 30 days.   lactulose (CHRONULAC) 10 GM/15ML solution TAKE 30 MLS (20 GRAM TOTAL) BY MOUTH 3 TIMES DAILY.   lamoTRIgine (LAMICTAL) 200 MG tablet Take 1 tablet (200 mg total) by mouth 2 (two) times daily.   levocetirizine (XYZAL) 5 MG tablet Take 1 tablet (5 mg total) by mouth every evening.   LORazepam (ATIVAN) 0.5 MG tablet Take 1 tablet (0.5 mg total) by mouth 2 (two) times daily as needed for anxiety.   melatonin 5 MG TABS Take 5 mg by mouth at bedtime.   ondansetron (ZOFRAN) 4 MG tablet Take 1 tablet (4 mg total) by mouth every 8 (eight) hours as needed for nausea or vomiting.   pantoprazole (PROTONIX) 40 MG tablet Take 1  tablet (40 mg total) by mouth daily before breakfast.   potassium chloride SA (KLOR-CON M) 20 MEQ tablet Take 1 tablet (20 mEq total) by mouth daily.   sacubitril-valsartan (ENTRESTO) 24-26 MG Take 1 tablet by mouth 2 (two) times daily.   naloxone (NARCAN) nasal spray 4 mg/0.1 mL Place into the nose once.   [DISCONTINUED] Oxycodone HCl 10 MG TABS Take 5-10 mg by mouth 3 (three) times daily as needed (severe pain).   [DISCONTINUED] vitamin B-12 (CYANOCOBALAMIN) 250 MCG tablet Take 250 mcg by mouth daily.   No facility-administered encounter medications on file as of 07/05/2021.    Allergies (verified) Patient has no known allergies.   History: Past Medical History:  Diagnosis Date   Asthma    Carotid artery  disease ([place])    Diverticulosis of colon (without mention of hemorrhage)    Essential hypertension    Functional ovarian cysts    GERD (gastroesophageal reflux disease)    Hemorrhoids, external, without mention of complication    Hormone replacement therapy (postmenopausal)    IBS (irritable bowel syndrome)    Migraine    Mitral valve disease    Right knee DJD    Seizures ([facility])    Sleep apnea    TIA (transient ischemic attack)    Type 2 diabetes mellitus ([place])    Type 2 diabetes mellitus with hyperglycemia, without long-term current use of insulin ([place]) 08/07/2017   Past Surgical History:  Procedure Laterality Date   BIOPSY  09/23/2018   Procedure: BIOPSY;  Surgeon: Rogene Houston, MD;  [facility];  Service: Endoscopy;;  duodeneum and stomach   CHOLECYSTECTOMY  1980   ENDARTERECTOMY Right 07/01/2017   Procedure: ENDARTERECTOMY CAROTID RIGHT;  Surgeon: Serafina Mitchell, MD;  Location: [facility] OR;  Service: Vascular;  Laterality: Right;   ESOPHAGOGASTRODUODENOSCOPY N/A 09/23/2018   Procedure: ESOPHAGOGASTRODUODENOSCOPY (EGD);  Surgeon: Rogene Houston, MD;  [facility];  Service: Endoscopy;  Laterality: N/A;  2:45-moved to 1:45 per Lelon Frohlich   ESOPHAGOGASTRODUODENOSCOPY (EGD) WITH PROPOFOL N/A 03/07/2021   Procedure: ESOPHAGOGASTRODUODENOSCOPY (EGD) WITH PROPOFOL;  Surgeon: Rogene Houston, MD;  [facility];  Service: Endoscopy;  Laterality: N/A;  12:50   LEFT HEART CATHETERIZATION WITH CORONARY ANGIOGRAM N/A 11/18/2013   Procedure: LEFT HEART CATHETERIZATION WITH CORONARY ANGIOGRAM;  Surgeon: Burnell Blanks, MD;  Location[facility] CATH LAB;  Service: Cardiovascular;  Laterality: N/A;   OOPHORECTOMY     2012 Dr. Adah Perl in [place] Right 07/01/2017   Procedure: RIGHT CAROTID PATCH ANGIOPLASTY;  Surgeon: Serafina Mitchell, MD;  Location: [place];  Service: Vascular;  Laterality: Right;   TONSILLECTOMY     TOTAL KNEE ARTHROPLASTY Right 09/02/2012    Procedure: TOTAL KNEE ARTHROPLASTY- right;  Surgeon: Ninetta Lights, MD;  Location: [place];  Service: Orthopedics;  Laterality: Right;   TUBAL LIGATION     TYMPANOMASTOIDECTOMY Left 04/02/2016   Procedure: LEFT CANAL WALL DOWN TYMPANOMASTOIDECTOMY;  Surgeon: Leta Baptist, MD;  Location: [place];  Service: ENT;  Laterality: Left;   [place] Right 07/02/2017   Procedure: RIGHT NECK EXPLORATION WITH INTRAOPERATIVE ULTRASOUND;  Surgeon: Angelia Mould, MD;  Location: [facility] OR;  Service: Vascular;  Laterality: Right;   Family History  Problem Relation Age of Onset   Heart disease Mother    Diabetes Mother    Heart disease Brother    Cancer Sister    Heart disease  Brother    Heart disease Brother    Heart disease Son    Social History   Socioeconomic History   Marital status: Widowed    Spouse name: Not on file   Number of children: 3   Years of education: Not on file   Highest education level: Not on file  Occupational History   Occupation: retired  Tobacco Use   Smoking status: Never   Smokeless tobacco: Never  Vaping Use   Vaping Use: Never used  Substance and Sexual Activity   Alcohol use: No    Alcohol/week: 0.0 standard drinks   Drug use: No   Sexual activity: Not on file  Other Topics Concern   Not on file  Social History Narrative   Lives alone. Granddaughter lives across the street, her 3 children all live nearby   Social Determinants of Health   Financial Resource Strain: Medium Risk   Difficulty of Paying Living Expenses: Somewhat hard  Food Insecurity: Landscape architect Present   Worried About Charity fundraiser in the Last Year: Sometimes true   Arboriculturist in the Last Year: Never true  Transportation Needs: No Transportation Needs   Lack of Transportation (Medical): No   Lack of Transportation (Non-Medical): No  Physical Activity: Insufficiently Active   Days of Exercise per Week: 7 days   Minutes  of Exercise per Session: 20 min  Stress: No Stress Concern Present   Feeling of Stress : Not at all  Social Connections: Moderately Integrated   Frequency of Communication with Friends and Family: More than three times a week   Frequency of Social Gatherings with Friends and Family: More than three times a week   Attends Religious Services: More than 4 times per year   Active Member of [employer] or Organizations: Yes   Attends Archivist Meetings: More than 4 times per year   Marital Status: Widowed    Tobacco Counseling Counseling given: Not Answered   Clinical Intake:  Pre-visit preparation completed: Yes  Pain : No/denies pain     BMI - recorded: 30.38 Nutritional Status: BMI > 30  Obese Nutritional Risks: None Diabetes: No  How often do you need to have someone help you when you read instructions, pamphlets, or other written materials from your doctor or pharmacy?: 1 - Never  Diabetic? no  Interpreter Needed?: No  Information entered by :: Zandon Talton, LPN   Activities of Daily Living    07/05/2021    1:42 PM 03/05/2021   10:35 AM  In your present state of health, do you have any difficulty performing the following activities:  Hearing? 1 1  Vision? 0 0  Difficulty concentrating or making decisions? 1 0  Walking or climbing stairs?  0  Dressing or bathing? 0 0  Doing errands, shopping? 0   Preparing Food and eating ? N   Using the Toilet? N   In the past six months, have you accidently leaked urine? N   Do you have problems with loss of bowel control? N   Managing your Medications? N   Managing your Finances? N   Housekeeping or managing your Housekeeping? N     Patient Care Team: Gwenlyn Perking, FNP as PCP - General (Family Medicine) Satira Sark, MD as PCP - Cardiology (Cardiology) Cameron Sprang, MD as Consulting Physician (Neurology) Rogene Houston, MD as Consulting Physician (Gastroenterology) Leticia Clas, [place]  (Optometry) Angelia Mould, MD as Consulting Physician (Vascular  Surgery)  Indicate any recent Medical Services you may have received from other than [facility] providers in the past year (date may be approximate).     Assessment:   This is a routine wellness examination for Caliya.  Hearing/Vision screen Hearing Screening - Comments:: She had hearing aids - needs new ones - has appt with audiologist next week  Vision Screening - Comments:: Wears rx glasses - up to date with routine eye exams with [facility] in [place] issues and exercise activities discussed: Current Exercise Habits: Home exercise routine, Type of exercise: walking, Time (Minutes): 20, Frequency (Times/Week): 7, Weekly Exercise (Minutes/Week): 140, Intensity: Mild, Exercise limited by: Other - see comments (PAD)   Goals Addressed             This Visit's Progress    Patient Stated   Not on track    She wants to try to walk 1-2 miles each day and lose some weight       Depression Screen    07/05/2021    1:43 PM 07/03/2021    9:39 AM 05/29/2021    8:46 AM 05/15/2021   11:34 AM 04/20/2021   10:19 AM 03/12/2021   10:53 AM 01/17/2021   12:57 PM  PHQ 2/9 Scores  PHQ - 2 Score 0 0 0 0 0 0 0  PHQ- 9 Score 1 0  0   0    Fall Risk    07/05/2021    1:18 PM 07/03/2021    9:39 AM 05/29/2021    8:46 AM 05/15/2021   11:33 AM 04/20/2021   10:04 AM  Fall Risk   Falls in the past year? 0 0 0 0 0  Number falls in past yr: 0   0   Injury with Fall? 0   0   Risk for fall due to : No Fall Risks   No Fall Risks   Follow up Falls prevention discussed   Falls evaluation completed     FALL RISK PREVENTION PERTAINING TO THE HOME:  Any stairs in or around the home? Yes  If so, are there any without handrails? No  Home free of loose throw rugs in walkways, pet beds, electrical cords, etc? Yes  Adequate lighting in your home to reduce risk of falls? Yes   ASSISTIVE DEVICES UTILIZED TO PREVENT FALLS:  Life  alert? No  Use of a cane, walker or w/c? No  Grab bars in the bathroom? Yes  Shower chair or bench in shower? Yes  Elevated toilet seat or a handicapped toilet? Yes   TIMED UP AND GO:  Was the test performed? No . Telephonic visit  Cognitive Function:        07/05/2021    1:31 PM 07/04/2020   12:40 PM  6CIT Screen  What Year? 0 points 0 points  What month? 0 points 0 points  What time? 0 points 0 points  Count back from 20 0 points 0 points  Months in reverse 2 points 0 points  Repeat phrase 4 points 0 points  Total Score 6 points 0 points    Immunizations Immunization History  Administered Date(s) Administered   Fluad Quad(high Dose 65+) 11/20/2020   Influenza Split 12/02/2012   Influenza,inj,Quad PF,6+ Mos 12/08/2018   Influenza,inj,quad, With Preservative 11/28/2017, 12/05/2018   Influenza-Unspecified 01/19/2020   Moderna Sars-Covid-2 Vaccination 04/15/2019, 05/14/2019, 02/29/2020   PNEUMOCOCCAL CONJUGATE-20 12/21/2020   Tdap 12/13/2002    TDAP status: Due, Education has been provided regarding  the importance of this vaccine. Advised may receive this vaccine at local pharmacy or Health Dept. Aware to provide a copy of the vaccination record if obtained from local pharmacy or Health Dept. Verbalized acceptance and understanding.  Flu Vaccine status: Up to date  Pneumococcal vaccine status: Up to date  Covid-19 vaccine status: Completed vaccines  Qualifies for Shingles Vaccine? Yes   Zostavax completed No   Shingrix Completed?: No.    Education has been provided regarding the importance of this vaccine. Patient has been advised to call insurance company to determine out of pocket expense if they have not yet received this vaccine. Advised may also receive vaccine at local pharmacy or Health Dept. Verbalized acceptance and understanding.  Screening Tests Health Maintenance  Topic Date Due   Zoster Vaccines- Shingrix (1 of 2) Never done   TETANUS/TDAP  12/12/2012    DEXA SCAN  Never done   COVID-19 Vaccine (4 - Booster for Moderna series) 12/06/2021 (Originally 04/25/2020)   INFLUENZA VACCINE  09/18/2021   COLONOSCOPY (Pts 45-74yr Insurance coverage will need to be confirmed)  06/03/2022   MAMMOGRAM  10/31/2022   Pneumonia Vaccine 71 Years old  Completed   Hepatitis C Screening  Completed   HPV VACCINES  Aged Out    Health Maintenance  Health Maintenance Due  Topic Date Due   Zoster Vaccines- Shingrix (1 of 2) Never done   TETANUS/TDAP  12/12/2012   DEXA SCAN  Never done    Colorectal cancer screening: Type of screening: Colonoscopy. Completed 06/02/2017. Repeat every 5 years  Mammogram status: Completed 10/30/2020. Repeat every year  DEXA: due  Lung Cancer Screening: (Low Dose CT Chest recommended if Age 69-80years, 30 pack-year currently smoking OR have quit w/in 15years.) does not qualify  Additional Screening:  Hepatitis C Screening: does qualify; Completed 10/30/2020  Vision Screening: Recommended annual ophthalmology exams for early detection of glaucoma and other disorders of the eye. Is the patient up to date with their annual eye exam?  Yes  Who is the provider or what is the name of the office in which the patient attends annual eye exams? [facility] [place] not established with a provider, would they like to be referred to a provider to establish care? No .   Dental Screening: Recommended annual dental exams for proper oral hygiene  Community Resource Referral / Chronic Care Management: CRR required this visit?  Yes   CCM required this visit?  No      Plan:     I have personally reviewed and noted the following in the patient's chart:   Medical and social history Use of alcohol, tobacco or illicit drugs  Current medications and supplements including opioid prescriptions.  Functional ability and status Nutritional status Physical activity Advanced directives List of other physicians Hospitalizations,  surgeries, and ER visits in previous 12 months Vitals Screenings to include cognitive, depression, and falls Referrals and appointments  In addition, I have reviewed and discussed with patient certain preventive protocols, quality metrics, and best practice recommendations. A written personalized care plan for preventive services as well as general preventive health recommendations were provided to patient.     ASandrea Hammond LPN   50/30/0923  Nurse Notes: Sent CRR to see we can assist with her getting hearing aids and rx glasses

## 2021-07-05 NOTE — Patient Instructions (Signed)
Carol Richmond , Thank you for taking time to come for your Medicare Wellness Visit. I appreciate your ongoing commitment to your health goals. Please review the following plan we discussed and let me know if I can assist you in the future.   Screening recommendations/referrals: Colonoscopy: Done 06/02/2017 - Repeat in 5 years Mammogram: Done 10/30/2020 - Repeat annually Bone Density: Due* get this done at your next visit with Tiffany Recommended yearly ophthalmology/optometry visit for glaucoma screening and checkup Recommended yearly dental visit for hygiene and checkup  Vaccinations: Influenza vaccine: Done 11/20/2020 - Repeat annually Pneumococcal vaccine: Done 12/21/2020 Tdap vaccine: Done 2004 - recommend repeats every 10 years *due Shingles vaccine: Due * - Shingrix is 2 doses 2-6 months apart and over 90% effective     Covid-19:Done  04/15/2019, 05/14/2019, & 02/29/2020  Advanced directives: Please bring a copy of your health care power of attorney and living will to the office to be added to your chart at your convenience.   Conditions/risks identified: Aim for 30 minutes of exercise or brisk walking, 6-8 glasses of water, and 5 servings of fruits and vegetables each day.   Next appointment: Follow up in one year for your annual wellness visit    Preventive Care 65 Years and Older, Female Preventive care refers to lifestyle choices and visits with your health care provider that can promote health and wellness. What does preventive care include? A yearly physical exam. This is also called an annual well check. Dental exams once or twice a year. Routine eye exams. Ask your health care provider how often you should have your eyes checked. Personal lifestyle choices, including: Daily care of your teeth and gums. Regular physical activity. Eating a healthy diet. Avoiding tobacco and drug use. Limiting alcohol use. Practicing safe sex. Taking low-dose aspirin every day. Taking  vitamin and mineral supplements as recommended by your health care provider. What happens during an annual well check? The services and screenings done by your health care provider during your annual well check will depend on your age, overall health, lifestyle risk factors, and family history of disease. Counseling  Your health care provider may ask you questions about your: Alcohol use. Tobacco use. Drug use. Emotional well-being. Home and relationship well-being. Sexual activity. Eating habits. History of falls. Memory and ability to understand (cognition). Work and work Statistician. Reproductive health. Screening  You may have the following tests or measurements: Height, weight, and BMI. Blood pressure. Lipid and cholesterol levels. These may be checked every 5 years, or more frequently if you are over 20 years old. Skin check. Lung cancer screening. You may have this screening every year starting at age 17 if you have a 30-pack-year history of smoking and currently smoke or have quit within the past 15 years. Fecal occult blood test (FOBT) of the stool. You may have this test every year starting at age 28. Flexible sigmoidoscopy or colonoscopy. You may have a sigmoidoscopy every 5 years or a colonoscopy every 10 years starting at age 33. Hepatitis C blood test. Hepatitis B blood test. Sexually transmitted disease (STD) testing. Diabetes screening. This is done by checking your blood sugar (glucose) after you have not eaten for a while (fasting). You may have this done every 1-3 years. Bone density scan. This is done to screen for osteoporosis. You may have this done starting at age 65. Mammogram. This may be done every 1-2 years. Talk to your health care provider about how often you should have regular mammograms.  Talk with your health care provider about your test results, treatment options, and if necessary, the need for more tests. Vaccines  Your health care provider may  recommend certain vaccines, such as: Influenza vaccine. This is recommended every year. Tetanus, diphtheria, and acellular pertussis (Tdap, Td) vaccine. You may need a Td booster every 10 years. Zoster vaccine. You may need this after age 50. Pneumococcal 13-valent conjugate (PCV13) vaccine. One dose is recommended after age 15. Pneumococcal polysaccharide (PPSV23) vaccine. One dose is recommended after age 63. Talk to your health care provider about which screenings and vaccines you need and how often you need them. This information is not intended to replace advice given to you by your health care provider. Make sure you discuss any questions you have with your health care provider. Document Released: 03/03/2015 Document Revised: 10/25/2015 Document Reviewed: 12/06/2014 Elsevier Interactive Patient Education  2017 [employer] Prevention in the Home Falls can cause injuries. They can happen to people of all ages. There are many things you can do to make your home safe and to help prevent falls. What can I do on the outside of my home? Regularly fix the edges of walkways and driveways and fix any cracks. Remove anything that might make you trip as you walk through a door, such as a raised step or threshold. Trim any bushes or trees on the path to your home. Use bright outdoor lighting. Clear any walking paths of anything that might make someone trip, such as rocks or tools. Regularly check to see if handrails are loose or broken. Make sure that both sides of any steps have handrails. Any raised decks and porches should have guardrails on the edges. Have any leaves, snow, or ice cleared regularly. Use sand or salt on walking paths during winter. Clean up any spills in your garage right away. This includes oil or grease spills. What can I do in the bathroom? Use night lights. Install grab bars by the toilet and in the tub and shower. Do not use towel bars as grab bars. Use non-skid  mats or decals in the tub or shower. If you need to sit down in the shower, use a plastic, non-slip stool. Keep the floor dry. Clean up any water that spills on the floor as soon as it happens. Remove soap buildup in the tub or shower regularly. Attach bath mats securely with double-sided non-slip rug tape. Do not have throw rugs and other things on the floor that can make you trip. What can I do in the bedroom? Use night lights. Make sure that you have a light by your bed that is easy to reach. Do not use any sheets or blankets that are too big for your bed. They should not hang down onto the floor. Have a firm chair that has side arms. You can use this for support while you get dressed. Do not have throw rugs and other things on the floor that can make you trip. What can I do in the kitchen? Clean up any spills right away. Avoid walking on wet floors. Keep items that you use a lot in easy-to-reach places. If you need to reach something above you, use a strong step stool that has a grab bar. Keep electrical cords out of the way. Do not use floor polish or wax that makes floors slippery. If you must use wax, use non-skid floor wax. Do not have throw rugs and other things on the floor that can make  you trip. What can I do with my stairs? Do not leave any items on the stairs. Make sure that there are handrails on both sides of the stairs and use them. Fix handrails that are broken or loose. Make sure that handrails are as long as the stairways. Check any carpeting to make sure that it is firmly attached to the stairs. Fix any carpet that is loose or worn. Avoid having throw rugs at the top or bottom of the stairs. If you do have throw rugs, attach them to the floor with carpet tape. Make sure that you have a light switch at the top of the stairs and the bottom of the stairs. If you do not have them, ask someone to add them for you. What else can I do to help prevent falls? Wear shoes  that: Do not have high heels. Have rubber bottoms. Are comfortable and fit you well. Are closed at the toe. Do not wear sandals. If you use a stepladder: Make sure that it is fully opened. Do not climb a closed stepladder. Make sure that both sides of the stepladder are locked into place. Ask someone to hold it for you, if possible. Clearly mark and make sure that you can see: Any grab bars or handrails. First and last steps. Where the edge of each step is. Use tools that help you move around (mobility aids) if they are needed. These include: Canes. Walkers. Scooters. Crutches. Turn on the lights when you go into a dark area. Replace any light bulbs as soon as they burn out. Set up your furniture so you have a clear path. Avoid moving your furniture around. If any of your floors are uneven, fix them. If there are any pets around you, be aware of where they are. Review your medicines with your doctor. Some medicines can make you feel dizzy. This can increase your chance of falling. Ask your doctor what other things that you can do to help prevent falls. This information is not intended to replace advice given to you by your health care provider. Make sure you discuss any questions you have with your health care provider. Document Released: 12/01/2008 Document Revised: 07/13/2015 Document Reviewed: 03/11/2014 Elsevier Interactive Patient Education  2017 [employer].

## 2021-07-09 ENCOUNTER — Telehealth: Payer: Self-pay

## 2021-07-09 DIAGNOSIS — R070 Pain in throat: Secondary | ICD-10-CM | POA: Diagnosis not present

## 2021-07-09 DIAGNOSIS — H903 Sensorineural hearing loss, bilateral: Secondary | ICD-10-CM | POA: Diagnosis not present

## 2021-07-09 DIAGNOSIS — H95122 Granulation of postmastoidectomy cavity, left ear: Secondary | ICD-10-CM | POA: Diagnosis not present

## 2021-07-09 NOTE — Progress Notes (Signed)
Virtual Visit via Telephone Note  I connected with  Carol Richmond on 07/05/2021 at  1:15 PM EDT by telephone and verified that I am speaking with the correct person using two identifiers.  Location: Patient: Home Provider: [facility] Persons participating in the virtual visit: patient/Nurse Health Advisor   I discussed the limitations, risks, security and privacy concerns of performing an evaluation and management service by telephone and the availability of in person appointments. The patient expressed understanding and agreed to proceed.  Interactive audio and video telecommunications were attempted between this nurse and patient, however failed, due to patient having technical difficulties OR patient did not have access to video capability.  We continued and completed visit with audio only.  Some vital signs may be absent or patient reported.   Lysa Livengood Dionne Ano, LPN

## 2021-07-09 NOTE — Telephone Encounter (Signed)
   Telephone encounter was:  Unsuccessful.  07/09/2021 Name: Carol Richmond MRN: [id number] DOB: July 27, 1950  Unsuccessful outbound call made today to assist with:  Financial Difficulties related to hearing aids, eyeglasses.  Outreach Attempt:  1st Attempt  A HIPAA compliant voice message was left requesting a return call.  Instructed patient to call back at [phone]. Left messages on both home and mobile phones for patient to return my call regarding regarding community resources for hearing aids and eyeglasses.  Amayah Staheli, AAS Paralegal, [facility] [employer]  [address], [place] [address] ??[email or web address]  ?? [phone]   [email or web address]

## 2021-07-10 ENCOUNTER — Telehealth: Payer: Self-pay

## 2021-07-10 NOTE — Telephone Encounter (Signed)
   Telephone encounter was:  Successful.  07/10/2021 Name: Carol Richmond MRN: [id number] DOB: 1950-03-24  Carol Richmond is a 71 y.o. year old female who is a primary care patient of Gwenlyn Perking, FNP . The community resource team was consulted for assistance with Financial Difficulties related to eyeglasses, hearing aids  Care guide performed the following interventions:  Patient was unable to talk and requested that I call her again tomorrow .  Follow Up Plan:   I will call patient tomorrow 5/24.  Breyanna Valera, AAS Paralegal, [place] [facility]  [address], [place] [address] ??[name]  ?? [phone]   [email or web address]

## 2021-07-11 ENCOUNTER — Telehealth: Payer: Self-pay

## 2021-07-11 NOTE — Telephone Encounter (Signed)
   Telephone encounter was:  Successful.  07/11/2021 Name: [name] MRN: [id number] DOB: September 07, 1950  Carol Richmond is a 71 y.o. year old female who is a primary care patient of Gwenlyn Perking, FNP . The community resource team was consulted for assistance with Financial Difficulties related to eyeglasses  Care guide performed the following interventions: Spoke with patient about the [employer] in [place] for recycled eyeglasses.  Patient stated that she has already has already purchased hearing aids.  Follow Up Plan:  Care guide will follow up with patient by phone over the next 7 days.  Geddy Boydstun, AAS Paralegal, [facility] [employer]  [address], [place] [address] ??[email or web address]  ?? [phone]   [email or web address]

## 2021-07-16 NOTE — Progress Notes (Unsigned)
Cardiology Office Note  Date: 07/17/2021   ID: Carol Richmond 1951-01-25, MRN [id number]  PCP:  Gwenlyn Perking, FNP  Cardiologist:  Rozann Lesches, MD Electrophysiologist:  None   Chief Complaint  Patient presents with   Cardiac follow-up    History of Present Illness: Carol Richmond is a 71 y.o. female last seen in December 2022.  She is here for a follow-up visit.  Reports NYHA class II dyspnea.  He admits that she has not been very active, we did talk about a walking plan for exercise.  Still misses her husband since his passing.  She reports compliance with her medications.  Follow-up echocardiogram in December 2022 revealed improvement in LVEF to the range of 55 to 60% with mild diastolic dysfunction, rheumatic appearing mitral valve with moderate stenosis and mild regurgitation, mild to moderate left atrial enlargement.  I reviewed her current regimen as noted below.  She did have lab work in March with normal renal function.  Past Medical History:  Diagnosis Date   Asthma    Carotid artery disease ([facility])    Diverticulosis of colon (without mention of hemorrhage)    Essential hypertension    Functional ovarian cysts    GERD (gastroesophageal reflux disease)    Hemorrhoids, external, without mention of complication    Hormone replacement therapy (postmenopausal)    IBS (irritable bowel syndrome)    Migraine    Mitral valve disease    Right knee DJD    Seizures ([facility])    Sleep apnea    TIA (transient ischemic attack)    Type 2 diabetes mellitus ([facility])    Type 2 diabetes mellitus with hyperglycemia, without long-term current use of insulin ([facility]) 08/07/2017    Past Surgical History:  Procedure Laterality Date   BIOPSY  09/23/2018   Procedure: BIOPSY;  Surgeon: Rogene Houston, MD;  [facility];  Service: Endoscopy;;  duodeneum and stomach   CHOLECYSTECTOMY  1980   ENDARTERECTOMY Right 07/01/2017   Procedure: ENDARTERECTOMY CAROTID RIGHT;   Surgeon: Serafina Mitchell, MD;  Location: [facility] OR;  Service: Vascular;  Laterality: Right;   ESOPHAGOGASTRODUODENOSCOPY N/A 09/23/2018   Procedure: ESOPHAGOGASTRODUODENOSCOPY (EGD);  Surgeon: Rogene Houston, MD;  [facility];  Service: Endoscopy;  Laterality: N/A;  2:45-moved to 1:45 per Lelon Frohlich   ESOPHAGOGASTRODUODENOSCOPY (EGD) WITH PROPOFOL N/A 03/07/2021   Procedure: ESOPHAGOGASTRODUODENOSCOPY (EGD) WITH PROPOFOL;  Surgeon: Rogene Houston, MD;  [facility];  Service: Endoscopy;  Laterality: N/A;  12:50   LEFT HEART CATHETERIZATION WITH CORONARY ANGIOGRAM N/A 11/18/2013   Procedure: LEFT HEART CATHETERIZATION WITH CORONARY ANGIOGRAM;  Surgeon: Burnell Blanks, MD;  Location: [facility] CATH LAB;  Service: Cardiovascular;  Laterality: N/A;   OOPHORECTOMY     2012 Dr. Adah Perl in [place] Right 07/01/2017   Procedure: RIGHT CAROTID PATCH ANGIOPLASTY;  Surgeon: Serafina Mitchell, MD;  Location: [facility];  Service: Vascular;  Laterality: Right;   TONSILLECTOMY     TOTAL KNEE ARTHROPLASTY Right 09/02/2012   Procedure: TOTAL KNEE ARTHROPLASTY- right;  Surgeon: Ninetta Lights, MD;  Location: [facility];  Service: Orthopedics;  Laterality: Right;   TUBAL LIGATION     TYMPANOMASTOIDECTOMY Left 04/02/2016   Procedure: LEFT CANAL WALL DOWN TYMPANOMASTOIDECTOMY;  Surgeon: Leta [place], MD;  Location: [facility];  Service: ENT;  Laterality: Left;   [place] Right 07/02/2017   Procedure: RIGHT NECK  EXPLORATION WITH INTRAOPERATIVE ULTRASOUND;  Surgeon: Angelia Mould, MD;  [facility];  Service: Vascular;  Laterality: Right;    Current Outpatient Medications  Medication Sig Dispense Refill   acetaminophen (TYLENOL) 500 MG tablet Take 500 mg by mouth every 6 (six) hours as needed for mild pain.     albuterol (VENTOLIN HFA) 108 (90 Base) MCG/ACT inhaler Inhale 2 puffs into the lungs every 4 (four) hours as needed for  shortness of breath. 18 g 11   aspirin EC 81 MG tablet Take 1 tablet (81 mg total) by mouth daily.     baclofen (LIORESAL) 10 MG tablet Take 1 tablet (10 mg total) by mouth 3 (three) times daily. 30 each 2   carvedilol (COREG) 3.125 MG tablet Take 1 tablet (3.125 mg total) by mouth 2 (two) times daily with a meal. 180 tablet 0   cholecalciferol (VITAMIN D) 1000 UNITS tablet Take 1,000 Units by mouth daily.     DULoxetine (CYMBALTA) 60 MG capsule TAKE 1 CAPSULE BY MOUTH ONCE DAILY. 30 capsule 0   fluticasone (FLONASE) 50 MCG/ACT nasal spray Place 2 sprays into both nostrils daily. 16 g 6   fluticasone (FLOVENT HFA) 44 MCG/ACT inhaler Inhale 2 puffs into the lungs 2 (two) times daily. 1 each 12   furosemide (LASIX) 40 MG tablet Take 1 tablet (40 mg total) by mouth daily. 90 tablet 1   gabapentin (NEURONTIN) 300 MG capsule Take 300 mg by mouth 3 (three) times daily as needed (pain).     HYDROcodone-acetaminophen (NORCO) 10-325 MG tablet hydrocodone 10 mg-acetaminophen 325 mg tablet  Take 1 tablet 3 times a day by oral route as needed for pain for 30 days.     lactulose (CHRONULAC) 10 GM/15ML solution TAKE 30 MLS (20 GRAM TOTAL) BY MOUTH 3 TIMES DAILY. 2700 mL 2   lamoTRIgine (LAMICTAL) 200 MG tablet Take 1 tablet (200 mg total) by mouth 2 (two) times daily. 180 tablet 3   levocetirizine (XYZAL) 5 MG tablet Take 1 tablet (5 mg total) by mouth every evening. 30 tablet 1   LORazepam (ATIVAN) 0.5 MG tablet Take 1 tablet (0.5 mg total) by mouth 2 (two) times daily as needed for anxiety. 30 tablet 5   melatonin 5 MG TABS Take 5 mg by mouth at bedtime.     naloxone (NARCAN) nasal spray 4 mg/0.1 mL Place into the nose once.     ondansetron (ZOFRAN) 4 MG tablet Take 1 tablet (4 mg total) by mouth every 8 (eight) hours as needed for nausea or vomiting. 20 tablet 2   pantoprazole (PROTONIX) 40 MG tablet Take 1 tablet (40 mg total) by mouth daily before breakfast. 90 tablet 3   potassium chloride SA (KLOR-CON  M) 20 MEQ tablet Take 1 tablet (20 mEq total) by mouth daily. 90 tablet 3   sacubitril-valsartan (ENTRESTO) 24-26 MG Take 1 tablet by mouth 2 (two) times daily. 180 tablet 3   No current facility-administered medications for this visit.   Allergies:  Patient has no known allergies.   ROS:  No syncope.  Physical Exam: VS:  BP 118/68   Pulse 81   Ht 5' 4"  (1.626 m)   Wt 175 lb 3.2 oz (79.5 kg)   SpO2 95%   BMI 30.07 kg/m , BMI Body mass index is 30.07 kg/m.  Wt Readings from Last 3 Encounters:  07/17/21 175 lb 3.2 oz (79.5 kg)  07/05/21 177 lb (80.3 kg)  07/03/21 177 lb (80.3 kg)  General: Patient appears comfortable at rest. HEENT: Conjunctiva and lids normal. Neck: Supple, no elevated JVP or carotid bruits, no thyromegaly. Lungs: Clear to auscultation, nonlabored breathing at rest. Cardiac: Regular rate and rhythm, no S3, 2/6 systolic murmur. Extremities: No pitting edema.  ECG:  An ECG dated 01/23/2021 was personally reviewed today and demonstrated:  Sinus rhythm with left bundle branch block.  Recent Labwork: 05/15/2021: ALT 16; AST 24; BUN 14; Creatinine, Ser 0.87; Hemoglobin 13.4; Platelets 200; Potassium 3.4; Sodium 137; TSH 1.350     Component Value Date/Time   CHOL 201 (H) 05/15/2021 1218   TRIG 131 05/15/2021 1218   HDL 72 05/15/2021 1218   CHOLHDL 2.8 05/15/2021 1218   CHOLHDL 5.2 05/08/2007 0530   VLDL 38 05/08/2007 0530   LDLCALC 106 (H) 05/15/2021 1218    Other Studies Reviewed Today:  Echocardiogram 01/29/2021:  1. Left ventricular ejection fraction, by estimation, is 55 to 60%. The  left ventricle has normal function. Left ventricular endocardial border  not optimally defined to evaluate regional wall motion. There is mild left  ventricular hypertrophy. Left  ventricular diastolic parameters are consistent with Grade I diastolic  dysfunction (impaired relaxation). Elevated left atrial pressure.   2. Right ventricular systolic function is normal.  The right ventricular  size is normal. Tricuspid regurgitation signal is inadequate for assessing  PA pressure.   3. Left atrial size was mild to moderately dilated.   4. Mitral valve poorly visualized. Suggested diastolic doming of leaflets  suggesting possible rheumatic disease. . The mitral valve was not well  visualized. Mild mitral valve regurgitation. Moderate mitral stenosis. The  mean mitral valve gradient is 7.0   mmHg.   5. The aortic valve was not well visualized. Aortic valve regurgitation  is mild. No aortic stenosis is present.   6. The inferior vena cava is normal in size with greater than 50%  respiratory variability, suggesting right atrial pressure of 3 mmHg.   Assessment and Plan:  1.  HFrecEF with LVEF normal range at 55 to 60% by last assessment.  She is clinically stable with NYHA class II dyspnea.  I recommended a regular walking plan for exercise.  Current regimen includes Coreg, Entresto, and Lasix with potassium supplement.  2.  Rheumatic mitral valve disease with mild mitral regurgitation and moderate mitral stenosis.  Recheck echocardiogram in 6 months with clinical visit.  Medication Adjustments/Labs and Tests Ordered: Current medicines are reviewed at length with the patient today.  Concerns regarding medicines are outlined above.   Tests Ordered: Orders Placed This Encounter  Procedures   ECHOCARDIOGRAM COMPLETE    Medication Changes: No orders of the defined types were placed in this encounter.   Disposition:  Follow up  6 months.  Signed, Satira Sark, MD, [facility] 07/17/2021 10:28 AM    [place] [facility] [place], [place], [place] [address] Phone: [phone]; Fax: [phone]

## 2021-07-17 ENCOUNTER — Encounter: Payer: Self-pay | Admitting: Cardiology

## 2021-07-17 ENCOUNTER — Ambulatory Visit: Payer: Medicare HMO | Admitting: Cardiology

## 2021-07-17 VITALS — BP 118/68 | HR 81 | Ht 64.0 in | Wt 175.2 lb

## 2021-07-17 DIAGNOSIS — I050 Rheumatic mitral stenosis: Secondary | ICD-10-CM | POA: Diagnosis not present

## 2021-07-17 DIAGNOSIS — Z8679 Personal history of other diseases of the circulatory system: Secondary | ICD-10-CM | POA: Diagnosis not present

## 2021-07-17 NOTE — Patient Instructions (Addendum)

## 2021-07-18 ENCOUNTER — Encounter: Payer: Self-pay | Admitting: Family Medicine

## 2021-07-18 ENCOUNTER — Telehealth: Payer: Self-pay | Admitting: Family Medicine

## 2021-07-18 ENCOUNTER — Ambulatory Visit (INDEPENDENT_AMBULATORY_CARE_PROVIDER_SITE_OTHER): Payer: Medicare HMO | Admitting: Family Medicine

## 2021-07-18 VITALS — BP 119/74 | Temp 97.9°F | Ht 64.0 in | Wt 176.0 lb

## 2021-07-18 DIAGNOSIS — L03116 Cellulitis of left lower limb: Secondary | ICD-10-CM

## 2021-07-18 DIAGNOSIS — J301 Allergic rhinitis due to pollen: Secondary | ICD-10-CM | POA: Diagnosis not present

## 2021-07-18 DIAGNOSIS — T1490XD Injury, unspecified, subsequent encounter: Secondary | ICD-10-CM

## 2021-07-18 MED ORDER — LEVOCETIRIZINE DIHYDROCHLORIDE 5 MG PO TABS: 5.0000 mg | ORAL_TABLET | Freq: Every evening | ORAL | 1 refills | Status: DC

## 2021-07-18 MED ORDER — FLUTICASONE PROPIONATE 50 MCG/ACT NA SUSP: 2.0000 | Freq: Every day | NASAL | 6 refills | Status: DC

## 2021-07-18 NOTE — Progress Notes (Signed)
Subjective:  [name], female    DOB: 1950/12/27, 71 y.o.   MRN: [id number]  Patient Care Team: Gwenlyn Perking, FNP as PCP - General (Family Medicine) Satira Sark, MD as PCP - Cardiology (Cardiology) Cameron Sprang, MD as Consulting Physician (Neurology) Rogene Houston, MD as Consulting Physician (Gastroenterology) Leticia Clas, [place] (Optometry) Angelia Mould, MD as Consulting Physician (Vascular Surgery)   Chief Complaint:  Insect Bite   HPI: Carol Richmond is a 71 y.o. female presenting on 07/18/2021 for Insect Bite   Pt presents today for wound recheck. She was seen and treated for LLE cellulitis on 07/03/2021. She has completed full course of antibiotic therapy. States wound is healing well. Denies pain, swelling, or drainage. No fever, chills, weakness, or confusion.  She also reports ongoing rhinorrhea, congestion, and postnasal drainage with dry cough. She has not been taking anything for her symptoms. States this has been worse over the last several days. No known sick contacts.     Relevant past medical, surgical, family, and social history reviewed and updated as indicated.  Allergies and medications reviewed and updated. Data reviewed: Chart in Epic.   Past Medical History:  Diagnosis Date   Asthma    Carotid artery disease ([place])    Diverticulosis of colon (without mention of hemorrhage)    Essential hypertension    Functional ovarian cysts    GERD (gastroesophageal reflux disease)    Hemorrhoids, external, without mention of complication    Hormone replacement therapy (postmenopausal)    IBS (irritable bowel syndrome)    Migraine    Mitral valve disease    Right knee DJD    Seizures (H[facility])    Sleep apnea    TIA (transient ischemic attack)    Type 2 diabetes mellitus ([place])    Type 2 diabetes mellitus with hyperglycemia, without long-term current use of insulin ([place]) 08/07/2017    Past Surgical History:   Procedure Laterality Date   BIOPSY  09/23/2018   Procedure: BIOPSY;  Surgeon: Rogene Houston, MD;  [facility];  Service: Endoscopy;;  duodeneum and stomach   CHOLECYSTECTOMY  1980   ENDARTERECTOMY Right 07/01/2017   Procedure: ENDARTERECTOMY CAROTID RIGHT;  Surgeon: Serafina Mitchell, MD;  Location: [facility];  Service: Vascular;  Laterality: Right;   ESOPHAGOGASTRODUODENOSCOPY N/A 09/23/2018   Procedure: ESOPHAGOGASTRODUODENOSCOPY (EGD);  Surgeon: Rogene Houston, MD;  [facility];  Service: Endoscopy;  Laterality: N/A;  2:45-moved to 1:45 per Lelon Frohlich   ESOPHAGOGASTRODUODENOSCOPY (EGD) WITH PROPOFOL N/A 03/07/2021   Procedure: ESOPHAGOGASTRODUODENOSCOPY (EGD) WITH PROPOFOL;  Surgeon: Rogene Houston, MD;  [facility];  Service: Endoscopy;  Laterality: N/A;  12:50   LEFT HEART CATHETERIZATION WITH CORONARY ANGIOGRAM N/A 11/18/2013   Procedure: LEFT HEART CATHETERIZATION WITH CORONARY ANGIOGRAM;  Surgeon: Burnell Blanks, MD;  Location: [facility] CATH LAB;  Service: Cardiovascular;  Laterality: N/A;   OOPHORECTOMY     2012 Dr. Adah Perl in [place] Right 07/01/2017   Procedure: RIGHT CAROTID PATCH ANGIOPLASTY;  Surgeon: Serafina Mitchell, MD;  Location: [place];  Service: Vascular;  Laterality: Right;   TONSILLECTOMY     TOTAL KNEE ARTHROPLASTY Right 09/02/2012   Procedure: TOTAL KNEE ARTHROPLASTY- right;  Surgeon: Ninetta Lights, MD;  Location: [place];  Service: Orthopedics;  Laterality: Right;   TUBAL LIGATION     TYMPANOMASTOIDECTOMY Left 04/02/2016   Procedure: LEFT CANAL WALL DOWN TYMPANOMASTOIDECTOMY;  Surgeon: Leta Baptist, MD;  Location: [place];  Service: ENT;  Laterality: Left;   [place] Right 07/02/2017   Procedure: RIGHT NECK EXPLORATION WITH INTRAOPERATIVE ULTRASOUND;  Surgeon: Angelia Mould, MD;  Location: [place];  Service: Vascular;  Laterality: Right;    Social History    Socioeconomic History   Marital status: Widowed    Spouse name: Not on file   Number of children: 3   Years of education: Not on file   Highest education level: Not on file  Occupational History   Occupation: retired  Tobacco Use   Smoking status: Never   Smokeless tobacco: Never  Vaping Use   Vaping Use: Never used  Substance and Sexual Activity   Alcohol use: No    Alcohol/week: 0.0 standard drinks   Drug use: No   Sexual activity: Not on file  Other Topics Concern   Not on file  Social History Narrative   Lives alone. Granddaughter lives across the street, her 3 children all live nearby   Social Determinants of Health   Financial Resource Strain: Medium Risk   Difficulty of Paying Living Expenses: Somewhat hard  Food Insecurity: Landscape architect Present   Worried About Charity fundraiser in the Last Year: Sometimes true   Arboriculturist in the Last Year: Never true  Transportation Needs: No Transportation Needs   Lack of Transportation (Medical): No   Lack of Transportation (Non-Medical): No  Physical Activity: Insufficiently Active   Days of Exercise per Week: 7 days   Minutes of Exercise per Session: 20 min  Stress: No Stress Concern Present   Feeling of Stress : Not at all  Social Connections: Moderately Integrated   Frequency of Communication with Friends and Family: More than three times a week   Frequency of Social Gatherings with Friends and Family: More than three times a week   Attends Religious Services: More than 4 times per year   Active Member of [employer] or Organizations: Yes   Attends Archivist Meetings: More than 4 times per year   Marital Status: Widowed  Intimate Partner Violence: Not At Risk   Fear of Current or Ex-Partner: No   Emotionally Abused: No   Physically Abused: No   Sexually Abused: No    Outpatient Encounter Medications as of 07/18/2021  Medication Sig   acetaminophen (TYLENOL) 500 MG tablet Take 500 mg by mouth every  6 (six) hours as needed for mild pain.   albuterol (VENTOLIN HFA) 108 (90 Base) MCG/ACT inhaler Inhale 2 puffs into the lungs every 4 (four) hours as needed for shortness of breath.   aspirin EC 81 MG tablet Take 1 tablet (81 mg total) by mouth daily.   baclofen (LIORESAL) 10 MG tablet Take 1 tablet (10 mg total) by mouth 3 (three) times daily.   carvedilol (COREG) 3.125 MG tablet Take 1 tablet (3.125 mg total) by mouth 2 (two) times daily with a meal.   cholecalciferol (VITAMIN D) 1000 UNITS tablet Take 1,000 Units by mouth daily.   DULoxetine (CYMBALTA) 60 MG capsule TAKE 1 CAPSULE BY MOUTH ONCE DAILY.   fluticasone (FLONASE) 50 MCG/ACT nasal spray Place 2 sprays into both nostrils daily.   fluticasone (FLOVENT HFA) 44 MCG/ACT inhaler Inhale 2 puffs into the lungs 2 (two) times daily.   furosemide (LASIX) 40 MG tablet Take 1 tablet (40 mg total) by mouth daily.   gabapentin (NEURONTIN) 300 MG  capsule Take 300 mg by mouth 3 (three) times daily as needed (pain).   HYDROcodone-acetaminophen (NORCO) 10-325 MG tablet hydrocodone 10 mg-acetaminophen 325 mg tablet  Take 1 tablet 3 times a day by oral route as needed for pain for 30 days.   lactulose (CHRONULAC) 10 GM/15ML solution TAKE 30 MLS (20 GRAM TOTAL) BY MOUTH 3 TIMES DAILY.   lamoTRIgine (LAMICTAL) 200 MG tablet Take 1 tablet (200 mg total) by mouth 2 (two) times daily.   LORazepam (ATIVAN) 0.5 MG tablet Take 1 tablet (0.5 mg total) by mouth 2 (two) times daily as needed for anxiety.   melatonin 5 MG TABS Take 5 mg by mouth at bedtime.   naloxone (NARCAN) nasal spray 4 mg/0.1 mL Place into the nose once.   ondansetron (ZOFRAN) 4 MG tablet Take 1 tablet (4 mg total) by mouth every 8 (eight) hours as needed for nausea or vomiting.   pantoprazole (PROTONIX) 40 MG tablet Take 1 tablet (40 mg total) by mouth daily before breakfast.   potassium chloride SA (KLOR-CON M) 20 MEQ tablet Take 1 tablet (20 mEq total) by mouth daily.    sacubitril-valsartan (ENTRESTO) 24-26 MG Take 1 tablet by mouth 2 (two) times daily.   [DISCONTINUED] fluticasone (FLONASE) 50 MCG/ACT nasal spray Place 2 sprays into both nostrils daily.   [DISCONTINUED] levocetirizine (XYZAL) 5 MG tablet Take 1 tablet (5 mg total) by mouth every evening.   levocetirizine (XYZAL) 5 MG tablet Take 1 tablet (5 mg total) by mouth every evening.   No facility-administered encounter medications on file as of 07/18/2021.    No Known Allergies  Review of Systems  Constitutional:  Negative for activity change, appetite change, chills, diaphoresis, fatigue, fever and unexpected weight change.  HENT:  Positive for congestion, postnasal drip and rhinorrhea. Negative for sinus pressure, sinus pain, sneezing, sore throat, tinnitus, trouble swallowing and voice change.   Respiratory:  Positive for cough. Negative for apnea, choking, chest tightness, shortness of breath, wheezing and stridor.   Cardiovascular:  Negative for chest pain, palpitations and leg swelling.  Gastrointestinal:  Negative for abdominal pain, diarrhea, nausea and vomiting.  Genitourinary:  Negative for decreased urine volume and difficulty urinating.  Skin:  Positive for wound. Negative for color change, pallor and rash.  Neurological:  Negative for dizziness, weakness and headaches.  Psychiatric/Behavioral:  Negative for confusion.   All other systems reviewed and are negative.      Objective:  BP 119/74   Temp 97.9 F (36.6 C)   Ht 5' 4"  (1.626 m)   Wt 176 lb (79.8 kg)   BMI 30.21 kg/m    Wt Readings from Last 3 Encounters:  07/18/21 176 lb (79.8 kg)  07/17/21 175 lb 3.2 oz (79.5 kg)  07/05/21 177 lb (80.3 kg)    Physical Exam Vitals and nursing note reviewed.  Constitutional:      General: She is not in acute distress.    Appearance: Normal appearance. She is obese. She is not ill-appearing, toxic-appearing or diaphoretic.  HENT:     Head: Normocephalic and atraumatic.      Right Ear: Decreased hearing noted.     Left Ear: Decreased hearing noted.     Ears:     Comments: Bilateral hearing aids    Nose: Congestion and rhinorrhea present. Rhinorrhea is clear.     Right Turbinates: Enlarged and pale.     Left Turbinates: Enlarged and pale.     Right Sinus: No maxillary sinus tenderness or frontal  sinus tenderness.     Left Sinus: No maxillary sinus tenderness or frontal sinus tenderness.     Mouth/Throat:     Lips: Pink.     Mouth: Mucous membranes are moist.     Pharynx: No pharyngeal swelling, oropharyngeal exudate, posterior oropharyngeal erythema or uvula swelling.     Tonsils: No tonsillar exudate or tonsillar abscesses.     Comments: Postnasal drainage with cobblestoning Eyes:     Conjunctiva/sclera: Conjunctivae normal.     Pupils: Pupils are equal, round, and reactive to light.  Cardiovascular:     Rate and Rhythm: Normal rate and regular rhythm.     Heart sounds: Normal heart sounds.  Pulmonary:     Effort: Pulmonary effort is normal.     Breath sounds: Stridor present.  Musculoskeletal:     Right lower leg: No edema.     Left lower leg: No edema.  Skin:    General: Skin is warm.     Capillary Refill: Capillary refill takes less than 2 seconds.     Findings: Wound present.       Neurological:     General: No focal deficit present.     Mental Status: She is alert and oriented to person, place, and time.  Psychiatric:        Mood and Affect: Mood normal.        Behavior: Behavior normal.        Thought Content: Thought content normal.        Judgment: Judgment normal.      Results for orders placed or performed in visit on 05/15/21  CBC with Differential/Platelet  Result Value Ref Range   WBC 11.9 (H) 3.4 - 10.8 x10E3/uL   RBC 4.57 3.77 - 5.28 x10E6/uL   Hemoglobin 13.4 11.1 - 15.9 g/dL   Hematocrit 39.4 34.0 - 46.6 %   MCV 86 79 - 97 fL   MCH 29.3 26.6 - 33.0 pg   MCHC 34.0 31.5 - 35.7 g/dL   RDW 13.2 11.7 - 15.4 %    Platelets 200 150 - 450 x10E3/uL   Neutrophils 68 Not Estab. %   Lymphs 19 Not Estab. %   Monocytes 11 Not Estab. %   Eos 1 Not Estab. %   Basos 0 Not Estab. %   Neutrophils Absolute 8.0 (H) 1.4 - 7.0 x10E3/uL   Lymphocytes Absolute 2.3 0.7 - 3.1 x10E3/uL   Monocytes Absolute 1.3 (H) 0.1 - 0.9 x10E3/uL   EOS (ABSOLUTE) 0.1 0.0 - 0.4 x10E3/uL   Basophils Absolute 0.0 0.0 - 0.2 x10E3/uL   Immature Granulocytes 1 Not Estab. %   Immature Grans (Abs) 0.1 0.0 - 0.1 x10E3/uL  CMP14+EGFR  Result Value Ref Range   Glucose 96 70 - 99 mg/dL   BUN 14 8 - 27 mg/dL   Creatinine, Ser 0.87 0.57 - 1.00 mg/dL   eGFR 73 >59 mL/min/1.73   BUN/Creatinine Ratio 16 12 - 28   Sodium 137 134 - 144 mmol/L   Potassium 3.4 (L) 3.5 - 5.2 mmol/L   Chloride 96 96 - 106 mmol/L   CO2 28 20 - 29 mmol/L   Calcium 9.1 8.7 - 10.3 mg/dL   Total Protein 6.8 6.0 - 8.5 g/dL   Albumin 4.3 3.8 - 4.8 g/dL   Globulin, Total 2.5 1.5 - 4.5 g/dL   Albumin/Globulin Ratio 1.7 1.2 - 2.2   Bilirubin Total 0.3 0.0 - 1.2 mg/dL   Alkaline Phosphatase 116 44 - 121 IU/L  AST 24 0 - 40 IU/L   ALT 16 0 - 32 IU/L  Lipid panel  Result Value Ref Range   Cholesterol, Total 201 (H) 100 - 199 mg/dL   Triglycerides 131 0 - 149 mg/dL   HDL 72 >39 mg/dL   VLDL Cholesterol Cal 23 5 - 40 mg/dL   LDL Chol Calc (NIH) 106 (H) 0 - 99 mg/dL   Chol/HDL Ratio 2.8 0.0 - 4.4 ratio  Bayer DCA Hb A1c Waived  Result Value Ref Range   HB A1C (BAYER DCA - WAIVED) 6.4 (H) 4.8 - 5.6 %  ToxASSURE Select 13 (MW), Urine  Result Value Ref Range   Summary Note   TSH  Result Value Ref Range   TSH 1.350 0.450 - 4.500 uIU/mL       Pertinent labs & imaging results that were available during my care of the patient were reviewed by me and considered in my medical decision making.  Assessment & Plan:  Shalandra was seen today for insect bite.  Diagnoses and all orders for this visit:  Healing wound Wound healing well with no indications of continued  cellulitis. Wound care discussed in detail.   Seasonal allergic rhinitis due to pollen Symptomatic care discussed in detail. Flonase and Xyzal as prescribed. Can use Delsym for cough. Aware to report any new, worsening, or persistent symptoms.  -     fluticasone (FLONASE) 50 MCG/ACT nasal spray; Place 2 sprays into both nostrils daily. -     levocetirizine (XYZAL) 5 MG tablet; Take 1 tablet (5 mg total) by mouth every evening.     Continue all other maintenance medications.  Follow up plan: Return if symptoms worsen or fail to improve.   Continue healthy lifestyle choices, including diet (rich in fruits, vegetables, and lean proteins, and low in salt and simple carbohydrates) and exercise (at least 30 minutes of moderate physical activity daily).  Educational handout given for allergic rhinitis   The above assessment and management plan was discussed with the patient. The patient verbalized understanding of and has agreed to the management plan. Patient is aware to call the clinic if they develop any new symptoms or if symptoms persist or worsen. Patient is aware when to return to the clinic for a follow-up visit. Patient educated on when it is appropriate to go to the emergency department.   Monia Pouch, FNP-C [facility] [phone]

## 2021-07-19 ENCOUNTER — Telehealth: Payer: Self-pay

## 2021-07-19 ENCOUNTER — Telehealth: Payer: Self-pay | Admitting: Family Medicine

## 2021-07-19 NOTE — Telephone Encounter (Signed)
   Telephone encounter was:  Successful.  07/19/2021 Name: Carol Richmond MRN: [id number] DOB: 31-Dec-1950  Carol Richmond is a 71 y.o. year old female who is a primary care patient of Gwenlyn Perking, FNP . The community resource team was consulted for assistance with  eyeglasses.  Care guide performed the following interventions: Spoke with patient, she has not called the [facility] yet but plans to call regarding assistance with eyeglasses.  Follow Up Plan:  No further follow up planned at this time. The patient has been provided with needed resources.  Delainee Tramel, AAS Paralegal, [facility]  [address], [place] [address] ??[email or web address]  ?? [phone]   [email or web address]

## 2021-07-19 NOTE — Telephone Encounter (Signed)
Refer to previous message

## 2021-07-19 NOTE — Telephone Encounter (Signed)
Patient calling back. She is wanting a z pak called in. Please call back and advise.

## 2021-07-19 NOTE — Telephone Encounter (Signed)
Patient is leaving home and wants nurse to call he cell phone.

## 2021-07-20 NOTE — Telephone Encounter (Signed)
Unless she has developed fevers or anything is worsening I would not necessarily prescribe a Z-Pak.  I do think that she is okay to take ondansetron for feeling sick to her stomach and just make sure she stays hydrated and use the Mucinex and Flonase and the medicine that she was given for allergies.  If she develops any fevers over 101 then please let us know

## 2021-07-20 NOTE — Telephone Encounter (Signed)
Lmtcb.

## 2021-07-23 ENCOUNTER — Other Ambulatory Visit: Payer: Self-pay | Admitting: Family Medicine

## 2021-07-23 DIAGNOSIS — F411 Generalized anxiety disorder: Secondary | ICD-10-CM

## 2021-07-23 DIAGNOSIS — F339 Major depressive disorder, recurrent, unspecified: Secondary | ICD-10-CM

## 2021-07-24 ENCOUNTER — Other Ambulatory Visit (INDEPENDENT_AMBULATORY_CARE_PROVIDER_SITE_OTHER): Payer: Self-pay | Admitting: Internal Medicine

## 2021-07-24 NOTE — Telephone Encounter (Signed)
Seen 04/03/21 takes for Hepatic encephalopathy

## 2021-07-26 ENCOUNTER — Telehealth (INDEPENDENT_AMBULATORY_CARE_PROVIDER_SITE_OTHER): Payer: Self-pay

## 2021-07-26 NOTE — Telephone Encounter (Signed)
Abdomen was swelling and pain in mid left side. Started about one week ago. Swelling and pain are off and on. Swelling was in the middle and on left side. Can happen before she eats or after. States pain is sharp. No fever, no nausea, no vomiting, no abdomen.

## 2021-07-26 NOTE — Telephone Encounter (Signed)
I have called the patient and left a message if she still needs our assistance to please return call to [place] a the office.   Patient called stating she is having some gi issues.

## 2021-07-26 NOTE — Telephone Encounter (Signed)
Per Dr. Laural Golden - needs appt  next week with any provider. I did call patient to let her know Mitzie would reach out to her next week when she had a cancellation. Mitzie she would like for you to call her on her cell. Patient advised to call pcp or go ED if worse

## 2021-07-30 ENCOUNTER — Ambulatory Visit (INDEPENDENT_AMBULATORY_CARE_PROVIDER_SITE_OTHER): Payer: Medicare HMO | Admitting: Family Medicine

## 2021-07-30 ENCOUNTER — Other Ambulatory Visit: Payer: Self-pay | Admitting: Family

## 2021-07-30 VITALS — BP 121/68 | HR 89 | Temp 97.2°F | Ht 64.0 in | Wt 174.0 lb

## 2021-07-30 DIAGNOSIS — J301 Allergic rhinitis due to pollen: Secondary | ICD-10-CM

## 2021-07-30 DIAGNOSIS — J0101 Acute recurrent maxillary sinusitis: Secondary | ICD-10-CM | POA: Diagnosis not present

## 2021-07-30 MED ORDER — AZITHROMYCIN 250 MG PO TABS: ORAL_TABLET | ORAL | 0 refills | Status: DC

## 2021-07-30 NOTE — Progress Notes (Addendum)
   Acute Office Visit  Subjective:     [name], female    DOB: 09-12-1950, 71 y.o.   MRN: [id number]  Chief Complaint  Patient presents with   Sinusitis    HPI Patient is in today for ongoing congestion, sinus drainage, and cough for the last few weeks. She reports headache as well with pressure around her eyes and cheeks. She has been taking flonase, xyzal without improvement. Denies fever, shortness of breath, wheezing, or chest pain.   ROS As per HPI.      Objective:    BP 121/68   Pulse 89   Temp (!) 97.2 F (36.2 C) (Temporal)   Ht 5\' 4"  (1.626 m)   Wt 174 lb (78.9 kg)   SpO2 95%   BMI 29.87 kg/m    Physical Exam Vitals and nursing note reviewed.  Constitutional:      General: She is not in acute distress.    Appearance: She is not ill-appearing, toxic-appearing or diaphoretic.  HENT:     Right Ear: Ear canal and external ear normal.     Left Ear: Ear canal and external ear normal.     Nose: Congestion present.     Right Sinus: Maxillary sinus tenderness and frontal sinus tenderness present.     Left Sinus: Maxillary sinus tenderness and frontal sinus tenderness present.  Cardiovascular:     Rate and Rhythm: Normal rate and regular rhythm.     Heart sounds: Normal heart sounds. No murmur heard. Pulmonary:     Effort: Pulmonary effort is normal. No respiratory distress.     Breath sounds: Normal breath sounds. No wheezing, rhonchi or rales.  Chest:     Chest wall: No tenderness.  Neurological:     General: No focal deficit present.     Mental Status: She is alert and oriented to person, place, and time.  Psychiatric:        Mood and Affect: Mood normal.        Behavior: Behavior normal.     No results found for any visits on 07/30/21.      Assessment & Plan:   Carol Richmond was seen today for sinusitis.  Diagnoses and all orders for this visit:  Acute recurrent maxillary sinusitis Seasonal allergic rhinitis due to  pollen Uncontrolled. Zpak as below. Continue flonase and xyal, discussed compliance with this. Discussed can add singular if symptoms continue.  -     azithromycin (ZITHROMAX Z-PAK) 250 MG tablet; As directed   Return in about 4 weeks (around 08/27/2021) for for chronic follow up.  The patient indicates understanding of these issues and agrees with the plan.  Gabriel Earing, FNP

## 2021-07-30 NOTE — Patient Instructions (Signed)

## 2021-08-01 ENCOUNTER — Encounter: Payer: Self-pay | Admitting: Family Medicine

## 2021-08-01 NOTE — Telephone Encounter (Signed)
Pt was seen in office 07/30/21, will close encounter.

## 2021-08-02 ENCOUNTER — Encounter (INDEPENDENT_AMBULATORY_CARE_PROVIDER_SITE_OTHER): Payer: Self-pay | Admitting: Gastroenterology

## 2021-08-02 ENCOUNTER — Ambulatory Visit (INDEPENDENT_AMBULATORY_CARE_PROVIDER_SITE_OTHER): Payer: Medicare HMO | Admitting: Gastroenterology

## 2021-08-02 VITALS — BP 113/69 | HR 77 | Temp 98.4°F | Ht 64.0 in | Wt 174.4 lb

## 2021-08-02 DIAGNOSIS — R140 Abdominal distension (gaseous): Secondary | ICD-10-CM | POA: Diagnosis not present

## 2021-08-02 DIAGNOSIS — R1010 Upper abdominal pain, unspecified: Secondary | ICD-10-CM

## 2021-08-02 DIAGNOSIS — R1012 Left upper quadrant pain: Secondary | ICD-10-CM | POA: Insufficient documentation

## 2021-08-02 MED ORDER — SUCRALFATE 1 G PO TABS: 1.0000 g | ORAL_TABLET | Freq: Three times a day (TID) | ORAL | 0 refills | Status: DC

## 2021-08-02 NOTE — Progress Notes (Unsigned)
Referring Provider: Gwenlyn Perking, FNP Primary Care Physician:  Gwenlyn Perking, FNP Primary GI Physician: previously [facility] Chief Complaint  Patient presents with   Abdominal Pain    Abdominal pain on both sides but mostly on left side. Having bloating. Started about 2 weeks.    HPI:   Carol Richmond is a 71 y.o. female with past medical history of NASH Cirrhosis, gastric ulcers and GERD.   Patient presenting today for upper abdominal pain.  History:  Patient last seen in feb 2023, heartburn well controlled at that time with 3 BMs per day, [place] Abd complete at that time with cirrhotic liver without other findings, labs in March 2023 with TSH 1.35, normal LFTs, WBC 11.9, platelets 200k, AFP 3.3, INR 1. Maintained on lactulose and pantoprazole.   Present:  Today, Patient reports LUQ and some epigastric pain that started 2 weeks ago. She denies nausea or vomiting. She states that she does have some breakthrough reflux symptoms and bloating. She is using chewable alka seltzer on occasion,  maybe once per week. She denies constipation, states she has some looser stools on lactulose, dose was recently decreased by Dr. Laural Golden, she is having 3-4 looser BMs per day. She reports that abdomen becomes bloated but denies any tautness. She is passing gas. She reports that lying on her left side can make the pain worse, sometimes improved if she lays on her back. She does not take any NSAIDs. States that appetite is good, eating does not seem to make her pain worse or better. She has no rectal bleeding, melena or early satiety.   Last Colonoscopy:2019  normal colonoscopy, recommend repeat in 5 years Last Endoscopy:- 03/07/21 Normal hypopharynx. - Normal proximal esophagus and mid esophagus. - Grade I esophageal varices. - Z-line irregular, 37 cm from the incisors. - Portal hypertensive gastropathy. - Food was found in the stomach. - Erosive gastropathy with no stigmata of recent  bleeding(secondary to NSAID; gastric biopsy negative for H. pylori in August 2020) - Normal duodenal bulb and second portion of the duodenum. - No specimens collected.  Recommendations:  Repeat EGD in jan 2024 Repeat colonoscopy in 2024  Past Medical History:  Diagnosis Date   Asthma    Carotid artery disease ([place])    Diverticulosis of colon (without mention of hemorrhage)    Essential hypertension    Functional ovarian cysts    GERD (gastroesophageal reflux disease)    Hemorrhoids, external, without mention of complication    Hormone replacement therapy (postmenopausal)    IBS (irritable bowel syndrome)    Migraine    Mitral valve disease    Right knee DJD    Seizures ([facility])    Sleep apnea    TIA (transient ischemic attack)    Type 2 diabetes mellitus ([place])    Type 2 diabetes mellitus with hyperglycemia, without long-term current use of insulin ([place]) 08/07/2017    Past Surgical History:  Procedure Laterality Date   BIOPSY  09/23/2018   Procedure: BIOPSY;  Surgeon: Rogene Houston, MD;  [facility]: [facility];  Service: Endoscopy;;  duodeneum and stomach   CHOLECYSTECTOMY  1980   ENDARTERECTOMY Right 07/01/2017   Procedure: ENDARTERECTOMY CAROTID RIGHT;  Surgeon: Serafina Mitchell, MD;  Location: [facility] OR;  Service: Vascular;  Laterality: Right;   ESOPHAGOGASTRODUODENOSCOPY N/A 09/23/2018   Procedure: ESOPHAGOGASTRODUODENOSCOPY (EGD);  Surgeon: Rogene Houston, MD;  Location: [facility];  Service: Endoscopy;  Laterality: N/A;  2:45-moved to 1:45 per Lelon Frohlich  ESOPHAGOGASTRODUODENOSCOPY (EGD) WITH PROPOFOL N/A 03/07/2021   Procedure: ESOPHAGOGASTRODUODENOSCOPY (EGD) WITH PROPOFOL;  Surgeon: Rogene Houston, MD;  [facility]: [facility];  Service: Endoscopy;  Laterality: N/A;  12:50   LEFT HEART CATHETERIZATION WITH CORONARY ANGIOGRAM N/A 11/18/2013   Procedure: LEFT HEART CATHETERIZATION WITH CORONARY ANGIOGRAM;  Surgeon: Burnell Blanks, MD;  Location: [facility] CATH LAB;  Service:  Cardiovascular;  Laterality: N/A;   OOPHORECTOMY     2012 Dr. Adah Perl in [place] Right 07/01/2017   Procedure: RIGHT CAROTID PATCH ANGIOPLASTY;  Surgeon: Serafina Mitchell, MD;  Location: [place];  Service: Vascular;  Laterality: Right;   TONSILLECTOMY     TOTAL KNEE ARTHROPLASTY Right 09/02/2012   Procedure: TOTAL KNEE ARTHROPLASTY- right;  Surgeon: Ninetta Lights, MD;  Location: [place];  Service: Orthopedics;  Laterality: Right;   TUBAL LIGATION     TYMPANOMASTOIDECTOMY Left 04/02/2016   Procedure: LEFT CANAL WALL DOWN TYMPANOMASTOIDECTOMY;  Surgeon: Leta Baptist, MD;  Location: [place];  Service: ENT;  Laterality: Left;   [place] Right 07/02/2017   Procedure: RIGHT NECK EXPLORATION WITH INTRAOPERATIVE ULTRASOUND;  Surgeon: Angelia Mould, MD;  Location: [facility] OR;  Service: Vascular;  Laterality: Right;    Current Outpatient Medications  Medication Sig Dispense Refill   acetaminophen (TYLENOL) 500 MG tablet Take 500 mg by mouth every 6 (six) hours as needed for mild pain.     albuterol (VENTOLIN HFA) 108 (90 Base) MCG/ACT inhaler Inhale 2 puffs into the lungs every 4 (four) hours as needed for shortness of breath. 18 g 11   aspirin EC 81 MG tablet Take 1 tablet (81 mg total) by mouth daily.     baclofen (LIORESAL) 10 MG tablet Take 1 tablet (10 mg total) by mouth 3 (three) times daily. 30 each 2   carvedilol (COREG) 3.125 MG tablet TAKE 1 TABLET BY MOUTH 2 TIMES A DAY WITH MEALS. 60 tablet 2   cholecalciferol (VITAMIN D) 1000 UNITS tablet Take 1,000 Units by mouth daily.     DULoxetine (CYMBALTA) 60 MG capsule TAKE 1 CAPSULE BY MOUTH ONCE DAILY. 30 capsule 0   fluticasone (FLONASE) 50 MCG/ACT nasal spray Place 2 sprays into both nostrils daily. 16 g 6   fluticasone (FLOVENT HFA) 44 MCG/ACT inhaler Inhale 2 puffs into the lungs 2 (two) times daily. 1 each 12   furosemide (LASIX) 40 MG tablet Take 1 tablet (40 mg total) by  mouth daily. 90 tablet 1   gabapentin (NEURONTIN) 300 MG capsule Take 300 mg by mouth 3 (three) times daily as needed (pain).     HYDROcodone-acetaminophen (NORCO) 10-325 MG tablet hydrocodone 10 mg-acetaminophen 325 mg tablet  Take 1 tablet 3 times a day by oral route as needed for pain for 30 days.     lactulose (CHRONULAC) 10 GM/15ML solution TAKE 30 MLS (20 GRAM TOTAL) BY MOUTH 3 TIMES A DAY. 2700 mL 5   lamoTRIgine (LAMICTAL) 200 MG tablet Take 1 tablet (200 mg total) by mouth 2 (two) times daily. 180 tablet 3   levocetirizine (XYZAL) 5 MG tablet Take 1 tablet (5 mg total) by mouth every evening. 30 tablet 1   LORazepam (ATIVAN) 0.5 MG tablet Take 1 tablet (0.5 mg total) by mouth 2 (two) times daily as needed for anxiety. 30 tablet 5   melatonin 5 MG TABS Take 5 mg by mouth at bedtime.     naloxone (NARCAN) nasal spray 4  mg/0.1 mL Place into the nose once.     ondansetron (ZOFRAN) 4 MG tablet Take 1 tablet (4 mg total) by mouth every 8 (eight) hours as needed for nausea or vomiting. 20 tablet 2   pantoprazole (PROTONIX) 40 MG tablet Take 1 tablet (40 mg total) by mouth daily before breakfast. 90 tablet 3   potassium chloride SA (KLOR-CON M) 20 MEQ tablet Take 1 tablet (20 mEq total) by mouth daily. 90 tablet 3   PRESCRIPTION MEDICATION Zpack     sacubitril-valsartan (ENTRESTO) 24-26 MG Take 1 tablet by mouth 2 (two) times daily. 180 tablet 3   No current facility-administered medications for this visit.    Allergies as of 08/02/2021   (No Known Allergies)    Family History  Problem Relation Age of Onset   Heart disease Mother    Diabetes Mother    Heart disease Brother    Cancer Sister    Heart disease Brother    Heart disease Brother    Heart disease Son     Social History   Socioeconomic History   Marital status: Widowed    Spouse name: Not on file   Number of children: 3   Years of education: Not on file   Highest education level: Not on file  Occupational History    Occupation: retired  Tobacco Use   Smoking status: Never   Smokeless tobacco: Never  Vaping Use   Vaping Use: Never used  Substance and Sexual Activity   Alcohol use: No    Alcohol/week: 0.0 standard drinks of alcohol   Drug use: No   Sexual activity: Not on file  Other Topics Concern   Not on file  Social History Narrative   Lives alone. Granddaughter lives across the street, her 3 children all live nearby   Social Determinants of Health   Financial Resource Strain: Medium Risk (07/11/2021)   Overall Financial Resource Strain (CARDIA)    Difficulty of Paying Living Expenses: Somewhat hard  Food Insecurity: Food Insecurity Present (07/05/2021)   Hunger Vital Sign    Worried About Running Out of Food in the Last Year: Sometimes true    Ran Out of Food in the Last Year: Never true  Transportation Needs: No Transportation Needs (07/05/2021)   PRAPARE - Hydrologist (Medical): No    Lack of Transportation (Non-Medical): No  Physical Activity: Insufficiently Active (07/05/2021)   Exercise Vital Sign    Days of Exercise per Week: 7 days    Minutes of Exercise per Session: 20 min  Stress: No Stress Concern Present (07/05/2021)   [place]    Feeling of Stress : Not at all  Social Connections: Moderately Integrated (07/05/2021)   Social Connection and Isolation Panel [NHANES]    Frequency of Communication with Friends and Family: More than three times a week    Frequency of Social Gatherings with Friends and Family: More than three times a week    Attends Religious Services: More than 4 times per year    Active Member of [employer] or Organizations: Yes    Attends Archivist Meetings: More than 4 times per year    Marital Status: Widowed   Review of systems General: negative for malaise, night sweats, fever, chills, weight loss Neck: Negative for lumps, goiter, pain and significant  neck swelling Resp: Negative for cough, wheezing, dyspnea at rest CV: Negative for chest pain, leg swelling, palpitations, orthopnea  GI: denies melena, hematochezia, nausea, vomiting, diarrhea, constipation, dysphagia, odyonophagia, early satiety or unintentional weight loss. LUQ pain MSK: Negative for joint pain or swelling, back pain, and muscle pain. Derm: Negative for itching or rash Psych: Denies depression, anxiety, memory loss, confusion. No homicidal or suicidal ideation.  Heme: Negative for prolonged bleeding, bruising easily, and swollen nodes. Endocrine: Negative for cold or heat intolerance, polyuria, polydipsia and goiter. Neuro: negative for tremor, gait imbalance, syncope and seizures. The remainder of the review of systems is noncontributory.  Physical Exam: BP 113/69 (BP Location: Left Arm, Patient Position: Sitting, Cuff Size: Large)   Pulse 77   Temp 98.4 F (36.9 C) (Oral)   Ht 5' 4"  (1.626 m)   Wt 174 lb 6.4 oz (79.1 kg)   BMI 29.94 kg/m  General:   Alert and oriented. No distress noted. Pleasant and cooperative.  Head:  Normocephalic and atraumatic. Eyes:  Conjuctiva clear without scleral icterus. Mouth:  Oral mucosa pink and moist. Good dentition. No lesions. Heart: Normal rate and rhythm, s1 and s2 heart sounds present.  Lungs: Clear lung sounds in all lobes. Respirations equal and unlabored. Abdomen:  +BS, soft, and non-distended. TTP of epigastric to LUQ.  No rebound or guarding. No HSM or masses noted. Derm: No palmar erythema or jaundice Msk:  Symmetrical without gross deformities. Normal posture. Extremities:  Without edema. Neurologic:  Alert and  oriented x4 Psych:  Alert and cooperative. Normal mood and affect.  Invalid input(s): "6 MONTHS"   ASSESSMENT: MASIEL GENTZLER is a 71 y.o. female presenting today for upper abdominal pain.  Patient with upper abdominal pain, usually LUQ for the past few weeks with some associated bloating. Denies  rectal bleeding or melena. No nausea or vomiting. Recent EGD in jan 2023 with Grade I esophageal varices, Portal hypertensive gastropathy, Erosive gastropathy with no stigmata of recent bleeding(secondary to NSAIDs). Patient currently maintained on PPI once daily. Given findings of recent EGD, suspect upper abdominal discomfort is related to presence of gastropathy. Recommended switching PPI, however, Patient tells me she just refilled protonix and does not want to waste the prescription, will increase protonix to BID and add carafate 1g QID. She will let me know if she has any new or worsening symptoms.    PLAN:  Increase protonix 86m to BID 2. Rx carafate 1g QID (pt prefers tablet) 3. Repeat liver [place] 4. Due for MELD labs, INR and AFP in Sept  All questions were answered, patient verbalized understanding and is in agreement with plan as outlined above.    Follow Up: 6-8 weeks  Christia Domke L. CAlver Sorrow MSN, APRN, AGNP-C Adult-Gerontology Nurse Practitioner RDrex[facility]

## 2021-08-02 NOTE — Patient Instructions (Addendum)
Please take your pantoprazole/protonix 22m twice a day, take one in the morning prior to breakfast and take a second one in the evening prior to dinner. I am sending carafate 1g to your pharmacy  I suspect symptoms could be related to the erosions found in your stomach on recent EGD in January  Please let me know if you develop any new or worsening GI symptoms  Follow up 6-8 weeks

## 2021-08-09 ENCOUNTER — Telehealth (INDEPENDENT_AMBULATORY_CARE_PROVIDER_SITE_OTHER): Payer: Self-pay

## 2021-08-09 NOTE — Telephone Encounter (Signed)
Patient called stating since starting Carafate 1 gram Qid she has had increased bm with diarrhea. She is on Lactulose 30 ml TID and normally has 2-3 bm with this,but since starting the Carafate on 08/03/2021 she has had up to 7-8 bm loose stools per day. She says she did not take the Carafate this am,but has taken the Lactulose and had had two loose stools so far this morning.

## 2021-08-09 NOTE — Telephone Encounter (Signed)
Patient aware of all.

## 2021-08-10 ENCOUNTER — Encounter (INDEPENDENT_AMBULATORY_CARE_PROVIDER_SITE_OTHER): Payer: Self-pay

## 2021-08-10 DIAGNOSIS — H43812 Vitreous degeneration, left eye: Secondary | ICD-10-CM | POA: Diagnosis not present

## 2021-08-10 DIAGNOSIS — H524 Presbyopia: Secondary | ICD-10-CM | POA: Diagnosis not present

## 2021-08-15 ENCOUNTER — Encounter (INDEPENDENT_AMBULATORY_CARE_PROVIDER_SITE_OTHER): Payer: Medicare HMO | Admitting: Ophthalmology

## 2021-08-23 ENCOUNTER — Other Ambulatory Visit: Payer: Self-pay | Admitting: Family Medicine

## 2021-08-23 DIAGNOSIS — F339 Major depressive disorder, recurrent, unspecified: Secondary | ICD-10-CM

## 2021-08-23 DIAGNOSIS — F411 Generalized anxiety disorder: Secondary | ICD-10-CM

## 2021-08-27 ENCOUNTER — Encounter: Payer: Self-pay | Admitting: Family Medicine

## 2021-08-27 ENCOUNTER — Other Ambulatory Visit: Payer: Self-pay | Admitting: Family Medicine

## 2021-08-27 ENCOUNTER — Ambulatory Visit (INDEPENDENT_AMBULATORY_CARE_PROVIDER_SITE_OTHER): Payer: Medicare HMO | Admitting: Family Medicine

## 2021-08-27 VITALS — BP 111/63 | HR 86 | Temp 97.6°F | Ht 64.0 in | Wt 174.5 lb

## 2021-08-27 DIAGNOSIS — I10 Essential (primary) hypertension: Secondary | ICD-10-CM

## 2021-08-27 DIAGNOSIS — R0609 Other forms of dyspnea: Secondary | ICD-10-CM

## 2021-08-27 DIAGNOSIS — R062 Wheezing: Secondary | ICD-10-CM

## 2021-08-27 NOTE — Progress Notes (Signed)
   Established Patient Office Visit  Subjective   [name], female    DOB: [birth year]  Age: 71 y.o. MRN: [id number]  Chief Complaint  Patient presents with  . Shortness of Breath    HPI Chandler presents for shortness of breath. This occurred 2 days ago when she was walking for an extended period. She reports improvement in symptoms when she rested.   Past Medical History:  Diagnosis Date  . Asthma   . Carotid artery disease ([place])   . Diverticulosis of colon (without mention of hemorrhage)   . Essential hypertension   . Functional ovarian cysts   . GERD (gastroesophageal reflux disease)   . Hemorrhoids, external, without mention of complication   . Hormone replacement therapy (postmenopausal)   . IBS (irritable bowel syndrome)   . Migraine   . Mitral valve disease   . Right knee DJD   . Seizures ([place])   . Sleep apnea   . TIA (transient ischemic attack)   . Type 2 diabetes mellitus ([place])   . Type 2 diabetes mellitus with hyperglycemia, without long-term current use of insulin ([facility]) 08/07/2017      ROS    Objective:     BP 111/63   Pulse 86   Temp 97.6 F (36.4 C) (Temporal)   Ht 5' 4"  (1.626 m)   Wt 174 lb 8 oz (79.2 kg)   SpO2 96%   BMI 29.95 kg/m  BP Readings from Last 3 Encounters:  08/27/21 111/63  08/02/21 113/69  07/30/21 121/68      Physical Exam   No results found for any visits on 08/27/21.  {Labs (Optional):23779}  The 10-year ASCVD risk score (Arnett DK, et al., 2019) is: 15.1%    Assessment & Plan:   Problem List Items Addressed This Visit   None   No follow-ups on file.    Gwenlyn Perking, FNP

## 2021-08-27 NOTE — Patient Instructions (Signed)
Shortness of Breath, Adult Shortness of breath is when a person has trouble breathing or when a person feels like she or he is having trouble breathing in enough air. Shortness of breath could be a sign of a medical problem. Follow these instructions at home:  Pollutants Do not use any products that contain nicotine or tobacco. These products include cigarettes, chewing tobacco, and vaping devices, such as e-cigarettes. This also includes cigars and pipes. If you need help quitting, ask your health care provider. Avoid things that can irritate your airways, including: Smoke. This includes campfire smoke, forest fire smoke, and secondhand smoke from tobacco products. Do not smoke or allow others to smoke in your home. Mold. Dust. Air pollution. Chemical fumes. Things that can give you an allergic reaction (allergens) if you have allergies. Common allergens include pollen from grasses or trees and animal dander. Keep your living space clean and free of mold and dust. General instructions Pay attention to any changes in your symptoms. Take over-the-counter and prescription medicines only as told by your health care provider. This includes oxygen therapy and inhaled medicines. Rest as needed. Return to your normal activities as told by your health care provider. Ask your health care provider what activities are safe for you. Keep all follow-up visits. This is important. Contact a health care provider if: Your condition does not improve as soon as expected. You have a hard time doing your normal activities, even after you rest. You have new symptoms. You cannot walk up stairs or exercise the way that you normally do. Get help right away if: Your shortness of breath gets worse. You have shortness of breath when you are resting. You feel light-headed or you faint. You have a cough that is not controlled with medicines. You cough up blood. You have pain with breathing. You have pain in your  chest, arms, shoulders, or abdomen. You have a fever. These symptoms may be an emergency. Get help right away. Call 911. Do not wait to see if the symptoms will go away. Do not drive yourself to the hospital. Summary Shortness of breath is when a person has trouble breathing enough air. It can be a sign of a medical problem. Avoid things that irritate your lungs, such as smoking, pollution, mold, and dust. Pay attention to changes in your symptoms and contact your health care provider if you have a hard time completing daily activities because of shortness of breath. This information is not intended to replace advice given to you by your health care provider. Make sure you discuss any questions you have with your health care provider. Document Revised: 09/23/2020 Document Reviewed: 09/23/2020 Elsevier Patient Education  2023 Elsevier Inc.  

## 2021-08-28 ENCOUNTER — Telehealth: Payer: Self-pay | Admitting: Family Medicine

## 2021-08-28 DIAGNOSIS — R062 Wheezing: Secondary | ICD-10-CM

## 2021-08-28 NOTE — Telephone Encounter (Signed)
LMOVM RF sent to pharmacy, done in request pool

## 2021-08-28 NOTE — Telephone Encounter (Signed)
  Prescription Request  08/28/2021  Is this a "Controlled Substance" medicine? no  Have you seen your PCP in the last 2 weeks? Yes, 7/10  If YES, route message to pool  -  If NO, patient needs to be scheduled for appointment.  What is the name of the medication or equipment? albuterol (VENTOLIN HFA) 108 (90 Base) MCG/ACT inhaler  Have you contacted your pharmacy to request a refill? yes   Which pharmacy would you like this sent to? [facility] (Ph: [phone])   Patient notified that their request is being sent to the clinical staff for review and that they should receive a response within 2 business days.

## 2021-09-10 ENCOUNTER — Telehealth (INDEPENDENT_AMBULATORY_CARE_PROVIDER_SITE_OTHER): Payer: Self-pay | Admitting: *Deleted

## 2021-09-10 NOTE — Telephone Encounter (Signed)
See other message from today

## 2021-09-10 NOTE — Telephone Encounter (Signed)
Pt left voicemail that she wanted a nurse to call her.  [phone]

## 2021-09-10 NOTE — Telephone Encounter (Signed)
Patient called states pantoprazole was changed at last visit on 08/02/20 from one daily to one bid but new directions not sent to pharmacy so when she doubled up on rx with directions one daily she ran out early and now has to pay out of pocket for rx. They brought her rx today but directions are still one daily so she will run out early again. She is requesting rx sent in for one bid directions so it will be correct next time and she wont have to pay out of pocket.  [place].

## 2021-09-11 ENCOUNTER — Other Ambulatory Visit (INDEPENDENT_AMBULATORY_CARE_PROVIDER_SITE_OTHER): Payer: Self-pay | Admitting: *Deleted

## 2021-09-11 MED ORDER — PANTOPRAZOLE SODIUM 40 MG PO TBEC: 40.0000 mg | DELAYED_RELEASE_TABLET | Freq: Two times a day (BID) | ORAL | 0 refills | Status: DC

## 2021-09-11 NOTE — Telephone Encounter (Signed)
Patient notified

## 2021-09-12 ENCOUNTER — Ambulatory Visit (INDEPENDENT_AMBULATORY_CARE_PROVIDER_SITE_OTHER): Payer: Medicare HMO | Admitting: Family Medicine

## 2021-09-12 ENCOUNTER — Encounter: Payer: Self-pay | Admitting: Family Medicine

## 2021-09-12 VITALS — BP 99/56 | HR 76 | Temp 96.5°F | Ht 64.0 in | Wt 178.5 lb

## 2021-09-12 DIAGNOSIS — I10 Essential (primary) hypertension: Secondary | ICD-10-CM

## 2021-09-12 DIAGNOSIS — Z79899 Other long term (current) drug therapy: Secondary | ICD-10-CM

## 2021-09-12 DIAGNOSIS — F1320 Sedative, hypnotic or anxiolytic dependence, uncomplicated: Secondary | ICD-10-CM

## 2021-09-12 DIAGNOSIS — E1165 Type 2 diabetes mellitus with hyperglycemia: Secondary | ICD-10-CM

## 2021-09-12 DIAGNOSIS — E782 Mixed hyperlipidemia: Secondary | ICD-10-CM

## 2021-09-12 DIAGNOSIS — I5020 Unspecified systolic (congestive) heart failure: Secondary | ICD-10-CM

## 2021-09-12 DIAGNOSIS — F411 Generalized anxiety disorder: Secondary | ICD-10-CM | POA: Diagnosis not present

## 2021-09-12 DIAGNOSIS — K7460 Unspecified cirrhosis of liver: Secondary | ICD-10-CM | POA: Diagnosis not present

## 2021-09-12 DIAGNOSIS — R7303 Prediabetes: Secondary | ICD-10-CM | POA: Diagnosis not present

## 2021-09-12 LAB — CBC WITH DIFFERENTIAL/PLATELET
Basophils Absolute: 0 10*3/uL (ref 0.0–0.2)
Basos: 0 %
EOS (ABSOLUTE): 0.1 10*3/uL (ref 0.0–0.4)
Eos: 2 %
Hematocrit: 37.2 % (ref 34.0–46.6)
Hemoglobin: 12.6 g/dL (ref 11.1–15.9)
Immature Grans (Abs): 0.1 10*3/uL (ref 0.0–0.1)
Immature Granulocytes: 1 %
Lymphocytes Absolute: 1.4 10*3/uL (ref 0.7–3.1)
Lymphs: 21 %
MCH: 29.5 pg (ref 26.6–33.0)
MCHC: 33.9 g/dL (ref 31.5–35.7)
MCV: 87 fL (ref 79–97)
Monocytes Absolute: 0.9 10*3/uL (ref 0.1–0.9)
Monocytes: 13 %
Neutrophils Absolute: 4.2 10*3/uL (ref 1.4–7.0)
Neutrophils: 63 %
Platelets: 152 10*3/uL (ref 150–450)
RBC: 4.27 x10E6/uL (ref 3.77–5.28)
RDW: 13.4 % (ref 11.7–15.4)
WBC: 6.8 10*3/uL (ref 3.4–10.8)

## 2021-09-12 LAB — LIPID PANEL
Chol/HDL Ratio: 4.5 ratio — ABNORMAL HIGH (ref 0.0–4.4)
Cholesterol, Total: 213 mg/dL — ABNORMAL HIGH (ref 100–199)
HDL: 47 mg/dL (ref >39)
LDL Chol Calc (NIH): 102 mg/dL — ABNORMAL HIGH (ref 0–99)
Triglycerides: 380 mg/dL — ABNORMAL HIGH (ref 0–149)
VLDL Cholesterol Cal: 64 mg/dL — ABNORMAL HIGH (ref 5–40)

## 2021-09-12 LAB — CMP14+EGFR
ALT: 11 IU/L (ref 0–32)
AST: 21 IU/L (ref 0–40)
Albumin/Globulin Ratio: 1.7 (ref 1.2–2.2)
Albumin: 4.3 g/dL (ref 3.9–4.9)
Alkaline Phosphatase: 112 IU/L (ref 44–121)
BUN/Creatinine Ratio: 11 — ABNORMAL LOW (ref 12–28)
BUN: 10 mg/dL (ref 8–27)
Bilirubin Total: 0.3 mg/dL (ref 0.0–1.2)
CO2: 24 mmol/L (ref 20–29)
Calcium: 9.2 mg/dL (ref 8.7–10.3)
Chloride: 101 mmol/L (ref 96–106)
Creatinine, Ser: 0.88 mg/dL (ref 0.57–1.00)
Globulin, Total: 2.5 g/dL (ref 1.5–4.5)
Glucose: 264 mg/dL — ABNORMAL HIGH (ref 70–99)
Potassium: 3.9 mmol/L (ref 3.5–5.2)
Sodium: 141 mmol/L (ref 134–144)
Total Protein: 6.8 g/dL (ref 6.0–8.5)
eGFR: 71 mL/min/{1.73_m2} (ref >59)

## 2021-09-12 LAB — BAYER DCA HB A1C WAIVED: HB A1C (BAYER DCA - WAIVED): 7.1 % — ABNORMAL HIGH (ref 4.8–5.6)

## 2021-09-12 MED ORDER — LORAZEPAM 0.5 MG PO TABS: 0.5000 mg | ORAL_TABLET | Freq: Two times a day (BID) | ORAL | 5 refills | Status: DC | PRN

## 2021-09-12 MED ORDER — EMPAGLIFLOZIN 10 MG PO TABS: 10.0000 mg | ORAL_TABLET | Freq: Every day | ORAL | 3 refills | Status: DC

## 2021-09-12 NOTE — Progress Notes (Signed)
Established Patient Office Visit  Subjective:  [name], female    DOB: 1950-11-05  Age: 71 y.o. MRN: [id number]  CC:  Chief Complaint  Patient presents with   Medical Management of Chronic Issues   Hypertension   Anxiety    HPI  1. HTN Complaint with meds - Yes Current Medications - Coreg and Entresto Checking BP at home: no Exercising Regularly - No Watching Salt intake - No Pertinent ROS:  Headache - No Fatigue - No Visual Disturbances - No Chest pain - No Dyspnea - No Palpitations - No LE edema - No They report good compliance with medications and can restate their regimen by memory. No medication side effects.   2. Depression/anxiety On cymbalta and ativan.     09/12/2021    9:13 AM 08/27/2021    4:32 PM 07/18/2021   11:11 AM  Depression screen PHQ 2/9  Decreased Interest 0 0 0  Down, Depressed, Hopeless 0 0 0  PHQ - 2 Score 0 0 0  Altered sleeping 0 0   Tired, decreased energy 0 0 0  Change in appetite 0 0 0  Feeling bad or failure about yourself  0 0 0  Trouble concentrating 0 0 0  Moving slowly or fidgety/restless 0 0 0  Suicidal thoughts 0 0 0  PHQ-9 Score 0 0   Difficult doing work/chores Not difficult at all Not difficult at all Not difficult at all      09/12/2021    9:13 AM 08/27/2021    4:33 PM 07/18/2021   11:11 AM 07/03/2021    9:39 AM  GAD 7 : Generalized Anxiety Score  Nervous, Anxious, on Edge 0 0 0 0  Control/stop worrying 0 0 0 0  Worry too much - different things 0 0 0 0  Trouble relaxing 0 0 0 0  Restless 0 0 0 0  Easily annoyed or irritable 0 0 0 0  Afraid - awful might happen 0 0 0 0  Total GAD 7 Score 0 0 0 0  Anxiety Difficulty Not difficult at all Not difficult at all  Not difficult at all   3. GERD Saw GI last month. Protonix was increased to BID. Reports improving symptoms.   4. Cirrhosis Plans to have labs and repeat US with GI next month.   5. Hear failure Saw cardiology recently. Reports  some stable dyspnea with exertion. On coreg, entrestro, lasix. They plan to do a repeat echo in 6 months.   Past Medical History:  Diagnosis Date   Asthma    Carotid artery disease ([place])    Diverticulosis of colon (without mention of hemorrhage)    Essential hypertension    Functional ovarian cysts    GERD (gastroesophageal reflux disease)    Hemorrhoids, external, without mention of complication    Hormone replacement therapy (postmenopausal)    IBS (irritable bowel syndrome)    Migraine    Mitral valve disease    Right knee DJD    Seizures (HCC)    Sleep apnea    TIA (transient ischemic attack)    Type 2 diabetes mellitus ([place])    Type 2 diabetes mellitus with hyperglycemia, without long-term current use of insulin ([place]) 08/07/2017    Past Surgical History:  Procedure Laterality Date   BIOPSY  09/23/2018   Procedure: BIOPSY;  Surgeon: Rogene Houston, MD;  [facility];  Service: Endoscopy;;  duodeneum and stomach   CHOLECYSTECTOMY  1980  ENDARTERECTOMY Right 07/01/2017   Procedure: ENDARTERECTOMY CAROTID RIGHT;  Surgeon: Serafina Mitchell, MD;  Location: [facility] OR;  Service: Vascular;  Laterality: Right;   ESOPHAGOGASTRODUODENOSCOPY N/A 09/23/2018   Procedure: ESOPHAGOGASTRODUODENOSCOPY (EGD);  Surgeon: Rogene Houston, MD;  [facility];  Service: Endoscopy;  Laterality: N/A;  2:45-moved to 1:45 per Lelon Frohlich   ESOPHAGOGASTRODUODENOSCOPY (EGD) WITH PROPOFOL N/A 03/07/2021   Procedure: ESOPHAGOGASTRODUODENOSCOPY (EGD) WITH PROPOFOL;  Surgeon: Rogene Houston, MD;  [facility];  Service: Endoscopy;  Laterality: N/A;  12:50   LEFT HEART CATHETERIZATION WITH CORONARY ANGIOGRAM N/A 11/18/2013   Procedure: LEFT HEART CATHETERIZATION WITH CORONARY ANGIOGRAM;  Surgeon: Burnell Blanks, MD;  Location: [facility] CATH LAB;  Service: Cardiovascular;  Laterality: N/A;   OOPHORECTOMY     2012 Dr. Adah Perl in [place] Right 07/01/2017   Procedure: RIGHT  CAROTID PATCH ANGIOPLASTY;  Surgeon: Serafina Mitchell, MD;  Location: [place];  Service: Vascular;  Laterality: Right;   TONSILLECTOMY     TOTAL KNEE ARTHROPLASTY Right 09/02/2012   Procedure: TOTAL KNEE ARTHROPLASTY- right;  Surgeon: Ninetta Lights, MD;  Location: [place];  Service: Orthopedics;  Laterality: Right;   TUBAL LIGATION     TYMPANOMASTOIDECTOMY Left 04/02/2016   Procedure: LEFT CANAL WALL DOWN TYMPANOMASTOIDECTOMY;  Surgeon: Leta Baptist, MD;  Location: [place];  Service: ENT;  Laterality: Left;   VAGINAL HYSTERECTOMY  1990   WOUND EXPLORATION Right 07/02/2017   Procedure: RIGHT NECK EXPLORATION WITH INTRAOPERATIVE ULTRASOUND;  Surgeon: Angelia Mould, MD;  [facility];  Service: Vascular;  Laterality: Right;    Family History  Problem Relation Age of Onset   Heart disease Mother    Diabetes Mother    Heart disease Brother    Cancer Sister    Heart disease Brother    Heart disease Brother    Heart disease Son     Social History   Socioeconomic History   Marital status: Widowed    Spouse name: Not on file   Number of children: 3   Years of education: Not on file   Highest education level: Not on file  Occupational History   Occupation: retired  Tobacco Use   Smoking status: Never   Smokeless tobacco: Never  Vaping Use   Vaping Use: Never used  Substance and Sexual Activity   Alcohol use: No    Alcohol/week: 0.0 standard drinks of alcohol   Drug use: No   Sexual activity: Not on file  Other Topics Concern   Not on file  Social History Narrative   Lives alone. Granddaughter lives across the street, her 3 children all live nearby   Social Determinants of Health   Financial Resource Strain: Medium Risk (07/11/2021)   Overall Financial Resource Strain (CARDIA)    Difficulty of Paying Living Expenses: Somewhat hard  Food Insecurity: Food Insecurity Present (07/05/2021)   Hunger Vital Sign    Worried About Running Out of Food in the Last  Year: Sometimes true    Ran Out of Food in the Last Year: Never true  Transportation Needs: No Transportation Needs (07/05/2021)   PRAPARE - Hydrologist (Medical): No    Lack of Transportation (Non-Medical): No  Physical Activity: Insufficiently Active (07/05/2021)   Exercise Vital Sign    Days of Exercise per Week: 7 days    Minutes of Exercise per Session: 20 min  Stress: No Stress Concern Present (07/05/2021)  [employer] - Occupational Stress Questionnaire    Feeling of Stress : Not at all  Social Connections: Moderately Integrated (07/05/2021)   Social Connection and Isolation Panel [NHANES]    Frequency of Communication with Friends and Family: More than three times a week    Frequency of Social Gatherings with Friends and Family: More than three times a week    Attends Religious Services: More than 4 times per year    Active Member of [employer] or Organizations: Yes    Attends Archivist Meetings: More than 4 times per year    Marital Status: Widowed  Intimate Partner Violence: Not At Risk (07/05/2021)   Humiliation, Afraid, Rape, and Kick questionnaire    Fear of Current or Ex-Partner: No    Emotionally Abused: No    Physically Abused: No    Sexually Abused: No    Outpatient Medications Prior to Visit  Medication Sig Dispense Refill   acetaminophen (TYLENOL) 500 MG tablet Take 500 mg by mouth every 6 (six) hours as needed for mild pain.     albuterol (VENTOLIN HFA) 108 (90 Base) MCG/ACT inhaler INHALE 2 PUFFS INTO THE LUNGS EVERY 4 HOURS AS NEEDED FOR SHORTNESS OF BREATH. 18 g 1   aspirin EC 81 MG tablet Take 1 tablet (81 mg total) by mouth daily.     baclofen (LIORESAL) 10 MG tablet Take 1 tablet (10 mg total) by mouth 3 (three) times daily. 30 each 2   carvedilol (COREG) 3.125 MG tablet TAKE 1 TABLET BY MOUTH 2 TIMES A DAY WITH MEALS. 60 tablet 2   cholecalciferol (VITAMIN D) 1000 UNITS tablet Take 1,000 Units  by mouth daily.     DULoxetine (CYMBALTA) 60 MG capsule TAKE 1 CAPSULE BY MOUTH ONCE DAILY. 30 capsule 2   fluticasone (FLONASE) 50 MCG/ACT nasal spray Place 2 sprays into both nostrils daily. 16 g 6   fluticasone (FLOVENT HFA) 44 MCG/ACT inhaler Inhale 2 puffs into the lungs 2 (two) times daily. 1 each 12   furosemide (LASIX) 40 MG tablet Take 1 tablet (40 mg total) by mouth daily. 90 tablet 1   gabapentin (NEURONTIN) 300 MG capsule Take 300 mg by mouth 3 (three) times daily as needed (pain).     HYDROcodone-acetaminophen (NORCO) 10-325 MG tablet hydrocodone 10 mg-acetaminophen 325 mg tablet  Take 1 tablet 3 times a day by oral route as needed for pain for 30 days.     lactulose (CHRONULAC) 10 GM/15ML solution TAKE 30 MLS (20 GRAM TOTAL) BY MOUTH 3 TIMES A DAY. 2700 mL 5   lamoTRIgine (LAMICTAL) 200 MG tablet Take 1 tablet (200 mg total) by mouth 2 (two) times daily. 180 tablet 3   levocetirizine (XYZAL) 5 MG tablet Take 1 tablet (5 mg total) by mouth every evening. 30 tablet 1   LORazepam (ATIVAN) 0.5 MG tablet Take 1 tablet (0.5 mg total) by mouth 2 (two) times daily as needed for anxiety. 30 tablet 5   melatonin 5 MG TABS Take 5 mg by mouth at bedtime.     naloxone (NARCAN) nasal spray 4 mg/0.1 mL Place into the nose once.     ondansetron (ZOFRAN) 4 MG tablet Take 1 tablet (4 mg total) by mouth every 8 (eight) hours as needed for nausea or vomiting. 20 tablet 2   pantoprazole (PROTONIX) 40 MG tablet Take 1 tablet (40 mg total) by mouth 2 (two) times daily. 180 tablet 0   potassium chloride SA (KLOR-CON M)  20 MEQ tablet Take 1 tablet (20 mEq total) by mouth daily. 90 tablet 3   PRESCRIPTION MEDICATION Zpack     sacubitril-valsartan (ENTRESTO) 24-26 MG Take 1 tablet by mouth 2 (two) times daily. 180 tablet 3   sucralfate (CARAFATE) 1 g tablet Take 1 tablet (1 g total) by mouth 4 (four) times daily -  with meals and at bedtime for 21 days. 84 tablet 0   No facility-administered medications  prior to visit.    No Known Allergies  ROS Review of Systems Negative unless specially indicated above in HPI.   Objective:    Physical Exam Vitals and nursing note reviewed.  Constitutional:      General: She is not in acute distress.    Appearance: She is not ill-appearing, toxic-appearing or diaphoretic.  HENT:     Head: Normocephalic and atraumatic.     Nose: Nose normal.  Eyes:     Conjunctiva/sclera: Conjunctivae normal.     Pupils: Pupils are equal, round, and reactive to light.  Cardiovascular:     Rate and Rhythm: Normal rate and regular rhythm.     Heart sounds: Normal heart sounds. No murmur heard. Pulmonary:     Effort: Pulmonary effort is normal. No respiratory distress.     Breath sounds: Normal breath sounds.  Abdominal:     General: Bowel sounds are normal. There is no distension.     Palpations: Abdomen is soft.     Tenderness: There is no abdominal tenderness. There is no guarding or rebound.  Musculoskeletal:     Right lower leg: No edema.     Left lower leg: No edema.  Skin:    General: Skin is warm and dry.  Neurological:     General: No focal deficit present.     Mental Status: She is alert and oriented to person, place, and time.  Psychiatric:        Mood and Affect: Mood normal.        Behavior: Behavior normal.    BP (!) 99/56   Pulse 76   Temp (!) 96.5 F (35.8 C) (Temporal)   Ht 5' 4"  (1.626 m)   Wt 178 lb 8 oz (81 kg)   SpO2 96%   BMI 30.64 kg/m  Wt Readings from Last 3 Encounters:  09/12/21 178 lb 8 oz (81 kg)  08/27/21 174 lb 8 oz (79.2 kg)  08/02/21 174 lb 6.4 oz (79.1 kg)     Health Maintenance Due  Topic Date Due   DEXA SCAN  Never done    There are no preventive care reminders to display for this patient.  Lab Results  Component Value Date   TSH 1.350 05/15/2021   Lab Results  Component Value Date   WBC 11.9 (H) 05/15/2021   HGB 13.4 05/15/2021   HCT 39.4 05/15/2021   MCV 86 05/15/2021   PLT 200 05/15/2021    Lab Results  Component Value Date   NA 137 05/15/2021   K 3.4 (L) 05/15/2021   CO2 28 05/15/2021   GLUCOSE 96 05/15/2021   BUN 14 05/15/2021   CREATININE 0.87 05/15/2021   BILITOT 0.3 05/15/2021   ALKPHOS 116 05/15/2021   AST 24 05/15/2021   ALT 16 05/15/2021   PROT 6.8 05/15/2021   ALBUMIN 4.3 05/15/2021   CALCIUM 9.1 05/15/2021   ANIONGAP 7 03/05/2021   EGFR 73 05/15/2021   Lab Results  Component Value Date   CHOL 201 (H) 05/15/2021   Lab  Results  Component Value Date   HDL 72 05/15/2021   Lab Results  Component Value Date   LDLCALC 106 (H) 05/15/2021   Lab Results  Component Value Date   TRIG 131 05/15/2021   Lab Results  Component Value Date   CHOLHDL 2.8 05/15/2021   Lab Results  Component Value Date   HGBA1C 6.4 (H) 05/15/2021      Assessment & Plan:   Catherene was seen today for medical management of chronic issues, hypertension and anxiety.  Diagnoses and all orders for this visit:  Type 2 diabetes mellitus with hyperglycemia, without long-term current use of insulin (HCC) A1c in 7.1 today, meeting the diagnosis for T2DM. She has tried metformin in the past. Start jardiance as below for a1c control as well as CHF. Referral to CCM placed for T2DM education and medication assistance/management.  -     Bayer DCA Hb A1c Waived -     empagliflozin (JARDIANCE) 10 MG TABS tablet; Take 1 tablet (10 mg total) by mouth daily before breakfast. -     AMB Referral to [facility]  Primary hypertension Well controlled on current regimen.  -     CBC with Differential/Platelet -     CMP14+EGFR -     AMB Referral to [facility]  Mixed hyperlipidemia Labs pending.  -     Lipid panel -     AMB Referral to [facility]  Cirrhosis, nonalcoholic ([place]) Managed by GI. Liver US scheduled for next month.  -     AMB Referral to [facility]  Heart failure with reduced ejection fraction (HCC) Stable.  Managed by cardiology. Will start jardiance for T2DM with CHF.  -     empagliflozin (JARDIANCE) 10 MG TABS tablet; Take 1 tablet (10 mg total) by mouth daily before breakfast. -     AMB Referral to [facility]  Generalized anxiety disorder Benzodiazepine dependence ([place]) Controlled substance agreement signed PDMP reviewed, no red flags. CSA and UDS are UTD. Refills provided on ativan. Continue cymbalta as well.  -     LORazepam (ATIVAN) 0.5 MG tablet; Take 1 tablet (0.5 mg total) by mouth 2 (two) times daily as needed for anxiety.   Follow-up: Return in about 3 months (around 12/13/2021) for chronic follow up.   The patient indicates understanding of these issues and agrees with the plan.   Gwenlyn Perking, FNP

## 2021-09-19 ENCOUNTER — Telehealth: Payer: Self-pay

## 2021-09-19 NOTE — Chronic Care Management (AMB) (Unsigned)
  Chronic Care Management   Outreach Note  09/19/2021 Name: Carol Richmond MRN: [id number] DOB: 12-12-1950  Carol Richmond is a 71 y.o. year old female who is a primary care patient of Gwenlyn Perking, FNP. I reached out to Carol Richmond by phone today in response to a referral sent by Carol Richmond's primary care provider.  An unsuccessful telephone outreach was attempted today. The patient was referred to the case management team for assistance with care management and care coordination.   Follow Up Plan: The care management team will reach out to the patient again over the next 7 days.  If patient returns call to provider office, please advise to call [place] * at [phone]*  Noreene Larsson, [place], [place] [address] Direct Dial: [phone] [email or web address]

## 2021-09-20 ENCOUNTER — Encounter (INDEPENDENT_AMBULATORY_CARE_PROVIDER_SITE_OTHER): Payer: Self-pay | Admitting: Ophthalmology

## 2021-09-20 ENCOUNTER — Ambulatory Visit (INDEPENDENT_AMBULATORY_CARE_PROVIDER_SITE_OTHER): Payer: Medicare HMO | Admitting: Ophthalmology

## 2021-09-20 DIAGNOSIS — H348112 Central retinal vein occlusion, right eye, stable: Secondary | ICD-10-CM

## 2021-09-20 DIAGNOSIS — H43812 Vitreous degeneration, left eye: Secondary | ICD-10-CM

## 2021-09-20 DIAGNOSIS — H35033 Hypertensive retinopathy, bilateral: Secondary | ICD-10-CM

## 2021-09-20 DIAGNOSIS — H2512 Age-related nuclear cataract, left eye: Secondary | ICD-10-CM

## 2021-09-20 DIAGNOSIS — E113392 Type 2 diabetes mellitus with moderate nonproliferative diabetic retinopathy without macular edema, left eye: Secondary | ICD-10-CM

## 2021-09-20 NOTE — Assessment & Plan Note (Signed)
The nature of cataract was discussed with the patient as well as the elective nature of surgery. The patient was reassured that surgery at a later date does not put the patient at risk for a worse outcome. It was emphasized that the need for surgery is dictated by the patient's quality of life as influenced by the cataract. Patient was instructed to maintain close follow up with their general eye care doctor.

## 2021-09-20 NOTE — Assessment & Plan Note (Signed)
The nature of hypertensive retinopathy was explained to the patient with the analogy of high water pressure running through damaged pipes. The extreme importance of proper blood pressure control was explained to the patient. Patient voices understanding, and is encouraged to daily monitoring, and followup with PCP.

## 2021-09-20 NOTE — Chronic Care Management (AMB) (Signed)
  Chronic Care Management   Note  09/20/2021 Name: Carol Richmond MRN: [id number] DOB: 1950/08/02  Carol Richmond is a 71 y.o. year old female who is a primary care patient of Gwenlyn Perking, FNP. I reached out to [name] by phone today in response to a referral sent by [name]'s PCP.  [name] was given information about Chronic Care Management services today including:  CCM service includes personalized support from designated clinical staff supervised by her physician, including individualized plan of care and coordination with other care providers 24/7 contact phone numbers for assistance for urgent and routine care needs. Service will only be billed when office clinical staff spend 20 minutes or more in a month to coordinate care. Only one practitioner may furnish and bill the service in a calendar month. The patient may stop CCM services at any time (effective at the end of the month) by phone call to the office staff. The patient is responsible for co-pay (up to 20% after annual deductible is met) if co-pay is required by the individual health plan.   Patient agreed to services and verbal consent obtained.   Follow up plan: Face to Face appointment with care management team member scheduled for: 10/23/2021  Noreene Larsson, [place], [place] [address] Direct Dial: [phone] [email or web address]

## 2021-09-20 NOTE — Assessment & Plan Note (Signed)
The nature of posterior vitreous detachment was discussed with the patient as well as its physiology, its age prevalence, and its possible implication regarding retinal breaks and detachment.  An informational brochure was offered to the patient.  All the patient's questions were answered.  The patient was asked to return if new or different flashes or floaters develops.   Patient was instructed to contact office immediately if any new changes were noticed. I explained to the patient that vitreous inside the eye is similar to jello inside a bowl. As the jello melts it can start to pull away from the bowl, similarly the vitreous throughout our lives can begin to pull away from the retina. That process is called a posterior vitreous detachment. In some cases, the vitreous can tug hard enough on the retina to form a retinal tear. I discussed with the patient the signs and symptoms of a retinal detachment.  Do not rub the eye.  No retinal breaks with careful examination to the ora serrata with 25 D, 20 D as well as 90 D examinations

## 2021-09-20 NOTE — Assessment & Plan Note (Signed)
No active no reactivation of CRVO OD.  Good PRP peripherally.  Normal-looking perfusion.

## 2021-09-20 NOTE — Progress Notes (Signed)
09/20/2021     CHIEF COMPLAINT Patient presents for  Chief Complaint  Patient presents with   Retina Evaluation      HISTORY OF PRESENT ILLNESS: Carol Richmond is a 71 y.o. female who presents to the clinic today for:   HPI     Retina Evaluation           Laterality: left eye   Associated Symptoms: Negative for Flashes, Floaters, Distortion, Blind Spot, Pain, Redness, Photophobia, Glare, Trauma, Scalp Tenderness, Jaw Claudication, Shoulder/Hip pain, Fever, Weight Loss and Fatigue         Comments   NP- retinal hemorrhage OS, new  Ref by Dr. Rosana Hoes. "I was driving one day and I noticed a black spot in my vision. So I went to Dr. Rosana Hoes said that she didn't see anything at first. And then she checked again about 4 more times and said that I'm bleeding behind my eyeball. My vision is okay but its just that one black spot." Pt expressed no concerns with her vision.         Last edited by Silvestre Moment on 09/20/2021  8:06 AM.      Referring physician: Leticia Clas, [place]. [address] [place],  [place] [address]  HISTORICAL INFORMATION:   Selected notes from the MEDICAL RECORD NUMBER    Lab Results  Component Value Date   HGBA1C 7.1 (H) 09/12/2021     CURRENT MEDICATIONS: No current outpatient medications on file. (Ophthalmic Drugs)   No current facility-administered medications for this visit. (Ophthalmic Drugs)   Current Outpatient Medications (Other)  Medication Sig   acetaminophen (TYLENOL) 500 MG tablet Take 500 mg by mouth every 6 (six) hours as needed for mild pain.   albuterol (VENTOLIN HFA) 108 (90 Base) MCG/ACT inhaler INHALE 2 PUFFS INTO THE LUNGS EVERY 4 HOURS AS NEEDED FOR SHORTNESS OF BREATH.   aspirin EC 81 MG tablet Take 1 tablet (81 mg total) by mouth daily.   baclofen (LIORESAL) 10 MG tablet Take 1 tablet (10 mg total) by mouth 3 (three) times daily.   carvedilol (COREG) 3.125 MG tablet TAKE 1 TABLET BY MOUTH 2 TIMES A DAY WITH MEALS.    cholecalciferol (VITAMIN D) 1000 UNITS tablet Take 1,000 Units by mouth daily.   DULoxetine (CYMBALTA) 60 MG capsule TAKE 1 CAPSULE BY MOUTH ONCE DAILY.   empagliflozin (JARDIANCE) 10 MG TABS tablet Take 1 tablet (10 mg total) by mouth daily before breakfast.   fluticasone (FLONASE) 50 MCG/ACT nasal spray Place 2 sprays into both nostrils daily.   fluticasone (FLOVENT HFA) 44 MCG/ACT inhaler Inhale 2 puffs into the lungs 2 (two) times daily.   furosemide (LASIX) 40 MG tablet Take 1 tablet (40 mg total) by mouth daily.   gabapentin (NEURONTIN) 300 MG capsule Take 300 mg by mouth 3 (three) times daily as needed (pain).   HYDROcodone-acetaminophen (NORCO) 10-325 MG tablet hydrocodone 10 mg-acetaminophen 325 mg tablet  Take 1 tablet 3 times a day by oral route as needed for pain for 30 days.   lactulose (CHRONULAC) 10 GM/15ML solution TAKE 30 MLS (20 GRAM TOTAL) BY MOUTH 3 TIMES A DAY.   lamoTRIgine (LAMICTAL) 200 MG tablet Take 1 tablet (200 mg total) by mouth 2 (two) times daily.   levocetirizine (XYZAL) 5 MG tablet Take 1 tablet (5 mg total) by mouth every evening.   LORazepam (ATIVAN) 0.5 MG tablet Take 1 tablet (0.5 mg total) by mouth 2 (two) times daily  as needed for anxiety.   melatonin 5 MG TABS Take 5 mg by mouth at bedtime.   naloxone (NARCAN) nasal spray 4 mg/0.1 mL Place into the nose once.   ondansetron (ZOFRAN) 4 MG tablet Take 1 tablet (4 mg total) by mouth every 8 (eight) hours as needed for nausea or vomiting.   pantoprazole (PROTONIX) 40 MG tablet Take 1 tablet (40 mg total) by mouth 2 (two) times daily.   potassium chloride SA (KLOR-CON M) 20 MEQ tablet Take 1 tablet (20 mEq total) by mouth daily.   PRESCRIPTION MEDICATION Zpack   sacubitril-valsartan (ENTRESTO) 24-26 MG Take 1 tablet by mouth 2 (two) times daily.   sucralfate (CARAFATE) 1 g tablet Take 1 tablet (1 g total) by mouth 4 (four) times daily -  with meals and at bedtime for 21 days.   No current facility-administered  medications for this visit. (Other)      REVIEW OF SYSTEMS: ROS   Negative for: Constitutional, Gastrointestinal, Neurological, Skin, Genitourinary, Musculoskeletal, HENT, Endocrine, Cardiovascular, Eyes, Respiratory, Psychiatric, Allergic/Imm, Heme/Lymph Last edited by Silvestre Moment on 09/20/2021  8:02 AM.       ALLERGIES No Known Allergies  PAST MEDICAL HISTORY Past Medical History:  Diagnosis Date   Asthma    Carotid artery disease ([place])    Diverticulosis of colon (without mention of hemorrhage)    Essential hypertension    Functional ovarian cysts    GERD (gastroesophageal reflux disease)    Hemorrhoids, external, without mention of complication    Hormone replacement therapy (postmenopausal)    IBS (irritable bowel syndrome)    Migraine    Mitral valve disease    Right knee DJD    Seizures (HCC)    Sleep apnea    TIA (transient ischemic attack)    Type 2 diabetes mellitus ([place])    Type 2 diabetes mellitus with hyperglycemia, without long-term current use of insulin ([place]) 08/07/2017   Past Surgical History:  Procedure Laterality Date   BIOPSY  09/23/2018   Procedure: BIOPSY;  Surgeon: Rogene Houston, MD;  [facility]: [facility];  Service: Endoscopy;;  duodeneum and stomach   CHOLECYSTECTOMY  1980   ENDARTERECTOMY Right 07/01/2017   Procedure: ENDARTERECTOMY CAROTID RIGHT;  Surgeon: Serafina Mitchell, MD;  Location: [facility] OR;  Service: Vascular;  Laterality: Right;   ESOPHAGOGASTRODUODENOSCOPY N/A 09/23/2018   Procedure: ESOPHAGOGASTRODUODENOSCOPY (EGD);  Surgeon: Rogene Houston, MD;  [facility]: [facility];  Service: Endoscopy;  Laterality: N/A;  2:45-moved to 1:45 per Lelon Frohlich   ESOPHAGOGASTRODUODENOSCOPY (EGD) WITH PROPOFOL N/A 03/07/2021   Procedure: ESOPHAGOGASTRODUODENOSCOPY (EGD) WITH PROPOFOL;  Surgeon: Rogene Houston, MD;  [facility];  Service: Endoscopy;  Laterality: N/A;  12:50   LEFT HEART CATHETERIZATION WITH CORONARY ANGIOGRAM N/A 11/18/2013   Procedure:  LEFT HEART CATHETERIZATION WITH CORONARY ANGIOGRAM;  Surgeon: Burnell Blanks, MD;  Location: [facility] CATH LAB;  Service: Cardiovascular;  Laterality: N/A;   OOPHORECTOMY     2012 Dr. Adah Perl in [place] Right 07/01/2017   Procedure: RIGHT CAROTID PATCH ANGIOPLASTY;  Surgeon: Serafina Mitchell, MD;  Location: [facility];  Service: Vascular;  Laterality: Right;   TONSILLECTOMY     TOTAL KNEE ARTHROPLASTY Right 09/02/2012   Procedure: TOTAL KNEE ARTHROPLASTY- right;  Surgeon: Ninetta Lights, MD;  Location: [facility];  Service: Orthopedics;  Laterality: Right;   TUBAL LIGATION     TYMPANOMASTOIDECTOMY Left 04/02/2016   Procedure: LEFT CANAL WALL DOWN TYMPANOMASTOIDECTOMY;  Surgeon: Kasandra Knudsen  Benjamine Mola, MD;  Location: [facility];  Service: ENT;  Laterality: Left;   [place] Right 07/02/2017   Procedure: RIGHT NECK EXPLORATION WITH INTRAOPERATIVE ULTRASOUND;  Surgeon: Angelia Mould, MD;  Location[facility];  Service: Vascular;  Laterality: Right;    FAMILY HISTORY Family History  Problem Relation Age of Onset   Heart disease Mother    Diabetes Mother    Heart disease Brother    Cancer Sister    Heart disease Brother    Heart disease Brother    Heart disease Son     SOCIAL HISTORY Social History   Tobacco Use   Smoking status: Never   Smokeless tobacco: Never  Vaping Use   Vaping Use: Never used  Substance Use Topics   Alcohol use: No    Alcohol/week: 0.0 standard drinks of alcohol   Drug use: No         OPHTHALMIC EXAM:  Base Eye Exam     Visual Acuity (ETDRS)       Right Left   Dist cc 20/50 -2 20/60 -1   Dist ph cc 20/40 -2 20/40 -2    Correction: Glasses         Tonometry (Tonopen, 8:10 AM)       Right Left   Pressure 14 15         Pupils       Pupils Dark Light Shape React APD   Right PERRL 4 3 Round Slow None   Left PERRL 4 3 Round Slow None         Visual Fields       Left Right     Full Full         Extraocular Movement       Right Left    Full, Ortho Full, Ortho         Neuro/Psych     Oriented x3: Yes         Dilation     Both eyes: 1.0% Mydriacyl, 2.5% Phenylephrine @ 8:10 AM           Slit Lamp and Fundus Exam     External Exam       Right Left   External Normal Normal         Slit Lamp Exam       Right Left   Lids/Lashes Normal Normal   Conjunctiva/Sclera White and quiet White and quiet   Cornea Clear Clear   Anterior Chamber Deep and quiet Deep and quiet   Iris Round and reactive Round and reactive   Lens Centered posterior chamber intraocular lens 1+ Nuclear sclerosis   Anterior Vitreous Normal Normal         Fundus Exam       Right Left   Posterior Vitreous Posterior vitreous detachment Posterior vitreous detachment   Disc Normal Normal   C/D Ratio 0.1 0.1   Macula Normal Normal   Vessels Compensated old CRVO.  No active hemorrhages no . enlargement of vessels, AV crossing changes of mild old hypertensive retinopathy Mild AV crossing changes of hypertensive retinopathy   Periphery Good peripheral PRP no holes or tears, 25 D, 20 D and 90 D examinations, PVD            IMAGING AND PROCEDURES  Imaging and Procedures for 09/20/21  OCT, Retina - OU - Both Eyes       Right Eye Quality was good. Scan  locations included subfoveal. Central Foveal Thickness: 276. Progression has been stable. Findings include normal foveal contour.   Left Eye Quality was good. Scan locations included subfoveal. Central Foveal Thickness: 283. Progression has been stable. Findings include normal foveal contour.   Notes No active maculopathy incidental PVD OU     Color Fundus Photography Optos - OU - Both Eyes       Right Eye Progression has been stable.   Left Eye Progression has been stable.   Notes Clear media OD.  Peripheral PRP for previous old ischemic CRV O now controlled compensated.  OS incidental PVD with only  moderate NPDR no active maculopathy or peripheral disease             ASSESSMENT/PLAN:  Posterior vitreous detachment of left eye  The nature of posterior vitreous detachment was discussed with the patient as well as its physiology, its age prevalence, and its possible implication regarding retinal breaks and detachment.  An informational brochure was offered to the patient.  All the patient's questions were answered.  The patient was asked to return if new or different flashes or floaters develops.   Patient was instructed to contact office immediately if any new changes were noticed. I explained to the patient that vitreous inside the eye is similar to jello inside a bowl. As the jello melts it can start to pull away from the bowl, similarly the vitreous throughout our lives can begin to pull away from the retina. That process is called a posterior vitreous detachment. In some cases, the vitreous can tug hard enough on the retina to form a retinal tear. I discussed with the patient the signs and symptoms of a retinal detachment.  Do not rub the eye.  No retinal breaks with careful examination to the ora serrata with 25 D, 20 D as well as 90 D examinations  Stable central retinal vein occlusion of right eye No active no reactivation of CRVO OD.  Good PRP peripherally.  Normal-looking perfusion.  Hypertensive retinopathy of both eyes, grade 1 The nature of hypertensive retinopathy was explained to the patient with the analogy of high water pressure running through damaged pipes. The extreme importance of proper blood pressure control was explained to the patient. Patient voices understanding, and is encouraged to daily monitoring, and followup with PCP.  Nuclear sclerotic cataract of left eye The nature of cataract was discussed with the patient as well as the elective nature of surgery. The patient was reassured that surgery at a later date does not put the patient at risk for a worse  outcome. It was emphasized that the need for surgery is dictated by the patient's quality of life as influenced by the cataract. Patient was instructed to maintain close follow up with their general eye care doctor.     ICD-10-CM   1. Moderate nonproliferative diabetic retinopathy of left eye without macular edema associated with type 2 diabetes mellitus (HCC)  K59.9357 OCT, Retina - OU - Both Eyes    Color Fundus Photography Optos - OU - Both Eyes    2. Posterior vitreous detachment of left eye  H43.812 OCT, Retina - OU - Both Eyes    Color Fundus Photography Optos - OU - Both Eyes    3. Stable central retinal vein occlusion of right eye  H34.8112 Color Fundus Photography Optos - OU - Both Eyes    4. Hypertensive retinopathy of both eyes, grade 1  H35.033     5. Nuclear sclerotic cataract  of left eye  H25.12       1.  OS with new onset PVD no complications.  Continue to monitor  2.  Patient instructed to notify the office promptly if new onset visual difficulties or symptoms or Dr. Leticia Clas.  3.  I do recommend consideration of follow-up with Dr. Rosana Hoes regarding arrangement of cataract surgery left eye  Ophthalmic Meds Ordered this visit:  No orders of the defined types were placed in this encounter.      Return in about 6 weeks (around 11/01/2021) for dilate, OS.  There are no Patient Instructions on file for this visit.   Explained the diagnoses, plan, and follow up with the patient and they expressed understanding.  Patient expressed understanding of the importance of proper follow up care.   Clent Demark Torey Reinard M.D. Diseases & Surgery of the Retina and Vitreous [facility] 09/20/21     Abbreviations: M myopia (nearsighted); A astigmatism; H hyperopia (farsighted); P presbyopia; Mrx spectacle prescription;  CTL contact lenses; OD right eye; OS left eye; OU both eyes  XT exotropia; ET esotropia; PEK punctate epithelial keratitis; PEE punctate  epithelial erosions; DES dry eye syndrome; MGD meibomian gland dysfunction; ATs artificial tears; PFAT's preservative free artificial tears; [place] nuclear sclerotic cataract; PSC posterior subcapsular cataract; ERM epi-retinal membrane; PVD posterior vitreous detachment; RD retinal detachment; DM diabetes mellitus; DR diabetic retinopathy; NPDR non-proliferative diabetic retinopathy; PDR proliferative diabetic retinopathy; CSME clinically significant macular edema; DME diabetic macular edema; dbh dot blot hemorrhages; CWS cotton wool spot; POAG primary open angle glaucoma; C/D cup-to-disc ratio; HVF humphrey visual field; GVF goldmann visual field; OCT optical coherence tomography; IOP intraocular pressure; BRVO Branch retinal vein occlusion; CRVO central retinal vein occlusion; CRAO central retinal artery occlusion; BRAO branch retinal artery occlusion; RT retinal tear; SB scleral buckle; PPV pars plana vitrectomy; VH Vitreous hemorrhage; PRP panretinal laser photocoagulation; IVK intravitreal kenalog; VMT vitreomacular traction; MH Macular hole;  NVD neovascularization of the disc; NVE neovascularization elsewhere; AREDS age related eye disease study; ARMD age related macular degeneration; POAG primary open angle glaucoma; EBMD epithelial/anterior basement membrane dystrophy; ACIOL anterior chamber intraocular lens; IOL intraocular lens; PCIOL posterior chamber intraocular lens; Phaco/IOL phacoemulsification with intraocular lens placement; [place] photorefractive keratectomy; LASIK laser assisted in situ keratomileusis; HTN hypertension; DM diabetes mellitus; COPD chronic obstructive pulmonary disease

## 2021-09-21 ENCOUNTER — Other Ambulatory Visit: Payer: Self-pay | Admitting: Family Medicine

## 2021-09-21 DIAGNOSIS — J301 Allergic rhinitis due to pollen: Secondary | ICD-10-CM

## 2021-09-27 ENCOUNTER — Ambulatory Visit (INDEPENDENT_AMBULATORY_CARE_PROVIDER_SITE_OTHER): Payer: Medicare HMO | Admitting: Gastroenterology

## 2021-09-28 ENCOUNTER — Other Ambulatory Visit: Payer: Self-pay

## 2021-09-28 ENCOUNTER — Inpatient Hospital Stay (HOSPITAL_COMMUNITY)
Admission: EM | Admit: 2021-09-28 | Discharge: 2021-09-30 | DRG: 309 | Disposition: A | Discharge status: Home / Self Care | Payer: Medicare HMO | Source: Ambulatory Visit | Attending: Internal Medicine | Admitting: Internal Medicine

## 2021-09-28 ENCOUNTER — Other Ambulatory Visit: Payer: Self-pay | Admitting: *Deleted

## 2021-09-28 ENCOUNTER — Encounter (HOSPITAL_COMMUNITY): Payer: Self-pay | Admitting: Internal Medicine

## 2021-09-28 ENCOUNTER — Emergency Department (HOSPITAL_COMMUNITY): Payer: Medicare HMO

## 2021-09-28 ENCOUNTER — Encounter: Payer: Self-pay | Admitting: Family Medicine

## 2021-09-28 ENCOUNTER — Ambulatory Visit (INDEPENDENT_AMBULATORY_CARE_PROVIDER_SITE_OTHER): Payer: Medicare HMO | Admitting: Family Medicine

## 2021-09-28 ENCOUNTER — Other Ambulatory Visit (HOSPITAL_COMMUNITY): Payer: Medicare HMO

## 2021-09-28 VITALS — BP 113/80 | HR 179 | Temp 97.0°F | Ht 64.0 in | Wt 175.2 lb

## 2021-09-28 DIAGNOSIS — R9431 Abnormal electrocardiogram [ECG] [EKG]: Secondary | ICD-10-CM | POA: Diagnosis not present

## 2021-09-28 DIAGNOSIS — Z9049 Acquired absence of other specified parts of digestive tract: Secondary | ICD-10-CM | POA: Diagnosis not present

## 2021-09-28 DIAGNOSIS — R000 Tachycardia, unspecified: Secondary | ICD-10-CM | POA: Diagnosis present

## 2021-09-28 DIAGNOSIS — I429 Cardiomyopathy, unspecified: Secondary | ICD-10-CM | POA: Diagnosis not present

## 2021-09-28 DIAGNOSIS — I10 Essential (primary) hypertension: Secondary | ICD-10-CM | POA: Diagnosis present

## 2021-09-28 DIAGNOSIS — I959 Hypotension, unspecified: Secondary | ICD-10-CM | POA: Diagnosis not present

## 2021-09-28 DIAGNOSIS — K219 Gastro-esophageal reflux disease without esophagitis: Secondary | ICD-10-CM | POA: Diagnosis present

## 2021-09-28 DIAGNOSIS — I2510 Atherosclerotic heart disease of native coronary artery without angina pectoris: Secondary | ICD-10-CM | POA: Diagnosis present

## 2021-09-28 DIAGNOSIS — G479 Sleep disorder, unspecified: Secondary | ICD-10-CM | POA: Diagnosis present

## 2021-09-28 DIAGNOSIS — R569 Unspecified convulsions: Secondary | ICD-10-CM | POA: Diagnosis not present

## 2021-09-28 DIAGNOSIS — R231 Pallor: Secondary | ICD-10-CM | POA: Diagnosis not present

## 2021-09-28 DIAGNOSIS — I342 Nonrheumatic mitral (valve) stenosis: Secondary | ICD-10-CM | POA: Diagnosis not present

## 2021-09-28 DIAGNOSIS — E113392 Type 2 diabetes mellitus with moderate nonproliferative diabetic retinopathy without macular edema, left eye: Secondary | ICD-10-CM | POA: Diagnosis not present

## 2021-09-28 DIAGNOSIS — R61 Generalized hyperhidrosis: Secondary | ICD-10-CM

## 2021-09-28 DIAGNOSIS — E876 Hypokalemia: Secondary | ICD-10-CM | POA: Diagnosis not present

## 2021-09-28 DIAGNOSIS — I5022 Chronic systolic (congestive) heart failure: Secondary | ICD-10-CM | POA: Diagnosis not present

## 2021-09-28 DIAGNOSIS — Z833 Family history of diabetes mellitus: Secondary | ICD-10-CM

## 2021-09-28 DIAGNOSIS — Z8673 Personal history of transient ischemic attack (TIA), and cerebral infarction without residual deficits: Secondary | ICD-10-CM

## 2021-09-28 DIAGNOSIS — M25519 Pain in unspecified shoulder: Secondary | ICD-10-CM | POA: Diagnosis not present

## 2021-09-28 DIAGNOSIS — Z90710 Acquired absence of both cervix and uterus: Secondary | ICD-10-CM

## 2021-09-28 DIAGNOSIS — Z8679 Personal history of other diseases of the circulatory system: Secondary | ICD-10-CM | POA: Diagnosis not present

## 2021-09-28 DIAGNOSIS — R0602 Shortness of breath: Secondary | ICD-10-CM | POA: Diagnosis not present

## 2021-09-28 DIAGNOSIS — Z8249 Family history of ischemic heart disease and other diseases of the circulatory system: Secondary | ICD-10-CM | POA: Diagnosis not present

## 2021-09-28 DIAGNOSIS — M25512 Pain in left shoulder: Secondary | ICD-10-CM | POA: Diagnosis present

## 2021-09-28 DIAGNOSIS — G4733 Obstructive sleep apnea (adult) (pediatric): Secondary | ICD-10-CM | POA: Diagnosis present

## 2021-09-28 DIAGNOSIS — K7460 Unspecified cirrhosis of liver: Secondary | ICD-10-CM | POA: Diagnosis not present

## 2021-09-28 DIAGNOSIS — I110 Hypertensive heart disease with heart failure: Secondary | ICD-10-CM | POA: Diagnosis present

## 2021-09-28 DIAGNOSIS — I517 Cardiomegaly: Secondary | ICD-10-CM | POA: Diagnosis not present

## 2021-09-28 DIAGNOSIS — I471 Supraventricular tachycardia: Secondary | ICD-10-CM | POA: Diagnosis not present

## 2021-09-28 DIAGNOSIS — I447 Left bundle-branch block, unspecified: Secondary | ICD-10-CM | POA: Diagnosis present

## 2021-09-28 DIAGNOSIS — R531 Weakness: Secondary | ICD-10-CM

## 2021-09-28 DIAGNOSIS — J45909 Unspecified asthma, uncomplicated: Secondary | ICD-10-CM | POA: Diagnosis not present

## 2021-09-28 DIAGNOSIS — K7581 Nonalcoholic steatohepatitis (NASH): Secondary | ICD-10-CM | POA: Diagnosis present

## 2021-09-28 DIAGNOSIS — I152 Hypertension secondary to endocrine disorders: Secondary | ICD-10-CM | POA: Diagnosis present

## 2021-09-28 DIAGNOSIS — I052 Rheumatic mitral stenosis with insufficiency: Secondary | ICD-10-CM | POA: Diagnosis not present

## 2021-09-28 DIAGNOSIS — E1165 Type 2 diabetes mellitus with hyperglycemia: Secondary | ICD-10-CM | POA: Diagnosis present

## 2021-09-28 DIAGNOSIS — Z96651 Presence of right artificial knee joint: Secondary | ICD-10-CM | POA: Diagnosis present

## 2021-09-28 LAB — CBC
HCT: 41.5 % (ref 36.0–46.0)
Hemoglobin: 13.9 g/dL (ref 12.0–15.0)
MCH: 29.3 pg (ref 26.0–34.0)
MCHC: 33.5 g/dL (ref 30.0–36.0)
MCV: 87.6 fL (ref 80.0–100.0)
Platelets: 232 10*3/uL (ref 150–400)
RBC: 4.74 MIL/uL (ref 3.87–5.11)
RDW: 14.5 % (ref 11.5–15.5)
WBC: 11.6 10*3/uL — ABNORMAL HIGH (ref 4.0–10.5)
nRBC: 0 % (ref 0.0–0.2)

## 2021-09-28 LAB — GLUCOSE, CAPILLARY
Glucose-Capillary: 140 mg/dL — ABNORMAL HIGH (ref 70–99)
Glucose-Capillary: 160 mg/dL — ABNORMAL HIGH (ref 70–99)

## 2021-09-28 LAB — BASIC METABOLIC PANEL
Anion gap: 10 (ref 5–15)
BUN: 14 mg/dL (ref 8–23)
CO2: 23 mmol/L (ref 22–32)
Calcium: 9.4 mg/dL (ref 8.9–10.3)
Chloride: 109 mmol/L (ref 98–111)
Creatinine, Ser: 0.97 mg/dL (ref 0.44–1.00)
GFR, Estimated: >60 mL/min (ref >60)
Glucose, Bld: 249 mg/dL — ABNORMAL HIGH (ref 70–99)
Potassium: 3.4 mmol/L — ABNORMAL LOW (ref 3.5–5.1)
Sodium: 142 mmol/L (ref 135–145)

## 2021-09-28 LAB — BRAIN NATRIURETIC PEPTIDE: B Natriuretic Peptide: 1025 pg/mL — ABNORMAL HIGH (ref 0.0–100.0)

## 2021-09-28 LAB — TSH: TSH: 0.744 u[IU]/mL (ref 0.350–4.500)

## 2021-09-28 LAB — TROPONIN I (HIGH SENSITIVITY)
Troponin I (High Sensitivity): 32 ng/L — ABNORMAL HIGH (ref <18)
Troponin I (High Sensitivity): 31 ng/L — ABNORMAL HIGH (ref <18)

## 2021-09-28 LAB — MAGNESIUM: Magnesium: 2.1 mg/dL (ref 1.7–2.4)

## 2021-09-28 MED ORDER — ZOLPIDEM TARTRATE 5 MG PO TABS
5.0000 mg | ORAL_TABLET | Freq: Every evening | ORAL | Status: AC | PRN
  Administered 2021-09-28 – 2021-09-29 (×2): 5 mg via ORAL
  Filled 2021-09-28 (×2): qty 1

## 2021-09-28 MED ORDER — DULOXETINE HCL 60 MG PO CPEP
60.0000 mg | ORAL_CAPSULE | Freq: Every day | ORAL | Status: DC
  Administered 2021-09-29 – 2021-09-30 (×2): 60 mg via ORAL
  Filled 2021-09-28: qty 2
  Filled 2021-09-28: qty 1

## 2021-09-28 MED ORDER — ONDANSETRON HCL 4 MG PO TABS: 4.0000 mg | ORAL_TABLET | Freq: Four times a day (QID) | ORAL | Status: DC | PRN

## 2021-09-28 MED ORDER — POTASSIUM CHLORIDE CRYS ER 20 MEQ PO TBCR
40.0000 meq | EXTENDED_RELEASE_TABLET | Freq: Once | ORAL | Status: AC
  Administered 2021-09-28: 40 meq via ORAL
  Filled 2021-09-28: qty 2

## 2021-09-28 MED ORDER — ACETAMINOPHEN 650 MG RE SUPP: 650.0000 mg | Freq: Four times a day (QID) | RECTAL | Status: DC | PRN

## 2021-09-28 MED ORDER — POTASSIUM CHLORIDE CRYS ER 20 MEQ PO TBCR
20.0000 meq | EXTENDED_RELEASE_TABLET | Freq: Every day | ORAL | Status: DC
  Administered 2021-09-28 – 2021-09-30 (×3): 20 meq via ORAL
  Filled 2021-09-28 (×3): qty 1

## 2021-09-28 MED ORDER — ACETAMINOPHEN 325 MG PO TABS
650.0000 mg | ORAL_TABLET | Freq: Four times a day (QID) | ORAL | Status: DC | PRN
  Administered 2021-09-28 – 2021-09-29 (×2): 650 mg via ORAL
  Filled 2021-09-28 (×2): qty 2

## 2021-09-28 MED ORDER — SACUBITRIL-VALSARTAN 24-26 MG PO TABS
1.0000 | ORAL_TABLET | Freq: Two times a day (BID) | ORAL | Status: DC
  Administered 2021-09-28 – 2021-09-30 (×4): 1 via ORAL
  Filled 2021-09-28 (×4): qty 1

## 2021-09-28 MED ORDER — AMIODARONE LOAD VIA INFUSION
150.0000 mg | Freq: Once | INTRAVENOUS | Status: AC
  Administered 2021-09-28: 150 mg via INTRAVENOUS
  Filled 2021-09-28: qty 83.34

## 2021-09-28 MED ORDER — GABAPENTIN 300 MG PO CAPS: 300.0000 mg | ORAL_CAPSULE | Freq: Three times a day (TID) | ORAL | Status: DC | PRN

## 2021-09-28 MED ORDER — BUDESONIDE 0.25 MG/2ML IN SUSP
0.2500 mg | Freq: Two times a day (BID) | RESPIRATORY_TRACT | Status: DC
  Administered 2021-09-28 – 2021-09-30 (×4): 0.25 mg via RESPIRATORY_TRACT
  Filled 2021-09-28 (×4): qty 2

## 2021-09-28 MED ORDER — FLUTICASONE PROPIONATE HFA 44 MCG/ACT IN AERO: 2.0000 | INHALATION_SPRAY | Freq: Two times a day (BID) | RESPIRATORY_TRACT | Status: DC

## 2021-09-28 MED ORDER — ONDANSETRON HCL 4 MG/2ML IJ SOLN: 4.0000 mg | Freq: Four times a day (QID) | INTRAMUSCULAR | Status: DC | PRN

## 2021-09-28 MED ORDER — FLUTICASONE PROPIONATE 50 MCG/ACT NA SUSP
2.0000 | Freq: Every day | NASAL | Status: DC
  Administered 2021-09-29 – 2021-09-30 (×2): 2 via NASAL
  Filled 2021-09-28: qty 16

## 2021-09-28 MED ORDER — FUROSEMIDE 40 MG PO TABS
40.0000 mg | ORAL_TABLET | Freq: Every day | ORAL | Status: DC
  Administered 2021-09-29 – 2021-09-30 (×2): 40 mg via ORAL
  Filled 2021-09-28 (×2): qty 1

## 2021-09-28 MED ORDER — LORAZEPAM 0.5 MG PO TABS
0.5000 mg | ORAL_TABLET | Freq: Two times a day (BID) | ORAL | Status: DC | PRN
Start: 2021-09-28 — End: 2021-09-30

## 2021-09-28 MED ORDER — AMIODARONE HCL IN DEXTROSE 360-4.14 MG/200ML-% IV SOLN
60.0000 mg/h | INTRAVENOUS | Status: DC
  Administered 2021-09-28 (×2): 60 mg/h via INTRAVENOUS
  Filled 2021-09-28 (×3): qty 200

## 2021-09-28 MED ORDER — PANTOPRAZOLE SODIUM 40 MG PO TBEC
40.0000 mg | DELAYED_RELEASE_TABLET | Freq: Two times a day (BID) | ORAL | Status: DC
  Administered 2021-09-28 – 2021-09-30 (×4): 40 mg via ORAL
  Filled 2021-09-28 (×4): qty 1

## 2021-09-28 MED ORDER — MELATONIN 5 MG PO TABS: 5.0000 mg | ORAL_TABLET | Freq: Every day | ORAL | Status: DC

## 2021-09-28 MED ORDER — ENOXAPARIN SODIUM 40 MG/0.4ML IJ SOSY
40.0000 mg | PREFILLED_SYRINGE | INTRAMUSCULAR | Status: DC
  Administered 2021-09-28 – 2021-09-29 (×2): 40 mg via SUBCUTANEOUS
  Filled 2021-09-28 (×2): qty 0.4

## 2021-09-28 MED ORDER — LAMOTRIGINE 100 MG PO TABS
200.0000 mg | ORAL_TABLET | Freq: Two times a day (BID) | ORAL | Status: DC
  Administered 2021-09-28 – 2021-09-30 (×4): 200 mg via ORAL
  Filled 2021-09-28: qty 8
  Filled 2021-09-28: qty 2
  Filled 2021-09-28 (×4): qty 8
  Filled 2021-09-28: qty 2
  Filled 2021-09-28: qty 8

## 2021-09-28 MED ORDER — ALBUTEROL SULFATE HFA 108 (90 BASE) MCG/ACT IN AERS: 2.0000 | INHALATION_SPRAY | RESPIRATORY_TRACT | Status: DC | PRN

## 2021-09-28 MED ORDER — INSULIN ASPART 100 UNIT/ML IJ SOLN
0.0000 [IU] | Freq: Three times a day (TID) | INTRAMUSCULAR | Status: DC
  Administered 2021-09-28: 1 [IU] via SUBCUTANEOUS
  Administered 2021-09-29: 2 [IU] via SUBCUTANEOUS
  Administered 2021-09-29: 1 [IU] via SUBCUTANEOUS
  Administered 2021-09-29: 3 [IU] via SUBCUTANEOUS
  Administered 2021-09-30: 1 [IU] via SUBCUTANEOUS
  Administered 2021-09-30: 2 [IU] via SUBCUTANEOUS

## 2021-09-28 MED ORDER — VITAMIN D 25 MCG (1000 UNIT) PO TABS
1000.0000 [IU] | ORAL_TABLET | Freq: Every day | ORAL | Status: DC
  Administered 2021-09-29 – 2021-09-30 (×2): 1000 [IU] via ORAL
  Filled 2021-09-28 (×2): qty 1

## 2021-09-28 MED ORDER — AMIODARONE HCL IN DEXTROSE 360-4.14 MG/200ML-% IV SOLN
30.0000 mg/h | INTRAVENOUS | Status: DC
  Administered 2021-09-28 – 2021-09-29 (×3): 30 mg/h via INTRAVENOUS
  Filled 2021-09-28: qty 200

## 2021-09-28 MED ORDER — EMPAGLIFLOZIN 10 MG PO TABS
10.0000 mg | ORAL_TABLET | Freq: Every day | ORAL | Status: DC
  Administered 2021-09-29 – 2021-09-30 (×2): 10 mg via ORAL
  Filled 2021-09-28 (×4): qty 1

## 2021-09-28 MED ORDER — ASPIRIN 81 MG PO TBEC
81.0000 mg | DELAYED_RELEASE_TABLET | Freq: Every day | ORAL | Status: DC
  Administered 2021-09-29 – 2021-09-30 (×2): 81 mg via ORAL
  Filled 2021-09-28 (×2): qty 1

## 2021-09-28 MED ORDER — SODIUM CHLORIDE 0.9 % IV SOLN: Freq: Once | INTRAVENOUS | Status: AC

## 2021-09-28 MED ORDER — HYDROCODONE-ACETAMINOPHEN 10-325 MG PO TABS: 1.0000 | ORAL_TABLET | Freq: Four times a day (QID) | ORAL | Status: DC | PRN

## 2021-09-28 MED ORDER — ALBUTEROL SULFATE (2.5 MG/3ML) 0.083% IN NEBU
2.5000 mg | INHALATION_SOLUTION | RESPIRATORY_TRACT | Status: DC | PRN
  Filled 2021-09-28: qty 3

## 2021-09-28 NOTE — Hospital Course (Signed)
71 year old female with a history of HFrEF (35-40%) improved to 55-60% on 01/29/21 echo, nonobstructive CAD, hypertension, TIA, diabetes mellitus type 2, asthma, NASH cirrhosis, GERD, carotid artery stenosis (s/p R CEA in 2019), chronic LBBB, mitral stenosis presenting with 2 to 3-week history of dyspnea on exertion.  The patient has been using her albuterol inhaler at home without much improvement.  Over the past 1 to 2 days, her shortness of breath had worsened.  She went to see her PCP on the morning of 09/28/2021.  The patient was noted to be tachycardic with a wide-complex tachycardia in the 170s.  EMS was activated.  The patient was given  6 mg of adenosine followed by 12 mg of adenosine with temporary conversion to normal sinus rhythm but had recurrent tachycardia with heart rate in the 170's. She denies any fevers, chills, chest pain, nausea, vomiting or diarrhea, Don[place] pain, dysuria, hematuria, hematochezia, melena. In the ED, the patient was afebrile and hemodynamically stable.  WBC 11.6, hemoglobin 13.9, platelets 232,000.  Sodium 142, potassium 3.4, chloride 23, BUN 14, creatinine 0.97.  Troponin was 32.  Chest x-ray showed chronic residual prominence.  EKG showed SVT with left bundle branch block.  Cardiology was consulted, and the patient was started on amiodarone drip.

## 2021-09-28 NOTE — Assessment & Plan Note (Signed)
Continue pantoprazole. °

## 2021-09-28 NOTE — Assessment & Plan Note (Signed)
Continue Jardiance  in the setting of HFrEF NovoLog sliding scale Check hemoglobin A1c

## 2021-09-28 NOTE — Assessment & Plan Note (Signed)
Replete Check magnesium

## 2021-09-28 NOTE — Assessment & Plan Note (Signed)
Patient appears clinically euvolemic Continue Entresto Holding carvedilol to low BP margin Continue Jardiance

## 2021-09-28 NOTE — Progress Notes (Signed)
a 

## 2021-09-28 NOTE — Assessment & Plan Note (Signed)
Continue amiodarone drip Appreciate cardiology consult Optimize electrolytes TSH Echocardiogram

## 2021-09-28 NOTE — ED Triage Notes (Signed)
Pt arrives via [facility]. EMS dfrom a doctor's office in [place]. Pt was given 6 mg and 12 mg of adenosine enroute, with temporary conversion to NSR. Pt HR was in the 170's throughout transport. Pt denies CP SOB.

## 2021-09-28 NOTE — ED Provider Notes (Signed)
[facility] Provider Note   CSN: [id number] Arrival date & time: 09/28/21  1044     History  Chief Complaint  Patient presents with   SVT    Carol Richmond is a 71 y.o. female.  She has a history of coronary disease CHF.  She was having left posterior shoulder pain yesterday and went to her primary care doctor today.  Was diaphoretic and a little short of breath.  Found to be in a wide-complex tachycardia by EMS given adenosine 6 and 12 with brief return to sinus tachycardia and then increased rhythm again.  Currently wide-complex tach 170s.  She denies any current symptoms of chest pain shortness of breath diaphoresis nausea vomiting.  No recent illness no medication changes. She is not on blood thinners. The history is provided by the patient.  Shortness of Breath Severity:  Moderate Onset quality:  Gradual Duration:  1 day Timing:  Constant Progression:  Improving Chronicity:  New Relieved by:  None tried Worsened by:  Activity Ineffective treatments:  None tried Associated symptoms: diaphoresis   Associated symptoms: no abdominal pain, no chest pain, no cough, no fever, no headaches, no rash, no sputum production and no vomiting   Risk factors: no tobacco use        Home Medications Prior to Admission medications   Medication Sig Start Date End Date Taking? Authorizing Provider  acetaminophen (TYLENOL) 500 MG tablet Take 500 mg by mouth every 6 (six) hours as needed for mild pain.    [provider]  albuterol (VENTOLIN HFA) 108 (90 Base) MCG/ACT inhaler INHALE 2 PUFFS INTO THE LUNGS EVERY 4 HOURS AS NEEDED FOR SHORTNESS OF BREATH. 08/28/21   Gwenlyn Perking, FNP  aspirin EC 81 MG tablet Take 1 tablet (81 mg total) by mouth daily. 09/24/18   Rogene Houston, MD  baclofen (LIORESAL) 10 MG tablet Take 1 tablet (10 mg total) by mouth 3 (three) times daily. 06/19/20   Gwenlyn Perking, FNP  carvedilol (COREG) 3.125 MG tablet TAKE 1 TABLET BY  MOUTH 2 TIMES A DAY WITH MEALS. 07/30/21   Evelina Dun A, FNP  cholecalciferol (VITAMIN D) 1000 UNITS tablet Take 1,000 Units by mouth daily.    [provider]  DULoxetine (CYMBALTA) 60 MG capsule TAKE 1 CAPSULE BY MOUTH ONCE DAILY. 08/24/21   Gwenlyn Perking, FNP  empagliflozin (JARDIANCE) 10 MG TABS tablet Take 1 tablet (10 mg total) by mouth daily before breakfast. 09/12/21   Gwenlyn Perking, FNP  fluticasone (FLONASE) 50 MCG/ACT nasal spray Place 2 sprays into both nostrils daily. 07/18/21   Baruch Gouty, FNP  fluticasone (FLOVENT HFA) 44 MCG/ACT inhaler Inhale 2 puffs into the lungs 2 (two) times daily. 06/27/20   Gwenlyn Perking, FNP  furosemide (LASIX) 40 MG tablet Take 1 tablet (40 mg total) by mouth daily. 05/15/21   Gwenlyn Perking, FNP  gabapentin (NEURONTIN) 300 MG capsule Take 300 mg by mouth 3 (three) times daily as needed (pain). 01/24/20   [provider]  HYDROcodone-acetaminophen (NORCO) 10-325 MG tablet hydrocodone 10 mg-acetaminophen 325 mg tablet  Take 1 tablet 3 times a day by oral route as needed for pain for 30 days.    [provider]  lactulose (CHRONULAC) 10 GM/15ML solution TAKE 30 MLS (20 GRAM TOTAL) BY MOUTH 3 TIMES A DAY. 07/24/21   Rehman, Mechele Dawley, MD  lamoTRIgine (LAMICTAL) 200 MG tablet Take 1 tablet (200 mg total) by mouth 2 (  two) times daily. 12/21/20   Gwenlyn Perking, FNP  levocetirizine (XYZAL) 5 MG tablet TAKE 1 TABLET EVERY EVENING. 09/21/21   Gwenlyn Perking, FNP  LORazepam (ATIVAN) 0.5 MG tablet Take 1 tablet (0.5 mg total) by mouth 2 (two) times daily as needed for anxiety. 09/12/21   Gwenlyn Perking, FNP  melatonin 5 MG TABS Take 5 mg by mouth at bedtime.    [provider]  naloxone [facility]) nasal spray 4 mg/0.1 mL Place into the nose once. 07/19/20   [provider]  ondansetron (ZOFRAN) 4 MG tablet Take 1 tablet (4 mg total) by mouth every 8 (eight) hours as needed for nausea or vomiting. 09/18/20    Gwenlyn Perking, FNP  pantoprazole (PROTONIX) 40 MG tablet Take 1 tablet (40 mg total) by mouth 2 (two) times daily. 09/11/21   Carlan, Chelsea L, NP  potassium chloride SA (KLOR-CON M) 20 MEQ tablet Take 1 tablet (20 mEq total) by mouth daily. 03/26/21   Janora Norlander, DO  PRESCRIPTION MEDICATION [facility]    [provider]  sacubitril-valsartan (ENTRESTO) 24-26 MG Take 1 tablet by mouth 2 (two) times daily. 05/21/21   Satira Sark, MD      Allergies    Patient has no known allergies.    Review of Systems   Review of Systems  Constitutional:  Positive for diaphoresis. Negative for fever.  Eyes:  Negative for visual disturbance.  Respiratory:  Positive for shortness of breath. Negative for cough and sputum production.   Cardiovascular:  Negative for chest pain.  Gastrointestinal:  Negative for abdominal pain and vomiting.  Skin:  Negative for rash.  Neurological:  Negative for syncope and headaches.    Physical Exam Updated Vital Signs BP 121/86   Pulse 87   Temp 97.9 F (36.6 C) (Oral)   Resp (!) 26   SpO2 97%  Physical Exam Vitals and nursing note reviewed.  Constitutional:      General: She is not in acute distress.    Appearance: Normal appearance. She is well-developed.  HENT:     Head: Normocephalic and atraumatic.  Eyes:     Conjunctiva/sclera: Conjunctivae normal.  Cardiovascular:     Rate and Rhythm: Regular rhythm. Tachycardia present.     Heart sounds: No murmur heard. Pulmonary:     Effort: Pulmonary effort is normal. No respiratory distress.     Breath sounds: Normal breath sounds.  Abdominal:     Palpations: Abdomen is soft.     Tenderness: There is no abdominal tenderness. There is no guarding or rebound.  Musculoskeletal:        General: Normal range of motion.     Cervical back: Neck supple.     Right lower leg: No edema.     Left lower leg: No edema.  Skin:    General: Skin is warm and dry.     Capillary Refill: Capillary refill  takes less than 2 seconds.  Neurological:     General: No focal deficit present.     Mental Status: She is alert.     Sensory: No sensory deficit.     Motor: No weakness.     ED Results / Procedures / Treatments   Labs (all labs ordered are listed, but only abnormal results are displayed) Labs Reviewed  BASIC METABOLIC PANEL - Abnormal; Notable for the following components:      Result Value   Potassium 3.4 (*)    Glucose, Bld 249 (*)  All other components within normal limits  CBC - Abnormal; Notable for the following components:   WBC 11.6 (*)    All other components within normal limits  BRAIN NATRIURETIC PEPTIDE - Abnormal; Notable for the following components:   B Natriuretic Peptide 1,025.0 (*)    All other components within normal limits  GLUCOSE, CAPILLARY - Abnormal; Notable for the following components:   Glucose-Capillary 140 (*)    All other components within normal limits  TROPONIN I (HIGH SENSITIVITY) - Abnormal; Notable for the following components:   Troponin I (High Sensitivity) 32 (*)    All other components within normal limits  TROPONIN I (HIGH SENSITIVITY) - Abnormal; Notable for the following components:   Troponin I (High Sensitivity) 31 (*)    All other components within normal limits  TSH  MAGNESIUM  HIV ANTIBODY (ROUTINE TESTING W REFLEX)  BASIC METABOLIC PANEL  MAGNESIUM  TSH  HEMOGLOBIN A1C    EKG EKG Interpretation  Date/Time:  Friday September 28 2021 10:58:20 EDT Ventricular Rate:  176 PR Interval:  107 QRS Duration: 144 QT Interval:  319 QTC Calculation: 546 R Axis:   -81 Text Interpretation: svt with LBBB Confirmed by Aletta Edouard [phone]) on 09/28/2021 11:00:17 AM  Radiology DG Chest Port 1 View  Result Date: 09/28/2021 CLINICAL DATA:  SVT, elevated heart rate in the 170 EXAM: PORTABLE CHEST 1 VIEW COMPARISON:  Radiograph 07/03/2017 FINDINGS: Unchanged enlarged cardiac silhouette. There is no focal airspace consolidation.  There is no pleural effusion. No pneumothorax. There is no acute osseous abnormality. IMPRESSION: Unchanged enlarged cardiac silhouette.  No focal airspace disease. Electronically Signed   By: Maurine Simmering M.D.   On: 09/28/2021 11:11    Procedures .Critical Care  Performed by: Hayden Rasmussen, MD Authorized by: Hayden Rasmussen, MD   Critical care provider statement:    Critical care time (minutes):  45   Critical care time was exclusive of:  Separately billable procedures and treating other patients   Critical care was necessary to treat or prevent imminent or life-threatening deterioration of the following conditions:  Circulatory failure and cardiac failure   Critical care was time spent personally by me on the following activities:  Development of treatment plan with patient or surrogate, discussions with consultants, evaluation of patient's response to treatment, examination of patient, obtaining history from patient or surrogate, ordering and performing treatments and interventions, ordering and review of laboratory studies, ordering and review of radiographic studies, pulse oximetry, re-evaluation of patient's condition and review of old charts   I assumed direction of critical care for this patient from another provider in my specialty: no       Medications Ordered in ED Medications  amiodarone (NEXTERONE) 1.8 mg/mL load via infusion 150 mg (150 mg Intravenous Bolus from Bag 09/28/21 1117)    Followed by  amiodarone (NEXTERONE PREMIX) 360-4.14 MG/200ML-% (1.8 mg/mL) IV infusion (60 mg/hr Intravenous New Bag/Given 09/28/21 1418)    Followed by  amiodarone (NEXTERONE PREMIX) 360-4.14 MG/200ML-% (1.8 mg/mL) IV infusion (30 mg/hr Intravenous New Bag/Given 09/28/21 1714)  furosemide (LASIX) tablet 40 mg (has no administration in time range)  aspirin EC tablet 81 mg (has no administration in time range)  sacubitril-valsartan (ENTRESTO) 24-26 mg per tablet (has no administration in time  range)  DULoxetine (CYMBALTA) DR capsule 60 mg (has no administration in time range)  LORazepam (ATIVAN) tablet 0.5 mg (has no administration in time range)  empagliflozin (JARDIANCE) tablet 10 mg (has no administration in time  range)  pantoprazole (PROTONIX) EC tablet 40 mg (40 mg Oral Given 09/28/21 1634)  lamoTRIgine (LAMICTAL) tablet 200 mg (has no administration in time range)  gabapentin (NEURONTIN) capsule 300 mg (has no administration in time range)  HYDROcodone-acetaminophen (NORCO) 10-325 MG per tablet 1 tablet (has no administration in time range)  potassium chloride SA (KLOR-CON M) CR tablet 20 mEq (20 mEq Oral Given 09/28/21 1427)  cholecalciferol (VITAMIN D3) 25 MCG (1000 UNIT) tablet 1,000 Units (has no administration in time range)  fluticasone (FLONASE) 50 MCG/ACT nasal spray 2 spray (has no administration in time range)  enoxaparin (LOVENOX) injection 40 mg (has no administration in time range)  acetaminophen (TYLENOL) tablet 650 mg (has no administration in time range)    Or  acetaminophen (TYLENOL) suppository 650 mg (has no administration in time range)  ondansetron (ZOFRAN) tablet 4 mg (has no administration in time range)    Or  ondansetron (ZOFRAN) injection 4 mg (has no administration in time range)  insulin aspart (novoLOG) injection 0-9 Units (1 Units Subcutaneous Given 09/28/21 1712)  albuterol (PROVENTIL) (2.5 MG/3ML) 0.083% nebulizer solution 2.5 mg (has no administration in time range)  budesonide (PULMICORT) nebulizer solution 0.25 mg (has no administration in time range)  0.9 %  sodium chloride infusion (0 mLs Intravenous Stopped 09/28/21 1240)  potassium chloride SA (KLOR-CON M) CR tablet 40 mEq (40 mEq Oral Given 09/28/21 1241)  amiodarone (NEXTERONE) 1.8 mg/mL load via infusion 150 mg (150 mg Intravenous Bolus from Bag 09/28/21 1420)    ED Course/ Medical Decision Making/ A&P Clinical Course as of 09/28/21 1912  Fri Sep 28, 2021  1105 discussed with Dr.  Harl Bowie cardiology.  He said he would come to bedside to review the EMS strips. [MB]  1120 Patient seen by Dr. Harl Bowie.  He is ordering amiodarone for SVT with left bundle branch block.  Feel she will need admission to the hospital by the hospitalist. [MB]  1238 Discussed with Dr. Carles Collet [facility] hospitalist who will evaluate patient for admission. [MB]    Clinical Course User Index [MB] Hayden Rasmussen, MD                           Medical Decision Making Amount and/or Complexity of Data Reviewed Labs: ordered. Radiology: ordered.  Risk Prescription drug management. Decision regarding hospitalization.  This patient complains of general weakness posterior left shoulder pain; this involves an extensive number of treatment Options and is a complaint that carries with it a high risk of complications and morbidity. The differential includes arrhythmia, ACS, dissection, PE, pneumothorax, anemia, metabolic derangement  I ordered, reviewed and interpreted labs, which included CBC with elevated white count normal hemoglobin, chemistries with mildly low potassium to be trended, BNP elevated, TSH normal, magnesium normal I ordered medication IV amiodarone bolus and drip and reviewed PMP when indicated. I ordered imaging studies which included chest x-ray and I independently    visualized and interpreted imaging which showed no acute findings Additional history obtained from patient's family member Previous records obtained and reviewed in epic does have a history of SVT and left bundle branch block I consulted cardiology Dr. Harl Bowie and discussed lab and imaging findings and discussed disposition.  Cardiac monitoring reviewed, wide-complex tachycardia consistent with SVT Social determinants considered, no significant barriers Critical Interventions: Initiation of IV rate control for tachycardia  After the interventions stated above, I reevaluated the patient and found patient symptoms to be  improving Admission and  further testing considered, she will need admission to the ICU for further management of her arrhythmia.  Patient in agreement with plan.          Final Clinical Impression(s) / ED Diagnoses Final diagnoses:  SVT (supraventricular tachycardia) ([place])    Rx / DC Orders ED Discharge Orders     None         Hayden Rasmussen, MD 09/28/21 1916

## 2021-09-28 NOTE — Assessment & Plan Note (Signed)
Continue Lamictal

## 2021-09-28 NOTE — ED Notes (Signed)
Delay in amiodarone due to pyxis being out of stock. Pharmacy called and notified to bring medication to ED stat

## 2021-09-28 NOTE — ED Notes (Signed)
Called [facility] to look at EKG

## 2021-09-28 NOTE — Assessment & Plan Note (Signed)
Holding carvedilol as BP is soft and to allow BP margin for titration of chronotropic medications

## 2021-09-28 NOTE — Progress Notes (Signed)
Established Patient Office Visit  Subjective   [name], female    DOB: 06-15-1950  Age: 70 y.o. MRN: [id number]  Chief Complaint  Patient presents with   Shortness of Breath    Shortness of Breath This is a new problem. The current episode started yesterday. The problem occurs constantly. The problem has been gradually worsening. Pertinent negatives include no abdominal pain, chest pain, claudication, ear pain, fever, headaches, hemoptysis, leg pain, leg swelling, neck pain, orthopnea, PND, rash, rhinorrhea, sore throat, sputum production, swollen glands, syncope, vomiting or wheezing. The symptoms are aggravated by any activity. She has tried beta agonist inhalers for the symptoms. The treatment provided no relief. Her past medical history is significant for a heart failure.  She also reports pain in her left upper back yesterday around her shoulder blade. She also feels weak and has been sweating.    Past Medical History:  Diagnosis Date   Asthma    Carotid artery disease ([place])    Diverticulosis of colon (without mention of hemorrhage)    Essential hypertension    Functional ovarian cysts    GERD (gastroesophageal reflux disease)    Hemorrhoids, external, without mention of complication    Hormone replacement therapy (postmenopausal)    IBS (irritable bowel syndrome)    Migraine    Mitral valve disease    Right knee DJD    Seizures ([facility])    Sleep apnea    TIA (transient ischemic attack)    Type 2 diabetes mellitus ([place])    Type 2 diabetes mellitus with hyperglycemia, without long-term current use of insulin ([place]) 08/07/2017      Review of Systems  Constitutional:  Positive for diaphoresis and malaise/fatigue. Negative for chills, fever and weight loss.  HENT:  Negative for ear pain, rhinorrhea and sore throat.   Respiratory:  Positive for shortness of breath. Negative for hemoptysis, sputum production and wheezing.   Cardiovascular:  Negative for  chest pain, orthopnea, claudication, leg swelling, syncope and PND.  Gastrointestinal:  Negative for abdominal pain, nausea and vomiting.  Musculoskeletal:  Positive for back pain (left thoracic). Negative for neck pain.  Skin:  Negative for rash.  Neurological:  Positive for weakness. Negative for dizziness, speech change, focal weakness, seizures, loss of consciousness and headaches.      Objective:     BP 113/80   Pulse (!) 179   Temp (!) 97 F (36.1 C) (Temporal)   Ht 5' 4"  (1.626 m)   Wt 175 lb 4 oz (79.5 kg)   SpO2 95%   BMI 30.08 kg/m  BP Readings from Last 3 Encounters:  09/28/21 113/80  09/12/21 (!) 99/56  08/27/21 111/63      Physical Exam Vitals and nursing note reviewed.  Constitutional:      General: She is awake.     Appearance: She is diaphoretic.  HENT:     Head: Normocephalic and atraumatic.  Cardiovascular:     Rate and Rhythm: Tachycardia present.     Heart sounds: No murmur heard. Pulmonary:     Effort: Tachypnea present. No accessory muscle usage.     Breath sounds: Normal breath sounds. No wheezing.  Abdominal:     Palpations: Abdomen is soft.     Tenderness: There is no abdominal tenderness.  Musculoskeletal:     Right lower leg: No edema.     Left lower leg: No edema.  Skin:    Coloration: Skin is pale. Skin is not cyanotic.  Neurological:     Mental Status: She is alert and oriented to person, place, and time.  Psychiatric:        Mood and Affect: Mood is anxious.   No results found for any visits on 09/28/21.    The 10-year ASCVD risk score (Arnett DK, et al., 2019) is: 17.5%    Assessment & Plan:   Carol Richmond was seen today for shortness of breath.  Diagnoses and all orders for this visit:  SOB (shortness of breath) Diaphoresis Weakness Tachycardia BP and pulse ox are stable. EKG is consistent with SVT. New onset dyspnea with diaphoresis and weakness. Also reporting pain in left thoracic region yesterday. Discussed with  patient the need for EMS transport to the ER for management of symptomatic SVT and further evaluation of possible ACS. Spoke with patient's daughter Carol Richmond via telephone at [employer] request. Carol Richmond agreed with the plan and will meet her mother at [facility]. Care transferred to EMS upon their arrival at 10:00. -     EKG 12-Lead  The patient indicates understanding of these issues and agrees with the plan.   Carol Perking, FNP

## 2021-09-28 NOTE — H&P (Signed)
History and Physical    Patient: Carol [place] RAQ:[id number] DOB: 08-01-1950 DOA: 09/28/2021 DOS: the patient was seen and examined on 09/28/2021 PCP: Carol Perking, FNP  Patient coming from: Home  Chief Complaint:  Chief Complaint  Patient presents with   SVT   HPI: Carol [place] is 71 year old female with a history of HFrEF (35-40%) improved to 55-60% on 01/29/21 echo, nonobstructive CAD, hypertension, TIA, diabetes mellitus type 2, asthma, NASH cirrhosis, GERD, carotid artery stenosis (s/p R CEA in 2019), chronic LBBB, mitral stenosis presenting with 2 to 3-week history of dyspnea on exertion.  The patient has been using her albuterol inhaler at home without much improvement.  Over the past 1 to 2 days, her shortness of breath had worsened.  She went to see her PCP on the morning of 09/28/2021.  The patient was noted to be tachycardic with a wide-complex tachycardia in the 170s.  EMS was activated.  The patient was given  6 mg of adenosine followed by 12 mg of adenosine with temporary conversion to normal sinus rhythm but had recurrent tachycardia with heart rate in the 170's. She denies any fevers, chills, chest [place], nausea, vomiting Richmond diarrhea, [place], dysuria, hematuria, hematochezia, melena. In the ED, the patient was afebrile and hemodynamically stable.  WBC 11.6, hemoglobin 13.9, platelets 232,000.  Sodium 142, potassium 3.4, chloride 23, BUN 14, creatinine 0.97.  Troponin was 32.  Chest x-ray showed chronic residual prominence.  EKG showed SVT with left bundle Richmond block.  Cardiology was consulted, and the patient was started on amiodarone drip.  Review of Systems: As mentioned in the history of present illness. All other systems reviewed and are negative. Past Medical History:  Diagnosis Date   Asthma    Carotid artery disease ([facility])    Diverticulosis of colon (without mention of hemorrhage)    Essential hypertension    Functional ovarian cysts    GERD  (gastroesophageal reflux disease)    Hemorrhoids, external, without mention of complication    Hormone replacement therapy (postmenopausal)    IBS (irritable bowel syndrome)    Migraine    Mitral valve disease    Right knee DJD    Seizures ([place])    Sleep apnea    TIA (transient ischemic attack)    Type 2 diabetes mellitus ([facility] [place])    Type 2 diabetes mellitus with hyperglycemia, without long-term current use of insulin ([facility] [place]) 08/07/2017   Past Surgical History:  Procedure Laterality Date   BIOPSY  09/23/2018   Procedure: BIOPSY;  Surgeon: Rogene Houston, MD;  Location: [facility];  Service: Endoscopy;;  duodeneum and stomach   CHOLECYSTECTOMY  1980   ENDARTERECTOMY Right [place]   Procedure: ENDARTERECTOMY CAROTID RIGHT;  Surgeon: Serafina Mitchell, MD;  Location: [facility];  Service: Vascular;  Laterality: Right;   ESOPHAGOGASTRODUODENOSCOPY N/A 09/23/2018   Procedure: ESOPHAGOGASTRODUODENOSCOPY (EGD);  Surgeon: Rogene Houston, MD;  Location: [facility];  Service: Endoscopy;  Laterality: N/A;  2:45-moved to 1:45 per Lelon Frohlich   ESOPHAGOGASTRODUODENOSCOPY (EGD) WITH PROPOFOL N/A 03/07/2021   Procedure: ESOPHAGOGASTRODUODENOSCOPY (EGD) WITH PROPOFOL;  Surgeon: Rogene Houston, MD;  Location: [facility];  Service: Endoscopy;  Laterality: N/A;  12:50   LEFT HEART CATHETERIZATION WITH CORONARY ANGIOGRAM N/A 11/18/2013   Procedure: LEFT HEART CATHETERIZATION WITH CORONARY ANGIOGRAM;  Surgeon: Burnell Blanks, MD;  Location: [facility];  Service: Cardiovascular;  Laterality: N/A;   OOPHORECTOMY     2012 Dr. Adah Perl in [place]   Procedure: RIGHT CAROTID PATCH ANGIOPLASTY;  Surgeon: Serafina Mitchell, MD;  Location: [facility];  Service: Vascular;  Laterality: Right;   TONSILLECTOMY     TOTAL KNEE ARTHROPLASTY Right 09/02/2012   Procedure: TOTAL KNEE ARTHROPLASTY- right;  Surgeon: Ninetta Lights, MD;  Location: [facility];  Service: Orthopedics;  Laterality: Right;    TUBAL LIGATION     TYMPANOMASTOIDECTOMY Left 04/02/2016   Procedure: LEFT CANAL WALL DOWN TYMPANOMASTOIDECTOMY;  Surgeon: Leta Baptist, MD;  Location: [facility];  Service: ENT;  Laterality: Left;   [facility] Right 07/02/2017   Procedure: RIGHT NECK EXPLORATION WITH INTRAOPERATIVE ULTRASOUND;  Surgeon: Angelia Mould, MD;  Location: [facility];  Service: Vascular;  Laterality: Right;   Social History:  reports that she has never smoked. She has never used smokeless tobacco. She reports that she does not drink alcohol and does not use drugs.  No Known Allergies  Family History  Problem Relation Age of Onset   Heart disease Mother    Diabetes Mother    Heart disease Brother    Cancer Sister    Heart disease Brother    Heart disease Brother    Heart disease Son     Prior to Admission medications   Medication Sig Start Date End Date Taking? Authorizing Provider  acetaminophen (TYLENOL) 500 MG tablet Take 500 mg by mouth every 6 (six) hours as needed for mild [place].   Yes [provider]  albuterol (VENTOLIN HFA) 108 (90 Base) MCG/ACT inhaler INHALE 2 PUFFS INTO THE LUNGS EVERY 4 HOURS AS NEEDED FOR SHORTNESS OF BREATH. 08/28/21  Yes Carol Perking, FNP  aspirin EC 81 MG tablet Take 1 tablet (81 mg total) by mouth daily. 09/24/18  Yes Rehman, Mechele Dawley, MD  baclofen (LIORESAL) 10 MG tablet Take 1 tablet (10 mg total) by mouth 3 (three) times daily. 06/19/20   Carol Perking, FNP  carvedilol (COREG) 3.125 MG tablet TAKE 1 TABLET BY MOUTH 2 TIMES A DAY WITH MEALS. 07/30/21   Evelina Dun A, FNP  cholecalciferol (VITAMIN D) 1000 UNITS tablet Take 1,000 Units by mouth daily.    [provider]  DULoxetine (CYMBALTA) 60 MG capsule TAKE 1 CAPSULE BY MOUTH ONCE DAILY. 08/24/21   Carol Perking, FNP  empagliflozin (JARDIANCE) 10 MG TABS tablet Take 1 tablet (10 mg total) by mouth daily before breakfast. 09/12/21   Carol Perking,  FNP  fluticasone (FLONASE) 50 MCG/ACT nasal spray Place 2 sprays into both nostrils daily. 07/18/21   Baruch Gouty, FNP  fluticasone (FLOVENT HFA) 44 MCG/ACT inhaler Inhale 2 puffs into the lungs 2 (two) times daily. 06/27/20   Carol Perking, FNP  furosemide (LASIX) 40 MG tablet Take 1 tablet (40 mg total) by mouth daily. 05/15/21   Carol Perking, FNP  gabapentin (NEURONTIN) 300 MG capsule Take 300 mg by mouth 3 (three) times daily as needed ([place]). 01/24/20   [provider]  HYDROcodone-acetaminophen (NORCO) 10-325 MG tablet hydrocodone 10 mg-acetaminophen 325 mg tablet  Take 1 tablet 3 times a day by oral route as needed for [place] for 30 days.    [provider]  lactulose (CHRONULAC) 10 GM/15ML solution TAKE 30 MLS (20 GRAM TOTAL) BY MOUTH 3 TIMES A DAY. 07/24/21   Rehman, Mechele Dawley, MD  lamoTRIgine (LAMICTAL) 200 MG tablet Take 1 tablet (200 mg total) by mouth 2 (two) times daily.  12/21/20   Carol Perking, FNP  levocetirizine (XYZAL) 5 MG tablet TAKE 1 TABLET EVERY EVENING. 09/21/21   Carol Perking, FNP  LORazepam (ATIVAN) 0.5 MG tablet Take 1 tablet (0.5 mg total) by mouth 2 (two) times daily as needed for anxiety. 09/12/21   Carol Perking, FNP  melatonin 5 MG TABS Take 5 mg by mouth at bedtime.    [provider]  naloxone [facility]) nasal spray 4 mg/0.1 mL Place into the nose once. 07/19/20   [provider]  ondansetron (ZOFRAN) 4 MG tablet Take 1 tablet (4 mg total) by mouth every 8 (eight) hours as needed for nausea [place] vomiting. 09/18/20   Carol Perking, FNP  pantoprazole (PROTONIX) 40 MG tablet Take 1 tablet (40 mg total) by mouth 2 (two) times daily. 09/11/21   Carlan, Chelsea L, NP  potassium chloride SA (KLOR-CON M) 20 MEQ tablet Take 1 tablet (20 mEq total) by mouth daily. 03/26/21   Janora Norlander, DO  PRESCRIPTION MEDICATION [employer]    [provider]  sacubitril-valsartan (ENTRESTO) 24-26 MG Take 1 tablet by mouth 2 (two) times  daily. 05/21/21   Satira Sark, MD    Physical Exam: Vitals:   09/28/21 1200 09/28/21 1215 09/28/21 1243 09/28/21 1245  BP: 98/82 103/82  103/82  Pulse: (!) 166 (!) 166  (!) 168  Resp: (!) 23 19  (!) 28  Temp:   97.9 F (36.6 C)   TempSrc:   Oral   SpO2: 95% 97%  94%   GENERAL:  A&O x 3, NAD, well developed, cooperative, follows commands HEENT: [place]/AT, No thrush, No icterus, No oral ulcers Neck:  No neck mass, No meningismus, soft, supple CV: IRRR, no S3, no S4, no rub, no JVD Lungs:  CTA, no wheeze, no rhonchi, good air movement Abd: soft/NT +BS, nondistended Ext: No edema, no lymphangitis, no cyanosis, no rashes Neuro:  CN II-XII intact, strength 4/5 in RUE, RLE, strength 4/5 LUE, LLE; sensation intact bilateral; no dysmetria; babinski equivocal  Data Reviewed: Data reviewed above in history  Assessment and Plan: * SVT (supraventricular tachycardia) ([facility]) Continue amiodarone drip Appreciate cardiology consult Optimize electrolytes TSH Echocardiogram  Hypokalemia Replete Check magnesium  Chronic HFrEF (heart failure with reduced ejection fraction) ([facility] [place]) Patient appears clinically euvolemic Continue Entresto Holding carvedilol to low BP margin Continue Jardiance  Controlled type 2 diabetes mellitus with hyperglycemia, without long-term current use of insulin ([facility] [place]) Continue Jardiance  in the setting of HFrEF NovoLog sliding scale Check hemoglobin A1c   Cirrhosis, nonalcoholic ([place]) Patient follows with [facility] Appears clinically compensated  Seizures ([place]) Continue Lamictal  Hypertension Holding carvedilol as BP is soft and to allow BP margin for titration of chronotropic medications  GERD (gastroesophageal reflux disease) Continue pantoprazole      Advance Care Planning: FULL  Consults: cardiology  Family Communication: daughter 8/11  Severity of Illness: The appropriate patient status for this patient is INPATIENT. Inpatient status is  judged to be reasonable and necessary in order to provide the required intensity of service to ensure the patient's safety. The patient's presenting symptoms, physical exam findings, and initial radiographic and laboratory data in the context of their chronic comorbidities is felt to place them at high risk for further clinical deterioration. Furthermore, it is not anticipated that the patient will be medically stable for discharge from the hospital within 2 midnights of admission.   * I certify that at the point of admission it is my clinical  judgment that the patient will require inpatient hospital care spanning beyond 2 midnights from the point of admission due to high intensity of service, high risk for further deterioration and high frequency of surveillance required.*  Author: Orson Eva, MD 09/28/2021 12:59 PM  For on call review [email or web address].

## 2021-09-28 NOTE — Assessment & Plan Note (Signed)
Patient follows with [facility] Appears clinically compensated

## 2021-09-28 NOTE — Consult Note (Addendum)
Cardiology Consultation:   [name] MRN: [id number]; DOB: [birth year]  Admit date: 09/28/2021 Date of Consult: 09/28/2021  PCP:  Gwenlyn Perking, [place] [facility] Cardiologist:  Rozann Lesches, MD        Patient Profile:   Carol Richmond is a 71 y.o. female with a hx of HFimpEF (EF previously reduced with cath in 2015 showing mild nonobstructive CAD, EF at 55-60% by echo in 01/2021), carotid artery stenosis (s/p R CEA in 2019), chronic LBBB, mitral stenosis, Type 2 DM, HTN and GERD who is being seen 09/28/2021 for the evaluation of SVT at the request of Dr. Melina Copa.  History of Present Illness:   Carol Richmond presented to [facility] ED on 09/28/2021 for evaluation of SVT. She was evaluated by her PCP this morning and noted to be tachycardic. EMS was called and she received 6 mg of Adenosine followed by 12 mg of Adenosine with temporary conversion to normal sinus rhythm but had recurrent tachycardia with heart rate in the 170's.   Initial labs showed WBC 11.6, Hgb 13.9, platelets 232, Na+ 142, K+ 3.4 and creatinine 0.97. Initial Hs Troponin 32 with repeat pending. BNP pending. CXR shows no acute findings. EKG shows a wide-complex tachycardia, HR 176 and felt to be consistent with SVT in the setting of her known LBBB.  She was a started on IV Amiodarone approximately 30 minutes prior to this encounter.  In talking with the patient today, she reports having worsening dyspnea on exertion for the past 3 weeks. She was having to use her inhaler more frequently with transient improvement in her symptoms. She denies any specific chest pain or palpitations during this timeframe but says she has experienced occasional flutters in the past.  Reports having difficulty sleeping at night but this has been occurring for several years. No specific orthopnea, PND or pitting edema. No new prescription medications or supplements. She does consume several [employer]  sodas a day but denies any change in this. No alcohol use.   Past Medical History:  Diagnosis Date   Asthma    Carotid artery disease ([place])    Diverticulosis of colon (without mention of hemorrhage)    Essential hypertension    Functional ovarian cysts    GERD (gastroesophageal reflux disease)    Hemorrhoids, external, without mention of complication    Hormone replacement therapy (postmenopausal)    IBS (irritable bowel syndrome)    Migraine    Mitral valve disease    Right knee DJD    Seizures (HCC)    Sleep apnea    TIA (transient ischemic attack)    Type 2 diabetes mellitus ([place])    Type 2 diabetes mellitus with hyperglycemia, without long-term current use of insulin ([place]) 08/07/2017    Past Surgical History:  Procedure Laterality Date   BIOPSY  09/23/2018   Procedure: BIOPSY;  Surgeon: Rogene Houston, MD;  Location: [facility];  Service: Endoscopy;;  duodeneum and stomach   CHOLECYSTECTOMY  1980   ENDARTERECTOMY Right 07/01/2017   Procedure: ENDARTERECTOMY CAROTID RIGHT;  Surgeon: Serafina Mitchell, MD;  Location: [facility] OR;  Service: Vascular;  Laterality: Right;   ESOPHAGOGASTRODUODENOSCOPY N/A 09/23/2018   Procedure: ESOPHAGOGASTRODUODENOSCOPY (EGD);  Surgeon: Rogene Houston, MD;  Location: [facility];  Service: Endoscopy;  Laterality: N/A;  2:45-moved to 1:45 per Lelon Frohlich   ESOPHAGOGASTRODUODENOSCOPY (EGD) WITH PROPOFOL N/A 03/07/2021   Procedure: ESOPHAGOGASTRODUODENOSCOPY (EGD) WITH PROPOFOL;  Surgeon: Rogene Houston, MD;  [facility]: [facility];  Service: Endoscopy;  Laterality: N/A;  12:50   LEFT HEART CATHETERIZATION WITH CORONARY ANGIOGRAM N/A 11/18/2013   Procedure: LEFT HEART CATHETERIZATION WITH CORONARY ANGIOGRAM;  Surgeon: Burnell Blanks, MD;  Location: [facility] CATH LAB;  Service: Cardiovascular;  Laterality: N/A;   OOPHORECTOMY     2012 Dr. Adah Perl in [place] Right 07/01/2017   Procedure: RIGHT CAROTID PATCH ANGIOPLASTY;  Surgeon: Serafina Mitchell, MD;  Location: [facility];  Service: Vascular;  Laterality: Right;   TONSILLECTOMY     TOTAL KNEE ARTHROPLASTY Right 09/02/2012   Procedure: TOTAL KNEE ARTHROPLASTY- right;  Surgeon: Ninetta Lights, MD;  Location: [place];  Service: Orthopedics;  Laterality: Right;   TUBAL LIGATION     TYMPANOMASTOIDECTOMY Left 04/02/2016   Procedure: LEFT CANAL WALL DOWN TYMPANOMASTOIDECTOMY;  Surgeon: Leta Baptist, MD;  Location: [place];  Service: ENT;  Laterality: Left;   [place] Right 07/02/2017   Procedure: RIGHT NECK EXPLORATION WITH INTRAOPERATIVE ULTRASOUND;  Surgeon: Angelia Mould, MD;  Location: [facility] OR;  Service: Vascular;  Laterality: Right;     Home Medications:  Prior to Admission medications   Medication Sig Start Date End Date Taking? Authorizing Provider  acetaminophen (TYLENOL) 500 MG tablet Take 500 mg by mouth every 6 (six) hours as needed for mild pain.   Yes [provider]  albuterol (VENTOLIN HFA) 108 (90 Base) MCG/ACT inhaler INHALE 2 PUFFS INTO THE LUNGS EVERY 4 HOURS AS NEEDED FOR SHORTNESS OF BREATH. 08/28/21  Yes Gwenlyn Perking, FNP  aspirin EC 81 MG tablet Take 1 tablet (81 mg total) by mouth daily. 09/24/18  Yes Rehman, Mechele Dawley, MD  baclofen (LIORESAL) 10 MG tablet Take 1 tablet (10 mg total) by mouth 3 (three) times daily. 06/19/20   Gwenlyn Perking, FNP  carvedilol (COREG) 3.125 MG tablet TAKE 1 TABLET BY MOUTH 2 TIMES A DAY WITH MEALS. 07/30/21   Evelina Dun A, FNP  cholecalciferol (VITAMIN D) 1000 UNITS tablet Take 1,000 Units by mouth daily.    [provider]  DULoxetine (CYMBALTA) 60 MG capsule TAKE 1 CAPSULE BY MOUTH ONCE DAILY. 08/24/21   Gwenlyn Perking, FNP  empagliflozin (JARDIANCE) 10 MG TABS tablet Take 1 tablet (10 mg total) by mouth daily before breakfast. 09/12/21   Gwenlyn Perking, FNP  fluticasone (FLONASE) 50 MCG/ACT nasal spray Place 2 sprays into both nostrils daily. 07/18/21    Baruch Gouty, FNP  fluticasone (FLOVENT HFA) 44 MCG/ACT inhaler Inhale 2 puffs into the lungs 2 (two) times daily. 06/27/20   Gwenlyn Perking, FNP  furosemide (LASIX) 40 MG tablet Take 1 tablet (40 mg total) by mouth daily. 05/15/21   Gwenlyn Perking, FNP  gabapentin (NEURONTIN) 300 MG capsule Take 300 mg by mouth 3 (three) times daily as needed (pain). 01/24/20   [provider]  HYDROcodone-acetaminophen (NORCO) 10-325 MG tablet hydrocodone 10 mg-acetaminophen 325 mg tablet  Take 1 tablet 3 times a day by oral route as needed for pain for 30 days.    [provider]  lactulose (CHRONULAC) 10 GM/15ML solution TAKE 30 MLS (20 GRAM TOTAL) BY MOUTH 3 TIMES A DAY. 07/24/21   Rehman, Mechele Dawley, MD  lamoTRIgine (LAMICTAL) 200 MG tablet Take 1 tablet (200 mg total) by mouth 2 (two) times daily. 12/21/20   Gwenlyn Perking, FNP  levocetirizine (XYZAL) 5 MG tablet TAKE 1 TABLET EVERY  EVENING. 09/21/21   Gwenlyn Perking, FNP  LORazepam (ATIVAN) 0.5 MG tablet Take 1 tablet (0.5 mg total) by mouth 2 (two) times daily as needed for anxiety. 09/12/21   Gwenlyn Perking, FNP  melatonin 5 MG TABS Take 5 mg by mouth at bedtime.    [provider]  naloxone [facility]) nasal spray 4 mg/0.1 mL Place into the nose once. 07/19/20   [provider]  ondansetron (ZOFRAN) 4 MG tablet Take 1 tablet (4 mg total) by mouth every 8 (eight) hours as needed for nausea or vomiting. 09/18/20   Gwenlyn Perking, FNP  pantoprazole (PROTONIX) 40 MG tablet Take 1 tablet (40 mg total) by mouth 2 (two) times daily. 09/11/21   Carlan, Chelsea L, NP  potassium chloride SA (KLOR-CON M) 20 MEQ tablet Take 1 tablet (20 mEq total) by mouth daily. 03/26/21   Janora Norlander, DO  PRESCRIPTION MEDICATION [employer]    [provider]  sacubitril-valsartan (ENTRESTO) 24-26 MG Take 1 tablet by mouth 2 (two) times daily. 05/21/21   Satira Sark, MD    Inpatient Medications: Scheduled Meds:  potassium  chloride  40 mEq Oral Once   Continuous Infusions:  amiodarone 60 mg/hr (09/28/21 1128)   Followed by   amiodarone     PRN Meds:   Allergies:   No Known Allergies  Social History:   Social History   Socioeconomic History   Marital status: Widowed    Spouse name: Not on file   Number of children: 3   Years of education: Not on file   Highest education level: Not on file  Occupational History   Occupation: retired  Tobacco Use   Smoking status: Never   Smokeless tobacco: Never  Vaping Use   Vaping Use: Never used  Substance and Sexual Activity   Alcohol use: No    Alcohol/week: 0.0 standard drinks of alcohol   Drug use: No   Sexual activity: Not on file  Other Topics Concern   Not on file  Social History Narrative   Lives alone. Granddaughter lives across the street, her 3 children all live nearby   Social Determinants of Health   Financial Resource Strain: Medium Risk (07/11/2021)   Overall Financial Resource Strain (CARDIA)    Difficulty of Paying Living Expenses: Somewhat hard  Food Insecurity: Food Insecurity Present (07/05/2021)   Hunger Vital Sign    Worried About Running Out of Food in the Last Year: Sometimes true    Ran Out of Food in the Last Year: Never true  Transportation Needs: No Transportation Needs (07/05/2021)   PRAPARE - Hydrologist (Medical): No    Lack of Transportation (Non-Medical): No  Physical Activity: Insufficiently Active (07/05/2021)   Exercise Vital Sign    Days of Exercise per Week: 7 days    Minutes of Exercise per Session: 20 min  Stress: No Stress Concern Present (07/05/2021)   [place]    Feeling of Stress : Not at all  Social Connections: Moderately Integrated (07/05/2021)   Social Connection and Isolation Panel [NHANES]    Frequency of Communication with Friends and Family: More than three times a week    Frequency of Social  Gatherings with Friends and Family: More than three times a week    Attends Religious Services: More than 4 times per year    Active Member of [employer] or Organizations: Yes    Attends [employer]  or Organization Meetings: More than 4 times per year    Marital Status: Widowed  Intimate Partner Violence: Not At Risk (07/05/2021)   Humiliation, Afraid, Rape, and Kick questionnaire    Fear of Current or Ex-Partner: No    Emotionally Abused: No    Physically Abused: No    Sexually Abused: No    Family History:    Family History  Problem Relation Age of Onset   Heart disease Mother    Diabetes Mother    Heart disease Brother    Cancer Sister    Heart disease Brother    Heart disease Brother    Heart disease Son      ROS:  Please see the history of present illness.   All other ROS reviewed and negative.     Physical Exam/Data:   Vitals:   09/28/21 1130 09/28/21 1145 09/28/21 1200 09/28/21 1215  BP: 101/85 (!) 115/103 98/82 103/82  Pulse: (!) 161 (!) 144 (!) 166 (!) 166  Resp: 18 (!) 21 (!) 23 19  Temp:      TempSrc:      SpO2: 95% 95% 95% 97%   No intake or output data in the 24 hours ending 09/28/21 1239    09/28/2021    9:33 AM 09/12/2021    9:07 AM 08/27/2021    4:29 PM  Last 3 Weights  Weight (lbs) 175 lb 4 oz 178 lb 8 oz 174 lb 8 oz  Weight (kg) 79.493 kg 80.967 kg 79.153 kg     There is no height or weight on file to calculate BMI.  General: Pleasant female appearing in no acute distress HEENT: normal Neck: no JVD Vascular: No carotid bruits; Distal pulses 2+ bilaterally Cardiac:  normal S1, S2; regular rhythm, tachycardic rate. 2/6 murmur along Apex.  Lungs:  clear to auscultation bilaterally, no wheezing, rhonchi or rales  Abd: soft, nontender, no hepatomegaly  Ext: no pitting edema Musculoskeletal:  No deformities, BUE and BLE strength normal and equal Skin: warm and dry  Neuro:  CNs 2-12 intact, no focal abnormalities noted Psych:  Normal affect   EKG:  The EKG  was personally reviewed and demonstrates: Wide-complex tachycardia, HR 176 and felt to be consistent with SVT in the setting of her known LBBB  Relevant CV Studies:  Echocardiogram: 01/2021 IMPRESSIONS     1. Left ventricular ejection fraction, by estimation, is 55 to 60%. The  left ventricle has normal function. Left ventricular endocardial border  not optimally defined to evaluate regional wall motion. There is mild left  ventricular hypertrophy. Left  ventricular diastolic parameters are consistent with Grade I diastolic  dysfunction (impaired relaxation). Elevated left atrial pressure.   2. Right ventricular systolic function is normal. The right ventricular  size is normal. Tricuspid regurgitation signal is inadequate for assessing  PA pressure.   3. Left atrial size was mild to moderately dilated.   4. Mitral valve poorly visualized. Suggested diastolic doming of leaflets  suggesting possible rheumatic disease. . The mitral valve was not well  visualized. Mild mitral valve regurgitation. Moderate mitral stenosis. The  mean mitral valve gradient is 7.0   mmHg.   5. The aortic valve was not well visualized. Aortic valve regurgitation  is mild. No aortic stenosis is present.   6. The inferior vena cava is normal in size with greater than 50%  respiratory variability, suggesting right atrial pressure of 3 mmHg.   Laboratory Data:  High Sensitivity Troponin:   Recent Labs  Lab 09/28/21 1100  TROPONINIHS 32*     Chemistry Recent Labs  Lab 09/28/21 1055 09/28/21 1100  NA  --  142  K  --  3.4*  CL  --  109  CO2  --  23  GLUCOSE  --  249*  BUN  --  14  CREATININE  --  0.97  CALCIUM  --  9.4  MG 2.1  --   GFRNONAA  --  >60  ANIONGAP  --  10    No results for input(s): "PROT", "ALBUMIN", "AST", "ALT", "ALKPHOS", "BILITOT" in the last 168 hours. Lipids No results for input(s): "CHOL", "TRIG", "HDL", "LABVLDL", "LDLCALC", "CHOLHDL" in the last 168 hours.   Hematology Recent Labs  Lab 09/28/21 1100  WBC 11.6*  RBC 4.74  HGB 13.9  HCT 41.5  MCV 87.6  MCH 29.3  MCHC 33.5  RDW 14.5  PLT 232   Thyroid  Recent Labs  Lab 09/28/21 1055  TSH 0.744    BNPNo results for input(s): "BNP", "PROBNP" in the last 168 hours.  DDimer No results for input(s): "DDIMER" in the last 168 hours.   Radiology/Studies:  DG Chest Port 1 View  Result Date: 09/28/2021 CLINICAL DATA:  SVT, elevated heart rate in the 170 EXAM: PORTABLE CHEST 1 VIEW COMPARISON:  Radiograph 07/03/2017 FINDINGS: Unchanged enlarged cardiac silhouette. There is no focal airspace consolidation. There is no pleural effusion. No pneumothorax. There is no acute osseous abnormality. IMPRESSION: Unchanged enlarged cardiac silhouette.  No focal airspace disease. Electronically Signed   By: Maurine Simmering M.D.   On: 09/28/2021 11:11     Assessment and Plan:   1.  Wide-complex tachycardia - Unclear duration but she reports worsening dyspnea on exertion for at least the past 3 weeks which could have been the onset of her arrhythmia given that she does not check her vitals at home. - She did temporarily convert to normal sinus rhythm with Adenosine by EMS but had recurrence. Reviewed with Dr. Harl Bowie and she has been started on IV Amiodarone.  Cannot add AV nodal blocking agents currently given her SBP in the 90s. Anticipate IV Amiodarone loading for at least 24 hours. Would update an echocardiogram once her rates improve as she is at risk for a tachycardia-mediated cardiomyopathy. Will add TSH and Mg to previously drawn labs.  K+ was low at 3.4 and will try to keep K+ ~ 4.0 and Mg~ 2.0.  2. HFimpEF - By review of notes, her EF was previously reduced with cath in 2015 showing mild nonobstructive CAD. EF was normal at 55-60% by echo in 01/2021. - Reports having dyspnea on exertion but no recent orthopnea, PND or pitting edema. BNP pending. She was on Coreg, Lasix and Entresto prior to admission.  Would hold Coreg and Entresto for now given hypotension.  3. Carotid artery stenosis  - She is s/p R CEA in 2019. Followed by vascular surgery as an outpatient. Dopplers in 09/2020 showed 1 to 39% stenosis bilaterally.  4. Mitral Stenosis - Echocardiogram in 01/2021 showed mild MR and moderate MS. Would plan to obtain a follow-up echocardiogram once her rates improve.   5. Hypokalemia - K+ at 3.4. Will order supplementation.    For questions or updates, please contact [place] Please consult [email or web address] for contact info under    Signed, Erma Heritage, PA-C  09/28/2021 12:39 PM  Patient seen and discussed with PA Ahmed Prima, I agree with her documentation. 71 yo female history of HTN, asthma, OSA,  prior TIA, DM2, history of cardiomyopathy with recovered LVEF, mild MR/mod MS, NASH cirrhosis presents with tachycardia from pcp office. EMS evaluation showed a wide complex regular tachycardia. EMS gave adenosine, from strip review the rhythm did break into sinus rhythm with her underlying LBBB then recurrent wide complex tachycardia. She denies palpitations, but has had some SOB, diaphoresis, and constant left shoulder pain worst with position       TSH 0.744 Mg 2.1 K 3.4 Cr 0.97 BUN 14 WBC 11.6 Hgb 13.9 Plt 232 Trop 32--> EKG wide complex tachycardia CXR no acute process     1.Wide complex tachycardia Presents with wide complex tachycardia with same QRS morphology as her chronic LBBB. EMS strips reviewed, after adenosine in the field rhythm did slow rhythm and show P waves with LBBB temporarily, had recurrent wide complex tach (see EMS run shee with EKG strips in media section). This appears to be SVT with her chronic LBBB. Soft bp's in ER, would not tolerate av nodal agents. Started on amiodarone, would plan for short course given her history of NASH/cirrhosis.  - would repeat echo once rates controlled   2. History of cardiomyopathy - most recently normalzied LVEF - with  soft bp's would hold HF/bp meds   Addendum 350pm Patient converted to SR this afternoon. Would continue IV amio 24 hours, if stable rhythm tomorrow could transition to amio 462m bid x 1 weeks, then 2065mbid x 2 weeks, then 20057maily and plan for discharge. Will need outpatient EP f/u, would for short course of amio given liver disease. We will schedule EP appt     JonCarlyle Dolly

## 2021-09-29 ENCOUNTER — Other Ambulatory Visit (HOSPITAL_COMMUNITY): Payer: Self-pay | Admitting: *Deleted

## 2021-09-29 ENCOUNTER — Inpatient Hospital Stay (HOSPITAL_COMMUNITY): Payer: Medicare HMO

## 2021-09-29 DIAGNOSIS — E876 Hypokalemia: Secondary | ICD-10-CM

## 2021-09-29 DIAGNOSIS — K7460 Unspecified cirrhosis of liver: Secondary | ICD-10-CM | POA: Diagnosis not present

## 2021-09-29 DIAGNOSIS — I471 Supraventricular tachycardia: Secondary | ICD-10-CM | POA: Diagnosis not present

## 2021-09-29 DIAGNOSIS — R9431 Abnormal electrocardiogram [ECG] [EKG]: Secondary | ICD-10-CM | POA: Diagnosis not present

## 2021-09-29 DIAGNOSIS — I5022 Chronic systolic (congestive) heart failure: Secondary | ICD-10-CM | POA: Diagnosis not present

## 2021-09-29 LAB — HEMOGLOBIN A1C
Hgb A1c MFr Bld: 6.8 % — ABNORMAL HIGH (ref 4.8–5.6)
Mean Plasma Glucose: 148.46 mg/dL

## 2021-09-29 LAB — GLUCOSE, CAPILLARY
Glucose-Capillary: 210 mg/dL — ABNORMAL HIGH (ref 70–99)
Glucose-Capillary: 152 mg/dL — ABNORMAL HIGH (ref 70–99)
Glucose-Capillary: 134 mg/dL — ABNORMAL HIGH (ref 70–99)
Glucose-Capillary: 148 mg/dL — ABNORMAL HIGH (ref 70–99)

## 2021-09-29 LAB — ECHOCARDIOGRAM COMPLETE
Area-P 1/2: 3.77 cm2
Calc EF: 38.2 %
Height: 64 in
MV M vel: 4.57 m/s
MV Peak grad: 83.5 mmHg
MV VTI: 0.52 cm2
Radius: 0.3 cm
S' Lateral: 4 cm
Single Plane A2C EF: 44.3 %
Single Plane A4C EF: 33.7 %
Weight: 2804.25 oz

## 2021-09-29 LAB — TSH: TSH: 1.066 u[IU]/mL (ref 0.350–4.500)

## 2021-09-29 LAB — BASIC METABOLIC PANEL
Anion gap: 7 (ref 5–15)
BUN: 15 mg/dL (ref 8–23)
CO2: 26 mmol/L (ref 22–32)
Calcium: 8.8 mg/dL — ABNORMAL LOW (ref 8.9–10.3)
Chloride: 109 mmol/L (ref 98–111)
Creatinine, Ser: 0.96 mg/dL (ref 0.44–1.00)
GFR, Estimated: >60 mL/min (ref >60)
Glucose, Bld: 175 mg/dL — ABNORMAL HIGH (ref 70–99)
Potassium: 3.8 mmol/L (ref 3.5–5.1)
Sodium: 142 mmol/L (ref 135–145)

## 2021-09-29 LAB — MAGNESIUM: Magnesium: 1.9 mg/dL (ref 1.7–2.4)

## 2021-09-29 LAB — MRSA NEXT GEN BY PCR, NASAL: MRSA by PCR Next Gen: NOT DETECTED

## 2021-09-29 LAB — HIV ANTIBODY (ROUTINE TESTING W REFLEX): HIV Screen 4th Generation wRfx: NONREACTIVE

## 2021-09-29 MED ORDER — LACTULOSE 10 GM/15ML PO SOLN
20.0000 g | Freq: Three times a day (TID) | ORAL | Status: DC
  Administered 2021-09-29 – 2021-09-30 (×4): 20 g via ORAL
  Filled 2021-09-29 (×4): qty 30

## 2021-09-29 MED ORDER — AMIODARONE HCL 200 MG PO TABS
400.0000 mg | ORAL_TABLET | Freq: Two times a day (BID) | ORAL | Status: DC
  Administered 2021-09-29 – 2021-09-30 (×3): 400 mg via ORAL
  Filled 2021-09-29 (×3): qty 2

## 2021-09-29 MED ORDER — CHLORHEXIDINE GLUCONATE CLOTH 2 % EX PADS
6.0000 | MEDICATED_PAD | Freq: Every day | CUTANEOUS | Status: DC
Start: 2021-09-29 — End: 2021-09-30
  Administered 2021-09-29 – 2021-09-30 (×2): 6 via TOPICAL

## 2021-09-29 NOTE — Progress Notes (Signed)
EKG completed from monitor in room.

## 2021-09-29 NOTE — Progress Notes (Signed)
Progress Note   Patient: Carol Richmond VZC:[id number] DOB: Jun 18, 1950 DOA: 09/28/2021     1 DOS: the patient was seen and examined on 09/29/2021   Brief hospital course: 71 year old female with a history of HFrEF (35-40%) improved to 55-60% on 01/29/21 echo, nonobstructive CAD, hypertension, TIA, diabetes mellitus type 2, asthma, NASH cirrhosis, GERD, carotid artery stenosis (s/p R CEA in 2019), chronic LBBB, mitral stenosis presenting with 2 to 3-week history of dyspnea on exertion.  The patient has been using her albuterol inhaler at home without much improvement.  Over the past 1 to 2 days, her shortness of breath had worsened.  She went to see her PCP on the morning of 09/28/2021.  The patient was noted to be tachycardic with a wide-complex tachycardia in the 170s.  EMS was activated.  The patient was given  6 mg of adenosine followed by 12 mg of adenosine with temporary conversion to normal sinus rhythm but had recurrent tachycardia with heart rate in the 170's. She denies any fevers, chills, chest pain, nausea, vomiting or diarrhea, Donnell pain, dysuria, hematuria, hematochezia, melena. In the ED, the patient was afebrile and hemodynamically stable.  WBC 11.6, hemoglobin 13.9, platelets 232,000.  Sodium 142, potassium 3.4, chloride 23, BUN 14, creatinine 0.97.  Troponin was 32.  Chest x-ray showed chronic residual prominence.  EKG showed SVT with left bundle branch block.  Cardiology was consulted, and the patient was started on amiodarone drip.  Assessment and Plan:  SVT (supraventricular tachycardia) (HCC) Continue amiodarone drip-> amiodarone 464m po bid Appreciate cardiology consult TSH-> nl Echocardiogram pending   Mitral Valve Stenosis (moderate) Cardia echo pending  Hypokalemia resolved Check cmp in am   Chronic HFrEF (heart failure with reduced ejection fraction) ([place] Patient appears clinically euvolemic Continue Entresto Holding carvedilol due to low BP Continue  Jardiance   Controlled type 2 diabetes mellitus with hyperglycemia, without long-term current use of insulin (HCC) Continue Jardiance  in the setting of HFrEF NovoLog sliding scale Check hemoglobin A1c    Cirrhosis, nonalcoholic ([place] Patient follows with [facility] Appears clinically compensated Restart Lactulose 326mpo tid   Seizures (HCC) Continue Lamictal   Hypertension Holding carvedilol as BP is soft and to allow BP margin for titration of chronotropic medications   GERD (gastroesophageal reflux disease) Continue pantoprazole         Advance Care Planning: FULL CODE   Consults: cardiology   Family Communication: daughter 8/12   Severity of Illness: The appropriate patient status for this patient is INPATIENT. Inpatient status is judged to be reasonable and necessary in order to provide the required intensity of service to ensure the patient's safety. The patient's presenting symptoms, physical exam findings, and initial radiographic and laboratory data in the context of their chronic comorbidities is felt to place them at high risk for further clinical deterioration. Furthermore, it is not anticipated that the patient will be medically stable for discharge from the hospital within 2 midnights of admission.    * I certify that at the point of admission it is my clinical judgment that the patient will require inpatient hospital care spanning beyond 2 midnights from the point of admission due to high intensity of service, high risk for further deterioration and high frequency of surveillance required.*    Subjective: pt is requesting her lactulose.  Pt doing well.  Hr improved.  Pt denies fever, chills, cough, cp, palp, sob, n/v, diarrhea, dysuria.   Physical Exam: Vitals:   09/29/21 0400 09/29/21 0500  09/29/21 0600 09/29/21 0724  BP: (!) 101/53 (!) 99/36 110/62   Pulse: 91 94 88 91  Resp: (!) 24 (!) 23 (!) 23 (!) 30  Temp:    100.1 F (37.8 C)  TempSrc:    Oral   SpO2: 96% 96% 95% 100%  Weight:      Height:       Heent: anicteric Neck: no jvd Heart: rrr s1, s2, no m/g/r Lung: ctab Abd: soft, obese, nt, nd, +bs Ext: no c/c/e Skin: no spider Neuro: nonfocal  Data Reviewed: Na 142, K 3.8 Bun 15, Creatinine 0.96 Mag 1.9 Tsh 1.066    DVT prophylaxis:  Lovenox  Advance Care Planning: FULL CODE  Consults: cardiology  Family Communication: left message for Susa Day (daughter) 8/12, updating her on her mothers condition, that hr improved  Disposition: Status is: Inpatient, pt will require >2 nites stay, due to SVT requiring iv amiodarone.   Time spent: 30 minutes  Author: Jani Gravel, MD 09/29/2021 7:53 AM  For on call review [email or web address].

## 2021-09-30 DIAGNOSIS — E1165 Type 2 diabetes mellitus with hyperglycemia: Secondary | ICD-10-CM | POA: Diagnosis not present

## 2021-09-30 DIAGNOSIS — R569 Unspecified convulsions: Secondary | ICD-10-CM

## 2021-09-30 DIAGNOSIS — K219 Gastro-esophageal reflux disease without esophagitis: Secondary | ICD-10-CM

## 2021-09-30 DIAGNOSIS — I052 Rheumatic mitral stenosis with insufficiency: Secondary | ICD-10-CM | POA: Diagnosis present

## 2021-09-30 DIAGNOSIS — R000 Tachycardia, unspecified: Secondary | ICD-10-CM | POA: Diagnosis present

## 2021-09-30 DIAGNOSIS — K7460 Unspecified cirrhosis of liver: Secondary | ICD-10-CM | POA: Diagnosis not present

## 2021-09-30 DIAGNOSIS — I5022 Chronic systolic (congestive) heart failure: Secondary | ICD-10-CM | POA: Diagnosis not present

## 2021-09-30 LAB — BASIC METABOLIC PANEL
Anion gap: 7 (ref 5–15)
BUN: 11 mg/dL (ref 8–23)
CO2: 22 mmol/L (ref 22–32)
Calcium: 8.7 mg/dL — ABNORMAL LOW (ref 8.9–10.3)
Chloride: 111 mmol/L (ref 98–111)
Creatinine, Ser: 0.75 mg/dL (ref 0.44–1.00)
GFR, Estimated: >60 mL/min (ref >60)
Glucose, Bld: 167 mg/dL — ABNORMAL HIGH (ref 70–99)
Potassium: 3.1 mmol/L — ABNORMAL LOW (ref 3.5–5.1)
Sodium: 140 mmol/L (ref 135–145)

## 2021-09-30 LAB — CBC
HCT: 35.5 % — ABNORMAL LOW (ref 36.0–46.0)
Hemoglobin: 11.8 g/dL — ABNORMAL LOW (ref 12.0–15.0)
MCH: 29.2 pg (ref 26.0–34.0)
MCHC: 33.2 g/dL (ref 30.0–36.0)
MCV: 87.9 fL (ref 80.0–100.0)
Platelets: 147 10*3/uL — ABNORMAL LOW (ref 150–400)
RBC: 4.04 MIL/uL (ref 3.87–5.11)
RDW: 14.3 % (ref 11.5–15.5)
WBC: 9 10*3/uL (ref 4.0–10.5)
nRBC: 0 % (ref 0.0–0.2)

## 2021-09-30 LAB — GLUCOSE, CAPILLARY
Glucose-Capillary: 144 mg/dL — ABNORMAL HIGH (ref 70–99)
Glucose-Capillary: 145 mg/dL — ABNORMAL HIGH (ref 70–99)
Glucose-Capillary: 154 mg/dL — ABNORMAL HIGH (ref 70–99)
Glucose-Capillary: 162 mg/dL — ABNORMAL HIGH (ref 70–99)

## 2021-09-30 LAB — POTASSIUM: Potassium: 3.7 mmol/L (ref 3.5–5.1)

## 2021-09-30 MED ORDER — AMIODARONE HCL 200 MG PO TABS: ORAL_TABLET | ORAL | 0 refills | Status: DC

## 2021-09-30 MED ORDER — POTASSIUM CHLORIDE CRYS ER 20 MEQ PO TBCR: 40.0000 meq | EXTENDED_RELEASE_TABLET | Freq: Every day | ORAL | 3 refills | Status: DC

## 2021-09-30 MED ORDER — POTASSIUM CHLORIDE CRYS ER 20 MEQ PO TBCR
40.0000 meq | EXTENDED_RELEASE_TABLET | Freq: Once | ORAL | Status: AC
Start: 2021-09-30 — End: 2021-09-30
  Administered 2021-09-30: 40 meq via ORAL
  Filled 2021-09-30: qty 2

## 2021-09-30 NOTE — Discharge Summary (Addendum)
Physician Discharge Summary   Patient: Carol Richmond MRN: [id number] DOB: 1950-08-03  Admit date:     09/28/2021  Discharge date: 09/30/21  Discharge Physician: Jani Gravel   PCP: Gwenlyn Perking, FNP   Recommendations at discharge:   Wide-complex Tachycardia Amiodarone 471m po bid x 1 week then 20105mpo bid x 2 weeks and then 20066mo qday as per cardiology recommendation.  Recommended that patient not drive til seen by cardiology Please f/u with Dr. GreCristopher Peru 9/19 9:30am  Mod-Severe MR/ Mod-Severe Mitral stenosis Please f/u with SamRozann Leschesrimary cardiologist in 2 weeks  Chronic Systolic CHF (HFrEF) Cardiac echo 8/12 -> EF =35-40%, prev 55-60% (02/08/2021) Cont Entresto 24/39m29m bid Cont Lasix 40mg59mqday Cont Jardiance 10mg 4mday  Hypokalemia Repleted Increased home dose of potassium from 20meq 5m0meq p52may  Check bmp in 1 week with PCP, Tiffany Marjorie Smolder Dm2 (Hga1c=6.8 8/12) Cont Jardiance 10mg po 74m  Asthma Cont Flovent HFA 2puff bid Try to avoid Albuterol HFA use    Discharge Diagnoses: Principal Problem:   Wide-complex tachycardia Active Problems:   GERD (gastroesophageal reflux disease)   Hypertension   Seizures (HCC)   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 *  Hospital Course: 71 year o70 female with a history of HFrEF (35-40%) improved to 55-60% on 01/29/21 echo, nonobstructive CAD, hypertension, TIA, diabetes mellitus type 2, asthma, NASH cirrhosis, GERD, carotid artery stenosis (s/p R CEA in 2019), chronic LBBB, mitral stenosis presenting with 2 to 3-week history of dyspnea on  exertion.  The patient has been using her albuterol inhaler at home without much improvement.  Over the past 1 to 2 days, her shortness of breath had worsened.  She went to see her PCP on the morning of 09/28/2021.  The patient was noted to be tachycardic with a wide-complex tachycardia in the 170s.  EMS was activated.  The patient was given  6 mg of adenosine followed by 12 mg of adenosine with temporary conversion to normal sinus rhythm but had recurrent tachycardia with heart rate in the 170's. She denies any fevers, chills, chest pain, nausea, vomiting or diarrhea, Donnell pain, dysuria, hematuria, hematochezia, melena. In the ED, the patient was afebrile and hemodynamically stable.  WBC 11.6, hemoglobin 13.9, platelets 232,000.  Sodium 142, potassium 3.4, chloride 23, BUN 14, creatinine 0.97.  Troponin was 32.  Chest x-ray showed chronic residual prominence.  EKG showed SVT with left bundle branch block.  Cardiology was consulted, and the patient was started on amiodarone drip.  Assessment and Plan: Wide Complex Tachycardia Amiodarone drip-> amiodarone 400mg po b79mSH-> nl Appreciate cardiology consult Pt will taper amiodarone as above Please f/u with Samuel McDRozann Lesches TaylCristopher Peru    Mitral Valve Stenosis (moderate) Cardia echo 8/12 => MV Stenosis, now moderate-severe, MV regurgitation now moderate- severe,  w worsening EF 35-40% compared to cardiac echo 02/08/2021 EF 55-60%   Hypokalemia resolved Check bmp in 1 week w PCP   Chronic HFrEF (heart failure with reduced ejection fraction) (HCC) PatieHeadrickappears clinically euvolemic Holding carvedilol due to low BP Continue Entresto Continue Jardiance Continue Lasix    Controlled type 2  diabetes mellitus with hyperglycemia, without long-term current use of insulin (HCC) Continue Jardiance  in the setting of HFrEF    Cirrhosis, nonalcoholic ([place]) Patient follows with [facility] Appears clinically compensated Cont  Lactulose 75m po tid   Seizures (HCC) Continue Lamictal 2045mpo bid   Hypertension Holding carvedilol as BP is soft and to allow BP margin for titration of chronotropic medications   GERD (gastroesophageal reflux disease) Continue Protonix 4026mo bid   Asthma Cont Flovent HFA 2puff bid Try to avoid Albuterol HFA use      Advance Care Planning: FULL CODE   Consults: cardiology   Family Communication: daughter 8/13, reviewed medications and plans with daughter   Severity of Illness: The appropriate patient status for this patient is INPATIENT. Inpatient status is judged to be reasonable and necessary in order to provide the required intensity of service to ensure the patient's safety. The patient's presenting symptoms, physical exam findings, and initial radiographic and laboratory data in the context of their chronic comorbidities is felt to place them at high risk for further clinical deterioration. Furthermore, it is not anticipated that the patient will be medically stable for discharge from the hospital within 2 midnights of admission.    * I certify that at the point of admission it is my clinical judgment that the patient will require inpatient hospital care spanning beyond 2 midnights from the point of admission due to high intensity of service, high risk for further deterioration and high frequency of surveillance required.*    Disposition: home Diet recommendation:  Diabetic, cardiac  DISCHARGE MEDICATION: Allergies as of 09/30/2021   No Known Allergies      Medication List     STOP taking these medications    albuterol 108 (90 Base) MCG/ACT inhaler Commonly known as: VENTOLIN HFA   baclofen 10 MG tablet Commonly known as: LIORESAL   carvedilol 3.125 MG tablet Commonly known as: COREG   ondansetron 4 MG tablet Commonly known as: ZOFRAN       TAKE these medications    acetaminophen 500 MG tablet Commonly known as: TYLENOL Take 500 mg by mouth  every 6 (six) hours as needed for mild pain.   amiodarone 200 MG tablet Commonly known as: PACERONE 400m34m bid x 1 week then 200mg74mbid x2 week, then 200mg 47mday   aspirin EC 81 MG tablet Take 1 tablet (81 mg total) by mouth daily.   cholecalciferol 1000 units tablet Commonly known as: VITAMIN D Take 1,000 Units by mouth daily.   DULoxetine 60 MG capsule Commonly known as: CYMBALTA TAKE 1 CAPSULE BY MOUTH ONCE DAILY.   empagliflozin 10 MG Tabs tablet Commonly known as: Jardiance Take 1 tablet (10 mg total) by mouth daily before breakfast.   Entresto 24-26 MG Generic drug: sacubitril-valsartan Take 1 tablet by mouth 2 (two) times daily.   fluticasone 44 MCG/ACT inhaler Commonly known as: FLOVENT HFA Inhale 2 puffs into the lungs 2 (two) times daily.   fluticasone 50 MCG/ACT nasal spray Commonly known as: FLONASE Place 2 sprays into both nostrils daily. What changed:  when to take this reasons to take this   furosemide 40 MG tablet Commonly known as: LASIX Take 1 tablet (40 mg total) by mouth daily.   gabapentin 300 MG capsule Commonly known as: NEURONTIN Take 300 mg by mouth 3 (three) times daily as needed (pain).   HYDROcodone-acetaminophen 10-325 MG tablet Commonly known as: NORCO Take 1 tablet by mouth every 8 (eight)  hours as needed for moderate pain or severe pain.   lactulose 10 GM/15ML solution Commonly known as: CHRONULAC TAKE 30 MLS (20 GRAM TOTAL) BY MOUTH 3 TIMES A DAY.   lamoTRIgine 200 MG tablet Commonly known as: LAMICTAL Take 1 tablet (200 mg total) by mouth 2 (two) times daily.   levocetirizine 5 MG tablet Commonly known as: XYZAL TAKE 1 TABLET EVERY EVENING.   LORazepam 0.5 MG tablet Commonly known as: ATIVAN Take 1 tablet (0.5 mg total) by mouth 2 (two) times daily as needed for anxiety.   naloxone 4 MG/0.1ML Liqd nasal spray kit Commonly known as: NARCAN Place into the nose once.   pantoprazole 40 MG tablet Commonly known  as: PROTONIX Take 1 tablet (40 mg total) by mouth 2 (two) times daily.   potassium chloride SA 20 MEQ tablet Commonly known as: KLOR-CON M Take 2 tablets (40 mEq total) by mouth daily. What changed: how much to take        Follow-up Information     Evans Lance, MD Follow up on 11/06/2021.   Specialty: Cardiology Why: Cardiology follow-up with Dr. Lovena Le (Electrophysiologist) on 11/06/2021 at 9:30 AM. Contact information: [place] [address] [phone]         Gwenlyn Perking, FNP Follow up in 1 week(s).   Specialty: Family Medicine Contact information: [place] [address] [phone]         Satira Sark, MD Follow up in 2 week(s).   Specialty: Cardiology Contact information: [place] [address] [phone]                Discharge Exam: Filed Weights   09/28/21 1321 09/29/21 1400 09/30/21 0500  Weight: 79.5 kg 79.4 kg 78.8 kg   Heent: anicteric Neck: no jvd Heart: rrr s1, s2, 1/6 sem apex Lung: CTAB Abd: soft, obese, nt, nd, +bs Ext: no c/c/e   Condition at discharge: Improved, stable  The results of significant diagnostics from this hospitalization (including imaging, microbiology, ancillary and laboratory) are listed below for reference.   Imaging Studies: ECHOCARDIOGRAM COMPLETE  Result Date: 09/29/2021    ECHOCARDIOGRAM REPORT   Patient Name:   Carol Richmond Date of Exam: 09/29/2021 Medical Rec #:  [id number]           Height:       64.0 in Accession #:    [id number]          Weight:       175.3 lb Date of Birth:  [birth year]           BSA:          1.850 m Patient Age:    15 years            BP:           112/42 mmHg Patient Gender: F                   HR:           94 bpm. Exam Location:  [facility] Procedure: 2D Echo, 3D Echo, Cardiac Doppler and Color Doppler Indications:    R94.31 Abnormal EKG; I47.1 SVT  History:        Patient has prior history of Echocardiogram  examinations, most                 recent 01/29/2021. Cardiomyopathy, Abnormal ECG, TIA, Mitral  Valve Disease, Arrythmias:SVT; Risk Factors:Hypertension.  Sonographer:    Roseanna Rainbow RDCS Referring Phys: [phone] DAVID TAT  Sonographer Comments: Image acquisition challenging due to patient body habitus. IMPRESSIONS  1. Left ventricular ejection fraction, by estimation, is 35 to 40%. Left ventricular ejection fraction by 3D volume is 40 %. The left ventricle has moderately decreased function. The left ventricle demonstrates global hypokinesis. Left ventricular diastolic parameters are consistent with Grade I diastolic dysfunction (impaired relaxation). Elevated left atrial pressure.  2. Right ventricular systolic function is normal. The right ventricular size is normal.  3. Left atrial size was moderately dilated.  4. The mitral valve is rheumatic. Moderate to severe mitral valve regurgitation. Moderate-to-severe mitral stenosis. MVA by continuity is 1.4cm2 by continuity. The mean mitral valve gradient is 12.0 mmHg with HR 84bpm. Consider TEE for further evaluation if clinically indicated.  5. The aortic valve was not well visualized. There is mild calcification of the aortic valve. There is mild thickening of the aortic valve. Aortic valve regurgitation is mild.  6. The inferior vena cava is normal in size with greater than 50% respiratory variability, suggesting right atrial pressure of 3 mmHg. Comparison(s): Compared to prior TTE in 01/2021, the EF is now depressed at 35-40% (previously 55-60%). The mean mitral valve gradient is 90mHg at HR 84bpm. There is at least moderate-to-severe MR. Recommend TEE for further evaluation of MV if clinically indicated on nonemergent basis. FINDINGS  Left Ventricle: Left ventricular ejection fraction, by estimation, is 35 to 40%. Left ventricular ejection fraction by 3D volume is 40 %. The left ventricle has moderately decreased function. The left ventricle  demonstrates global hypokinesis. The left ventricular internal cavity size was normal in size. There is no left ventricular hypertrophy. Left ventricular diastolic parameters are consistent with Grade I diastolic dysfunction (impaired relaxation). Elevated left atrial pressure. Right Ventricle: The right ventricular size is normal. No increase in right ventricular wall thickness. Right ventricular systolic function is normal. Left Atrium: Left atrial size was moderately dilated. Right Atrium: Right atrial size was normal in size. Pericardium: There is no evidence of pericardial effusion. Mitral Valve: Mean gradient 159mg with HR 84bpm. The mitral valve is rheumatic. There is moderate thickening of the mitral valve leaflet(s). There is moderate calcification of the mitral valve leaflet(s). Moderate mitral annular calcification. Moderate to severe mitral valve regurgitation. Severe mitral valve stenosis. MV peak gradient, 51.8 mmHg. The mean mitral valve gradient is 12.0 mmHg. Tricuspid Valve: The tricuspid valve is normal in structure. Tricuspid valve regurgitation is trivial. Aortic Valve: The aortic valve was not well visualized. There is mild calcification of the aortic valve. There is mild thickening of the aortic valve. Aortic valve regurgitation is mild. Pulmonic Valve: The pulmonic valve was normal in structure. Pulmonic valve regurgitation is trivial. Aorta: The aortic root and ascending aorta are structurally normal, with no evidence of dilitation. Venous: The inferior vena cava is normal in size with greater than 50% respiratory variability, suggesting right atrial pressure of 3 mmHg. IAS/Shunts: The atrial septum is grossly normal.  LEFT VENTRICLE PLAX 2D LVIDd:         5.30 cm         Diastology LVIDs:         4.00 cm         LV e' medial:    4.58 cm/s LV PW:         0.90 cm         LV E/e' medial:  43.2 LV IVS:  1.10 cm         LV e' lateral:   5.95 cm/s LVOT diam:     2.10 cm         LV E/e'  lateral: 33.3 LV SV:         82 LV SV Index:   44 LVOT Area:     3.46 cm        3D Volume EF                                LV 3D EF:    Left                                             ventricul LV Volumes (MOD)                            ar LV vol d, MOD    110.0 ml                   ejection A2C:                                        fraction LV vol d, MOD    118.0 ml                   by 3D A4C:                                        volume is LV vol s, MOD    61.3 ml                    40 %. A2C: LV vol s, MOD    78.2 ml A4C:                           3D Volume EF: LV SV MOD A2C:   48.7 ml       3D EF:        40 % LV SV MOD A4C:   118.0 ml      LV EDV:       183 ml LV SV MOD BP:    44.1 ml       LV ESV:       110 ml                                LV SV:        73 ml RIGHT VENTRICLE            IVC RV S prime:     9.23 cm/s  IVC diam: 1.90 cm TAPSE (M-mode): 1.8 cm LEFT ATRIUM             Index        RIGHT ATRIUM           Index LA diam:        4.20 cm 2.27 cm/m   RA Area:  13.70 cm LA Vol (A2C):   51.5 ml 27.84 ml/m  RA Volume:   32.90 ml  17.79 ml/m LA Vol (A4C):   70.8 ml 38.28 ml/m LA Biplane Vol: 61.7 ml 33.36 ml/m  AORTIC VALVE LVOT Vmax:   143.00 cm/s LVOT Vmean:  91.300 cm/s LVOT VTI:    0.237 m  AORTA Ao Root diam: 3.10 cm Ao Asc diam:  3.30 cm MITRAL VALVE MV Area (PHT): 3.77 cm       SHUNTS MV Area VTI:   0.52 cm       Systemic VTI:  0.24 m MV Peak grad:  51.8 mmHg      Systemic Diam: 2.10 cm MV Mean grad:  12.0 mmHg MV Vmax:       3.60 m/s MV Vmean:      273.0 cm/s MV Decel Time: 201 msec MR Peak grad:    83.5 mmHg MR Mean grad:    63.0 mmHg MR Vmax:         457.00 cm/s MR Vmean:        387.0 cm/s MR PISA:         0.57 cm MR PISA Eff ROA: 5 mm MR PISA Radius:  0.30 cm MV E velocity: 198.00 cm/s MV A velocity: 219.00 cm/s MV E/A ratio:  0.90 Gwyndolyn Kaufman MD Electronically signed by Gwyndolyn Kaufman MD Signature Date/Time: 09/29/2021/1:45:38 PM    Final    DG Chest Port 1  View  Result Date: 09/28/2021 CLINICAL DATA:  SVT, elevated heart rate in the 170 EXAM: PORTABLE CHEST 1 VIEW COMPARISON:  Radiograph 07/03/2017 FINDINGS: Unchanged enlarged cardiac silhouette. There is no focal airspace consolidation. There is no pleural effusion. No pneumothorax. There is no acute osseous abnormality. IMPRESSION: Unchanged enlarged cardiac silhouette.  No focal airspace disease. Electronically Signed   By: Maurine Simmering M.D.   On: 09/28/2021 11:11   Color Fundus Photography Optos - OU - Both Eyes  Result Date: 09/20/2021 Right Eye Progression has been stable. Left Eye Progression has been stable. Notes Clear media OD.  Peripheral PRP for previous old ischemic CRV O now controlled compensated. OS incidental PVD with only moderate NPDR no active maculopathy or peripheral disease  OCT, Retina - OU - Both Eyes  Result Date: 09/20/2021 Right Eye Quality was good. Scan locations included subfoveal. Central Foveal Thickness: 276. Progression has been stable. Findings include normal foveal contour. Left Eye Quality was good. Scan locations included subfoveal. Central Foveal Thickness: 283. Progression has been stable. Findings include normal foveal contour. Notes No active maculopathy incidental PVD OU   Microbiology: Results for orders placed or performed during the hospital encounter of 09/28/21  MRSA Next Gen by PCR, Nasal     Status: None   Collection Time: 09/29/21  9:57 AM   Specimen: Nasal Mucosa; Nasal Swab  Result Value Ref Range Status   MRSA by PCR Next Gen NOT DETECTED NOT DETECTED Final    Comment: (NOTE) The GeneXpert MRSA Assay (FDA approved for NASAL specimens only), is one component of a comprehensive MRSA colonization surveillance program. It is not intended to diagnose MRSA infection nor to guide or monitor treatment for MRSA infections. Test performance is not FDA approved in patients less than 60 years old. Performed at [facility], [address],  [place], [place] [address]     Labs: CBC: Recent Labs  Lab 09/28/21 1100 09/30/21 0612  WBC 11.6* 9.0  HGB 13.9 11.8*  HCT 41.5 35.5*  MCV 87.6 87.9  PLT 232 290*   Basic Metabolic Panel: Recent Labs  Lab 09/28/21 1055 09/28/21 1100 09/29/21 0332 09/30/21 0612 09/30/21 1307  NA  --  142 142 140  --   K  --  3.4* 3.8 3.1* 3.7  CL  --  109 109 111  --   CO2  --  23 26 22   --   GLUCOSE  --  249* 175* 167*  --   BUN  --  14 15 11   --   CREATININE  --  0.97 0.96 0.75  --   CALCIUM  --  9.4 8.8* 8.7*  --   MG 2.1  --  1.9  --   --    Liver Function Tests: No results for input(s): "AST", "ALT", "ALKPHOS", "BILITOT", "PROT", "ALBUMIN" in the last 168 hours. CBG: Recent Labs  Lab 09/29/21 2007 09/30/21 0008 09/30/21 0430 09/30/21 0736 09/30/21 1219  GLUCAP 148* 144* 162* 154* 145*    Discharge time spent: 40 minutes.  Signed: Jani Gravel, MD [facility] 09/30/2021

## 2021-09-30 NOTE — Assessment & Plan Note (Addendum)
Moderate to severe MR, and moderate to severe Mitral Stenosis on cardiac echo 09/29/2021

## 2021-09-30 NOTE — Plan of Care (Signed)

## 2021-09-30 NOTE — Discharge Instructions (Addendum)
Please call your primary care to follow up on your potassium in 1week, and please call Dr. Myles Gip office to follow up on your Mitral valve stenosis and regurgitation as well as heart arrythmia (wide complex tachycardia) Please do not drive until you see cardiology

## 2021-10-01 ENCOUNTER — Other Ambulatory Visit: Payer: Self-pay | Admitting: *Deleted

## 2021-10-01 ENCOUNTER — Encounter (INDEPENDENT_AMBULATORY_CARE_PROVIDER_SITE_OTHER): Payer: Self-pay | Admitting: Gastroenterology

## 2021-10-01 ENCOUNTER — Ambulatory Visit (INDEPENDENT_AMBULATORY_CARE_PROVIDER_SITE_OTHER): Payer: Medicare HMO | Admitting: Gastroenterology

## 2021-10-01 VITALS — BP 101/67 | HR 81 | Temp 97.6°F | Ht 64.0 in | Wt 174.7 lb

## 2021-10-01 DIAGNOSIS — K7460 Unspecified cirrhosis of liver: Secondary | ICD-10-CM

## 2021-10-01 NOTE — Progress Notes (Signed)
Carol Richmond, M.D. Gastroenterology & Hepatology [facility] [address] [place], [place] [address]  Primary Care Physician: Gwenlyn Perking, [place] [address]  I will communicate my assessment and recommendations to the referring MD via EMR.  Problems: NASH cirrhosis Grade 1 EV  History of Present Illness: Carol Richmond is a 71 y.o. female with PMH NASH cirrhosis with G1 EV, GERD, SVT who presents for follow up of cirrhosis and abdominal pain.  The patient was last seen on 08/02/2021. At that time, the patient had Protonix increased to 40 mg twice a day and was given a prescription for Carafate for management of abdominal pain.  Patient states she was recently admitted to the ER at [facility] on 09/28/2021 for evaluation of chest and abdominal pain. She was found to have wide complex tachycardia, which was treated with amiodarone and is currently taking 200 mg BID. She is supposed to see Dr. Lovena Le in September. States that since she started the amiodarone, the abdominal pain resolved. She actually stopped taking pantoprazole as it was causing diarrhea.  She denies any complaints at the moment. The patient denies having any nausea, vomiting, fever, chills, hematochezia, melena, hematemesis, abdominal distention, abdominal pain, diarrhea, jaundice, pruritus or weight loss.  Cirrhosis related questions: Hematemesis/coffee ground emesis: No History of variceal bleeding: No Abdominal pain: No Abdominal distention/worsening ascitesNo Fever/chills: No Episodes of confusion/disorientation: Not recently Number of daily bowel movements:3-4 times, takes lactulose 10 g TID Taking diuretics?: Yes, furosemide 40 mg qday Prior history of banding?: No Prior episodes of SBP: No Last time liver imaging was performed:04/16/2021 - Doppler, no masses, adequate flow MELD score: 04/2021 - 7  Does not drink alcohol.  Last  Colonoscopy:2019  normal colonoscopy, recommend repeat in 5 years Last Endoscopy:- 03/07/21 Normal hypopharynx. - Normal proximal esophagus and mid esophagus. - Grade I esophageal varices. - Z-line irregular, 37 cm from the incisors. - Portal hypertensive gastropathy. - Food was found in the stomach. - Erosive gastropathy with no stigmata of recent bleeding(secondary to NSAID; gastric biopsy negative for H. pylori in August 2020) - Normal duodenal bulb and second portion of the duodenum. - No specimens collected.  Past Medical History: Past Medical History:  Diagnosis Date   Asthma    Carotid artery disease ([place])    Diverticulosis of colon (without mention of hemorrhage)    Essential hypertension    Functional ovarian cysts    GERD (gastroesophageal reflux disease)    Hemorrhoids, external, without mention of complication    Hormone replacement therapy (postmenopausal)    IBS (irritable bowel syndrome)    Migraine    Mitral valve disease    Right knee DJD    Seizures (HCC)    Sleep apnea    TIA (transient ischemic attack)    Type 2 diabetes mellitus ([place])    Type 2 diabetes mellitus with hyperglycemia, without long-term current use of insulin ([place]) 08/07/2017    Past Surgical History: Past Surgical History:  Procedure Laterality Date   BIOPSY  09/23/2018   Procedure: BIOPSY;  Surgeon: Rogene Houston, MD;  [facility];  Service: Endoscopy;;  duodeneum and stomach   CHOLECYSTECTOMY  1980   ENDARTERECTOMY Right 07/01/2017   Procedure: ENDARTERECTOMY CAROTID RIGHT;  Surgeon: Serafina Mitchell, MD;  Location: [facility] OR;  Service: Vascular;  Laterality: Right;   ESOPHAGOGASTRODUODENOSCOPY N/A 09/23/2018   Procedure: ESOPHAGOGASTRODUODENOSCOPY (EGD);  Surgeon: Rogene Houston, MD;  [facility]  [facility];  Service: Endoscopy;  Laterality: N/A;  2:45-moved to 1:45 per Lelon Frohlich   ESOPHAGOGASTRODUODENOSCOPY (EGD) WITH PROPOFOL N/A 03/07/2021   Procedure: ESOPHAGOGASTRODUODENOSCOPY  (EGD) WITH PROPOFOL;  Surgeon: Rogene Houston, MD;  [facility];  Service: Endoscopy;  Laterality: N/A;  12:50   LEFT HEART CATHETERIZATION WITH CORONARY ANGIOGRAM N/A 11/18/2013   Procedure: LEFT HEART CATHETERIZATION WITH CORONARY ANGIOGRAM;  Surgeon: Burnell Blanks, MD;  Location: [facility] CATH LAB;  Service: Cardiovascular;  Laterality: N/A;   OOPHORECTOMY     2012 Dr. Adah Perl in [place] Right 07/01/2017   Procedure: RIGHT CAROTID PATCH ANGIOPLASTY;  Surgeon: Serafina Mitchell, MD;  Location: [place];  Service: Vascular;  Laterality: Right;   TONSILLECTOMY     TOTAL KNEE ARTHROPLASTY Right 09/02/2012   Procedure: TOTAL KNEE ARTHROPLASTY- right;  Surgeon: Ninetta Lights, MD;  Location: [place];  Service: Orthopedics;  Laterality: Right;   TUBAL LIGATION     TYMPANOMASTOIDECTOMY Left 04/02/2016   Procedure: LEFT CANAL WALL DOWN TYMPANOMASTOIDECTOMY;  Surgeon: Leta Baptist, MD;  Location: [place];  Service: ENT;  Laterality: Left;   VAGINAL HYSTERECTOMY  1990   WOUND EXPLORATION Right 07/02/2017   Procedure: RIGHT NECK EXPLORATION WITH INTRAOPERATIVE ULTRASOUND;  Surgeon: Angelia Mould, MD;  Location: [facility] OR;  Service: Vascular;  Laterality: Right;    Family History: Family History  Problem Relation Age of Onset   Heart disease Mother    Diabetes Mother    Heart disease Brother    Cancer Sister    Heart disease Brother    Heart disease Brother    Heart disease Son     Social History: Social History   Tobacco Use  Smoking Status Never  Smokeless Tobacco Never   Social History   Substance and Sexual Activity  Alcohol Use No   Alcohol/week: 0.0 standard drinks of alcohol   Social History   Substance and Sexual Activity  Drug Use No    Allergies: No Known Allergies  Medications: Current Outpatient Medications  Medication Sig Dispense Refill   acetaminophen (TYLENOL) 500 MG tablet Take 500 mg by mouth every 6 (six) hours  as needed for mild pain.     amiodarone (PACERONE) 200 MG tablet 435m po bid x 1 week then 2055mpo bid x2 week, then 20073mo qday 84 tablet 0   aspirin EC 81 MG tablet Take 1 tablet (81 mg total) by mouth daily.     cholecalciferol (VITAMIN D) 1000 UNITS tablet Take 1,000 Units by mouth daily.     DULoxetine (CYMBALTA) 60 MG capsule TAKE 1 CAPSULE BY MOUTH ONCE DAILY. 30 capsule 2   empagliflozin (JARDIANCE) 10 MG TABS tablet Take 1 tablet (10 mg total) by mouth daily before breakfast. 90 tablet 3   fluticasone (FLONASE) 50 MCG/ACT nasal spray Place 2 sprays into both nostrils daily. (Patient taking differently: Place 2 sprays into both nostrils daily as needed for allergies or rhinitis.) 16 g 6   furosemide (LASIX) 40 MG tablet Take 1 tablet (40 mg total) by mouth daily. 90 tablet 1   gabapentin (NEURONTIN) 300 MG capsule Take 300 mg by mouth 3 (three) times daily as needed (pain).     HYDROcodone-acetaminophen (NORCO) 10-325 MG tablet Take 1 tablet by mouth every 8 (eight) hours as needed for moderate pain or severe pain.     lactulose (CHRONULAC) 10 GM/15ML solution TAKE 30 MLS (20 GRAM TOTAL) BY MOUTH 3 TIMES  A DAY. 2700 mL 5   lamoTRIgine (LAMICTAL) 200 MG tablet Take 1 tablet (200 mg total) by mouth 2 (two) times daily. 180 tablet 3   levocetirizine (XYZAL) 5 MG tablet TAKE 1 TABLET EVERY EVENING. 30 tablet 5   LORazepam (ATIVAN) 0.5 MG tablet Take 1 tablet (0.5 mg total) by mouth 2 (two) times daily as needed for anxiety. 30 tablet 5   pantoprazole (PROTONIX) 40 MG tablet Take 1 tablet (40 mg total) by mouth 2 (two) times daily. 180 tablet 0   potassium chloride SA (KLOR-CON M) 20 MEQ tablet Take 2 tablets (40 mEq total) by mouth daily. 90 tablet 3   sacubitril-valsartan (ENTRESTO) 24-26 MG Take 1 tablet by mouth 2 (two) times daily. 180 tablet 3   naloxone (NARCAN) nasal spray 4 mg/0.1 mL Place into the nose once. (Patient not taking: Reported on 10/01/2021)     No current  facility-administered medications for this visit.    Review of Systems: GENERAL: negative for malaise, night sweats HEENT: No changes in hearing or vision, no nose bleeds or other nasal problems. NECK: Negative for lumps, goiter, pain and significant neck swelling RESPIRATORY: Negative for cough, wheezing CARDIOVASCULAR: Negative for chest pain, leg swelling, palpitations, orthopnea GI: SEE HPI MUSCULOSKELETAL: Negative for joint pain or swelling, back pain, and muscle pain. SKIN: Negative for lesions, rash PSYCH: Negative for sleep disturbance, mood disorder and recent psychosocial stressors. HEMATOLOGY Negative for prolonged bleeding, bruising easily, and swollen nodes. ENDOCRINE: Negative for cold or heat intolerance, polyuria, polydipsia and goiter. NEURO: negative for tremor, gait imbalance, syncope and seizures. The remainder of the review of systems is noncontributory.   Physical Exam: BP 101/67 (BP Location: Left Arm, Patient Position: Sitting, Cuff Size: Large)   Pulse 81   Temp 97.6 F (36.4 C) (Oral)   Ht 5' 4"  (1.626 m)   Wt 174 lb 11.2 oz (79.2 kg)   BMI 29.99 kg/m  GENERAL: The patient is AO x3, in no acute distress. HEENT: Head is normocephalic and atraumatic. EOMI are intact. Mouth is well hydrated and without lesions. NECK: Supple. No masses LUNGS: Clear to auscultation. No presence of rhonchi/wheezing/rales. Adequate chest expansion HEART: RRR, normal s1 and s2. ABDOMEN: Soft, nontender, no guarding, no peritoneal signs, and nondistended. BS +. No masses. EXTREMITIES: Without any cyanosis, clubbing, rash, lesions or edema. NEUROLOGIC: AOx3, no focal motor deficit. SKIN: no jaundice, no rashes  Imaging/Labs: as above  I personally reviewed and interpreted the available labs, imaging and endoscopic files.  Impression and Plan: Carol Richmond is a 71 y.o. female with PMH NASH cirrhosis with G1 EV, GERD, SVT who presents for follow up of cirrhosis and  abdominal pain. Patient has presented improvement of her abdominal pain after her SVT got controlled, so no further workup is warranted. She has not presented any decompensating events and has been relatively stable with a low MELD score, although there is some portal hypertension as evidenced by the presence of grade 1 EV. We will recheck labs and proceed with repeat US for [facility] screening.  - Check CBC, MELD labs and AFP in September - Schedule liver US - Reduce salt intake to <2 g per day - Can take Tylenol max of 2 g per day (650 mg q8h) for pain - Avoid NSAIDs for pain - Avoid eating raw oysters/shellfish - Protein shake (Ensure or Boost) every night before going to sleep  All questions were answered.      Harvel Quale, MD Gastroenterology  and Hepatology [facility]

## 2021-10-01 NOTE — Patient Instructions (Addendum)
-   Check CBC, MELD labs and AFP in September - Schedule liver US - Reduce salt intake to <2 g per day - Can take Tylenol max of 2 g per day (650 mg q8h) for pain - Avoid NSAIDs for pain - Avoid eating raw oysters/shellfish - Protein shake (Ensure or Boost) every night before going to sleep

## 2021-10-01 NOTE — Patient Outreach (Signed)
  Care Coordination [facility] Note Transition Care Management Unsuccessful Follow-up Telephone Call  Date of discharge and from where:  09[facility]  Attempts:  1st Attempt  Reason for unsuccessful TCM follow-up call:  Left voice message.  Emelia Loron RN, BSN [place] [phone] [email or web address]

## 2021-10-02 ENCOUNTER — Encounter: Payer: Self-pay | Admitting: *Deleted

## 2021-10-02 ENCOUNTER — Telehealth: Payer: Self-pay | Admitting: *Deleted

## 2021-10-02 NOTE — Patient Outreach (Signed)
  Care Coordination [facility] Note Transition Care Management Follow-up Telephone Call Date of discharge and from where: 09/30/21 [facility] How have you been since you were released from the hospital? "Much better" Any questions or concerns? No  Items Reviewed: Did the pt receive and understand the discharge instructions provided? Yes  Medications obtained and verified? Yes  Other? Yes . Reviewed discharge instructions to not drive until cleared by Dr Lovena Le on 11/06/21. Also reviewed GI instructions from visit on 10/01/21. Any new allergies since your discharge? No  Dietary orders reviewed? Yes Do you have support at home? Yes   Home Care and Equipment/Supplies: Were home health services ordered? no If so, what is the name of the agency? N/A  Has the agency set up a time to come to the patient's home? not applicable Were any new equipment or medical supplies ordered?  No What is the name of the medical supply agency? N/a Were you able to get the supplies/equipment? not applicable Do you have any questions related to the use of the equipment or supplies? No  Functional Questionnaire: (I = Independent and D = Dependent) ADLs: I  Bathing/Dressing- I  Meal Prep- I  Eating- I  Maintaining continence- I  Transferring/Ambulation- I  Managing Meds- I  Follow up appointments reviewed:  PCP Hospital f/u appt confirmed? Yes  Scheduled to see Marjorie Smolder, FNP on 10/03/21 @ 4:00. [facility] f/u appt confirmed? Yes  Scheduled to see Dr Domenic Polite (cardio) on 10/10/21 @ 9:00 and Dr Lovena Le (cardio) on 11/06/21 at 9:30. Are transportation arrangements needed? No  If their condition worsens, is the pt aware to call PCP or go to the Emergency Dept.? Yes Was the patient provided with contact information for the PCP's office or ED? Yes Was to pt encouraged to call back with questions or concerns? Yes  SDOH assessments and interventions completed:   Yes  Care Coordination Interventions  Activated:  No   Care Coordination Interventions:   Patient declined Care coordination services     Encounter Outcome:  Pt. Visit Completed    Chong Sicilian, BSN, RN-BC RN Care Coordinator Direct Dial: [phone]

## 2021-10-03 ENCOUNTER — Ambulatory Visit (INDEPENDENT_AMBULATORY_CARE_PROVIDER_SITE_OTHER): Payer: Medicare HMO | Admitting: Family Medicine

## 2021-10-03 ENCOUNTER — Encounter: Payer: Self-pay | Admitting: Family Medicine

## 2021-10-03 VITALS — BP 115/65 | HR 76 | Temp 98.0°F | Ht 64.0 in | Wt 176.4 lb

## 2021-10-03 DIAGNOSIS — Z7689 Persons encountering health services in other specified circumstances: Secondary | ICD-10-CM

## 2021-10-03 DIAGNOSIS — R000 Tachycardia, unspecified: Secondary | ICD-10-CM

## 2021-10-03 DIAGNOSIS — E876 Hypokalemia: Secondary | ICD-10-CM

## 2021-10-03 DIAGNOSIS — I10 Essential (primary) hypertension: Secondary | ICD-10-CM

## 2021-10-03 DIAGNOSIS — Z09 Encounter for follow-up examination after completed treatment for conditions other than malignant neoplasm: Secondary | ICD-10-CM | POA: Diagnosis not present

## 2021-10-03 DIAGNOSIS — I050 Rheumatic mitral stenosis: Secondary | ICD-10-CM

## 2021-10-03 DIAGNOSIS — I5022 Chronic systolic (congestive) heart failure: Secondary | ICD-10-CM | POA: Diagnosis not present

## 2021-10-03 NOTE — Progress Notes (Signed)
   Established Patient Office Visit  Subjective   [name], female    DOB: [birth year]  Age: 71 y.o. MRN: [id number]  Chief Complaint  Patient presents with  . Transitions Of Care    HPI  Today's visit was for Transitional Care Management.  The patient was discharged from [facility] on 09/30/21 with a primary diagnosis of wide complex tachycardia.   Contact with the patient and/or caregiver, by a clinical staff member, was made on 10/02/21 and was documented as a telephone encounter within the EMR.  Through chart review and discussion with the patient I have determined that management of their condition is of moderate complexity.       Past Medical History:  Diagnosis Date  . Asthma   . Carotid artery disease ([place])   . Diverticulosis of colon (without mention of hemorrhage)   . Essential hypertension   . Functional ovarian cysts   . GERD (gastroesophageal reflux disease)   . Hemorrhoids, external, without mention of complication   . Hormone replacement therapy (postmenopausal)   . IBS (irritable bowel syndrome)   . Migraine   . Mitral valve disease   . Right knee DJD   . Seizures ([place])   . Sleep apnea   . TIA (transient ischemic attack)   . Type 2 diabetes mellitus ([place])   . Type 2 diabetes mellitus with hyperglycemia, without long-term current use of insulin (HCC) 08/07/2017      ROS    Objective:     BP 115/65   Pulse 76   Temp 98 F (36.7 C) (Temporal)   Ht 5' 4"  (1.626 m)   Wt 176 lb 6 oz (80 kg)   SpO2 97%   BMI 30.27 kg/m  {Vitals History (Optional):23777}  Physical Exam   No results found for any visits on 10/03/21.  {Labs (Optional):23779}  The 10-year ASCVD risk score (Arnett DK, et al., 2019) is: 18.1%    Assessment & Plan:   Problem List Items Addressed This Visit   None   No follow-ups on file.    Gwenlyn Perking, FNP

## 2021-10-03 NOTE — Patient Instructions (Addendum)
Come on Monday for labs.   Supraventricular Tachycardia, Adult Supraventricular tachycardia (SVT) is a type of abnormal heart rhythm. It causes the heart to beat very quickly. SVT can start suddenly and last for a short time, which is called paroxysmal SVT, or it may last longer and require specialized treatment to return the heart rhythm to normal. A normal resting heart rate is 60-100 beats per minute. During an episode of SVT, your heart rate may be higher than 150 beats per minute. Episodes of SVT can be frightening, but they are usually not dangerous. However, if episodes happen several times a day or last longer than a few seconds, they may lead to heart failure. What are the causes?  Usually, a normal heartbeat starts when an area called the sinoatrial node releases an electrical signal. In SVT, other areas of the heart send out electrical signals that interfere with the signal from the sinoatrial node. The cause of this abnormal electrical activity is not known. What increases the risk? You are more likely to develop this condition if you are: Middle aged or younger. Female. The following factors may also make you more likely to develop this condition: Stress or anxiety. Tiredness. Smoking. Stimulant drugs, such as cocaine and methamphetamine. Alcohol. Caffeine. Pregnancy. Having any of these conditions: A thyroid condition. Diabetes mellitus. Obstructive sleep apnea. What are the signs or symptoms? Symptoms of this condition include: A pounding heart. A feeling that the heart is skipping beats (palpitations). Weakness. Shortness of breath. Tightness or pain in your chest. Light-headedness or dizziness. Anxiety. Sweating. Nausea. Fainting. Fatigue or tiredness. A mild episode may not cause symptoms. How is this diagnosed? This condition may be diagnosed based on: Your symptoms. A physical exam. If you have an episode of SVT during the exam, the health care provider  may be able to diagnose SVT by listening to your heart and feeling your pulse. Tests. These may include: An electrocardiogram (ECG). This test is done to check for problems with electrical activity in the heart. A Holter monitor or event monitor test. This test involves wearing a portable device that monitors your heart rate over time. An echocardiogram. This test involves taking an image of your heart using sound waves. It is done to rule out other causes of a fast heart rate. A stress echocardiogram. This test involves doing an echocardiogram when you are at rest and after exercise. Blood tests. An electrophysiology study (EPS). This tests the electrical activity in your heart to find where the abnormal heart rhythm is coming from using cardiac catheters. How is this treated? This condition may be treated with: Vagal nerve stimulation. This involves stimulating your vagus nerve, which is a nerve that runs from the chest, through the neck, to the lower part of the brain. Stimulating this nerve can slow down the heart. It is often the first and only treatment that is needed for this condition. Work with your health care provider to find which technique works best for you. Ways to do this treatment include: Laying on your back, then holding your breath and pushing, as though you are having a bowel movement. Massaging an area on one side of your neck, below your jaw. Do not try this yourself. Only a health care provider should do this. If done the wrong way, it can lead to a stroke. Bending forward with your head between your legs. Coughing while bending forward with your head between your legs. Applying an ice-cold, wet towel to your face. Medicines  that prevent attacks. Medicine to stop an attack. The medicine is given through an IV at the hospital. A small electric shock (cardioversion) that stops an attack. Before you get the shock, you will get medicine to make you fall asleep. Radiofrequency  ablation. In this procedure, a small, thin tube (catheter) is used to send radiofrequency energy to the area of tissue that is causing the rapid heartbeats. The energy kills the cells and helps your heart keep a normal rhythm. You may have this treatment if you have symptoms of SVT often. If you do not have symptoms, you may not need treatment. Follow these instructions at home: Stress Avoid stressful situations when possible. Find healthy ways of managing stress, such as: Taking part in relaxing activities, such as yoga, meditation, or being out in nature. Listening to relaxing music. Practicing relaxation techniques, such as deep breathing. Leading a healthy lifestyle. This involves getting plenty of sleep, exercising, and eating a balanced diet. Attending counseling or talk therapy with a mental health professional. Lifestyle  Try to get at least 7 hours of sleep each night. Do not use any products that contain nicotine or tobacco. These products include cigarettes, chewing tobacco, and vaping devices, such as e-cigarettes. If you need help quitting, ask your health care provider. Do not drink alcohol if it triggers episodes of SVT. If alcohol does not seem to trigger episodes, limit your alcohol intake. If you drink alcohol: Limit how much you have to: 0-1 drink a day for women who are not pregnant. 0-2 drinks a day for men. Know how much alcohol is in your drink. In the [place] one drink equals one 12 oz bottle of beer (355 mL), one 5 oz glass of wine (148 mL), or one 1 oz glass of hard liquor (44 mL). Be aware of how caffeine affects your condition. If caffeine: Triggers episodes of SVT, do not eat, drink, or use anything with caffeine in it. Does not seem to trigger episodes, consume caffeine in moderation. Do not use stimulant drugs. If you need help quitting, talk with your health care provider. General instructions Maintain a healthy weight. Exercise regularly. Ask your health  care provider to suggest some good activities for you. Aim for one or a combination of the following: 150 minutes per week of moderate exercise, such as walking or yoga. 75 minutes per week of vigorous exercise, such as running or swimming. Perform vagus nerve stimulation as directed by your health care provider. Take over-the-counter and prescription medicines only as told by your health care provider. Keep all follow-up visits. This is important. Contact a health care provider if: You have episodes of SVT more often than before. Episodes of SVT last longer than before. Vagus nerve stimulation is no longer helping. You have new symptoms. Get help right away if: You have chest pain. Your symptoms get worse. You have trouble breathing. You have an episode of SVT that lasts longer than 20 minutes. You faint. These symptoms may represent a serious problem that is an emergency. Do not wait to see if the symptoms will go away. Get medical help right away. Call your local emergency services (911 in the [place] Do not drive yourself to the hospital. Summary Supraventricular tachycardia (SVT) is a type of abnormal heart rhythm. During an episode of SVT, your heart rate may be higher than 150 beats per minute. If you do not have symptoms, you may not need treatment. This information is not intended to replace advice given to you  by your health care provider. Make sure you discuss any questions you have with your health care provider. Document Revised: 09/18/2019 Document Reviewed: 09/18/2019 Elsevier Patient Education  [place].

## 2021-10-08 ENCOUNTER — Other Ambulatory Visit: Payer: Medicare HMO

## 2021-10-08 DIAGNOSIS — I10 Essential (primary) hypertension: Secondary | ICD-10-CM

## 2021-10-08 DIAGNOSIS — R000 Tachycardia, unspecified: Secondary | ICD-10-CM

## 2021-10-08 DIAGNOSIS — E876 Hypokalemia: Secondary | ICD-10-CM | POA: Diagnosis not present

## 2021-10-08 LAB — BMP8+EGFR
BUN/Creatinine Ratio: 17 (ref 12–28)
BUN: 17 mg/dL (ref 8–27)
CO2: 21 mmol/L (ref 20–29)
Calcium: 10.1 mg/dL (ref 8.7–10.3)
Chloride: 102 mmol/L (ref 96–106)
Creatinine, Ser: 0.99 mg/dL (ref 0.57–1.00)
Glucose: 278 mg/dL — ABNORMAL HIGH (ref 70–99)
Potassium: 4.3 mmol/L (ref 3.5–5.2)
Sodium: 140 mmol/L (ref 134–144)
eGFR: 62 mL/min/{1.73_m2} (ref >59)

## 2021-10-09 ENCOUNTER — Telehealth: Payer: Self-pay | Admitting: Family Medicine

## 2021-10-09 NOTE — Progress Notes (Unsigned)
Cardiology Office Note  Date: 10/10/2021   ID: Carol Richmond, Carol Richmond 11-20-1950, MRN [id number]  PCP:  Gwenlyn Perking, FNP  Cardiologist:  Rozann Lesches, MD Electrophysiologist:  None   Chief Complaint  Patient presents with   Cardiac follow-up    History of Present Illness: Carol Richmond is a 71 y.o. female last seen in May.  She presents now for a posthospital follow-up.  I reviewed interval records.  She was evaluated for wide-complex tachycardia consistent with SVT with left bundle branch block pattern, initially managed with adenosine however ultimately requiring amiodarone for suppression.  She was seen in consultation by Dr. Harl Bowie and transitioned to an oral amiodarone taper with plan for temporary treatment and evaluation by EP as an outpatient.  She did have a follow-up echocardiogram which showed decrease in LVEF to the range of 35 to 40% compared to last assessment, also moderate to severe mitral regurgitation and moderate to severe mitral stenosis with mean mitral gradient of 12 mmHg and MVA by continuity equation of 1.4 cm.  She is here today with her daughter for a follow-up visit.  She tells me that she is scared about her health situation overall.  Does not report any palpitations now and with NYHA class II dyspnea with basic activities.  No exertional chest pain and no syncope.  I went over her medications and we discussed readdition of low-dose Coreg.  Remainder of cardiomyopathy regimen includes Entresto, Lasix with potassium supplement, and Jardiance.  I did talk with her about the progressive nature of her rheumatic mitral valve disease and likelihood that further discussion with surgeon would be needed at some point to clarify her candidacy and risk for mitral valve replacement.  She would be relatively high risk now in light of cardiomyopathy, so in general I would prefer to modify her medications as best we can, ultimately plan on a TEE, and then  discuss whether referral to TCTS would be considered.  In the meanwhile, hopefully her heart rhythm will stabilize.  Personally reviewed her ECG today which shows a probable ectopic atrial rhythm with heart rate 89 and left bundle branch block which is old.  Past Medical History:  Diagnosis Date   Asthma    Carotid artery disease ([place])    Diverticulosis of colon (without mention of hemorrhage)    Essential hypertension    Functional ovarian cysts    GERD (gastroesophageal reflux disease)    Hemorrhoids, external, without mention of complication    Hormone replacement therapy (postmenopausal)    IBS (irritable bowel syndrome)    Migraine    Mitral valve disease    Right knee DJD    Seizures ([facility])    Sleep apnea    TIA (transient ischemic attack)    Type 2 diabetes mellitus ([place])     Past Surgical History:  Procedure Laterality Date   BIOPSY  09/23/2018   Procedure: BIOPSY;  Surgeon: Rogene Houston, MD;  [facility]: [facility];  Service: Endoscopy;;  duodeneum and stomach   CHOLECYSTECTOMY  1980   ENDARTERECTOMY Right 07/01/2017   Procedure: ENDARTERECTOMY CAROTID RIGHT;  Surgeon: Serafina Mitchell, MD;  Location: [facility] OR;  Service: Vascular;  Laterality: Right;   ESOPHAGOGASTRODUODENOSCOPY N/A 09/23/2018   Procedure: ESOPHAGOGASTRODUODENOSCOPY (EGD);  Surgeon: Rogene Houston, MD;  [facility]: [facility];  Service: Endoscopy;  Laterality: N/A;  2:45-moved to 1:45 per Lelon Frohlich   ESOPHAGOGASTRODUODENOSCOPY (EGD) WITH PROPOFOL N/A 03/07/2021   Procedure: ESOPHAGOGASTRODUODENOSCOPY (EGD) WITH PROPOFOL;  Surgeon: Rogene Houston, MD;  [facility];  Service: Endoscopy;  Laterality: N/A;  12:50   LEFT HEART CATHETERIZATION WITH CORONARY ANGIOGRAM N/A 11/18/2013   Procedure: LEFT HEART CATHETERIZATION WITH CORONARY ANGIOGRAM;  Surgeon: Burnell Blanks, MD;  Location[facility] CATH LAB;  Service: Cardiovascular;  Laterality: N/A;   OOPHORECTOMY     2012 Dr. Adah Perl in [place] Right 07/01/2017   Procedure: RIGHT CAROTID PATCH ANGIOPLASTY;  Surgeon: Serafina Mitchell, MD;  Location: [place];  Service: Vascular;  Laterality: Right;   TONSILLECTOMY     TOTAL KNEE ARTHROPLASTY Right 09/02/2012   Procedure: TOTAL KNEE ARTHROPLASTY- right;  Surgeon: Ninetta Lights, MD;  Location: [place];  Service: Orthopedics;  Laterality: Right;   TUBAL LIGATION     TYMPANOMASTOIDECTOMY Left 04/02/2016   Procedure: LEFT CANAL WALL DOWN TYMPANOMASTOIDECTOMY;  Surgeon: Leta Baptist, MD;  Location: [place];  Service: ENT;  Laterality: Left;   [place] Right 07/02/2017   Procedure: RIGHT NECK EXPLORATION WITH INTRAOPERATIVE ULTRASOUND;  Surgeon: Angelia Mould, MD;  Location: [facility] OR;  Service: Vascular;  Laterality: Right;    Current Outpatient Medications  Medication Sig Dispense Refill   acetaminophen (TYLENOL) 500 MG tablet Take 500 mg by mouth every 6 (six) hours as needed for mild pain.     amiodarone (PACERONE) 200 MG tablet 490m po bid x 1 week then 2029mpo bid x2 week, then 20074mo qday 84 tablet 0   aspirin EC 81 MG tablet Take 1 tablet (81 mg total) by mouth daily.     carvedilol (COREG) 3.125 MG tablet Take 1 tablet (3.125 mg total) by mouth 2 (two) times daily. 180 tablet 1   cholecalciferol (VITAMIN D) 1000 UNITS tablet Take 1,000 Units by mouth daily.     DULoxetine (CYMBALTA) 60 MG capsule TAKE 1 CAPSULE BY MOUTH ONCE DAILY. 30 capsule 2   empagliflozin (JARDIANCE) 10 MG TABS tablet Take 1 tablet (10 mg total) by mouth daily before breakfast. 90 tablet 3   fluticasone (FLONASE) 50 MCG/ACT nasal spray Place 2 sprays into both nostrils as needed for allergies or rhinitis.     furosemide (LASIX) 40 MG tablet Take 1 tablet (40 mg total) by mouth daily. 90 tablet 1   gabapentin (NEURONTIN) 300 MG capsule Take 300 mg by mouth 3 (three) times daily as needed (pain).     lactulose (CHRONULAC) 10 GM/15ML solution TAKE  30 MLS (20 GRAM TOTAL) BY MOUTH 3 TIMES A DAY. 2700 mL 5   lamoTRIgine (LAMICTAL) 200 MG tablet Take 1 tablet (200 mg total) by mouth 2 (two) times daily. 180 tablet 3   levocetirizine (XYZAL) 5 MG tablet TAKE 1 TABLET EVERY EVENING. 30 tablet 5   LORazepam (ATIVAN) 0.5 MG tablet Take 1 tablet (0.5 mg total) by mouth 2 (two) times daily as needed for anxiety. 30 tablet 5   naloxone (NARCAN) nasal spray 4 mg/0.1 mL Place into the nose once.     potassium chloride SA (KLOR-CON M) 20 MEQ tablet Take 2 tablets (40 mEq total) by mouth daily. 90 tablet 3   sacubitril-valsartan (ENTRESTO) 24-26 MG Take 1 tablet by mouth 2 (two) times daily. 180 tablet 3   No current facility-administered medications for this visit.   Allergies:  Patient has no known allergies.   Social History: The patient  reports that she has never smoked. She has never used smokeless tobacco.  She reports that she does not drink alcohol and does not use drugs.   Family History: The patient's family history includes Cancer in her sister; Diabetes in her mother; Heart disease in her brother, brother, brother, mother, and son.   ROS: No orthopnea or PND.  Insomnia.  Physical Exam: VS:  BP 130/82 (BP Location: Left Arm, Patient Position: Sitting, Cuff Size: Normal)   Pulse 93   Ht 5' 4"  (1.626 m)   Wt 174 lb 12.8 oz (79.3 kg)   SpO2 96%   BMI 30.00 kg/m , BMI Body mass index is 30 kg/m.  Wt Readings from Last 3 Encounters:  10/10/21 174 lb 12.8 oz (79.3 kg)  10/03/21 176 lb 6 oz (80 kg)  10/01/21 174 lb 11.2 oz (79.2 kg)    General: Patient appears comfortable at rest. HEENT: Conjunctiva and lids normal. Neck: Supple, no elevated JVP or carotid bruits, no thyromegaly. Lungs: Clear to auscultation, nonlabored breathing at rest. Cardiac: Regular rate and rhythm, no S3, 2/6 systolic murmur, no pericardial rub. Abdomen: Soft, protuberant, bowel sounds present, no guarding or rebound. Extremities: No pitting edema, distal  pulses 2+. Skin: Warm and dry. Musculoskeletal: No kyphosis. Neuropsychiatric: Alert and oriented x3, affect grossly appropriate.  ECG:  An ECG dated 09/28/2021 was personally reviewed today and demonstrated:  Sinus rhythm with left bundle branch block.  Recent Labwork: 09/12/2021: ALT 11; AST 21 09/28/2021: B Natriuretic Peptide 1,025.0 09/29/2021: Magnesium 1.9; TSH 1.066 09/30/2021: Hemoglobin 11.8; Platelets 147 10/08/2021: BUN 17; Creatinine, Ser 0.99; Potassium 4.3; Sodium 140     Component Value Date/Time   CHOL 213 (H) 09/12/2021 0927   TRIG 380 (H) 09/12/2021 0927   HDL 47 09/12/2021 0927   CHOLHDL 4.5 (H) 09/12/2021 0927   CHOLHDL 5.2 05/08/2007 0530   VLDL 38 05/08/2007 0530   LDLCALC 102 (H) 09/12/2021 0927    Other Studies Reviewed Today:  Echocardiogram 09/29/2021:  1. Left ventricular ejection fraction, by estimation, is 35 to 40%. Left  ventricular ejection fraction by 3D volume is 40 %. The left ventricle has  moderately decreased function. The left ventricle demonstrates global  hypokinesis. Left ventricular  diastolic parameters are consistent with Grade I diastolic dysfunction  (impaired relaxation). Elevated left atrial pressure.   2. Right ventricular systolic function is normal. The right ventricular  size is normal.   3. Left atrial size was moderately dilated.   4. The mitral valve is rheumatic. Moderate to severe mitral valve  regurgitation. Moderate-to-severe mitral stenosis. MVA by continuity is  1.4cm2 by continuity. The mean mitral valve gradient is 12.0 mmHg with HR  84bpm. Consider TEE for further  evaluation if clinically indicated.   5. The aortic valve was not well visualized. There is mild calcification  of the aortic valve. There is mild thickening of the aortic valve. Aortic  valve regurgitation is mild.   6. The inferior vena cava is normal in size with greater than 50%  respiratory variability, suggesting right atrial pressure of 3 mmHg.    Assessment and Plan:  1.  PSVT presenting as wide-complex tachycardia with baseline left bundle branch block, recent hospitalization reviewed.  She was placed on amiodarone for rhythm suppression given recurring arrhythmia after initial treatment with adenosine.  Ideally, would avoid long-term use of amiodarone particularly with history of NASH.  She has a visit to see Dr. Lovena Le for EP consultation.  I am hopeful that he will consider both her candidacy for ablation versus transition to perhaps a better  long-term antiarrhythmic.  2.  HFrEF, LVEF 35 to 40% range by recent echocardiogram, had previously normalized at 55 to 60% by echocardiogram in December 2022.  Unclear whether this is related to progressive rheumatic mitral valve disease or perhaps tachycardia induced with her recent presentation.  Either way, plan will be to optimize GDMT.  Add Coreg 3.125 mg twice daily.  Continue Entresto, Lasix with potassium supplement, and Jardiance.  Last addition would be MRA, check BMET for next visit.  3.  Rheumatic mitral valve disease, now with moderate to severe mitral stenosis and mitral regurgitation.  We discussed the progressive nature of her valvular condition.  Ultimately would get consultation with TCTS to discuss candidacy and risk for mitral valve replacement.  At this point her risk would be fairly high in light of cardiomyopathy, so we will try to optimize medications first and then reassess LVEF and valvular status most likely with TEE.  Medication Adjustments/Labs and Tests Ordered: Current medicines are reviewed at length with the patient today.  Concerns regarding medicines are outlined above.   Tests Ordered: Orders Placed This Encounter  Procedures   Basic metabolic panel   EKG 69-GSPJ    Medication Changes: Meds ordered this encounter  Medications   carvedilol (COREG) 3.125 MG tablet    Sig: Take 1 tablet (3.125 mg total) by mouth 2 (two) times daily.    Dispense:  180  tablet    Refill:  1    10/10/2021 NEW    Disposition:  Follow up  6 weeks.  Signed, Satira Sark, MD, [facility] 10/10/2021 9:47 AM    [facility] [place], [place], [place] [address] Phone: [phone]; Fax: [phone]

## 2021-10-10 ENCOUNTER — Encounter: Payer: Self-pay | Admitting: Cardiology

## 2021-10-10 ENCOUNTER — Ambulatory Visit: Payer: Medicare HMO | Admitting: Cardiology

## 2021-10-10 VITALS — BP 130/82 | HR 93 | Ht 64.0 in | Wt 174.8 lb

## 2021-10-10 DIAGNOSIS — I050 Rheumatic mitral stenosis: Secondary | ICD-10-CM | POA: Diagnosis not present

## 2021-10-10 DIAGNOSIS — Z79899 Other long term (current) drug therapy: Secondary | ICD-10-CM

## 2021-10-10 DIAGNOSIS — I5020 Unspecified systolic (congestive) heart failure: Secondary | ICD-10-CM

## 2021-10-10 DIAGNOSIS — I471 Supraventricular tachycardia: Secondary | ICD-10-CM | POA: Diagnosis not present

## 2021-10-10 MED ORDER — CARVEDILOL 3.125 MG PO TABS: 3.1250 mg | ORAL_TABLET | Freq: Two times a day (BID) | ORAL | 1 refills | Status: DC

## 2021-10-10 NOTE — Telephone Encounter (Signed)
Has she tried melatonin?

## 2021-10-10 NOTE — Patient Instructions (Addendum)
Medication Instructions:  Your physician has recommended you make the following change in your medication:  Start carvedilol 3.125 mg twice daily Continue other medications the same  Labwork: BMET in 6 weeks just before your next visit Non-fasting [facility] Medicine Lab  Testing/Procedures: none  Follow-Up: Your physician recommends that you schedule a follow-up appointment in: 6 weeks  Any Other Special Instructions Will Be Listed Below (If Applicable).  If you need a refill on your cardiac medications before your next appointment, please call your pharmacy.

## 2021-10-10 NOTE — Telephone Encounter (Signed)
Pt states she takes melatonin now and wanted to know if she could get an Rx for what they gave her in the hospital Carol Richmond) and I advised pt that a lot of medications for sleep also have a side effect of increased risk for cardiovascular events and arrhythmias which is what she was just hospitalized for and pt states she has visit with Tiffany in the morning so I advised pt to discuss further during appt and pt voiced understanding.

## 2021-10-11 ENCOUNTER — Encounter: Payer: Self-pay | Admitting: Family Medicine

## 2021-10-11 ENCOUNTER — Ambulatory Visit (INDEPENDENT_AMBULATORY_CARE_PROVIDER_SITE_OTHER): Payer: Medicare HMO | Admitting: Family Medicine

## 2021-10-11 DIAGNOSIS — K5792 Diverticulitis of intestine, part unspecified, without perforation or abscess without bleeding: Secondary | ICD-10-CM | POA: Diagnosis not present

## 2021-10-11 DIAGNOSIS — R110 Nausea: Secondary | ICD-10-CM

## 2021-10-11 MED ORDER — ONDANSETRON HCL 4 MG PO TABS: 4.0000 mg | ORAL_TABLET | Freq: Three times a day (TID) | ORAL | 0 refills | Status: DC | PRN

## 2021-10-11 MED ORDER — METRONIDAZOLE 500 MG PO TABS: 500.0000 mg | ORAL_TABLET | Freq: Two times a day (BID) | ORAL | 0 refills | Status: AC

## 2021-10-11 MED ORDER — CIPROFLOXACIN HCL 500 MG PO TABS: 500.0000 mg | ORAL_TABLET | Freq: Two times a day (BID) | ORAL | 0 refills | Status: AC

## 2021-10-11 NOTE — Progress Notes (Signed)
Virtual Visit  Note Due to COVID-19 pandemic this visit was conducted virtually. This visit type was conducted due to national recommendations for restrictions regarding the COVID-19 Pandemic (e.g. social distancing, sheltering in place) in an effort to limit this patient's exposure and mitigate transmission in our community. All issues noted in this document were discussed and addressed.  A physical exam was not performed with this format.  I connected with Carol Richmond on 10/11/21 at [phone] by telephone and verified that I am speaking with the correct person using two identifiers. Carol Richmond is currently located at home and no one is currently with her during the visit. The provider, Gwenlyn Perking, FNP is located in their office at time of visit.  I discussed the limitations, risks, security and privacy concerns of performing an evaluation and management service by telephone and the availability of in person appointments. I also discussed with the patient that there may be a patient responsible charge related to this service. The patient expressed understanding and agreed to proceed.   History and Present Illness:  Diarrhea  This is a new problem. The current episode started yesterday. The problem occurs 5 to 10 times per day. The problem has been waxing and waning. The stool consistency is described as Watery. The patient states that diarrhea awakens her from sleep. Associated symptoms include abdominal pain (LLQ) and bloating. Pertinent negatives include no arthralgias, chills, coughing, fever, headaches, increased  flatus, myalgias, sweats, URI, vomiting or weight loss. Associated symptoms comments: nausea. Risk factors include recent hospitalization. She has tried anti-motility drug for the symptoms. The treatment provided mild relief. diverticulosis      Review of Systems  Constitutional:  Negative for chills, fever and weight loss.  Respiratory:  Negative for cough.    Gastrointestinal:  Positive for abdominal pain (LLQ), bloating and diarrhea. Negative for flatus and vomiting.  Musculoskeletal:  Negative for arthralgias and myalgias.  Neurological:  Negative for headaches.     Observations/Objective: Alert and oriented x 3. Able to speak in full sentences without difficulty.    Assessment and Plan: Carol Richmond was seen today for diarrhea.  Diagnoses and all orders for this visit:  Diverticulitis Cipro and flagyl as below. Liquid diet, advance as tolerated. Stay well hydrated.  -     ciprofloxacin (CIPRO) 500 MG tablet; Take 1 tablet (500 mg total) by mouth 2 (two) times daily for 7 days. -     metroNIDAZOLE (FLAGYL) 500 MG tablet; Take 1 tablet (500 mg total) by mouth 2 (two) times daily for 7 days.  Nausea Zofran prn.  -     ondansetron (ZOFRAN) 4 MG tablet; Take 1 tablet (4 mg total) by mouth every 8 (eight) hours as needed for nausea or vomiting.   Follow Up Instructions: Return to office for new or worsening symptoms, or if symptoms persist.     I discussed the assessment and treatment plan with the patient. The patient was provided an opportunity to ask questions and all were answered. The patient agreed with the plan and demonstrated an understanding of the instructions.   The patient was advised to call back or seek an in-person evaluation if the symptoms worsen or if the condition fails to improve as anticipated.  The above assessment and management plan was discussed with the patient. The patient verbalized understanding of and has agreed to the management plan. Patient is aware to call the clinic if symptoms persist or worsen. Patient is aware when to return  to the clinic for a follow-up visit. Patient educated on when it is appropriate to go to the emergency department.   Time call ended:  0848  I provided 12 minutes of  non face-to-face time during this encounter.    Gwenlyn Perking, FNP

## 2021-10-12 ENCOUNTER — Telehealth: Payer: Self-pay | Admitting: Family Medicine

## 2021-10-12 NOTE — Telephone Encounter (Signed)
Patient calling about medications that were called in (metroNIDAZOLE (FLAGYL) 500 MG tablet and ciprofloxacin (CIPRO) 500 MG tablet) she wants clarification on how to take and wants to know if anything else is being called in for her diarrhea. Please call back.

## 2021-10-12 NOTE — Telephone Encounter (Signed)
Spoke with patient on how to take medication patient verbalized understanding.

## 2021-10-18 ENCOUNTER — Ambulatory Visit (HOSPITAL_COMMUNITY)
Admission: RE | Admit: 2021-10-18 | Discharge: 2021-10-18 | Disposition: A | Discharge status: Home / Self Care | Payer: Medicare HMO | Source: Ambulatory Visit | Attending: Gastroenterology | Admitting: Gastroenterology

## 2021-10-18 DIAGNOSIS — Z9049 Acquired absence of other specified parts of digestive tract: Secondary | ICD-10-CM | POA: Diagnosis not present

## 2021-10-18 DIAGNOSIS — K7460 Unspecified cirrhosis of liver: Secondary | ICD-10-CM | POA: Diagnosis not present

## 2021-10-19 LAB — COMPREHENSIVE METABOLIC PANEL
AG Ratio: 1.7 (calc) (ref 1.0–2.5)
ALT: 19 U/L (ref 6–29)
AST: 32 U/L (ref 10–35)
Albumin: 4.5 g/dL (ref 3.6–5.1)
Alkaline phosphatase (APISO): 86 U/L (ref 37–153)
BUN: 12 mg/dL (ref 7–25)
CO2: 24 mmol/L (ref 20–32)
Calcium: 8.8 mg/dL (ref 8.6–10.4)
Chloride: 104 mmol/L (ref 98–110)
Creat: 0.98 mg/dL (ref 0.50–1.05)
Globulin: 2.7 g/dL (calc) (ref 1.9–3.7)
Glucose, Bld: 252 mg/dL — ABNORMAL HIGH (ref 65–139)
Potassium: 4.3 mmol/L (ref 3.5–5.3)
Sodium: 140 mmol/L (ref 135–146)
Total Bilirubin: 0.4 mg/dL (ref 0.2–1.2)
Total Protein: 7.2 g/dL (ref 6.1–8.1)

## 2021-10-19 LAB — CBC WITH DIFFERENTIAL/PLATELET
Absolute Monocytes: 1073 cells/uL — ABNORMAL HIGH (ref 200–950)
Basophils Absolute: 44 cells/uL (ref 0–200)
Basophils Relative: 0.6 %
Eosinophils Absolute: 102 cells/uL (ref 15–500)
Eosinophils Relative: 1.4 %
HCT: 39 % (ref 35.0–45.0)
Hemoglobin: 12.9 g/dL (ref 11.7–15.5)
Lymphs Abs: 1628 cells/uL (ref 850–3900)
MCH: 29.2 pg (ref 27.0–33.0)
MCHC: 33.1 g/dL (ref 32.0–36.0)
MCV: 88.2 fL (ref 80.0–100.0)
MPV: 10.3 fL (ref 7.5–12.5)
Monocytes Relative: 14.7 %
Neutro Abs: 4453 cells/uL (ref 1500–7800)
Neutrophils Relative %: 61 %
Platelets: 233 10*3/uL (ref 140–400)
RBC: 4.42 10*6/uL (ref 3.80–5.10)
RDW: 13.7 % (ref 11.0–15.0)
Total Lymphocyte: 22.3 %
WBC: 7.3 10*3/uL (ref 3.8–10.8)

## 2021-10-19 LAB — PROTIME-INR
INR: 1.1
Prothrombin Time: 11.3 s (ref 9.0–11.5)

## 2021-10-19 LAB — AFP TUMOR MARKER: AFP-Tumor Marker: 4.4 ng/mL

## 2021-10-23 ENCOUNTER — Ambulatory Visit: Payer: Medicare HMO

## 2021-10-23 ENCOUNTER — Encounter: Payer: Self-pay | Admitting: Nurse Practitioner

## 2021-10-23 ENCOUNTER — Ambulatory Visit (INDEPENDENT_AMBULATORY_CARE_PROVIDER_SITE_OTHER): Payer: Medicare HMO | Admitting: Nurse Practitioner

## 2021-10-23 VITALS — BP 110/66 | HR 79 | Temp 97.4°F | Resp 20 | Ht 64.0 in | Wt 175.0 lb

## 2021-10-23 DIAGNOSIS — S91311A Laceration without foreign body, right foot, initial encounter: Secondary | ICD-10-CM | POA: Diagnosis not present

## 2021-10-23 DIAGNOSIS — M546 Pain in thoracic spine: Secondary | ICD-10-CM

## 2021-10-23 MED ORDER — MELOXICAM 15 MG PO TABS: 15.0000 mg | ORAL_TABLET | Freq: Every day | ORAL | 0 refills | Status: DC

## 2021-10-23 MED ORDER — DICLOFENAC SODIUM 1 % EX GEL: 4.0000 g | Freq: Four times a day (QID) | CUTANEOUS | 1 refills | Status: DC

## 2021-10-23 NOTE — Progress Notes (Signed)
Subjective:    Patient ID: [name], female    DOB: 27-Oct-1950, 71 y.o.   MRN: [id number]   Chief Complaint: Cut to the back of right foot (Unsure of cause. Happened Sunday) and Left side upper back pain   HPI Patient come sin with 2 complaints: - laceration to the back of right heel. Happened Sunday.  - back pain- left upper back pain. Pain is intermittent. Movement increases pain, nothing helps. She has taken tylenol with no relief. Rates pain 8/10 currently. Started 3 days ago.   Review of Systems  Constitutional:  Negative for diaphoresis.  Eyes:  Negative for pain.  Respiratory:  Negative for shortness of breath.   Cardiovascular:  Negative for chest pain, palpitations and leg swelling.  Gastrointestinal:  Negative for abdominal pain.  Endocrine: Negative for polydipsia.  Skin:  Negative for rash.  Neurological:  Negative for dizziness, weakness and headaches.  Hematological:  Does not bruise/bleed easily.  All other systems reviewed and are negative.      Objective:   Physical Exam Constitutional:      Appearance: Normal appearance.  Cardiovascular:     Rate and Rhythm: Normal rate and regular rhythm.     Heart sounds: Normal heart sounds.  Pulmonary:     Breath sounds: Normal breath sounds.  Musculoskeletal:     Comments: No upper back point tenderness 'no visible rash   Skin:    General: Skin is warm.     Comments: 5cm N shaped lacertion to the back of right heel  Neurological:     General: No focal deficit present.     Mental Status: She is alert and oriented to person, place, and time.  Psychiatric:        Mood and Affect: Mood normal.        Behavior: Behavior normal.    BP 110/66   Pulse 79   Temp (!) 97.4 F (36.3 C) (Temporal)   Resp 20   Ht 5' 4"  (1.626 m)   Wt 175 lb (79.4 kg)   SpO2 99%   BMI 30.04 kg/m         Assessment & Plan:   [name] in today with chief complaint of Cut to the back of right foot  (Unsure of cause. Happened Sunday) and Left side upper back pain   1. Acute left-sided thoracic back pain Moist heat Meds ordered this encounter  Medications   meloxicam (MOBIC) 15 MG tablet    Sig: Take 1 tablet (15 mg total) by mouth daily.    Dispense:  30 tablet    Refill:  0    Order Specific Question:   Supervising Provider    Answer:   Caryl Pina A [[id number]]   diclofenac Sodium (VOLTAREN) 1 % GEL    Sig: Apply 4 g topically 4 (four) times daily.    Dispense:  350 g    Refill:  1    Order Specific Question:   Supervising Provider    Answer:   Caryl Pina A [[id number]]    2. Laceration of right heel, initial encounter Will have to heal by secondary  intentions Keep clean Antibiotic ointment Keep covered    The above assessment and management plan was discussed with the patient. The patient verbalized understanding of and has agreed to the management plan. Patient is aware to call the clinic if symptoms persist or worsen. Patient is aware when to return to the clinic for a follow-up visit.  Patient educated on when it is appropriate to go to the emergency department.   Mary-Margaret Hassell Done, FNP

## 2021-10-23 NOTE — Patient Instructions (Signed)
Laceration Care, Adult A laceration is a cut that may go through all layers of the skin. The cut may also go into the tissue that is right under the skin. Some cuts heal on their own. Other cuts need to be closed with stitches (sutures), staples, skin adhesive strips, or skin glue. Taking care of your cut lowers your risk of infection, helps your injury heal better, and may prevent scarring. General tips Keep your wound clean and dry. Do not scratch or pick at your wound. Wash your hands with soap and water for at least 20 seconds before and after touching your wound or changing your bandage (dressing). If you cannot use soap and water, use hand sanitizer. Do not usedisinfectants or antiseptics, such as rubbing alcohol, to clean your wound unless told by your doctor. If you were given a bandage, change it at least once a day, or as told by your doctor. You should also change it if it gets wet or dirty. How to take care of your cut If your doctor used stitches or staples: Keep the wound fully dry for the first 24 hours, or as told by your doctor. After that, you may take a shower or a bath. Do not soak the wound in water until after the stitches or staples have been taken out. Clean the wound once a day, or as told by your doctor. To do this: Wash the wound with soap and water. Rinse the wound with water to remove all soap. Pat the wound dry with a clean towel. Do not rub the wound. After you clean the wound, put a thin layer of antibiotic ointment, another ointment, or a nonstick bandage on it as told by your doctor. This will help to: Prevent infection. Keep the bandage from sticking to the wound. Have your stitches or staples taken out as told by your doctor. If your doctor used skin adhesive strips: Do not get the skin adhesive strips wet. You can take a shower or a bath, but keep the wound dry. If the wound gets wet, pat it dry with a clean towel. Do not rub the wound. Skin adhesive strips  fall off on their own. You can trim the strips as the wound heals. Do not take off any strips that are still stuck to the wound unless told by your doctor. The strips will fall off after a while. If your doctor used skin glue: You may take a shower or a bath, but try to keep the wound dry. Do not soak the wound in water. After you take a shower or a bath, pat the wound dry with a clean towel. Do not rub the wound. Do not do any activities that will make you sweat a lot until the skin glue has fallen off. Do not apply liquid, cream, or ointment medicine to your wound while the skin glue is still on. If a bandage is placed over the wound, do not put tape right on top of the skin glue. Do not pick at the glue. The skin glue usually stays on for 5-10 days. Then, it falls off the skin. Follow these instructions at home: Medicines Take over-the-counter and prescription medicines only as told by your doctor. If you were prescribed an antibiotic medicine, take or apply it as told by your doctor. Do not stop using it even if you start to feel better. Managing pain and swelling If told, put ice on the injured area. To do this: Put ice in a  plastic bag. Place a towel between your skin and the bag. Leave the ice on for 20 minutes, 2-3 times a day. Take off the ice if your skin turns bright red. This is very important. If you cannot feel pain, heat, or cold, you have a greater risk of damage to the area. Raise the injured area above the level of your heart while you are sitting or lying down. General instructions  Avoid any activity that could make your wound reopen. Check your wound every day for signs of infection. Check for: More redness, swelling, or pain. Fluid or blood. Warmth. Pus or a bad smell. Keep all follow-up visits. Contact a doctor if: You got a tetanus shot and you have any of these problems where the needle went in: Swelling. Very bad pain. Redness. Bleeding. A wound that was  closed breaks open. You have a fever. You have any of these signs of infection in your wound: More redness, swelling, or pain. Fluid or blood. Warmth. Pus or a bad smell. You see something coming out of the wound, such as wood or glass. Medicine does not make your pain go away. You notice a change in the color of your skin near your wound. You need to change the bandage often. You have a new rash. You lose feeling (have numbness) around the wound. Get help right away if: You have very bad swelling around the wound. Your pain suddenly gets worse and is very bad. You have painful lumps near the wound or on skin anywhere on your body. You have a red streak going away from your wound. The wound is on your hand or foot, and: You cannot move a finger or toe. Your fingers or toes look pale or bluish. Summary A laceration is a cut that may go through all layers of the skin. The cut may also go into the tissue right under the skin. Some cuts heal on their own. Others need to be closed with stitches, staples, skin adhesive strips, or skin glue. Follow your doctor's instructions for caring for your cut. Proper care of a cut lowers the risk of infection, helps the cut heal better, and may prevent scarring. This information is not intended to replace advice given to you by your health care provider. Make sure you discuss any questions you have with your health care provider. Document Revised: 04/13/2020 Document Reviewed: 04/13/2020 Elsevier Patient Education  [place].

## 2021-10-24 ENCOUNTER — Telehealth: Payer: Self-pay | Admitting: Cardiology

## 2021-10-24 ENCOUNTER — Ambulatory Visit: Payer: Medicare HMO

## 2021-10-24 MED ORDER — AMIODARONE HCL 200 MG PO TABS: 200.0000 mg | ORAL_TABLET | Freq: Every day | ORAL | 6 refills | Status: DC

## 2021-10-24 NOTE — Telephone Encounter (Signed)
*  STAT* If patient is at the pharmacy, call can be transferred to refill team.   1. Which medications need to be refilled? (please list name of each medication and dose if known) amiodarone (PACERONE) 200 MG tablet  2. Which pharmacy/location (including street and city if local pharmacy) is medication to be sent to? [place], [place]  3. Do they need a 30 day or 90 day supply? 90  Pt will be out of this medication  08/11 and is requesting a refill from this medicine she received in hosp.

## 2021-10-26 ENCOUNTER — Ambulatory Visit: Payer: Medicare HMO | Admitting: Pharmacist

## 2021-10-26 DIAGNOSIS — E1165 Type 2 diabetes mellitus with hyperglycemia: Secondary | ICD-10-CM

## 2021-10-26 DIAGNOSIS — E782 Mixed hyperlipidemia: Secondary | ICD-10-CM

## 2021-10-26 NOTE — Progress Notes (Signed)
    10/26/2021 Name: Carol Richmond MRN: [id number] DOB: 30-Aug-1950   S:  26 yoF Presents for type 2 diabetes evaluation, education, and management Patient was referred and last seen by Primary Care Provider on 10/08/21.  Patient reports financial difficulty with brand name medications.  She is seeking patient assistance for Jardiance.  Insurance coverage/medication affordability: [employer] medicare  Patient reports adherence with medications. Current diabetes medications include: jardiance Current hypertension medications include: entresto, coreg Goal 130/80 Current hyperlipidemia medications include: n/a    Patient denies hypoglycemic events.   Patient reported dietary habits: Eats 3 meals/day Discussed meal planning options and Plate method for healthy eating Avoid sugary drinks and desserts Incorporate balanced protein, non starchy veggies, 1 serving of carbohydrate with each meal Increase water intake Increase physical activity as able  Patient-reported exercise habits: n/a  O:  Lab Results  Component Value Date   HGBA1C 6.8 (H) 09/29/2021   Lipid Panel     Component Value Date/Time   CHOL 213 (H) 09/12/2021 0927   TRIG 380 (H) 09/12/2021 0927   HDL 47 09/12/2021 0927   CHOLHDL 4.5 (H) 09/12/2021 0927   CHOLHDL 5.2 05/08/2007 0530   VLDL 38 05/08/2007 0530   LDLCALC 102 (H) 09/12/2021 0927     Home fasting blood sugars: <140  2 hour post-meal/random blood sugars: n/a.    Clinical Atherosclerotic Cardiovascular Disease (ASCVD): No   The 10-year ASCVD risk score (Arnett DK, et al., 2019) is: 17.8%   Values used to calculate the score:     Age: 71 years     Sex: Female     Is Non-Hispanic African American: No     Diabetic: Yes     Tobacco smoker: No     Systolic Blood Pressure: 003 mmHg     Is BP treated: Yes     HDL Cholesterol: 47 mg/dL     Total Cholesterol: 213 mg/dL    A/P:  Diabetes T2DM with A1c of 7.1%, GFR 72 Patient assistance completed  for Jardiance--patient to receive healthwell grant (approved). Instructed patient to take healthwell card to pharmacy to pick up Jardiance for no cost.  She will need to reapply annually. Will plan to increase Jardiance 1m to 236mif additional glycemic control needed  -will address lipids/statin at f/u   -Extensively discussed pathophysiology of diabetes, recommended lifestyle interventions, dietary effects on blood sugar control  -Counseled on s/sx of and management of hypoglycemia     Written patient instructions provided.  Total time in face to face counseling 45 minutes.    JuRegina EckPharmD, BCPS Clinical Pharmacist, [facility] Phone [phone]

## 2021-11-01 ENCOUNTER — Encounter (INDEPENDENT_AMBULATORY_CARE_PROVIDER_SITE_OTHER): Payer: Medicare HMO | Admitting: Ophthalmology

## 2021-11-01 ENCOUNTER — Ambulatory Visit: Payer: Medicare HMO | Admitting: Family

## 2021-11-02 ENCOUNTER — Encounter: Payer: Self-pay | Admitting: Family Medicine

## 2021-11-02 ENCOUNTER — Ambulatory Visit (INDEPENDENT_AMBULATORY_CARE_PROVIDER_SITE_OTHER): Payer: Medicare HMO | Admitting: Family Medicine

## 2021-11-02 VITALS — BP 117/74 | HR 75 | Temp 98.2°F | Ht 64.0 in | Wt 176.0 lb

## 2021-11-02 DIAGNOSIS — H6122 Impacted cerumen, left ear: Secondary | ICD-10-CM

## 2021-11-02 DIAGNOSIS — R051 Acute cough: Secondary | ICD-10-CM | POA: Diagnosis not present

## 2021-11-02 DIAGNOSIS — J040 Acute laryngitis: Secondary | ICD-10-CM | POA: Diagnosis not present

## 2021-11-02 NOTE — Progress Notes (Signed)
Subjective:  [name], female    DOB: 13-Oct-1950, 71 y.o.   MRN: [id number]  Patient Care Team: Gwenlyn Perking, FNP as PCP - General (Family Medicine) Satira Sark, MD as PCP - Cardiology (Cardiology) Cameron Sprang, MD as Consulting Physician (Neurology) Rogene Houston, MD as Consulting Physician (Gastroenterology) Leticia Clas, [place] (Optometry) Angelia Mould, MD as Consulting Physician (Vascular Surgery) Lavera Guise, [facility] as [facility] (Pharmacist)   Chief Complaint:  cold like symptoms (Cough/Phlegm/Loss of voice)   HPI: Carol Richmond is a 71 y.o. female presenting on 11/02/2021 for cold like symptoms (Cough/Phlegm/Loss of voice)   Pt presents today with complaints of raspy voice. States on Tuesday she had a slight cough which has since resolved. She now has a raspy voice. No other associated symptoms. Home COVID test yesterday was negative.     Relevant past medical, surgical, family, and social history reviewed and updated as indicated.  Allergies and medications reviewed and updated. Data reviewed: Chart in Epic.   Past Medical History:  Diagnosis Date   Asthma    Carotid artery disease ([place])    Diverticulosis of colon (without mention of hemorrhage)    Essential hypertension    Functional ovarian cysts    GERD (gastroesophageal reflux disease)    Hemorrhoids, external, without mention of complication    Hormone replacement therapy (postmenopausal)    IBS (irritable bowel syndrome)    Migraine    Mitral valve disease    Right knee DJD    Seizures ([facility])    Sleep apnea    TIA (transient ischemic attack)    Type 2 diabetes mellitus ([place])     Past Surgical History:  Procedure Laterality Date   BIOPSY  09/23/2018   Procedure: BIOPSY;  Surgeon: Rogene Houston, MD;  [facility];  Service: Endoscopy;;  duodeneum and stomach   CHOLECYSTECTOMY  1980   ENDARTERECTOMY  Right 07/01/2017   Procedure: ENDARTERECTOMY CAROTID RIGHT;  Surgeon: Serafina Mitchell, MD;  Location: [facility] OR;  Service: Vascular;  Laterality: Right;   ESOPHAGOGASTRODUODENOSCOPY N/A 09/23/2018   Procedure: ESOPHAGOGASTRODUODENOSCOPY (EGD);  Surgeon: Rogene Houston, MD;  [facility];  Service: Endoscopy;  Laterality: N/A;  2:45-moved to 1:45 per Lelon Frohlich   ESOPHAGOGASTRODUODENOSCOPY (EGD) WITH PROPOFOL N/A 03/07/2021   Procedure: ESOPHAGOGASTRODUODENOSCOPY (EGD) WITH PROPOFOL;  Surgeon: Rogene Houston, MD;  [facility];  Service: Endoscopy;  Laterality: N/A;  12:50   LEFT HEART CATHETERIZATION WITH CORONARY ANGIOGRAM N/A 11/18/2013   Procedure: LEFT HEART CATHETERIZATION WITH CORONARY ANGIOGRAM;  Surgeon: Burnell Blanks, MD;  Location: [facility] LAB;  Service: Cardiovascular;  Laterality: N/A;   OOPHORECTOMY     2012 Dr. Adah Perl in [place] Right 07/01/2017   Procedure: RIGHT CAROTID PATCH ANGIOPLASTY;  Surgeon: Serafina Mitchell, MD;  Location: [place];  Service: Vascular;  Laterality: Right;   TONSILLECTOMY     TOTAL KNEE ARTHROPLASTY Right 09/02/2012   Procedure: TOTAL KNEE ARTHROPLASTY- right;  Surgeon: Ninetta Lights, MD;  Location: [place];  Service: Orthopedics;  Laterality: Right;   TUBAL LIGATION     TYMPANOMASTOIDECTOMY Left 04/02/2016   Procedure: LEFT CANAL WALL DOWN TYMPANOMASTOIDECTOMY;  Surgeon: Leta Baptist, MD;  Location: [place];  Service: ENT;  Laterality: Left;   [place] Right 07/02/2017   Procedure: RIGHT NECK EXPLORATION WITH INTRAOPERATIVE  ULTRASOUND;  Surgeon: Angelia Mould, MD;  Location[facility];  Service: Vascular;  Laterality: Right;    Social History   Socioeconomic History   Marital status: Widowed    Spouse name: Not on file   Number of children: 3   Years of education: Not on file   Highest education level: Not on file  Occupational History   Occupation: retired   Tobacco Use   Smoking status: Never   Smokeless tobacco: Never  Vaping Use   Vaping Use: Never used  Substance and Sexual Activity   Alcohol use: No    Alcohol/week: 0.0 standard drinks of alcohol   Drug use: No   Sexual activity: Not Currently  Other Topics Concern   Not on file  Social History Narrative   Lives alone. Granddaughter lives across the street, her 3 children all live nearby   Social Determinants of Health   Financial Resource Strain: Low Risk  (10/02/2021)   Overall Financial Resource Strain (CARDIA)    Difficulty of Paying Living Expenses: Not very hard  Recent Concern: Financial Resource Strain - Medium Risk (07/11/2021)   Overall Financial Resource Strain (CARDIA)    Difficulty of Paying Living Expenses: Somewhat hard  Food Insecurity: Food Insecurity Present (07/05/2021)   Hunger Vital Sign    Worried About Running Out of Food in the Last Year: Sometimes true    Ran Out of Food in the Last Year: Never true  Transportation Needs: No Transportation Needs (10/02/2021)   PRAPARE - Hydrologist (Medical): No    Lack of Transportation (Non-Medical): No  Physical Activity: Insufficiently Active (07/05/2021)   Exercise Vital Sign    Days of Exercise per Week: 7 days    Minutes of Exercise per Session: 20 min  Stress: No Stress Concern Present (07/05/2021)   [place]    Feeling of Stress : Not at all  Social Connections: Moderately Integrated (07/05/2021)   Social Connection and Isolation Panel [NHANES]    Frequency of Communication with Friends and Family: More than three times a week    Frequency of Social Gatherings with Friends and Family: More than three times a week    Attends Religious Services: More than 4 times per year    Active Member of [employer] or Organizations: Yes    Attends Archivist Meetings: More than 4 times per year    Marital Status: Widowed   Intimate Partner Violence: Not At Risk (07/05/2021)   Humiliation, Afraid, Rape, and Kick questionnaire    Fear of Current or Ex-Partner: No    Emotionally Abused: No    Physically Abused: No    Sexually Abused: No    Outpatient Encounter Medications as of 11/02/2021  Medication Sig   acetaminophen (TYLENOL) 500 MG tablet Take 500 mg by mouth every 6 (six) hours as needed for mild pain.   amiodarone (PACERONE) 200 MG tablet Take 1 tablet (200 mg total) by mouth daily.   aspirin EC 81 MG tablet Take 1 tablet (81 mg total) by mouth daily.   carvedilol (COREG) 3.125 MG tablet Take 1 tablet (3.125 mg total) by mouth 2 (two) times daily.   cholecalciferol (VITAMIN D) 1000 UNITS tablet Take 1,000 Units by mouth daily.   diclofenac Sodium (VOLTAREN) 1 % GEL Apply 4 g topically 4 (four) times daily.   DULoxetine (CYMBALTA) 60 MG capsule TAKE 1 CAPSULE BY MOUTH ONCE DAILY.   empagliflozin (  JARDIANCE) 10 MG TABS tablet Take 1 tablet (10 mg total) by mouth daily before breakfast.   fluticasone (FLONASE) 50 MCG/ACT nasal spray Place 2 sprays into both nostrils as needed for allergies or rhinitis.   furosemide (LASIX) 40 MG tablet Take 1 tablet (40 mg total) by mouth daily.   gabapentin (NEURONTIN) 300 MG capsule Take 300 mg by mouth 3 (three) times daily as needed (pain).   lactulose (CHRONULAC) 10 GM/15ML solution TAKE 30 MLS (20 GRAM TOTAL) BY MOUTH 3 TIMES A DAY.   lamoTRIgine (LAMICTAL) 200 MG tablet Take 1 tablet (200 mg total) by mouth 2 (two) times daily.   levocetirizine (XYZAL) 5 MG tablet TAKE 1 TABLET EVERY EVENING.   LORazepam (ATIVAN) 0.5 MG tablet Take 1 tablet (0.5 mg total) by mouth 2 (two) times daily as needed for anxiety.   meloxicam (MOBIC) 15 MG tablet Take 1 tablet (15 mg total) by mouth daily.   naloxone (NARCAN) nasal spray 4 mg/0.1 mL Place into the nose once.   ondansetron (ZOFRAN) 4 MG tablet Take 1 tablet (4 mg total) by mouth every 8 (eight) hours as needed for nausea or  vomiting.   pantoprazole (PROTONIX) 40 MG tablet Take 40 mg by mouth 2 (two) times daily.   potassium chloride SA (KLOR-CON M) 20 MEQ tablet Take 2 tablets (40 mEq total) by mouth daily.   sacubitril-valsartan (ENTRESTO) 24-26 MG Take 1 tablet by mouth 2 (two) times daily.   No facility-administered encounter medications on file as of 11/02/2021.    No Known Allergies  Review of Systems  Constitutional:  Negative for activity change, appetite change, chills, diaphoresis, fatigue, fever and unexpected weight change.  HENT:  Positive for hearing loss and voice change. Negative for congestion, dental problem, drooling, ear discharge, ear pain, facial swelling, mouth sores, nosebleeds, postnasal drip, rhinorrhea, sinus pressure, sinus pain, sneezing, sore throat, tinnitus and trouble swallowing.   Eyes: Negative.   Respiratory:  Positive for cough. Negative for shortness of breath.   Cardiovascular:  Negative for chest pain, palpitations and leg swelling.  Gastrointestinal: Negative.   Endocrine: Negative.   Genitourinary:  Negative for decreased urine volume and difficulty urinating.  Musculoskeletal: Negative.   Skin: Negative.   Allergic/Immunologic: Negative.   Neurological: Negative.   Hematological: Negative.   Psychiatric/Behavioral: Negative.    All other systems reviewed and are negative.       Objective:  BP 117/74   Pulse 75   Temp 98.2 F (36.8 C)   Ht 5' 4"  (1.626 m)   Wt 176 lb (79.8 kg)   SpO2 97%   BMI 30.21 kg/m    Wt Readings from Last 3 Encounters:  11/02/21 176 lb (79.8 kg)  10/23/21 175 lb (79.4 kg)  10/10/21 174 lb 12.8 oz (79.3 kg)    Physical Exam Vitals and nursing note reviewed.  Constitutional:      General: She is not in acute distress.    Appearance: Normal appearance. She is obese. She is not ill-appearing, toxic-appearing or diaphoretic.  HENT:     Head: Normocephalic and atraumatic.     Right Ear: Hearing, tympanic membrane, ear canal  and external ear normal.     Left Ear: There is impacted cerumen.     Nose: Nose normal.     Mouth/Throat:     Lips: Pink.     Mouth: Mucous membranes are moist.     Pharynx: Oropharynx is clear. Uvula midline. No pharyngeal swelling, oropharyngeal exudate, posterior oropharyngeal  erythema or uvula swelling.     Comments: Raspy voice Cardiovascular:     Rate and Rhythm: Normal rate and regular rhythm.     Heart sounds: Normal heart sounds.  Pulmonary:     Effort: Pulmonary effort is normal.     Breath sounds: Normal breath sounds.  Musculoskeletal:     Right lower leg: No edema.     Left lower leg: No edema.  Skin:    General: Skin is warm and dry.     Capillary Refill: Capillary refill takes less than 2 seconds.  Neurological:     General: No focal deficit present.     Mental Status: She is alert and oriented to person, place, and time.  Psychiatric:        Mood and Affect: Mood normal.        Behavior: Behavior normal.        Thought Content: Thought content normal.        Judgment: Judgment normal.     Results for orders placed or performed in visit on 10/08/21  [facility]  Result Value Ref Range   Glucose 278 (H) 70 - 99 mg/dL   BUN 17 8 - 27 mg/dL   Creatinine, Ser 0.99 0.57 - 1.00 mg/dL   eGFR 62 >59 mL/min/1.73   BUN/Creatinine Ratio 17 12 - 28   Sodium 140 134 - 144 mmol/L   Potassium 4.3 3.5 - 5.2 mmol/L   Chloride 102 96 - 106 mmol/L   CO2 21 20 - 29 mmol/L   Calcium 10.1 8.7 - 10.3 mg/dL    Ear Cerumen Removal  Date/Time: 11/02/2021 1:50 PM  Performed by: Baruch Gouty, FNP Authorized by: Baruch Gouty, FNP   Anesthesia: Local Anesthetic: none Location details: left ear Patient tolerance: patient tolerated the procedure well with no immediate complications Comments: Unable to remove wax Procedure type: irrigation (curette)      Pertinent labs & imaging results that were available during my care of the patient were reviewed by me and considered  in my medical decision making.  Assessment & Plan:  Nathalya was seen today for cold like symptoms.  Diagnoses and all orders for this visit:  Acute laryngitis Viral testing pending. No indications of acute bacterial infection. Symptomatic care discussed in detail. Aware to report new, worsening, or persistent symptoms.  -     COVID-19, Flu A+B and RSV  Impacted cerumen of left ear Unable to remove cerumen in office. Debrox use discussed in detail. Pt to use for 2 weeks and then to return for removal.     Continue all other maintenance medications.  Follow up plan: Return in 2 weeks (on 11/16/2021), or if symptoms worsen or fail to improve, for cerumen removal.   Continue healthy lifestyle choices, including diet (rich in fruits, vegetables, and lean proteins, and low in salt and simple carbohydrates) and exercise (at least 30 minutes of moderate physical activity daily).  Educational handout given for laryngitis   The above assessment and management plan was discussed with the patient. The patient verbalized understanding of and has agreed to the management plan. Patient is aware to call the clinic if they develop any new symptoms or if symptoms persist or worsen. Patient is aware when to return to the clinic for a follow-up visit. Patient educated on when it is appropriate to go to the emergency department.   Monia Pouch, FNP-C [facility] [phone]

## 2021-11-02 NOTE — Patient Instructions (Signed)
Cepacol sore throat

## 2021-11-03 LAB — COVID-19, FLU A+B AND RSV
Influenza A, NAA: NOT DETECTED
Influenza B, NAA: NOT DETECTED
RSV, NAA: NOT DETECTED
SARS-CoV-2, NAA: NOT DETECTED

## 2021-11-05 ENCOUNTER — Telehealth: Payer: Self-pay | Admitting: Family Medicine

## 2021-11-05 ENCOUNTER — Ambulatory Visit: Payer: Medicare HMO | Admitting: Physician Assistant

## 2021-11-05 NOTE — Telephone Encounter (Signed)
Seen on 9/15 and was told she has laryngitis. Patient said her cough is no better, wants to know if there is anything that we recommend she take for it. Please call back.

## 2021-11-06 ENCOUNTER — Ambulatory Visit: Payer: Medicare HMO | Attending: Internal Medicine | Admitting: Internal Medicine

## 2021-11-06 ENCOUNTER — Encounter: Payer: Self-pay | Admitting: Internal Medicine

## 2021-11-06 VITALS — BP 128/80 | HR 84 | Ht 64.0 in | Wt 175.0 lb

## 2021-11-06 DIAGNOSIS — I471 Supraventricular tachycardia: Secondary | ICD-10-CM

## 2021-11-06 NOTE — Patient Instructions (Addendum)
Medication Instructions:  Stop Taking Amiodarone   *If you need a refill on your cardiac medications before your next appointment, please call your pharmacy*   Lab Work: Your physician recommends that you return for lab work in: Bmet, CBC   If you have labs (blood work) drawn today and your tests are completely normal, you will receive your results only by: MyChart Message (if you have MyChart) OR A paper copy in the mail If you have any lab test that is abnormal or we need to change your treatment, we will call you to review the results.   Testing/Procedures: Your physician has recommended that you have an ablation. Catheter ablation is a medical procedure used to treat some cardiac arrhythmias (irregular heartbeats). During catheter ablation, a long, thin, flexible tube is put into a blood vessel in your groin (upper thigh), or neck. This tube is called an ablation catheter. It is then guided to your heart through the blood vessel. Radio frequency waves destroy small areas of heart tissue where abnormal heartbeats may cause an arrhythmia to start. Please see the instruction sheet given to you today.    Follow-Up: At [facility], you and your health needs are our priority.  As part of our continuing mission to provide you with exceptional heart care, we have created designated Provider Care Teams.  These Care Teams include your primary Cardiologist (physician) and Advanced Practice Providers (APPs -  Physician Assistants and Nurse Practitioners) who all work together to provide you with the care you need, when you need it.  We recommend signing up for the patient portal called "MyChart".  Sign up information is provided on this After Visit Summary.  MyChart is used to connect with patients for Virtual Visits (Telemedicine).  Patients are able to view lab/test results, encounter notes, upcoming appointments, etc.  Non-urgent messages can be sent to your provider as well.   To learn  more about what you can do with MyChart, go to [email or web address].    Your next appointment:   4 -6 week(s)  The format for your next appointment:   In Person  Provider:   Cristopher Peru, MD    Other Instructions Thank you for choosing [facility]!  Dates for Ablation are Oct 9, 18, 27, Nov 1    Important Information About Sugar

## 2021-11-06 NOTE — Progress Notes (Signed)
HPI Carol Richmond is referred by Dr. Domenic Polite for evaluation of SVT. She is a pleasant 71 yo woman with a h/o fatty liver disease, HTN, and obesity who has developed a wide QRS tachycardia. She has baseline LBBB and her echo demonstrates that she has had a reduction in her EF from 55% to 35-40%. She has class 2 symptoms. She has not had syncope and she has only mild dyspnea.  No Known Allergies   Current Outpatient Medications  Medication Sig Dispense Refill   acetaminophen (TYLENOL) 500 MG tablet Take 500 mg by mouth every 6 (six) hours as needed for mild pain.     amiodarone (PACERONE) 200 MG tablet Take 1 tablet (200 mg total) by mouth daily. 30 tablet 6   aspirin EC 81 MG tablet Take 1 tablet (81 mg total) by mouth daily.     carvedilol (COREG) 3.125 MG tablet Take 1 tablet (3.125 mg total) by mouth 2 (two) times daily. 180 tablet 1   cholecalciferol (VITAMIN D) 1000 UNITS tablet Take 1,000 Units by mouth daily.     diclofenac Sodium (VOLTAREN) 1 % GEL Apply 4 g topically 4 (four) times daily. 350 g 1   DULoxetine (CYMBALTA) 60 MG capsule TAKE 1 CAPSULE BY MOUTH ONCE DAILY. 30 capsule 2   empagliflozin (JARDIANCE) 10 MG TABS tablet Take 1 tablet (10 mg total) by mouth daily before breakfast. 90 tablet 3   fluticasone (FLONASE) 50 MCG/ACT nasal spray Place 2 sprays into both nostrils as needed for allergies or rhinitis.     furosemide (LASIX) 40 MG tablet Take 1 tablet (40 mg total) by mouth daily. 90 tablet 1   gabapentin (NEURONTIN) 300 MG capsule Take 300 mg by mouth 3 (three) times daily as needed (pain).     lactulose (CHRONULAC) 10 GM/15ML solution TAKE 30 MLS (20 GRAM TOTAL) BY MOUTH 3 TIMES A DAY. 2700 mL 5   lamoTRIgine (LAMICTAL) 200 MG tablet Take 1 tablet (200 mg total) by mouth 2 (two) times daily. 180 tablet 3   levocetirizine (XYZAL) 5 MG tablet TAKE 1 TABLET EVERY EVENING. 30 tablet 5   LORazepam (ATIVAN) 0.5 MG tablet Take 1 tablet (0.5 mg total) by mouth 2 (two)  times daily as needed for anxiety. 30 tablet 5   meloxicam (MOBIC) 15 MG tablet Take 1 tablet (15 mg total) by mouth daily. 30 tablet 0   naloxone (NARCAN) nasal spray 4 mg/0.1 mL Place into the nose once.     ondansetron (ZOFRAN) 4 MG tablet Take 1 tablet (4 mg total) by mouth every 8 (eight) hours as needed for nausea or vomiting. 20 tablet 0   pantoprazole (PROTONIX) 40 MG tablet Take 40 mg by mouth 2 (two) times daily.     potassium chloride SA (KLOR-CON M) 20 MEQ tablet Take 2 tablets (40 mEq total) by mouth daily. 90 tablet 3   sacubitril-valsartan (ENTRESTO) 24-26 MG Take 1 tablet by mouth 2 (two) times daily. 180 tablet 3   No current facility-administered medications for this visit.     Past Medical History:  Diagnosis Date   Asthma    Carotid artery disease ([place])    Diverticulosis of colon (without mention of hemorrhage)    Essential hypertension    Functional ovarian cysts    GERD (gastroesophageal reflux disease)    Hemorrhoids, external, without mention of complication    Hormone replacement therapy (postmenopausal)    IBS (irritable bowel syndrome)    Migraine  Mitral valve disease    Right knee DJD    Seizures (HCC)    Sleep apnea    TIA (transient ischemic attack)    Type 2 diabetes mellitus (HCC)     ROS:   All systems reviewed and negative except as noted in the HPI.   Past Surgical History:  Procedure Laterality Date   BIOPSY  09/23/2018   Procedure: BIOPSY;  Surgeon: Rogene Houston, MD;  [facility];  Service: Endoscopy;;  duodeneum and stomach   CHOLECYSTECTOMY  1980   ENDARTERECTOMY Right 07/01/2017   Procedure: ENDARTERECTOMY CAROTID RIGHT;  Surgeon: Serafina Mitchell, MD;  Location: [facility] OR;  Service: Vascular;  Laterality: Right;   ESOPHAGOGASTRODUODENOSCOPY N/A 09/23/2018   Procedure: ESOPHAGOGASTRODUODENOSCOPY (EGD);  Surgeon: Rogene Houston, MD;  [facility]: [facility];  Service: Endoscopy;  Laterality: N/A;  2:45-moved to 1:45 per  Lelon Frohlich   ESOPHAGOGASTRODUODENOSCOPY (EGD) WITH PROPOFOL N/A 03/07/2021   Procedure: ESOPHAGOGASTRODUODENOSCOPY (EGD) WITH PROPOFOL;  Surgeon: Rogene Houston, MD;  [facility]: [facility];  Service: Endoscopy;  Laterality: N/A;  12:50   LEFT HEART CATHETERIZATION WITH CORONARY ANGIOGRAM N/A 11/18/2013   Procedure: LEFT HEART CATHETERIZATION WITH CORONARY ANGIOGRAM;  Surgeon: Burnell Blanks, MD;  Location: [facility] CATH LAB;  Service: Cardiovascular;  Laterality: N/A;   OOPHORECTOMY     2012 Dr. Adah Perl in [place] Right 07/01/2017   Procedure: RIGHT CAROTID PATCH ANGIOPLASTY;  Surgeon: Serafina Mitchell, MD;  Location: [place];  Service: Vascular;  Laterality: Right;   TONSILLECTOMY     TOTAL KNEE ARTHROPLASTY Right 09/02/2012   Procedure: TOTAL KNEE ARTHROPLASTY- right;  Surgeon: Ninetta Lights, MD;  Location: [place];  Service: Orthopedics;  Laterality: Right;   TUBAL LIGATION     TYMPANOMASTOIDECTOMY Left 04/02/2016   Procedure: LEFT CANAL WALL DOWN TYMPANOMASTOIDECTOMY;  Surgeon: Leta Baptist, MD;  Location: [place];  Service: ENT;  Laterality: Left;   VAGINAL HYSTERECTOMY  1990   WOUND EXPLORATION Right 07/02/2017   Procedure: RIGHT NECK EXPLORATION WITH INTRAOPERATIVE ULTRASOUND;  Surgeon: Angelia Mould, MD;  Location: [facility] OR;  Service: Vascular;  Laterality: Right;     Family History  Problem Relation Age of Onset   Heart disease Mother    Diabetes Mother    Heart disease Brother    Cancer Sister    Heart disease Brother    Heart disease Brother    Heart disease Son      Social History   Socioeconomic History   Marital status: Widowed    Spouse name: Not on file   Number of children: 3   Years of education: Not on file   Highest education level: Not on file  Occupational History   Occupation: retired  Tobacco Use   Smoking status: Never   Smokeless tobacco: Never  Vaping Use   Vaping Use: Never used  Substance and Sexual Activity    Alcohol use: No    Alcohol/week: 0.0 standard drinks of alcohol   Drug use: No   Sexual activity: Not Currently  Other Topics Concern   Not on file  Social History Narrative   Lives alone. Granddaughter lives across the street, her 3 children all live nearby   Social Determinants of Health   Financial Resource Strain: Low Risk  (10/02/2021)   Overall Financial Resource Strain (CARDIA)    Difficulty of Paying Living Expenses: Not very hard  Recent Concern: Financial Resource Strain - Medium Risk (  07/11/2021)   Overall Financial Resource Strain (CARDIA)    Difficulty of Paying Living Expenses: Somewhat hard  Food Insecurity: Food Insecurity Present (07/05/2021)   Hunger Vital Sign    Worried About Running Out of Food in the Last Year: Sometimes true    Ran Out of Food in the Last Year: Never true  Transportation Needs: No Transportation Needs (10/02/2021)   PRAPARE - Hydrologist (Medical): No    Lack of Transportation (Non-Medical): No  Physical Activity: Insufficiently Active (07/05/2021)   Exercise Vital Sign    Days of Exercise per Week: 7 days    Minutes of Exercise per Session: 20 min  Stress: No Stress Concern Present (07/05/2021)   [place]    Feeling of Stress : Not at all  Social Connections: Moderately Integrated (07/05/2021)   Social Connection and Isolation Panel [NHANES]    Frequency of Communication with Friends and Family: More than three times a week    Frequency of Social Gatherings with Friends and Family: More than three times a week    Attends Religious Services: More than 4 times per year    Active Member of [employer] or Organizations: Yes    Attends Archivist Meetings: More than 4 times per year    Marital Status: Widowed  Intimate Partner Violence: Not At Risk (07/05/2021)   Humiliation, Afraid, Rape, and Kick questionnaire    Fear of Current or Ex-Partner: No     Emotionally Abused: No    Physically Abused: No    Sexually Abused: No     BP 128/80   Pulse 84   Ht 5' 4"  (1.626 m)   Wt 175 lb (79.4 kg)   SpO2 97%   BMI 30.04 kg/m   Physical Exam:  Well appearing NAD HEENT: Unremarkable Neck:  No JVD, no thyromegally Lymphatics:  No adenopathy Back:  No CVA tenderness Lungs:  Clear with no wheezes HEART:  Regular rate rhythm, no murmurs, no rubs, no clicks Abd:  soft, positive bowel sounds, no organomegally, no rebound, no guarding Ext:  2 plus pulses, no edema, no cyanosis, no clubbing Skin:  No rashes no nodules Neuro:  CN II through XII intact, motor grossly intact  EKG - reviewed. NSR with LBBB   Assess/Plan:  Wide QRS tachy - I have discussed the treatment options with the patient and recommended proceeding with EP study and catheter ablation. She will call us if she wishes to proceed. Chronic systolic heart failure - she is class 1. Hopefully her EF will improve with medical therapy. LBBB - no h/o syncope. We will follow.  Carleene Overlie Deepak Bless,MD

## 2021-11-06 NOTE — Telephone Encounter (Signed)
PATIENT AWARE

## 2021-11-07 ENCOUNTER — Ambulatory Visit: Payer: Medicare HMO | Admitting: Family Medicine

## 2021-11-08 ENCOUNTER — Encounter: Payer: Self-pay | Admitting: Family Medicine

## 2021-11-08 ENCOUNTER — Ambulatory Visit (INDEPENDENT_AMBULATORY_CARE_PROVIDER_SITE_OTHER): Payer: Medicare HMO | Admitting: Family Medicine

## 2021-11-08 VITALS — BP 114/69 | HR 102 | Temp 97.0°F | Ht 64.0 in | Wt 170.5 lb

## 2021-11-08 DIAGNOSIS — J301 Allergic rhinitis due to pollen: Secondary | ICD-10-CM | POA: Diagnosis not present

## 2021-11-08 DIAGNOSIS — R051 Acute cough: Secondary | ICD-10-CM

## 2021-11-08 MED ORDER — BENZONATATE 100 MG PO CAPS: 100.0000 mg | ORAL_CAPSULE | Freq: Three times a day (TID) | ORAL | 0 refills | Status: DC | PRN

## 2021-11-08 MED ORDER — LEVOCETIRIZINE DIHYDROCHLORIDE 5 MG PO TABS: 5.0000 mg | ORAL_TABLET | Freq: Every evening | ORAL | 5 refills | Status: DC

## 2021-11-08 NOTE — Progress Notes (Signed)
Acute Office Visit  Subjective:     [name], female    DOB: 02/08/1951, 71 y.o.   MRN: [id number]  Chief Complaint  Patient presents with   Cough    Cough This is a new problem. Episode onset: 7 days. The problem has been gradually improving. The problem occurs hourly. The cough is Productive of sputum. Associated symptoms include nasal congestion. Pertinent negatives include no chest pain, chills, ear congestion, ear pain, fever, headaches, heartburn, hemoptysis, myalgias, postnasal drip, rhinorrhea, sore throat, shortness of breath, sweats, weight loss or wheezing. The symptoms are aggravated by pollens. She has tried OTC cough suppressant for the symptoms. The treatment provided moderate relief. Her past medical history is significant for environmental allergies.    Review of Systems  Constitutional:  Negative for chills, fever and weight loss.  HENT:  Negative for ear pain, postnasal drip, rhinorrhea and sore throat.   Respiratory:  Positive for cough. Negative for hemoptysis, shortness of breath and wheezing.   Cardiovascular:  Negative for chest pain.  Gastrointestinal:  Negative for heartburn.  Musculoskeletal:  Negative for myalgias.  Neurological:  Negative for headaches.  Endo/Heme/Allergies:  Positive for environmental allergies.        Objective:    BP 114/69   Pulse (!) 102   Temp (!) 97 F (36.1 C) (Temporal)   Ht 5' 4"  (1.626 m)   Wt 170 lb 8 oz (77.3 kg)   SpO2 97%   BMI 29.27 kg/m  Wt Readings from Last 3 Encounters:  11/08/21 170 lb 8 oz (77.3 kg)  11/06/21 175 lb (79.4 kg)  11/02/21 176 lb (79.8 kg)      Physical Exam Vitals and nursing note reviewed.  Constitutional:      General: She is not in acute distress.    Appearance: She is not ill-appearing, toxic-appearing or diaphoretic.  HENT:     Head: Normocephalic and atraumatic.     Right Ear: Tympanic membrane, ear canal and external ear normal.     Left Ear:  Tympanic membrane, ear canal and external ear normal.     Nose: Nose normal.     Mouth/Throat:     Mouth: Mucous membranes are moist.     Pharynx: Oropharynx is clear. No oropharyngeal exudate or posterior oropharyngeal erythema.  Eyes:     General:        Right eye: No discharge.        Left eye: No discharge.     Pupils: Pupils are equal, round, and reactive to light.  Cardiovascular:     Rate and Rhythm: Normal rate and regular rhythm.     Heart sounds: Normal heart sounds. No murmur heard. Pulmonary:     Effort: Pulmonary effort is normal. No respiratory distress.     Breath sounds: Normal breath sounds. No wheezing, rhonchi or rales.  Chest:     Chest wall: No tenderness.  Musculoskeletal:     Cervical back: Neck supple. No tenderness.     Right lower leg: No edema.     Left lower leg: No edema.  Skin:    General: Skin is warm and dry.  Neurological:     General: No focal deficit present.     Mental Status: She is alert and oriented to person, place, and time.  Psychiatric:        Mood and Affect: Mood normal.        Behavior: Behavior normal.     No results  found for any visits on 11/08/21.      Assessment & Plan:   Aubrianna was seen today for cough.  Diagnoses and all orders for this visit:  Acute cough Improving. Lungs clear on exam. Cough worse after being outside. Will reorder xyzal as she has not been taking this. Tessalon perles also order for prn use. Can continue mucinex.  -     benzonatate (TESSALON PERLES) 100 MG capsule; Take 1 capsule (100 mg total) by mouth 3 (three) times daily as needed.  Seasonal allergic rhinitis due to pollen -     levocetirizine (XYZAL) 5 MG tablet; Take 1 tablet (5 mg total) by mouth every evening.  Return if symptoms worsen or fail to improve.  The patient indicates understanding of these issues and agrees with the plan.  Gwenlyn Perking, FNP

## 2021-11-13 ENCOUNTER — Telehealth: Payer: Self-pay | Admitting: Internal Medicine

## 2021-11-13 ENCOUNTER — Encounter: Payer: Self-pay | Admitting: *Deleted

## 2021-11-13 DIAGNOSIS — I471 Supraventricular tachycardia: Secondary | ICD-10-CM

## 2021-11-13 DIAGNOSIS — Z01818 Encounter for other preprocedural examination: Secondary | ICD-10-CM

## 2021-11-13 NOTE — Telephone Encounter (Signed)
Pt would like a callback regarding surgery that was discussed with provider. Please advise

## 2021-11-13 NOTE — Telephone Encounter (Signed)
Pt stopped by office to pick up instructions for ablation on Oct. 27 with Dr. Lovena Le. Instructions given and all questions answered.

## 2021-11-15 ENCOUNTER — Encounter: Payer: Self-pay | Admitting: Family Medicine

## 2021-11-15 ENCOUNTER — Telehealth: Payer: Self-pay | Admitting: Family Medicine

## 2021-11-15 ENCOUNTER — Ambulatory Visit (INDEPENDENT_AMBULATORY_CARE_PROVIDER_SITE_OTHER): Payer: Medicare HMO | Admitting: Family Medicine

## 2021-11-15 DIAGNOSIS — R058 Other specified cough: Secondary | ICD-10-CM | POA: Diagnosis not present

## 2021-11-15 DIAGNOSIS — R051 Acute cough: Secondary | ICD-10-CM

## 2021-11-15 MED ORDER — BENZONATATE 200 MG PO CAPS: 200.0000 mg | ORAL_CAPSULE | Freq: Three times a day (TID) | ORAL | 3 refills | Status: DC | PRN

## 2021-11-15 MED ORDER — GUAIFENESIN-DM 100-10 MG/5ML PO SYRP: 5.0000 mL | ORAL_SOLUTION | ORAL | 0 refills | Status: DC | PRN

## 2021-11-15 NOTE — Telephone Encounter (Signed)
CALL RETURNED TO PATIENT  PATIENT APPROVED FOR [employer] (FOR JARDIANCE) INSTRUCTED PATIENT TO TAKE [employer] CARD TO LOCAL PHARMACY TO BILL FOR $0 WILL EXPIRE IN 1 YR

## 2021-11-15 NOTE — Progress Notes (Signed)
Virtual Visit via telephone Note  I connected with Carol Richmond on 11/15/21 at 1344 by telephone and verified that I am speaking with the correct person using two identifiers. Carol Richmond is currently located at home and patient are currently with her during visit. The provider, Fransisca Kaufmann Marris Frontera, MD is located in their office at time of visit.  Call ended at 1350  I discussed the limitations, risks, security and privacy concerns of performing an evaluation and management service by telephone and the availability of in person appointments. I also discussed with the patient that there may be a patient responsible charge related to this service. The patient expressed understanding and agreed to proceed.   History and Present Illness: Patient just got over laryngitis and still has a bad cough and is not getting better.  She says her throat is better and voice are better with lozenges. She has had the cough since then over the past month.  She is having coughing fits today.  And is coughing more this am.  She has some phlegm.  She used mucinex.   1. Post-viral cough syndrome   2. Acute cough     Outpatient Encounter Medications as of 11/15/2021  Medication Sig   guaiFENesin-dextromethorphan (ROBITUSSIN DM) 100-10 MG/5ML syrup Take 5 mLs by mouth every 4 (four) hours as needed for cough.   acetaminophen (TYLENOL) 500 MG tablet Take 500 mg by mouth every 6 (six) hours as needed for mild pain.   amiodarone (PACERONE) 200 MG tablet Take 1 tablet (200 mg total) by mouth daily.   aspirin EC 81 MG tablet Take 1 tablet (81 mg total) by mouth daily.   benzonatate (TESSALON) 200 MG capsule Take 1 capsule (200 mg total) by mouth 3 (three) times daily as needed.   carvedilol (COREG) 3.125 MG tablet Take 1 tablet (3.125 mg total) by mouth 2 (two) times daily.   cholecalciferol (VITAMIN D) 1000 UNITS tablet Take 1,000 Units by mouth daily.   diclofenac Sodium (VOLTAREN) 1 % GEL Apply 4 g  topically 4 (four) times daily.   DULoxetine (CYMBALTA) 60 MG capsule TAKE 1 CAPSULE BY MOUTH ONCE DAILY.   empagliflozin (JARDIANCE) 10 MG TABS tablet Take 1 tablet (10 mg total) by mouth daily before breakfast.   fluticasone (FLONASE) 50 MCG/ACT nasal spray Place 2 sprays into both nostrils as needed for allergies or rhinitis.   furosemide (LASIX) 40 MG tablet Take 1 tablet (40 mg total) by mouth daily.   gabapentin (NEURONTIN) 300 MG capsule Take 300 mg by mouth 3 (three) times daily as needed (pain).   lactulose (CHRONULAC) 10 GM/15ML solution TAKE 30 MLS (20 GRAM TOTAL) BY MOUTH 3 TIMES A DAY.   lamoTRIgine (LAMICTAL) 200 MG tablet Take 1 tablet (200 mg total) by mouth 2 (two) times daily.   levocetirizine (XYZAL) 5 MG tablet Take 1 tablet (5 mg total) by mouth every evening.   LORazepam (ATIVAN) 0.5 MG tablet Take 1 tablet (0.5 mg total) by mouth 2 (two) times daily as needed for anxiety.   meloxicam (MOBIC) 15 MG tablet Take 1 tablet (15 mg total) by mouth daily.   naloxone (NARCAN) nasal spray 4 mg/0.1 mL Place into the nose once.   ondansetron (ZOFRAN) 4 MG tablet Take 1 tablet (4 mg total) by mouth every 8 (eight) hours as needed for nausea or vomiting.   pantoprazole (PROTONIX) 40 MG tablet Take 40 mg by mouth 2 (two) times daily.   potassium chloride SA (  KLOR-CON M) 20 MEQ tablet Take 2 tablets (40 mEq total) by mouth daily.   sacubitril-valsartan (ENTRESTO) 24-26 MG Take 1 tablet by mouth 2 (two) times daily.   [DISCONTINUED] benzonatate (TESSALON PERLES) 100 MG capsule Take 1 capsule (100 mg total) by mouth 3 (three) times daily as needed.   No facility-administered encounter medications on file as of 11/15/2021.    Review of Systems  Constitutional:  Negative for chills and fever.  HENT:  Positive for congestion, postnasal drip, rhinorrhea and voice change. Negative for ear discharge, ear pain, sinus pressure, sneezing and sore throat.   Eyes:  Negative for pain, redness and  visual disturbance.  Respiratory:  Positive for cough. Negative for chest tightness and shortness of breath.   Cardiovascular:  Negative for chest pain and leg swelling.  Genitourinary:  Negative for difficulty urinating and dysuria.  Musculoskeletal:  Negative for back pain and gait problem.  Skin:  Negative for rash.  Neurological:  Negative for dizziness, light-headedness and headaches.  Psychiatric/Behavioral:  Negative for agitation and behavioral problems.   All other systems reviewed and are negative.   Observations/Objective: Patient sounds comfortable and in no acute distress  Assessment and Plan: Problem List Items Addressed This Visit   None Visit Diagnoses     Post-viral cough syndrome    -  Primary   Relevant Medications   benzonatate (TESSALON) 200 MG capsule   guaiFENesin-dextromethorphan (ROBITUSSIN DM) 100-10 MG/5ML syrup   Acute cough       Relevant Medications   benzonatate (TESSALON) 200 MG capsule   guaiFENesin-dextromethorphan (ROBITUSSIN DM) 100-10 MG/5ML syrup       Sent in cough syrup and stronger dose of Tessalon Perles for the patient Follow up plan: Return if symptoms worsen or fail to improve.     I discussed the assessment and treatment plan with the patient. The patient was provided an opportunity to ask questions and all were answered. The patient agreed with the plan and demonstrated an understanding of the instructions.   The patient was advised to call back or seek an in-person evaluation if the symptoms worsen or if the condition fails to improve as anticipated.  The above assessment and management plan was discussed with the patient. The patient verbalized understanding of and has agreed to the management plan. Patient is aware to call the clinic if symptoms persist or worsen. Patient is aware when to return to the clinic for a follow-up visit. Patient educated on when it is appropriate to go to the emergency department.    I provided 6  minutes of non-face-to-face time during this encounter.    Worthy Rancher, MD

## 2021-11-19 ENCOUNTER — Other Ambulatory Visit: Payer: Medicare HMO

## 2021-11-19 DIAGNOSIS — Z79899 Other long term (current) drug therapy: Secondary | ICD-10-CM | POA: Diagnosis not present

## 2021-11-19 DIAGNOSIS — I050 Rheumatic mitral stenosis: Secondary | ICD-10-CM | POA: Diagnosis not present

## 2021-11-19 DIAGNOSIS — I471 Supraventricular tachycardia, unspecified: Secondary | ICD-10-CM | POA: Diagnosis not present

## 2021-11-20 ENCOUNTER — Other Ambulatory Visit: Payer: Self-pay | Admitting: Family Medicine

## 2021-11-20 DIAGNOSIS — I10 Essential (primary) hypertension: Secondary | ICD-10-CM

## 2021-11-20 LAB — BASIC METABOLIC PANEL
BUN/Creatinine Ratio: 14 (ref 12–28)
BUN: 13 mg/dL (ref 8–27)
CO2: 22 mmol/L (ref 20–29)
Calcium: 9.3 mg/dL (ref 8.7–10.3)
Chloride: 99 mmol/L (ref 96–106)
Creatinine, Ser: 0.96 mg/dL (ref 0.57–1.00)
Glucose: 172 mg/dL — ABNORMAL HIGH (ref 70–99)
Potassium: 4.2 mmol/L (ref 3.5–5.2)
Sodium: 138 mmol/L (ref 134–144)
eGFR: 64 mL/min/{1.73_m2} (ref >59)

## 2021-11-20 NOTE — Progress Notes (Unsigned)
Cardiology Office Note    Date:  11/21/2021   ID:  Carol Richmond, DOB 02-02-1951, MRN [id number]  PCP:  Gwenlyn Perking, FNP  Cardiologist: Rozann Lesches, MD    Chief Complaint  Patient presents with   Follow-up    6 week visit    History of Present Illness:    AMYLIA Richmond is a 71 y.o. female with past medical history of HFrEF (EF previously reduced with cath in 2015 showing mild nonobstructive CAD, EF at 55-60% by echo in 01/2021, further reduced at 35-40% in 09/2021), carotid artery stenosis (s/p R CEA in 2019), chronic LBBB, mitral stenosis, Type 2 DM, HTN and GERD who presents to the office today for 6-week follow-up.  She was examined by Dr. Domenic Polite in 09/2021 following a recent admission for SVT and had required Amiodarone for suppression. Echocardiogram during admission showed her EF was reduced at 35 to 40% and she was noted to have moderate to severe mitral valve regurgitation and moderate to severe mitral stenosis. It was unclear if her cardiomyopathy was due to tachycardia or progressive mitral valve disease, therefore was recommend to optimize GDMT and Coreg 3.125 mg twice daily was added to her medication regimen along with her being continued on Entresto, Lasix and Jardiance. Was recommended to consider the addition of Spironolactone at her follow-up visit and ultimately would likely need follow-up with CT Surgery for consideration of MVR (scheduled for repeat echo in 12/2021).   She did meet with Dr. Lovena Le in 10/2021 and catheter ablation was recommended for SVT. This is tentatively scheduled for 12/14/2021.  In talking with the patient and her family member today, she reports still having dyspnea on exertion with minimal activity such as carrying the groceries into her home. She denies any associated chest pain with this. Denies any specific palpitations resembling her prior SVT.  No recent orthopnea, PND or pitting edema.  She has been without her Lasix  for several days and is unsure of her current cardiac medications as she did have difficulty obtaining some of these from her pharmacy.   Past Medical History:  Diagnosis Date   Asthma    Carotid artery disease ([place])    Diverticulosis of colon (without mention of hemorrhage)    Essential hypertension    Functional ovarian cysts    GERD (gastroesophageal reflux disease)    Hemorrhoids, external, without mention of complication    Hormone replacement therapy (postmenopausal)    IBS (irritable bowel syndrome)    Migraine    Mitral valve disease    Right knee DJD    Seizures ([facility])    Sleep apnea    TIA (transient ischemic attack)    Type 2 diabetes mellitus ([place])     Past Surgical History:  Procedure Laterality Date   BIOPSY  09/23/2018   Procedure: BIOPSY;  Surgeon: Rogene Houston, MD;  [facility];  Service: Endoscopy;;  duodeneum and stomach   CHOLECYSTECTOMY  1980   ENDARTERECTOMY Right 07/01/2017   Procedure: ENDARTERECTOMY CAROTID RIGHT;  Surgeon: Serafina Mitchell, MD;  Location: [facility];  Service: Vascular;  Laterality: Right;   ESOPHAGOGASTRODUODENOSCOPY N/A 09/23/2018   Procedure: ESOPHAGOGASTRODUODENOSCOPY (EGD);  Surgeon: Rogene Houston, MD;  [facility];  Service: Endoscopy;  Laterality: N/A;  2:45-moved to 1:45 per Lelon Frohlich   ESOPHAGOGASTRODUODENOSCOPY (EGD) WITH PROPOFOL N/A 03/07/2021   Procedure: ESOPHAGOGASTRODUODENOSCOPY (EGD) WITH PROPOFOL;  Surgeon: Rogene Houston, MD;  [facility];  Service: Endoscopy;  Laterality: N/A;  12:50   LEFT HEART CATHETERIZATION WITH CORONARY ANGIOGRAM N/A 11/18/2013   Procedure: LEFT HEART CATHETERIZATION WITH CORONARY ANGIOGRAM;  Surgeon: Burnell Blanks, MD;  Location: [facility] CATH LAB;  Service: Cardiovascular;  Laterality: N/A;   OOPHORECTOMY     2012 Dr. Adah Perl in [place] Right 07/01/2017   Procedure: RIGHT CAROTID PATCH ANGIOPLASTY;  Surgeon: Serafina Mitchell, MD;  Location: [place];  Service: Vascular;  Laterality: Right;   TONSILLECTOMY     TOTAL KNEE ARTHROPLASTY Right 09/02/2012   Procedure: TOTAL KNEE ARTHROPLASTY- right;  Surgeon: Ninetta Lights, MD;  Location: [place];  Service: Orthopedics;  Laterality: Right;   TUBAL LIGATION     TYMPANOMASTOIDECTOMY Left 04/02/2016   Procedure: LEFT CANAL WALL DOWN TYMPANOMASTOIDECTOMY;  Surgeon: Leta Baptist, MD;  Location: [place];  Service: ENT;  Laterality: Left;   [place] Right 07/02/2017   Procedure: RIGHT NECK EXPLORATION WITH INTRAOPERATIVE ULTRASOUND;  Surgeon: Angelia Mould, MD;  Location: [place];  Service: Vascular;  Laterality: Right;    Current Medications: Outpatient Medications Prior to Visit  Medication Sig Dispense Refill   acetaminophen (TYLENOL) 500 MG tablet Take 500 mg by mouth every 6 (six) hours as needed for mild pain.     aspirin EC 81 MG tablet Take 1 tablet (81 mg total) by mouth daily.     benzonatate (TESSALON) 200 MG capsule Take 1 capsule (200 mg total) by mouth 3 (three) times daily as needed. 30 capsule 3   cholecalciferol (VITAMIN D) 1000 UNITS tablet Take 1,000 Units by mouth daily.     diclofenac Sodium (VOLTAREN) 1 % GEL Apply 4 g topically 4 (four) times daily. 350 g 1   DULoxetine (CYMBALTA) 60 MG capsule TAKE 1 CAPSULE BY MOUTH ONCE DAILY. 30 capsule 2   empagliflozin (JARDIANCE) 10 MG TABS tablet Take 1 tablet (10 mg total) by mouth daily before breakfast. 90 tablet 3   fluticasone (FLONASE) 50 MCG/ACT nasal spray Place 2 sprays into both nostrils as needed for allergies or rhinitis.     gabapentin (NEURONTIN) 300 MG capsule Take 300 mg by mouth 3 (three) times daily as needed (pain).     guaiFENesin-dextromethorphan (ROBITUSSIN DM) 100-10 MG/5ML syrup Take 5 mLs by mouth every 4 (four) hours as needed for cough. 118 mL 0   lactulose (CHRONULAC) 10 GM/15ML solution TAKE 30 MLS (20 GRAM TOTAL) BY MOUTH 3 TIMES A DAY. 2700 mL 5    lamoTRIgine (LAMICTAL) 200 MG tablet Take 1 tablet (200 mg total) by mouth 2 (two) times daily. 180 tablet 3   levocetirizine (XYZAL) 5 MG tablet Take 1 tablet (5 mg total) by mouth every evening. 30 tablet 5   LORazepam (ATIVAN) 0.5 MG tablet Take 1 tablet (0.5 mg total) by mouth 2 (two) times daily as needed for anxiety. 30 tablet 5   meloxicam (MOBIC) 15 MG tablet Take 1 tablet (15 mg total) by mouth daily. 30 tablet 0   naloxone (NARCAN) nasal spray 4 mg/0.1 mL Place into the nose once.     ondansetron (ZOFRAN) 4 MG tablet Take 1 tablet (4 mg total) by mouth every 8 (eight) hours as needed for nausea or vomiting. 20 tablet 0   pantoprazole (PROTONIX) 40 MG tablet Take 40 mg by mouth 2 (two) times daily.     potassium chloride SA (KLOR-CON M) 20 MEQ tablet Take 2 tablets (40 mEq total) by  mouth daily. 90 tablet 3   sacubitril-valsartan (ENTRESTO) 24-26 MG Take 1 tablet by mouth 2 (two) times daily. 180 tablet 3   amiodarone (PACERONE) 200 MG tablet Take 1 tablet (200 mg total) by mouth daily. 30 tablet 6   carvedilol (COREG) 3.125 MG tablet Take 1 tablet (3.125 mg total) by mouth 2 (two) times daily. 180 tablet 1   furosemide (LASIX) 40 MG tablet TAKE 1 TABLET ONCE DAILY. 30 tablet 0   No facility-administered medications prior to visit.     Allergies:   Patient has no known allergies.   Social History   Socioeconomic History   Marital status: Widowed    Spouse name: Not on file   Number of children: 3   Years of education: Not on file   Highest education level: Not on file  Occupational History   Occupation: retired  Tobacco Use   Smoking status: Never   Smokeless tobacco: Never  Vaping Use   Vaping Use: Never used  Substance and Sexual Activity   Alcohol use: No    Alcohol/week: 0.0 standard drinks of alcohol   Drug use: No   Sexual activity: Not Currently  Other Topics Concern   Not on file  Social History Narrative   Lives alone. Granddaughter lives across the street,  her 3 children all live nearby   Social Determinants of Health   Financial Resource Strain: Low Risk  (10/02/2021)   Overall Financial Resource Strain (CARDIA)    Difficulty of Paying Living Expenses: Not very hard  Recent Concern: Financial Resource Strain - Medium Risk (07/11/2021)   Overall Financial Resource Strain (CARDIA)    Difficulty of Paying Living Expenses: Somewhat hard  Food Insecurity: Food Insecurity Present (07/05/2021)   Hunger Vital Sign    Worried About Running Out of Food in the Last Year: Sometimes true    Ran Out of Food in the Last Year: Never true  Transportation Needs: No Transportation Needs (10/02/2021)   PRAPARE - Hydrologist (Medical): No    Lack of Transportation (Non-Medical): No  Physical Activity: Insufficiently Active (07/05/2021)   Exercise Vital Sign    Days of Exercise per Week: 7 days    Minutes of Exercise per Session: 20 min  Stress: No Stress Concern Present (07/05/2021)   [place]    Feeling of Stress : Not at all  Social Connections: Moderately Integrated (07/05/2021)   Social Connection and Isolation Panel [NHANES]    Frequency of Communication with Friends and Family: More than three times a week    Frequency of Social Gatherings with Friends and Family: More than three times a week    Attends Religious Services: More than 4 times per year    Active Member of [employer] or Organizations: Yes    Attends Archivist Meetings: More than 4 times per year    Marital Status: Widowed     Family History:  The patient's family history includes Cancer in her sister; Diabetes in her mother; Heart disease in her brother, brother, brother, mother, and son.   Review of Systems:    Please see the history of present illness.     All other systems reviewed and are otherwise negative except as noted above.   Physical Exam:    VS:  BP 122/76   Pulse 73    Ht 5' 4"  (1.626 m)   Wt 175 lb (79.4 kg)   SpO2  95%   BMI 30.04 kg/m    General: Well developed, well nourished,female appearing in no acute distress. Head: Normocephalic, atraumatic. Neck: No carotid bruits. JVD not elevated.  Lungs: Respirations regular and unlabored, without wheezes or rales.  Heart: Regular rate and rhythm. No S3 or S4.  2/6 systolic murmur along [place].  Abdomen: Appears non-distended. No obvious abdominal masses. Msk:  Strength and tone appear normal for age. No obvious joint deformities or effusions. Extremities: No clubbing or cyanosis. No pitting edema.  Distal pedal pulses are 2+ bilaterally. Neuro: Alert and oriented X 3. Moves all extremities spontaneously. No focal deficits noted. Psych:  Responds to questions appropriately with a normal affect. Skin: No rashes or lesions noted  Wt Readings from Last 3 Encounters:  11/21/21 175 lb (79.4 kg)  11/08/21 170 lb 8 oz (77.3 kg)  11/06/21 175 lb (79.4 kg)     Studies/Labs Reviewed:   EKG:  EKG is not ordered today.   Recent Labs: 09/28/2021: B Natriuretic Peptide 1,025.0 09/29/2021: Magnesium 1.9; TSH 1.066 10/18/2021: ALT 19; Hemoglobin 12.9; Platelets 233 11/19/2021: BUN 13; Creatinine, Ser 0.96; Potassium 4.2; Sodium 138   Lipid Panel    Component Value Date/Time   CHOL 213 (H) 09/12/2021 0927   TRIG 380 (H) 09/12/2021 0927   HDL 47 09/12/2021 0927   CHOLHDL 4.5 (H) 09/12/2021 0927   CHOLHDL 5.2 05/08/2007 0530   VLDL 38 05/08/2007 0530   LDLCALC 102 (H) 09/12/2021 0927    Additional studies/ records that were reviewed today include:   Echocardiogram: 09/2021 IMPRESSIONS     1. Left ventricular ejection fraction, by estimation, is 35 to 40%. Left  ventricular ejection fraction by 3D volume is 40 %. The left ventricle has  moderately decreased function. The left ventricle demonstrates global  hypokinesis. Left ventricular  diastolic parameters are consistent with Grade I diastolic  dysfunction  (impaired relaxation). Elevated left atrial pressure.   2. Right ventricular systolic function is normal. The right ventricular  size is normal.   3. Left atrial size was moderately dilated.   4. The mitral valve is rheumatic. Moderate to severe mitral valve  regurgitation. Moderate-to-severe mitral stenosis. MVA by continuity is  1.4cm2 by continuity. The mean mitral valve gradient is 12.0 mmHg with HR  84bpm. Consider TEE for further  evaluation if clinically indicated.   5. The aortic valve was not well visualized. There is mild calcification  of the aortic valve. There is mild thickening of the aortic valve. Aortic  valve regurgitation is mild.   6. The inferior vena cava is normal in size with greater than 50%  respiratory variability, suggesting right atrial pressure of 3 mmHg.   Assessment:    1. HFrEF (heart failure with reduced ejection fraction) ([place])   2. PSVT (paroxysmal supraventricular tachycardia)   3. Bilateral extracranial carotid artery stenosis   4. Mitral valve disease   5. Essential hypertension      Plan:   In order of problems listed above:  1. HFrEF - Her EF was reduced at 35-40% in 09/2021 and possibly tachycardia mediated or due to progressive mitral valve disease. She continues to have dyspnea on exertion but denies any orthopnea, PND or pitting edema. - She is unsure of her exact current cardiac medications but is listed as taking Coreg 3.125 mg twice daily, Jardiance 10 mg daily, Entresto 24-26 mg twice daily and Lasix 40 mg daily. I have asked her to call back to verify her medications. If she is  taking these, would plan to add Spironolactone 12.5 mg daily (creatinine stable at 0.96 with K+ at 4.2 by recent labs on 11/19/2021).  If adding Spironolactone, can likely reduce K-dur to 20 mEq daily with a close follow-up BMET. She is already scheduled for a follow-up echocardiogram in 12/2021 for reassessment of her EF and mitral valve.  2.  SVT - She was previously evaluated by Dr. Lovena Le and is scheduled for an ablation on 12/14/2021. I did review her case with Dr. Lovena Le and he recommended stopping Amiodarone at this time given her upcoming ablation. Continue Coreg 3.125 mg twice daily.  3. Carotid Artery Stenosis  - She is s/p R CEA in 2019. Followed by Vascular Surgery. Dopplers in 09/2020 showed 1-39% stenosis bilaterally. Continue ASA 81 mg daily. Her LDL was at 102 when checked earlier this year and she declined statin therapy by review of notes. Would readdress at follow-up visits.  4. Mitral Valve Disease - Echocardiogram in 09/2021 showed moderate to severe mitral regurgitation and moderate to severe mitral stenosis. She is scheduled for a follow-up echocardiogram in 12/2021 which will help guide further management.  5. HTN - Her blood pressure is well-controlled at 122/76 during today's visit. Continue Coreg and Entresto with plans to likely add Spironolactone as outlined above.    Medication Adjustments/Labs and Tests Ordered: Current medicines are reviewed at length with the patient today.  Concerns regarding medicines are outlined above.  Medication changes, Labs and Tests ordered today are listed in the Patient Instructions below. Patient Instructions  Medication Instructions:   STOP AMIODARONE (PACERONE).   Call back to verify your other heart medications.   *If you need a refill on your cardiac medications before your next appointment, please call your pharmacy*  Follow-Up: At [facility], you and your health needs are our priority.  As part of our continuing mission to provide you with exceptional heart care, we have created designated Provider Care Teams.  These Care Teams include your primary Cardiologist (physician) and Advanced Practice Providers (APPs -  Physician Assistants and Nurse Practitioners) who all work together to provide you with the care you need, when you need it.  We recommend  signing up for the patient portal called "MyChart".  Sign up information is provided on this After Visit Summary.  MyChart is used to connect with patients for Virtual Visits (Telemedicine).  Patients are able to view lab/test results, encounter notes, upcoming appointments, etc.  Non-urgent messages can be sent to your provider as well.   To learn more about what you can do with MyChart, go to [email or web address].    Your next appointment:   Keep scheduled follow-up for an echocardiogram in 12/2021 and appointment with Dr. Domenic Polite in 01/2022.  Important Information About Sugar         Signed, Erma Heritage, PA-C  11/21/2021 5:43 PM    [place] [address] [place], [place] [address] Phone: [phone] Fax: [phone]

## 2021-11-21 ENCOUNTER — Encounter: Payer: Self-pay | Admitting: Student

## 2021-11-21 ENCOUNTER — Ambulatory Visit: Payer: Medicare HMO | Attending: Student | Admitting: Student

## 2021-11-21 VITALS — BP 122/76 | HR 73 | Ht 64.0 in | Wt 175.0 lb

## 2021-11-21 DIAGNOSIS — I471 Supraventricular tachycardia, unspecified: Secondary | ICD-10-CM

## 2021-11-21 DIAGNOSIS — I059 Rheumatic mitral valve disease, unspecified: Secondary | ICD-10-CM | POA: Diagnosis not present

## 2021-11-21 DIAGNOSIS — I5020 Unspecified systolic (congestive) heart failure: Secondary | ICD-10-CM | POA: Diagnosis not present

## 2021-11-21 DIAGNOSIS — I6523 Occlusion and stenosis of bilateral carotid arteries: Secondary | ICD-10-CM | POA: Diagnosis not present

## 2021-11-21 DIAGNOSIS — I10 Essential (primary) hypertension: Secondary | ICD-10-CM | POA: Diagnosis not present

## 2021-11-21 MED ORDER — FUROSEMIDE 40 MG PO TABS: 40.0000 mg | ORAL_TABLET | Freq: Every day | ORAL | 3 refills | Status: DC

## 2021-11-21 MED ORDER — CARVEDILOL 3.125 MG PO TABS: 3.1250 mg | ORAL_TABLET | Freq: Two times a day (BID) | ORAL | 3 refills | Status: DC

## 2021-11-21 NOTE — Patient Instructions (Signed)
Medication Instructions:   STOP AMIODARONE (PACERONE).   Call back to verify your other heart medications.   *If you need a refill on your cardiac medications before your next appointment, please call your pharmacy*  Follow-Up: At [facility], you and your health needs are our priority.  As part of our continuing mission to provide you with exceptional heart care, we have created designated Provider Care Teams.  These Care Teams include your primary Cardiologist (physician) and Advanced Practice Providers (APPs -  Physician Assistants and Nurse Practitioners) who all work together to provide you with the care you need, when you need it.  We recommend signing up for the patient portal called "MyChart".  Sign up information is provided on this After Visit Summary.  MyChart is used to connect with patients for Virtual Visits (Telemedicine).  Patients are able to view lab/test results, encounter notes, upcoming appointments, etc.  Non-urgent messages can be sent to your provider as well.   To learn more about what you can do with MyChart, go to [email or web address].    Your next appointment:   Keep scheduled follow-up for an echocardiogram in 12/2021 and appointment with Dr. Domenic Polite in 01/2022.  Important Information About Sugar

## 2021-11-22 ENCOUNTER — Telehealth: Payer: Self-pay | Admitting: Cardiology

## 2021-11-22 DIAGNOSIS — I5020 Unspecified systolic (congestive) heart failure: Secondary | ICD-10-CM

## 2021-11-22 DIAGNOSIS — E1165 Type 2 diabetes mellitus with hyperglycemia: Secondary | ICD-10-CM

## 2021-11-22 MED ORDER — EMPAGLIFLOZIN 10 MG PO TABS: 10.0000 mg | ORAL_TABLET | Freq: Every day | ORAL | 3 refills | Status: DC

## 2021-11-22 NOTE — Telephone Encounter (Signed)
Pt was advised to stop Amiodarone per [place], PA-C.  Pt informed to contact PCP regarding Voltaren.   Jardiance 10 mg r/f per pt's request.

## 2021-11-22 NOTE — Telephone Encounter (Signed)
Pt c/o medication issue:  1. Name of Medication:  Voltoren  Amiodarone  2. How are you currently taking this medication (dosage and times per day)?   3. Are you having a reaction (difficulty breathing--STAT)?   4. What is your medication issue?   Patient is requesting to clarify medication instructions given yesterday regarding Voltaren and Amiodarone.

## 2021-11-23 ENCOUNTER — Ambulatory Visit (INDEPENDENT_AMBULATORY_CARE_PROVIDER_SITE_OTHER): Payer: Medicare HMO | Admitting: Family Medicine

## 2021-11-23 ENCOUNTER — Encounter: Payer: Self-pay | Admitting: Family Medicine

## 2021-11-23 VITALS — BP 101/59 | HR 79 | Temp 98.0°F | Ht 64.0 in | Wt 172.5 lb

## 2021-11-23 DIAGNOSIS — S91311D Laceration without foreign body, right foot, subsequent encounter: Secondary | ICD-10-CM | POA: Diagnosis not present

## 2021-11-23 NOTE — Progress Notes (Signed)
   Acute Office Visit  Subjective:     [name], female    DOB: 18-Jan-1951, 71 y.o.   MRN: [id number]  Chief Complaint  Patient presents with   Wound Check    Wound Check   Patient is in today for a laceration on the back of her right heel that occurred about  month ago. She was evaluated the day after it occurred. She was not sure what she had cut her heel on. She missed the window for sutures. There was no signs of infection. She was instructed on home wound care. She reports that she has mostly been keeping the area dressed. She has been applying neosporin intermittent. She reports some intermittent clear drainage. Denies pain, swelling, erythema, fever, or chills. She would just like to have it looked at today  because it has been taking so long to heal.   ROS As per HPI.      Objective:    BP (!) 101/59   Pulse 79   Temp 98 F (36.7 C) (Temporal)   Ht 5' 4"  (1.626 m)   Wt 172 lb 8 oz (78.2 kg)   SpO2 95%   BMI 29.61 kg/m    Physical Exam Vitals and nursing note reviewed.  Constitutional:      General: She is not in acute distress.    Appearance: She is not ill-appearing, toxic-appearing or diaphoretic.  Pulmonary:     Effort: Pulmonary effort is normal. No respiratory distress.  Skin:    Findings: Laceration (3 cm laceration to posterior right ankle. Small amount of clear exudate. No erythema, tenderness, or swelling.) present.  Neurological:     Mental Status: She is alert and oriented to person, place, and time. Mental status is at baseline.  Psychiatric:        Mood and Affect: Mood normal.        Behavior: Behavior normal.     No results found for any visits on 11/23/21.      Assessment & Plan:   Letzy was seen today for wound check.  Diagnoses and all orders for this visit:  Laceration of right heel, subsequent encounter Healing well. Laceration cleansed and simple dressing applied. Discussed home wound care and follow up  precautions.    Return to office for new or worsening symptoms, or if symptoms persist.   The patient indicates understanding of these issues and agrees with the plan.   Gwenlyn Perking, FNP

## 2021-11-28 ENCOUNTER — Telehealth: Payer: Self-pay | Admitting: Family Medicine

## 2021-11-28 MED ORDER — ACCU-CHEK SOFTCLIX LANCETS MISC: 3 refills | Status: DC

## 2021-11-28 MED ORDER — ACCU-CHEK MULTICLIX LANCETS MISC: 12 refills | Status: DC

## 2021-11-28 NOTE — Telephone Encounter (Signed)
Changed to Accu-Chek softclik lancets

## 2021-11-28 NOTE — Telephone Encounter (Signed)
Pt requesting rx for lancets to be sent to pharmacy. Rx sent and pt is aware.

## 2021-11-28 NOTE — Telephone Encounter (Signed)
Lancets (ACCU-CHEK MULTICLIX) lancets Multiclix are not available. Needing a switch on this rx. Please call back

## 2021-12-03 ENCOUNTER — Ambulatory Visit (INDEPENDENT_AMBULATORY_CARE_PROVIDER_SITE_OTHER): Payer: Medicare HMO | Admitting: Family Medicine

## 2021-12-03 ENCOUNTER — Encounter: Payer: Self-pay | Admitting: Family Medicine

## 2021-12-03 VITALS — BP 107/63 | HR 68 | Temp 97.0°F | Ht 64.0 in | Wt 172.0 lb

## 2021-12-03 DIAGNOSIS — E113392 Type 2 diabetes mellitus with moderate nonproliferative diabetic retinopathy without macular edema, left eye: Secondary | ICD-10-CM | POA: Diagnosis not present

## 2021-12-03 DIAGNOSIS — R569 Unspecified convulsions: Secondary | ICD-10-CM

## 2021-12-03 DIAGNOSIS — F1320 Sedative, hypnotic or anxiolytic dependence, uncomplicated: Secondary | ICD-10-CM | POA: Diagnosis not present

## 2021-12-03 DIAGNOSIS — Z23 Encounter for immunization: Secondary | ICD-10-CM

## 2021-12-03 DIAGNOSIS — F411 Generalized anxiety disorder: Secondary | ICD-10-CM | POA: Diagnosis not present

## 2021-12-03 DIAGNOSIS — K7460 Unspecified cirrhosis of liver: Secondary | ICD-10-CM

## 2021-12-03 DIAGNOSIS — E1169 Type 2 diabetes mellitus with other specified complication: Secondary | ICD-10-CM | POA: Diagnosis not present

## 2021-12-03 DIAGNOSIS — E1165 Type 2 diabetes mellitus with hyperglycemia: Secondary | ICD-10-CM | POA: Diagnosis not present

## 2021-12-03 DIAGNOSIS — I471 Supraventricular tachycardia, unspecified: Secondary | ICD-10-CM | POA: Diagnosis not present

## 2021-12-03 DIAGNOSIS — E1159 Type 2 diabetes mellitus with other circulatory complications: Secondary | ICD-10-CM | POA: Diagnosis not present

## 2021-12-03 DIAGNOSIS — I5022 Chronic systolic (congestive) heart failure: Secondary | ICD-10-CM

## 2021-12-03 DIAGNOSIS — F339 Major depressive disorder, recurrent, unspecified: Secondary | ICD-10-CM | POA: Diagnosis not present

## 2021-12-03 DIAGNOSIS — I152 Hypertension secondary to endocrine disorders: Secondary | ICD-10-CM

## 2021-12-03 DIAGNOSIS — E785 Hyperlipidemia, unspecified: Secondary | ICD-10-CM

## 2021-12-03 DIAGNOSIS — Z79899 Other long term (current) drug therapy: Secondary | ICD-10-CM

## 2021-12-03 MED ORDER — LORAZEPAM 0.5 MG PO TABS: 0.5000 mg | ORAL_TABLET | Freq: Two times a day (BID) | ORAL | 5 refills | Status: DC | PRN

## 2021-12-03 MED ORDER — LAMOTRIGINE 200 MG PO TABS: 200.0000 mg | ORAL_TABLET | Freq: Two times a day (BID) | ORAL | 3 refills | Status: DC

## 2021-12-03 NOTE — Patient Instructions (Signed)
Diabetes Mellitus and Foot Care Foot care is an important part of your health, especially when you have diabetes. Diabetes may cause you to have problems because of poor blood flow (circulation) to your feet and legs, which can cause your skin to: Become thinner and drier. Break more easily. Heal more slowly. Peel and crack. You may also have nerve damage (neuropathy) in your legs and feet, causing decreased feeling in them. This means that you may not notice minor injuries to your feet that could lead to more serious problems. Noticing and addressing any potential problems early is the best way to prevent future foot problems. How to care for your feet Foot hygiene  Wash your feet daily with warm water and mild soap. Do not use hot water. Then, pat your feet and the areas between your toes until they are completely dry. Do not soak your feet as this can dry your skin. Trim your toenails straight across. Do not dig under them or around the cuticle. File the edges of your nails with an emery board or nail file. Apply a moisturizing lotion or petroleum jelly to the skin on your feet and to dry, brittle toenails. Use lotion that does not contain alcohol and is unscented. Do not apply lotion between your toes. Shoes and socks Wear clean socks or stockings every day. Make sure they are not too tight. Do not wear knee-high stockings since they may decrease blood flow to your legs. Wear shoes that fit properly and have enough cushioning. Always look in your shoes before you put them on to be sure there are no objects inside. To break in new shoes, wear them for just a few hours a day. This prevents injuries on your feet. Wounds, scrapes, corns, and calluses  Check your feet daily for blisters, cuts, bruises, sores, and redness. If you cannot see the bottom of your feet, use a mirror or ask someone for help. Do not cut corns or calluses or try to remove them with medicine. If you find a minor scrape,  cut, or break in the skin on your feet, keep it and the skin around it clean and dry. You may clean these areas with mild soap and water. Do not clean the area with peroxide, alcohol, or iodine. If you have a wound, scrape, corn, or callus on your foot, look at it several times a day to make sure it is healing and not infected. Check for: Redness, swelling, or pain. Fluid or blood. Warmth. Pus or a bad smell. General tips Do not cross your legs. This may decrease blood flow to your feet. Do not use heating pads or hot water bottles on your feet. They may burn your skin. If you have lost feeling in your feet or legs, you may not know this is happening until it is too late. Protect your feet from hot and cold by wearing shoes, such as at the beach or on hot pavement. Schedule a complete foot exam at least once a year (annually) or more often if you have foot problems. Report any cuts, sores, or bruises to your health care provider immediately. Where to find more information American Diabetes Association: www.diabetes.org Association of Diabetes Care & Education Specialists: www.diabeteseducator.org Contact a health care provider if: You have a medical condition that increases your risk of infection and you have any cuts, sores, or bruises on your feet. You have an injury that is not healing. You have redness on your legs or feet. You   feel burning or tingling in your legs or feet. You have pain or cramps in your legs and feet. Your legs or feet are numb. Your feet always feel cold. You have pain around any toenails. Get help right away if: You have a wound, scrape, corn, or callus on your foot and: You have pain, swelling, or redness that gets worse. You have fluid or blood coming from the wound, scrape, corn, or callus. Your wound, scrape, corn, or callus feels warm to the touch. You have pus or a bad smell coming from the wound, scrape, corn, or callus. You have a fever. You have a red  line going up your leg. Summary Check your feet every day for blisters, cuts, bruises, sores, and redness. Apply a moisturizing lotion or petroleum jelly to the skin on your feet and to dry, brittle toenails. Wear shoes that fit properly and have enough cushioning. If you have foot problems, report any cuts, sores, or bruises to your health care provider immediately. Schedule a complete foot exam at least once a year (annually) or more often if you have foot problems. This information is not intended to replace advice given to you by your health care provider. Make sure you discuss any questions you have with your health care provider. Document Revised: 08/26/2019 Document Reviewed: 08/26/2019 Elsevier Patient Education  2023 Elsevier Inc.  

## 2021-12-03 NOTE — Progress Notes (Signed)
Established Patient Office Visit  Subjective:  [name], female    DOB: 22-Feb-1950  Age: 71 y.o. MRN: [id number]  CC:  Chief Complaint  Patient presents with   Medical Management of Chronic Issues   Diabetes    HPI  1. HTN Complaint with meds - Yes Current Medications - Coreg, lasix, and Entresto Checking BP at home: no Exercising Regularly - No Watching Salt intake - No Pertinent ROS:  Headache - No Fatigue - No Visual Disturbances - No Chest pain - No Dyspnea - No Palpitations - No LE edema - No  She has a cardiac ablation scheduled for 12/14/21. She has a repeat echo scheduled in November.   2. Diabetes A1c was 6.8 on 09/29/21 while in the hospital. She has since been started on Jardiance. She does not check her blood sugars at home. She is on an ARB. She has declined a statin numerous times. She has had an eye exam this year. She is interested in a podiatry referral as she has difficulty cutting her toenails due to the thickness.   3. Depression/anxiety Takes ativan BID and has for years. She isn't sure if she is still taking cymbalta or not but it looks like she has been out of refills.      12/03/2021   10:56 AM 11/23/2021    3:17 PM 11/08/2021    9:15 AM  Depression screen PHQ 2/9  Decreased Interest 0 0 0  Down, Depressed, Hopeless 0 0 0  PHQ - 2 Score 0 0 0  Altered sleeping 0 0 0  Tired, decreased energy 0 0 0  Change in appetite 0 0 0  Feeling bad or failure about yourself  3 0 0  Trouble concentrating 0 0 0  Moving slowly or fidgety/restless 0 0 0  Suicidal thoughts 0 0 0  PHQ-9 Score 3 0 0  Difficult doing work/chores Not difficult at all Not difficult at all Not difficult at all      12/03/2021   10:57 AM 11/23/2021    3:17 PM 11/08/2021    9:16 AM 11/02/2021    1:57 PM  GAD 7 : Generalized Anxiety Score  Nervous, Anxious, on Edge 0 0 0 0  Control/stop worrying 0 0 0 0  Worry too much - different things 0 0 0 0  Trouble  relaxing 0 0 0 0  Restless 0 0 0 0  Easily annoyed or irritable 0 0 0 0  Afraid - awful might happen 0 0 0 0  Total GAD 7 Score 0 0 0 0  Anxiety Difficulty Not difficult at all Not difficult at all Not difficult at all Not difficult at all    Past Medical History:  Diagnosis Date   Asthma    Carotid artery disease ([place])    Diverticulosis of colon (without mention of hemorrhage)    Essential hypertension    Functional ovarian cysts    GERD (gastroesophageal reflux disease)    Hemorrhoids, external, without mention of complication    Hormone replacement therapy (postmenopausal)    IBS (irritable bowel syndrome)    Migraine    Mitral valve disease    Right knee DJD    Seizures (HCC)    Sleep apnea    TIA (transient ischemic attack)    Type 2 diabetes mellitus ([place])     Past Surgical History:  Procedure Laterality Date   BIOPSY  09/23/2018   Procedure: BIOPSY;  Surgeon: Rogene Houston,  MD;  [facility];  Service: Endoscopy;;  duodeneum and stomach   CHOLECYSTECTOMY  1980   ENDARTERECTOMY Right 07/01/2017   Procedure: ENDARTERECTOMY CAROTID RIGHT;  Surgeon: Serafina Mitchell, MD;  Location: [facility] OR;  Service: Vascular;  Laterality: Right;   ESOPHAGOGASTRODUODENOSCOPY N/A 09/23/2018   Procedure: ESOPHAGOGASTRODUODENOSCOPY (EGD);  Surgeon: Rogene Houston, MD;  [facility];  Service: Endoscopy;  Laterality: N/A;  2:45-moved to 1:45 per Lelon Frohlich   ESOPHAGOGASTRODUODENOSCOPY (EGD) WITH PROPOFOL N/A 03/07/2021   Procedure: ESOPHAGOGASTRODUODENOSCOPY (EGD) WITH PROPOFOL;  Surgeon: Rogene Houston, MD;  [facility];  Service: Endoscopy;  Laterality: N/A;  12:50   LEFT HEART CATHETERIZATION WITH CORONARY ANGIOGRAM N/A 11/18/2013   Procedure: LEFT HEART CATHETERIZATION WITH CORONARY ANGIOGRAM;  Surgeon: Burnell Blanks, MD;  Location: [facility] CATH LAB;  Service: Cardiovascular;  Laterality: N/A;   OOPHORECTOMY     2012 Dr. Adah Perl in [place]  Right 07/01/2017   Procedure: RIGHT CAROTID PATCH ANGIOPLASTY;  Surgeon: Serafina Mitchell, MD;  Location: [place];  Service: Vascular;  Laterality: Right;   TONSILLECTOMY     TOTAL KNEE ARTHROPLASTY Right 09/02/2012   Procedure: TOTAL KNEE ARTHROPLASTY- right;  Surgeon: Ninetta Lights, MD;  Location: [place];  Service: Orthopedics;  Laterality: Right;   TUBAL LIGATION     TYMPANOMASTOIDECTOMY Left 04/02/2016   Procedure: LEFT CANAL WALL DOWN TYMPANOMASTOIDECTOMY;  Surgeon: Leta Baptist, MD;  Location: [place];  Service: ENT;  Laterality: Left;   VAGINAL HYSTERECTOMY  1990   WOUND EXPLORATION Right 07/02/2017   Procedure: RIGHT NECK EXPLORATION WITH INTRAOPERATIVE ULTRASOUND;  Surgeon: Angelia Mould, MD;  Location: [facility] OR;  Service: Vascular;  Laterality: Right;    Family History  Problem Relation Age of Onset   Heart disease Mother    Diabetes Mother    Heart disease Brother    Cancer Sister    Heart disease Brother    Heart disease Brother    Heart disease Son     Social History   Socioeconomic History   Marital status: Widowed    Spouse name: Not on file   Number of children: 3   Years of education: Not on file   Highest education level: Not on file  Occupational History   Occupation: retired  Tobacco Use   Smoking status: Never   Smokeless tobacco: Never  Vaping Use   Vaping Use: Never used  Substance and Sexual Activity   Alcohol use: No    Alcohol/week: 0.0 standard drinks of alcohol   Drug use: No   Sexual activity: Not Currently  Other Topics Concern   Not on file  Social History Narrative   Lives alone. Granddaughter lives across the street, her 3 children all live nearby   Social Determinants of Health   Financial Resource Strain: Low Risk  (10/02/2021)   Overall Financial Resource Strain (CARDIA)    Difficulty of Paying Living Expenses: Not very hard  Recent Concern: Financial Resource Strain - Medium Risk (07/11/2021)   Overall Financial  Resource Strain (CARDIA)    Difficulty of Paying Living Expenses: Somewhat hard  Food Insecurity: Food Insecurity Present (07/05/2021)   Hunger Vital Sign    Worried About Running Out of Food in the Last Year: Sometimes true    Ran Out of Food in the Last Year: Never true  Transportation Needs: No Transportation Needs (10/02/2021)   PRAPARE - Hydrologist (Medical):  No    Lack of Transportation (Non-Medical): No  Physical Activity: Insufficiently Active (07/05/2021)   Exercise Vital Sign    Days of Exercise per Week: 7 days    Minutes of Exercise per Session: 20 min  Stress: No Stress Concern Present (07/05/2021)   [place]    Feeling of Stress : Not at all  Social Connections: Moderately Integrated (07/05/2021)   Social Connection and Isolation Panel [NHANES]    Frequency of Communication with Friends and Family: More than three times a week    Frequency of Social Gatherings with Friends and Family: More than three times a week    Attends Religious Services: More than 4 times per year    Active Member of [employer] or Organizations: Yes    Attends Archivist Meetings: More than 4 times per year    Marital Status: Widowed  Intimate Partner Violence: Not At Risk (07/05/2021)   Humiliation, Afraid, Rape, and Kick questionnaire    Fear of Current or Ex-Partner: No    Emotionally Abused: No    Physically Abused: No    Sexually Abused: No    Outpatient Medications Prior to Visit  Medication Sig Dispense Refill   Accu-Chek Softclix Lancets lancets check blood sugars twice daily DX E11.65 200 each 3   acetaminophen (TYLENOL) 500 MG tablet Take 500 mg by mouth every 6 (six) hours as needed for mild pain.     aspirin EC 81 MG tablet Take 1 tablet (81 mg total) by mouth daily.     benzonatate (TESSALON) 200 MG capsule Take 1 capsule (200 mg total) by mouth 3 (three) times daily as needed. 30  capsule 3   carvedilol (COREG) 3.125 MG tablet Take 1 tablet (3.125 mg total) by mouth 2 (two) times daily. 180 tablet 3   cholecalciferol (VITAMIN D) 1000 UNITS tablet Take 1,000 Units by mouth daily.     diclofenac Sodium (VOLTAREN) 1 % GEL Apply 4 g topically 4 (four) times daily. 350 g 1   DULoxetine (CYMBALTA) 60 MG capsule TAKE 1 CAPSULE BY MOUTH ONCE DAILY. 30 capsule 2   empagliflozin (JARDIANCE) 10 MG TABS tablet Take 1 tablet (10 mg total) by mouth daily before breakfast. 90 tablet 3   fluticasone (FLONASE) 50 MCG/ACT nasal spray Place 2 sprays into both nostrils as needed for allergies or rhinitis.     furosemide (LASIX) 40 MG tablet Take 1 tablet (40 mg total) by mouth daily. 90 tablet 3   gabapentin (NEURONTIN) 300 MG capsule Take 300 mg by mouth 3 (three) times daily as needed (pain).     guaiFENesin-dextromethorphan (ROBITUSSIN DM) 100-10 MG/5ML syrup Take 5 mLs by mouth every 4 (four) hours as needed for cough. 118 mL 0   lactulose (CHRONULAC) 10 GM/15ML solution TAKE 30 MLS (20 GRAM TOTAL) BY MOUTH 3 TIMES A DAY. 2700 mL 5   lamoTRIgine (LAMICTAL) 200 MG tablet Take 1 tablet (200 mg total) by mouth 2 (two) times daily. 180 tablet 3   levocetirizine (XYZAL) 5 MG tablet Take 1 tablet (5 mg total) by mouth every evening. 30 tablet 5   LORazepam (ATIVAN) 0.5 MG tablet Take 1 tablet (0.5 mg total) by mouth 2 (two) times daily as needed for anxiety. 30 tablet 5   meloxicam (MOBIC) 15 MG tablet Take 1 tablet (15 mg total) by mouth daily. 30 tablet 0   naloxone (NARCAN) nasal spray 4 mg/0.1 mL Place into the nose once.  ondansetron (ZOFRAN) 4 MG tablet Take 1 tablet (4 mg total) by mouth every 8 (eight) hours as needed for nausea or vomiting. 20 tablet 0   pantoprazole (PROTONIX) 40 MG tablet Take 40 mg by mouth 2 (two) times daily.     potassium chloride SA (KLOR-CON M) 20 MEQ tablet Take 2 tablets (40 mEq total) by mouth daily. 90 tablet 3   sacubitril-valsartan (ENTRESTO) 24-26 MG  Take 1 tablet by mouth 2 (two) times daily. 180 tablet 3   No facility-administered medications prior to visit.    No Known Allergies  ROS Review of Systems Negative unless specially indicated above in HPI.   Objective:    Physical Exam Vitals and nursing note reviewed.  Constitutional:      General: She is not in acute distress.    Appearance: She is not ill-appearing, toxic-appearing or diaphoretic.  HENT:     Head: Normocephalic and atraumatic.     Nose: Nose normal.  Eyes:     Conjunctiva/sclera: Conjunctivae normal.     Pupils: Pupils are equal, round, and reactive to light.  Cardiovascular:     Rate and Rhythm: Normal rate and regular rhythm.     Heart sounds: Normal heart sounds. No murmur heard. Pulmonary:     Effort: Pulmonary effort is normal. No respiratory distress.     Breath sounds: Normal breath sounds.  Abdominal:     General: Bowel sounds are normal. There is no distension.     Palpations: Abdomen is soft.     Tenderness: There is no abdominal tenderness. There is no guarding or rebound.  Musculoskeletal:     Right lower leg: No edema.     Left lower leg: No edema.  Skin:    General: Skin is warm and dry.  Neurological:     General: No focal deficit present.     Mental Status: She is alert and oriented to person, place, and time.  Psychiatric:        Mood and Affect: Mood normal.        Behavior: Behavior normal.     BP 107/63   Pulse 68   Temp (!) 97 F (36.1 C) (Temporal)   Ht 5' 4"  (1.626 m)   Wt 172 lb (78 kg)   SpO2 99%   BMI 29.52 kg/m  Wt Readings from Last 3 Encounters:  12/03/21 172 lb (78 kg)  11/23/21 172 lb 8 oz (78.2 kg)  11/21/21 175 lb (79.4 kg)    Diabetic Foot Exam - Simple   Simple Foot Form Diabetic Foot exam was performed with the following findings: Yes 12/03/2021 11:33 AM  Visual Inspection No deformities, no ulcerations, no other skin breakdown bilaterally: Yes Sensation Testing Intact to touch and  monofilament testing bilaterally: Yes Pulse Check Posterior Tibialis and Dorsalis pulse intact bilaterally: Yes Comments Thickened yellow toe nails bilaterally     Health Maintenance Due  Topic Date Due   FOOT EXAM  Never done   OPHTHALMOLOGY EXAM  Never done   DEXA SCAN  Never done   Diabetic kidney evaluation - Urine ACR  03/29/2021    There are no preventive care reminders to display for this patient.  Lab Results  Component Value Date   TSH 1.066 09/29/2021   Lab Results  Component Value Date   WBC 7.3 10/18/2021   HGB 12.9 10/18/2021   HCT 39.0 10/18/2021   MCV 88.2 10/18/2021   PLT 233 10/18/2021   Lab Results  Component Value  Date   NA 138 11/19/2021   K 4.2 11/19/2021   CO2 22 11/19/2021   GLUCOSE 172 (H) 11/19/2021   BUN 13 11/19/2021   CREATININE 0.96 11/19/2021   BILITOT 0.4 10/18/2021   ALKPHOS 112 09/12/2021   AST 32 10/18/2021   ALT 19 10/18/2021   PROT 7.2 10/18/2021   ALBUMIN 4.3 09/12/2021   CALCIUM 9.3 11/19/2021   ANIONGAP 7 09/30/2021   EGFR 64 11/19/2021   Lab Results  Component Value Date   CHOL 213 (H) 09/12/2021   Lab Results  Component Value Date   HDL 47 09/12/2021   Lab Results  Component Value Date   LDLCALC 102 (H) 09/12/2021   Lab Results  Component Value Date   TRIG 380 (H) 09/12/2021   Lab Results  Component Value Date   CHOLHDL 4.5 (H) 09/12/2021   Lab Results  Component Value Date   HGBA1C 6.8 (H) 09/29/2021      Assessment & Plan:   Jenness was seen today for medical management of chronic issues and diabetes.  Diagnoses and all orders for this visit:  Controlled type 2 diabetes mellitus with hyperglycemia, without long-term current use of insulin (HCC) Recent a1c is at goal of <7. Continue jardiance. Foot exam today, also palced referral to podiatry. Eye exam is UTD, will request records. On ARB. Has declined statin numerous times.  -     Ambulatory referral to [place] /  creatinine urine ratio  Moderate nonproliferative diabetic retinopathy of left eye without macular edema associated with type 2 diabetes mellitus ([place]) Eye exam UTD.   Hyperlipidemia associated with type 2 diabetes mellitus ([place]) Not on statin, she has declined many times. Last LDL was 102.   Hypertension associated with type 2 diabetes mellitus ([place]) Well controlled on current regimen. Recent metabolic panel was normal.   SVT (supraventricular tachycardia) On coreg. Scheduled for ablation on 12/14/21.  Chronic HFrEF (heart failure with reduced ejection fraction) ([place]) Managed by cardiology. On entresto, lasix, coreg, jardiance. Has echo scheduled for next month.   Depression, recurrent ([place]) Negative screening today.   Generalized anxiety disorder Benzodiazepine dependence ([place]) Controlled substance agreement signed Well controlled on current regimen. PDMP reviewed, no red flags. Refills provided. CSA and UDS are UTD.  -     LORazepam (ATIVAN) 0.5 MG tablet; Take 1 tablet (0.5 mg total) by mouth 2 (two) times daily as needed for anxiety.  Cirrhosis Managed by GI. On lactulose. Recent liver enzymes were normal.   Partial seizure (HCC) Well controlled on current regimen.  -     lamoTRIgine (LAMICTAL) 200 MG tablet; Take 1 tablet (200 mg total) by mouth 2 (two) times daily.  Need for immunization against influenza -     Flu Vaccine QUAD High Dose(Fluad)  Follow-up: Return in about 3 months (around 03/05/2022) for chronic follow up.   The patient indicates understanding of these issues and agrees with the plan.   Gwenlyn Perking, FNP

## 2021-12-04 LAB — MICROALBUMIN / CREATININE URINE RATIO
Creatinine, Urine: 9.9 mg/dL
Microalbumin, Urine: <3 ug/mL

## 2021-12-05 ENCOUNTER — Ambulatory Visit (INDEPENDENT_AMBULATORY_CARE_PROVIDER_SITE_OTHER): Payer: Medicare HMO | Admitting: *Deleted

## 2021-12-05 ENCOUNTER — Telehealth: Payer: Self-pay | Admitting: Pharmacist

## 2021-12-05 ENCOUNTER — Other Ambulatory Visit (INDEPENDENT_AMBULATORY_CARE_PROVIDER_SITE_OTHER): Payer: Self-pay | Admitting: Gastroenterology

## 2021-12-05 DIAGNOSIS — E1165 Type 2 diabetes mellitus with hyperglycemia: Secondary | ICD-10-CM

## 2021-12-05 MED ORDER — ACCU-CHEK GUIDE VI STRP: ORAL_STRIP | 12 refills | Status: DC

## 2021-12-05 NOTE — Telephone Encounter (Signed)
Accu chek guide education provided Refills sent for supplies

## 2021-12-05 NOTE — Progress Notes (Signed)
Patient educated on use of accu check  soft click device

## 2021-12-10 ENCOUNTER — Telehealth: Payer: Self-pay | Admitting: Internal Medicine

## 2021-12-10 NOTE — Telephone Encounter (Signed)
  Pt has a procedure tomorrow and she has some questions about her medications. She request if the nurse can call her back today

## 2021-12-10 NOTE — Telephone Encounter (Signed)
Spoke to K. Pinnix, LPN (Dr. Tanna Furry nurse) for verification of medications to hold prior to procedure.   Hold: -Lasix -Coreg

## 2021-12-10 NOTE — Telephone Encounter (Signed)
Patient requesting to come to the office at 4:30 pm today to review medications to take/hold prior to procedure on 10/27- SVT Ablation. Pt will come to the office at 4:30 pm.

## 2021-12-12 ENCOUNTER — Telehealth: Payer: Self-pay | Admitting: Family Medicine

## 2021-12-12 NOTE — Telephone Encounter (Signed)
lmtcb

## 2021-12-13 ENCOUNTER — Telehealth: Payer: Medicare HMO

## 2021-12-13 ENCOUNTER — Other Ambulatory Visit: Payer: Self-pay | Admitting: Family Medicine

## 2021-12-13 DIAGNOSIS — I10 Essential (primary) hypertension: Secondary | ICD-10-CM

## 2021-12-13 MED ORDER — SODIUM CHLORIDE 0.9 % IV SOLN: INTRAVENOUS | Status: DC

## 2021-12-13 NOTE — Pre-Procedure Instructions (Signed)
Attempted to call patient regarding procedure instructions.  Left voice mail on the following items: Arrival time 0530 Nothing to eat or drink after midnight No meds AM of procedure Responsible person to drive you home and stay with you for 24 hrs

## 2021-12-14 ENCOUNTER — Ambulatory Visit (HOSPITAL_COMMUNITY)
Admission: RE | Disposition: A | Discharge status: Home / Self Care | Payer: Medicare HMO | Source: Home / Self Care | Attending: Internal Medicine

## 2021-12-14 ENCOUNTER — Encounter (HOSPITAL_COMMUNITY): Payer: Self-pay | Admitting: Internal Medicine

## 2021-12-14 ENCOUNTER — Encounter (HOSPITAL_COMMUNITY): Payer: Self-pay | Admitting: Certified Registered Nurse Anesthetist

## 2021-12-14 ENCOUNTER — Ambulatory Visit (HOSPITAL_COMMUNITY)
Admission: RE | Admit: 2021-12-14 | Discharge: 2021-12-15 | Disposition: A | Discharge status: Home / Self Care | Payer: Medicare HMO | Attending: Internal Medicine | Admitting: Internal Medicine

## 2021-12-14 DIAGNOSIS — I5022 Chronic systolic (congestive) heart failure: Secondary | ICD-10-CM | POA: Insufficient documentation

## 2021-12-14 DIAGNOSIS — K219 Gastro-esophageal reflux disease without esophagitis: Secondary | ICD-10-CM | POA: Diagnosis not present

## 2021-12-14 DIAGNOSIS — I052 Rheumatic mitral stenosis with insufficiency: Secondary | ICD-10-CM | POA: Insufficient documentation

## 2021-12-14 DIAGNOSIS — I447 Left bundle-branch block, unspecified: Secondary | ICD-10-CM

## 2021-12-14 DIAGNOSIS — Z5941 Food insecurity: Secondary | ICD-10-CM | POA: Diagnosis not present

## 2021-12-14 DIAGNOSIS — Z7985 Long-term (current) use of injectable non-insulin antidiabetic drugs: Secondary | ICD-10-CM | POA: Diagnosis not present

## 2021-12-14 DIAGNOSIS — I110 Hypertensive heart disease with heart failure: Secondary | ICD-10-CM | POA: Diagnosis not present

## 2021-12-14 DIAGNOSIS — I442 Atrioventricular block, complete: Secondary | ICD-10-CM

## 2021-12-14 DIAGNOSIS — E119 Type 2 diabetes mellitus without complications: Secondary | ICD-10-CM | POA: Diagnosis not present

## 2021-12-14 DIAGNOSIS — I6529 Occlusion and stenosis of unspecified carotid artery: Secondary | ICD-10-CM | POA: Insufficient documentation

## 2021-12-14 DIAGNOSIS — I471 Supraventricular tachycardia, unspecified: Secondary | ICD-10-CM

## 2021-12-14 DIAGNOSIS — I2510 Atherosclerotic heart disease of native coronary artery without angina pectoris: Secondary | ICD-10-CM | POA: Insufficient documentation

## 2021-12-14 HISTORY — PX: SVT ABLATION: EP1225

## 2021-12-14 HISTORY — PX: PACEMAKER IMPLANT: EP1218

## 2021-12-14 LAB — CBC
HCT: 41.3 % (ref 36.0–46.0)
Hemoglobin: 13.3 g/dL (ref 12.0–15.0)
MCH: 28.9 pg (ref 26.0–34.0)
MCHC: 32.2 g/dL (ref 30.0–36.0)
MCV: 89.6 fL (ref 80.0–100.0)
Platelets: 163 10*3/uL (ref 150–400)
RBC: 4.61 MIL/uL (ref 3.87–5.11)
RDW: 14.7 % (ref 11.5–15.5)
WBC: 7.7 10*3/uL (ref 4.0–10.5)
nRBC: 0 % (ref 0.0–0.2)

## 2021-12-14 LAB — GLUCOSE, CAPILLARY
Glucose-Capillary: 155 mg/dL — ABNORMAL HIGH (ref 70–99)
Glucose-Capillary: 143 mg/dL — ABNORMAL HIGH (ref 70–99)

## 2021-12-14 SURGERY — SVT ABLATION

## 2021-12-14 MED ORDER — LIDOCAINE HCL 1 % IJ SOLN
INTRAMUSCULAR | Status: AC
  Filled 2021-12-14: qty 60

## 2021-12-14 MED ORDER — ISOPROTERENOL HCL 0.2 MG/ML IJ SOLN
INTRAVENOUS | Status: DC | PRN
  Administered 2021-12-14: 4 ug/min via INTRAVENOUS

## 2021-12-14 MED ORDER — LACTULOSE 10 GM/15ML PO SOLN
30.0000 g | Freq: Every day | ORAL | Status: DC
  Administered 2021-12-14 – 2021-12-15 (×2): 30 g via ORAL
  Filled 2021-12-14 (×2): qty 45

## 2021-12-14 MED ORDER — SODIUM CHLORIDE 0.9 % IV SOLN
INTRAVENOUS | Status: DC | PRN
  Administered 2021-12-14: 80 mg

## 2021-12-14 MED ORDER — CARVEDILOL 3.125 MG PO TABS
3.1250 mg | ORAL_TABLET | Freq: Two times a day (BID) | ORAL | Status: DC
  Administered 2021-12-14 – 2021-12-15 (×3): 3.125 mg via ORAL
  Filled 2021-12-14 (×3): qty 1

## 2021-12-14 MED ORDER — HEPARIN (PORCINE) IN NACL 1000-0.9 UT/500ML-% IV SOLN
INTRAVENOUS | Status: AC
  Filled 2021-12-14: qty 500

## 2021-12-14 MED ORDER — BUPIVACAINE HCL (PF) 0.25 % IJ SOLN
INTRAMUSCULAR | Status: DC | PRN
  Administered 2021-12-14: 40 mL

## 2021-12-14 MED ORDER — SODIUM CHLORIDE 0.9 % IV SOLN
INTRAVENOUS | Status: AC
  Filled 2021-12-14: qty 2

## 2021-12-14 MED ORDER — CEFAZOLIN SODIUM-DEXTROSE 2-3 GM-%(50ML) IV SOLR
INTRAVENOUS | Status: AC | PRN
  Administered 2021-12-14: 2 g via INTRAVENOUS

## 2021-12-14 MED ORDER — CEFAZOLIN SODIUM-DEXTROSE 2-4 GM/100ML-% IV SOLN
INTRAVENOUS | Status: AC
  Filled 2021-12-14: qty 100

## 2021-12-14 MED ORDER — GABAPENTIN 300 MG PO CAPS
300.0000 mg | ORAL_CAPSULE | Freq: Three times a day (TID) | ORAL | Status: DC | PRN
  Administered 2021-12-14: 300 mg via ORAL
  Filled 2021-12-14: qty 1

## 2021-12-14 MED ORDER — CEFAZOLIN SODIUM-DEXTROSE 1-4 GM/50ML-% IV SOLN
1.0000 g | Freq: Four times a day (QID) | INTRAVENOUS | Status: AC
  Administered 2021-12-14 – 2021-12-15 (×3): 1 g via INTRAVENOUS
  Filled 2021-12-14 (×3): qty 50

## 2021-12-14 MED ORDER — FENTANYL CITRATE (PF) 100 MCG/2ML IJ SOLN
INTRAMUSCULAR | Status: AC
  Filled 2021-12-14: qty 2

## 2021-12-14 MED ORDER — LAMOTRIGINE 100 MG PO TABS
200.0000 mg | ORAL_TABLET | Freq: Two times a day (BID) | ORAL | Status: DC
  Administered 2021-12-14 – 2021-12-15 (×2): 200 mg via ORAL
  Filled 2021-12-14 (×2): qty 2

## 2021-12-14 MED ORDER — SACUBITRIL-VALSARTAN 24-26 MG PO TABS
1.0000 | ORAL_TABLET | Freq: Two times a day (BID) | ORAL | Status: DC
  Administered 2021-12-14 – 2021-12-15 (×2): 1 via ORAL
  Filled 2021-12-14 (×2): qty 1

## 2021-12-14 MED ORDER — MIDAZOLAM HCL 5 MG/5ML IJ SOLN
INTRAMUSCULAR | Status: AC
  Filled 2021-12-14: qty 5

## 2021-12-14 MED ORDER — LORAZEPAM 0.5 MG PO TABS: 0.5000 mg | ORAL_TABLET | Freq: Two times a day (BID) | ORAL | Status: DC | PRN

## 2021-12-14 MED ORDER — ONDANSETRON HCL 4 MG/2ML IJ SOLN: 4.0000 mg | Freq: Four times a day (QID) | INTRAMUSCULAR | Status: DC | PRN

## 2021-12-14 MED ORDER — LORATADINE 10 MG PO TABS
5.0000 mg | ORAL_TABLET | Freq: Every evening | ORAL | Status: DC
  Filled 2021-12-14: qty 1

## 2021-12-14 MED ORDER — PANTOPRAZOLE SODIUM 20 MG PO TBEC
20.0000 mg | DELAYED_RELEASE_TABLET | Freq: Every day | ORAL | Status: DC
  Administered 2021-12-14 – 2021-12-15 (×2): 20 mg via ORAL
  Filled 2021-12-14 (×2): qty 1

## 2021-12-14 MED ORDER — ONDANSETRON HCL 4 MG PO TABS: 4.0000 mg | ORAL_TABLET | Freq: Three times a day (TID) | ORAL | Status: DC | PRN

## 2021-12-14 MED ORDER — HEPARIN (PORCINE) IN NACL 1000-0.9 UT/500ML-% IV SOLN
INTRAVENOUS | Status: DC | PRN
  Administered 2021-12-14 (×3): 500 mL

## 2021-12-14 MED ORDER — MIDAZOLAM HCL 5 MG/5ML IJ SOLN
INTRAMUSCULAR | Status: DC | PRN
  Administered 2021-12-14: 2 mg via INTRAVENOUS
  Administered 2021-12-14 (×3): 1 mg via INTRAVENOUS
  Administered 2021-12-14: 2 mg via INTRAVENOUS
  Administered 2021-12-14: 1 mg via INTRAVENOUS
  Administered 2021-12-14: 2 mg via INTRAVENOUS
  Administered 2021-12-14: 1 mg via INTRAVENOUS

## 2021-12-14 MED ORDER — LIDOCAINE HCL (PF) 1 % IJ SOLN
INTRAMUSCULAR | Status: DC | PRN
  Administered 2021-12-14: 50 mL

## 2021-12-14 MED ORDER — BUPIVACAINE HCL (PF) 0.25 % IJ SOLN
INTRAMUSCULAR | Status: AC
  Filled 2021-12-14: qty 30

## 2021-12-14 MED ORDER — ISOPROTERENOL HCL 0.2 MG/ML IJ SOLN
INTRAMUSCULAR | Status: AC
  Filled 2021-12-14: qty 5

## 2021-12-14 MED ORDER — ACETAMINOPHEN 325 MG PO TABS: 325.0000 mg | ORAL_TABLET | ORAL | Status: DC | PRN

## 2021-12-14 MED ORDER — HYDROCODONE-ACETAMINOPHEN 10-325 MG PO TABS: 1.0000 | ORAL_TABLET | Freq: Every day | ORAL | Status: DC | PRN

## 2021-12-14 MED ORDER — FENTANYL CITRATE (PF) 100 MCG/2ML IJ SOLN
INTRAMUSCULAR | Status: DC | PRN
  Administered 2021-12-14: 25 ug via INTRAVENOUS
  Administered 2021-12-14 (×2): 12.5 ug via INTRAVENOUS
  Administered 2021-12-14 (×3): 25 ug via INTRAVENOUS
  Administered 2021-12-14 (×3): 12.5 ug via INTRAVENOUS

## 2021-12-14 SURGICAL SUPPLY — 20 items
CATH EZ STEER NAV 4MM D-F CUR (ABLATOR) IMPLANT
CATH JOSEPH QUAD ALLRED 6F REP (CATHETERS) IMPLANT
CATH POLARIS X REPROCESSED (CATHETERS) IMPLANT
CATH RIGHTSITE C315HIS02 (CATHETERS) IMPLANT
INTRODUCER SWARTZ SRO 8F (SHEATH) IMPLANT
IPG PACE AZUR XT DR MRI W1DR01 (Pacemaker) IMPLANT
LEAD CAPSURE NOVUS 5076-52CM (Lead) IMPLANT
LEAD SELECT SECURE 3830 383069 (Lead) IMPLANT
PACE AZURE XT DR MRI W1DR01 (Pacemaker) ×1 IMPLANT
PACK EP LATEX FREE (CUSTOM PROCEDURE TRAY) ×1
PACK EP LF (CUSTOM PROCEDURE TRAY) ×1 IMPLANT
PAD DEFIB RADIO PHYSIO CONN (PAD) ×1 IMPLANT
PATCH CARTO3 (PAD) IMPLANT
SELECT SECURE 3830 383069 (Lead) ×1 IMPLANT
SHEATH 7FR PRELUDE SNAP 13 (SHEATH) IMPLANT
SHEATH PINNACLE 6F 10CM (SHEATH) IMPLANT
SHEATH PINNACLE 7F 10CM (SHEATH) IMPLANT
SHEATH PINNACLE 8F 10CM (SHEATH) IMPLANT
SLITTER 6232ADJ (MISCELLANEOUS) IMPLANT
WIRE HI TORQ VERSACORE-J 145CM (WIRE) IMPLANT

## 2021-12-14 NOTE — H&P (Signed)
HPI Carol Richmond is referred by Dr. Domenic Polite for evaluation of SVT. She is a pleasant 71 yo woman with a h/o fatty liver disease, HTN, and obesity who has developed a wide QRS tachycardia. She has baseline LBBB and her echo demonstrates that she has had a reduction in her EF from 55% to 35-40%. She has class 2 symptoms. She has not had syncope and she has only mild dyspnea.  No Known Allergies           Current Outpatient Medications  Medication Sig Dispense Refill   acetaminophen (TYLENOL) 500 MG tablet Take 500 mg by mouth every 6 (six) hours as needed for mild pain.       amiodarone (PACERONE) 200 MG tablet Take 1 tablet (200 mg total) by mouth daily. 30 tablet 6   aspirin EC 81 MG tablet Take 1 tablet (81 mg total) by mouth daily.       carvedilol (COREG) 3.125 MG tablet Take 1 tablet (3.125 mg total) by mouth 2 (two) times daily. 180 tablet 1   cholecalciferol (VITAMIN D) 1000 UNITS tablet Take 1,000 Units by mouth daily.       diclofenac Sodium (VOLTAREN) 1 % GEL Apply 4 g topically 4 (four) times daily. 350 g 1   DULoxetine (CYMBALTA) 60 MG capsule TAKE 1 CAPSULE BY MOUTH ONCE DAILY. 30 capsule 2   empagliflozin (JARDIANCE) 10 MG TABS tablet Take 1 tablet (10 mg total) by mouth daily before breakfast. 90 tablet 3   fluticasone (FLONASE) 50 MCG/ACT nasal spray Place 2 sprays into both nostrils as needed for allergies or rhinitis.       furosemide (LASIX) 40 MG tablet Take 1 tablet (40 mg total) by mouth daily. 90 tablet 1   gabapentin (NEURONTIN) 300 MG capsule Take 300 mg by mouth 3 (three) times daily as needed (pain).       lactulose (CHRONULAC) 10 GM/15ML solution TAKE 30 MLS (20 GRAM TOTAL) BY MOUTH 3 TIMES A DAY. 2700 mL 5   lamoTRIgine (LAMICTAL) 200 MG tablet Take 1 tablet (200 mg total) by mouth 2 (two) times daily. 180 tablet 3   levocetirizine (XYZAL) 5 MG tablet TAKE 1 TABLET EVERY EVENING. 30 tablet 5   LORazepam (ATIVAN) 0.5 MG tablet Take 1 tablet (0.5 mg  total) by mouth 2 (two) times daily as needed for anxiety. 30 tablet 5   meloxicam (MOBIC) 15 MG tablet Take 1 tablet (15 mg total) by mouth daily. 30 tablet 0   naloxone (NARCAN) nasal spray 4 mg/0.1 mL Place into the nose once.       ondansetron (ZOFRAN) 4 MG tablet Take 1 tablet (4 mg total) by mouth every 8 (eight) hours as needed for nausea or vomiting. 20 tablet 0   pantoprazole (PROTONIX) 40 MG tablet Take 40 mg by mouth 2 (two) times daily.       potassium chloride SA (KLOR-CON M) 20 MEQ tablet Take 2 tablets (40 mEq total) by mouth daily. 90 tablet 3   sacubitril-valsartan (ENTRESTO) 24-26 MG Take 1 tablet by mouth 2 (two) times daily. 180 tablet 3    No current facility-administered medications for this visit.            Past Medical History:  Diagnosis Date   Asthma     Carotid artery disease ([place])     Diverticulosis of colon (without mention of hemorrhage)     Essential hypertension  Functional ovarian cysts     GERD (gastroesophageal reflux disease)     Hemorrhoids, external, without mention of complication     Hormone replacement therapy (postmenopausal)     IBS (irritable bowel syndrome)     Migraine     Mitral valve disease     Right knee DJD     Seizures ([facility])     Sleep apnea     TIA (transient ischemic attack)     Type 2 diabetes mellitus ([place])        ROS:    All systems reviewed and negative except as noted in the HPI.          Past Surgical History:  Procedure Laterality Date   BIOPSY   09/23/2018    Procedure: BIOPSY;  Surgeon: Rogene Houston, MD;  [facility];  Service: Endoscopy;;  duodeneum and stomach   CHOLECYSTECTOMY   1980   ENDARTERECTOMY Right 07/01/2017    Procedure: ENDARTERECTOMY CAROTID RIGHT;  Surgeon: Serafina Mitchell, MD;  Location: [facility] OR;  Service: Vascular;  Laterality: Right;   ESOPHAGOGASTRODUODENOSCOPY N/A 09/23/2018    Procedure: ESOPHAGOGASTRODUODENOSCOPY (EGD);  Surgeon: Rogene Houston, MD;  [facility];  Service: Endoscopy;  Laterality: N/A;  2:45-moved to 1:45 per Lelon Frohlich   ESOPHAGOGASTRODUODENOSCOPY (EGD) WITH PROPOFOL N/A 03/07/2021    Procedure: ESOPHAGOGASTRODUODENOSCOPY (EGD) WITH PROPOFOL;  Surgeon: Rogene Houston, MD;  [facility];  Service: Endoscopy;  Laterality: N/A;  12:50   LEFT HEART CATHETERIZATION WITH CORONARY ANGIOGRAM N/A 11/18/2013    Procedure: LEFT HEART CATHETERIZATION WITH CORONARY ANGIOGRAM;  Surgeon: Burnell Blanks, MD;  Location: [facility] CATH LAB;  Service: Cardiovascular;  Laterality: N/A;   OOPHORECTOMY        2012 Dr. Adah Perl in [place] Right 07/01/2017    Procedure: RIGHT CAROTID PATCH ANGIOPLASTY;  Surgeon: Serafina Mitchell, MD;  Location: [place];  Service: Vascular;  Laterality: Right;   TONSILLECTOMY       TOTAL KNEE ARTHROPLASTY Right 09/02/2012    Procedure: TOTAL KNEE ARTHROPLASTY- right;  Surgeon: Ninetta Lights, MD;  Location: [place];  Service: Orthopedics;  Laterality: Right;   TUBAL LIGATION       TYMPANOMASTOIDECTOMY Left 04/02/2016    Procedure: LEFT CANAL WALL DOWN TYMPANOMASTOIDECTOMY;  Surgeon: Leta Baptist, MD;  Location: [place];  Service: ENT;  Laterality: Left;   VAGINAL HYSTERECTOMY   1990   WOUND EXPLORATION Right 07/02/2017    Procedure: RIGHT NECK EXPLORATION WITH INTRAOPERATIVE ULTRASOUND;  Surgeon: Angelia Mould, MD;  Location: [facility] OR;  Service: Vascular;  Laterality: Right;             Family History  Problem Relation Age of Onset   Heart disease Mother     Diabetes Mother     Heart disease Brother     Cancer Sister     Heart disease Brother     Heart disease Brother     Heart disease Son          Social History         Socioeconomic History   Marital status: Widowed      Spouse name: Not on file   Number of children: 3   Years of education: Not on file   Highest education level: Not on file  Occupational History   Occupation: retired  Tobacco Use   Smoking  status: Never   Smokeless tobacco: Never  Vaping  Use   Vaping Use: Never used  Substance and Sexual Activity   Alcohol use: No      Alcohol/week: 0.0 standard drinks of alcohol   Drug use: No   Sexual activity: Not Currently  Other Topics Concern   Not on file  Social History Narrative    Lives alone. Granddaughter lives across the street, her 3 children all live nearby    Social Determinants of Health        Financial Resource Strain: Low Risk  (10/02/2021)    Overall Financial Resource Strain (CARDIA)     Difficulty of Paying Living Expenses: Not very hard  Recent Concern: Financial Resource Strain - Medium Risk (07/11/2021)    Overall Financial Resource Strain (CARDIA)     Difficulty of Paying Living Expenses: Somewhat hard  Food Insecurity: Food Insecurity Present (07/05/2021)    Hunger Vital Sign     Worried About Running Out of Food in the Last Year: Sometimes true     Ran Out of Food in the Last Year: Never true  Transportation Needs: No Transportation Needs (10/02/2021)    PRAPARE - Armed forces logistics/support/administrative officer (Medical): No     Lack of Transportation (Non-Medical): No  Physical Activity: Insufficiently Active (07/05/2021)    Exercise Vital Sign     Days of Exercise per Week: 7 days     Minutes of Exercise per Session: 20 min  Stress: No Stress Concern Present (07/05/2021)    [place]     Feeling of Stress : Not at all  Social Connections: Moderately Integrated (07/05/2021)    Social Connection and Isolation Panel [NHANES]     Frequency of Communication with Friends and Family: More than three times a week     Frequency of Social Gatherings with Friends and Family: More than three times a week     Attends Religious Services: More than 4 times per year     Active Member of [employer] or Organizations: Yes     Attends Archivist Meetings: More than 4 times per year     Marital Status:  Widowed  Intimate Partner Violence: Not At Risk (07/05/2021)    Humiliation, Afraid, Rape, and Kick questionnaire     Fear of Current or Ex-Partner: No     Emotionally Abused: No     Physically Abused: No     Sexually Abused: No        BP 128/80   Pulse 84   Ht 5' 4"  (1.626 m)   Wt 175 lb (79.4 kg)   SpO2 97%   BMI 30.04 kg/m    Physical Exam:   Well appearing NAD HEENT: Unremarkable Neck:  No JVD, no thyromegally Lymphatics:  No adenopathy Back:  No CVA tenderness Lungs:  Clear with no wheezes HEART:  Regular rate rhythm, no murmurs, no rubs, no clicks Abd:  soft, positive bowel sounds, no organomegally, no rebound, no guarding Ext:  2 plus pulses, no edema, no cyanosis, no clubbing Skin:  No rashes no nodules Neuro:  CN II through XII intact, motor grossly intact   EKG - reviewed. NSR with LBBB     Assess/Plan:  Wide QRS tachy - I have discussed the treatment options with the patient and recommended proceeding with EP study and catheter ablation. She will call us if she wishes to proceed. Chronic systolic heart failure - she is class 1. Hopefully her EF will improve with  medical therapy. LBBB - no h/o syncope. We will follow.   Carleene Overlie Neidra Girvan,MD

## 2021-12-14 NOTE — Plan of Care (Signed)
  Problem: Education: Goal: Knowledge of General Education information will improve Description: Including pain rating scale, medication(s)/side effects and non-pharmacologic comfort measures Outcome: Progressing   Problem: Health Behavior/Discharge Planning: Goal: Ability to manage health-related needs will improve Outcome: Progressing   Problem: Clinical Measurements: Goal: Ability to maintain clinical measurements within normal limits will improve Outcome: Progressing Goal: Will remain free from infection Outcome: Progressing Goal: Diagnostic test results will improve Outcome: Progressing Goal: Respiratory complications will improve Outcome: Progressing Goal: Cardiovascular complication will be avoided Outcome: Progressing   Problem: Activity: Goal: Risk for activity intolerance will decrease Outcome: Progressing   Problem: Nutrition: Goal: Adequate nutrition will be maintained Outcome: Progressing   Problem: Coping: Goal: Level of anxiety will decrease Outcome: Progressing   Problem: Elimination: Goal: Will not experience complications related to bowel motility Outcome: Progressing Goal: Will not experience complications related to urinary retention Outcome: Progressing   Problem: Pain Managment: Goal: General experience of comfort will improve Outcome: Progressing   Problem: Safety: Goal: Ability to remain free from injury will improve Outcome: Progressing   Problem: Skin Integrity: Goal: Risk for impaired skin integrity will decrease Outcome: Progressing   Problem: Education: Goal: Knowledge of cardiac device and self-care will improve Outcome: Progressing Goal: Ability to safely manage health related needs after discharge will improve Outcome: Progressing Goal: Individualized Educational Video(s) Outcome: Progressing   Problem: Cardiac: Goal: Ability to achieve and maintain adequate cardiopulmonary perfusion will improve Outcome: Progressing    Problem: Cardiac: Goal: Ability to achieve and maintain adequate cardiopulmonary perfusion will improve Outcome: Progressing

## 2021-12-14 NOTE — Discharge Instructions (Addendum)
Cardiac Ablation, Care After  This sheet gives you information about how to care for yourself after your procedure. Your health care provider may also give you more specific instructions. If you have problems or questions, contact your health care provider. What can I expect after the procedure? After the procedure, it is common to have: Bruising around your puncture site. Tenderness around your puncture site. Skipped heartbeats. Tiredness (fatigue).  Follow these instructions at home: Puncture site care  Follow instructions from your health care provider about how to take care of your puncture site. Make sure you: If present, leave stitches (sutures), skin glue, or adhesive strips in place. These skin closures may need to stay in place for up to 2 weeks. If adhesive strip edges start to loosen and curl up, you may trim the loose edges. Do not remove adhesive strips completely unless your health care provider tells you to do that. If a large square bandage is present, this may be removed 24 hours after surgery.  Check your puncture site every day for signs of infection. Check for: Redness, swelling, or pain. Fluid or blood. If your puncture site starts to bleed, lie down on your back, apply firm pressure to the area, and contact your health care provider. Warmth. Pus or a bad smell. A pea or small marble sized lump at the site is normal and can take up to three months to resolve.  Driving Do not drive for at least 4 days after your procedure or however long your health care provider recommends. (Do not resume driving if you have previously been instructed not to drive for other health reasons.) Do not drive or use heavy machinery while taking prescription pain medicine. Activity Avoid activities that take a lot of effort for at least 7 days after your procedure. Do not lift anything that is heavier than 5 lb (4.5 kg) for one week.  No sexual activity for 1 week.  Return to your normal  activities as told by your health care provider. Ask your health care provider what activities are safe for you. General instructions Take over-the-counter and prescription medicines only as told by your health care provider. Do not use any products that contain nicotine or tobacco, such as cigarettes and e-cigarettes. If you need help quitting, ask your health care provider. You may shower after 24 hours, but Do not take baths, swim, or use a hot tub for 1 week.  Do not drink alcohol for 24 hours after your procedure. Keep all follow-up visits as told by your health care provider. This is important. Contact a health care provider if: You have redness, mild swelling, or pain around your puncture site. You have fluid or blood coming from your puncture site that stops after applying firm pressure to the area. Your puncture site feels warm to the touch. You have pus or a bad smell coming from your puncture site. You have a fever. You have chest pain or discomfort that spreads to your neck, jaw, or arm. You are sweating a lot. You feel nauseous. You have a fast or irregular heartbeat. You have shortness of breath. You are dizzy or light-headed and feel the need to lie down. You have pain or numbness in the arm or leg closest to your puncture site. Get help right away if: Your puncture site suddenly swells. Your puncture site is bleeding and the bleeding does not stop after applying firm pressure to the area. These symptoms may represent a serious problem that is  an emergency. Do not wait to see if the symptoms will go away. Get medical help right away. Call your local emergency services (911 in the [place] Do not drive yourself to the hospital. Summary After the procedure, it is normal to have bruising and tenderness at the puncture site in your groin, neck, or forearm. Check your puncture site every day for signs of infection. Get help right away if your puncture site is bleeding and the  bleeding does not stop after applying firm pressure to the area. This is a medical emergency. This information is not intended to replace advice given to you by your health care provider. Make sure you discuss any questions you have with your health care provider.   After Your Pacemaker   You have a Medtronic Pacemaker  ACTIVITY Do not lift your arm above shoulder height for 1 week after your procedure. After 7 days, you may progress as below.  You should remove your sling 24 hours after your procedure, unless otherwise instructed by your provider.     Friday December 21, 2021  Saturday December 22, 2021 Sunday December 23, 2021 Monday December 24, 2021   Do not lift, push, pull, or carry anything over 10 pounds with the affected arm until 6 weeks (Friday January 25, 2022 ) after your procedure.   You may drive AFTER your wound check, unless you have been told otherwise by your provider.   Ask your healthcare provider when you can go back to work   INCISION/Dressing If large square, outer bandage is left in place, this can be removed after 24 hours from your procedure. Do not remove steri-strips or glue as below.   Monitor your Pacemaker site for redness, swelling, and drainage. Call the device clinic at [phone] if you experience these symptoms or fever/chills.  If your incision is sealed with Steri-strips or staples, you may shower 7 days after your procedure or when told by your provider. Do not remove the steri-strips or let the shower hit directly on your site. You may wash around your site with soap and water.    If you were discharged in a sling, please do not wear this during the day more than 48 hours after your surgery unless otherwise instructed. This may increase the risk of stiffness and soreness in your shoulder.   Avoid lotions, ointments, or perfumes over your incision until it is well-healed.  You may use a hot tub or a pool AFTER your wound check appointment if the  incision is completely closed.  Pacemaker Alerts:  Some alerts are vibratory and others beep. These are NOT emergencies. Please call our office to let us know. If this occurs at night or on weekends, it can wait until the next business day. Send a remote transmission.  If your device is capable of reading fluid status (for heart failure), you will be offered monthly monitoring to review this with you.   DEVICE MANAGEMENT Remote monitoring is used to monitor your pacemaker from home. This monitoring is scheduled every 91 days by our office. It allows [place] to keep an eye on the functioning of your device to ensure it is working properly. You will routinely see your Electrophysiologist annually (more often if necessary).   You should receive your ID card for your new device in 4-8 weeks. Keep this card with you at all times once received. Consider wearing a medical alert bracelet or necklace.  Your Pacemaker may be MRI compatible. This will be discussed  at your next office visit/wound check.  You should avoid contact with strong electric or magnetic fields.   Do not use amateur (ham) radio equipment or electric (arc) welding torches. MP3 player headphones with magnets should not be used. Some devices are safe to use if held at least 12 inches (30 cm) from your Pacemaker. These include power tools, lawn mowers, and speakers. If you are unsure if something is safe to use, ask your health care provider.  When using your cell phone, hold it to the ear that is on the opposite side from the Pacemaker. Do not leave your cell phone in a pocket over the Pacemaker.  You may safely use electric blankets, heating pads, computers, and microwave ovens.  Call the office right away if: You have chest pain. You feel more short of breath than you have felt before. You feel more light-headed than you have felt before. Your incision starts to open up.  This information is not intended to replace advice given to you  by your health care provider. Make sure you discuss any questions you have with your health care provider.

## 2021-12-15 ENCOUNTER — Ambulatory Visit (HOSPITAL_COMMUNITY): Payer: Medicare HMO

## 2021-12-15 DIAGNOSIS — I052 Rheumatic mitral stenosis with insufficiency: Secondary | ICD-10-CM | POA: Diagnosis not present

## 2021-12-15 DIAGNOSIS — Z950 Presence of cardiac pacemaker: Secondary | ICD-10-CM | POA: Diagnosis not present

## 2021-12-15 DIAGNOSIS — K219 Gastro-esophageal reflux disease without esophagitis: Secondary | ICD-10-CM | POA: Diagnosis not present

## 2021-12-15 DIAGNOSIS — I447 Left bundle-branch block, unspecified: Secondary | ICD-10-CM

## 2021-12-15 DIAGNOSIS — I5022 Chronic systolic (congestive) heart failure: Secondary | ICD-10-CM | POA: Diagnosis not present

## 2021-12-15 DIAGNOSIS — E119 Type 2 diabetes mellitus without complications: Secondary | ICD-10-CM | POA: Diagnosis not present

## 2021-12-15 DIAGNOSIS — I442 Atrioventricular block, complete: Secondary | ICD-10-CM

## 2021-12-15 DIAGNOSIS — I6529 Occlusion and stenosis of unspecified carotid artery: Secondary | ICD-10-CM | POA: Diagnosis not present

## 2021-12-15 DIAGNOSIS — I471 Supraventricular tachycardia, unspecified: Secondary | ICD-10-CM | POA: Diagnosis not present

## 2021-12-15 DIAGNOSIS — I110 Hypertensive heart disease with heart failure: Secondary | ICD-10-CM | POA: Diagnosis not present

## 2021-12-15 DIAGNOSIS — I2510 Atherosclerotic heart disease of native coronary artery without angina pectoris: Secondary | ICD-10-CM | POA: Diagnosis not present

## 2021-12-15 NOTE — Progress Notes (Signed)
Progress Note  Patient Name: Carol Richmond Date of Encounter: 12/15/2021  Primary Cardiologist: Rozann Lesches, MD   Subjective   No chest pain or sob.   Inpatient Medications    Scheduled Meds:  carvedilol  3.125 mg Oral BID   lactulose  30 g Oral Daily   lamoTRIgine  200 mg Oral BID   loratadine  5 mg Oral QPM   pantoprazole  20 mg Oral Daily   sacubitril-valsartan  1 tablet Oral BID   Continuous Infusions:  PRN Meds: acetaminophen, gabapentin, HYDROcodone-acetaminophen, LORazepam, ondansetron (ZOFRAN) IV, ondansetron   Vital Signs    Vitals:   12/14/21 1939 12/15/21 0049 12/15/21 0400 12/15/21 0745  BP: 133/77 108/65 116/70 124/69  Pulse: 82  77 87  Resp: 15  15 15   Temp: 98.1 F (36.7 C) 98.6 F (37 C) 97.8 F (36.6 C) 97.8 F (36.6 C)  TempSrc: Oral Oral Oral Oral  SpO2: 98%  96% 99%  Weight:      Height:        Intake/Output Summary (Last 24 hours) at 12/15/2021 0859 Last data filed at 12/15/2021 0500 Gross per 24 hour  Intake 150 ml  Output --  Net 150 ml   Filed Weights   12/14/21 0608  Weight: 78 kg    Telemetry    nsr - Personally Reviewed  ECG    Nsr with LBBB - Personally Reviewed  Physical Exam   GEN: No acute distress.   Neck: No JVD Cardiac: RRR, no murmurs, rubs, or gallops.  Respiratory: Clear to auscultation bilaterally. No hematoma GI: Soft, nontender, non-distended  MS: No edema; No deformity. Neuro:  Nonfocal  Psych: Normal affect   Labs    ChemistryNo results for input(s): "NA", "K", "CL", "CO2", "GLUCOSE", "BUN", "CREATININE", "CALCIUM", "PROT", "ALBUMIN", "AST", "ALT", "ALKPHOS", "BILITOT", "GFRNONAA", "GFRAA", "ANIONGAP" in the last 168 hours.   Hematology Recent Labs  Lab 12/14/21 0600  WBC 7.7  RBC 4.61  HGB 13.3  HCT 41.3  MCV 89.6  MCH 28.9  MCHC 32.2  RDW 14.7  PLT 163    Cardiac EnzymesNo results for input(s): "TROPONINI" in the last 168 hours. No results for input(s): "TROPIPOC" in  the last 168 hours.   BNPNo results for input(s): "BNP", "PROBNP" in the last 168 hours.   DDimer No results for input(s): "DDIMER" in the last 168 hours.   Radiology    DG Chest 2 View  Result Date: 12/15/2021 CLINICAL DATA:  Status post pacemaker placement EXAM: CHEST - 2 VIEW COMPARISON:  September 28, 2021 FINDINGS: Evaluation of the lung bases is limited as the patient's hand and lower arm overlie the lower chest. Within this limitation, no discrete infiltrate identified. Patient is status post pacemaker placement in the interval with leads overlying the right atrium and right ventricle. No pneumothorax identified. The cardiomediastinal silhouette is stable. IMPRESSION: 1. Interval placement of pacemaker with leads overlying the right atrium and right ventricle. No pneumothorax identified. 2. No other changes. Electronically Signed   By: Dorise Bullion III M.D.   On: 12/15/2021 08:20   EP STUDY  Result Date: 12/14/2021 Conclusion: Successful EP study and catheter ablation of AV node reentrant tachycardia with the procedure complicated by transient complete heart block with no ventricular escape for 30 seconds resulting in the decision to implant a Medtronic dual-chamber pacemaker which was carried out without initial complication. Cristopher Peru, MD   EP PPM/ICD IMPLANT  Result Date: 12/14/2021 Conclusion: Successful EP study and catheter  ablation of AV node reentrant tachycardia with the procedure complicated by transient complete heart block with no ventricular escape for 30 seconds resulting in the decision to implant a Medtronic dual-chamber pacemaker which was carried out without initial complication. Cristopher Peru, MD    Cardiac Studies   none  Patient Profile     71 y.o. female admitted with SVT and LBBB and underwent ablation with transient heart block s/p PPM insertion.  Assessment & Plan    SVT - she is s/p ablation of AVNRT. She is doing well. No arrhythmias Transient CHB  - she is s/p PPM. She is conducting on her own. PPM - her interrogation demonstrates normal DDD PM function. Usual followup.  For questions or updates, please contact [place] Please consult [email or web address] for contact info under Cardiology/STEMI.      Signed, Cristopher Peru, MD  12/15/2021, 8:59 AM

## 2021-12-15 NOTE — Discharge Summary (Addendum)
Discharge Summary    [name] MRN: [id number]; DOB: 03-14-1950  Admit date: 12/14/2021 Discharge date: 12/15/2021  PCP:  Gwenlyn Perking, [place] Providers Cardiologist:  Rozann Lesches, MD        Discharge Diagnoses    Principal Problem:   SVT (supraventricular tachycardia) Active Problems:   Chronic HFrEF (heart failure with reduced ejection fraction) ([place])   Heart block AV complete ([place])   LBBB (left bundle branch block)    Diagnostic Studies/Procedures    SVT ablation/MDT PPM implantation 12/14/21 - See EMR for full procedural report Conclusion: Successful EP study and catheter ablation of AV node reentrant tachycardia with the procedure complicated by transient complete heart block with no ventricular escape for 30 seconds resulting in the decision to implant a Medtronic dual-chamber pacemaker which was carried out without initial complication. Cristopher Peru, MD _____________   History of Present Illness     Carol Richmond is a 71 y.o. female with PSVT, HFrEF (EF previously reduced with cath in 2015 showing mild nonobstructive CAD, EF at 55-60% by echo in 01/2021, further reduced at 35-40% in 09/2021), carotid artery stenosis (s/p R CEA in 2019), chronic LBBB, mitral stenosis, Type 2 DM, HTN and GERD who presented to the hospital for SVT ablation.   She was examined by Dr. Domenic Polite in 09/2021 following a recent admission for SVT and had required Amiodarone for suppression. Echocardiogram during admission showed her EF was reduced at 35 to 40% and she was noted to have moderate to severe mitral valve regurgitation and moderate to severe mitral stenosis. It was unclear if her cardiomyopathy was due to tachycardia or progressive mitral valve disease, therefore was recommend to optimize GDMT and Coreg 3.125 mg twice daily was added to her medication regimen along with her being continued on Entresto, Lasix and Jardiance. Was  recommended to consider the addition of Spironolactone at her follow-up visit and ultimately would likely need follow-up with CT Surgery for consideration of MVR (scheduled for repeat echo in 12/2021. She saw Dr. Lovena Le in follow-up who recommended catheter ablation for SVT. She was subsequently brought in for this procedure 12/14/21.  Hospital Course    On 12/14/21 she udnerwent successful EP study and catheter ablation of AV node reentrant tachycardia with the procedure complicated by transient complete heart block with no ventricular escape for 30 seconds resulting in the decision to implant a Medtronic dual-chamber pacemaker which was carried out without initial complication. F/u CXR this AM showed no pneumothorax. Dr. Lovena Le saw the patient, examined the data, and reported that she is stable to be discharged today.  EP APP has outlined comprehensive discharge instructions on AVS. They were reviewed with patient. Follow-up has been scheduled. I did make sure pt aware that wound check is in [place] location. Remainder of medical issues felt to be stable by Dr. Lovena Le who did not advise any medicine changes at this time.    Did the patient have an acute coronary syndrome (MI, NSTEMI, STEMI, etc) this admission?:  No                               Did the patient have a percutaneous coronary intervention (stent / angioplasty)?:  No.    _____________  Discharge Vitals Blood pressure 124/69, pulse 87, temperature 97.8 F (36.6 C), temperature source Oral, resp. rate 15, height _0  (1.626 m), weight 78 kg, SpO2  99 %.  Filed Weights   12/14/21 0608  Weight: 78 kg    Labs & Radiologic Studies    CBC Recent Labs    12/14/21 0600  WBC 7.7  HGB 13.3  HCT 41.3  MCV 89.6  PLT 163   _____________  DG Chest 2 View  Result Date: 12/15/2021 CLINICAL DATA:  Status post pacemaker placement EXAM: CHEST - 2 VIEW COMPARISON:  September 28, 2021 FINDINGS: Evaluation of the lung bases is limited as the  patient's hand and lower arm overlie the lower chest. Within this limitation, no discrete infiltrate identified. Patient is status post pacemaker placement in the interval with leads overlying the right atrium and right ventricle. No pneumothorax identified. The cardiomediastinal silhouette is stable. IMPRESSION: 1. Interval placement of pacemaker with leads overlying the right atrium and right ventricle. No pneumothorax identified. 2. No other changes. Electronically Signed   By: Dorise Bullion III M.D.   On: 12/15/2021 08:20   EP STUDY  Result Date: 12/14/2021 Conclusion: Successful EP study and catheter ablation of AV node reentrant tachycardia with the procedure complicated by transient complete heart block with no ventricular escape for 30 seconds resulting in the decision to implant a Medtronic dual-chamber pacemaker which was carried out without initial complication. Cristopher Peru, MD   EP PPM/ICD IMPLANT  Result Date: 12/14/2021 Conclusion: Successful EP study and catheter ablation of AV node reentrant tachycardia with the procedure complicated by transient complete heart block with no ventricular escape for 30 seconds resulting in the decision to implant a Medtronic dual-chamber pacemaker which was carried out without initial complication. Cristopher Peru, MD   Disposition   Pt is being discharged home today in good condition.  Follow-up Plans & Appointments     Follow-up [facility]. [facility] Follow up.   Specialty: Cardiology Why: Please come to the [facility] in [place] (listed here) for your wound check visit with our device clinic nurse on Wednesday Dec 26, 2021 at 4:00 PM. Arrive 10-15 minutes early to check in. Otherwise, keep appointments as outlined below with echocardiogram in [facility] on 01/16/22, office visit with Dr. Domenic Polite in [place] on 01/21/22, and follow-up with Dr. Lovena Le in [place]  on 03/21/22. Contact information: [address], [place] [id number] [place] [phone]               Discharge Instructions     Diet - low sodium heart healthy   Complete by: As directed    Discharge instructions   Complete by: As directed    Your home medicine list included two medicines that both contain Tylenol - acetaminophen and hydrocodone/acetaminophen. Just be aware of the content of acetaminophen when taking these medicines as you should not take more than 1078m of acetaminophen every 6 hours, and no more than 40032mtotal in a day. Please make sure to discuss these medicines with the doctor that manages your liver disease.   Increase activity slowly   Complete by: As directed    Please see attached sheet at the end of your After-Visit Summary for instructions on wound care, activity, and bathing.        Discharge Medications   Allergies as of 12/15/2021   No Known Allergies      Medication List     TAKE these medications    Accu-Chek Guide test strip Generic drug: glucose blood Use as  instructed to test blood sugar daily as directed. DX:E11.9   Accu-Chek Softclix Lancets lancets check blood sugars twice daily DX E11.65   acetaminophen 500 MG tablet Commonly known as: TYLENOL Take 500 mg by mouth every 6 (six) hours as needed for mild pain.   aspirin EC 81 MG tablet Take 1 tablet (81 mg total) by mouth daily.   benzonatate 200 MG capsule Commonly known as: TESSALON Take 1 capsule (200 mg total) by mouth 3 (three) times daily as needed.   carvedilol 3.125 MG tablet Commonly known as: COREG Take 1 tablet (3.125 mg total) by mouth 2 (two) times daily.   cholecalciferol 1000 units tablet Commonly known as: VITAMIN D Take 1,000 Units by mouth daily.   empagliflozin 10 MG Tabs tablet Commonly known as: Jardiance Take 1 tablet (10 mg total) by mouth daily before breakfast.   Entresto 24-26 MG Generic  drug: sacubitril-valsartan Take 1 tablet by mouth 2 (two) times daily.   fluticasone 50 MCG/ACT nasal spray Commonly known as: FLONASE Place 2 sprays into both nostrils as needed for allergies or rhinitis.   furosemide 40 MG tablet Commonly known as: LASIX Take 1 tablet (40 mg total) by mouth daily.   gabapentin 300 MG capsule Commonly known as: NEURONTIN Take 300 mg by mouth 3 (three) times daily as needed (Nerve in throat).   HYDROcodone-acetaminophen 10-325 MG tablet Commonly known as: NORCO Take 1 tablet by mouth every 6 (six) hours as needed for severe pain (Arthritis on neck).   lactulose 10 GM/15ML solution Commonly known as: CHRONULAC TAKE 30 MLS (20 GRAM TOTAL) BY MOUTH 3 TIMES A DAY.   lamoTRIgine 200 MG tablet Commonly known as: LAMICTAL Take 1 tablet (200 mg total) by mouth 2 (two) times daily.   levocetirizine 5 MG tablet Commonly known as: XYZAL Take 1 tablet (5 mg total) by mouth every evening.   LORazepam 0.5 MG tablet Commonly known as: ATIVAN Take 1 tablet (0.5 mg total) by mouth 2 (two) times daily as needed for anxiety.   multivitamin with minerals tablet Take 1 tablet by mouth daily.   naloxone 4 MG/0.1ML Liqd nasal spray kit Commonly known as: NARCAN Place 1 spray into the nose once. Take by nasal route every 3 minutes until patient awakes or EMS arrives.   ondansetron 4 MG tablet Commonly known as: Zofran Take 1 tablet (4 mg total) by mouth every 8 (eight) hours as needed for nausea or vomiting.   pantoprazole 40 MG tablet Commonly known as: PROTONIX TAKE (1) TABLET TWICE DAILY.   potassium chloride SA 20 MEQ tablet Commonly known as: KLOR-CON M Take 2 tablets (40 mEq total) by mouth daily.           Outstanding Labs/Studies   N/A  Duration of Discharge Encounter   Greater than 30 minutes including physician time.  Signed, Charlie Pitter, PA-C 12/15/2021, 10:36 AM  [place] Spotted

## 2021-12-17 MED FILL — Cefazolin Sodium-Dextrose IV Solution 2 GM/100ML-4%: INTRAVENOUS | Qty: 100 | Status: AC

## 2021-12-17 MED FILL — Lidocaine HCl Local Inj 1%: INTRAMUSCULAR | Qty: 50 | Status: AC

## 2021-12-18 DIAGNOSIS — H9072 Mixed conductive and sensorineural hearing loss, unilateral, left ear, with unrestricted hearing on the contralateral side: Secondary | ICD-10-CM | POA: Diagnosis not present

## 2021-12-18 DIAGNOSIS — H95122 Granulation of postmastoidectomy cavity, left ear: Secondary | ICD-10-CM | POA: Diagnosis not present

## 2021-12-18 DIAGNOSIS — R1312 Dysphagia, oropharyngeal phase: Secondary | ICD-10-CM | POA: Diagnosis not present

## 2021-12-18 DIAGNOSIS — R070 Pain in throat: Secondary | ICD-10-CM | POA: Diagnosis not present

## 2021-12-19 ENCOUNTER — Telehealth: Payer: Self-pay | Admitting: Internal Medicine

## 2021-12-19 NOTE — Telephone Encounter (Signed)
Advised patient of location for device clinic apt. Also advised patient her remote monitor should be next to her bed where she sleeps plugged in within 6 feet of her. Patient voiced understanding and appreciative of call.

## 2021-12-19 NOTE — Telephone Encounter (Signed)
Patient was calling with questions bout her pace maker. Please advise

## 2021-12-20 ENCOUNTER — Ambulatory Visit (INDEPENDENT_AMBULATORY_CARE_PROVIDER_SITE_OTHER): Payer: Medicare HMO | Admitting: Pharmacist

## 2021-12-20 DIAGNOSIS — I10 Essential (primary) hypertension: Secondary | ICD-10-CM

## 2021-12-20 DIAGNOSIS — E1165 Type 2 diabetes mellitus with hyperglycemia: Secondary | ICD-10-CM

## 2021-12-20 NOTE — Progress Notes (Signed)
Disease State Management Pharmacy Note  12/20/2021 Name:  Carol Richmond MRN:  [id number] DOB:  May 11, 1950  Summary:  Diabetes: New goal. Uncontrolled A1c>7%; current treatment:JARDIANCE 10MG;  Patient reports increased FBG up to [place] to 28m daily Will have patient double up and take (2) of her 131mtablets to use her current supply [employer] jardiance free med assistance May consider GLP1 therapy for CV benefit and patient requesting for weight management Current glucose readings: fasting glucose: 150-200, post prandial glucose: n/a Denies hypoglycemic/hyperglycemic symptoms Recommended increase in jardiance, encouraged compliance with diet/lifestyle  Hyperlipidemia:  New goal. Uncontrolled-LDL>100 (GOAL <70); current treatment:n/a;  Patient states statin was stopped by hospital and never resumed Messaged PCP re: restart of statin given LDL>100 and cardiace history Reivewed chart notes and do not see contraindicated--mess sent to PCP re: restart low dose statin and work up Medications previously tried: rosuvastatin  Current dietary patterns: encouraged heart healthy/mediterranean diet Educated on diet/lifestyle, diabetes/HLD Recommended restart statin  Patient Goals/Self-Care Activities patient will:  - take medications as prescribed as evidenced by patient report and record review check glucose DAILY FASTING OR IF SYMPTOMATIC, document, and provide at future appointments collaborate with provider on medication access solutions target a minimum of 150 minutes of moderate intensity exercise weekly engage in dietary modifications by FOLLOWING A HEART HEALTHY DIET/HEALTHY PLATE METHOD   Subjective: [name] year old female who is a primary patient of Carol PerkingFNP.  The patient was referred to the Chronic Care Management team for assistance with care management needs subsequent to provider initiation  of CCM services and plan of care.    Engaged with patient by telephone for follow up visit in response to provider referral for CCM services.   Objective:  LABS:    Lab Results  Component Value Date   CREATININE 0.96 11/19/2021   CREATININE 0.98 10/18/2021   CREATININE 0.99 10/08/2021     Lab Results  Component Value Date   HGBA1C 6.8 (H) 09/29/2021         Component Value Date/Time   CHOL 213 (H) 09/12/2021 0927   TRIG 380 (H) 09/12/2021 0927   HDL 47 09/12/2021 0927   CHOLHDL 4.5 (H) 09/12/2021 0927   CHOLHDL 5.2 05/08/2007 0530   VLDL 38 05/08/2007 0530   LDLCALC 102 (H) 09/12/2021 0927     Clinical ASCVD: No   The 10-year ASCVD risk score (Arnett DK, et al., 2019) is: 15.3%   Values used to calculate the score:     Age: [age]     Sex: Female     Is Non-Hispanic African American: No     Diabetic: Yes     Tobacco smoker: No     Systolic Blood Pressure: 10627mHg     Is BP treated: Yes     HDL Cholesterol: 47 mg/dL     Total Cholesterol: 213 mg/dL    Other: (CHADS2VASc if Afib, PHQ9 if depression, MMRC or CAT for COPD, ACT, DEXA)    BP Readings from Last 3 Encounters:  12/28/21 105/68  12/15/21 124/69  12/03/21 107/63      SDOH:  (Social Determinants of Health) assessments and interventions performed:    No Known Allergies  Medications Reviewed Today     Reviewed by Carol PerkingFNP (Family Nurse Practitioner) on 12/28/21 at 1104  Med List Status: <None>   Medication Order Taking? Sig Documenting Provider Last Dose Status Informant  Accu-Chek Softclix Lancets lancets [id number] Yes check blood sugars twice daily DX E11.65 Carol Perking, FNP Taking Active Self  acetaminophen (TYLENOL) 500 MG tablet [id number] Yes Take 500 mg by mouth every 6 (six) hours as needed for mild pain. [provider] Taking Active Self  aspirin EC 81 MG tablet [id number] Yes Take 1 tablet (81 mg total) by mouth daily. Rogene Houston, MD Taking Active Self   benzonatate (TESSALON) 200 MG capsule [id number] Yes Take 1 capsule (200 mg total) by mouth 3 (three) times daily as needed. Dettinger, Fransisca Kaufmann, MD Taking Active Self  carvedilol (COREG) 3.125 MG tablet [id number] Yes Take 1 tablet (3.125 mg total) by mouth 2 (two) times daily. Erma Heritage, PA-C Taking Active Self  cholecalciferol (VITAMIN D) 1000 UNITS tablet [id number] Yes Take 1,000 Units by mouth daily. [provider] Taking Active Self  empagliflozin (JARDIANCE) 10 MG TABS tablet [id number] Yes Take 1 tablet (10 mg total) by mouth daily before breakfast. Satira Sark, MD Taking Active Self  fluticasone [facility]) 50 MCG/ACT nasal spray [id number] Yes Place 2 sprays into both nostrils as needed for allergies or rhinitis. [provider] Taking Active Self  furosemide (LASIX) 40 MG tablet [id number] Yes Take 1 tablet (40 mg total) by mouth daily. Erma Heritage, PA-C Taking Active Self  gabapentin (NEURONTIN) 300 MG capsule [id number] Yes Take 300 mg by mouth 3 (three) times daily as needed (Nerve in throat). [provider] Taking Active Self           Med Note Alvino Chapel, [place]   Fri Dec 22, 2020  1:08 PM)    glucose blood (ACCU-CHEK GUIDE) test strip [id number] Yes Use as instructed to test blood sugar daily as directed. DX:E11.9 Carol Perking, FNP Taking Active Self  HYDROcodone-acetaminophen [facility]) 10-325 MG tablet [id number] Yes Take 1 tablet by mouth every 6 (six) hours as needed for severe pain (Arthritis on neck). [provider] Taking Active Self  lactulose (CHRONULAC) 10 GM/15ML solution [id number] Yes TAKE 30 MLS (20 GRAM TOTAL) BY MOUTH 3 TIMES A DAY. Rogene Houston, MD Taking Active Self  lamoTRIgine (LAMICTAL) 200 MG tablet [id number] Yes TAKE (1) TABLET TWICE DAILY. Carol Perking, FNP Taking Active   levocetirizine (XYZAL) 5 MG tablet [id number] Yes Take 1 tablet (5 mg total) by mouth every evening. Carol Perking, FNP  Taking Active Self  LORazepam (ATIVAN) 0.5 MG tablet [id number] Yes Take 1 tablet (0.5 mg total) by mouth 2 (two) times daily as needed for anxiety. Carol Perking, FNP Taking Active Self  Multiple Vitamins-Minerals (MULTIVITAMIN WITH MINERALS) tablet [id number] Yes Take 1 tablet by mouth daily. [provider] Taking Active Self  naloxone [facility]) nasal spray 4 mg/0.1 mL [id number] Yes Place 1 spray into the nose once. Take by nasal route every 3 minutes until patient awakes or EMS arrives. [provider] Taking Active Self  ondansetron (ZOFRAN) 4 MG tablet [id number] Yes Take 1 tablet (4 mg total) by mouth every 8 (eight) hours as needed for nausea or vomiting. Carol Perking, FNP Taking Active Self  pantoprazole (PROTONIX) 40 MG tablet [id number] Yes TAKE (1) TABLET TWICE DAILY. Gabriel Rung, NP Taking Active Self  potassium chloride SA (KLOR-CON M) 20 MEQ tablet [id number] Yes Take 2 tablets (40 mEq total) by mouth daily. Jani Gravel, MD Taking Active Self  sacubitril-valsartan [facility]) 24-26 MG [id number] Yes Take 1 tablet by mouth 2 (two) times daily. Satira Sark, MD  Taking Active Self              Goals Addressed               This Visit's Progress     Patient Stated     T2DM, HLD (pt-stated)        Current Barriers:  Unable to independently afford treatment regimen Unable to maintain control of T2DM, HLD Suboptimal therapeutic regimen for T2DM, HLD   Pharmacist Clinical Goal(s):  patient will verbalize ability to afford treatment regimen achieve control of T2DM, HLD as evidenced by GOAL LABS adhere to plan to optimize therapeutic regimen for T2DM, HLD as evidenced by report of adherence to recommended medication management changes through collaboration with PharmD and provider.    Interventions: 1:1 collaboration with Carol Perking, FNP regarding development and update of comprehensive plan of care as evidenced by provider attestation  and co-signature Inter-disciplinary care team collaboration (see longitudinal plan of care) Comprehensive medication review performed; medication list updated in electronic medical record  Diabetes: New goal. Uncontrolled; current treatment:JARDIANCE 10MG;  Patient reports increased FBG up to [place] to 42m daily Will have patient double up and take (2) of her 19mtablets to use her current supply [employer] jardiance free med assistance May consider GLP1 therapy for CV benefit and patient requesting for weight management Current glucose readings: fasting glucose: 150-200, post prandial glucose: n/a Denies hypoglycemic/hyperglycemic symptoms Recommended increase in jardiance, encouraged compliance with diet/lifestyle  Hyperlipidemia:  New goal. Uncontrolled-LDL>100 (GOAL <70); current treatment:n/a;  Patient states statin was stopped by hospital and never resumed Messaged PCP re: restart of statin given LDL>100 and cardiace history Reivewed chart notes and do not see contraindicated--mess sent to PCP re: restart low dose statin and work up Medications previously tried: rosuvastatin  Current dietary patterns: encouraged heart healthy/mediterranean diet Educated on diet/lifestyle, diabetes/HLD Recommended restart statin   Patient Goals/Self-Care Activities patient will:  - take medications as prescribed as evidenced by patient report and record review check glucose DAILY FASTING OR IF SYMPTOMATIC, document, and provide at future appointments collaborate with provider on medication access solutions target a minimum of 150 minutes of moderate intensity exercise weekly engage in dietary modifications by FOLLOWING A HEART HEALTHY DIET/HEALTHY PLATE METHOD          Plan: Telephone follow up appointment with care management team member scheduled for:  1 month      JuRegina EckPharmD, BCPS Clinical Pharmacist, [employer] Phone [phone]

## 2021-12-24 ENCOUNTER — Other Ambulatory Visit: Payer: Self-pay | Admitting: Family Medicine

## 2021-12-24 DIAGNOSIS — R569 Unspecified convulsions: Secondary | ICD-10-CM

## 2021-12-24 NOTE — Telephone Encounter (Signed)
Pt has follow up appt this week with PCP and she has had her cardiac ablation procedure.

## 2021-12-25 ENCOUNTER — Other Ambulatory Visit: Payer: Self-pay | Admitting: Cardiology

## 2021-12-25 DIAGNOSIS — I050 Rheumatic mitral stenosis: Secondary | ICD-10-CM

## 2021-12-25 DIAGNOSIS — Z8679 Personal history of other diseases of the circulatory system: Secondary | ICD-10-CM

## 2021-12-25 DIAGNOSIS — I429 Cardiomyopathy, unspecified: Secondary | ICD-10-CM

## 2021-12-26 ENCOUNTER — Ambulatory Visit: Payer: Medicare HMO | Attending: Cardiology

## 2021-12-26 DIAGNOSIS — I442 Atrioventricular block, complete: Secondary | ICD-10-CM | POA: Diagnosis not present

## 2021-12-26 DIAGNOSIS — I471 Supraventricular tachycardia, unspecified: Secondary | ICD-10-CM | POA: Diagnosis not present

## 2021-12-26 LAB — CUP PACEART INCLINIC DEVICE CHECK
Date Time Interrogation Session: 20231108162726
Implantable Lead Connection Status: 753985
Implantable Lead Connection Status: 753985
Implantable Lead Implant Date: 20231027
Implantable Lead Implant Date: 20231027
Implantable Lead Location: 753859
Implantable Lead Location: 753860
Implantable Lead Model: 3830
Implantable Lead Model: 5076
Implantable Pulse Generator Implant Date: 20231027

## 2021-12-26 NOTE — Patient Instructions (Addendum)
   After Your Pacemaker   Monitor your pacemaker site for redness, swelling, and drainage. Call the device clinic at [phone] if you experience these symptoms or fever/chills.  Your incision was closed with Steri-strips or staples:  You may shower 7 days after your procedure and wash your incision with soap and water. Avoid lotions, ointments, or perfumes over your incision until it is well-healed.  You may use a hot tub or a pool after your wound check appointment if the incision is completely closed.  Do not lift, push or pull greater than 10 pounds with the affected arm until January 25 2022. There are no other restrictions in arm movement after your wound check appointment.  You may drive, unless driving has been restricted by your healthcare providers.   Remote monitoring is used to monitor your pacemaker from home. This monitoring is scheduled every 91 days by our office. It allows [place] to keep an eye on the functioning of your device to ensure it is working properly. You will routinely see your Electrophysiologist annually (more often if necessary).

## 2021-12-26 NOTE — Progress Notes (Signed)

## 2021-12-28 ENCOUNTER — Ambulatory Visit (INDEPENDENT_AMBULATORY_CARE_PROVIDER_SITE_OTHER): Payer: Medicare HMO | Admitting: Family Medicine

## 2021-12-28 ENCOUNTER — Encounter: Payer: Self-pay | Admitting: Family Medicine

## 2021-12-28 VITALS — BP 105/68 | HR 71 | Temp 98.1°F | Ht 64.0 in | Wt 168.0 lb

## 2021-12-28 DIAGNOSIS — I5022 Chronic systolic (congestive) heart failure: Secondary | ICD-10-CM

## 2021-12-28 DIAGNOSIS — Z950 Presence of cardiac pacemaker: Secondary | ICD-10-CM | POA: Diagnosis not present

## 2021-12-28 DIAGNOSIS — Z09 Encounter for follow-up examination after completed treatment for conditions other than malignant neoplasm: Secondary | ICD-10-CM

## 2021-12-28 DIAGNOSIS — I152 Hypertension secondary to endocrine disorders: Secondary | ICD-10-CM | POA: Diagnosis not present

## 2021-12-28 DIAGNOSIS — E1159 Type 2 diabetes mellitus with other circulatory complications: Secondary | ICD-10-CM | POA: Diagnosis not present

## 2021-12-28 DIAGNOSIS — I471 Supraventricular tachycardia, unspecified: Secondary | ICD-10-CM

## 2021-12-28 NOTE — Patient Instructions (Signed)
Pacemaker Implantation, Adult Pacemaker implantation is a procedure to place a pacemaker inside the chest. A pacemaker is a small computer that sends electrical signals to the heart and helps the heart beat normally. A pacemaker also stores information about heart rhythms. You may need pacemaker implantation if you have: A slow heartbeat (bradycardia). Loss of consciousness that happens repeatedly (syncope) or repeated episodes of dizziness or light-headedness because of an irregular heart rate. Shortness of breath (dyspnea) due to heart problems. The pacemaker usually attaches to your heart through a wire called a lead. One or two leads may be needed. There are different types of pacemakers: Transvenous pacemaker. This type is placed under the skin or muscle of your upper chest area. The lead goes through a vein in the chest area to reach the inside of the heart. Epicardial pacemaker. This type is placed under the skin or muscle of your chest or abdomen. The lead goes through your chest to the outside of the heart. Tell a health care provider about: Any allergies you have. All medicines you are taking, including vitamins, herbs, eye drops, creams, and over-the-counter medicines. Any problems you or family members have had with anesthetic medicines. Any blood or bone disorders you have. Any surgeries you have had. Any medical conditions you have. Whether you are pregnant or may be pregnant. What are the risks? Generally, this is a safe procedure. However, problems may occur, including: Infection. Bleeding. Failure of the pacemaker or the lead. Collapse of a lung or bleeding into a lung. Blood clot inside a blood vessel with a lead. Damage to the heart. Infection inside the heart (endocarditis). Allergic reactions to medicines. What happens before the procedure? Staying hydrated Follow instructions from your health care provider about hydration, which may include: Up to 2 hours before the  procedure - you may continue to drink clear liquids, such as water, clear fruit juice, black coffee, and plain tea.  Eating and drinking restrictions Follow instructions from your health care provider about eating and drinking, which may include: 8 hours before the procedure - stop eating heavy meals or foods, such as meat, fried foods, or fatty foods. 6 hours before the procedure - stop eating light meals or foods, such as toast or cereal. 6 hours before the procedure - stop drinking milk or drinks that contain milk. 2 hours before the procedure - stop drinking clear liquids. Medicines Ask your health care provider about: Changing or stopping your regular medicines. This is especially important if you are taking diabetes medicines or blood thinners. Taking medicines such as aspirin and ibuprofen. These medicines can thin your blood. Do not take these medicines unless your health care provider tells you to take them. Taking over-the-counter medicines, vitamins, herbs, and supplements. Tests You may have: A heart evaluation. This may include: An electrocardiogram (ECG). This involves placing patches on your skin to check your heart rhythm. A chest X-ray. An echocardiogram. This is a test that uses sound waves (ultrasound) to produce an image of the heart. A cardiac rhythm monitor. This is used to record your heart rhythm and any events for a longer period of time. Blood tests. Genetic testing. General instructions Do not use any products that contain nicotine or tobacco for at least 4 weeks before the procedure. These products include cigarettes, e-cigarettes, and chewing tobacco. If you need help quitting, ask your health care provider. Ask your health care provider: How your surgery site will be marked. What steps will be taken to help prevent  infection. These steps may include: Removing hair at the surgery site. Washing skin with a germ-killing soap. Receiving antibiotic  medicine. Plan to have someone take you home from the hospital or clinic. If you will be going home right after the procedure, plan to have someone with you for 24 hours. What happens during the procedure? An IV will be inserted into one of your veins. You will be given one or more of the following: A medicine to help you relax (sedative). A medicine to numb the area (local anesthetic). A medicine to make you fall asleep (general anesthetic). The next steps vary depending on the type of pacemaker you will be getting. If you are getting a transvenous pacemaker: An incision will be made in your upper chest. A pocket will be made for the pacemaker. It may be placed under the skin or between layers of muscle. The lead will be inserted into a blood vessel that goes to the heart. While X-rays are taken by an imaging machine (fluoroscopy), the lead will be advanced through the vein to the inside of your heart. The other end of the lead will be tunneled under the skin and attached to the pacemaker. If you are getting an epicardial pacemaker: An incision will be made near your ribs or breastbone (sternum) for the lead. The lead will be attached to the outside of your heart. Another incision will be made in your chest or upper abdomen to create a pocket for the pacemaker. The free end of the lead will be tunneled under the skin and attached to the pacemaker. The transvenous or epicardial pacemaker will be tested. Imaging studies may be done to check the lead position. The incisions will be closed with stitches (sutures), adhesive strips, or skin glue. Bandages (dressings) will be placed over the incisions. The procedure may vary among health care providers and hospitals. What happens after the procedure? Your blood pressure, heart rate, breathing rate, and blood oxygen level will be monitored until you leave the hospital or clinic. You may be given antibiotics. You will be given pain medicine. An  ECG and chest X-rays will be done. You may need to wear a continuous type of ECG (Holter monitor) to check your heart rhythm. Your health care provider will program the pacemaker. If you were given a sedative during the procedure, it can affect you for several hours. Do not drive or operate machinery until your health care provider says that it is safe. You will be given a pacemaker identification card. This card lists the implant date, device model, and manufacturer of your pacemaker. Summary A pacemaker is a small computer that sends electrical signals to the heart and helps the heart beat normally. There are different types of pacemakers. A pacemaker may be placed under the skin or muscle of your chest or abdomen. Follow instructions from your health care provider about eating and drinking and about taking medicines before the procedure. This information is not intended to replace advice given to you by your health care provider. Make sure you discuss any questions you have with your health care provider. Document Revised: 10/17/2020 Document Reviewed: 01/06/2019 Elsevier Patient Education  [place].

## 2021-12-28 NOTE — Progress Notes (Signed)
Established Patient Office Visit  Subjective   [name], female    DOB: 10/01/1950  Age: 70 y.o. MRN: [id number]  Chief Complaint  Patient presents with   Hospitalization Follow-up    HPI Adyson was admitted to [facility] on 12/14/21 and discharged on 12/15/21 for a cardiac ablation for SVT by Dr. Lovena Le. During the ablation she had transient heart black for 30 seconds so an dual-chamber pacemaker was placed without complication. She had a f/u CXR that was negative for pneumothorax prior to discharge. She had a visit for a wound check and pacemaker with the team 2 days ago. This is healing well and functioning properly. She has an echo scheduled in a few weeks followed by a follow up appt with cardiology. She reports feeling well. She denies chest pain, shortness of breath, edema, palpitations, dizziness, confusion, weakness, fever, or drainage from the site. She is currently on aspirin, jardiance, coreg, lasix, and entresto.     ROS As per HPI.    Objective:     BP 105/68   Pulse 71   Temp 98.1 F (36.7 C)   Ht 5' 4"  (1.626 m)   Wt 168 lb (76.2 kg)   SpO2 94%   BMI 28.84 kg/m  Wt Readings from Last 3 Encounters:  12/28/21 168 lb (76.2 kg)  12/14/21 172 lb (78 kg)  12/03/21 172 lb (78 kg)   BP Readings from Last 3 Encounters:  12/28/21 105/68  12/15/21 124/69  12/03/21 107/63   Physical Exam Vitals and nursing note reviewed.  Constitutional:      General: She is not in acute distress.    Appearance: She is not ill-appearing, toxic-appearing or diaphoretic.  Cardiovascular:     Rate and Rhythm: Normal rate and regular rhythm.     Heart sounds: Normal heart sounds. No murmur heard.    Comments: No signs of infection at pacemaker site Pulmonary:     Effort: Pulmonary effort is normal. No respiratory distress.     Breath sounds: Normal breath sounds.  Abdominal:     General: Bowel sounds are normal. There is no distension.     Palpations: Abdomen is  soft.     Tenderness: There is no abdominal tenderness.  Musculoskeletal:     Right lower leg: No edema.     Left lower leg: No edema.  Skin:    General: Skin is warm and dry.  Neurological:     Mental Status: She is alert and oriented to person, place, and time. Mental status is at baseline.  Psychiatric:        Mood and Affect: Mood normal.        Behavior: Behavior normal.      No results found for any visits on 12/28/21.    The 10-year ASCVD risk score (Arnett DK, et al., 2019) is: 15.3%    Assessment & Plan:   Kayani was seen today for hospitalization follow-up.  Diagnoses and all orders for this visit:  SVT (supraventricular tachycardia) S/P placement of cardiac pacemaker Doing well after pacemaker insertion. She has had follow up with the team for this.   Chronic HFrEF (heart failure with reduced ejection fraction) ([place]) Managed by cardiology. On etresto, coreg, lasix, and jardiance. Has echo and cardiology follow up scheduled.   Hypertension associated with type 2 diabetes mellitus ([place]) Well controlled on current regimen.   Hospital discharge follow-up Reviewed hospital discharge summary.    Return in about 8 weeks (around 02/23/2022)  for chronic follow up.   The patient indicates understanding of these issues and agrees with the plan.  Gwenlyn Perking, FNP

## 2021-12-31 ENCOUNTER — Other Ambulatory Visit: Payer: Self-pay | Admitting: Cardiology

## 2022-01-03 ENCOUNTER — Other Ambulatory Visit: Payer: Self-pay | Admitting: *Deleted

## 2022-01-03 MED ORDER — ENTRESTO 24-26 MG PO TABS: 1.0000 | ORAL_TABLET | Freq: Two times a day (BID) | ORAL | 3 refills | Status: DC

## 2022-01-03 NOTE — Patient Instructions (Signed)
Visit Information  Following are the goals we discussed today:  Current Barriers:  Unable to independently afford treatment regimen Unable to maintain control of T2DM, HLD Suboptimal therapeutic regimen for T2DM, HLD   Pharmacist Clinical Goal(s):  patient will verbalize ability to afford treatment regimen achieve control of T2DM, HLD as evidenced by GOAL LABS adhere to plan to optimize therapeutic regimen for T2DM, HLD as evidenced by report of adherence to recommended medication management changes through collaboration with PharmD and provider.    Interventions: 1:1 collaboration with Gwenlyn Perking, FNP regarding development and update of comprehensive plan of care as evidenced by provider attestation and co-signature Inter-disciplinary care team collaboration (see longitudinal plan of care) Comprehensive medication review performed; medication list updated in electronic medical record  Diabetes: New goal. Uncontrolled; current treatment:JARDIANCE 10MG;  Patient reports increased FBG up to [place] to 76m daily Will have patient double up and take (2) of her 169mtablets to use her current supply [place] jardiance free med assistance May consider GLP1 therapy for CV benefit and patient requesting for weight management Current glucose readings: fasting glucose: 150-200, post prandial glucose: n/a Denies hypoglycemic/hyperglycemic symptoms Recommended increase in jardiance, encouraged compliance with diet/lifestyle  Hyperlipidemia:  New goal. Uncontrolled-LDL>100 (GOAL <70); current treatment:n/a;  Patient states statin was stopped by hospital and never resumed Messaged PCP re: restart of statin given LDL>100 and cardiace history Reivewed chart notes and do not see contraindicated--mess sent to PCP re: restart low dose statin and work up Medications previously tried: rosuvastatin  Current dietary patterns: encouraged heart  healthy/mediterranean diet Educated on diet/lifestyle, diabetes/HLD Recommended restart statin   Patient Goals/Self-Care Activities patient will:  - take medications as prescribed as evidenced by patient report and record review check glucose DAILY FASTING OR IF SYMPTOMATIC, document, and provide at future appointments collaborate with provider on medication access solutions target a minimum of 150 minutes of moderate intensity exercise weekly engage in dietary modifications by FOLLOWING A HEART HEALTHY DIET/HEALTHY PLATE METHOD    Plan: Telephone follow up appointment with care management team member scheduled for:  1 month  Signature JuRegina EckPharmD, BCPS Clinical Pharmacist, [place] Phone [phone] Please call the care guide team at [phone] you need to cancel or reschedule your appointment.   The patient verbalized understanding of instructions, educational materials, and care plan provided today and DECLINED offer to receive copy of patient instructions, educational materials, and care plan.

## 2022-01-04 ENCOUNTER — Ambulatory Visit: Payer: Self-pay | Admitting: *Deleted

## 2022-01-04 ENCOUNTER — Encounter: Payer: Self-pay | Admitting: *Deleted

## 2022-01-04 DIAGNOSIS — E1165 Type 2 diabetes mellitus with hyperglycemia: Secondary | ICD-10-CM

## 2022-01-04 DIAGNOSIS — K140 Glossitis: Secondary | ICD-10-CM | POA: Diagnosis not present

## 2022-01-04 DIAGNOSIS — E1159 Type 2 diabetes mellitus with other circulatory complications: Secondary | ICD-10-CM

## 2022-01-04 NOTE — Chronic Care Management (AMB) (Signed)
Chronic Care Management   CCM RN Visit Note  01/04/2022 Name: Carol Richmond MRN: [id number] DOB: 25-Dec-1950  Subjective: Carol Richmond is a 71 y.o. year old female who is a primary care patient of Gwenlyn Perking, FNP. The patient was referred to the Chronic Care Management team for assistance with care management needs subsequent to provider initiation of CCM services and plan of care.    Today's Visit:  Engaged with patient by telephone for initial visit.     SDOH Interventions Today    Flowsheet Row Most Recent Value  SDOH Interventions   Food Insecurity Interventions Intervention Not Indicated  Housing Interventions Intervention Not Indicated  Transportation Interventions Intervention Not Indicated  Utilities Interventions Intervention Not Indicated  Financial Strain Interventions Intervention Not Indicated  Physical Activity Interventions Intervention Not Indicated  Stress Interventions Intervention Not Indicated  Social Connections Interventions Intervention Not Indicated         Goals Addressed             This Visit's Progress    CCM (DIABETES) EXPECTED OUTCOME:  MONITOR, SELF-MANAGE AND REDUCE SYMPTOMS OF DIABETES       Current Barriers:  Knowledge Deficits related to Diabetes management Chronic Disease Management support and education needs related to Diabetes and diet Patient reports she checks CBG once daily with fasting readings ranges around 150. Patient is exercising and would like referral to dietician, does not always follow a special diet.  Planned Interventions: Provided education to patient about basic DM disease process; Reviewed medications with patient and discussed importance of medication adherence;        Reviewed prescribed diet with patient carbohydrate modified diet; Counseled on importance of regular laboratory monitoring as prescribed;        Discussed plans with patient for ongoing care management follow up and provided  patient with direct contact information for care management team;      Provided patient with written educational materials related to hypo and hyperglycemia and importance of correct treatment;       Advised patient, providing education and rationale, to check cbg once daily and record        Review of patient status, including review of consultants reports, relevant laboratory and other test results, and medications completed;       Screening for signs and symptoms of depression related to chronic disease state;        Assessed social determinant of health barriers;         Symptom Management: Take medications as prescribed   Attend all scheduled provider appointments Call pharmacy for medication refills 3-7 days in advance of running out of medications Attend church or other social activities Perform all self care activities independently  Perform IADL's (shopping, preparing meals, housekeeping, managing finances) independently Call provider office for new concerns or questions  check blood sugar at prescribed times: once daily check feet daily for cuts, sores or redness enter blood sugar readings and medication or insulin into daily log take the blood sugar log to all doctor visits take the blood sugar meter to all doctor visits trim toenails straight across fill half of plate with vegetables limit fast food meals to no more than 1 per week manage portion size read food labels for fat, fiber, carbohydrates and portion size Look over education mailed -hypoglycemia  Follow Up Plan: Telephone follow up appointment with care management team member scheduled for:  03/01/22 at 9 am       CCM (HYPERTENSION) EXPECTED  OUTCOME: MONITOR, SELF-MANAGE AND REDUCE SYMPTOMS OF HYPERTENSION       Current Barriers:  Knowledge Deficits related to Hypertension management Chronic Disease Management support and education needs related to Hypertension, diet Patient reports she is widowed and lives  alone, is independent with all aspects of her care, continues to drive, goes to the gym approximately 5 days per week, has adult daughter she can call on if needed, does not monitor blood pressure, is considering obtaining blood pressure cuff from [facility] and may start checking BP.  Pt reports she does not always follow a special diet, would like referral to see dietician.  Planned Interventions: Evaluation of current treatment plan related to hypertension self management and patient's adherence to plan as established by provider;   Provided education to patient re: stroke prevention, s/s of heart attack and stroke; Reviewed prescribed diet low sodium diet Reviewed medications with patient and discussed importance of compliance;  Counseled on the importance of exercise goals with target of 150 minutes per week Discussed plans with patient for ongoing care management follow up and provided patient with direct contact information for care management team; Advised patient, providing education and rationale, to monitor blood pressure daily and record, calling PCP for findings outside established parameters;  Discussed complications of poorly controlled blood pressure such as heart disease, stroke, circulatory complications, vision complications, kidney impairment, sexual dysfunction;  Screening for signs and symptoms of depression related to chronic disease state;  Assessed social determinant of health barriers;  In basket message sent to primary care provider requesting referral for dietician  Symptom Management: Take medications as prescribed   Attend all scheduled provider appointments Call pharmacy for medication refills 3-7 days in advance of running out of medications Attend church or other social activities Perform all self care activities independently  Perform IADL's (shopping, preparing meals, housekeeping, managing finances) independently Call provider office for new concerns or questions   check blood pressure weekly choose a place to take my blood pressure (home, clinic or office, retail store) write blood pressure results in a log or diary take blood pressure log to all doctor appointments take medications for blood pressure exactly as prescribed begin an exercise program report new symptoms to your doctor eat more whole grains, fruits and vegetables, lean meats and healthy fats Message sent to your doctor about a referral for dietician Continue working out at the gym- keep up the good work! Look over education mailed- low sodium diet  Follow Up Plan: Telephone follow up appointment with care management team member scheduled for:  03/01/22 at 9 am          Plan:Telephone follow up appointment with care management team member scheduled for:  03/01/22 at 9 am  Jacqlyn Larsen [facility], BSN RN Case Manager [facility] [phone]

## 2022-01-04 NOTE — Plan of Care (Signed)
Chronic Care Management Provider Comprehensive Care Plan    01/04/2022 Name: Carol Richmond MRN: [id number] DOB: 02-07-1951  Referral to Chronic Care Management (CCM) services was placed by Provider:  Marjorie Smolder FNP on Date: 01/01/22.  Chronic Condition 1: HYPERTENSION Provider Assessment and Plan -Hypertension associated with type 2 diabetes mellitus ([place]) Well controlled on current regimen.    Expected Outcome/Goals Addressed This Visit (Provider CCM goals/Provider Assessment and plan   Symptom Management Condition 1: Take medications as prescribed   Attend all scheduled provider appointments Call pharmacy for medication refills 3-7 days in advance of running out of medications Attend church or other social activities Perform all self care activities independently  Perform IADL's (shopping, preparing meals, housekeeping, managing finances) independently Call provider office for new concerns or questions  check blood pressure weekly choose a place to take my blood pressure (home, clinic or office, retail store) write blood pressure results in a log or diary take blood pressure log to all doctor appointments take medications for blood pressure exactly as prescribed begin an exercise program report new symptoms to your doctor eat more whole grains, fruits and vegetables, lean meats and healthy fats Message sent to your doctor about a referral for dietician Continue working out at the gym- keep up the good work! Look over education mailed- low sodium diet  Chronic Condition 2: DIABETES Provider Assessment and Plan -Hypertension associated with type 2 diabetes mellitus ([place]) Well controlled on current regimen.   Expected Outcome/Goals Addressed This Visit (Provider CCM goals/Provider Assessment and plan  CCM (DIABETES) EXPECTED OUTCOME:  MONITOR, SELF-MANAGE AND REDUCE SYMPTOMS OF DIABETES  Symptom Management Condition 2: Take medications as prescribed   Attend all  scheduled provider appointments Call pharmacy for medication refills 3-7 days in advance of running out of medications Attend church or other social activities Perform all self care activities independently  Perform IADL's (shopping, preparing meals, housekeeping, managing finances) independently Call provider office for new concerns or questions  check blood sugar at prescribed times: once daily check feet daily for cuts, sores or redness enter blood sugar readings and medication or insulin into daily log take the blood sugar log to all doctor visits take the blood sugar meter to all doctor visits trim toenails straight across fill half of plate with vegetables limit fast food meals to no more than 1 per week manage portion size read food labels for fat, fiber, carbohydrates and portion size Look over education mailed -hypoglycemia  Problem List Patient Active Problem List   Diagnosis Date Noted   S/P placement of cardiac pacemaker 12/28/2021   Heart block AV complete ([place]) 12/15/2021   LBBB (left bundle branch block) 12/15/2021   Controlled substance agreement signed 12/03/2021   Mitral stenosis with regurgitation 09/30/2021   Wide-complex tachycardia 09/30/2021   SVT (supraventricular tachycardia) 09/28/2021   Chronic HFrEF (heart failure with reduced ejection fraction) ([place]) 09/28/2021   Moderate nonproliferative diabetic retinopathy of left eye, associated with type 2 diabetes mellitus ([place]) 09/20/2021   Posterior vitreous detachment of left eye 09/20/2021   Stable central retinal vein occlusion of right eye 09/20/2021   Hypertensive retinopathy of both eyes, grade 1 09/20/2021   Nuclear sclerotic cataract of left eye 09/20/2021   Benzodiazepine dependence ([place]) 05/15/2021   Angina, class III ([place]) 06/27/2020   Cirrhosis, nonalcoholic ([place]) 25/85/2778   Cervical spondylosis 06/07/2019   Peptic ulcer disease 01/25/2019   Iron deficiency anemia 01/25/2019   Fatty liver  disease, nonalcoholic 24/23/5361   Cardiomyopathy (  [place]) 08/07/2017   Non-rheumatic mitral regurgitation 08/07/2017   Non-rheumatic tricuspid valve insufficiency 08/07/2017   PAD (peripheral artery disease) ([place]) 08/07/2017   Controlled type 2 diabetes mellitus with hyperglycemia, without long-term current use of insulin ([place]) 08/07/2017   Carotid artery stenosis, symptomatic, right 07/01/2017   Stenosis of right carotid artery 06/23/2017   Orthostatic tremor 06/23/2017   Generalized anxiety disorder 07/01/2016   Mild neurocognitive disorder 05/02/2016   Migraine without aura and without status migrainosus, not intractable 01/27/2015   Depression, recurrent ([place]) 07/25/2014   Chronic daily headache 12/03/2013   Osteoarthritis of right knee 09/03/2012   Right knee DJD    IBS (irritable bowel syndrome)    Diverticulosis of colon (without mention of hemorrhage)    GERD (gastroesophageal reflux disease)    Hypertension associated with type 2 diabetes mellitus (HCC)    Partial seizure ([place])    Hemorrhoids, external, without mention of complication     Medication Management  Current Outpatient Medications:    Accu-Chek Softclix Lancets lancets, check blood sugars twice daily DX E11.65, Disp: 200 each, Rfl: 3   acetaminophen (TYLENOL) 500 MG tablet, Take 500 mg by mouth every 6 (six) hours as needed for mild pain., Disp: , Rfl:    aspirin EC 81 MG tablet, Take 1 tablet (81 mg total) by mouth daily., Disp:  , Rfl:    benzonatate (TESSALON) 200 MG capsule, Take 1 capsule (200 mg total) by mouth 3 (three) times daily as needed., Disp: 30 capsule, Rfl: 3   carvedilol (COREG) 3.125 MG tablet, TAKE (1) TABLET TWICE DAILY., Disp: 60 tablet, Rfl: 6   cholecalciferol (VITAMIN D) 1000 UNITS tablet, Take 1,000 Units by mouth daily., Disp: , Rfl:    empagliflozin (JARDIANCE) 10 MG TABS tablet, Take 1 tablet (10 mg total) by mouth daily before breakfast. (Patient taking differently: Take 20 mg by mouth  daily before breakfast.), Disp: 90 tablet, Rfl: 3   fluticasone (FLONASE) 50 MCG/ACT nasal spray, Place 2 sprays into both nostrils as needed for allergies or rhinitis., Disp: , Rfl:    furosemide (LASIX) 40 MG tablet, Take 1 tablet (40 mg total) by mouth daily., Disp: 90 tablet, Rfl: 3   gabapentin (NEURONTIN) 300 MG capsule, Take 300 mg by mouth 3 (three) times daily as needed (Nerve in throat)., Disp: , Rfl:    glucose blood (ACCU-CHEK GUIDE) test strip, Use as instructed to test blood sugar daily as directed. DX:E11.9, Disp: 50 each, Rfl: 12   HYDROcodone-acetaminophen (NORCO) 10-325 MG tablet, Take 1 tablet by mouth every 6 (six) hours as needed for severe pain (Arthritis on neck)., Disp: , Rfl:    lactulose (CHRONULAC) 10 GM/15ML solution, TAKE 30 MLS (20 GRAM TOTAL) BY MOUTH 3 TIMES A DAY., Disp: 2700 mL, Rfl: 5   lamoTRIgine (LAMICTAL) 200 MG tablet, TAKE (1) TABLET TWICE DAILY., Disp: 60 tablet, Rfl: 0   levocetirizine (XYZAL) 5 MG tablet, Take 1 tablet (5 mg total) by mouth every evening., Disp: 30 tablet, Rfl: 5   LORazepam (ATIVAN) 0.5 MG tablet, Take 1 tablet (0.5 mg total) by mouth 2 (two) times daily as needed for anxiety., Disp: 30 tablet, Rfl: 5   Multiple Vitamins-Minerals (MULTIVITAMIN WITH MINERALS) tablet, Take 1 tablet by mouth daily., Disp: , Rfl:    naloxone (NARCAN) nasal spray 4 mg/0.1 mL, Place 1 spray into the nose once. Take by nasal route every 3 minutes until patient awakes or EMS arrives., Disp: , Rfl:    ondansetron (ZOFRAN) 4  MG tablet, Take 1 tablet (4 mg total) by mouth every 8 (eight) hours as needed for nausea or vomiting., Disp: 20 tablet, Rfl: 0   pantoprazole (PROTONIX) 40 MG tablet, TAKE (1) TABLET TWICE DAILY., Disp: 60 tablet, Rfl: 5   potassium chloride SA (KLOR-CON M) 20 MEQ tablet, Take 2 tablets (40 mEq total) by mouth daily., Disp: 90 tablet, Rfl: 3   sacubitril-valsartan (ENTRESTO) 24-26 MG, Take 1 tablet by mouth 2 (two) times daily., Disp: 180  tablet, Rfl: 3  Cognitive Assessment Identity Confirmed: : Name; DOB Cognitive Status: Normal   Functional Assessment Hearing Difficulty or Deaf: yes Hearing Management: has bil hearing aides Wear Glasses or Blind: yes Vision Management: can see well w/ glasses Concentrating, Remembering or Making Decisions Difficulty (CP): no Difficulty Communicating: no Difficulty Eating/Swallowing: no Walking or Climbing Stairs Difficulty: no Dressing/Bathing Difficulty: no Doing Errands Independently Difficulty (such as shopping) (CP): no   Caregiver Assessment  Primary Source of Support/Comfort: child(ren) Name of Support/Comfort Primary Source: adult daughter Karren Cobble in Home: alone   Planned Interventions  Evaluation of current treatment plan related to hypertension self management and patient's adherence to plan as established by provider;   Provided education to patient re: stroke prevention, s/s of heart attack and stroke; Reviewed prescribed diet low sodium diet Reviewed medications with patient and discussed importance of compliance;  Counseled on the importance of exercise goals with target of 150 minutes per week Discussed plans with patient for ongoing care management follow up and provided patient with direct contact information for care management team; Advised patient, providing education and rationale, to monitor blood pressure daily and record, calling PCP for findings outside established parameters;  Discussed complications of poorly controlled blood pressure such as heart disease, stroke, circulatory complications, vision complications, kidney impairment, sexual dysfunction;  Screening for signs and symptoms of depression related to chronic disease state;  Assessed social determinant of health barriers;  In basket message sent to primary care provider requesting referral for dietician Provided education to patient about basic DM disease process; Reviewed medications  with patient and discussed importance of medication adherence;        Reviewed prescribed diet with patient carbohydrate modified diet; Counseled on importance of regular laboratory monitoring as prescribed;        Discussed plans with patient for ongoing care management follow up and provided patient with direct contact information for care management team;      Provided patient with written educational materials related to hypo and hyperglycemia and importance of correct treatment;       Advised patient, providing education and rationale, to check cbg once daily and record        Review of patient status, including review of consultants reports, relevant laboratory and other test results, and medications completed;       Screening for signs and symptoms of depression related to chronic disease state;        Assessed social determinant of health barriers;         Interaction and coordination with outside resources, practitioners, and providers See CCM Referral  Care Plan: Printed and mailed to patient

## 2022-01-04 NOTE — Patient Instructions (Signed)
Please call the care guide team at [phone] if you need to cancel or reschedule your appointment.   If you are experiencing a Mental Health or [place] [facility] or need someone to talk to, please call the Suicide and Crisis Lifeline: 988 call the [place] National Suicide Prevention Lifeline: 781-602-0650 or TTY: (469)194-0484 TTY (972)356-5225) to talk to a trained counselor call 1-800-273-TALK (toll free, 24 hour hotline) go to [facility] [place] [phone]) call the [facility]: [phone] call 911   Following is a copy of your full provider care plan:   Goals Addressed             This Visit's Progress    CCM (DIABETES) EXPECTED OUTCOME:  MONITOR, SELF-MANAGE AND REDUCE SYMPTOMS OF DIABETES       Current Barriers:  Knowledge Deficits related to Diabetes management Chronic Disease Management support and education needs related to Diabetes and diet Patient reports she checks CBG once daily with fasting readings ranges around 150. Patient is exercising and would like referral to dietician, does not always follow a special diet.  Planned Interventions: Provided education to patient about basic DM disease process; Reviewed medications with patient and discussed importance of medication adherence;        Reviewed prescribed diet with patient carbohydrate modified diet; Counseled on importance of regular laboratory monitoring as prescribed;        Discussed plans with patient for ongoing care management follow up and provided patient with direct contact information for care management team;      Provided patient with written educational materials related to hypo and hyperglycemia and importance of correct treatment;       Advised patient, providing education and rationale, to check cbg once daily and record        Review of patient status, including review of consultants reports, relevant laboratory and  other test results, and medications completed;       Screening for signs and symptoms of depression related to chronic disease state;        Assessed social determinant of health barriers;         Symptom Management: Take medications as prescribed   Attend all scheduled provider appointments Call pharmacy for medication refills 3-7 days in advance of running out of medications Attend church or other social activities Perform all self care activities independently  Perform IADL's (shopping, preparing meals, housekeeping, managing finances) independently Call provider office for new concerns or questions  check blood sugar at prescribed times: once daily check feet daily for cuts, sores or redness enter blood sugar readings and medication or insulin into daily log take the blood sugar log to all doctor visits take the blood sugar meter to all doctor visits trim toenails straight across fill half of plate with vegetables limit fast food meals to no more than 1 per week manage portion size read food labels for fat, fiber, carbohydrates and portion size Look over education mailed -hypoglycemia  Follow Up Plan: Telephone follow up appointment with care management team member scheduled for:  03/01/22 at 9 am       CCM (HYPERTENSION) EXPECTED OUTCOME: MONITOR, SELF-MANAGE AND REDUCE SYMPTOMS OF HYPERTENSION       Current Barriers:  Knowledge Deficits related to Hypertension management Chronic Disease Management support and education needs related to Hypertension, diet Patient reports she is widowed and lives alone, is independent with all aspects of her care, continues to drive, goes to the  gym approximately 5 days per week, has adult daughter she can call on if needed, does not monitor blood pressure, is considering obtaining blood pressure cuff from [facility] and may start checking BP.  Pt reports she does not always follow a special diet, would like referral to see dietician.  Planned  Interventions: Evaluation of current treatment plan related to hypertension self management and patient's adherence to plan as established by provider;   Provided education to patient re: stroke prevention, s/s of heart attack and stroke; Reviewed prescribed diet low sodium diet Reviewed medications with patient and discussed importance of compliance;  Counseled on the importance of exercise goals with target of 150 minutes per week Discussed plans with patient for ongoing care management follow up and provided patient with direct contact information for care management team; Advised patient, providing education and rationale, to monitor blood pressure daily and record, calling PCP for findings outside established parameters;  Discussed complications of poorly controlled blood pressure such as heart disease, stroke, circulatory complications, vision complications, kidney impairment, sexual dysfunction;  Screening for signs and symptoms of depression related to chronic disease state;  Assessed social determinant of health barriers;  In basket message sent to primary care provider requesting referral for dietician  Symptom Management: Take medications as prescribed   Attend all scheduled provider appointments Call pharmacy for medication refills 3-7 days in advance of running out of medications Attend church or other social activities Perform all self care activities independently  Perform IADL's (shopping, preparing meals, housekeeping, managing finances) independently Call provider office for new concerns or questions  check blood pressure weekly choose a place to take my blood pressure (home, clinic or office, retail store) write blood pressure results in a log or diary take blood pressure log to all doctor appointments take medications for blood pressure exactly as prescribed begin an exercise program report new symptoms to your doctor eat more whole grains, fruits and vegetables, lean  meats and healthy fats Message sent to your doctor about a referral for dietician Continue working out at the gym- keep up the good work! Look over education mailed- low sodium diet  Follow Up Plan: Telephone follow up appointment with care management team member scheduled for:  03/01/22 at 9 am          The patient verbalized understanding of instructions, educational materials, and care plan provided today and agreed to receive a mailed copy of patient instructions, educational materials, and care plan.   Telephone follow up appointment with care management team member scheduled for:  03/01/22 at 9 am  Low-Sodium Eating Plan Sodium, which is an element that makes up salt, helps you maintain a healthy balance of fluids in your body. Too much sodium can increase your blood pressure and cause fluid and waste to be held in your body. Your health care provider or dietitian may recommend following this plan if you have high blood pressure (hypertension), kidney disease, liver disease, or heart failure. Eating less sodium can help lower your blood pressure, reduce swelling, and protect your heart, liver, and kidneys. What are tips for following this plan? Reading food labels The Nutrition Facts label lists the amount of sodium in one serving of the food. If you eat more than one serving, you must multiply the listed amount of sodium by the number of servings. Choose foods with less than 140 mg of sodium per serving. Avoid foods with 300 mg of sodium or more per serving. Shopping  Look for lower-sodium products,  often labeled as "low-sodium" or "no salt added." Always check the sodium content, even if foods are labeled as "unsalted" or "no salt added." Buy fresh foods. Avoid canned foods and pre-made or frozen meals. Avoid canned, cured, or processed meats. Buy breads that have less than 80 mg of sodium per slice. Cooking  Eat more home-cooked food and less restaurant, buffet, and fast  food. Avoid adding salt when cooking. Use salt-free seasonings or herbs instead of table salt or sea salt. Check with your health care provider or pharmacist before using salt substitutes. Cook with plant-based oils, such as canola, sunflower, or olive oil. Meal planning When eating at a restaurant, ask that your food be prepared with less salt or no salt, if possible. Avoid dishes labeled as brined, pickled, cured, smoked, or made with soy sauce, miso, or teriyaki sauce. Avoid foods that contain MSG (monosodium glutamate). MSG is sometimes added to [place] food, bouillon, and some canned foods. Make meals that can be grilled, baked, poached, roasted, or steamed. These are generally made with less sodium. General information Most people on this plan should limit their sodium intake to 1,500-2,000 mg (milligrams) of sodium each day. What foods should I eat? Fruits Fresh, frozen, or canned fruit. Fruit juice. Vegetables Fresh or frozen vegetables. "No salt added" canned vegetables. "No salt added" tomato sauce and paste. Low-sodium or reduced-sodium tomato and vegetable juice. Grains Low-sodium cereals, including oats, puffed wheat and rice, and shredded wheat. Low-sodium crackers. Unsalted rice. Unsalted pasta. Low-sodium bread. Whole-grain breads and whole-grain pasta. Meats and other proteins Fresh or frozen (no salt added) meat, poultry, seafood, and fish. Low-sodium canned tuna and salmon. Unsalted nuts. Dried peas, beans, and lentils without added salt. Unsalted canned beans. Eggs. Unsalted nut butters. Dairy Milk. Soy milk. Cheese that is naturally low in sodium, such as ricotta cheese, fresh mozzarella, or Swiss cheese. Low-sodium or reduced-sodium cheese. Cream cheese. Yogurt. Seasonings and condiments Fresh and dried herbs and spices. Salt-free seasonings. Low-sodium mustard and ketchup. Sodium-free salad dressing. Sodium-free light mayonnaise. Fresh or refrigerated horseradish. Lemon  juice. Vinegar. Other foods Homemade, reduced-sodium, or low-sodium soups. Unsalted popcorn and pretzels. Low-salt or salt-free chips. The items listed above may not be a complete list of foods and beverages you can eat. Contact a dietitian for more information. What foods should I avoid? Vegetables Sauerkraut, pickled vegetables, and relishes. Olives. [place] fries. Onion rings. Regular canned vegetables (not low-sodium or reduced-sodium). Regular canned tomato sauce and paste (not low-sodium or reduced-sodium). Regular tomato and vegetable juice (not low-sodium or reduced-sodium). Frozen vegetables in sauces. Grains Instant hot cereals. Bread stuffing, pancake, and biscuit mixes. Croutons. Seasoned rice or pasta mixes. Noodle soup cups. Boxed or frozen macaroni and cheese. Regular salted crackers. Self-rising flour. Meats and other proteins Meat or fish that is salted, canned, smoked, spiced, or pickled. Precooked or cured meat, such as sausages or meat loaves. Berniece Salines. Ham. Pepperoni. Hot dogs. Corned beef. Chipped beef. Salt pork. Jerky. Pickled herring. Anchovies and sardines. Regular canned tuna. Salted nuts. Dairy Processed cheese and cheese spreads. Hard cheeses. Cheese curds. Blue cheese. Feta cheese. String cheese. Regular cottage cheese. Buttermilk. Canned milk. Fats and oils Salted butter. Regular margarine. Ghee. Bacon fat. Seasonings and condiments Onion salt, garlic salt, seasoned salt, table salt, and sea salt. Canned and packaged gravies. Worcestershire sauce. Tartar sauce. Barbecue sauce. Teriyaki sauce. Soy sauce, including reduced-sodium. Steak sauce. Fish sauce. Oyster sauce. Cocktail sauce. Horseradish that you find on the shelf. Regular ketchup and mustard. Meat  flavorings and tenderizers. Bouillon cubes. Hot sauce. Pre-made or packaged marinades. Pre-made or packaged taco seasonings. Relishes. Regular salad dressings. Salsa. Other foods Salted popcorn and pretzels. Corn chips  and puffs. Potato and tortilla chips. Canned or dried soups. Pizza. Frozen entrees and pot pies. The items listed above may not be a complete list of foods and beverages you should avoid. Contact a dietitian for more information. Summary Eating less sodium can help lower your blood pressure, reduce swelling, and protect your heart, liver, and kidneys. Most people on this plan should limit their sodium intake to 1,500-2,000 mg (milligrams) of sodium each day. Canned, boxed, and frozen foods are high in sodium. Restaurant foods, fast foods, and pizza are also very high in sodium. You also get sodium by adding salt to food. Try to cook at home, eat more fresh fruits and vegetables, and eat less fast food and canned, processed, or prepared foods. This information is not intended to replace advice given to you by your health care provider. Make sure you discuss any questions you have with your health care provider. Document Revised: 03/12/2019 Document Reviewed: 01/06/2019 Elsevier Patient Education  [place]. Hypoglycemia Hypoglycemia occurs when the level of sugar (glucose) in the blood is too low. Hypoglycemia can happen in people who have or do not have diabetes. It can develop quickly, and it can be a medical emergency. For most people, a blood glucose level below 70 mg/dL (3.9 mmol/L) is considered hypoglycemia. Glucose is a type of sugar that provides the body's main source of energy. Certain hormones (insulin and glucagon) control the level of glucose in the blood. Insulin lowers blood glucose, and glucagon raises blood glucose. Hypoglycemia can result from having too much insulin in the bloodstream, or from not eating enough food that contains glucose. You may also have reactive hypoglycemia, which happens within 4 hours after eating a meal. What are the causes? Hypoglycemia occurs most often in people who have diabetes and may be caused by: Diabetes medicine. Not eating enough, or not  eating often enough. Increased physical activity. Drinking alcohol on an empty stomach. If you do not have diabetes, hypoglycemia may be caused by: A tumor in the pancreas. Not eating enough, or not eating for long periods at a time (fasting). A severe infection or illness. Problems after having bariatric surgery. Organ failure, such as kidney or liver failure. Certain medicines. What increases the risk? Hypoglycemia is more likely to develop in people who: Have diabetes and take medicines to lower blood glucose. Abuse alcohol. Have a severe illness. What are the signs or symptoms? Symptoms vary depending on whether the condition is mild, moderate, or severe. Mild hypoglycemia Hunger. Sweating and feeling clammy. Dizziness or feeling light-headed. Sleepiness or restless sleep. Nausea. Increased heart rate. Headache. Blurry vision. Mood changes, such as irritability or anxiety. Tingling or numbness around the mouth, lips, or tongue. Moderate hypoglycemia Confusion and poor judgment. Behavior changes. Weakness. Irregular heartbeat. A change in coordination. Severe hypoglycemia Severe hypoglycemia is a medical emergency. It can cause: Fainting. Seizures. Loss of consciousness (coma). Death. How is this diagnosed? Hypoglycemia is diagnosed with a blood test to measure your blood glucose level. This blood test is done while you are having symptoms. Your health care provider may also do a physical exam and review your medical history. How is this treated? This condition can be treated by immediately eating or drinking something that contains sugar with 15 grams of fast-acting carbohydrate, such as: 4 oz (120 mL)  of fruit juice. 4 oz (120 mL) of regular soda (not diet soda). Several pieces of hard candy. Check food labels to find out how many pieces to eat for 15 grams. 1 Tbsp (15 mL) of sugar or honey. 4 glucose tablets. 1 tube of glucose gel. Treating hypoglycemia if you  have diabetes If you are alert and able to swallow safely, follow the 15:15 rule: Take 15 grams of a fast-acting carbohydrate. Talk with your health care provider about how much you should take. Options for getting 15 grams of fast-acting carbohydrate include: Glucose tablets (take 4 tablets). Several pieces of hard candy. Check food labels to find out how many pieces to eat for 15 grams. 4 oz (120 mL) of fruit juice. 4 oz (120 mL) of regular soda (not diet soda). 1 Tbsp (15 mL) of sugar or honey. 1 tube of glucose gel. Check your blood glucose 15 minutes after you take the carbohydrate. If the repeat blood glucose level is still at or below 70 mg/dL (3.9 mmol/L), take 15 grams of a carbohydrate again. If your blood glucose level does not increase above 70 mg/dL (3.9 mmol/L) after 3 tries, seek emergency medical care. After your blood glucose level returns to normal, eat a meal or a snack within 1 hour.  Treating severe hypoglycemia Severe hypoglycemia is when your blood glucose level is below 54 mg/dL (3 mmol/L). Severe hypoglycemia is a medical emergency. Get medical help right away. If you have severe hypoglycemia and you cannot eat or drink, you will need to be given glucagon. A family member or close friend should learn how to check your blood glucose and how to give you glucagon. Ask your health care provider if you need to have an emergency glucagon kit available. Severe hypoglycemia may need to be treated in a hospital. The treatment may include getting glucose through an IV. You may also need treatment for the cause of your hypoglycemia. Follow these instructions at home:  General instructions Take over-the-counter and prescription medicines only as told by your health care provider. Monitor your blood glucose as told by your health care provider. If you drink alcohol: Limit how much you have to: 0-1 drink a day for women who are not pregnant. 0-2 drinks a day for men. Know how  much alcohol is in your drink. In the [place] one drink equals one 12 oz bottle of beer (355 mL), one 5 oz glass of wine (148 mL), or one 1 oz glass of hard liquor (44 mL). Be sure to eat food along with drinking alcohol. Be aware that alcohol is absorbed quickly and may have lingering effects that may result in hypoglycemia later. Be sure to do ongoing glucose monitoring. Keep all follow-up visits. This is important. If you have diabetes: Always have a fast-acting carbohydrate (15 grams) option with you to treat low blood glucose. Follow your diabetes management plan as directed by your health care provider. Make sure you: Know the symptoms of hypoglycemia. It is important to treat it right away to prevent it from becoming severe. Check your blood glucose as often as told. Always check before and after exercise. Always check your blood glucose before you drive a motorized vehicle. Take your medicines as told. Follow your meal plan. Eat on time, and do not skip meals. Share your diabetes management plan with people in your workplace, school, and household. Carry a medical alert card or wear medical alert jewelry. Where to find more information American Diabetes Association: www.diabetes.org Contact  a health care provider if: You have problems keeping your blood glucose in your target range. You have frequent episodes of hypoglycemia. Get help right away if: You continue to have hypoglycemia symptoms after eating or drinking something that contains 15 grams of fast-acting carbohydrate, and you cannot get your blood glucose above 70 mg/dL (3.9 mmol/L) while following the 15:15 rule. Your blood glucose is below 54 mg/dL (3 mmol/L). You have a seizure. You faint. These symptoms may represent a serious problem that is an emergency. Do not wait to see if the symptoms will go away. Get medical help right away. Call your local emergency services (911 in the [place] Do not drive yourself to the  hospital. Summary Hypoglycemia occurs when the level of sugar (glucose) in the blood is too low. Hypoglycemia can happen in people who have or do not have diabetes. It can develop quickly, and it can be a medical emergency. Make sure you know the symptoms of hypoglycemia and how to treat it. Always have a fast-acting carbohydrate option with you to treat low blood sugar. This information is not intended to replace advice given to you by your health care provider. Make sure you discuss any questions you have with your health care provider. Document Revised: 01/06/2020 Document Reviewed: 01/06/2020 Elsevier Patient Education  [place].

## 2022-01-07 ENCOUNTER — Ambulatory Visit: Payer: Medicare HMO | Admitting: Family Medicine

## 2022-01-09 ENCOUNTER — Encounter: Payer: Self-pay | Admitting: Family Medicine

## 2022-01-09 ENCOUNTER — Ambulatory Visit (INDEPENDENT_AMBULATORY_CARE_PROVIDER_SITE_OTHER): Payer: Medicare HMO | Admitting: Family Medicine

## 2022-01-09 VITALS — BP 106/70 | HR 83 | Temp 96.8°F | Ht 64.0 in | Wt 168.4 lb

## 2022-01-09 DIAGNOSIS — K140 Glossitis: Secondary | ICD-10-CM | POA: Diagnosis not present

## 2022-01-09 DIAGNOSIS — J301 Allergic rhinitis due to pollen: Secondary | ICD-10-CM | POA: Diagnosis not present

## 2022-01-09 MED ORDER — NYSTATIN 100000 UNIT/ML MT SUSP: 5.0000 mL | Freq: Three times a day (TID) | OROMUCOSAL | 0 refills | Status: DC

## 2022-01-09 MED ORDER — CETIRIZINE HCL 10 MG PO TABS: 10.0000 mg | ORAL_TABLET | Freq: Every day | ORAL | 11 refills | Status: DC

## 2022-01-09 NOTE — Progress Notes (Signed)
Subjective:  [name], female    DOB: 11-Jan-1951, 71 y.o.   MRN: [id number]  Patient Care Team: Gwenlyn Perking, FNP as PCP - General (Family Medicine) Satira Sark, MD as PCP - Cardiology (Cardiology) Cameron Sprang, MD as Consulting Physician (Neurology) Rogene Houston, MD as Consulting Physician (Gastroenterology) Leticia Clas, [place] (Optometry) Angelia Mould, MD as Consulting Physician (Vascular Surgery) Lavera Guise, [facility] as Pharmacist (Family Medicine) Kassie Mends, RN as [place] [facility]   Chief Complaint:  Follow-up (Urgent care follow up -11/17[facility]. Patient states mouth is no better- she is having burning in her mouth still )   HPI: Carol Richmond is a 71 y.o. female presenting on 01/09/2022 for Follow-up (Urgent care follow up -11/17[facility]. Patient states mouth is no better- she is having burning in her mouth still )   Pt presents today with ongoing complaints of oropharynx burning. She was seen by UC and placed on diflucan x 2 doses and peridex mouth wash. She reports this was minimally beneficial. States she still has burning to her mouth and throat. No other associated symptoms.      Relevant past medical, surgical, family, and social history reviewed and updated as indicated.  Allergies and medications reviewed and updated. Data reviewed: Chart in Epic.   Past Medical History:  Diagnosis Date   Asthma    Carotid artery disease ([place])    Diverticulosis of colon (without mention of hemorrhage)    Essential hypertension    Functional ovarian cysts    GERD (gastroesophageal reflux disease)    Hemorrhoids, external, without mention of complication    Hormone replacement therapy (postmenopausal)    IBS (irritable bowel syndrome)    Migraine    Mitral valve disease    Right knee DJD    Seizures (HCC)    Sleep apnea    TIA (transient ischemic attack)    Type 2 diabetes mellitus ([place])      Past Surgical History:  Procedure Laterality Date   BIOPSY  09/23/2018   Procedure: BIOPSY;  Surgeon: Rogene Houston, MD;  [facility]: [facility];  Service: Endoscopy;;  duodeneum and stomach   CHOLECYSTECTOMY  1980   ENDARTERECTOMY Right 07/01/2017   Procedure: ENDARTERECTOMY CAROTID RIGHT;  Surgeon: Serafina Mitchell, MD;  Location: [facility] OR;  Service: Vascular;  Laterality: Right;   ESOPHAGOGASTRODUODENOSCOPY N/A 09/23/2018   Procedure: ESOPHAGOGASTRODUODENOSCOPY (EGD);  Surgeon: Rogene Houston, MD;  [facility];  Service: Endoscopy;  Laterality: N/A;  2:45-moved to 1:45 per Lelon Frohlich   ESOPHAGOGASTRODUODENOSCOPY (EGD) WITH PROPOFOL N/A 03/07/2021   Procedure: ESOPHAGOGASTRODUODENOSCOPY (EGD) WITH PROPOFOL;  Surgeon: Rogene Houston, MD;  [facility];  Service: Endoscopy;  Laterality: N/A;  12:50   LEFT HEART CATHETERIZATION WITH CORONARY ANGIOGRAM N/A 11/18/2013   Procedure: LEFT HEART CATHETERIZATION WITH CORONARY ANGIOGRAM;  Surgeon: Burnell Blanks, MD;  Location: [facility] CATH LAB;  Service: Cardiovascular;  Laterality: N/A;   OOPHORECTOMY     2012 Dr. Adah Perl in [place] N/A 12/14/2021   Procedure: PACEMAKER IMPLANT;  Surgeon: Evans Lance, MD;  Location: [place] CV LAB;  Service: Cardiovascular;  Laterality: N/A;   PATCH ANGIOPLASTY Right 07/01/2017   Procedure: RIGHT CAROTID PATCH ANGIOPLASTY;  Surgeon: Serafina Mitchell, MD;  Location: [facility] OR;  Service: Vascular;  Laterality: Right;   SVT ABLATION N/A 12/14/2021   Procedure: SVT ABLATION;  Surgeon:  Evans Lance, MD;  Location: [place] CV LAB;  Service: Cardiovascular;  Laterality: N/A;   TONSILLECTOMY     TOTAL KNEE ARTHROPLASTY Right 09/02/2012   Procedure: TOTAL KNEE ARTHROPLASTY- right;  Surgeon: Ninetta Lights, MD;  Location: [place];  Service: Orthopedics;  Laterality: Right;   TUBAL LIGATION     TYMPANOMASTOIDECTOMY Left 04/02/2016   Procedure: LEFT CANAL WALL DOWN  TYMPANOMASTOIDECTOMY;  Surgeon: Leta Baptist, MD;  Location: [place];  Service: ENT;  Laterality: Left;   [place] Right 07/02/2017   Procedure: RIGHT NECK EXPLORATION WITH INTRAOPERATIVE ULTRASOUND;  Surgeon: Angelia Mould, MD;  Location: [facility];  Service: Vascular;  Laterality: Right;    Social History   Socioeconomic History   Marital status: Widowed    Spouse name: Not on file   Number of children: 3   Years of education: Not on file   Highest education level: Not on file  Occupational History   Occupation: retired  Tobacco Use   Smoking status: Never   Smokeless tobacco: Never  Vaping Use   Vaping Use: Never used  Substance and Sexual Activity   Alcohol use: No    Alcohol/week: 0.0 standard drinks of alcohol   Drug use: No   Sexual activity: Not Currently  Other Topics Concern   Not on file  Social History Narrative   Lives alone. Granddaughter lives across the street, her 3 children all live nearby   Social Determinants of Health   Financial Resource Strain: Low Risk  (01/04/2022)   Overall Financial Resource Strain (CARDIA)    Difficulty of Paying Living Expenses: Not very hard  Food Insecurity: No Food Insecurity (01/04/2022)   Hunger Vital Sign    Worried About Running Out of Food in the Last Year: Never true    Ran Out of Food in the Last Year: Never true  Transportation Needs: No Transportation Needs (01/04/2022)   PRAPARE - Hydrologist (Medical): No    Lack of Transportation (Non-Medical): No  Physical Activity: Sufficiently Active (01/04/2022)   Exercise Vital Sign    Days of Exercise per Week: 5 days    Minutes of Exercise per Session: 30 min  Stress: No Stress Concern Present (01/04/2022)   [place]    Feeling of Stress : Not at all  Social Connections: Moderately Integrated (01/04/2022)   Social  Connection and Isolation Panel [NHANES]    Frequency of Communication with Friends and Family: More than three times a week    Frequency of Social Gatherings with Friends and Family: More than three times a week    Attends Religious Services: More than 4 times per year    Active Member of [employer] or Organizations: Yes    Attends Archivist Meetings: More than 4 times per year    Marital Status: Widowed  Intimate Partner Violence: Not At Risk (07/05/2021)   Humiliation, Afraid, Rape, and Kick questionnaire    Fear of Current or Ex-Partner: No    Emotionally Abused: No    Physically Abused: No    Sexually Abused: No    Outpatient Encounter Medications as of 01/09/2022  Medication Sig   Accu-Chek Softclix Lancets lancets check blood sugars twice daily DX E11.65   acetaminophen (TYLENOL) 500 MG tablet Take 500 mg by mouth every 6 (six) hours as needed for mild pain.   aspirin EC 81  MG tablet Take 1 tablet (81 mg total) by mouth daily.   benzonatate (TESSALON) 200 MG capsule Take 1 capsule (200 mg total) by mouth 3 (three) times daily as needed.   carvedilol (COREG) 3.125 MG tablet TAKE (1) TABLET TWICE DAILY.   cetirizine (ZYRTEC) 10 MG tablet Take 1 tablet (10 mg total) by mouth daily.   cholecalciferol (VITAMIN D) 1000 UNITS tablet Take 1,000 Units by mouth daily.   empagliflozin (JARDIANCE) 10 MG TABS tablet Take 1 tablet (10 mg total) by mouth daily before breakfast. (Patient taking differently: Take 20 mg by mouth daily before breakfast.)   fluticasone (FLONASE) 50 MCG/ACT nasal spray Place 2 sprays into both nostrils as needed for allergies or rhinitis.   furosemide (LASIX) 40 MG tablet Take 1 tablet (40 mg total) by mouth daily.   gabapentin (NEURONTIN) 300 MG capsule Take 300 mg by mouth 3 (three) times daily as needed (Nerve in throat).   glucose blood (ACCU-CHEK GUIDE) test strip Use as instructed to test blood sugar daily as directed. DX:E11.9   HYDROcodone-acetaminophen  (NORCO) 10-325 MG tablet Take 1 tablet by mouth every 6 (six) hours as needed for severe pain (Arthritis on neck).   lactulose (CHRONULAC) 10 GM/15ML solution TAKE 30 MLS (20 GRAM TOTAL) BY MOUTH 3 TIMES A DAY.   lamoTRIgine (LAMICTAL) 200 MG tablet TAKE (1) TABLET TWICE DAILY.   LORazepam (ATIVAN) 0.5 MG tablet Take 1 tablet (0.5 mg total) by mouth 2 (two) times daily as needed for anxiety.   magic mouthwash (nystatin, lidocaine, diphenhydrAMINE, alum & mag hydroxide) suspension Swish and swallow 5 mLs 3 (three) times daily.   Multiple Vitamins-Minerals (MULTIVITAMIN WITH MINERALS) tablet Take 1 tablet by mouth daily.   naloxone (NARCAN) nasal spray 4 mg/0.1 mL Place 1 spray into the nose once. Take by nasal route every 3 minutes until patient awakes or EMS arrives.   ondansetron (ZOFRAN) 4 MG tablet Take 1 tablet (4 mg total) by mouth every 8 (eight) hours as needed for nausea or vomiting.   pantoprazole (PROTONIX) 40 MG tablet TAKE (1) TABLET TWICE DAILY.   potassium chloride SA (KLOR-CON M) 20 MEQ tablet Take 2 tablets (40 mEq total) by mouth daily.   sacubitril-valsartan (ENTRESTO) 24-26 MG Take 1 tablet by mouth 2 (two) times daily.   [DISCONTINUED] levocetirizine (XYZAL) 5 MG tablet Take 1 tablet (5 mg total) by mouth every evening.   No facility-administered encounter medications on file as of 01/09/2022.    No Known Allergies  Review of Systems  Constitutional:  Positive for appetite change. Negative for activity change, chills, diaphoresis, fatigue, fever and unexpected weight change.  HENT:  Positive for mouth sores, postnasal drip, rhinorrhea, sore throat and voice change. Negative for congestion, dental problem, drooling, ear discharge, ear pain, facial swelling, hearing loss, nosebleeds, sinus pressure, sinus pain, sneezing, tinnitus and trouble swallowing.   Respiratory:  Negative for cough and shortness of breath.   Cardiovascular:  Negative for chest pain, palpitations and leg  swelling.  Gastrointestinal:  Negative for abdominal pain, constipation, diarrhea, nausea and vomiting.  Genitourinary:  Negative for decreased urine volume and difficulty urinating.  Musculoskeletal:  Negative for arthralgias.  Neurological:  Negative for weakness and headaches.  Psychiatric/Behavioral:  Negative for confusion.   All other systems reviewed and are negative.       Objective:  BP 106/70   Pulse 83   Temp (!) 96.8 F (36 C) (Temporal)   Ht 5' 4"  (1.626 m)   Wt  168 lb 6.4 oz (76.4 kg)   SpO2 93%   BMI 28.91 kg/m    Wt Readings from Last 3 Encounters:  01/09/22 168 lb 6.4 oz (76.4 kg)  12/28/21 168 lb (76.2 kg)  12/14/21 172 lb (78 kg)    Physical Exam Vitals and nursing note reviewed.  Constitutional:      General: She is not in acute distress.    Appearance: Normal appearance. She is obese. She is not ill-appearing, toxic-appearing or diaphoretic.  HENT:     Head: Normocephalic and atraumatic.     Nose: Nose normal.     Mouth/Throat:     Lips: Pink.     Mouth: Mucous membranes are moist.     Dentition: Has dentures.     Tongue: No lesions. Tongue does not deviate from midline.     Pharynx: Oropharynx is clear. No oropharyngeal exudate or posterior oropharyngeal erythema.     Comments: Minimal redness to tongue and buccal region Eyes:     Conjunctiva/sclera: Conjunctivae normal.     Pupils: Pupils are equal, round, and reactive to light.  Cardiovascular:     Rate and Rhythm: Normal rate and regular rhythm.     Heart sounds: Normal heart sounds.  Pulmonary:     Effort: Pulmonary effort is normal.     Breath sounds: Normal breath sounds.  Musculoskeletal:     Right lower leg: No edema.     Left lower leg: No edema.  Skin:    General: Skin is warm and dry.     Capillary Refill: Capillary refill takes less than 2 seconds.     Findings: Wound present.       Neurological:     General: No focal deficit present.     Mental Status: She is alert and  oriented to person, place, and time.  Psychiatric:        Mood and Affect: Mood normal.        Behavior: Behavior normal.        Thought Content: Thought content normal.        Judgment: Judgment normal.     Results for orders placed or performed in visit on 12/26/21  CUP PACEART [facility] DEVICE CHECK  Result Value Ref Range   Pulse Generator Manufacturer MERM    Date Time Interrogation Session [id number]    Pulse Gen Model [id number] Azure XT DR MRI    Pulse Gen Serial Number [id number]    Clinic Name [place] Pulse Generator Type Implantable Pulse Generator    Implantable Pulse Generator Implant Date 19147829    Implantable Lead Manufacturer MERM    Implantable Lead Model 3830 SelectSecure MRI SureScan    Implantable Lead Serial Number [id number]    Implantable Lead Implant Date 78469629    Implantable Lead Location Detail 1 UNKNOWN    Implantable Lead Special Function LBBB    Implantable Lead Location [id number]    Implantable Lead Connection Status [id number]    Implantable Lead Manufacturer MERM    Implantable Lead Model 5076 CapSureFix Novus MRI SureScan    Implantable Lead Serial Number [id number]    Implantable Lead Implant Date 52841324    Implantable Lead Location Detail 1 APPENDAGE    Implantable Lead Location [id number]    Implantable Lead Connection Status [id number]    Eval Rhythm SR        Pertinent labs & imaging results that were available during my care of the patient were reviewed by me  and considered in my medical decision making.  Assessment & Plan:  Tarena was seen today for follow-up.  Diagnoses and all orders for this visit:  Glossitis Will check for potential deficiencies. Magic mouth was with lidocaine as prescribed. If symptoms persist, will refer to ENT for evaluation.  -     Folate -     Vitamin B12 -     magic mouthwash (nystatin, lidocaine, diphenhydrAMINE, alum & mag hydroxide) suspension; Swish and swallow 5 mLs 3 (three) times  daily.  Seasonal allergic rhinitis due to pollen Has been on Xyzal for several months, will switch to Zyrtec to see if beneficial.  -     cetirizine (ZYRTEC) 10 MG tablet; Take 1 tablet (10 mg total) by mouth daily.     Continue all other maintenance medications.  Follow up plan: Return if symptoms worsen or fail to improve.   Continue healthy lifestyle choices, including diet (rich in fruits, vegetables, and lean proteins, and low in salt and simple carbohydrates) and exercise (at least 30 minutes of moderate physical activity daily).  Educational handout given for glossitis  The above assessment and management plan was discussed with the patient. The patient verbalized understanding of and has agreed to the management plan. Patient is aware to call the clinic if they develop any new symptoms or if symptoms persist or worsen. Patient is aware when to return to the clinic for a follow-up visit. Patient educated on when it is appropriate to go to the emergency department.   Monia Pouch, FNP-C [facility] [phone]

## 2022-01-10 LAB — VITAMIN B12: Vitamin B-12: 430 pg/mL (ref 232–1245)

## 2022-01-10 LAB — FOLATE: Folate: >20 ng/mL (ref >3.0)

## 2022-01-14 ENCOUNTER — Telehealth: Payer: Self-pay | Admitting: Family Medicine

## 2022-01-14 NOTE — Telephone Encounter (Signed)
Patient has questions about an allergy medication. Please call back and advise

## 2022-01-14 NOTE — Telephone Encounter (Signed)
Patient was unsure whether she should continue the Xyzel or switch to the Zyrtec prescribed by Sharyn Lull.  Patient advised that per Michelle's notes she should discontinue the Xyzal and start the Zyrtec.

## 2022-01-16 ENCOUNTER — Ambulatory Visit: Payer: Medicare HMO | Attending: Cardiology

## 2022-01-16 DIAGNOSIS — I050 Rheumatic mitral stenosis: Secondary | ICD-10-CM | POA: Diagnosis not present

## 2022-01-16 DIAGNOSIS — I429 Cardiomyopathy, unspecified: Secondary | ICD-10-CM

## 2022-01-17 ENCOUNTER — Ambulatory Visit
Admission: EM | Admit: 2022-01-17 | Discharge: 2022-01-17 | Disposition: A | Discharge status: Home / Self Care | Payer: Medicare HMO | Attending: Nurse Practitioner | Admitting: Nurse Practitioner

## 2022-01-17 ENCOUNTER — Encounter: Payer: Self-pay | Admitting: Emergency Medicine

## 2022-01-17 DIAGNOSIS — E1159 Type 2 diabetes mellitus with other circulatory complications: Secondary | ICD-10-CM | POA: Diagnosis not present

## 2022-01-17 DIAGNOSIS — E785 Hyperlipidemia, unspecified: Secondary | ICD-10-CM

## 2022-01-17 DIAGNOSIS — K1370 Unspecified lesions of oral mucosa: Secondary | ICD-10-CM | POA: Diagnosis not present

## 2022-01-17 DIAGNOSIS — I10 Essential (primary) hypertension: Secondary | ICD-10-CM | POA: Diagnosis not present

## 2022-01-17 HISTORY — DX: Unspecified cirrhosis of liver: K74.60

## 2022-01-17 LAB — ECHOCARDIOGRAM COMPLETE
AR max vel: 2.04 cm2
AV Area VTI: 1.64 cm2
AV Area mean vel: 1.67 cm2
AV Mean grad: 6 mmHg
AV Peak grad: 8.2 mmHg
Ao pk vel: 1.43 m/s
Area-P 1/2: 2.76 cm2
Calc EF: 50.8 %
MV M vel: 4.43 m/s
MV Peak grad: 78.5 mmHg
P 1/2 time: 1162 msec
S' Lateral: 3.6 cm
Single Plane A2C EF: 52.2 %
Single Plane A4C EF: 49.9 %

## 2022-01-17 MED ORDER — NYSTATIN 100000 UNIT/ML MT SUSP: 10.0000 mL | Freq: Three times a day (TID) | OROMUCOSAL | 0 refills | Status: DC

## 2022-01-17 MED ORDER — LIDOCAINE VISCOUS HCL 2 % MT SOLN: 10.0000 mL | Freq: Four times a day (QID) | OROMUCOSAL | 0 refills | Status: DC | PRN

## 2022-01-17 NOTE — ED Provider Notes (Signed)
[facility]    CSN: [id number] Arrival date & time: 01/17/22  1222      History   Chief Complaint No chief complaint on file.   HPI Carol Richmond is a 72 y.o. female.   The history is provided by the patient.   Patient presents for complaints of mouth pain and soreness that been present for the last 2 weeks. Patient has been seen at a local urgent care and by her PCP.  She states that she was given mouthwash on 2 occasions, and also treated with a pill for yeast.  Patient denies any improvement of her symptoms at this time.  Patient reports that she continues to experience burning of her mouth and throat.  Patient reports that over the last 24 hours, her symptoms have worsened.  Patient reports that she does wear dentures, but she has been cleaning them appropriately since her symptoms started.  Pain worsens with eating and drinking.  She denies fever, chills, lip swelling, or tongue swelling.  Past Medical History:  Diagnosis Date   Asthma    Carotid artery disease ([place])    Cirrhosis ([place])    Diverticulosis of colon (without mention of hemorrhage)    Essential hypertension    Functional ovarian cysts    GERD (gastroesophageal reflux disease)    Hemorrhoids, external, without mention of complication    Hormone replacement therapy (postmenopausal)    IBS (irritable bowel syndrome)    Migraine    Mitral valve disease    Right knee DJD    Seizures (HCC)    Sleep apnea    TIA (transient ischemic attack)    Type 2 diabetes mellitus ([place])     Patient Active Problem List   Diagnosis Date Noted   S/P placement of cardiac pacemaker 12/28/2021   Heart block AV complete ([place]) 12/15/2021   LBBB (left bundle branch block) 12/15/2021   Controlled substance agreement signed 12/03/2021   Mitral stenosis with regurgitation 09/30/2021   Wide-complex tachycardia 09/30/2021   SVT (supraventricular tachycardia) 09/28/2021   Chronic HFrEF (heart failure with reduced  ejection fraction) ([place]) 09/28/2021   Moderate nonproliferative diabetic retinopathy of left eye, associated with type 2 diabetes mellitus ([place]) 09/20/2021   Posterior vitreous detachment of left eye 09/20/2021   Stable central retinal vein occlusion of right eye 09/20/2021   Hypertensive retinopathy of both eyes, grade 1 09/20/2021   Nuclear sclerotic cataract of left eye 09/20/2021   Benzodiazepine dependence ([place]) 05/15/2021   Angina, class III ([place]) 06/27/2020   Cirrhosis, nonalcoholic ([place]) 24/23/5361   Cervical spondylosis 06/07/2019   Peptic ulcer disease 01/25/2019   Iron deficiency anemia 01/25/2019   Fatty liver disease, nonalcoholic 44/31/5400   Cardiomyopathy ([place]) 08/07/2017   Non-rheumatic mitral regurgitation 08/07/2017   Non-rheumatic tricuspid valve insufficiency 08/07/2017   PAD (peripheral artery disease) ([place]) 08/07/2017   Controlled type 2 diabetes mellitus with hyperglycemia, without long-term current use of insulin ([place]) 08/07/2017   Carotid artery stenosis, symptomatic, right 07/01/2017   Stenosis of right carotid artery 06/23/2017   Orthostatic tremor 06/23/2017   Generalized anxiety disorder 07/01/2016   Mild neurocognitive disorder 05/02/2016   Migraine without aura and without status migrainosus, not intractable 01/27/2015   Depression, recurrent ([place]) 07/25/2014   Chronic daily headache 12/03/2013   Osteoarthritis of right knee 09/03/2012   Right knee DJD    IBS (irritable bowel syndrome)    Diverticulosis of colon (without mention of hemorrhage)    GERD (gastroesophageal reflux disease)  Hypertension associated with type 2 diabetes mellitus (HCC)    Partial seizure ([place])    Hemorrhoids, external, without mention of complication     Past Surgical History:  Procedure Laterality Date   BIOPSY  09/23/2018   Procedure: BIOPSY;  Surgeon: Rogene Houston, MD;  [facility];  Service: Endoscopy;;  duodeneum and stomach   CHOLECYSTECTOMY  1980    ENDARTERECTOMY Right 07/01/2017   Procedure: ENDARTERECTOMY CAROTID RIGHT;  Surgeon: Serafina Mitchell, MD;  Location: [facility];  Service: Vascular;  Laterality: Right;   ESOPHAGOGASTRODUODENOSCOPY N/A 09/23/2018   Procedure: ESOPHAGOGASTRODUODENOSCOPY (EGD);  Surgeon: Rogene Houston, MD;  [facility];  Service: Endoscopy;  Laterality: N/A;  2:45-moved to 1:45 per Lelon Frohlich   ESOPHAGOGASTRODUODENOSCOPY (EGD) WITH PROPOFOL N/A 03/07/2021   Procedure: ESOPHAGOGASTRODUODENOSCOPY (EGD) WITH PROPOFOL;  Surgeon: Rogene Houston, MD;  [facility];  Service: Endoscopy;  Laterality: N/A;  12:50   LEFT HEART CATHETERIZATION WITH CORONARY ANGIOGRAM N/A 11/18/2013   Procedure: LEFT HEART CATHETERIZATION WITH CORONARY ANGIOGRAM;  Surgeon: Burnell Blanks, MD;  Location: [facility] CATH LAB;  Service: Cardiovascular;  Laterality: N/A;   OOPHORECTOMY     2012 Dr. Adah Perl in [place] N/A 12/14/2021   Procedure: PACEMAKER IMPLANT;  Surgeon: Evans Lance, MD;  Location: [place] CV LAB;  Service: Cardiovascular;  Laterality: N/A;   PATCH ANGIOPLASTY Right 07/01/2017   Procedure: RIGHT CAROTID PATCH ANGIOPLASTY;  Surgeon: Serafina Mitchell, MD;  Location[facility];  Service: Vascular;  Laterality: Right;   SVT ABLATION N/A 12/14/2021   Procedure: SVT ABLATION;  Surgeon: Evans Lance, MD;  Location: [place] CV LAB;  Service: Cardiovascular;  Laterality: N/A;   TONSILLECTOMY     TOTAL KNEE ARTHROPLASTY Right 09/02/2012   Procedure: TOTAL KNEE ARTHROPLASTY- right;  Surgeon: Ninetta Lights, MD;  Location: [place];  Service: Orthopedics;  Laterality: Right;   TUBAL LIGATION     TYMPANOMASTOIDECTOMY Left 04/02/2016   Procedure: LEFT CANAL WALL DOWN TYMPANOMASTOIDECTOMY;  Surgeon: Leta Baptist, MD;  Location: [facility];  Service: ENT;  Laterality: Left;   [place] Right 07/02/2017   Procedure: RIGHT NECK EXPLORATION WITH INTRAOPERATIVE  ULTRASOUND;  Surgeon: Angelia Mould, MD;  Location: [facility];  Service: Vascular;  Laterality: Right;    OB History   No obstetric history on file.      Home Medications    Prior to Admission medications   Medication Sig Start Date End Date Taking? Authorizing Provider  lidocaine (XYLOCAINE) 2 % solution Use as directed 10 mLs in the mouth or throat every 6 (six) hours as needed for mouth pain. 01/17/22  Yes Darwyn Ponzo-Warren, Alda Lea, NP  magic mouthwash (nystatin, hydrocortisone, diphenhydrAMINE) suspension Swish and spit 10 mLs 3 (three) times daily. 01/17/22  Yes Shoni Quijas-Warren, Alda Lea, NP  Accu-Chek Softclix Lancets lancets check blood sugars twice daily DX E11.65 11/28/21   Gwenlyn Perking, FNP  acetaminophen (TYLENOL) 500 MG tablet Take 500 mg by mouth every 6 (six) hours as needed for mild pain.    [provider]  aspirin EC 81 MG tablet Take 1 tablet (81 mg total) by mouth daily. 09/24/18   Rehman, Mechele Dawley, MD  benzonatate (TESSALON) 200 MG capsule Take 1 capsule (200 mg total) by mouth 3 (three) times daily as needed. 11/15/21   Dettinger, Fransisca Kaufmann, MD  carvedilol (COREG) 3.125 MG tablet TAKE (1) TABLET  TWICE DAILY. 12/31/21   Satira Sark, MD  cetirizine (ZYRTEC) 10 MG tablet Take 1 tablet (10 mg total) by mouth daily. 01/09/22   Baruch Gouty, FNP  cholecalciferol (VITAMIN D) 1000 UNITS tablet Take 1,000 Units by mouth daily.    [provider]  empagliflozin (JARDIANCE) 10 MG TABS tablet Take 1 tablet (10 mg total) by mouth daily before breakfast. Patient taking differently: Take 20 mg by mouth daily before breakfast. 11/22/21   Satira Sark, MD  fluticasone [facility]) 50 MCG/ACT nasal spray Place 2 sprays into both nostrils as needed for allergies or rhinitis.    [provider]  furosemide (LASIX) 40 MG tablet Take 1 tablet (40 mg total) by mouth daily. 11/21/21   Strader, Fransisco Hertz, PA-C  gabapentin (NEURONTIN) 300 MG capsule Take  300 mg by mouth 3 (three) times daily as needed (Nerve in throat). 01/24/20   [provider]  glucose blood (ACCU-CHEK GUIDE) test strip Use as instructed to test blood sugar daily as directed. DX:E11.9 12/05/21   Gwenlyn Perking, FNP  HYDROcodone-acetaminophen (NORCO) 10-325 MG tablet Take 1 tablet by mouth every 6 (six) hours as needed for severe pain (Arthritis on neck).    [provider]  lactulose (CHRONULAC) 10 GM/15ML solution TAKE 30 MLS (20 GRAM TOTAL) BY MOUTH 3 TIMES A DAY. 07/24/21   Rehman, Mechele Dawley, MD  lamoTRIgine (LAMICTAL) 200 MG tablet TAKE (1) TABLET TWICE DAILY. 12/24/21   Gwenlyn Perking, FNP  LORazepam (ATIVAN) 0.5 MG tablet Take 1 tablet (0.5 mg total) by mouth 2 (two) times daily as needed for anxiety. 12/03/21   Gwenlyn Perking, FNP  magic mouthwash (nystatin, lidocaine, diphenhydrAMINE, alum & mag hydroxide) suspension Swish and swallow 5 mLs 3 (three) times daily. 01/09/22   Baruch Gouty, FNP  Multiple Vitamins-Minerals (MULTIVITAMIN WITH MINERALS) tablet Take 1 tablet by mouth daily.    [provider]  naloxone [facility]) nasal spray 4 mg/0.1 mL Place 1 spray into the nose once. Take by nasal route every 3 minutes until patient awakes or EMS arrives. 07/19/20   [provider]  ondansetron (ZOFRAN) 4 MG tablet Take 1 tablet (4 mg total) by mouth every 8 (eight) hours as needed for nausea or vomiting. 10/11/21   Gwenlyn Perking, FNP  pantoprazole (PROTONIX) 40 MG tablet TAKE (1) TABLET TWICE DAILY. 12/05/21   Carlan, Chelsea L, NP  potassium chloride SA (KLOR-CON M) 20 MEQ tablet Take 2 tablets (40 mEq total) by mouth daily. 09/30/21   Jani Gravel, MD  sacubitril-valsartan (ENTRESTO) 24-26 MG Take 1 tablet by mouth 2 (two) times daily. 01/03/22   Satira Sark, MD    Family History Family History  Problem Relation Age of Onset   Heart disease Mother    Diabetes Mother    Heart disease Brother    Cancer Sister    Heart disease  Brother    Heart disease Brother    Heart disease Son     Social History Social History   Tobacco Use   Smoking status: Never   Smokeless tobacco: Never  Vaping Use   Vaping Use: Never used  Substance Use Topics   Alcohol use: No    Alcohol/week: 0.0 standard drinks of alcohol   Drug use: No     Allergies   Patient has no known allergies.   Review of Systems Review of Systems Per HPI  Physical Exam Triage Vital Signs ED Triage Vitals  Enc  Vitals Group     BP 01/17/22 1228 102/67     Pulse Rate 01/17/22 1228 92     Resp 01/17/22 1228 18     Temp 01/17/22 1228 97.9 F (36.6 C)     Temp Source 01/17/22 1228 Oral     SpO2 01/17/22 1228 95 %     Weight --      Height --      Head Circumference --      Peak Flow --      Pain Score 01/17/22 1230 8     Pain Loc --      Pain Edu? --      Excl. in [place]? --    No data found.  Updated Vital Signs BP 102/67 (BP Location: Right Arm)   Pulse 92   Temp 97.9 F (36.6 C) (Oral)   Resp 18   SpO2 95%   Visual Acuity Right Eye Distance:   Left Eye Distance:   Bilateral Distance:    Right Eye Near:   Left Eye Near:    Bilateral Near:     Physical Exam Vitals and nursing note reviewed.  Constitutional:      General: She is not in acute distress.    Appearance: Normal appearance.  HENT:     Head: Normocephalic.     Nose: Nose normal.     Mouth/Throat:     Mouth: Mucous membranes are moist. Oral lesions present.     Dentition: Has dentures.     Tongue: No lesions. Tongue does not deviate from midline.     Pharynx: Uvula midline. No uvula swelling.     Comments: Cobblestoning present on posterior tongue.  Eyes:     Extraocular Movements: Extraocular movements intact.     Pupils: Pupils are equal, round, and reactive to light.  Cardiovascular:     Rate and Rhythm: Normal rate and regular rhythm.     Pulses: Normal pulses.     Heart sounds: Normal heart sounds.  Pulmonary:     Effort: Pulmonary effort is  normal.     Breath sounds: Normal breath sounds.  Abdominal:     General: Bowel sounds are normal.     Palpations: Abdomen is soft.  Musculoskeletal:     Cervical back: Normal range of motion.  Skin:    General: Skin is warm and dry.  Neurological:     General: No focal deficit present.     Mental Status: She is alert and oriented to person, place, and time.  Psychiatric:        Mood and Affect: Mood normal.        Behavior: Behavior normal.      UC Treatments / Results  Labs (all labs ordered are listed, but only abnormal results are displayed) Labs Reviewed - No data to display  EKG   Radiology Procedures Procedures (including critical care time)  Medications Ordered in UC Medications - No data to display  Initial Impression / Assessment and Plan / UC Course  I have reviewed the triage vital signs and the nursing notes.  Pertinent labs & imaging results that were available during my care of the patient were reviewed by me and considered in my medical decision making (see chart for details).  Patient presents for complaints of mouth and throat burning that has been present for the last several days.  On exam, patient is well-appearing, she is in no acute distress, vital signs are stable.  Cannot appreciate noticeable  inflammation of the patient's oropharynx or throat.  However, do not discount that she is experiencing symptoms.  Difficult to ascertain the cause of her symptoms.  Based on this presentation, will prescribe patient Magic mouthwash, but will have hydrocortisone added to her medication.  Will also prescribe the patient viscous lidocaine 2% for continued mouth pain.  Supportive care recommendations were provided to the patient to include warm salt water gargles, recommend that she may add a little bit of peroxide to the solution to see if this helps with her symptoms.  Also recommended good dental hygiene.  Patient advised that if symptoms do not improve with these  medications, recommend that she follow-up with her primary care physician for further evaluation.  Patient verbalizes understanding.  All questions were answered.  Patient is stable for discharge. Final Clinical Impressions(s) / UC Diagnoses   Final diagnoses:  Unspecified lesions of oral mucosa     Discharge Instructions      Use medication as prescribed. As discussed, I recommend that you decrease wearing your dentures while symptoms persist. May also gargle and warm salt water gargles with added peroxide to see if this helps with your symptoms.  I would add a tablespoon of peroxide to 1/2 glass of warm salt water and gargle and spit. Continue good dental hygiene while symptoms persist. If your symptoms do not improve with this treatment, please follow-up with your primary care physician for further evaluation. Follow-up as needed.     ED Prescriptions     Medication Sig Dispense Auth. Provider   magic mouthwash (nystatin, hydrocortisone, diphenhydrAMINE) suspension Swish and spit 10 mLs 3 (three) times daily. 540 mL Kriss Ishler-Warren, Alda Lea, NP   lidocaine (XYLOCAINE) 2 % solution Use as directed 10 mLs in the mouth or throat every 6 (six) hours as needed for mouth pain. 100 mL Rin Gorton-Warren, Alda Lea, NP      I have reviewed the PDMP during this encounter.   Tish Men, NP 01/17/22 1942

## 2022-01-17 NOTE — Discharge Instructions (Addendum)
Use medication as prescribed. As discussed, I recommend that you decrease wearing your dentures while symptoms persist. May also gargle and warm salt water gargles with added peroxide to see if this helps with your symptoms.  I would add a tablespoon of peroxide to 1/2 glass of warm salt water and gargle and spit. Continue good dental hygiene while symptoms persist. If your symptoms do not improve with this treatment, please follow-up with your primary care physician for further evaluation. Follow-up as needed.

## 2022-01-17 NOTE — ED Triage Notes (Signed)
Mouth pain and tongue pain since the wednesday before thanksgiving.  Saw PCP and was given mouth wash to use with no relief.

## 2022-01-21 ENCOUNTER — Other Ambulatory Visit: Payer: Self-pay | Admitting: Cardiology

## 2022-01-21 ENCOUNTER — Encounter: Payer: Self-pay | Admitting: *Deleted

## 2022-01-21 ENCOUNTER — Ambulatory Visit: Payer: Medicare HMO | Attending: Cardiology | Admitting: Cardiology

## 2022-01-21 ENCOUNTER — Other Ambulatory Visit: Payer: Self-pay | Admitting: Family Medicine

## 2022-01-21 ENCOUNTER — Telehealth: Payer: Self-pay | Admitting: Cardiology

## 2022-01-21 ENCOUNTER — Encounter: Payer: Self-pay | Admitting: Cardiology

## 2022-01-21 ENCOUNTER — Telehealth: Payer: Self-pay | Admitting: *Deleted

## 2022-01-21 VITALS — BP 116/70 | HR 85 | Ht 64.0 in | Wt 169.2 lb

## 2022-01-21 DIAGNOSIS — I4719 Other supraventricular tachycardia: Secondary | ICD-10-CM

## 2022-01-21 DIAGNOSIS — I050 Rheumatic mitral stenosis: Secondary | ICD-10-CM

## 2022-01-21 DIAGNOSIS — R110 Nausea: Secondary | ICD-10-CM

## 2022-01-21 DIAGNOSIS — I5022 Chronic systolic (congestive) heart failure: Secondary | ICD-10-CM

## 2022-01-21 DIAGNOSIS — R569 Unspecified convulsions: Secondary | ICD-10-CM

## 2022-01-21 NOTE — Patient Instructions (Addendum)
Medication Instructions:  Your physician recommends that you continue on your current medications as directed. Please refer to the Current Medication list given to you today.  Labwork: none  Testing/Procedures: Your physician has requested that you have a TEE. During a TEE, sound waves are used to create images of your heart. It provides your doctor with information about the size and shape of your heart and how well your heart's chambers and valves are working. In this test, a transducer is attached to the end of a flexible tube that's guided down your throat and into your esophagus (the tube leading from you mouth to your stomach) to get a more detailed image of your heart. You are not awake for the procedure. Please see the instruction sheet given to you today. For further information please visit [email or web address].   Follow-Up: Your physician recommends that you schedule a follow-up appointment in: 3 months  Any Other Special Instructions Will Be Listed Below (If Applicable).  If you need a refill on your cardiac medications before your next appointment, please call your pharmacy.

## 2022-01-21 NOTE — Telephone Encounter (Signed)
Patient says she can't go on 02/05/2022 for TEE but can go on 02/06/2022.  TEE rescheduled for 02/06/2022 @10 :00 am with Nishan.

## 2022-01-21 NOTE — Progress Notes (Signed)
Cardiology Office Note  Date: 01/21/2022   ID: Carol Richmond, Carol Richmond 09/26/1950, MRN [id number]  PCP:  Gwenlyn Perking, FNP  Cardiologist:  Rozann Lesches, MD Electrophysiologist:  None   Chief Complaint  Patient presents with   Cardiac follow-up    History of Present Illness: Carol Richmond is a 71 y.o. female last seen in October by Ms. Strader PA-C, I reviewed the note.  She is here for a routine visit.  Reports NYHA class II dyspnea with basic activities.  Has been going to [employer] a few days a week, walks slowly on the treadmill.  She is still also grieving the loss of her husband.  Does have support from daughters in town.  She did see Dr. Lovena Le since our last evaluation, underwent ablation of AVNRT with subsequent transient complete heart block and ultimately placement of Medtronic dual-chamber pacemaker.  Feels much better in terms of palpitations.  Recent echocardiogram revealed LVEF 40 to 45% showing some improvement, rheumatic mitral valve disease with moderate to severe stenosis and moderate to severe regurgitation, mildly dilated left atrium, normal estimated RVSP.  We discussed her medications and also plan to proceed with TEE for further assessment of her rheumatic mitral valve disease.  Timing of surgical intervention is the main question.  Past Medical History:  Diagnosis Date   Asthma    AVNRT (AV nodal re-entry tachycardia)    Carotid artery disease (HCC)    Cirrhosis (HCC)    Diverticulosis of colon (without mention of hemorrhage)    Essential hypertension    Functional ovarian cysts    GERD (gastroesophageal reflux disease)    Hemorrhoids, external, without mention of complication    Hormone replacement therapy (postmenopausal)    IBS (irritable bowel syndrome)    Migraine    Mitral valve disease    Pacemaker    Complete heart block - Medtronic   Right knee DJD    Seizures (HCC)    Sleep apnea    TIA (transient ischemic attack)     Type 2 diabetes mellitus ([place])     Past Surgical History:  Procedure Laterality Date   BIOPSY  09/23/2018   Procedure: BIOPSY;  Surgeon: Rogene Houston, MD;  [facility]: [facility];  Service: Endoscopy;;  duodeneum and stomach   CHOLECYSTECTOMY  1980   ENDARTERECTOMY Right 07/01/2017   Procedure: ENDARTERECTOMY CAROTID RIGHT;  Surgeon: Serafina Mitchell, MD;  Location: [facility] OR;  Service: Vascular;  Laterality: Right;   ESOPHAGOGASTRODUODENOSCOPY N/A 09/23/2018   Procedure: ESOPHAGOGASTRODUODENOSCOPY (EGD);  Surgeon: Rogene Houston, MD;  [facility]: [facility];  Service: Endoscopy;  Laterality: N/A;  2:45-moved to 1:45 per Lelon Frohlich   ESOPHAGOGASTRODUODENOSCOPY (EGD) WITH PROPOFOL N/A 03/07/2021   Procedure: ESOPHAGOGASTRODUODENOSCOPY (EGD) WITH PROPOFOL;  Surgeon: Rogene Houston, MD;  [facility]: [facility];  Service: Endoscopy;  Laterality: N/A;  12:50   LEFT HEART CATHETERIZATION WITH CORONARY ANGIOGRAM N/A 11/18/2013   Procedure: LEFT HEART CATHETERIZATION WITH CORONARY ANGIOGRAM;  Surgeon: Burnell Blanks, MD;  Location: [facility] CATH LAB;  Service: Cardiovascular;  Laterality: N/A;   OOPHORECTOMY     2012 Dr. Adah Perl in [place] N/A 12/14/2021   Procedure: PACEMAKER IMPLANT;  Surgeon: Evans Lance, MD;  Location: [place] CV LAB;  Service: Cardiovascular;  Laterality: N/A;   PATCH ANGIOPLASTY Right 07/01/2017   Procedure: RIGHT CAROTID PATCH ANGIOPLASTY;  Surgeon: Serafina Mitchell, MD;  Location: [place];  Service: Vascular;  Laterality: Right;  SVT ABLATION N/A 12/14/2021   Procedure: SVT ABLATION;  Surgeon: Evans Lance, MD;  Location: [place] CV LAB;  Service: Cardiovascular;  Laterality: N/A;   TONSILLECTOMY     TOTAL KNEE ARTHROPLASTY Right 09/02/2012   Procedure: TOTAL KNEE ARTHROPLASTY- right;  Surgeon: Ninetta Lights, MD;  Location: [place];  Service: Orthopedics;  Laterality: Right;   TUBAL LIGATION     TYMPANOMASTOIDECTOMY Left 04/02/2016   Procedure:  LEFT CANAL WALL DOWN TYMPANOMASTOIDECTOMY;  Surgeon: Leta Baptist, MD;  Location: [place];  Service: ENT;  Laterality: Left;   [place] Right 07/02/2017   Procedure: RIGHT NECK EXPLORATION WITH INTRAOPERATIVE ULTRASOUND;  Surgeon: Angelia Mould, MD;  Location: [facility] OR;  Service: Vascular;  Laterality: Right;    Current Outpatient Medications  Medication Sig Dispense Refill   Accu-Chek Softclix Lancets lancets check blood sugars twice daily DX E11.65 200 each 3   acetaminophen (TYLENOL) 500 MG tablet Take 500 mg by mouth every 6 (six) hours as needed for mild pain.     aspirin EC 81 MG tablet Take 1 tablet (81 mg total) by mouth daily.     benzonatate (TESSALON) 200 MG capsule Take 1 capsule (200 mg total) by mouth 3 (three) times daily as needed. 30 capsule 3   carvedilol (COREG) 3.125 MG tablet TAKE (1) TABLET TWICE DAILY. 60 tablet 6   cetirizine (ZYRTEC) 10 MG tablet Take 1 tablet (10 mg total) by mouth daily. 30 tablet 11   cholecalciferol (VITAMIN D) 1000 UNITS tablet Take 1,000 Units by mouth daily.     empagliflozin (JARDIANCE) 10 MG TABS tablet Take 1 tablet (10 mg total) by mouth daily before breakfast. (Patient taking differently: Take 20 mg by mouth daily before breakfast.) 90 tablet 3   fluticasone (FLONASE) 50 MCG/ACT nasal spray Place 2 sprays into both nostrils as needed for allergies or rhinitis.     furosemide (LASIX) 40 MG tablet Take 1 tablet (40 mg total) by mouth daily. 90 tablet 3   gabapentin (NEURONTIN) 300 MG capsule Take 300 mg by mouth 3 (three) times daily as needed (Nerve in throat).     glucose blood (ACCU-CHEK GUIDE) test strip Use as instructed to test blood sugar daily as directed. DX:E11.9 50 each 12   HYDROcodone-acetaminophen (NORCO) 10-325 MG tablet Take 1 tablet by mouth every 6 (six) hours as needed for severe pain (Arthritis on neck).     lactulose (CHRONULAC) 10 GM/15ML solution TAKE 30 MLS (20 GRAM  TOTAL) BY MOUTH 3 TIMES A DAY. 2700 mL 5   lamoTRIgine (LAMICTAL) 200 MG tablet TAKE (1) TABLET TWICE DAILY. 180 tablet 3   lidocaine (XYLOCAINE) 2 % solution Use as directed 10 mLs in the mouth or throat every 6 (six) hours as needed for mouth pain. 100 mL 0   LORazepam (ATIVAN) 0.5 MG tablet Take 1 tablet (0.5 mg total) by mouth 2 (two) times daily as needed for anxiety. 30 tablet 5   magic mouthwash (nystatin, hydrocortisone, diphenhydrAMINE) suspension Swish and spit 10 mLs 3 (three) times daily. 540 mL 0   magic mouthwash (nystatin, lidocaine, diphenhydrAMINE, alum & mag hydroxide) suspension Swish and swallow 5 mLs 3 (three) times daily. 180 mL 0   Multiple Vitamins-Minerals (MULTIVITAMIN WITH MINERALS) tablet Take 1 tablet by mouth daily.     naloxone (NARCAN) nasal spray 4 mg/0.1 mL Place 1 spray into the nose once. Take by nasal route every 3 minutes until  patient awakes or EMS arrives.     ondansetron (ZOFRAN) 4 MG tablet TAKE 1 TABLET BY MOUTH EVERY 8 HOURS AS NEEDED FOR NAUSEA AND VOMITING. 20 tablet 0   pantoprazole (PROTONIX) 40 MG tablet TAKE (1) TABLET TWICE DAILY. 60 tablet 5   potassium chloride SA (KLOR-CON M) 20 MEQ tablet Take 2 tablets (40 mEq total) by mouth daily. 90 tablet 3   sacubitril-valsartan (ENTRESTO) 24-26 MG Take 1 tablet by mouth 2 (two) times daily. 180 tablet 3   No current facility-administered medications for this visit.   Allergies:  Patient has no known allergies.   Social History: The patient  reports that she has never smoked. She has never used smokeless tobacco. She reports that she does not drink alcohol and does not use drugs.   Family History: The patient's family history includes Cancer in her sister; Diabetes in her mother; Heart disease in her brother, brother, brother, mother, and son.   ROS: No palpitations or syncope.  Physical Exam: VS:  BP 116/70   Pulse 85   Ht 5' 4"  (1.626 m)   Wt 169 lb 3.2 oz (76.7 kg)   SpO2 96%   BMI 29.04  kg/m , BMI Body mass index is 29.04 kg/m.  Wt Readings from Last 3 Encounters:  01/21/22 169 lb 3.2 oz (76.7 kg)  01/09/22 168 lb 6.4 oz (76.4 kg)  12/28/21 168 lb (76.2 kg)    General: Patient appears comfortable at rest. HEENT: Conjunctiva and lids normal. Neck: Supple, no elevated JVP or carotid bruits. Lungs: Clear to auscultation, nonlabored breathing at rest. Cardiac: Regular rate and rhythm, no S3, 2/6 systolic murmur and soft apical diastolic rumble, no pericardial rub. Abdomen: Soft, nontender, bowel sounds present. Extremities: No pitting edema, distal pulses 2+. Skin: Warm and dry. Musculoskeletal: No kyphosis. Neuropsychiatric: Alert and oriented x3, affect grossly appropriate.  ECG:  An ECG dated 10/10/2021 was personally reviewed today and demonstrated:  Ectopic atrial rhythm with left bundle branch block.  Recent Labwork: 09/28/2021: B Natriuretic Peptide 1,025.0 09/29/2021: Magnesium 1.9; TSH 1.066 10/18/2021: ALT 19; AST 32 11/19/2021: BUN 13; Creatinine, Ser 0.96; Potassium 4.2; Sodium 138 12/14/2021: Hemoglobin 13.3; Platelets 163     Component Value Date/Time   CHOL 213 (H) 09/12/2021 0927   TRIG 380 (H) 09/12/2021 0927   HDL 47 09/12/2021 0927   CHOLHDL 4.5 (H) 09/12/2021 0927   CHOLHDL 5.2 05/08/2007 0530   VLDL 38 05/08/2007 0530   LDLCALC 102 (H) 09/12/2021 0927    Other Studies Reviewed Today:  Echocardiogram 01/16/2022:  1. Left ventricular ejection fraction, by estimation, is 40 to 45%. The  left ventricle has mildly decreased function. Left ventricular endocardial  border not optimally defined to evaluate regional wall motion. Left  ventricular diastolic parameters are  consistent with Grade I diastolic dysfunction (impaired relaxation).  Elevated left atrial pressure. The average left ventricular global  longitudinal strain is -15.5 %. The global longitudinal strain is  abnormal.   2. Right ventricular systolic function is normal. The right  ventricular  size is normal. Tricuspid regurgitation signal is inadequate for assessing  PA pressure.   3. Left atrial size was mildly dilated.   4. MR jet is posterior and eccentric which makes quantification  difficult. MV/AV VTI ratio is 1.5 suggesting moderate to severe MR. . The  mitral valve is abnormal. Moderate to severe mitral valve regurgitation.  Moderate to severe mitral stenosis. The  mean mitral valve gradient is 9.0 mmHg. Heart rate of  80 bpm.   5. The aortic valve has an indeterminant number of cusps. There is mild  calcification of the aortic valve. There is mild thickening of the aortic  valve. Aortic valve regurgitation is mild. No aortic stenosis is present.   6. The inferior vena cava is normal in size with greater than 50%  respiratory variability, suggesting right atrial pressure of 3 mmHg.   Assessment and Plan:  1.  Rheumatic mitral valve disease, stage D with moderate to severe mitral stenosis (mean gradient 9 mmHg at heart rate 80 bpm, PHT not corroborating significant stenosis however) and moderate to severe eccentric mitral regurgitation.  LVEF 40 to 45% on modified GDMT which does show some improvement.  We have discussed proceeding with a TEE to get further information.  She would not be a candidate for balloon valvuloplasty due to degree of mitral regurgitation and therefore timing and her candidacy for valve replacement is the main question.  Plan to set TEE up in [place] so that 3D imaging can be obtained.  2. HFmrEF with LVEF approximately 45% by most recent echocardiogram.  Likely mixed picture etiology in the setting of rheumatic mitral valve disease, also possibly tachycardia induced but now status post AVNRT ablation.  Plan has been modifying GDMT during further work-up of her mitral valve.  Weight is stable and she continues on Coreg, Jardiance, Lasix with potassium supplement, and Entresto.  Holding off on further adjustments pending above work-up.  3.   AVNRT status post radiofrequency ablation by Dr. Lovena Le.  Subsequent transient complete heart block noted and she is now also status post Medtronic dual-chamber pacemaker.  Medication Adjustments/Labs and Tests Ordered: Current medicines are reviewed at length with the patient today.  Concerns regarding medicines are outlined above.   Tests Ordered: No orders of the defined types were placed in this encounter.   Medication Changes: No orders of the defined types were placed in this encounter.   Disposition:  Follow up  after procedure.  Signed, Satira Sark, MD, [facility] 01/21/2022 11:43 AM    [facility] [place], [place], [place] [address] Phone: [phone]; Fax: [phone]

## 2022-01-21 NOTE — Telephone Encounter (Addendum)
PERCERT:  TEE dx: rheumatic mitral valve disease TEE 02/06/2022 @10 :00 am Dr. Harrell Gave booking [phone]

## 2022-01-22 ENCOUNTER — Telehealth: Payer: Self-pay | Admitting: Cardiovascular Disease

## 2022-01-22 NOTE — Telephone Encounter (Signed)
Pt's daughter is requesting call back to discuss the procedure that is scheduled for 12/20.

## 2022-01-23 ENCOUNTER — Other Ambulatory Visit: Payer: Medicare HMO

## 2022-01-23 NOTE — Telephone Encounter (Signed)
Daughter wanting to know more about TEE. Advised its a more invasive echocardiogram where a light is goes into valves of heart to get better image of anatomy and condition. Verbalized understanding.

## 2022-01-23 NOTE — Telephone Encounter (Signed)
Pt's daughter is calling back to requesting call back after 4 P.M. today to discuss procedure.

## 2022-01-24 ENCOUNTER — Telehealth: Payer: Self-pay | Admitting: Cardiology

## 2022-01-24 NOTE — Telephone Encounter (Signed)
Says she has been having problems with thrush in her mouth and throat and is currently using a mouth wash rinse for it. Asking if she can still do her TEE on 02/06/22. Advised that she can still have her TEE. Verbalized understanding.

## 2022-01-24 NOTE — Telephone Encounter (Signed)
Patient stated she has some questions regarding her upcoming procedure.

## 2022-01-28 NOTE — Telephone Encounter (Signed)
Patient concerned that glossitis won't be healed before TEE and wants to know if she needed to cancel.  Currently has f/u appointment with PCP tomorrow for throat problem. Advised that she is okay to proceed with TEE. Verbalized understanding.

## 2022-01-28 NOTE — Telephone Encounter (Signed)
Patient stated that her throat is hurting very badly.  Patient would like to speak with Dr. Domenic Polite directly.

## 2022-01-29 ENCOUNTER — Ambulatory Visit (INDEPENDENT_AMBULATORY_CARE_PROVIDER_SITE_OTHER): Payer: Medicare HMO | Admitting: Family Medicine

## 2022-01-29 ENCOUNTER — Encounter: Payer: Self-pay | Admitting: Family Medicine

## 2022-01-29 VITALS — BP 109/71 | HR 95 | Temp 95.0°F | Ht 64.0 in | Wt 166.2 lb

## 2022-01-29 DIAGNOSIS — K136 Irritative hyperplasia of oral mucosa: Secondary | ICD-10-CM | POA: Diagnosis not present

## 2022-01-29 DIAGNOSIS — R591 Generalized enlarged lymph nodes: Secondary | ICD-10-CM

## 2022-01-29 MED ORDER — AMOXICILLIN-POT CLAVULANATE 875-125 MG PO TABS: 1.0000 | ORAL_TABLET | Freq: Two times a day (BID) | ORAL | 0 refills | Status: AC

## 2022-01-29 NOTE — Progress Notes (Signed)
Subjective:  [name], female    DOB: September 21, 1950, 71 y.o.   MRN: [id number]  Patient Care Team: Gwenlyn Perking, FNP as PCP - General (Family Medicine) Satira Sark, MD as PCP - Cardiology (Cardiology) Cameron Sprang, MD as Consulting Physician (Neurology) Rogene Houston, MD as Consulting Physician (Gastroenterology) Leticia Clas, [place] (Optometry) Angelia Mould, MD as Consulting Physician (Vascular Surgery) Lavera Guise, [facility] as Pharmacist (Family Medicine) Kassie Mends, RN as [place] Management   Chief Complaint:  Follow-up (Urgent care follow up- 01/17/22- unspecified lesions of oral mucosa- patient states the pain is no better)   HPI: Carol Richmond is a 71 y.o. female presenting on 01/29/2022 for Follow-up (Urgent care follow up- 01/17/22- unspecified lesions of oral mucosa- patient states the pain is no better)   Pt presents today for persistent oral burning. Has been seen by UC, ENT, and here. She has been treated with oral rinses with antifungals. No improvement in symptoms. Vit B12 and folate were normal. States mouthwash will relieve the burning for a few minutes and then it returns. States she now has swollen lymph nodes in her neck. No fever, chills, weakness, confusion, decreased urine output, or weight changes.     Relevant past medical, surgical, family, and social history reviewed and updated as indicated.  Allergies and medications reviewed and updated. Data reviewed: Chart in Epic.   Past Medical History:  Diagnosis Date   Asthma    AVNRT (AV nodal re-entry tachycardia)    Carotid artery disease (HCC)    Cirrhosis (HCC)    Diverticulosis of colon (without mention of hemorrhage)    Essential hypertension    Functional ovarian cysts    GERD (gastroesophageal reflux disease)    Hemorrhoids, external, without mention of complication    Hormone replacement therapy (postmenopausal)     IBS (irritable bowel syndrome)    Migraine    Mitral valve disease    Pacemaker    Complete heart block - [employer]   Right knee DJD    Seizures (HCC)    Sleep apnea    TIA (transient ischemic attack)    Type 2 diabetes mellitus ([place])     Past Surgical History:  Procedure Laterality Date   BIOPSY  09/23/2018   Procedure: BIOPSY;  Surgeon: Rogene Houston, MD;  Location: [facility];  Service: Endoscopy;;  duodeneum and stomach   CHOLECYSTECTOMY  1980   ENDARTERECTOMY Right 07/01/2017   Procedure: ENDARTERECTOMY CAROTID RIGHT;  Surgeon: Serafina Mitchell, MD;  Location: [facility] OR;  Service: Vascular;  Laterality: Right;   ESOPHAGOGASTRODUODENOSCOPY N/A 09/23/2018   Procedure: ESOPHAGOGASTRODUODENOSCOPY (EGD);  Surgeon: Rogene Houston, MD;  Location: [facility];  Service: Endoscopy;  Laterality: N/A;  2:45-moved to 1:45 per Lelon Frohlich   ESOPHAGOGASTRODUODENOSCOPY (EGD) WITH PROPOFOL N/A 03/07/2021   Procedure: ESOPHAGOGASTRODUODENOSCOPY (EGD) WITH PROPOFOL;  Surgeon: Rogene Houston, MD;  Location: [facility];  Service: Endoscopy;  Laterality: N/A;  12:50   LEFT HEART CATHETERIZATION WITH CORONARY ANGIOGRAM N/A 11/18/2013   Procedure: LEFT HEART CATHETERIZATION WITH CORONARY ANGIOGRAM;  Surgeon: Burnell Blanks, MD;  Location: [facility] CATH LAB;  Service: Cardiovascular;  Laterality: N/A;   OOPHORECTOMY     2012 Dr. Adah Perl in [place] N/A 12/14/2021   Procedure: PACEMAKER IMPLANT;  Surgeon: Evans Lance, MD;  Location: [place] CV LAB;  Service: Cardiovascular;  Laterality: N/A;   PATCH  ANGIOPLASTY Right 07/01/2017   Procedure: RIGHT CAROTID PATCH ANGIOPLASTY;  Surgeon: Serafina Mitchell, MD;  Location: [facility] OR;  Service: Vascular;  Laterality: Right;   SVT ABLATION N/A 12/14/2021   Procedure: SVT ABLATION;  Surgeon: Evans Lance, MD;  Location: [place] CV LAB;  Service: Cardiovascular;  Laterality: N/A;   TONSILLECTOMY     TOTAL KNEE ARTHROPLASTY Right 09/02/2012    Procedure: TOTAL KNEE ARTHROPLASTY- right;  Surgeon: Ninetta Lights, MD;  Location: [place];  Service: Orthopedics;  Laterality: Right;   TUBAL LIGATION     TYMPANOMASTOIDECTOMY Left 04/02/2016   Procedure: LEFT CANAL WALL DOWN TYMPANOMASTOIDECTOMY;  Surgeon: Leta Baptist, MD;  Location: [place];  Service: ENT;  Laterality: Left;   [place] Right 07/02/2017   Procedure: RIGHT NECK EXPLORATION WITH INTRAOPERATIVE ULTRASOUND;  Surgeon: Angelia Mould, MD;  [facility];  Service: Vascular;  Laterality: Right;    Social History   Socioeconomic History   Marital status: Widowed    Spouse name: Not on file   Number of children: 3   Years of education: Not on file   Highest education level: Not on file  Occupational History   Occupation: retired  Tobacco Use   Smoking status: Never   Smokeless tobacco: Never  Vaping Use   Vaping Use: Never used  Substance and Sexual Activity   Alcohol use: No    Alcohol/week: 0.0 standard drinks of alcohol   Drug use: No   Sexual activity: Not Currently  Other Topics Concern   Not on file  Social History Narrative   Lives alone. Granddaughter lives across the street, her 3 children all live nearby   Social Determinants of Health   Financial Resource Strain: Low Risk  (01/04/2022)   Overall Financial Resource Strain (CARDIA)    Difficulty of Paying Living Expenses: Not very hard  Food Insecurity: No Food Insecurity (01/04/2022)   Hunger Vital Sign    Worried About Running Out of Food in the Last Year: Never true    Ran Out of Food in the Last Year: Never true  Transportation Needs: No Transportation Needs (01/04/2022)   PRAPARE - Hydrologist (Medical): No    Lack of Transportation (Non-Medical): No  Physical Activity: Sufficiently Active (01/04/2022)   Exercise Vital Sign    Days of Exercise per Week: 5 days    Minutes of Exercise per Session: 30 min   Stress: No Stress Concern Present (01/04/2022)   [place]    Feeling of Stress : Not at all  Social Connections: Moderately Integrated (01/04/2022)   Social Connection and Isolation Panel [NHANES]    Frequency of Communication with Friends and Family: More than three times a week    Frequency of Social Gatherings with Friends and Family: More than three times a week    Attends Religious Services: More than 4 times per year    Active Member of [employer] or Organizations: Yes    Attends Archivist Meetings: More than 4 times per year    Marital Status: Widowed  Intimate Partner Violence: Not At Risk (07/05/2021)   Humiliation, Afraid, Rape, and Kick questionnaire    Fear of Current or Ex-Partner: No    Emotionally Abused: No    Physically Abused: No    Sexually Abused: No    Outpatient Encounter Medications as of 01/29/2022  Medication Sig  Accu-Chek Softclix Lancets lancets check blood sugars twice daily DX E11.65   acetaminophen (TYLENOL) 500 MG tablet Take 500 mg by mouth every 6 (six) hours as needed for mild pain.   amoxicillin-clavulanate (AUGMENTIN) 875-125 MG tablet Take 1 tablet by mouth 2 (two) times daily for 10 days.   aspirin EC 81 MG tablet Take 1 tablet (81 mg total) by mouth daily.   carvedilol (COREG) 3.125 MG tablet TAKE (1) TABLET TWICE DAILY.   cetirizine (ZYRTEC) 10 MG tablet Take 1 tablet (10 mg total) by mouth daily.   cholecalciferol (VITAMIN D) 1000 UNITS tablet Take 1,000 Units by mouth daily.   empagliflozin (JARDIANCE) 10 MG TABS tablet Take 1 tablet (10 mg total) by mouth daily before breakfast. (Patient taking differently: Take 20 mg by mouth daily before breakfast.)   fluticasone (FLONASE) 50 MCG/ACT nasal spray Place 2 sprays into both nostrils as needed for allergies or rhinitis.   furosemide (LASIX) 40 MG tablet Take 1 tablet (40 mg total) by mouth daily.   gabapentin (NEURONTIN)  300 MG capsule Take 300 mg by mouth 3 (three) times daily as needed (Nerve in throat).   glucose blood (ACCU-CHEK GUIDE) test strip Use as instructed to test blood sugar daily as directed. DX:E11.9   HYDROcodone-acetaminophen (NORCO) 10-325 MG tablet Take 1 tablet by mouth every 6 (six) hours as needed for severe pain (Arthritis on neck).   lactulose (CHRONULAC) 10 GM/15ML solution TAKE 30 MLS (20 GRAM TOTAL) BY MOUTH 3 TIMES A DAY.   lamoTRIgine (LAMICTAL) 200 MG tablet TAKE (1) TABLET TWICE DAILY.   lidocaine (XYLOCAINE) 2 % solution Use as directed 10 mLs in the mouth or throat every 6 (six) hours as needed for mouth pain.   LORazepam (ATIVAN) 0.5 MG tablet Take 1 tablet (0.5 mg total) by mouth 2 (two) times daily as needed for anxiety.   magic mouthwash (nystatin, hydrocortisone, diphenhydrAMINE) suspension Swish and spit 10 mLs 3 (three) times daily.   magic mouthwash (nystatin, lidocaine, diphenhydrAMINE, alum & mag hydroxide) suspension Swish and swallow 5 mLs 3 (three) times daily.   Multiple Vitamins-Minerals (MULTIVITAMIN WITH MINERALS) tablet Take 1 tablet by mouth daily.   naloxone (NARCAN) nasal spray 4 mg/0.1 mL Place 1 spray into the nose once. Take by nasal route every 3 minutes until patient awakes or EMS arrives.   ondansetron (ZOFRAN) 4 MG tablet TAKE 1 TABLET BY MOUTH EVERY 8 HOURS AS NEEDED FOR NAUSEA AND VOMITING.   pantoprazole (PROTONIX) 40 MG tablet TAKE (1) TABLET TWICE DAILY.   potassium chloride SA (KLOR-CON M) 20 MEQ tablet Take 2 tablets (40 mEq total) by mouth daily.   sacubitril-valsartan (ENTRESTO) 24-26 MG Take 1 tablet by mouth 2 (two) times daily.   benzonatate (TESSALON) 200 MG capsule Take 1 capsule (200 mg total) by mouth 3 (three) times daily as needed. (Patient not taking: Reported on 01/29/2022)   No facility-administered encounter medications on file as of 01/29/2022.    No Known Allergies  Review of Systems  Constitutional:  Negative for activity  change, appetite change, chills, fatigue and fever.  HENT:  Positive for mouth sores.        Oral burning   Eyes: Negative.   Respiratory:  Negative for cough, chest tightness and shortness of breath.   Cardiovascular:  Negative for chest pain, palpitations and leg swelling.  Gastrointestinal:  Negative for blood in stool, constipation, diarrhea, nausea and vomiting.  Endocrine: Negative.   Genitourinary:  Negative for dysuria, frequency and  urgency.  Musculoskeletal:  Negative for arthralgias and myalgias.  Skin: Negative.   Allergic/Immunologic: Negative.   Neurological:  Negative for dizziness and headaches.  Hematological:  Positive for adenopathy.  Psychiatric/Behavioral:  Negative for confusion, hallucinations, sleep disturbance and suicidal ideas.   All other systems reviewed and are negative.       Objective:  BP 109/71   Pulse 95   Temp (!) 95 F (35 C) (Temporal)   Ht 5' 4"  (1.626 m)   Wt 166 lb 3.2 oz (75.4 kg)   SpO2 95%   BMI 28.53 kg/m    Wt Readings from Last 3 Encounters:  01/29/22 166 lb 3.2 oz (75.4 kg)  01/21/22 169 lb 3.2 oz (76.7 kg)  01/09/22 168 lb 6.4 oz (76.4 kg)    Physical Exam Vitals and nursing note reviewed.  Constitutional:      General: She is not in acute distress.    Appearance: Normal appearance. She is well-developed and well-groomed. She is obese. She is not ill-appearing, toxic-appearing or diaphoretic.  HENT:     Head: Normocephalic and atraumatic.     Jaw: There is normal jaw occlusion.     Right Ear: Hearing normal.     Left Ear: Hearing normal.     Nose: Nose normal.     Mouth/Throat:     Lips: Pink.     Mouth: Mucous membranes are moist.     Tongue: No lesions. Tongue does not deviate from midline.     Palate: No mass and lesions.     Pharynx: Oropharynx is clear. Uvula midline. Posterior oropharyngeal erythema present. No pharyngeal swelling, oropharyngeal exudate or uvula swelling.     Tonsils: No tonsillar exudate or  tonsillar abscesses.  Eyes:     General: Lids are normal.     Extraocular Movements: Extraocular movements intact.     Conjunctiva/sclera: Conjunctivae normal.     Pupils: Pupils are equal, round, and reactive to light.  Neck:     Thyroid: No thyroid mass, thyromegaly or thyroid tenderness.     Vascular: No carotid bruit or JVD.     Trachea: Trachea and phonation normal.  Cardiovascular:     Rate and Rhythm: Normal rate and regular rhythm.     Chest Wall: PMI is not displaced.     Pulses: Normal pulses.     Heart sounds: Normal heart sounds. No murmur heard.    No friction rub. No gallop.  Pulmonary:     Effort: Pulmonary effort is normal. No respiratory distress.     Breath sounds: Normal breath sounds. No wheezing.  Abdominal:     General: Bowel sounds are normal. There is no distension or abdominal bruit.     Palpations: Abdomen is soft. There is no hepatomegaly or splenomegaly.     Tenderness: There is no abdominal tenderness. There is no right CVA tenderness or left CVA tenderness.     Hernia: No hernia is present.  Musculoskeletal:        General: Normal range of motion.     Cervical back: Normal range of motion and neck supple.     Right lower leg: No edema.     Left lower leg: No edema.  Lymphadenopathy:     Head:     Right side of head: Submental and submandibular adenopathy present.     Left side of head: Submental and submandibular adenopathy present.     Cervical: No cervical adenopathy.  Skin:    General: Skin is  warm and dry.     Capillary Refill: Capillary refill takes less than 2 seconds.     Coloration: Skin is not cyanotic, jaundiced or pale.     Findings: No rash.  Neurological:     General: No focal deficit present.     Mental Status: She is alert and oriented to person, place, and time.     Sensory: Sensation is intact.     Motor: Motor function is intact.     Coordination: Coordination is intact.     Gait: Gait is intact.     Deep Tendon Reflexes:  Reflexes are normal and symmetric.  Psychiatric:        Attention and Perception: Attention and perception normal.        Mood and Affect: Mood and affect normal.        Speech: Speech normal.        Behavior: Behavior normal. Behavior is cooperative.        Thought Content: Thought content normal.        Cognition and Memory: Cognition and memory normal.        Judgment: Judgment normal.     Results for orders placed or performed in visit on 01/16/22  ECHOCARDIOGRAM COMPLETE  Result Value Ref Range   S' Lateral 3.60 cm   Area-P 1/2 2.76 cm2   Single Plane A2C EF 52.2 %   Single Plane A4C EF 49.9 %   Calc EF 50.8 %   MV M vel 4.43 m/s   MV Peak grad 78.5 mmHg   AR max vel 2.04 cm2   P 1/2 time 1,162 msec   Ao pk vel 1.43 m/s   AV Peak grad 8.2 mmHg   AV Area VTI 1.64 cm2   AV Area mean vel 1.67 cm2   AV Mean grad 6.0 mmHg       Pertinent labs & imaging results that were available during my care of the patient were reviewed by me and considered in my medical decision making.  Assessment & Plan:  Amali was seen today for follow-up.  Diagnoses and all orders for this visit:  Irritation of oral cavity No improvement with previous treatment with antifungals. B12 and folate normal. Due to lymphadenopathy, will trial below. If not beneficial, will refer to oral specialist.  -     amoxicillin-clavulanate (AUGMENTIN) 875-125 MG tablet; Take 1 tablet by mouth 2 (two) times daily for 10 days.  Lymphadenopathy Will treat with below to see if beneficial. Will obtain imaging if not improving.  -     amoxicillin-clavulanate (AUGMENTIN) 875-125 MG tablet; Take 1 tablet by mouth 2 (two) times daily for 10 days.     Continue all other maintenance medications.  Follow up plan: Return if symptoms worsen or fail to improve.    The above assessment and management plan was discussed with the patient. The patient verbalized understanding of and has agreed to the management plan.  Patient is aware to call the clinic if they develop any new symptoms or if symptoms persist or worsen. Patient is aware when to return to the clinic for a follow-up visit. Patient educated on when it is appropriate to go to the emergency department.   Monia Pouch, FNP-C [facility] [phone]

## 2022-01-30 ENCOUNTER — Ambulatory Visit: Payer: Medicare HMO | Admitting: Family Medicine

## 2022-01-30 ENCOUNTER — Telehealth: Payer: Self-pay | Admitting: Family Medicine

## 2022-01-30 DIAGNOSIS — E1165 Type 2 diabetes mellitus with hyperglycemia: Secondary | ICD-10-CM

## 2022-01-30 DIAGNOSIS — I5020 Unspecified systolic (congestive) heart failure: Secondary | ICD-10-CM

## 2022-01-30 NOTE — Telephone Encounter (Signed)
Left message to call back  

## 2022-01-30 NOTE — Telephone Encounter (Signed)
Patient aware that samples are up front.

## 2022-01-30 NOTE — Telephone Encounter (Signed)
Patient wants to know if we have any samples of empagliflozin (JARDIANCE) 10 MG TABS tablet

## 2022-01-30 NOTE — Telephone Encounter (Signed)
Patient wants to talk to nurse because she said she has been around a friend that has been sick and is supposed to have a procedure next week. Please call back

## 2022-01-31 ENCOUNTER — Telehealth: Payer: Medicare HMO

## 2022-01-31 ENCOUNTER — Encounter: Payer: Self-pay | Admitting: Family Medicine

## 2022-01-31 ENCOUNTER — Telehealth: Payer: Self-pay | Admitting: Family Medicine

## 2022-01-31 ENCOUNTER — Ambulatory Visit (INDEPENDENT_AMBULATORY_CARE_PROVIDER_SITE_OTHER): Payer: Medicare HMO | Admitting: Family Medicine

## 2022-01-31 DIAGNOSIS — J069 Acute upper respiratory infection, unspecified: Secondary | ICD-10-CM | POA: Diagnosis not present

## 2022-01-31 MED ORDER — PREDNISONE 20 MG PO TABS: 40.0000 mg | ORAL_TABLET | Freq: Every day | ORAL | 0 refills | Status: AC

## 2022-01-31 MED ORDER — AZITHROMYCIN 250 MG PO TABS: ORAL_TABLET | ORAL | 0 refills | Status: DC

## 2022-01-31 MED ORDER — GUAIFENESIN ER 600 MG PO TB12: 600.0000 mg | ORAL_TABLET | Freq: Two times a day (BID) | ORAL | 0 refills | Status: AC

## 2022-01-31 NOTE — Progress Notes (Signed)
Virtual Visit via telephone Note Due to COVID-19 pandemic this visit was conducted virtually. This visit type was conducted due to national recommendations for restrictions regarding the COVID-19 Pandemic (e.g. social distancing, sheltering in place) in an effort to limit this patient's exposure and mitigate transmission in our community. All issues noted in this document were discussed and addressed.  A physical exam was not performed with this format.   I connected with Carol Richmond on 01/31/2022 at 1200 by telephone and verified that I am speaking with the correct person using two identifiers. Carol Richmond is currently located at home and patient is currently with them during visit. The provider, Monia Pouch, FNP is located in their office at time of visit.  I discussed the limitations, risks, security and privacy concerns of performing an evaluation and management service by virtual visit and the availability of in person appointments. I also discussed with the patient that there may be a patient responsible charge related to this service. The patient expressed understanding and agreed to proceed.  Subjective:  [name], female    DOB: [birth year], 71 y.o.   MRN: [id number]  Chief Complaint:  URI   HPI: Carol Richmond is a 71 y.o. female presenting on 01/31/2022 for URI   Cough, congestion, chills, rhinorrhea, and postnasal drainage for over 5 days. Worsening over the last 2 days.   URI  This is a new problem. Episode onset: 5-6 days ago. The problem has been gradually worsening. Associated symptoms include congestion, coughing, ear pain, headaches, rhinorrhea and a sore throat. Pertinent negatives include no abdominal pain, chest pain, diarrhea, dysuria, joint pain, joint swelling, nausea, neck pain, plugged ear sensation, rash, sinus pain, sneezing, swollen glands, vomiting or wheezing. She has tried acetaminophen and increased fluids for the  symptoms. The treatment provided no relief.     Relevant past medical, surgical, family, and social history reviewed and updated as indicated.  Allergies and medications reviewed and updated.   Past Medical History:  Diagnosis Date   Asthma    AVNRT (AV nodal re-entry tachycardia)    Carotid artery disease (HCC)    Cirrhosis (HCC)    Diverticulosis of colon (without mention of hemorrhage)    Essential hypertension    Functional ovarian cysts    GERD (gastroesophageal reflux disease)    Hemorrhoids, external, without mention of complication    Hormone replacement therapy (postmenopausal)    IBS (irritable bowel syndrome)    Migraine    Mitral valve disease    Pacemaker    Complete heart block - [employer]   Right knee DJD    Seizures (HCC)    Sleep apnea    TIA (transient ischemic attack)    Type 2 diabetes mellitus ([place])     Past Surgical History:  Procedure Laterality Date   BIOPSY  09/23/2018   Procedure: BIOPSY;  Surgeon: Rogene Houston, MD;  [facility]: [facility];  Service: Endoscopy;;  duodeneum and stomach   CHOLECYSTECTOMY  1980   ENDARTERECTOMY Right 07/01/2017   Procedure: ENDARTERECTOMY CAROTID RIGHT;  Surgeon: Serafina Mitchell, MD;  Location: [facility] OR;  Service: Vascular;  Laterality: Right;   ESOPHAGOGASTRODUODENOSCOPY N/A 09/23/2018   Procedure: ESOPHAGOGASTRODUODENOSCOPY (EGD);  Surgeon: Rogene Houston, MD;  Location: [facility];  Service: Endoscopy;  Laterality: N/A;  2:45-moved to 1:45 per Lelon Frohlich   ESOPHAGOGASTRODUODENOSCOPY (EGD) WITH PROPOFOL N/A 03/07/2021   Procedure: ESOPHAGOGASTRODUODENOSCOPY (EGD) WITH PROPOFOL;  Surgeon: Rogene Houston, MD;  [facility]: [facility];  Service: Endoscopy;  Laterality: N/A;  12:50   LEFT HEART CATHETERIZATION WITH CORONARY ANGIOGRAM N/A 11/18/2013   Procedure: LEFT HEART CATHETERIZATION WITH CORONARY ANGIOGRAM;  Surgeon: Burnell Blanks, MD;  Location: [facility] CATH LAB;  Service: Cardiovascular;  Laterality: N/A;    OOPHORECTOMY     2012 Dr. Adah Perl in [place] N/A 12/14/2021   Procedure: PACEMAKER IMPLANT;  Surgeon: Evans Lance, MD;  Location: [place] CV LAB;  Service: Cardiovascular;  Laterality: N/A;   PATCH ANGIOPLASTY Right 07/01/2017   Procedure: RIGHT CAROTID PATCH ANGIOPLASTY;  Surgeon: Serafina Mitchell, MD;  Location: [facility] OR;  Service: Vascular;  Laterality: Right;   SVT ABLATION N/A 12/14/2021   Procedure: SVT ABLATION;  Surgeon: Evans Lance, MD;  Location: [place] [facility];  Service: Cardiovascular;  Laterality: N/A;   TONSILLECTOMY     TOTAL KNEE ARTHROPLASTY Right 09/02/2012   Procedure: TOTAL KNEE ARTHROPLASTY- right;  Surgeon: Ninetta Lights, MD;  Location: [place];  Service: Orthopedics;  Laterality: Right;   TUBAL LIGATION     TYMPANOMASTOIDECTOMY Left 04/02/2016   Procedure: LEFT CANAL WALL DOWN TYMPANOMASTOIDECTOMY;  Surgeon: Leta Baptist, MD;  Location: [place];  Service: ENT;  Laterality: Left;   [place] Right 07/02/2017   Procedure: RIGHT NECK EXPLORATION WITH INTRAOPERATIVE ULTRASOUND;  Surgeon: Angelia Mould, MD;  Location: [facility] OR;  Service: Vascular;  Laterality: Right;    Social History   Socioeconomic History   Marital status: Widowed    Spouse name: Not on file   Number of children: 3   Years of education: Not on file   Highest education level: Not on file  Occupational History   Occupation: retired  Tobacco Use   Smoking status: Never   Smokeless tobacco: Never  Vaping Use   Vaping Use: Never used  Substance and Sexual Activity   Alcohol use: No    Alcohol/week: 0.0 standard drinks of alcohol   Drug use: No   Sexual activity: Not Currently  Other Topics Concern   Not on file  Social History Narrative   Lives alone. Granddaughter lives across the street, her 3 children all live nearby   Social Determinants of Health   Financial Resource Strain: Low Risk  (01/04/2022)    Overall Financial Resource Strain (CARDIA)    Difficulty of Paying Living Expenses: Not very hard  Food Insecurity: No Food Insecurity (01/04/2022)   Hunger Vital Sign    Worried About Running Out of Food in the Last Year: Never true    Ran Out of Food in the Last Year: Never true  Transportation Needs: No Transportation Needs (01/04/2022)   PRAPARE - Hydrologist (Medical): No    Lack of Transportation (Non-Medical): No  Physical Activity: Sufficiently Active (01/04/2022)   Exercise Vital Sign    Days of Exercise per Week: 5 days    Minutes of Exercise per Session: 30 min  Stress: No Stress Concern Present (01/04/2022)   [place]    Feeling of Stress : Not at all  Social Connections: Moderately Integrated (01/04/2022)   Social Connection and Isolation Panel [NHANES]    Frequency of Communication with Friends and Family: More than three times a week    Frequency of Social Gatherings with Friends and Family: More than three times a week    Attends Religious  Services: More than 4 times per year    Active Member of Clubs or Organizations: Yes    Attends Archivist Meetings: More than 4 times per year    Marital Status: Widowed  Intimate Partner Violence: Not At Risk (07/05/2021)   Humiliation, Afraid, Rape, and Kick questionnaire    Fear of Current or Ex-Partner: No    Emotionally Abused: No    Physically Abused: No    Sexually Abused: No    Outpatient Encounter Medications as of 01/31/2022  Medication Sig   azithromycin (ZITHROMAX Z-PAK) 250 MG tablet As directed   guaiFENesin (MUCINEX) 600 MG 12 hr tablet Take 1 tablet (600 mg total) by mouth 2 (two) times daily for 10 days.   predniSONE (DELTASONE) 20 MG tablet Take 2 tablets (40 mg total) by mouth daily with breakfast for 5 days. 2 po daily for 5 days   Accu-Chek Softclix Lancets lancets check blood sugars twice daily DX  E11.65   acetaminophen (TYLENOL) 500 MG tablet Take 500 mg by mouth every 6 (six) hours as needed for mild pain.   amoxicillin-clavulanate (AUGMENTIN) 875-125 MG tablet Take 1 tablet by mouth 2 (two) times daily for 10 days.   aspirin EC 81 MG tablet Take 1 tablet (81 mg total) by mouth daily.   benzonatate (TESSALON) 200 MG capsule Take 1 capsule (200 mg total) by mouth 3 (three) times daily as needed. (Patient not taking: Reported on 01/29/2022)   carvedilol (COREG) 3.125 MG tablet TAKE (1) TABLET TWICE DAILY.   cetirizine (ZYRTEC) 10 MG tablet Take 1 tablet (10 mg total) by mouth daily.   cholecalciferol (VITAMIN D) 1000 UNITS tablet Take 1,000 Units by mouth daily.   empagliflozin (JARDIANCE) 10 MG TABS tablet Take 1 tablet (10 mg total) by mouth daily before breakfast. (Patient taking differently: Take 20 mg by mouth daily before breakfast.)   fluticasone (FLONASE) 50 MCG/ACT nasal spray Place 2 sprays into both nostrils as needed for allergies or rhinitis.   furosemide (LASIX) 40 MG tablet Take 1 tablet (40 mg total) by mouth daily.   gabapentin (NEURONTIN) 300 MG capsule Take 300 mg by mouth 3 (three) times daily as needed (Nerve in throat).   glucose blood (ACCU-CHEK GUIDE) test strip Use as instructed to test blood sugar daily as directed. DX:E11.9   HYDROcodone-acetaminophen (NORCO) 10-325 MG tablet Take 1 tablet by mouth every 6 (six) hours as needed for severe pain (Arthritis on neck).   lactulose (CHRONULAC) 10 GM/15ML solution TAKE 30 MLS (20 GRAM TOTAL) BY MOUTH 3 TIMES A DAY.   lamoTRIgine (LAMICTAL) 200 MG tablet TAKE (1) TABLET TWICE DAILY.   lidocaine (XYLOCAINE) 2 % solution Use as directed 10 mLs in the mouth or throat every 6 (six) hours as needed for mouth pain.   LORazepam (ATIVAN) 0.5 MG tablet Take 1 tablet (0.5 mg total) by mouth 2 (two) times daily as needed for anxiety.   magic mouthwash (nystatin, hydrocortisone, diphenhydrAMINE) suspension Swish and spit 10 mLs 3  (three) times daily.   magic mouthwash (nystatin, lidocaine, diphenhydrAMINE, alum & mag hydroxide) suspension Swish and swallow 5 mLs 3 (three) times daily.   Multiple Vitamins-Minerals (MULTIVITAMIN WITH MINERALS) tablet Take 1 tablet by mouth daily.   naloxone (NARCAN) nasal spray 4 mg/0.1 mL Place 1 spray into the nose once. Take by nasal route every 3 minutes until patient awakes or EMS arrives.   ondansetron (ZOFRAN) 4 MG tablet TAKE 1 TABLET BY MOUTH EVERY 8 HOURS AS  NEEDED FOR NAUSEA AND VOMITING.   pantoprazole (PROTONIX) 40 MG tablet TAKE (1) TABLET TWICE DAILY.   potassium chloride SA (KLOR-CON M) 20 MEQ tablet Take 2 tablets (40 mEq total) by mouth daily.   sacubitril-valsartan (ENTRESTO) 24-26 MG Take 1 tablet by mouth 2 (two) times daily.   No facility-administered encounter medications on file as of 01/31/2022.    No Known Allergies  Review of Systems  Constitutional:  Positive for chills and fatigue. Negative for activity change, appetite change, diaphoresis, fever and unexpected weight change.  HENT:  Positive for congestion, ear pain, postnasal drip, rhinorrhea, sore throat and voice change. Negative for dental problem, drooling, ear discharge, facial swelling, hearing loss, mouth sores, nosebleeds, sinus pressure, sinus pain, sneezing, tinnitus and trouble swallowing.   Eyes:  Negative for photophobia and visual disturbance.  Respiratory:  Positive for cough. Negative for apnea, choking, chest tightness, shortness of breath, wheezing and stridor.   Cardiovascular:  Negative for chest pain, palpitations and leg swelling.  Gastrointestinal:  Negative for abdominal pain, diarrhea, nausea and vomiting.  Endocrine: Negative for polydipsia, polyphagia and polyuria.  Genitourinary:  Negative for decreased urine volume, difficulty urinating and dysuria.  Musculoskeletal:  Negative for arthralgias, joint pain, myalgias and neck pain.  Skin:  Negative for rash.  Neurological:   Positive for headaches. Negative for dizziness, tremors, seizures, syncope, facial asymmetry, speech difficulty, weakness, light-headedness and numbness.  Psychiatric/Behavioral:  Negative for confusion.   All other systems reviewed and are negative.        Observations/Objective: No vital signs or physical exam, this was a virtual health encounter.  Pt alert and oriented, answers all questions appropriately, and able to speak in full sentences.  Sounds congested nasally.   Assessment and Plan: Ronella was seen today for uri.  Diagnoses and all orders for this visit:  URI with cough and congestion Ongoing and worsening symptoms despite symptomatic care at home. Will start below. Pt aware to continue symptomatic care at home and to report new, worsening, or persistent symptoms.  -     azithromycin (ZITHROMAX Z-PAK) 250 MG tablet; As directed -     predniSONE (DELTASONE) 20 MG tablet; Take 2 tablets (40 mg total) by mouth daily with breakfast for 5 days. 2 po daily for 5 days -     guaiFENesin (MUCINEX) 600 MG 12 hr tablet; Take 1 tablet (600 mg total) by mouth 2 (two) times daily for 10 days.     Follow Up Instructions: Return if symptoms worsen or fail to improve.    I discussed the assessment and treatment plan with the patient. The patient was provided an opportunity to ask questions and all were answered. The patient agreed with the plan and demonstrated an understanding of the instructions.   The patient was advised to call back or seek an in-person evaluation if the symptoms worsen or if the condition fails to improve as anticipated.  The above assessment and management plan was discussed with the patient. The patient verbalized understanding of and has agreed to the management plan. Patient is aware to call the clinic if they develop any new symptoms or if symptoms persist or worsen. Patient is aware when to return to the clinic for a follow-up visit. Patient educated on when  it is appropriate to go to the emergency department.    I provided 15 minutes of time during this telephone encounter.   Monia Pouch, FNP-C [facility] [address] [place], [place] [address] [phone]  [phone] 01/31/2022   

## 2022-01-31 NOTE — Telephone Encounter (Signed)
Pt aware she is ok to medication

## 2022-02-01 ENCOUNTER — Encounter: Payer: Self-pay | Admitting: Nurse Practitioner

## 2022-02-01 ENCOUNTER — Telehealth (INDEPENDENT_AMBULATORY_CARE_PROVIDER_SITE_OTHER): Payer: Medicare HMO | Admitting: Nurse Practitioner

## 2022-02-01 ENCOUNTER — Telehealth: Payer: Self-pay | Admitting: Cardiology

## 2022-02-01 ENCOUNTER — Ambulatory Visit: Payer: Medicare HMO

## 2022-02-01 DIAGNOSIS — J069 Acute upper respiratory infection, unspecified: Secondary | ICD-10-CM | POA: Diagnosis not present

## 2022-02-01 NOTE — Patient Instructions (Signed)

## 2022-02-01 NOTE — Telephone Encounter (Signed)
Says she has URI and wants to reschedule TEE that is scheduled for next week on 02/06/2022. Advised to call office back with date she wants to have it done and I would reschedule it at that time.

## 2022-02-01 NOTE — Progress Notes (Signed)
Virtual Visit  Note Due to COVID-19 pandemic this visit was conducted virtually. This visit type was conducted due to national recommendations for restrictions regarding the COVID-19 Pandemic (e.g. social distancing, sheltering in place) in an effort to limit this patient's exposure and mitigate transmission in our community. All issues noted in this document were discussed and addressed.  A physical exam was not performed with this format. Patient could not do video with her flip phone.  I connected with Carol Richmond on 02/01/22 at 10:55 by telephone and verified that I am speaking with the correct person using two identifiers. Carol Richmond is currently located at home and no one is currently with her during visit. The provider, Mary-Margaret Hassell Done, FNP is located in their office at time of visit.  I discussed the limitations, risks, security and privacy concerns of performing an evaluation and management service by telephone and the availability of in person appointments. I also discussed with the patient that there may be a patient responsible charge related to this service. The patient expressed understanding and agreed to proceed.   History and Present Illness:  Patient did telephone visit yesterday with M. Rakes. Was dx with uri with cough and congestion. She was given a zpak. And steroids. She wanted to let me know that she has liver problems and wanted to know I was safe to take prednisone. She is scheduled to have a procedure next week and wanted to know if she could still have done.       Review of Systems  Constitutional:  Negative for chills and fever.  HENT:  Positive for congestion.   Respiratory:  Positive for cough and sputum production. Negative for shortness of breath.      Observations/Objective: Alert and oriented- answers all questions appropriately No distress Raspy voice Deep cough  Assessment and Plan: Carol Richmond in today with chief  complaint of uri  1. URI with cough and congestion 1. Take meds as prescribed 2. Use a cool mist humidifier especially during the winter months and when heat has been humid. 3. Use saline nose sprays frequently 4. Saline irrigations of the nose can be very helpful if done frequently.  * 4X daily for 1 week*  * Use of a nettie pot can be helpful with this. Follow directions with this* 5. Drink plenty of fluids 6. Keep thermostat turn down low 7.For any cough or congestion- mucinex OTC 8. For fever or aces or pains- take tylenol or ibuprofen appropriate for age and weight.  * for fevers greater than 101 orally you may alternate ibuprofen and tylenol every  3 hours.   Antibiotics and steroids as prescribed   Follow Up Instructions: prn    I discussed the assessment and treatment plan with the patient. The patient was provided an opportunity to ask questions and all were answered. The patient agreed with the plan and demonstrated an understanding of the instructions.   The patient was advised to call back or seek an in-person evaluation if the symptoms worsen or if the condition fails to improve as anticipated.  The above assessment and management plan was discussed with the patient. The patient verbalized understanding of and has agreed to the management plan. Patient is aware to call the clinic if symptoms persist or worsen. Patient is aware when to return to the clinic for a follow-up visit. Patient educated on when it is appropriate to go to the emergency department.   Time call ended:  11:08  I provided 12 minutes of  non face-to-face time during this encounter.    Mary-Margaret Hassell Done, FNP

## 2022-02-01 NOTE — Telephone Encounter (Signed)
Patient would like for the nurse to give her a call back. Please advise

## 2022-02-04 ENCOUNTER — Telehealth: Payer: Self-pay | Admitting: Family Medicine

## 2022-02-04 ENCOUNTER — Other Ambulatory Visit: Payer: Self-pay | Admitting: *Deleted

## 2022-02-04 ENCOUNTER — Telehealth: Payer: Self-pay | Admitting: Cardiology

## 2022-02-04 MED ORDER — ENTRESTO 24-26 MG PO TABS: 1.0000 | ORAL_TABLET | Freq: Two times a day (BID) | ORAL | 3 refills | Status: DC

## 2022-02-04 NOTE — Telephone Encounter (Signed)
Please give pt a call after 4:00 so she is able to speak with her daughter.

## 2022-02-04 NOTE — Telephone Encounter (Signed)
Patient aware that TEE has been rescheduled to 03/01/22 @10 :00 am.

## 2022-02-04 NOTE — Telephone Encounter (Signed)
I spoke to pt and she just wanted to let us know she rescheduled her procedure because she didn't have a ride to get there on Wednesday and also that she finished her zpak. She didn't have any further questions or concerns.

## 2022-02-04 NOTE — Telephone Encounter (Signed)
Spoke with daughter and advised that TEE will be rescheduled due to URI and to allow healing time. TEE rescheduled to 03/01/2022 with Dr. Sallyanne Kuster at 10:00 am. Daughter aware of new appointment date and time

## 2022-02-04 NOTE — Telephone Encounter (Signed)
Pt's daughter states that pt is scheduled to have procedure on 12/20. She states that pt has recently been sick and is currently on antibiotics including Prednisone. Daughter would like to know if pt is still able to have procedure or does she need to reschedule. Please advise

## 2022-02-05 DIAGNOSIS — M5030 Other cervical disc degeneration, unspecified cervical region: Secondary | ICD-10-CM | POA: Diagnosis not present

## 2022-02-05 DIAGNOSIS — M5412 Radiculopathy, cervical region: Secondary | ICD-10-CM | POA: Diagnosis not present

## 2022-02-05 DIAGNOSIS — M792 Neuralgia and neuritis, unspecified: Secondary | ICD-10-CM | POA: Diagnosis not present

## 2022-02-05 DIAGNOSIS — M47812 Spondylosis without myelopathy or radiculopathy, cervical region: Secondary | ICD-10-CM | POA: Diagnosis not present

## 2022-02-06 ENCOUNTER — Encounter (INDEPENDENT_AMBULATORY_CARE_PROVIDER_SITE_OTHER): Payer: Self-pay | Admitting: *Deleted

## 2022-02-06 NOTE — Telephone Encounter (Signed)
I have already spoken to pt regarding this and she has had 2 visits regarding this and her procedure was rescheduled due to her daughter not being able to take her to the original scheduled appt.

## 2022-02-07 ENCOUNTER — Telehealth: Payer: Self-pay | Admitting: Family Medicine

## 2022-02-07 ENCOUNTER — Telehealth (INDEPENDENT_AMBULATORY_CARE_PROVIDER_SITE_OTHER): Payer: Self-pay

## 2022-02-07 NOTE — Telephone Encounter (Signed)
She can go down to lactulose 1 to 2 times a day. If having more than 3 bowel movements, needs to keep cutting down on the medication or even stop it. Please ask her to give [place] an update in a 5 days and see if we need to do any stool studies (if still diarrhea without even taking lactulose) Thanks

## 2022-02-07 NOTE — Telephone Encounter (Signed)
Pt called requesting to speak with Carol Richmond or nurse regarding her sugar levels.   Day before yesterday her sugar was 102. Pt did not check yesterday. Today it is 106 after taking medicine but before eating.

## 2022-02-07 NOTE — Telephone Encounter (Signed)
Patient called today states she has been having diarrhea for the last week or so. She says she had been taking Lactulose 30 ml QID and was cut back to TID back in June 2023. She says for the last few days she has had multiple loose stools that will wake her from her sleep. She had two this morning, then she ate and had three more loose bm's. She denies any abdominal pain, mucus in stools, fevers, dark or bloody stools. She reports that she took imodium and it has subsided for now. Wants to know if this is normal. ( I informed the patient that Lactulose is used as a laxative.) Please advise.

## 2022-02-07 NOTE — Telephone Encounter (Signed)
Patient made aware she can go down to lactulose 1 to 2 times a day. If having more than 3 bowel movements, needs to keep cutting down on the medication or even stop it. Please ask her to give [place] an update in a 5 days and see if we need to do any stool studies (if still diarrhea without even taking lactulose) Thanks

## 2022-02-07 NOTE — Telephone Encounter (Signed)
I spoke to pt and she was concerned that FBS of 106 was too low. I advised pt it is ok to have a FBS of 106 and to continue to monitor her readings and notify us if she becomes symptomatic with lightheaded, shakiness or feeling like she might pass out and pt voiced understanding.

## 2022-02-14 ENCOUNTER — Encounter: Payer: Self-pay | Admitting: Family Medicine

## 2022-02-14 ENCOUNTER — Telehealth (INDEPENDENT_AMBULATORY_CARE_PROVIDER_SITE_OTHER): Payer: Medicare HMO | Admitting: Family Medicine

## 2022-02-14 DIAGNOSIS — J069 Acute upper respiratory infection, unspecified: Secondary | ICD-10-CM | POA: Diagnosis not present

## 2022-02-14 NOTE — Progress Notes (Signed)
Virtual Visit via telephone Note Due to COVID-19 pandemic this visit was conducted virtually. This visit type was conducted due to national recommendations for restrictions regarding the COVID-19 Pandemic (e.g. social distancing, sheltering in place) in an effort to limit this patient's exposure and mitigate transmission in our community. All issues noted in this document were discussed and addressed.  A physical exam was not performed with this format.   I connected with Carol Richmond on 02/14/2022 at 1245 by telephone and verified that I am speaking with the correct person using two identifiers. Carol Richmond is currently located at home and patient is currently with them during visit. The provider, Monia Pouch, FNP is located in their office at time of visit.  I discussed the limitations, risks, security and privacy concerns of performing an evaluation and management service by virtual visit and the availability of in person appointments. I also discussed with the patient that there may be a patient responsible charge related to this service. The patient expressed understanding and agreed to proceed.  Subjective:  [name], female    DOB: January 08, 1951, 71 y.o.   MRN: [id number]  Chief Complaint:  URI   HPI: Carol Richmond is a 71 y.o. female presenting on 02/14/2022 for URI   Pt reports cough, congestion, and rhinorrhea for last 3 days. Has been taking Robitussin and Mucinex with great relief of symptoms. States she is much better today. COVID negative.   URI  This is a new problem. Episode onset: 3 days ago. The problem has been gradually improving. There has been no fever. Associated symptoms include congestion, coughing and rhinorrhea. Pertinent negatives include no abdominal pain, chest pain, diarrhea, dysuria, ear pain, headaches, joint pain, joint swelling, nausea, neck pain, plugged ear sensation, rash, sinus pain, sneezing, sore throat,  swollen glands, vomiting or wheezing. She has tried decongestant, increased fluids and acetaminophen for the symptoms. The treatment provided moderate relief.     Relevant past medical, surgical, family, and social history reviewed and updated as indicated.  Allergies and medications reviewed and updated.   Past Medical History:  Diagnosis Date   Asthma    AVNRT (AV nodal re-entry tachycardia)    Carotid artery disease (HCC)    Cirrhosis (HCC)    Diverticulosis of colon (without mention of hemorrhage)    Essential hypertension    Functional ovarian cysts    GERD (gastroesophageal reflux disease)    Hemorrhoids, external, without mention of complication    Hormone replacement therapy (postmenopausal)    IBS (irritable bowel syndrome)    Migraine    Mitral valve disease    Pacemaker    Complete heart block - [employer]   Right knee DJD    Seizures (HCC)    Sleep apnea    TIA (transient ischemic attack)    Type 2 diabetes mellitus ([place])     Past Surgical History:  Procedure Laterality Date   BIOPSY  09/23/2018   Procedure: BIOPSY;  Surgeon: Rogene Houston, MD;  [facility]: [facility];  Service: Endoscopy;;  duodeneum and stomach   CHOLECYSTECTOMY  1980   ENDARTERECTOMY Right 07/01/2017   Procedure: ENDARTERECTOMY CAROTID RIGHT;  Surgeon: Serafina Mitchell, MD;  Location: [facility];  Service: Vascular;  Laterality: Right;   ESOPHAGOGASTRODUODENOSCOPY N/A 09/23/2018   Procedure: ESOPHAGOGASTRODUODENOSCOPY (EGD);  Surgeon: Rogene Houston, MD;  [facility]: [facility];  Service: Endoscopy;  Laterality: N/A;  2:45-moved to 1:45 per Lelon Frohlich   ESOPHAGOGASTRODUODENOSCOPY (EGD)  WITH PROPOFOL N/A 03/07/2021   Procedure: ESOPHAGOGASTRODUODENOSCOPY (EGD) WITH PROPOFOL;  Surgeon: Rogene Houston, MD;  [facility];  Service: Endoscopy;  Laterality: N/A;  12:50   LEFT HEART CATHETERIZATION WITH CORONARY ANGIOGRAM N/A 11/18/2013   Procedure: LEFT HEART CATHETERIZATION WITH CORONARY  ANGIOGRAM;  Surgeon: Burnell Blanks, MD;  Location: [facility] CATH LAB;  Service: Cardiovascular;  Laterality: N/A;   OOPHORECTOMY     2012 Dr. Adah Perl in [place] N/A 12/14/2021   Procedure: PACEMAKER IMPLANT;  Surgeon: Evans Lance, MD;  Location: [place] CV LAB;  Service: Cardiovascular;  Laterality: N/A;   PATCH ANGIOPLASTY Right 07/01/2017   Procedure: RIGHT CAROTID PATCH ANGIOPLASTY;  Surgeon: Serafina Mitchell, MD;  Location: [facility];  Service: Vascular;  Laterality: Right;   SVT ABLATION N/A 12/14/2021   Procedure: SVT ABLATION;  Surgeon: Evans Lance, MD;  Location: [place] [facility] LAB;  Service: Cardiovascular;  Laterality: N/A;   TONSILLECTOMY     TOTAL KNEE ARTHROPLASTY Right 09/02/2012   Procedure: TOTAL KNEE ARTHROPLASTY- right;  Surgeon: Ninetta Lights, MD;  Location: [place];  Service: Orthopedics;  Laterality: Right;   TUBAL LIGATION     TYMPANOMASTOIDECTOMY Left 04/02/2016   Procedure: LEFT CANAL WALL DOWN TYMPANOMASTOIDECTOMY;  Surgeon: Leta Baptist, MD;  Location: [place];  Service: ENT;  Laterality: Left;   [place] Right 07/02/2017   Procedure: RIGHT NECK EXPLORATION WITH INTRAOPERATIVE ULTRASOUND;  Surgeon: Angelia Mould, MD;  Location[facility] OR;  Service: Vascular;  Laterality: Right;    Social History   Socioeconomic History   Marital status: Widowed    Spouse name: Not on file   Number of children: 3   Years of education: Not on file   Highest education level: Not on file  Occupational History   Occupation: retired  Tobacco Use   Smoking status: Never   Smokeless tobacco: Never  Vaping Use   Vaping Use: Never used  Substance and Sexual Activity   Alcohol use: No    Alcohol/week: 0.0 standard drinks of alcohol   Drug use: No   Sexual activity: Not Currently  Other Topics Concern   Not on file  Social History Narrative   Lives alone. Granddaughter lives across the street, her  3 children all live nearby   Social Determinants of Health   Financial Resource Strain: Low Risk  (01/04/2022)   Overall Financial Resource Strain (CARDIA)    Difficulty of Paying Living Expenses: Not very hard  Food Insecurity: No Food Insecurity (01/04/2022)   Hunger Vital Sign    Worried About Running Out of Food in the Last Year: Never true    Ran Out of Food in the Last Year: Never true  Transportation Needs: No Transportation Needs (01/04/2022)   PRAPARE - Hydrologist (Medical): No    Lack of Transportation (Non-Medical): No  Physical Activity: Sufficiently Active (01/04/2022)   Exercise Vital Sign    Days of Exercise per Week: 5 days    Minutes of Exercise per Session: 30 min  Stress: No Stress Concern Present (01/04/2022)   [place]    Feeling of Stress : Not at all  Social Connections: Moderately Integrated (01/04/2022)   Social Connection and Isolation Panel [NHANES]    Frequency of Communication with Friends and Family: More than three times a week    Frequency  of Social Gatherings with Friends and Family: More than three times a week    Attends Religious Services: More than 4 times per year    Active Member of [employer] or Organizations: Yes    Attends Archivist Meetings: More than 4 times per year    Marital Status: Widowed  Intimate Partner Violence: Not At Risk (07/05/2021)   Humiliation, Afraid, Rape, and Kick questionnaire    Fear of Current or Ex-Partner: No    Emotionally Abused: No    Physically Abused: No    Sexually Abused: No    Outpatient Encounter Medications as of 02/14/2022  Medication Sig   Accu-Chek Softclix Lancets lancets check blood sugars twice daily DX E11.65   acetaminophen (TYLENOL) 500 MG tablet Take 500 mg by mouth every 6 (six) hours as needed for mild pain.   aspirin EC 81 MG tablet Take 1 tablet (81 mg total) by mouth daily.    azithromycin (ZITHROMAX Z-PAK) 250 MG tablet As directed   benzonatate (TESSALON) 200 MG capsule Take 1 capsule (200 mg total) by mouth 3 (three) times daily as needed. (Patient not taking: Reported on 01/29/2022)   carvedilol (COREG) 3.125 MG tablet TAKE (1) TABLET TWICE DAILY.   cetirizine (ZYRTEC) 10 MG tablet Take 1 tablet (10 mg total) by mouth daily.   cholecalciferol (VITAMIN D) 1000 UNITS tablet Take 1,000 Units by mouth daily.   empagliflozin (JARDIANCE) 10 MG TABS tablet Take 1 tablet (10 mg total) by mouth daily before breakfast. (Patient taking differently: Take 20 mg by mouth daily before breakfast.)   fluticasone (FLONASE) 50 MCG/ACT nasal spray Place 2 sprays into both nostrils as needed for allergies or rhinitis.   furosemide (LASIX) 40 MG tablet Take 1 tablet (40 mg total) by mouth daily.   gabapentin (NEURONTIN) 300 MG capsule Take 300 mg by mouth 3 (three) times daily as needed (Nerve in throat).   glucose blood (ACCU-CHEK GUIDE) test strip Use as instructed to test blood sugar daily as directed. DX:E11.9   HYDROcodone-acetaminophen (NORCO) 10-325 MG tablet Take 1 tablet by mouth every 6 (six) hours as needed for severe pain (Arthritis on neck).   lactulose (CHRONULAC) 10 GM/15ML solution TAKE 30 MLS (20 GRAM TOTAL) BY MOUTH 3 TIMES A DAY.   lamoTRIgine (LAMICTAL) 200 MG tablet TAKE (1) TABLET TWICE DAILY.   lidocaine (XYLOCAINE) 2 % solution Use as directed 10 mLs in the mouth or throat every 6 (six) hours as needed for mouth pain.   LORazepam (ATIVAN) 0.5 MG tablet Take 1 tablet (0.5 mg total) by mouth 2 (two) times daily as needed for anxiety.   magic mouthwash (nystatin, hydrocortisone, diphenhydrAMINE) suspension Swish and spit 10 mLs 3 (three) times daily.   magic mouthwash (nystatin, lidocaine, diphenhydrAMINE, alum & mag hydroxide) suspension Swish and swallow 5 mLs 3 (three) times daily.   Multiple Vitamins-Minerals (MULTIVITAMIN WITH MINERALS) tablet Take 1 tablet by  mouth daily.   naloxone (NARCAN) nasal spray 4 mg/0.1 mL Place 1 spray into the nose once. Take by nasal route every 3 minutes until patient awakes or EMS arrives.   ondansetron (ZOFRAN) 4 MG tablet TAKE 1 TABLET BY MOUTH EVERY 8 HOURS AS NEEDED FOR NAUSEA AND VOMITING.   pantoprazole (PROTONIX) 40 MG tablet TAKE (1) TABLET TWICE DAILY.   potassium chloride SA (KLOR-CON M) 20 MEQ tablet Take 2 tablets (40 mEq total) by mouth daily.   sacubitril-valsartan (ENTRESTO) 24-26 MG Take 1 tablet by mouth 2 (two) times daily.  No facility-administered encounter medications on file as of 02/14/2022.    No Known Allergies  Review of Systems  Constitutional:  Negative for activity change, appetite change, chills, diaphoresis, fatigue, fever and unexpected weight change.  HENT:  Positive for congestion, postnasal drip, rhinorrhea and voice change. Negative for dental problem, drooling, ear discharge, ear pain, facial swelling, hearing loss, mouth sores, nosebleeds, sinus pressure, sinus pain, sneezing, sore throat, tinnitus and trouble swallowing.   Respiratory:  Positive for cough. Negative for apnea, choking, chest tightness, shortness of breath, wheezing and stridor.   Cardiovascular:  Negative for chest pain.  Gastrointestinal:  Negative for abdominal pain, diarrhea, nausea and vomiting.  Genitourinary:  Negative for decreased urine volume, difficulty urinating and dysuria.  Musculoskeletal:  Negative for arthralgias, joint pain, myalgias and neck pain.  Skin:  Negative for rash.  Neurological:  Negative for weakness and headaches.  Psychiatric/Behavioral:  Negative for confusion.   All other systems reviewed and are negative.        Observations/Objective: No vital signs or physical exam, this was a virtual health encounter.  Pt alert and oriented, answers all questions appropriately, and able to speak in full sentences.    Assessment and Plan: Talecia was seen today for uri.  Diagnoses  and all orders for this visit:  URI with cough and congestion Has been doing well with symptomatic care at home. No indications of acute bacterial illness as symptoms are significantly improved. Continue symptomatic care at home. Report new, worsening, or persistent symptoms.     Follow Up Instructions: Return if symptoms worsen or fail to improve.    I discussed the assessment and treatment plan with the patient. The patient was provided an opportunity to ask questions and all were answered. The patient agreed with the plan and demonstrated an understanding of the instructions.   The patient was advised to call back or seek an in-person evaluation if the symptoms worsen or if the condition fails to improve as anticipated.  The above assessment and management plan was discussed with the patient. The patient verbalized understanding of and has agreed to the management plan. Patient is aware to call the clinic if they develop any new symptoms or if symptoms persist or worsen. Patient is aware when to return to the clinic for a follow-up visit. Patient educated on when it is appropriate to go to the emergency department.    I provided 12 minutes of time during this telephone encounter.   Monia Pouch, FNP-C [facility] [address] [place], [place] [id number] [phone] 02/14/2022

## 2022-02-16 ENCOUNTER — Other Ambulatory Visit (INDEPENDENT_AMBULATORY_CARE_PROVIDER_SITE_OTHER): Payer: Self-pay | Admitting: Internal Medicine

## 2022-02-19 NOTE — Telephone Encounter (Signed)
Patient left voice mail message stating she needs someone to give her a call - ph# [phone]

## 2022-02-20 ENCOUNTER — Ambulatory Visit: Payer: Medicare HMO | Admitting: Family Medicine

## 2022-02-20 ENCOUNTER — Ambulatory Visit (INDEPENDENT_AMBULATORY_CARE_PROVIDER_SITE_OTHER): Payer: Medicare HMO | Admitting: Family Medicine

## 2022-02-20 ENCOUNTER — Encounter: Payer: Self-pay | Admitting: Family Medicine

## 2022-02-20 VITALS — BP 105/63 | HR 84 | Temp 97.8°F | Ht 64.0 in | Wt 163.2 lb

## 2022-02-20 DIAGNOSIS — R070 Pain in throat: Secondary | ICD-10-CM | POA: Diagnosis not present

## 2022-02-20 DIAGNOSIS — F5104 Psychophysiologic insomnia: Secondary | ICD-10-CM

## 2022-02-20 DIAGNOSIS — L0100 Impetigo, unspecified: Secondary | ICD-10-CM

## 2022-02-20 DIAGNOSIS — G8929 Other chronic pain: Secondary | ICD-10-CM | POA: Diagnosis not present

## 2022-02-20 MED ORDER — MUPIROCIN 2 % EX OINT: 1.0000 | TOPICAL_OINTMENT | Freq: Two times a day (BID) | CUTANEOUS | 0 refills | Status: AC

## 2022-02-20 MED ORDER — TRAZODONE HCL 50 MG PO TABS: 25.0000 mg | ORAL_TABLET | Freq: Every evening | ORAL | 3 refills | Status: DC | PRN

## 2022-02-20 NOTE — Progress Notes (Signed)
   Acute Office Visit  Subjective:     [name], female    DOB: 11/09/1950, 72 y.o.   MRN: [id number]  Chief Complaint  Patient presents with   Sore Throat    HPI Patient is in today for sore throat. This is a chronic problem. She denies new or worsening symptoms. She will see ENT in in a few weeks for follow up. She denies trouble swallowing, fever, cough, congestion, or neck pain. She has tried and failed gabapentin, magic mouthwash, nystatin, and tylenol in the past.   She also has a lesion between her nose and mouth that is a little sore. She has been using neosporin with some improvement.   She still struggles with falling asleep and staying sleep. She has been taking melatonin for a few months now without improvement.   ROS As per HPI.      Objective:    BP 105/63   Pulse 84   Temp 97.8 F (36.6 C) (Temporal)   Ht '5\' 4"'$  (1.626 m)   Wt 163 lb 4 oz (74 kg)   SpO2 96%   BMI 28.02 kg/m    Physical Exam Vitals and nursing note reviewed.  Constitutional:      General: She is not in acute distress.    Appearance: She is not ill-appearing, toxic-appearing or diaphoretic.  HENT:     Head: Normocephalic and atraumatic.     Nose: No congestion or rhinorrhea.     Comments: Small lesion x 2 with honey crust below nose.     Mouth/Throat:     Mouth: Mucous membranes are moist. No oral lesions.     Pharynx: Oropharynx is clear. No pharyngeal swelling, oropharyngeal exudate, posterior oropharyngeal erythema or uvula swelling.     Tonsils: No tonsillar exudate. 0 on the right. 0 on the left.  Pulmonary:     Effort: Pulmonary effort is normal. No respiratory distress.  Skin:    General: Skin is warm and dry.  Neurological:     General: No focal deficit present.     Mental Status: She is alert and oriented to person, place, and time.  Psychiatric:        Mood and Affect: Mood normal.        Behavior: Behavior normal.     No results found for any  visits on 02/20/22.      Assessment & Plan:   Calandra was seen today for sore throat.  Diagnoses and all orders for this visit:  Chronic throat pain Managed by ENT. Discussed need for follow up with them and to request records to be sent for my review.   Impetigo Bactroban as below. Discuss prevention of spread.  -     mupirocin ointment (BACTROBAN) 2 %; Apply 1 Application topically 2 (two) times daily for 7 days.  Chronic insomnia Uncontrolled. Failed melatonin. Start trazodone.  -     traZODone (DESYREL) 50 MG tablet; Take 0.5-1 tablets (25-50 mg total) by mouth at bedtime as needed for sleep.   Return if symptoms worsen or fail to improve.  The patient indicates understanding of these issues and agrees with the plan.  Gwenlyn Perking, FNP

## 2022-02-22 ENCOUNTER — Other Ambulatory Visit (INDEPENDENT_AMBULATORY_CARE_PROVIDER_SITE_OTHER): Payer: Self-pay | Admitting: Gastroenterology

## 2022-02-22 MED ORDER — LACTULOSE 10 GM/15ML PO SOLN: ORAL | 0 refills | Status: DC

## 2022-02-22 NOTE — Telephone Encounter (Signed)
Patient called because when she picked up lactulose it did not have a refill. She just picked up refill and has none left. For beginning of feb. Her appt is 04/04/22 and she will be out again before then. She asking [facility] one [facility] so she does not run out. ( I saw note in her chart from 12/21 where she was asked to cut down to 1 to 2 times a day and she told me she is now back to taking 30 mls tid since having a virus that caused diarrhea is now better.)  Patient last seen 10/01/21.   [phone]

## 2022-02-22 NOTE — Telephone Encounter (Signed)
Patient aware.

## 2022-02-25 ENCOUNTER — Telehealth: Payer: Self-pay | Admitting: Family Medicine

## 2022-02-25 ENCOUNTER — Telehealth: Payer: Self-pay | Admitting: Nurse Practitioner

## 2022-02-25 ENCOUNTER — Encounter: Payer: Self-pay | Admitting: Family Medicine

## 2022-02-25 ENCOUNTER — Ambulatory Visit (INDEPENDENT_AMBULATORY_CARE_PROVIDER_SITE_OTHER): Payer: Medicare HMO | Admitting: Family Medicine

## 2022-02-25 VITALS — BP 102/61 | HR 73 | Temp 98.1°F | Ht 64.0 in | Wt 162.0 lb

## 2022-02-25 DIAGNOSIS — F5104 Psychophysiologic insomnia: Secondary | ICD-10-CM

## 2022-02-25 DIAGNOSIS — I5022 Chronic systolic (congestive) heart failure: Secondary | ICD-10-CM | POA: Diagnosis not present

## 2022-02-25 DIAGNOSIS — K7460 Unspecified cirrhosis of liver: Secondary | ICD-10-CM | POA: Diagnosis not present

## 2022-02-25 DIAGNOSIS — Z950 Presence of cardiac pacemaker: Secondary | ICD-10-CM

## 2022-02-25 DIAGNOSIS — E785 Hyperlipidemia, unspecified: Secondary | ICD-10-CM

## 2022-02-25 DIAGNOSIS — Z79899 Other long term (current) drug therapy: Secondary | ICD-10-CM

## 2022-02-25 DIAGNOSIS — E1159 Type 2 diabetes mellitus with other circulatory complications: Secondary | ICD-10-CM | POA: Diagnosis not present

## 2022-02-25 DIAGNOSIS — R569 Unspecified convulsions: Secondary | ICD-10-CM | POA: Diagnosis not present

## 2022-02-25 DIAGNOSIS — F411 Generalized anxiety disorder: Secondary | ICD-10-CM

## 2022-02-25 DIAGNOSIS — I471 Supraventricular tachycardia, unspecified: Secondary | ICD-10-CM | POA: Diagnosis not present

## 2022-02-25 DIAGNOSIS — I110 Hypertensive heart disease with heart failure: Secondary | ICD-10-CM | POA: Diagnosis not present

## 2022-02-25 DIAGNOSIS — E113392 Type 2 diabetes mellitus with moderate nonproliferative diabetic retinopathy without macular edema, left eye: Secondary | ICD-10-CM

## 2022-02-25 DIAGNOSIS — E1169 Type 2 diabetes mellitus with other specified complication: Secondary | ICD-10-CM

## 2022-02-25 DIAGNOSIS — E1165 Type 2 diabetes mellitus with hyperglycemia: Secondary | ICD-10-CM

## 2022-02-25 DIAGNOSIS — R070 Pain in throat: Secondary | ICD-10-CM

## 2022-02-25 DIAGNOSIS — G8929 Other chronic pain: Secondary | ICD-10-CM

## 2022-02-25 DIAGNOSIS — F1320 Sedative, hypnotic or anxiolytic dependence, uncomplicated: Secondary | ICD-10-CM

## 2022-02-25 DIAGNOSIS — F339 Major depressive disorder, recurrent, unspecified: Secondary | ICD-10-CM

## 2022-02-25 LAB — BAYER DCA HB A1C WAIVED: HB A1C (BAYER DCA - WAIVED): 7.2 % — ABNORMAL HIGH (ref 4.8–5.6)

## 2022-02-25 MED ORDER — EMPAGLIFLOZIN 25 MG PO TABS: 25.0000 mg | ORAL_TABLET | Freq: Every day | ORAL | 3 refills | Status: DC

## 2022-02-25 MED ORDER — LORAZEPAM 0.5 MG PO TABS: 0.5000 mg | ORAL_TABLET | Freq: Two times a day (BID) | ORAL | 5 refills | Status: DC | PRN

## 2022-02-25 NOTE — Telephone Encounter (Signed)
Patient is calling to talk with Finis Bud or nurse. Please call back to discuss.

## 2022-02-25 NOTE — Progress Notes (Signed)
Established Patient Office Visit  Subjective:  [name], female    DOB: Nov 22, 1950  Age: 72 y.o. MRN: [id number]  CC:  Chief Complaint  Patient presents with   Medical Management of Chronic Issues   Diabetes    HPI  1. HTN Complaint with meds - Yes Current Medications - Coreg, lasix, and Entresto Checking BP at home: no Exercising Regularly - No Watching Salt intake - No Pertinent ROS:  Headache - No Fatigue - No Visual Disturbances - No Chest pain - No Dyspnea - No Palpitations - No LE edema - No  She has an upcoming transesophageal echo scheduled for later this week with cardiology.   2. Diabetes Pt presents for follow up evaluation of Type 2 diabetes mellitus.  Patient denies foot ulcerations, increased appetite, nausea, paresthesia of the feet, polydipsia, polyuria, visual disturbances, vomiting, and weight loss.  Current diabetic medications include jardiance 20 mg Compliant with meds - Yes  Current monitoring regimen: home blood tests - 2-3 times weekly Home blood sugar records: fasting range: 130s Any episodes of hypoglycemia? no  Eye exam current (within one year): no Weight trend: stable  Urine microalbumin UTD? Yes  Is She on ACE inhibitor or angiotensin II receptor blocker?  Yes, entresto Is She on statin? No declined Is She on ASA 81 mg daily?  Yes     3. Chronic throat pain She has been following with Dr. Benjamine Mola for chronic throat pain. She would like a new referral as Dr. Benjamine Mola is no longer treating throats.   4. Hx of seizures She continues on lamictal. Last seizure was many years ago.   5. Depression/anxiety Takes ativan BID and has for years. She takes this prn. She has started on trazodone and this has helped with sleep.      02/25/2022   10:47 AM 02/20/2022    1:15 PM 01/29/2022    9:50 AM  Depression screen PHQ 2/9  Decreased Interest 0 0 0  Down, Depressed, Hopeless 0 0 0  PHQ - 2 Score 0 0 0  Altered  sleeping 0 3 0  Tired, decreased energy 0 0 0  Change in appetite 0 0 0  Feeling bad or failure about yourself  0 0 0  Trouble concentrating 0 0 0  Moving slowly or fidgety/restless 0 0 0  Suicidal thoughts 0 0 0  PHQ-9 Score 0 3 0  Difficult doing work/chores Not difficult at all Not difficult at all Not difficult at all      02/25/2022   10:48 AM 02/20/2022    1:16 PM 01/29/2022    9:50 AM 12/28/2021   10:45 AM  GAD 7 : Generalized Anxiety Score  Nervous, Anxious, on Edge 0 0 0 0  Control/stop worrying 0 0 0 0  Worry too much - different things 0 0 0 0  Trouble relaxing 0 0 0 0  Restless 0 0 0 0  Easily annoyed or irritable 0 0 0 0  Afraid - awful might happen 0 0 0 0  Total GAD 7 Score 0 0 0 0  Anxiety Difficulty Not difficult at all Not difficult at all Not difficult at all Not difficult at all    Past Medical History:  Diagnosis Date   Asthma    AVNRT (AV nodal re-entry tachycardia)    Carotid artery disease (HCC)    Cirrhosis ([place])    Diverticulosis of colon (without mention of hemorrhage)    Essential hypertension  Functional ovarian cysts    GERD (gastroesophageal reflux disease)    Hemorrhoids, external, without mention of complication    Hormone replacement therapy (postmenopausal)    IBS (irritable bowel syndrome)    Migraine    Mitral valve disease    Pacemaker    Complete heart block - [employer]   Right knee DJD    Seizures (HCC)    Sleep apnea    TIA (transient ischemic attack)    Type 2 diabetes mellitus ([place])     Past Surgical History:  Procedure Laterality Date   BIOPSY  09/23/2018   Procedure: BIOPSY;  Surgeon: Rogene Houston, MD;  [facility];  Service: Endoscopy;;  duodeneum and stomach   CHOLECYSTECTOMY  1980   ENDARTERECTOMY Right 07/01/2017   Procedure: ENDARTERECTOMY CAROTID RIGHT;  Surgeon: Serafina Mitchell, MD;  Location: [facility];  Service: Vascular;  Laterality: Right;   ESOPHAGOGASTRODUODENOSCOPY N/A 09/23/2018   Procedure:  ESOPHAGOGASTRODUODENOSCOPY (EGD);  Surgeon: Rogene Houston, MD;  [facility];  Service: Endoscopy;  Laterality: N/A;  2:45-moved to 1:45 per Lelon Frohlich   ESOPHAGOGASTRODUODENOSCOPY (EGD) WITH PROPOFOL N/A 03/07/2021   Procedure: ESOPHAGOGASTRODUODENOSCOPY (EGD) WITH PROPOFOL;  Surgeon: Rogene Houston, MD;  [facility];  Service: Endoscopy;  Laterality: N/A;  12:50   LEFT HEART CATHETERIZATION WITH CORONARY ANGIOGRAM N/A 11/18/2013   Procedure: LEFT HEART CATHETERIZATION WITH CORONARY ANGIOGRAM;  Surgeon: Burnell Blanks, MD;  Location: [facility] CATH LAB;  Service: Cardiovascular;  Laterality: N/A;   OOPHORECTOMY     2012 Dr. Adah Perl in [place] N/A 12/14/2021   Procedure: PACEMAKER IMPLANT;  Surgeon: Evans Lance, MD;  Location: [place] CV LAB;  Service: Cardiovascular;  Laterality: N/A;   PATCH ANGIOPLASTY Right 07/01/2017   Procedure: RIGHT CAROTID PATCH ANGIOPLASTY;  Surgeon: Serafina Mitchell, MD;  Location: [facility];  Service: Vascular;  Laterality: Right;   SVT ABLATION N/A 12/14/2021   Procedure: SVT ABLATION;  Surgeon: Evans Lance, MD;  Location: [facility] CV LAB;  Service: Cardiovascular;  Laterality: N/A;   TONSILLECTOMY     TOTAL KNEE ARTHROPLASTY Right 09/02/2012   Procedure: TOTAL KNEE ARTHROPLASTY- right;  Surgeon: Ninetta Lights, MD;  Location: [place];  Service: Orthopedics;  Laterality: Right;   TUBAL LIGATION     TYMPANOMASTOIDECTOMY Left 04/02/2016   Procedure: LEFT CANAL WALL DOWN TYMPANOMASTOIDECTOMY;  Surgeon: Leta Baptist, MD;  Location: [facility];  Service: ENT;  Laterality: Left;   VAGINAL HYSTERECTOMY  1990   WOUND EXPLORATION Right 07/02/2017   Procedure: RIGHT NECK EXPLORATION WITH INTRAOPERATIVE ULTRASOUND;  Surgeon: Angelia Mould, MD;  Location: [facility] OR;  Service: Vascular;  Laterality: Right;    Family History  Problem Relation Age of Onset   Heart disease Mother    Diabetes Mother    Heart disease  Brother    Cancer Sister    Heart disease Brother    Heart disease Brother    Heart disease Son     Social History   Socioeconomic History   Marital status: Widowed    Spouse name: Not on file   Number of children: 3   Years of education: Not on file   Highest education level: Not on file  Occupational History   Occupation: retired  Tobacco Use   Smoking status: Never   Smokeless tobacco: Never  Vaping Use   Vaping Use: Never used  Substance and Sexual Activity   Alcohol use:  No    Alcohol/week: 0.0 standard drinks of alcohol   Drug use: No   Sexual activity: Not Currently  Other Topics Concern   Not on file  Social History Narrative   Lives alone. Granddaughter lives across the street, her 3 children all live nearby   Social Determinants of Health   Financial Resource Strain: Low Risk  (01/04/2022)   Overall Financial Resource Strain (CARDIA)    Difficulty of Paying Living Expenses: Not very hard  Food Insecurity: No Food Insecurity (01/04/2022)   Hunger Vital Sign    Worried About Running Out of Food in the Last Year: Never true    Ran Out of Food in the Last Year: Never true  Transportation Needs: No Transportation Needs (01/04/2022)   PRAPARE - Hydrologist (Medical): No    Lack of Transportation (Non-Medical): No  Physical Activity: Sufficiently Active (01/04/2022)   Exercise Vital Sign    Days of Exercise per Week: 5 days    Minutes of Exercise per Session: 30 min  Stress: No Stress Concern Present (01/04/2022)   [place]    Feeling of Stress : Not at all  Social Connections: Moderately Integrated (01/04/2022)   Social Connection and Isolation Panel [NHANES]    Frequency of Communication with Friends and Family: More than three times a week    Frequency of Social Gatherings with Friends and Family: More than three times a week    Attends Religious Services:  More than 4 times per year    Active Member of [employer] or Organizations: Yes    Attends Archivist Meetings: More than 4 times per year    Marital Status: Widowed  Intimate Partner Violence: Not At Risk (07/05/2021)   Humiliation, Afraid, Rape, and Kick questionnaire    Fear of Current or Ex-Partner: No    Emotionally Abused: No    Physically Abused: No    Sexually Abused: No    Outpatient Medications Prior to Visit  Medication Sig Dispense Refill   Accu-Chek Softclix Lancets lancets check blood sugars twice daily DX E11.65 200 each 3   acetaminophen (TYLENOL) 500 MG tablet Take 500 mg by mouth every 6 (six) hours as needed for mild pain.     aspirin EC 81 MG tablet Take 1 tablet (81 mg total) by mouth daily.     benzonatate (TESSALON) 200 MG capsule Take 1 capsule (200 mg total) by mouth 3 (three) times daily as needed. 30 capsule 3   carvedilol (COREG) 3.125 MG tablet TAKE (1) TABLET TWICE DAILY. 60 tablet 6   cetirizine (ZYRTEC) 10 MG tablet Take 1 tablet (10 mg total) by mouth daily. 30 tablet 11   cholecalciferol (VITAMIN D) 1000 UNITS tablet Take 1,000 Units by mouth daily.     empagliflozin (JARDIANCE) 10 MG TABS tablet Take 1 tablet (10 mg total) by mouth daily before breakfast. (Patient taking differently: Take 20 mg by mouth daily before breakfast.) 90 tablet 3   fluticasone (FLONASE) 50 MCG/ACT nasal spray Place 2 sprays into both nostrils as needed for allergies or rhinitis.     furosemide (LASIX) 40 MG tablet Take 1 tablet (40 mg total) by mouth daily. 90 tablet 3   gabapentin (NEURONTIN) 300 MG capsule Take 300 mg by mouth 3 (three) times daily as needed (Nerve in throat).     glucose blood (ACCU-CHEK GUIDE) test strip Use as instructed to test blood sugar daily as  directed. DX:E11.9 50 each 12   lactulose (CHRONULAC) 10 GM/15ML solution TAKE 30 MLS (20 GRAM TOTAL) BY MOUTH 3 TIMES A DAY. 2700 mL 0   lamoTRIgine (LAMICTAL) 200 MG tablet TAKE (1) TABLET TWICE DAILY. 180  tablet 3   lidocaine (XYLOCAINE) 2 % solution Use as directed 10 mLs in the mouth or throat every 6 (six) hours as needed for mouth pain. 100 mL 0   LORazepam (ATIVAN) 0.5 MG tablet Take 1 tablet (0.5 mg total) by mouth 2 (two) times daily as needed for anxiety. 30 tablet 5   magic mouthwash (nystatin, hydrocortisone, diphenhydrAMINE) suspension Swish and spit 10 mLs 3 (three) times daily. 540 mL 0   magic mouthwash (nystatin, lidocaine, diphenhydrAMINE, alum & mag hydroxide) suspension Swish and swallow 5 mLs 3 (three) times daily. 180 mL 0   Multiple Vitamins-Minerals (MULTIVITAMIN WITH MINERALS) tablet Take 1 tablet by mouth daily.     mupirocin ointment (BACTROBAN) 2 % Apply 1 Application topically 2 (two) times daily for 7 days. 22 g 0   naloxone (NARCAN) nasal spray 4 mg/0.1 mL Place 1 spray into the nose once. Take by nasal route every 3 minutes until patient awakes or EMS arrives.     ondansetron (ZOFRAN) 4 MG tablet TAKE 1 TABLET BY MOUTH EVERY 8 HOURS AS NEEDED FOR NAUSEA AND VOMITING. 20 tablet 0   pantoprazole (PROTONIX) 40 MG tablet TAKE (1) TABLET TWICE DAILY. 60 tablet 5   potassium chloride SA (KLOR-CON M) 20 MEQ tablet Take 2 tablets (40 mEq total) by mouth daily. 90 tablet 3   sacubitril-valsartan (ENTRESTO) 24-26 MG Take 1 tablet by mouth 2 (two) times daily. 180 tablet 3   traMADol (ULTRAM) 50 MG tablet Take 50 mg by mouth as needed.     traZODone (DESYREL) 50 MG tablet Take 0.5-1 tablets (25-50 mg total) by mouth at bedtime as needed for sleep. 30 tablet 3   HYDROcodone-acetaminophen (NORCO) 10-325 MG tablet Take 1 tablet by mouth every 6 (six) hours as needed for severe pain (Arthritis on neck).     No facility-administered medications prior to visit.    No Known Allergies  ROS Review of Systems Negative unless specially indicated above in HPI.   Objective:    Physical Exam Vitals and nursing note reviewed.  Constitutional:      General: She is not in acute  distress.    Appearance: She is not ill-appearing, toxic-appearing or diaphoretic.  HENT:     Head: Normocephalic and atraumatic.     Nose: Nose normal.  Eyes:     Conjunctiva/sclera: Conjunctivae normal.     Pupils: Pupils are equal, round, and reactive to light.  Cardiovascular:     Rate and Rhythm: Normal rate and regular rhythm.     Heart sounds: Normal heart sounds. No murmur heard. Pulmonary:     Effort: Pulmonary effort is normal. No respiratory distress.     Breath sounds: Normal breath sounds.  Abdominal:     General: Bowel sounds are normal. There is no distension.     Palpations: Abdomen is soft.     Tenderness: There is no abdominal tenderness. There is no guarding or rebound.  Musculoskeletal:     Right lower leg: No edema.     Left lower leg: No edema.  Skin:    General: Skin is warm and dry.  Neurological:     General: No focal deficit present.     Mental Status: She is alert and oriented to  person, place, and time.  Psychiatric:        Mood and Affect: Mood normal.        Behavior: Behavior normal.     BP 102/61   Pulse 73   Temp 98.1 F (36.7 C) (Temporal)   Ht '5\' 4"'$  (1.626 m)   Wt 162 lb (73.5 kg)   SpO2 96%   BMI 27.81 kg/m  Wt Readings from Last 3 Encounters:  02/25/22 162 lb (73.5 kg)  02/20/22 163 lb 4 oz (74 kg)  01/29/22 166 lb 3.2 oz (75.4 kg)    Diabetic Foot Exam - Simple   No data filed     Health Maintenance Due  Topic Date Due   OPHTHALMOLOGY EXAM  Never done   DTaP/Tdap/Td (2 - Td or Tdap) 12/12/2012   COVID-19 Vaccine (4 - 2023-24 season) 10/19/2021    There are no preventive care reminders to display for this patient.  Lab Results  Component Value Date   TSH 1.066 09/29/2021   Lab Results  Component Value Date   WBC 7.7 12/14/2021   HGB 13.3 12/14/2021   HCT 41.3 12/14/2021   MCV 89.6 12/14/2021   PLT 163 12/14/2021   Lab Results  Component Value Date   NA 138 11/19/2021   K 4.2 11/19/2021   CO2 22  11/19/2021   GLUCOSE 172 (H) 11/19/2021   BUN 13 11/19/2021   CREATININE 0.96 11/19/2021   BILITOT 0.4 10/18/2021   ALKPHOS 112 09/12/2021   AST 32 10/18/2021   ALT 19 10/18/2021   PROT 7.2 10/18/2021   ALBUMIN 4.3 09/12/2021   CALCIUM 9.3 11/19/2021   ANIONGAP 7 09/30/2021   EGFR 64 11/19/2021   Lab Results  Component Value Date   CHOL 213 (H) 09/12/2021   Lab Results  Component Value Date   HDL 47 09/12/2021   Lab Results  Component Value Date   LDLCALC 102 (H) 09/12/2021   Lab Results  Component Value Date   TRIG 380 (H) 09/12/2021   Lab Results  Component Value Date   CHOLHDL 4.5 (H) 09/12/2021   Lab Results  Component Value Date   HGBA1C 6.8 (H) 09/29/2021      Assessment & Plan:   Libby was seen today for medical management of chronic issues and diabetes.  Diagnoses and all orders for this visit:  Type 2 diabetes mellitus with hyperglycemia, without long-term current use of insulin (HCC) A1c is 7.2 today, not at goal of <7. She has only been taking 20 mg of jardiane, will increase to 25 mg daily. Reminded to schedule eye exam. Foot exam and micro are UTD. On ARB. Declined statin.  -     Bayer DCA Hb A1c Waived -     empagliflozin (JARDIANCE) 25 MG TABS tablet; Take 1 tablet (25 mg total) by mouth daily before breakfast.  Hypertension associated with type 2 diabetes mellitus ([place]) Well controlled on current regimen. On coreg, entresto, lasix.   Hyperlipidemia associated with type 2 diabetes mellitus ([place]) Last LDL was not at goal. She has declined statin.   Chronic HFrEF (heart failure with reduced ejection fraction) ([place]) Managed by cardiology. Continue jardiance.  -     empagliflozin (JARDIANCE) 25 MG TABS tablet; Take 1 tablet (25 mg total) by mouth daily before breakfast.  SVT (supraventricular tachycardia) S/P placement of cardiac pacemaker Managed by cardiology.   Moderate nonproliferative diabetic retinopathy of left eye without macular  edema associated with type 2 diabetes mellitus ([place]) Reminded  to schedule eye exam.   Cirrhosis, nonalcoholic ([place]) Managed by GI. LFTs have been normal. On lactulose.   Partial seizure ([place]) Well controlled on current regimen. Continue lamictal.   Chronic insomnia Improving with trazodone.   Generalized anxiety disorder Benzodiazepine dependence ([place]) Controlled substance agreement signed Well controlled on current regimen. PDMP reviewed, no red flags. CSA and UDS are UTD.  -     LORazepam (ATIVAN) 0.5 MG tablet; Take 1 tablet (0.5 mg total) by mouth 2 (two) times daily as needed for anxiety.  Depression, recurrent ([place]) Well controlled without medication currently.   Chronic throat pain Referral placed.  -     Ambulatory referral to ENT   Follow-up: Return in about 3 months (around 05/27/2022) for chronic follow up.   The patient indicates understanding of these issues and agrees with the plan.   Gwenlyn Perking, FNP

## 2022-02-25 NOTE — H&P (View-Only) (Signed)
Established Patient Office Visit  Subjective:  [name], female    DOB: 26-Apr-1950  Age: 72 y.o. MRN: [id number]  CC:  Chief Complaint  Patient presents with   Medical Management of Chronic Issues   Diabetes    HPI  1. HTN Complaint with meds - Yes Current Medications - Coreg, lasix, and Entresto Checking BP at home: no Exercising Regularly - No Watching Salt intake - No Pertinent ROS:  Headache - No Fatigue - No Visual Disturbances - No Chest pain - No Dyspnea - No Palpitations - No LE edema - No  She has an upcoming transesophageal echo scheduled for later this week with cardiology.   2. Diabetes Pt presents for follow up evaluation of Type 2 diabetes mellitus.  Patient denies foot ulcerations, increased appetite, nausea, paresthesia of the feet, polydipsia, polyuria, visual disturbances, vomiting, and weight loss.  Current diabetic medications include jardiance 20 mg Compliant with meds - Yes  Current monitoring regimen: home blood tests - 2-3 times weekly Home blood sugar records: fasting range: 130s Any episodes of hypoglycemia? no  Eye exam current (within one year): no Weight trend: stable  Urine microalbumin UTD? Yes  Is She on ACE inhibitor or angiotensin II receptor blocker?  Yes, entresto Is She on statin? No declined Is She on ASA 81 mg daily?  Yes     3. Chronic throat pain She has been following with Dr. Benjamine Mola for chronic throat pain. She would like a new referral as Dr. Benjamine Mola is no longer treating throats.   4. Hx of seizures She continues on lamictal. Last seizure was many years ago.   5. Depression/anxiety Takes ativan BID and has for years. She takes this prn. She has started on trazodone and this has helped with sleep.      02/25/2022   10:47 AM 02/20/2022    1:15 PM 01/29/2022    9:50 AM  Depression screen PHQ 2/9  Decreased Interest 0 0 0  Down, Depressed, Hopeless 0 0 0  PHQ - 2 Score 0 0 0  Altered  sleeping 0 3 0  Tired, decreased energy 0 0 0  Change in appetite 0 0 0  Feeling bad or failure about yourself  0 0 0  Trouble concentrating 0 0 0  Moving slowly or fidgety/restless 0 0 0  Suicidal thoughts 0 0 0  PHQ-9 Score 0 3 0  Difficult doing work/chores Not difficult at all Not difficult at all Not difficult at all      02/25/2022   10:48 AM 02/20/2022    1:16 PM 01/29/2022    9:50 AM 12/28/2021   10:45 AM  GAD 7 : Generalized Anxiety Score  Nervous, Anxious, on Edge 0 0 0 0  Control/stop worrying 0 0 0 0  Worry too much - different things 0 0 0 0  Trouble relaxing 0 0 0 0  Restless 0 0 0 0  Easily annoyed or irritable 0 0 0 0  Afraid - awful might happen 0 0 0 0  Total GAD 7 Score 0 0 0 0  Anxiety Difficulty Not difficult at all Not difficult at all Not difficult at all Not difficult at all    Past Medical History:  Diagnosis Date   Asthma    AVNRT (AV nodal re-entry tachycardia)    Carotid artery disease (HCC)    Cirrhosis ([place])    Diverticulosis of colon (without mention of hemorrhage)    Essential hypertension  Functional ovarian cysts    GERD (gastroesophageal reflux disease)    Hemorrhoids, external, without mention of complication    Hormone replacement therapy (postmenopausal)    IBS (irritable bowel syndrome)    Migraine    Mitral valve disease    Pacemaker    Complete heart block - [employer]   Right knee DJD    Seizures (HCC)    Sleep apnea    TIA (transient ischemic attack)    Type 2 diabetes mellitus ([place])     Past Surgical History:  Procedure Laterality Date   BIOPSY  09/23/2018   Procedure: BIOPSY;  Surgeon: Rogene Houston, MD;  [facility];  Service: Endoscopy;;  duodeneum and stomach   CHOLECYSTECTOMY  1980   ENDARTERECTOMY Right 07/01/2017   Procedure: ENDARTERECTOMY CAROTID RIGHT;  Surgeon: Serafina Mitchell, MD;  Location: [facility];  Service: Vascular;  Laterality: Right;   ESOPHAGOGASTRODUODENOSCOPY N/A 09/23/2018   Procedure:  ESOPHAGOGASTRODUODENOSCOPY (EGD);  Surgeon: Rogene Houston, MD;  [facility];  Service: Endoscopy;  Laterality: N/A;  2:45-moved to 1:45 per Lelon Frohlich   ESOPHAGOGASTRODUODENOSCOPY (EGD) WITH PROPOFOL N/A 03/07/2021   Procedure: ESOPHAGOGASTRODUODENOSCOPY (EGD) WITH PROPOFOL;  Surgeon: Rogene Houston, MD;  [facility];  Service: Endoscopy;  Laterality: N/A;  12:50   LEFT HEART CATHETERIZATION WITH CORONARY ANGIOGRAM N/A 11/18/2013   Procedure: LEFT HEART CATHETERIZATION WITH CORONARY ANGIOGRAM;  Surgeon: Burnell Blanks, MD;  Location: [facility] CATH LAB;  Service: Cardiovascular;  Laterality: N/A;   OOPHORECTOMY     2012 Dr. Adah Perl in [place] N/A 12/14/2021   Procedure: PACEMAKER IMPLANT;  Surgeon: Evans Lance, MD;  Location: [facility] CV LAB;  Service: Cardiovascular;  Laterality: N/A;   PATCH ANGIOPLASTY Right 07/01/2017   Procedure: RIGHT CAROTID PATCH ANGIOPLASTY;  Surgeon: Serafina Mitchell, MD;  Location: [facility];  Service: Vascular;  Laterality: Right;   SVT ABLATION N/A 12/14/2021   Procedure: SVT ABLATION;  Surgeon: Evans Lance, MD;  Location: [place] CV LAB;  Service: Cardiovascular;  Laterality: N/A;   TONSILLECTOMY     TOTAL KNEE ARTHROPLASTY Right 09/02/2012   Procedure: TOTAL KNEE ARTHROPLASTY- right;  Surgeon: Ninetta Lights, MD;  Location: [place];  Service: Orthopedics;  Laterality: Right;   TUBAL LIGATION     TYMPANOMASTOIDECTOMY Left 04/02/2016   Procedure: LEFT CANAL WALL DOWN TYMPANOMASTOIDECTOMY;  Surgeon: Leta Baptist, MD;  Location: [place];  Service: ENT;  Laterality: Left;   VAGINAL HYSTERECTOMY  1990   WOUND EXPLORATION Right 07/02/2017   Procedure: RIGHT NECK EXPLORATION WITH INTRAOPERATIVE ULTRASOUND;  Surgeon: Angelia Mould, MD;  Location: [facility] OR;  Service: Vascular;  Laterality: Right;    Family History  Problem Relation Age of Onset   Heart disease Mother    Diabetes Mother    Heart disease  Brother    Cancer Sister    Heart disease Brother    Heart disease Brother    Heart disease Son     Social History   Socioeconomic History   Marital status: Widowed    Spouse name: Not on file   Number of children: 3   Years of education: Not on file   Highest education level: Not on file  Occupational History   Occupation: retired  Tobacco Use   Smoking status: Never   Smokeless tobacco: Never  Vaping Use   Vaping Use: Never used  Substance and Sexual Activity   Alcohol use:  No    Alcohol/week: 0.0 standard drinks of alcohol   Drug use: No   Sexual activity: Not Currently  Other Topics Concern   Not on file  Social History Narrative   Lives alone. Granddaughter lives across the street, her 3 children all live nearby   Social Determinants of Health   Financial Resource Strain: Low Risk  (01/04/2022)   Overall Financial Resource Strain (CARDIA)    Difficulty of Paying Living Expenses: Not very hard  Food Insecurity: No Food Insecurity (01/04/2022)   Hunger Vital Sign    Worried About Running Out of Food in the Last Year: Never true    Ran Out of Food in the Last Year: Never true  Transportation Needs: No Transportation Needs (01/04/2022)   PRAPARE - Hydrologist (Medical): No    Lack of Transportation (Non-Medical): No  Physical Activity: Sufficiently Active (01/04/2022)   Exercise Vital Sign    Days of Exercise per Week: 5 days    Minutes of Exercise per Session: 30 min  Stress: No Stress Concern Present (01/04/2022)   [place]    Feeling of Stress : Not at all  Social Connections: Moderately Integrated (01/04/2022)   Social Connection and Isolation Panel [NHANES]    Frequency of Communication with Friends and Family: More than three times a week    Frequency of Social Gatherings with Friends and Family: More than three times a week    Attends Religious Services:  More than 4 times per year    Active Member of [employer] or Organizations: Yes    Attends Archivist Meetings: More than 4 times per year    Marital Status: Widowed  Intimate Partner Violence: Not At Risk (07/05/2021)   Humiliation, Afraid, Rape, and Kick questionnaire    Fear of Current or Ex-Partner: No    Emotionally Abused: No    Physically Abused: No    Sexually Abused: No    Outpatient Medications Prior to Visit  Medication Sig Dispense Refill   Accu-Chek Softclix Lancets lancets check blood sugars twice daily DX E11.65 200 each 3   acetaminophen (TYLENOL) 500 MG tablet Take 500 mg by mouth every 6 (six) hours as needed for mild pain.     aspirin EC 81 MG tablet Take 1 tablet (81 mg total) by mouth daily.     benzonatate (TESSALON) 200 MG capsule Take 1 capsule (200 mg total) by mouth 3 (three) times daily as needed. 30 capsule 3   carvedilol (COREG) 3.125 MG tablet TAKE (1) TABLET TWICE DAILY. 60 tablet 6   cetirizine (ZYRTEC) 10 MG tablet Take 1 tablet (10 mg total) by mouth daily. 30 tablet 11   cholecalciferol (VITAMIN D) 1000 UNITS tablet Take 1,000 Units by mouth daily.     empagliflozin (JARDIANCE) 10 MG TABS tablet Take 1 tablet (10 mg total) by mouth daily before breakfast. (Patient taking differently: Take 20 mg by mouth daily before breakfast.) 90 tablet 3   fluticasone (FLONASE) 50 MCG/ACT nasal spray Place 2 sprays into both nostrils as needed for allergies or rhinitis.     furosemide (LASIX) 40 MG tablet Take 1 tablet (40 mg total) by mouth daily. 90 tablet 3   gabapentin (NEURONTIN) 300 MG capsule Take 300 mg by mouth 3 (three) times daily as needed (Nerve in throat).     glucose blood (ACCU-CHEK GUIDE) test strip Use as instructed to test blood sugar daily as  directed. DX:E11.9 50 each 12   lactulose (CHRONULAC) 10 GM/15ML solution TAKE 30 MLS (20 GRAM TOTAL) BY MOUTH 3 TIMES A DAY. 2700 mL 0   lamoTRIgine (LAMICTAL) 200 MG tablet TAKE (1) TABLET TWICE DAILY. 180  tablet 3   lidocaine (XYLOCAINE) 2 % solution Use as directed 10 mLs in the mouth or throat every 6 (six) hours as needed for mouth pain. 100 mL 0   LORazepam (ATIVAN) 0.5 MG tablet Take 1 tablet (0.5 mg total) by mouth 2 (two) times daily as needed for anxiety. 30 tablet 5   magic mouthwash (nystatin, hydrocortisone, diphenhydrAMINE) suspension Swish and spit 10 mLs 3 (three) times daily. 540 mL 0   magic mouthwash (nystatin, lidocaine, diphenhydrAMINE, alum & mag hydroxide) suspension Swish and swallow 5 mLs 3 (three) times daily. 180 mL 0   Multiple Vitamins-Minerals (MULTIVITAMIN WITH MINERALS) tablet Take 1 tablet by mouth daily.     mupirocin ointment (BACTROBAN) 2 % Apply 1 Application topically 2 (two) times daily for 7 days. 22 g 0   naloxone (NARCAN) nasal spray 4 mg/0.1 mL Place 1 spray into the nose once. Take by nasal route every 3 minutes until patient awakes or EMS arrives.     ondansetron (ZOFRAN) 4 MG tablet TAKE 1 TABLET BY MOUTH EVERY 8 HOURS AS NEEDED FOR NAUSEA AND VOMITING. 20 tablet 0   pantoprazole (PROTONIX) 40 MG tablet TAKE (1) TABLET TWICE DAILY. 60 tablet 5   potassium chloride SA (KLOR-CON M) 20 MEQ tablet Take 2 tablets (40 mEq total) by mouth daily. 90 tablet 3   sacubitril-valsartan (ENTRESTO) 24-26 MG Take 1 tablet by mouth 2 (two) times daily. 180 tablet 3   traMADol (ULTRAM) 50 MG tablet Take 50 mg by mouth as needed.     traZODone (DESYREL) 50 MG tablet Take 0.5-1 tablets (25-50 mg total) by mouth at bedtime as needed for sleep. 30 tablet 3   HYDROcodone-acetaminophen (NORCO) 10-325 MG tablet Take 1 tablet by mouth every 6 (six) hours as needed for severe pain (Arthritis on neck).     No facility-administered medications prior to visit.    No Known Allergies  ROS Review of Systems Negative unless specially indicated above in HPI.   Objective:    Physical Exam Vitals and nursing note reviewed.  Constitutional:      General: She is not in acute  distress.    Appearance: She is not ill-appearing, toxic-appearing or diaphoretic.  HENT:     Head: Normocephalic and atraumatic.     Nose: Nose normal.  Eyes:     Conjunctiva/sclera: Conjunctivae normal.     Pupils: Pupils are equal, round, and reactive to light.  Cardiovascular:     Rate and Rhythm: Normal rate and regular rhythm.     Heart sounds: Normal heart sounds. No murmur heard. Pulmonary:     Effort: Pulmonary effort is normal. No respiratory distress.     Breath sounds: Normal breath sounds.  Abdominal:     General: Bowel sounds are normal. There is no distension.     Palpations: Abdomen is soft.     Tenderness: There is no abdominal tenderness. There is no guarding or rebound.  Musculoskeletal:     Right lower leg: No edema.     Left lower leg: No edema.  Skin:    General: Skin is warm and dry.  Neurological:     General: No focal deficit present.     Mental Status: She is alert and oriented to  person, place, and time.  Psychiatric:        Mood and Affect: Mood normal.        Behavior: Behavior normal.     BP 102/61   Pulse 73   Temp 98.1 F (36.7 C) (Temporal)   Ht '5\' 4"'$  (1.626 m)   Wt 162 lb (73.5 kg)   SpO2 96%   BMI 27.81 kg/m  Wt Readings from Last 3 Encounters:  02/25/22 162 lb (73.5 kg)  02/20/22 163 lb 4 oz (74 kg)  01/29/22 166 lb 3.2 oz (75.4 kg)    Diabetic Foot Exam - Simple   No data filed     Health Maintenance Due  Topic Date Due   OPHTHALMOLOGY EXAM  Never done   DTaP/Tdap/Td (2 - Td or Tdap) 12/12/2012   COVID-19 Vaccine (4 - 2023-24 season) 10/19/2021    There are no preventive care reminders to display for this patient.  Lab Results  Component Value Date   TSH 1.066 09/29/2021   Lab Results  Component Value Date   WBC 7.7 12/14/2021   HGB 13.3 12/14/2021   HCT 41.3 12/14/2021   MCV 89.6 12/14/2021   PLT 163 12/14/2021   Lab Results  Component Value Date   NA 138 11/19/2021   K 4.2 11/19/2021   CO2 22  11/19/2021   GLUCOSE 172 (H) 11/19/2021   BUN 13 11/19/2021   CREATININE 0.96 11/19/2021   BILITOT 0.4 10/18/2021   ALKPHOS 112 09/12/2021   AST 32 10/18/2021   ALT 19 10/18/2021   PROT 7.2 10/18/2021   ALBUMIN 4.3 09/12/2021   CALCIUM 9.3 11/19/2021   ANIONGAP 7 09/30/2021   EGFR 64 11/19/2021   Lab Results  Component Value Date   CHOL 213 (H) 09/12/2021   Lab Results  Component Value Date   HDL 47 09/12/2021   Lab Results  Component Value Date   LDLCALC 102 (H) 09/12/2021   Lab Results  Component Value Date   TRIG 380 (H) 09/12/2021   Lab Results  Component Value Date   CHOLHDL 4.5 (H) 09/12/2021   Lab Results  Component Value Date   HGBA1C 6.8 (H) 09/29/2021      Assessment & Plan:   Carol Richmond was seen today for medical management of chronic issues and diabetes.  Diagnoses and all orders for this visit:  Type 2 diabetes mellitus with hyperglycemia, without long-term current use of insulin (HCC) A1c is 7.2 today, not at goal of <7. She has only been taking 20 mg of jardiane, will increase to 25 mg daily. Reminded to schedule eye exam. Foot exam and micro are UTD. On ARB. Declined statin.  -     Bayer DCA Hb A1c Waived -     empagliflozin (JARDIANCE) 25 MG TABS tablet; Take 1 tablet (25 mg total) by mouth daily before breakfast.  Hypertension associated with type 2 diabetes mellitus ([place]) Well controlled on current regimen. On coreg, entresto, lasix.   Hyperlipidemia associated with type 2 diabetes mellitus ([place]) Last LDL was not at goal. She has declined statin.   Chronic HFrEF (heart failure with reduced ejection fraction) ([place]) Managed by cardiology. Continue jardiance.  -     empagliflozin (JARDIANCE) 25 MG TABS tablet; Take 1 tablet (25 mg total) by mouth daily before breakfast.  SVT (supraventricular tachycardia) S/P placement of cardiac pacemaker Managed by cardiology.   Moderate nonproliferative diabetic retinopathy of left eye without macular  edema associated with type 2 diabetes mellitus ([place]) Reminded  to schedule eye exam.   Cirrhosis, nonalcoholic ([place]) Managed by GI. LFTs have been normal. On lactulose.   Partial seizure ([place]) Well controlled on current regimen. Continue lamictal.   Chronic insomnia Improving with trazodone.   Generalized anxiety disorder Benzodiazepine dependence ([place]) Controlled substance agreement signed Well controlled on current regimen. PDMP reviewed, no red flags. CSA and UDS are UTD.  -     LORazepam (ATIVAN) 0.5 MG tablet; Take 1 tablet (0.5 mg total) by mouth 2 (two) times daily as needed for anxiety.  Depression, recurrent ([place]) Well controlled without medication currently.   Chronic throat pain Referral placed.  -     Ambulatory referral to ENT   Follow-up: Return in about 3 months (around 05/27/2022) for chronic follow up.   The patient indicates understanding of these issues and agrees with the plan.   Gwenlyn Perking, FNP

## 2022-02-25 NOTE — Telephone Encounter (Signed)
Patient is asking if she needed to have someone stay with her on Friday after her procedure. Advised that this would be reasonable but not necessary. Verbalized understanding.

## 2022-02-25 NOTE — Telephone Encounter (Signed)
I called and spoke to pt and she states Carol Richmond told her they can't fill the Jardiance since there isn't a generic and they could transfer it to [facility]. Pt aware to have them transfer it to [facility] so she can pick it up.

## 2022-02-26 ENCOUNTER — Telehealth: Payer: Self-pay | Admitting: Family Medicine

## 2022-02-26 NOTE — Telephone Encounter (Signed)
Called and spoke with pt , she is coming by to pick up samples from up front   2 was provider and documented in binder

## 2022-02-27 DIAGNOSIS — K140 Glossitis: Secondary | ICD-10-CM | POA: Diagnosis not present

## 2022-02-27 DIAGNOSIS — K146 Glossodynia: Secondary | ICD-10-CM | POA: Diagnosis not present

## 2022-02-28 ENCOUNTER — Telehealth: Payer: Self-pay | Admitting: Family Medicine

## 2022-02-28 ENCOUNTER — Telehealth: Payer: Self-pay | Admitting: Cardiology

## 2022-02-28 NOTE — Telephone Encounter (Signed)
Patient would like to speak to the nurse prior to the procedure with what she has been experiencing. Please advise

## 2022-02-28 NOTE — Telephone Encounter (Signed)
Contacted and patient wanted to confirm appointment for TEE tomorrow at 10:00 am. Advised that she is still scheduled for TEE tomorrow. Verbalized understanding.

## 2022-02-28 NOTE — Telephone Encounter (Signed)
Pt has enough jardiance to last until she is seen on 03/06/22, does not need samples

## 2022-03-01 ENCOUNTER — Ambulatory Visit (HOSPITAL_BASED_OUTPATIENT_CLINIC_OR_DEPARTMENT_OTHER): Payer: Medicare HMO | Admitting: Certified Registered"

## 2022-03-01 ENCOUNTER — Ambulatory Visit (HOSPITAL_BASED_OUTPATIENT_CLINIC_OR_DEPARTMENT_OTHER)
Admission: RE | Admit: 2022-03-01 | Discharge: 2022-03-01 | Disposition: A | Discharge status: Home / Self Care | Payer: Medicare HMO | Source: Ambulatory Visit | Attending: Cardiology | Admitting: Cardiology

## 2022-03-01 ENCOUNTER — Encounter (HOSPITAL_COMMUNITY): Payer: Self-pay | Admitting: Cardiovascular Disease

## 2022-03-01 ENCOUNTER — Telehealth: Payer: Medicare HMO

## 2022-03-01 ENCOUNTER — Ambulatory Visit (HOSPITAL_COMMUNITY): Payer: Medicare HMO | Admitting: Certified Registered"

## 2022-03-01 ENCOUNTER — Encounter (HOSPITAL_COMMUNITY)
Admission: RE | Disposition: A | Discharge status: Home / Self Care | Payer: Medicare HMO | Source: Home / Self Care | Attending: Cardiovascular Disease

## 2022-03-01 ENCOUNTER — Ambulatory Visit (HOSPITAL_COMMUNITY)
Admission: RE | Admit: 2022-03-01 | Discharge: 2022-03-01 | Disposition: A | Discharge status: Home / Self Care | Payer: Medicare HMO | Attending: Cardiovascular Disease | Admitting: Cardiovascular Disease

## 2022-03-01 ENCOUNTER — Other Ambulatory Visit: Payer: Self-pay

## 2022-03-01 DIAGNOSIS — I5022 Chronic systolic (congestive) heart failure: Secondary | ICD-10-CM | POA: Insufficient documentation

## 2022-03-01 DIAGNOSIS — I059 Rheumatic mitral valve disease, unspecified: Secondary | ICD-10-CM | POA: Insufficient documentation

## 2022-03-01 DIAGNOSIS — I340 Nonrheumatic mitral (valve) insufficiency: Secondary | ICD-10-CM

## 2022-03-01 DIAGNOSIS — R070 Pain in throat: Secondary | ICD-10-CM | POA: Diagnosis not present

## 2022-03-01 DIAGNOSIS — J45909 Unspecified asthma, uncomplicated: Secondary | ICD-10-CM

## 2022-03-01 DIAGNOSIS — F32A Depression, unspecified: Secondary | ICD-10-CM | POA: Diagnosis not present

## 2022-03-01 DIAGNOSIS — I509 Heart failure, unspecified: Secondary | ICD-10-CM | POA: Diagnosis not present

## 2022-03-01 DIAGNOSIS — I342 Nonrheumatic mitral (valve) stenosis: Secondary | ICD-10-CM | POA: Diagnosis not present

## 2022-03-01 DIAGNOSIS — Z79899 Other long term (current) drug therapy: Secondary | ICD-10-CM | POA: Insufficient documentation

## 2022-03-01 DIAGNOSIS — F419 Anxiety disorder, unspecified: Secondary | ICD-10-CM | POA: Insufficient documentation

## 2022-03-01 DIAGNOSIS — G4730 Sleep apnea, unspecified: Secondary | ICD-10-CM | POA: Insufficient documentation

## 2022-03-01 DIAGNOSIS — Z7984 Long term (current) use of oral hypoglycemic drugs: Secondary | ICD-10-CM | POA: Insufficient documentation

## 2022-03-01 DIAGNOSIS — I700 Atherosclerosis of aorta: Secondary | ICD-10-CM | POA: Insufficient documentation

## 2022-03-01 DIAGNOSIS — E119 Type 2 diabetes mellitus without complications: Secondary | ICD-10-CM | POA: Insufficient documentation

## 2022-03-01 DIAGNOSIS — I050 Rheumatic mitral stenosis: Secondary | ICD-10-CM

## 2022-03-01 DIAGNOSIS — I083 Combined rheumatic disorders of mitral, aortic and tricuspid valves: Secondary | ICD-10-CM | POA: Diagnosis not present

## 2022-03-01 DIAGNOSIS — R569 Unspecified convulsions: Secondary | ICD-10-CM | POA: Diagnosis not present

## 2022-03-01 DIAGNOSIS — I110 Hypertensive heart disease with heart failure: Secondary | ICD-10-CM | POA: Diagnosis not present

## 2022-03-01 DIAGNOSIS — Z7982 Long term (current) use of aspirin: Secondary | ICD-10-CM | POA: Diagnosis not present

## 2022-03-01 DIAGNOSIS — I052 Rheumatic mitral stenosis with insufficiency: Secondary | ICD-10-CM

## 2022-03-01 HISTORY — PX: TEE WITHOUT CARDIOVERSION: SHX5443

## 2022-03-01 LAB — POCT I-STAT, CHEM 8
BUN: 11 mg/dL (ref 8–23)
Calcium, Ion: 1.22 mmol/L (ref 1.15–1.40)
Chloride: 103 mmol/L (ref 98–111)
Creatinine, Ser: 0.9 mg/dL (ref 0.44–1.00)
Glucose, Bld: 167 mg/dL — ABNORMAL HIGH (ref 70–99)
HCT: 44 % (ref 36.0–46.0)
Hemoglobin: 15 g/dL (ref 12.0–15.0)
Potassium: 4.2 mmol/L (ref 3.5–5.1)
Sodium: 139 mmol/L (ref 135–145)
TCO2: 24 mmol/L (ref 22–32)

## 2022-03-01 LAB — ECHO TEE
Area-P 1/2: 1.82 cm2
MV M vel: 4.04 m/s
MV Peak grad: 65.3 mmHg
MV VTI: 2.4 cm2
Radius: 0.75 cm

## 2022-03-01 SURGERY — ECHOCARDIOGRAM, TRANSESOPHAGEAL
Anesthesia: Monitor Anesthesia Care

## 2022-03-01 MED ORDER — PHENYLEPHRINE 80 MCG/ML (10ML) SYRINGE FOR IV PUSH (FOR BLOOD PRESSURE SUPPORT)
PREFILLED_SYRINGE | INTRAVENOUS | Status: DC | PRN
  Administered 2022-03-01 (×4): 80 ug via INTRAVENOUS

## 2022-03-01 MED ORDER — SODIUM CHLORIDE 0.9 % IV SOLN: INTRAVENOUS | Status: DC

## 2022-03-01 MED ORDER — PROPOFOL 500 MG/50ML IV EMUL
INTRAVENOUS | Status: DC | PRN
  Administered 2022-03-01: 200 ug/kg/min via INTRAVENOUS

## 2022-03-01 MED ORDER — LIDOCAINE 2% (20 MG/ML) 5 ML SYRINGE
INTRAMUSCULAR | Status: DC | PRN
  Administered 2022-03-01: 100 mg via INTRAVENOUS

## 2022-03-01 MED ORDER — PROPOFOL 10 MG/ML IV BOLUS
INTRAVENOUS | Status: DC | PRN
  Administered 2022-03-01: 40 mg via INTRAVENOUS
  Administered 2022-03-01: 60 mg via INTRAVENOUS

## 2022-03-01 MED ORDER — PHENYLEPHRINE HCL-NACL 20-0.9 MG/250ML-% IV SOLN: INTRAVENOUS | Status: DC | PRN

## 2022-03-01 NOTE — Op Note (Signed)
INDICATIONS: rheumatic heart disease  PROCEDURE:   Informed consent was obtained prior to the procedure. The risks, benefits and alternatives for the procedure were discussed and the patient comprehended these risks.  Risks include, but are not limited to, cough, sore throat, vomiting, nausea, somnolence, esophageal and stomach trauma or perforation, bleeding, low blood pressure, aspiration, pneumonia, infection, trauma to the teeth and death.    After a procedural time-out, the oropharynx was anesthetized with 20% benzocaine spray.   During this procedure the patient was administered IV propofol by Anesthesiology (Dr. Roanna Banning).   The transesophageal probe was inserted in the esophagus and stomach without difficulty and multiple views were obtained.  The patient was kept under observation until the patient left the procedure room.  The patient left the procedure room in stable condition.   Agitated microbubble saline contrast was not administered.  COMPLICATIONS:    There were no immediate complications.  FINDINGS:  Moderate mitral stenosis and moderate mitral insufficiency due to rheumatic changes. There is severe thickening and calcification of the A2 portion of the anterior mitral leaflet and there is mild-moderate subvalvular apparatus thickening and retraction. The mitral regurgitant jet is eccentric, laterally directed. There is systolic dominant flow in the right pulmonary veins, blunted systolic forward flow on the left. LVEF is decreased, 40-45%, at least in part due to pacing induced dyssynchrony.  RECOMMENDATIONS:   At this point neither the MR, nor the MS appear to be severe. The valve is not amenable to valvuloplasty. LV dysfunction appears to be largely related to pacing-induced dyssynchrony. Medical therapy is probably appropriate at this time.    Time Spent Directly with the Patient:  45 minutes   Raysha Tilmon 03/01/2022, 9:07 AM

## 2022-03-01 NOTE — Discharge Instructions (Signed)

## 2022-03-01 NOTE — Anesthesia Preprocedure Evaluation (Addendum)
Anesthesia Evaluation  Patient identified by MRN, date of birth, ID band Patient awake    Reviewed: Allergy & Precautions, NPO status , Patient's Chart, lab work & pertinent test results  Airway Mallampati: II  TM Distance: >3 FB Neck ROM: Full    Dental  (+) Upper Dentures, Lower Dentures   Pulmonary asthma , sleep apnea    Pulmonary exam normal        Cardiovascular hypertension, Pt. on home beta blockers and Pt. on medications + Peripheral Vascular Disease and +CHF  Normal cardiovascular exam+ pacemaker      Neuro/Psych  Headaches, Seizures -,  PSYCHIATRIC DISORDERS Anxiety Depression    TIA   GI/Hepatic PUD,GERD  Medicated and Controlled,,(+) Cirrhosis     substance abuse  IBS (irritable bowel syndrome)   Endo/Other  diabetes    Renal/GU negative Renal ROS     Musculoskeletal  (+) Arthritis ,    Abdominal   Peds  Hematology negative hematology ROS (+)   Anesthesia Other Findings RHEUMATIC MITRAL VALVE DISEASE  Reproductive/Obstetrics                             Anesthesia Physical Anesthesia Plan  ASA: 3  Anesthesia Plan: MAC   Post-op Pain Management:    Induction: Intravenous  PONV Risk Score and Plan: 2 and Propofol infusion and Treatment may vary due to age or medical condition  Airway Management Planned: Nasal Cannula  Additional Equipment:   Intra-op Plan:   Post-operative Plan:   Informed Consent: I have reviewed the patients History and Physical, chart, labs and discussed the procedure including the risks, benefits and alternatives for the proposed anesthesia with the patient or authorized representative who has indicated his/her understanding and acceptance.     Dental advisory given  Plan Discussed with: CRNA  Anesthesia Plan Comments:        Anesthesia Quick Evaluation

## 2022-03-01 NOTE — Interval H&P Note (Signed)
History and Physical Interval Note:  03/01/2022 8:09 AM  Carol Richmond  has presented today for surgery, with the diagnosis of RHEUMATIC MITRAL VALVE DISEASE.  The various methods of treatment have been discussed with the patient and family. After consideration of risks, benefits and other options for treatment, the patient has consented to  Procedure(s): TRANSESOPHAGEAL ECHOCARDIOGRAM (TEE) (N/A) as a surgical intervention.  The patient's history has been reviewed, patient examined, no change in status, stable for surgery.  I have reviewed the patient's chart and labs.  Questions were answered to the patient's satisfaction.     Kabrea Seeney

## 2022-03-01 NOTE — Transfer of Care (Signed)
Immediate Anesthesia Transfer of Care Note  Patient: Carol Richmond  Procedure(s) Performed: TRANSESOPHAGEAL ECHOCARDIOGRAM (TEE)  Patient Location: PACU  Anesthesia Type:MAC  Level of Consciousness: awake, alert , and oriented  Airway & Oxygen Therapy: Patient Spontanous Breathing  Post-op Assessment: Report given to RN and Post -op Vital signs reviewed and stable  Post vital signs: Reviewed and stable  Last Vitals:  Vitals Value Taken Time  BP    Temp    Pulse 83 03/01/22 0913  Resp 12 03/01/22 0913  SpO2 98 % 03/01/22 0913  Vitals shown include unvalidated device data.  Last Pain:  Vitals:   03/01/22 0800  TempSrc: Temporal  PainSc: 0-No pain         Complications: No notable events documented.

## 2022-03-01 NOTE — Progress Notes (Signed)
  Echocardiogram Echocardiogram Transesophageal has been performed.  Carol Richmond 03/01/2022, 9:16 AM

## 2022-03-03 ENCOUNTER — Encounter (HOSPITAL_COMMUNITY): Payer: Self-pay | Admitting: Cardiovascular Disease

## 2022-03-03 NOTE — Anesthesia Postprocedure Evaluation (Signed)
Anesthesia Post Note  Patient: Carol Richmond  Procedure(s) Performed: TRANSESOPHAGEAL ECHOCARDIOGRAM (TEE)     Patient location during evaluation: PACU Anesthesia Type: MAC Level of consciousness: awake Pain management: pain level controlled Vital Signs Assessment: post-procedure vital signs reviewed and stable Respiratory status: spontaneous breathing, nonlabored ventilation and respiratory function stable Cardiovascular status: blood pressure returned to baseline and stable Postop Assessment: no apparent nausea or vomiting Anesthetic complications: no   No notable events documented.  Last Vitals:  Vitals:   03/01/22 0923 03/01/22 0933  BP: 96/69 111/63  Pulse: 81 74  Resp: (!) 22 18  Temp:    SpO2: 98% 100%    Last Pain:  Vitals:   03/01/22 0933  TempSrc:   PainSc: 0-No pain                 Starlette Thurow P Denaja Verhoeven

## 2022-03-06 ENCOUNTER — Ambulatory Visit (INDEPENDENT_AMBULATORY_CARE_PROVIDER_SITE_OTHER): Payer: Medicare HMO | Admitting: Family Medicine

## 2022-03-06 ENCOUNTER — Encounter: Payer: Self-pay | Admitting: Family Medicine

## 2022-03-06 VITALS — BP 98/65 | HR 98 | Temp 97.8°F | Ht 64.0 in | Wt 162.4 lb

## 2022-03-06 DIAGNOSIS — R8290 Unspecified abnormal findings in urine: Secondary | ICD-10-CM | POA: Diagnosis not present

## 2022-03-06 DIAGNOSIS — K140 Glossitis: Secondary | ICD-10-CM

## 2022-03-06 DIAGNOSIS — E1165 Type 2 diabetes mellitus with hyperglycemia: Secondary | ICD-10-CM

## 2022-03-06 DIAGNOSIS — I5022 Chronic systolic (congestive) heart failure: Secondary | ICD-10-CM

## 2022-03-06 DIAGNOSIS — I110 Hypertensive heart disease with heart failure: Secondary | ICD-10-CM | POA: Diagnosis not present

## 2022-03-06 DIAGNOSIS — Z7984 Long term (current) use of oral hypoglycemic drugs: Secondary | ICD-10-CM | POA: Diagnosis not present

## 2022-03-06 LAB — MICROSCOPIC EXAMINATION: Renal Epithel, UA: NONE SEEN /hpf

## 2022-03-06 LAB — URINALYSIS, ROUTINE W REFLEX MICROSCOPIC
Bilirubin, UA: NEGATIVE
Ketones, UA: NEGATIVE
Nitrite, UA: NEGATIVE
Protein,UA: NEGATIVE
Specific Gravity, UA: 1.01 (ref 1.005–1.030)
Urobilinogen, Ur: 0.2 mg/dL (ref 0.2–1.0)
pH, UA: 5.5 (ref 5.0–7.5)

## 2022-03-06 MED ORDER — NYSTATIN 100000 UNIT/ML MT SUSP: 5.0000 mL | Freq: Three times a day (TID) | OROMUCOSAL | 0 refills | Status: DC

## 2022-03-06 MED ORDER — METFORMIN HCL 500 MG PO TABS: 500.0000 mg | ORAL_TABLET | Freq: Two times a day (BID) | ORAL | 3 refills | Status: DC

## 2022-03-06 NOTE — Progress Notes (Signed)
Established Patient Office Visit  Subjective   [name], female    DOB: 08-Apr-1950  Age: 72 y.o. MRN: [id number]  Chief Complaint  Patient presents with   Medical Management of Chronic Issues    HPI Carol Richmond is here to discussed her medications for diabetes.  She is having to pay $45 a month for jardiance. She no longer wishes to take this. She is requesting to start metformin instead. She reports that she did well on this in the past without side effects. Her last A1c was 7.2. Her jardiance was increased to 25 mg at that point.   She continues to have tongue pain. It is a burning pain. She recently went to [facility] and was prescribed lidocaine mouth wash. She reports that this is not helping. She has tried magic mouthwash in the past and did feel like that was a little helpful. She has an appt with ENT net week.   She also reports a foul smell to her urine. Denies other urinary symptoms. No hx of recurrent UTIs.      ROS Negative unless specially indicated above in HPI.   Objective:     BP 98/65   Pulse 98   Temp 97.8 F (36.6 C) (Temporal)   Ht '5\' 4"'$  (1.626 m)   Wt 162 lb 6 oz (73.7 kg)   SpO2 95%   BMI 27.87 kg/m    Physical Exam Vitals and nursing note reviewed.  Constitutional:      General: She is not in acute distress.    Appearance: She is not ill-appearing, toxic-appearing or diaphoretic.  HENT:     Mouth/Throat:     Mouth: Mucous membranes are moist.     Pharynx: No oropharyngeal exudate.     Comments: Mild erythema to tongue, raised papilla.  Cardiovascular:     Rate and Rhythm: Normal rate and regular rhythm.     Heart sounds: Normal heart sounds. No murmur heard. Pulmonary:     Effort: Pulmonary effort is normal. No respiratory distress.     Breath sounds: Normal breath sounds.  Musculoskeletal:     Right lower leg: No edema.     Left lower leg: No edema.  Skin:    General: Skin is warm and dry.  Neurological:     General: No  focal deficit present.     Mental Status: She is alert and oriented to person, place, and time.  Psychiatric:        Mood and Affect: Mood normal.        Behavior: Behavior normal.      No results found for any visits on 03/06/22.    The 10-year ASCVD risk score (Arnett DK, et al., 2019) is: 13.5%    Assessment & Plan:   Carol Richmond was seen today for medical management of chronic issues.  Diagnoses and all orders for this visit:  Type 2 diabetes mellitus with hyperglycemia, without long-term current use of insulin (HCC) Chronic HFrEF (heart failure with reduced ejection fraction) ([place]) Discussed that jardiance is also indicated for HF. She declined referral for assistance. Will switch to metformin as below and d/c jardiance as requested.  -     metFORMIN (GLUCOPHAGE) 500 MG tablet; Take 1 tablet (500 mg total) by mouth 2 (two) times daily with a meal.  Glossitis Recurrent. Magic mouthwash as below. She has upcoming appt with ENT. Also recommended dental appt regarding fitting of dentures.  -     magic mouthwash (nystatin,  lidocaine, diphenhydrAMINE, alum & mag hydroxide) suspension; Swish and swallow 5 mLs 3 (three) times daily.  Abnormal urine odor Culture pending.  -     Urinalysis, Routine w reflex microscopic -     Urine Culture -     Microscopic Examination   Return if symptoms worsen or fail to improve.   The patient indicates understanding of these issues and agrees with the plan.  Gwenlyn Perking, FNP

## 2022-03-08 ENCOUNTER — Ambulatory Visit (INDEPENDENT_AMBULATORY_CARE_PROVIDER_SITE_OTHER): Payer: Medicare HMO | Admitting: *Deleted

## 2022-03-08 ENCOUNTER — Other Ambulatory Visit: Payer: Self-pay | Admitting: Family Medicine

## 2022-03-08 DIAGNOSIS — E1165 Type 2 diabetes mellitus with hyperglycemia: Secondary | ICD-10-CM

## 2022-03-08 DIAGNOSIS — N390 Urinary tract infection, site not specified: Secondary | ICD-10-CM

## 2022-03-08 DIAGNOSIS — I152 Hypertension secondary to endocrine disorders: Secondary | ICD-10-CM

## 2022-03-08 MED ORDER — CEPHALEXIN 500 MG PO CAPS: 500.0000 mg | ORAL_CAPSULE | Freq: Two times a day (BID) | ORAL | 0 refills | Status: DC

## 2022-03-08 NOTE — Patient Instructions (Addendum)
Please call the care guide team at [phone] if you need to cancel or reschedule your appointment.   If you are experiencing a Mental Health or [place] or need someone to talk to, please call the Suicide and Crisis Lifeline: 988 call the [place] National Suicide Prevention Lifeline: (867) 813-5884 or TTY: 332-482-1261 TTY 9490674524) to talk to a trained counselor call 1-800-273-TALK (toll free, 24 hour hotline) go to [facility] [address], [place] [phone]) call the [facility]: [phone] call 911   Following is a copy of the CCM Program Consent:  CCM service includes personalized support from designated clinical staff supervised by the physician, including individualized plan of care and coordination with other care providers 24/7 contact phone numbers for assistance for urgent and routine care needs. Service will only be billed when office clinical staff spend 20 minutes or more in a month to coordinate care. Only one practitioner may furnish and bill the service in a calendar month. The patient may stop CCM services at amy time (effective at the end of the month) by phone call to the office staff. The patient will be responsible for cost sharing (co-pay) or up to 20% of the service fee (after annual deductible is met)  Following is a copy of your full provider care plan:   Goals Addressed             This Visit's Progress    CCM (DIABETES) EXPECTED OUTCOME:  MONITOR, SELF-MANAGE AND REDUCE SYMPTOMS OF DIABETES       Current Barriers:  Knowledge Deficits related to Diabetes management Chronic Disease Management support and education needs related to Diabetes and diet Patient reports she checks CBG every other day with fasting readings ranges 130's. Patient states no longer on jardiance and now on metformin (to start Monday when she is finished with jardiance). Patient is exercising and would like  referral to dietician and has not heard anything, does not always follow a special diet.  Planned Interventions: Reviewed medications with patient and discussed importance of medication adherence;        Reviewed prescribed diet with patient carbohydrate modified diet; Counseled on importance of regular laboratory monitoring as prescribed;        Discussed plans with patient for ongoing care management follow up and provided patient with direct contact information for care management team;      Advised patient, providing education and rationale, to check cbg once daily and record        call provider for findings outside established parameters;       Review of patient status, including review of consultants reports, relevant laboratory and other test results, and medications completed;       In basket sent primary care provider office [facility]) inquiring if referral has been sent  Symptom Management: Take medications as prescribed   Attend all scheduled provider appointments Call pharmacy for medication refills 3-7 days in advance of running out of medications Attend church or other social activities Perform all self care activities independently  Perform IADL's (shopping, preparing meals, housekeeping, managing finances) independently Call provider office for new concerns or questions  check blood sugar at prescribed times: once daily check feet daily for cuts, sores or redness enter blood sugar readings and medication or insulin into daily log take the blood sugar log to all doctor visits take the blood sugar meter to all doctor visits trim toenails straight across drink 6 to 8 glasses of  water each day fill half of plate with vegetables limit fast food meals to no more than 1 per week manage portion size prepare main meal at home 3 to 5 days each week read food labels for fat, fiber, carbohydrates and portion size Be mindful of carbohydrate intake at each meal (too much  bread, pasta, rice, potatoes at one meal with elevate your blood sugar) Referral for dietician has been requested  Follow Up Plan: Telephone follow up appointment with care management team member scheduled for: 05/06/22 at 9 am       CCM (HYPERTENSION) EXPECTED OUTCOME: MONITOR, SELF-MANAGE AND REDUCE SYMPTOMS OF HYPERTENSION       Current Barriers:  Knowledge Deficits related to Hypertension management Chronic Disease Management support and education needs related to Hypertension, diet Patient reports she is widowed and lives alone, is independent with all aspects of her care, continues to drive, goes to the gym approximately 5 days per week, has adult daughter she can call on if needed, does not monitor blood pressure, is considering obtaining blood pressure cuff [facility] and may start checking BP.  Pt reports she does not always follow a special diet, would like referral to see dietician.  Planned Interventions: Evaluation of current treatment plan related to hypertension self management and patient's adherence to plan as established by provider;   Provided education to patient re: stroke prevention, s/s of heart attack and stroke; Reviewed prescribed diet low sodium diet Reviewed medications with patient and discussed importance of compliance;  Counseled on the importance of exercise goals with target of 150 minutes per week Discussed plans with patient for ongoing care management follow up and provided patient with direct contact information for care management team; Advised patient, providing education and rationale, to monitor blood pressure daily and record, calling PCP for findings outside established parameters;  Discussed complications of poorly controlled blood pressure such as heart disease, stroke, circulatory complications, vision complications, kidney impairment, sexual dysfunction;  Screening for signs and symptoms of depression related to chronic disease state;  Assessed  social determinant of health barriers;   Symptom Management: Take medications as prescribed   Attend all scheduled provider appointments Call pharmacy for medication refills 3-7 days in advance of running out of medications Attend church or other social activities Perform all self care activities independently  Perform IADL's (shopping, preparing meals, housekeeping, managing finances) independently Call provider office for new concerns or questions  check blood pressure weekly choose a place to take my blood pressure (home, clinic or office, retail store) write blood pressure results in a log or diary take blood pressure log to all doctor appointments take medications for blood pressure exactly as prescribed begin an exercise program report new symptoms to your doctor eat more whole grains, fruits and vegetables, lean meats and healthy fats Continue working out at the gym- keep up the good work! Follow low sodium diet  Follow Up Plan: Telephone follow up appointment with care management team member scheduled for:  05/06/22 at 9 am          Patient verbalizes understanding of instructions and care plan provided today and agrees to view in [place]. Active MyChart status and patient understanding of how to access instructions and care plan via MyChart confirmed with patient.     Telephone follow up appointment with care management team member scheduled for:  05/06/22 at 9 am  Carbohydrate Counting for Diabetes Mellitus, Adult Carbohydrate counting is a method of keeping track of how many carbohydrates  you eat. Eating carbohydrates increases the amount of sugar (glucose) in the blood. Counting how many carbohydrates you eat improves how well you manage your blood glucose. This, in turn, helps you manage your diabetes. Carbohydrates are measured in grams (g) per serving. It is important to know how many carbohydrates (in grams or by serving size) you can have in each meal. This is  different for every person. A dietitian can help you make a meal plan and calculate how many carbohydrates you should have at each meal and snack. What foods contain carbohydrates? Carbohydrates are found in the following foods: Grains, such as breads and cereals. Dried beans and soy products. Starchy vegetables, such as potatoes, peas, and corn. Fruit and fruit juices. Milk and yogurt. Sweets and snack foods, such as cake, cookies, candy, chips, and soft drinks. How do I count carbohydrates in foods? There are two ways to count carbohydrates in food. You can read food labels or learn standard serving sizes of foods. You can use either of these methods or a combination of both. Using the Nutrition Facts label The Nutrition Facts list is included on the labels of almost all packaged foods and beverages in the [place]. It includes: The serving size. Information about nutrients in each serving, including the grams of carbohydrate per serving. To use the Nutrition Facts, decide how many servings you will have. Then, multiply the number of servings by the number of carbohydrates per serving. The resulting number is the total grams of carbohydrates that you will be having. Learning the standard serving sizes of foods When you eat carbohydrate foods that are not packaged or do not include Nutrition Facts on the label, you need to measure the servings in order to count the grams of carbohydrates. Measure the foods that you will eat with a food scale or measuring cup, if needed. Decide how many standard-size servings you will eat. Multiply the number of servings by 15. For foods that contain carbohydrates, one serving equals 15 g of carbohydrates. For example, if you eat 2 cups or 10 oz (300 g) of strawberries, you will have eaten 2 servings and 30 g of carbohydrates (2 servings x 15 g = 30 g). For foods that have more than one food mixed, such as soups and casseroles, you must count the  carbohydrates in each food that is included. The following list contains standard serving sizes of common carbohydrate-rich foods. Each of these servings has about 15 g of carbohydrates: 1 slice of bread. 1 six-inch (15 cm) tortilla. ? cup or 2 oz (53 g) cooked rice or pasta.  cup or 3 oz (85 g) cooked or canned, drained and rinsed beans or lentils.  cup or 3 oz (85 g) starchy vegetable, such as peas, corn, or squash.  cup or 4 oz (120 g) hot cereal.  cup or 3 oz (85 g) boiled or mashed potatoes, or  or 3 oz (85 g) of a large baked potato.  cup or 4 fl oz (118 mL) fruit juice. 1 cup or 8 fl oz (237 mL) milk. 1 small or 4 oz (106 g) apple.  or 2 oz (63 g) of a medium banana. 1 cup or 5 oz (150 g) strawberries. 3 cups or 1 oz (28.3 g) popped popcorn. What is an example of carbohydrate counting? To calculate the grams of carbohydrates in this sample meal, follow the steps shown below. Sample meal 3 oz (85 g) chicken breast. ? cup or 4 oz (106 g)  brown rice.  cup or 3 oz (85 g) corn. 1 cup or 8 fl oz (237 mL) milk. 1 cup or 5 oz (150 g) strawberries with sugar-free whipped topping. Carbohydrate calculation Identify the foods that contain carbohydrates: Rice. Corn. Milk. Strawberries. Calculate how many servings you have of each food: 2 servings rice. 1 serving corn. 1 serving milk. 1 serving strawberries. Multiply each number of servings by 15 g: 2 servings rice x 15 g = 30 g. 1 serving corn x 15 g = 15 g. 1 serving milk x 15 g = 15 g. 1 serving strawberries x 15 g = 15 g. Add together all of the amounts to find the total grams of carbohydrates eaten: 30 g + 15 g + 15 g + 15 g = 75 g of carbohydrates total. What are tips for following this plan? Shopping Develop a meal plan and then make a shopping list. Buy fresh and frozen vegetables, fresh and frozen fruit, dairy, eggs, beans, lentils, and whole grains. Look at food labels. Choose foods that have more fiber and  less sugar. Avoid processed foods and foods with added sugars. Meal planning Aim to have the same number of grams of carbohydrates at each meal and for each snack time. Plan to have regular, balanced meals and snacks. Where to find more information American Diabetes Association: diabetes.org Centers for Disease Control and Prevention: StoreMirror.com.[place] Academy of Nutrition and Dietetics: eatright.org Association of Diabetes Care & Education Specialists: diabeteseducator.org Summary Carbohydrate counting is a method of keeping track of how many carbohydrates you eat. Eating carbohydrates increases the amount of sugar (glucose) in your blood. Counting how many carbohydrates you eat improves how well you manage your blood glucose. This helps you manage your diabetes. A dietitian can help you make a meal plan and calculate how many carbohydrates you should have at each meal and snack. This information is not intended to replace advice given to you by your health care provider. Make sure you discuss any questions you have with your health care provider. Document Revised: 09/08/2019 Document Reviewed: 09/08/2019 Elsevier Patient Education  [place].

## 2022-03-08 NOTE — Chronic Care Management (AMB) (Signed)
Chronic Care Management   CCM RN Visit Note  03/08/2022 Name: Carol Richmond MRN: [id number] DOB: 05-13-1950  Subjective: Carol Richmond is a 72 y.o. year old female who is a primary care patient of Gwenlyn Perking, FNP. The patient was referred to the Chronic Care Management team for assistance with care management needs subsequent to provider initiation of CCM services and plan of care.    Today's Visit:  Engaged with patient by telephone for follow up visit.        Goals Addressed             This Visit's Progress    CCM (DIABETES) EXPECTED OUTCOME:  MONITOR, SELF-MANAGE AND REDUCE SYMPTOMS OF DIABETES       Current Barriers:  Knowledge Deficits related to Diabetes management Chronic Disease Management support and education needs related to Diabetes and diet Patient reports she checks CBG every other day with fasting readings ranges 130's. Patient states no longer on jardiance and now on metformin (to start Monday when she is finished with jardiance). Patient is exercising and would like referral to dietician and has not heard anything, does not always follow a special diet.  Planned Interventions: Reviewed medications with patient and discussed importance of medication adherence;        Reviewed prescribed diet with patient carbohydrate modified diet; Counseled on importance of regular laboratory monitoring as prescribed;        Discussed plans with patient for ongoing care management follow up and provided patient with direct contact information for care management team;      Advised patient, providing education and rationale, to check cbg once daily and record        call provider for findings outside established parameters;       Review of patient status, including review of consultants reports, relevant laboratory and other test results, and medications completed;       In basket sent primary care provider office [facility]) inquiring if referral has been  sent  Symptom Management: Take medications as prescribed   Attend all scheduled provider appointments Call pharmacy for medication refills 3-7 days in advance of running out of medications Attend church or other social activities Perform all self care activities independently  Perform IADL's (shopping, preparing meals, housekeeping, managing finances) independently Call provider office for new concerns or questions  check blood sugar at prescribed times: once daily check feet daily for cuts, sores or redness enter blood sugar readings and medication or insulin into daily log take the blood sugar log to all doctor visits take the blood sugar meter to all doctor visits trim toenails straight across drink 6 to 8 glasses of water each day fill half of plate with vegetables limit fast food meals to no more than 1 per week manage portion size prepare main meal at home 3 to 5 days each week read food labels for fat, fiber, carbohydrates and portion size Be mindful of carbohydrate intake at each meal (too much bread, pasta, rice, potatoes at one meal with elevate your blood sugar) Referral for dietician has been requested  Follow Up Plan: Telephone follow up appointment with care management team member scheduled for: 05/06/22 at 9 am       CCM (HYPERTENSION) EXPECTED OUTCOME: MONITOR, SELF-MANAGE AND REDUCE SYMPTOMS OF HYPERTENSION       Current Barriers:  Knowledge Deficits related to Hypertension management Chronic Disease Management support and education needs related to Hypertension, diet Patient reports she is widowed and  lives alone, is independent with all aspects of her care, continues to drive, goes to the gym approximately 5 days per week, has adult daughter she can call on if needed, does not monitor blood pressure, is considering obtaining blood pressure cuff from [facility] and may start checking BP.  Pt reports she does not always follow a special diet, would like referral to see  dietician.  Planned Interventions: Evaluation of current treatment plan related to hypertension self management and patient's adherence to plan as established by provider;   Provided education to patient re: stroke prevention, s/s of heart attack and stroke; Reviewed prescribed diet low sodium diet Reviewed medications with patient and discussed importance of compliance;  Counseled on the importance of exercise goals with target of 150 minutes per week Discussed plans with patient for ongoing care management follow up and provided patient with direct contact information for care management team; Advised patient, providing education and rationale, to monitor blood pressure daily and record, calling PCP for findings outside established parameters;  Discussed complications of poorly controlled blood pressure such as heart disease, stroke, circulatory complications, vision complications, kidney impairment, sexual dysfunction;  Screening for signs and symptoms of depression related to chronic disease state;  Assessed social determinant of health barriers;   Symptom Management: Take medications as prescribed   Attend all scheduled provider appointments Call pharmacy for medication refills 3-7 days in advance of running out of medications Attend church or other social activities Perform all self care activities independently  Perform IADL's (shopping, preparing meals, housekeeping, managing finances) independently Call provider office for new concerns or questions  check blood pressure weekly choose a place to take my blood pressure (home, clinic or office, retail store) write blood pressure results in a log or diary take blood pressure log to all doctor appointments take medications for blood pressure exactly as prescribed begin an exercise program report new symptoms to your doctor eat more whole grains, fruits and vegetables, lean meats and healthy fats Continue working out at the gym- keep  up the good work! Follow low sodium diet  Follow Up Plan: Telephone follow up appointment with care management team member scheduled for:  05/06/22 at 9 am          Plan:Telephone follow up appointment with care management team member scheduled for:  05/06/22 at 9 am  Jacqlyn Larsen [facility], BSN RN Case Manager [place] [phone]

## 2022-03-09 LAB — URINE CULTURE

## 2022-03-11 ENCOUNTER — Telehealth: Payer: Self-pay | Admitting: Family Medicine

## 2022-03-11 ENCOUNTER — Other Ambulatory Visit: Payer: Self-pay | Admitting: Family Medicine

## 2022-03-11 DIAGNOSIS — N390 Urinary tract infection, site not specified: Secondary | ICD-10-CM

## 2022-03-11 DIAGNOSIS — H95122 Granulation of postmastoidectomy cavity, left ear: Secondary | ICD-10-CM | POA: Diagnosis not present

## 2022-03-11 MED ORDER — CIPROFLOXACIN HCL 500 MG PO TABS: 500.0000 mg | ORAL_TABLET | Freq: Two times a day (BID) | ORAL | 0 refills | Status: AC

## 2022-03-11 NOTE — Telephone Encounter (Signed)
Patient aware

## 2022-03-11 NOTE — Telephone Encounter (Signed)
Cipro is fine for her take and will not affect her liver.

## 2022-03-12 ENCOUNTER — Other Ambulatory Visit: Payer: Self-pay | Admitting: Family Medicine

## 2022-03-12 ENCOUNTER — Encounter (INDEPENDENT_AMBULATORY_CARE_PROVIDER_SITE_OTHER): Payer: Self-pay | Admitting: Gastroenterology

## 2022-03-12 DIAGNOSIS — R110 Nausea: Secondary | ICD-10-CM

## 2022-03-18 ENCOUNTER — Ambulatory Visit: Payer: Medicare HMO

## 2022-03-18 DIAGNOSIS — I471 Supraventricular tachycardia, unspecified: Secondary | ICD-10-CM | POA: Diagnosis not present

## 2022-03-18 DIAGNOSIS — Z8679 Personal history of other diseases of the circulatory system: Secondary | ICD-10-CM

## 2022-03-19 ENCOUNTER — Encounter: Payer: Medicare HMO | Admitting: Internal Medicine

## 2022-03-19 LAB — CUP PACEART REMOTE DEVICE CHECK
Battery Remaining Longevity: 180 mo
Battery Voltage: 3.21 V
Brady Statistic AP VP Percent: 0.01 %
Brady Statistic AP VS Percent: 3.99 %
Brady Statistic AS VP Percent: 0.03 %
Brady Statistic AS VS Percent: 95.98 %
Brady Statistic RA Percent Paced: 4 %
Brady Statistic RV Percent Paced: 0.04 %
Date Time Interrogation Session: 20240129052358
Implantable Lead Connection Status: 753985
Implantable Lead Connection Status: 753985
Implantable Lead Implant Date: 20231027
Implantable Lead Implant Date: 20231027
Implantable Lead Location: 753859
Implantable Lead Location: 753860
Implantable Lead Model: 3830
Implantable Lead Model: 5076
Implantable Pulse Generator Implant Date: 20231027
Lead Channel Impedance Value: 304 Ohm
Lead Channel Impedance Value: 399 Ohm
Lead Channel Impedance Value: 475 Ohm
Lead Channel Impedance Value: 608 Ohm
Lead Channel Pacing Threshold Amplitude: 0.625 V
Lead Channel Pacing Threshold Amplitude: 0.625 V
Lead Channel Pacing Threshold Pulse Width: 0.4 ms
Lead Channel Pacing Threshold Pulse Width: 0.4 ms
Lead Channel Sensing Intrinsic Amplitude: 19.625 mV
Lead Channel Sensing Intrinsic Amplitude: 19.625 mV
Lead Channel Sensing Intrinsic Amplitude: 3.75 mV
Lead Channel Sensing Intrinsic Amplitude: 3.75 mV
Lead Channel Setting Pacing Amplitude: 1.5 V
Lead Channel Setting Pacing Amplitude: 2 V
Lead Channel Setting Pacing Pulse Width: 0.4 ms
Lead Channel Setting Sensing Sensitivity: 1.2 mV
Zone Setting Status: 755011

## 2022-03-20 DIAGNOSIS — E1159 Type 2 diabetes mellitus with other circulatory complications: Secondary | ICD-10-CM

## 2022-03-20 DIAGNOSIS — I10 Essential (primary) hypertension: Secondary | ICD-10-CM

## 2022-03-21 ENCOUNTER — Ambulatory Visit: Payer: Medicare HMO | Attending: Internal Medicine | Admitting: Internal Medicine

## 2022-03-21 ENCOUNTER — Encounter: Payer: Self-pay | Admitting: Internal Medicine

## 2022-03-21 ENCOUNTER — Encounter: Payer: Self-pay | Admitting: *Deleted

## 2022-03-21 ENCOUNTER — Telehealth: Payer: Self-pay | Admitting: Internal Medicine

## 2022-03-21 VITALS — BP 110/74 | Ht 64.0 in | Wt 163.0 lb

## 2022-03-21 DIAGNOSIS — I471 Supraventricular tachycardia, unspecified: Secondary | ICD-10-CM

## 2022-03-21 DIAGNOSIS — I459 Conduction disorder, unspecified: Secondary | ICD-10-CM | POA: Diagnosis not present

## 2022-03-21 DIAGNOSIS — I5022 Chronic systolic (congestive) heart failure: Secondary | ICD-10-CM | POA: Diagnosis not present

## 2022-03-21 LAB — CUP PACEART INCLINIC DEVICE CHECK
Battery Remaining Longevity: 179 mo
Battery Voltage: 3.21 V
Brady Statistic AP VP Percent: 0.01 %
Brady Statistic AP VS Percent: 3.93 %
Brady Statistic AS VP Percent: 0.03 %
Brady Statistic AS VS Percent: 96.03 %
Brady Statistic RA Percent Paced: 3.94 %
Brady Statistic RV Percent Paced: 0.04 %
Date Time Interrogation Session: 20240201092426
Implantable Lead Connection Status: 753985
Implantable Lead Connection Status: 753985
Implantable Lead Implant Date: 20231027
Implantable Lead Implant Date: 20231027
Implantable Lead Location: 753859
Implantable Lead Location: 753860
Implantable Lead Model: 3830
Implantable Lead Model: 5076
Implantable Pulse Generator Implant Date: 20231027
Lead Channel Impedance Value: 342 Ohm
Lead Channel Impedance Value: 418 Ohm
Lead Channel Impedance Value: 494 Ohm
Lead Channel Impedance Value: 646 Ohm
Lead Channel Pacing Threshold Amplitude: 0.625 V
Lead Channel Pacing Threshold Amplitude: 0.625 V
Lead Channel Pacing Threshold Pulse Width: 0.4 ms
Lead Channel Pacing Threshold Pulse Width: 0.4 ms
Lead Channel Sensing Intrinsic Amplitude: 20.5 mV
Lead Channel Sensing Intrinsic Amplitude: 23 mV
Lead Channel Sensing Intrinsic Amplitude: 4 mV
Lead Channel Sensing Intrinsic Amplitude: 4.375 mV
Lead Channel Setting Pacing Amplitude: 2 V
Lead Channel Setting Pacing Amplitude: 2.5 V
Lead Channel Setting Pacing Pulse Width: 0.4 ms
Lead Channel Setting Sensing Sensitivity: 1.2 mV
Zone Setting Status: 755011

## 2022-03-21 NOTE — Telephone Encounter (Signed)
Pt states she was just at office and is requesting return call to discuss the vitamin that she was recommended by Dr. Lovena Le. She ask that this recommendation be left on vm is she does not answer.

## 2022-03-21 NOTE — Telephone Encounter (Signed)
On AVS it states take multivitamin,left message as she requested.

## 2022-03-21 NOTE — Progress Notes (Signed)
HPI Mrs. Nourse returns for ongoing evaluation of SVT s/p ablation complicated by CHB. She is a pleasant 72 yo woman with a h/o fatty liver disease, HTN, and obesity who has developed a wide QRS tachycardia. She has baseline LBBB and her echo demonstrates that she has had a reduction in her EF from 55% to 35-40%. She has class 2 symptoms. She has not had syncope and she has only mild dyspnea.  During her ablation, she had CHB and underwent PPM insertion. In the interim she notes she feels well. She notes however that her tongue is burning.  No Known Allergies   Current Outpatient Medications  Medication Sig Dispense Refill   Accu-Chek Softclix Lancets lancets check blood sugars twice daily DX E11.65 200 each 3   acetaminophen (TYLENOL) 500 MG tablet Take 500 mg by mouth every 6 (six) hours as needed for mild pain.     aspirin EC 81 MG tablet Take 1 tablet (81 mg total) by mouth daily.     benzonatate (TESSALON) 200 MG capsule Take 1 capsule (200 mg total) by mouth 3 (three) times daily as needed. 30 capsule 3   carvedilol (COREG) 3.125 MG tablet TAKE (1) TABLET TWICE DAILY. 60 tablet 6   cetirizine (ZYRTEC) 10 MG tablet Take 1 tablet (10 mg total) by mouth daily. 30 tablet 11   chlorhexidine (PERIDEX) 0.12 % solution Use as directed 5 mLs in the mouth or throat 2 (two) times daily.     cholecalciferol (VITAMIN D) 1000 UNITS tablet Take 1,000 Units by mouth daily.     fluticasone (FLONASE) 50 MCG/ACT nasal spray Place 2 sprays into both nostrils daily as needed for allergies or rhinitis.     furosemide (LASIX) 40 MG tablet Take 1 tablet (40 mg total) by mouth daily. 90 tablet 3   gabapentin (NEURONTIN) 300 MG capsule Take 300 mg by mouth 3 (three) times daily as needed (Nerve in throat).     glucose blood (ACCU-CHEK GUIDE) test strip Use as instructed to test blood sugar daily as directed. DX:E11.9 50 each 12   lactulose (CHRONULAC) 10 GM/15ML solution TAKE 30 MLS (20 GRAM TOTAL) BY  MOUTH 3 TIMES A DAY. 2700 mL 0   lamoTRIgine (LAMICTAL) 200 MG tablet TAKE (1) TABLET TWICE DAILY. 180 tablet 3   lidocaine (XYLOCAINE) 2 % solution Use as directed 10 mLs in the mouth or throat every 6 (six) hours as needed for mouth pain. 100 mL 0   LORazepam (ATIVAN) 0.5 MG tablet Take 1 tablet (0.5 mg total) by mouth 2 (two) times daily as needed for anxiety. 30 tablet 5   magic mouthwash (nystatin, lidocaine, diphenhydrAMINE, alum & mag hydroxide) suspension Swish and swallow 5 mLs 3 (three) times daily. 180 mL 0   metFORMIN (GLUCOPHAGE) 500 MG tablet Take 1 tablet (500 mg total) by mouth 2 (two) times daily with a meal. 180 tablet 3   naloxone (NARCAN) nasal spray 4 mg/0.1 mL Place 1 spray into the nose once. Take by nasal route every 3 minutes until patient awakes or EMS arrives.     nystatin (MYCOSTATIN) 100000 UNIT/ML suspension Take by mouth.     ondansetron (ZOFRAN) 4 MG tablet TAKE 1 TABLET BY MOUTH EVERY 8 HOURS AS NEEDED FOR NAUSEA AND VOMITING. 20 tablet 0   pantoprazole (PROTONIX) 40 MG tablet TAKE (1) TABLET TWICE DAILY. 60 tablet 5   potassium chloride SA (KLOR-CON M) 20 MEQ tablet Take 2 tablets (40 mEq total)  by mouth daily. 90 tablet 3   sacubitril-valsartan (ENTRESTO) 24-26 MG Take 1 tablet by mouth 2 (two) times daily. 180 tablet 3   traMADol (ULTRAM) 50 MG tablet Take 50 mg by mouth daily as needed for severe pain or moderate pain.     traZODone (DESYREL) 50 MG tablet Take 0.5-1 tablets (25-50 mg total) by mouth at bedtime as needed for sleep. 30 tablet 3   No current facility-administered medications for this visit.     Past Medical History:  Diagnosis Date   Asthma    AVNRT (AV nodal re-entry tachycardia)    Carotid artery disease (HCC)    Cirrhosis ([place])    Diverticulosis of colon (without mention of hemorrhage)    Essential hypertension    Functional ovarian cysts    GERD (gastroesophageal reflux disease)    Hemorrhoids, external, without mention of  complication    Hormone replacement therapy (postmenopausal)    IBS (irritable bowel syndrome)    Migraine    Mitral valve disease    Pacemaker    Complete heart block - Medtronic   Right knee DJD    Seizures (HCC)    Sleep apnea    TIA (transient ischemic attack)    Type 2 diabetes mellitus ([place])     ROS:   All systems reviewed and negative except as noted in the HPI.   Past Surgical History:  Procedure Laterality Date   BIOPSY  09/23/2018   Procedure: BIOPSY;  Surgeon: Rogene Houston, MD;  Location: [facility];  Service: Endoscopy;;  duodeneum and stomach   CHOLECYSTECTOMY  1980   ENDARTERECTOMY Right 07/01/2017   Procedure: ENDARTERECTOMY CAROTID RIGHT;  Surgeon: Serafina Mitchell, MD;  Location: [facility] OR;  Service: Vascular;  Laterality: Right;   ESOPHAGOGASTRODUODENOSCOPY N/A 09/23/2018   Procedure: ESOPHAGOGASTRODUODENOSCOPY (EGD);  Surgeon: Rogene Houston, MD;  Location: [facility];  Service: Endoscopy;  Laterality: N/A;  2:45-moved to 1:45 per Lelon Frohlich   ESOPHAGOGASTRODUODENOSCOPY (EGD) WITH PROPOFOL N/A 03/07/2021   Procedure: ESOPHAGOGASTRODUODENOSCOPY (EGD) WITH PROPOFOL;  Surgeon: Rogene Houston, MD;  Location: [facility];  Service: Endoscopy;  Laterality: N/A;  12:50   LEFT HEART CATHETERIZATION WITH CORONARY ANGIOGRAM N/A 11/18/2013   Procedure: LEFT HEART CATHETERIZATION WITH CORONARY ANGIOGRAM;  Surgeon: Burnell Blanks, MD;  Location: [facility] CATH LAB;  Service: Cardiovascular;  Laterality: N/A;   OOPHORECTOMY     2012 Dr. Adah Perl in [place] N/A 12/14/2021   Procedure: PACEMAKER IMPLANT;  Surgeon: Evans Lance, MD;  Location: [place] CV LAB;  Service: Cardiovascular;  Laterality: N/A;   PATCH ANGIOPLASTY Right 07/01/2017   Procedure: RIGHT CAROTID PATCH ANGIOPLASTY;  Surgeon: Serafina Mitchell, MD;  Location: [facility] OR;  Service: Vascular;  Laterality: Right;   SVT ABLATION N/A 12/14/2021   Procedure: SVT ABLATION;  Surgeon: Evans Lance,  MD;  Location: [place] CV LAB;  Service: Cardiovascular;  Laterality: N/A;   TEE WITHOUT CARDIOVERSION N/A 03/01/2022   Procedure: TRANSESOPHAGEAL ECHOCARDIOGRAM (TEE);  Surgeon: Sanda Klein, MD;  Location: [place];  Service: Cardiovascular;  Laterality: N/A;   TONSILLECTOMY     TOTAL KNEE ARTHROPLASTY Right 09/02/2012   Procedure: TOTAL KNEE ARTHROPLASTY- right;  Surgeon: Ninetta Lights, MD;  Location: [place];  Service: Orthopedics;  Laterality: Right;   TUBAL LIGATION     TYMPANOMASTOIDECTOMY Left 04/02/2016   Procedure: LEFT CANAL WALL DOWN TYMPANOMASTOIDECTOMY;  Surgeon: Leta Baptist, MD;  Location: [place];  Service: ENT;  Laterality: Left;   VAGINAL HYSTERECTOMY  1990   WOUND EXPLORATION Right 07/02/2017   Procedure: RIGHT NECK EXPLORATION WITH INTRAOPERATIVE ULTRASOUND;  Surgeon: Angelia Mould, MD;  Location: [facility] OR;  Service: Vascular;  Laterality: Right;     Family History  Problem Relation Age of Onset   Heart disease Mother    Diabetes Mother    Heart disease Brother    Cancer Sister    Heart disease Brother    Heart disease Brother    Heart disease Son      Social History   Socioeconomic History   Marital status: Widowed    Spouse name: Not on file   Number of children: 3   Years of education: Not on file   Highest education level: Not on file  Occupational History   Occupation: retired  Tobacco Use   Smoking status: Never   Smokeless tobacco: Never  Vaping Use   Vaping Use: Never used  Substance and Sexual Activity   Alcohol use: No    Alcohol/week: 0.0 standard drinks of alcohol   Drug use: No   Sexual activity: Not Currently  Other Topics Concern   Not on file  Social History Narrative   Lives alone. Granddaughter lives across the street, her 3 children all live nearby   Social Determinants of Health   Financial Resource Strain: Low Risk  (01/04/2022)   Overall Financial Resource Strain (CARDIA)    Difficulty of  Paying Living Expenses: Not very hard  Food Insecurity: No Food Insecurity (01/04/2022)   Hunger Vital Sign    Worried About Running Out of Food in the Last Year: Never true    Ran Out of Food in the Last Year: Never true  Transportation Needs: No Transportation Needs (01/04/2022)   PRAPARE - Hydrologist (Medical): No    Lack of Transportation (Non-Medical): No  Physical Activity: Sufficiently Active (01/04/2022)   Exercise Vital Sign    Days of Exercise per Week: 5 days    Minutes of Exercise per Session: 30 min  Stress: No Stress Concern Present (01/04/2022)   [place]    Feeling of Stress : Not at all  Social Connections: Moderately Integrated (01/04/2022)   Social Connection and Isolation Panel [NHANES]    Frequency of Communication with Friends and Family: More than three times a week    Frequency of Social Gatherings with Friends and Family: More than three times a week    Attends Religious Services: More than 4 times per year    Active Member of [employer] or Organizations: Yes    Attends Archivist Meetings: More than 4 times per year    Marital Status: Widowed  Intimate Partner Violence: Not At Risk (07/05/2021)   Humiliation, Afraid, Rape, and Kick questionnaire    Fear of Current or Ex-Partner: No    Emotionally Abused: No    Physically Abused: No    Sexually Abused: No     BP 110/74   Ht '5\' 4"'$  (1.626 m)   Wt 163 lb (73.9 kg)   SpO2 94%   BMI 27.98 kg/m   Physical Exam:  Well appearing NAD HEENT: Unremarkable Neck:  No JVD, no thyromegally Lymphatics:  No adenopathy Back:  No CVA tenderness Lungs:  Clear with no wheezes HEART:  Regular rate rhythm, no murmurs, no rubs, no clicks Abd:  soft, positive bowel sounds, no organomegally, no rebound,  no guarding Ext:  2 plus pulses, no edema, no cyanosis, no clubbing Skin:  No rashes no nodules Neuro:  CN II  through XII intact, motor grossly intact  EKG -   DEVICE  Normal device function.  See PaceArt for details.   Assess/Plan: SVT - she is s/p EP study and catheter ablation and has had no SVT CHB - she is conducting with LBBB and has not had any significant ventricular pacing PPM - her medtronic DDD PM is working normally.  Burning tongue - she could have a nutritional deficiency. I asked her to take a multivitamin daily  Salome Spotted

## 2022-03-21 NOTE — Patient Instructions (Addendum)
Medication Instructions:  Your physician recommends that you continue on your current medications as directed. Please refer to the Current Medication list given to you today.  Start a Multivitamin   Labwork: NONE   Testing/Procedures: NONE   Follow-Up: Your physician wants you to follow-up in: 1 Year. You will receive a reminder letter in the mail two months in advance. If you don't receive a letter, please call our office to schedule the follow-up appointment.   Any Other Special Instructions Will Be Listed Below (If Applicable).  Exercise is unlimited     If you need a refill on your cardiac medications before your next appointment, please call your pharmacy.

## 2022-03-22 ENCOUNTER — Ambulatory Visit (INDEPENDENT_AMBULATORY_CARE_PROVIDER_SITE_OTHER): Payer: Medicare HMO | Admitting: Family Medicine

## 2022-03-22 ENCOUNTER — Encounter: Payer: Self-pay | Admitting: Family Medicine

## 2022-03-22 VITALS — BP 108/64 | HR 87 | Temp 97.9°F | Ht 64.0 in | Wt 162.1 lb

## 2022-03-22 DIAGNOSIS — K140 Glossitis: Secondary | ICD-10-CM | POA: Diagnosis not present

## 2022-03-22 DIAGNOSIS — K219 Gastro-esophageal reflux disease without esophagitis: Secondary | ICD-10-CM | POA: Diagnosis not present

## 2022-03-22 MED ORDER — NYSTATIN 100000 UNIT/ML MT SUSP: 5.0000 mL | Freq: Three times a day (TID) | OROMUCOSAL | 0 refills | Status: DC

## 2022-03-22 MED ORDER — FAMOTIDINE 20 MG PO TABS: 20.0000 mg | ORAL_TABLET | Freq: Two times a day (BID) | ORAL | 0 refills | Status: DC

## 2022-03-22 NOTE — Progress Notes (Unsigned)
Acute Office Visit  Subjective:     [name], female    DOB: 1950/04/27, 72 y.o.   MRN: [id number]  Chief Complaint  Patient presents with   throat problem    HPI Patient is in today for sore throat. She reports that this started a few days ago and this on the right side. Denies cough, congestion, fever, trouble swallowing. She reports it is an ache. Hx of GERD and chronic rhinitis. She has not been taking zyrtec. She is taking protonix.   She also reports continued tongue pain. It is a burning pain. She has tried numerous mouths washes. She reports that magic mouthwash has been helpful.      03/22/2022    2:00 PM 03/06/2022   10:51 AM 02/25/2022   10:47 AM  Depression screen PHQ 2/9  Decreased Interest 0 0 0  Down, Depressed, Hopeless 0 0 0  PHQ - 2 Score 0 0 0  Altered sleeping 0 0 0  Tired, decreased energy 0 0 0  Change in appetite 0 0 0  Feeling bad or failure about yourself  0 0 0  Trouble concentrating 0 0 0  Moving slowly or fidgety/restless 0 0 0  Suicidal thoughts 0 0 0  PHQ-9 Score 0 0 0  Difficult doing work/chores Not difficult at all Not difficult at all Not difficult at all      03/22/2022    2:00 PM 03/06/2022   10:51 AM 02/25/2022   10:48 AM 02/20/2022    1:16 PM  GAD 7 : Generalized Anxiety Score  Nervous, Anxious, on Edge 3 0 0 0  Control/stop worrying 0 0 0 0  Worry too much - different things 0 0 0 0  Trouble relaxing 0 0 0 0  Restless 0 0 0 0  Easily annoyed or irritable 0 0 0 0  Afraid - awful might happen 0 0 0 0  Total GAD 7 Score 3 0 0 0  Anxiety Difficulty Not difficult at all Not difficult at all Not difficult at all Not difficult at all     ROS As per HPI.      Objective:    BP 108/64   Pulse 87   Temp 97.9 F (36.6 C) (Temporal)   Ht '5\' 4"'$  (1.626 m)   Wt 162 lb 2 oz (73.5 kg)   SpO2 94%   BMI 27.83 kg/m    Physical Exam Vitals and nursing note reviewed.  Constitutional:      General: She is not in  acute distress.    Appearance: She is not ill-appearing, toxic-appearing or diaphoretic.  HENT:     Right Ear: Tympanic membrane, ear canal and external ear normal.     Left Ear: Tympanic membrane, ear canal and external ear normal.     Nose: Nose normal.     Mouth/Throat:     Mouth: Mucous membranes are moist. No injury, lacerations, oral lesions or angioedema.     Tongue: No lesions.     Palate: No mass and lesions.     Pharynx: Oropharynx is clear. No pharyngeal swelling, oropharyngeal exudate, posterior oropharyngeal erythema or uvula swelling.     Tonsils: No tonsillar exudate or tonsillar abscesses. 0 on the right. 0 on the left.     Comments: Mild erythema to tongue.  Cardiovascular:     Rate and Rhythm: Normal rate and regular rhythm.     Heart sounds: Normal heart sounds. No murmur heard. Pulmonary:  Effort: Pulmonary effort is normal. No respiratory distress.     Breath sounds: Normal breath sounds. No wheezing or rhonchi.  Musculoskeletal:     Cervical back: Neck supple. No rigidity or tenderness.  Lymphadenopathy:     Cervical: No cervical adenopathy.  Skin:    General: Skin is warm and dry.  Neurological:     General: No focal deficit present.     Mental Status: She is alert and oriented to person, place, and time.  Psychiatric:        Mood and Affect: Mood normal.        Behavior: Behavior normal.     No results found for any visits on 03/22/22.      Assessment & Plan:   [name] was seen today for throat problem.  Diagnoses and all orders for this visit:  Sore throat  Gastroesophageal reflux disease, unspecified whether esophagitis present -     famotidine (PEPCID) 20 MG tablet; Take 1 tablet (20 mg total) by mouth 2 (two) times daily.  Glossitis -     magic mouthwash (nystatin, lidocaine, diphenhydrAMINE, alum & mag hydroxide) suspension; Swish and swallow 5 mLs 3 (three) times daily.  Benign exam. Discussed restarting zyrtec. If no improvement,  start pepid with protonix for GERD. Discussed possibly that burning tongue pain is due to GERD. She will start a multivitamin.    Return to office for new or worsening symptoms, or if symptoms persist.   The patient indicates understanding of these issues and agrees with the plan.  Gwenlyn Perking, FNP

## 2022-03-25 ENCOUNTER — Telehealth (INDEPENDENT_AMBULATORY_CARE_PROVIDER_SITE_OTHER): Payer: Self-pay

## 2022-03-25 DIAGNOSIS — K7682 Hepatic encephalopathy: Secondary | ICD-10-CM

## 2022-03-25 MED ORDER — LACTULOSE 10 GM/15ML PO SOLN: ORAL | 0 refills | Status: DC

## 2022-03-25 NOTE — Telephone Encounter (Signed)
Lactulose refilled.

## 2022-03-27 ENCOUNTER — Other Ambulatory Visit: Payer: Self-pay | Admitting: Family Medicine

## 2022-03-27 ENCOUNTER — Other Ambulatory Visit: Payer: Self-pay

## 2022-03-27 NOTE — Telephone Encounter (Signed)
Patient calling back. Said she has been taking 2 a day per the doctor at the hospital. Please  call back.

## 2022-03-27 NOTE — Telephone Encounter (Signed)
Called pt to verify how she is taking the potassium as in there med list it says take 2 tabs daily.

## 2022-03-28 ENCOUNTER — Encounter (INDEPENDENT_AMBULATORY_CARE_PROVIDER_SITE_OTHER): Payer: Self-pay | Admitting: Gastroenterology

## 2022-03-28 ENCOUNTER — Ambulatory Visit (INDEPENDENT_AMBULATORY_CARE_PROVIDER_SITE_OTHER): Payer: Medicare HMO | Admitting: Gastroenterology

## 2022-03-28 VITALS — BP 117/76 | HR 116 | Temp 95.5°F | Ht 64.0 in | Wt 163.3 lb

## 2022-03-28 DIAGNOSIS — K7460 Unspecified cirrhosis of liver: Secondary | ICD-10-CM | POA: Diagnosis not present

## 2022-03-28 DIAGNOSIS — K219 Gastro-esophageal reflux disease without esophagitis: Secondary | ICD-10-CM

## 2022-03-28 DIAGNOSIS — K146 Glossodynia: Secondary | ICD-10-CM | POA: Diagnosis not present

## 2022-03-28 NOTE — Patient Instructions (Addendum)
-   Neurology referral - evaluation for neuropathy affecting tongue - Consider following up with ENT if tongue symptoms persist - Can take magi mouthwash TID as needed, can call for further refills - Continue pantoprazole 40 mg BID - patient should take medication in the 30-45 minutes before eating breakfast and supper.  - Stop famotidine - Repeat colonoscopy in 05/2022 and EGD 02/2023 - Check CBC, MELD labs and AFP - Schedule liver US - Reduce salt intake to <2 g per day - Can take Tylenol max of 2 g per day (650 mg q8h) for pain - Avoid NSAIDs for pain - Avoid eating raw oysters/shellfish - Protein shake (Ensure or Boost) every night before going to sleep

## 2022-03-28 NOTE — Progress Notes (Signed)
Maylon Peppers, M.D. Gastroenterology & Hepatology [facility] [address] [place], [place] [address]  Primary Care Physician: Gwenlyn Perking, [place] [address]  I will communicate my assessment and recommendations to the referring MD via EMR.  Problems: NASH cirrhosis Grade 1 EV Burning mouth syndrome  History of Present Illness: Carol Richmond is a 72 y.o. female with PMH NASH cirrhosis with G1 EV, GERD, SVT, who presents for follow up of   Cirrhosis and evaluation of burning sensation in her mouth.  The patient was last seen on 10/01/21.No changes in her management were made at that time.  Patient states she has been having persistent burning in her mouth, especially in her tongue - this started 4 months ago. She states that she has felt as if her "tongue is on fire". Has tried magic mouthwash which helps for a short period of time. She was recently prescribed famotidine 20 mg BID by her PCP, but she has not noticed any difference.  Patient is currently taking pantoprazole 40 mg twice a day. She denies having heartburn or odynophagia/dysphagia. She also presents with frequent hoarseness for the last 5 years. She tried "allergy pills but it didn't help".  Has seen ENT at [facility] (Dr. Lincoln Maxin) but she reports that she needs to see another ENT doctor as he will miss his children.  Notably, she was referred for evaluation at [facility] by her PCP but she has not been seen yet.  The patient denies having any nausea, vomiting, fever, chills, hematochezia, melena, hematemesis, abdominal distention, abdominal pain, diarrhea, jaundice, pruritus or weight loss.  Cirrhosis related questions: Hematemesis/coffee ground emesis: No Abdominal pain: No Abdominal distention/worsening ascitesNo Fever/chills: No Episodes of confusion/disorientation: No Taking diuretics?: yes, furosemide 40 mg qday History of  variceal bleeding: No Prior history of banding?: No Prior episodes of SBP: No Last time liver imaging was performed:10/18/21-  [place] no masses ion liver Last AFP: 10/18/21  - 4.4 MELD score: 10/18/21  - 8   Last Colonoscopy: 05/2017  normal colonoscopy, recommend repeat in 5 years Last Endoscopy:- 03/07/21 Normal hypopharynx. - Normal proximal esophagus and mid esophagus. - Grade I esophageal varices. - Z-line irregular, 37 cm from the incisors. - Portal hypertensive gastropathy. - Food was found in the stomach. - Erosive gastropathy with no stigmata of recent bleeding(secondary to NSAID; gastric biopsy negative for H. pylori in August 2020) - Normal duodenal bulb and second portion of the duodenum. - No specimens collected.  Past Medical History: Past Medical History:  Diagnosis Date   Asthma    AVNRT (AV nodal re-entry tachycardia)    Carotid artery disease (HCC)    Cirrhosis ([place])    Diverticulosis of colon (without mention of hemorrhage)    Essential hypertension    Functional ovarian cysts    GERD (gastroesophageal reflux disease)    Hemorrhoids, external, without mention of complication    Hormone replacement therapy (postmenopausal)    IBS (irritable bowel syndrome)    Migraine    Mitral valve disease    Pacemaker    Complete heart block - [employer]   Right knee DJD    Seizures (HCC)    Sleep apnea    TIA (transient ischemic attack)    Type 2 diabetes mellitus ([place])     Past Surgical History: Past Surgical History:  Procedure Laterality Date   BIOPSY  09/23/2018   Procedure: BIOPSY;  Surgeon: Rogene Houston, MD;  [facility];  Service: Endoscopy;;  duodeneum and stomach   CHOLECYSTECTOMY  1980   ENDARTERECTOMY Right 07/01/2017   Procedure: ENDARTERECTOMY CAROTID RIGHT;  Surgeon: Serafina Mitchell, MD;  Location: [facility] OR;  Service: Vascular;  Laterality: Right;   ESOPHAGOGASTRODUODENOSCOPY N/A 09/23/2018   Procedure: ESOPHAGOGASTRODUODENOSCOPY (EGD);   Surgeon: Rogene Houston, MD;  [facility];  Service: Endoscopy;  Laterality: N/A;  2:45-moved to 1:45 per Lelon Frohlich   ESOPHAGOGASTRODUODENOSCOPY (EGD) WITH PROPOFOL N/A 03/07/2021   Procedure: ESOPHAGOGASTRODUODENOSCOPY (EGD) WITH PROPOFOL;  Surgeon: Rogene Houston, MD;  [facility];  Service: Endoscopy;  Laterality: N/A;  12:50   LEFT HEART CATHETERIZATION WITH CORONARY ANGIOGRAM N/A 11/18/2013   Procedure: LEFT HEART CATHETERIZATION WITH CORONARY ANGIOGRAM;  Surgeon: Burnell Blanks, MD;  Location: [facility] CATH LAB;  Service: Cardiovascular;  Laterality: N/A;   OOPHORECTOMY     2012 Dr. Adah Perl in [place] N/A 12/14/2021   Procedure: PACEMAKER IMPLANT;  Surgeon: Evans Lance, MD;  Location: [facility] CV LAB;  Service: Cardiovascular;  Laterality: N/A;   PATCH ANGIOPLASTY Right 07/01/2017   Procedure: RIGHT CAROTID PATCH ANGIOPLASTY;  Surgeon: Serafina Mitchell, MD;  Location: [facility] OR;  Service: Vascular;  Laterality: Right;   SVT ABLATION N/A 12/14/2021   Procedure: SVT ABLATION;  Surgeon: Evans Lance, MD;  Location: [facility] CV LAB;  Service: Cardiovascular;  Laterality: N/A;   TEE WITHOUT CARDIOVERSION N/A 03/01/2022   Procedure: TRANSESOPHAGEAL ECHOCARDIOGRAM (TEE);  Surgeon: Sanda Klein, MD;  Location: [facility];  Service: Cardiovascular;  Laterality: N/A;   TONSILLECTOMY     TOTAL KNEE ARTHROPLASTY Right 09/02/2012   Procedure: TOTAL KNEE ARTHROPLASTY- right;  Surgeon: Ninetta Lights, MD;  Location: [facility];  Service: Orthopedics;  Laterality: Right;   TUBAL LIGATION     TYMPANOMASTOIDECTOMY Left 04/02/2016   Procedure: LEFT CANAL WALL DOWN TYMPANOMASTOIDECTOMY;  Surgeon: Leta Baptist, MD;  Location: [facility];  Service: ENT;  Laterality: Left;   VAGINAL HYSTERECTOMY  1990   WOUND EXPLORATION Right 07/02/2017   Procedure: RIGHT NECK EXPLORATION WITH INTRAOPERATIVE ULTRASOUND;  Surgeon: Angelia Mould, MD;  Location[facility]  OR;  Service: Vascular;  Laterality: Right;    Family History: Family History  Problem Relation Age of Onset   Heart disease Mother    Diabetes Mother    Heart disease Brother    Cancer Sister    Heart disease Brother    Heart disease Brother    Heart disease Son     Social History: Social History   Tobacco Use  Smoking Status Never  Smokeless Tobacco Never   Social History   Substance and Sexual Activity  Alcohol Use No   Alcohol/week: 0.0 standard drinks of alcohol   Social History   Substance and Sexual Activity  Drug Use No    Allergies: No Known Allergies  Medications: Current Outpatient Medications  Medication Sig Dispense Refill   Accu-Chek Softclix Lancets lancets check blood sugars twice daily DX E11.65 200 each 3   acetaminophen (TYLENOL) 500 MG tablet Take 500 mg by mouth every 6 (six) hours as needed for mild pain.     aspirin EC 81 MG tablet Take 1 tablet (81 mg total) by mouth daily.     carvedilol (COREG) 3.125 MG tablet TAKE (1) TABLET TWICE DAILY. 60 tablet 6   chlorhexidine (PERIDEX) 0.12 % solution Use as directed 5 mLs in the mouth or throat 2 (two) times  daily.     famotidine (PEPCID) 20 MG tablet Take 1 tablet (20 mg total) by mouth 2 (two) times daily. 120 tablet 0   fluticasone (FLONASE) 50 MCG/ACT nasal spray Place 2 sprays into both nostrils daily as needed for allergies or rhinitis.     furosemide (LASIX) 40 MG tablet Take 1 tablet (40 mg total) by mouth daily. 90 tablet 3   gabapentin (NEURONTIN) 300 MG capsule Take 300 mg by mouth 3 (three) times daily as needed (Nerve in throat).     glucose blood (ACCU-CHEK GUIDE) test strip Use as instructed to test blood sugar daily as directed. DX:E11.9 50 each 12   lactulose (CHRONULAC) 10 GM/15ML solution TAKE 30 MLS (20 GRAM TOTAL) BY MOUTH 3 TIMES A DAY. 2700 mL 0   lamoTRIgine (LAMICTAL) 200 MG tablet TAKE (1) TABLET TWICE DAILY. 180 tablet 3   lidocaine (XYLOCAINE) 2 % solution Use as  directed 10 mLs in the mouth or throat every 6 (six) hours as needed for mouth pain. 100 mL 0   LORazepam (ATIVAN) 0.5 MG tablet Take 1 tablet (0.5 mg total) by mouth 2 (two) times daily as needed for anxiety. 30 tablet 5   magic mouthwash (nystatin, lidocaine, diphenhydrAMINE, alum & mag hydroxide) suspension Swish and swallow 5 mLs 3 (three) times daily. 320 mL 0   metFORMIN (GLUCOPHAGE) 500 MG tablet Take 1 tablet (500 mg total) by mouth 2 (two) times daily with a meal. 180 tablet 3   naloxone (NARCAN) nasal spray 4 mg/0.1 mL Place 1 spray into the nose once. Take by nasal route every 3 minutes until patient awakes or EMS arrives.     nystatin (MYCOSTATIN) 100000 UNIT/ML suspension Take by mouth.     ondansetron (ZOFRAN) 4 MG tablet TAKE 1 TABLET BY MOUTH EVERY 8 HOURS AS NEEDED FOR NAUSEA AND VOMITING. 20 tablet 0   pantoprazole (PROTONIX) 40 MG tablet TAKE (1) TABLET TWICE DAILY. 60 tablet 5   potassium chloride SA (KLOR-CON M) 20 MEQ tablet Take 2 tablets (40 mEq total) by mouth daily. 90 tablet 1   sacubitril-valsartan (ENTRESTO) 24-26 MG Take 1 tablet by mouth 2 (two) times daily. 180 tablet 3   traMADol (ULTRAM) 50 MG tablet Take 50 mg by mouth daily as needed for severe pain or moderate pain.     traZODone (DESYREL) 50 MG tablet Take 0.5-1 tablets (25-50 mg total) by mouth at bedtime as needed for sleep. 30 tablet 3   No current facility-administered medications for this visit.    Review of Systems: GENERAL: negative for malaise, night sweats HEENT: No changes in hearing or vision, no nose bleeds or other nasal problems. NECK: Negative for lumps, goiter, pain and significant neck swelling RESPIRATORY: Negative for cough, wheezing CARDIOVASCULAR: Negative for chest pain, leg swelling, palpitations, orthopnea GI: SEE HPI MUSCULOSKELETAL: Negative for joint pain or swelling, back pain, and muscle pain. SKIN: Negative for lesions, rash PSYCH: Negative for sleep disturbance, mood  disorder and recent psychosocial stressors. HEMATOLOGY Negative for prolonged bleeding, bruising easily, and swollen nodes. ENDOCRINE: Negative for cold or heat intolerance, polyuria, polydipsia and goiter. NEURO: negative for tremor, gait imbalance, syncope and seizures. The remainder of the review of systems is noncontributory.   Physical Exam: BP 117/76 (BP Location: Left Arm, Patient Position: Sitting, Cuff Size: Normal)   Pulse (!) 116   Temp (!) 95.5 F (35.3 C) (Temporal)   Ht '5\' 4"'$  (1.626 m)   Wt 163 lb 4.8 oz (74.1 kg)  BMI 28.03 kg/m  GENERAL: The patient is AO x3, in no acute distress. HEENT: Head is normocephalic and atraumatic. EOMI are intact. Mouth is well hydrated and without lesions. Tongue looks healthy, no erythema, only geographical tongue. NECK: Supple. No masses LUNGS: Clear to auscultation. No presence of rhonchi/wheezing/rales. Adequate chest expansion HEART: RRR, normal s1 and s2. ABDOMEN: Soft, nontender, no guarding, no peritoneal signs, and nondistended. BS +. No masses. EXTREMITIES: Without any cyanosis, clubbing, rash, lesions or edema. NEUROLOGIC: AOx3, no focal motor deficit. SKIN: no jaundice, no rashes  Imaging/Labs: as above  I personally reviewed and interpreted the available labs, imaging and endoscopic files.  Impression and Plan: Carol Richmond is a 72 y.o. female with PMH NASH cirrhosis with G1 EV, GERD, SVT, who presents for follow up of   Cirrhosis and evaluation of burning sensation in her mouth.  Regarding her cirrhosis, the patient has not presented any decompensating event and had a low MELD score in her last blood test.  She is  a CPT class A with mild evidence of portal hypertension given the presence of esophageal varices on last EGD.  She does not drink any alcohol.  Patient will require a repeat EGD 2 years from her last endoscopy.  Will update her [place] screening with ultrasound and AFP.  Will also recheck her MELD labs  today.  In terms of her burning mouth symptoms, I consider this is related to burning tongue syndrome.  She has not presented any ongoing reflux episodes while taking her PPI twice a day.  I recommended on taking the medication at least 30 minutes before her meals.  We discussed that if will be possible to rule out GERD as a cause for her ongoing burning mouth with a pH impedance testing for 24 hours while on PPI but she is not interested in pursuing this at this moment.  She will let me know if she changes her mind which I find reasonable.  For now, we will refer her to neurology for further evaluation of possible glossopharyngeal neuropathy.  She will also follow-up with her ENT for other causes of abdominal pain.  She can continue with Magic mouthwash as needed for now.  There is little role for famotidine and I asked the patient to stop this medication now.  - Neurology referral - evaluation for neuropathy affecting tongue - Consider following up with ENT if tongue symptoms persist - Can take magi mouthwash TID as needed, can call for further refills - Continue pantoprazole 40 mg BID - patient should take medication in the 30-45 minutes before eating breakfast and supper.  - Stop famotidine - Repeat colonoscopy in 05/2022 and EGD 02/2023 - Check CBC, MELD labs and AFP - Schedule liver US - Reduce salt intake to <2 g per day - Can take Tylenol max of 2 g per day (650 mg q8h) for pain - Avoid NSAIDs for pain - Avoid eating raw oysters/shellfish - Protein shake (Ensure or Boost) every night before going to sleep  All questions were answered.      Maylon Peppers, MD Gastroenterology and Hepatology [facility]

## 2022-03-29 DIAGNOSIS — K7460 Unspecified cirrhosis of liver: Secondary | ICD-10-CM | POA: Diagnosis not present

## 2022-04-01 ENCOUNTER — Telehealth (INDEPENDENT_AMBULATORY_CARE_PROVIDER_SITE_OTHER): Payer: Self-pay

## 2022-04-01 ENCOUNTER — Other Ambulatory Visit: Payer: Self-pay | Admitting: Family Medicine

## 2022-04-01 ENCOUNTER — Telehealth (INDEPENDENT_AMBULATORY_CARE_PROVIDER_SITE_OTHER): Payer: Self-pay | Admitting: *Deleted

## 2022-04-01 DIAGNOSIS — R110 Nausea: Secondary | ICD-10-CM

## 2022-04-01 LAB — COMPREHENSIVE METABOLIC PANEL
AG Ratio: 1.8 (calc) (ref 1.0–2.5)
ALT: 14 U/L (ref 6–29)
AST: 23 U/L (ref 10–35)
Albumin: 4.8 g/dL (ref 3.6–5.1)
Alkaline phosphatase (APISO): 78 U/L (ref 37–153)
BUN: 12 mg/dL (ref 7–25)
CO2: 24 mmol/L (ref 20–32)
Calcium: 9.9 mg/dL (ref 8.6–10.4)
Chloride: 102 mmol/L (ref 98–110)
Creat: 0.86 mg/dL (ref 0.50–1.05)
Globulin: 2.7 g/dL (calc) (ref 1.9–3.7)
Glucose, Bld: 141 mg/dL — ABNORMAL HIGH (ref 65–99)
Potassium: 4.4 mmol/L (ref 3.5–5.3)
Sodium: 140 mmol/L (ref 135–146)
Total Bilirubin: 0.6 mg/dL (ref 0.2–1.2)
Total Protein: 7.5 g/dL (ref 6.1–8.1)

## 2022-04-01 LAB — CBC WITH DIFFERENTIAL/PLATELET
Absolute Monocytes: 992 cells/uL — ABNORMAL HIGH (ref 200–950)
Basophils Absolute: 33 cells/uL (ref 0–200)
Basophils Relative: 0.4 %
Eosinophils Absolute: 57 cells/uL (ref 15–500)
Eosinophils Relative: 0.7 %
HCT: 42.7 % (ref 35.0–45.0)
Hemoglobin: 14.8 g/dL (ref 11.7–15.5)
Lymphs Abs: 1878 cells/uL (ref 850–3900)
MCH: 30.6 pg (ref 27.0–33.0)
MCHC: 34.7 g/dL (ref 32.0–36.0)
MCV: 88.4 fL (ref 80.0–100.0)
MPV: 10.2 fL (ref 7.5–12.5)
Monocytes Relative: 12.1 %
Neutro Abs: 5240 cells/uL (ref 1500–7800)
Neutrophils Relative %: 63.9 %
Platelets: 215 10*3/uL (ref 140–400)
RBC: 4.83 10*6/uL (ref 3.80–5.10)
RDW: 13 % (ref 11.0–15.0)
Total Lymphocyte: 22.9 %
WBC: 8.2 10*3/uL (ref 3.8–10.8)

## 2022-04-01 LAB — PROTIME-INR
INR: 1
Prothrombin Time: 10.8 s (ref 9.0–11.5)

## 2022-04-01 LAB — AFP TUMOR MARKER: AFP-Tumor Marker: 3.7 ng/mL

## 2022-04-01 NOTE — Telephone Encounter (Deleted)
Patient called for lab results. I let her know one test was still pending and probably be another day or two. She also wanted to cancel referral to neurology for evalution for neuropathy affecting tongue. She reports she just wants to continue the magic mouthwash three times a day for now.   [phone]

## 2022-04-01 NOTE — Telephone Encounter (Signed)
Patient called for results of labs. I let her know one lab was still pending and probably be another day or two. She also wants to cancel referral to neurologist for neuropathy affecting tongue. She states she just wants to continue the magic mouthwash for now.   [phone]

## 2022-04-01 NOTE — Telephone Encounter (Signed)
Thanks for the update. I called the patient to the listed phone number but she did not respond to my phone call. Please let her know that her labs look stable, MELD score was low at 7 and her AFP tumor marker was normal.  Will repeat his labs in 6 months.

## 2022-04-02 ENCOUNTER — Ambulatory Visit (HOSPITAL_COMMUNITY): Payer: Medicare HMO

## 2022-04-02 NOTE — Telephone Encounter (Signed)
Carol Richmond, pt wanted to cancel referral. I let patient know I did not know if it had been sent already or not but if not I would let you know to cancel but if its been sent then she just needs to tell them she does not want appt when they call her. She verbalized understanding.

## 2022-04-02 NOTE — Telephone Encounter (Signed)
noted 

## 2022-04-02 NOTE — Telephone Encounter (Signed)
Discussed with patient per Dr. Jenetta Downer -   her labs look stable, MELD score was low at 7 and her AFP tumor marker was normal.  Will repeat his labs in 6 months. Patient verbalized understanding. Patient added to reminder file for labs.

## 2022-04-04 ENCOUNTER — Ambulatory Visit (INDEPENDENT_AMBULATORY_CARE_PROVIDER_SITE_OTHER): Payer: Medicare HMO | Admitting: Gastroenterology

## 2022-04-05 ENCOUNTER — Ambulatory Visit (INDEPENDENT_AMBULATORY_CARE_PROVIDER_SITE_OTHER): Payer: Medicare HMO | Admitting: Family Medicine

## 2022-04-05 ENCOUNTER — Ambulatory Visit (HOSPITAL_COMMUNITY)
Admission: RE | Admit: 2022-04-05 | Discharge: 2022-04-05 | Disposition: A | Discharge status: Home / Self Care | Payer: Medicare HMO | Source: Ambulatory Visit | Attending: Gastroenterology | Admitting: Gastroenterology

## 2022-04-05 ENCOUNTER — Encounter: Payer: Self-pay | Admitting: Family Medicine

## 2022-04-05 VITALS — BP 110/66 | HR 81 | Temp 97.6°F | Ht 64.0 in | Wt 159.4 lb

## 2022-04-05 DIAGNOSIS — F411 Generalized anxiety disorder: Secondary | ICD-10-CM

## 2022-04-05 DIAGNOSIS — K1379 Other lesions of oral mucosa: Secondary | ICD-10-CM | POA: Diagnosis not present

## 2022-04-05 DIAGNOSIS — K7460 Unspecified cirrhosis of liver: Secondary | ICD-10-CM | POA: Insufficient documentation

## 2022-04-05 DIAGNOSIS — R070 Pain in throat: Secondary | ICD-10-CM

## 2022-04-05 DIAGNOSIS — G8929 Other chronic pain: Secondary | ICD-10-CM | POA: Diagnosis not present

## 2022-04-05 DIAGNOSIS — K146 Glossodynia: Secondary | ICD-10-CM

## 2022-04-05 MED ORDER — CITALOPRAM HYDROBROMIDE 10 MG PO TABS: 10.0000 mg | ORAL_TABLET | Freq: Every day | ORAL | 3 refills | Status: DC

## 2022-04-05 NOTE — Patient Instructions (Addendum)
[place] [id number] [place], [place] [phone]   Overview What is burning mouth syndrome? Burning mouth syndrome (BMS) is a burning sensation on your tongue, roof of your mouth or lips. It can happen anywhere in your mouth or throat. BMS often starts seemingly out of nowhere. It may feel like your tongue is being burned by a hot liquid like coffee.  People with BMS often report that the burning worsens throughout the day. Your mouth may feel OK when you wake up but develop a burning feeling later in the day. Once asleep, the pain may improve. The next day the cycle begins again.  A bitter or metallic taste often happens along with the burning feeling. Many people also feel a dry mouth despite having regular saliva flow. Sometimes, the burning is so severe that the chronic pain causes depression and anxiety.  Are there different types of burning mouth syndrome? There are two categories of burning mouth syndrome:  Primary BMS is when burning mouth isn't caused by an underlying condition. Secondary BMS is caused by an underlying condition, such as acid reflux. Treating the condition often cures burning mouth syndrome. Is burning mouth syndrome more common in certain people? Burning mouth is most common in people in postmenopause who are over 67 years old. This is because reduced estrogen levels cause decreased taste bud sensitivity.  Another factor that makes women and people assigned female at birth more likely to have BMS is their ability to taste. People have genetic differences in their tasting ability. You may be a:  Nontaster. Medium taster. Super taster, who experiences flavors more intensely compared with the other types. Women are more likely than men to be super tasters. Most women with BMS are super tasters who've since lost some of their taste sensation. Studies show that many of those people also clench their teeth. The pressure on the teeth  may increase the burning feeling.  Other people also develop BMS -- in those cases, they usually have another condition called geographic tongue. In this mild condition, red patches appear on the tongue's surface.  Symptoms and Causes What are the symptoms of burning mouth? Burning mouth syndrome symptoms include:  Pain in your mouth that feels like tingling, scalding or burning. Numbness in your mouth that comes and goes. Altered taste. Dry mouth. What is the main cause of primary burning mouth syndrome? Researchers believe the cause of primary BMS is nerve damage affecting the area of your tongue that controls taste and pain. There's a relationship between burning mouth and taste (gustatory) changes.  Many people with burning mouth have lost their bitter taste buds, which are located at the tongue's tip. Researchers believe that taste inhibits pain, but when people lose the ability to taste bitter flavors, pain fibers start to fire unexpectedly. People experience this pain as a burning sensation in their mouth.  What are the causes of secondary burning mouth syndrome? Medical conditions that can cause secondary BMS include:  Acid reflux. Allergies to metal dental products or certain foods. Depression. Dry mouth. Hormonal changes. Mouth infections. Nutritional deficiencies. Teeth grinding or jaw clenching. Occasionally, people with Sjgren's syndrome (which causes dry mouth and dry eyes), diabetes, thyroid disease and liver problems have burning mouth syndrome.  Are there certain medications that cause burning mouth syndrome? Yes. Medications linked to BMS include ACE inhibitors for hypertension, some antidepressants and high blood pressure medications, such as:  Captopril. Clonazepam. Efavirenz. Enalapril. Fluoxetine. Hormonal replacement therapies.  Sertraline. What vitamin deficiency causes burning mouth syndrome? Sometimes, a deficiency of vitamin B12, folate or iron can  mimic the sensation of burning mouth.  Is burning mouth syndrome contagious? No. Since the cause of primary BMS is nerve damage, you can't spread it to someone else.  Diagnosis and Tests How is burning mouth syndrome diagnosed? BMS is challenging to diagnose. Part of diagnosing BMS is ruling out other conditions that can cause similar symptoms, such as an oral yeast infection (thrush).  If you have symptoms, see your dentist first. Oral health problems cause one-third of all BMS cases. If needed, your dentist may refer you to a specialist.  To confirm a diagnosis, your healthcare provider may perform:  Allergy tests. Blood tests. Imaging tests. Oral swab tests. Salivary flow test. Tissue biopsy. Management and Treatment How can I relieve burning mouth syndrome? You may find that sucking on ice chips or chewing gum helps with the discomfort. Topical or systemic clonazepam, a prescription medication, may also relieve the pain.  How is burning mouth treated? For burning mouth syndrome treatment, some medications can help. The U.S. Food and Drug Administration (FDA) hasn't approved these drugs specifically for BMS, but your healthcare provider may prescribe them to help the symptoms. These medications include:  Some antidepressants. Antiseizure medication. Gabapentin (a medicine used for seizures and herpes pain). Your healthcare provider can help figure out which medications that may be most effective for you. If oral problems (like teeth grinding or jaw clenching) cause BMS, your dentist can help correct the issue. If an underlying condition causes BMS, treating that condition should help cure your burning mouth symptoms. You may need to switch medications to find the best one for you.  Prevention How can I prevent burning mouth syndrome? There may not be a way to prevent BMS. But you can lessen the symptoms by avoiding anything that irritates your mouth,  including:  Alcohol. High-acidic foods or drinks (like citrus juices). Hot and spicy foods or drinks. Mouthwash containing alcohol. Tobacco products. Also, make sure your diet contains enough vitamin B12, folate and iron.  Outlook / Prognosis What is the outlook for burning mouth syndrome? For some people, BMS goes away on its own after a few years. But that can be a long time to live with mouth pain. Seeking treatment from your healthcare provider can resolve the issue faster.  Living With How long does it take for burning mouth syndrome to go away? Without treatment, burning mouth syndrome can last for months or even years. About one-third of those with burning mouth syndrome will improve over three to five years without any treatment.  BMS treatment can provide relief within days or weeks. Talk to your healthcare provider about your specific treatment and when you can expect to feel better.  A note from [place] mouth syndrome (BMS) is an uncomfortable condition that creates chronic pain. While it's sometimes difficult to diagnose, seeking care from your healthcare provider is the best way to cure the pain. Talk to your provider about your symptoms so you can find relief.

## 2022-04-05 NOTE — Progress Notes (Signed)
Acute Office Visit  Subjective:     [name], female    DOB: [birth year], 72 y.o.   MRN: [id number]  Chief Complaint  Patient presents with   Oral Pain    Oral Pain    Patient is in today for mouth pain. She reports continued burning pain of her tongue and throat. She has not followed up with ENT. She has follow up with GI. GI recommended a referral to neurology for neuropathy and BMS. Kennice reports that she does not wish to see another specialist. 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 Benjamine Mola for this. She feels like this is not helping. She has also asked for a second opinion for ENT but has not received a call regarding this appointment. She has been taking a multivitamin and reports good oral hygiene.   She also reports increased anxiety. She continues to take ativan prn without good relief. She would like to another medication to help with her anxiety.       04/05/2022    1:21 PM 03/22/2022    2:00 PM 03/06/2022   10:51 AM  Depression screen PHQ 2/9  Decreased Interest 0 0 0  Down, Depressed, Hopeless 0 0 0  PHQ - 2 Score 0 0 0  Altered sleeping 0 0 0  Tired, decreased energy 0 0 0  Change in appetite 0 0 0  Feeling bad or failure about yourself  0 0 0  Trouble concentrating 0 0 0  Moving slowly or fidgety/restless 0 0 0  Suicidal thoughts 0 0 0  PHQ-9 Score 0 0 0  Difficult doing work/chores Not difficult at all Not difficult at all Not difficult at all      04/05/2022    1:21 PM 03/22/2022    2:00 PM 03/06/2022   10:51 AM 02/25/2022   10:48 AM  GAD 7 : Generalized Anxiety Score  Nervous, Anxious, on Edge 1 3 0 0  Control/stop worrying 0 0 0 0  Worry too much - different things 0 0 0 0  Trouble relaxing 0 0 0 0  Restless 0 0 0 0  Easily annoyed or irritable 0 0 0 0  Afraid - awful might happen 0 0 0 0  Total GAD 7 Score 1 3 0 0  Anxiety Difficulty Not difficult at all Not difficult  at all Not difficult at all Not difficult at all     ROS As per HPI.      Objective:    BP 110/66   Pulse 81   Temp 97.6 F (36.4 C) (Temporal)   Ht 5' 4"$  (1.626 m)   Wt 159 lb 6 oz (72.3 kg)   SpO2 96%   BMI 27.36 kg/m    Physical Exam Vitals and nursing note reviewed.  Constitutional:      General: She is not in acute distress.    Appearance: She is not ill-appearing, toxic-appearing or diaphoretic.  HENT:     Mouth/Throat:     Lips: Pink.     Mouth: Mucous membranes are moist.     Dentition: Has dentures. No gingival swelling or gum lesions.     Tongue: No lesions.     Palate: No mass and lesions.     Pharynx: Oropharynx is clear.     Tonsils: No tonsillar exudate or tonsillar abscesses.  Neck:     Thyroid: No thyroid mass, thyromegaly or thyroid  tenderness.  Cardiovascular:     Rate and Rhythm: Normal rate and regular rhythm.     Heart sounds: Normal heart sounds. No murmur heard. Pulmonary:     Effort: Pulmonary effort is normal. No respiratory distress.  Musculoskeletal:     Cervical back: Neck supple. No edema.  Lymphadenopathy:     Cervical: No cervical adenopathy.  Skin:    General: Skin is warm and dry.  Neurological:     Mental Status: She is alert.  Psychiatric:        Attention and Perception: Attention normal.        Mood and Affect: Affect is tearful.        Speech: Speech normal.        Behavior: Behavior normal.        Thought Content: Thought content normal.     No results found for any visits on 04/05/22.      Assessment & Plan:   Ellora was seen today for oral pain.  Diagnoses and all orders for this visit:  Oral pain Chronic throat pain Burning tongue syndrome Uncontrolled. Contact information given for ENT for her to contact to schedule appt. Discussed oral hygiene. Continue multivamin. She is currently on gabapentin. Discussed to avoid mouth washes with alcohol as this is likely exacerbating symptoms. Keep mouth moist.    Generalized anxiety disorder Reports uncontrolled, though GAD screening is a 1. Will restart celexa at a low dose. Continue ativan prn.  -     citalopram (CELEXA) 10 MG tablet; Take 1 tablet (10 mg total) by mouth daily.   Follow up 4-6 weeks.   The patient indicates understanding of these issues and agrees with the plan.  Gwenlyn Perking, FNP

## 2022-04-08 ENCOUNTER — Other Ambulatory Visit (INDEPENDENT_AMBULATORY_CARE_PROVIDER_SITE_OTHER): Payer: Self-pay | Admitting: Gastroenterology

## 2022-04-08 DIAGNOSIS — K146 Glossodynia: Secondary | ICD-10-CM

## 2022-04-08 MED ORDER — LIDOCAINE VISCOUS HCL 2 % MT SOLN: 5.0000 mL | Freq: Four times a day (QID) | OROMUCOSAL | 3 refills | Status: DC | PRN

## 2022-04-09 NOTE — Telephone Encounter (Signed)
Patient using  magic mouthwash four times daily and reports it has been helping til today and today she is having pain throat pain. She has appt with ENT 3/6 and she called them today and asked to be put on cancellation list. She wants to know if there is anything else she can use for  now til she gets in with ENT.

## 2022-04-09 NOTE — Telephone Encounter (Signed)
Discussed with patient per Dr. Jenetta Downer -  She can use cepacol OTC every 4 hours. I would advise her again to have an evaluation by neurology - she declined this referral in her last appointment   Patient verbalized understanding.

## 2022-04-09 NOTE — Telephone Encounter (Signed)
She can use cepacol OTC every 4 hours. I would advise her again to have an evaluation by neurology - she declined this referral in her last appointment

## 2022-04-10 ENCOUNTER — Telehealth: Payer: Self-pay | Admitting: *Deleted

## 2022-04-10 NOTE — Telephone Encounter (Signed)
She still wanted to hold off on referral. Told her to call back if she changes her mind

## 2022-04-10 NOTE — Telephone Encounter (Signed)
Patient called reports she was in tears last night with tongue and throat burning and hurting. She reports she almost went to ED. States cepacol did not help. Mouthwash helps for about 15 mins. I asked her to call ENT today and see if they had any cancellations. Advised her again that she should reconsider the referral to neurology but she still declined and wanted to see what ENT said first.

## 2022-04-10 NOTE — Telephone Encounter (Signed)
Discussed with patient she verbalized understanding.

## 2022-04-10 NOTE — Telephone Encounter (Signed)
Thanks for the update, will still recommend evaluation by neurology

## 2022-04-11 ENCOUNTER — Telehealth (INDEPENDENT_AMBULATORY_CARE_PROVIDER_SITE_OTHER): Payer: Self-pay | Admitting: *Deleted

## 2022-04-11 NOTE — Telephone Encounter (Signed)
Patient called today to let me know she got her ENT appt moved up from march 6th to feb 28th.

## 2022-04-12 ENCOUNTER — Telehealth: Payer: Self-pay | Admitting: Family Medicine

## 2022-04-12 NOTE — Telephone Encounter (Signed)
Patient aware.

## 2022-04-12 NOTE — Telephone Encounter (Signed)
Please advise, patient just started Celexa 10 mg on 04/05/22.

## 2022-04-12 NOTE — Telephone Encounter (Signed)
It will take 6 weeks for celexa to start working. Continue 10 mg for now

## 2022-04-12 NOTE — Telephone Encounter (Signed)
Pt wants to know if she can take 2 tablets of celexa each day because 1 tablet a day is not helping.

## 2022-04-17 DIAGNOSIS — R070 Pain in throat: Secondary | ICD-10-CM | POA: Diagnosis not present

## 2022-04-17 DIAGNOSIS — R208 Other disturbances of skin sensation: Secondary | ICD-10-CM | POA: Diagnosis not present

## 2022-04-18 ENCOUNTER — Ambulatory Visit (INDEPENDENT_AMBULATORY_CARE_PROVIDER_SITE_OTHER): Payer: Medicare HMO | Admitting: Family Medicine

## 2022-04-18 ENCOUNTER — Encounter: Payer: Self-pay | Admitting: Family Medicine

## 2022-04-18 VITALS — BP 108/60 | HR 93 | Temp 97.4°F | Ht 64.0 in | Wt 158.0 lb

## 2022-04-18 DIAGNOSIS — I10 Essential (primary) hypertension: Secondary | ICD-10-CM | POA: Diagnosis not present

## 2022-04-18 DIAGNOSIS — J069 Acute upper respiratory infection, unspecified: Secondary | ICD-10-CM

## 2022-04-18 DIAGNOSIS — F411 Generalized anxiety disorder: Secondary | ICD-10-CM | POA: Diagnosis not present

## 2022-04-18 NOTE — Progress Notes (Signed)
Acute Office Visit  Subjective:     [name], female    DOB: 03-09-1950, 72 y.o.   MRN: [id number]  Chief Complaint  Patient presents with   Anxiety    Anxiety     Patient is in today for anxiety. She started on citalopram abut 10 days ago. She reports that this is no helping her anxiety so far. She reports feeling very anxious and jittery in the evening. She is taking ativan 1-2x a day as well. She reports some increased stress due to family problems recently.    Upper Respiratory Infection: Patient complains of symptoms of a URI. Symptoms include congestion, cough, and sore throat. Onset of symptoms was 1 days ago, unchanged since that time. She also c/o sneezing for the past 1 day .  She is drinking plenty of fluids. Evaluation to date: none. Treatment to date: none.       04/18/2022    8:20 AM 04/05/2022    1:21 PM 03/22/2022    2:00 PM  Depression screen PHQ 2/9  Decreased Interest 0 0 0  Down, Depressed, Hopeless 0 0 0  PHQ - 2 Score 0 0 0  Altered sleeping 0 0 0  Tired, decreased energy 0 0 0  Change in appetite 0 0 0  Feeling bad or failure about yourself  0 0 0  Trouble concentrating 0 0 0  Moving slowly or fidgety/restless 0 0 0  Suicidal thoughts 0 0 0  PHQ-9 Score 0 0 0  Difficult doing work/chores Not difficult at all Not difficult at all Not difficult at all      04/18/2022    8:20 AM 04/05/2022    1:21 PM 03/22/2022    2:00 PM 03/06/2022   10:51 AM  GAD 7 : Generalized Anxiety Score  Nervous, Anxious, on Edge '1 1 3 '$ 0  Control/stop worrying 0 0 0 0  Worry too much - different things 0 0 0 0  Trouble relaxing 0 0 0 0  Restless 0 0 0 0  Easily annoyed or irritable 0 0 0 0  Afraid - awful might happen 0 0 0 0  Total GAD 7 Score '1 1 3 '$ 0  Anxiety Difficulty Not difficult at all Not difficult at all Not difficult at all Not difficult at all     ROS As per HPI.      Objective:    BP 108/60   Pulse 93   Temp (!) 97.4 F (36.3  C) (Temporal)   Ht '5\' 4"'$  (1.626 m)   Wt 158 lb (71.7 kg)   SpO2 98%   BMI 27.12 kg/m  Wt Readings from Last 3 Encounters:  04/18/22 158 lb (71.7 kg)  04/05/22 159 lb 6 oz (72.3 kg)  03/28/22 163 lb 4.8 oz (74.1 kg)      Physical Exam Vitals and nursing note reviewed.  Constitutional:      General: She is not in acute distress.    Appearance: She is not ill-appearing, toxic-appearing or diaphoretic.  HENT:     Right Ear: Tympanic membrane, ear canal and external ear normal.     Left Ear: Tympanic membrane, ear canal and external ear normal.     Nose: Congestion present.     Mouth/Throat:     Mouth: Mucous membranes are moist.     Pharynx: Oropharynx is clear. No oropharyngeal exudate or posterior oropharyngeal erythema.  Eyes:     General:  Right eye: No discharge.        Left eye: No discharge.     Conjunctiva/sclera: Conjunctivae normal.  Cardiovascular:     Rate and Rhythm: Normal rate and regular rhythm.     Heart sounds: Normal heart sounds. No murmur heard. Pulmonary:     Effort: Pulmonary effort is normal. No respiratory distress.     Breath sounds: Normal breath sounds. No wheezing.  Musculoskeletal:     Cervical back: Neck supple. No rigidity.  Lymphadenopathy:     Cervical: No cervical adenopathy.  Skin:    General: Skin is warm and dry.  Neurological:     General: No focal deficit present.     Mental Status: She is alert and oriented to person, place, and time. Mental status is at baseline.  Psychiatric:        Mood and Affect: Mood normal.        Behavior: Behavior normal.     No results found for any visits on 04/18/22.      Assessment & Plan:   [name] was seen today for anxiety.  Diagnoses and all orders for this visit:  URI with cough and congestion Declined covid testing today. Discussed symptomatic care and return precautions.   Generalized anxiety disorder Discussed that it takes 4-6 weeks for celexa to be effective. Discussed  dose increase at 4 week follow up in no improvement. Discussed lifestyle management.   Primary HTN BP at goal today.    Return in about 4 weeks (around 05/16/2022).  The patient indicates understanding of these issues and agrees with the plan.   Gwenlyn Perking, FNP

## 2022-04-19 ENCOUNTER — Telehealth: Payer: Self-pay | Admitting: Family Medicine

## 2022-04-19 NOTE — Telephone Encounter (Signed)
I spoke to pt and offered a COVID test but pt states she doesn't want to do the test and she requested a zpak. I advised pt she has just had the cough for the past 3 days and no fever and this was discussed during her visit yesterday that antibiotic isn't warranted at this point and to just treat with otc viral symptomatic care and pt voiced understanding.

## 2022-04-23 ENCOUNTER — Encounter: Payer: Self-pay | Admitting: *Deleted

## 2022-04-23 NOTE — Progress Notes (Signed)
Fax notification received from [employer] that Entresto 24/26 mg approved 04/22/2022 through 02/18/2023

## 2022-04-25 ENCOUNTER — Encounter: Payer: Self-pay | Admitting: Nurse Practitioner

## 2022-04-25 ENCOUNTER — Ambulatory Visit: Payer: Medicare HMO | Attending: Nurse Practitioner | Admitting: Nurse Practitioner

## 2022-04-25 VITALS — BP 118/70 | HR 87 | Ht 64.0 in | Wt 156.6 lb

## 2022-04-25 DIAGNOSIS — I050 Rheumatic mitral stenosis: Secondary | ICD-10-CM | POA: Diagnosis not present

## 2022-04-25 DIAGNOSIS — Z950 Presence of cardiac pacemaker: Secondary | ICD-10-CM | POA: Diagnosis not present

## 2022-04-25 DIAGNOSIS — I059 Rheumatic mitral valve disease, unspecified: Secondary | ICD-10-CM | POA: Diagnosis not present

## 2022-04-25 DIAGNOSIS — K146 Glossodynia: Secondary | ICD-10-CM

## 2022-04-25 DIAGNOSIS — I4719 Other supraventricular tachycardia: Secondary | ICD-10-CM

## 2022-04-25 DIAGNOSIS — I442 Atrioventricular block, complete: Secondary | ICD-10-CM | POA: Diagnosis not present

## 2022-04-25 DIAGNOSIS — I5022 Chronic systolic (congestive) heart failure: Secondary | ICD-10-CM | POA: Diagnosis not present

## 2022-04-25 NOTE — Patient Instructions (Signed)
Medication Instructions:  Your physician recommends that you continue on your current medications as directed. Please refer to the Current Medication list given to you today.   Labwork: none  Testing/Procedures: none  Follow-Up:  Your physician recommends that you schedule a follow-up appointment in: 3-4 months  Any Other Special Instructions Will Be Listed Below (If Applicable).  If you need a refill on your cardiac medications before your next appointment, please call your pharmacy.

## 2022-04-25 NOTE — Progress Notes (Signed)
Office Visit    Patient Name: Carol Richmond Date of Encounter: 04/25/2022  PCP:  Gwenlyn Perking, [facility]  Cardiologist:  Rozann Lesches, MD  Advanced Practice Provider:  Finis Bud, NP Electrophysiologist:  Cristopher Peru, MD  [phone]  Chief Complaint    Carol Richmond is a 72 y.o. female with a hx of rheumatic mitral valve disease, moderate to severe mitral valve stenosis, HFmrEF, AVNRT, status post radiofrequency ablation, OSA, CHB, s/p PPM, hypertension, carotid artery disease, type 2 diabetes, history of TIA, presents today for 50-monthfollow-up.  Past Medical History    Past Medical History:  Diagnosis Date   Asthma    AVNRT (AV nodal re-entry tachycardia)    Carotid artery disease (HCC)    [place]    Diverticulosis of colon (without mention of hemorrhage)    Essential hypertension    Functional ovarian cysts    GERD (gastroesophageal reflux disease)    Hemorrhoids, external, without mention of complication    Hormone replacement therapy (postmenopausal)    IBS (irritable bowel syndrome)    Migraine    Mitral valve disease    Pacemaker    Complete heart block - [employer]   Right knee DJD    Seizures (HCC)    Sleep apnea    TIA (transient ischemic attack)    Type 2 diabetes mellitus ([place]    Past Surgical History:  Procedure Laterality Date   BIOPSY  09/23/2018   Procedure: BIOPSY;  Surgeon: RRogene Houston MD;  [facility];  Service: Endoscopy;;  duodeneum and stomach   CHOLECYSTECTOMY  1980   ENDARTERECTOMY Right 07/01/2017   Procedure: ENDARTERECTOMY CAROTID RIGHT;  Surgeon: BSerafina Mitchell MD;  Location: [facility];  Service: Vascular;  Laterality: Right;   ESOPHAGOGASTRODUODENOSCOPY N/A 09/23/2018   Procedure: ESOPHAGOGASTRODUODENOSCOPY (EGD);  Surgeon: RRogene Houston MD;  [facility];  Service: Endoscopy;  Laterality: N/A;  2:45-moved to 1:45 per ALelon Frohlich   ESOPHAGOGASTRODUODENOSCOPY (EGD) WITH PROPOFOL N/A 03/07/2021   Procedure: ESOPHAGOGASTRODUODENOSCOPY (EGD) WITH PROPOFOL;  Surgeon: RRogene Houston MD;  [facility];  Service: Endoscopy;  Laterality: N/A;  12:50   LEFT HEART CATHETERIZATION WITH CORONARY ANGIOGRAM N/A 11/18/2013   Procedure: LEFT HEART CATHETERIZATION WITH CORONARY ANGIOGRAM;  Surgeon: CBurnell Blanks MD;  [facility];  Service: Cardiovascular;  Laterality: N/A;   OOPHORECTOMY     2012 [place]. [place] 12/14[place]   Procedure: PACEMAKER IMPLANT;  Surgeon: TEvans Lance MD;  [facility];  Service: Cardiovascular;  Laterality: N/A;   PATCH ANGIOPLASTY Right 07/01/2017   Procedure: RIGHT CAROTID PATCH ANGIOPLASTY;  Surgeon: BSerafina Mitchell MD;  [facility];  Service: Vascular;  Laterality: Right;   SVT ABLATION N/A 12/14/2021   Procedure: SVT ABLATION;  Surgeon: TEvans Lance MD;  [place];  Service: Cardiovascular;  Laterality: N/A;   TEE WITHOUT CARDIOVERSION N/A 03/01/2022   Procedure: TRANSESOPHAGEAL ECHOCARDIOGRAM (TEE);  Surgeon: CSanda Klein MD;  [place]  Service: Cardiovascular;  Laterality: N/A;   TONSILLECTOMY     TOTAL KNEE ARTHROPLASTY Right 09/02/2012   Procedure: TOTAL KNEE ARTHROPLASTY- right;  Surgeon: DNinetta Lights MD;  [place]  Service: Orthopedics;  Laterality: Right;   TUBAL LIGATION     TYMPANOMASTOIDECTOMY Left 04/02/2016   Procedure: LEFT CANAL WALL DOWN TYMPANOMASTOIDECTOMY;  Surgeon: SLeta Baptist  MD;  Location: [place];  Service: ENT;  Laterality: Left;   Cut Off Right 07/02/2017   Procedure: RIGHT NECK EXPLORATION WITH INTRAOPERATIVE ULTRASOUND;  Surgeon: Angelia Mould, MD;  Location: [place];  Service: Vascular;  Laterality: Right;    Allergies  No Known Allergies  History of Present Illness    Carol Richmond is a 72 y.o. female with a PMH as mentioned above.  Underwent SVT ablation on December 14, 2021, developed complete heart block and therefore underwent PPM placement.  Last seen by Dr. Domenic Polite on January 21, 2022.  TEE was discussed to evaluate rheumatic mitral valve disease, stage D with moderate to severe mitral stenosis.  Discussed that she would not be a candidate for balloon valvuloplasty due to degree of mitral regurgitation.  TEE revealed EF 40 to 45%, no RWMA, rheumatic mitral valve seen with moderate mitral valve regurgitation, moderate mitral valve stenosis with mean gradient at 5.5 mmHg, grade 3 atheroma plaque involving descending aorta noted.  Therefore, further invasive or surgical management was not needed, recommended to focus on medical therapy.  Saw Dr. Lovena Le on March 21, 2022.  Was doing well from a cardiac perspective.  Device was working normally.  Today she presents for 25-monthfollow-up.  Doing well from a cardiac perspective. Denies any chest pain, shortness of breath, palpitations, syncope, presyncope, dizziness, orthopnea, PND, swelling or significant weight changes, acute bleeding, or claudication.  Does admit to tongue burning sensation, has been treated for thrush/yeast infection, [place] in symptoms.  PCP is recommended allergy testing, however patient declines.  EKGs/Labs/Other Studies Reviewed:   The following studies were reviewed today:  EKG:  EKG is not ordered today.     TEE on 03/01/2022: 1. Left ventricular ejection fraction, by estimation, is 40 to 45%. The  left ventricle has mildly decreased function. The left ventricle has no  regional wall motion abnormalities. Left ventricular diastolic function  could not be evaluated.   2. Right ventricular systolic function is normal. The right ventricular  size is normal. Tricuspid regurgitation signal is inadequate for assessing  PA pressure.   3. Left atrial size  was severely dilated. No left atrial/left atrial  appendage thrombus was detected. The LAA emptying velocity was 57 cm/s.   4. Mitral valve area is 1.8 cm sq by PHT method and 1.68 cm sq by 3D  guided planimetry. Effective regurgitant orifice area is 0.34 cm sq by  both 3D guided planimetry and by the PISA method, regurgitant volume 35  ml, regurgitant fraction 33%. There is  systolic dominant forward flow in the pulmonary veins. The mitral valve is  not amenable to valvuloplasty due to the severity of the mitral  insufficiency and due to severe and asymmetrical focal calcification in  the mid anterior leaflet. The mitral valve  is rheumatic. Moderate mitral valve regurgitation. Moderate mitral  stenosis. The mean mitral valve gradient is 5.5 mmHg with average heart  rate of 77 bpm.   5. The aortic valve is tricuspid. Aortic valve regurgitation is trivial.  No aortic stenosis is present.   6. There is Moderate (Grade III) atheroma plaque involving the descending  aorta.  Echo on 01/17/2022: 1. Left ventricular ejection fraction, by estimation, is 40 to 45%. The  left ventricle has mildly decreased function. Left ventricular endocardial  border not optimally defined to evaluate regional wall motion. Left  ventricular diastolic parameters are  consistent with Grade I diastolic dysfunction (impaired relaxation).  Elevated left atrial pressure. The average left ventricular global  longitudinal strain is -15.5 %. The global longitudinal strain is  abnormal.   2. Right ventricular systolic function is normal. The right ventricular  size is normal. Tricuspid regurgitation signal is inadequate for assessing  PA pressure.   3. Left atrial size was mildly dilated.   4. MR jet is posterior and eccentric which makes quantification  difficult. MV/AV VTI ratio is 1.5 suggesting moderate to severe MR. . The  mitral valve is abnormal. Moderate to severe mitral valve regurgitation.  Moderate to  severe mitral stenosis. The  mean mitral valve gradient is 9.0 mmHg. Heart rate of 80 bpm.   5. The aortic valve has an indeterminant number of cusps. There is mild  calcification of the aortic valve. There is mild thickening of the aortic  valve. Aortic valve regurgitation is mild. No aortic stenosis is present.   6. The inferior vena cava is normal in size with greater than 50%  respiratory variability, suggesting right atrial pressure of 3 mmHg.   Comparison(s): Echocardiogram done 09/29/21 showed an EF of 35-40% with  moderate to severe MS with a Mean Grad of 12 mmHg.   Carotid duplex on October 06, 2020: Bilateral ICA stenosis at 1 to 39%.  Myoview on October 06, 2013: Low risk Lexiscan Cardiolite. There were no diagnostic ST segment  abnormalities or arrhythmias noted. Perfusion imaging is most  consistent with variable soft tissue attenuation affecting the mid  anteroseptal wall, no clear evidence of scar or ischemia. LVEF is  calculated at 64% with normal volumes and wall motion.  Recent Labs: 09/28/2021: B Natriuretic Peptide 1,025.0 09/29/2021: Magnesium 1.9; TSH 1.066 03/29/2022: ALT 14; BUN 12; Creat 0.86; Hemoglobin 14.8; Platelets 215; Potassium 4.4; Sodium 140  Recent Lipid Panel    Component Value Date/Time   CHOL 213 (H) 09/12/2021 0927   TRIG 380 (H) 09/12/2021 0927   HDL 47 09/12/2021 0927   CHOLHDL 4.5 (H) 09/12/2021 0927   CHOLHDL 5.2 05/08/2007 0530   VLDL 38 05/08/2007 0530   LDLCALC 102 (H) 09/12/2021 0927    Risk Assessment/Calculations:   The 10-year ASCVD risk score (Arnett DK, et al., 2019) is: 19%   Values used to calculate the score:     Age: 60 years     Sex: Female     Is Non-Hispanic African American: No     Diabetic: Yes     Tobacco smoker: No     Systolic Blood Pressure: 123456 mmHg     Is BP treated: Yes     HDL Cholesterol: 47 mg/dL     Total Cholesterol: 213 mg/dL  Home Medications   Current Meds  Medication Sig   Accu-Chek Softclix  Lancets lancets check blood sugars twice daily DX E11.65   acetaminophen (TYLENOL) 500 MG tablet Take 500 mg by mouth every 6 (six) hours as needed for mild pain.   aspirin EC 81 MG tablet Take 1 tablet (81 mg total) by mouth daily.   carvedilol (COREG) 3.125 MG tablet TAKE (1) TABLET TWICE DAILY.   chlorhexidine (PERIDEX) 0.12 % solution Use as directed 5 mLs in the mouth or throat 2 (two) times daily.   citalopram (CELEXA) 10 MG tablet Take 1 tablet (10 mg total) by mouth daily.   fluticasone (FLONASE) 50 MCG/ACT nasal spray Place 2 sprays into both nostrils daily as needed for allergies or rhinitis.   furosemide (LASIX) 40  MG tablet Take 1 tablet (40 mg total) by mouth daily.   gabapentin (NEURONTIN) 600 MG tablet Take 600 mg by mouth 3 (three) times daily.   glucose blood (ACCU-CHEK GUIDE) test strip Use as instructed to test blood sugar daily as directed. DX:E11.9   lactulose (CHRONULAC) 10 GM/15ML solution TAKE 30 MLS (20 GRAM TOTAL) BY MOUTH 3 TIMES A DAY.   lamoTRIgine (LAMICTAL) 200 MG tablet TAKE (1) TABLET TWICE DAILY.   LORazepam (ATIVAN) 0.5 MG tablet Take 1 tablet (0.5 mg total) by mouth 2 (two) times daily as needed for anxiety.   magic mouthwash (lidocaine, diphenhydrAMINE, alum & mag hydroxide) suspension Swish and swallow 5 mLs 4 (four) times daily as needed for mouth pain.   metFORMIN (GLUCOPHAGE) 500 MG tablet Take 1 tablet (500 mg total) by mouth 2 (two) times daily with a meal.   Multiple Vitamin (MULTIVITAMIN) capsule Take 1 capsule by mouth daily.   naloxone (NARCAN) nasal spray 4 mg/0.1 mL Place 1 spray into the nose once. Take by nasal route every 3 minutes until patient awakes or EMS arrives.   ondansetron (ZOFRAN) 4 MG tablet TAKE 1 TABLET BY MOUTH EVERY 8 HOURS AS NEEDED FOR NAUSEA AND VOMITING.   pantoprazole (PROTONIX) 40 MG tablet TAKE (1) TABLET TWICE DAILY.   potassium chloride SA (KLOR-CON M) 20 MEQ tablet Take 2 tablets (40 mEq total) by mouth daily.    sacubitril-valsartan (ENTRESTO) 24-26 MG Take 1 tablet by mouth 2 (two) times daily.   traMADol (ULTRAM) 50 MG tablet Take 50 mg by mouth daily as needed for severe pain or moderate pain.   traZODone (DESYREL) 50 MG tablet Take 0.5-1 tablets (25-50 mg total) by mouth at bedtime as needed for sleep.     Review of Systems    All other systems reviewed and are otherwise negative except as noted above.  Physical Exam    VS:  BP 118/70   Pulse 87   Ht '5\' 4"'$  (1.626 m)   Wt 156 lb 9.6 oz (71 kg)   SpO2 97%   BMI 26.88 kg/m  , BMI Body mass index is 26.88 kg/m.  Wt Readings from Last 3 Encounters:  04/25/22 156 lb 9.6 oz (71 kg)  04/18/22 158 lb (71.7 kg)  04/05/22 159 lb 6 oz (72.3 kg)     GEN: Well nourished, well developed, in no acute distress. HEENT: normal. Neck: Supple, no JVD, carotid bruits, or masses. Cardiac: [id number], RRR, 2/6 systolic murmur, no rubs, no gallops. No clubbing, cyanosis, edema.  Radials/PT 2+ and equal bilaterally.  Respiratory:  Respirations regular and unlabored, clear to auscultation bilaterally. MS: No deformity or atrophy. Skin: Warm and dry, no rash. Neuro:  Strength and sensation are intact. Psych: Normal affect.  Assessment & Plan    HFmrEF TEE 02/2022 revealed EF 40 to 45%. Euvolemic and well compensated on exam.  Continue current GDMT.  No room to uptitrate GDMT at this time based on current blood pressure. Low sodium diet, fluid restriction <2L, and daily weights encouraged. Educated to contact our office for weight gain of 2 lbs overnight or 5 lbs in one week. Heart healthy diet and regular cardiovascular exercise encouraged.  At next office visit, we will see if Entresto dose can be increased.  Rheumatic mitral valve disease, moderate to severe mitral stenosis TEE revealed mitral valve was not amenable to valvuloplasty due to severity of mitral insufficiency and due to severe/asymmetrical focal calcification of mid anterior leaflet.  Moderate MR  noted with moderate mitral stenosis with gradient at 5.5 mmHg.  Dr. Domenic Polite recommended no further surgical management or invasive management of mitral valve at this time.  Continue current medication regimen.  At next office visit will see if GDMT can be uptitrated.  ED precautions discussed.  AVNRT, s/p ablation CHB, s/p PPM Doing well and denies any symptoms.  Normal device function noted during last device check.  Continue follow-up with Dr. Lovena Le.   5. Burning tongue Etiology uncertain, did recommend vitamin B12, pro-biotic, and multivitamin.  Recommended continue follow-up with PCP and dentist.  Disposition: Follow up in 3 - 4 month(s) with Rozann Lesches, MD or APP.  Signed, Finis Bud, NP 04/25/2022, 11:37 AM [place]

## 2022-04-26 ENCOUNTER — Ambulatory Visit (INDEPENDENT_AMBULATORY_CARE_PROVIDER_SITE_OTHER): Payer: Medicare HMO | Admitting: Family Medicine

## 2022-04-26 ENCOUNTER — Encounter: Payer: Self-pay | Admitting: Family Medicine

## 2022-04-26 VITALS — BP 102/61 | HR 98 | Temp 97.4°F | Ht 64.0 in | Wt 157.0 lb

## 2022-04-26 DIAGNOSIS — J0110 Acute frontal sinusitis, unspecified: Secondary | ICD-10-CM

## 2022-04-26 DIAGNOSIS — F411 Generalized anxiety disorder: Secondary | ICD-10-CM | POA: Diagnosis not present

## 2022-04-26 MED ORDER — AZITHROMYCIN 250 MG PO TABS: ORAL_TABLET | ORAL | 0 refills | Status: DC

## 2022-04-26 NOTE — Progress Notes (Signed)
Acute Office Visit  Subjective:     [name], female    DOB: 1950-06-12, 72 y.o.   MRN: [id number]  Chief Complaint  Patient presents with   Headache   Facial Pain    Sinus Problem The current episode started 1 to 4 weeks ago. The problem is unchanged. There has been no fever. Associated symptoms include congestion, coughing, ear pain, headaches and sinus pressure. Pertinent negatives include no chills, shortness of breath, sneezing, sore throat or swollen glands. Treatments tried: mucinex. The treatment provided no relief.    She is also interested in counseling for her anxiety. She was recently restarted on an SSRI but doesn't feel like it is helping yet. She reports intermittent shaking and nervousness throughout the day. A lot of this is due to stress with her family.       04/26/2022    8:03 AM 04/18/2022    8:20 AM 04/05/2022    1:21 PM  Depression screen PHQ 2/9  Decreased Interest 0 0 0  Down, Depressed, Hopeless 0 0 0  PHQ - 2 Score 0 0 0  Altered sleeping 0 0 0  Tired, decreased energy 0 0 0  Change in appetite 0 0 0  Feeling bad or failure about yourself  0 0 0  Trouble concentrating 0 0 0  Moving slowly or fidgety/restless 0 0 0  Suicidal thoughts 0 0 0  PHQ-9 Score 0 0 0  Difficult doing work/chores Not difficult at all Not difficult at all Not difficult at all      04/26/2022    8:03 AM 04/18/2022    8:20 AM 04/05/2022    1:21 PM 03/22/2022    2:00 PM  GAD 7 : Generalized Anxiety Score  Nervous, Anxious, on Edge 0 '1 1 3  '$ Control/stop worrying 0 0 0 0  Worry too much - different things 0 0 0 0  Trouble relaxing 0 0 0 0  Restless 0 0 0 0  Easily annoyed or irritable 1 0 0 0  Afraid - awful might happen 0 0 0 0  Total GAD 7 Score '1 1 1 3  '$ Anxiety Difficulty Somewhat difficult Not difficult at all Not difficult at all Not difficult at all     Review of Systems  Constitutional:  Negative for chills.  HENT:  Positive for congestion,  ear pain and sinus pressure. Negative for sneezing and sore throat.   Respiratory:  Positive for cough. Negative for shortness of breath.   Neurological:  Positive for headaches.        Objective:    BP 102/61   Pulse 98   Temp (!) 97.4 F (36.3 C) (Temporal)   Ht '5\' 4"'$  (1.626 m)   Wt 157 lb (71.2 kg)   SpO2 97%   BMI 26.95 kg/m    Physical Exam Vitals and nursing note reviewed.  Constitutional:      General: She is not in acute distress.    Appearance: She is not ill-appearing, toxic-appearing or diaphoretic.  HENT:     Head: Normocephalic and atraumatic.     Right Ear: Ear canal and external ear normal. A middle ear effusion is present. Tympanic membrane is not injected, scarred, perforated, erythematous, retracted or bulging.     Left Ear: Ear canal and external ear normal. A middle ear effusion is present. Tympanic membrane is not injected, scarred, perforated, erythematous, retracted or bulging.     Nose: Congestion present.  Right Sinus: Frontal sinus tenderness present. No maxillary sinus tenderness.     Left Sinus: Frontal sinus tenderness present. No maxillary sinus tenderness.     Mouth/Throat:     Mouth: Mucous membranes are moist.     Pharynx: Oropharynx is clear.     Tonsils: No tonsillar exudate or tonsillar abscesses. 1+ on the right. 1+ on the left.  Eyes:     General: No scleral icterus.    Pupils: Pupils are equal, round, and reactive to light. Pupils are equal.  Cardiovascular:     Rate and Rhythm: Normal rate and regular rhythm.     Heart sounds: Normal heart sounds. No murmur heard. Pulmonary:     Effort: Pulmonary effort is normal. No respiratory distress.     Breath sounds: Normal breath sounds. No stridor. No wheezing or rhonchi.  Musculoskeletal:     Cervical back: Neck supple. No rigidity.  Lymphadenopathy:     Cervical: No cervical adenopathy.  Skin:    General: Skin is warm and dry.  Neurological:     General: No focal deficit  present.     Mental Status: She is alert and oriented to person, place, and time.  Psychiatric:        Mood and Affect: Affect is tearful.        Speech: Speech normal.        Behavior: Behavior normal.        Thought Content: Thought content normal.     No results found for any visits on 04/26/22.      Assessment & Plan:   Janace was seen today for headache and facial pain.  Diagnoses and all orders for this visit:  Acute non-recurrent frontal sinusitis Zpak as below. Discussed symptomatic care and return precautions.  -     azithromycin (ZITHROMAX Z-PAK) 250 MG tablet; As directed  Generalized anxiety disorder Reports uncontrolled. Referral for counseling placed. Continue current regimen.  -     Ambulatory referral to Psychology   Follow up as scheduled.   The patient indicates understanding of these issues and agrees with the plan.  Gwenlyn Perking, FNP

## 2022-04-26 NOTE — Patient Instructions (Signed)
Generic xyzal, claritin, or zyrtec for allergies. Flonase nasal spray for allergies.

## 2022-04-30 ENCOUNTER — Other Ambulatory Visit (INDEPENDENT_AMBULATORY_CARE_PROVIDER_SITE_OTHER): Payer: Self-pay | Admitting: Gastroenterology

## 2022-04-30 NOTE — Telephone Encounter (Signed)
I have never prescribed this in the past. We can send magic mouthwash (has lidocaine and Maalox) but it's not only nystatin

## 2022-04-30 NOTE — Telephone Encounter (Signed)
Last seen 03/28/22

## 2022-05-02 NOTE — Progress Notes (Signed)
Remote pacemaker transmission.   

## 2022-05-06 ENCOUNTER — Ambulatory Visit (INDEPENDENT_AMBULATORY_CARE_PROVIDER_SITE_OTHER): Payer: Medicare HMO | Admitting: *Deleted

## 2022-05-06 ENCOUNTER — Encounter: Payer: Self-pay | Admitting: Family Medicine

## 2022-05-06 ENCOUNTER — Ambulatory Visit (INDEPENDENT_AMBULATORY_CARE_PROVIDER_SITE_OTHER): Payer: Medicare HMO | Admitting: Family Medicine

## 2022-05-06 VITALS — BP 99/67 | HR 88 | Temp 97.1°F | Ht 64.0 in | Wt 154.6 lb

## 2022-05-06 DIAGNOSIS — H66003 Acute suppurative otitis media without spontaneous rupture of ear drum, bilateral: Secondary | ICD-10-CM | POA: Diagnosis not present

## 2022-05-06 DIAGNOSIS — E1165 Type 2 diabetes mellitus with hyperglycemia: Secondary | ICD-10-CM

## 2022-05-06 DIAGNOSIS — H66009 Acute suppurative otitis media without spontaneous rupture of ear drum, unspecified ear: Secondary | ICD-10-CM

## 2022-05-06 DIAGNOSIS — I10 Essential (primary) hypertension: Secondary | ICD-10-CM

## 2022-05-06 MED ORDER — AMOXICILLIN-POT CLAVULANATE 875-125 MG PO TABS: 1.0000 | ORAL_TABLET | Freq: Two times a day (BID) | ORAL | 0 refills | Status: DC

## 2022-05-06 MED ORDER — CLOTRIMAZOLE 10 MG MT TROC: OROMUCOSAL | 1 refills | Status: DC

## 2022-05-06 NOTE — Chronic Care Management (AMB) (Signed)
Chronic Care Management   CCM RN Visit Note  05/06/2022 Name: Carol Richmond MRN: [id number] DOB: 11-26-1950  Subjective: Carol Richmond is a 72 y.o. year old female who is a primary care patient of Gwenlyn Perking, FNP. The patient was referred to the Chronic Care Management team for assistance with care management needs subsequent to provider initiation of CCM services and plan of care.    Today's Visit:  Engaged with patient by telephone for follow up visit.        Goals Addressed             This Visit's Progress    CCM (DIABETES) EXPECTED OUTCOME:  MONITOR, SELF-MANAGE AND REDUCE SYMPTOMS OF DIABETES       Current Barriers:  Knowledge Deficits related to Diabetes management Chronic Disease Management support and education needs related to Diabetes and diet Patient reports she checks CBG every other day with fasting readings ranges continuing in 130' with recent readins 131 and 137. Patient is exercising, does not always follow a special diet. Patient saw primary care provider on 04/26/22 and has had "sinus trouble and allergies", is now finished with zithromax, feels better.  Patient reports referral for psychologist placed but has not heard anything.  Planned Interventions: Reviewed medications with patient and discussed importance of medication adherence;        Counseled on importance of regular laboratory monitoring as prescribed;        Discussed plans with patient for ongoing care management follow up and provided patient with direct contact information for care management team;      Advised patient, providing education and rationale, to check cbg once daily and record        call provider for findings outside established parameters;       Review of patient status, including review of consultants reports, relevant laboratory and other test results, and medications completed;       Information given to patient for referral placed- [facility]- contact number  [phone], pt verbalizes understanding and states she will contact them Reinforced carbohydrate modified diet  Symptom Management: Take medications as prescribed   Attend all scheduled provider appointments Call pharmacy for medication refills 3-7 days in advance of running out of medications Attend church or other social activities Perform all self care activities independently  Perform IADL's (shopping, preparing meals, housekeeping, managing finances) independently Call provider office for new concerns or questions  check blood sugar at prescribed times: once daily check feet daily for cuts, sores or redness enter blood sugar readings and medication or insulin into daily log take the blood sugar log to all doctor visits take the blood sugar meter to all doctor visits trim toenails straight across drink 6 to 8 glasses of water each day fill half of plate with vegetables limit fast food meals to no more than 1 per week manage portion size prepare main meal at home 3 to 5 days each week read food labels for fat, fiber, carbohydrates and portion size set a realistic goal Be mindful of carbohydrate intake at each meal (too much bread, pasta, rice, potatoes at one meal with elevate your blood sugar) Referral for psychology sent to [facility]- [phone]  Follow Up Plan: Telephone follow up appointment with care management team member scheduled for:  07/24/22 at 3 pm       CCM (HYPERTENSION) EXPECTED OUTCOME: MONITOR, SELF-MANAGE AND REDUCE SYMPTOMS OF HYPERTENSION       Current Barriers:  Knowledge Deficits related to Hypertension  management Chronic Disease Management support and education needs related to Hypertension, diet Patient reports she is widowed and lives alone, is independent with all aspects of her care, continues to drive, goes to the gym approximately 5 days per week, has adult daughter she can call on if needed, does not monitor blood pressure, is considering obtaining  blood pressure cuff from [facility] and may start checking BP.  Pt reports she does not always follow a special diet. Patient reports she has all medication and taking as prescribed  Planned Interventions: Evaluation of current treatment plan related to hypertension self management and patient's adherence to plan as established by provider;   Reviewed medications with patient and discussed importance of compliance;  Counseled on the importance of exercise goals with target of 150 minutes per week Discussed plans with patient for ongoing care management follow up and provided patient with direct contact information for care management team; Advised patient, providing education and rationale, to monitor blood pressure daily and record, calling PCP for findings outside established parameters;  Advised patient to discuss any issues with blood pressure, medications with provider; Discussed complications of poorly controlled blood pressure such as heart disease, stroke, circulatory complications, vision complications, kidney impairment, sexual dysfunction;  Reinforced low sodium diet and importance of reading food labels  Symptom Management: Take medications as prescribed   Attend all scheduled provider appointments Call pharmacy for medication refills 3-7 days in advance of running out of medications Attend church or other social activities Perform all self care activities independently  Perform IADL's (shopping, preparing meals, housekeeping, managing finances) independently Call provider office for new concerns or questions  check blood pressure weekly choose a place to take my blood pressure (home, clinic or office, retail store) write blood pressure results in a log or diary take blood pressure log to all doctor appointments keep all doctor appointments take medications for blood pressure exactly as prescribed begin an exercise program report new symptoms to your doctor eat more whole grains,  fruits and vegetables, lean meats and healthy fats Continue working out at the gym- keep up the good work! Follow low sodium diet- read food labels for sodium content  Follow Up Plan: Telephone follow up appointment with care management team member scheduled for:  07/24/22 at 3 pm          Plan:Telephone follow up appointment with care management team member scheduled for:  07/24/22 at 3 pm  Jacqlyn Larsen [facility], BSN RN Case Manager [place] [phone]

## 2022-05-06 NOTE — Progress Notes (Unsigned)
Chief Complaint  Patient presents with   Ear Pain    BILATERAL    HPI  Patient presents today for burning in the mouth andtongue. Ears hurting several days. Hearing chronically bad. HAS RHINORRHEA.   PMH: Smoking status noted ROS: Per HPI  Objective: BP 99/67   Pulse 88   Temp (!) 97.1 F (36.2 C)   Ht 5\' 4"  (1.626 m)   Wt 154 lb 9.6 oz (70.1 kg)   SpO2 97%   BMI 26.54 kg/m  Gen: NAD, alert, cooperative with exam HEENT: NCAT, EOMI, PERRL.[name] Tms have glue - like meniscus bilaterally CV: RRR, good S1/S2, no murmur Resp: CTABL, no wheezes, non-labored Abd: SNTND, BS present, no guarding or organomegaly Ext: No edema, warm Neuro: Alert and oriented, No gross deficits  Assessment and plan:  1. Acute suppurative otitis media without spontaneous rupture of ear drum, recurrence not specified, unspecified laterality     Meds ordered this encounter  Medications   amoxicillin-clavulanate (AUGMENTIN) 875-125 MG tablet    Sig: Take 1 tablet by mouth 2 (two) times daily. Take all of this medication    Dispense:  20 tablet    Refill:  0   clotrimazole (MYCELEX) 10 MG troche    Sig: Allow one to dissolve in the mouth 5 times daily For yeast    Dispense:  50 Troche    Refill:  1     Follow up as needed.  Claretta Fraise, MD

## 2022-05-06 NOTE — Patient Instructions (Signed)
Please call the care guide team at [phone] if you need to cancel or reschedule your appointment.   If you are experiencing a Mental Health or [place] or need someone to talk to, please call the Suicide and Crisis Lifeline: 988 call the [place] National Suicide Prevention Lifeline: 828-483-3461 or TTY: 304 683 4994 TTY (432)520-2981) to talk to a trained counselor call 1-800-273-TALK (toll free, 24 hour hotline) go to [facility] [address] [place] [phone]) call the [facility]: [phone] call 911   Following is a copy of the CCM Program Consent:  CCM service includes personalized support from designated clinical staff supervised by the physician, including individualized plan of care and coordination with other care providers 24/7 contact phone numbers for assistance for urgent and routine care needs. Service will only be billed when office clinical staff spend 20 minutes or more in a month to coordinate care. Only one practitioner may furnish and bill the service in a calendar month. The patient may stop CCM services at amy time (effective at the end of the month) by phone call to the office staff. The patient will be responsible for cost sharing (co-pay) or up to 20% of the service fee (after annual deductible is met)  Following is a copy of your full provider care plan:   Goals Addressed             This Visit's Progress    CCM (DIABETES) EXPECTED OUTCOME:  MONITOR, SELF-MANAGE AND REDUCE SYMPTOMS OF DIABETES       Current Barriers:  Knowledge Deficits related to Diabetes management Chronic Disease Management support and education needs related to Diabetes and diet Patient reports she checks CBG every other day with fasting readings ranges continuing in 130' with recent readins 131 and 137. Patient is exercising, does not always follow a special diet. Patient saw primary care provider on  04/26/22 and has had "sinus trouble and allergies", is now finished with zithromax, feels better.  Patient reports referral for psychologist placed but has not heard anything.  Planned Interventions: Reviewed medications with patient and discussed importance of medication adherence;        Counseled on importance of regular laboratory monitoring as prescribed;        Discussed plans with patient for ongoing care management follow up and provided patient with direct contact information for care management team;      Advised patient, providing education and rationale, to check cbg once daily and record        call provider for findings outside established parameters;       Review of patient status, including review of consultants reports, relevant laboratory and other test results, and medications completed;       Information given to patient for referral placed- [facility]- contact number [phone], pt verbalizes understanding and states she will contact them Reinforced carbohydrate modified diet  Symptom Management: Take medications as prescribed   Attend all scheduled provider appointments Call pharmacy for medication refills 3-7 days in advance of running out of medications Attend church or other social activities Perform all self care activities independently  Perform IADL's (shopping, preparing meals, housekeeping, managing finances) independently Call provider office for new concerns or questions  check blood sugar at prescribed times: once daily check feet daily for cuts, sores or redness enter blood sugar readings and medication or insulin into daily log take the blood sugar log to all doctor visits take the blood sugar meter to all  doctor visits trim toenails straight across drink 6 to 8 glasses of water each day fill half of plate with vegetables limit fast food meals to no more than 1 per week manage portion size prepare main meal at home 3 to 5 days each week read food labels  for fat, fiber, carbohydrates and portion size set a realistic goal Be mindful of carbohydrate intake at each meal (too much bread, pasta, rice, potatoes at one meal with elevate your blood sugar) Referral for psychology sent to [facility]- [phone]  Follow Up Plan: Telephone follow up appointment with care management team member scheduled for:  07/24/22 at 3 pm       CCM (HYPERTENSION) EXPECTED OUTCOME: MONITOR, SELF-MANAGE AND REDUCE SYMPTOMS OF HYPERTENSION       Current Barriers:  Knowledge Deficits related to Hypertension management Chronic Disease Management support and education needs related to Hypertension, diet Patient reports she is widowed and lives alone, is independent with all aspects of her care, continues to drive, goes to the gym approximately 5 days per week, has adult daughter she can call on if needed, does not monitor blood pressure, is considering obtaining blood pressure cuff from [facility] and may start checking BP.  Pt reports she does not always follow a special diet. Patient reports she has all medication and taking as prescribed  Planned Interventions: Evaluation of current treatment plan related to hypertension self management and patient's adherence to plan as established by provider;   Reviewed medications with patient and discussed importance of compliance;  Counseled on the importance of exercise goals with target of 150 minutes per week Discussed plans with patient for ongoing care management follow up and provided patient with direct contact information for care management team; Advised patient, providing education and rationale, to monitor blood pressure daily and record, calling PCP for findings outside established parameters;  Advised patient to discuss any issues with blood pressure, medications with provider; Discussed complications of poorly controlled blood pressure such as heart disease, stroke, circulatory complications, vision complications, kidney  impairment, sexual dysfunction;  Reinforced low sodium diet and importance of reading food labels  Symptom Management: Take medications as prescribed   Attend all scheduled provider appointments Call pharmacy for medication refills 3-7 days in advance of running out of medications Attend church or other social activities Perform all self care activities independently  Perform IADL's (shopping, preparing meals, housekeeping, managing finances) independently Call provider office for new concerns or questions  check blood pressure weekly choose a place to take my blood pressure (home, clinic or office, retail store) write blood pressure results in a log or diary take blood pressure log to all doctor appointments keep all doctor appointments take medications for blood pressure exactly as prescribed begin an exercise program report new symptoms to your doctor eat more whole grains, fruits and vegetables, lean meats and healthy fats Continue working out at the gym- keep up the good work! Follow low sodium diet- read food labels for sodium content  Follow Up Plan: Telephone follow up appointment with care management team member scheduled for:  07/24/22 at 3 pm          Patient verbalizes understanding of instructions and care plan provided today and agrees to view in [place]. Active MyChart status and patient understanding of how to access instructions and care plan via MyChart confirmed with patient.     Telephone follow up appointment with care management team member scheduled for:  07/24/22 at 3 pm

## 2022-05-07 ENCOUNTER — Encounter (INDEPENDENT_AMBULATORY_CARE_PROVIDER_SITE_OTHER): Payer: Self-pay | Admitting: *Deleted

## 2022-05-07 ENCOUNTER — Encounter: Payer: Self-pay | Admitting: Family Medicine

## 2022-05-07 ENCOUNTER — Telehealth: Payer: Self-pay | Admitting: Family Medicine

## 2022-05-07 NOTE — Telephone Encounter (Signed)
Contacted patient. Informed that amoxicillin is for both sinus and ears. She complained of upset tummy and diarrhea.  I advised probiotics and yogurt.

## 2022-05-10 ENCOUNTER — Encounter: Payer: Self-pay | Admitting: Family Medicine

## 2022-05-10 ENCOUNTER — Ambulatory Visit (INDEPENDENT_AMBULATORY_CARE_PROVIDER_SITE_OTHER): Payer: Medicare HMO | Admitting: Family Medicine

## 2022-05-10 ENCOUNTER — Telehealth: Payer: Self-pay | Admitting: Cardiology

## 2022-05-10 VITALS — BP 96/56 | HR 66 | Temp 97.7°F | Ht 64.0 in | Wt 156.2 lb

## 2022-05-10 DIAGNOSIS — H66003 Acute suppurative otitis media without spontaneous rupture of ear drum, bilateral: Secondary | ICD-10-CM | POA: Diagnosis not present

## 2022-05-10 DIAGNOSIS — F339 Major depressive disorder, recurrent, unspecified: Secondary | ICD-10-CM | POA: Diagnosis not present

## 2022-05-10 DIAGNOSIS — F411 Generalized anxiety disorder: Secondary | ICD-10-CM

## 2022-05-10 NOTE — Telephone Encounter (Signed)
Pt c/o BP issue: STAT if pt c/o blurred vision, one-sided weakness or slurred speech  1. What are your last 5 BP readings?  96/56  2. Are you having any other symptoms (ex. Dizziness, headache, blurred vision, passed out)?  No   3. What is your BP issue?   Patient saw PCP this afternoon for ear infection. She feels fine otherwise, but BP was 96/56. She was advised to contact cardiology.

## 2022-05-10 NOTE — Patient Instructions (Signed)
Call below to schedule an appointment for counseling:  [facility] [address] [place] [phone] .

## 2022-05-10 NOTE — Progress Notes (Signed)
Acute Office Visit  Subjective:     [name], female    DOB: January 19, 1951, 72 y.o.   MRN: [id number]  Chief Complaint  Patient presents with   Otalgia    Otalgia  There is pain in both ears. This is a new problem. The current episode started in the past 7 days. The problem occurs constantly. The problem has been gradually improving. There has been no fever. Associated symptoms include headaches, rhinorrhea and a sore throat. Pertinent negatives include no abdominal pain, coughing, diarrhea, ear discharge, neck pain or vomiting. She has tried antibiotics for the symptoms. The treatment provided moderate relief. There is no history of a chronic ear infection or a tympanostomy tube.   Currently on augmentin for a bilateral ear infection x 4 days. Ear pain has been improving.       05/10/2022    1:01 PM 05/06/2022    3:51 PM 04/26/2022    8:03 AM  Depression screen PHQ 2/9  Decreased Interest 0 0 0  Down, Depressed, Hopeless 0 0 0  PHQ - 2 Score 0 0 0  Altered sleeping 0  0  Tired, decreased energy 0  0  Change in appetite 0  0  Feeling bad or failure about yourself  0  0  Trouble concentrating 0  0  Moving slowly or fidgety/restless 0  0  Suicidal thoughts 0  0  PHQ-9 Score 0  0  Difficult doing work/chores Not difficult at all  Not difficult at all      05/10/2022    1:01 PM 04/26/2022    8:03 AM 04/18/2022    8:20 AM 04/05/2022    1:21 PM  GAD 7 : Generalized Anxiety Score  Nervous, Anxious, on Edge 0 0 1 1  Control/stop worrying 0 0 0 0  Worry too much - different things 0 0 0 0  Trouble relaxing 0 0 0 0  Restless 0 0 0 0  Easily annoyed or irritable 0 1 0 0  Afraid - awful might happen 0 0 0 0  Total GAD 7 Score 0 1 1 1   Anxiety Difficulty Not difficult at all Somewhat difficult Not difficult at all Not difficult at all     Review of Systems  HENT:  Positive for ear pain, rhinorrhea and sore throat. Negative for ear discharge.   Respiratory:   Negative for cough.   Gastrointestinal:  Negative for abdominal pain, diarrhea and vomiting.  Musculoskeletal:  Negative for neck pain.  Neurological:  Positive for headaches.        Objective:    BP (!) 96/56   Pulse 66   Temp 97.7 F (36.5 C) (Temporal)   Ht 5\' 4"  (1.626 m)   Wt 156 lb 4 oz (70.9 kg)   SpO2 96%   BMI 26.82 kg/m  BP Readings from Last 3 Encounters:  05/10/22 (!) 96/56  05/06/22 99/67  04/26/22 102/61      Physical Exam Vitals and nursing note reviewed.  Constitutional:      General: She is not in acute distress.    Appearance: She is not ill-appearing, toxic-appearing or diaphoretic.  HENT:     Head: Normocephalic and atraumatic.     Right Ear: No drainage, swelling or tenderness. A middle ear effusion is present. No foreign body. No mastoid tenderness. Tympanic membrane is not injected, scarred, perforated, erythematous, retracted or bulging.     Left Ear: No drainage, swelling or tenderness. A middle  ear effusion is present. No foreign body. No mastoid tenderness. Tympanic membrane is not injected, scarred, perforated, erythematous, retracted or bulging.     Mouth/Throat:     Mouth: Mucous membranes are moist.     Pharynx: Oropharynx is clear. No oropharyngeal exudate or posterior oropharyngeal erythema.  Eyes:     General:        Right eye: No discharge.        Left eye: No discharge.     Pupils: Pupils are equal, round, and reactive to light.  Cardiovascular:     Rate and Rhythm: Normal rate. Rhythm irregular.     Heart sounds: Normal heart sounds. No murmur heard. Pulmonary:     Effort: Pulmonary effort is normal. No respiratory distress.     Breath sounds: Normal breath sounds.  Musculoskeletal:     Cervical back: Neck supple. No rigidity.  Lymphadenopathy:     Cervical: No cervical adenopathy.  Skin:    General: Skin is warm.  Neurological:     Mental Status: She is alert and oriented to person, place, and time. Mental status is at  baseline.  Psychiatric:        Mood and Affect: Mood normal.        Behavior: Behavior normal.     No results found for any visits on 05/10/22.      Assessment & Plan:   Carol Richmond was seen today for otalgia.  Diagnoses and all orders for this visit:  Non-recurrent acute suppurative otitis media of both ears without spontaneous rupture of tympanic membranes Improving symptoms. Complete Augmentin as prescribed.   Generalized anxiety disorder Depression, recurrent ([place]) Referral for counseling has been authorized. Contact information given to patient for her to call and schedule. Continue current regimen.    Return to office for new or worsening symptoms, or if symptoms persist.   The patient indicates understanding of these issues and agrees with the plan.   Gwenlyn Perking, FNP

## 2022-05-10 NOTE — Telephone Encounter (Signed)
Reports a little lightheaded when ear problem started. Reports currently taking ABT for ear infection. Denies dizziness, fatigue, weakness. Says she drives and feels fine. Advised that a 96/56 blood pressure is okay and unless she is having symptoms, its not concerning. Advised to stay well hydrated. Advised to get a home blood pressure monitor to be able to check home BP readings. Advised if lightheadedness doesn't improve after treatment for ear infection to call office. Verbalized understanding of plan.

## 2022-05-17 NOTE — Telephone Encounter (Signed)
Please advise target BP range for patient.

## 2022-05-17 NOTE — Telephone Encounter (Signed)
Patient made aware. States that her BP has been 116/50's. States that she has been feeling better and denies being lightheaded or having ear problems.

## 2022-05-17 NOTE — Telephone Encounter (Signed)
Patient wants call back to discuss her blood pressure range.

## 2022-05-19 DIAGNOSIS — E1159 Type 2 diabetes mellitus with other circulatory complications: Secondary | ICD-10-CM

## 2022-05-19 DIAGNOSIS — I10 Essential (primary) hypertension: Secondary | ICD-10-CM

## 2022-05-20 NOTE — Telephone Encounter (Signed)
Left message to return call 

## 2022-05-20 NOTE — Telephone Encounter (Signed)
Patient notified and verbalized understanding. 

## 2022-05-23 ENCOUNTER — Telehealth: Payer: Self-pay | Admitting: Family Medicine

## 2022-05-23 NOTE — Telephone Encounter (Signed)
From what I can tell it looks like she took Augmentin and finish the full course of it, she still having pharyngitis and/or ear pain then I would go to ENT or come back and seen by [place] again and we can reevaluate.

## 2022-05-23 NOTE — Telephone Encounter (Signed)
Pt has been informed and has an appt 5/7 with ENT. She will wait on an appt with them.  Pt also states that she hit her head with trunk of car. States that she does have a knot. Pt denies HA, nausea, vomiting, sensitive to light/sound. She does not want to make an appt here at this time and will call back if needed.

## 2022-05-23 NOTE — Telephone Encounter (Signed)
Patient calling because her throat is not getting any better. Said that she was not sure if PCP would want her to go back to ENT and wanted her opinion. Aware that PCP will be out until 4/10

## 2022-05-29 ENCOUNTER — Other Ambulatory Visit (INDEPENDENT_AMBULATORY_CARE_PROVIDER_SITE_OTHER): Payer: Self-pay | Admitting: Gastroenterology

## 2022-05-29 DIAGNOSIS — K7682 Hepatic encephalopathy: Secondary | ICD-10-CM

## 2022-05-30 NOTE — Telephone Encounter (Signed)
Last seen 03/28/22. Next appt 09/30/22

## 2022-06-03 DIAGNOSIS — G8929 Other chronic pain: Secondary | ICD-10-CM | POA: Diagnosis not present

## 2022-06-03 DIAGNOSIS — J309 Allergic rhinitis, unspecified: Secondary | ICD-10-CM | POA: Diagnosis not present

## 2022-06-03 DIAGNOSIS — K219 Gastro-esophageal reflux disease without esophagitis: Secondary | ICD-10-CM | POA: Diagnosis not present

## 2022-06-03 DIAGNOSIS — R070 Pain in throat: Secondary | ICD-10-CM | POA: Diagnosis not present

## 2022-06-03 DIAGNOSIS — R490 Dysphonia: Secondary | ICD-10-CM | POA: Diagnosis not present

## 2022-06-10 ENCOUNTER — Other Ambulatory Visit: Payer: Self-pay | Admitting: Student

## 2022-06-10 ENCOUNTER — Other Ambulatory Visit: Payer: Self-pay | Admitting: *Deleted

## 2022-06-10 ENCOUNTER — Other Ambulatory Visit (INDEPENDENT_AMBULATORY_CARE_PROVIDER_SITE_OTHER): Payer: Self-pay | Admitting: Gastroenterology

## 2022-06-10 DIAGNOSIS — I10 Essential (primary) hypertension: Secondary | ICD-10-CM

## 2022-06-10 DIAGNOSIS — E1165 Type 2 diabetes mellitus with hyperglycemia: Secondary | ICD-10-CM

## 2022-06-10 MED ORDER — METFORMIN HCL 500 MG PO TABS: 500.0000 mg | ORAL_TABLET | Freq: Two times a day (BID) | ORAL | 0 refills | Status: DC

## 2022-06-12 ENCOUNTER — Other Ambulatory Visit (INDEPENDENT_AMBULATORY_CARE_PROVIDER_SITE_OTHER): Payer: Self-pay | Admitting: Gastroenterology

## 2022-06-12 DIAGNOSIS — K146 Glossodynia: Secondary | ICD-10-CM

## 2022-06-17 ENCOUNTER — Ambulatory Visit (INDEPENDENT_AMBULATORY_CARE_PROVIDER_SITE_OTHER): Payer: Medicare HMO

## 2022-06-17 DIAGNOSIS — I442 Atrioventricular block, complete: Secondary | ICD-10-CM

## 2022-06-18 LAB — CUP PACEART REMOTE DEVICE CHECK
Battery Remaining Longevity: 176 mo
Battery Voltage: 3.18 V
Brady Statistic AP VP Percent: 0 %
Brady Statistic AP VS Percent: 3.11 %
Brady Statistic AS VP Percent: 0 %
Brady Statistic AS VS Percent: 96.89 %
Brady Statistic RA Percent Paced: 3.11 %
Brady Statistic RV Percent Paced: 0 %
Date Time Interrogation Session: 20240428204658
Implantable Lead Connection Status: 753985
Implantable Lead Connection Status: 753985
Implantable Lead Implant Date: 20231027
Implantable Lead Implant Date: 20231027
Implantable Lead Location: 753859
Implantable Lead Location: 753860
Implantable Lead Model: 3830
Implantable Lead Model: 5076
Implantable Pulse Generator Implant Date: 20231027
Lead Channel Impedance Value: 304 Ohm
Lead Channel Impedance Value: 418 Ohm
Lead Channel Impedance Value: 456 Ohm
Lead Channel Impedance Value: 608 Ohm
Lead Channel Pacing Threshold Amplitude: 0.625 V
Lead Channel Pacing Threshold Amplitude: 0.625 V
Lead Channel Pacing Threshold Pulse Width: 0.4 ms
Lead Channel Pacing Threshold Pulse Width: 0.4 ms
Lead Channel Sensing Intrinsic Amplitude: 18.5 mV
Lead Channel Sensing Intrinsic Amplitude: 18.5 mV
Lead Channel Sensing Intrinsic Amplitude: 3 mV
Lead Channel Sensing Intrinsic Amplitude: 3 mV
Lead Channel Setting Pacing Amplitude: 2 V
Lead Channel Setting Pacing Amplitude: 2.5 V
Lead Channel Setting Pacing Pulse Width: 0.4 ms
Lead Channel Setting Sensing Sensitivity: 1.2 mV
Zone Setting Status: 755011

## 2022-06-20 ENCOUNTER — Other Ambulatory Visit: Payer: Self-pay | Admitting: Family Medicine

## 2022-06-20 ENCOUNTER — Other Ambulatory Visit (INDEPENDENT_AMBULATORY_CARE_PROVIDER_SITE_OTHER): Payer: Self-pay | Admitting: Gastroenterology

## 2022-06-20 DIAGNOSIS — R569 Unspecified convulsions: Secondary | ICD-10-CM

## 2022-06-20 DIAGNOSIS — R110 Nausea: Secondary | ICD-10-CM

## 2022-06-20 NOTE — Telephone Encounter (Signed)
Last seen 03/28/22 

## 2022-06-21 ENCOUNTER — Ambulatory Visit: Payer: Medicare HMO | Admitting: Family Medicine

## 2022-06-24 ENCOUNTER — Other Ambulatory Visit: Payer: Self-pay | Admitting: Family Medicine

## 2022-06-24 DIAGNOSIS — F5104 Psychophysiologic insomnia: Secondary | ICD-10-CM

## 2022-06-24 NOTE — Telephone Encounter (Signed)
Last office visit 04/26/22 Upcoming appointment 06/27/22 Last refill 02/20/22 #30, 3 refills

## 2022-06-25 NOTE — Patient Instructions (Signed)
Our records indicate that you are due for your screening mammogram.  Please call the imaging center that does your yearly mammograms to make an appointment for a mammogram at your earliest convenience. Our office also has a mobile unit through the [facility] that comes to our location. Please call our office if you would like to make an appointment.   

## 2022-06-27 ENCOUNTER — Ambulatory Visit (INDEPENDENT_AMBULATORY_CARE_PROVIDER_SITE_OTHER): Payer: Medicare HMO | Admitting: Family Medicine

## 2022-06-27 ENCOUNTER — Encounter: Payer: Self-pay | Admitting: Family Medicine

## 2022-06-27 VITALS — BP 107/62 | HR 73 | Temp 98.5°F | Ht 64.0 in | Wt 155.4 lb

## 2022-06-27 DIAGNOSIS — J301 Allergic rhinitis due to pollen: Secondary | ICD-10-CM

## 2022-06-27 DIAGNOSIS — H9202 Otalgia, left ear: Secondary | ICD-10-CM | POA: Diagnosis not present

## 2022-06-27 MED ORDER — LEVOCETIRIZINE DIHYDROCHLORIDE 5 MG PO TABS
5.0000 mg | ORAL_TABLET | Freq: Every evening | ORAL | 1 refills | Status: DC
Start: 2022-06-27 — End: 2022-08-15

## 2022-06-27 NOTE — Progress Notes (Signed)
   Acute Office Visit  Subjective:     [name], female    DOB: 09/06/1950, 72 y.o.   MRN: [id number]  Chief Complaint  Patient presents with   Otalgia    Otalgia  There is pain in the left ear. This is a new problem. The current episode started 1 to 4 weeks ago. There has been no fever. Associated symptoms include rhinorrhea. Pertinent negatives include no abdominal pain, coughing, diarrhea, ear discharge, headaches, hearing loss, neck pain, rash, sore throat or vomiting. She has tried nothing for the symptoms.   Recently got new hearings aids. Pain only occurs when she is wearing her hearing aid.   She has been taking OTC zyrtec without improvement of allergy symptoms.  Review of Systems  HENT:  Positive for ear pain and rhinorrhea. Negative for ear discharge, hearing loss and sore throat.   Respiratory:  Negative for cough.   Gastrointestinal:  Negative for abdominal pain, diarrhea and vomiting.  Musculoskeletal:  Negative for neck pain.  Skin:  Negative for rash.  Neurological:  Negative for headaches.        Objective:    BP 107/62   Pulse 73   Temp 98.5 F (36.9 C) (Temporal)   Ht 5\' 4"  (1.626 m)   Wt 155 lb 6 oz (70.5 kg)   SpO2 98%   BMI 26.67 kg/m  Wt Readings from Last 3 Encounters:  06/27/22 155 lb 6 oz (70.5 kg)  05/10/22 156 lb 4 oz (70.9 kg)  05/06/22 154 lb 9.6 oz (70.1 kg)      Physical Exam Vitals and nursing note reviewed.  Constitutional:      General: She is not in acute distress.    Appearance: She is not ill-appearing, toxic-appearing or diaphoretic.  HENT:     Right Ear: Ear canal and external ear normal. No middle ear effusion. Tympanic membrane is not perforated, erythematous, retracted or bulging.     Left Ear: Ear canal and external ear normal.  No middle ear effusion. Tympanic membrane is not perforated, erythematous, retracted or bulging.     Nose: Rhinorrhea present.     Right Turbinates: Swollen and pale.      Left Turbinates: Swollen and pale.  Pulmonary:     Effort: Pulmonary effort is normal. No respiratory distress.     Breath sounds: Normal breath sounds.  Skin:    General: Skin is warm and dry.  Neurological:     General: No focal deficit present.     Mental Status: She is alert and oriented to person, place, and time.  Psychiatric:        Mood and Affect: Mood normal.        Behavior: Behavior normal.     No results found for any visits on 06/27/22.      Assessment & Plan:   Carol Richmond was seen today for otalgia.  Diagnoses and all orders for this visit:  Seasonal allergic rhinitis due to pollen Stop zyrtec and try xyzal daily as below.  -     levocetirizine (XYZAL) 5 MG tablet; Take 1 tablet (5 mg total) by mouth every evening.  Left ear pain Only occurs when wearing new hearing aid. Discussed no signs of infection. Follow up with audiology if pain continues.   Keep appt for chronic follow up.   The patient indicates understanding of these issues and agrees with the plan.  Gabriel Earing, FNP

## 2022-07-02 ENCOUNTER — Other Ambulatory Visit: Payer: Self-pay | Admitting: Family Medicine

## 2022-07-02 ENCOUNTER — Other Ambulatory Visit: Payer: Self-pay | Admitting: Cardiology

## 2022-07-08 ENCOUNTER — Other Ambulatory Visit: Payer: Self-pay | Admitting: Family Medicine

## 2022-07-08 DIAGNOSIS — F411 Generalized anxiety disorder: Secondary | ICD-10-CM

## 2022-07-08 DIAGNOSIS — Z79899 Other long term (current) drug therapy: Secondary | ICD-10-CM

## 2022-07-08 DIAGNOSIS — F1320 Sedative, hypnotic or anxiolytic dependence, uncomplicated: Secondary | ICD-10-CM

## 2022-07-08 NOTE — Telephone Encounter (Signed)
Only 15 day supply was sent in for her. Needs directions changed to make it a 30 day supply with refills.

## 2022-07-08 NOTE — Telephone Encounter (Signed)
Pt says she only has 2 pills left and needs this issue corrected and sent to [facility] ASAP.   Can covering provider advise on this.

## 2022-07-08 NOTE — Telephone Encounter (Signed)
This is a controlled substance and she is under contract with PCP.  I will cc this to her to follow up on when she returns.

## 2022-07-10 ENCOUNTER — Telehealth: Payer: Self-pay | Admitting: Family Medicine

## 2022-07-10 ENCOUNTER — Ambulatory Visit (INDEPENDENT_AMBULATORY_CARE_PROVIDER_SITE_OTHER): Payer: Medicare HMO | Admitting: Family Medicine

## 2022-07-10 ENCOUNTER — Encounter: Payer: Self-pay | Admitting: Family Medicine

## 2022-07-10 ENCOUNTER — Ambulatory Visit: Payer: Medicare HMO | Admitting: Family Medicine

## 2022-07-10 VITALS — BP 112/68 | HR 67 | Temp 97.4°F | Ht 64.0 in | Wt 156.2 lb

## 2022-07-10 DIAGNOSIS — G8929 Other chronic pain: Secondary | ICD-10-CM

## 2022-07-10 DIAGNOSIS — R591 Generalized enlarged lymph nodes: Secondary | ICD-10-CM

## 2022-07-10 NOTE — Progress Notes (Signed)
Subjective:  [name], female    DOB: November 29, 1950, 72 y.o.   MRN: [id number]  Patient Care Team: Gabriel Earing, FNP as PCP - General (Family Medicine) Jonelle Sidle, MD as PCP - Cardiology (Cardiology) Marinus Maw, MD as PCP - Electrophysiology (Cardiology) Van Clines, MD as Consulting Physician (Neurology) Malissa Hippo, MD (Inactive) as Consulting Physician (Gastroenterology) Desiree Lucy, [place] (Optometry) Chuck Hint, MD as Consulting Physician (Vascular Surgery) Danella Maiers, [facility] as Pharmacist (Family Medicine) Audrie Gallus, RN as [facility] Care Management Sharlene Dory, NP as Nurse Practitioner (Cardiology)   Chief Complaint:  Ear Pain (Bilateral but worse on right side. Been to ENT twice and they said she didn't see nothing. ) and Thrush (Ongoing on and off )   HPI: Carol Richmond is a 72 y.o. female presenting on 07/10/2022 for Ear Pain (Bilateral but worse on right side. Been to ENT twice and they said she didn't see nothing. ) and Thrush (Ongoing on and off )   Pt c/o Bialteral ear pain L>R this am. Has appointment with Dr Jodean Lima EENT Friday 07/12/22. Pt states that it doesn't hurt right now Haorse voice Worse in morning and evenings worse on R side. Unable to number It just hurts.    Relevant past medical, surgical, family, and social history reviewed and updated as indicated.  Allergies and medications reviewed and updated. Data reviewed: Chart in Epic.   Past Medical History:  Diagnosis Date   Asthma    AVNRT (AV nodal re-entry tachycardia)    Carotid artery disease (HCC)    Cirrhosis (HCC)    Diverticulosis of colon (without mention of hemorrhage)    Essential hypertension    Functional ovarian cysts    GERD (gastroesophageal reflux disease)    Hemorrhoids, external, without mention of complication    Hormone replacement therapy (postmenopausal)    IBS (irritable bowel syndrome)     Migraine    Mitral valve disease    Pacemaker    Complete heart block - Medtronic   Right knee DJD    Seizures (HCC)    Sleep apnea    TIA (transient ischemic attack)    Type 2 diabetes mellitus (HCC)     Past Surgical History:  Procedure Laterality Date   BIOPSY  09/23/2018   Procedure: BIOPSY;  Surgeon: Malissa Hippo, MD;  [facility];  Service: Endoscopy;;  duodeneum and stomach   CHOLECYSTECTOMY  1980   ENDARTERECTOMY Right 07/01/2017   Procedure: ENDARTERECTOMY CAROTID RIGHT;  Surgeon: Nada Libman, MD;  Location: [facility] OR;  Service: Vascular;  Laterality: Right;   ESOPHAGOGASTRODUODENOSCOPY N/A 09/23/2018   Procedure: ESOPHAGOGASTRODUODENOSCOPY (EGD);  Surgeon: Malissa Hippo, MD;  [facility]: [facility];  Service: Endoscopy;  Laterality: N/A;  2:45-moved to 1:45 per Dewayne Hatch   ESOPHAGOGASTRODUODENOSCOPY (EGD) WITH PROPOFOL N/A 03/07/2021   Procedure: ESOPHAGOGASTRODUODENOSCOPY (EGD) WITH PROPOFOL;  Surgeon: Malissa Hippo, MD;  [facility];  Service: Endoscopy;  Laterality: N/A;  12:50   LEFT HEART CATHETERIZATION WITH CORONARY ANGIOGRAM N/A 11/18/2013   Procedure: LEFT HEART CATHETERIZATION WITH CORONARY ANGIOGRAM;  Surgeon: Kathleene Hazel, MD;  Location: [facility] CATH LAB;  Service: Cardiovascular;  Laterality: N/A;   OOPHORECTOMY     2012 Dr. Donzetta Matters in [place]   PACEMAKER IMPLANT N/A 12/14/2021   Procedure: PACEMAKER IMPLANT;  Surgeon: Marinus Maw, MD;  Location: [facility] INVASIVE CV LAB;  Service: Cardiovascular;  Laterality: N/A;   PATCH ANGIOPLASTY Right 07/01/2017   Procedure: RIGHT CAROTID PATCH ANGIOPLASTY;  Surgeon: Nada Libman, MD;  Location: [facility] OR;  Service: Vascular;  Laterality: Right;   SVT ABLATION N/A 12/14/2021   Procedure: SVT ABLATION;  Surgeon: Marinus Maw, MD;  Location: [facility] INVASIVE CV LAB;  Service: Cardiovascular;  Laterality: N/A;   TEE WITHOUT CARDIOVERSION N/A 03/01/2022   Procedure: TRANSESOPHAGEAL ECHOCARDIOGRAM (TEE);   Surgeon: Thurmon Fair, MD;  Location: [facility] ENDOSCOPY;  Service: Cardiovascular;  Laterality: N/A;   TONSILLECTOMY     TOTAL KNEE ARTHROPLASTY Right 09/02/2012   Procedure: TOTAL KNEE ARTHROPLASTY- right;  Surgeon: Loreta Ave, MD;  Location: [facility] OR;  Service: Orthopedics;  Laterality: Right;   TUBAL LIGATION     TYMPANOMASTOIDECTOMY Left 04/02/2016   Procedure: LEFT CANAL WALL DOWN TYMPANOMASTOIDECTOMY;  Surgeon: Newman Pies, MD;  Location: [facility];  Service: ENT;  Laterality: Left;   VAGINAL HYSTERECTOMY  1990   WOUND EXPLORATION Right 07/02/2017   Procedure: RIGHT NECK EXPLORATION WITH INTRAOPERATIVE ULTRASOUND;  Surgeon: Chuck Hint, MD;  Location: [facility] OR;  Service: Vascular;  Laterality: Right;    Social History   Socioeconomic History   Marital status: Widowed    Spouse name: Not on file   Number of children: 3   Years of education: Not on file   Highest education level: Not on file  Occupational History   Occupation: retired  Tobacco Use   Smoking status: Never   Smokeless tobacco: Never  Vaping Use   Vaping Use: Never used  Substance and Sexual Activity   Alcohol use: No    Alcohol/week: 0.0 standard drinks of alcohol   Drug use: No   Sexual activity: Not Currently  Other Topics Concern   Not on file  Social History Narrative   Lives alone. Granddaughter lives across the street, her 3 children all live nearby   Social Determinants of Health   Financial Resource Strain: Low Risk  (01/04/2022)   Overall Financial Resource Strain (CARDIA)    Difficulty of Paying Living Expenses: Not very hard  Food Insecurity: No Food Insecurity (01/04/2022)   Hunger Vital Sign    Worried About Running Out of Food in the Last Year: Never true    Ran Out of Food in the Last Year: Never true  Transportation Needs: No Transportation Needs (01/04/2022)   PRAPARE - Administrator, Civil Service (Medical): No    Lack of Transportation (Non-Medical):  No  Physical Activity: Sufficiently Active (01/04/2022)   Exercise Vital Sign    Days of Exercise per Week: 5 days    Minutes of Exercise per Session: 30 min  Stress: No Stress Concern Present (01/04/2022)   [employer] of Occupational Health - Occupational Stress Questionnaire    Feeling of Stress : Not at all  Social Connections: Moderately Integrated (01/04/2022)   Social Connection and Isolation Panel [NHANES]    Frequency of Communication with Friends and Family: More than three times a week    Frequency of Social Gatherings with Friends and Family: More than three times a week    Attends Religious Services: More than 4 times per year    Active Member of [employer] or Organizations: Yes    Attends Banker Meetings: More than 4 times per year    Marital Status: Widowed  Intimate Partner Violence: Not At Risk (07/05/2021)   Humiliation, Afraid, Rape, and Kick questionnaire    Fear of Current  or Ex-Partner: No    Emotionally Abused: No    Physically Abused: No    Sexually Abused: No    Outpatient Encounter Medications as of 07/10/2022  Medication Sig   Accu-Chek Softclix Lancets lancets check blood sugars twice daily DX E11.65   acetaminophen (TYLENOL) 500 MG tablet Take 500 mg by mouth every 6 (six) hours as needed for mild pain.   aspirin EC 81 MG tablet Take 1 tablet (81 mg total) by mouth daily.   carvedilol (COREG) 3.125 MG tablet TAKE (1) TABLET TWICE DAILY.   citalopram (CELEXA) 10 MG tablet TAKE 1 TABLET BY MOUTH ONCE DAILY.   clotrimazole (MYCELEX) 10 MG troche Allow one to dissolve in the mouth 5 times daily For yeast   fluticasone (FLONASE) 50 MCG/ACT nasal spray Place 2 sprays into both nostrils daily as needed for allergies or rhinitis.   furosemide (LASIX) 40 MG tablet TAKE 1 TABLET ONCE DAILY.   gabapentin (NEURONTIN) 600 MG tablet Take 600 mg by mouth 3 (three) times daily.   glucose blood (ACCU-CHEK GUIDE) test strip Use as instructed to test blood  sugar daily as directed. DX:E11.9   lactulose (CHRONULAC) 10 GM/15ML solution TAKE 30 MLS (20 GRAM TOTAL) BY MOUTH 3 TIMES A DAY.   lamoTRIgine (LAMICTAL) 200 MG tablet TAKE (1) TABLET TWICE DAILY.   levocetirizine (XYZAL) 5 MG tablet Take 1 tablet (5 mg total) by mouth every evening.   LORazepam (ATIVAN) 0.5 MG tablet Take 1 tablet (0.5 mg total) by mouth 2 (two) times daily as needed for anxiety.   magic mouthwash (lidocaine, diphenhydrAMINE, alum & mag hydroxide) suspension swish and swallow 5 mls 4 times daily as needed for mouth pain   metFORMIN (GLUCOPHAGE) 500 MG tablet Take 1 tablet (500 mg total) by mouth 2 (two) times daily with a meal. (NEEDS TO BE SEEN BEFORE NEXT REFILL)   Multiple Vitamin (MULTIVITAMIN) capsule Take 1 capsule by mouth daily.   naloxone (NARCAN) nasal spray 4 mg/0.1 mL Place 1 spray into the nose once. Take by nasal route every 3 minutes until patient awakes or EMS arrives.   ondansetron (ZOFRAN) 4 MG tablet TAKE 1 TABLET BY MOUTH EVERY 8 HOURS AS NEEDED FOR NAUSEA AND VOMITING.   pantoprazole (PROTONIX) 40 MG tablet TAKE (1) TABLET TWICE DAILY.   potassium chloride SA (KLOR-CON M) 20 MEQ tablet take 2 tablets by mouth daily.   sacubitril-valsartan (ENTRESTO) 24-26 MG Take 1 tablet by mouth 2 (two) times daily.   traMADol (ULTRAM) 50 MG tablet Take 50 mg by mouth daily as needed for severe pain or moderate pain.   traZODone (DESYREL) 50 MG tablet TAKE 1/2 TO 1 TABLET AT BEDTIME AS NEEDED FOR SLEEP.   No facility-administered encounter medications on file as of 07/10/2022.    No Known Allergies  Review of Systems  Constitutional: Negative.   HENT:  Positive for ear pain, hearing loss and sore throat.   Eyes: Negative.   Respiratory:  Positive for cough.   Cardiovascular: Negative.   Gastrointestinal: Negative.   Endocrine: Negative.   Genitourinary: Negative.   Musculoskeletal: Negative.   Skin: Negative.   Allergic/Immunologic: Positive for environmental  allergies.  Neurological: Negative.   Hematological:  Positive for adenopathy.  Psychiatric/Behavioral: Negative.    All other systems reviewed and are negative.       Objective:  BP 112/68   Pulse 67   Temp (!) 97.4 F (36.3 C) (Temporal)   Ht 5\' 4"  (1.626 m)  Wt 156 lb 3.2 oz (70.9 kg)   SpO2 100%   BMI 26.81 kg/m    Wt Readings from Last 3 Encounters:  07/10/22 156 lb 3.2 oz (70.9 kg)  06/27/22 155 lb 6 oz (70.5 kg)  05/10/22 156 lb 4 oz (70.9 kg)    Physical Exam Vitals and nursing note reviewed.  Constitutional:      General: She is not in acute distress.    Appearance: Normal appearance. She is not ill-appearing, toxic-appearing or diaphoretic.  HENT:     Head: Normocephalic and atraumatic.     Left Ear: There is impacted cerumen.     Mouth/Throat:     Mouth: Mucous membranes are moist.  Eyes:     Extraocular Movements: Extraocular movements intact.     Conjunctiva/sclera: Conjunctivae normal.     Pupils: Pupils are equal, round, and reactive to light.  Neck:     Trachea: Trachea and phonation normal.   Cardiovascular:     Rate and Rhythm: Normal rate and regular rhythm.     Pulses: Normal pulses.     Heart sounds: Murmur heard.  Pulmonary:     Effort: Pulmonary effort is normal.     Breath sounds: Normal breath sounds.  Abdominal:     General: Bowel sounds are normal.     Palpations: Abdomen is soft.  Musculoskeletal:        General: Normal range of motion.     Cervical back: Normal range of motion. Normal range of motion.     Right lower leg: No edema.     Left lower leg: No edema.  Lymphadenopathy:     Cervical: Cervical adenopathy present.  Skin:    General: Skin is warm and dry.     Capillary Refill: Capillary refill takes less than 2 seconds.  Neurological:     General: No focal deficit present.     Mental Status: She is alert and oriented to person, place, and time.  Psychiatric:        Mood and Affect: Mood normal.        Behavior:  Behavior normal.        Thought Content: Thought content normal.        Judgment: Judgment normal.     Results for orders placed or performed in visit on 06/17/22  CUP PACEART REMOTE DEVICE CHECK  Result Value Ref Range   Date Time Interrogation Session [id number]    Pulse Generator Manufacturer MERM    Pulse Gen Model [id number] Azure XT DR MRI    Pulse Gen Serial Number [id number]    Clinic Name [facility]    Implantable Pulse Generator Type Implantable Pulse Generator    Implantable Pulse Generator Implant Date 62130865    Implantable Lead Manufacturer MERM    Implantable Lead Model 3830 SelectSecure MRI SureScan    Implantable Lead Serial Number [id number]    Implantable Lead Implant Date 28413244    Implantable Lead Location Detail 1 UNKNOWN    Implantable Lead Special Function LBBB    Implantable Lead Location [id number]    Implantable Lead Connection Status [id number]    Implantable Lead Manufacturer MERM    Implantable Lead Model 5076 CapSureFix Novus MRI SureScan    Implantable Lead Serial Number [id number]    Implantable Lead Implant Date [id number]    Implantable Lead Location Detail 1 APPENDAGE    Implantable Lead Location [id number]    Implantable Lead Connection Status [id number]    Lead Channel Setting Sensing Sensitivity  1.2 mV   Lead Channel Setting Pacing Amplitude 2 V   Lead Channel Setting Pacing Pulse Width 0.4 ms   Lead Channel Setting Pacing Amplitude 2.5 V   Zone Setting Status 755011    Zone Setting Status Active    Lead Channel Impedance Value 418 ohm   Lead Channel Impedance Value 304 ohm   Lead Channel Sensing Intrinsic Amplitude 3 mV   Lead Channel Sensing Intrinsic Amplitude 3 mV   Lead Channel Pacing Threshold Amplitude 0.625 V   Lead Channel Pacing Threshold Pulse Width 0.4 ms   Lead Channel Impedance Value 608 ohm   Lead Channel Impedance Value 456 ohm   Lead Channel Sensing Intrinsic Amplitude 18.5 mV   Lead Channel Sensing Intrinsic Amplitude 18.5 mV    Lead Channel Pacing Threshold Amplitude 0.625 V   Lead Channel Pacing Threshold Pulse Width 0.4 ms   Battery Status OK    Battery Remaining Longevity 176 mo   Battery Voltage 3.18 V   Brady Statistic RA Percent Paced 3.11 %   Brady Statistic RV Percent Paced 0 %   Brady Statistic AP VP Percent 0 %   Brady Statistic AS VP Percent 0 %   Brady Statistic AP VS Percent 3.11 %   Brady Statistic AS VS Percent 96.89 %       Pertinent labs & imaging results that were available during my care of the patient were reviewed by me and considered in my medical decision making.  Assessment & Plan:  Rowan was seen today for ear pain and thrush.  Diagnoses and all orders for this visit:  Lymphadenopathy -     US Soft Tissue Head/Neck (NON-THYROID); Future  Follow up plan: Keep appointment with Dr Cherie Ouch 24May2024.  Keep Appointment with Harlow Mares NP for annual exam 21 July 2022 Ultrasound of Right Neck appointment  PRN OTC tylenol for pain per package directions Return if pain or condition worsens   Continue healthy lifestyle choices, including diet (rich in fruits, vegetables, and lean proteins, and low in salt and simple carbohydrates) and exercise (at least 30 minutes of moderate physical activity daily).  Educational handout given for healthy eating and self care   The above assessment and management plan was discussed with the patient. The patient verbalized understanding of and has agreed to the management plan. Patient is aware to call the clinic if they develop any new symptoms or if symptoms persist or worsen. Patient is aware when to return to the clinic for a follow-up visit. Patient educated on when it is appropriate to go to the emergency department.   Maryelizabeth Kaufmann AGNP Student [facility] [phone]   I personally was present during the history, physical exam, and medical decision-making activities of this visit and have verified that the  services and findings are accurately documented in the nurse practitioner student's note.  Kari Baars, FNP-C [facility] [address] [place], [place] [address] [phone]

## 2022-07-10 NOTE — Patient Instructions (Addendum)
Keep appointment with Dr Cherie Ouch 24May2024.  Keep Appointment with Harlow Mares NP for annual exam 21 July 2022 Ultraasound of Right Neck appointment

## 2022-07-10 NOTE — Telephone Encounter (Signed)
REFERRAL REQUEST Telephone Note  Have you been seen at our office for this problem? YES (Advise that they may need an appointment with their PCP before a referral can be done)  Reason for Referral: THROAT Referral discussed with patient: YES Best contact number of patient for referral team: [phone]   Has patient been seen by a specialist for this issue before: NO Patient provider preference for referral: NO Patient location preference for referral: [facility]. Does not want to drive to [place] and needs appt to be at the end of June or later.    Patient notified that referrals can take up to a week or longer to process. If they haven't heard anything within a week they should call back and speak with the referral department.

## 2022-07-10 NOTE — Telephone Encounter (Signed)
It looks like 6 months worth of this prescription was sent to the patient's pharmacy in February, so there should be refills at the pharmacy

## 2022-07-12 DIAGNOSIS — H95122 Granulation of postmastoidectomy cavity, left ear: Secondary | ICD-10-CM | POA: Diagnosis not present

## 2022-07-17 ENCOUNTER — Other Ambulatory Visit: Payer: Self-pay | Admitting: Family Medicine

## 2022-07-17 DIAGNOSIS — Z79899 Other long term (current) drug therapy: Secondary | ICD-10-CM

## 2022-07-17 DIAGNOSIS — F411 Generalized anxiety disorder: Secondary | ICD-10-CM

## 2022-07-17 DIAGNOSIS — F1320 Sedative, hypnotic or anxiolytic dependence, uncomplicated: Secondary | ICD-10-CM

## 2022-07-17 NOTE — Telephone Encounter (Signed)
Referral placed.

## 2022-07-18 NOTE — Patient Instructions (Addendum)
Take citalopram every other day for the next 2 weeks and then 2 times a week for a week, then stop taking.   [facility] [place] [address], [place] - [place] [address] [phone]  Our records indicate that you are due for your screening mammogram.  Please call the imaging center that does your yearly mammograms to make an appointment for a mammogram at your earliest convenience. Our office also has a mobile unit through the [facility] that comes to our location. Please call our office if you would like to make an appointment.

## 2022-07-19 NOTE — Progress Notes (Signed)
Remote pacemaker transmission.   

## 2022-07-22 ENCOUNTER — Encounter: Payer: Self-pay | Admitting: Family Medicine

## 2022-07-22 ENCOUNTER — Ambulatory Visit (INDEPENDENT_AMBULATORY_CARE_PROVIDER_SITE_OTHER): Payer: Medicare HMO | Admitting: Family Medicine

## 2022-07-22 VITALS — BP 110/70 | HR 72 | Temp 97.5°F | Ht 64.0 in | Wt 153.0 lb

## 2022-07-22 DIAGNOSIS — F339 Major depressive disorder, recurrent, unspecified: Secondary | ICD-10-CM | POA: Diagnosis not present

## 2022-07-22 DIAGNOSIS — I110 Hypertensive heart disease with heart failure: Secondary | ICD-10-CM

## 2022-07-22 DIAGNOSIS — E785 Hyperlipidemia, unspecified: Secondary | ICD-10-CM

## 2022-07-22 DIAGNOSIS — E1159 Type 2 diabetes mellitus with other circulatory complications: Secondary | ICD-10-CM | POA: Diagnosis not present

## 2022-07-22 DIAGNOSIS — F5104 Psychophysiologic insomnia: Secondary | ICD-10-CM

## 2022-07-22 DIAGNOSIS — F411 Generalized anxiety disorder: Secondary | ICD-10-CM

## 2022-07-22 DIAGNOSIS — K7460 Unspecified cirrhosis of liver: Secondary | ICD-10-CM

## 2022-07-22 DIAGNOSIS — E1165 Type 2 diabetes mellitus with hyperglycemia: Secondary | ICD-10-CM

## 2022-07-22 DIAGNOSIS — F1320 Sedative, hypnotic or anxiolytic dependence, uncomplicated: Secondary | ICD-10-CM

## 2022-07-22 DIAGNOSIS — I152 Hypertension secondary to endocrine disorders: Secondary | ICD-10-CM

## 2022-07-22 DIAGNOSIS — E1169 Type 2 diabetes mellitus with other specified complication: Secondary | ICD-10-CM

## 2022-07-22 DIAGNOSIS — I471 Supraventricular tachycardia, unspecified: Secondary | ICD-10-CM | POA: Diagnosis not present

## 2022-07-22 DIAGNOSIS — I5022 Chronic systolic (congestive) heart failure: Secondary | ICD-10-CM

## 2022-07-22 DIAGNOSIS — Z79899 Other long term (current) drug therapy: Secondary | ICD-10-CM | POA: Diagnosis not present

## 2022-07-22 DIAGNOSIS — Z950 Presence of cardiac pacemaker: Secondary | ICD-10-CM

## 2022-07-22 LAB — BAYER DCA HB A1C WAIVED: HB A1C (BAYER DCA - WAIVED): 5.8 % — ABNORMAL HIGH (ref 4.8–5.6)

## 2022-07-22 MED ORDER — METFORMIN HCL 500 MG PO TABS
500.0000 mg | ORAL_TABLET | Freq: Two times a day (BID) | ORAL | 3 refills | Status: DC
Start: 2022-07-22 — End: 2023-04-14

## 2022-07-22 MED ORDER — LORAZEPAM 0.5 MG PO TABS
0.5000 mg | ORAL_TABLET | Freq: Two times a day (BID) | ORAL | 5 refills | Status: DC | PRN
Start: 2022-07-22 — End: 2023-01-06

## 2022-07-22 MED ORDER — TRAZODONE HCL 50 MG PO TABS
50.0000 mg | ORAL_TABLET | Freq: Every evening | ORAL | 3 refills | Status: AC | PRN
Start: 2022-07-22 — End: ?

## 2022-07-22 NOTE — Progress Notes (Signed)
Established Patient Office Visit  Subjective   [name], female    DOB: 1950/12/16  Age: 72 y.o. MRN: [id number]  Chief Complaint  Patient presents with   Follow-up    DXA AND MAMMO DUE  Chronic follow up-Needs 30 minutes     Medical Management of Chronic Issues   Diabetes    HPI  DM Pt presents for follow up evaluation of Type 2 diabetes mellitus. Patient denies foot ulcerations, increased appetite, nausea, paresthesia of the feet, polydipsia, polyuria, visual disturbances, vomiting, and weight loss.  Current diabetic medications include: metformin 500 mg BID Compliant with meds - Yes  Current monitoring regimen: home blood tests - 2-5 times weekly Home blood sugar records: fasting range: 100s Any episodes of hypoglycemia? no  2. HTN Complaint with meds - Yes Current Medications - coreg, entresto Pertinent ROS:  Headache - No Fatigue - No Visual Disturbances - No Chest pain - No Dyspnea - No Palpitations - No LE edema - No  3.  Depression/anxiety Reports celexa has not been helping. She would like to stop taking this. She is due for refills of ativan today. She continues to have anxiety attacks with jitteriness regularly. She would also like a referral on trazodone as it has been helping with her sleep.      07/22/2022    8:59 AM 07/10/2022   12:08 PM 05/10/2022    1:01 PM  Depression screen PHQ 2/9  Decreased Interest 0 0 0  Down, Depressed, Hopeless 0 0 0  PHQ - 2 Score 0 0 0  Altered sleeping 0 0 0  Tired, decreased energy 0 0 0  Change in appetite 0 0 0  Feeling bad or failure about yourself  0 0 0  Trouble concentrating 0 0 0  Moving slowly or fidgety/restless 0 0 0  Suicidal thoughts 0 0 0  PHQ-9 Score 0 0 0  Difficult doing work/chores Not difficult at all Not difficult at all Not difficult at all      07/22/2022    9:00 AM 05/10/2022    1:01 PM 04/26/2022    8:03 AM 04/18/2022    8:20 AM  GAD 7 : Generalized Anxiety Score   Nervous, Anxious, on Edge 0 0 0 1  Control/stop worrying 0 0 0 0  Worry too much - different things 0 0 0 0  Trouble relaxing 0 0 0 0  Restless 0 0 0 0  Easily annoyed or irritable 0 0 1 0  Afraid - awful might happen 0 0 0 0  Total GAD 7 Score 0 0 1 1  Anxiety Difficulty Not difficult at all Not difficult at all Somewhat difficult Not difficult at all      Past Medical History:  Diagnosis Date   Asthma    AVNRT (AV nodal re-entry tachycardia)    Carotid artery disease (HCC)    Cirrhosis (HCC)    Diverticulosis of colon (without mention of hemorrhage)    Essential hypertension    Functional ovarian cysts    GERD (gastroesophageal reflux disease)    Hemorrhoids, external, without mention of complication    Hormone replacement therapy (postmenopausal)    IBS (irritable bowel syndrome)    Migraine    Mitral valve disease    Pacemaker    Complete heart block - Medtronic   Right knee DJD    Seizures (HCC)    Sleep apnea    TIA (transient ischemic attack)    Type  2 diabetes mellitus (HCC)       ROS As per HPI.    Objective:     BP 110/70   Pulse 72   Temp (!) 97.5 F (36.4 C) (Oral)   Ht 5\' 4"  (1.626 m)   Wt 153 lb (69.4 kg)   SpO2 99%   BMI 26.26 kg/m  Wt Readings from Last 3 Encounters:  07/22/22 153 lb (69.4 kg)  07/10/22 156 lb 3.2 oz (70.9 kg)  06/27/22 155 lb 6 oz (70.5 kg)      Physical Exam Vitals and nursing note reviewed.  Constitutional:      General: She is not in acute distress.    Appearance: Normal appearance. She is not ill-appearing, toxic-appearing or diaphoretic.  Cardiovascular:     Rate and Rhythm: Normal rate and regular rhythm.     Heart sounds: Murmur heard.     Systolic murmur is present with a grade of 2/6.  Pulmonary:     Effort: Pulmonary effort is normal. No respiratory distress.     Breath sounds: Normal breath sounds.  Abdominal:     General: Bowel sounds are normal. There is no distension.     Palpations: Abdomen is  soft.     Tenderness: There is no abdominal tenderness. There is no guarding or rebound.  Musculoskeletal:     Right lower leg: No edema.     Left lower leg: No edema.  Skin:    General: Skin is warm and dry.  Neurological:     Mental Status: She is alert and oriented to person, place, and time. Mental status is at baseline.  Psychiatric:        Mood and Affect: Mood normal.        Behavior: Behavior normal.        Thought Content: Thought content normal.        Judgment: Judgment normal.      No results found for any visits on 07/22/22.    The 10-year ASCVD risk score (Arnett DK, et al., 2019) is: 18.3%    Assessment & Plan:   Carol Richmond was seen today for follow-up, medical management of chronic issues and diabetes.  Diagnoses and all orders for this visit:  Type 2 diabetes mellitus with hyperglycemia, without long-term current use of insulin (HCC) A1c 5.8 today, at goal of <7. Medication changes today: none. She ACE/ARB and statin. Eye exam: reminded to schedule. Foot exam: UTD. Urine micro: UTD. Diet and exercise.  -     Bayer DCA Hb A1c Waived -     metFORMIN (GLUCOPHAGE) 500 MG tablet; Take 1 tablet (500 mg total) by mouth 2 (two) times daily with a meal.  Hypertension associated with type 2 diabetes mellitus (HCC) Well controlled on current regimen.   Hyperlipidemia associated with type 2 diabetes mellitus (HCC) Refused statin. Diet and exercise.   Chronic HFrEF (heart failure with reduced ejection fraction) (HCC) SVT (supraventricular tachycardia) S/P placement of cardiac pacemaker Managed by cardiology. Euvolemic. Denies symptoms.   Depression, recurrent (HCC) Generalized anxiety disorder Controlled substance agreement signed Discussed weaning off Celexa due to patient preference. Continue ativan. CSA signed today. UDS today.  -     ToxASSURE Select 13 (MW), Urine -     LORazepam (ATIVAN) 0.5 MG tablet; Take 1 tablet (0.5 mg total) by mouth 2 (two) times  daily as needed for anxiety.  Cirrhosis, nonalcoholic (HCC) Managed by GI. Stable.   Chronic insomnia Well controlled on current regimen.  -  traZODone (DESYREL) 50 MG tablet; Take 1 tablet (50 mg total) by mouth at bedtime as needed for sleep.   Return in about 3 months (around 10/22/2022) for chronic follow up.   The patient indicates understanding of these issues and agrees with the plan.  Gabriel Earing, FNP

## 2022-07-24 ENCOUNTER — Ambulatory Visit (INDEPENDENT_AMBULATORY_CARE_PROVIDER_SITE_OTHER): Payer: Medicare HMO | Admitting: *Deleted

## 2022-07-24 DIAGNOSIS — E1165 Type 2 diabetes mellitus with hyperglycemia: Secondary | ICD-10-CM

## 2022-07-24 DIAGNOSIS — I152 Hypertension secondary to endocrine disorders: Secondary | ICD-10-CM

## 2022-07-24 NOTE — Patient Instructions (Signed)
Please call the care guide team at [phone] if you need to cancel or reschedule your appointment.   If you are experiencing a Mental Health or Behavioral Health Crisis or need someone to talk to, please call the Suicide and Crisis Lifeline: 988 call the [place] National Suicide Prevention Lifeline: 786-332-6287 or TTY: 785-185-4326 TTY 251-871-4344) to talk to a trained counselor call 1-800-273-TALK (toll free, 24 hour hotline) go to [facility] [address], [place] [phone]) call the [facility]: [phone] call 911   Following is a copy of the CCM Program Consent:  CCM service includes personalized support from designated clinical staff supervised by the physician, including individualized plan of care and coordination with other care providers 24/7 contact phone numbers for assistance for urgent and routine care needs. Service will only be billed when office clinical staff spend 20 minutes or more in a month to coordinate care. Only one practitioner may furnish and bill the service in a calendar month. The patient may stop CCM services at amy time (effective at the end of the month) by phone call to the office staff. The patient will be responsible for cost sharing (co-pay) or up to 20% of the service fee (after annual deductible is met)  Following is a copy of your full provider care plan:   Goals Addressed             This Visit's Progress    CCM (DIABETES) EXPECTED OUTCOME:  MONITOR, SELF-MANAGE AND REDUCE SYMPTOMS OF DIABETES       Current Barriers:  Knowledge Deficits related to Diabetes management Chronic Disease Management support and education needs related to Diabetes and diet Patient reports she checks CBG every other day with fasting readings ranges continuing in 130' with recent readins 100 and 109. Patient is exercising, does not always follow a special diet. Patient saw ENT about issues with her  throat and will be having an ultrasound on 08/25/22 Patient reports she has not decided on whether she wants to see psychiatrist/ psychologist and "still thinking about it"  Planned Interventions: Reviewed medications with patient and discussed importance of medication adherence;        Counseled on importance of regular laboratory monitoring as prescribed;        Discussed plans with patient for ongoing care management follow up and provided patient with direct contact information for care management team;      Advised patient, providing education and rationale, to check cbg once daily and record        call provider for findings outside established parameters;       Review of patient status, including review of consultants reports, relevant laboratory and other test results, and medications completed;       Reviewed carbohydrate modified diet and nutritious food choices Reviewed upcoming scheduled appointments  Symptom Management: Take medications as prescribed   Attend all scheduled provider appointments Call pharmacy for medication refills 3-7 days in advance of running out of medications Attend church or other social activities Perform all self care activities independently  Perform IADL's (shopping, preparing meals, housekeeping, managing finances) independently Call provider office for new concerns or questions  check blood sugar at prescribed times: once daily check feet daily for cuts, sores or redness enter blood sugar readings and medication or insulin into daily log take the blood sugar log to all doctor visits take the blood sugar meter to all doctor visits trim toenails straight across drink 6 to 8  glasses of water each day eat fish at least once per week fill half of plate with vegetables limit fast food meals to no more than 1 per week manage portion size prepare main meal at home 3 to 5 days each week read food labels for fat, fiber, carbohydrates and portion size set  a realistic goal Be mindful of carbohydrate intake at each meal (too much bread, pasta, rice, potatoes at one meal with elevate your blood sugar) Referral for psychology sent to [facility]- [phone] if you decide to follow up  Follow Up Plan: Telephone follow up appointment with care management team member scheduled for: 10/11/22 at 3 pm       CCM (HYPERTENSION) EXPECTED OUTCOME: MONITOR, SELF-MANAGE AND REDUCE SYMPTOMS OF HYPERTENSION       Current Barriers:  Knowledge Deficits related to Hypertension management Chronic Disease Management support and education needs related to Hypertension, diet Patient reports she is widowed and lives alone, is independent with all aspects of her care, continues to drive, goes to the gym approximately 5 days per week, has adult daughter she can call on if needed, does not monitor blood pressure, is considering obtaining blood pressure cuff from [facility] and may start checking BP.  Pt reports she does not always follow a special diet. Patient reports she has all medication and taking as prescribed, no new concerns reported  Planned Interventions: Evaluation of current treatment plan related to hypertension self management and patient's adherence to plan as established by provider;   Reviewed medications with patient and discussed importance of compliance;  Counseled on the importance of exercise goals with target of 150 minutes per week Discussed plans with patient for ongoing care management follow up and provided patient with direct contact information for care management team; Advised patient, providing education and rationale, to monitor blood pressure daily and record, calling PCP for findings outside established parameters;  Advised patient to discuss any issues with blood pressure, medications with provider; Discussed complications of poorly controlled blood pressure such as heart disease, stroke, circulatory complications, vision complications, kidney  impairment, sexual dysfunction;  Reviewed low sodium diet and importance of reading food labels  Symptom Management: Take medications as prescribed   Attend all scheduled provider appointments Call pharmacy for medication refills 3-7 days in advance of running out of medications Attend church or other social activities Perform all self care activities independently  Perform IADL's (shopping, preparing meals, housekeeping, managing finances) independently Call provider office for new concerns or questions  check blood pressure weekly choose a place to take my blood pressure (home, clinic or office, retail store) write blood pressure results in a log or diary take blood pressure log to all doctor appointments keep all doctor appointments take medications for blood pressure exactly as prescribed begin an exercise program report new symptoms to your doctor eat more whole grains, fruits and vegetables, lean meats and healthy fats Continue working out at the gym- keep up the good work! Follow low sodium diet- read food labels for sodium content Limit fast food  Follow Up Plan: Telephone follow up appointment with care management team member scheduled for:  10/11/22 at 3 pm          Patient verbalizes understanding of instructions and care plan provided today and agrees to view in MyChart. Active MyChart status and patient understanding of how to access instructions and care plan via MyChart confirmed with patient.  Telephone follow up appointment with care management team member scheduled for:  10/11/22 at  3 pm

## 2022-07-24 NOTE — Chronic Care Management (AMB) (Signed)
Chronic Care Management   CCM RN Visit Note  07/24/2022 Name: Carol Richmond MRN: [id number] DOB: 07-01-1950  Subjective: Carol Richmond is a 72 y.o. year old female who is a primary care patient of Gabriel Earing, FNP. The patient was referred to the Chronic Care Management team for assistance with care management needs subsequent to provider initiation of CCM services and plan of care.    Today's Visit:  Engaged with patient by telephone for follow up visit.        Goals Addressed             This Visit's Progress    CCM (DIABETES) EXPECTED OUTCOME:  MONITOR, SELF-MANAGE AND REDUCE SYMPTOMS OF DIABETES       Current Barriers:  Knowledge Deficits related to Diabetes management Chronic Disease Management support and education needs related to Diabetes and diet Patient reports she checks CBG every other day with fasting readings ranges continuing in 130' with recent readins 100 and 109. Patient is exercising, does not always follow a special diet. Patient saw ENT about issues with her throat and will be having an ultrasound on 08/25/22 Patient reports she has not decided on whether she wants to see psychiatrist/ psychologist and "still thinking about it"  Planned Interventions: Reviewed medications with patient and discussed importance of medication adherence;        Counseled on importance of regular laboratory monitoring as prescribed;        Discussed plans with patient for ongoing care management follow up and provided patient with direct contact information for care management team;      Advised patient, providing education and rationale, to check cbg once daily and record        call provider for findings outside established parameters;       Review of patient status, including review of consultants reports, relevant laboratory and other test results, and medications completed;       Reviewed carbohydrate modified diet and nutritious food choices Reviewed upcoming  scheduled appointments  Symptom Management: Take medications as prescribed   Attend all scheduled provider appointments Call pharmacy for medication refills 3-7 days in advance of running out of medications Attend church or other social activities Perform all self care activities independently  Perform IADL's (shopping, preparing meals, housekeeping, managing finances) independently Call provider office for new concerns or questions  check blood sugar at prescribed times: once daily check feet daily for cuts, sores or redness enter blood sugar readings and medication or insulin into daily log take the blood sugar log to all doctor visits take the blood sugar meter to all doctor visits trim toenails straight across drink 6 to 8 glasses of water each day eat fish at least once per week fill half of plate with vegetables limit fast food meals to no more than 1 per week manage portion size prepare main meal at home 3 to 5 days each week read food labels for fat, fiber, carbohydrates and portion size set a realistic goal Be mindful of carbohydrate intake at each meal (too much bread, pasta, rice, potatoes at one meal with elevate your blood sugar) Referral for psychology sent to [facility]- [phone] if you decide to follow up  Follow Up Plan: Telephone follow up appointment with care management team member scheduled for: 10/11/22 at 3 pm       CCM (HYPERTENSION) EXPECTED OUTCOME: MONITOR, SELF-MANAGE AND REDUCE SYMPTOMS OF HYPERTENSION       Current Barriers:  Knowledge Deficits related  to Hypertension management Chronic Disease Management support and education needs related to Hypertension, diet Patient reports she is widowed and lives alone, is independent with all aspects of her care, continues to drive, goes to the gym approximately 5 days per week, has adult daughter she can call on if needed, does not monitor blood pressure, is considering obtaining blood pressure cuff from [facility]  and may start checking BP.  Pt reports she does not always follow a special diet. Patient reports she has all medication and taking as prescribed, no new concerns reported  Planned Interventions: Evaluation of current treatment plan related to hypertension self management and patient's adherence to plan as established by provider;   Reviewed medications with patient and discussed importance of compliance;  Counseled on the importance of exercise goals with target of 150 minutes per week Discussed plans with patient for ongoing care management follow up and provided patient with direct contact information for care management team; Advised patient, providing education and rationale, to monitor blood pressure daily and record, calling PCP for findings outside established parameters;  Advised patient to discuss any issues with blood pressure, medications with provider; Discussed complications of poorly controlled blood pressure such as heart disease, stroke, circulatory complications, vision complications, kidney impairment, sexual dysfunction;  Reviewed low sodium diet and importance of reading food labels  Symptom Management: Take medications as prescribed   Attend all scheduled provider appointments Call pharmacy for medication refills 3-7 days in advance of running out of medications Attend church or other social activities Perform all self care activities independently  Perform IADL's (shopping, preparing meals, housekeeping, managing finances) independently Call provider office for new concerns or questions  check blood pressure weekly choose a place to take my blood pressure (home, clinic or office, retail store) write blood pressure results in a log or diary take blood pressure log to all doctor appointments keep all doctor appointments take medications for blood pressure exactly as prescribed begin an exercise program report new symptoms to your doctor eat more whole grains, fruits  and vegetables, lean meats and healthy fats Continue working out at the gym- keep up the good work! Follow low sodium diet- read food labels for sodium content Limit fast food  Follow Up Plan: Telephone follow up appointment with care management team member scheduled for:  10/11/22 at 3 pm          Plan:Telephone follow up appointment with care management team member scheduled for:  10/11/22 at 3 pm  Irving Shows [facility], BSN RN Case Manager [facility] [phone]

## 2022-07-25 ENCOUNTER — Other Ambulatory Visit: Payer: Self-pay | Admitting: Family Medicine

## 2022-07-25 DIAGNOSIS — Z79899 Other long term (current) drug therapy: Secondary | ICD-10-CM

## 2022-07-25 DIAGNOSIS — F1320 Sedative, hypnotic or anxiolytic dependence, uncomplicated: Secondary | ICD-10-CM

## 2022-07-25 LAB — TOXASSURE SELECT 13 (MW), URINE

## 2022-07-26 ENCOUNTER — Telehealth: Payer: Self-pay | Admitting: Family Medicine

## 2022-07-26 ENCOUNTER — Ambulatory Visit (HOSPITAL_COMMUNITY)
Admission: RE | Admit: 2022-07-26 | Discharge: 2022-07-26 | Disposition: A | Discharge status: Home / Self Care | Payer: Medicare HMO | Source: Ambulatory Visit | Attending: Family Medicine | Admitting: Family Medicine

## 2022-07-26 DIAGNOSIS — R591 Generalized enlarged lymph nodes: Secondary | ICD-10-CM | POA: Diagnosis not present

## 2022-07-26 DIAGNOSIS — R590 Localized enlarged lymph nodes: Secondary | ICD-10-CM | POA: Diagnosis not present

## 2022-07-26 NOTE — Telephone Encounter (Signed)
Patient wanted an appt to see someone on Monday 6/10 to check her ears, offered multiple appts for that day and she was made aware that it would not be with her PCP. She stated that she needed to leave a urine and was made aware that she would need to make a lab appt for this because the orders were already in. Did not want to make appt at this time. Reminded of her appt for next Thursday 6/13 with PCP.   Wants someone to call her back to "clear this all up"

## 2022-07-26 NOTE — Telephone Encounter (Signed)
Returned patients call.    Refer to toxassure results.  Per results lorazepam was not in urine and she needed repeat toxassure.  Patient states that it was not in her urine since she had been out for atleast two weeks.  States she ran out when you were out on vacation.  She started taking it again Monday after the office visit.  Does patient need to come back in for another toxassure? Aware you are not in office today.

## 2022-07-29 ENCOUNTER — Other Ambulatory Visit: Payer: Self-pay | Admitting: Family Medicine

## 2022-07-29 NOTE — Progress Notes (Signed)
Patient r/ c °

## 2022-07-29 NOTE — Telephone Encounter (Signed)
I didn't realize she had been out. No need to repeat toxassure then.

## 2022-07-30 ENCOUNTER — Encounter: Payer: Self-pay | Admitting: Family

## 2022-07-30 ENCOUNTER — Telehealth: Payer: Self-pay | Admitting: Family Medicine

## 2022-07-30 ENCOUNTER — Other Ambulatory Visit: Payer: Self-pay

## 2022-07-30 ENCOUNTER — Ambulatory Visit (INDEPENDENT_AMBULATORY_CARE_PROVIDER_SITE_OTHER): Payer: Medicare HMO | Admitting: Family

## 2022-07-30 VITALS — BP 109/73 | HR 89 | Temp 97.0°F | Ht 64.0 in | Wt 151.4 lb

## 2022-07-30 DIAGNOSIS — Z79899 Other long term (current) drug therapy: Secondary | ICD-10-CM | POA: Diagnosis not present

## 2022-07-30 DIAGNOSIS — F1320 Sedative, hypnotic or anxiolytic dependence, uncomplicated: Secondary | ICD-10-CM | POA: Diagnosis not present

## 2022-07-30 DIAGNOSIS — J209 Acute bronchitis, unspecified: Secondary | ICD-10-CM | POA: Diagnosis not present

## 2022-07-30 MED ORDER — FLUTICASONE PROPIONATE 50 MCG/ACT NA SUSP
2.0000 | Freq: Every day | NASAL | 6 refills | Status: DC
Start: 2022-07-30 — End: 2023-06-24

## 2022-07-30 MED ORDER — PROMETHAZINE-DM 6.25-15 MG/5ML PO SYRP
5.0000 mL | ORAL_SOLUTION | Freq: Three times a day (TID) | ORAL | 0 refills | Status: DC | PRN
Start: 2022-07-30 — End: 2022-08-15

## 2022-07-30 MED ORDER — NYSTATIN 100000 UNIT/ML MT SUSP: 5.0000 mL | Freq: Four times a day (QID) | OROMUCOSAL | 0 refills | Status: DC

## 2022-07-30 MED ORDER — CETIRIZINE HCL 10 MG PO TABS
10.0000 mg | ORAL_TABLET | Freq: Every day | ORAL | 1 refills | Status: DC
Start: 2022-07-30 — End: 2022-10-17

## 2022-07-30 NOTE — Telephone Encounter (Signed)
Patient aware and verbalized understanding. °

## 2022-07-30 NOTE — Telephone Encounter (Signed)
Nystatin  Prescription sent to pharmacy   

## 2022-07-30 NOTE — Progress Notes (Signed)
Subjective:    [name], female    DOB: Dec 01, 1950, 72 y.o.   MRN: [id number]  Chief Complaint  Patient presents with   Cough    Taken mucenix and  robitussin runny nose since Saturday     PT presents to the office today with cough that started 4 days ago.  Cough This is a new problem. The current episode started in the past 7 days. The problem has been gradually worsening. The cough is Productive of sputum. Associated symptoms include ear congestion, headaches, nasal congestion and wheezing. Pertinent negatives include no chills, ear pain, fever, myalgias or shortness of breath. She has tried rest and OTC cough suppressant for the symptoms. The treatment provided mild relief.      Review of Systems  Constitutional:  Negative for chills and fever.  HENT:  Negative for ear pain.   Respiratory:  Positive for cough and wheezing. Negative for shortness of breath.   Musculoskeletal:  Negative for myalgias.  Neurological:  Positive for headaches.  All other systems reviewed and are negative.      Objective:   Physical Exam Vitals reviewed.  Constitutional:      General: She is not in acute distress.    Appearance: She is well-developed.  HENT:     Head: Normocephalic and atraumatic.     Right Ear: Tympanic membrane normal.     Left Ear: Tympanic membrane normal.  Eyes:     Pupils: Pupils are equal, round, and reactive to light.  Neck:     Thyroid: No thyromegaly.  Cardiovascular:     Rate and Rhythm: Normal rate and regular rhythm.     Heart sounds: Normal heart sounds. No murmur heard. Pulmonary:     Effort: Pulmonary effort is normal. No respiratory distress.     Breath sounds: Normal breath sounds. No wheezing.  Abdominal:     General: Bowel sounds are normal. There is no distension.     Palpations: Abdomen is soft.     Tenderness: There is no abdominal tenderness.  Musculoskeletal:        General: No tenderness. Normal range of motion.      Cervical back: Normal range of motion and neck supple.  Skin:    General: Skin is warm and dry.  Neurological:     Mental Status: She is alert and oriented to person, place, and time.     Cranial Nerves: No cranial nerve deficit.     Deep Tendon Reflexes: Reflexes are normal and symmetric.  Psychiatric:        Behavior: Behavior normal.        Thought Content: Thought content normal.        Judgment: Judgment normal.       BP 109/73   Pulse 89   Temp (!) 97 F (36.1 C) (Temporal)   Ht 5\' 4"  (1.626 m)   Wt 151 lb 6.4 oz (68.7 kg)   SpO2 96%   BMI 25.99 kg/m      Assessment & Plan:  Carol Richmond comes in today with chief complaint of Cough (Taken mucenix and  robitussin runny nose since Saturday  )   Diagnosis and orders addressed:  1. Acute bronchitis, unspecified organism Start zyrtec and flonase  Promethazine-DM to take at night, do not take with Ultram If symptoms are not improved called office and I will send in antibiotic  - Take meds as prescribed - Use a cool mist humidifier  -Use  saline nose sprays frequently -Force fluids -For any cough or congestion  Use plain Mucinex- regular strength or max strength is fine -For fever or aces or pains- take tylenol or ibuprofen. -Throat lozenges if help -Follow up if symptoms worsen or do not improve  - fluticasone (FLONASE) 50 MCG/ACT nasal spray; Place 2 sprays into both nostrils daily.  Dispense: 16 g; Refill: 6 - cetirizine (ZYRTEC ALLERGY) 10 MG tablet; Take 1 tablet (10 mg total) by mouth daily.  Dispense: 90 tablet; Refill: 1 - promethazine-dextromethorphan (PROMETHAZINE-DM) 6.25-15 MG/5ML syrup; Take 5 mLs by mouth 3 (three) times daily as needed for cough.  Dispense: 240 mL; Refill: 0   Jannifer Rodney, FNP

## 2022-07-30 NOTE — Patient Instructions (Signed)

## 2022-07-30 NOTE — Telephone Encounter (Signed)
PT needs to try taking zyrtec and flonase, If not better later on in week will send in antibiotic.

## 2022-07-31 ENCOUNTER — Telehealth: Payer: Self-pay | Admitting: Family Medicine

## 2022-07-31 NOTE — Progress Notes (Signed)
Patient r/ c °

## 2022-08-01 ENCOUNTER — Telehealth: Payer: Self-pay

## 2022-08-01 ENCOUNTER — Ambulatory Visit: Payer: Medicare HMO | Admitting: Nurse Practitioner

## 2022-08-01 ENCOUNTER — Ambulatory Visit: Payer: Medicare HMO | Admitting: Family Medicine

## 2022-08-01 LAB — TOXASSURE SELECT 13 (MW), URINE

## 2022-08-01 MED ORDER — AMOXICILLIN-POT CLAVULANATE 875-125 MG PO TABS: 1.0000 | ORAL_TABLET | Freq: Two times a day (BID) | ORAL | 0 refills | Status: DC

## 2022-08-01 NOTE — Progress Notes (Signed)
Patient calling back. Wants to discuss results

## 2022-08-01 NOTE — Telephone Encounter (Signed)
Augmentin Prescription sent to pharmacy, [place] results under Imaging.

## 2022-08-01 NOTE — Telephone Encounter (Signed)
Patient aware that medication was sent to pharmacy. Would like to have a nurse call back about [place] results.

## 2022-08-01 NOTE — Telephone Encounter (Signed)
Called pt back. Spoke w/pt and voiced understanding. Per pt will call next week Friday to try and set appt w/Tiffany, FNP

## 2022-08-02 ENCOUNTER — Other Ambulatory Visit: Payer: Self-pay | Admitting: *Deleted

## 2022-08-02 ENCOUNTER — Telehealth: Payer: Self-pay | Admitting: Family Medicine

## 2022-08-02 ENCOUNTER — Ambulatory Visit: Payer: Medicare HMO

## 2022-08-02 DIAGNOSIS — I6523 Occlusion and stenosis of bilateral carotid arteries: Secondary | ICD-10-CM

## 2022-08-02 DIAGNOSIS — E1165 Type 2 diabetes mellitus with hyperglycemia: Secondary | ICD-10-CM

## 2022-08-02 NOTE — Telephone Encounter (Signed)
Spoke with pt and advised to make sure she is eating a meal before taking the medication to help reduce diarrhea side effects.

## 2022-08-05 ENCOUNTER — Telehealth (INDEPENDENT_AMBULATORY_CARE_PROVIDER_SITE_OTHER): Payer: Self-pay | Admitting: *Deleted

## 2022-08-05 ENCOUNTER — Telehealth: Payer: Self-pay | Admitting: Family Medicine

## 2022-08-05 NOTE — Telephone Encounter (Signed)
Wants to know if she is supposed to still take Amoxicillin and has two other questions but don't know what they are and will think about them when she talks to the nurse.

## 2022-08-05 NOTE — Telephone Encounter (Signed)
Patient states she has been taking tylenol 325mg  one tid as needed for headaches. she went to pcp last week she states pcp told her not to take tylenol because it would mess up liver and for her to take ibuprofen. I let her know her last visit in office on 03/28/22 she was given instructions she can take tylenol max of 2 g per day.  She verbalized understanding.

## 2022-08-05 NOTE — Telephone Encounter (Signed)
Diarrhea is a common side effects of Amoxicillin. BRAT diet. If not feeling better needs follow up with PCP.

## 2022-08-05 NOTE — Telephone Encounter (Signed)
Patient aware and verbalized understanding. °

## 2022-08-05 NOTE — Addendum Note (Signed)
Addended by: Austin Miles F on: 08/05/2022 01:41 PM   Modules accepted: Orders

## 2022-08-07 ENCOUNTER — Ambulatory Visit (INDEPENDENT_AMBULATORY_CARE_PROVIDER_SITE_OTHER): Payer: Medicare HMO | Admitting: Family Medicine

## 2022-08-07 ENCOUNTER — Encounter: Payer: Self-pay | Admitting: Family Medicine

## 2022-08-07 VITALS — BP 97/67 | HR 135 | Temp 95.4°F | Ht 64.0 in | Wt 152.6 lb

## 2022-08-07 DIAGNOSIS — Z712 Person consulting for explanation of examination or test findings: Secondary | ICD-10-CM | POA: Diagnosis not present

## 2022-08-07 NOTE — Patient Instructions (Addendum)
Follow up with primary care provider for next appointment  Goal BP:  For patients younger than 60: Goal BP < 140/90. For patients 60 and older: Goal BP < 150/90. For patients with diabetes: Goal BP < 140/90.  Take your medications faithfully as prescribed. Maintain a healthy weight. Get at least 150 minutes of aerobic exercise per week. Minimize salt intake, less than 2000 mg per day. Minimize alcohol intake.  DASH Eating Plan DASH stands for "Dietary Approaches to Stop Hypertension." The DASH eating plan is a healthy eating plan that has been shown to reduce high blood pressure (hypertension). Additional health benefits may include reducing the risk of type 2 diabetes mellitus, heart disease, and stroke. The DASH eating plan may also help with weight loss.  WHAT DO I NEED TO KNOW ABOUT THE DASH EATING PLAN? For the DASH eating plan, you will follow these general guidelines: Choose foods with a percent daily value for sodium of less than 5% (as listed on the food label). Use salt-free seasonings or herbs instead of table salt or sea salt. Check with your health care provider or pharmacist before using salt substitutes. Eat lower-sodium products, often labeled as "lower sodium" or "no salt added." Eat fresh foods. Eat more vegetables, fruits, and low-fat dairy products. Choose whole grains. Look for the word "whole" as the first word in the ingredient list. Choose fish and skinless chicken or [place] more often than red meat. Limit fish, poultry, and meat to 6 oz (170 g) each day. Limit sweets, desserts, sugars, and sugary drinks. Choose heart-healthy fats. Limit cheese to 1 oz (28 g) per day. Eat more home-cooked food and less restaurant, buffet, and fast food. Limit fried foods. Cook foods using methods other than frying. Limit canned vegetables. If you do use them, rinse them well to decrease the sodium. When eating at a restaurant, ask that your food be prepared with less salt, or  no salt if possible.  WHAT FOODS CAN I EAT? Seek help from a dietitian for individual calorie needs.  Grains Whole grain or whole wheat bread. Brown rice. Whole grain or whole wheat pasta. Quinoa, bulgur, and whole grain cereals. Low-sodium cereals. Corn or whole wheat flour tortillas. Whole grain cornbread. Whole grain crackers. Low-sodium crackers.  Vegetables Fresh or frozen vegetables (raw, steamed, roasted, or grilled). Low-sodium or reduced-sodium tomato and vegetable juices. Low-sodium or reduced-sodium tomato sauce and paste. Low-sodium or reduced-sodium canned vegetables.   Fruits All fresh, canned (in natural juice), or frozen fruits.  Meat and Other Protein Products Ground beef (85% or leaner), grass-fed beef, or beef trimmed of fat. Skinless chicken or [place]. Ground chicken or [place]. Pork trimmed of fat. All fish and seafood. Eggs. Dried beans, peas, or lentils. Unsalted nuts and seeds. Unsalted canned beans.  Dairy Low-fat dairy products, such as skim or 1% milk, 2% or reduced-fat cheeses, low-fat ricotta or cottage cheese, or plain low-fat yogurt. Low-sodium or reduced-sodium cheeses.  Fats and Oils Tub margarines without trans fats. Light or reduced-fat mayonnaise and salad dressings (reduced sodium). Avocado. Safflower, olive, or canola oils. Natural peanut or almond butter.  Other Unsalted popcorn and pretzels. The items listed above may not be a complete list of recommended foods or beverages. Contact your dietitian for more options.  WHAT FOODS ARE NOT RECOMMENDED?  Grains White bread. White pasta. White rice. Refined cornbread. Bagels and croissants. Crackers that contain trans fat.  Vegetables Creamed or fried vegetables. Vegetables in a cheese sauce. Regular canned vegetables. Regular canned  tomato sauce and paste. Regular tomato and vegetable juices.  Fruits Dried fruits. Canned fruit in light or heavy syrup. Fruit juice.  Meat and Other Protein  Products Fatty cuts of meat. Ribs, chicken wings, bacon, sausage, bologna, salami, chitterlings, fatback, hot dogs, bratwurst, and packaged luncheon meats. Salted nuts and seeds. Canned beans with salt.  Dairy Whole or 2% milk, cream, half-and-half, and cream cheese. Whole-fat or sweetened yogurt. Full-fat cheeses or blue cheese. Nondairy creamers and whipped toppings. Processed cheese, cheese spreads, or cheese curds.  Condiments Onion and garlic salt, seasoned salt, table salt, and sea salt. Canned and packaged gravies. Worcestershire sauce. Tartar sauce. Barbecue sauce. Teriyaki sauce. Soy sauce, including reduced sodium. Steak sauce. Fish sauce. Oyster sauce. Cocktail sauce. Horseradish. Ketchup and mustard. Meat flavorings and tenderizers. Bouillon cubes. Hot sauce. Tabasco sauce. Marinades. Taco seasonings. Relishes.  Fats and Oils Butter, stick margarine, lard, shortening, ghee, and bacon fat. Coconut, palm kernel, or palm oils. Regular salad dressings.  Other Pickles and olives. Salted popcorn and pretzels.  The items listed above may not be a complete list of foods and beverages to avoid. Contact your dietitian for more information.  WHERE CAN I FIND MORE INFORMATION? [employer], [employer], [employer]: [employer] Document Released: 01/24/2011 Document Revised: 06/21/2013 Document Reviewed: 12/09/2012 [facility] Patient Information 2015 [place], [place]. This information is not intended to replace advice given to you by your health care provider. Make sure you discuss any questions you have with your health care provider.  I think that you would greatly benefit from seeing a nutritionist.  If you are interested, please call Dr. Gerilyn Pilgrim at [phone] to schedule an appointment.

## 2022-08-07 NOTE — Progress Notes (Signed)
Subjective:  [name], female    DOB: February 11, 1951, 72 y.o.   MRN: [id number]  Patient Care Team: Gabriel Earing, FNP as PCP - General (Family Medicine) Jonelle Sidle, MD as PCP - Cardiology (Cardiology) Marinus Maw, MD as PCP - Electrophysiology (Cardiology) Van Clines, MD as Consulting Physician (Neurology) Malissa Hippo, MD (Inactive) as Consulting Physician (Gastroenterology) Dr Desiree Lucy Optometrist, [place], OD (Optometry) Chuck Hint, MD as Consulting Physician (Vascular Surgery) Danella Maiers, [facility] as Pharmacist (Family Medicine) Audrie Gallus, RN as [facility] Care Management Sharlene Dory, NP as Nurse Practitioner (Cardiology)   Chief Complaint:  Results ([place] results )   HPI: Carol Richmond is a 72 y.o. female presenting on 08/07/2022 for Results ([place] results )   Pt present to discuss recent [place] results. Went over [place] results in detail with pt.     Relevant past medical, surgical, family, and social history reviewed and updated as indicated.  Allergies and medications reviewed and updated. Data reviewed: Chart in Epic.   Past Medical History:  Diagnosis Date   Asthma    AVNRT (AV nodal re-entry tachycardia)    Carotid artery disease (HCC)    Cirrhosis (HCC)    Diverticulosis of colon (without mention of hemorrhage)    Essential hypertension    Functional ovarian cysts    GERD (gastroesophageal reflux disease)    Hemorrhoids, external, without mention of complication    Hormone replacement therapy (postmenopausal)    IBS (irritable bowel syndrome)    Migraine    Mitral valve disease    Pacemaker    Complete heart block - Medtronic   Right knee DJD    Seizures (HCC)    Sleep apnea    TIA (transient ischemic attack)    Type 2 diabetes mellitus (HCC)     Past Surgical History:  Procedure Laterality Date   BIOPSY  09/23/2018   Procedure: BIOPSY;  Surgeon: Malissa Hippo, MD;   [facility];  Service: Endoscopy;;  duodeneum and stomach   CHOLECYSTECTOMY  1980   ENDARTERECTOMY Right 07/01/2017   Procedure: ENDARTERECTOMY CAROTID RIGHT;  Surgeon: Nada Libman, MD;  Location: [facility] OR;  Service: Vascular;  Laterality: Right;   ESOPHAGOGASTRODUODENOSCOPY N/A 09/23/2018   Procedure: ESOPHAGOGASTRODUODENOSCOPY (EGD);  Surgeon: Malissa Hippo, MD;  Location: [facility];  Service: Endoscopy;  Laterality: N/A;  2:45-moved to 1:45 per Dewayne Hatch   ESOPHAGOGASTRODUODENOSCOPY (EGD) WITH PROPOFOL N/A 03/07/2021   Procedure: ESOPHAGOGASTRODUODENOSCOPY (EGD) WITH PROPOFOL;  Surgeon: Malissa Hippo, MD;  [facility];  Service: Endoscopy;  Laterality: N/A;  12:50   LEFT HEART CATHETERIZATION WITH CORONARY ANGIOGRAM N/A 11/18/2013   Procedure: LEFT HEART CATHETERIZATION WITH CORONARY ANGIOGRAM;  Surgeon: Kathleene Hazel, MD;  Location: [facility] CATH LAB;  Service: Cardiovascular;  Laterality: N/A;   OOPHORECTOMY     2012 Dr. Donzetta Matters in [place]   PACEMAKER IMPLANT N/A 12/14/2021   Procedure: PACEMAKER IMPLANT;  Surgeon: Marinus Maw, MD;  Location: [facility] INVASIVE CV LAB;  Service: Cardiovascular;  Laterality: N/A;   PATCH ANGIOPLASTY Right 07/01/2017   Procedure: RIGHT CAROTID PATCH ANGIOPLASTY;  Surgeon: Nada Libman, MD;  Location: [facility] OR;  Service: Vascular;  Laterality: Right;   SVT ABLATION N/A 12/14/2021   Procedure: SVT ABLATION;  Surgeon: Marinus Maw, MD;  Location: [facility] INVASIVE CV LAB;  Service: Cardiovascular;  Laterality: N/A;   TEE WITHOUT CARDIOVERSION N/A 03/01/2022  Procedure: TRANSESOPHAGEAL ECHOCARDIOGRAM (TEE);  Surgeon: Thurmon Fair, MD;  Location: [facility] ENDOSCOPY;  Service: Cardiovascular;  Laterality: N/A;   TONSILLECTOMY     TOTAL KNEE ARTHROPLASTY Right 09/02/2012   Procedure: TOTAL KNEE ARTHROPLASTY- right;  Surgeon: Loreta Ave, MD;  Location: [facility] OR;  Service: Orthopedics;  Laterality: Right;   TUBAL LIGATION     TYMPANOMASTOIDECTOMY  Left 04/02/2016   Procedure: LEFT CANAL WALL DOWN TYMPANOMASTOIDECTOMY;  Surgeon: Newman Pies, MD;  Location: [facility];  Service: ENT;  Laterality: Left;   VAGINAL HYSTERECTOMY  1990   WOUND EXPLORATION Right 07/02/2017   Procedure: RIGHT NECK EXPLORATION WITH INTRAOPERATIVE ULTRASOUND;  Surgeon: Chuck Hint, MD;  Location: [facility] OR;  Service: Vascular;  Laterality: Right;    Social History   Socioeconomic History   Marital status: Widowed    Spouse name: Not on file   Number of children: 3   Years of education: Not on file   Highest education level: Not on file  Occupational History   Occupation: retired  Tobacco Use   Smoking status: Never   Smokeless tobacco: Never  Vaping Use   Vaping Use: Never used  Substance and Sexual Activity   Alcohol use: No    Alcohol/week: 0.0 standard drinks of alcohol   Drug use: No   Sexual activity: Not Currently  Other Topics Concern   Not on file  Social History Narrative   Lives alone. Granddaughter lives across the street, her 3 children all live nearby   Social Determinants of Health   Financial Resource Strain: Low Risk  (01/04/2022)   Overall Financial Resource Strain (CARDIA)    Difficulty of Paying Living Expenses: Not very hard  Food Insecurity: No Food Insecurity (01/04/2022)   Hunger Vital Sign    Worried About Running Out of Food in the Last Year: Never true    Ran Out of Food in the Last Year: Never true  Transportation Needs: No Transportation Needs (01/04/2022)   PRAPARE - Administrator, Civil Service (Medical): No    Lack of Transportation (Non-Medical): No  Physical Activity: Sufficiently Active (01/04/2022)   Exercise Vital Sign    Days of Exercise per Week: 5 days    Minutes of Exercise per Session: 30 min  Stress: No Stress Concern Present (01/04/2022)   [employer] of Occupational Health - Occupational Stress Questionnaire    Feeling of Stress : Not at all  Social  Connections: Moderately Integrated (01/04/2022)   Social Connection and Isolation Panel [NHANES]    Frequency of Communication with Friends and Family: More than three times a week    Frequency of Social Gatherings with Friends and Family: More than three times a week    Attends Religious Services: More than 4 times per year    Active Member of [employer] or Organizations: Yes    Attends Banker Meetings: More than 4 times per year    Marital Status: Widowed  Intimate Partner Violence: Not At Risk (07/05/2021)   Humiliation, Afraid, Rape, and Kick questionnaire    Fear of Current or Ex-Partner: No    Emotionally Abused: No    Physically Abused: No    Sexually Abused: No    Outpatient Encounter Medications as of 08/07/2022  Medication Sig   Accu-Chek Softclix Lancets lancets check blood sugars twice daily DX E11.65   acetaminophen (TYLENOL) 500 MG tablet Take 500 mg by mouth every 6 (six) hours as needed for mild pain.  amoxicillin-clavulanate (AUGMENTIN) 875-125 MG tablet Take 1 tablet by mouth 2 (two) times daily.   aspirin EC 81 MG tablet Take 1 tablet (81 mg total) by mouth daily.   carvedilol (COREG) 3.125 MG tablet TAKE (1) TABLET TWICE DAILY.   cetirizine (ZYRTEC ALLERGY) 10 MG tablet Take 1 tablet (10 mg total) by mouth daily.   clotrimazole (MYCELEX) 10 MG troche Allow one to dissolve in the mouth 5 times daily For yeast   fluticasone (FLONASE) 50 MCG/ACT nasal spray Place 2 sprays into both nostrils daily.   furosemide (LASIX) 40 MG tablet TAKE 1 TABLET ONCE DAILY.   gabapentin (NEURONTIN) 600 MG tablet Take 600 mg by mouth 3 (three) times daily.   glucose blood (ACCU-CHEK GUIDE) test strip Use as instructed to test blood sugar daily as directed. DX:E11.9   lactulose (CHRONULAC) 10 GM/15ML solution TAKE 30 MLS (20 GRAM TOTAL) BY MOUTH 3 TIMES A DAY.   lamoTRIgine (LAMICTAL) 200 MG tablet TAKE (1) TABLET TWICE DAILY.   levocetirizine (XYZAL) 5 MG tablet Take 1 tablet (5  mg total) by mouth every evening.   LORazepam (ATIVAN) 0.5 MG tablet Take 1 tablet (0.5 mg total) by mouth 2 (two) times daily as needed for anxiety.   magic mouthwash (lidocaine, diphenhydrAMINE, alum & mag hydroxide) suspension swish and swallow 5 mls 4 times daily as needed for mouth pain   metFORMIN (GLUCOPHAGE) 500 MG tablet Take 1 tablet (500 mg total) by mouth 2 (two) times daily with a meal.   Multiple Vitamin (MULTIVITAMIN) capsule Take 1 capsule by mouth daily.   naloxone (NARCAN) nasal spray 4 mg/0.1 mL Place 1 spray into the nose once. Take by nasal route every 3 minutes until patient awakes or EMS arrives.   nystatin (MYCOSTATIN) 100000 UNIT/ML suspension Take 5 mLs (500,000 Units total) by mouth 4 (four) times daily.   ondansetron (ZOFRAN) 4 MG tablet TAKE 1 TABLET BY MOUTH EVERY 8 HOURS AS NEEDED FOR NAUSEA AND VOMITING.   pantoprazole (PROTONIX) 40 MG tablet TAKE (1) TABLET TWICE DAILY.   potassium chloride SA (KLOR-CON M) 20 MEQ tablet take 2 tablets by mouth daily.   promethazine-dextromethorphan (PROMETHAZINE-DM) 6.25-15 MG/5ML syrup Take 5 mLs by mouth 3 (three) times daily as needed for cough.   sacubitril-valsartan (ENTRESTO) 24-26 MG Take 1 tablet by mouth 2 (two) times daily.   traMADol (ULTRAM) 50 MG tablet Take 50 mg by mouth daily as needed for severe pain or moderate pain.   traZODone (DESYREL) 50 MG tablet Take 1 tablet (50 mg total) by mouth at bedtime as needed for sleep.   No facility-administered encounter medications on file as of 08/07/2022.    No Known Allergies  Review of Systems  Constitutional: Negative.   HENT:  Positive for sore throat.   Eyes: Negative.   Respiratory: Negative.    Cardiovascular: Negative.   Gastrointestinal: Negative.   Endocrine: Negative.   Genitourinary: Negative.   Musculoskeletal: Negative.   Allergic/Immunologic: Positive for environmental allergies.  Neurological: Negative.   Hematological:  Positive for adenopathy.   Psychiatric/Behavioral: Negative.    All other systems reviewed and are negative.       Objective:  BP 97/67   Pulse (!) 135   Temp (!) 95.4 F (35.2 C)   Ht 5\' 4"  (1.626 m)   Wt 152 lb 9.6 oz (69.2 kg)   SpO2 97%   BMI 26.19 kg/m    Wt Readings from Last 3 Encounters:  08/07/22 152 lb 9.6 oz (69.2  kg)  07/30/22 151 lb 6.4 oz (68.7 kg)  07/22/22 153 lb (69.4 kg)    Physical Exam Vitals and nursing note reviewed.  Constitutional:      Appearance: Normal appearance. She is obese.  HENT:     Head: Normocephalic and atraumatic.     Right Ear: Tympanic membrane, ear canal and external ear normal.     Left Ear: Tympanic membrane, ear canal and external ear normal.     Nose: Nose normal. No congestion or rhinorrhea.     Mouth/Throat:     Mouth: Mucous membranes are moist.     Pharynx: No oropharyngeal exudate or posterior oropharyngeal erythema.  Eyes:     Extraocular Movements: Extraocular movements intact.     Conjunctiva/sclera: Conjunctivae normal.     Pupils: Pupils are equal, round, and reactive to light.  Cardiovascular:     Rate and Rhythm: Normal rate and regular rhythm.     Pulses: Normal pulses.     Heart sounds: Normal heart sounds.  Pulmonary:     Effort: Pulmonary effort is normal. No respiratory distress.     Breath sounds: Normal breath sounds. No stridor. No wheezing, rhonchi or rales.  Abdominal:     General: Abdomen is flat.     Palpations: Abdomen is soft.  Musculoskeletal:        General: Normal range of motion.     Cervical back: Normal range of motion.  Skin:    General: Skin is warm.     Capillary Refill: Capillary refill takes less than 2 seconds.  Neurological:     General: No focal deficit present.     Mental Status: She is alert.  Psychiatric:        Mood and Affect: Mood normal.        Behavior: Behavior normal.        Thought Content: Thought content normal.        Judgment: Judgment normal.     Results for orders placed or  performed in visit on 07/30/22  ToxASSURE Select 13 (MW), Urine  Result Value Ref Range   Summary Note        Pertinent labs & imaging results that were available during my care of the patient were reviewed by me and considered in my medical decision making.  Assessment & Plan:  Ginia was seen today for results.  Diagnoses and all orders for this visit:  Encounter to discuss test results Verbalized understanding of results.     Continue all other maintenance medications.  Follow up plan: Return to clinic if health conditions worsen.   Follow up with primary care provider  Continue healthy lifestyle choices, including diet (rich in fruits, vegetables, and lean proteins, and low in salt and simple carbohydrates) and exercise (at least 30 minutes of moderate physical activity daily).  Educational handout given for BP goals and DASH diet  The above assessment and management plan was discussed with the patient. The patient verbalized understanding of and has agreed to the management plan. Patient is aware to call the clinic if they develop any new symptoms or if symptoms persist or worsen. Patient is aware when to return to the clinic for a follow-up visit. Patient educated on when it is appropriate to go to the emergency department.   Maryelizabeth Kaufmann NP Student [facility] [phone]  I personally was present during the history, physical exam, and medical decision-making activities of this visit and have verified that the services and findings  are accurately documented in the nurse practitioner student's note.  Kari Baars, FNP-C [facility] [address] [place], [place] [address] [phone]

## 2022-08-09 ENCOUNTER — Telehealth: Payer: Self-pay

## 2022-08-09 ENCOUNTER — Telehealth: Payer: Self-pay | Admitting: Cardiology

## 2022-08-09 MED ORDER — OMRON 3 SERIES BP MONITOR DEVI: 0 refills | Status: AC

## 2022-08-09 NOTE — Telephone Encounter (Signed)
LPN asked to take a message. Please return call ASAP.

## 2022-08-09 NOTE — Telephone Encounter (Signed)
Patient states throat is doing a little better. She will hold off until her September appt unless condition worsens

## 2022-08-09 NOTE — Telephone Encounter (Signed)
Patient notified and verbalized understanding.  Will send script for BP cuff to [facility] now.

## 2022-08-09 NOTE — Telephone Encounter (Signed)
Spoke with patient - c/o SOB the last 2 days, she contributes this to the heat.  No other c/o chest pain or tachycardia / racing heart beat.  Does not have way to check BP / HR at home.  Please see not sent to you from device clinic as well.  Moved her appointment up to 08/26/22 from 08/30/2022.

## 2022-08-09 NOTE — Telephone Encounter (Signed)
Spoke with patient informed her of new onset AF, informed it put her at risk for a stroke, patient agreeable to apt with AF clinic on Monday 08/12/22 at 3:30  to discuss OAC.

## 2022-08-09 NOTE — Telephone Encounter (Signed)
  Device Alert: Avg. Ventricular Rate >= 130 bpm during AT/AF (>= 2 hr) for 1 days.  AT/AF >= 0.5 hr for 5 days. Presenting rhythm AF/AFL, poor rate control, ongoing from 6/20 @ 13:44 No hx of PAF noted, ASA only per EPIC  route to triage NSVT events show SVT  LVM for patient to call device clinic back.

## 2022-08-09 NOTE — Telephone Encounter (Signed)
Pt c/o Shortness Of Breath: STAT if SOB developed within the last 24 hours or pt is noticeably SOB on the phone  1. Are you currently SOB (can you hear that pt is SOB on the phone)?  No   2. How long have you been experiencing SOB?  Started yesterday  3. Are you SOB when sitting or when up moving around?  When up and moving around   4. Are you currently experiencing any other symptoms?  No

## 2022-08-11 ENCOUNTER — Emergency Department (HOSPITAL_COMMUNITY): Payer: Medicare HMO

## 2022-08-11 ENCOUNTER — Encounter (HOSPITAL_COMMUNITY): Payer: Self-pay | Admitting: *Deleted

## 2022-08-11 ENCOUNTER — Other Ambulatory Visit: Payer: Self-pay

## 2022-08-11 ENCOUNTER — Inpatient Hospital Stay (HOSPITAL_COMMUNITY)
Admission: EM | Admit: 2022-08-11 | Discharge: 2022-08-15 | DRG: 309 | Disposition: A | Discharge status: Home / Self Care | Payer: Medicare HMO | Attending: Internal Medicine | Admitting: Internal Medicine

## 2022-08-11 DIAGNOSIS — J45909 Unspecified asthma, uncomplicated: Secondary | ICD-10-CM | POA: Diagnosis not present

## 2022-08-11 DIAGNOSIS — D649 Anemia, unspecified: Secondary | ICD-10-CM | POA: Diagnosis not present

## 2022-08-11 DIAGNOSIS — I4819 Other persistent atrial fibrillation: Principal | ICD-10-CM | POA: Diagnosis present

## 2022-08-11 DIAGNOSIS — Z7901 Long term (current) use of anticoagulants: Secondary | ICD-10-CM | POA: Diagnosis not present

## 2022-08-11 DIAGNOSIS — E1140 Type 2 diabetes mellitus with diabetic neuropathy, unspecified: Secondary | ICD-10-CM | POA: Diagnosis not present

## 2022-08-11 DIAGNOSIS — K219 Gastro-esophageal reflux disease without esophagitis: Secondary | ICD-10-CM | POA: Diagnosis present

## 2022-08-11 DIAGNOSIS — Z79890 Hormone replacement therapy (postmenopausal): Secondary | ICD-10-CM | POA: Diagnosis not present

## 2022-08-11 DIAGNOSIS — I4892 Unspecified atrial flutter: Secondary | ICD-10-CM | POA: Diagnosis not present

## 2022-08-11 DIAGNOSIS — G4733 Obstructive sleep apnea (adult) (pediatric): Secondary | ICD-10-CM | POA: Diagnosis present

## 2022-08-11 DIAGNOSIS — Z833 Family history of diabetes mellitus: Secondary | ICD-10-CM | POA: Diagnosis not present

## 2022-08-11 DIAGNOSIS — Z7982 Long term (current) use of aspirin: Secondary | ICD-10-CM | POA: Diagnosis not present

## 2022-08-11 DIAGNOSIS — Z7984 Long term (current) use of oral hypoglycemic drugs: Secondary | ICD-10-CM

## 2022-08-11 DIAGNOSIS — K589 Irritable bowel syndrome without diarrhea: Secondary | ICD-10-CM | POA: Diagnosis present

## 2022-08-11 DIAGNOSIS — I5022 Chronic systolic (congestive) heart failure: Secondary | ICD-10-CM | POA: Diagnosis present

## 2022-08-11 DIAGNOSIS — B001 Herpesviral vesicular dermatitis: Secondary | ICD-10-CM | POA: Diagnosis not present

## 2022-08-11 DIAGNOSIS — I110 Hypertensive heart disease with heart failure: Secondary | ICD-10-CM | POA: Diagnosis present

## 2022-08-11 DIAGNOSIS — Z8673 Personal history of transient ischemic attack (TIA), and cerebral infarction without residual deficits: Secondary | ICD-10-CM | POA: Diagnosis not present

## 2022-08-11 DIAGNOSIS — E860 Dehydration: Secondary | ICD-10-CM | POA: Diagnosis present

## 2022-08-11 DIAGNOSIS — Z96651 Presence of right artificial knee joint: Secondary | ICD-10-CM | POA: Diagnosis present

## 2022-08-11 DIAGNOSIS — R0989 Other specified symptoms and signs involving the circulatory and respiratory systems: Secondary | ICD-10-CM | POA: Diagnosis not present

## 2022-08-11 DIAGNOSIS — F418 Other specified anxiety disorders: Secondary | ICD-10-CM | POA: Diagnosis not present

## 2022-08-11 DIAGNOSIS — Z950 Presence of cardiac pacemaker: Secondary | ICD-10-CM | POA: Diagnosis not present

## 2022-08-11 DIAGNOSIS — I739 Peripheral vascular disease, unspecified: Secondary | ICD-10-CM | POA: Diagnosis not present

## 2022-08-11 DIAGNOSIS — Z8249 Family history of ischemic heart disease and other diseases of the circulatory system: Secondary | ICD-10-CM

## 2022-08-11 DIAGNOSIS — I471 Supraventricular tachycardia, unspecified: Secondary | ICD-10-CM | POA: Diagnosis not present

## 2022-08-11 DIAGNOSIS — I4719 Other supraventricular tachycardia: Secondary | ICD-10-CM | POA: Diagnosis present

## 2022-08-11 DIAGNOSIS — K7460 Unspecified cirrhosis of liver: Secondary | ICD-10-CM | POA: Diagnosis present

## 2022-08-11 DIAGNOSIS — I050 Rheumatic mitral stenosis: Secondary | ICD-10-CM | POA: Diagnosis not present

## 2022-08-11 DIAGNOSIS — I442 Atrioventricular block, complete: Secondary | ICD-10-CM | POA: Diagnosis present

## 2022-08-11 DIAGNOSIS — I342 Nonrheumatic mitral (valve) stenosis: Secondary | ICD-10-CM | POA: Diagnosis not present

## 2022-08-11 DIAGNOSIS — I10 Essential (primary) hypertension: Secondary | ICD-10-CM

## 2022-08-11 DIAGNOSIS — I340 Nonrheumatic mitral (valve) insufficiency: Secondary | ICD-10-CM | POA: Diagnosis not present

## 2022-08-11 DIAGNOSIS — I4891 Unspecified atrial fibrillation: Secondary | ICD-10-CM | POA: Diagnosis not present

## 2022-08-11 DIAGNOSIS — E1151 Type 2 diabetes mellitus with diabetic peripheral angiopathy without gangrene: Secondary | ICD-10-CM | POA: Diagnosis not present

## 2022-08-11 DIAGNOSIS — Z79899 Other long term (current) drug therapy: Secondary | ICD-10-CM

## 2022-08-11 DIAGNOSIS — R9431 Abnormal electrocardiogram [ECG] [EKG]: Secondary | ICD-10-CM | POA: Diagnosis not present

## 2022-08-11 DIAGNOSIS — I509 Heart failure, unspecified: Secondary | ICD-10-CM | POA: Diagnosis not present

## 2022-08-11 DIAGNOSIS — R0602 Shortness of breath: Secondary | ICD-10-CM | POA: Diagnosis not present

## 2022-08-11 LAB — CBC WITH DIFFERENTIAL/PLATELET
Abs Immature Granulocytes: 0.03 10*3/uL (ref 0.00–0.07)
Basophils Absolute: 0 10*3/uL (ref 0.0–0.1)
Basophils Relative: 0 %
Eosinophils Absolute: 0.1 10*3/uL (ref 0.0–0.5)
Eosinophils Relative: 2 %
HCT: 34.2 % — ABNORMAL LOW (ref 36.0–46.0)
Hemoglobin: 11.9 g/dL — ABNORMAL LOW (ref 12.0–15.0)
Immature Granulocytes: 0 %
Lymphocytes Relative: 18 %
Lymphs Abs: 1.6 10*3/uL (ref 0.7–4.0)
MCH: 31.6 pg (ref 26.0–34.0)
MCHC: 34.8 g/dL (ref 30.0–36.0)
MCV: 90.7 fL (ref 80.0–100.0)
Monocytes Absolute: 1.2 10*3/uL — ABNORMAL HIGH (ref 0.1–1.0)
Monocytes Relative: 13 %
Neutro Abs: 6 10*3/uL (ref 1.7–7.7)
Neutrophils Relative %: 67 %
Platelets: 224 10*3/uL (ref 150–400)
RBC: 3.77 MIL/uL — ABNORMAL LOW (ref 3.87–5.11)
RDW: 13 % (ref 11.5–15.5)
WBC: 8.9 10*3/uL (ref 4.0–10.5)
nRBC: 0 % (ref 0.0–0.2)

## 2022-08-11 LAB — MAGNESIUM: Magnesium: 2.1 mg/dL (ref 1.7–2.4)

## 2022-08-11 LAB — CBG MONITORING, ED: Glucose-Capillary: 135 mg/dL — ABNORMAL HIGH (ref 70–99)

## 2022-08-11 LAB — COMPREHENSIVE METABOLIC PANEL
ALT: 16 U/L (ref 0–44)
AST: 27 U/L (ref 15–41)
Albumin: 3.4 g/dL — ABNORMAL LOW (ref 3.5–5.0)
Alkaline Phosphatase: 64 U/L (ref 38–126)
Anion gap: 11 (ref 5–15)
BUN: 34 mg/dL — ABNORMAL HIGH (ref 8–23)
CO2: 20 mmol/L — ABNORMAL LOW (ref 22–32)
Calcium: 9.4 mg/dL (ref 8.9–10.3)
Chloride: 97 mmol/L — ABNORMAL LOW (ref 98–111)
Creatinine, Ser: 1.32 mg/dL — ABNORMAL HIGH (ref 0.44–1.00)
GFR, Estimated: 43 mL/min — ABNORMAL LOW (ref >60)
Glucose, Bld: 197 mg/dL — ABNORMAL HIGH (ref 70–99)
Potassium: 5.4 mmol/L — ABNORMAL HIGH (ref 3.5–5.1)
Sodium: 128 mmol/L — ABNORMAL LOW (ref 135–145)
Total Bilirubin: 0.9 mg/dL (ref 0.3–1.2)
Total Protein: 7.1 g/dL (ref 6.5–8.1)

## 2022-08-11 LAB — BRAIN NATRIURETIC PEPTIDE: B Natriuretic Peptide: 1029 pg/mL — ABNORMAL HIGH (ref 0.0–100.0)

## 2022-08-11 LAB — GLUCOSE, CAPILLARY: Glucose-Capillary: 142 mg/dL — ABNORMAL HIGH (ref 70–99)

## 2022-08-11 MED ORDER — AMIODARONE HCL IN DEXTROSE 360-4.14 MG/200ML-% IV SOLN
60.0000 mg/h | INTRAVENOUS | Status: AC
  Filled 2022-08-11 (×2): qty 200

## 2022-08-11 MED ORDER — ONDANSETRON HCL 4 MG/2ML IJ SOLN
4.0000 mg | Freq: Four times a day (QID) | INTRAMUSCULAR | Status: DC | PRN
  Administered 2022-08-13 – 2022-08-14 (×2): 4 mg via INTRAVENOUS
  Filled 2022-08-11 (×2): qty 2

## 2022-08-11 MED ORDER — TRAZODONE HCL 50 MG PO TABS
50.0000 mg | ORAL_TABLET | Freq: Every evening | ORAL | Status: DC | PRN
  Administered 2022-08-12 – 2022-08-14 (×3): 50 mg via ORAL
  Filled 2022-08-11 (×3): qty 1

## 2022-08-11 MED ORDER — LACTULOSE 10 GM/15ML PO SOLN
20.0000 g | Freq: Three times a day (TID) | ORAL | Status: DC
  Administered 2022-08-11 – 2022-08-15 (×9): 20 g via ORAL
  Filled 2022-08-11 (×10): qty 30

## 2022-08-11 MED ORDER — METOPROLOL TARTRATE 12.5 MG HALF TABLET
12.5000 mg | ORAL_TABLET | Freq: Two times a day (BID) | ORAL | Status: DC
  Administered 2022-08-11 – 2022-08-15 (×8): 12.5 mg via ORAL
  Filled 2022-08-11 (×8): qty 1

## 2022-08-11 MED ORDER — HEPARIN (PORCINE) 25000 UT/250ML-% IV SOLN
1150.0000 [IU]/h | INTRAVENOUS | Status: DC
  Administered 2022-08-11: 1000 [IU]/h via INTRAVENOUS
  Administered 2022-08-12 – 2022-08-15 (×4): 1150 [IU]/h via INTRAVENOUS
  Filled 2022-08-11 (×4): qty 250

## 2022-08-11 MED ORDER — AMIODARONE HCL IN DEXTROSE 360-4.14 MG/200ML-% IV SOLN
60.0000 mg/h | INTRAVENOUS | Status: DC
  Administered 2022-08-11 (×2): 60 mg/h via INTRAVENOUS
  Filled 2022-08-11: qty 200

## 2022-08-11 MED ORDER — GABAPENTIN 300 MG PO CAPS: 300.0000 mg | ORAL_CAPSULE | Freq: Three times a day (TID) | ORAL | Status: DC | PRN

## 2022-08-11 MED ORDER — ACETAMINOPHEN 325 MG PO TABS
650.0000 mg | ORAL_TABLET | ORAL | Status: DC | PRN
  Administered 2022-08-13 – 2022-08-15 (×2): 650 mg via ORAL
  Filled 2022-08-11 (×2): qty 2

## 2022-08-11 MED ORDER — HEPARIN BOLUS VIA INFUSION
3000.0000 [IU] | Freq: Once | INTRAVENOUS | Status: AC
  Administered 2022-08-11: 3000 [IU] via INTRAVENOUS
  Filled 2022-08-11: qty 3000

## 2022-08-11 MED ORDER — ASPIRIN 81 MG PO TBEC
81.0000 mg | DELAYED_RELEASE_TABLET | Freq: Every day | ORAL | Status: DC
  Administered 2022-08-12 – 2022-08-15 (×4): 81 mg via ORAL
  Filled 2022-08-11 (×4): qty 1

## 2022-08-11 MED ORDER — AMIODARONE HCL IN DEXTROSE 360-4.14 MG/200ML-% IV SOLN
30.0000 mg/h | INTRAVENOUS | Status: DC
  Administered 2022-08-12 – 2022-08-14 (×4): 30 mg/h via INTRAVENOUS
  Filled 2022-08-11 (×3): qty 200

## 2022-08-11 MED ORDER — MELATONIN 5 MG PO TABS
5.0000 mg | ORAL_TABLET | Freq: Every day | ORAL | Status: DC
  Administered 2022-08-11 – 2022-08-14 (×4): 5 mg via ORAL
  Filled 2022-08-11 (×4): qty 1

## 2022-08-11 MED ORDER — AMIODARONE HCL IN DEXTROSE 360-4.14 MG/200ML-% IV SOLN
30.0000 mg/h | INTRAVENOUS | Status: DC
  Filled 2022-08-11: qty 200

## 2022-08-11 MED ORDER — PANTOPRAZOLE SODIUM 40 MG PO TBEC
40.0000 mg | DELAYED_RELEASE_TABLET | Freq: Two times a day (BID) | ORAL | Status: DC
  Administered 2022-08-11 – 2022-08-15 (×8): 40 mg via ORAL
  Filled 2022-08-11 (×8): qty 1

## 2022-08-11 MED ORDER — LAMOTRIGINE 100 MG PO TABS
200.0000 mg | ORAL_TABLET | Freq: Two times a day (BID) | ORAL | Status: DC
  Administered 2022-08-11 – 2022-08-15 (×8): 200 mg via ORAL
  Filled 2022-08-11 (×8): qty 2

## 2022-08-11 NOTE — ED Notes (Signed)
Cbg 135 

## 2022-08-11 NOTE — Progress Notes (Signed)
ANTICOAGULATION CONSULT NOTE - Initial Consult  Pharmacy Consult for IV heparin Indication: atrial fibrillation  No Known Allergies  Patient Measurements: Height: 5\' 4"  (162.6 cm) Weight: 69.9 kg (154 lb 1.6 oz) IBW/kg (Calculated) : 54.7 Heparin Dosing Weight: 68.8 kg  Vital Signs: Temp: 98.6 F (37 C) (06/23 1924) Temp Source: Oral (06/23 1924) BP: 92/74 (06/23 2000) Pulse Rate: 146 (06/23 2000)  Labs: Recent Labs    08/11/22 1206  HGB 11.9*  HCT 34.2*  PLT 224  CREATININE 1.32*    Estimated Creatinine Clearance: 38.1 mL/min (A) (by C-G formula based on SCr of 1.32 mg/dL (H)).   Medical History: Past Medical History:  Diagnosis Date   Asthma    AVNRT (AV nodal re-entry tachycardia)    Carotid artery disease (HCC)    Cirrhosis (HCC)    Diverticulosis of colon (without mention of hemorrhage)    Essential hypertension    Functional ovarian cysts    GERD (gastroesophageal reflux disease)    Hemorrhoids, external, without mention of complication    Hormone replacement therapy (postmenopausal)    IBS (irritable bowel syndrome)    Migraine    Mitral valve disease    Pacemaker    Complete heart block - Medtronic   Right knee DJD    Seizures (HCC)    Sleep apnea    TIA (transient ischemic attack)    Type 2 diabetes mellitus (HCC)     Medications:  Scheduled:   [START ON 08/12/2022] aspirin EC  81 mg Oral Daily   lactulose  20 g Oral TID   lamoTRIgine  200 mg Oral BID   melatonin  5 mg Oral QHS   metoprolol tartrate  12.5 mg Oral BID   pantoprazole  40 mg Oral BID    Assessment: 72 yo female presented w/ shortness of breath. PMH includes HF and pacemaker placement.; has new diagnosis of Afib and was to be seen in Afib Clinic on 6/24. No anticoagulation PTA. CHADS-VASc = 5. Pharmacy consulted to dose IV heparin.   Goal of Therapy:  Heparin level 0.3-0.7 units/ml Monitor platelets by anticoagulation protocol: Yes   Plan:  Bolus heparin 3000 units IV  x1 Start heparin drip at 1000 units/hr Heparin level in 8 hours Daily heparin level and CBC F/u transition to oral [facility]  Rexford Maus, PharmD, BCPS 08/11/2022 9:41 PM

## 2022-08-11 NOTE — ED Notes (Signed)
[facility] has been called for transport 

## 2022-08-11 NOTE — ED Notes (Signed)
[facility] at bedside for transport to [facility] 

## 2022-08-11 NOTE — Progress Notes (Signed)
Pt. With Red MEW d/t HR 140s, Resp 22.  On call for Cardiology paged to make aware.

## 2022-08-11 NOTE — ED Triage Notes (Addendum)
Pt with c/o SOB since Thursday and went away, SOB again on Friday.  Denies any CP.  Denies hx of COPD.  Pt has a pacemaker (Medtronic) and family member states she was called and an alert recently, has an appt with doctor tomorrow.

## 2022-08-11 NOTE — Plan of Care (Signed)
  Problem: Safety: Goal: Ability to remain free from injury will improve Outcome: Progressing   Problem: Education: Goal: Knowledge of disease or condition will improve Outcome: Progressing   

## 2022-08-11 NOTE — ED Notes (Signed)
Dinner tray given to pt

## 2022-08-11 NOTE — ED Provider Notes (Signed)
[place] EMERGENCY DEPARTMENT AT [facility] Provider Note   CSN: [id number] Arrival date & time: 08/11/22  1112     History  Chief Complaint  Patient presents with   Shortness of Breath    Carol Richmond is a 72 y.o. female.  HPI Patient presents with her daughter who assists with the history. Patient has a history of cardiac disease, no history of A-fib, a flutter.  However, the patient does have a pacemaker. She presents today with dyspnea, 4 days, worsening. No fever, chills, actual chest pain, syncope. She has scheduled follow-up with cardiology tomorrow, but with worsening dyspnea she presents for evaluation.    Home Medications Prior to Admission medications   Medication Sig Start Date End Date Taking? Authorizing Provider  Accu-Chek Softclix Lancets lancets check blood sugars twice daily DX E11.65 11/28/21   Gabriel Earing, FNP  acetaminophen (TYLENOL) 500 MG tablet Take 500 mg by mouth every 6 (six) hours as needed for mild pain.    [provider]  amoxicillin-clavulanate (AUGMENTIN) 875-125 MG tablet Take 1 tablet by mouth 2 (two) times daily. 08/01/22   Jannifer Rodney A, FNP  aspirin EC 81 MG tablet Take 1 tablet (81 mg total) by mouth daily. 09/24/18   Malissa Hippo, MD  Blood Pressure Monitoring (OMRON 3 SERIES BP MONITOR) DEVI Use as directed 08/09/22   Sharlene Dory, NP  carvedilol (COREG) 3.125 MG tablet TAKE (1) TABLET TWICE DAILY. 07/02/22   Jonelle Sidle, MD  cetirizine (ZYRTEC ALLERGY) 10 MG tablet Take 1 tablet (10 mg total) by mouth daily. 07/30/22   Junie Spencer, FNP  clotrimazole (MYCELEX) 10 MG troche Allow one to dissolve in the mouth 5 times daily For yeast 05/06/22   Mechele Claude, MD  fluticasone [facility]) 50 MCG/ACT nasal spray Place 2 sprays into both nostrils daily. 07/30/22   Junie Spencer, FNP  furosemide (LASIX) 40 MG tablet TAKE 1 TABLET ONCE DAILY. 06/10/22   Jonelle Sidle, MD  gabapentin  (NEURONTIN) 600 MG tablet Take 600 mg by mouth 3 (three) times daily. 01/24/20   [provider]  glucose blood (ACCU-CHEK GUIDE) test strip Use as instructed to test blood sugar daily as directed. DX:E11.9 12/05/21   Gabriel Earing, FNP  lactulose (CHRONULAC) 10 GM/15ML solution TAKE 30 MLS (20 GRAM TOTAL) BY MOUTH 3 TIMES A DAY. 05/30/22   Marguerita Merles, Reuel Boom, MD  lamoTRIgine (LAMICTAL) 200 MG tablet TAKE (1) TABLET TWICE DAILY. 01/21/22   Gabriel Earing, FNP  levocetirizine (XYZAL) 5 MG tablet Take 1 tablet (5 mg total) by mouth every evening. 06/27/22   Gabriel Earing, FNP  LORazepam (ATIVAN) 0.5 MG tablet Take 1 tablet (0.5 mg total) by mouth 2 (two) times daily as needed for anxiety. 07/22/22   Gabriel Earing, FNP  magic mouthwash (lidocaine, diphenhydrAMINE, alum & mag hydroxide) suspension swish and swallow 5 mls 4 times daily as needed for mouth pain 06/12/22   Marguerita Merles, Reuel Boom, MD  metFORMIN (GLUCOPHAGE) 500 MG tablet Take 1 tablet (500 mg total) by mouth 2 (two) times daily with a meal. 07/22/22   Gabriel Earing, FNP  Multiple Vitamin (MULTIVITAMIN) capsule Take 1 capsule by mouth daily.    [provider]  naloxone [facility]) nasal spray 4 mg/0.1 mL Place 1 spray into the nose once. Take by nasal route every 3 minutes until patient awakes or EMS arrives. 07/19/20   [provider]  nystatin (MYCOSTATIN) 100000 UNIT/ML  suspension Take 5 mLs (500,000 Units total) by mouth 4 (four) times daily. 07/30/22   Jannifer Rodney A, FNP  ondansetron (ZOFRAN) 4 MG tablet TAKE 1 TABLET BY MOUTH EVERY 8 HOURS AS NEEDED FOR NAUSEA AND VOMITING. 06/20/22   Marguerita Merles, Reuel Boom, MD  pantoprazole (PROTONIX) 40 MG tablet TAKE (1) TABLET TWICE DAILY. 06/10/22   Carlan, Chelsea L, NP  potassium chloride SA (KLOR-CON M) 20 MEQ tablet take 2 tablets by mouth daily. 07/29/22   Gabriel Earing, FNP  promethazine-dextromethorphan (PROMETHAZINE-DM) 6.25-15 MG/5ML syrup Take  5 mLs by mouth 3 (three) times daily as needed for cough. 07/30/22   Hawks, Neysa Bonito A, FNP  sacubitril-valsartan (ENTRESTO) 24-26 MG Take 1 tablet by mouth 2 (two) times daily. 02/04/22   Jonelle Sidle, MD  traMADol (ULTRAM) 50 MG tablet Take 50 mg by mouth daily as needed for severe pain or moderate pain. 02/19/22   [provider]  traZODone (DESYREL) 50 MG tablet Take 1 tablet (50 mg total) by mouth at bedtime as needed for sleep. 07/22/22   Gabriel Earing, FNP      Allergies    Patient has no known allergies.    Review of Systems   Review of Systems  All other systems reviewed and are negative.   Physical Exam Updated Vital Signs BP 98/79   Pulse (!) 144   Temp 98.2 F (36.8 C) (Oral)   Resp (!) 23   Ht 5\' 4"  (1.626 m)   Wt 68.9 kg   SpO2 96%   BMI 26.09 kg/m  Physical Exam Vitals and nursing note reviewed.  Constitutional:      General: She is not in acute distress.    Appearance: She is well-developed.  HENT:     Head: Normocephalic and atraumatic.  Eyes:     Conjunctiva/sclera: Conjunctivae normal.  Cardiovascular:     Rate and Rhythm: Tachycardia present.  Pulmonary:     Effort: Pulmonary effort is normal. No respiratory distress.     Breath sounds: Normal breath sounds. No stridor.  Abdominal:     General: There is no distension.  Musculoskeletal:     Right lower leg: Edema present.     Left lower leg: Edema present.  Skin:    General: Skin is warm and dry.  Neurological:     Mental Status: She is alert and oriented to person, place, and time.     Cranial Nerves: No cranial nerve deficit.  Psychiatric:        Mood and Affect: Mood normal.     ED Results / Procedures / Treatments   Labs (all labs ordered are listed, but only abnormal results are displayed) Labs Reviewed  COMPREHENSIVE METABOLIC PANEL - Abnormal; Notable for the following components:      Result Value   Sodium 128 (*)    Potassium 5.4 (*)    Chloride 97 (*)    CO2 20  (*)    Glucose, Bld 197 (*)    BUN 34 (*)    Creatinine, Ser 1.32 (*)    Albumin 3.4 (*)    GFR, Estimated 43 (*)    All other components within normal limits  CBC WITH DIFFERENTIAL/PLATELET - Abnormal; Notable for the following components:   RBC 3.77 (*)    Hemoglobin 11.9 (*)    HCT 34.2 (*)    Monocytes Absolute 1.2 (*)    All other components within normal limits  BRAIN NATRIURETIC PEPTIDE - Abnormal; Notable for the  following components:   B Natriuretic Peptide 1,029.0 (*)    All other components within normal limits  MAGNESIUM    EKG EKG Interpretation  Date/Time:  Sunday August 11 2022 11:43:51 EDT Ventricular Rate:  141 PR Interval:    QRS Duration: 149 QT Interval:  334 QTC Calculation: 512 R Axis:   235 Text Interpretation: A-fib or a flutter Confirmed by Gerhard Munch [phone]) on 08/11/2022 11:57:31 AM  Radiology DG Chest Portable 1 View  Result Date: 08/11/2022 CLINICAL DATA:  Shortness of breath for several days EXAM: PORTABLE CHEST 1 VIEW COMPARISON:  12/15/2021 FINDINGS: Cardiac shadow is mildly prominent but stable from the prior exam. Pacing device is again seen. Lungs are well aerated bilaterally. No focal infiltrate is noted. Mild central vascular congestion is seen without edema. No bony abnormality is noted. IMPRESSION: Mild vascular congestion without significant edema. Electronically Signed   By: Alcide Clever M.D.   On: 08/11/2022 11:56    Procedures Procedures    Medications Ordered in ED Medications  amiodarone (NEXTERONE PREMIX) 360-4.14 MG/200ML-% (1.8 mg/mL) IV infusion (has no administration in time range)    Followed by  amiodarone (NEXTERONE PREMIX) 360-4.14 MG/200ML-% (1.8 mg/mL) IV infusion (has no administration in time range)    ED Course/ Medical Decision Making/ A&P                             Medical Decision Making Adult female presents with daughter who assists with the history.  She presents with concern for dyspnea.  Notably  patient has a history of heart failure, and this is a consideration, arrhythmia is an additional consideration as initial vital signs notable for tachycardia. On reviewing the patient's chart, her EP notes suggest new A-fib and she is scheduled for clinic follow-up tomorrow, though she is unaware of the actual diagnosis. With concern for new A-fib, symptomatic for least 4 days she is not a candidate for cardioversion, did receive labs, cardiology consult, initiation of medication. Cardiac 140 A-fib abnormal Pulse ox 99% room air normal   Amount and/or Complexity of Data Reviewed Independent Historian: caregiver External Data Reviewed: notes.    Details: As above Labs: ordered. Decision-making details documented in ED Course. Radiology: ordered and independent interpretation performed. Decision-making details documented in ED Course. ECG/medicine tests: ordered and independent interpretation performed. Decision-making details documented in ED Course.  Risk Prescription drug management. Decision regarding hospitalization. Diagnosis or treatment significantly limited by social determinants of health.   1:58 PM Patient is a condition, she remains in A-fib.  I discussed her case with Dr. Rennis Golden, our cardiology colleague. Patient with prior ablation for SVT, now with A-fib, will receive amiodarone, given concern for this, mild hypotension, with a MAP of 85, will require transfer to our affiliated advanced care center. Patient, daughter, both aware of need.  As above, given the duration of symptoms she is not a candidate for electrical cardioversion.         Final Clinical Impression(s) / ED Diagnoses Final diagnoses:  New onset atrial fibrillation (HCC)   CRITICAL CARE Performed by: Gerhard Munch Total critical care time: 40 minutes Critical care time was exclusive of separately billable procedures and treating other patients. Critical care was necessary to treat or prevent  imminent or life-threatening deterioration. Critical care was time spent personally by me on the following activities: development of treatment plan with patient and/or surrogate as well as nursing, discussions with consultants, evaluation of patient's  response to treatment, examination of patient, obtaining history from patient or surrogate, ordering and performing treatments and interventions, ordering and review of laboratory studies, ordering and review of radiographic studies, pulse oximetry and re-evaluation of patient's condition.    Gerhard Munch, MD 08/11/22 1359

## 2022-08-11 NOTE — ED Notes (Signed)
MD made aware of pts HR of 140

## 2022-08-11 NOTE — ED Notes (Signed)
Per MD, pt can have something to drink per pts request for water, pt given water

## 2022-08-11 NOTE — ED Notes (Addendum)
MD made aware of pts ongoing HR in the 140s on amiodarone drip--MD states will assess pt progress

## 2022-08-12 ENCOUNTER — Ambulatory Visit (HOSPITAL_COMMUNITY): Payer: Medicare HMO | Admitting: Internal Medicine

## 2022-08-12 ENCOUNTER — Ambulatory Visit: Payer: Medicare HMO

## 2022-08-12 ENCOUNTER — Ambulatory Visit (HOSPITAL_COMMUNITY): Payer: Medicare HMO

## 2022-08-12 ENCOUNTER — Telehealth: Payer: Self-pay | Admitting: Internal Medicine

## 2022-08-12 DIAGNOSIS — I4892 Unspecified atrial flutter: Secondary | ICD-10-CM

## 2022-08-12 DIAGNOSIS — I471 Supraventricular tachycardia, unspecified: Secondary | ICD-10-CM

## 2022-08-12 DIAGNOSIS — B001 Herpesviral vesicular dermatitis: Secondary | ICD-10-CM

## 2022-08-12 LAB — PROTIME-INR
INR: 1.1 (ref 0.8–1.2)
Prothrombin Time: 14.4 seconds (ref 11.4–15.2)

## 2022-08-12 LAB — COMPREHENSIVE METABOLIC PANEL
ALT: 16 U/L (ref 0–44)
AST: 26 U/L (ref 15–41)
Albumin: 3.2 g/dL — ABNORMAL LOW (ref 3.5–5.0)
Alkaline Phosphatase: 61 U/L (ref 38–126)
Anion gap: 11 (ref 5–15)
BUN: 27 mg/dL — ABNORMAL HIGH (ref 8–23)
CO2: 20 mmol/L — ABNORMAL LOW (ref 22–32)
Calcium: 9.1 mg/dL (ref 8.9–10.3)
Chloride: 101 mmol/L (ref 98–111)
Creatinine, Ser: 1.23 mg/dL — ABNORMAL HIGH (ref 0.44–1.00)
GFR, Estimated: 47 mL/min — ABNORMAL LOW (ref >60)
Glucose, Bld: 153 mg/dL — ABNORMAL HIGH (ref 70–99)
Potassium: 4.5 mmol/L (ref 3.5–5.1)
Sodium: 132 mmol/L — ABNORMAL LOW (ref 135–145)
Total Bilirubin: 0.8 mg/dL (ref 0.3–1.2)
Total Protein: 6.5 g/dL (ref 6.5–8.1)

## 2022-08-12 LAB — CBC
HCT: 35.2 % — ABNORMAL LOW (ref 36.0–46.0)
Hemoglobin: 12 g/dL (ref 12.0–15.0)
MCH: 30.4 pg (ref 26.0–34.0)
MCHC: 34.1 g/dL (ref 30.0–36.0)
MCV: 89.1 fL (ref 80.0–100.0)
Platelets: 233 10*3/uL (ref 150–400)
RBC: 3.95 MIL/uL (ref 3.87–5.11)
RDW: 13 % (ref 11.5–15.5)
WBC: 9.5 10*3/uL (ref 4.0–10.5)
nRBC: 0 % (ref 0.0–0.2)

## 2022-08-12 LAB — GLUCOSE, CAPILLARY
Glucose-Capillary: 148 mg/dL — ABNORMAL HIGH (ref 70–99)
Glucose-Capillary: 198 mg/dL — ABNORMAL HIGH (ref 70–99)
Glucose-Capillary: 111 mg/dL — ABNORMAL HIGH (ref 70–99)
Glucose-Capillary: 152 mg/dL — ABNORMAL HIGH (ref 70–99)

## 2022-08-12 LAB — HEPARIN LEVEL (UNFRACTIONATED)
Heparin Unfractionated: 0.26 IU/mL — ABNORMAL LOW (ref 0.30–0.70)
Heparin Unfractionated: 0.42 IU/mL (ref 0.30–0.70)

## 2022-08-12 LAB — MAGNESIUM: Magnesium: 1.8 mg/dL (ref 1.7–2.4)

## 2022-08-12 LAB — BRAIN NATRIURETIC PEPTIDE: B Natriuretic Peptide: 950.5 pg/mL — ABNORMAL HIGH (ref 0.0–100.0)

## 2022-08-12 LAB — TSH: TSH: 1.245 u[IU]/mL (ref 0.350–4.500)

## 2022-08-12 LAB — T4, FREE: Free T4: 1.64 ng/dL — ABNORMAL HIGH (ref 0.61–1.12)

## 2022-08-12 MED ORDER — ACYCLOVIR 5 % EX OINT
TOPICAL_OINTMENT | Freq: Every day | CUTANEOUS | Status: DC
  Administered 2022-08-12 – 2022-08-14 (×6): 1 via TOPICAL
  Filled 2022-08-12: qty 15

## 2022-08-12 MED ORDER — WARFARIN SODIUM 7.5 MG PO TABS
7.5000 mg | ORAL_TABLET | Freq: Once | ORAL | Status: AC
  Administered 2022-08-12: 7.5 mg via ORAL
  Filled 2022-08-12: qty 1

## 2022-08-12 MED ORDER — HEPARIN BOLUS VIA INFUSION
1000.0000 [IU] | Freq: Once | INTRAVENOUS | Status: AC
  Administered 2022-08-12: 1000 [IU] via INTRAVENOUS
  Filled 2022-08-12: qty 1000

## 2022-08-12 MED ORDER — WARFARIN - PHARMACIST DOSING INPATIENT: Freq: Every day | Status: DC

## 2022-08-12 NOTE — Telephone Encounter (Signed)
Patient's daughter would like for Dr. Ladona Ridgel or nurse to give her a call asap about patient and hospital stay

## 2022-08-12 NOTE — Progress Notes (Signed)
   Case discussed with Dr. Jeraldine Loots (ER) yesterday - transferred for regular tachycardia (consider atrial tach vs atrial flutter). Has been started on amiodarone, low dose metoprolol and IV Heparin, given history of rheumatic mitral stenosis. May transition to warfarin. Suspect she will need TEE/DCCV this admission if she does not spontaneously convert. No plans for procedure today, so can resume diet. Will ask pharmacy to start warfarin. Discussed with the patient at bedside. She says she's feels better - pointed out some vesicles under her right nostril, ?HSV-1. Add topical acyclovir ointment.  Chrystie Nose, MD, [facility], FACP  [place]  [facility]  Medical Director of the Advanced Lipid Disorders &  Cardiovascular Risk Reduction Clinic Diplomate of the American Board of Clinical Lipidology Attending Cardiologist  Direct Dial: [phone]  Fax: [phone]  Website:  [email or web address]

## 2022-08-12 NOTE — Telephone Encounter (Signed)
Returned call and spoke with patient's daughter Selena Batten (OK per [facility]).  Kim states patient has been out of rhythm with her heart rates "jumping up" to the 140's. She is currently at [facility] receiving IV antiarrhythmic and blood thinner. She was transferred from [facility]   Selena Batten states it was mentioned that patient may need cardioversion if medication does not convert her. She is concerned because she lives an hour away and works and wishes to be present when they do perform cardioversion. Kim voiced dissatisfaction with communication from staff at [facility]. She would like to know if Dr. Ladona Ridgel could help with the communication lines so the patient and family could know in advance about patient having DCCV.  Informed patient Dr. Ladona Ridgel is in clinic today and that she really needs to discuss with hospital MD/staff. Discussed that perhaps during an evening round later today they may have more of an update on how to proceed with possible DCCV.  Kim verbalized understanding and requests a note be sent to Dr. Ladona Ridgel in case he is able to assist in any way with communication to family regarding when to expected possible DCCV.  Will forward to Dr. Ladona Ridgel to review.

## 2022-08-12 NOTE — Plan of Care (Signed)
?  Problem: Clinical Measurements: ?Goal: Respiratory complications will improve ?Outcome: Progressing ?Goal: Cardiovascular complication will be avoided ?Outcome: Progressing ?  ?Problem: Safety: ?Goal: Ability to remain free from injury will improve ?Outcome: Progressing ?  ?

## 2022-08-12 NOTE — Progress Notes (Addendum)
ANTICOAGULATION CONSULT NOTE - Initial Consult  Pharmacy Consult for IV heparin Indication: atrial fibrillation  No Known Allergies  Patient Measurements: Height: 5\' 4"  (162.6 cm) Weight: 71.2 kg (156 lb 15.5 oz) IBW/kg (Calculated) : 54.7 Heparin Dosing Weight: 68.8 kg  Vital Signs: Temp: 98.3 F (36.8 C) (06/24 0339) Temp Source: Oral (06/24 0339) BP: 102/64 (06/24 0756) Pulse Rate: 100 (06/24 0756)  Labs: Recent Labs    08/11/22 1206 08/12/22 0730  HGB 11.9* 12.0  HCT 34.2* 35.2*  PLT 224 233  HEPARINUNFRC  --  0.26*  CREATININE 1.32*  --     Assessment: 72 yo female presented w/ shortness of breath. PMH includes HF, mitral stenosis, and pacemaker placement.; has new diagnosis of Afib and was to be seen in [facility] Clinic on 6/24. No anticoagulation PTA. CHADS-VASc = 5. Pharmacy consulted to dose IV heparin.   HL 0.26- subtherapeutic  -no issue with line/infusion and no s/sx of bleeding per RN CBC WNL, stable  Goal of Therapy:  Heparin level 0.3-0.7 units/ml Monitor platelets by anticoagulation protocol: Yes   Plan:  Bolus 1000 units IV heparin x1 then  Increase heparin drip to 1150 units/hr Heparin level in 8 hours Daily heparin level and CBC F/u transition to oral [facility]  ----  Pharmacy consulted to add on warfarin therapy for rheumatic mitral stenosis + atrial tach vs atrial flutter. Plan for TEE and cardioversion w/ cardiology.   Noted patient is on amiodarone infusion currently.   Goal INR 2.0-3.0 INR this AM 1.1 -subtherapeutic   Continue heparin infusion until warfarin therapeutic  Warfarin 7.5 mg PO x1 today F/u daily INR, CBC   Calton Dach, PharmD Clinical Pharmacist 08/12/2022 11:04 AM

## 2022-08-12 NOTE — H&P (Signed)
Cardiology Admission History and Physical   Patient ID: [name] MRN: [id number]; DOB: 08/05/1950   Admission date: 08/11/2022  PCP:  Gabriel Earing, FNP   [facility] Providers Cardiologist:  Nona Dell, MD  Cardiology APP:  Sharlene Dory, NP  Electrophysiologist:  Lewayne Bunting, MD       Chief Complaint:  shortness of breath  Patient Profile:   Carol Richmond is a 72 y.o. female with rheumatic mitral valve disease, moderate to severe mitral valve stenosis, HFmrEF, AVNRT, status post radiofrequency ablation, OSA, CHB, s/p PPM, hypertension, carotid artery disease, type 2 diabetes, history of TIA  who is being seen 08/12/2022 for the evaluation of atrial flutter.  History of Present Illness:   Carol Richmond is a 72 year old female with a history of rheumatic mitral valve disease and moderate to severe mitral stenosis and also a history of AVNRT with prior ablation, complete heart block status post pacemaker who presents with several days of shortness of breath and fatigue and found to have sustained heart rates in the 140s with SVT.  Otherwise, she has been in her usual state of health.  Notes that she has been working outside a lot in the hot weather.  Feels like she was admitted dehydrated so but otherwise asymptomatic.  Then Saturday she went to a party and after that she was extremely fatigued more than usual.  Noted that she was hot and sweaty and also having some shortness of breath.  From there she called for evaluation and asked when she was found to have heart rates in the 140s to 150s.  She was brought in for further evaluation.  On arrival she had a creatinine of 1.3, sodium of 128, potassium of 5.4, BNP of 1029.  She was started on amiodarone infusion and transferred to [facility] for further management.  When I saw her, she says that she feels mostly asymptomatic except for a little hotness and fatigue.  Denies any chest pain.   Past  Medical History:  Diagnosis Date   Asthma    AVNRT (AV nodal re-entry tachycardia)    Carotid artery disease (HCC)    Cirrhosis (HCC)    Diverticulosis of colon (without mention of hemorrhage)    Essential hypertension    Functional ovarian cysts    GERD (gastroesophageal reflux disease)    Hemorrhoids, external, without mention of complication    Hormone replacement therapy (postmenopausal)    IBS (irritable bowel syndrome)    Migraine    Mitral valve disease    Pacemaker    Complete heart block - [employer]   Right knee DJD    Seizures (HCC)    Sleep apnea    TIA (transient ischemic attack)    Type 2 diabetes mellitus (HCC)     Past Surgical History:  Procedure Laterality Date   BIOPSY  09/23/2018   Procedure: BIOPSY;  Surgeon: Malissa Hippo, MD;  [facility];  Service: Endoscopy;;  duodeneum and stomach   CHOLECYSTECTOMY  1980   ENDARTERECTOMY Right 07/01/2017   Procedure: ENDARTERECTOMY CAROTID RIGHT;  Surgeon: Nada Libman, MD;  Location: [facility] OR;  Service: Vascular;  Laterality: Right;   ESOPHAGOGASTRODUODENOSCOPY N/A 09/23/2018   Procedure: ESOPHAGOGASTRODUODENOSCOPY (EGD);  Surgeon: Malissa Hippo, MD;  [facility];  Service: Endoscopy;  Laterality: N/A;  2:45-moved to 1:45 per Dewayne Hatch   ESOPHAGOGASTRODUODENOSCOPY (EGD) WITH PROPOFOL N/A 03/07/2021   Procedure: ESOPHAGOGASTRODUODENOSCOPY (EGD) WITH PROPOFOL;  Surgeon: Malissa Hippo,  MD;  [facility];  Service: Endoscopy;  Laterality: N/A;  12:50   LEFT HEART CATHETERIZATION WITH CORONARY ANGIOGRAM N/A 11/18/2013   Procedure: LEFT HEART CATHETERIZATION WITH CORONARY ANGIOGRAM;  Surgeon: Kathleene Hazel, MD;  Location: [facility] CATH LAB;  Service: Cardiovascular;  Laterality: N/A;   OOPHORECTOMY     2012 Dr. Donzetta Matters in [place]   PACEMAKER IMPLANT N/A 12/14/2021   Procedure: PACEMAKER IMPLANT;  Surgeon: Marinus Maw, MD;  Location: [facility] INVASIVE CV LAB;  Service: Cardiovascular;   Laterality: N/A;   PATCH ANGIOPLASTY Right 07/01/2017   Procedure: RIGHT CAROTID PATCH ANGIOPLASTY;  Surgeon: Nada Libman, MD;  [facility] OR;  Service: Vascular;  Laterality: Right;   SVT ABLATION N/A 12/14/2021   Procedure: SVT ABLATION;  Surgeon: Marinus Maw, MD;  [facility] INVASIVE CV LAB;  Service: Cardiovascular;  Laterality: N/A;   TEE WITHOUT CARDIOVERSION N/A 03/01/2022   Procedure: TRANSESOPHAGEAL ECHOCARDIOGRAM (TEE);  Surgeon: Thurmon Fair, MD;  Location: [facility] ENDOSCOPY;  Service: Cardiovascular;  Laterality: N/A;   TONSILLECTOMY     TOTAL KNEE ARTHROPLASTY Right 09/02/2012   Procedure: TOTAL KNEE ARTHROPLASTY- right;  Surgeon: Loreta Ave, MD;  Location: [facility] OR;  Service: Orthopedics;  Laterality: Right;   TUBAL LIGATION     TYMPANOMASTOIDECTOMY Left 04/02/2016   Procedure: LEFT CANAL WALL DOWN TYMPANOMASTOIDECTOMY;  Surgeon: Newman Pies, MD;  Location: [facility];  Service: ENT;  Laterality: Left;   VAGINAL HYSTERECTOMY  1990   WOUND EXPLORATION Right 07/02/2017   Procedure: RIGHT NECK EXPLORATION WITH INTRAOPERATIVE ULTRASOUND;  Surgeon: Chuck Hint, MD;  Location: [facility] OR;  Service: Vascular;  Laterality: Right;     Medications Prior to Admission: Prior to Admission medications   Medication Sig Start Date End Date Taking? Authorizing Provider  acetaminophen (TYLENOL) 500 MG tablet Take 500 mg by mouth daily as needed for mild pain.   Yes [provider]  aspirin EC 81 MG tablet Take 1 tablet (81 mg total) by mouth daily. 09/24/18  Yes Rehman, Joline Maxcy, MD  carvedilol (COREG) 3.125 MG tablet TAKE (1) TABLET TWICE DAILY. Patient taking differently: Take 3.125 mg by mouth 2 (two) times daily. 07/02/22  Yes Jonelle Sidle, MD  cetirizine (ZYRTEC ALLERGY) 10 MG tablet Take 1 tablet (10 mg total) by mouth daily. 07/30/22  Yes Hawks, Christy A, FNP  fluticasone (FLONASE) 50 MCG/ACT nasal spray Place 2 sprays into both nostrils  daily. Patient taking differently: Place 2 sprays into both nostrils daily as needed for allergies. 07/30/22  Yes Hawks, Christy A, FNP  gabapentin (NEURONTIN) 300 MG capsule Take 300 mg by mouth 3 (three) times daily as needed (neuropathy).   Yes [provider]  lactulose (CHRONULAC) 10 GM/15ML solution TAKE 30 MLS (20 GRAM TOTAL) BY MOUTH 3 TIMES A DAY. Patient taking differently: Take 20 g by mouth 3 (three) times daily. 05/30/22  Yes Dolores Frame, MD  lamoTRIgine (LAMICTAL) 200 MG tablet TAKE (1) TABLET TWICE DAILY. Patient taking differently: Take 200 mg by mouth 2 (two) times daily. 01/21/22  Yes Gabriel Earing, FNP  LORazepam (ATIVAN) 0.5 MG tablet Take 1 tablet (0.5 mg total) by mouth 2 (two) times daily as needed for anxiety. 07/22/22  Yes Gabriel Earing, FNP  metFORMIN (GLUCOPHAGE) 500 MG tablet Take 1 tablet (500 mg total) by mouth 2 (two) times daily with a meal. 07/22/22  Yes Gabriel Earing, FNP  Multiple Vitamins-Minerals (MULTIVITAMIN WOMEN 50+) TABS Take 1  tablet by mouth daily.   Yes [provider]  ondansetron (ZOFRAN) 4 MG tablet TAKE 1 TABLET BY MOUTH EVERY 8 HOURS AS NEEDED FOR NAUSEA AND VOMITING. Patient taking differently: Take 4 mg by mouth every 8 (eight) hours as needed for vomiting or nausea. 06/20/22  Yes Marguerita Merles, Reuel Boom, MD  pantoprazole (PROTONIX) 40 MG tablet TAKE (1) TABLET TWICE DAILY. Patient taking differently: Take 40 mg by mouth 2 (two) times daily. 06/10/22  Yes Carlan, Chelsea L, NP  potassium chloride SA (KLOR-CON M) 20 MEQ tablet take 2 tablets by mouth daily. Patient taking differently: Take 20 mEq by mouth 2 (two) times daily. 07/29/22  Yes Gabriel Earing, FNP  sacubitril-valsartan (ENTRESTO) 24-26 MG Take 1 tablet by mouth 2 (two) times daily. 02/04/22  Yes Jonelle Sidle, MD  traZODone (DESYREL) 50 MG tablet Take 1 tablet (50 mg total) by mouth at bedtime as needed for sleep. 07/22/22  Yes Gabriel Earing,  FNP  Accu-Chek Softclix Lancets lancets check blood sugars twice daily DX E11.65 11/28/21   Gabriel Earing, FNP  amoxicillin-clavulanate (AUGMENTIN) 875-125 MG tablet Take 1 tablet by mouth 2 (two) times daily. Patient not taking: Reported on 08/11/2022 08/01/22   Junie Spencer, FNP  Blood Pressure Monitoring (OMRON 3 SERIES BP MONITOR) DEVI Use as directed 08/09/22   Sharlene Dory, NP  clotrimazole [facility]) 10 MG troche Allow one to dissolve in the mouth 5 times daily For yeast Patient not taking: Reported on 08/11/2022 05/06/22   Mechele Claude, MD  furosemide (LASIX) 40 MG tablet TAKE 1 TABLET ONCE DAILY. Patient not taking: Reported on 08/11/2022 06/10/22   Jonelle Sidle, MD  glucose blood (ACCU-CHEK GUIDE) test strip Use as instructed to test blood sugar daily as directed. DX:E11.9 12/05/21   Gabriel Earing, FNP  levocetirizine (XYZAL) 5 MG tablet Take 1 tablet (5 mg total) by mouth every evening. Patient not taking: Reported on 08/11/2022 06/27/22   Gabriel Earing, FNP  magic mouthwash (lidocaine, diphenhydrAMINE, alum & mag hydroxide) suspension swish and swallow 5 mls 4 times daily as needed for mouth pain Patient not taking: Reported on 08/11/2022 06/12/22   Dolores Frame, MD  nystatin (MYCOSTATIN) 100000 UNIT/ML suspension Take 5 mLs (500,000 Units total) by mouth 4 (four) times daily. Patient not taking: Reported on 08/11/2022 07/30/22   Junie Spencer, FNP  promethazine-dextromethorphan (PROMETHAZINE-DM) 6.25-15 MG/5ML syrup Take 5 mLs by mouth 3 (three) times daily as needed for cough. Patient not taking: Reported on 08/11/2022 07/30/22   Junie Spencer, FNP     Allergies:   No Known Allergies  Social History:   Social History   Socioeconomic History   Marital status: Widowed    Spouse name: Not on file   Number of children: 3   Years of education: Not on file   Highest education level: Not on file  Occupational History   Occupation: retired  Tobacco  Use   Smoking status: Never   Smokeless tobacco: Never  Vaping Use   Vaping Use: Never used  Substance and Sexual Activity   Alcohol use: No    Alcohol/week: 0.0 standard drinks of alcohol   Drug use: No   Sexual activity: Not Currently  Other Topics Concern   Not on file  Social History Narrative   Lives alone. Granddaughter lives across the street, her 3 children all live nearby   Social Determinants of Health   Financial Resource Strain: Low Risk  (01/04/2022)  Overall Financial Resource Strain (CARDIA)    Difficulty of Paying Living Expenses: Not very hard  Food Insecurity: No Food Insecurity (01/04/2022)   Hunger Vital Sign    Worried About Running Out of Food in the Last Year: Never true    Ran Out of Food in the Last Year: Never true  Transportation Needs: No Transportation Needs (01/04/2022)   PRAPARE - Administrator, Civil Service (Medical): No    Lack of Transportation (Non-Medical): No  Physical Activity: Sufficiently Active (01/04/2022)   Exercise Vital Sign    Days of Exercise per Week: 5 days    Minutes of Exercise per Session: 30 min  Stress: No Stress Concern Present (01/04/2022)   [employer] of Occupational Health - Occupational Stress Questionnaire    Feeling of Stress : Not at all  Social Connections: Moderately Integrated (01/04/2022)   Social Connection and Isolation Panel [NHANES]    Frequency of Communication with Friends and Family: More than three times a week    Frequency of Social Gatherings with Friends and Family: More than three times a week    Attends Religious Services: More than 4 times per year    Active Member of [employer] or Organizations: Yes    Attends Banker Meetings: More than 4 times per year    Marital Status: Widowed  Intimate Partner Violence: Not At Risk (07/05/2021)   Humiliation, Afraid, Rape, and Kick questionnaire    Fear of Current or Ex-Partner: No    Emotionally Abused: No    Physically  Abused: No    Sexually Abused: No    Family History:   The patient's family history includes Cancer in her sister; Diabetes in her mother; Heart disease in her brother, brother, brother, mother, and son.    ROS:  Please see the history of present illness.  All other ROS reviewed and negative.     Physical Exam/Data:   Vitals:   08/11/22 1924 08/11/22 2000 08/12/22 0016 08/12/22 0339  BP: 106/82 92/74 98/69  99/67  Pulse: (!) 144 (!) 146 (!) 129 (!) 106  Resp: 19 (!) 22 19 20   Temp: 98.6 F (37 C)  97.8 F (36.6 C) 98.3 F (36.8 C)  TempSrc: Oral  Oral Oral  SpO2: 97% 96% 95% 90%  Weight: 69.9 kg   71.2 kg  Height: 5\' 4"  (1.626 m)       Intake/Output Summary (Last 24 hours) at 08/12/2022 0431 Last data filed at 08/12/2022 0343 Gross per 24 hour  Intake 315.18 ml  Output 450 ml  Net -134.82 ml      08/12/2022    3:39 AM 08/11/2022    7:24 PM 08/11/2022   11:45 AM  Last 3 Weights  Weight (lbs) 156 lb 15.5 oz 154 lb 1.6 oz 152 lb  Weight (kg) 71.2 kg 69.9 kg 68.947 kg     Body mass index is 26.94 kg/m.  General:  Well nourished, well developed, in no acute distress HEENT: normal Neck: no JVD Vascular: No carotid bruits; Distal pulses 2+ bilaterally   Cardiac:  normal S1, S2; tachycardic and regular; no murmur  Lungs:  clear to auscultation bilaterally, no wheezing, rhonchi or rales  Abd: soft, nontender, no hepatomegaly  Ext: no edema Musculoskeletal:  No deformities, BUE and BLE strength normal and equal Skin: warm and dry  Neuro:  CNs 2-12 intact, no focal abnormalities noted Psych:  Normal affect    EKG:  The ECG that was done  was personally reviewed and demonstrates SVT with heart rate 140  Relevant CV Studies:  Echo 12/2021: 1. Left ventricular ejection fraction, by estimation, is 40 to 45%. The  left ventricle has mildly decreased function. Left ventricular endocardial  border not optimally defined to evaluate regional wall motion. Left  ventricular  diastolic parameters are  consistent with Grade I diastolic dysfunction (impaired relaxation).  Elevated left atrial pressure. The average left ventricular global  longitudinal strain is -15.5 %. The global longitudinal strain is  abnormal.   2. Right ventricular systolic function is normal. The right ventricular  size is normal. Tricuspid regurgitation signal is inadequate for assessing  PA pressure.   3. Left atrial size was mildly dilated.   4. MR jet is posterior and eccentric which makes quantification  difficult. MV/AV VTI ratio is 1.5 suggesting moderate to severe MR. . The  mitral valve is abnormal. Moderate to severe mitral valve regurgitation.  Moderate to severe mitral stenosis. The  mean mitral valve gradient is 9.0 mmHg. Heart rate of 80 bpm.   5. The aortic valve has an indeterminant number of cusps. There is mild  calcification of the aortic valve. There is mild thickening of the aortic  valve. Aortic valve regurgitation is mild. No aortic stenosis is present.   6. The inferior vena cava is normal in size with greater than 50%  respiratory variability, suggesting right atrial pressure of 3 mmHg.   Laboratory Data:  High Sensitivity Troponin:  No results for input(s): "TROPONINIHS" in the last 720 hours.    Chemistry Recent Labs  Lab 08/11/22 1206  NA 128*  K 5.4*  CL 97*  CO2 20*  GLUCOSE 197*  BUN 34*  CREATININE 1.32*  CALCIUM 9.4  MG 2.1  GFRNONAA 43*  ANIONGAP 11    Recent Labs  Lab 08/11/22 1206  PROT 7.1  ALBUMIN 3.4*  AST 27  ALT 16  ALKPHOS 64  BILITOT 0.9   Lipids No results for input(s): "CHOL", "TRIG", "HDL", "LABVLDL", "LDLCALC", "CHOLHDL" in the last 168 hours. Hematology Recent Labs  Lab 08/11/22 1206  WBC 8.9  RBC 3.77*  HGB 11.9*  HCT 34.2*  MCV 90.7  MCH 31.6  MCHC 34.8  RDW 13.0  PLT 224   Thyroid No results for input(s): "TSH", "FREET4" in the last 168 hours. BNP Recent Labs  Lab 08/11/22 1206  BNP 1,029.0*     DDimer No results for input(s): "DDIMER" in the last 168 hours.   Radiology/Studies:  DG Chest Portable 1 View  Result Date: 08/11/2022 CLINICAL DATA:  Shortness of breath for several days EXAM: PORTABLE CHEST 1 VIEW COMPARISON:  12/15/2021 FINDINGS: Cardiac shadow is mildly prominent but stable from the prior exam. Pacing device is again seen. Lungs are well aerated bilaterally. No focal infiltrate is noted. Mild central vascular congestion is seen without edema. No bony abnormality is noted. IMPRESSION: Mild vascular congestion without significant edema. Electronically Signed   By: Alcide Clever M.D.   On: 08/11/2022 11:56     Assessment and Plan:   Supraventricular tachycardia.  This is likely atrial flutter versus AVNRT.  Her heart rates have been sustained in the 140s and not very fluctuant and thus makes me feel this is most likely atrial flutter.  Though she does note that she was dehydrated however she does not have any real triggers.  Will continue amiodarone and also try low-dose rate control with metoprolol.  If this is atrial flutter, will need anticoagulation.  Will  go ahead and start her on heparin infusion for now.  She has mitral stenosis so long-term anticoagulation will likely need to be warfarin. Heart failure with reduced ejection fraction.  She is stable and euvolemic at this time.  No need for diuretics. Rheumatic mitral disease with moderate to severe mitral stenosis.  As above, stable from that perspective.  Continue aspirin. Type 2 diabetes.  Monitor blood sugars with sliding scale insulin. Neuropathy.  Continue gabapentin. Nonalcoholic cirrhosis.  Continue lactulose and PPI  History of seizures. Continue Lamictal    Risk Assessment/Risk Scores:       New York Heart Association (NYHA) Functional Class NYHA Class I     Severity of Illness: The appropriate patient status for this patient is INPATIENT. Inpatient status is judged to be reasonable and necessary in  order to provide the required intensity of service to ensure the patient's safety. The patient's presenting symptoms, physical exam findings, and initial radiographic and laboratory data in the context of their chronic comorbidities is felt to place them at high risk for further clinical deterioration. Furthermore, it is not anticipated that the patient will be medically stable for discharge from the hospital within 2 midnights of admission.   * I certify that at the point of admission it is my clinical judgment that the patient will require inpatient hospital care spanning beyond 2 midnights from the point of admission due to high intensity of service, high risk for further deterioration and high frequency of surveillance required.*   For questions or updates, please contact [facility] Please consult [email or web address] for contact info under     Signed, Joellen Jersey, MD  08/12/2022 4:31 AM

## 2022-08-12 NOTE — Progress Notes (Signed)
ANTICOAGULATION CONSULT NOTE - Follow Up Consult  Pharmacy Consult for IV heparin Indication: atrial fibrillation  No Known Allergies  Patient Measurements: Height: 5\' 4"  (162.6 cm) Weight: 71.2 kg (156 lb 15.5 oz) IBW/kg (Calculated) : 54.7 Heparin Dosing Weight: 68.8 kg  Vital Signs: Temp: 97.8 F (36.6 C) (06/24 1650) BP: 94/70 (06/24 1650) Pulse Rate: 97 (06/24 1650)  Labs: Recent Labs    08/11/22 1206 08/12/22 0730 08/12/22 1634  HGB 11.9* 12.0  --   HCT 34.2* 35.2*  --   PLT 224 233  --   LABPROT  --  14.4  --   INR  --  1.1  --   HEPARINUNFRC  --  0.26* 0.42  CREATININE 1.32* 1.23*  --     Assessment: 72 yo female presented w/ shortness of breath. PMH includes HF, mitral stenosis, and pacemaker placement.; has new diagnosis of Afib and was to be seen in [facility]ib Clinic on 6/24. No anticoagulation PTA. CHADS-VASc = 5. Pharmacy consulted to dose IV heparin.   HL 0.42 - therapeutic after rate increase to 1150 units/hr -no issue with line/infusion and no s/sx of bleeding per RN  Goal of Therapy:  Heparin level 0.3-0.7 units/ml INR 2.0-3.0 Monitor platelets by anticoagulation protocol: Yes   Plan:  Continue heparin drip to 1150 units/hr Heparin level in 8 hours to confirm therapeutic Daily heparin level, CBC, INR  Loralee Pacas, PharmD, BCPS 08/12/2022 5:06 PM  Please check AMION for all [facility] Pharmacy phone numbers After 10:00 PM, call Main Pharmacy [phone]

## 2022-08-12 NOTE — Consult Note (Signed)
   [facility] CM Inpatient Consult   08/12/2022  Carol Richmond 05-Sep-1950 [id number]  [facility] [[facility]]  Accountable Care Organization [ACO] Patient: [name]  Primary Care Provider:  Gabriel Earing, FNP with [facility] Medicine which is listed for the transition of care follow up.  Patient is currently active with [facility] [[facility]] Chronic Care Management program for chronic disease management services.  Patient has been engaged by a Charity fundraiser CCM.  Our community based plan of care has focused on disease management and community resource support.   Met with the patient at the bedside.  Patient is up in the chair. Patient was accepting and generous.  Explained reason for rounding visit. Patient was given an appointment reminder card.  She endorses active with CCM team. Patient will receive a post hospital call and will be evaluated for assessments and disease process education.    Plan: Continue to follow for any additional needs.   Of note, [facility] Care Management services does not replace or interfere with any services that are needed or arranged by inpatient [facility] care management team.   For additional questions or referrals please contact:  Charlesetta Shanks, RN BSN CCM [facility]  [phone] business mobile phone Toll free office [phone]  *Concierge Line  [phone] Fax number: [phone] [email or web address]

## 2022-08-13 DIAGNOSIS — B001 Herpesviral vesicular dermatitis: Secondary | ICD-10-CM | POA: Diagnosis not present

## 2022-08-13 DIAGNOSIS — I4892 Unspecified atrial flutter: Secondary | ICD-10-CM | POA: Diagnosis not present

## 2022-08-13 LAB — HEPARIN LEVEL (UNFRACTIONATED)
Heparin Unfractionated: 0.48 IU/mL (ref 0.30–0.70)
Heparin Unfractionated: 0.48 IU/mL (ref 0.30–0.70)

## 2022-08-13 LAB — CBC
HCT: 34.4 % — ABNORMAL LOW (ref 36.0–46.0)
Hemoglobin: 11.5 g/dL — ABNORMAL LOW (ref 12.0–15.0)
MCH: 30.6 pg (ref 26.0–34.0)
MCHC: 33.4 g/dL (ref 30.0–36.0)
MCV: 91.5 fL (ref 80.0–100.0)
Platelets: 207 10*3/uL (ref 150–400)
RBC: 3.76 MIL/uL — ABNORMAL LOW (ref 3.87–5.11)
RDW: 13.2 % (ref 11.5–15.5)
WBC: 9.9 10*3/uL (ref 4.0–10.5)
nRBC: 0 % (ref 0.0–0.2)

## 2022-08-13 LAB — GLUCOSE, CAPILLARY
Glucose-Capillary: 155 mg/dL — ABNORMAL HIGH (ref 70–99)
Glucose-Capillary: 148 mg/dL — ABNORMAL HIGH (ref 70–99)

## 2022-08-13 LAB — PROTIME-INR
INR: 1.1 (ref 0.8–1.2)
Prothrombin Time: 14.2 seconds (ref 11.4–15.2)

## 2022-08-13 MED ORDER — SACUBITRIL-VALSARTAN 24-26 MG PO TABS
1.0000 | ORAL_TABLET | Freq: Two times a day (BID) | ORAL | Status: DC
  Administered 2022-08-13 – 2022-08-15 (×5): 1 via ORAL
  Filled 2022-08-13 (×5): qty 1

## 2022-08-13 MED ORDER — SODIUM CHLORIDE 0.9 % IV SOLN: INTRAVENOUS | Status: DC

## 2022-08-13 MED ORDER — WARFARIN SODIUM 7.5 MG PO TABS
7.5000 mg | ORAL_TABLET | Freq: Once | ORAL | Status: AC
  Administered 2022-08-13: 7.5 mg via ORAL
  Filled 2022-08-13: qty 1

## 2022-08-13 NOTE — Progress Notes (Cosign Needed)
  Patient is scheduled for a TEE/DCCV tomorrow. NPO at midnight   Informed Consent   Shared Decision Making/Informed Consent The risks [stroke, cardiac arrhythmias rarely resulting in the need for a temporary or permanent pacemaker, skin irritation or burns, esophageal damage, perforation (1:10,000 risk), bleeding, pharyngeal hematoma as well as other potential complications associated with conscious sedation including aspiration, arrhythmia, respiratory failure and death], benefits (treatment guidance, restoration of normal sinus rhythm, diagnostic support) and alternatives of a transesophageal echocardiogram guided cardioversion were discussed in detail with Carol Richmond and she is willing to proceed.   Jonita Albee, PA-C 08/13/2022 10:49 AM

## 2022-08-13 NOTE — Plan of Care (Signed)

## 2022-08-13 NOTE — Progress Notes (Signed)
Mobility Specialist Progress Note:   08/13/22 1503  Therapy Vitals  Temp 98.1 F (36.7 C)  Temp Source Oral  Pulse Rate 60  Resp 18  BP (!) 91/55  Patient Position (if appropriate) Sitting  Oxygen Therapy  SpO2 98 %  O2 Device Room Air  Mobility  Activity Ambulated with assistance in hallway  Level of Assistance Contact guard assist, steadying assist  Assistive Device None  Distance Ambulated (ft) 75 ft  Activity Response Tolerated fair  Mobility Referral Yes  $Mobility charge 1 Mobility  Mobility Specialist Start Time (ACUTE ONLY) 1520  Mobility Specialist Stop Time (ACUTE ONLY) 1527  Mobility Specialist Time Calculation (min) (ACUTE ONLY) 7 min    During Mobility: 123 HR,    Pt received in chair, agreeable to mobility. Ambulated in hallway, no AD w/ CG. C/o of SOB and required x1 standing rest break. Pt left in chair with NT present.  D'Vante Earlene Plater Mobility Specialist Please contact via Special educational needs teacher or Rehab office at [phone]

## 2022-08-13 NOTE — H&P (View-Only) (Signed)
  DAILY PROGRESS NOTE   Patient Name: Carol Richmond Date of Encounter: 08/13/2022 Cardiologist: Samuel McDowell, MD  Chief Complaint   No complaints  Patient Profile   Carol Richmond is a 72 y.o. female with rheumatic mitral valve disease, moderate to severe mitral valve stenosis, HFmrEF, AVNRT, status post radiofrequency ablation, OSA, CHB, s/p PPM, hypertension, carotid artery disease, type 2 diabetes, history of TIA  who is being seen 08/12/2022 for the evaluation of atrial flutter.   Subjective   On IV heparin and warfarin.  Remains in atrial flutter at 120 on amiodarone. No complaints.  Objective   Vitals:   08/12/22 1935 08/12/22 2359 08/13/22 0100 08/13/22 0410  BP: 109/71 122/86  115/82  Pulse: 99 95  (!) 120  Resp: 13 20 20 20  Temp: 97.8 F (36.6 C) 98.1 F (36.7 C)  98.2 F (36.8 C)  TempSrc: Oral Oral  Oral  SpO2: 100% 98%  100%  Weight:    69.2 kg  Height:        Intake/Output Summary (Last 24 hours) at 08/13/2022 0829 Last data filed at 08/13/2022 0415 Gross per 24 hour  Intake 360 ml  Output 1550 ml  Net -1190 ml   Filed Weights   08/11/22 1924 08/12/22 0339 08/13/22 0410  Weight: 69.9 kg 71.2 kg 69.2 kg    Physical Exam   General appearance: alert and no distress Neck: no carotid bruit, no JVD, and thyroid not enlarged, symmetric, no tenderness/mass/nodules Lungs: clear to auscultation bilaterally Heart: regular tachycardia Abdomen: soft, non-tender; bowel sounds normal; no masses,  no organomegaly Extremities: extremities normal, atraumatic, no cyanosis or edema Pulses: 2+ and symmetric Skin: Skin color, texture, turgor normal. No rashes or lesions Neurologic: Grossly normal Psych: Pleasant  Inpatient Medications    Scheduled Meds:  acyclovir ointment   Topical 5 X Daily   aspirin EC  81 mg Oral Daily   lactulose  20 g Oral TID   lamoTRIgine  200 mg Oral BID   melatonin  5 mg Oral QHS   metoprolol tartrate  12.5 mg Oral  BID   pantoprazole  40 mg Oral BID   Warfarin - Pharmacist Dosing Inpatient   Does not apply q1600    Continuous Infusions:  amiodarone 30 mg/hr (08/13/22 0616)   heparin 1,150 Units/hr (08/12/22 1616)    PRN Meds: acetaminophen, gabapentin, ondansetron (ZOFRAN) IV, traZODone   Labs   Results for orders placed or performed during the hospital encounter of 08/11/22 (from the past 48 hour(s))  Comprehensive metabolic panel     Status: Abnormal   Collection Time: 08/11/22 12:06 PM  Result Value Ref Range   Sodium 128 (L) 135 - 145 mmol/L   Potassium 5.4 (H) 3.5 - 5.1 mmol/L   Chloride 97 (L) 98 - 111 mmol/L   CO2 20 (L) 22 - 32 mmol/L   Glucose, Bld 197 (H) 70 - 99 mg/dL    Comment: Glucose reference range applies only to samples taken after fasting for at least 8 hours.   BUN 34 (H) 8 - 23 mg/dL   Creatinine, Ser 1.32 (H) 0.44 - 1.00 mg/dL   Calcium 9.4 8.9 - 10.3 mg/dL   Total Protein 7.1 6.5 - 8.1 g/dL   Albumin 3.4 (L) 3.5 - 5.0 g/dL   AST 27 15 - 41 U/L   ALT 16 0 - 44 U/L   Alkaline Phosphatase 64 38 - 126 U/L   Total Bilirubin 0.9 0.3 - 1.2   mg/dL   GFR, Estimated 43 (L) >60 mL/min    Comment: (NOTE) Calculated using the CKD-EPI Creatinine Equation (2021)    Anion gap 11 5 - 15    Comment: Performed at [facility], [address] [place], [place] [address]  CBC with Differential     Status: Abnormal   Collection Time: 08/11/22 12:06 PM  Result Value Ref Range   WBC 8.9 4.0 - 10.5 K/uL   RBC 3.77 (L) 3.87 - 5.11 MIL/uL   Hemoglobin 11.9 (L) 12.0 - 15.0 g/dL   HCT 34.2 (L) 36.0 - 46.0 %   MCV 90.7 80.0 - 100.0 fL   MCH 31.6 26.0 - 34.0 pg   MCHC 34.8 30.0 - 36.0 g/dL   RDW 13.0 11.5 - 15.5 %   Platelets 224 150 - 400 K/uL   nRBC 0.0 0.0 - 0.2 %   Neutrophils Relative % 67 %   Neutro Abs 6.0 1.7 - 7.7 K/uL   Lymphocytes Relative 18 %   Lymphs Abs 1.6 0.7 - 4.0 K/uL   Monocytes Relative 13 %   Monocytes Absolute 1.2 (H) 0.1 - 1.0 K/uL   Eosinophils Relative  2 %   Eosinophils Absolute 0.1 0.0 - 0.5 K/uL   Basophils Relative 0 %   Basophils Absolute 0.0 0.0 - 0.1 K/uL   Immature Granulocytes 0 %   Abs Immature Granulocytes 0.03 0.00 - 0.07 K/uL    Comment: Performed at [facility], [address] [place], [place] [address]  Magnesium     Status: None   Collection Time: 08/11/22 12:06 PM  Result Value Ref Range   Magnesium 2.1 1.7 - 2.4 mg/dL    Comment: Performed at [facility], [address] [place], [place] [address]  Brain natriuretic peptide     Status: Abnormal   Collection Time: 08/11/22 12:06 PM  Result Value Ref Range   B Natriuretic Peptide 1,029.0 (H) 0.0 - 100.0 pg/mL    Comment: Performed at [facility], [address] [place], [place] [address]  CBG monitoring, ED     Status: Abnormal   Collection Time: 08/11/22  4:56 PM  Result Value Ref Range   Glucose-Capillary 135 (H) 70 - 99 mg/dL    Comment: Glucose reference range applies only to samples taken after fasting for at least 8 hours.  Glucose, capillary     Status: Abnormal   Collection Time: 08/11/22 10:09 PM  Result Value Ref Range   Glucose-Capillary 142 (H) 70 - 99 mg/dL    Comment: Glucose reference range applies only to samples taken after fasting for at least 8 hours.  Glucose, capillary     Status: Abnormal   Collection Time: 08/12/22  6:22 AM  Result Value Ref Range   Glucose-Capillary 148 (H) 70 - 99 mg/dL    Comment: Glucose reference range applies only to samples taken after fasting for at least 8 hours.  Protime-INR     Status: None   Collection Time: 08/12/22  7:30 AM  Result Value Ref Range   Prothrombin Time 14.4 11.4 - 15.2 seconds   INR 1.1 0.8 - 1.2    Comment: (NOTE) INR goal varies based on device and disease states. Performed at [facility], [address] , [place] [address]   Brain natriuretic peptide     Status: Abnormal   Collection Time: 08/12/22  7:30 AM  Result Value Ref Range   B Natriuretic Peptide 950.5 (H) 0.0  - 100.0 pg/mL      Comment: Performed at [facility], [address] [place], [place] [address]  TSH     Status: None   Collection Time: 08/12/22  7:30 AM  Result Value Ref Range   TSH 1.245 0.350 - 4.500 uIU/mL    Comment: Performed by a 3rd Generation assay with a functional sensitivity of <=0.01 uIU/mL. Performed at [facility], [address] [place], [place] [address]   T4, free     Status: Abnormal   Collection Time: 08/12/22  7:30 AM  Result Value Ref Range   Free T4 1.64 (H) 0.61 - 1.12 ng/dL    Comment: (NOTE) Biotin ingestion may interfere with free T4 tests. If the results are inconsistent with the TSH level, previous test results, or the clinical presentation, then consider biotin interference. If needed, order repeat testing after stopping biotin. Performed at [facility], [address] [place], [place] [address]   Magnesium     Status: None   Collection Time: 08/12/22  7:30 AM  Result Value Ref Range   Magnesium 1.8 1.7 - 2.4 mg/dL    Comment: Performed at [facility], [address] [place], [place] [address]  Comprehensive metabolic panel     Status: Abnormal   Collection Time: 08/12/22  7:30 AM  Result Value Ref Range   Sodium 132 (L) 135 - 145 mmol/L   Potassium 4.5 3.5 - 5.1 mmol/L   Chloride 101 98 - 111 mmol/L   CO2 20 (L) 22 - 32 mmol/L   Glucose, Bld 153 (H) 70 - 99 mg/dL    Comment: Glucose reference range applies only to samples taken after fasting for at least 8 hours.   BUN 27 (H) 8 - 23 mg/dL   Creatinine, Ser 1.23 (H) 0.44 - 1.00 mg/dL   Calcium 9.1 8.9 - 10.3 mg/dL   Total Protein 6.5 6.5 - 8.1 g/dL   Albumin 3.2 (L) 3.5 - 5.0 g/dL   AST 26 15 - 41 U/L   ALT 16 0 - 44 U/L   Alkaline Phosphatase 61 38 - 126 U/L   Total Bilirubin 0.8 0.3 - 1.2 mg/dL   GFR, Estimated 47 (L) >60 mL/min    Comment: (NOTE) Calculated using the CKD-EPI Creatinine Equation (2021)    Anion gap 11 5 - 15    Comment: Performed at [facility], [address] [place], [place] [address]  Heparin level (unfractionated)     Status: Abnormal   Collection Time: 08/12/22  7:30 AM  Result Value Ref Range   Heparin Unfractionated 0.26 (L) 0.30 - 0.70 IU/mL    Comment: (NOTE) The clinical reportable range upper limit is being lowered to >1.10 to align with the FDA approved guidance for the current laboratory assay.  If heparin results are below expected values, and patient dosage has  been confirmed, suggest follow up testing of antithrombin III levels. Performed at [facility], [address] [place], [place] [address]   CBC     Status: Abnormal   Collection Time: 08/12/22  7:30 AM  Result Value Ref Range   WBC 9.5 4.0 - 10.5 K/uL   RBC 3.95 3.87 - 5.11 MIL/uL   Hemoglobin 12.0 12.0 - 15.0 g/dL   HCT 35.2 (L) 36.0 - 46.0 %   MCV 89.1 80.0 - 100.0 fL   MCH 30.4 26.0 - 34.0 pg   MCHC 34.1 30.0 - 36.0 g/dL   RDW 13.0 11.5 -   15.5 %   Platelets 233 150 - 400 K/uL   nRBC 0.0 0.0 - 0.2 %    Comment: Performed at [facility], [address] [place], [place] [address]  Glucose, capillary     Status: Abnormal   Collection Time: 08/12/22 11:44 AM  Result Value Ref Range   Glucose-Capillary 198 (H) 70 - 99 mg/dL    Comment: Glucose reference range applies only to samples taken after fasting for at least 8 hours.  Heparin level (unfractionated)     Status: None   Collection Time: 08/12/22  4:34 PM  Result Value Ref Range   Heparin Unfractionated 0.42 0.30 - 0.70 IU/mL    Comment: (NOTE) The clinical reportable range upper limit is being lowered to >1.10 to align with the FDA approved guidance for the current laboratory assay.  If heparin results are below expected values, and patient dosage has  been confirmed, suggest follow up testing of antithrombin III levels. Performed at [facility], [address] [place], [place] [address]   Glucose, capillary     Status: Abnormal   Collection Time:  08/12/22  4:45 PM  Result Value Ref Range   Glucose-Capillary 111 (H) 70 - 99 mg/dL    Comment: Glucose reference range applies only to samples taken after fasting for at least 8 hours.  Glucose, capillary     Status: Abnormal   Collection Time: 08/12/22  9:19 PM  Result Value Ref Range   Glucose-Capillary 152 (H) 70 - 99 mg/dL    Comment: Glucose reference range applies only to samples taken after fasting for at least 8 hours.   Comment 1 Notify RN    Comment 2 Document in Chart   Heparin level (unfractionated)     Status: None   Collection Time: 08/12/22 11:15 PM  Result Value Ref Range   Heparin Unfractionated 0.48 0.30 - 0.70 IU/mL    Comment: (NOTE) The clinical reportable range upper limit is being lowered to >1.10 to align with the FDA approved guidance for the current laboratory assay.  If heparin results are below expected values, and patient dosage has  been confirmed, suggest follow up testing of antithrombin III levels. Performed at [facility], [address] [place], [place] [address]   Heparin level (unfractionated)     Status: None   Collection Time: 08/13/22 12:26 AM  Result Value Ref Range   Heparin Unfractionated 0.48 0.30 - 0.70 IU/mL    Comment: (NOTE) The clinical reportable range upper limit is being lowered to >1.10 to align with the FDA approved guidance for the current laboratory assay.  If heparin results are below expected values, and patient dosage has  been confirmed, suggest follow up testing of antithrombin III levels. Performed at [facility], [address] [place], [place] [address]   CBC     Status: Abnormal   Collection Time: 08/13/22 12:26 AM  Result Value Ref Range   WBC 9.9 4.0 - 10.5 K/uL   RBC 3.76 (L) 3.87 - 5.11 MIL/uL   Hemoglobin 11.5 (L) 12.0 - 15.0 g/dL   HCT 34.4 (L) 36.0 - 46.0 %   MCV 91.5 80.0 - 100.0 fL   MCH 30.6 26.0 - 34.0 pg   MCHC 33.4 30.0 - 36.0 g/dL   RDW 13.2 11.5 - 15.5 %   Platelets 207  150 - 400 K/uL   nRBC 0.0 0.0 - 0.2 %    Comment: Performed at [facility], [address] [place], [place] [address]  Protime-INR     Status: None   Collection Time: 08/13/22 12:26 AM  Result Value Ref Range   Prothrombin Time 14.2 11.4 - 15.2 seconds   INR 1.1 0.8 - 1.2    Comment: (NOTE) INR goal varies based on device and disease states. Performed at [facility], [address] [place], [place] [address]   Glucose, capillary     Status: Abnormal   Collection Time: 08/13/22  6:19 AM  Result Value Ref Range   Glucose-Capillary 155 (H) 70 - 99 mg/dL    Comment: Glucose reference range applies only to samples taken after fasting for at least 8 hours.    ECG   N/A  Telemetry   Atrial flutter at 120 - Personally Reviewed  Radiology    DG Chest Portable 1 View  Result Date: 08/11/2022 CLINICAL DATA:  Shortness of breath for several days EXAM: PORTABLE CHEST 1 VIEW COMPARISON:  12/15/2021 FINDINGS: Cardiac shadow is mildly prominent but stable from the prior exam. Pacing device is again seen. Lungs are well aerated bilaterally. No focal infiltrate is noted. Mild central vascular congestion is seen without edema. No bony abnormality is noted. IMPRESSION: Mild vascular congestion without significant edema. Electronically Signed   By: Mark  Lukens M.D.   On: 08/11/2022 11:56    Cardiac Studies   N/A  Assessment   Principal Problem:   A-fib (HCC) Active Problems:   Atrial flutter (HCC)   Cold sore   Plan   Remains in atrial flutter-  BNP was 950, but no S/S of CHF. Will restart Entresto as BP has improved. TSH normal, but free T4 was high - unknown etiology. Not on thyroid replacement at home. Continue amiodarone, warfarin and heparin. Will arrange for TEE/DCCV-  hopefully tomorrow or Thursday.  Time Spent Directly with Patient:  I have spent a total of 25 minutes with the patient reviewing hospital notes, telemetry, EKGs, labs and examining the patient as  well as establishing an assessment and plan that was discussed personally with the patient.  > 50% of time was spent in direct patient care.  Length of Stay:  LOS: 2 days   Talen Poser C. Armend Hochstatter, MD, FACC, FACP  [place]  [facility]  Medical Director of the [facility] Diplomate of the American Board of Clinical Lipidology Attending Cardiologist  Direct Dial: [phone]  Fax: [phone]  Website:  [email or web address]  Simona Rocque C Latriece Anstine 08/13/2022, 8:29 AM   

## 2022-08-13 NOTE — Progress Notes (Addendum)
DAILY PROGRESS NOTE   Patient Name: Carol Richmond Date of Encounter: 08/13/2022 Cardiologist: Nona Dell, MD  Chief Complaint   No complaints  Patient Profile   Carol Richmond is a 72 y.o. female with rheumatic mitral valve disease, moderate to severe mitral valve stenosis, HFmrEF, AVNRT, status post radiofrequency ablation, OSA, CHB, s/p PPM, hypertension, carotid artery disease, type 2 diabetes, history of TIA  who is being seen 08/12/2022 for the evaluation of atrial flutter.   Subjective   On IV heparin and warfarin.  Remains in atrial flutter at 120 on amiodarone. No complaints.  Objective   Vitals:   08/12/22 1935 08/12/22 2359 08/13/22 0100 08/13/22 0410  BP: 109/71 122/86  115/82  Pulse: 99 95  (!) 120  Resp: 13 20 20 20   Temp: 97.8 F (36.6 C) 98.1 F (36.7 C)  98.2 F (36.8 C)  TempSrc: Oral Oral  Oral  SpO2: 100% 98%  100%  Weight:    69.2 kg  Height:        Intake/Output Summary (Last 24 hours) at 08/13/2022 0829 Last data filed at 08/13/2022 0415 Gross per 24 hour  Intake 360 ml  Output 1550 ml  Net -1190 ml   Filed Weights   08/11/22 1924 08/12/22 0339 08/13/22 0410  Weight: 69.9 kg 71.2 kg 69.2 kg    Physical Exam   General appearance: alert and no distress Neck: no carotid bruit, no JVD, and thyroid not enlarged, symmetric, no tenderness/mass/nodules Lungs: clear to auscultation bilaterally Heart: regular tachycardia Abdomen: soft, non-tender; bowel sounds normal; no masses,  no organomegaly Extremities: extremities normal, atraumatic, no cyanosis or edema Pulses: 2+ and symmetric Skin: Skin color, texture, turgor normal. No rashes or lesions Neurologic: Grossly normal Psych: Pleasant  Inpatient Medications    Scheduled Meds:  acyclovir ointment   Topical 5 X Daily   aspirin EC  81 mg Oral Daily   lactulose  20 g Oral TID   lamoTRIgine  200 mg Oral BID   melatonin  5 mg Oral QHS   metoprolol tartrate  12.5 mg Oral  BID   pantoprazole  40 mg Oral BID   Warfarin - Pharmacist Dosing Inpatient   Does not apply q1600    Continuous Infusions:  amiodarone 30 mg/hr (08/13/22 0616)   heparin 1,150 Units/hr (08/12/22 1616)    PRN Meds: acetaminophen, gabapentin, ondansetron (ZOFRAN) IV, traZODone   Labs   Results for orders placed or performed during the hospital encounter of 08/11/22 (from the past 48 hour(s))  Comprehensive metabolic panel     Status: Abnormal   Collection Time: 08/11/22 12:06 PM  Result Value Ref Range   Sodium 128 (L) 135 - 145 mmol/L   Potassium 5.4 (H) 3.5 - 5.1 mmol/L   Chloride 97 (L) 98 - 111 mmol/L   CO2 20 (L) 22 - 32 mmol/L   Glucose, Bld 197 (H) 70 - 99 mg/dL    Comment: Glucose reference range applies only to samples taken after fasting for at least 8 hours.   BUN 34 (H) 8 - 23 mg/dL   Creatinine, Ser 1.61 (H) 0.44 - 1.00 mg/dL   Calcium 9.4 8.9 - 09.6 mg/dL   Total Protein 7.1 6.5 - 8.1 g/dL   Albumin 3.4 (L) 3.5 - 5.0 g/dL   AST 27 15 - 41 U/L   ALT 16 0 - 44 U/L   Alkaline Phosphatase 64 38 - 126 U/L   Total Bilirubin 0.9 0.3 - 1.2  mg/dL   GFR, Estimated 43 (L) >60 mL/min    Comment: (NOTE) Calculated using the CKD-EPI Creatinine Equation (2021)    Anion gap 11 5 - 15    Comment: Performed at [facility], [address], [place], [place] [address]  CBC with Differential     Status: Abnormal   Collection Time: 08/11/22 12:06 PM  Result Value Ref Range   WBC 8.9 4.0 - 10.5 K/uL   RBC 3.77 (L) 3.87 - 5.11 MIL/uL   Hemoglobin 11.9 (L) 12.0 - 15.0 g/dL   HCT 60.4 (L) 54.0 - 98.1 %   MCV 90.7 80.0 - 100.0 fL   MCH 31.6 26.0 - 34.0 pg   MCHC 34.8 30.0 - 36.0 g/dL   RDW 19.1 47.8 - 29.5 %   Platelets 224 150 - 400 K/uL   nRBC 0.0 0.0 - 0.2 %   Neutrophils Relative % 67 %   Neutro Abs 6.0 1.7 - 7.7 K/uL   Lymphocytes Relative 18 %   Lymphs Abs 1.6 0.7 - 4.0 K/uL   Monocytes Relative 13 %   Monocytes Absolute 1.2 (H) 0.1 - 1.0 K/uL   Eosinophils Relative  2 %   Eosinophils Absolute 0.1 0.0 - 0.5 K/uL   Basophils Relative 0 %   Basophils Absolute 0.0 0.0 - 0.1 K/uL   Immature Granulocytes 0 %   Abs Immature Granulocytes 0.03 0.00 - 0.07 K/uL    Comment: Performed at [facility], [address], [place], [place] [address]  Magnesium     Status: None   Collection Time: 08/11/22 12:06 PM  Result Value Ref Range   Magnesium 2.1 1.7 - 2.4 mg/dL    Comment: Performed at [facility], [address] [place], [place] [address]  Brain natriuretic peptide     Status: Abnormal   Collection Time: 08/11/22 12:06 PM  Result Value Ref Range   B Natriuretic Peptide 1,029.0 (H) 0.0 - 100.0 pg/mL    Comment: Performed at [facility], [address], [place], [place] [address]  CBG monitoring, ED     Status: Abnormal   Collection Time: 08/11/22  4:56 PM  Result Value Ref Range   Glucose-Capillary 135 (H) 70 - 99 mg/dL    Comment: Glucose reference range applies only to samples taken after fasting for at least 8 hours.  Glucose, capillary     Status: Abnormal   Collection Time: 08/11/22 10:09 PM  Result Value Ref Range   Glucose-Capillary 142 (H) 70 - 99 mg/dL    Comment: Glucose reference range applies only to samples taken after fasting for at least 8 hours.  Glucose, capillary     Status: Abnormal   Collection Time: 08/12/22  6:22 AM  Result Value Ref Range   Glucose-Capillary 148 (H) 70 - 99 mg/dL    Comment: Glucose reference range applies only to samples taken after fasting for at least 8 hours.  Protime-INR     Status: None   Collection Time: 08/12/22  7:30 AM  Result Value Ref Range   Prothrombin Time 14.4 11.4 - 15.2 seconds   INR 1.1 0.8 - 1.2    Comment: (NOTE) INR goal varies based on device and disease states. Performed at [facility], [address], [place], [place] [address]   Brain natriuretic peptide     Status: Abnormal   Collection Time: 08/12/22  7:30 AM  Result Value Ref Range   B Natriuretic Peptide 950.5 (H) 0.0  - 100.0 pg/mL  Comment: Performed at [facility], [address] [place], [place] [address]  TSH     Status: None   Collection Time: 08/12/22  7:30 AM  Result Value Ref Range   TSH 1.245 0.350 - 4.500 uIU/mL    Comment: Performed by a 3rd Generation assay with a functional sensitivity of <=0.01 uIU/mL. Performed at [facility], [address], [place], [place] [address]   T4, free     Status: Abnormal   Collection Time: 08/12/22  7:30 AM  Result Value Ref Range   Free T4 1.64 (H) 0.61 - 1.12 ng/dL    Comment: (NOTE) Biotin ingestion may interfere with free T4 tests. If the results are inconsistent with the TSH level, previous test results, or the clinical presentation, then consider biotin interference. If needed, order repeat testing after stopping biotin. Performed at [facility], [address] [place], [place] [address]   Magnesium     Status: None   Collection Time: 08/12/22  7:30 AM  Result Value Ref Range   Magnesium 1.8 1.7 - 2.4 mg/dL    Comment: Performed at [facility], [address], [place], [place] [address]  Comprehensive metabolic panel     Status: Abnormal   Collection Time: 08/12/22  7:30 AM  Result Value Ref Range   Sodium 132 (L) 135 - 145 mmol/L   Potassium 4.5 3.5 - 5.1 mmol/L   Chloride 101 98 - 111 mmol/L   CO2 20 (L) 22 - 32 mmol/L   Glucose, Bld 153 (H) 70 - 99 mg/dL    Comment: Glucose reference range applies only to samples taken after fasting for at least 8 hours.   BUN 27 (H) 8 - 23 mg/dL   Creatinine, Ser 2.95 (H) 0.44 - 1.00 mg/dL   Calcium 9.1 8.9 - 62.1 mg/dL   Total Protein 6.5 6.5 - 8.1 g/dL   Albumin 3.2 (L) 3.5 - 5.0 g/dL   AST 26 15 - 41 U/L   ALT 16 0 - 44 U/L   Alkaline Phosphatase 61 38 - 126 U/L   Total Bilirubin 0.8 0.3 - 1.2 mg/dL   GFR, Estimated 47 (L) >60 mL/min    Comment: (NOTE) Calculated using the CKD-EPI Creatinine Equation (2021)    Anion gap 11 5 - 15    Comment: Performed at [facility], [address] [place], [place] [address]  Heparin level (unfractionated)     Status: Abnormal   Collection Time: 08/12/22  7:30 AM  Result Value Ref Range   Heparin Unfractionated 0.26 (L) 0.30 - 0.70 IU/mL    Comment: (NOTE) The clinical reportable range upper limit is being lowered to >1.10 to align with the FDA approved guidance for the current laboratory assay.  If heparin results are below expected values, and patient dosage has  been confirmed, suggest follow up testing of antithrombin III levels. Performed at [facility], [address], [place], [place] [address]   CBC     Status: Abnormal   Collection Time: 08/12/22  7:30 AM  Result Value Ref Range   WBC 9.5 4.0 - 10.5 K/uL   RBC 3.95 3.87 - 5.11 MIL/uL   Hemoglobin 12.0 12.0 - 15.0 g/dL   HCT 62.9 (L) 52.8 - 41.3 %   MCV 89.1 80.0 - 100.0 fL   MCH 30.4 26.0 - 34.0 pg   MCHC 34.1 30.0 - 36.0 g/dL   RDW 24.4 01.0 -  15.5 %   Platelets 233 150 - 400 K/uL   nRBC 0.0 0.0 - 0.2 %    Comment: Performed at [facility], [address], [place], [place] [address]  Glucose, capillary     Status: Abnormal   Collection Time: 08/12/22 11:44 AM  Result Value Ref Range   Glucose-Capillary 198 (H) 70 - 99 mg/dL    Comment: Glucose reference range applies only to samples taken after fasting for at least 8 hours.  Heparin level (unfractionated)     Status: None   Collection Time: 08/12/22  4:34 PM  Result Value Ref Range   Heparin Unfractionated 0.42 0.30 - 0.70 IU/mL    Comment: (NOTE) The clinical reportable range upper limit is being lowered to >1.10 to align with the FDA approved guidance for the current laboratory assay.  If heparin results are below expected values, and patient dosage has  been confirmed, suggest follow up testing of antithrombin III levels. Performed at [facility], [address] [place], [place] [address]   Glucose, capillary     Status: Abnormal   Collection Time:  08/12/22  4:45 PM  Result Value Ref Range   Glucose-Capillary 111 (H) 70 - 99 mg/dL    Comment: Glucose reference range applies only to samples taken after fasting for at least 8 hours.  Glucose, capillary     Status: Abnormal   Collection Time: 08/12/22  9:19 PM  Result Value Ref Range   Glucose-Capillary 152 (H) 70 - 99 mg/dL    Comment: Glucose reference range applies only to samples taken after fasting for at least 8 hours.   Comment 1 Notify RN    Comment 2 Document in Chart   Heparin level (unfractionated)     Status: None   Collection Time: 08/12/22 11:15 PM  Result Value Ref Range   Heparin Unfractionated 0.48 0.30 - 0.70 IU/mL    Comment: (NOTE) The clinical reportable range upper limit is being lowered to >1.10 to align with the FDA approved guidance for the current laboratory assay.  If heparin results are below expected values, and patient dosage has  been confirmed, suggest follow up testing of antithrombin III levels. Performed at [facility], [address] [place], [place] [address]   Heparin level (unfractionated)     Status: None   Collection Time: 08/13/22 12:26 AM  Result Value Ref Range   Heparin Unfractionated 0.48 0.30 - 0.70 IU/mL    Comment: (NOTE) The clinical reportable range upper limit is being lowered to >1.10 to align with the FDA approved guidance for the current laboratory assay.  If heparin results are below expected values, and patient dosage has  been confirmed, suggest follow up testing of antithrombin III levels. Performed at [facility], [address], [place], [place] [address]   CBC     Status: Abnormal   Collection Time: 08/13/22 12:26 AM  Result Value Ref Range   WBC 9.9 4.0 - 10.5 K/uL   RBC 3.76 (L) 3.87 - 5.11 MIL/uL   Hemoglobin 11.5 (L) 12.0 - 15.0 g/dL   HCT 06.2 (L) 37.6 - 28.3 %   MCV 91.5 80.0 - 100.0 fL   MCH 30.6 26.0 - 34.0 pg   MCHC 33.4 30.0 - 36.0 g/dL   RDW 15.1 76.1 - 60.7 %   Platelets 207  150 - 400 K/uL   nRBC 0.0 0.0 - 0.2 %    Comment: Performed at [facility], [address] [place], [place] [address]  Protime-INR     Status: None   Collection Time: 08/13/22 12:26 AM  Result Value Ref Range   Prothrombin Time 14.2 11.4 - 15.2 seconds   INR 1.1 0.8 - 1.2    Comment: (NOTE) INR goal varies based on device and disease states. Performed at [facility], [address] [place], [place] [address]   Glucose, capillary     Status: Abnormal   Collection Time: 08/13/22  6:19 AM  Result Value Ref Range   Glucose-Capillary 155 (H) 70 - 99 mg/dL    Comment: Glucose reference range applies only to samples taken after fasting for at least 8 hours.    ECG   N/A  Telemetry   Atrial flutter at 120 - Personally Reviewed  Radiology    DG Chest Portable 1 View  Result Date: 08/11/2022 CLINICAL DATA:  Shortness of breath for several days EXAM: PORTABLE CHEST 1 VIEW COMPARISON:  12/15/2021 FINDINGS: Cardiac shadow is mildly prominent but stable from the prior exam. Pacing device is again seen. Lungs are well aerated bilaterally. No focal infiltrate is noted. Mild central vascular congestion is seen without edema. No bony abnormality is noted. IMPRESSION: Mild vascular congestion without significant edema. Electronically Signed   By: Alcide Clever M.D.   On: 08/11/2022 11:56    Cardiac Studies   N/A  Assessment   Principal Problem:   A-fib [facility]) Active Problems:   Atrial flutter (HCC)   Cold sore   Plan   Remains in atrial flutter-  BNP was 950, but no S/S of CHF. Will restart Entresto as BP has improved. TSH normal, but free T4 was high - unknown etiology. Not on thyroid replacement at home. Continue amiodarone, warfarin and heparin. Will arrange for TEE/DCCV-  hopefully tomorrow or Thursday.  Time Spent Directly with Patient:  I have spent a total of 25 minutes with the patient reviewing hospital notes, telemetry, EKGs, labs and examining the patient as  well as establishing an assessment and plan that was discussed personally with the patient.  > 50% of time was spent in direct patient care.  Length of Stay:  LOS: 2 days   Chrystie Nose, MD, [facility], FACP  [place]  [facility]  Medical Director of the Advanced Lipid Disorders &  Cardiovascular Risk Reduction Clinic Diplomate of the American Board of Clinical Lipidology Attending Cardiologist  Direct Dial: [phone]  Fax: [phone]  Website:  [email or web address]  Lisette Abu Jacquez Sheetz 08/13/2022, 8:29 AM

## 2022-08-13 NOTE — Progress Notes (Signed)
ANTICOAGULATION CONSULT NOTE - Follow Up Consult  Pharmacy Consult for IV heparin Indication: atrial fibrillation  No Known Allergies  Patient Measurements: Height: 5\' 4"  (162.6 cm) Weight: 71.2 kg (156 lb 15.5 oz) IBW/kg (Calculated) : 54.7 Heparin Dosing Weight: 68.8 kg  Vital Signs: Temp: 98.1 F (36.7 C) (06/24 2359) Temp Source: Oral (06/24 2359) BP: 122/86 (06/24 2359) Pulse Rate: 95 (06/24 2359)  Labs: Recent Labs    08/11/22 1206 08/12/22 0730 08/12/22 1634 08/12/22 2315  HGB 11.9* 12.0  --   --   HCT 34.2* 35.2*  --   --   PLT 224 233  --   --   LABPROT  --  14.4  --   --   INR  --  1.1  --   --   HEPARINUNFRC  --  0.26* 0.42 0.48  CREATININE 1.32* 1.23*  --   --     Assessment: 72 yo female presented w/ shortness of breath. PMH includes HF, mitral stenosis, and pacemaker placement.; has new diagnosis of Afib and was to be seen in Afib Clinic on 6/24. No anticoagulation PTA. CHADS-VASc = 5. Pharmacy consulted to dose IV heparin.   Heparin level therapeutic 0.48 on  1150 units/hr -no issue with line/infusion and no s/sx of bleeding per RN  Goal of Therapy:  Heparin level 0.3-0.7 units/ml INR 2.0-3.0 Monitor platelets by anticoagulation protocol: Yes   Plan:  Continue heparin drip to 1150 units/hr Heparin level in 8 hours to confirm therapeutic Daily heparin level, CBC, INR  Ruben Im, PharmD Clinical Pharmacist 08/13/2022 1:20 AM Please check AMION for all [facility] numbers

## 2022-08-13 NOTE — Progress Notes (Signed)
ANTICOAGULATION CONSULT NOTE - Follow Up Consult  Pharmacy Consult for IV heparin >> warfarin Indication: atrial fibrillation with rheumatic mitral stenosis  No Known Allergies  Patient Measurements: Height: 5\' 4"  (162.6 cm) Weight: 69.2 kg (152 lb 8.9 oz) IBW/kg (Calculated) : 54.7 Heparin Dosing Weight: 68.8 kg  Vital Signs: Temp: 98.2 F (36.8 C) (06/25 0836) Temp Source: Oral (06/25 0410) BP: 126/77 (06/25 0836) Pulse Rate: 119 (06/25 0836)  Labs: Recent Labs    08/11/22 1206 08/11/22 1206 08/12/22 0730 08/12/22 1634 08/12/22 2315 08/13/22 0026  HGB 11.9*  --  12.0  --   --  11.5*  HCT 34.2*  --  35.2*  --   --  34.4*  PLT 224  --  233  --   --  207  LABPROT  --   --  14.4  --   --  14.2  INR  --   --  1.1  --   --  1.1  HEPARINUNFRC  --    < > 0.26* 0.42 0.48 0.48  CREATININE 1.32*  --  1.23*  --   --   --    < > = values in this interval not displayed.    Assessment: 72 yo female presented w/ shortness of breath. PMH includes HF, mitral stenosis, and pacemaker placement.; has new diagnosis of Afib and was to be seen in Afib Clinic on 6/24. No anticoagulation PTA. CHADS-VASc = 5. Pharmacy consulted to dose IV heparin >> warfarin.   Heparin level therapeutic 0.48 on 1150 units/hr INR 1.1, subtherapeutic as expected after first warfarin dose  Goal of Therapy:  Heparin level 0.3-0.7 units/ml INR 2.0-3.0 Monitor platelets by anticoagulation protocol: Yes   Plan:  Continue heparin infusion at 1150 units/hr Warfarin 7.5mg  PO x 1 tonight Daily heparin level, CBC, INR  [employer], Pharm.D., BCPS Clinical Pharmacist Clinical phone for 08/13/2022 from 7:30-3:00 is [phone].  **Pharmacist phone directory can be found on [email or web address] listed under [facility].  08/13/2022 8:39 AM

## 2022-08-13 NOTE — Telephone Encounter (Signed)
noted 

## 2022-08-13 NOTE — TOC Initial Note (Signed)
Transition of Care [facility]) - Initial/Assessment Note    Patient Details  Name: Carol Richmond MRN: [id number] Date of Birth: 08-Jan-1951  Transition of Care [facility]) CM/SW Contact:    Leone Haven, RN Phone Number: 08/13/2022, 12:55 PM  Clinical Narrative:                 From home alone, indep, she has PCP, Harlow Mares, she has insurance on file.  She does not currently have any HH services in place. She has a cane, and two walkers at home.  She states her daughter, Selena Batten or [name] will transport her home at dc.  They are both her support system.  She gets her medications from [facility] in [place].    Expected Discharge Plan: Home/Self Care Barriers to Discharge: Continued Medical Work up   Patient Goals and CMS Choice     Choice offered to / list presented to : NA      Expected Discharge Plan and Services In-house Referral: NA Discharge Planning Services: CM Consult Post Acute Care Choice: NA Living arrangements for the past 2 months: Single Family Home                 DME Arranged: N/A DME Agency: NA       HH Arranged: NA          Prior Living Arrangements/Services Living arrangements for the past 2 months: Single Family Home Lives with:: Self Patient language and need for interpreter reviewed:: Yes Do you feel safe going back to the place where you live?: Yes        Care giver support system in place?: Yes (comment) Current home services: DME (cane, walker x 2,) Criminal Activity/Legal Involvement Pertinent to Current Situation/Hospitalization: No - Comment as needed  Activities of Daily Living      Permission Sought/Granted Permission sought to share information with : Case Manager                Emotional Assessment   Attitude/Demeanor/Rapport: Engaged Affect (typically observed): Appropriate Orientation: : Oriented to Self, Oriented to Place, Oriented to  Time, Oriented to Situation Alcohol / Substance Use: Not Applicable Psych  Involvement: No (comment)  Admission diagnosis:  A-fib (HCC) [I48.91] New onset atrial fibrillation (HCC) [I48.91] Atrial flutter (HCC) [I48.92] Patient Active Problem List   Diagnosis Date Noted   Cold sore 08/12/2022   A-fib (HCC) 08/11/2022   Atrial flutter (HCC) 08/11/2022   Encounter to discuss test results 08/07/2022   Hyperlipidemia associated with type 2 diabetes mellitus (HCC) 07/22/2022   Lymphadenopathy 07/10/2022   Burning tongue syndrome 03/28/2022   S/P placement of cardiac pacemaker 12/28/2021   Heart block AV complete (HCC) 12/15/2021   LBBB (left bundle branch block) 12/15/2021   Controlled substance agreement signed 12/03/2021   Mitral stenosis with regurgitation 09/30/2021   Wide-complex tachycardia 09/30/2021   SVT (supraventricular tachycardia) 09/28/2021   Chronic HFrEF (heart failure with reduced ejection fraction) (HCC) 09/28/2021   Moderate nonproliferative diabetic retinopathy of left eye, associated with type 2 diabetes mellitus (HCC) 09/20/2021   Posterior vitreous detachment of left eye 09/20/2021   Stable central retinal vein occlusion of right eye 09/20/2021   Hypertensive retinopathy of both eyes, grade 1 09/20/2021   Nuclear sclerotic cataract of left eye 09/20/2021   Benzodiazepine dependence (HCC) 05/15/2021   Angina, class III (HCC) 06/27/2020   Cirrhosis, nonalcoholic (HCC) 03/28/2020   Cervical spondylosis 06/07/2019   Peptic ulcer disease 01/25/2019   Iron deficiency  anemia 01/25/2019   Fatty liver disease, nonalcoholic 01/25/2019   Cardiomyopathy (HCC) 08/07/2017   Non-rheumatic mitral regurgitation 08/07/2017   Non-rheumatic tricuspid valve insufficiency 08/07/2017   PAD (peripheral artery disease) (HCC) 08/07/2017   Type 2 diabetes mellitus with hyperglycemia, without long-term current use of insulin (HCC) 08/07/2017   Carotid artery stenosis, symptomatic, right 07/01/2017   Stenosis of right carotid artery 06/23/2017   Orthostatic  tremor 06/23/2017   Generalized anxiety disorder 07/01/2016   Mild neurocognitive disorder 05/02/2016   Migraine without aura and without status migrainosus, not intractable 01/27/2015   Depression, recurrent (HCC) 07/25/2014   Chronic daily headache 12/03/2013   Osteoarthritis of right knee 09/03/2012   Right knee DJD    IBS (irritable bowel syndrome)    Diverticulosis of colon (without mention of hemorrhage)    GERD (gastroesophageal reflux disease)    Hypertension associated with type 2 diabetes mellitus (HCC)    Partial seizure (HCC)    Hemorrhoids, external, without mention of complication    PCP:  Gabriel Earing, FNP Pharmacy:   [facility] - [place], [place] - [address] [place] [address] Phone: [phone] Fax: [phone]  [facility] [place], [place] - [address] [place] [address] Phone: [phone] Fax: [phone]     Social Determinants of Health (SDOH) Social History: SDOH Screenings   Food Insecurity: No Food Insecurity (01/04/2022)  Housing: Low Risk  (01/04/2022)  Transportation Needs: No Transportation Needs (01/04/2022)  Utilities: Not At Risk (01/04/2022)  Alcohol Screen: Low Risk  (07/05/2021)  Depression (PHQ2-9): Low Risk  (08/07/2022)  Financial Resource Strain: Low Risk  (01/04/2022)  Physical Activity: Sufficiently Active (01/04/2022)  Social Connections: Moderately Integrated (01/04/2022)  Stress: No Stress Concern Present (01/04/2022)  Tobacco Use: Low Risk  (08/11/2022)   SDOH Interventions:     Readmission Risk Interventions     No data to display

## 2022-08-14 ENCOUNTER — Encounter (HOSPITAL_COMMUNITY)
Admission: EM | Disposition: A | Discharge status: Home / Self Care | Payer: Self-pay | Source: Home / Self Care | Attending: Internal Medicine

## 2022-08-14 ENCOUNTER — Inpatient Hospital Stay (HOSPITAL_COMMUNITY): Payer: Medicare HMO

## 2022-08-14 ENCOUNTER — Encounter (HOSPITAL_COMMUNITY): Payer: Self-pay | Admitting: Internal Medicine

## 2022-08-14 ENCOUNTER — Other Ambulatory Visit (HOSPITAL_COMMUNITY): Payer: Self-pay

## 2022-08-14 ENCOUNTER — Inpatient Hospital Stay (HOSPITAL_COMMUNITY): Payer: Medicare HMO | Admitting: Certified Registered Nurse Anesthetist

## 2022-08-14 DIAGNOSIS — F418 Other specified anxiety disorders: Secondary | ICD-10-CM

## 2022-08-14 DIAGNOSIS — R9431 Abnormal electrocardiogram [ECG] [EKG]: Secondary | ICD-10-CM | POA: Diagnosis not present

## 2022-08-14 DIAGNOSIS — I110 Hypertensive heart disease with heart failure: Secondary | ICD-10-CM

## 2022-08-14 DIAGNOSIS — I340 Nonrheumatic mitral (valve) insufficiency: Secondary | ICD-10-CM

## 2022-08-14 DIAGNOSIS — I4892 Unspecified atrial flutter: Secondary | ICD-10-CM

## 2022-08-14 DIAGNOSIS — E1151 Type 2 diabetes mellitus with diabetic peripheral angiopathy without gangrene: Secondary | ICD-10-CM

## 2022-08-14 DIAGNOSIS — I4819 Other persistent atrial fibrillation: Secondary | ICD-10-CM

## 2022-08-14 DIAGNOSIS — I342 Nonrheumatic mitral (valve) stenosis: Secondary | ICD-10-CM

## 2022-08-14 DIAGNOSIS — I4891 Unspecified atrial fibrillation: Secondary | ICD-10-CM

## 2022-08-14 DIAGNOSIS — I5022 Chronic systolic (congestive) heart failure: Secondary | ICD-10-CM | POA: Diagnosis not present

## 2022-08-14 DIAGNOSIS — I509 Heart failure, unspecified: Secondary | ICD-10-CM

## 2022-08-14 HISTORY — PX: CARDIOVERSION: SHX1299

## 2022-08-14 HISTORY — PX: TEE WITHOUT CARDIOVERSION: SHX5443

## 2022-08-14 LAB — HEMOGLOBIN A1C
Hgb A1c MFr Bld: 6.3 % — ABNORMAL HIGH (ref 4.8–5.6)
Mean Plasma Glucose: 134 mg/dL

## 2022-08-14 LAB — GLUCOSE, CAPILLARY
Glucose-Capillary: 141 mg/dL — ABNORMAL HIGH (ref 70–99)
Glucose-Capillary: 139 mg/dL — ABNORMAL HIGH (ref 70–99)
Glucose-Capillary: 139 mg/dL — ABNORMAL HIGH (ref 70–99)
Glucose-Capillary: 132 mg/dL — ABNORMAL HIGH (ref 70–99)

## 2022-08-14 LAB — BASIC METABOLIC PANEL
Anion gap: 10 (ref 5–15)
BUN: 17 mg/dL (ref 8–23)
CO2: 21 mmol/L — ABNORMAL LOW (ref 22–32)
Calcium: 8.4 mg/dL — ABNORMAL LOW (ref 8.9–10.3)
Chloride: 98 mmol/L (ref 98–111)
Creatinine, Ser: 1.19 mg/dL — ABNORMAL HIGH (ref 0.44–1.00)
GFR, Estimated: 49 mL/min — ABNORMAL LOW (ref >60)
Glucose, Bld: 164 mg/dL — ABNORMAL HIGH (ref 70–99)
Potassium: 3.9 mmol/L (ref 3.5–5.1)
Sodium: 129 mmol/L — ABNORMAL LOW (ref 135–145)

## 2022-08-14 LAB — CBC
HCT: 33.1 % — ABNORMAL LOW (ref 36.0–46.0)
Hemoglobin: 11.3 g/dL — ABNORMAL LOW (ref 12.0–15.0)
MCH: 30.4 pg (ref 26.0–34.0)
MCHC: 34.1 g/dL (ref 30.0–36.0)
MCV: 89 fL (ref 80.0–100.0)
Platelets: 222 10*3/uL (ref 150–400)
RBC: 3.72 MIL/uL — ABNORMAL LOW (ref 3.87–5.11)
RDW: 13.3 % (ref 11.5–15.5)
WBC: 11.4 10*3/uL — ABNORMAL HIGH (ref 4.0–10.5)
nRBC: 0 % (ref 0.0–0.2)

## 2022-08-14 LAB — ECHO TEE
MV M vel: 3.45 m/s
MV Peak grad: 47.5 mmHg
Radius: 0.6 cm

## 2022-08-14 LAB — PROTIME-INR
INR: 1.2 (ref 0.8–1.2)
Prothrombin Time: 15.5 seconds — ABNORMAL HIGH (ref 11.4–15.2)

## 2022-08-14 LAB — HEPARIN LEVEL (UNFRACTIONATED): Heparin Unfractionated: 0.5 IU/mL (ref 0.30–0.70)

## 2022-08-14 SURGERY — ECHOCARDIOGRAM, TRANSESOPHAGEAL
Anesthesia: General

## 2022-08-14 MED ORDER — INSULIN ASPART 100 UNIT/ML IJ SOLN: 0.0000 [IU] | Freq: Every day | INTRAMUSCULAR | Status: DC

## 2022-08-14 MED ORDER — PHENYLEPHRINE 80 MCG/ML (10ML) SYRINGE FOR IV PUSH (FOR BLOOD PRESSURE SUPPORT)
PREFILLED_SYRINGE | INTRAVENOUS | Status: DC | PRN
  Administered 2022-08-14: 160 ug via INTRAVENOUS
  Administered 2022-08-14: 80 ug via INTRAVENOUS
  Administered 2022-08-14: 40 ug via INTRAVENOUS
  Administered 2022-08-14: 80 ug via INTRAVENOUS
  Administered 2022-08-14 (×2): 120 ug via INTRAVENOUS

## 2022-08-14 MED ORDER — INSULIN ASPART 100 UNIT/ML IJ SOLN: 0.0000 [IU] | Freq: Three times a day (TID) | INTRAMUSCULAR | Status: DC

## 2022-08-14 MED ORDER — AMIODARONE HCL 200 MG PO TABS
400.0000 mg | ORAL_TABLET | Freq: Two times a day (BID) | ORAL | Status: DC
  Administered 2022-08-14 – 2022-08-15 (×3): 400 mg via ORAL
  Filled 2022-08-14 (×3): qty 2

## 2022-08-14 MED ORDER — WARFARIN SODIUM 7.5 MG PO TABS
7.5000 mg | ORAL_TABLET | Freq: Once | ORAL | Status: AC
  Administered 2022-08-14: 7.5 mg via ORAL
  Filled 2022-08-14: qty 1

## 2022-08-14 MED ORDER — PROPOFOL 500 MG/50ML IV EMUL
INTRAVENOUS | Status: DC | PRN
  Administered 2022-08-14: 75 ug/kg/min via INTRAVENOUS

## 2022-08-14 SURGICAL SUPPLY — 1 items: ELECT DEFIB PAD ADLT CADENCE (PAD) ×1 IMPLANT

## 2022-08-14 NOTE — Progress Notes (Signed)
Mobility Specialist Progress Note:    08/14/22 1616  Mobility  Activity Transferred from bed to chair  Level of Assistance Contact guard assist, steadying assist  Assistive Device Other (Comment) (HHA)  Activity Response Tolerated well  Mobility Referral Yes  $Mobility charge 1 Mobility  Mobility Specialist Start Time (ACUTE ONLY) 1545  Mobility Specialist Stop Time (ACUTE ONLY) 1557  Mobility Specialist Time Calculation (min) (ACUTE ONLY) 12 min   Pt received in bed, declining mobility d/t c/o nausea and chest pain, but agreeable to transfer to chair. Pt needed no physical assistance throughout session. Pt situated in chair with call bell and personal belongings at hand. All needs met.  Thompson Grayer Mobility Specialist  Please contact vis Secure Chat or  Rehab Office [phone]

## 2022-08-14 NOTE — Anesthesia Preprocedure Evaluation (Signed)
Anesthesia Evaluation  Patient identified by MRN, date of birth, ID band Patient awake    Reviewed: Allergy & Precautions, NPO status , Patient's Chart, lab work & pertinent test results  Airway Mallampati: III  TM Distance: >3 FB Neck ROM: Full    Dental  (+) Dental Advisory Given, Edentulous Upper, Edentulous Lower   Pulmonary shortness of breath, asthma    breath sounds clear to auscultation       Cardiovascular hypertension, Pt. on medications (-) angina + Peripheral Vascular Disease and +CHF  (-) Past MI and (-) Cardiac Stents + dysrhythmias + pacemaker  Rhythm:Irregular   1. Left ventricular ejection fraction, by estimation, is 40 to 45%. The  left ventricle has mildly decreased function. The left ventricle has no  regional wall motion abnormalities. Left ventricular diastolic function  could not be evaluated.   2. Right ventricular systolic function is normal. The right ventricular  size is normal. Tricuspid regurgitation signal is inadequate for assessing  PA pressure.   3. Left atrial size was severely dilated. No left atrial/left atrial  appendage thrombus was detected. The LAA emptying velocity was 57 cm/s.   4. Mitral valve area is 1.8 cm sq by PHT method and 1.68 cm sq by 3D  guided planimetry. Effective regurgitant orifice area is 0.34 cm sq by  both 3D guided planimetry and by the PISA method, regurgitant volume 35  ml, regurgitant fraction 33%. There is  systolic dominant forward flow in the pulmonary veins. The mitral valve is  not amenable to valvuloplasty due to the severity of the mitral  insufficiency and due to severe and asymmetrical focal calcification in  the mid anterior leaflet. The mitral valve  is rheumatic. Moderate mitral valve regurgitation. Moderate mitral  stenosis. The mean mitral valve gradient is 5.5 mmHg with average heart  rate of 77 bpm.   5. The aortic valve is tricuspid. Aortic valve  regurgitation is trivial.  No aortic stenosis is present.   6. There is Moderate (Grade III) atheroma plaque involving the descending  aorta.      Neuro/Psych  Headaches, Seizures -, Well Controlled,  PSYCHIATRIC DISORDERS Anxiety Depression       GI/Hepatic Neg liver ROS, PUD,GERD  ,,  Endo/Other  diabetes, Type 2    Renal/GU Renal InsufficiencyRenal diseaseLab Results      Component                Value               Date                      CREATININE               1.19 (H)            08/14/2022                Musculoskeletal  (+) Arthritis ,    Abdominal   Peds  Hematology  (+) Blood dyscrasia, anemia Lab Results      Component                Value               Date                      WBC  11.4 (H)            08/14/2022                HGB                      11.3 (L)            08/14/2022                HCT                      33.1 (L)            08/14/2022                MCV                      89.0                08/14/2022                PLT                      222                 08/14/2022            asa   Anesthesia Other Findings   Reproductive/Obstetrics                             Anesthesia Physical Anesthesia Plan  ASA: 3  Anesthesia Plan: MAC and General   Post-op Pain Management: Minimal or no pain anticipated   Induction: Intravenous  PONV Risk Score and Plan: 3 and Propofol infusion and Treatment may vary due to age or medical condition  Airway Management Planned: Nasal Cannula, Natural Airway and Simple Face Mask  Additional Equipment: None  Intra-op Plan:   Post-operative Plan:   Informed Consent: I have reviewed the patients History and Physical, chart, labs and discussed the procedure including the risks, benefits and alternatives for the proposed anesthesia with the patient or authorized representative who has indicated his/her understanding and acceptance.     Dental advisory  given  Plan Discussed with: CRNA  Anesthesia Plan Comments:        Anesthesia Quick Evaluation

## 2022-08-14 NOTE — Progress Notes (Signed)
ANTICOAGULATION CONSULT NOTE - Follow Up Consult  Pharmacy Consult for IV heparin >> warfarin Indication: atrial fibrillation with rheumatic mitral stenosis  No Known Allergies  Patient Measurements: Height: 5\' 4"  (162.6 cm) Weight: 70.6 kg (155 lb 10.3 oz) IBW/kg (Calculated) : 54.7 Heparin Dosing Weight: 68.8 kg  Vital Signs: Temp: 98.1 F (36.7 C) (06/26 0258) Temp Source: Oral (06/26 0258) BP: 96/75 (06/26 0258) Pulse Rate: 125 (06/26 0258)  Labs: Recent Labs    08/11/22 1206 08/12/22 0730 08/12/22 1634 08/12/22 2315 08/13/22 0026 08/14/22 0041  HGB 11.9* 12.0  --   --  11.5* 11.3*  HCT 34.2* 35.2*  --   --  34.4* 33.1*  PLT 224 233  --   --  207 222  LABPROT  --  14.4  --   --  14.2 15.5*  INR  --  1.1  --   --  1.1 1.2  HEPARINUNFRC  --  0.26*   < > 0.48 0.48 0.50  CREATININE 1.32* 1.23*  --   --   --  1.19*   < > = values in this interval not displayed.    Assessment: 72 yo female presented w/ shortness of breath. PMH includes HF, mitral stenosis, and pacemaker placement.; has new diagnosis of Afib and was to be seen in [facility] Clinic on 6/24. No anticoagulation PTA. CHADS-VASc = 5. Pharmacy consulted to dose IV heparin >> warfarin.   Heparin level is therapeutic at 0.5, on heparin infusion at 1150 units/hr. Hgb 11.3, plt 222. No s/sx of bleeding or infusion issues per RN. INR is subtherapeutic at 1.2- warfarin restarted on 6/24, anticipate to start seeing full effect of warfarin.   Goal of Therapy:  Heparin level 0.3-0.7 units/ml INR 2.0-3.0 Monitor platelets by anticoagulation protocol: Yes   Plan:  Continue heparin infusion at 1150 units/hr Warfarin 7.5mg  PO x 1 tonight Daily heparin level, CBC, INR  Thank you for allowing pharmacy to participate in this patient's care,  Sherron Monday, PharmD, BCCCP Clinical Pharmacist  Phone: [phone] 08/14/2022 7:45 AM  Please check AMION for all [facility] Pharmacy phone numbers After 10:00 PM, call Main Pharmacy  [phone]

## 2022-08-14 NOTE — TOC Benefit Eligibility Note (Signed)
Pharmacy Patient Advocate Encounter  Insurance verification completed.    The patient is insured through [employer] Medicare Part D  Ran test claim for enoxaparin (Lovenox) 80 mg/0.8 ml injection and the current 5 day co-pay is $95.00.   This test claim was processed through [facility]- copay amounts may vary at other pharmacies due to pharmacy/plan contracts, or as the patient moves through the different stages of their insurance plan.    Roland Earl, CPHT Pharmacy Patient Advocate Specialist [facility] Patient Advocate Team Direct Number: [phone]  Fax: [phone]

## 2022-08-14 NOTE — Care Management Important Message (Signed)
Important Message  Patient Details  Name: Carol Richmond MRN: [id number] Date of Birth: Jul 29, 1950   Medicare Important Message Given:  Yes     Renie Ora 08/14/2022, 8:01 AM

## 2022-08-14 NOTE — Progress Notes (Signed)
  Echocardiogram Echocardiogram Transesophageal has been performed.  Janalyn Harder 08/14/2022, 11:00 AM

## 2022-08-14 NOTE — Interval H&P Note (Signed)
History and Physical Interval Note:  08/14/2022 9:52 AM  Carol Richmond  has presented today for surgery, with the diagnosis of aflutter.  The various methods of treatment have been discussed with the patient and family. After consideration of risks, benefits and other options for treatment, the patient has consented to  Procedure(s): TRANSESOPHAGEAL ECHOCARDIOGRAM (N/A) CARDIOVERSION (N/A) as a surgical intervention.  The patient's history has been reviewed, patient examined, no change in status, stable for surgery.  I have reviewed the patient's chart and labs.  Questions were answered to the patient's satisfaction.     [name]

## 2022-08-14 NOTE — Progress Notes (Addendum)
Patient Name: Carol Richmond Date of Encounter: 08/14/2022 [facility] Cardiologist: Nona Dell, MD   Interval Summary  .    Patient seen post TEE/DCCV. Feeling tired but better post conversion. No chest pain or shortness of breath.  Vital Signs .    Vitals:   08/13/22 2053 08/13/22 2350 08/14/22 0258 08/14/22 0847  BP: 120/69 112/82 96/75 (!) 93/57  Pulse: (!) 118 (!) 107 (!) 125 (!) 125  Resp: (!) 22 20 20 18   Temp: 98.4 F (36.9 C) 97.9 F (36.6 C) 98.1 F (36.7 C)   TempSrc: Oral Oral Oral   SpO2: 95% 98% 98% 95%  Weight:   70.6 kg   Height:        Intake/Output Summary (Last 24 hours) at 08/14/2022 0938 Last data filed at 08/14/2022 0300 Gross per 24 hour  Intake 2244.76 ml  Output --  Net 2244.76 ml      08/14/2022    2:58 AM 08/13/2022    4:10 AM 08/12/2022    3:39 AM  Last 3 Weights  Weight (lbs) 155 lb 10.3 oz 152 lb 8.9 oz 156 lb 15.5 oz  Weight (kg) 70.6 kg 69.2 kg 71.2 kg      Telemetry/ECG    Afib/flutter with ventricular rates in the 120s. Now sinus rhythm/paced rhythm post conversion- Personally Reviewed  Physical Exam .   GEN: No acute distress.   Neck: No JVD Cardiac: RRR, no murmurs, rubs, or gallops.  Respiratory: Clear to auscultation bilaterally. GI: Soft, nontender, non-distended  MS: No edema  Assessment & Plan .     Atrial fibrillation/flutter Hx AVNRT s/p ablation CHB, s/p PPM   Patient presented with dyspnea and fatigue, found with elevated HR on presentation to the ED, suspicious for atrial flutter. She was initiated on Amiodarone and transferred to [facility].   This morning, patient in afib/flutter with ventricular rates in the 120s. TEE/DCCV today with successful restoration of sinus rhythm Convert from IV Amiodarone to oral. Patient has had approximately 2g load thus far, will plan on Amiodarone 400mg  BID x10 days then 200mg  daily. Continue Warfarin (mitral valve disease) with heparin bridge. INR 1.2,  will need Lovenox bridge in order to expedite discharge following cardioversion.   Chronic HFmrEF  LVEF 40-45% per 03/01/22 TEE. No evidence of acute CHF this admission. Continue Entresto 24-26mg  BID, Metoprolol Tartrate 12.5mg  BID. Currently no room to add Spironolactone 12.5mg  at this time given lower BP. Patient has previously been on Jardiance, stopped in January due to cost.   Rheumatic mitral valve disease Moderate to severe mitral valve stenosis  03/01/22 TEE showing regurgitant fraction 33%, moderate MV regurgitation with moderate mitral valve stenosis. This study showed that valve not amenable to valvuloplasty due to severity of insufficiency and due to severe and asymmetrical focal calcification in mid anterior leaflet.  Hypertension  Fluctuant BP this admission. BP meds as above in HR.  Carotid artery disease  Last carotid US showed both left and right carotid arteries with 1-39% stenosis. Although patient with prior TIA, with new need for [facility], would favor stopping ASA.   DM type II  Patient on metformin PTA. Will use conservative SSI while admitted to prevent hyperglycemia. Last A1c 5.8.  For questions or updates, please contact [facility] Please consult [email or web address] for contact info under        Signed, Perlie Gold, PA-C  Pt. Seen and examined. Agree with the Resident/NP/PA-C note as written. Seen after cardioversion today- discussed  with Dr. Wyline Mood - successful DCCV to a paced rhythm. Will continue oral amiodarone loading. Noted high free T4 (but normal TSH) prior to starting amio. Will need repeat thyroid labs in 3 months. Working towards a therapeutic INR.  Plan possible d/c tomorrow with lovenox bridge.  Time Spent with Patient: I have spent a total of 25 minutes with patient reviewing hospital notes, telemetry, EKGs, labs and examining the patient as well as establishing an assessment and plan that was discussed with the patient.  > 50% of time was  spent in direct patient care.  Chrystie Nose, MD, [facility], FACP  [place]  [facility]  Medical Director of the Advanced Lipid Disorders &  Cardiovascular Risk Reduction Clinic Diplomate of the American Board of Clinical Lipidology Attending Cardiologist  Direct Dial: [phone]  Fax: [phone]  Website:  [email or web address]

## 2022-08-14 NOTE — Discharge Instructions (Signed)
Information on my medicine - Coumadin?   (Warfarin) ? ?Why was Coumadin prescribed for you? ?Coumadin was prescribed for you because you have a blood clot or a medical condition that can cause an increased risk of forming blood clots. Blood clots can cause serious health problems by blocking the flow of blood to the heart, lung, or brain. Coumadin can prevent harmful blood clots from forming. ?As a reminder your indication for Coumadin is:  Stroke Prevention because of Atrial Fibrillation ? ?What test will check on my response to Coumadin? ?While on Coumadin (warfarin) you will need to have an INR test regularly to ensure that your dose is keeping you in the desired range. The INR (international normalized ratio) number is calculated from the result of the laboratory test called prothrombin time (PT). ? ?If an INR APPOINTMENT HAS NOT ALREADY BEEN MADE FOR YOU please schedule an appointment to have this lab work done by your health care provider within 7 days. ?Your INR goal is usually a number between:  2 to 3 or your provider may give you a more narrow range like 2-2.5.  Ask your health care provider during an office visit what your goal INR is. ? ?What  do you need to  know  About  COUMADIN? ?Take Coumadin (warfarin) exactly as prescribed by your healthcare provider about the same time each day.  DO NOT stop taking without talking to the doctor who prescribed the medication.  Stopping without other blood clot prevention medication to take the place of Coumadin may increase your risk of developing a new clot or stroke.  Get refills before you run out. ? ?What do you do if you miss a dose? ?If you miss a dose, take it as soon as you remember on the same day then continue your regularly scheduled regimen the next day.  Do not take two doses of Coumadin at the same time. ? ?Important Safety Information ?A possible side effect of Coumadin (Warfarin) is an increased risk of bleeding. You should call your healthcare  provider right away if you experience any of the following: ?Bleeding from an injury or your nose that does not stop. ?Unusual colored urine (red or dark brown) or unusual colored stools (red or black). ?Unusual bruising for unknown reasons. ?A serious fall or if you hit your head (even if there is no bleeding). ? ?Some foods or medicines interact with Coumadin? (warfarin) and might alter your response to warfarin. To help avoid this: ?Eat a balanced diet, maintaining a consistent amount of Vitamin K. ?Notify your provider about major diet changes you plan to make. ?Avoid alcohol or limit your intake to 1 drink for women and 2 drinks for men per day. ?(1 drink is 5 oz. wine, 12 oz. beer, or 1.5 oz. liquor.) ? ?Make sure that ANY health care provider who prescribes medication for you knows that you are taking Coumadin (warfarin).  Also make sure the healthcare provider who is monitoring your Coumadin knows when you have started a new medication including herbals and non-prescription products. ? ?Coumadin? (Warfarin)  Major Drug Interactions  ?Increased Warfarin Effect Decreased Warfarin Effect  ?Alcohol (large quantities) ?Antibiotics (esp. Septra/Bactrim, Flagyl, Cipro) ?Amiodarone (Cordarone) ?Aspirin (ASA) ?Cimetidine (Tagamet) ?Megestrol (Megace) ?NSAIDs (ibuprofen, naproxen, etc.) ?Piroxicam (Feldene) ?Propafenone (Rythmol SR) ?Propranolol (Inderal) ?Isoniazid (INH) ?Posaconazole (Noxafil) Barbiturates (Phenobarbital) ?Carbamazepine (Tegretol) ?Chlordiazepoxide (Librium) ?Cholestyramine (Questran) ?Griseofulvin ?Oral Contraceptives ?Rifampin ?Sucralfate (Carafate) ?Vitamin K  ? ?Coumadin? (Warfarin) Major Herbal Interactions  ?Increased Warfarin Effect Decreased Warfarin Effect  ?  Garlic ?Ginseng ?Ginkgo biloba Coenzyme Q10 ?Green tea ?St. John?s wort   ? ?Coumadin? (Warfarin) FOOD Interactions  ?Eat a consistent number of servings per week of foods HIGH in Vitamin K ?(1 serving = ? cup)  ?Collards (cooked, or  boiled & drained) ?Kale (cooked, or boiled & drained) ?Mustard greens (cooked, or boiled & drained) ?Parsley *serving size only = ? cup ?Spinach (cooked, or boiled & drained) ?Swiss chard (cooked, or boiled & drained) ?Turnip greens (cooked, or boiled & drained)  ?Eat a consistent number of servings per week of foods MEDIUM-HIGH in Vitamin K ?(1 serving = 1 cup)  ?Asparagus (cooked, or boiled & drained) ?Broccoli (cooked, boiled & drained, or raw & chopped) ?Brussel sprouts (cooked, or boiled & drained) *serving size only = ? cup ?Lettuce, raw (green leaf, endive, romaine) ?Spinach, raw ?Turnip greens, raw & chopped  ? ?These websites have more information on Coumadin (warfarin):  www.coumadin.com; ?www.ahrq.gov/consumer/coumadin.htm; ? ? ?

## 2022-08-14 NOTE — CV Procedure (Signed)
INDICATIONS: Atrial Fibrillation   PROCEDURE:   Informed consent was obtained prior to the procedure. The risks, benefits and alternatives for the procedure were discussed and the patient comprehended these risks.  Risks include, but are not limited to, cough, sore throat, vomiting, nausea, somnolence, esophageal and stomach trauma or perforation, bleeding, low blood pressure, aspiration, pneumonia, infection, trauma to the teeth and death.    After a procedural time-out, the oropharynx was anesthetized with 20% benzocaine spray.   During this procedure the patient was administered propofol, see anesthesia note, to achieve and maintain moderate conscious sedation.  The patient's heart rate, blood pressure, and oxygen saturationweare monitored continuously during the procedure. The period of conscious sedation was 15 minutes, of which I was present face-to-face 100% of this time.  The transesophageal probe was inserted in the esophagus and stomach without difficulty and multiple views were obtained.  The patient was kept under observation until the patient left the procedure room.  The patient left the procedure room in stable condition.   Agitated microbubble saline contrast was not administered.  COMPLICATIONS:    There were no immediate complications.  FINDINGS:  Reduced LV function Moderate MS, MR; rheumatic mitral valve No LAA thrombus  RECOMMENDATIONS:     Continue anticoagulation  Time Spent Directly with the Patient:  20 minutes   Procedure: Electrical Cardioversion Indications:  Atrial Fibrillation  Procedure Details:  Consent: Risks of procedure as well as the alternatives and risks of each were explained to the (patient/caregiver).  Consent for procedure obtained.  Time Out: Verified patient identification, verified procedure, site/side was marked, verified correct patient position, special equipment/implants available, medications/allergies/relevent history reviewed,  required imaging and test results available. PERFORMED.  Patient placed on cardiac monitor, pulse oximetry, supplemental oxygen as necessary.  Sedation given: propofol, see anesthesia note  Pacer pads placed anterior and posterior chest.  Cardioverted 1 time(s).  Cardioversion with synchronized biphasic 200J shock.  Evaluation: Findings: Post procedure EKG shows:  A sensed V paced Complications: None Patient did tolerate procedure well.  Time Spent Directly with the Patient:  20 minutes   [name] 08/14/2022, 10:50 AM

## 2022-08-14 NOTE — Transfer of Care (Signed)
Immediate Anesthesia Transfer of Care Note  Patient: [name]  Procedure(s) Performed: TRANSESOPHAGEAL ECHOCARDIOGRAM CARDIOVERSION  Patient Location: PACU and Cath Lab  Anesthesia Type:MAC  Level of Consciousness: awake, patient cooperative, and responds to stimulation  Airway & Oxygen Therapy: Patient Spontanous Breathing  Post-op Assessment: Report given to RN and Post -op Vital signs reviewed and stable  Post vital signs: Reviewed and stable  Last Vitals:  Vitals Value Taken Time  BP 86/58 08/14/22 1055  Temp    Pulse 79 08/14/22 1055  Resp 22 08/14/22 1055  SpO2 94 % 08/14/22 1055  Vitals shown include unvalidated device data.  Last Pain:  Vitals:   08/14/22 0258  TempSrc: Oral  PainSc:          Complications: No notable events documented.

## 2022-08-15 ENCOUNTER — Other Ambulatory Visit (HOSPITAL_COMMUNITY): Payer: Self-pay

## 2022-08-15 ENCOUNTER — Encounter (HOSPITAL_COMMUNITY): Payer: Self-pay | Admitting: Internal Medicine

## 2022-08-15 DIAGNOSIS — I5022 Chronic systolic (congestive) heart failure: Secondary | ICD-10-CM | POA: Diagnosis not present

## 2022-08-15 DIAGNOSIS — I050 Rheumatic mitral stenosis: Secondary | ICD-10-CM | POA: Diagnosis not present

## 2022-08-15 DIAGNOSIS — I4819 Other persistent atrial fibrillation: Secondary | ICD-10-CM | POA: Diagnosis not present

## 2022-08-15 LAB — BASIC METABOLIC PANEL
Anion gap: 10 (ref 5–15)
BUN: 13 mg/dL (ref 8–23)
CO2: 22 mmol/L (ref 22–32)
Calcium: 8.7 mg/dL — ABNORMAL LOW (ref 8.9–10.3)
Chloride: 99 mmol/L (ref 98–111)
Creatinine, Ser: 0.93 mg/dL (ref 0.44–1.00)
GFR, Estimated: >60 mL/min (ref >60)
Glucose, Bld: 173 mg/dL — ABNORMAL HIGH (ref 70–99)
Potassium: 3.7 mmol/L (ref 3.5–5.1)
Sodium: 131 mmol/L — ABNORMAL LOW (ref 135–145)

## 2022-08-15 LAB — GLUCOSE, CAPILLARY
Glucose-Capillary: 119 mg/dL — ABNORMAL HIGH (ref 70–99)
Glucose-Capillary: 151 mg/dL — ABNORMAL HIGH (ref 70–99)

## 2022-08-15 LAB — PROTIME-INR
INR: 1.7 — ABNORMAL HIGH (ref 0.8–1.2)
Prothrombin Time: 20.6 seconds — ABNORMAL HIGH (ref 11.4–15.2)

## 2022-08-15 MED ORDER — ENOXAPARIN SODIUM 40 MG/0.4ML IJ SOSY
60.0000 mg | PREFILLED_SYRINGE | Freq: Two times a day (BID) | INTRAMUSCULAR | Status: DC
  Administered 2022-08-15: 60 mg via SUBCUTANEOUS
  Filled 2022-08-15: qty 0.8

## 2022-08-15 MED ORDER — METOPROLOL TARTRATE 25 MG PO TABS
12.5000 mg | ORAL_TABLET | Freq: Two times a day (BID) | ORAL | 3 refills | Status: DC
  Filled 2022-08-15: qty 30, 30d supply, fill #0

## 2022-08-15 MED ORDER — AMIODARONE HCL 200 MG PO TABS
ORAL_TABLET | ORAL | 0 refills | Status: DC
  Filled 2022-08-15: qty 117, 90d supply, fill #0

## 2022-08-15 MED ORDER — ENOXAPARIN SODIUM 60 MG/0.6ML IJ SOSY
60.0000 mg | PREFILLED_SYRINGE | Freq: Two times a day (BID) | INTRAMUSCULAR | 0 refills | Status: DC
  Filled 2022-08-15: qty 6, 5d supply, fill #0

## 2022-08-15 MED ORDER — WARFARIN SODIUM 5 MG PO TABS
5.0000 mg | ORAL_TABLET | Freq: Every day | ORAL | 1 refills | Status: DC
  Filled 2022-08-15: qty 45, 45d supply, fill #0

## 2022-08-15 MED ORDER — WARFARIN SODIUM 7.5 MG PO TABS
7.5000 mg | ORAL_TABLET | ORAL | Status: AC
  Administered 2022-08-15: 7.5 mg via ORAL
  Filled 2022-08-15: qty 1

## 2022-08-15 MED ORDER — POTASSIUM CHLORIDE CRYS ER 20 MEQ PO TBCR
40.0000 meq | EXTENDED_RELEASE_TABLET | Freq: Once | ORAL | Status: AC
  Administered 2022-08-15: 40 meq via ORAL
  Filled 2022-08-15: qty 2

## 2022-08-15 NOTE — Discharge Summary (Signed)
Discharge Summary    [name] MRN: [id number]; DOB: Jan 31, 1951  Admit date: 08/11/2022 Discharge date: 08/15/2022  PCP:  Gabriel Earing, FNP   [facility] Providers Cardiologist:  Nona Dell, MD  Cardiology APP:  Sharlene Dory, NP  Electrophysiologist:  Lewayne Bunting, MD       Discharge Diagnoses    Principal Problem:   A-fib [facility]) Active Problems:   Atrial flutter (HCC)   Cold sore    Diagnostic Studies/Procedures    08/14/22 TEE  IMPRESSIONS     1. Left ventricular ejection fraction, by estimation, is 25 to 30%. The  left ventricle has severely decreased function. The left ventricle  demonstrates global hypokinesis.   2. Right ventricular systolic function is severely reduced. The right  ventricular size is mildly enlarged.   3. Left atrial size was moderately dilated. No left atrial/left atrial  appendage thrombus was detected.   4. The mitral valve is rheumatic. Severe mitral valve regurgitation.  Severe mitral stenosis.   5. The aortic valve is tricuspid. Aortic valve regurgitation is trivial.   FINDINGS   Left Ventricle: Left ventricular ejection fraction, by estimation, is 25  to 30%. The left ventricle has severely decreased function. The left  ventricle demonstrates global hypokinesis. The left ventricular internal  cavity size was normal in size.   Right Ventricle: The right ventricular size is mildly enlarged. Right  ventricular systolic function is severely reduced.   Left Atrium: Left atrial size was moderately dilated. No left atrial/left  atrial appendage thrombus was detected.   Pericardium: There is no evidence of pericardial effusion.   Mitral Valve: Rheumatic mitral valve with restricted posterior mitral  valve. MV area by planimetry 1.19 cm2. The mitral valve is rheumatic.  Severe mitral valve regurgitation. Severe mitral valve stenosis. MV peak  gradient, 17.7 mmHg. The mean mitral  valve  gradient is 9.5 mmHg.   Tricuspid Valve: The tricuspid valve is normal in structure. Tricuspid  valve regurgitation is mild.   Aortic Valve: The aortic valve is tricuspid. Aortic valve regurgitation is  trivial.   Pulmonic Valve: The pulmonic valve was normal in structure. Pulmonic valve  regurgitation is trivial. No evidence of pulmonic stenosis.   Aorta: The aortic root and ascending aorta are structurally normal, with  no evidence of dilitation.   IAS/Shunts: No atrial level shunt detected by color flow Doppler.   Additional Comments: A device lead is visualized. Spectral Doppler  performed.   MITRAL VALVE                  TRICUSPID VALVE  MV Peak grad: 17.7 mmHg       TR Peak grad:   20.4 mmHg  MV Mean grad: 9.5 mmHg        TR Vmax:        226.00 cm/s  MV Vmax:      2.10 m/s  MV Vmean:     146.0 cm/s  MR Peak grad:    47.5 mmHg  MR Mean grad:    25.5 mmHg  MR Vmax:         344.50 cm/s  MR Vmean:        229.5 cm/s  MR PISA:         2.26 cm  MR PISA Eff ROA: 26 mm  MR PISA Radius:  0.60 cm  _____________   History of Present Illness     Carol Richmond is a 72 y.o. female  with rheumatic mitral valve disease, moderate to severe mitral valve stenosis, HFmrEF, AVNRT, status post radiofrequency ablation, OSA, CHB, s/p PPM, hypertension, carotid artery disease, type 2 diabetes, history of TIA. Patient presented to the ED on 6/24 after several days of dyspnea and fatigue. She was found to have elevated HR, ECG/telemetry tracings most consistent with atrial flutter.  Hospital Course     Consultants: n/a   Atrial fibrillation/flutter Hx AVNRT s/p ablation CHB, s/p PPM    Patient presented with dyspnea and fatigue, found with elevated HR on presentation to the ED, suspicious for atrial flutter. She was initiated on Amiodarone and transferred to [facility].    This morning, patient remains in normal sinus rhythm. TEE/DCCV with Dr. Wyline Mood on 08/14/22. Patient transitioned  to PO Amiodarone yesterday, will continue 400mg  BID x9 days and then continue with 200mg  daily. Continue Warfarin (mitral valve disease) with heparin bridge. INR 1.7, will discharge on Lovenox bridge with close coumadin clinic follow up next week on 7/2. Warfarin 5mg  daily.   Chronic HFmrEF   LVEF 40-45% per 03/01/22 TEE. TEE on 6/26 with LVEF 25-30%, suspect tachy-mediated reduction in LVEF. No evidence of acute CHF this admission. Continue Entresto 24-26mg  BID, Metoprolol Tartrate 12.5mg  BID. Currently no room to add Spironolactone 12.5mg  at this time given lower BP. Patient has previously been on Jardiance, stopped in January due to cost.  Per recent fill history, patient has been picking up Lasix at her pharmacy.  However she does not report taking this.  Has not required Lasix this admission, will continue to hold at discharge.  Please revisit use at outpatient follow-up.  Would plan to repeat TTE in 2-3 months to revisit LVEF.   Rheumatic mitral valve disease Moderate to severe mitral valve stenosis   03/01/22 TEE showing regurgitant fraction 33%, moderate MV regurgitation with moderate mitral valve stenosis. This study showed that valve not amenable to valvuloplasty due to severity of insufficiency and due to severe and asymmetrical focal calcification in mid anterior leaflet. Repeat TEE prior to DCCV with MV gradient 17.7, mean gradient 9.5 mmHg.   Hypertension   Fluctuant BP this admission. BP meds as above.   Carotid artery disease   Last carotid US showed both left and right carotid arteries with 1-39% stenosis. Although patient with prior TIA, with new need for [facility], would favor stopping ASA.    DM type II   Patient on metformin PTA. Resume Metformin at discharge. Last A1c 5.8.      Did the patient have an acute coronary syndrome (MI, NSTEMI, STEMI, etc) this admission?:  No                               Did the patient have a percutaneous coronary intervention (stent /  angioplasty)?:  No.    _____________  Discharge Vitals Blood pressure (!) 91/59, pulse 69, temperature 98.4 F (36.9 C), temperature source Oral, resp. rate 19, height 5\' 4"  (1.626 m), weight 66.1 kg, SpO2 100 %.  Filed Weights   08/13/22 0410 08/14/22 0258 08/15/22 0546  Weight: 69.2 kg 70.6 kg 66.1 kg    Labs & Radiologic Studies    CBC Recent Labs    08/13/22 0026 08/14/22 0041  WBC 9.9 11.4*  HGB 11.5* 11.3*  HCT 34.4* 33.1*  MCV 91.5 89.0  PLT 207 222   Basic Metabolic Panel Recent Labs    69/62/95 0041 08/15/22 0845  NA 129*  131*  K 3.9 3.7  CL 98 99  CO2 21* 22  GLUCOSE 164* 173*  BUN 17 13  CREATININE 1.19* 0.93  CALCIUM 8.4* 8.7*   Liver Function Tests No results for input(s): "AST", "ALT", "ALKPHOS", "BILITOT", "PROT", "ALBUMIN" in the last 72 hours. No results for input(s): "LIPASE", "AMYLASE" in the last 72 hours. High Sensitivity Troponin:   No results for input(s): "TROPONINIHS" in the last 720 hours.  BNP Invalid input(s): "POCBNP" D-Dimer No results for input(s): "DDIMER" in the last 72 hours. Hemoglobin A1C Recent Labs    08/14/22 0040  HGBA1C 6.3*   Fasting Lipid Panel No results for input(s): "CHOL", "HDL", "LDLCALC", "TRIG", "CHOLHDL", "LDLDIRECT" in the last 72 hours. Thyroid Function Tests No results for input(s): "TSH", "T4TOTAL", "T3FREE", "THYROIDAB" in the last 72 hours.  Invalid input(s): "FREET3" _____________  ECHO TEE  Result Date: 08/14/2022    TRANSESOPHOGEAL ECHO REPORT   Patient Name:   Carol Richmond Date of Exam: 08/14/2022 Medical Rec #:  [id number]           Height:       64.0 in Accession #:    [id number]          Weight:       155.6 lb Date of Birth:  [birth year]           BSA:          1.759 m Patient Age:    72 years            BP:           98/76 mmHg Patient Gender: F                   HR:           128 bpm. Exam Location:  Inpatient Procedure: 3D Echo, Transesophageal Echo, Cardiac Doppler and Color Doppler  Indications:     R94.31 Abnormal EKG  History:         Patient has prior history of Echocardiogram examinations, most                  recent 03/01/2022. Cardiomyopathy, Abnormal ECG and Pacemaker,                  Arrythmias:Atrial Fibrillation, SVT and LBBB; Risk                  Factors:Hypertension, Diabetes and Dyslipidemia.  Sonographer:     Sheralyn Boatman RDCS Referring Phys:  [id number] Jonita Albee Diagnosing Phys: [place] PROCEDURE: After discussion of the risks and benefits of a TEE, an informed consent was obtained from the patient. The transesophogeal probe was passed without difficulty through the esophogus of the patient. Imaged were obtained with the patient in a left lateral decubitus position. Sedation performed by different physician. The patient was monitored while under deep sedation. Anesthestetic sedation was provided intravenously by Anesthesiology: 159mg  of Propofol. The patient's vital signs; including heart rate, blood pressure, and oxygen saturation; remained stable throughout the procedure. The patient developed no complications during the procedure. A successful direct current cardioversion was performed at 120 joules with 1 attempt.  IMPRESSIONS  1. Left ventricular ejection fraction, by estimation, is 25 to 30%. The left ventricle has severely decreased function. The left ventricle demonstrates global hypokinesis.  2. Right ventricular systolic function is severely reduced. The right ventricular size is mildly enlarged.  3. Left atrial size was moderately dilated. No left atrial/left atrial appendage  thrombus was detected.  4. The mitral valve is rheumatic. Severe mitral valve regurgitation. Severe mitral stenosis.  5. The aortic valve is tricuspid. Aortic valve regurgitation is trivial. FINDINGS  Left Ventricle: Left ventricular ejection fraction, by estimation, is 25 to 30%. The left ventricle has severely decreased function. The left ventricle demonstrates global hypokinesis. The  left ventricular internal cavity size was normal in size. Right Ventricle: The right ventricular size is mildly enlarged. Right ventricular systolic function is severely reduced. Left Atrium: Left atrial size was moderately dilated. No left atrial/left atrial appendage thrombus was detected. Pericardium: There is no evidence of pericardial effusion. Mitral Valve: Rheumatic mitral valve with restricted posterior mitral valve. MV area by planimetry 1.19 cm2. The mitral valve is rheumatic. Severe mitral valve regurgitation. Severe mitral valve stenosis. MV peak gradient, 17.7 mmHg. The mean mitral valve gradient is 9.5 mmHg. Tricuspid Valve: The tricuspid valve is normal in structure. Tricuspid valve regurgitation is mild. Aortic Valve: The aortic valve is tricuspid. Aortic valve regurgitation is trivial. Pulmonic Valve: The pulmonic valve was normal in structure. Pulmonic valve regurgitation is trivial. No evidence of pulmonic stenosis. Aorta: The aortic root and ascending aorta are structurally normal, with no evidence of dilitation. IAS/Shunts: No atrial level shunt detected by color flow Doppler. Additional Comments: A device lead is visualized. Spectral Doppler performed. MITRAL VALVE                  TRICUSPID VALVE MV Peak grad: 17.7 mmHg       TR Peak grad:   20.4 mmHg MV Mean grad: 9.5 mmHg        TR Vmax:        226.00 cm/s MV Vmax:      2.10 m/s MV Vmean:     146.0 cm/s MR Peak grad:    47.5 mmHg MR Mean grad:    25.5 mmHg MR Vmax:         344.50 cm/s MR Vmean:        229.5 cm/s MR PISA:         2.26 cm MR PISA Eff ROA: 26 mm MR PISA Radius:  0.60 cm Carolan Clines Electronically signed by Carolan Clines Signature Date/Time: 08/14/2022/2:16:22 PM    Final    EP STUDY  Result Date: 08/14/2022 See surgical note for result.  DG Chest Portable 1 View  Result Date: 08/11/2022 CLINICAL DATA:  Shortness of breath for several days EXAM: PORTABLE CHEST 1 VIEW COMPARISON:  12/15/2021 FINDINGS: Cardiac shadow is  mildly prominent but stable from the prior exam. Pacing device is again seen. Lungs are well aerated bilaterally. No focal infiltrate is noted. Mild central vascular congestion is seen without edema. No bony abnormality is noted. IMPRESSION: Mild vascular congestion without significant edema. Electronically Signed   By: Alcide Clever M.D.   On: 08/11/2022 11:56   US Soft Tissue Head/Neck (NON-THYROID)  Result Date: 07/26/2022 CLINICAL DATA:  Right cervical lymphadenopathy. EXAM: ULTRASOUND OF HEAD/NECK SOFT TISSUES TECHNIQUE: Ultrasound examination of the head and neck soft tissues was performed in the area of clinical concern. COMPARISON:  None Available. FINDINGS: Sonographic evaluation of patient's palpable area of concern within the right-side of the neck correlates with several adjacent mildly prominent though non pathologically enlarged and benign-appearing cervical lymph nodes with index right cervical lymph node measuring 0.5 cm in greatest short axis diameter and maintaining a benign fatty hilum (image 24). Otherwise, there is no sonographic correlate for patient's palpable area of concern. Specifically, no  discrete solid or cystic lesions. IMPRESSION: Sonographic evaluation of patient's palpable area of concern within the right-side of the neck correlates with several adjacent mildly prominent though non pathologically enlarged and benign-appearing cervical lymph nodes, nonspecific though presumably reactive in etiology. Electronically Signed   By: Simonne Come M.D.   On: 07/26/2022 13:10   Disposition   Pt is being discharged home today in good condition.  Follow-up Plans & Appointments        Discharge Medications   Allergies as of 08/15/2022   No Known Allergies      Medication List     STOP taking these medications    amoxicillin-clavulanate 875-125 MG tablet Commonly known as: AUGMENTIN   aspirin EC 81 MG tablet   carvedilol 3.125 MG tablet Commonly known as: COREG    clotrimazole 10 MG troche Commonly known as: MYCELEX   furosemide 40 MG tablet Commonly known as: LASIX   levocetirizine 5 MG tablet Commonly known as: XYZAL   magic mouthwash (lidocaine, diphenhydrAMINE, alum & mag hydroxide) suspension   nystatin 100000 UNIT/ML suspension Commonly known as: MYCOSTATIN   potassium chloride SA 20 MEQ tablet Commonly known as: KLOR-CON M   promethazine-dextromethorphan 6.25-15 MG/5ML syrup Commonly known as: PROMETHAZINE-DM       TAKE these medications    Accu-Chek Guide test strip Generic drug: glucose blood Use as instructed to test blood sugar daily as directed. DX:E11.9   Accu-Chek Softclix Lancets lancets check blood sugars twice daily DX E11.65   acetaminophen 500 MG tablet Commonly known as: TYLENOL Take 500 mg by mouth daily as needed for mild pain.   amiodarone 200 MG tablet Commonly known as: PACERONE Take 2 tablets (400 mg total) by mouth 2 (two) times daily for 9 days, THEN 1 tablet (200 mg total) daily. Start taking on: August 15, 2022   cetirizine 10 MG tablet Commonly known as: ZyrTEC Allergy Take 1 tablet (10 mg total) by mouth daily.   enoxaparin 60 MG/0.6ML injection Commonly known as: LOVENOX Inject 0.6 mLs (60 mg total) into the skin 2 (two) times daily for 5 days.   Entresto 24-26 MG Generic drug: sacubitril-valsartan Take 1 tablet by mouth 2 (two) times daily.   fluticasone 50 MCG/ACT nasal spray Commonly known as: FLONASE Place 2 sprays into both nostrils daily. What changed:  when to take this reasons to take this   gabapentin 300 MG capsule Commonly known as: NEURONTIN Take 300 mg by mouth 3 (three) times daily as needed (neuropathy).   lactulose 10 GM/15ML solution Commonly known as: CHRONULAC TAKE 30 MLS (20 GRAM TOTAL) BY MOUTH 3 TIMES A DAY. What changed: See the new instructions.   lamoTRIgine 200 MG tablet Commonly known as: LAMICTAL TAKE (1) TABLET TWICE DAILY. What changed: See  the new instructions.   LORazepam 0.5 MG tablet Commonly known as: ATIVAN Take 1 tablet (0.5 mg total) by mouth 2 (two) times daily as needed for anxiety.   metFORMIN 500 MG tablet Commonly known as: GLUCOPHAGE Take 1 tablet (500 mg total) by mouth 2 (two) times daily with a meal.   metoprolol tartrate 25 MG tablet Commonly known as: LOPRESSOR Take 0.5 tablets (12.5 mg total) by mouth 2 (two) times daily.   Multivitamin Women 50+ Tabs Take 1 tablet by mouth daily.   Omron 3 Series BP Monitor Devi Use as directed   ondansetron 4 MG tablet Commonly known as: ZOFRAN TAKE 1 TABLET BY MOUTH EVERY 8 HOURS AS NEEDED FOR NAUSEA AND VOMITING.  What changed: See the new instructions.   pantoprazole 40 MG tablet Commonly known as: PROTONIX TAKE (1) TABLET TWICE DAILY. What changed: See the new instructions.   traZODone 50 MG tablet Commonly known as: DESYREL Take 1 tablet (50 mg total) by mouth at bedtime as needed for sleep.   warfarin 5 MG tablet Commonly known as: Coumadin Take 1 tablet (5 mg total) by mouth daily. Or as directed by Coumadin Clinic. Start taking on: August 16, 2022           Outstanding Labs/Studies    Duration of Discharge Encounter   Greater than 30 minutes including physician time.  SignedPerlie Gold, PA-C 08/15/2022, 1:12 PM

## 2022-08-15 NOTE — Progress Notes (Signed)
Discharge instructions reviewed with pt and her daughter. Pharmacist Selena Batten came and spoke with/educated pt and daughter on her meds as pt had asked her to do. This RN went over all meds at great length reviewing new meds and current meds pt is on. Pt and daughter verbalized understanding, next doses noted on her discharge med list. Also discussed coumadin and lovenox education, and handouts provided. Pt and daughter understand and know how to administer the lovenox injection.  Copy of instructions given to pt. [facility] filled scripts and meds were delivered to pt's room by this nurse.  Pt d/c'd via wheelchair with belongings,meds,  with daughter.            Escorted by staff.   Annice Needy, RN SWOT

## 2022-08-15 NOTE — TOC Transition Note (Signed)
Transition of Care [facility]) - CM/SW Discharge Note   Patient Details  Name: Carol Richmond MRN: [id number] Date of Birth: November 15, 1950  Transition of [facility] CM/SW Contact:  Leone Haven, RN Phone Number: 08/15/2022, 1:22 PM   Clinical Narrative:    Patient is for dc today, she has no needs.      Barriers to Discharge: Continued Medical Work up   Patient Goals and CMS Choice   Choice offered to / list presented to : NA  Discharge Placement                         Discharge Plan and Services Additional resources added to the After Visit Summary for   In-house Referral: NA Discharge Planning Services: CM Consult Post Acute Care Choice: NA          DME Arranged: N/A DME Agency: NA       HH Arranged: NA          Social Determinants of Health (SDOH) Interventions SDOH Screenings   Food Insecurity: No Food Insecurity (01/04/2022)  Housing: Low Risk  (01/04/2022)  Transportation Needs: No Transportation Needs (01/04/2022)  Utilities: Not At Risk (01/04/2022)  Alcohol Screen: Low Risk  (07/05/2021)  Depression (PHQ2-9): Low Risk  (08/07/2022)  Financial Resource Strain: Low Risk  (01/04/2022)  Physical Activity: Sufficiently Active (01/04/2022)  Social Connections: Moderately Integrated (01/04/2022)  Stress: No Stress Concern Present (01/04/2022)  Tobacco Use: Low Risk  (08/15/2022)     Readmission Risk Interventions     No data to display

## 2022-08-15 NOTE — Progress Notes (Signed)
Patient Name: Carol Richmond Date of Encounter: 08/15/2022 [facility] Cardiologist: Nona Dell, MD   Interval Summary  .    No issues overnight- no recurrent afib. BP improved. INR was 1.2 on 6/25.  Vital Signs .    Vitals:   08/14/22 1926 08/14/22 2324 08/15/22 0546 08/15/22 0716  BP: 108/64 112/69 116/69 100/68  Pulse: 89 86 94 77  Resp: 19 18 20 19   Temp: 98.3 F (36.8 C) 97.8 F (36.6 C) 98 F (36.7 C) 97.9 F (36.6 C)  TempSrc: Oral Oral Oral Oral  SpO2: 99% 97% 95% 100%  Weight:   66.1 kg   Height:        Intake/Output Summary (Last 24 hours) at 08/15/2022 5621 Last data filed at 08/15/2022 3086 Gross per 24 hour  Intake 1008.54 ml  Output 200 ml  Net 808.54 ml      08/15/2022    5:46 AM 08/14/2022    2:58 AM 08/13/2022    4:10 AM  Last 3 Weights  Weight (lbs) 145 lb 12.8 oz 155 lb 10.3 oz 152 lb 8.9 oz  Weight (kg) 66.134 kg 70.6 kg 69.2 kg      Telemetry/ECG    Paced rhythm - personally reviewed  Physical Exam .    General appearance: alert and no distress Neck: no JVD, supple, symmetrical, trachea midline, and thyroid not enlarged, symmetric, no tenderness/mass/nodules Lungs: clear to auscultation bilaterally Heart: regular rate and rhythm Abdomen: soft, non-tender; bowel sounds normal; no masses,  no organomegaly Extremities: extremities normal, atraumatic, no cyanosis or edema Pulses: 2+ and symmetric Skin: Skin color, texture, turgor normal. No rashes or lesions Neurologic: Grossly normal Psych: Plesant   Assessment & Plan .     Atrial fibrillation/flutter Hx AVNRT s/p ablation CHB, s/p PPM   Patient presented with dyspnea and fatigue, found with elevated HR on presentation to the ED, suspicious for atrial flutter. She was initiated on Amiodarone and transferred to [facility].   Maintaining paced rhythm On oral Amiodarone 400mg  BID x10 days then 200mg  daily. Continue Warfarin (mitral valve disease) with heparin  bridge. INR 1.2 (6/27), will need Lovenox bridge in order to expedite discharge following cardioversion.  Switch to lovenox 60 mg SQ q12 hours starting today - will probably need 5 day supply going home  Chronic HFmrEF  LVEF 40-45% per 03/01/22 TEE. No evidence of acute CHF this admission. Continue Entresto 24-26mg  BID, Metoprolol Tartrate 12.5mg  BID. Currently no room to add Spironolactone 12.5mg  at this time given lower BP. Patient has previously been on Jardiance, stopped in January due to cost.   Rheumatic mitral valve disease Moderate to severe mitral valve stenosis  03/01/22 TEE showing regurgitant fraction 33%, moderate MV regurgitation with moderate mitral valve stenosis. This study showed that valve not amenable to valvuloplasty due to severity of insufficiency and due to severe and asymmetrical focal calcification in mid anterior leaflet.  Hypertension  Fluctuant BP this admission. BP meds as above in HR.  Carotid artery disease  Last carotid US showed both left and right carotid arteries with 1-39% stenosis. Although patient with prior TIA, with new need for [facility], would favor stopping ASA.   DM type II  Patient on metformin PTA. Will use conservative SSI while admitted to prevent hyperglycemia. Last A1c 5.8.  Anticipate D/C later today with lovenox bridge. Will arrange follow-up with the anticoagulation clinic in [place] and with her primary cardiologist Dr. Diona Browner.  Chrystie Nose, MD, [facility], FACP  [place]  [facility]  Medical Director of the [facility] Lipid [facility] &  Cardiovascular Risk Reduction Clinic Diplomate of the American Board of Clinical Lipidology Attending Cardiologist  Direct Dial: [phone]  Fax: [phone]  Website:  [email or web address]

## 2022-08-15 NOTE — Progress Notes (Signed)
ANTICOAGULATION CONSULT NOTE - Follow Up Consult  Pharmacy Consult for IV heparin >> warfarin Indication: atrial fibrillation with rheumatic mitral stenosis  No Known Allergies  Patient Measurements: Height: 5\' 4"  (162.6 cm) Weight: 66.1 kg (145 lb 12.8 oz) IBW/kg (Calculated) : 54.7 Heparin Dosing Weight: 68.8 kg  Vital Signs: Temp: 97.9 F (36.6 C) (06/27 0716) Temp Source: Oral (06/27 0716) BP: 100/68 (06/27 0716) Pulse Rate: 77 (06/27 0716)  Labs: Recent Labs    08/12/22 2315 08/13/22 0026 08/14/22 0041 08/15/22 0845  HGB  --  11.5* 11.3*  --   HCT  --  34.4* 33.1*  --   PLT  --  207 222  --   LABPROT  --  14.2 15.5* 20.6*  INR  --  1.1 1.2 1.7*  HEPARINUNFRC 0.48 0.48 0.50  --   CREATININE  --   --  1.19* 0.93    Assessment: 72 yo female presented w/ shortness of breath. PMH includes HF, mitral stenosis, and pacemaker placement.; has new diagnosis of Afib and was to be seen in Afib Clinic on 6/24. No anticoagulation PTA. CHADS-VASc = 5. Pharmacy consulted to dose IV heparin >> warfarin.   Spoke with medical team.  Anticipate discharge today with Lovenox > Warfarin until INR therapeutic.  D/C heparin and start Lovenox 60mg  SQ q12h.  IRN up to 1.7.  Will repeat warfarin 7.5mg  dose now with patient instructions to start warfarin 5mg  daily on 6/28 until outpatient INR follow-up on 7/2.  Goal of Therapy:  INR 2.0-3.0 Monitor platelets by anticoagulation protocol: Yes   Plan:  Lovenox 60mg  SQ q12h Warfarin 7.5mg  PO x 1 now Plan for Lovenox 60mg  SQ q12h and warfarin 5mg  daily on discharge until INR check 7/2.  Thank you for allowing pharmacy to participate in this patient's care,  [employer], Pharm.D., BCPS Clinical Pharmacist Clinical phone for 08/15/2022 from 7:30-3:00 is [phone].  **Pharmacist phone directory can be found on [email or web address] listed under [facility].  08/15/2022 10:42 AM

## 2022-08-16 ENCOUNTER — Ambulatory Visit: Payer: Self-pay | Admitting: *Deleted

## 2022-08-16 ENCOUNTER — Other Ambulatory Visit: Payer: Self-pay | Admitting: Cardiology

## 2022-08-16 ENCOUNTER — Other Ambulatory Visit (HOSPITAL_COMMUNITY): Payer: Self-pay

## 2022-08-16 DIAGNOSIS — I152 Hypertension secondary to endocrine disorders: Secondary | ICD-10-CM

## 2022-08-16 DIAGNOSIS — E1165 Type 2 diabetes mellitus with hyperglycemia: Secondary | ICD-10-CM

## 2022-08-16 DIAGNOSIS — I10 Essential (primary) hypertension: Secondary | ICD-10-CM

## 2022-08-16 NOTE — Transitions of Care (Post Inpatient/ED Visit) (Signed)
08/16/2022  Name: Carol Richmond MRN: [id number] DOB: 1950-06-30  Today's TOC FU Call Status: Today's TOC FU Call Status:: Successful TOC FU Call Competed TOC FU Call Complete Date: 08/16/22  Transition Care Management Follow-up Telephone Call Date of Discharge: 08/15/22 Discharge Facility: [facility]) Type of Discharge: Inpatient Admission Primary Inpatient Discharge Diagnosis:: New onset Atrial Fibrillation How have you been since you were released from the hospital?: Better Any questions or concerns?: No  Items Reviewed: Did you receive and understand the discharge instructions provided?: Yes Medications obtained,verified, and reconciled?: Yes (Medications Reviewed) Any new allergies since your discharge?: No Dietary orders reviewed?: Yes Type of Diet Ordered:: heart healthy, carbohydrate modified Do you have support at home?: Yes People in Home: alone Name of Support/Comfort Primary Source: daughter [name]  Medications Reviewed Today: Medications Reviewed Today     Reviewed by Audrie Gallus, RN (Registered Nurse) on 08/16/22 at [phone]  Med List Status: <None>   Medication Order Taking? Sig Documenting Provider Last Dose Status Informant  Accu-Chek Softclix Lancets lancets [id number] Yes check blood sugars twice daily DX E11.65 Gabriel Earing, FNP Taking Active Self, Child, Pharmacy Records  acetaminophen (TYLENOL) 500 MG tablet [id number] Yes Take 500 mg by mouth daily as needed for mild pain. [provider] Taking Active Self, Child  amiodarone (PACERONE) 200 MG tablet [id number] Yes Take 2 tablets (400 mg total) by mouth 2 (two) times daily for 9 days, THEN 1 tablet (200 mg total) daily. Perlie Gold, PA-C Taking Active   Blood Pressure Monitoring (OMRON 3 SERIES BP MONITOR) DEVI [id number] Yes Use as directed Sharlene Dory, NP Taking Active Self, Child, Pharmacy Records  cetirizine (ZYRTEC ALLERGY) 10 MG tablet [id number] Yes Take 1 tablet (10 mg total)  by mouth daily. Junie Spencer, FNP Taking Active Self, Child, Pharmacy Records  enoxaparin (LOVENOX) 60 MG/0.6ML injection [id number] Yes Inject 0.6 mLs (60 mg total) into the skin 2 (two) times daily for 5 days. Perlie Gold, PA-C Taking Active   fluticasone (FLONASE) 50 MCG/ACT nasal spray [id number] Yes Place 2 sprays into both nostrils daily.  Patient taking differently: Place 2 sprays into both nostrils daily as needed for allergies.   Junie Spencer, FNP Taking Active Self, Child, Pharmacy Records  gabapentin (NEURONTIN) 300 MG capsule [id number] Yes Take 300 mg by mouth 3 (three) times daily as needed (neuropathy). [provider] Taking Active Self, Child, Pharmacy Records  glucose blood (ACCU-CHEK GUIDE) test strip [id number] Yes Use as instructed to test blood sugar daily as directed. DX:E11.9 Gabriel Earing, FNP Taking Active Self, Child, Pharmacy Records  lactulose [facility]) 10 GM/15ML solution [id number] Yes TAKE 30 MLS (20 GRAM TOTAL) BY MOUTH 3 TIMES A DAY.  Patient taking differently: Take 20 g by mouth 3 (three) times daily.   Dolores Frame, MD Taking Active Self, Child, Pharmacy Records  lamoTRIgine (LAMICTAL) 200 MG tablet [id number] Yes TAKE (1) TABLET TWICE DAILY.  Patient taking differently: Take 200 mg by mouth 2 (two) times daily.   Gabriel Earing, FNP Taking Active Self, Child, Pharmacy Records  LORazepam (ATIVAN) 0.5 MG tablet [id number] Yes Take 1 tablet (0.5 mg total) by mouth 2 (two) times daily as needed for anxiety. Gabriel Earing, FNP Taking Active Self, Child, Pharmacy Records  metFORMIN (GLUCOPHAGE) 500 MG tablet [id number] Yes Take 1 tablet (500 mg total) by mouth 2 (two) times daily with a meal. Gabriel Earing, FNP Taking Active Self, Child, Pharmacy Records  metoprolol tartrate (  LOPRESSOR) 25 MG tablet [id number] Yes Take 0.5 tablets (12.5 mg total) by mouth 2 (two) times daily. Perlie Gold, PA-C Taking Active   Multiple  Vitamins-Minerals (MULTIVITAMIN WOMEN 50+) TABS [id number] Yes Take 1 tablet by mouth daily. [provider] Taking Active Self, Child  ondansetron (ZOFRAN) 4 MG tablet [id number] Yes TAKE 1 TABLET BY MOUTH EVERY 8 HOURS AS NEEDED FOR NAUSEA AND VOMITING.  Patient taking differently: Take 4 mg by mouth every 8 (eight) hours as needed for vomiting or nausea.   Dolores Frame, MD Taking Active Self, Child, Pharmacy Records  pantoprazole (PROTONIX) 40 MG tablet [id number] Yes TAKE (1) TABLET TWICE DAILY.  Patient taking differently: Take 40 mg by mouth 2 (two) times daily.   Raquel James, NP Taking Active Self, Child, Pharmacy Records  sacubitril-valsartan [facility]) 24-26 [place] [id number] Yes Take 1 tablet by mouth 2 (two) times daily. Jonelle Sidle, MD Taking Active Self, Child, Pharmacy Records  traZODone (DESYREL) 50 MG tablet [id number] Yes Take 1 tablet (50 mg total) by mouth at bedtime as needed for sleep. Gabriel Earing, FNP Taking Active Self, Child, Pharmacy Records  warfarin (COUMADIN) 5 MG tablet [id number] Yes Take 1 tablet (5 mg total) by mouth daily. Or as directed by Coumadin Clinic. Perlie Gold, PA-C Taking Active             Home Care and Equipment/Supplies: Were Home Health Services Ordered?: No Any new equipment or medical supplies ordered?: No  Functional Questionnaire: Do you need assistance with bathing/showering or dressing?: No Do you need assistance with meal preparation?: No Do you need assistance with eating?: No Do you have difficulty maintaining continence: No Do you need assistance with getting out of bed/getting out of a chair/moving?: No Do you have difficulty managing or taking your medications?: No  Follow up appointments reviewed: PCP Follow-up appointment confirmed?: Yes Date of PCP follow-up appointment?: 08/19/22 Follow-up Provider: Harlow Mares at 1030 am Specialist Hospital Follow-up appointment confirmed?: Yes Date  of Specialist follow-up appointment?: 08/26/22 Follow-Up Specialty Provider:: Cardiology Sharlene Dory at 10 am Do you need transportation to your follow-up appointment?: No Do you understand care options if your condition(s) worsen?: Yes-patient verbalized understanding    Irving Shows [facility], BSN RN Case Manager [facility] [phone]

## 2022-08-18 DIAGNOSIS — E1159 Type 2 diabetes mellitus with other circulatory complications: Secondary | ICD-10-CM

## 2022-08-18 DIAGNOSIS — Z7984 Long term (current) use of oral hypoglycemic drugs: Secondary | ICD-10-CM | POA: Diagnosis not present

## 2022-08-18 DIAGNOSIS — I10 Essential (primary) hypertension: Secondary | ICD-10-CM

## 2022-08-19 ENCOUNTER — Encounter: Payer: Self-pay | Admitting: Family Medicine

## 2022-08-19 ENCOUNTER — Ambulatory Visit: Payer: Medicare HMO | Admitting: Family Medicine

## 2022-08-19 VITALS — BP 106/64 | HR 90 | Temp 98.3°F | Ht 64.0 in | Wt 153.0 lb

## 2022-08-19 DIAGNOSIS — Z09 Encounter for follow-up examination after completed treatment for conditions other than malignant neoplasm: Secondary | ICD-10-CM

## 2022-08-19 DIAGNOSIS — I10 Essential (primary) hypertension: Secondary | ICD-10-CM

## 2022-08-19 DIAGNOSIS — E1159 Type 2 diabetes mellitus with other circulatory complications: Secondary | ICD-10-CM

## 2022-08-19 DIAGNOSIS — I4891 Unspecified atrial fibrillation: Secondary | ICD-10-CM | POA: Diagnosis not present

## 2022-08-19 DIAGNOSIS — I4892 Unspecified atrial flutter: Secondary | ICD-10-CM | POA: Diagnosis not present

## 2022-08-19 NOTE — Progress Notes (Signed)
   Established Patient Office Visit  Subjective   [name], female    DOB: 1950/04/19  Age: 72 y.o. MRN: [id number]  Chief Complaint  Patient presents with   Hospitalization Follow-up    HPI She went to [facility] on 08/11/22 for shortness of breath. She was found to be in a. Flutter. She was started amdiodarone and transferred her to [facility] where she was admitted and discharged on 08/15/22. She was cardioverte Appt with cardiology tomorrow. She was discharged hom with amiodarone and started on warfarin with heparin bridge. She has a follow up with the A.fib clinic tomorrow and with cardiologist next week.    She felt weak when she got home but this improved with activity. She denies palpitations, shortness of breath, dizziness, lightheadedness, edema or syncope.      ROS As per HPI.    Objective:     BP 106/64   Pulse 90   Temp 98.3 F (36.8 C)   Ht 5\' 4"  (1.626 m)   Wt 153 lb (69.4 kg)   SpO2 98%   BMI 26.26 kg/m    Physical Exam Vitals and nursing note reviewed.  Constitutional:      General: She is not in acute distress.    Appearance: She is not ill-appearing, toxic-appearing or diaphoretic.  HENT:     Right Ear: Tympanic membrane, ear canal and external ear normal.     Left Ear: Tympanic membrane, ear canal and external ear normal.     Nose: Nose normal.     Mouth/Throat:     Mouth: Mucous membranes are moist.     Pharynx: Oropharynx is clear. No oropharyngeal exudate or posterior oropharyngeal erythema.  Cardiovascular:     Rate and Rhythm: Normal rate and regular rhythm.     Heart sounds: Murmur heard.     Systolic murmur is present with a grade of 2/6.  Pulmonary:     Effort: Pulmonary effort is normal.     Breath sounds: Normal breath sounds.  Musculoskeletal:     Right lower leg: No edema.     Left lower leg: No edema.  Skin:    General: Skin is warm and dry.  Neurological:     Mental Status: She is alert and oriented to person, place,  and time. Mental status is at baseline.  Psychiatric:        Mood and Affect: Mood normal.        Behavior: Behavior normal.      No results found for any visits on 08/19/22.    The 10-year ASCVD risk score (Arnett DK, et al., 2019) is: 17.1%    Assessment & Plan:   Carol Richmond was seen today for hospitalization follow-up.  Diagnoses and all orders for this visit:  New onset atrial fibrillation (HCC) Atrial flutter, unspecified type (HCC) Now on amiodarone and coumadin with heparin bridge. Regular rate and rhythm today. Denies symptoms. Follow up with a. Fib clinic and cardiology as scheduled.   Hypertension Well controlled on current regimen.   Hospital discharge follow-up Reviewed hospital records   Return if symptoms worsen or fail to improve.   The patient indicates understanding of these issues and agrees with the plan.  Gabriel Earing, FNP

## 2022-08-20 ENCOUNTER — Encounter (HOSPITAL_COMMUNITY): Payer: Self-pay | Admitting: Internal Medicine

## 2022-08-20 ENCOUNTER — Ambulatory Visit: Payer: Medicare HMO | Attending: Cardiology | Admitting: *Deleted

## 2022-08-20 DIAGNOSIS — Z5181 Encounter for therapeutic drug level monitoring: Secondary | ICD-10-CM

## 2022-08-20 DIAGNOSIS — I4891 Unspecified atrial fibrillation: Secondary | ICD-10-CM | POA: Diagnosis not present

## 2022-08-20 LAB — POCT INR: INR: 2.9 (ref 2.0–3.0)

## 2022-08-20 NOTE — Patient Instructions (Signed)
Continue warfarin 5mg  daily. Start taking in the evening Stop Lovenox injections Recheck on 08/26/22

## 2022-08-20 NOTE — Anesthesia Postprocedure Evaluation (Signed)
Anesthesia Post Note  Patient: Carol Richmond  Procedure(s) Performed: TRANSESOPHAGEAL ECHOCARDIOGRAM CARDIOVERSION     Patient location during evaluation: Cath Lab Anesthesia Type: General Level of consciousness: awake and alert Pain management: pain level controlled Vital Signs Assessment: post-procedure vital signs reviewed and stable Respiratory status: spontaneous breathing, nonlabored ventilation, respiratory function stable and patient connected to nasal cannula oxygen Cardiovascular status: stable Postop Assessment: no apparent nausea or vomiting Anesthetic complications: no   No notable events documented.  Last Vitals:  Vitals:   08/15/22 0716 08/15/22 1101  BP: 100/68 (!) 91/59  Pulse: 77 69  Resp: 19 19  Temp: 36.6 C 36.9 C  SpO2: 100% 100%    Last Pain:  Vitals:   08/15/22 1101  TempSrc: Oral  PainSc:                  Cristian Grieves

## 2022-08-23 ENCOUNTER — Telehealth: Payer: Self-pay | Admitting: Family Medicine

## 2022-08-23 NOTE — Telephone Encounter (Signed)
Patient will try to get to the store.

## 2022-08-26 ENCOUNTER — Ambulatory Visit (INDEPENDENT_AMBULATORY_CARE_PROVIDER_SITE_OTHER): Payer: Medicare HMO | Admitting: *Deleted

## 2022-08-26 ENCOUNTER — Ambulatory Visit: Payer: Medicare HMO | Attending: Nurse Practitioner | Admitting: Nurse Practitioner

## 2022-08-26 ENCOUNTER — Encounter: Payer: Self-pay | Admitting: Nurse Practitioner

## 2022-08-26 VITALS — BP 115/70 | HR 66 | Ht 64.0 in | Wt 150.8 lb

## 2022-08-26 DIAGNOSIS — R251 Tremor, unspecified: Secondary | ICD-10-CM

## 2022-08-26 DIAGNOSIS — I5020 Unspecified systolic (congestive) heart failure: Secondary | ICD-10-CM

## 2022-08-26 DIAGNOSIS — I050 Rheumatic mitral stenosis: Secondary | ICD-10-CM

## 2022-08-26 DIAGNOSIS — Z79899 Other long term (current) drug therapy: Secondary | ICD-10-CM | POA: Diagnosis not present

## 2022-08-26 DIAGNOSIS — Z5181 Encounter for therapeutic drug level monitoring: Secondary | ICD-10-CM | POA: Diagnosis not present

## 2022-08-26 DIAGNOSIS — I4891 Unspecified atrial fibrillation: Secondary | ICD-10-CM

## 2022-08-26 DIAGNOSIS — I442 Atrioventricular block, complete: Secondary | ICD-10-CM

## 2022-08-26 DIAGNOSIS — Z758 Other problems related to medical facilities and other health care: Secondary | ICD-10-CM

## 2022-08-26 DIAGNOSIS — I059 Rheumatic mitral valve disease, unspecified: Secondary | ICD-10-CM | POA: Diagnosis not present

## 2022-08-26 DIAGNOSIS — Z950 Presence of cardiac pacemaker: Secondary | ICD-10-CM

## 2022-08-26 DIAGNOSIS — I4892 Unspecified atrial flutter: Secondary | ICD-10-CM

## 2022-08-26 DIAGNOSIS — I340 Nonrheumatic mitral (valve) insufficiency: Secondary | ICD-10-CM

## 2022-08-26 LAB — POCT INR: INR: 4.1 — AB (ref 2.0–3.0)

## 2022-08-26 NOTE — Patient Instructions (Addendum)
Medication Instructions:  Your physician recommends that you continue on your current medications as directed. Please refer to the Current Medication list given to you today.  Labwork: CBC,CMET,THYROID, MAG at [facility] in 2 weeks   Testing/Procedures: none  Follow-Up: Your physician recommends that you schedule a follow-up appointment in: 6-8 weeks with peck  Any Other Special Instructions Will Be Listed Below (If Applicable).  Referral sent to [facility]   If you need a refill on your cardiac medications before your next appointment, please call your pharmacy.

## 2022-08-26 NOTE — Patient Instructions (Signed)
Hold warfarin tonight then decrease dose to 1 tablet daily except 1/2 tablet on Mondays, Wednesdays and Fridays. Recheck in 2 weeks

## 2022-08-26 NOTE — Progress Notes (Addendum)
Cardiology Office Note:  .   Date:  08/26/2022  ID:  Carol Richmond, DOB [birth year], MRN [id number] PCP: Gabriel Earing, FNP  [facility] Providers Cardiologist:  Nona Dell, MD Cardiology APP:  Sharlene Dory, NP  Electrophysiologist:  Lewayne Bunting, MD    History of Present Illness: Carol Richmond is a 72 y.o. female with a PMH of rheumatic MV disease, moderate to severe mitral valve stenosis, HFmrEF, AVNRT, s/p RF ablation, OSA, CHB, s/p PPM, HTN, carotid artery disease, T2DM, hx of TIA, who presents today for hospital follow-up.   I last saw patient on April 25, 2022. Was overall doing well from a cardiac perspective.   Admitted 07/2022 for A-fib/A-flutter with RVR. Was initiated on Amiodarone, transferred to [facility]. Underwent TEE/DCCV on 08/14/2022. TEE revealed EF 25-30%, severely reduced RV systolic function, severe mitral valve regurgitation with severe mitral stenosis - see full report below.   Today she presents for follow-up. She states she is doing well. Does admit to tremors, wants to see if her anxiety medication can be increased. Denies any chest pain, shortness of breath, palpitations, syncope, presyncope, dizziness, orthopnea, PND, swelling or significant weight changes, acute bleeding, or claudication. Expresses frustration with primary care she is receiving, requesting a new primary care provider to address her allergies.   Studies Reviewed: [name]     EKG Interpretation Date/Time:  Monday August 26 2022 10:10:01 EDT Ventricular Rate:  66 PR Interval:  192 QRS Duration:  146 QT Interval:  492 QTC Calculation: 515 R Axis:   -34  Text Interpretation: Normal sinus rhythm Left axis deviation Left bundle branch block When compared with ECG of 14-Aug-2022 10:58, Nonspecific T wave abnormality no longer evident in Lateral leads Confirmed by Sharlene Dory [phone]) on 08/26/2022 10:12:28 AM   TEE 08/14/2022:  1. Left ventricular ejection fraction, by  estimation, is 25 to 30%. The  left ventricle has severely decreased function. The left ventricle  demonstrates global hypokinesis.   2. Right ventricular systolic function is severely reduced. The right  ventricular size is mildly enlarged.   3. Left atrial size was moderately dilated. No left atrial/left atrial  appendage thrombus was detected.   4. The mitral valve is rheumatic. Severe mitral valve regurgitation.  Severe mitral stenosis.   5. The aortic valve is tricuspid. Aortic valve regurgitation is trivial.  Risk Assessment/Calculations:    CHA2DS2-VASc Score = 7  This indicates a 11.2% annual risk of stroke. The patient's score is based upon: CHF History: 1 HTN History: 1 Diabetes History: 1 Stroke History: 2 Vascular Disease History: 0 Age Score: 1 Gender Score: 1   The 10-year ASCVD risk score (Arnett DK, et al., 2019) is: 19.8%   Values used to calculate the score:     Age: 13 years     Sex: Female     Is Non-Hispanic African American: No     Diabetic: Yes     Tobacco smoker: No     Systolic Blood Pressure: 115 mmHg     Is BP treated: Yes     HDL Cholesterol: 47 mg/dL     Total Cholesterol: 213 mg/dL      Physical Exam:   VS:  BP 115/70   Pulse 66   Ht 5\' 4"  (1.626 m)   Wt 150 lb 12.8 oz (68.4 kg)   SpO2 96%   BMI 25.88 kg/m    Wt Readings from Last 3 Encounters:  08/26/22 150 lb 12.8 oz (  68.4 kg)  08/19/22 153 lb (69.4 kg)  08/15/22 145 lb 12.8 oz (66.1 kg)    GEN: Well nourished, well developed in no acute distress, appears nervous NECK: No JVD; No carotid bruits CARDIAC: S1/S2, RRR, no murmurs, rubs, gallops RESPIRATORY:  Clear to auscultation without rales, wheezing or rhonchi  ABDOMEN: Soft, non-tender, non-distended EXTREMITIES:  No edema; No deformity, mild tremors noted along bilateral forearms  ASSESSMENT AND PLAN: .    HFmrEF -> HFrEF Stage C, NYHA class I symptoms. Most likely in setting of A-fib/A-flutter with RVR. TEE/DCCV revealed  EF 25-30% from 40-45%. Euvolemic and well compensated on exam. Continue Entresto and Lopressor. At next visit, will consider starting SGLT2i if EF doesn't show improvement. Low sodium diet, fluid restriction <2L, and daily weights encouraged. Educated to contact our office for weight gain of 2 lbs overnight or 5 lbs in one week. Plan to arrange limited Echo at next follow-up now that patient is in NSR.   A-fib/A-flutter, s/p DCCV, AVNRT, s/p ablation, tremors, medication management Underwent successful DCCV. Denies any recent palpitations or tachycardia. Continue Lopressor and Amiodarone. Will obtain CBC, CMET, thyroid panel and Mag level as patient endorses tremors and last T4 level elevated. Continue coumadin and continue to f/u with Coumadin Clinic. Heart healthy diet and regular cardiovascular exercise encouraged.   Rheumatic mitral valve disease, severe mitral stenosis with severe regurgitation Past TEE revealed mitral valve was not amenable to valvuloplasty due to severity of mitral insufficiency and due to severe/asymmetrical focal calcification of mid anterior leaflet.  Severe MR and mitral valve stenosis noted on TEE 07/2022, with mean gradient at 9.5 mmHg. Dr. Diona Browner previously recommended no further surgical management or invasive management of mitral valve at this time based on TEE results from 02/2022.  Continue current medication regimen.  ED precautions discussed.  Addendum 10/01/2022: Will see if pt needs to be evaluated by structural heart team at next upcoming visit.    CHB, s/p PPM Doing well and denies any symptoms.  Normal device function noted during last device check.  Continue follow-up with Dr. Ladona Ridgel.  5. Requesting new provider Presents several requests that she states are not currently managed by her current PCP, she is requesting a new PCP. Will refer to new primary care provider.   Dispo: Follow-up with me or APP in 6-8 weeks or sooner if anything changes.    Signed, Sharlene Dory, NP

## 2022-08-27 ENCOUNTER — Telehealth: Payer: Self-pay | Admitting: Family Medicine

## 2022-08-27 NOTE — Telephone Encounter (Signed)
Contacted patient. Notified patient. Patient verbalized understanding. Patient coming in tomorrow for persistent cough

## 2022-08-28 ENCOUNTER — Encounter: Payer: Self-pay | Admitting: Family Medicine

## 2022-08-28 ENCOUNTER — Ambulatory Visit (INDEPENDENT_AMBULATORY_CARE_PROVIDER_SITE_OTHER): Payer: Medicare HMO | Admitting: Family Medicine

## 2022-08-28 VITALS — BP 99/61 | HR 72 | Temp 98.0°F | Ht 64.0 in | Wt 150.0 lb

## 2022-08-28 DIAGNOSIS — H6993 Unspecified Eustachian tube disorder, bilateral: Secondary | ICD-10-CM | POA: Diagnosis not present

## 2022-08-28 DIAGNOSIS — Z719 Counseling, unspecified: Secondary | ICD-10-CM | POA: Diagnosis not present

## 2022-08-28 DIAGNOSIS — R051 Acute cough: Secondary | ICD-10-CM

## 2022-08-28 DIAGNOSIS — F339 Major depressive disorder, recurrent, unspecified: Secondary | ICD-10-CM | POA: Diagnosis not present

## 2022-08-28 MED ORDER — CITALOPRAM HYDROBROMIDE 10 MG PO TABS
10.0000 mg | ORAL_TABLET | Freq: Every day | ORAL | 2 refills | Status: DC
Start: 2022-08-28 — End: 2023-01-06

## 2022-08-28 NOTE — Progress Notes (Signed)
Acute Office Visit  Subjective:  [name], female    DOB: 1950-10-16, 72 y.o.   MRN: [id number]  Chief Complaint  Patient presents with   Cough   Cough   Patient is in today for continued cough. States that it was "bad" at first. She called [facility] last week and was told to take Coricidin HBP due to history of hypertension. She was concerned for taking it because she was already taking tylenol for HA. She does have a cough, but only at night and it is much improved.   She also has concerns for her lymph nodes.  She states that her voice has changed due to the swollen lymph nodes. She recently had ultrasound of her soft tissue and would like to discuss results.   In addition, she would like to restart celexa. She states that she has had a hard time since her husband's passing. This occurred in October 4 years ago. She states that she feels shaky with nerves and would like to restart celexa to "take the edge off"  She states that she tries to stay inside to avoid the pollen, but it's hard because she lives alone.   Review of Systems  Respiratory:  Positive for cough.    As per HPI  Objective:  BP 99/61   Pulse 72   Temp 98 F (36.7 C)   Ht 5\' 4"  (1.626 m)   Wt 150 lb (68 kg)   SpO2 97%   BMI 25.75 kg/m   Physical Exam Constitutional:      General: She is awake. She is not in acute distress.    Appearance: Normal appearance. She is well-developed and well-groomed. She is not ill-appearing, toxic-appearing or diaphoretic.  HENT:     Right Ear: No middle ear effusion. There is no impacted cerumen.     Left Ear:  No middle ear effusion. There is no impacted cerumen. No foreign body. Tympanic membrane is scarred.     Ears:     Comments: Flaky, dry skin within bilateral ear canals.  Cardiovascular:     Rate and Rhythm: Normal rate and regular rhythm.     Pulses: Normal pulses.          Radial pulses are 2+ on the right side and 2+ on the left side.        Posterior tibial pulses are 2+ on the right side and 2+ on the left side.     Heart sounds: Normal heart sounds. No murmur heard.    No gallop.  Pulmonary:     Effort: Pulmonary effort is normal. No respiratory distress.     Breath sounds: Normal breath sounds. No stridor. No wheezing, rhonchi or rales.  Musculoskeletal:     Cervical back: Full passive range of motion without pain and neck supple.     Right lower leg: No edema.     Left lower leg: No edema.  Skin:    General: Skin is warm.     Capillary Refill: Capillary refill takes less than 2 seconds.  Neurological:     General: No focal deficit present.     Mental Status: She is alert, oriented to person, place, and time and easily aroused. Mental status is at baseline.     GCS: GCS eye subscore is 4. GCS verbal subscore is 5. GCS motor subscore is 6.     Motor: No weakness.  Psychiatric:        Attention and Perception:  Attention and perception normal.        Mood and Affect: Affect normal. Mood is anxious.        Speech: Speech normal.        Behavior: Behavior normal. Behavior is cooperative.        Thought Content: Thought content normal. Thought content does not include homicidal or suicidal ideation. Thought content does not include homicidal or suicidal plan.        Cognition and Memory: Cognition and memory normal.        Judgment: Judgment normal.    Assessment & Plan:  1. Acute cough Discussed with patient that she can continue to take coricidin as needed. Discussed for patient to monitor the amount of tylenol she takes in daily. Patient to follow up if symptoms continue and do not improve.   2. Dysfunction of both eustachian tubes Instructed patient to continue zyrtec and flonase. Offered to patient to switch from Zyrtec to allegra or xyzal, patient declined. Per patient, her insurance will not cover additional medications.   3. Depression, recurrent (HCC) Will restart medication as below. Discussed with patient  that she has to take this medication consistently. Instructed patient to follow up with PCP. Safety contract established. Denies SI.  - citalopram (CELEXA) 10 MG tablet; Take 1 tablet (10 mg total) by mouth daily.  Dispense: 30 tablet; Refill: 2  4. Encounter for health education Reviewed [place] report with patient and provided instructions for further care.   US Soft Tissue Head/Neck (Non-thyroid) IMPRESSION: Sonographic evaluation of patient's palpable area of concern within the right-side of the neck correlates with several adjacent mildly prominent though non pathologically enlarged and benign-appearing cervical lymph nodes, nonspecific though presumably reactive in etiology.   Electronically Signed   By: Simonne Come M.D.   On: 07/26/2022 13:10  The above assessment and management plan was discussed with the patient. The patient verbalized understanding of and has agreed to the management plan using shared-decision making. Patient is aware to call the clinic if they develop any new symptoms or if symptoms fail to improve or worsen. Patient is aware when to return to the clinic for a follow-up visit. Patient educated on when it is appropriate to go to the emergency department.   Return if symptoms worsen or fail to improve.  Neale Burly, DNP-FNP [facility] [address] [place], [place] [address] [phone]

## 2022-08-30 ENCOUNTER — Ambulatory Visit: Payer: Medicare HMO | Admitting: Nurse Practitioner

## 2022-09-03 ENCOUNTER — Other Ambulatory Visit: Payer: Self-pay | Admitting: Family Medicine

## 2022-09-09 ENCOUNTER — Other Ambulatory Visit
Admission: RE | Admit: 2022-09-09 | Discharge: 2022-09-09 | Disposition: A | Discharge status: Home / Self Care | Payer: Medicare HMO | Source: Ambulatory Visit | Attending: Nurse Practitioner | Admitting: Nurse Practitioner

## 2022-09-09 ENCOUNTER — Ambulatory Visit: Payer: Medicare HMO | Admitting: *Deleted

## 2022-09-09 DIAGNOSIS — Z79899 Other long term (current) drug therapy: Secondary | ICD-10-CM | POA: Diagnosis not present

## 2022-09-09 DIAGNOSIS — I4891 Unspecified atrial fibrillation: Secondary | ICD-10-CM | POA: Diagnosis not present

## 2022-09-09 DIAGNOSIS — Z5181 Encounter for therapeutic drug level monitoring: Secondary | ICD-10-CM | POA: Diagnosis not present

## 2022-09-09 LAB — MAGNESIUM: Magnesium: 1.7 mg/dL (ref 1.7–2.4)

## 2022-09-09 LAB — POCT INR: INR: 2.6 (ref 2.0–3.0)

## 2022-09-09 LAB — CBC
HCT: 37.9 % (ref 36.0–46.0)
Hemoglobin: 12.7 g/dL (ref 12.0–15.0)
MCH: 30.9 pg (ref 26.0–34.0)
MCHC: 33.5 g/dL (ref 30.0–36.0)
MCV: 92.2 fL (ref 80.0–100.0)
Platelets: 172 10*3/uL (ref 150–400)
RBC: 4.11 MIL/uL (ref 3.87–5.11)
RDW: 13.2 % (ref 11.5–15.5)
WBC: 6.1 10*3/uL (ref 4.0–10.5)
nRBC: 0 % (ref 0.0–0.2)

## 2022-09-09 LAB — BASIC METABOLIC PANEL
Anion gap: 9 (ref 5–15)
BUN: 9 mg/dL (ref 8–23)
CO2: 25 mmol/L (ref 22–32)
Calcium: 9.1 mg/dL (ref 8.9–10.3)
Chloride: 100 mmol/L (ref 98–111)
Creatinine, Ser: 0.74 mg/dL (ref 0.44–1.00)
GFR, Estimated: >60 mL/min (ref >60)
Glucose, Bld: 149 mg/dL — ABNORMAL HIGH (ref 70–99)
Potassium: 4.3 mmol/L (ref 3.5–5.1)
Sodium: 134 mmol/L — ABNORMAL LOW (ref 135–145)

## 2022-09-09 NOTE — Patient Instructions (Signed)
Continue warfarin 1 tablet daily except 1/2 tablet on Mondays, Wednesdays and Fridays. Recheck in 3 weeks

## 2022-09-10 ENCOUNTER — Other Ambulatory Visit: Payer: Self-pay | Admitting: Family Medicine

## 2022-09-10 LAB — THYROID PANEL WITH TSH
Free Thyroxine Index: 2.9 (ref 1.2–4.9)
T3 Uptake Ratio: 30 % (ref 24–39)
T4, Total: 9.7 ug/dL (ref 4.5–12.0)
TSH: 1.52 u[IU]/mL (ref 0.450–4.500)

## 2022-09-12 ENCOUNTER — Other Ambulatory Visit (HOSPITAL_COMMUNITY): Payer: Self-pay

## 2022-09-13 ENCOUNTER — Encounter (INDEPENDENT_AMBULATORY_CARE_PROVIDER_SITE_OTHER): Payer: Self-pay | Admitting: *Deleted

## 2022-09-16 ENCOUNTER — Ambulatory Visit (INDEPENDENT_AMBULATORY_CARE_PROVIDER_SITE_OTHER): Payer: Medicare HMO

## 2022-09-16 DIAGNOSIS — I442 Atrioventricular block, complete: Secondary | ICD-10-CM

## 2022-09-17 DIAGNOSIS — M5030 Other cervical disc degeneration, unspecified cervical region: Secondary | ICD-10-CM | POA: Diagnosis not present

## 2022-09-17 DIAGNOSIS — M5412 Radiculopathy, cervical region: Secondary | ICD-10-CM | POA: Diagnosis not present

## 2022-09-19 NOTE — Patient Instructions (Signed)
Our records indicate that you are due for your screening mammogram.  Please call the imaging center that does your yearly mammograms to make an appointment for a mammogram at your earliest convenience. Our office also has a mobile unit through the [facility] that comes to our location. Please call our office if you would like to make an appointment.   

## 2022-09-20 ENCOUNTER — Ambulatory Visit: Payer: Medicare HMO | Admitting: Family Medicine

## 2022-09-20 ENCOUNTER — Ambulatory Visit (INDEPENDENT_AMBULATORY_CARE_PROVIDER_SITE_OTHER): Payer: Medicare HMO | Admitting: Family Medicine

## 2022-09-20 ENCOUNTER — Encounter: Payer: Self-pay | Admitting: Family Medicine

## 2022-09-20 VITALS — BP 100/54 | HR 60 | Temp 98.6°F | Ht 64.0 in | Wt 151.4 lb

## 2022-09-20 DIAGNOSIS — F339 Major depressive disorder, recurrent, unspecified: Secondary | ICD-10-CM

## 2022-09-20 DIAGNOSIS — J309 Allergic rhinitis, unspecified: Secondary | ICD-10-CM

## 2022-09-20 DIAGNOSIS — F419 Anxiety disorder, unspecified: Secondary | ICD-10-CM

## 2022-09-20 MED ORDER — MONTELUKAST SODIUM 10 MG PO TABS
10.0000 mg | ORAL_TABLET | Freq: Every day | ORAL | 3 refills | Status: AC
Start: 2022-09-20 — End: ?

## 2022-09-20 NOTE — Progress Notes (Signed)
Acute Office Visit  Subjective:     [name], female    DOB: [birth year], 72 y.o.   MRN: [id number]  Chief Complaint  Patient presents with   Sore Throat    Sore Throat  This is a recurrent problem. The current episode started more than 1 month ago. The problem has been waxing and waning. Neither side of throat is experiencing more pain than the other. There has been no fever. Associated symptoms include coughing, ear pain, headaches and a hoarse voice. Pertinent negatives include no abdominal pain, congestion, drooling, ear discharge, shortness of breath or vomiting. Associated symptoms comments: rhinorrhea. She has tried acetaminophen (zyrtec, flonase) for the symptoms. The treatment provided mild relief.    She reports anxiety is not controlled. She feels shaky at time. She misses her decreased husband. Her daughter fusses at her for spending money and the number of doctor visits. Her family doesn't spend much time with her. She is active in her church and enjoys this. She reports compliant iwht celexa. She restarted this about 3 week ago. Reports no improvement. Takes lorazepam BID most days.      09/20/2022   10:12 AM 08/28/2022    8:04 AM 08/07/2022    8:57 AM  Depression screen PHQ 2/9  Decreased Interest 0 0 0  Down, Depressed, Hopeless 0 0 0  PHQ - 2 Score 0 0 0  Altered sleeping 0 0 0  Tired, decreased energy 0 0 0  Change in appetite 0 0 0  Feeling bad or failure about yourself  0 0 0  Trouble concentrating 0 0 0  Moving slowly or fidgety/restless 0 0 0  Suicidal thoughts 0 0 0  PHQ-9 Score 0 0 0  Difficult doing work/chores Not difficult at all Not difficult at all Not difficult at all      09/20/2022   10:12 AM 08/28/2022    8:05 AM 08/07/2022    8:57 AM 07/22/2022    9:00 AM  GAD 7 : Generalized Anxiety Score  Nervous, Anxious, on Edge 3 0 0 0  Control/stop worrying 0 0 0 0  Worry too much - different things 0 0 0 0  Trouble relaxing 2 0 0 0   Restless 1 0 0 0  Easily annoyed or irritable 0 0 0 0  Afraid - awful might happen 0 0 0 0  Total GAD 7 Score 6 0 0 0  Anxiety Difficulty Very difficult Not difficult at all Not difficult at all Not difficult at all       Review of Systems  HENT:  Positive for ear pain and hoarse voice. Negative for congestion, drooling and ear discharge.   Respiratory:  Positive for cough. Negative for shortness of breath.   Gastrointestinal:  Negative for abdominal pain and vomiting.  Neurological:  Positive for headaches.        Objective:    BP (!) 100/54   Pulse 60   Temp 98.6 F (37 C) (Temporal)   Ht 5\' 4"  (1.626 m)   Wt 151 lb 6 oz (68.7 kg)   SpO2 96%   BMI 25.98 kg/m  Wt Readings from Last 3 Encounters:  09/20/22 151 lb 6 oz (68.7 kg)  08/28/22 150 lb (68 kg)  08/26/22 150 lb 12.8 oz (68.4 kg)      Physical Exam Vitals and nursing note reviewed.  Constitutional:      General: She is not in acute distress.  Appearance: Normal appearance. She is not ill-appearing, toxic-appearing or diaphoretic.  HENT:     Right Ear: Tympanic membrane, ear canal and external ear normal.     Left Ear: Tympanic membrane, ear canal and external ear normal.     Nose:     Right Turbinates: Pale. Not enlarged or swollen.     Left Turbinates: Pale. Not enlarged or swollen.     Right Sinus: No maxillary sinus tenderness or frontal sinus tenderness.     Left Sinus: No maxillary sinus tenderness or frontal sinus tenderness.     Mouth/Throat:     Mouth: Mucous membranes are moist.     Pharynx: Oropharynx is clear.     Tonsils: No tonsillar exudate or tonsillar abscesses. 1+ on the right. 1+ on the left.  Eyes:     General: No scleral icterus.       Right eye: No discharge.        Left eye: No discharge.  Cardiovascular:     Rate and Rhythm: Normal rate and regular rhythm.     Heart sounds: Normal heart sounds. No murmur heard. Pulmonary:     Effort: Pulmonary effort is normal. No  respiratory distress.     Breath sounds: Normal breath sounds.  Abdominal:     General: Bowel sounds are normal. There is no distension.     Palpations: Abdomen is soft.     Tenderness: There is no abdominal tenderness. There is no guarding or rebound.  Musculoskeletal:     Cervical back: Neck supple. No rigidity.     Right lower leg: No edema.     Left lower leg: No edema.  Skin:    General: Skin is warm and dry.  Neurological:     Mental Status: She is alert and oriented to person, place, and time. Mental status is at baseline.  Psychiatric:        Mood and Affect: Mood is not anxious. Affect is tearful. Affect is not inappropriate.        Behavior: Behavior normal.     No results found for any visits on 09/20/22.      Assessment & Plan:   Massiah was seen today for sore throat.  Diagnoses and all orders for this visit:  Chronic allergic rhinitis Continue zyrtec and flonase. Add singular. Discussed referral to allergy if no improvement.  -     montelukast (SINGULAIR) 10 MG tablet; Take 1 tablet (10 mg total) by mouth at bedtime.  Depression, recurrent (HCC) Anxiety Continue celexa. Discussed this takes 4-6 weeks to take effect. Continue lorazepam BID. Discussed referral to itegrated BH at our office and the benefits that this would provide. She will think about this and left me know.   Keep schedule follow up appts. Sooner for new or worsening symptoms.   The patient indicates understanding of these issues and agrees with the plan.   Gabriel Earing, FNP

## 2022-09-23 ENCOUNTER — Telehealth: Payer: Self-pay | Admitting: Family Medicine

## 2022-09-23 NOTE — Telephone Encounter (Signed)
Patient aware what medication is for and voiced understanding.

## 2022-09-23 NOTE — Telephone Encounter (Signed)
Patient wants to talk to PCP or her nurse about montelukast (SINGULAIR) 10 MG tablet  She said she wants to know what it is for, already asked the pharmacy about this but only wants to talk to PCP or her nurse about it.

## 2022-09-25 ENCOUNTER — Ambulatory Visit (INDEPENDENT_AMBULATORY_CARE_PROVIDER_SITE_OTHER): Payer: Medicare HMO

## 2022-09-25 VITALS — Ht 64.0 in | Wt 151.0 lb

## 2022-09-25 DIAGNOSIS — Z0000 Encounter for general adult medical examination without abnormal findings: Secondary | ICD-10-CM

## 2022-09-25 DIAGNOSIS — Z1231 Encounter for screening mammogram for malignant neoplasm of breast: Secondary | ICD-10-CM

## 2022-09-25 DIAGNOSIS — Z780 Asymptomatic menopausal state: Secondary | ICD-10-CM | POA: Diagnosis not present

## 2022-09-25 DIAGNOSIS — Z1211 Encounter for screening for malignant neoplasm of colon: Secondary | ICD-10-CM

## 2022-09-25 NOTE — Progress Notes (Signed)
Subjective:   Carol Richmond is a 72 y.o. female who presents for Medicare Annual (Subsequent) preventive examination.  Visit Complete: Virtual  I connected with  Carol Richmond on 09/25/22 by a audio enabled telemedicine application and verified that I am speaking with the correct person using two identifiers.  Patient Location: Home  Provider Location: Home Office  I discussed the limitations of evaluation and management by telemedicine. The patient expressed understanding and agreed to proceed.  Patient Medicare AWV questionnaire was completed by the patient on 080/08/2022; I have confirmed that all information answered by patient is correct and no changes since this date.  Review of Systems    Vital Signs: Unable to obtain new vitals due to this being a telehealth visit.  Cardiac Risk Factors include: advanced age (>60men, >15 women);diabetes mellitus;dyslipidemia Nutrition Risk Assessment:  Has the patient had any N/V/D within the last 2 months?  No  Does the patient have any non-healing wounds?  No  Has the patient had any unintentional weight loss or weight gain?  No   Diabetes:  Is the patient diabetic?  Yes  If diabetic, was a CBG obtained today?  No  Did the patient bring in their glucometer from home?  No  How often do you monitor your CBG's? Daily .   Financial Strains and Diabetes Management:  Are you having any financial strains with the device, your supplies or your medication? No .  Does the patient want to be seen by Chronic Care Management for management of their diabetes?  No  Would the patient like to be referred to a Nutritionist or for Diabetic Management?  No   Diabetic Exams:  Diabetic Eye Exam: Completed 10/2021 Diabetic Foot Exam: Overdue, Pt has been advised about the importance in completing this exam. Pt is scheduled for diabetic foot exam on next office visit .     Objective:    Today's Vitals   09/25/22 1105  Weight: 151 lb  (68.5 kg)  Height: 5\' 4"  (1.626 m)   Body mass index is 25.92 kg/m.     09/25/2022   11:09 AM 08/14/2022    9:51 AM 03/01/2022    8:29 AM 01/04/2022   11:57 AM 12/14/2021    6:02 AM 09/28/2021    8:22 PM 09/28/2021   10:49 AM  Advanced Directives  Does Patient Have a Medical Advance Directive? Yes Yes No Yes Yes  No  Type of Estate agent of [place];Living will Living will;Healthcare Power of [employer] Power of [place];Living will Living will    Does patient want to make changes to medical advance directive?     No - Patient declined    Copy of Healthcare Power of Attorney in Chart? No - copy requested   No - copy requested     Would patient like information on creating a medical advance directive?      No - Patient declined     Current Medications (verified) Outpatient Encounter Medications as of 09/25/2022  Medication Sig   Accu-Chek Softclix Lancets lancets check blood sugars twice daily DX E11.65   acetaminophen (TYLENOL) 500 MG tablet Take 500 mg by mouth daily as needed for mild pain.   amiodarone (PACERONE) 200 MG tablet Take 2 tablets (400 mg total) by mouth 2 (two) times daily for 9 days, THEN 1 tablet (200 mg total) daily.   Azelastine HCl 0.15 % SOLN    Blood Pressure Monitoring (OMRON 3 SERIES BP MONITOR)  DEVI Use as directed   cetirizine (ZYRTEC ALLERGY) 10 MG tablet Take 1 tablet (10 mg total) by mouth daily.   citalopram (CELEXA) 10 MG tablet Take 1 tablet (10 mg total) by mouth daily.   fluticasone (FLONASE) 50 MCG/ACT nasal spray Place 2 sprays into both nostrils daily. (Patient taking differently: Place 2 sprays into both nostrils daily as needed for allergies.)   furosemide (LASIX) 40 MG tablet Take 40 mg by mouth daily.   gabapentin (NEURONTIN) 300 MG capsule Take 300 mg by mouth 3 (three) times daily as needed (neuropathy).   glucose blood (ACCU-CHEK GUIDE) test strip USE AS INSTRUCTED TO TEST BLOOD SUGAR DAILY AS DIRECTED.    lactulose (CHRONULAC) 10 GM/15ML solution TAKE 30 MLS (20 GRAM TOTAL) BY MOUTH 3 TIMES A DAY. (Patient taking differently: Take 20 g by mouth 3 (three) times daily.)   lamoTRIgine (LAMICTAL) 200 MG tablet TAKE (1) TABLET TWICE DAILY. (Patient taking differently: Take 200 mg by mouth 2 (two) times daily.)   LORazepam (ATIVAN) 0.5 MG tablet Take 1 tablet (0.5 mg total) by mouth 2 (two) times daily as needed for anxiety.   metFORMIN (GLUCOPHAGE) 500 MG tablet Take 1 tablet (500 mg total) by mouth 2 (two) times daily with a meal.   metoprolol tartrate (LOPRESSOR) 25 MG tablet Take 0.5 tablets (12.5 mg total) by mouth 2 (two) times daily.   montelukast (SINGULAIR) 10 MG tablet Take 1 tablet (10 mg total) by mouth at bedtime.   Multiple Vitamins-Minerals (MULTIVITAMIN WOMEN 50+) TABS Take 1 tablet by mouth daily.   ondansetron (ZOFRAN) 4 MG tablet TAKE 1 TABLET BY MOUTH EVERY 8 HOURS AS NEEDED FOR NAUSEA AND VOMITING. (Patient taking differently: Take 4 mg by mouth every 8 (eight) hours as needed for vomiting or nausea.)   pantoprazole (PROTONIX) 40 MG tablet TAKE (1) TABLET TWICE DAILY. (Patient taking differently: Take 40 mg by mouth 2 (two) times daily.)   potassium chloride SA (KLOR-CON M) 20 MEQ tablet Take 20 mEq by mouth 2 (two) times daily.   sacubitril-valsartan (ENTRESTO) 24-26 MG Take 1 tablet by mouth 2 (two) times daily.   traMADol (ULTRAM) 50 MG tablet Take 50 mg by mouth every 6 (six) hours as needed.   traZODone (DESYREL) 50 MG tablet Take 1 tablet (50 mg total) by mouth at bedtime as needed for sleep.   warfarin (COUMADIN) 5 MG tablet Take 1 tablet (5 mg total) by mouth daily. Or as directed by [facility].   No facility-administered encounter medications on file as of 09/25/2022.    Allergies (verified) Patient has no known allergies.   History: Past Medical History:  Diagnosis Date   Asthma    AVNRT (AV nodal re-entry tachycardia)    Carotid artery disease (HCC)    Cirrhosis  (HCC)    Diverticulosis of colon (without mention of hemorrhage)    Essential hypertension    Functional ovarian cysts    GERD (gastroesophageal reflux disease)    Hemorrhoids, external, without mention of complication    Hormone replacement therapy (postmenopausal)    IBS (irritable bowel syndrome)    Migraine    Mitral valve disease    Pacemaker    Complete heart block - Medtronic   Right knee DJD    Seizures (HCC)    Sleep apnea    TIA (transient ischemic attack)    Type 2 diabetes mellitus (HCC)    Past Surgical History:  Procedure Laterality Date   BIOPSY  09/23/2018  Procedure: BIOPSY;  Surgeon: Malissa Hippo, MD;  [facility]: [facility];  Service: Endoscopy;;  duodeneum and stomach   CARDIOVERSION N/A 08/14/2022   Procedure: CARDIOVERSION;  Surgeon: [name], MD;  Location: [facility] INVASIVE CV LAB;  Service: Cardiovascular;  Laterality: N/A;   CHOLECYSTECTOMY  1980   ENDARTERECTOMY Right 07/01/2017   Procedure: ENDARTERECTOMY CAROTID RIGHT;  Surgeon: Nada Libman, MD;  Location: [facility] OR;  Service: Vascular;  Laterality: Right;   ESOPHAGOGASTRODUODENOSCOPY N/A 09/23/2018   Procedure: ESOPHAGOGASTRODUODENOSCOPY (EGD);  Surgeon: Malissa Hippo, MD;  [facility]: [facility];  Service: Endoscopy;  Laterality: N/A;  2:45-moved to 1:45 per Dewayne Hatch   ESOPHAGOGASTRODUODENOSCOPY (EGD) WITH PROPOFOL N/A 03/07/2021   Procedure: ESOPHAGOGASTRODUODENOSCOPY (EGD) WITH PROPOFOL;  Surgeon: Malissa Hippo, MD;  [facility];  Service: Endoscopy;  Laterality: N/A;  12:50   LEFT HEART CATHETERIZATION WITH CORONARY ANGIOGRAM N/A 11/18/2013   Procedure: LEFT HEART CATHETERIZATION WITH CORONARY ANGIOGRAM;  Surgeon: Kathleene Hazel, MD;  Location: [facility] CATH LAB;  Service: Cardiovascular;  Laterality: N/A;   OOPHORECTOMY     2012 Dr. Donzetta Matters in [place]   PACEMAKER IMPLANT N/A 12/14/2021   Procedure: PACEMAKER IMPLANT;  Surgeon: Marinus Maw, MD;  Location: [facility] INVASIVE CV LAB;   Service: Cardiovascular;  Laterality: N/A;   PATCH ANGIOPLASTY Right 07/01/2017   Procedure: RIGHT CAROTID PATCH ANGIOPLASTY;  Surgeon: Nada Libman, MD;  Location: [facility] OR;  Service: Vascular;  Laterality: Right;   SVT ABLATION N/A 12/14/2021   Procedure: SVT ABLATION;  Surgeon: Marinus Maw, MD;  Location: [facility] INVASIVE CV LAB;  Service: Cardiovascular;  Laterality: N/A;   TEE WITHOUT CARDIOVERSION N/A 03/01/2022   Procedure: TRANSESOPHAGEAL ECHOCARDIOGRAM (TEE);  Surgeon: Thurmon Fair, MD;  Location: [facility] ENDOSCOPY;  Service: Cardiovascular;  Laterality: N/A;   TEE WITHOUT CARDIOVERSION N/A 08/14/2022   Procedure: TRANSESOPHAGEAL ECHOCARDIOGRAM;  Surgeon: [name], MD;  Location: [facility] INVASIVE CV LAB;  Service: Cardiovascular;  Laterality: N/A;   TONSILLECTOMY     TOTAL KNEE ARTHROPLASTY Right 09/02/2012   Procedure: TOTAL KNEE ARTHROPLASTY- right;  Surgeon: Loreta Ave, MD;  Location: [facility] OR;  Service: Orthopedics;  Laterality: Right;   TUBAL LIGATION     TYMPANOMASTOIDECTOMY Left 04/02/2016   Procedure: LEFT CANAL WALL DOWN TYMPANOMASTOIDECTOMY;  Surgeon: Newman Pies, MD;  Location: [facility];  Service: ENT;  Laterality: Left;   VAGINAL HYSTERECTOMY  1990   WOUND EXPLORATION Right 07/02/2017   Procedure: RIGHT NECK EXPLORATION WITH INTRAOPERATIVE ULTRASOUND;  Surgeon: Chuck Hint, MD;  Location: [facility] OR;  Service: Vascular;  Laterality: Right;   Family History  Problem Relation Age of Onset   Heart disease Mother    Diabetes Mother    Heart disease Brother    Cancer Sister    Heart disease Brother    Heart disease Brother    Heart disease Son    Social History   Socioeconomic History   Marital status: Widowed    Spouse name: Not on file   Number of children: 3   Years of education: Not on file   Highest education level: Not on file  Occupational History   Occupation: retired  Tobacco Use   Smoking status: Never   Smokeless tobacco: Never   Vaping Use   Vaping status: Never Used  Substance and Sexual Activity   Alcohol use: No    Alcohol/week: 0.0 standard drinks of alcohol   Drug use: No   Sexual activity: Not  Currently  Other Topics Concern   Not on file  Social History Narrative   Lives alone. Granddaughter lives across the street, her 3 children all live nearby   Social Determinants of Health   Financial Resource Strain: Low Risk  (09/25/2022)   Overall Financial Resource Strain (CARDIA)    Difficulty of Paying Living Expenses: Not hard at all  Food Insecurity: No Food Insecurity (09/25/2022)   Hunger Vital Sign    Worried About Running Out of Food in the Last Year: Never true    Ran Out of Food in the Last Year: Never true  Transportation Needs: No Transportation Needs (09/25/2022)   PRAPARE - Administrator, Civil Service (Medical): No    Lack of Transportation (Non-Medical): No  Physical Activity: Insufficiently Active (09/25/2022)   Exercise Vital Sign    Days of Exercise per Week: 2 days    Minutes of Exercise per Session: 30 min  Stress: No Stress Concern Present (09/25/2022)   [employer] of Occupational Health - Occupational Stress Questionnaire    Feeling of Stress : Not at all  Social Connections: Socially Integrated (09/25/2022)   Social Connection and Isolation Panel [NHANES]    Frequency of Communication with Friends and Family: More than three times a week    Frequency of Social Gatherings with Friends and Family: More than three times a week    Attends Religious Services: More than 4 times per year    Active Member of [employer] or Organizations: Yes    Attends Engineer, structural: More than 4 times per year    Marital Status: Married    Tobacco Counseling Counseling given: Not Answered   Clinical Intake:  Pre-visit preparation completed: Yes  Pain : No/denies pain     Nutritional Risks: None Diabetes: Yes CBG done?: No Did pt. bring in CBG monitor from home?:  No  How often do you need to have someone help you when you read instructions, pamphlets, or other written materials from your doctor or pharmacy?: 1 - Never  Interpreter Needed?: No  Information entered by :: Renie Ora, LPN   Activities of Daily Living    09/25/2022   11:09 AM 01/04/2022   11:45 AM  In your present state of health, do you have any difficulty performing the following activities:  Hearing? 0 0  Vision? 0 0  Difficulty concentrating or making decisions? 0 0  Walking or climbing stairs? 0 0  Dressing or bathing? 0 0  Doing errands, shopping? 0 0  Preparing Food and eating ? N N  Using the Toilet? N N  In the past six months, have you accidently leaked urine? N N  Do you have problems with loss of bowel control? N N  Managing your Medications? N N  Managing your Finances? N N  Housekeeping or managing your Housekeeping? N N    Patient Care Team: Gabriel Earing, FNP as PCP - General (Family Medicine) Jonelle Sidle, MD as PCP - Cardiology (Cardiology) Marinus Maw, MD as PCP - Electrophysiology (Cardiology) Van Clines, MD as Consulting Physician (Neurology) Malissa Hippo, MD (Inactive) as Consulting Physician (Gastroenterology) Dr Desiree Lucy Optometrist, [place], OD (Optometry) Chuck Hint, MD as Consulting Physician (Vascular Surgery) Danella Maiers, [facility] as Pharmacist (Family Medicine) Audrie Gallus, RN as [facility] Care Management Sharlene Dory, NP as Nurse Practitioner (Cardiology)  Indicate any recent Medical Services you may have received from other than [facility] providers  in the past year (date may be approximate).     Assessment:   This is a routine wellness examination for Dil[place].  Hearing/Vision screen Vision Screening - Comments:: Wears rx glasses - up to date with routine eye exams with  Dr.Davis   Dietary issues and exercise activities discussed:     Goals Addressed             This  Visit's Progress    DIET - INCREASE WATER INTAKE         Depression Screen    09/25/2022   11:08 AM 09/20/2022   10:12 AM 08/28/2022    8:04 AM 08/07/2022    8:57 AM 08/07/2022    8:56 AM 07/30/2022    8:51 AM 07/22/2022    8:59 AM  PHQ 2/9 Scores  PHQ - 2 Score 0 0 0 0 0 0 0  PHQ- 9 Score 0 0 0 0 0 0 0    Fall Risk    09/25/2022   11:07 AM 09/20/2022   10:12 AM 08/28/2022    8:05 AM 08/19/2022   10:31 AM 07/22/2022    8:59 AM  Fall Risk   Falls in the past year? 0 0 0 1 1  Number falls in past yr: 0  0 0 0  Injury with Fall? 0  0 0   Risk for fall due to : No Fall Risks  No Fall Risks No Fall Risks No Fall Risks  Follow up Falls prevention discussed  Falls evaluation completed Education provided Falls evaluation completed;Education provided    MEDICARE RISK AT HOME:  Medicare Risk at Home - 09/25/22 1107     Any stairs in or around the home? No    If so, are there any without handrails? No    Home free of loose throw rugs in walkways, pet beds, electrical cords, etc? Yes    Adequate lighting in your home to reduce risk of falls? Yes    Life alert? No    Use of a cane, walker or w/c? No    Grab bars in the bathroom? No    Shower chair or bench in shower? No    Elevated toilet seat or a handicapped toilet? No             TIMED UP AND GO:  Was the test performed?  No    Cognitive Function:        09/25/2022   11:09 AM 07/05/2021    1:31 PM 07/04/2020   12:40 PM  6CIT Screen  What Year? 0 points 0 points 0 points  What month? 0 points 0 points 0 points  What time? 0 points 0 points 0 points  Count back from 20 0 points 0 points 0 points  Months in reverse 0 points 2 points 0 points  Repeat phrase 0 points 4 points 0 points  Total Score 0 points 6 points 0 points    Immunizations Immunization History  Administered Date(s) Administered   Fluad Quad(high Dose 65+) 11/20/2020, 12/03/2021   Influenza Split 12/02/2012   Influenza,inj,Quad PF,6+ Mos 12/08/2018    Influenza,inj,quad, With Preservative 11/28/2017, 12/05/2018   Influenza-Unspecified 01/19/2020   Moderna Sars-Covid-2 Vaccination 04/15/2019, 05/14/2019, 02/29/2020   PNEUMOCOCCAL CONJUGATE-20 12/21/2020   Tdap 12/13/2002    TDAP status: Due, Education has been provided regarding the importance of this vaccine. Advised may receive this vaccine at local pharmacy or Health Dept. Aware to provide a copy of the vaccination record if obtained  from local pharmacy or Health Dept. Verbalized acceptance and understanding.  Flu Vaccine status: Up to date  Pneumococcal vaccine status: Up to date  Covid-19 vaccine status: Completed vaccines  Qualifies for Shingles Vaccine? Yes   Zostavax completed No   Shingrix Completed?: No.    Education has been provided regarding the importance of this vaccine. Patient has been advised to call insurance company to determine out of pocket expense if they have not yet received this vaccine. Advised may also receive vaccine at local pharmacy or Health Dept. Verbalized acceptance and understanding.  Screening Tests Health Maintenance  Topic Date Due   OPHTHALMOLOGY EXAM  Never done   DTaP/Tdap/Td (2 - Td or Tdap) 12/12/2012   Colonoscopy  06/03/2022   DEXA SCAN  12/04/2022 (Originally 06/16/2017)   Zoster Vaccines- Shingrix (1 of 2) 12/14/2022 (Originally 06/17/1969)   COVID-19 Vaccine (4 - 2023-24 season) 03/23/2023 (Originally 10/19/2021)   INFLUENZA VACCINE  05/19/2023 (Originally 09/19/2022)   MAMMOGRAM  10/31/2022   Diabetic kidney evaluation - Urine ACR  12/04/2022   FOOT EXAM  12/04/2022   HEMOGLOBIN A1C  02/13/2023   Diabetic kidney evaluation - eGFR measurement  09/09/2023   Medicare Annual Wellness (AWV)  09/25/2023   Pneumonia Vaccine 65+ Years old  Completed   Hepatitis C Screening  Completed   HPV VACCINES  Aged Out    Health Maintenance  Health Maintenance Due  Topic Date Due   OPHTHALMOLOGY EXAM  Never done   DTaP/Tdap/Td (2 - Td or Tdap)  12/12/2012   Colonoscopy  06/03/2022    Colorectal cancer screening: Referral to GI placed 09/25/2022. Pt aware the office will call re: appt.  Mammogram status: Ordered schedule 10/31/2022. Pt provided with contact info and advised to call to schedule appt.   Bone Density status: Ordered 09/25/2022. Pt provided with contact info and advised to call to schedule appt.  Lung Cancer Screening: (Low Dose CT Chest recommended if Age 9-80 years, 20 pack-year currently smoking OR have quit w/in 15years.) does not qualify.   Lung Cancer Screening Referral: n/a  Additional Screening:  Hepatitis C Screening: does not qualify; Completed 10/30/2020  Vision Screening: Recommended annual ophthalmology exams for early detection of glaucoma and other disorders of the eye. Is the patient up to date with their annual eye exam?  No  Who is the provider or what is the name of the office in which the patient attends annual eye exams? Dr.Davis patient to call schedule  If pt is not established with a provider, would they like to be referred to a provider to establish care? No .   Dental Screening: Recommended annual dental exams for proper oral hygiene    Community Resource Referral / Chronic Care Management: CRR required this visit?  No   CCM required this visit?  No     Plan:     I have personally reviewed and noted the following in the patient's chart:   Medical and social history Use of alcohol, tobacco or illicit drugs  Current medications and supplements including opioid prescriptions. Patient is not currently taking opioid prescriptions. Functional ability and status Nutritional status Physical activity Advanced directives List of other physicians Hospitalizations, surgeries, and ER visits in previous 12 months Vitals Screenings to include cognitive, depression, and falls Referrals and appointments  In addition, I have reviewed and discussed with patient certain preventive  protocols, quality metrics, and best practice recommendations. A written personalized care plan for preventive services as well as general preventive health  recommendations were provided to patient.     Lorrene Reid, LPN   02/23/1094   After Visit Summary: (MyChart) Due to this being a telephonic visit, the after visit summary with patients personalized plan was offered to patient via MyChart   Nurse Notes: Due TDAP Vaccine

## 2022-09-25 NOTE — Patient Instructions (Signed)
Carol Richmond , Thank you for taking time to come for your Medicare Wellness Visit. I appreciate your ongoing commitment to your health goals. Please review the following plan we discussed and let me know if I can assist you in the future.   Referrals/Orders/Follow-Ups/Clinician Recommendations: Aim for 30 minutes of exercise or brisk walking, 6-8 glasses of water, and 5 servings of fruits and vegetables each day.   This is a list of the screening recommended for you and due dates:  Health Maintenance  Topic Date Due   Eye exam for diabetics  Never done   DTaP/Tdap/Td vaccine (2 - Td or Tdap) 12/12/2012   Colon Cancer Screening  06/03/2022   DEXA scan (bone density measurement)  12/04/2022*   Zoster (Shingles) Vaccine (1 of 2) 12/14/2022*   COVID-19 Vaccine (4 - 2023-24 season) 03/23/2023*   Flu Shot  05/19/2023*   Mammogram  10/31/2022   Yearly kidney health urinalysis for diabetes  12/04/2022   Complete foot exam   12/04/2022   Hemoglobin A1C  02/13/2023   Yearly kidney function blood test for diabetes  09/09/2023   Medicare Annual Wellness Visit  09/25/2023   Pneumonia Vaccine  Completed   Hepatitis C Screening  Completed   HPV Vaccine  Aged Out  *Topic was postponed. The date shown is not the original due date.    Advanced directives: (Copy Requested) Please bring a copy of your health care power of attorney and living will to the office to be added to your chart at your convenience.  Next Medicare Annual Wellness Visit scheduled for next year: Yes  Preventive Care 70 Years and Older, Female Preventive care refers to lifestyle choices and visits with your health care provider that can promote health and wellness. What does preventive care include? A yearly physical exam. This is also called an annual well check. Dental exams once or twice a year. Routine eye exams. Ask your health care provider how often you should have your eyes checked. Personal lifestyle choices,  including: Daily care of your teeth and gums. Regular physical activity. Eating a healthy diet. Avoiding tobacco and drug use. Limiting alcohol use. Practicing safe sex. Taking low-dose aspirin every day. Taking vitamin and mineral supplements as recommended by your health care provider. What happens during an annual well check? The services and screenings done by your health care provider during your annual well check will depend on your age, overall health, lifestyle risk factors, and family history of disease. Counseling  Your health care provider may ask you questions about your: Alcohol use. Tobacco use. Drug use. Emotional well-being. Home and relationship well-being. Sexual activity. Eating habits. History of falls. Memory and ability to understand (cognition). Work and work Astronomer. Reproductive health. Screening  You may have the following tests or measurements: Height, weight, and BMI. Blood pressure. Lipid and cholesterol levels. These may be checked every 5 years, or more frequently if you are over 64 years old. Skin check. Lung cancer screening. You may have this screening every year starting at age 34 if you have a 30-pack-year history of smoking and currently smoke or have quit within the past 15 years. Fecal occult blood test (FOBT) of the stool. You may have this test every year starting at age 99. Flexible sigmoidoscopy or colonoscopy. You may have a sigmoidoscopy every 5 years or a colonoscopy every 10 years starting at age 28. Hepatitis C blood test. Hepatitis B blood test. Sexually transmitted disease (STD) testing. Diabetes screening. This is done  by checking your blood sugar (glucose) after you have not eaten for a while (fasting). You may have this done every 1-3 years. Bone density scan. This is done to screen for osteoporosis. You may have this done starting at age 37. Mammogram. This may be done every 1-2 years. Talk to your health care provider  about how often you should have regular mammograms. Talk with your health care provider about your test results, treatment options, and if necessary, the need for more tests. Vaccines  Your health care provider may recommend certain vaccines, such as: Influenza vaccine. This is recommended every year. Tetanus, diphtheria, and acellular pertussis (Tdap, Td) vaccine. You may need a Td booster every 10 years. Zoster vaccine. You may need this after age 67. Pneumococcal 13-valent conjugate (PCV13) vaccine. One dose is recommended after age 82. Pneumococcal polysaccharide (PPSV23) vaccine. One dose is recommended after age 37. Talk to your health care provider about which screenings and vaccines you need and how often you need them. This information is not intended to replace advice given to you by your health care provider. Make sure you discuss any questions you have with your health care provider. Document Released: 03/03/2015 Document Revised: 10/25/2015 Document Reviewed: 12/06/2014 Elsevier Interactive Patient Education  2017 [employer].  Fall Prevention in the Home Falls can cause injuries. They can happen to people of all ages. There are many things you can do to make your home safe and to help prevent falls. What can I do on the outside of my home? Regularly fix the edges of walkways and driveways and fix any cracks. Remove anything that might make you trip as you walk through a door, such as a raised step or threshold. Trim any bushes or trees on the path to your home. Use bright outdoor lighting. Clear any walking paths of anything that might make someone trip, such as rocks or tools. Regularly check to see if handrails are loose or broken. Make sure that both sides of any steps have handrails. Any raised decks and porches should have guardrails on the edges. Have any leaves, snow, or ice cleared regularly. Use sand or salt on walking paths during winter. Clean up any spills in  your garage right away. This includes oil or grease spills. What can I do in the bathroom? Use night lights. Install grab bars by the toilet and in the tub and shower. Do not use towel bars as grab bars. Use non-skid mats or decals in the tub or shower. If you need to sit down in the shower, use a plastic, non-slip stool. Keep the floor dry. Clean up any water that spills on the floor as soon as it happens. Remove soap buildup in the tub or shower regularly. Attach bath mats securely with double-sided non-slip rug tape. Do not have throw rugs and other things on the floor that can make you trip. What can I do in the bedroom? Use night lights. Make sure that you have a light by your bed that is easy to reach. Do not use any sheets or blankets that are too big for your bed. They should not hang down onto the floor. Have a firm chair that has side arms. You can use this for support while you get dressed. Do not have throw rugs and other things on the floor that can make you trip. What can I do in the kitchen? Clean up any spills right away. Avoid walking on wet floors. Keep items that you use  a lot in easy-to-reach places. If you need to reach something above you, use a strong step stool that has a grab bar. Keep electrical cords out of the way. Do not use floor polish or wax that makes floors slippery. If you must use wax, use non-skid floor wax. Do not have throw rugs and other things on the floor that can make you trip. What can I do with my stairs? Do not leave any items on the stairs. Make sure that there are handrails on both sides of the stairs and use them. Fix handrails that are broken or loose. Make sure that handrails are as long as the stairways. Check any carpeting to make sure that it is firmly attached to the stairs. Fix any carpet that is loose or worn. Avoid having throw rugs at the top or bottom of the stairs. If you do have throw rugs, attach them to the floor with carpet  tape. Make sure that you have a light switch at the top of the stairs and the bottom of the stairs. If you do not have them, ask someone to add them for you. What else can I do to help prevent falls? Wear shoes that: Do not have high heels. Have rubber bottoms. Are comfortable and fit you well. Are closed at the toe. Do not wear sandals. If you use a stepladder: Make sure that it is fully opened. Do not climb a closed stepladder. Make sure that both sides of the stepladder are locked into place. Ask someone to hold it for you, if possible. Clearly mark and make sure that you can see: Any grab bars or handrails. First and last steps. Where the edge of each step is. Use tools that help you move around (mobility aids) if they are needed. These include: Canes. Walkers. Scooters. Crutches. Turn on the lights when you go into a dark area. Replace any light bulbs as soon as they burn out. Set up your furniture so you have a clear path. Avoid moving your furniture around. If any of your floors are uneven, fix them. If there are any pets around you, be aware of where they are. Review your medicines with your doctor. Some medicines can make you feel dizzy. This can increase your chance of falling. Ask your doctor what other things that you can do to help prevent falls. This information is not intended to replace advice given to you by your health care provider. Make sure you discuss any questions you have with your health care provider. Document Released: 12/01/2008 Document Revised: 07/13/2015 Document Reviewed: 03/11/2014 Elsevier Interactive Patient Education  2017 [employer].

## 2022-09-30 ENCOUNTER — Encounter (INDEPENDENT_AMBULATORY_CARE_PROVIDER_SITE_OTHER): Payer: Self-pay | Admitting: Gastroenterology

## 2022-09-30 ENCOUNTER — Ambulatory Visit (INDEPENDENT_AMBULATORY_CARE_PROVIDER_SITE_OTHER): Payer: Medicare HMO | Admitting: Gastroenterology

## 2022-09-30 ENCOUNTER — Ambulatory Visit: Payer: Medicare HMO | Attending: Cardiology | Admitting: *Deleted

## 2022-09-30 ENCOUNTER — Telehealth (INDEPENDENT_AMBULATORY_CARE_PROVIDER_SITE_OTHER): Payer: Self-pay | Admitting: *Deleted

## 2022-09-30 VITALS — BP 108/69 | HR 79 | Temp 98.0°F | Ht 64.0 in | Wt 155.6 lb

## 2022-09-30 DIAGNOSIS — I8510 Secondary esophageal varices without bleeding: Secondary | ICD-10-CM

## 2022-09-30 DIAGNOSIS — K7682 Hepatic encephalopathy: Secondary | ICD-10-CM

## 2022-09-30 DIAGNOSIS — Z5181 Encounter for therapeutic drug level monitoring: Secondary | ICD-10-CM

## 2022-09-30 DIAGNOSIS — K7460 Unspecified cirrhosis of liver: Secondary | ICD-10-CM | POA: Diagnosis not present

## 2022-09-30 DIAGNOSIS — K146 Glossodynia: Secondary | ICD-10-CM

## 2022-09-30 DIAGNOSIS — I4891 Unspecified atrial fibrillation: Secondary | ICD-10-CM

## 2022-09-30 DIAGNOSIS — I8500 Esophageal varices without bleeding: Secondary | ICD-10-CM | POA: Insufficient documentation

## 2022-09-30 LAB — POCT INR: INR: 2.5 (ref 2.0–3.0)

## 2022-09-30 MED ORDER — LACTULOSE 10 GM/15ML PO SOLN: 20.0000 g | Freq: Three times a day (TID) | ORAL | 3 refills | Status: DC

## 2022-09-30 MED ORDER — LIDOCAINE VISCOUS HCL 2 % MT SOLN
5.0000 mL | Freq: Three times a day (TID) | ORAL | 3 refills | Status: DC | PRN
Start: 2022-09-30 — End: 2023-01-06

## 2022-09-30 NOTE — Patient Instructions (Signed)
Continue warfarin 1 tablet daily except 1/2 tablet on Mondays, Wednesdays and Fridays Recheck in 4 weeks 

## 2022-09-30 NOTE — Patient Instructions (Addendum)
-   Will discuss with Dr. Diona Browner possibility of switching metoprolol to carvedilol - will also ask about procedural risk and clearance for colonoscopy under propofol sedation - Check CBC, MELD labs and AFP - Schedule liver US - Reduce salt intake to <2 g per day - Can take Tylenol max of 2 g per day (650 mg q8h) for pain - Continue lactulose to achieve 2-3 bowel movements per day - Avoid NSAIDs for pain - Avoid eating raw oysters/shellfish - Protein shake (Ensure or Boost) every night before going to sleep

## 2022-09-30 NOTE — Progress Notes (Signed)
[name], M.D. Gastroenterology & Hepatology [facility] Gastroenterology [address] [place], [place] [address]  Primary Care Physician: Gabriel Earing, FNP [address] [place] [address]  I will communicate my assessment and recommendations to the referring MD via EMR.  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 female with PMH NASH cirrhosis with G1 EV, GERD, SVT, who presents for follow up of  cirrhosis.  The patient was last seen on 03/28/2022. 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  She was given Magic mouthwash refills and was advised to continue pantoprazole 40 mg twice daily.  Patietn was admitted to [facility] on 07/2022 after presenting afib with RVR. Was started on amiodarone and underwent DCCB. She was started on coumadin for this.  Most recent TTE on 08/14/22 showed severely decreased EF 25-30% with left ventricular hypokinesis, severe mitral valve regurgitation and stenosis.  She is currently on Coumadin and metoprolol for A-fib.  States her tongue is feeling better now, but she may have intermittent flares of tongue pain. Would like a refill of magic mouthwash. The patient denies having any other complaints including nausea, vomiting, fever, chills, hematochezia, melena, hematemesis, abdominal distention, abdominal pain, diarrhea, jaundice, pruritus or weight loss.  Cirrhosis related questions: Hematemesis/coffee ground emesis: No Abdominal pain: No Abdominal distention/worsening ascitesNo Fever/chills: No Number of Bms per day - 3-4 times, takes lactulose 3 times a day. Episodes of confusion/disorientation: No Taking diuretics?: yes, furosemide 40 mg qday History of variceal bleeding: No Prior history of banding?: No Prior episodes of SBP: No Last  time liver imaging was performed: 04/05/22-  [place] no masses ion liver Last AFP:  03/29/22  - 3.7 MELD score: 03/29/22  - 7 Currently consuming alcohol: No Hepatitis A and B vaccination status: Immune to hepatitis A, states she was vaccinated for hepatitis B in 2023    Last Colonoscopy: 05/2017  normal colonoscopy, recommend repeat in 5 years  Last Endoscopy:- 03/07/21 Normal hypopharynx. - Normal proximal esophagus and mid esophagus. - Grade I esophageal varices. - Z-line irregular, 37 cm from the incisors. - Portal hypertensive gastropathy. - Food was found in the stomach. - Erosive gastropathy with no stigmata of recent bleeding(secondary to NSAID; gastric biopsy negative for H. pylori in August 2020) - Normal duodenal bulb and second portion of the duodenum. - No specimens collected.  Past Medical History: Past Medical History:  Diagnosis Date   Asthma    AVNRT (AV nodal re-entry tachycardia)    Carotid artery disease (HCC)    Cirrhosis (HCC)    Diverticulosis of colon (without mention of hemorrhage)    Essential hypertension    Functional ovarian cysts    GERD (gastroesophageal reflux disease)    Hemorrhoids, external, without mention of complication    Hormone replacement therapy (postmenopausal)    IBS (irritable bowel syndrome)    Migraine    Mitral valve disease    Pacemaker    Complete heart block - [employer]tr[employer]   Right knee DJD    Seizures (HCC)    Sleep apnea    TIA (transient ischemic attack)    Type 2 diabetes mellitus (HCC)     Past Surgical History: Past Surgical History:  Procedure Laterality Date   BIOPSY  09/23/2018   Procedure: BIOPSY;  Surgeon: Malissa Hippo, MD;  [facility];  Service: Endoscopy;;  duodeneum and stomach   CARDIOVERSION N/A 08/14/2022   Procedure: CARDIOVERSION;  Surgeon: [name], MD;  Location: [facility] INVASIVE CV LAB;  Service: Cardiovascular;  Laterality: N/A;   CHOLECYSTECTOMY  1980   ENDARTERECTOMY Right 07/01/2017    Procedure: ENDARTERECTOMY CAROTID RIGHT;  Surgeon: Nada Libman, MD;  Location: [facility] OR;  Service: Vascular;  Laterality: Right;   ESOPHAGOGASTRODUODENOSCOPY N/A 09/23/2018   Procedure: ESOPHAGOGASTRODUODENOSCOPY (EGD);  Surgeon: Malissa Hippo, MD;  [facility]: [facility];  Service: Endoscopy;  Laterality: N/A;  2:45-moved to 1:45 per Dewayne Hatch   ESOPHAGOGASTRODUODENOSCOPY (EGD) WITH PROPOFOL N/A 03/07/2021   Procedure: ESOPHAGOGASTRODUODENOSCOPY (EGD) WITH PROPOFOL;  Surgeon: Malissa Hippo, MD;  [facility]: [facility];  Service: Endoscopy;  Laterality: N/A;  12:50   LEFT HEART CATHETERIZATION WITH CORONARY ANGIOGRAM N/A 11/18/2013   Procedure: LEFT HEART CATHETERIZATION WITH CORONARY ANGIOGRAM;  Surgeon: Kathleene Hazel, MD;  Location: [facility] CATH LAB;  Service: Cardiovascular;  Laterality: N/A;   OOPHORECTOMY     2012 Dr. Donzetta Matters in [place]   PACEMAKER IMPLANT N/A 12/14/2021   Procedure: PACEMAKER IMPLANT;  Surgeon: Marinus Maw, MD;  Location: [facility] INVASIVE CV LAB;  Service: Cardiovascular;  Laterality: N/A;   PATCH ANGIOPLASTY Right 07/01/2017   Procedure: RIGHT CAROTID PATCH ANGIOPLASTY;  Surgeon: Nada Libman, MD;  Location: [facility] OR;  Service: Vascular;  Laterality: Right;   SVT ABLATION N/A 12/14/2021   Procedure: SVT ABLATION;  Surgeon: Marinus Maw, MD;  [facility]: [facility] INVASIVE CV LAB;  Service: Cardiovascular;  Laterality: N/A;   TEE WITHOUT CARDIOVERSION N/A 03/01/2022   Procedure: TRANSESOPHAGEAL ECHOCARDIOGRAM (TEE);  Surgeon: Thurmon Fair, MD;  Location: [facility] ENDOSCOPY;  Service: Cardiovascular;  Laterality: N/A;   TEE WITHOUT CARDIOVERSION N/A 08/14/2022   Procedure: TRANSESOPHAGEAL ECHOCARDIOGRAM;  Surgeon: [name], MD;  Location: [facility] INVASIVE CV LAB;  Service: Cardiovascular;  Laterality: N/A;   TONSILLECTOMY     TOTAL KNEE ARTHROPLASTY Right 09/02/2012   Procedure: TOTAL KNEE ARTHROPLASTY- right;  Surgeon: Loreta Ave, MD;  Location: [facility] OR;  Service: Orthopedics;   Laterality: Right;   TUBAL LIGATION     TYMPANOMASTOIDECTOMY Left 04/02/2016   Procedure: LEFT CANAL WALL DOWN TYMPANOMASTOIDECTOMY;  Surgeon: Newman Pies, MD;  Location: [facility];  Service: ENT;  Laterality: Left;   VAGINAL HYSTERECTOMY  1990   WOUND EXPLORATION Right 07/02/2017   Procedure: RIGHT NECK EXPLORATION WITH INTRAOPERATIVE ULTRASOUND;  Surgeon: Chuck Hint, MD;  Location: [facility] OR;  Service: Vascular;  Laterality: Right;    Family History: Family History  Problem Relation Age of Onset   Heart disease Mother    Diabetes Mother    Heart disease Brother    Cancer Sister    Heart disease Brother    Heart disease Brother    Heart disease Son     Social History: Social History   Tobacco Use  Smoking Status Never  Smokeless Tobacco Never   Social History   Substance and Sexual Activity  Alcohol Use No   Alcohol/week: 0.0 standard drinks of alcohol   Social History   Substance and Sexual Activity  Drug Use No    Allergies: No Known Allergies  Medications: Current Outpatient Medications  Medication Sig Dispense Refill   Accu-Chek Softclix Lancets lancets check blood sugars twice daily DX E11.65 200 each 3   acetaminophen (TYLENOL) 500 MG tablet Take 500 mg by mouth daily as needed for mild pain.  amiodarone (PACERONE) 200 MG tablet Take 2 tablets (400 mg total) by mouth 2 (two) times daily for 9 days, THEN 1 tablet (200 mg total) daily. 126 tablet 0   Azelastine HCl 0.15 % SOLN      Blood Pressure Monitoring (OMRON 3 SERIES BP MONITOR) DEVI Use as directed 1 each 0   cetirizine (ZYRTEC ALLERGY) 10 MG tablet Take 1 tablet (10 mg total) by mouth daily. 90 tablet 1   citalopram (CELEXA) 10 MG tablet Take 1 tablet (10 mg total) by mouth daily. 30 tablet 2   fluticasone (FLONASE) 50 MCG/ACT nasal spray Place 2 sprays into both nostrils daily. (Patient taking differently: Place 2 sprays into both nostrils daily as needed for allergies.) 16 g 6    furosemide (LASIX) 40 MG tablet Take 40 mg by mouth daily.     gabapentin (NEURONTIN) 300 MG capsule Take 300 mg by mouth 3 (three) times daily as needed (neuropathy).     glucose blood (ACCU-CHEK GUIDE) test strip USE AS INSTRUCTED TO TEST BLOOD SUGAR DAILY AS DIRECTED. 50 each 11   lactulose (CHRONULAC) 10 GM/15ML solution TAKE 30 MLS (20 GRAM TOTAL) BY MOUTH 3 TIMES A DAY. (Patient taking differently: Take 20 g by mouth 3 (three) times daily.) 2700 mL 3   lamoTRIgine (LAMICTAL) 200 MG tablet TAKE (1) TABLET TWICE DAILY. (Patient taking differently: Take 200 mg by mouth 2 (two) times daily.) 180 tablet 3   LORazepam (ATIVAN) 0.5 MG tablet Take 1 tablet (0.5 mg total) by mouth 2 (two) times daily as needed for anxiety. 60 tablet 5   metFORMIN (GLUCOPHAGE) 500 MG tablet Take 1 tablet (500 mg total) by mouth 2 (two) times daily with a meal. 120 tablet 3   metoprolol tartrate (LOPRESSOR) 25 MG tablet Take 0.5 tablets (12.5 mg total) by mouth 2 (two) times daily. 30 tablet 3   montelukast (SINGULAIR) 10 MG tablet Take 1 tablet (10 mg total) by mouth at bedtime. 30 tablet 3   Multiple Vitamins-Minerals (MULTIVITAMIN WOMEN 50+) TABS Take 1 tablet by mouth daily.     ondansetron (ZOFRAN) 4 MG tablet TAKE 1 TABLET BY MOUTH EVERY 8 HOURS AS NEEDED FOR NAUSEA AND VOMITING. (Patient taking differently: Take 4 mg by mouth every 8 (eight) hours as needed for vomiting or nausea.) 60 tablet 0   pantoprazole (PROTONIX) 40 MG tablet TAKE (1) TABLET TWICE DAILY. (Patient taking differently: Take 40 mg by mouth 2 (two) times daily.) 60 tablet 5   potassium chloride SA (KLOR-CON M) 20 MEQ tablet Take 20 mEq by mouth 2 (two) times daily.     sacubitril-valsartan (ENTRESTO) 24-26 MG Take 1 tablet by mouth 2 (two) times daily. 180 tablet 3   traMADol (ULTRAM) 50 MG tablet Take 50 mg by mouth every 6 (six) hours as needed.     traZODone (DESYREL) 50 MG tablet Take 1 tablet (50 mg total) by mouth at bedtime as needed for  sleep. 90 tablet 3   warfarin (COUMADIN) 5 MG tablet Take 1 tablet (5 mg total) by mouth daily. Or as directed by Coumadin Clinic. 45 tablet 1   No current facility-administered medications for this visit.    Review of Systems: GENERAL: negative for malaise, night sweats HEENT: No changes in hearing or vision, no nose bleeds or other nasal problems. NECK: Negative for lumps, goiter, pain and significant neck swelling RESPIRATORY: Negative for cough, wheezing CARDIOVASCULAR: Negative for chest pain, leg swelling, palpitations, orthopnea GI: SEE HPI MUSCULOSKELETAL: Negative for  joint pain or swelling, back pain, and muscle pain. SKIN: Negative for lesions, rash PSYCH: Negative for sleep disturbance, mood disorder and recent psychosocial stressors. HEMATOLOGY Negative for prolonged bleeding, bruising easily, and swollen nodes. ENDOCRINE: Negative for cold or heat intolerance, polyuria, polydipsia and goiter. NEURO: negative for tremor, gait imbalance, syncope and seizures. The remainder of the review of systems is noncontributory.   Physical Exam: BP 108/69 (BP Location: Left Arm, Patient Position: Sitting, Cuff Size: Normal)   Pulse 79   Temp 98 F (36.7 C) (Oral)   Ht 5\' 4"  (1.626 m)   Wt 155 lb 9.6 oz (70.6 kg)   BMI 26.71 kg/m  GENERAL: The patient is AO x3, in no acute distress. HEENT: Head is normocephalic and atraumatic. EOMI are intact. Mouth is well hydrated and without lesions. NECK: Supple. No masses LUNGS: Clear to auscultation. No presence of rhonchi/wheezing/rales. Adequate chest expansion HEART: RRR, normal s1 and s2. ABDOMEN: Soft, nontender, no guarding, no peritoneal signs, and nondistended. BS +. No masses. EXTREMITIES: Without any cyanosis, clubbing, rash, lesions or edema. NEUROLOGIC: AOx3, no focal motor deficit. SKIN: no jaundice, no rashes  Imaging/Labs: as above  I personally reviewed and interpreted the available labs, imaging and endoscopic  files.  Impression and Plan: Carol Richmond is a 72 y.o. female with PMH NASH cirrhosis with G1 EV, GERD, SVT, who presents for follow up of cirrhosis.  Patient has been relatively stable and denies having any complaints.  She has not presented any decompensating events and has had a low MELD score.  We will update her ultrasound and AFP for HCC screening, as well as her MELD labs for surveillance.  She has not presented any issues with encephalopathy but has been taking lactulose chronically.  Will refill this medication, she should aim for 2-3 bowel movements per day.  Notably, the patient had presence of small varices in her most recent EGD.  She is supposed to have a repeat EGD in January 2025 for surveillance.  However, she has evidence of severe mitral stenosis and low ejection fraction.  We discussed in detail that there is increase perioperative risk with this cardiac findings.  I discussed with her that I will reach Dr. Diona Browner to obtain clearance for potentially doing esophagogastroduodenospy and colonoscopy (patient is overdue for colorectal cancer screening), but the risks of the procedures under probable sedation may be higher than the benefits.  She understood this.  I will also discussed with Dr. Diona Browner the possibility of switching her metoprolol to carvedilol as this may delay the progression of the esophageal varices.  Regarding her burning mouth syndrome, Magic mouthwash has led to significant improvement of her symptoms.  I will refill this today  - Will discuss with Dr. Diona Browner possibility of switching metoprolol to carvedilol - will also ask about procedural risk and clearance for colonoscopy under propofol sedation - Check CBC, MELD labs and AFP - Schedule liver US - Reduce salt intake to <2 g per day - Can take Tylenol max of 2 g per day (650 mg q8h) for pain - Continue lactulose to achieve 2-3 bowel movements per day - Avoid NSAIDs for pain - Avoid eating raw  oysters/shellfish - Protein shake (Ensure or Boost) every night before going to sleep -Take Magic mouthwash as needed  All questions were answered.      [name], MD Gastroenterology and Hepatology [facility] Gastroenterology

## 2022-09-30 NOTE — Telephone Encounter (Signed)
Called pt and she is aware of [place] appt details.

## 2022-10-02 ENCOUNTER — Other Ambulatory Visit (HOSPITAL_COMMUNITY): Payer: Self-pay

## 2022-10-03 DIAGNOSIS — K7460 Unspecified cirrhosis of liver: Secondary | ICD-10-CM | POA: Diagnosis not present

## 2022-10-03 NOTE — Progress Notes (Signed)
Remote pacemaker transmission.   

## 2022-10-07 ENCOUNTER — Ambulatory Visit: Payer: Medicare HMO | Admitting: Nurse Practitioner

## 2022-10-07 LAB — CBC WITH DIFFERENTIAL/PLATELET: MCV: 92.3 fL (ref 80.0–100.0)

## 2022-10-07 LAB — COMPREHENSIVE METABOLIC PANEL
ALT: 16 U/L (ref 6–29)
Potassium: 5 mmol/L (ref 3.5–5.3)

## 2022-10-08 ENCOUNTER — Other Ambulatory Visit (INDEPENDENT_AMBULATORY_CARE_PROVIDER_SITE_OTHER): Payer: Self-pay | Admitting: Gastroenterology

## 2022-10-08 DIAGNOSIS — R110 Nausea: Secondary | ICD-10-CM

## 2022-10-08 NOTE — Telephone Encounter (Signed)
Last seen 09/30/22

## 2022-10-09 ENCOUNTER — Encounter: Payer: Self-pay | Admitting: Nurse Practitioner

## 2022-10-09 ENCOUNTER — Ambulatory Visit: Payer: Medicare HMO | Attending: Nurse Practitioner | Admitting: Nurse Practitioner

## 2022-10-09 VITALS — BP 102/80 | HR 66 | Ht 64.0 in | Wt 153.4 lb

## 2022-10-09 DIAGNOSIS — Z950 Presence of cardiac pacemaker: Secondary | ICD-10-CM | POA: Diagnosis not present

## 2022-10-09 DIAGNOSIS — I4891 Unspecified atrial fibrillation: Secondary | ICD-10-CM

## 2022-10-09 DIAGNOSIS — R0609 Other forms of dyspnea: Secondary | ICD-10-CM

## 2022-10-09 DIAGNOSIS — I4892 Unspecified atrial flutter: Secondary | ICD-10-CM | POA: Diagnosis not present

## 2022-10-09 DIAGNOSIS — Z01810 Encounter for preprocedural cardiovascular examination: Secondary | ICD-10-CM | POA: Diagnosis not present

## 2022-10-09 DIAGNOSIS — F439 Reaction to severe stress, unspecified: Secondary | ICD-10-CM | POA: Diagnosis not present

## 2022-10-09 DIAGNOSIS — R251 Tremor, unspecified: Secondary | ICD-10-CM | POA: Diagnosis not present

## 2022-10-09 DIAGNOSIS — I059 Rheumatic mitral valve disease, unspecified: Secondary | ICD-10-CM

## 2022-10-09 DIAGNOSIS — I050 Rheumatic mitral stenosis: Secondary | ICD-10-CM | POA: Diagnosis not present

## 2022-10-09 DIAGNOSIS — I5020 Unspecified systolic (congestive) heart failure: Secondary | ICD-10-CM | POA: Diagnosis not present

## 2022-10-09 DIAGNOSIS — I340 Nonrheumatic mitral (valve) insufficiency: Secondary | ICD-10-CM

## 2022-10-09 NOTE — Progress Notes (Addendum)
Cardiology Office Note:  .   Date:  10/09/2022 ID:  Carol Richmond, DOB 1950-07-17, MRN [id number] PCP: Gabriel Earing, FNP  [facility] Providers Cardiologist:  Nona Dell, MD Cardiology APP:  Sharlene Dory, NP  Electrophysiologist:  Lewayne Bunting, MD    History of Present Illness: Carol Richmond is a very pleasant 72 y.o. female with a PMH of rheumatic MV disease, moderate to severe mitral valve stenosis, HFmrEF, AVNRT, s/p RF ablation, OSA, CHB, s/p PPM, HTN, carotid artery disease, T2DM, hx of TIA, NASH cirrhosis, G1 EV, GERD, who presents today for follow-up.   I saw patient on April 25, 2022. Was overall doing well from a cardiac perspective.   Admitted 07/2022 for A-fib/A-flutter with RVR. Was initiated on Amiodarone, transferred to [facility]. Underwent TEE/DCCV on 08/14/2022. TEE revealed EF 25-30%, severely reduced RV systolic function, severe mitral valve regurgitation with severe mitral stenosis - see full report below.   Last seen for follow-up on August 26, 2022. Was doing well at the time from a cardiac perspective. Denied any chest pain, shortness of breath, palpitations, syncope, presyncope, dizziness, orthopnea, PND, swelling or significant weight changes, acute bleeding, or claudication.   Prior to today's office visit, I was contacted by patient's GI MD (Dr. Levon Hedger) regarding that patient will be due for repeat colonoscopy later this year and will need to have an EGD for variceal surveillance in 2025. He has requested preoperative evaluation. He has also requested to see if carvedilol can be switched to metoprolol as he stated "this may prevent the progression of her varices, so we could avoid need to proceed with EGD next year."  Today she presents for follow-up. Only chief concern is dyspnea on exertion noted for the last couple of weeks, says it is not bothersome per her report. Denies any chest pain, palpitations, syncope, presyncope, dizziness,  orthopnea, PND, swelling or significant weight changes, acute bleeding, or claudication.  Tearful during interview at one point during conversation, admits to recent stress.  Studies Reviewed: [name]    TEE 08/14/2022:  1. Left ventricular ejection fraction, by estimation, is 25 to 30%. The  left ventricle has severely decreased function. The left ventricle  demonstrates global hypokinesis.   2. Right ventricular systolic function is severely reduced. The right  ventricular size is mildly enlarged.   3. Left atrial size was moderately dilated. No left atrial/left atrial  appendage thrombus was detected.   4. The mitral valve is rheumatic. Severe mitral valve regurgitation.  Severe mitral stenosis.   5. The aortic valve is tricuspid. Aortic valve regurgitation is trivial.  Risk Assessment/Calculations:    CHA2DS2-VASc Score = 7  This indicates a 11.2% annual risk of stroke. The patient's score is based upon: CHF History: 1 HTN History: 1 Diabetes History: 1 Stroke History: 2 Vascular Disease History: 0 Age Score: 1 Gender Score: 1   The 10-year ASCVD risk score (Arnett DK, et al., 2019) is: 15.9%   Values used to calculate the score:     Age: 55 years     Sex: Female     Is Non-Hispanic African American: No     Diabetic: Yes     Tobacco smoker: No     Systolic Blood Pressure: 102 mmHg     Is BP treated: Yes     HDL Cholesterol: 47 mg/dL     Total Cholesterol: 213 mg/dL      Physical Exam:   VS:  BP 102/80 (BP Location:  Right Arm, Patient Position: Sitting, Cuff Size: Normal)   Pulse 66   Ht 5\' 4"  (1.626 m)   Wt 153 lb 6.4 oz (69.6 kg)   SpO2 98%   BMI 26.33 kg/m    Wt Readings from Last 3 Encounters:  10/09/22 153 lb 6.4 oz (69.6 kg)  09/30/22 155 lb 9.6 oz (70.6 kg)  09/25/22 151 lb (68.5 kg)    GEN: Well nourished, well developed in no acute distress, appears nervous and tearful during interview today.  NECK: No JVD; No carotid bruits CARDIAC: S1/S2, RRR, Grade 1/6  murmur, no rubs, no gallops RESPIRATORY:  Clear to auscultation without rales, wheezing or rhonchi  ABDOMEN: Soft, non-tender, non-distended EXTREMITIES:  No edema; No deformity, mild tremors noted along bilateral forearms  ASSESSMENT AND PLAN: .    HFmrEF -> HFrEF, DOE Stage C, NYHA class I-II symptoms. Most likely in setting of A-fib/A-flutter with RVR. TEE/DCCV revealed EF 25-30% from 40-45%. Euvolemic and well compensated on exam, does admit to some DOE. Continue Entresto and Lopressor - cannot switch to Coreg at this time d/t current BP. Will update limited Echo in 6 weeks to evaluate EF.  At next visit, will consider starting SGLT2i if EF doesn't show improvement. Low sodium diet, fluid restriction <2L, and daily weights encouraged. Educated to contact our office for weight gain of 2 lbs overnight or 5 lbs in one week.   A-fib/A-flutter, s/p DCCV, AVNRT, s/p ablation Underwent previous successful DCCV. Denies any recent palpitations or tachycardia. Continue Lopressor and Amiodarone. Recent LFT and Thyroid panel results were WNL. Continue coumadin and continue to f/u with Coumadin Clinic. Heart healthy diet and regular cardiovascular exercise encouraged.   Rheumatic mitral valve disease, severe mitral stenosis with severe regurgitation Past TEE revealed mitral valve was not amenable to valvuloplasty due to severity of mitral insufficiency and due to severe/asymmetrical focal calcification of mid anterior leaflet.  Severe MR and mitral valve stenosis noted on TEE 07/2022, with mean gradient at 9.5 mmHg. Dr. Diona Browner previously recommended no further surgical management or invasive management of mitral valve at this time based on TEE results from 02/2022.  Continue current medication regimen.  ED precautions discussed. Discussed results with her during office visit today and discussed treatment options/plan of care. Pt is agreeable to Structural Heart Clinic Evaluation - will place referral.   CHB,  s/p PPM Doing well. Normal device function noted during last device check.  Continue follow-up with Dr. Ladona Ridgel.  5. Stress, tremors Admits to recent stressor. Denies any red flag signs/symptoms. Support given. Continue to follow with PCP, previously placed referral for new primary care provider.   6. Pre-operative cardiovascular risk assessment Ms. Remillard's perioperative risk of a major cardiac event is 0.9% according to the Revised Cardiac Risk Index (RCRI).  Therefore, she is at low risk for perioperative complications.   Her functional capacity is good at > 4 METs according to the Duke Activity Status Index (DASI). Recommendations: The patient requires an echocardiogram before a disposition can be made regarding surgical risk.               Would also recommend running this case past structural heart team for further evaluation prior to proceeding with EGD/colonoscopy. Antiplatelet and/or Anticoagulation Recommendations: Will route note to pharm D once she has had further evaluation from structural heart team and have them comment on Coumadin prior to procedure.   Dispo: Follow-up with me or APP in 8 weeks or sooner if anything changes.  Signed, Sharlene Dory, NP

## 2022-10-09 NOTE — Patient Instructions (Addendum)
Medication Instructions:  No changes in medications  Labwork: No lab work due   Testing/Procedures: Your physician has requested that you have an echocardiogram. Echocardiography is a painless test that uses sound waves to create images of your heart. It provides your doctor with information about the size and shape of your heart and how well your heart's chambers and valves are working. This procedure takes approximately one hour. There are no restrictions for this procedure. Please do NOT wear cologne, perfume, aftershave, or lotions (deodorant is allowed). Please arrive 15 minutes prior to your appointment time.   Follow-Up: Your physician recommends that you schedule a follow-up appointment in: 8 weeks with Sharlene Dory, NP  Any Other Special Instructions Will Be Listed Below (If Applicable).  [facility] Information  -[address]  [address], [place], [place] [address]  [phone]   If you need a refill on your cardiac medications before your next appointment, please call your pharmacy.
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
## 2022-10-11 ENCOUNTER — Other Ambulatory Visit: Payer: Medicare HMO | Admitting: *Deleted

## 2022-10-11 ENCOUNTER — Other Ambulatory Visit: Payer: Self-pay

## 2022-10-11 ENCOUNTER — Telehealth: Payer: Self-pay | Admitting: *Deleted

## 2022-10-11 ENCOUNTER — Encounter: Payer: Self-pay | Admitting: *Deleted

## 2022-10-11 DIAGNOSIS — I050 Rheumatic mitral stenosis: Secondary | ICD-10-CM

## 2022-10-11 DIAGNOSIS — I340 Nonrheumatic mitral (valve) insufficiency: Secondary | ICD-10-CM

## 2022-10-11 DIAGNOSIS — I059 Rheumatic mitral valve disease, unspecified: Secondary | ICD-10-CM

## 2022-10-11 NOTE — Patient Instructions (Signed)
Visit Information  Thank you for taking time to visit with me today. Please don't hesitate to contact me if I can be of assistance to you before our next scheduled telephone appointment.  Following are the goals we discussed today:   Goals Addressed             This Visit's Progress    CCM (DIABETES) EXPECTED OUTCOME:  MONITOR, SELF-MANAGE AND REDUCE SYMPTOMS OF DIABETES       Current Barriers:  Knowledge Deficits related to Diabetes management Chronic Disease Management support and education needs related to Diabetes and diet Patient reports she checks CBG every other day with fasting readings ranges continuing in low 100's with recent readins 107. Patient is exercising, does not always follow a special diet. Patient reports no new concerns today  Planned Interventions: Reviewed medications with patient and discussed importance of medication adherence;        Counseled on importance of regular laboratory monitoring as prescribed;        Discussed plans with patient for ongoing care management follow up and provided patient with direct contact information for care management team;      Advised patient, providing education and rationale, to check cbg once daily and record        call provider for findings outside established parameters;       Review of patient status, including review of consultants reports, relevant laboratory and other test results, and medications completed;       Reinforced carbohydrate modified diet and nutritious food choices Reviewed upcoming scheduled appointments  Symptom Management: Take medications as prescribed   Attend all scheduled provider appointments Call pharmacy for medication refills 3-7 days in advance of running out of medications Attend church or other social activities Perform all self care activities independently  Perform IADL's (shopping, preparing meals, housekeeping, managing finances) independently Call provider office for new concerns  or questions  check blood sugar at prescribed times: once daily check feet daily for cuts, sores or redness enter blood sugar readings and medication or insulin into daily log take the blood sugar log to all doctor visits take the blood sugar meter to all doctor visits trim toenails straight across drink 6 to 8 glasses of water each day eat fish at least once per week fill half of plate with vegetables limit fast food meals to no more than 1 per week manage portion size prepare main meal at home 3 to 5 days each week read food labels for fat, fiber, carbohydrates and portion size set a realistic goal Be mindful of carbohydrate intake at each meal (too much bread, pasta, rice, potatoes at one meal with elevate your blood sugar) Referral for psychology sent to [facility]- [phone] if you decide to follow up  Follow Up Plan: Telephone follow up appointment with care management team member scheduled for:  12/20/22 at 3 pm       CCM (HYPERTENSION) EXPECTED OUTCOME: MONITOR, SELF-MANAGE AND REDUCE SYMPTOMS OF HYPERTENSION       Current Barriers:  Knowledge Deficits related to Hypertension management Chronic Disease Management support and education needs related to Hypertension, diet Patient reports she is widowed and lives alone, is independent with all aspects of her care, continues to drive, goes to the gym approximately 5 days per week, has adult daughter she can call on if needed, does not monitor blood pressure, is considering obtaining blood pressure cuff from [facility] and may start checking BP.  Pt reports she does not always follow  a special diet. Patient reports she has all medication and taking as prescribed, no new concerns reported Patient reports she is managing A-Fib well and following up with cardiologist  Planned Interventions: Evaluation of current treatment plan related to hypertension self management and patient's adherence to plan as established by provider;   Reviewed  medications with patient and discussed importance of compliance;  Counseled on the importance of exercise goals with target of 150 minutes per week Discussed plans with patient for ongoing care management follow up and provided patient with direct contact information for care management team; Advised patient, providing education and rationale, to monitor blood pressure daily and record, calling PCP for findings outside established parameters;  Advised patient to discuss any issues with blood pressure, medications with provider; Discussed complications of poorly controlled blood pressure such as heart disease, stroke, circulatory complications, vision complications, kidney impairment, sexual dysfunction;  Reinforced low sodium diet and importance of reading food labels Reviewed A-Fib action plan  Symptom Management: Take medications as prescribed   Attend all scheduled provider appointments Call pharmacy for medication refills 3-7 days in advance of running out of medications Attend church or other social activities Perform all self care activities independently  Perform IADL's (shopping, preparing meals, housekeeping, managing finances) independently Call provider office for new concerns or questions  check blood pressure weekly choose a place to take my blood pressure (home, clinic or office, retail store) write blood pressure results in a log or diary take blood pressure log to all doctor appointments keep all doctor appointments take medications for blood pressure exactly as prescribed begin an exercise program report new symptoms to your doctor eat more whole grains, fruits and vegetables, lean meats and healthy fats Continue working out at the gym- keep up the good work! Follow low sodium diet- read food labels for sodium content Limit fast food Follow A-fib action plan, call your doctor early on for change in health status/ symptoms  Follow Up Plan: Telephone follow up  appointment with care management team member scheduled for:  12/20/22 at 3 pm           Our next appointment is by telephone on 12/20/22 at 3 pm  Please call the care guide team at [phone] if you need to cancel or reschedule your appointment.   If you are experiencing a Mental Health or Behavioral Health Crisis or need someone to talk to, please call the Suicide and Crisis Lifeline: 988 call the [place] National Suicide Prevention Lifeline: 838-370-5441 or TTY: 567 571 9337 TTY 660-543-1968) to talk to a trained counselor call 1-800-273-TALK (toll free, 24 hour hotline) go to [facility] [address], [place] [phone]) call the [facility] Crisis Line: [phone] call 911   Patient verbalizes understanding of instructions and care plan provided today and agrees to view in MyChart. Active MyChart status and patient understanding of how to access instructions and care plan via MyChart confirmed with patient.     Telephone follow up appointment with care management team member scheduled for: 12/20/22 at 3 pm  Atrial Fibrillation Atrial fibrillation (AFib) is a type of heartbeat that is irregular or fast. If you have AFib, your heart beats without any order. This makes it hard for your heart to pump blood in a normal way. AFib may come and go, or it may become a long-lasting problem. If AFib is not treated, it can put you at higher risk for stroke, heart failure, and other heart problems. What are the  causes? AFib may be caused by diseases that damage the heart's electrical system. They include: High blood pressure. Heart failure. Heart valve diseases. Heart surgery. Diabetes. Thyroid disease. Kidney disease. Lung diseases, such as pneumonia or COPD. Sleep apnea. Sometimes the cause is not known. What increases the risk? You are more likely to develop AFib if: You are older. You exercise often and very hard. You have a  family history of AFib. You are female. You are Caucasian. You are overweight. You smoke. You drink a lot of alcohol. What are the signs or symptoms? Common symptoms of this condition include: A feeling that your heart is beating very fast. Chest pain or discomfort. Feeling short of breath. Suddenly feeling light-headed or weak. Getting tired easily during activity. Fainting. Sweating. In some cases, there are no symptoms. How is this treated? Medicines to: Prevent blood clots. Treat heart rate or heart rhythm problems. Using devices, such as a pacemaker, to correct heart rhythm problems. Doing surgery to remove the part of the heart that sends bad signals. Closing an area where clots can form in the heart (left atrial appendage). In some cases, your doctor will treat other underlying conditions. Follow these instructions at home: Medicines Take over-the-counter and prescription medicines only as told by your doctor. Do not take any new medicines without first talking to your doctor. If you are taking blood thinners: Talk with your doctor before taking aspirin or NSAIDs, such as ibuprofen. Take your medicines as told. Take them at the same time each day. Do not do things that could hurt or bruise you. Be careful to avoid falls. Wear an alert bracelet or carry a card that says you take blood thinners. Lifestyle Do not smoke or use any products that contain nicotine or tobacco. If you need help quitting, ask your doctor. Eat heart-healthy foods. Talk with your doctor about the right eating plan for you. Exercise regularly as told by your doctor. Do not drink alcohol. Lose weight if you are overweight. General instructions If you have sleep apnea, treat it as told by your doctor. Do not use diet pills unless your doctor says they are safe for you. Diet pills may make heart problems worse. Keep all follow-up visits. Your doctor will check your heart rate and rhythm  regularly. Contact a doctor if: You notice a change in the speed, rhythm, or strength of your heartbeat. You are taking a blood-thinning medicine and you get more bruising. You get tired more easily when you move or exercise. You have a sudden change in weight. Get help right away if:  You have pain in your chest. You have trouble breathing. You have side effects of blood thinners, such as blood in your vomit, poop (stool), or pee (urine), or bleeding that cannot stop. You have any signs of a stroke. "BE FAST" is an easy way to remember the main warning signs: B - Balance. Dizziness, sudden trouble walking, or loss of balance. E - Eyes. Trouble seeing or a change in how you see. F - Face. Sudden weakness or loss of feeling in the face. The face or eyelid may droop on one side. A - Arms.Weakness or loss of feeling in an arm. This happens suddenly and usually on one side of the body. S - Speech. Sudden trouble speaking, slurred speech, or trouble understanding what people say. T - Time.Time to call emergency services. Write down what time symptoms started. You have other signs of a stroke, such as: A sudden, very  bad headache with no known cause. Feeling like you may vomit (nausea). Vomiting. A seizure. These symptoms may be an emergency. Get help right away. Call 911. Do not wait to see if the symptoms will go away. Do not drive yourself to the hospital. This information is not intended to replace advice given to you by your health care provider. Make sure you discuss any questions you have with your health care provider. Document Revised: 10/24/2021 Document Reviewed: 10/24/2021 Elsevier Patient Education  2024 Elsevier Inc.   Irving Shows [facility], BSN [facility]/ Ambulatory Care Management [phone]

## 2022-10-11 NOTE — Telephone Encounter (Signed)
Jonelle Sidle, MD  Iona Coach, RN; Tonny Bollman, MD; Sharlene Dory, NP Cc: Eustace Moore, RN Lauren, let me regroup with Lanora Manis on this.  I have not seen Ms. Ferring since December 2023 and clearly her status has changed since that time including severity of her mitral valve disease.  Probably best for me to see her in the office and talk things over so she fully understands the situation.  I will forward this to my [place] nurse to get an office visit set up.

## 2022-10-11 NOTE — Telephone Encounter (Signed)
Appointment scheduled to see Diona Browner on 10/31/2022 @11 :00 am at the [place] office.  Left message for patient to call office.

## 2022-10-11 NOTE — Patient Outreach (Signed)
Care Management   Visit Note  10/11/2022 Name: Carol Richmond MRN: [id number] DOB: May 21, 1950  Subjective: Carol Richmond is a 72 y.o. year old female who is a primary care patient of Gabriel Earing, FNP. The Care Management team was consulted for assistance.      Engaged with patient spoke with patient by telephone for follow up   Goals Addressed             This Visit's Progress    CCM (DIABETES) EXPECTED OUTCOME:  MONITOR, SELF-MANAGE AND REDUCE SYMPTOMS OF DIABETES       Current Barriers:  Knowledge Deficits related to Diabetes management Chronic Disease Management support and education needs related to Diabetes and diet Patient reports she checks CBG every other day with fasting readings ranges continuing in low 100's with recent readins 107. Patient is exercising, does not always follow a special diet. Patient reports no new concerns today  Planned Interventions: Reviewed medications with patient and discussed importance of medication adherence;        Counseled on importance of regular laboratory monitoring as prescribed;        Discussed plans with patient for ongoing care management follow up and provided patient with direct contact information for care management team;      Advised patient, providing education and rationale, to check cbg once daily and record        call provider for findings outside established parameters;       Review of patient status, including review of consultants reports, relevant laboratory and other test results, and medications completed;       Reinforced carbohydrate modified diet and nutritious food choices Reviewed upcoming scheduled appointments  Symptom Management: Take medications as prescribed   Attend all scheduled provider appointments Call pharmacy for medication refills 3-7 days in advance of running out of medications Attend church or other social activities Perform all self care activities independently  Perform  IADL's (shopping, preparing meals, housekeeping, managing finances) independently Call provider office for new concerns or questions  check blood sugar at prescribed times: once daily check feet daily for cuts, sores or redness enter blood sugar readings and medication or insulin into daily log take the blood sugar log to all doctor visits take the blood sugar meter to all doctor visits trim toenails straight across drink 6 to 8 glasses of water each day eat fish at least once per week fill half of plate with vegetables limit fast food meals to no more than 1 per week manage portion size prepare main meal at home 3 to 5 days each week read food labels for fat, fiber, carbohydrates and portion size set a realistic goal Be mindful of carbohydrate intake at each meal (too much bread, pasta, rice, potatoes at one meal with elevate your blood sugar) Referral for psychology sent to [facility]- [phone] if you decide to follow up  Follow Up Plan: Telephone follow up appointment with care management team member scheduled for:  12/20/22 at 3 pm       CCM (HYPERTENSION) EXPECTED OUTCOME: MONITOR, SELF-MANAGE AND REDUCE SYMPTOMS OF HYPERTENSION       Current Barriers:  Knowledge Deficits related to Hypertension management Chronic Disease Management support and education needs related to Hypertension, diet Patient reports she is widowed and lives alone, is independent with all aspects of her care, continues to drive, goes to the gym approximately 5 days per week, has adult daughter she can call on if needed, does not monitor blood  pressure, is considering obtaining blood pressure cuff from [facility] and may start checking BP.  Pt reports she does not always follow a special diet. Patient reports she has all medication and taking as prescribed, no new concerns reported Patient reports she is managing A-Fib well and following up with cardiologist  Planned Interventions: Evaluation of current treatment  plan related to hypertension self management and patient's adherence to plan as established by provider;   Reviewed medications with patient and discussed importance of compliance;  Counseled on the importance of exercise goals with target of 150 minutes per week Discussed plans with patient for ongoing care management follow up and provided patient with direct contact information for care management team; Advised patient, providing education and rationale, to monitor blood pressure daily and record, calling PCP for findings outside established parameters;  Advised patient to discuss any issues with blood pressure, medications with provider; Discussed complications of poorly controlled blood pressure such as heart disease, stroke, circulatory complications, vision complications, kidney impairment, sexual dysfunction;  Reinforced low sodium diet and importance of reading food labels Reviewed A-Fib action plan  Symptom Management: Take medications as prescribed   Attend all scheduled provider appointments Call pharmacy for medication refills 3-7 days in advance of running out of medications Attend church or other social activities Perform all self care activities independently  Perform IADL's (shopping, preparing meals, housekeeping, managing finances) independently Call provider office for new concerns or questions  check blood pressure weekly choose a place to take my blood pressure (home, clinic or office, retail store) write blood pressure results in a log or diary take blood pressure log to all doctor appointments keep all doctor appointments take medications for blood pressure exactly as prescribed begin an exercise program report new symptoms to your doctor eat more whole grains, fruits and vegetables, lean meats and healthy fats Continue working out at the gym- keep up the good work! Follow low sodium diet- read food labels for sodium content Limit fast food Follow A-fib action  plan, call your doctor early on for change in health status/ symptoms  Follow Up Plan: Telephone follow up appointment with care management team member scheduled for:  12/20/22 at 3 pm          Plan: Telephone follow up appointment with care management team member scheduled for: 12/20/22 at 3 pm  Irving Shows [facility], BSN [place]/ Ambulatory Care Management [phone]

## 2022-10-14 ENCOUNTER — Ambulatory Visit: Payer: Medicare HMO | Admitting: Physician Assistant

## 2022-10-14 ENCOUNTER — Ambulatory Visit (INDEPENDENT_AMBULATORY_CARE_PROVIDER_SITE_OTHER)
Admission: RE | Admit: 2022-10-14 | Discharge: 2022-10-14 | Disposition: A | Discharge status: Home / Self Care | Payer: Medicare HMO | Source: Ambulatory Visit | Attending: Physician Assistant | Admitting: Physician Assistant

## 2022-10-14 ENCOUNTER — Ambulatory Visit (HOSPITAL_COMMUNITY): Admission: RE | Admit: 2022-10-14 | Payer: Medicare HMO | Source: Ambulatory Visit

## 2022-10-14 VITALS — BP 95/56 | HR 72 | Temp 98.2°F | Resp 16 | Ht 64.0 in | Wt 154.0 lb

## 2022-10-14 DIAGNOSIS — B9620 Unspecified Escherichia coli [E. coli] as the cause of diseases classified elsewhere: Secondary | ICD-10-CM | POA: Diagnosis present

## 2022-10-14 DIAGNOSIS — Z90710 Acquired absence of both cervix and uterus: Secondary | ICD-10-CM | POA: Diagnosis not present

## 2022-10-14 DIAGNOSIS — I517 Cardiomegaly: Secondary | ICD-10-CM | POA: Diagnosis not present

## 2022-10-14 DIAGNOSIS — K7460 Unspecified cirrhosis of liver: Secondary | ICD-10-CM | POA: Insufficient documentation

## 2022-10-14 DIAGNOSIS — R0989 Other specified symptoms and signs involving the circulatory and respiratory systems: Secondary | ICD-10-CM | POA: Diagnosis not present

## 2022-10-14 DIAGNOSIS — Y92009 Unspecified place in unspecified non-institutional (private) residence as the place of occurrence of the external cause: Secondary | ICD-10-CM | POA: Diagnosis not present

## 2022-10-14 DIAGNOSIS — I4891 Unspecified atrial fibrillation: Secondary | ICD-10-CM | POA: Diagnosis not present

## 2022-10-14 DIAGNOSIS — G9341 Metabolic encephalopathy: Secondary | ICD-10-CM | POA: Diagnosis not present

## 2022-10-14 DIAGNOSIS — Z7901 Long term (current) use of anticoagulants: Secondary | ICD-10-CM | POA: Diagnosis not present

## 2022-10-14 DIAGNOSIS — N179 Acute kidney failure, unspecified: Secondary | ICD-10-CM | POA: Diagnosis not present

## 2022-10-14 DIAGNOSIS — D72829 Elevated white blood cell count, unspecified: Secondary | ICD-10-CM | POA: Diagnosis present

## 2022-10-14 DIAGNOSIS — I959 Hypotension, unspecified: Secondary | ICD-10-CM | POA: Diagnosis not present

## 2022-10-14 DIAGNOSIS — E876 Hypokalemia: Secondary | ICD-10-CM | POA: Diagnosis present

## 2022-10-14 DIAGNOSIS — K7581 Nonalcoholic steatohepatitis (NASH): Secondary | ICD-10-CM | POA: Diagnosis not present

## 2022-10-14 DIAGNOSIS — I6523 Occlusion and stenosis of bilateral carotid arteries: Secondary | ICD-10-CM | POA: Insufficient documentation

## 2022-10-14 DIAGNOSIS — L89152 Pressure ulcer of sacral region, stage 2: Secondary | ICD-10-CM | POA: Diagnosis not present

## 2022-10-14 DIAGNOSIS — R4182 Altered mental status, unspecified: Secondary | ICD-10-CM | POA: Diagnosis not present

## 2022-10-14 DIAGNOSIS — I10 Essential (primary) hypertension: Secondary | ICD-10-CM | POA: Diagnosis not present

## 2022-10-14 DIAGNOSIS — S0990XA Unspecified injury of head, initial encounter: Secondary | ICD-10-CM | POA: Diagnosis not present

## 2022-10-14 DIAGNOSIS — Z96651 Presence of right artificial knee joint: Secondary | ICD-10-CM | POA: Diagnosis present

## 2022-10-14 DIAGNOSIS — R791 Abnormal coagulation profile: Secondary | ICD-10-CM | POA: Diagnosis present

## 2022-10-14 DIAGNOSIS — E1165 Type 2 diabetes mellitus with hyperglycemia: Secondary | ICD-10-CM | POA: Diagnosis not present

## 2022-10-14 DIAGNOSIS — W07XXXA Fall from chair, initial encounter: Secondary | ICD-10-CM | POA: Diagnosis present

## 2022-10-14 DIAGNOSIS — Z8673 Personal history of transient ischemic attack (TIA), and cerebral infarction without residual deficits: Secondary | ICD-10-CM | POA: Diagnosis not present

## 2022-10-14 DIAGNOSIS — I442 Atrioventricular block, complete: Secondary | ICD-10-CM | POA: Diagnosis not present

## 2022-10-14 DIAGNOSIS — Z8249 Family history of ischemic heart disease and other diseases of the circulatory system: Secondary | ICD-10-CM | POA: Diagnosis not present

## 2022-10-14 DIAGNOSIS — Z950 Presence of cardiac pacemaker: Secondary | ICD-10-CM | POA: Diagnosis not present

## 2022-10-14 DIAGNOSIS — N390 Urinary tract infection, site not specified: Secondary | ICD-10-CM | POA: Diagnosis not present

## 2022-10-14 DIAGNOSIS — R7881 Bacteremia: Secondary | ICD-10-CM | POA: Diagnosis not present

## 2022-10-14 DIAGNOSIS — K7689 Other specified diseases of liver: Secondary | ICD-10-CM | POA: Diagnosis not present

## 2022-10-14 DIAGNOSIS — N3000 Acute cystitis without hematuria: Secondary | ICD-10-CM | POA: Diagnosis not present

## 2022-10-14 DIAGNOSIS — K219 Gastro-esophageal reflux disease without esophagitis: Secondary | ICD-10-CM | POA: Diagnosis present

## 2022-10-14 DIAGNOSIS — Z9049 Acquired absence of other specified parts of digestive tract: Secondary | ICD-10-CM | POA: Diagnosis not present

## 2022-10-14 DIAGNOSIS — I509 Heart failure, unspecified: Secondary | ICD-10-CM | POA: Diagnosis not present

## 2022-10-14 NOTE — Telephone Encounter (Signed)
Pt daughter returning call 

## 2022-10-14 NOTE — Telephone Encounter (Signed)
Patient's daughter informed and verbalized understanding of plan. 

## 2022-10-14 NOTE — Progress Notes (Signed)
Office Note     CC:  follow up Requesting Provider:  Gabriel Earing, FNP  HPI: Carol Richmond is a 72 y.o. (10-Jun-1950) female who presents for surveillance of carotid artery stenosis.  She had a right carotid endarterectomy by Dr. Myra Gianotti in 2019 due to symptomatic carotid stenosis with TIA presenting as left arm and left leg weakness.  She denies any neurological events or diagnosis of CVA or TIA since last office visit.  She also denies any current strokelike symptoms including slurring speech, changes in vision, or one-sided weakness.  She is taking a daily aspirin and statin.  She denies tobacco use.   Past Medical History:  Diagnosis Date   Asthma    AVNRT (AV nodal re-entry tachycardia)    Carotid artery disease (HCC)    Cirrhosis (HCC)    Diverticulosis of colon (without mention of hemorrhage)    Essential hypertension    Functional ovarian cysts    GERD (gastroesophageal reflux disease)    Hemorrhoids, external, without mention of complication    Hormone replacement therapy (postmenopausal)    IBS (irritable bowel syndrome)    Migraine    Mitral valve disease    Pacemaker    Complete heart block - [employer]   Right knee DJD    Seizures (HCC)    Sleep apnea    TIA (transient ischemic attack)    Type 2 diabetes mellitus (HCC)     Past Surgical History:  Procedure Laterality Date   BIOPSY  09/23/2018   Procedure: BIOPSY;  Surgeon: Malissa Hippo, MD;  Location: [facility];  Service: Endoscopy;;  duodeneum and stomach   CARDIOVERSION N/A 08/14/2022   Procedure: CARDIOVERSION;  Surgeon: [name], MD;  Location: [facility] INVASIVE CV LAB;  Service: Cardiovascular;  Laterality: N/A;   CHOLECYSTECTOMY  1980   ENDARTERECTOMY Right 07/01/2017   Procedure: ENDARTERECTOMY CAROTID RIGHT;  Surgeon: Nada Libman, MD;  Location: [facility] OR;  Service: Vascular;  Laterality: Right;   ESOPHAGOGASTRODUODENOSCOPY N/A 09/23/2018   Procedure: ESOPHAGOGASTRODUODENOSCOPY (EGD);   Surgeon: Malissa Hippo, MD;  Location: [facility];  Service: Endoscopy;  Laterality: N/A;  2:45-moved to 1:45 per Dewayne Hatch   ESOPHAGOGASTRODUODENOSCOPY (EGD) WITH PROPOFOL N/A 03/07/2021   Procedure: ESOPHAGOGASTRODUODENOSCOPY (EGD) WITH PROPOFOL;  Surgeon: Malissa Hippo, MD;  Location: [facility];  Service: Endoscopy;  Laterality: N/A;  12:50   LEFT HEART CATHETERIZATION WITH CORONARY ANGIOGRAM N/A 11/18/2013   Procedure: LEFT HEART CATHETERIZATION WITH CORONARY ANGIOGRAM;  Surgeon: Kathleene Hazel, MD;  Location: [facility] CATH LAB;  Service: Cardiovascular;  Laterality: N/A;   OOPHORECTOMY     2012 Dr. Donzetta Matters in [place]   PACEMAKER IMPLANT N/A 12/14/2021   Procedure: PACEMAKER IMPLANT;  Surgeon: Marinus Maw, MD;  Location: [facility] INVASIVE CV LAB;  Service: Cardiovascular;  Laterality: N/A;   PATCH ANGIOPLASTY Right 07/01/2017   Procedure: RIGHT CAROTID PATCH ANGIOPLASTY;  Surgeon: Nada Libman, MD;  Location: [facility] OR;  Service: Vascular;  Laterality: Right;   SVT ABLATION N/A 12/14/2021   Procedure: SVT ABLATION;  Surgeon: Marinus Maw, MD;  Location: [facility] INVAS[facility] CV LAB;  Service: Cardiovascular;  Laterality: N/A;   TEE WITHOUT CARDIOVERSION N/A 03/01/2022   Procedure: TRANSESOPHAGEAL ECHOCARDIOGRAM (TEE);  Surgeon: Thurmon Fair, MD;  Location: [facility] ENDOSCOPY;  Service: Cardiovascular;  Laterality: N/A;   TEE WITHOUT CARDIOVERSION N/A 08/14/2022   Procedure: TRANSESOPHAGEAL ECHOCARDIOGRAM;  Surgeon: [name], MD;  Location: [facility] INVASIVE CV LAB;  Service: Cardiovascular;  Laterality: N/A;   TONSILLECTOMY     TOTAL KNEE ARTHROPLASTY Right 09/02/2012   Procedure: TOTAL KNEE ARTHROPLASTY- right;  Surgeon: Loreta Ave, MD;  Location: [facility] OR;  Service: Orthopedics;  Laterality: Right;   TUBAL LIGATION     TYMPANOMASTOIDECTOMY Left 04/02/2016   Procedure: LEFT CANAL WALL DOWN TYMPANOMASTOIDECTOMY;  Surgeon: Newman Pies, MD;  Location: [facility];  Service: ENT;  Laterality:  Left;   VAGINAL HYSTERECTOMY  1990   WOUND EXPLORATION Right 07/02/2017   Procedure: RIGHT NECK EXPLORATION WITH INTRAOPERATIVE ULTRASOUND;  Surgeon: Chuck Hint, MD;  Location: [facility] OR;  Service: Vascular;  Laterality: Right;    Social History   Socioeconomic History   Marital status: Widowed    Spouse name: Not on file   Number of children: 3   Years of education: Not on file   Highest education level: Not on file  Occupational History   Occupation: retired  Tobacco Use   Smoking status: Never   Smokeless tobacco: Never  Vaping Use   Vaping status: Never Used  Substance and Sexual Activity   Alcohol use: No    Alcohol/week: 0.0 standard drinks of alcohol   Drug use: No   Sexual activity: Not Currently  Other Topics Concern   Not on file  Social History Narrative   Lives alone. Granddaughter lives across the street, her 3 children all live nearby   Social Determinants of Health   Financial Resource Strain: Low Risk  (09/25/2022)   Overall Financial Resource Strain (CARDIA)    Difficulty of Paying Living Expenses: Not hard at all  Food Insecurity: No Food Insecurity (09/25/2022)   Hunger Vital Sign    Worried About Running Out of Food in the Last Year: Never true    Ran Out of Food in the Last Year: Never true  Transportation Needs: No Transportation Needs (09/25/2022)   PRAPARE - Administrator, Civil Service (Medical): No    Lack of Transportation (Non-Medical): No  Physical Activity: Insufficiently Active (09/25/2022)   Exercise Vital Sign    Days of Exercise per Week: 2 days    Minutes of Exercise per Session: 30 min  Stress: No Stress Concern Present (09/25/2022)   [employer] of Occupational Health - Occupational Stress Questionnaire    Feeling of Stress : Not at all  Social Connections: Socially Integrated (09/25/2022)   Social Connection and Isolation Panel [NHANES]    Frequency of Communication with Friends and Family: More than three times a  week    Frequency of Social Gatherings with Friends and Family: More than three times a week    Attends Religious Services: More than 4 times per year    Active Member of [employer] or Organizations: Yes    Attends Engineer, structural: More than 4 times per year    Marital Status: Married  Catering manager Violence: Not At Risk (09/25/2022)   Humiliation, Afraid, Rape, and Kick questionnaire    Fear of Current or Ex-Partner: No    Emotionally Abused: No    Physically Abused: No    Sexually Abused: No    Family History  Problem Relation Age of Onset   Heart disease Mother    Diabetes Mother    Heart disease Brother    Cancer Sister    Heart disease Brother    Heart disease Brother    Heart disease Son     Current Outpatient Medications  Medication Sig Dispense Refill  Accu-Chek Softclix Lancets lancets check blood sugars twice daily DX E11.65 200 each 3   acetaminophen (TYLENOL) 500 MG tablet Take 500 mg by mouth daily as needed for mild pain.     amiodarone (PACERONE) 200 MG tablet Take 2 tablets (400 mg total) by mouth 2 (two) times daily for 9 days, THEN 1 tablet (200 mg total) daily. 126 tablet 0   Blood Pressure Monitoring (OMRON 3 SERIES BP MONITOR) DEVI Use as directed 1 each 0   cetirizine (ZYRTEC ALLERGY) 10 MG tablet Take 1 tablet (10 mg total) by mouth daily. 90 tablet 1   citalopram (CELEXA) 10 MG tablet Take 1 tablet (10 mg total) by mouth daily. 30 tablet 2   fluticasone (FLONASE) 50 MCG/ACT nasal spray Place 2 sprays into both nostrils daily. (Patient taking differently: Place 2 sprays into both nostrils daily as needed for allergies.) 16 g 6   furosemide (LASIX) 40 MG tablet Take 40 mg by mouth daily.     gabapentin (NEURONTIN) 300 MG capsule Take 300 mg by mouth 3 (three) times daily as needed (neuropathy).     glucose blood (ACCU-CHEK GUIDE) test strip USE AS INSTRUCTED TO TEST BLOOD SUGAR DAILY AS DIRECTED. 50 each 11   lactulose (CHRONULAC) 10 GM/15ML  solution Take 30 mLs (20 g total) by mouth 3 (three) times daily. 16200 mL 3   lamoTRIgine (LAMICTAL) 200 MG tablet TAKE (1) TABLET TWICE DAILY. (Patient taking differently: Take 200 mg by mouth 2 (two) times daily.) 180 tablet 3   LORazepam (ATIVAN) 0.5 MG tablet Take 1 tablet (0.5 mg total) by mouth 2 (two) times daily as needed for anxiety. 60 tablet 5   magic mouthwash (lidocaine, diphenhydrAMINE, alum & mag hydroxide) suspension Swish and swallow 5 mLs 3 (three) times daily as needed for mouth pain. 360 mL 3   metFORMIN (GLUCOPHAGE) 500 MG tablet Take 1 tablet (500 mg total) by mouth 2 (two) times daily with a meal. 120 tablet 3   metoprolol tartrate (LOPRESSOR) 25 MG tablet Take 0.5 tablets (12.5 mg total) by mouth 2 (two) times daily. 30 tablet 3   montelukast (SINGULAIR) 10 MG tablet Take 1 tablet (10 mg total) by mouth at bedtime. 30 tablet 3   Multiple Vitamins-Minerals (MULTIVITAMIN WOMEN 50+) TABS Take 1 tablet by mouth daily.     ondansetron (ZOFRAN) 4 MG tablet take 1 tablet by mouth every 8 hours as needed for nausea and vomiting. 60 tablet 0   pantoprazole (PROTONIX) 40 MG tablet TAKE (1) TABLET TWICE DAILY. (Patient taking differently: Take 40 mg by mouth 2 (two) times daily.) 60 tablet 5   potassium chloride SA (KLOR-CON M) 20 MEQ tablet Take 20 mEq by mouth 2 (two) times daily.     sacubitril-valsartan (ENTRESTO) 24-26 MG Take 1 tablet by mouth 2 (two) times daily. 180 tablet 3   traMADol (ULTRAM) 50 MG tablet Take 50 mg by mouth every 6 (six) hours as needed.     traZODone (DESYREL) 50 MG tablet Take 1 tablet (50 mg total) by mouth at bedtime as needed for sleep. 90 tablet 3   warfarin (COUMADIN) 5 MG tablet Take 1 tablet (5 mg total) by mouth daily. Or as directed by [facility]. 45 tablet 1   Azelastine HCl 0.15 % SOLN  (Patient not taking: Reported on 10/14/2022)     No current facility-administered medications for this visit.    No Known Allergies   REVIEW OF  SYSTEMS:   [X]  denotes positive  finding, [ ]  denotes negative finding Cardiac  Comments:  Chest pain or chest pressure:    Shortness of breath upon exertion:    Short of breath when lying flat:    Irregular heart rhythm:        Vascular    Pain in calf, thigh, or hip brought on by ambulation:    Pain in feet at night that wakes you up from your sleep:     Blood clot in your veins:    Leg swelling:         Pulmonary    Oxygen at home:    Productive cough:     Wheezing:         Neurologic    Sudden weakness in arms or legs:     Sudden numbness in arms or legs:     Sudden onset of difficulty speaking or slurred speech:    Temporary loss of vision in one eye:     Problems with dizziness:         Gastrointestinal    Blood in stool:     Vomited blood:         Genitourinary    Burning when urinating:     Blood in urine:        Psychiatric    Major depression:         Hematologic    Bleeding problems:    Problems with blood clotting too easily:        Skin    Rashes or ulcers:        Constitutional    Fever or chills:      PHYSICAL EXAMINATION:  Vitals:   10/14/22 1234 10/14/22 1236  BP: (!) 95/59 (!) 95/56  Pulse: 72   Resp: 16   Temp: 98.2 F (36.8 C)   TempSrc: Temporal   SpO2: 93%   Weight: 154 lb (69.9 kg)   Height: 5\' 4"  (1.626 m)     General:  WDWN in NAD; vital signs documented above Gait: Not observed HENT: WNL, normocephalic Pulmonary: normal non-labored breathing , without Rales, rhonchi,  wheezing Cardiac: regular HR Abdomen: soft, NT, no masses Skin: without rashes Vascular Exam/Pulses: 1+ R radial pulse; 2+ L radial pulse Extremities: without ischemic changes, without Gangrene , without cellulitis; without open wounds;  Musculoskeletal: no muscle wasting or atrophy  Neurologic: A&O X 3; CN grossly intact Psychiatric:  The pt has Normal affect.   Non-Invasive Vascular Imaging:   Right carotid endarterectomy site widely patent; no  evidence of recurrent stenosis  Left ICA 1 to 39% stenosis    ASSESSMENT/PLAN:: 72 y.o. female here for follow up for surveillance of carotid artery stenosis  -Subjectively the patient has not experienced any strokelike symptoms since last office visit nor has she been diagnosed with any TIAs or CVA.  Carotid duplex is unchanged demonstrating a widely patent endarterectomy site without any recurrent stenosis on the right.  She also has 1 to 39% stenosis of the left ICA.  She will continue her aspirin and statin daily.  She will follow-up in 1 year with a repeat carotid duplex.    Emilie Rutter, PA-C [facility] [phone]  Clinic MD:   Myra Gianotti

## 2022-10-15 ENCOUNTER — Ambulatory Visit: Payer: Medicare HMO | Admitting: Nurse Practitioner

## 2022-10-16 ENCOUNTER — Other Ambulatory Visit: Payer: Self-pay

## 2022-10-16 ENCOUNTER — Emergency Department (HOSPITAL_COMMUNITY): Payer: Medicare HMO

## 2022-10-16 ENCOUNTER — Ambulatory Visit: Payer: Medicare HMO | Admitting: Family Medicine

## 2022-10-16 ENCOUNTER — Encounter: Payer: Self-pay | Admitting: Family Medicine

## 2022-10-16 ENCOUNTER — Ambulatory Visit: Payer: Medicare HMO | Admitting: Cardiology

## 2022-10-16 ENCOUNTER — Inpatient Hospital Stay (HOSPITAL_COMMUNITY)
Admission: EM | Admit: 2022-10-16 | Discharge: 2022-10-21 | DRG: 682 | Disposition: A | Discharge status: Home Health | Payer: Medicare HMO | Attending: Family Medicine | Admitting: Family Medicine

## 2022-10-16 DIAGNOSIS — I442 Atrioventricular block, complete: Secondary | ICD-10-CM | POA: Diagnosis present

## 2022-10-16 DIAGNOSIS — I517 Cardiomegaly: Secondary | ICD-10-CM | POA: Diagnosis not present

## 2022-10-16 DIAGNOSIS — E876 Hypokalemia: Secondary | ICD-10-CM | POA: Diagnosis present

## 2022-10-16 DIAGNOSIS — L89152 Pressure ulcer of sacral region, stage 2: Secondary | ICD-10-CM | POA: Diagnosis present

## 2022-10-16 DIAGNOSIS — I4891 Unspecified atrial fibrillation: Secondary | ICD-10-CM | POA: Diagnosis present

## 2022-10-16 DIAGNOSIS — Z7901 Long term (current) use of anticoagulants: Secondary | ICD-10-CM

## 2022-10-16 DIAGNOSIS — K7581 Nonalcoholic steatohepatitis (NASH): Secondary | ICD-10-CM | POA: Diagnosis present

## 2022-10-16 DIAGNOSIS — N3000 Acute cystitis without hematuria: Secondary | ICD-10-CM | POA: Diagnosis present

## 2022-10-16 DIAGNOSIS — N179 Acute kidney failure, unspecified: Secondary | ICD-10-CM | POA: Diagnosis present

## 2022-10-16 DIAGNOSIS — Z950 Presence of cardiac pacemaker: Secondary | ICD-10-CM | POA: Diagnosis not present

## 2022-10-16 DIAGNOSIS — W07XXXA Fall from chair, initial encounter: Secondary | ICD-10-CM | POA: Diagnosis present

## 2022-10-16 DIAGNOSIS — Y92009 Unspecified place in unspecified non-institutional (private) residence as the place of occurrence of the external cause: Secondary | ICD-10-CM

## 2022-10-16 DIAGNOSIS — Z90710 Acquired absence of both cervix and uterus: Secondary | ICD-10-CM | POA: Diagnosis not present

## 2022-10-16 DIAGNOSIS — H9190 Unspecified hearing loss, unspecified ear: Secondary | ICD-10-CM | POA: Diagnosis present

## 2022-10-16 DIAGNOSIS — Z96651 Presence of right artificial knee joint: Secondary | ICD-10-CM | POA: Diagnosis present

## 2022-10-16 DIAGNOSIS — Z833 Family history of diabetes mellitus: Secondary | ICD-10-CM

## 2022-10-16 DIAGNOSIS — R791 Abnormal coagulation profile: Secondary | ICD-10-CM | POA: Diagnosis present

## 2022-10-16 DIAGNOSIS — K7460 Unspecified cirrhosis of liver: Secondary | ICD-10-CM | POA: Diagnosis present

## 2022-10-16 DIAGNOSIS — G4730 Sleep apnea, unspecified: Secondary | ICD-10-CM | POA: Diagnosis present

## 2022-10-16 DIAGNOSIS — Z9049 Acquired absence of other specified parts of digestive tract: Secondary | ICD-10-CM | POA: Diagnosis not present

## 2022-10-16 DIAGNOSIS — I447 Left bundle-branch block, unspecified: Secondary | ICD-10-CM | POA: Diagnosis present

## 2022-10-16 DIAGNOSIS — I959 Hypotension, unspecified: Secondary | ICD-10-CM | POA: Diagnosis present

## 2022-10-16 DIAGNOSIS — B9620 Unspecified Escherichia coli [E. coli] as the cause of diseases classified elsewhere: Secondary | ICD-10-CM | POA: Diagnosis present

## 2022-10-16 DIAGNOSIS — R7881 Bacteremia: Secondary | ICD-10-CM | POA: Diagnosis present

## 2022-10-16 DIAGNOSIS — D72829 Elevated white blood cell count, unspecified: Secondary | ICD-10-CM | POA: Diagnosis present

## 2022-10-16 DIAGNOSIS — S0990XA Unspecified injury of head, initial encounter: Secondary | ICD-10-CM | POA: Diagnosis not present

## 2022-10-16 DIAGNOSIS — K219 Gastro-esophageal reflux disease without esophagitis: Secondary | ICD-10-CM | POA: Diagnosis present

## 2022-10-16 DIAGNOSIS — E1165 Type 2 diabetes mellitus with hyperglycemia: Secondary | ICD-10-CM | POA: Diagnosis present

## 2022-10-16 DIAGNOSIS — Z8673 Personal history of transient ischemic attack (TIA), and cerebral infarction without residual deficits: Secondary | ICD-10-CM | POA: Diagnosis not present

## 2022-10-16 DIAGNOSIS — R0989 Other specified symptoms and signs involving the circulatory and respiratory systems: Secondary | ICD-10-CM | POA: Diagnosis not present

## 2022-10-16 DIAGNOSIS — N390 Urinary tract infection, site not specified: Secondary | ICD-10-CM | POA: Diagnosis not present

## 2022-10-16 DIAGNOSIS — I10 Essential (primary) hypertension: Secondary | ICD-10-CM | POA: Diagnosis present

## 2022-10-16 DIAGNOSIS — G9341 Metabolic encephalopathy: Secondary | ICD-10-CM | POA: Diagnosis present

## 2022-10-16 DIAGNOSIS — I509 Heart failure, unspecified: Secondary | ICD-10-CM | POA: Diagnosis not present

## 2022-10-16 DIAGNOSIS — Z79899 Other long term (current) drug therapy: Secondary | ICD-10-CM

## 2022-10-16 DIAGNOSIS — Z8249 Family history of ischemic heart disease and other diseases of the circulatory system: Secondary | ICD-10-CM

## 2022-10-16 DIAGNOSIS — R4182 Altered mental status, unspecified: Secondary | ICD-10-CM | POA: Diagnosis not present

## 2022-10-16 DIAGNOSIS — Z7984 Long term (current) use of oral hypoglycemic drugs: Secondary | ICD-10-CM

## 2022-10-16 DIAGNOSIS — M1711 Unilateral primary osteoarthritis, right knee: Secondary | ICD-10-CM | POA: Diagnosis present

## 2022-10-16 LAB — URINALYSIS, W/ REFLEX TO CULTURE (INFECTION SUSPECTED)
Bilirubin Urine: NEGATIVE
Glucose, UA: NEGATIVE mg/dL
Ketones, ur: NEGATIVE mg/dL
Nitrite: NEGATIVE
Protein, ur: 100 mg/dL — AB
Specific Gravity, Urine: 1.009 (ref 1.005–1.030)
WBC, UA: >50 WBC/hpf (ref 0–5)
pH: 6 (ref 5.0–8.0)

## 2022-10-16 LAB — COMPREHENSIVE METABOLIC PANEL
ALT: 34 U/L (ref 0–44)
AST: 63 U/L — ABNORMAL HIGH (ref 15–41)
Albumin: 3.3 g/dL — ABNORMAL LOW (ref 3.5–5.0)
Alkaline Phosphatase: 86 U/L (ref 38–126)
Anion gap: 13 (ref 5–15)
BUN: 54 mg/dL — ABNORMAL HIGH (ref 8–23)
CO2: 20 mmol/L — ABNORMAL LOW (ref 22–32)
Calcium: 9.6 mg/dL (ref 8.9–10.3)
Chloride: 96 mmol/L — ABNORMAL LOW (ref 98–111)
Creatinine, Ser: 2.57 mg/dL — ABNORMAL HIGH (ref 0.44–1.00)
GFR, Estimated: 20 mL/min — ABNORMAL LOW (ref >60)
Glucose, Bld: 196 mg/dL — ABNORMAL HIGH (ref 70–99)
Potassium: 5 mmol/L (ref 3.5–5.1)
Sodium: 129 mmol/L — ABNORMAL LOW (ref 135–145)
Total Bilirubin: 1.2 mg/dL (ref 0.3–1.2)
Total Protein: 6.4 g/dL — ABNORMAL LOW (ref 6.5–8.1)

## 2022-10-16 LAB — CBC
HCT: 34.5 % — ABNORMAL LOW (ref 36.0–46.0)
Hemoglobin: 11.6 g/dL — ABNORMAL LOW (ref 12.0–15.0)
MCH: 30.7 pg (ref 26.0–34.0)
MCHC: 33.6 g/dL (ref 30.0–36.0)
MCV: 91.3 fL (ref 80.0–100.0)
Platelets: 113 10*3/uL — ABNORMAL LOW (ref 150–400)
RBC: 3.78 MIL/uL — ABNORMAL LOW (ref 3.87–5.11)
RDW: 13.7 % (ref 11.5–15.5)
WBC: 28.6 10*3/uL — ABNORMAL HIGH (ref 4.0–10.5)
nRBC: 0 % (ref 0.0–0.2)

## 2022-10-16 LAB — PROTIME-INR
INR: 2.4 — ABNORMAL HIGH (ref 0.8–1.2)
Prothrombin Time: 25.9 seconds — ABNORMAL HIGH (ref 11.4–15.2)

## 2022-10-16 LAB — LACTIC ACID, PLASMA
Lactic Acid, Venous: 2.2 mmol/L (ref 0.5–1.9)
Lactic Acid, Venous: 2.6 mmol/L (ref 0.5–1.9)

## 2022-10-16 LAB — AMMONIA: Ammonia: 15 umol/L (ref 9–35)

## 2022-10-16 LAB — CBG MONITORING, ED: Glucose-Capillary: 135 mg/dL — ABNORMAL HIGH (ref 70–99)

## 2022-10-16 MED ORDER — ACETAMINOPHEN 325 MG PO TABS
650.0000 mg | ORAL_TABLET | Freq: Four times a day (QID) | ORAL | Status: DC | PRN
  Administered 2022-10-18 – 2022-10-21 (×8): 650 mg via ORAL
  Filled 2022-10-16 (×8): qty 2

## 2022-10-16 MED ORDER — AMIODARONE HCL 200 MG PO TABS
200.0000 mg | ORAL_TABLET | Freq: Every day | ORAL | Status: DC
  Administered 2022-10-17: 200 mg via ORAL
  Filled 2022-10-16: qty 1

## 2022-10-16 MED ORDER — CITALOPRAM HYDROBROMIDE 20 MG PO TABS
10.0000 mg | ORAL_TABLET | Freq: Every day | ORAL | Status: DC
  Administered 2022-10-17 – 2022-10-21 (×5): 10 mg via ORAL
  Filled 2022-10-16 (×5): qty 1

## 2022-10-16 MED ORDER — ONDANSETRON HCL 4 MG PO TABS
4.0000 mg | ORAL_TABLET | Freq: Four times a day (QID) | ORAL | Status: DC | PRN
  Administered 2022-10-18: 4 mg via ORAL
  Filled 2022-10-16: qty 1

## 2022-10-16 MED ORDER — WARFARIN SODIUM 5 MG PO TABS: 5.0000 mg | ORAL_TABLET | Freq: Every day | ORAL | Status: DC

## 2022-10-16 MED ORDER — PANTOPRAZOLE SODIUM 40 MG PO TBEC
40.0000 mg | DELAYED_RELEASE_TABLET | Freq: Two times a day (BID) | ORAL | Status: DC
  Administered 2022-10-17 – 2022-10-21 (×10): 40 mg via ORAL
  Filled 2022-10-16 (×10): qty 1

## 2022-10-16 MED ORDER — SODIUM CHLORIDE 0.9 % IV SOLN
2.0000 g | Freq: Once | INTRAVENOUS | Status: AC
  Administered 2022-10-16: 2 g via INTRAVENOUS
  Filled 2022-10-16: qty 20

## 2022-10-16 MED ORDER — HEPARIN SODIUM (PORCINE) 5000 UNIT/ML IJ SOLN: 5000.0000 [IU] | Freq: Three times a day (TID) | INTRAMUSCULAR | Status: DC

## 2022-10-16 MED ORDER — ONDANSETRON HCL 4 MG/2ML IJ SOLN
4.0000 mg | Freq: Four times a day (QID) | INTRAMUSCULAR | Status: DC | PRN
  Administered 2022-10-17 – 2022-10-20 (×8): 4 mg via INTRAVENOUS
  Filled 2022-10-16 (×8): qty 2

## 2022-10-16 MED ORDER — GABAPENTIN 300 MG PO CAPS
300.0000 mg | ORAL_CAPSULE | Freq: Three times a day (TID) | ORAL | Status: DC | PRN
  Administered 2022-10-20: 300 mg via ORAL
  Filled 2022-10-16: qty 1

## 2022-10-16 MED ORDER — ACETAMINOPHEN 650 MG RE SUPP: 650.0000 mg | Freq: Four times a day (QID) | RECTAL | Status: DC | PRN

## 2022-10-16 MED ORDER — SACUBITRIL-VALSARTAN 24-26 MG PO TABS
1.0000 | ORAL_TABLET | Freq: Two times a day (BID) | ORAL | Status: DC
  Administered 2022-10-17 – 2022-10-18 (×3): 1 via ORAL
  Filled 2022-10-16 (×4): qty 1

## 2022-10-16 MED ORDER — OXYCODONE HCL 5 MG PO TABS
5.0000 mg | ORAL_TABLET | ORAL | Status: DC | PRN
  Filled 2022-10-16: qty 1

## 2022-10-16 MED ORDER — LAMOTRIGINE 100 MG PO TABS
200.0000 mg | ORAL_TABLET | Freq: Two times a day (BID) | ORAL | Status: DC
  Administered 2022-10-17 – 2022-10-21 (×10): 200 mg via ORAL
  Filled 2022-10-16: qty 8
  Filled 2022-10-16 (×9): qty 2

## 2022-10-16 MED ORDER — LACTULOSE 10 GM/15ML PO SOLN
20.0000 g | Freq: Three times a day (TID) | ORAL | Status: DC
  Administered 2022-10-17 – 2022-10-21 (×12): 20 g via ORAL
  Filled 2022-10-16 (×14): qty 30

## 2022-10-16 MED ORDER — METOPROLOL TARTRATE 25 MG PO TABS
12.5000 mg | ORAL_TABLET | Freq: Two times a day (BID) | ORAL | Status: DC
  Administered 2022-10-17: 12.5 mg via ORAL
  Filled 2022-10-16 (×4): qty 1

## 2022-10-16 MED ORDER — TRAZODONE HCL 50 MG PO TABS
50.0000 mg | ORAL_TABLET | Freq: Every evening | ORAL | Status: DC | PRN
  Administered 2022-10-17: 50 mg via ORAL
  Filled 2022-10-16 (×2): qty 1

## 2022-10-16 MED ORDER — SODIUM CHLORIDE 0.9 % IV SOLN: 1.0000 g | INTRAVENOUS | Status: DC

## 2022-10-16 MED ORDER — SODIUM CHLORIDE 0.9 % IV BOLUS
500.0000 mL | Freq: Once | INTRAVENOUS | Status: AC
  Administered 2022-10-16: 500 mL via INTRAVENOUS

## 2022-10-16 NOTE — ED Provider Notes (Signed)
[place] EMERGENCY DEPARTMENT AT [facility] Provider Note   CSN: [id number] Arrival date & time: 10/16/22  1714     History  Chief Complaint  Patient presents with   Fall   Altered Mental Status    Carol Richmond is a 72 y.o. female.   Fall  Altered Mental Status Patient presents with confusion.  Has had more confused for the last 3 days.  Has had a few different falls.  States she is unsteady.  Is taking lactulose 4 times a day for NASH cirrhosis.  Reportedly hit her legs.  Family thinks her ammonia is going to be elevated.     Home Medications Prior to Admission medications   Medication Sig Start Date End Date Taking? Authorizing Provider  Accu-Chek Softclix Lancets lancets check blood sugars twice daily DX E11.65 11/28/21   Gabriel Earing, FNP  acetaminophen (TYLENOL) 500 MG tablet Take 500 mg by mouth daily as needed for mild pain.    [provider]  amiodarone (PACERONE) 200 MG tablet Take 2 tablets (400 mg total) by mouth 2 (two) times daily for 9 days, THEN 1 tablet (200 mg total) daily. 08/15/22 11/22/22  Perlie Gold, PA-C  Azelastine HCl 0.15 % SOLN     [provider]  Blood Pressure Monitoring (OMRON 3 SERIES BP MONITOR) DEVI Use as directed 08/09/22   Sharlene Dory, NP  cetirizine (ZYRTEC ALLERGY) 10 MG tablet Take 1 tablet (10 mg total) by mouth daily. 07/30/22   Junie Spencer, FNP  citalopram (CELEXA) 10 MG tablet Take 1 tablet (10 mg total) by mouth daily. 08/28/22   Milian, Aleen Campi, FNP  fluticasone (FLONASE) 50 MCG/ACT nasal spray Place 2 sprays into both nostrils daily. Patient taking differently: Place 2 sprays into both nostrils daily as needed for allergies. 07/30/22   Junie Spencer, FNP  furosemide (LASIX) 40 MG tablet Take 40 mg by mouth daily. 08/20/22   [provider]  gabapentin (NEURONTIN) 300 MG capsule Take 300 mg by mouth 3 (three) times daily as needed (neuropathy).    [provider]  glucose blood (ACCU-CHEK GUIDE) test strip USE AS INSTRUCTED TO TEST BLOOD SUGAR DAILY AS DIRECTED. 09/10/22   Gabriel Earing, FNP  lactulose (CHRONULAC) 10 GM/15ML solution Take 30 mLs (20 g total) by mouth 3 (three) times daily. 09/30/22   Dolores Frame, MD  lamoTRIgine (LAMICTAL) 200 MG tablet TAKE (1) TABLET TWICE DAILY. Patient taking differently: Take 200 mg by mouth 2 (two) times daily. 01/21/22   Gabriel Earing, FNP  LORazepam (ATIVAN) 0.5 MG tablet Take 1 tablet (0.5 mg total) by mouth 2 (two) times daily as needed for anxiety. 07/22/22   Gabriel Earing, FNP  magic mouthwash (lidocaine, diphenhydrAMINE, alum & mag hydroxide) suspension Swish and swallow 5 mLs 3 (three) times daily as needed for mouth pain. 09/30/22   Dolores Frame, MD  metFORMIN (GLUCOPHAGE) 500 MG tablet Take 1 tablet (500 mg total) by mouth 2 (two) times daily with a meal. 07/22/22   Gabriel Earing, FNP  metoprolol tartrate (LOPRESSOR) 25 MG tablet Take 0.5 tablets (12.5 mg total) by mouth 2 (two) times daily. 08/15/22   Perlie Gold, PA-C  montelukast (SINGULAIR) 10 MG tablet Take 1 tablet (10 mg total) by mouth at bedtime. 09/20/22   Gabriel Earing, FNP  Multiple Vitamins-Minerals (MULTIVITAMIN WOMEN 50+) TABS Take 1 tablet by mouth daily.    [provider]  ondansetron [facility])  4 MG tablet take 1 tablet by mouth every 8 hours as needed for nausea and vomiting. 10/08/22   Marguerita Merles, Reuel Boom, MD  pantoprazole (PROTONIX) 40 MG tablet TAKE (1) TABLET TWICE DAILY. Patient taking differently: Take 40 mg by mouth 2 (two) times daily. 06/10/22   Carlan, Chelsea L, NP  potassium chloride SA (KLOR-CON M) 20 MEQ tablet Take 20 mEq by mouth 2 (two) times daily. 09/03/22   [provider]  sacubitril-valsartan (ENTRESTO) 24-26 MG Take 1 tablet by mouth 2 (two) times daily. 02/04/22   Jonelle Sidle, MD  traMADol (ULTRAM) 50 MG tablet Take 50 mg by mouth every 6  (six) hours as needed.    [provider]  traZODone (DESYREL) 50 MG tablet Take 1 tablet (50 mg total) by mouth at bedtime as needed for sleep. 07/22/22   Gabriel Earing, FNP  warfarin (COUMADIN) 5 MG tablet Take 1 tablet (5 mg total) by mouth daily. Or as directed by [facility]madin Clinic. 08/16/22 08/16/23  Perlie Gold, PA-C      Allergies    Patient has no known allergies.    Review of Systems   Review of Systems  Physical Exam Updated Vital Signs BP 128/64   Pulse 88   Temp 99.2 F (37.3 C) (Oral)   Resp 19   SpO2 92%  Physical Exam Vitals and nursing note reviewed.  HENT:     Head: Normocephalic.  Cardiovascular:     Rate and Rhythm: Regular rhythm.  Pulmonary:     Breath sounds: No wheezing.  Abdominal:     Tenderness: There is no abdominal tenderness.  Musculoskeletal:        General: No swelling or tenderness.  Skin:    Comments: My bruising near her knees.  Neurological:     Mental Status: She is alert and oriented to person, place, and time.     Comments: Awake and answers questions, however confused per family members.     ED Results / Procedures / Treatments   Labs (all labs ordered are listed, but only abnormal results are displayed) Labs Reviewed  COMPREHENSIVE METABOLIC PANEL - Abnormal; Notable for the following components:      Result Value   Sodium 129 (*)    Chloride 96 (*)    CO2 20 (*)    Glucose, Bld 196 (*)    BUN 54 (*)    Creatinine, Ser 2.57 (*)    Total Protein 6.4 (*)    Albumin 3.3 (*)    AST 63 (*)    GFR, Estimated 20 (*)    All other components within normal limits  CBC - Abnormal; Notable for the following components:   WBC 28.6 (*)    RBC 3.78 (*)    Hemoglobin 11.6 (*)    HCT 34.5 (*)    Platelets 113 (*)    All other components within normal limits  PROTIME-INR - Abnormal; Notable for the following components:   Prothrombin Time 25.9 (*)    INR 2.4 (*)    All other components within normal limits  URINALYSIS,  W/ REFLEX TO CULTURE (INFECTION SUSPECTED) - Abnormal; Notable for the following components:   APPearance CLOUDY (*)    Hgb urine dipstick LARGE (*)    Protein, ur 100 (*)    Leukocytes,Ua LARGE (*)    Bacteria, UA MANY (*)    All other components within normal limits  LACTIC ACID, PLASMA - Abnormal; Notable for the following components:  Lactic Acid, Venous 2.2 (*)    All other components within normal limits  LACTIC ACID, PLASMA - Abnormal; Notable for the following components:   Lactic Acid, Venous 2.6 (*)    All other components within normal limits  CBG MONITORING, ED - Abnormal; Notable for the following components:   Glucose-Capillary 135 (*)    All other components within normal limits  CULTURE, BLOOD (ROUTINE X 2)  CULTURE, BLOOD (ROUTINE X 2)  URINE CULTURE  AMMONIA    EKG EKG Interpretation Date/Time:  Wednesday October 16 2022 20:03:26 EDT Ventricular Rate:  80 PR Interval:  184 QRS Duration:  166 QT Interval:  424 QTC Calculation: 490 R Axis:   -39  Text Interpretation: Sinus rhythm Left bundle branch block Confirmed by Benjiman Core [phone]) on 10/16/2022 9:07:34 PM  Radiology DG Chest Portable 1 View  Result Date: 10/16/2022 CLINICAL DATA:  Recent fall with altered mental status, initial encounter EXAM: PORTABLE CHEST 1 VIEW COMPARISON:  08/11/2022 FINDINGS: Cardiac shadow is enlarged but stable. Pacing device is again seen. The lungs are well aerated bilaterally. No focal infiltrate or effusion is seen. No bony abnormality is noted. Mild central vascular congestion is noted. IMPRESSION: Mild CHF without significant edema. Electronically Signed   By: Alcide Clever M.D.   On: 10/16/2022 20:49   CT Head Wo Contrast  Result Date: 10/16/2022 CLINICAL DATA:  Head trauma, moderate severe. EXAM: CT HEAD WITHOUT CONTRAST TECHNIQUE: Contiguous axial images were obtained from the base of the skull through the vertex without intravenous contrast. RADIATION DOSE  REDUCTION: This exam was performed according to the departmental dose-optimization program which includes automated exposure control, adjustment of the mA and/or kV according to patient size and/or use of iterative reconstruction technique. COMPARISON:  Head CT 06/16/2017.  MRI brain 05/25/2017. FINDINGS: Brain: No evidence of acute infarction, hemorrhage, hydrocephalus, extra-axial collection or mass lesion/mass effect. There is stable mild periventricular white matter hypodensity, likely chronic small vessel ischemic change. Vascular: No hyperdense vessel or unexpected calcification. Skull: Normal. Negative for fracture or focal lesion. Sinuses/Orbits: No acute finding. Other: None. IMPRESSION: No acute intracranial process. Stable mild chronic small vessel ischemic change. Electronically Signed   By: Darliss Cheney M.D.   On: 10/16/2022 20:37    Procedures Procedures    Medications Ordered in ED Medications  cefTRIAXone (ROCEPHIN) 2 g in sodium chloride 0.9 % 100 mL IVPB (2 g Intravenous New Bag/Given 10/16/22 2200)  sodium chloride 0.9 % bolus 500 mL (0 mLs Intravenous Stopped 10/16/22 2101)    ED Course/ Medical Decision Making/ A&P                                 Medical Decision Making Amount and/or Complexity of Data Reviewed Labs: ordered. Radiology: ordered.   Patient with confusion and falls.  History of [place] cirrhosis.  Creatinine has now gone up to 2.6 from a normal baseline a couple weeks ago.  White count also elevated at 28.  However no fevers.  No dysuria.  Has had some diarrhea but no reported change in it.  Mild hypotension.  Reported good oral intake will give small fluid bolus.  Will get head CT.  Will likely require admission to hospital.  Chest x-ray showed if anything little volume overload.  However urinalysis shows likely UTI.  Will treat.  Afebrile and lactic acid is 2.2.  Patient already has edema on her legs.  I think  her intravascular volume is already up and will  not give 30/kg bolus.  Cultures been sent.  Repeat lactic acid elevated at 2.6.   CRITICAL CARE Performed by: Benjiman Core Total critical care time: 30 minutes Critical care time was exclusive of separately billable procedures and treating other patients. Critical care was necessary to treat or prevent imminent or life-threatening deterioration. Critical care was time spent personally by me on the following activities: development of treatment plan with patient and/or surrogate as well as nursing, discussions with consultants, evaluation of patient's response to treatment, examination of patient, obtaining history from patient or surrogate, ordering and performing treatments and interventions, ordering and review of laboratory studies, ordering and review of radiographic studies, pulse oximetry and re-evaluation of patient's condition.        Final Clinical Impression(s) / ED Diagnoses Final diagnoses:  NASH (nonalcoholic steatohepatitis)  Acute cystitis without hematuria  AKI (acute kidney injury) [facility])    Rx / DC Orders ED Discharge Orders     None         Benjiman Core, MD 10/16/22 2218

## 2022-10-16 NOTE — ED Triage Notes (Signed)
Pt bib daughters for AMS and multiple falls today. Pt has cirrhosis and has been taking lactulose 3 times a day. Pt is alert and oriented x 4 in triage. Daughters state pt color is off and she is slow to answer questions in triage.

## 2022-10-16 NOTE — ED Notes (Signed)
Patient transported to CT 

## 2022-10-16 NOTE — ED Triage Notes (Signed)
Pt denies any pain and per daughters pts fall was witnessed with no injury she slid out of chair.. Denies any LOC. Denies hitting head.

## 2022-10-16 NOTE — Progress Notes (Deleted)
Cardiology Office Note  Date: 10/16/2022   ID: Carol, Richmond [birth year], MRN [id number]  History of Present Illness: Carol Richmond is a 72 y.o. female  Medtronic pacemaker in place with history of complete heart block followed by Dr. Ladona Ridgel.  Also prior AVNRT ablation.  Physical Exam: VS:  There were no vitals taken for this visit., BMI There is no height or weight on file to calculate BMI.  Wt Readings from Last 3 Encounters:  10/14/22 154 lb (69.9 kg)  10/09/22 153 lb 6.4 oz (69.6 kg)  09/30/22 155 lb 9.6 oz (70.6 kg)    General: Patient appears comfortable at rest. HEENT: Conjunctiva and lids normal, oropharynx clear with moist mucosa. Neck: Supple, no elevated JVP or carotid bruits, no thyromegaly. Lungs: Clear to auscultation, nonlabored breathing at rest. Cardiac: Regular rate and rhythm, no S3 or significant systolic murmur, no pericardial rub. Abdomen: Soft, nontender, no hepatomegaly, bowel sounds present, no guarding or rebound. Extremities: No pitting edema, distal pulses 2+. Skin: Warm and dry. Musculoskeletal: No kyphosis. Neuropsychiatric: Alert and oriented x3, affect grossly appropriate.  ECG:  An ECG dated 08/26/2022 was personally reviewed today and demonstrated:  Sinus rhythm with left bundle branch block.  Labwork: 08/12/2022: B Natriuretic Peptide 950.5 09/09/2022: Magnesium 1.7; TSH 1.520 10/03/2022: ALT 16; AST 24; BUN 16; Creat 0.89; Hemoglobin 13.4; Platelets 173; Potassium 5.0; Sodium 141     Component Value Date/Time   CHOL 213 (H) 09/12/2021 0927   TRIG 380 (H) 09/12/2021 0927   HDL 47 09/12/2021 0927   CHOLHDL 4.5 (H) 09/12/2021 0927   CHOLHDL 5.2 05/08/2007 0530   VLDL 38 05/08/2007 0530   LDLCALC 102 (H) 09/12/2021 0927   Other Studies Reviewed Today:  TEE 08/14/2022:  1. Left ventricular ejection fraction, by estimation, is 25 to 30%. The  left ventricle has severely decreased function. The left ventricle  demonstrates  global hypokinesis.   2. Right ventricular systolic function is severely reduced. The right  ventricular size is mildly enlarged.   3. Left atrial size was moderately dilated. No left atrial/left atrial  appendage thrombus was detected.   4. The mitral valve is rheumatic. Severe mitral valve regurgitation.  Severe mitral stenosis.   5. The aortic valve is tricuspid. Aortic valve regurgitation is trivial.   Mitral Valve: Rheumatic mitral valve with restricted posterior mitral  valve. MV area by planimetry 1.19 cm2. The mitral valve is rheumatic.  Severe mitral valve regurgitation. Severe mitral valve stenosis. MV peak  gradient, 17.7 mmHg. The mean mitral  valve gradient is 9.5 mmHg.   Assessment and Plan:  1.  Rheumatic mitral valve disease with evidence of severe mitral regurgitation and stenosis by TEE in June, mean MV gradient 9.5 mmHg and MVA by planimetry 1.19 cm.  Not candidate for percutaneous valve intervention.  2.  HFrEF, LVEF 25 to 30% with global hypokinesis as of June.  Also severe RV dysfunction.  3.  Paroxysmal atrial fibrillation/flutter with CHA2DS2-VASc score of 7.  She continues on Coumadin with follow-up in the anticoagulation clinic.  4.  Medtronic pacemaker in place with history of complete heart block, followed by Dr. Ladona Ridgel.  Device interrogation in July revealed normal function with approximately 11% AF burden.  5.  Essential hypertension.  6.  Asymptomatic carotid artery disease with history of right CEA.  Recent carotid Dopplers show mild bilateral ICA stenosis.  7.  NASH cirrhosis with grade 1 esophageal varices and GERD.  No history of encephalopathy on  lactulose.  Disposition:  Follow up {follow up:15908}  Signed, Jonelle Sidle, M.D., F.A.C.C. [facility] at [facility]

## 2022-10-17 ENCOUNTER — Encounter (HOSPITAL_COMMUNITY): Payer: Self-pay | Admitting: Family Medicine

## 2022-10-17 DIAGNOSIS — G9341 Metabolic encephalopathy: Secondary | ICD-10-CM

## 2022-10-17 DIAGNOSIS — K7460 Unspecified cirrhosis of liver: Secondary | ICD-10-CM | POA: Diagnosis not present

## 2022-10-17 DIAGNOSIS — N179 Acute kidney failure, unspecified: Secondary | ICD-10-CM | POA: Diagnosis not present

## 2022-10-17 DIAGNOSIS — N390 Urinary tract infection, site not specified: Secondary | ICD-10-CM | POA: Insufficient documentation

## 2022-10-17 DIAGNOSIS — N3000 Acute cystitis without hematuria: Secondary | ICD-10-CM

## 2022-10-17 DIAGNOSIS — E1165 Type 2 diabetes mellitus with hyperglycemia: Secondary | ICD-10-CM

## 2022-10-17 DIAGNOSIS — K7581 Nonalcoholic steatohepatitis (NASH): Secondary | ICD-10-CM

## 2022-10-17 DIAGNOSIS — I4891 Unspecified atrial fibrillation: Secondary | ICD-10-CM | POA: Diagnosis not present

## 2022-10-17 LAB — CBC WITH DIFFERENTIAL/PLATELET
Abs Immature Granulocytes: 0.93 10*3/uL — ABNORMAL HIGH (ref 0.00–0.07)
Basophils Absolute: 0 10*3/uL (ref 0.0–0.1)
Basophils Relative: 0 %
Eosinophils Absolute: 0.2 10*3/uL (ref 0.0–0.5)
Eosinophils Relative: 1 %
HCT: 31.1 % — ABNORMAL LOW (ref 36.0–46.0)
Hemoglobin: 10.6 g/dL — ABNORMAL LOW (ref 12.0–15.0)
Immature Granulocytes: 5 %
Lymphocytes Relative: 4 %
Lymphs Abs: 0.9 10*3/uL (ref 0.7–4.0)
MCH: 30.3 pg (ref 26.0–34.0)
MCHC: 34.1 g/dL (ref 30.0–36.0)
MCV: 88.9 fL (ref 80.0–100.0)
Monocytes Absolute: 2.4 10*3/uL — ABNORMAL HIGH (ref 0.1–1.0)
Monocytes Relative: 11 %
Neutro Abs: 16.4 10*3/uL — ABNORMAL HIGH (ref 1.7–7.7)
Neutrophils Relative %: 79 %
Platelets: 101 10*3/uL — ABNORMAL LOW (ref 150–400)
RBC: 3.5 MIL/uL — ABNORMAL LOW (ref 3.87–5.11)
RDW: 13.6 % (ref 11.5–15.5)
WBC: 20.8 10*3/uL — ABNORMAL HIGH (ref 4.0–10.5)
nRBC: 0 % (ref 0.0–0.2)

## 2022-10-17 LAB — COMPREHENSIVE METABOLIC PANEL
ALT: 33 U/L (ref 0–44)
AST: 56 U/L — ABNORMAL HIGH (ref 15–41)
Albumin: 2.9 g/dL — ABNORMAL LOW (ref 3.5–5.0)
Alkaline Phosphatase: 74 U/L (ref 38–126)
Anion gap: 11 (ref 5–15)
BUN: 55 mg/dL — ABNORMAL HIGH (ref 8–23)
CO2: 21 mmol/L — ABNORMAL LOW (ref 22–32)
Calcium: 8.8 mg/dL — ABNORMAL LOW (ref 8.9–10.3)
Chloride: 98 mmol/L (ref 98–111)
Creatinine, Ser: 2.17 mg/dL — ABNORMAL HIGH (ref 0.44–1.00)
GFR, Estimated: 24 mL/min — ABNORMAL LOW (ref >60)
Glucose, Bld: 163 mg/dL — ABNORMAL HIGH (ref 70–99)
Potassium: 4.1 mmol/L (ref 3.5–5.1)
Sodium: 130 mmol/L — ABNORMAL LOW (ref 135–145)
Total Bilirubin: 1.3 mg/dL — ABNORMAL HIGH (ref 0.3–1.2)
Total Protein: 6.1 g/dL — ABNORMAL LOW (ref 6.5–8.1)

## 2022-10-17 LAB — BLOOD CULTURE ID PANEL (REFLEXED) - BCID2

## 2022-10-17 LAB — GLUCOSE, CAPILLARY
Glucose-Capillary: 165 mg/dL — ABNORMAL HIGH (ref 70–99)
Glucose-Capillary: 139 mg/dL — ABNORMAL HIGH (ref 70–99)
Glucose-Capillary: 145 mg/dL — ABNORMAL HIGH (ref 70–99)
Glucose-Capillary: 125 mg/dL — ABNORMAL HIGH (ref 70–99)

## 2022-10-17 LAB — HIV ANTIBODY (ROUTINE TESTING W REFLEX): HIV Screen 4th Generation wRfx: NONREACTIVE

## 2022-10-17 LAB — TSH: TSH: 1.023 u[IU]/mL (ref 0.350–4.500)

## 2022-10-17 LAB — MAGNESIUM: Magnesium: 2.1 mg/dL (ref 1.7–2.4)

## 2022-10-17 LAB — LACTIC ACID, PLASMA: Lactic Acid, Venous: 1.5 mmol/L (ref 0.5–1.9)

## 2022-10-17 MED ORDER — INSULIN ASPART 100 UNIT/ML IJ SOLN: 0.0000 [IU] | Freq: Every day | INTRAMUSCULAR | Status: DC

## 2022-10-17 MED ORDER — LACTATED RINGERS IV SOLN: INTRAVENOUS | Status: AC

## 2022-10-17 MED ORDER — WARFARIN SODIUM 5 MG PO TABS
5.0000 mg | ORAL_TABLET | Freq: Once | ORAL | Status: AC
  Administered 2022-10-17: 5 mg via ORAL
  Filled 2022-10-17: qty 1

## 2022-10-17 MED ORDER — WARFARIN - PHARMACIST DOSING INPATIENT: Freq: Every day | Status: DC

## 2022-10-17 MED ORDER — AMIODARONE HCL 200 MG PO TABS
200.0000 mg | ORAL_TABLET | Freq: Every day | ORAL | Status: DC
  Administered 2022-10-18 – 2022-10-21 (×4): 200 mg via ORAL
  Filled 2022-10-17 (×4): qty 1

## 2022-10-17 MED ORDER — SODIUM CHLORIDE 0.9 % IV SOLN
2.0000 g | INTRAVENOUS | Status: DC
  Administered 2022-10-17 – 2022-10-18 (×2): 2 g via INTRAVENOUS
  Filled 2022-10-17 (×2): qty 20

## 2022-10-17 MED ORDER — INSULIN ASPART 100 UNIT/ML IJ SOLN
0.0000 [IU] | Freq: Three times a day (TID) | INTRAMUSCULAR | Status: DC
  Administered 2022-10-17 (×2): 2 [IU] via SUBCUTANEOUS
  Administered 2022-10-17: 3 [IU] via SUBCUTANEOUS
  Administered 2022-10-18 – 2022-10-20 (×7): 2 [IU] via SUBCUTANEOUS
  Administered 2022-10-21: 3 [IU] via SUBCUTANEOUS

## 2022-10-17 NOTE — ED Notes (Signed)
ED TO INPATIENT HANDOFF REPORT  ED Nurse Name and Phone #: Lowella Petties Name/Age/Gender Carol Richmond 72 y.o. female Room/Bed: APAH4/APAH4  Code Status   Code Status: Full Code  Home/SNF/Other Home Patient oriented to: self, place, and situation Is this baseline? No   Triage Complete: Triage complete  Chief Complaint AKI (acute kidney injury) (HCC) [N17.9]  Triage Note Pt bib daughters for AMS and multiple falls today. Pt has cirrhosis and has been taking lactulose 3 times a day. Pt is alert and oriented x 4 in triage. Daughters state pt color is off and she is slow to answer questions in triage.   Pt denies any pain and per daughters pts fall was witnessed with no injury she slid out of chair.. Denies any LOC. Denies hitting head.    Allergies No Known Allergies  Level of Care/Admitting Diagnosis ED Disposition     ED Disposition  Admit   Condition  --   Comment  Hospital Area: [facility] [[id number]]  Level of Care: Med-Surg [16]  Covid Evaluation: Asymptomatic - no recent exposure (last 10 days) testing not required  Diagnosis: AKI (acute kidney injury) [facility]) [[id number]]  Admitting Physician: Lilyan Gilford [[id number]]  Attending Physician: Lilyan Gilford [[id number]]  Certification:: I certify this patient will need inpatient services for at least 2 midnights  Expected Medical Readiness: 10/18/2022          B Medical/Surgery History Past Medical History:  Diagnosis Date   Asthma    AVNRT (AV nodal re-entry tachycardia)    Carotid artery disease (HCC)    Cirrhosis (HCC)    Diverticulosis of colon (without mention of hemorrhage)    Essential hypertension    Functional ovarian cysts    GERD (gastroesophageal reflux disease)    Hemorrhoids, external, without mention of complication    Hormone replacement therapy (postmenopausal)    IBS (irritable bowel syndrome)    Migraine    Mitral valve disease    Pacemaker    Complete heart block  - Medtronic   Right knee DJD    Seizures (HCC)    Sleep apnea    TIA (transient ischemic attack)    Type 2 diabetes mellitus (HCC)    Past Surgical History:  Procedure Laterality Date   BIOPSY  09/23/2018   Procedure: BIOPSY;  Surgeon: Malissa Hippo, MD;  [facility];  Service: Endoscopy;;  duodeneum and stomach   CARDIOVERSION N/A 08/14/2022   Procedure: CARDIOVERSION;  Surgeon: [name], MD;  Location: [facility] INVASIVE CV LAB;  Service: Cardiovascular;  Laterality: N/A;   CHOLECYSTECTOMY  1980   ENDARTERECTOMY Right 07/01/2017   Procedure: ENDARTERECTOMY CAROTID RIGHT;  Surgeon: Nada Libman, MD;  Location: [facility] OR;  Service: Vascular;  Laterality: Right;   ESOPHAGOGASTRODUODENOSCOPY N/A 09/23/2018   Procedure: ESOPHAGOGASTRODUODENOSCOPY (EGD);  Surgeon: Malissa Hippo, MD;  [facility];  Service: Endoscopy;  Laterality: N/A;  2:45-moved to 1:45 per Dewayne Hatch   ESOPHAGOGASTRODUODENOSCOPY (EGD) WITH PROPOFOL N/A 03/07/2021   Procedure: ESOPHAGOGASTRODUODENOSCOPY (EGD) WITH PROPOFOL;  Surgeon: Malissa Hippo, MD;  [facility];  Service: Endoscopy;  Laterality: N/A;  12:50   LEFT HEART CATHETERIZATION WITH CORONARY ANGIOGRAM N/A 11/18/2013   Procedure: LEFT HEART CATHETERIZATION WITH CORONARY ANGIOGRAM;  Surgeon: Kathleene Hazel, MD;  Location: [facility] CATH LAB;  Service: Cardiovascular;  Laterality: N/A;   OOPHORECTOMY     2012 Dr. Donzetta Matters in [place]   PACEMAKER IMPLANT N/A 12/14/2021  Procedure: PACEMAKER IMPLANT;  Surgeon: Marinus Maw, MD;  Location: [facility] INVASIVE CV LAB;  Service: Cardiovascular;  Laterality: N/A;   PATCH ANGIOPLASTY Right 07/01/2017   Procedure: RIGHT CAROTID PATCH ANGIOPLASTY;  Surgeon: Nada Libman, MD;  Location: [facility] OR;  Service: Vascular;  Laterality: Right;   SVT ABLATION N/A 12/14/2021   Procedure: SVT ABLATION;  Surgeon: Marinus Maw, MD;  [facility] INVASIVE CV LAB;  Service: Cardiovascular;  Laterality: N/A;   TEE  WITHOUT CARDIOVERSION N/A 03/01/2022   Procedure: TRANSESOPHAGEAL ECHOCARDIOGRAM (TEE);  Surgeon: Thurmon Fair, MD;  Location: [facility] ENDOSCOPY;  Service: Cardiovascular;  Laterality: N/A;   TEE WITHOUT CARDIOVERSION N/A 08/14/2022   Procedure: TRANSESOPHAGEAL ECHOCARDIOGRAM;  Surgeon: [name], MD;  Location: [facility] INVASIVE CV LAB;  Service: Cardiovascular;  Laterality: N/A;   TONSILLECTOMY     TOTAL KNEE ARTHROPLASTY Right 09/02/2012   Procedure: TOTAL KNEE ARTHROPLASTY- right;  Surgeon: Loreta Ave, MD;  Location: [facility] OR;  Service: Orthopedics;  Laterality: Right;   TUBAL LIGATION     TYMPANOMASTOIDECTOMY Left 04/02/2016   Procedure: LEFT CANAL WALL DOWN TYMPANOMASTOIDECTOMY;  Surgeon: Newman Pies, MD;  Location: [facility];  Service: ENT;  Laterality: Left;   VAGINAL HYSTERECTOMY  1990   WOUND EXPLORATION Right 07/02/2017   Procedure: RIGHT NECK EXPLORATION WITH INTRAOPERATIVE ULTRASOUND;  Surgeon: Chuck Hint, MD;  Location: [facility] OR;  Service: Vascular;  Laterality: Right;     A IV Location/Drains/Wounds Patient Lines/Drains/Airways Status     Active Line/Drains/Airways     Name Placement date Placement time Site Days   Peripheral IV 10/16/22 20 G Right Antecubital 10/16/22  2005  Antecubital  1            Intake/Output Last 24 hours  Intake/Output Summary (Last 24 hours) at 10/17/2022 0658 Last data filed at 10/17/2022 0020 Gross per 24 hour  Intake 600 ml  Output --  Net 600 ml    Labs/Imaging Results for orders placed or performed during the hospital encounter of 10/16/22 (from the past 48 hour(s))  Comprehensive metabolic panel     Status: Abnormal   Collection Time: 10/16/22  6:10 PM  Result Value Ref Range   Sodium 129 (L) 135 - 145 mmol/L   Potassium 5.0 3.5 - 5.1 mmol/L   Chloride 96 (L) 98 - 111 mmol/L   CO2 20 (L) 22 - 32 mmol/L   Glucose, Bld 196 (H) 70 - 99 mg/dL    Comment: Glucose reference range applies only to samples taken  after fasting for at least 8 hours.   BUN 54 (H) 8 - 23 mg/dL   Creatinine, Ser 4.09 (H) 0.44 - 1.00 mg/dL   Calcium 9.6 8.9 - 81.1 mg/dL   Total Protein 6.4 (L) 6.5 - 8.1 g/dL   Albumin 3.3 (L) 3.5 - 5.0 g/dL   AST 63 (H) 15 - 41 U/L   ALT 34 0 - 44 U/L   Alkaline Phosphatase 86 38 - 126 U/L   Total Bilirubin 1.2 0.3 - 1.2 mg/dL   GFR, Estimated 20 (L) >60 mL/min    Comment: (NOTE) Calculated using the CKD-EPI Creatinine Equation (2021)    Anion gap 13 5 - 15    Comment: Performed at [facility], [address], [place], [place] [address]  CBC     Status: Abnormal   Collection Time: 10/16/22  6:10 PM  Result Value Ref Range   WBC 28.6 (H) 4.0 - 10.5 K/uL  RBC 3.78 (L) 3.87 - 5.11 MIL/uL   Hemoglobin 11.6 (L) 12.0 - 15.0 g/dL   HCT 46.2 (L) 70.3 - 50.0 %   MCV 91.3 80.0 - 100.0 fL   MCH 30.7 26.0 - 34.0 pg   MCHC 33.6 30.0 - 36.0 g/dL   RDW 93.8 18.2 - 99.3 %   Platelets 113 (L) 150 - 400 K/uL   nRBC 0.0 0.0 - 0.2 %    Comment: Performed at [facility], [address], [place], [place] [address]  Ammonia     Status: None   Collection Time: 10/16/22  6:10 PM  Result Value Ref Range   Ammonia 15 9 - 35 umol/L    Comment: Performed at [facility], [address], [place], [place] [address]  Protime-INR     Status: Abnormal   Collection Time: 10/16/22  6:10 PM  Result Value Ref Range   Prothrombin Time 25.9 (H) 11.4 - 15.2 seconds   INR 2.4 (H) 0.8 - 1.2    Comment: (NOTE) INR goal varies based on device and disease states. Performed at [facility], [address], [place], [place] [address]   CBG monitoring, ED     Status: Abnormal   Collection Time: 10/16/22  7:58 PM  Result Value Ref Range   Glucose-Capillary 135 (H) 70 - 99 mg/dL    Comment: Glucose reference range applies only to samples taken after fasting for at least 8 hours.  Lactic acid, plasma     Status: Abnormal   Collection Time: 10/16/22  8:10 PM  Result Value Ref Range   Lactic Acid, Venous 2.2 (HH)  0.5 - 1.9 mmol/L    Comment: CRITICAL RESULT CALLED TO, READ BACK BY AND VERIFIED WITH Yatziry Deakins,G ON 10/16/22 AT 2055 BY LOY,C Performed at [facility], [address] [place], [place] [address]   Culture, blood (routine x 2)     Status: None (Preliminary result)   Collection Time: 10/16/22  9:13 PM   Specimen: BLOOD  Result Value Ref Range   Specimen Description BLOOD BLOOD RIGHT HAND    Special Requests      BOTTLES DRAWN AEROBIC AND ANAEROBIC Blood Culture adequate volume Performed at [facility], [address] [place], [place] [address]    Culture PENDING    Report Status PENDING   Culture, blood (routine x 2)     Status: None (Preliminary result)   Collection Time: 10/16/22  9:13 PM   Specimen: BLOOD  Result Value Ref Range   Specimen Description BLOOD LEFT ANTECUBITAL    Special Requests      BOTTLES DRAWN AEROBIC AND ANAEROBIC Blood Culture results may not be optimal due to an excessive volume of blood received in culture bottles Performed at [facility], [address] [place], [place] [address]    Culture PENDING    Report Status PENDING   Lactic acid, plasma     Status: Abnormal   Collection Time: 10/16/22  9:17 PM  Result Value Ref Range   Lactic Acid, Venous 2.6 (HH) 0.5 - 1.9 mmol/L    Comment: CRITICAL RESULT CALLED TO, READ BACK BY AND VERIFIED WITH MELISSA RAGLAND AT 2216 10/16/2022 BY Havery Moros Performed at [facility], [address], [place], [place] [address]   Urinalysis, w/ Reflex to Culture (Infection Suspected) -Urine, Unspecified Source     Status: Abnormal   Collection Time: 10/16/22  9:26 PM  Result Value Ref Range   Specimen Source URINE, UNSPE    Color,  Urine YELLOW YELLOW   APPearance CLOUDY (A) CLEAR   Specific Gravity, Urine 1.009 1.005 - 1.030   pH 6.0 5.0 - 8.0   Glucose, UA NEGATIVE NEGATIVE mg/dL   Hgb urine dipstick LARGE (A) NEGATIVE   Bilirubin Urine NEGATIVE NEGATIVE   Ketones, ur NEGATIVE NEGATIVE mg/dL   Protein, ur 425 (A)  NEGATIVE mg/dL   Nitrite NEGATIVE NEGATIVE   Leukocytes,Ua LARGE (A) NEGATIVE   RBC / HPF 21-50 0 - 5 RBC/hpf   WBC, UA >50 0 - 5 WBC/hpf    Comment:        Reflex urine culture not performed if WBC <=10, OR if Squamous epithelial cells >5. If Squamous epithelial cells >5 suggest recollection.    Bacteria, UA MANY (A) NONE SEEN   Squamous Epithelial / HPF 0-5 0 - 5 /HPF   WBC Clumps PRESENT     Comment: Performed at [facility], [address], [place], [place] [address]  Comprehensive metabolic panel     Status: Abnormal   Collection Time: 10/17/22  3:33 AM  Result Value Ref Range   Sodium 130 (L) 135 - 145 mmol/L   Potassium 4.1 3.5 - 5.1 mmol/L   Chloride 98 98 - 111 mmol/L   CO2 21 (L) 22 - 32 mmol/L   Glucose, Bld 163 (H) 70 - 99 mg/dL    Comment: Glucose reference range applies only to samples taken after fasting for at least 8 hours.   BUN 55 (H) 8 - 23 mg/dL   Creatinine, Ser 7.56 (H) 0.44 - 1.00 mg/dL   Calcium 8.8 (L) 8.9 - 10.3 mg/dL   Total Protein 6.1 (L) 6.5 - 8.1 g/dL   Albumin 2.9 (L) 3.5 - 5.0 g/dL   AST 56 (H) 15 - 41 U/L   ALT 33 0 - 44 U/L   Alkaline Phosphatase 74 38 - 126 U/L   Total Bilirubin 1.3 (H) 0.3 - 1.2 mg/dL   GFR, Estimated 24 (L) >60 mL/min    Comment: (NOTE) Calculated using the CKD-EPI Creatinine Equation (2021)    Anion gap 11 5 - 15    Comment: Performed at [facility] [address], [place], [place] [address]  Magnesium     Status: None   Collection Time: 10/17/22  3:33 AM  Result Value Ref Range   Magnesium 2.1 1.7 - 2.4 mg/dL    Comment: Performed at [facility], [address], [place], [place] [address]  CBC with Differential/Platelet     Status: Abnormal   Collection Time: 10/17/22  3:33 AM  Result Value Ref Range   WBC 20.8 (H) 4.0 - 10.5 K/uL   RBC 3.50 (L) 3.87 - 5.11 MIL/uL   Hemoglobin 10.6 (L) 12.0 - 15.0 g/dL   HCT 16.6 (L) 06.3 - 01.6 %   MCV 88.9 80.0 - 100.0 fL   MCH 30.3 26.0 - 34.0 pg   MCHC 34.1 30.0 - 36.0  g/dL   RDW 01.0 93.2 - 35.5 %   Platelets 101 (L) 150 - 400 K/uL   nRBC 0.0 0.0 - 0.2 %   Neutrophils Relative % 79 %   Neutro Abs 16.4 (H) 1.7 - 7.7 K/uL   Lymphocytes Relative 4 %   Lymphs Abs 0.9 0.7 - 4.0 K/uL   Monocytes Relative 11 %   Monocytes Absolute 2.4 (H) 0.1 - 1.0 K/uL   Eosinophils Relative 1 %   Eosinophils Absolute 0.2 0.0 - 0.5 K/uL   Basophils Relative 0 %  Basophils Absolute 0.0 0.0 - 0.1 K/uL   Immature Granulocytes 5 %   Abs Immature Granulocytes 0.93 (H) 0.00 - 0.07 K/uL    Comment: Performed at [facility], [address] [place], [place] [address]  TSH     Status: None   Collection Time: 10/17/22  3:33 AM  Result Value Ref Range   TSH 1.023 0.350 - 4.500 uIU/mL    Comment: Performed by a 3rd Generation assay with a functional sensitivity of <=0.01 uIU/mL. Performed at [facility], [address], [place], [place] [address]   Lactic acid, plasma     Status: None   Collection Time: 10/17/22  6:25 AM  Result Value Ref Range   Lactic Acid, Venous 1.5 0.5 - 1.9 mmol/L    Comment: Performed at [facility], [address] [place], [place] [address]   DG Chest Portable 1 View  Result Date: 10/16/2022 CLINICAL DATA:  Recent fall with altered mental status, initial encounter EXAM: PORTABLE CHEST 1 VIEW COMPARISON:  08/11/2022 FINDINGS: Cardiac shadow is enlarged but stable. Pacing device is again seen. The lungs are well aerated bilaterally. No focal infiltrate or effusion is seen. No bony abnormality is noted. Mild central vascular congestion is noted. IMPRESSION: Mild CHF without significant edema. Electronically Signed   By: Alcide Clever M.D.   On: 10/16/2022 20:49   CT Head Wo Contrast  Result Date: 10/16/2022 CLINICAL DATA:  Head trauma, moderate severe. EXAM: CT HEAD WITHOUT CONTRAST TECHNIQUE: Contiguous axial images were obtained from the base of the skull through the vertex without intravenous contrast. RADIATION DOSE REDUCTION: This exam was performed  according to the departmental dose-optimization program which includes automated exposure control, adjustment of the mA and/or kV according to patient size and/or use of iterative reconstruction technique. COMPARISON:  Head CT 06/16/2017.  MRI brain 05/25/2017. FINDINGS: Brain: No evidence of acute infarction, hemorrhage, hydrocephalus, extra-axial collection or mass lesion/mass effect. There is stable mild periventricular white matter hypodensity, likely chronic small vessel ischemic change. Vascular: No hyperdense vessel or unexpected calcification. Skull: Normal. Negative for fracture or focal lesion. Sinuses/Orbits: No acute finding. Other: None. IMPRESSION: No acute intracranial process. Stable mild chronic small vessel ischemic change. Electronically Signed   By: Darliss Cheney M.D.   On: 10/16/2022 20:37    Pending Labs Unresulted Labs (From admission, onward)     Start     Ordered   10/16/22 2316  HIV Antibody (routine testing w rflx)  (HIV Antibody (Routine testing w reflex) panel)  Once,   R        10/16/22 2315   10/16/22 2126  Urine Culture  Once,   R        10/16/22 2126            Vitals/Pain Today's Vitals   10/17/22 0019 10/17/22 0229 10/17/22 0449 10/17/22 0655  BP: (!) 99/56  109/65 102/73  Pulse: 86  79 90  Resp: 16  18 18   Temp:  98.4 F (36.9 C) 98.3 F (36.8 C) 99.2 F (37.3 C)  TempSrc:  Oral Oral Oral  SpO2: 94%  97% 100%  PainSc:        Isolation Precautions No active isolations  Medications Medications  cefTRIAXone (ROCEPHIN) 1 g in sodium chloride 0.9 % 100 mL IVPB (has no administration in time range)  amiodarone (PACERONE) tablet 200 mg (has no administration in time range)  metoprolol tartrate (LOPRESSOR) tablet 12.5 mg (12.5 mg Oral Not Given 10/17/22 0019)  sacubitril-valsartan (ENTRESTO)  24-26 mg per tablet (has no administration in time range)  citalopram (CELEXA) tablet 10 mg (has no administration in time range)  traZODone (DESYREL) tablet  50 mg (has no administration in time range)  lactulose (CHRONULAC) 10 GM/15ML solution 20 g (20 g Oral Given 10/17/22 0021)  pantoprazole (PROTONIX) EC tablet 40 mg (40 mg Oral Given 10/17/22 0021)  warfarin (COUMADIN) tablet 5 mg (has no administration in time range)  gabapentin (NEURONTIN) capsule 300 mg (has no administration in time range)  lamoTRIgine (LAMICTAL) tablet 200 mg (200 mg Oral Given 10/17/22 0021)  acetaminophen (TYLENOL) tablet 650 mg (has no administration in time range)    Or  acetaminophen (TYLENOL) suppository 650 mg (has no administration in time range)  oxyCODONE (Oxy IR/ROXICODONE) immediate release tablet 5 mg (has no administration in time range)  ondansetron (ZOFRAN) tablet 4 mg (has no administration in time range)    Or  ondansetron (ZOFRAN) injection 4 mg (has no administration in time range)  insulin aspart (novoLOG) injection 0-15 Units (has no administration in time range)  insulin aspart (novoLOG) injection 0-5 Units (has no administration in time range)  sodium chloride 0.9 % bolus 500 mL (0 mLs Intravenous Stopped 10/16/22 2101)  cefTRIAXone (ROCEPHIN) 2 g in sodium chloride 0.9 % 100 mL IVPB (0 g Intravenous Stopped 10/17/22 0020)    Mobility manual wheelchair     Focused Assessments Neuro Assessment Handoff:       Neuro Assessment: Exceptions to WDL Neuro Checks:        R Recommendations: See Admitting Provider Note  Report given to:   Additional Notes:

## 2022-10-17 NOTE — Progress Notes (Signed)
ANTICOAGULATION CONSULT NOTE - Initial Consult  Pharmacy Consult for Warfarin Indication: atrial fibrillation  No Known Allergies  Patient Measurements: Height: 5\' 4"  (162.6 cm) Weight: 68.4 kg (150 lb 12.7 oz) IBW/kg (Calculated) : 54.7 Heparin Dosing Weight:   Vital Signs: Temp: 98.4 F (36.9 C) (08/29 0744) Temp Source: Oral (08/29 0744) BP: 107/56 (08/29 0744) Pulse Rate: 90 (08/29 0655)  Labs: Recent Labs    10/16/22 1810 10/17/22 0333  HGB 11.6* 10.6*  HCT 34.5* 31.1*  PLT 113* 101*  LABPROT 25.9*  --   INR 2.4*  --   CREATININE 2.57* 2.17*    Estimated Creatinine Clearance: 22.9 mL/min (A) (by C-G formula based on SCr of 2.17 mg/dL (H)).   Medical History: Past Medical History:  Diagnosis Date   Asthma    AVNRT (AV nodal re-entry tachycardia)    Carotid artery disease (HCC)    Cirrhosis (HCC)    Diverticulosis of colon (without mention of hemorrhage)    Essential hypertension    Functional ovarian cysts    GERD (gastroesophageal reflux disease)    Hemorrhoids, external, without mention of complication    Hormone replacement therapy (postmenopausal)    IBS (irritable bowel syndrome)    Migraine    Mitral valve disease    Pacemaker    Complete heart block - [employer]   Right knee DJD    Seizures (HCC)    Sleep apnea    TIA (transient ischemic attack)    Type 2 diabetes mellitus (HCC)     Medications:  Medications Prior to Admission  Medication Sig Dispense Refill Last Dose   Accu-Chek Softclix Lancets lancets check blood sugars twice daily DX E11.65 200 each 3    acetaminophen (TYLENOL) 500 MG tablet Take 500 mg by mouth daily as needed for mild pain.      amiodarone (PACERONE) 200 MG tablet Take 2 tablets (400 mg total) by mouth 2 (two) times daily for 9 days, THEN 1 tablet (200 mg total) daily. 126 tablet 0    Azelastine HCl 0.15 % SOLN  (Patient not taking: Reported on 10/14/2022)      Blood Pressure Monitoring (OMRON 3 SERIES BP MONITOR)  DEVI Use as directed 1 each 0    cetirizine (ZYRTEC ALLERGY) 10 MG tablet Take 1 tablet (10 mg total) by mouth daily. 90 tablet 1    citalopram (CELEXA) 10 MG tablet Take 1 tablet (10 mg total) by mouth daily. 30 tablet 2    fluticasone (FLONASE) 50 MCG/ACT nasal spray Place 2 sprays into both nostrils daily. (Patient taking differently: Place 2 sprays into both nostrils daily as needed for allergies.) 16 g 6    furosemide (LASIX) 40 MG tablet Take 40 mg by mouth daily.      gabapentin (NEURONTIN) 300 MG capsule Take 300 mg by mouth 3 (three) times daily as needed (neuropathy).      glucose blood (ACCU-CHEK GUIDE) test strip USE AS INSTRUCTED TO TEST BLOOD SUGAR DAILY AS DIRECTED. 50 each 11    lactulose (CHRONULAC) 10 GM/15ML solution Take 30 mLs (20 g total) by mouth 3 (three) times daily. 16200 mL 3    lamoTRIgine (LAMICTAL) 200 MG tablet TAKE (1) TABLET TWICE DAILY. (Patient taking differently: Take 200 mg by mouth 2 (two) times daily.) 180 tablet 3    LORazepam (ATIVAN) 0.5 MG tablet Take 1 tablet (0.5 mg total) by mouth 2 (two) times daily as needed for anxiety. 60 tablet 5    magic mouthwash (lidocaine,  diphenhydrAMINE, alum & mag hydroxide) suspension Swish and swallow 5 mLs 3 (three) times daily as needed for mouth pain. 360 mL 3    metFORMIN (GLUCOPHAGE) 500 MG tablet Take 1 tablet (500 mg total) by mouth 2 (two) times daily with a meal. 120 tablet 3    metoprolol tartrate (LOPRESSOR) 25 MG tablet Take 0.5 tablets (12.5 mg total) by mouth 2 (two) times daily. 30 tablet 3    montelukast (SINGULAIR) 10 MG tablet Take 1 tablet (10 mg total) by mouth at bedtime. 30 tablet 3    Multiple Vitamins-Minerals (MULTIVITAMIN WOMEN 50+) TABS Take 1 tablet by mouth daily.      ondansetron (ZOFRAN) 4 MG tablet take 1 tablet by mouth every 8 hours as needed for nausea and vomiting. 60 tablet 0    pantoprazole (PROTONIX) 40 MG tablet TAKE (1) TABLET TWICE DAILY. (Patient taking differently: Take 40 mg by  mouth 2 (two) times daily.) 60 tablet 5    potassium chloride SA (KLOR-CON M) 20 MEQ tablet Take 20 mEq by mouth 2 (two) times daily.      sacubitril-valsartan (ENTRESTO) 24-26 MG Take 1 tablet by mouth 2 (two) times daily. 180 tablet 3    traMADol (ULTRAM) 50 MG tablet Take 50 mg by mouth every 6 (six) hours as needed.      traZODone (DESYREL) 50 MG tablet Take 1 tablet (50 mg total) by mouth at bedtime as needed for sleep. 90 tablet 3    warfarin (COUMADIN) 5 MG tablet Take 1 tablet (5 mg total) by mouth daily. Or as directed by [facility]. 45 tablet 1     Assessment: Patient presented to ED due to confusion and falls, with a PMH significant of T2DM, TIA, seizure, GERD and NASH cirrhosis. CT head shows no acute intracranial process.   AKI with elevated Scr 2.57 > 2.17. Lowered hemoglobin 11.6 > 10.6 and platelet 113 > 101. INR 2.4 (therapeutic) at 8/28 PM. Not bleeding documented. Warfarin home dose 5 mg daily except 2.5 mg on Mondays, Wednesdays and Fridays.   Goal of Therapy:  INR 2-3 Monitor platelets by anticoagulation protocol: Yes   Plan:  Warfarin 5 mg x1 dose. Check INR daily.  Tory Emerald, PharmD Candidate 10/17/2022,9:45 AM

## 2022-10-17 NOTE — Progress Notes (Signed)
PHARMACY - PHYSICIAN COMMUNICATION CRITICAL VALUE ALERT - BLOOD CULTURE IDENTIFICATION (BCID)  JAIELLE SAUBER is an 72 y.o. female who presented to [facility] on 10/16/2022   Assessment:  e. Coli bacteremia with no resistance   Current antibiotics: Ceftriaxone 2000 mg IV every 24 hours.  Changes to prescribed antibiotics recommended:  Patient is on recommended antibiotics - No changes needed  No results found for this or any previous visit.  Tad Moore 10/17/2022  2:04 PM

## 2022-10-17 NOTE — Assessment & Plan Note (Signed)
-   Continue warfarin and amiodarone

## 2022-10-17 NOTE — Assessment & Plan Note (Signed)
Sliding scale coverage 

## 2022-10-17 NOTE — Progress Notes (Signed)
Date and time results received: 10/17/22 0939 (use smartphrase ".now" to insert current time)  Test: Blood cultures Critical Value: 2 anaerobic & 1 aerobic bottle gram negative rods  Name of Provider Notified: MD [place]  Orders Received? Or Actions Taken?: No new orders received at this time.

## 2022-10-17 NOTE — Assessment & Plan Note (Signed)
-   Confusion and falls at home - Likely due to UTI - Level is normal - Urine culture pending - Head CT shows no acute intracranial process -

## 2022-10-17 NOTE — Assessment & Plan Note (Signed)
Ammonia level normal

## 2022-10-17 NOTE — TOC Initial Note (Signed)
Transition of Care [facility]) - Initial/Assessment Note    Patient Details  Name: Carol Richmond MRN: [id number] Date of Birth: Aug 14, 1950  Transition of [facility]) CM/SW Contact:    Carol Gault, LCSW Phone Number: 10/17/2022, 11:26 AM  Clinical Narrative:                  Pt admitted from home. She is independent in ADLs at home. Pt has PCP, Carol Richmond, she has insurance on file. She does not currently have any HH services in place. She has a cane, and two walkers at home. She states her daughter, Carol Batten or Carol Richmond will transport her home at dc. They are both her support system. She gets her medications from [facility] in [place].   TOC will follow and assist if needs arise.   Expected Discharge Plan: Home/Self Care Barriers to Discharge: Continued Medical Work up   Patient Goals and CMS Choice Patient states their goals for this hospitalization and ongoing recovery are:: go home          Expected Discharge Plan and Services In-house Referral: Clinical Social Work     Living arrangements for the past 2 months: Single Family Home                                      Prior Living Arrangements/Services Living arrangements for the past 2 months: Single Family Home Lives with:: Self Patient language and need for interpreter reviewed:: Yes Do you feel safe going back to the place where you live?: Yes      Need for Family Participation in Patient Care: No (Comment) Care giver support system in place?: Yes (comment) Current home services: DME    Activities of Daily Living Home Assistive Devices/Equipment: CBG Meter, Walker (specify type) ADL Screening (condition at time of admission) Patient's cognitive ability adequate to safely complete daily activities?: Yes Is the patient deaf or have difficulty hearing?: No Does the patient have difficulty seeing, even when wearing glasses/contacts?: No Does the patient have difficulty concentrating, remembering, or  making decisions?: No Patient able to express need for assistance with ADLs?: Yes Does the patient have difficulty dressing or bathing?: No Independently performs ADLs?: Yes (appropriate for developmental age) Does the patient have difficulty walking or climbing stairs?: No Weakness of Legs: None Weakness of Arms/Hands: None  Permission Sought/Granted                  Emotional Assessment       Orientation: : Oriented to Self, Oriented to Place, Oriented to  Time, Oriented to Situation Alcohol / Substance Use: Not Applicable Psych Involvement: No (comment)  Admission diagnosis:  NASH (nonalcoholic steatohepatitis) [K75.81] Acute cystitis without hematuria [N30.00] AKI (acute kidney injury) (HCC) [N17.9] Patient Active Problem List   Diagnosis Date Noted   UTI (urinary tract infection) 10/17/2022   AKI (acute kidney injury) (HCC) 10/16/2022   Esophageal varices (HCC) 09/30/2022   Encounter for therapeutic drug monitoring 08/20/2022   Cold sore 08/12/2022   Atrial fibrillation (HCC) 08/11/2022   Atrial flutter (HCC) 08/11/2022   Encounter to discuss test results 08/07/2022   Hyperlipidemia associated with type 2 diabetes mellitus (HCC) 07/22/2022   Lymphadenopathy 07/10/2022   Burning tongue syndrome 03/28/2022   S/P placement of cardiac pacemaker 12/28/2021   Heart block AV complete (HCC) 12/15/2021   LBBB (left bundle branch block) 12/15/2021   Controlled substance agreement  signed 12/03/2021   Mitral stenosis with regurgitation 09/30/2021   Wide-complex tachycardia 09/30/2021   SVT (supraventricular tachycardia) 09/28/2021   Chronic HFrEF (heart failure with reduced ejection fraction) (HCC) 09/28/2021   Moderate nonproliferative diabetic retinopathy of left eye, associated with type 2 diabetes mellitus (HCC) 09/20/2021   Posterior vitreous detachment of left eye 09/20/2021   Stable central retinal vein occlusion of right eye 09/20/2021   Hypertensive retinopathy of  both eyes, grade 1 09/20/2021   Nuclear sclerotic cataract of left eye 09/20/2021   Benzodiazepine dependence (HCC) 05/15/2021   Angina, class III (HCC) 06/27/2020   Cirrhosis, nonalcoholic (HCC) 03/28/2020   Acute metabolic encephalopathy 03/28/2020   Cervical spondylosis 06/07/2019   Peptic ulcer disease 01/25/2019   Iron deficiency anemia 01/25/2019   Fatty liver disease, nonalcoholic 01/25/2019   Cardiomyopathy (HCC) 08/07/2017   Non-rheumatic mitral regurgitation 08/07/2017   Non-rheumatic tricuspid valve insufficiency 08/07/2017   PAD (peripheral artery disease) (HCC) 08/07/2017   Type 2 diabetes mellitus with hyperglycemia, without long-term current use of insulin (HCC) 08/07/2017   Carotid artery stenosis, symptomatic, right 07/01/2017   Stenosis of right carotid artery 06/23/2017   Orthostatic tremor 06/23/2017   Generalized anxiety disorder 07/01/2016   Mild neurocognitive disorder 05/02/2016   Migraine without aura and without status migrainosus, not intractable 01/27/2015   Depression, recurrent (HCC) 07/25/2014   Chronic daily headache 12/03/2013   Osteoarthritis of right knee 09/03/2012   Right knee DJD    IBS (irritable bowel syndrome)    Diverticulosis of colon (without mention of hemorrhage)    GERD (gastroesophageal reflux disease)    Hypertension associated with type 2 diabetes mellitus (HCC)    Partial seizure (HCC)    Hemorrhoids, external, without mention of complication    PCP:  Carol Earing, FNP Pharmacy:   [facility] - [place], [place] - [address] [address] Phone: [phone] Fax: [phone]     Social Determinants of Health (SDOH) Social History: SDOH Screenings   Food Insecurity: No Food Insecurity (10/17/2022)  Housing: Low Risk  (10/17/2022)  Transportation Needs: No Transportation Needs (10/17/2022)  Utilities: Not At Risk (10/17/2022)  Alcohol Screen: Low Risk  (09/25/2022)  Depression  (PHQ2-9): Low Risk  (09/25/2022)  Financial Resource Strain: Low Risk  (09/25/2022)  Physical Activity: Insufficiently Active (09/25/2022)  Social Connections: Socially Integrated (09/25/2022)  Stress: No Stress Concern Present (09/25/2022)  Tobacco Use: Low Risk  (10/17/2022)  Health Literacy: Adequate Health Literacy (09/25/2022)   SDOH Interventions:     Readmission Risk Interventions    10/17/2022   11:24 AM  Readmission Risk Prevention Plan  Transportation Screening Complete  Medication Review (RN Care Manager) Complete  HRI or Home Care Consult Complete  SW Recovery Care/Counseling Consult Complete  Palliative Care Screening Not Applicable  Skilled Nursing Facility Not Applicable

## 2022-10-17 NOTE — Assessment & Plan Note (Signed)
Severe sepsis, present on admission.   Urine culture positive for Klebsiella, sensitive to cephalosporins.  Change to cephalexin 500 mg tid.

## 2022-10-17 NOTE — H&P (Signed)
History and Physical    Patient: Carol Richmond:[id number] DOB: [birth year] DOA: 10/16/2022 DOS: the patient was seen and examined on 10/17/2022 PCP: Gabriel Earing, FNP  Patient coming from: Home  Chief Complaint:  Chief Complaint  Patient presents with   Fall   Altered Mental Status   HPI: Carol Richmond is a 72 y.o. female with medical history significant of type 2 diabetes mellitus, TIA, seizure, GERD, nonalcoholic cirrhosis, GERD, and more presents the ED with a chief complaint of confusion and falls.  Unfortunately due to confusion, patient is not able to recall very much of it.  She thinks she hit her head.  Takes asking her several times where she hit her head to get her to actually point on her head, before that she is just trying to say they did x-rays they did x-rays.  She thinks maybe she lost her balance.  She thinks maybe she tripped on something.  She hit her head on the right parietal region per her report.  She had no loss of consciousness.  She is on Coumadin for A-fib.  She has no other complaints at this time. Review of Systems: As mentioned in the history of present illness. All other systems reviewed and are negative. Past Medical History:  Diagnosis Date   Asthma    AVNRT (AV nodal re-entry tachycardia)    Carotid artery disease (HCC)    Cirrhosis (HCC)    Diverticulosis of colon (without mention of hemorrhage)    Essential hypertension    Functional ovarian cysts    GERD (gastroesophageal reflux disease)    Hemorrhoids, external, without mention of complication    Hormone replacement therapy (postmenopausal)    IBS (irritable bowel syndrome)    Migraine    Mitral valve disease    Pacemaker    Complete heart block - Medtronic   Right knee DJD    Seizures (HCC)    Sleep apnea    TIA (transient ischemic attack)    Type 2 diabetes mellitus (HCC)    Past Surgical History:  Procedure Laterality Date   BIOPSY  09/23/2018   Procedure: BIOPSY;   Surgeon: Malissa Hippo, MD;  Location: [facility];  Service: Endoscopy;;  duodeneum and stomach   CARDIOVERSION N/A 08/14/2022   Procedure: CARDIOVERSION;  Surgeon: [name], MD;  Location: [facility] INVASIVE CV LAB;  Service: Cardiovascular;  Laterality: N/A;   CHOLECYSTECTOMY  1980   ENDARTERECTOMY Right 07/01/2017   Procedure: ENDARTERECTOMY CAROTID RIGHT;  Surgeon: Nada Libman, MD;  Location: [facility] OR;  Service: Vascular;  Laterality: Right;   ESOPHAGOGASTRODUODENOSCOPY N/A 09/23/2018   Procedure: ESOPHAGOGASTRODUODENOSCOPY (EGD);  Surgeon: Malissa Hippo, MD;  Location: [facility];  Service: Endoscopy;  Laterality: N/A;  2:45-moved to 1:45 per Dewayne Hatch   ESOPHAGOGASTRODUODENOSCOPY (EGD) WITH PROPOFOL N/A 03/07/2021   Procedure: ESOPHAGOGASTRODUODENOSCOPY (EGD) WITH PROPOFOL;  Surgeon: Malissa Hippo, MD;  Location: [facility];  Service: Endoscopy;  Laterality: N/A;  12:50   LEFT HEART CATHETERIZATION WITH CORONARY ANGIOGRAM N/A 11/18/2013   Procedure: LEFT HEART CATHETERIZATION WITH CORONARY ANGIOGRAM;  Surgeon: Kathleene Hazel, MD;  Location: [facility] CATH LAB;  Service: Cardiovascular;  Laterality: N/A;   OOPHORECTOMY     2012 Dr. Donzetta Matters in [place]   PACEMAKER IMPLANT N/A 12/14/2021   Procedure: PACEMAKER IMPLANT;  Surgeon: Marinus Maw, MD;  Location: [facility] INVASIVE CV LAB;  Service: Cardiovascular;  Laterality: N/A;   PATCH ANGIOPLASTY Right 07/01/2017   Procedure: RIGHT  CAROTID PATCH ANGIOPLASTY;  Surgeon: Nada Libman, MD;  Location: [facility] OR;  Service: Vascular;  Laterality: Right;   SVT ABLATION N/A 12/14/2021   Procedure: SVT ABLATION;  Surgeon: Marinus Maw, MD;  Location: [facility] CV LAB;  Service: Cardiovascular;  Laterality: N/A;   TEE WITHOUT CARDIOVERSION N/A 03/01/2022   Procedure: TRANSESOPHAGEAL ECHOCARDIOGRAM (TEE);  Surgeon: Thurmon Fair, MD;  Location: [facility] ENDOSCOPY;  Service: Cardiovascular;  Laterality: N/A;   TEE WITHOUT CARDIOVERSION N/A 08/14/2022    Procedure: TRANSESOPHAGEAL ECHOCARDIOGRAM;  Surgeon: [name], MD;  Location: [facility] INVASIVE CV LAB;  Service: Cardiovascular;  Laterality: N/A;   TONSILLECTOMY     TOTAL KNEE ARTHROPLASTY Right 09/02/2012   Procedure: TOTAL KNEE ARTHROPLASTY- right;  Surgeon: Loreta Ave, MD;  Location: [facility] OR;  Service: Orthopedics;  Laterality: Right;   TUBAL LIGATION     TYMPANOMASTOIDECTOMY Left 04/02/2016   Procedure: LEFT CANAL WALL DOWN TYMPANOMASTOIDECTOMY;  Surgeon: Newman Pies, MD;  Location: [facility];  Service: ENT;  Laterality: Left;   VAGINAL HYSTERECTOMY  1990   WOUND EXPLORATION Right 07/02/2017   Procedure: RIGHT NECK EXPLORATION WITH INTRAOPERATIVE ULTRASOUND;  Surgeon: Chuck Hint, MD;  Location: [facility] OR;  Service: Vascular;  Laterality: Right;   Social History:  reports that she has never smoked. She has never used smokeless tobacco. She reports that she does not drink alcohol and does not use drugs.  No Known Allergies  Family History  Problem Relation Age of Onset   Heart disease Mother    Diabetes Mother    Heart disease Brother    Cancer Sister    Heart disease Brother    Heart disease Brother    Heart disease Son     Prior to Admission medications   Medication Sig Start Date End Date Taking? Authorizing Provider  Accu-Chek Softclix Lancets lancets check blood sugars twice daily DX E11.65 11/28/21   Gabriel Earing, FNP  acetaminophen (TYLENOL) 500 MG tablet Take 500 mg by mouth daily as needed for mild pain.    [provider]  amiodarone (PACERONE) 200 MG tablet Take 2 tablets (400 mg total) by mouth 2 (two) times daily for 9 days, THEN 1 tablet (200 mg total) daily. 08/15/22 11/22/22  Perlie Gold, PA-C  Azelastine HCl 0.15 % SOLN     [provider]  Blood Pressure Monitoring (OMRON 3 SERIES BP MONITOR) DEVI Use as directed 08/09/22   Sharlene Dory, NP  cetirizine (ZYRTEC ALLERGY) 10 MG tablet Take 1 tablet (10 mg total) by  mouth daily. 07/30/22   Junie Spencer, FNP  citalopram (CELEXA) 10 MG tablet Take 1 tablet (10 mg total) by mouth daily. 08/28/22   Milian, Aleen Campi, FNP  fluticasone (FLONASE) 50 MCG/ACT nasal spray Place 2 sprays into both nostrils daily. Patient taking differently: Place 2 sprays into both nostrils daily as needed for allergies. 07/30/22   Junie Spencer, FNP  furosemide (LASIX) 40 MG tablet Take 40 mg by mouth daily. 08/20/22   [provider]  gabapentin (NEURONTIN) 300 MG capsule Take 300 mg by mouth 3 (three) times daily as needed (neuropathy).    [provider]  glucose blood (ACCU-CHEK GUIDE) test strip USE AS INSTRUCTED TO TEST BLOOD SUGAR DAILY AS DIRECTED. 09/10/22   Gabriel Earing, FNP  lactulose (CHRONULAC) 10 GM/15ML solution Take 30 mLs (20 g total) by mouth 3 (three) times daily. 09/30/22   Dolores Frame, MD  lamoTRIgine (LAMICTAL) 200  MG tablet TAKE (1) TABLET TWICE DAILY. Patient taking differently: Take 200 mg by mouth 2 (two) times daily. 01/21/22   Gabriel Earing, FNP  LORazepam (ATIVAN) 0.5 MG tablet Take 1 tablet (0.5 mg total) by mouth 2 (two) times daily as needed for anxiety. 07/22/22   Gabriel Earing, FNP  magic mouthwash (lidocaine, diphenhydrAMINE, alum & mag hydroxide) suspension Swish and swallow 5 mLs 3 (three) times daily as needed for mouth pain. 09/30/22   Dolores Frame, MD  metFORMIN (GLUCOPHAGE) 500 MG tablet Take 1 tablet (500 mg total) by mouth 2 (two) times daily with a meal. 07/22/22   Gabriel Earing, FNP  metoprolol tartrate (LOPRESSOR) 25 MG tablet Take 0.5 tablets (12.5 mg total) by mouth 2 (two) times daily. 08/15/22   Perlie Gold, PA-C  montelukast (SINGULAIR) 10 MG tablet Take 1 tablet (10 mg total) by mouth at bedtime. 09/20/22   Gabriel Earing, FNP  Multiple Vitamins-Minerals (MULTIVITAMIN WOMEN 50+) TABS Take 1 tablet by mouth daily.    [provider]  ondansetron (ZOFRAN) 4 MG tablet  take 1 tablet by mouth every 8 hours as needed for nausea and vomiting. 10/08/22   Marguerita Merles, Reuel Boom, MD  pantoprazole (PROTONIX) 40 MG tablet TAKE (1) TABLET TWICE DAILY. Patient taking differently: Take 40 mg by mouth 2 (two) times daily. 06/10/22   Carlan, Chelsea L, NP  potassium chloride SA (KLOR-CON M) 20 MEQ tablet Take 20 mEq by mouth 2 (two) times daily. 09/03/22   [provider]  sacubitril-valsartan (ENTRESTO) 24-26 MG Take 1 tablet by mouth 2 (two) times daily. 02/04/22   Jonelle Sidle, MD  traMADol (ULTRAM) 50 MG tablet Take 50 mg by mouth every 6 (six) hours as needed.    [provider]  traZODone (DESYREL) 50 MG tablet Take 1 tablet (50 mg total) by mouth at bedtime as needed for sleep. 07/22/22   Gabriel Earing, FNP  warfarin (COUMADIN) 5 MG tablet Take 1 tablet (5 mg total) by mouth daily. Or as directed by [facility]. 08/16/22 08/16/23  Perlie Gold, PA-C    Physical Exam: Vitals:   10/16/22 2203 10/17/22 0019 10/17/22 0229 10/17/22 0449  BP:  (!) 99/56  109/65  Pulse: 88 86  79  Resp: 19 16  18   Temp:   98.4 F (36.9 C) 98.3 F (36.8 C)  TempSrc:   Oral Oral  SpO2: 92% 94%  97%   1.  General: Patient lying supine in bed,  no acute distress   2. Psychiatric: Alert and oriented x 3, mood and behavior normal for situation, pleasant and cooperative with exam   3. Neurologic: Speech and language are normal, face is symmetric, moves all 4 extremities voluntarily, at baseline without acute deficits on limited exam   4. HEENMT:  Hard of hearing head is atraumatic, normocephalic, pupils reactive to light, neck is supple, trachea is midline, mucous membranes are moist   5. Respiratory : Lungs are clear to auscultation bilaterally without wheezing, rhonchi, rales, no cyanosis, no increase in work of breathing or accessory muscle use, O2 in place   6. Cardiovascular : Heart rate normal, rhythm is regular, no murmurs, rubs or gallops, no  peripheral edema, peripheral pulses palpated   7. Gastrointestinal:  Abdomen is soft, nondistended, nontender to palpation bowel sounds active, no masses or organomegaly palpated   8. Skin:  Skin is warm, dry and intact without rashes, acute lesions, or ulcers on limited exam  9.Musculoskeletal:  No acute deformities or trauma, no asymmetry in tone, no peripheral edema, peripheral pulses palpated, no tenderness to palpation in the extremities  Data Reviewed: Temp 99.2-99.8, heart rate 78-91, respiratory rate 18-29, blood pressure 101/45-128/64, satting 87-94% Lactic acid 2.2 and then 2.6 Leukocytosis of 28.6, hemoglobin 11.6 CT head shows no acute intracranial process EKG shows a heart rate of 80, sinus rhythm, QTc 490 Patient was started on Rocephin and given a fluid bolus  Assessment and Plan: * AKI (acute kidney injury) (HCC) - Creatinine bumped from 0.89>> 2.57 - 500 mL bolus given in the ED - Encourage p.o. intake - Trend in the a.m.  UTI (urinary tract infection) - Continue Rocephin - Urine culture pending  Atrial fibrillation (HCC) - Continue warfarin and amiodarone  Type 2 diabetes mellitus with hyperglycemia, without long-term current use of insulin (HCC) - Sliding scale coverage  Acute metabolic encephalopathy - Confusion and falls at home - Likely due to UTI - Level is normal - Urine culture pending - Head CT shows no acute intracranial process -  Cirrhosis, nonalcoholic (HCC) - Ammonia level normal      Advance Care Planning:   Code Status: Full Code  Consults: None at this time  Family Communication: No family at bedside  Severity of Illness: The appropriate patient status for this patient is INPATIENT. Inpatient status is judged to be reasonable and necessary in order to provide the required intensity of service to ensure the patient's safety. The patient's presenting symptoms, physical exam findings, and initial radiographic and laboratory data  in the context of their chronic comorbidities is felt to place them at high risk for further clinical deterioration. Furthermore, it is not anticipated that the patient will be medically stable for discharge from the hospital within 2 midnights of admission.   * I certify that at the point of admission it is my clinical judgment that the patient will require inpatient hospital care spanning beyond 2 midnights from the point of admission due to high intensity of service, high risk for further deterioration and high frequency of surveillance required.*  Author: Lilyan Gilford, DO 10/17/2022 6:11 AM  For on call review [email or web address].

## 2022-10-17 NOTE — Assessment & Plan Note (Signed)
-   Creatinine bumped from 0.89>> 2.57 - 500 mL bolus given in the ED - Encourage p.o. intake - Trend in the a.m.

## 2022-10-17 NOTE — Progress Notes (Signed)
Subjective: Patient admitted this morning, see detailed H&P by Dr Camillo Flaming  72 y.o. female with medical history significant of type 2 diabetes mellitus, TIA, seizure, GERD, nonalcoholic cirrhosis, GERD, and more presents the ED with a chief complaint of confusion and falls.  Head CT was unremarkable Started on IV Rocephin for UTI Cr elevated to 2.57, baseline 0.89  Vitals:   10/17/22 0655 10/17/22 0744  BP: 102/73 (!) 107/56  Pulse: 90   Resp: 18 18  Temp: 99.2 F (37.3 C)   SpO2: 100% 98%      A/P AKI UTI Metabolic encephalopathy Diabetes mellitus type2 Atrial fibrillation Non alcoholic cirrhosis  Gram-negative rod bacteremia  Cont Rocephin Start LR @ 75 ml/h Blood cultures growing gram-negative rods, continue Rocephin 2 g IV every 24 hours. Follow blood culture and urine culture results     [name] Ide [facility]

## 2022-10-18 DIAGNOSIS — I4891 Unspecified atrial fibrillation: Secondary | ICD-10-CM | POA: Diagnosis not present

## 2022-10-18 DIAGNOSIS — K7581 Nonalcoholic steatohepatitis (NASH): Secondary | ICD-10-CM | POA: Diagnosis not present

## 2022-10-18 DIAGNOSIS — N179 Acute kidney failure, unspecified: Secondary | ICD-10-CM | POA: Diagnosis not present

## 2022-10-18 LAB — GLUCOSE, CAPILLARY
Glucose-Capillary: 131 mg/dL — ABNORMAL HIGH (ref 70–99)
Glucose-Capillary: 127 mg/dL — ABNORMAL HIGH (ref 70–99)
Glucose-Capillary: 149 mg/dL — ABNORMAL HIGH (ref 70–99)
Glucose-Capillary: 121 mg/dL — ABNORMAL HIGH (ref 70–99)

## 2022-10-18 LAB — CBC
HCT: 30.1 % — ABNORMAL LOW (ref 36.0–46.0)
Hemoglobin: 10.4 g/dL — ABNORMAL LOW (ref 12.0–15.0)
MCH: 30.4 pg (ref 26.0–34.0)
MCHC: 34.6 g/dL (ref 30.0–36.0)
MCV: 88 fL (ref 80.0–100.0)
Platelets: 105 10*3/uL — ABNORMAL LOW (ref 150–400)
RBC: 3.42 MIL/uL — ABNORMAL LOW (ref 3.87–5.11)
RDW: 13.9 % (ref 11.5–15.5)
WBC: 17.5 10*3/uL — ABNORMAL HIGH (ref 4.0–10.5)
nRBC: 0 % (ref 0.0–0.2)

## 2022-10-18 LAB — COMPREHENSIVE METABOLIC PANEL
ALT: 41 U/L (ref 0–44)
AST: 80 U/L — ABNORMAL HIGH (ref 15–41)
Albumin: 2.6 g/dL — ABNORMAL LOW (ref 3.5–5.0)
Alkaline Phosphatase: 80 U/L (ref 38–126)
Anion gap: 9 (ref 5–15)
BUN: 46 mg/dL — ABNORMAL HIGH (ref 8–23)
CO2: 19 mmol/L — ABNORMAL LOW (ref 22–32)
Calcium: 8.6 mg/dL — ABNORMAL LOW (ref 8.9–10.3)
Chloride: 105 mmol/L (ref 98–111)
Creatinine, Ser: 1.69 mg/dL — ABNORMAL HIGH (ref 0.44–1.00)
GFR, Estimated: 32 mL/min — ABNORMAL LOW (ref >60)
Glucose, Bld: 143 mg/dL — ABNORMAL HIGH (ref 70–99)
Potassium: 3 mmol/L — ABNORMAL LOW (ref 3.5–5.1)
Sodium: 133 mmol/L — ABNORMAL LOW (ref 135–145)
Total Bilirubin: 1.2 mg/dL (ref 0.3–1.2)
Total Protein: 5.7 g/dL — ABNORMAL LOW (ref 6.5–8.1)

## 2022-10-18 LAB — PROTIME-INR
INR: 3.5 — ABNORMAL HIGH (ref 0.8–1.2)
Prothrombin Time: 35.2 seconds — ABNORMAL HIGH (ref 11.4–15.2)

## 2022-10-18 LAB — URINE CULTURE: Culture: >=100000 — AB

## 2022-10-18 MED ORDER — POTASSIUM CHLORIDE 10 MEQ/100ML IV SOLN
10.0000 meq | INTRAVENOUS | Status: AC
  Administered 2022-10-18 (×3): 10 meq via INTRAVENOUS
  Filled 2022-10-18 (×3): qty 100

## 2022-10-18 NOTE — Care Management Important Message (Signed)
Important Message  Patient Details  Name: Carol Richmond MRN: [id number] Date of Birth: August 08, 1950   Medicare Important Message Given:  Yes     Corey Harold 10/18/2022, 9:16 AM

## 2022-10-18 NOTE — Progress Notes (Addendum)
[facility]  PROGRESS NOTE  Carol Richmond ZOX:[id number] DOB: [birth year] DOA: 10/16/2022 PCP: Gabriel Earing, FNP   Brief HPI:   72 y.o. female with medical history significant of type 2 diabetes mellitus, TIA, seizure, GERD, nonalcoholic cirrhosis, GERD, and more presents the ED with a chief complaint of confusion and falls.  Head CT was unremarkable Started on IV Rocephin for UTI Cr elevated to 2.57, baseline 0.89    Assessment/Plan:   E. coli bacteremia -Blood culture growing E. coli, urine culture also growing E. Coli -Continue ceftriaxone -Follow final blood culture results  Metabolic encephalopathy -Multifactorial in the setting of E. coli UTI as above, also has liver cirrhosis, -Ammonia level was 15  Acute kidney injury -Presented with creatinine of 2.57, improved to 1.69 today -Will hold Entresto until renal function improves -Follow renal function in a.m.  Hypotension -Hold metoprolol and Entresto as above  Atrial fibrillation -Continue anticoagulation with warfarin -Continue amiodarone  Diabetes mellitus type 2 -Continue sliding scale insulin with NovoLog -CBG well-controlled  Hypokalemia -Potassium is 3.0 this morning -Replace potassium and follow BMP in am  NASH cirrhosis -Ammonia level 15 -Continue lactulose  Medications     amiodarone  200 mg Oral Daily   citalopram  10 mg Oral Daily   insulin aspart  0-15 Units Subcutaneous TID WC   insulin aspart  0-5 Units Subcutaneous QHS   lactulose  20 g Oral TID   lamoTRIgine  200 mg Oral BID   metoprolol tartrate  12.5 mg Oral BID   pantoprazole  40 mg Oral BID   sacubitril-valsartan  1 tablet Oral BID   Warfarin - Pharmacist Dosing Inpatient   Does not apply q1600     Data Reviewed:   CBG:  Recent Labs  Lab 10/17/22 0757 10/17/22 1118 10/17/22 1625 10/17/22 2106 10/18/22 0811  GLUCAP 165* 139* 145* 125* 131*    SpO2: 99 % O2 Flow Rate (L/min): 2 L/min    Vitals:    10/17/22 2020 10/17/22 2327 10/18/22 0414 10/18/22 0726  BP: (!) 98/59 (!) 116/52 (!) 110/57 (!) 94/54  Pulse: 70 82 79 72  Resp: 16 18 18 19   Temp: 98.3 F (36.8 C) 97.8 F (36.6 C) 98.1 F (36.7 C) 97.8 F (36.6 C)  TempSrc: Oral Oral Oral Oral  SpO2: 98% 100% 97% 99%  Weight:      Height:          Data Reviewed:  Basic Metabolic Panel: Recent Labs  Lab 10/16/22 1810 10/17/22 0333 10/18/22 0400  NA 129* 130* 133*  K 5.0 4.1 3.0*  CL 96* 98 105  CO2 20* 21* 19*  GLUCOSE 196* 163* 143*  BUN 54* 55* 46*  CREATININE 2.57* 2.17* 1.69*  CALCIUM 9.6 8.8* 8.6*  MG  --  2.1  --     CBC: Recent Labs  Lab 10/16/22 1810 10/17/22 0333 10/18/22 0400  WBC 28.6* 20.8* 17.5*  NEUTROABS  --  16.4*  --   HGB 11.6* 10.6* 10.4*  HCT 34.5* 31.1* 30.1*  MCV 91.3 88.9 88.0  PLT 113* 101* 105*    LFT Recent Labs  Lab 10/16/22 1810 10/17/22 0333 10/18/22 0400  AST 63* 56* 80*  ALT 34 33 41  ALKPHOS 86 74 80  BILITOT 1.2 1.3* 1.2  PROT 6.4* 6.1* 5.7*  ALBUMIN 3.3* 2.9* 2.6*     Antibiotics: Anti-infectives (From admission, onward)    Start     Dose/Rate Route Frequency Ordered Stop   10/17/22  2200  cefTRIAXone (ROCEPHIN) 1 g in sodium chloride 0.9 % 100 mL IVPB  Status:  Discontinued        1 g 200 mL/hr over 30 Minutes Intravenous Every 24 hours 10/16/22 2315 10/17/22 0943   10/17/22 2200  cefTRIAXone (ROCEPHIN) 2 g in sodium chloride 0.9 % 100 mL IVPB        2 g 200 mL/hr over 30 Minutes Intravenous Every 24 hours 10/17/22 0943     10/16/22 2200  cefTRIAXone (ROCEPHIN) 2 g in sodium chloride 0.9 % 100 mL IVPB        2 g 200 mL/hr over 30 Minutes Intravenous  Once 10/16/22 2150 10/17/22 0020        DVT prophylaxis: Warfarin  Code Status: Full code  Family Communication: No family at bedside   CONSULTS    Subjective   Pleasantly confused this morning.  Objective    Physical Examination:   G general-appears in no acute distress Heart-S1-S2,  regular, no murmur auscultated Lungs-clear to auscultation bilaterally, no wheezing or crackles auscultated Abdomen-soft, nontender, no organomegaly Extremities-no edema in the lower extremities Neuro-alert, oriented to self and place only  Status is: Inpatient:             [name] Ide   [facility] If 7PM-7AM, please contact night-coverage at [email or web address], Office  [phone]   10/18/2022, 8:25 AM  LOS: 2 days

## 2022-10-18 NOTE — Plan of Care (Signed)

## 2022-10-18 NOTE — Progress Notes (Signed)
Patient up intermittently throughout night for bathroom assist, c/o nausea in evening PRN antiemetic administered with effectiveness noted, PRN sleep aid given with little effectiveness noted. Patient resting quietly with fall precations in place and call bell within reach.

## 2022-10-18 NOTE — Progress Notes (Signed)
ANTICOAGULATION CONSULT NOTE   Pharmacy Consult for Warfarin Indication: atrial fibrillation  No Known Allergies  Patient Measurements: Height: 5\' 4"  (162.6 cm) Weight: 68.4 kg (150 lb 12.7 oz) IBW/kg (Calculated) : 54.7 Heparin Dosing Weight:   Vital Signs: Temp: 97.8 F (36.6 C) (08/30 0726) Temp Source: Oral (08/30 0726) BP: 94/54 (08/30 0726) Pulse Rate: 72 (08/30 0726)  Labs: Recent Labs    10/16/22 1810 10/17/22 0333 10/18/22 0400  HGB 11.6* 10.6* 10.4*  HCT 34.5* 31.1* 30.1*  PLT 113* 101* 105*  LABPROT 25.9*  --  35.2*  INR 2.4*  --  3.5*  CREATININE 2.57* 2.17* 1.69*    Estimated Creatinine Clearance: 29.4 mL/min (A) (by C-G formula based on SCr of 1.69 mg/dL (H)).   Medical History: Past Medical History:  Diagnosis Date   Asthma    AVNRT (AV nodal re-entry tachycardia)    Carotid artery disease (HCC)    Cirrhosis (HCC)    Diverticulosis of colon (without mention of hemorrhage)    Essential hypertension    Functional ovarian cysts    GERD (gastroesophageal reflux disease)    Hemorrhoids, external, without mention of complication    Hormone replacement therapy (postmenopausal)    IBS (irritable bowel syndrome)    Migraine    Mitral valve disease    Pacemaker    Complete heart block - Medtronic   Right knee DJD    Seizures (HCC)    Sleep apnea    TIA (transient ischemic attack)    Type 2 diabetes mellitus (HCC)     Assessment: Patient presented to ED due to confusion and falls, with a PMH significant of T2DM, TIA, seizure, GERD and NASH cirrhosis. CT head shows no acute intracranial process.   AKI improving Scr 2.57 > 2.17>1.69. Hemoglobin stable in 10s. Platelet stable. INR increased overnight from 2.4 to 3.5 PM. No bleeding documented. Warfarin home dose 5 mg daily except 2.5 mg on Mondays, Wednesdays and Fridays.   Patient is on amiodarone which can interact but she was taking that prior to admit.   Goal of Therapy:  INR 2-3 Monitor  platelets by anticoagulation protocol: Yes   Plan:  Hold warfarin today, monitor trend Check INR daily.  Sheppard Coil PharmD., BCPS Clinical Pharmacist 10/18/2022 8:54 AM

## 2022-10-18 NOTE — Plan of Care (Signed)
  Problem: Coping: Goal: Ability to adjust to condition or change in health will improve Outcome: Progressing   Problem: Health Behavior/Discharge Planning: Goal: Ability to manage health-related needs will improve Outcome: Progressing   Problem: Metabolic: Goal: Ability to maintain appropriate glucose levels will improve Outcome: Progressing   Problem: Tissue Perfusion: Goal: Adequacy of tissue perfusion will improve Outcome: Progressing   Problem: Activity: Goal: Risk for activity intolerance will decrease Outcome: Progressing   Problem: Nutrition: Goal: Adequate nutrition will be maintained Outcome: Progressing

## 2022-10-19 DIAGNOSIS — G9341 Metabolic encephalopathy: Secondary | ICD-10-CM | POA: Diagnosis not present

## 2022-10-19 DIAGNOSIS — N179 Acute kidney failure, unspecified: Secondary | ICD-10-CM | POA: Diagnosis not present

## 2022-10-19 DIAGNOSIS — I4891 Unspecified atrial fibrillation: Secondary | ICD-10-CM | POA: Diagnosis not present

## 2022-10-19 DIAGNOSIS — K7460 Unspecified cirrhosis of liver: Secondary | ICD-10-CM | POA: Diagnosis not present

## 2022-10-19 LAB — CULTURE, BLOOD (ROUTINE X 2): Special Requests: ADEQUATE

## 2022-10-19 LAB — GLUCOSE, CAPILLARY
Glucose-Capillary: 125 mg/dL — ABNORMAL HIGH (ref 70–99)
Glucose-Capillary: 133 mg/dL — ABNORMAL HIGH (ref 70–99)
Glucose-Capillary: 109 mg/dL — ABNORMAL HIGH (ref 70–99)
Glucose-Capillary: 117 mg/dL — ABNORMAL HIGH (ref 70–99)

## 2022-10-19 LAB — CBC
HCT: 31.9 % — ABNORMAL LOW (ref 36.0–46.0)
Hemoglobin: 11 g/dL — ABNORMAL LOW (ref 12.0–15.0)
MCH: 30.2 pg (ref 26.0–34.0)
MCHC: 34.5 g/dL (ref 30.0–36.0)
MCV: 87.6 fL (ref 80.0–100.0)
Platelets: 126 10*3/uL — ABNORMAL LOW (ref 150–400)
RBC: 3.64 MIL/uL — ABNORMAL LOW (ref 3.87–5.11)
RDW: 14.1 % (ref 11.5–15.5)
WBC: 21 10*3/uL — ABNORMAL HIGH (ref 4.0–10.5)
nRBC: 0 % (ref 0.0–0.2)

## 2022-10-19 LAB — BASIC METABOLIC PANEL
Anion gap: 10 (ref 5–15)
BUN: 35 mg/dL — ABNORMAL HIGH (ref 8–23)
CO2: 18 mmol/L — ABNORMAL LOW (ref 22–32)
Calcium: 8.5 mg/dL — ABNORMAL LOW (ref 8.9–10.3)
Chloride: 109 mmol/L (ref 98–111)
Creatinine, Ser: 1.44 mg/dL — ABNORMAL HIGH (ref 0.44–1.00)
GFR, Estimated: 39 mL/min — ABNORMAL LOW (ref >60)
Glucose, Bld: 146 mg/dL — ABNORMAL HIGH (ref 70–99)
Potassium: 3 mmol/L — ABNORMAL LOW (ref 3.5–5.1)
Sodium: 137 mmol/L (ref 135–145)

## 2022-10-19 LAB — PROTIME-INR
INR: 4.6 (ref 0.8–1.2)
Prothrombin Time: 43.8 s — ABNORMAL HIGH (ref 11.4–15.2)

## 2022-10-19 MED ORDER — POTASSIUM CHLORIDE 10 MEQ/100ML IV SOLN
10.0000 meq | INTRAVENOUS | Status: AC
  Administered 2022-10-19 (×4): 10 meq via INTRAVENOUS
  Filled 2022-10-19 (×4): qty 100

## 2022-10-19 MED ORDER — SODIUM CHLORIDE 0.9 % IV SOLN
2.0000 g | INTRAVENOUS | Status: DC
  Administered 2022-10-19 – 2022-10-20 (×2): 2 g via INTRAVENOUS
  Filled 2022-10-19 (×2): qty 20

## 2022-10-19 NOTE — Progress Notes (Signed)
[facility]  PROGRESS NOTE  Carol Richmond WUJ:[id number] DOB: 25-Apr-1950 DOA: 10/16/2022 PCP: Gabriel Earing, FNP   Brief HPI:   72 y.o. female with medical history significant of type 2 diabetes mellitus, TIA, seizure, GERD, nonalcoholic cirrhosis, GERD, and more presents the ED with a chief complaint of confusion and falls.  Head CT was unremarkable Started on IV Rocephin for UTI Cr elevated to 2.57, baseline 0.89    Assessment/Plan:   E. coli bacteremia -Blood culture growing E. coli, urine culture also growing E. Coli; sensitive to Rocephin -Continue ceftriaxone   Metabolic encephalopathy -Improving -Multifactorial in the setting of E. coli UTI as above, also has liver cirrhosis, -Ammonia level was 15  Acute kidney injury -Presented with creatinine of 2.57, improved to 1.44 today -Will hold Entresto until renal function improves -Follow renal function in a.m.  Hypotension -Hold metoprolol and Entresto as above  Atrial fibrillation -Continue anticoagulation with warfarin -Continue amiodarone -INR is supratherapeutic, warfarin on hold  Diabetes mellitus type 2 -Continue sliding scale insulin with NovoLog -CBG well-controlled  Hypokalemia -Potassium is 3.0 this morning -Replace potassium and follow BMP in am  NASH cirrhosis -Ammonia level 15 -Continue lactulose  Medications     amiodarone  200 mg Oral Daily   citalopram  10 mg Oral Daily   insulin aspart  0-15 Units Subcutaneous TID WC   insulin aspart  0-5 Units Subcutaneous QHS   lactulose  20 g Oral TID   lamoTRIgine  200 mg Oral BID   pantoprazole  40 mg Oral BID   Warfarin - Pharmacist Dosing Inpatient   Does not apply q1600     Data Reviewed:   CBG:  Recent Labs  Lab 10/18/22 0811 10/18/22 1152 10/18/22 1658 10/18/22 2041 10/19/22 0738  GLUCAP 131* 127* 149* 121* 125*    SpO2: 99 % O2 Flow Rate (L/min): 2 L/min    Vitals:   10/18/22 1400 10/18/22 1743 10/18/22 2039  10/19/22 0601  BP:  112/60 94/60 (!) 107/59  Pulse: 73 76 72 76  Resp: (!) 23 17 19 18   Temp:  98.3 F (36.8 C) 98.2 F (36.8 C) 98.1 F (36.7 C)  TempSrc:   Oral Oral  SpO2: 100% 100% 100% 99%  Weight:      Height:          Data Reviewed:  Basic Metabolic Panel: Recent Labs  Lab 10/16/22 1810 10/17/22 0333 10/18/22 0400 10/19/22 0449  NA 129* 130* 133* 137  K 5.0 4.1 3.0* 3.0*  CL 96* 98 105 109  CO2 20* 21* 19* 18*  GLUCOSE 196* 163* 143* 146*  BUN 54* 55* 46* 35*  CREATININE 2.57* 2.17* 1.69* 1.44*  CALCIUM 9.6 8.8* 8.6* 8.5*  MG  --  2.1  --   --     CBC: Recent Labs  Lab 10/16/22 1810 10/17/22 0333 10/18/22 0400 10/19/22 0449  WBC 28.6* 20.8* 17.5* 21.0*  NEUTROABS  --  16.4*  --   --   HGB 11.6* 10.6* 10.4* 11.0*  HCT 34.5* 31.1* 30.1* 31.9*  MCV 91.3 88.9 88.0 87.6  PLT 113* 101* 105* 126*    LFT Recent Labs  Lab 10/16/22 1810 10/17/22 0333 10/18/22 0400  AST 63* 56* 80*  ALT 34 33 41  ALKPHOS 86 74 80  BILITOT 1.2 1.3* 1.2  PROT 6.4* 6.1* 5.7*  ALBUMIN 3.3* 2.9* 2.6*     Antibiotics: Anti-infectives (From admission, onward)    Start     Dose/Rate  Route Frequency Ordered Stop   10/17/22 2200  cefTRIAXone (ROCEPHIN) 1 g in sodium chloride 0.9 % 100 mL IVPB  Status:  Discontinued        1 g 200 mL/hr over 30 Minutes Intravenous Every 24 hours 10/16/22 2315 10/17/22 0943   10/17/22 2200  cefTRIAXone (ROCEPHIN) 2 g in sodium chloride 0.9 % 100 mL IVPB        2 g 200 mL/hr over 30 Minutes Intravenous Every 24 hours 10/17/22 0943     10/16/22 2200  cefTRIAXone (ROCEPHIN) 2 g in sodium chloride 0.9 % 100 mL IVPB        2 g 200 mL/hr over 30 Minutes Intravenous  Once 10/16/22 2150 10/17/22 0020        DVT prophylaxis: Warfarin  Code Status: Full code  Family Communication: No family at bedside   CONSULTS    Subjective   Feels better this morning.  Confusion has improved.  Objective    Physical  Examination:   General-appears in no acute distress Heart-S1-S2, regular, no murmur auscultated Lungs-clear to auscultation bilaterally, no wheezing or crackles auscultated Abdomen-soft, nontender, no organomegaly Extremities-no edema in the lower extremities Neuro-alert, oriented x3, no focal deficit noted  Status is: Inpatient:             [name] Ide   [facility] If 7PM-7AM, please contact night-coverage at [email or web address], Office  [phone]   10/19/2022, 8:50 AM  LOS: 3 days

## 2022-10-19 NOTE — Progress Notes (Signed)
ANTICOAGULATION CONSULT NOTE   Pharmacy Consult for Warfarin Indication: atrial fibrillation  No Known Allergies  Patient Measurements: Height: 5\' 4"  (162.6 cm) Weight: 68.4 kg (150 lb 12.7 oz) IBW/kg (Calculated) : 54.7 Heparin Dosing Weight:   Vital Signs: Temp: 98.1 F (36.7 C) (08/31 0601) Temp Source: Oral (08/31 0601) BP: 107/59 (08/31 0601) Pulse Rate: 76 (08/31 0601)  Labs: Recent Labs    10/16/22 1810 10/17/22 0333 10/18/22 0400 10/19/22 0449  HGB 11.6* 10.6* 10.4* 11.0*  HCT 34.5* 31.1* 30.1* 31.9*  PLT 113* 101* 105* 126*  LABPROT 25.9*  --  35.2* 43.8*  INR 2.4*  --  3.5* 4.6*  CREATININE 2.57* 2.17* 1.69* 1.44*    Estimated Creatinine Clearance: 34.5 mL/min (A) (by C-G formula based on SCr of 1.44 mg/dL (H)).   Medical History: Past Medical History:  Diagnosis Date   Asthma    AVNRT (AV nodal re-entry tachycardia)    Carotid artery disease (HCC)    Cirrhosis (HCC)    Diverticulosis of colon (without mention of hemorrhage)    Essential hypertension    Functional ovarian cysts    GERD (gastroesophageal reflux disease)    Hemorrhoids, external, without mention of complication    Hormone replacement therapy (postmenopausal)    IBS (irritable bowel syndrome)    Migraine    Mitral valve disease    Pacemaker    Complete heart block - Medtronic   Right knee DJD    Seizures (HCC)    Sleep apnea    TIA (transient ischemic attack)    Type 2 diabetes mellitus (HCC)     Assessment: Patient presented to ED due to confusion and falls, with a PMH significant of T2DM, TIA, seizure, GERD and NASH cirrhosis. CT head shows no acute intracranial process.   AKI improving Scr 2.57 > 1.44. Hemoglobin stable in 10-11s. INR continues to increase overnight from 2.4>3.5>4.6. No bleeding documented. Patient does not appear to be eating very much  Warfarin home dose 5 mg daily except 2.5 mg on Mondays, Wednesdays and Fridays.   Patient is on amiodarone which can  interact but she was taking this medication prior to admit so unlikely to be contributing.   Goal of Therapy:  INR 2-3 Monitor platelets by anticoagulation protocol: Yes   Plan:  Hold warfarin today, monitor trend Consider a few doses of Boost nutritional supplement to add a small amount of vit k content to diet to slowly reverse INR Check INR daily.  Sheppard Coil PharmD., BCPS Clinical Pharmacist 10/19/2022 9:10 AM

## 2022-10-19 NOTE — Plan of Care (Signed)
  Problem: Education: Goal: Ability to describe self-care measures that may prevent or decrease complications (Diabetes Survival Skills Education) will improve Outcome: Progressing   Problem: Coping: Goal: Ability to adjust to condition or change in health will improve Outcome: Progressing   Problem: Fluid Volume: Goal: Ability to maintain a balanced intake and output will improve Outcome: Progressing   Problem: Health Behavior/Discharge Planning: Goal: Ability to identify and utilize available resources and services will improve Outcome: Progressing   Problem: Skin Integrity: Goal: Risk for impaired skin integrity will decrease Outcome: Progressing   Problem: Tissue Perfusion: Goal: Adequacy of tissue perfusion will improve Outcome: Progressing   

## 2022-10-20 DIAGNOSIS — K7460 Unspecified cirrhosis of liver: Secondary | ICD-10-CM | POA: Diagnosis not present

## 2022-10-20 DIAGNOSIS — G9341 Metabolic encephalopathy: Secondary | ICD-10-CM | POA: Diagnosis not present

## 2022-10-20 DIAGNOSIS — I4891 Unspecified atrial fibrillation: Secondary | ICD-10-CM | POA: Diagnosis not present

## 2022-10-20 DIAGNOSIS — N179 Acute kidney failure, unspecified: Secondary | ICD-10-CM | POA: Diagnosis not present

## 2022-10-20 LAB — GLUCOSE, CAPILLARY
Glucose-Capillary: 138 mg/dL — ABNORMAL HIGH (ref 70–99)
Glucose-Capillary: 135 mg/dL — ABNORMAL HIGH (ref 70–99)
Glucose-Capillary: 113 mg/dL — ABNORMAL HIGH (ref 70–99)
Glucose-Capillary: 121 mg/dL — ABNORMAL HIGH (ref 70–99)

## 2022-10-20 LAB — BASIC METABOLIC PANEL
Anion gap: 9 (ref 5–15)
BUN: 27 mg/dL — ABNORMAL HIGH (ref 8–23)
CO2: 20 mmol/L — ABNORMAL LOW (ref 22–32)
Calcium: 8.4 mg/dL — ABNORMAL LOW (ref 8.9–10.3)
Chloride: 111 mmol/L (ref 98–111)
Creatinine, Ser: 1.4 mg/dL — ABNORMAL HIGH (ref 0.44–1.00)
GFR, Estimated: 40 mL/min — ABNORMAL LOW (ref >60)
Glucose, Bld: 182 mg/dL — ABNORMAL HIGH (ref 70–99)
Potassium: 3.4 mmol/L — ABNORMAL LOW (ref 3.5–5.1)
Sodium: 140 mmol/L (ref 135–145)

## 2022-10-20 LAB — PROTIME-INR
INR: 4.7 (ref 0.8–1.2)
Prothrombin Time: 44.4 s — ABNORMAL HIGH (ref 11.4–15.2)

## 2022-10-20 MED ORDER — POTASSIUM CHLORIDE CRYS ER 20 MEQ PO TBCR
40.0000 meq | EXTENDED_RELEASE_TABLET | Freq: Once | ORAL | Status: AC
  Administered 2022-10-20: 40 meq via ORAL
  Filled 2022-10-20: qty 2

## 2022-10-20 NOTE — Plan of Care (Signed)

## 2022-10-20 NOTE — Progress Notes (Signed)
[facility]  PROGRESS NOTE  Carol Richmond ZDG:[id number] DOB: 1951-01-13 DOA: 10/16/2022 PCP: Gabriel Earing, FNP   Brief HPI:   72 y.o. female with medical history significant of type 2 diabetes mellitus, TIA, seizure, GERD, nonalcoholic cirrhosis, GERD, and more presents the ED with a chief complaint of confusion and falls.  Head CT was unremarkable Started on IV Rocephin for UTI Cr elevated to 2.57, baseline 0.89    Assessment/Plan:   E. coli bacteremia -Blood culture growing E. coli, urine culture also growing E. Coli; sensitive to Rocephin -Continue ceftriaxone   Metabolic encephalopathy -Resolved -Multifactorial in the setting of E. coli UTI as above, also has liver cirrhosis, -Ammonia level was 15  Acute kidney injury -Presented with creatinine of 2.57, improved to 1.40 today -Will hold Entresto until renal function improves -Follow renal function in a.m.  Hypotension -Hold metoprolol and Entresto as above  Atrial fibrillation -Continue anticoagulation with warfarin -Continue amiodarone -INR is supratherapeutic at 4.7, warfarin on hold  Diabetes mellitus type 2 -Continue sliding scale insulin with NovoLog -CBG well-controlled  Hypokalemia -Potassium is 3.4 this morning -Will give potassium and follow BMP in am  NASH cirrhosis -Ammonia level 15 -Continue lactulose  Medications     amiodarone  200 mg Oral Daily   citalopram  10 mg Oral Daily   insulin aspart  0-15 Units Subcutaneous TID WC   insulin aspart  0-5 Units Subcutaneous QHS   lactulose  20 g Oral TID   lamoTRIgine  200 mg Oral BID   pantoprazole  40 mg Oral BID   Warfarin - Pharmacist Dosing Inpatient   Does not apply q1600     Data Reviewed:   CBG:  Recent Labs  Lab 10/19/22 0738 10/19/22 1144 10/19/22 1632 10/19/22 2037 10/20/22 0720  GLUCAP 125* 133* 109* 117* 138*    SpO2: 99 % O2 Flow Rate (L/min): 2 L/min    Vitals:   10/19/22 0900 10/19/22 1430  10/19/22 2039 10/20/22 0517  BP: 116/73 106/74 107/61 119/71  Pulse: 68 75 67   Resp:   18   Temp: 97.8 F (36.6 C) 98.1 F (36.7 C) 98.1 F (36.7 C) 98.4 F (36.9 C)  TempSrc: Oral Oral Oral Oral  SpO2: 100% 99% 100% 99%  Weight:      Height:          Data Reviewed:  Basic Metabolic Panel: Recent Labs  Lab 10/16/22 1810 10/17/22 0333 10/18/22 0400 10/19/22 0449  NA 129* 130* 133* 137  K 5.0 4.1 3.0* 3.0*  CL 96* 98 105 109  CO2 20* 21* 19* 18*  GLUCOSE 196* 163* 143* 146*  BUN 54* 55* 46* 35*  CREATININE 2.57* 2.17* 1.69* 1.44*  CALCIUM 9.6 8.8* 8.6* 8.5*  MG  --  2.1  --   --     CBC: Recent Labs  Lab 10/16/22 1810 10/17/22 0333 10/18/22 0400 10/19/22 0449  WBC 28.6* 20.8* 17.5* 21.0*  NEUTROABS  --  16.4*  --   --   HGB 11.6* 10.6* 10.4* 11.0*  HCT 34.5* 31.1* 30.1* 31.9*  MCV 91.3 88.9 88.0 87.6  PLT 113* 101* 105* 126*    LFT Recent Labs  Lab 10/16/22 1810 10/17/22 0333 10/18/22 0400  AST 63* 56* 80*  ALT 34 33 41  ALKPHOS 86 74 80  BILITOT 1.2 1.3* 1.2  PROT 6.4* 6.1* 5.7*  ALBUMIN 3.3* 2.9* 2.6*     Antibiotics: Anti-infectives (From admission, onward)    Start  Dose/Rate Route Frequency Ordered Stop   10/19/22 2200  cefTRIAXone (ROCEPHIN) 2 g in sodium chloride 0.9 % 100 mL IVPB        2 g 200 mL/hr over 30 Minutes Intravenous Every 24 hours 10/19/22 0915     10/17/22 2200  cefTRIAXone (ROCEPHIN) 1 g in sodium chloride 0.9 % 100 mL IVPB  Status:  Discontinued        1 g 200 mL/hr over 30 Minutes Intravenous Every 24 hours 10/16/22 2315 10/17/22 0943   10/17/22 2200  cefTRIAXone (ROCEPHIN) 2 g in sodium chloride 0.9 % 100 mL IVPB  Status:  Discontinued        2 g 200 mL/hr over 30 Minutes Intravenous Every 24 hours 10/17/22 0943 10/19/22 0915   10/16/22 2200  cefTRIAXone (ROCEPHIN) 2 g in sodium chloride 0.9 % 100 mL IVPB        2 g 200 mL/hr over 30 Minutes Intravenous  Once 10/16/22 2150 10/17/22 0020        DVT  prophylaxis: Warfarin  Code Status: Full code  Family Communication: No family at bedside   CONSULTS    Subjective   Denies any complaints  Objective    Physical Examination:   General-appears in no acute distress Heart-S1-S2, regular, no murmur auscultated Lungs-clear to auscultation bilaterally, no wheezing or crackles auscultated Abdomen-soft, nontender, no organomegaly Extremities-no edema in the lower extremities Neuro-alert, oriented x3, no focal deficit noted  Status is: Inpatient:      Pressure Injury 10/19/22 Sacrum Stage 2 -  Partial thickness loss of dermis presenting as a shallow open injury with a red, pink wound bed without slough. 3x3 area (Active)  10/19/22 1231  Location: Sacrum  Location Orientation:   Staging: Stage 2 -  Partial thickness loss of dermis presenting as a shallow open injury with a red, pink wound bed without slough.  Wound Description (Comments): 3x3 area  Present on Admission:         Elizette Shek S Lowana Hable   [facility] If 7PM-7AM, please contact night-coverage at [email or web address], Office  [phone]   10/20/2022, 8:46 AM  LOS: 4 days

## 2022-10-20 NOTE — Progress Notes (Signed)
ANTICOAGULATION CONSULT NOTE   Pharmacy Consult for Warfarin Indication: atrial fibrillation  No Known Allergies  Patient Measurements: Height: 5\' 4"  (162.6 cm) Weight: 68.4 kg (150 lb 12.7 oz) IBW/kg (Calculated) : 54.7 Heparin Dosing Weight:   Vital Signs: Temp: 98.4 F (36.9 C) (09/01 0517) Temp Source: Oral (09/01 0517) BP: 119/71 (09/01 0517) Pulse Rate: 67 (08/31 2039)  Labs: Recent Labs    10/18/22 0400 10/19/22 0449 10/20/22 0422  HGB 10.4* 11.0*  --   HCT 30.1* 31.9*  --   PLT 105* 126*  --   LABPROT 35.2* 43.8* 44.4*  INR 3.5* 4.6* 4.7*  CREATININE 1.69* 1.44*  --     Estimated Creatinine Clearance: 34.5 mL/min (A) (by C-G formula based on SCr of 1.44 mg/dL (H)).   Medical History: Past Medical History:  Diagnosis Date   Asthma    AVNRT (AV nodal re-entry tachycardia)    Carotid artery disease (HCC)    Cirrhosis (HCC)    Diverticulosis of colon (without mention of hemorrhage)    Essential hypertension    Functional ovarian cysts    GERD (gastroesophageal reflux disease)    Hemorrhoids, external, without mention of complication    Hormone replacement therapy (postmenopausal)    IBS (irritable bowel syndrome)    Migraine    Mitral valve disease    Pacemaker    Complete heart block - Medtronic   Right knee DJD    Seizures (HCC)    Sleep apnea    TIA (transient ischemic attack)    Type 2 diabetes mellitus (HCC)     Assessment: Patient presented to ED due to confusion and falls, with a PMH significant of T2DM, TIA, seizure, GERD and NASH cirrhosis. CT head shows no acute intracranial process.   AKI improving Scr 2.57 > 1.44. Hemoglobin stable in 10-11s. INR continues to increase overnight from 2.4>3.5>4.6>4.7 but appears to be plateauing. No bleeding documented. Patient does not appear to be eating very much and this is likely cause of warfarin sensitivity.   Warfarin home dose 5 mg daily except 2.5 mg on Mondays, Wednesdays and Fridays.    Patient is on amiodarone which can interact but she was taking this medication prior to admit so unlikely to be contributing.   Goal of Therapy:  INR 2-3 Monitor platelets by anticoagulation protocol: Yes   Plan:  Hold warfarin again today 9/1, monitor trend Consider a few doses of Boost nutritional supplement to add a small amount of vit k content to diet to slowly reverse INR Check INR daily.  Sheppard Coil PharmD., BCPS Clinical Pharmacist 10/20/2022 7:55 AM

## 2022-10-21 DIAGNOSIS — K7581 Nonalcoholic steatohepatitis (NASH): Secondary | ICD-10-CM | POA: Diagnosis not present

## 2022-10-21 DIAGNOSIS — I4891 Unspecified atrial fibrillation: Secondary | ICD-10-CM | POA: Diagnosis not present

## 2022-10-21 DIAGNOSIS — E1165 Type 2 diabetes mellitus with hyperglycemia: Secondary | ICD-10-CM | POA: Diagnosis not present

## 2022-10-21 DIAGNOSIS — N179 Acute kidney failure, unspecified: Secondary | ICD-10-CM | POA: Diagnosis not present

## 2022-10-21 LAB — BASIC METABOLIC PANEL
Anion gap: 7 (ref 5–15)
BUN: 21 mg/dL (ref 8–23)
CO2: 17 mmol/L — ABNORMAL LOW (ref 22–32)
Calcium: 8.2 mg/dL — ABNORMAL LOW (ref 8.9–10.3)
Chloride: 113 mmol/L — ABNORMAL HIGH (ref 98–111)
Creatinine, Ser: 1.28 mg/dL — ABNORMAL HIGH (ref 0.44–1.00)
GFR, Estimated: 45 mL/min — ABNORMAL LOW (ref >60)
Glucose, Bld: 129 mg/dL — ABNORMAL HIGH (ref 70–99)
Potassium: 3.5 mmol/L (ref 3.5–5.1)
Sodium: 137 mmol/L (ref 135–145)

## 2022-10-21 LAB — GLUCOSE, CAPILLARY
Glucose-Capillary: 163 mg/dL — ABNORMAL HIGH (ref 70–99)
Glucose-Capillary: 138 mg/dL — ABNORMAL HIGH (ref 70–99)

## 2022-10-21 LAB — CBC
HCT: 30.1 % — ABNORMAL LOW (ref 36.0–46.0)
Hemoglobin: 10.2 g/dL — ABNORMAL LOW (ref 12.0–15.0)
MCH: 30.2 pg (ref 26.0–34.0)
MCHC: 33.9 g/dL (ref 30.0–36.0)
MCV: 89.1 fL (ref 80.0–100.0)
Platelets: 174 10*3/uL (ref 150–400)
RBC: 3.38 MIL/uL — ABNORMAL LOW (ref 3.87–5.11)
RDW: 14.9 % (ref 11.5–15.5)
WBC: 21.4 10*3/uL — ABNORMAL HIGH (ref 4.0–10.5)
nRBC: 0 % (ref 0.0–0.2)

## 2022-10-21 LAB — PROTIME-INR
INR: 4.4 (ref 0.8–1.2)
Prothrombin Time: 42 seconds — ABNORMAL HIGH (ref 11.4–15.2)

## 2022-10-21 MED ORDER — CEFADROXIL 500 MG PO CAPS: 500.0000 mg | ORAL_CAPSULE | Freq: Two times a day (BID) | ORAL | 0 refills | Status: AC

## 2022-10-21 MED ORDER — CEFADROXIL 500 MG PO CAPS
500.0000 mg | ORAL_CAPSULE | Freq: Two times a day (BID) | ORAL | Status: DC
  Administered 2022-10-21: 500 mg via ORAL
  Filled 2022-10-21 (×5): qty 1

## 2022-10-21 NOTE — Progress Notes (Signed)
Lab called with critical results INR 4.4,PT 42.0. Dr Sharl Ma notified. Plan of care on going.

## 2022-10-21 NOTE — TOC Transition Note (Signed)
Transition of Care [facility]) - CM/SW Discharge Note   Patient Details  Name: Carol Richmond MRN: [id number] Date of Birth: [birth year]  [facility] CM/SW Contact:  Karn Cassis, LCSW Phone Number: 10/21/2022, 11:09 AM   Clinical Narrative: PT recommending HHPT. Pt agreeable with no preference on agency. Referred and accepted by [facility] with Frances Furbish. Added to AVS. HHPT order in. Pt's daughter will pick up pt today. No other needs reported.       Final next level of care: Home w Home Health Services Barriers to Discharge: Barriers Resolved   Patient Goals and CMS Choice   Choice offered to / list presented to : Patient  Discharge Placement                      Patient and family notified of of transfer: 10/21/22  Discharge Plan and Services Additional resources added to the After Visit Summary for   In-house Referral: Clinical Social Work                        HH Arranged: PT HH Agency: [facility] Date [facility] Agency Contacted: 10/21/22 Time HH Agency Contacted: [id number] Representative spoke with at [facility] Agency: Kandee Keen  Social Determinants of Health (SDOH) Interventions SDOH Screenings   Food Insecurity: No Food Insecurity (10/17/2022)  Housing: Low Risk  (10/17/2022)  Transportation Needs: No Transportation Needs (10/17/2022)  Utilities: Not At Risk (10/17/2022)  Alcohol Screen: Low Risk  (09/25/2022)  Depression (PHQ2-9): Low Risk  (09/25/2022)  Financial Resource Strain: Low Risk  (09/25/2022)  Physical Activity: Insufficiently Active (09/25/2022)  Social Connections: Socially Integrated (09/25/2022)  Stress: No Stress Concern Present (09/25/2022)  Tobacco Use: Low Risk  (10/17/2022)  Health Literacy: Adequate Health Literacy (09/25/2022)     Readmission Risk Interventions    10/17/2022   11:24 AM  Readmission Risk Prevention Plan  Transportation Screening Complete  Medication Review (RN Care Manager) Complete  HRI or Home Care Consult  Complete  SW Recovery Care/Counseling Consult Complete  Palliative Care Screening Not Applicable  Skilled Nursing Facility Not Applicable

## 2022-10-21 NOTE — Progress Notes (Signed)
Went over discharge instructions w/ pt and patients family.

## 2022-10-21 NOTE — Progress Notes (Addendum)
ANTICOAGULATION CONSULT NOTE   Pharmacy Consult for Warfarin Indication: atrial fibrillation  No Known Allergies  Patient Measurements: Height: 5\' 4"  (162.6 cm) Weight: 68.4 kg (150 lb 12.7 oz) IBW/kg (Calculated) : 54.7 Heparin Dosing Weight:   Vital Signs: Temp: 98.2 F (36.8 C) (09/02 0850) Temp Source: Oral (09/02 0850) BP: 121/59 (09/02 0850) Pulse Rate: 78 (09/02 0850)  Labs: Recent Labs    10/19/22 0449 10/20/22 0422 10/20/22 0919 10/21/22 0428  HGB 11.0*  --   --  10.2*  HCT 31.9*  --   --  30.1*  PLT 126*  --   --  174  LABPROT 43.8* 44.4*  --  42.0*  INR 4.6* 4.7*  --  4.4*  CREATININE 1.44*  --  1.40* 1.28*    Estimated Creatinine Clearance: 38.9 mL/min (A) (by C-G formula based on SCr of 1.28 mg/dL (H)).   Medical History: Past Medical History:  Diagnosis Date   Asthma    AVNRT (AV nodal re-entry tachycardia)    Carotid artery disease (HCC)    Cirrhosis (HCC)    Diverticulosis of colon (without mention of hemorrhage)    Essential hypertension    Functional ovarian cysts    GERD (gastroesophageal reflux disease)    Hemorrhoids, external, without mention of complication    Hormone replacement therapy (postmenopausal)    IBS (irritable bowel syndrome)    Migraine    Mitral valve disease    Pacemaker    Complete heart block - Medtronic   Right knee DJD    Seizures (HCC)    Sleep apnea    TIA (transient ischemic attack)    Type 2 diabetes mellitus (HCC)     Assessment: Patient presented to ED due to confusion and falls, with a PMH significant of T2DM, TIA, seizure, GERD and NASH cirrhosis. CT head shows no acute intracranial process.   AKI improving Scr 2.57 > 1.28. Hemoglobin stable in 10-11s. INR continues to increase overnight from 2.4>3.5>4.6>4.4 but appears to be trending. No bleeding documented. Patient does not appear to be eating very much and this is likely cause of warfarin sensitivity.   Warfarin home dose 5 mg daily except 2.5 mg  on Mondays, Wednesdays and Fridays.   Patient is on amiodarone which can interact but she was taking this medication prior to admit so unlikely to be contributing.   Goal of Therapy:  INR 2-3 Monitor platelets by anticoagulation protocol: Yes   Plan:  Hold warfarin for a couple of days, patient has INR machine at home to recheck later this week  Restart warfarin when INR <3  Ecoli uti/bacteremia - recommended cefadroxil on discharge which should not have a very low impact on INR  Sheppard Coil PharmD., BCPS Clinical Pharmacist 10/21/2022 9:05 AM

## 2022-10-21 NOTE — Care Management Important Message (Signed)
Important Message  Patient Details  Name: Carol Richmond MRN: [id number] Date of Birth: [birth year]   Medicare Important Message Given:  Yes     Corey Harold 10/21/2022, 11:22 AM

## 2022-10-21 NOTE — Plan of Care (Signed)
  Problem: Acute Rehab PT Goals(only PT should resolve) Goal: Pt Will Ambulate Flowsheets (Taken 10/21/2022 1038) Pt will Ambulate:  Independently  100 feet  Nelida Meuse PT, DPT Physical Therapist with [facility] [phone] office

## 2022-10-21 NOTE — Evaluation (Signed)
Physical Therapy Evaluation Patient Details Name: Carol Richmond MRN: [id number] DOB: [birth year] Today's Date: 10/21/2022  History of Present Illness  a 72 y.o. female with medical history significant of type 2 diabetes mellitus, TIA, seizure, GERD, nonalcoholic cirrhosis, GERD, and more presents the ED with a chief complaint of confusion and falls.  Clinical Impression   Pt is a 72yo female presenting to [facility] due to reason stated in the HPI.  Pt tolerated today's Physical Therapy Evaluation, was received up and mobilizing with NT. At baseline pt ambulates with out AD and independent. Current level is mildly lower with use of DME for safe ambulation. Muscle endurance is present, but mild balance impairments are evident.   Based upon these deficits/impairments, pt would benefit from skilled acute physical therapy services to address the above deficits and improve their functional status. PT recommends pt discharge to Home with HHPT in order to improve pt's functional status, safety and independence with functional mobility and overall QOL.       If plan is discharge home, recommend the following: A little help with walking and/or transfers   Can travel by private vehicle        Equipment Recommendations Other (comment) Forensic scientist chair)  Recommendations for Other Services       Functional Status Assessment Patient has had a recent decline in their functional status and demonstrates the ability to make significant improvements in function in a reasonable and predictable amount of time.     Precautions / Restrictions Precautions Precautions: None Restrictions Weight Bearing Restrictions: No      Mobility  Bed Mobility               General bed mobility comments: NT mobilizing pt to bathroom with RW as therapist entered. Hand off to PT after finished with bathroom needs. Patient Response: Cooperative  Transfers Overall transfer level:  Modified independent Equipment used: Rolling walker (2 wheels)               General transfer comment: modified independent with for sit/stand from recliner with use of arm rests for controlled descent. no cues, safe, slow movements.    Ambulation/Gait Ambulation/Gait assistance: Modified independent (Device/Increase time) Gait Distance (Feet): 150 Feet Assistive device: Rolling walker (2 wheels) Gait Pattern/deviations: WFL(Within Functional Limits), Decreased step length - right, Decreased step length - left, Decreased stride length       General Gait Details: modified independent with RW for 141ft in hallway. Slow, steady turns with increased cuing for turning.  Stairs            Wheelchair Mobility     Tilt Bed Tilt Bed Patient Response: Cooperative  Modified Rankin (Stroke Patients Only)       Balance Overall balance assessment: Needs assistance (Pt reports falling x1 before coming to hospital.) Sitting-balance support: No upper extremity supported Sitting balance-Leahy Scale: Normal Sitting balance - Comments: safe sitting balance on recliner.   Standing balance support: Bilateral upper extremity supported Standing balance-Leahy Scale: Good Standing balance comment: good/good standing balance with RW. Slow steady movement. Age appropriate balance reactions noted with turning.                             Pertinent Vitals/Pain Pain Assessment Pain Assessment: No/denies pain    Home Living Family/patient expects to be discharged to:: Private residence Living Arrangements: Alone Available Help at Discharge: Family;Available PRN/intermittently Type of Home: House Home Access:  Stairs to enter   [employer] of Steps: 2 threshold steps   Home Layout: One level Home Equipment: Agricultural consultant (2 wheels);BSC/3in1      Prior Function Prior Level of Function : Independent/Modified Independent             Mobility Comments: Pt  reports independence normally without AD. Sometimes uses RW. ADLs Comments: Independent with all ADLs and driving.     Extremity/Trunk Assessment   Upper Extremity Assessment Upper Extremity Assessment: Overall WFL for tasks assessed    Lower Extremity Assessment Lower Extremity Assessment: Overall WFL for tasks assessed    Cervical / Trunk Assessment Cervical / Trunk Assessment: Normal  Communication   Communication Communication: No apparent difficulties  Cognition Arousal: Alert Behavior During Therapy: WFL for tasks assessed/performed Overall Cognitive Status: Within Functional Limits for tasks assessed                                          General Comments      Exercises     Assessment/Plan    PT Assessment Patient needs continued PT services  PT Problem List Decreased strength;Decreased activity tolerance;Decreased mobility       PT Treatment Interventions DME instruction;Gait training;Functional mobility training;Therapeutic activities;Therapeutic exercise;Balance training;Neuromuscular re-education    PT Goals (Current goals can be found in the Care Plan section)  Acute Rehab PT Goals Patient Stated Goal: walk more Time For Goal Achievement: 10/25/22 Potential to Achieve Goals: Good    Frequency Min 2X/week     Co-evaluation               AM-PAC PT "6 Clicks" Mobility  Outcome Measure Help needed turning from your back to your side while in a flat bed without using bedrails?: None Help needed moving from lying on your back to sitting on the side of a flat bed without using bedrails?: None Help needed moving to and from a bed to a chair (including a wheelchair)?: None Help needed standing up from a chair using your arms (e.g., wheelchair or bedside chair)?: None Help needed to walk in hospital room?: None Help needed climbing 3-5 steps with a railing? : A Lot 6 Click Score: 22    End of Session Equipment Utilized During  Treatment: Gait belt Activity Tolerance: Patient tolerated treatment well Patient left: in chair;with call bell/phone within reach Nurse Communication: Mobility status PT Visit Diagnosis: Muscle weakness (generalized) (M62.81);History of falling (Z91.81);Unsteadiness on feet (R26.81)    Time: 7846-9629 PT Time Calculation (min) (ACUTE ONLY): 12 min   Charges:   PT Evaluation $PT Eval Low Complexity: 1 Low   PT General Charges $$ ACUTE PT VISIT: 1 Visit         Nelida Meuse PT, DPT Physical Therapist with [facility] [phone] office   Nelida Meuse 10/21/2022, 10:34 AM

## 2022-10-21 NOTE — Discharge Summary (Signed)
Physician Discharge Summary   Patient: Carol Richmond MRN: [id number] DOB: 12/27/1950  Admit date:     10/16/2022  Discharge date: 10/21/22  Discharge Physician: [name] Ide   PCP: Gabriel Earing, FNP   Recommendations at discharge:   Hold Coumadin for 3 days, check INR at PCP office before restarting Coumadin. Hold Lasix and potassium pills for 3 days.  Check renal function at PCP office in 3 days Patient will be discharged with home health PT  Discharge Diagnoses: Principal Problem:   AKI (acute kidney injury) (HCC) Active Problems:   Cirrhosis, nonalcoholic (HCC)   Acute metabolic encephalopathy   Type 2 diabetes mellitus with hyperglycemia, without long-term current use of insulin (HCC)   Atrial fibrillation (HCC)   UTI (urinary tract infection)  Resolved Problems:   * No resolved hospital problems. *  Hospital Course: 72 y.o. female with medical history significant of type 2 diabetes mellitus, TIA, seizure, GERD, nonalcoholic cirrhosis, GERD, and more presents the ED with a chief complaint of confusion and falls.  Head CT was unremarkable Started on IV Rocephin for UTI Cr elevated to 2.57, baseline 0.89  Assessment and Plan:    E. coli bacteremia -Blood culture growing E. coli, urine culture also growing E. Coli; sensitive to Rocephin -Patient was started on Rocephin in the hospital -E. coli sensitive to cefazolin, will discharge on cefadroxil 500 mg p.o. twice daily for 10 more days to complete 14 days of treatment   UTI -Urine culture grew E. Coli -Started on antibiotics as above  Metabolic encephalopathy -Resolved -Multifactorial in the setting of E. coli UTI as above, also has liver cirrhosis, -Ammonia level was 15   Acute kidney injury -Presented with creatinine of 2.57, improved to 1.28 today -Recommend to hold Lasix and potassium pills for next 3 days -Resume Entresto -Will need to check renal function in 3 days at PCP office     Hypotension -Resolved   Atrial fibrillation -Continue anticoagulation with warfarin -Continue amiodarone -INR is supratherapeutic at 4.4, warfarin on hold for next 3 days -Will check PT/INR at PCP office   Diabetes mellitus type 2 -Continue home regimen   Hypokalemia -Replete   NASH cirrhosis -Ammonia level 15 -Continue lactulose      Consultants:  Procedures performed:  Disposition: Home Diet recommendation:  Discharge Diet Orders (From admission, onward)     Start     Ordered   10/21/22 0000  Diet - low sodium heart healthy        10/21/22 1116           Regular diet DISCHARGE MEDICATION: Allergies as of 10/21/2022   No Known Allergies      Medication List     TAKE these medications    Accu-Chek Guide test strip Generic drug: glucose blood USE AS INSTRUCTED TO TEST BLOOD SUGAR DAILY AS DIRECTED.   Accu-Chek Softclix Lancets lancets check blood sugars twice daily DX E11.65   acetaminophen 500 MG tablet Commonly known as: TYLENOL Take 500 mg by mouth daily as needed for mild pain.   amiodarone 200 MG tablet Commonly known as: PACERONE Take 2 tablets (400 mg total) by mouth 2 (two) times daily for 9 days, THEN 1 tablet (200 mg total) daily. Start taking on: August 15, 2022   Azelastine HCl 0.15 % Soln   cefadroxil 500 MG capsule Commonly known as: DURICEF Take 1 capsule (500 mg total) by mouth 2 (two) times daily for 10 days.   citalopram 10 MG  tablet Commonly known as: CeleXA Take 1 tablet (10 mg total) by mouth daily.   Entresto 24-26 MG Generic drug: sacubitril-valsartan Take 1 tablet by mouth 2 (two) times daily.   fluticasone 50 MCG/ACT nasal spray Commonly known as: FLONASE Place 2 sprays into both nostrils daily. What changed:  when to take this reasons to take this   furosemide 40 MG tablet Commonly known as: LASIX Take 40 mg by mouth daily.   gabapentin 300 MG capsule Commonly known as: NEURONTIN Take 300 mg by mouth 3  (three) times daily as needed (neuropathy).   lactulose 10 GM/15ML solution Commonly known as: CHRONULAC Take 30 mLs (20 g total) by mouth 3 (three) times daily.   lamoTRIgine 200 MG tablet Commonly known as: LAMICTAL TAKE (1) TABLET TWICE DAILY. What changed: See the new instructions.   LORazepam 0.5 MG tablet Commonly known as: ATIVAN Take 1 tablet (0.5 mg total) by mouth 2 (two) times daily as needed for anxiety.   magic mouthwash (lidocaine, diphenhydrAMINE, alum & mag hydroxide) suspension Swish and swallow 5 mLs 3 (three) times daily as needed for mouth pain.   metFORMIN 500 MG tablet Commonly known as: GLUCOPHAGE Take 1 tablet (500 mg total) by mouth 2 (two) times daily with a meal.   metoprolol tartrate 25 MG tablet Commonly known as: LOPRESSOR Take 0.5 tablets (12.5 mg total) by mouth 2 (two) times daily.   montelukast 10 MG tablet Commonly known as: SINGULAIR Take 1 tablet (10 mg total) by mouth at bedtime.   Omron 3 Series BP Monitor Devi Use as directed   ondansetron 4 MG tablet Commonly known as: ZOFRAN take 1 tablet by mouth every 8 hours as needed for nausea and vomiting.   pantoprazole 40 MG tablet Commonly known as: PROTONIX TAKE (1) TABLET TWICE DAILY. What changed: See the new instructions.   potassium chloride SA 20 MEQ tablet Commonly known as: KLOR-CON M Take 20 mEq by mouth 2 (two) times daily.   traMADol 50 MG tablet Commonly known as: ULTRAM Take 50 mg by mouth every 6 (six) hours as needed.   traZODone 50 MG tablet Commonly known as: DESYREL Take 1 tablet (50 mg total) by mouth at bedtime as needed for sleep.   warfarin 5 MG tablet Commonly known as: Coumadin Take as directed. If you are unsure how to take this medication, talk to your nurse or doctor. Original instructions: Take 1 tablet (5 mg total) by mouth daily. Or as directed by [facility]. What changed: additional instructions        Follow-up Information     Care,  [facility] Follow up.   Specialty: Home Health Services Why: Will contact you to schedule home health visits. Contact information: [address] [place] [address] [phone]                Discharge Exam: Ceasar Mons Weights   10/17/22 0742  Weight: 68.4 kg   General-appears in no acute distress Heart-S1-S2, regular, no murmur auscultated Lungs-clear to auscultation bilaterally, no wheezing or crackles auscultated Abdomen-soft, nontender, no organomegaly Extremities-no edema in the lower extremities Neuro-alert, oriented x3, no focal deficit noted  Condition at discharge: good  The results of significant diagnostics from this hospitalization (including imaging, microbiology, ancillary and laboratory) are listed below for reference.   Imaging Studies: DG Chest Portable 1 View  Result Date: 10/16/2022 CLINICAL DATA:  Recent fall with altered mental status, initial encounter EXAM: PORTABLE CHEST 1 VIEW COMPARISON:  08/11/2022 FINDINGS: Cardiac shadow is enlarged but stable. Pacing device is again seen. The lungs are well aerated bilaterally. No focal infiltrate or effusion is seen. No bony abnormality is noted. Mild central vascular congestion is noted. IMPRESSION: Mild CHF without significant edema. Electronically Signed   By: Alcide Clever M.D.   On: 10/16/2022 20:49   CT Head Wo Contrast  Result Date: 10/16/2022 CLINICAL DATA:  Head trauma, moderate severe. EXAM: CT HEAD WITHOUT CONTRAST TECHNIQUE: Contiguous axial images were obtained from the base of the skull through the vertex without intravenous contrast. RADIATION DOSE REDUCTION: This exam was performed according to the departmental dose-optimization program which includes automated exposure control, adjustment of the mA and/or kV according to patient size and/or use of iterative reconstruction technique. COMPARISON:  Head CT 06/16/2017.  MRI brain 05/25/2017. FINDINGS: Brain: No evidence of acute  infarction, hemorrhage, hydrocephalus, extra-axial collection or mass lesion/mass effect. There is stable mild periventricular white matter hypodensity, likely chronic small vessel ischemic change. Vascular: No hyperdense vessel or unexpected calcification. Skull: Normal. Negative for fracture or focal lesion. Sinuses/Orbits: No acute finding. Other: None. IMPRESSION: No acute intracranial process. Stable mild chronic small vessel ischemic change. Electronically Signed   By: Darliss Cheney M.D.   On: 10/16/2022 20:37   VAS US CAROTID  Result Date: 10/14/2022 Carotid Arterial Duplex Study Patient Name:  ABBIEGAIL HAMPEL  Date of Exam:   10/14/2022 Medical Rec #: [id number]            Accession #:    [id number] Date of Birth: 04/21/1950            Patient Gender: F Patient Age:   62 years Exam Location:  [facility] Procedure:      VAS US CAROTID Referring Phys: [employer] COLLINS --------------------------------------------------------------------------------  Indications:       Carotid artery disease. Risk Factors:      Hypertension, hyperlipidemia, Diabetes, no history of                    smoking, PAD. Comparison Study:  10/06/2020 Carotid artery duplex- Right Carotid: Velocities in                    the right ICA are consistent with a 1-39% stenosis.                     Left Carotid: Velocities in the left ICA are consistent with                    a 1-39% stenosis. Performing Technologist: Gertie Fey MHA, RDMS, RVT, RDCS  Examination Guidelines: A complete evaluation includes B-mode imaging, spectral Doppler, color Doppler, and power Doppler as needed of all accessible portions of each vessel. Bilateral testing is considered an integral part of a complete examination. Limited examinations for reoccurring indications may be performed as noted.  Right Carotid Findings: +----------+--------+--------+--------+-----------------------+--------+           PSV cm/sEDV cm/sStenosisPlaque  Description     Comments +----------+--------+--------+--------+-----------------------+--------+ CCA Prox  89      12                                              +----------+--------+--------+--------+-----------------------+--------+ CCA Distal76      11                                              +----------+--------+--------+--------+-----------------------+--------+  ICA Prox  81      27              heterogenous                    +----------+--------+--------+--------+-----------------------+--------+ ICA Mid   81      23                                              +----------+--------+--------+--------+-----------------------+--------+ ICA Distal62      16                                              +----------+--------+--------+--------+-----------------------+--------+ ECA       59                      heterogenous and smooth         +----------+--------+--------+--------+-----------------------+--------+ +----------+--------+-------+----------------+-------------------+           PSV cm/sEDV cmsDescribe        Arm Pressure (mmHG) +----------+--------+-------+----------------+-------------------+ [id number]            Multiphasic, [id number]                 +----------+--------+-------+----------------+-------------------+ +---------+--------+--+--------+--+---------+ VertebralPSV cm/s43EDV cm/s10Antegrade +---------+--------+--+--------+--+---------+  Left Carotid Findings: +---------+--------+-------+--------+---------------------------------+--------+          PSV cm/sEDV    StenosisPlaque Description               Comments                  cm/s                                                     +---------+--------+-------+--------+---------------------------------+--------+ CCA Prox 109     16                                                        +---------+--------+-------+--------+---------------------------------+--------+ CCA      84      15                                                       Distal                                                                    +---------+--------+-------+--------+---------------------------------+--------+ ICA Prox 71      17             heterogenous, irregular and  calcific                                  +---------+--------+-------+--------+---------------------------------+--------+ ICA Mid  59      15                                                       +---------+--------+-------+--------+---------------------------------+--------+ ICA      71      17                                                       Distal                                                                    +---------+--------+-------+--------+---------------------------------+--------+ ECA      91      9                                                        +---------+--------+-------+--------+---------------------------------+--------+ +----------+--------+--------+----------------+-------------------+           PSV cm/sEDV cm/sDescribe        Arm Pressure (mmHG) +----------+--------+--------+----------------+-------------------+ [id number]             Multiphasic, [id number]                 +----------+--------+--------+----------------+-------------------+ +---------+--------+--+--------+--+---------+ VertebralPSV cm/s65EDV cm/s10Antegrade +---------+--------+--+--------+--+---------+   Summary: Right Carotid: Velocities in the right ICA are consistent with a 1-39% stenosis. Left Carotid: Velocities in the left ICA are consistent with a 1-39% stenosis. Vertebrals:  Bilateral vertebral arteries demonstrate antegrade flow. Subclavians: Normal flow hemodynamics were seen in bilateral subclavian              arteries.  *See table(s) above for measurements and observations.  Electronically signed by Coral Else MD on 10/14/2022 at 3:43:15 PM.    Final    US ABDOMEN LIMITED RUQ (LIVER/GB)  Result Date: 10/14/2022 CLINICAL DATA:  Liver cirrhosis, [facility] follow-up. EXAM: ULTRASOUND ABDOMEN LIMITED RIGHT UPPER QUADRANT COMPARISON:  April 05, 2022 FINDINGS: Gallbladder: Surgically removed. Common bile duct: Diameter: 2 mm. Liver: Nodular contour with increased echotexture. No focal liver lesion. Portal vein is patent on color Doppler imaging with normal direction of blood flow towards the liver. Other: None. IMPRESSION: Cirrhosis of the liver. No focal liver lesion identified. Electronically Signed   By: Sherian Rein M.D.   On: 10/14/2022 08:47    Microbiology: Results for orders placed or performed during the hospital encounter of 10/16/22  Culture, blood (routine x 2)     Status: Abnormal   Collection Time: 10/16/22  9:13 PM   Specimen: BLOOD  Result Value Ref Range Status   Specimen Description   Final  BLOOD BLOOD RIGHT HAND Performed at [facility], [address], [place], [place] [address]    Special Requests   Final    BOTTLES DRAWN AEROBIC AND ANAEROBIC Blood Culture adequate volume Performed at [facility], [address] [place], [place] [address]    Culture  Setup Time   Final    GRAM NEGATIVE RODS IN BOTH AEROBIC AND ANAEROBIC BOTTLES Gram Stain Report Called to,Read Back By and Verified With: MILLS,M. AT 0921 ON 10/17/2022 BY FRATTO,A. Organism ID to follow Performed at [facility], [address] [place], [place] [address]    Culture ESCHERICHIA COLI (A)  Final   Report Status 10/19/2022 FINAL  Final   Organism ID, Bacteria ESCHERICHIA COLI  Final      Susceptibility   Escherichia coli - MIC*    AMPICILLIN <=2 SENSITIVE Sensitive     CEFEPIME <=0.12 SENSITIVE Sensitive     CEFTAZIDIME <=1 SENSITIVE Sensitive     CEFTRIAXONE <=0.25 SENSITIVE Sensitive     CIPROFLOXACIN  <=0.25 SENSITIVE Sensitive     GENTAMICIN <=1 SENSITIVE Sensitive     IMIPENEM <=0.25 SENSITIVE Sensitive     TRIMETH/SULFA <=20 SENSITIVE Sensitive     AMPICILLIN/SULBACTAM <=2 SENSITIVE Sensitive     PIP/TAZO <=4 SENSITIVE Sensitive     * ESCHERICHIA COLI  Culture, blood (routine x 2)     Status: Abnormal   Collection Time: 10/16/22  9:13 PM   Specimen: BLOOD  Result Value Ref Range Status   Specimen Description   Final    BLOOD LEFT ANTECUBITAL Performed at [facility], [address], [place], [place] [address]    Special Requests   Final    BOTTLES DRAWN AEROBIC AND ANAEROBIC Blood Culture results may not be optimal due to an excessive volume of blood received in culture bottles Performed at [facility], [address] [place], [place] [address]    Culture  Setup Time   Final    GRAM NEGATIVE RODS IN BOTH AEROBIC AND ANAEROBIC BOTTLES Gram Stain Report Called to,Read Back By and Verified With: MILLS, M. AT 0922 ON 10/17/2022 BY FRATTO,A.    Culture (A)  Final    ESCHERICHIA COLI SUSCEPTIBILITIES PERFORMED ON PREVIOUS CULTURE WITHIN THE LAST 5 DAYS. Performed at [facility], [address], [place], [place] [address]    Report Status 10/19/2022 FINAL  Final  Blood Culture ID Panel (Reflexed)     Status: Abnormal   Collection Time: 10/16/22  9:13 PM  Result Value Ref Range Status   Enterococcus faecalis NOT DETECTED NOT DETECTED Final   Enterococcus Faecium NOT DETECTED NOT DETECTED Final   Listeria monocytogenes NOT DETECTED NOT DETECTED Final   Staphylococcus species NOT DETECTED NOT DETECTED Final   Staphylococcus aureus (BCID) NOT DETECTED NOT DETECTED Final   Staphylococcus epidermidis NOT DETECTED NOT DETECTED Final   Staphylococcus lugdunensis NOT DETECTED NOT DETECTED Final   Streptococcus species NOT DETECTED NOT DETECTED Final   Streptococcus agalactiae NOT DETECTED NOT DETECTED Final   Streptococcus pneumoniae NOT DETECTED NOT DETECTED Final    Streptococcus pyogenes NOT DETECTED NOT DETECTED Final   A.calcoaceticus-baumannii NOT DETECTED NOT DETECTED Final   Bacteroides fragilis NOT DETECTED NOT DETECTED Final   Enterobacterales DETECTED (A) NOT DETECTED Final    Comment: Enterobacterales represent a large order of gram negative bacteria, not a single organism. CRITICAL RESULT CALLED TO, READ BACK BY AND VERIFIED WITHErmalene Searing PHARMD, AT 1404 10/17/22 D. Leighton Roach  Enterobacter cloacae complex NOT DETECTED NOT DETECTED Final   Escherichia coli DETECTED (A) NOT DETECTED Final    Comment: CRITICAL RESULT CALLED TO, READ BACK BY AND VERIFIED WITH: S. HURTH PHARMD, AT 1404 10/17/22 D. VANHOOK    Klebsiella aerogenes NOT DETECTED NOT DETECTED Final   Klebsiella oxytoca NOT DETECTED NOT DETECTED Final   Klebsiella pneumoniae NOT DETECTED NOT DETECTED Final   Proteus species NOT DETECTED NOT DETECTED Final   Salmonella species NOT DETECTED NOT DETECTED Final   Serratia marcescens NOT DETECTED NOT DETECTED Final   Haemophilus influenzae NOT DETECTED NOT DETECTED Final   Neisseria meningitidis NOT DETECTED NOT DETECTED Final   Pseudomonas aeruginosa NOT DETECTED NOT DETECTED Final   Stenotrophomonas maltophilia NOT DETECTED NOT DETECTED Final   Candida albicans NOT DETECTED NOT DETECTED Final   Candida auris NOT DETECTED NOT DETECTED Final   Candida glabrata NOT DETECTED NOT DETECTED Final   Candida krusei NOT DETECTED NOT DETECTED Final   Candida parapsilosis NOT DETECTED NOT DETECTED Final   Candida tropicalis NOT DETECTED NOT DETECTED Final   Cryptococcus neoformans/gattii NOT DETECTED NOT DETECTED Final   CTX-M ESBL NOT DETECTED NOT DETECTED Final   Carbapenem resistance IMP NOT DETECTED NOT DETECTED Final   Carbapenem resistance KPC NOT DETECTED NOT DETECTED Final   Carbapenem resistance NDM NOT DETECTED NOT DETECTED Final   Carbapenem resist OXA 48 LIKE NOT DETECTED NOT DETECTED Final   Carbapenem resistance VIM NOT DETECTED  NOT DETECTED Final    Comment: Performed at [facility], [address] [place], [place] [address]  Urine Culture     Status: Abnormal   Collection Time: 10/16/22  9:26 PM   Specimen: Urine, Random  Result Value Ref Range Status   Specimen Description   Final    URINE, RANDOM Performed at [facility], [address], [place], [place] [address]    Special Requests   Final    NONE Reflexed from [id number] Performed at [facility], [address] [place], [place] [address]    Culture >=100,000 COLONIES/mL ESCHERICHIA COLI (A)  Final   Report Status 10/18/2022 FINAL  Final   Organism ID, Bacteria ESCHERICHIA COLI (A)  Final      Susceptibility   Escherichia coli - MIC*    AMPICILLIN <=2 SENSITIVE Sensitive     CEFAZOLIN <=4 SENSITIVE Sensitive     CEFEPIME <=0.12 SENSITIVE Sensitive     CEFTRIAXONE <=0.25 SENSITIVE Sensitive     CIPROFLOXACIN <=0.25 SENSITIVE Sensitive     GENTAMICIN <=1 SENSITIVE Sensitive     IMIPENEM <=0.25 SENSITIVE Sensitive     NITROFURANTOIN <=16 SENSITIVE Sensitive     TRIMETH/SULFA <=20 SENSITIVE Sensitive     AMPICILLIN/SULBACTAM <=2 SENSITIVE Sensitive     PIP/TAZO <=4 SENSITIVE Sensitive     * >=100,000 COLONIES/mL ESCHERICHIA COLI    Labs: CBC: Recent Labs  Lab 10/16/22 1810 10/17/22 0333 10/18/22 0400 10/19/22 0449 10/21/22 0428  WBC 28.6* 20.8* 17.5* 21.0* 21.4*  NEUTROABS  --  16.4*  --   --   --   HGB 11.6* 10.6* 10.4* 11.0* 10.2*  HCT 34.5* 31.1* 30.1* 31.9* 30.1*  MCV 91.3 88.9 88.0 87.6 89.1  PLT 113* 101* 105* 126* 174   Basic Metabolic Panel: Recent Labs  Lab 10/17/22 0333 10/18/22 0400 10/19/22 0449 10/20/22 0919 10/21/22 0428  NA 130* 133* 137 140 137  K 4.1 3.0* 3.0* 3.4* 3.5  CL 98 105 109 111 113*  CO2 21* 19* 18*  20* 17*  GLUCOSE 163* 143* 146* 182* 129*  BUN 55* 46* 35* 27* 21  CREATININE 2.17* 1.69* 1.44* 1.40* 1.28*  CALCIUM 8.8* 8.6* 8.5* 8.4* 8.2*  MG 2.1  --   --   --   --    Liver Function  Tests: Recent Labs  Lab 10/16/22 1810 10/17/22 0333 10/18/22 0400  AST 63* 56* 80*  ALT 34 33 41  ALKPHOS 86 74 80  BILITOT 1.2 1.3* 1.2  PROT 6.4* 6.1* 5.7*  ALBUMIN 3.3* 2.9* 2.6*   CBG: Recent Labs  Lab 10/20/22 1104 10/20/22 1642 10/20/22 2114 10/21/22 0817 10/21/22 1101  GLUCAP 135* 113* 121* 163* 138*    Discharge time spent: greater than 30 minutes.  Signed: [name] Ide, MD [facility] 10/21/2022

## 2022-10-22 ENCOUNTER — Telehealth: Payer: Self-pay | Admitting: Family Medicine

## 2022-10-22 NOTE — Telephone Encounter (Signed)
Pts daughter called stating that pt needs a HFU with PCP to have her Coumadin rechecked by 9/5. PCP does not have any openings  this week. Can nurse work her in for HFU with PCP? Daughter prefers later afternoon appt since she will be bringing pt and she works.

## 2022-10-22 NOTE — Telephone Encounter (Signed)
Pt is scheduled to for HFU tomorrow with PCP.

## 2022-10-23 ENCOUNTER — Telehealth: Payer: Self-pay | Admitting: Family Medicine

## 2022-10-23 ENCOUNTER — Ambulatory Visit (INDEPENDENT_AMBULATORY_CARE_PROVIDER_SITE_OTHER): Payer: Medicare HMO | Admitting: Family Medicine

## 2022-10-23 ENCOUNTER — Encounter: Payer: Self-pay | Admitting: Family Medicine

## 2022-10-23 VITALS — BP 101/56 | HR 64 | Temp 97.7°F | Ht 64.0 in | Wt 151.4 lb

## 2022-10-23 DIAGNOSIS — E876 Hypokalemia: Secondary | ICD-10-CM | POA: Diagnosis not present

## 2022-10-23 DIAGNOSIS — K7460 Unspecified cirrhosis of liver: Secondary | ICD-10-CM | POA: Diagnosis not present

## 2022-10-23 DIAGNOSIS — G9341 Metabolic encephalopathy: Secondary | ICD-10-CM | POA: Diagnosis not present

## 2022-10-23 DIAGNOSIS — Z7901 Long term (current) use of anticoagulants: Secondary | ICD-10-CM

## 2022-10-23 DIAGNOSIS — N179 Acute kidney failure, unspecified: Secondary | ICD-10-CM | POA: Diagnosis not present

## 2022-10-23 DIAGNOSIS — N3000 Acute cystitis without hematuria: Secondary | ICD-10-CM | POA: Diagnosis not present

## 2022-10-23 DIAGNOSIS — I5022 Chronic systolic (congestive) heart failure: Secondary | ICD-10-CM | POA: Diagnosis not present

## 2022-10-23 DIAGNOSIS — Z5181 Encounter for therapeutic drug level monitoring: Secondary | ICD-10-CM | POA: Diagnosis not present

## 2022-10-23 DIAGNOSIS — I4891 Unspecified atrial fibrillation: Secondary | ICD-10-CM

## 2022-10-23 DIAGNOSIS — Z09 Encounter for follow-up examination after completed treatment for conditions other than malignant neoplasm: Secondary | ICD-10-CM

## 2022-10-23 LAB — POCT INR: INR: 2.3 (ref 2.0–3.0)

## 2022-10-23 LAB — COAGUCHEK XS/INR WAIVED
INR: 2.3 — ABNORMAL HIGH (ref 0.9–1.1)
Prothrombin Time: 27.4 s

## 2022-10-23 NOTE — Telephone Encounter (Signed)
Noted! Thank you

## 2022-10-23 NOTE — Telephone Encounter (Signed)
FYI

## 2022-10-23 NOTE — Progress Notes (Signed)
Established Patient Office Visit  Subjective   [name], female    DOB: [birth year]  Age: 72 y.o. MRN: [id number]  Chief Complaint  Patient presents with   Hospitalization Follow-up    HPI Martia is here for a hospital follow up with her daughter today. She was admitted on 10/16/22 at [facility] after presenting with confusion and falls. She was discharged home on 10/21/22. Primary dx was AKI secondary to acute UTI. She was treated with IV Rocephin and discharged home with cefadroxil for 10 days. Her INR before discharge was 4.4. She was instructed to hold coumadin for 3 days until follow up to recheck INR. She was also instructed to hold lasix and potassium for 3 days due to kidney function. Potassium as low on admission and was replete. Dx of acute metabolic encephalopathy likely due to UTI. She had an unremarkable head CT. Ammonia level was normal at discharged. Home PT has been ordered. Currently using a walker for ambulation. Home PT will call in 2 days to schedule evaluation. Denies falls since discharge.    She reports feeling better since discharge. Denies fever, chills, urinary symptoms. She has been compliant with abx treatment. She has been feeling nauseous intermittent, improving since discharge. Denies vomiting.      ROS As per HPI.    Objective:     BP (!) 101/56   Pulse 64   Temp 97.7 F (36.5 C) (Temporal)   Ht 5\' 4"  (1.626 m)   Wt 151 lb 6 oz (68.7 kg)   SpO2 99%   BMI 25.98 kg/m    Physical Exam Vitals and nursing note reviewed.  Constitutional:      General: She is not in acute distress.    Appearance: She is not ill-appearing, toxic-appearing or diaphoretic.  HENT:     Head: Normocephalic and atraumatic.     Mouth/Throat:     Mouth: Mucous membranes are moist.     Pharynx: Oropharynx is clear.  Eyes:     General: No scleral icterus. Cardiovascular:     Rate and Rhythm: Normal rate and regular rhythm.     Heart sounds: Normal heart  sounds. No murmur heard. Pulmonary:     Effort: Pulmonary effort is normal. No respiratory distress.     Breath sounds: Normal breath sounds. No wheezing.  Abdominal:     General: Bowel sounds are normal. There is no distension.     Palpations: Abdomen is soft.     Tenderness: There is no abdominal tenderness. There is no guarding.  Musculoskeletal:     Cervical back: Neck supple. No rigidity.     Right lower leg: No edema.     Left lower leg: No edema.  Skin:    General: Skin is warm and dry.     Coloration: Skin is not jaundiced.  Neurological:     Mental Status: She is alert and oriented to person, place, and time.     Gait: Gait abnormal (using rolling walker).  Psychiatric:        Mood and Affect: Mood normal.        Behavior: Behavior normal.      No results found for any visits on 10/23/22.    The 10-year ASCVD risk score (Arnett DK, et al., 2019) is: 15.6%    Assessment & Plan:   Brandin was seen today for hospitalization follow-up.  Diagnoses and all orders for this visit:  AKI (acute kidney injury) (HCC) Labs pending. Continue  to hold lasix pending labs.  -     CMP14+EGFR  Acute cystitis without hematuria Denies symptoms. Complete abx as ordered.   Acute metabolic encephalopathy Due to UTI. Denies symptoms.   Atrial fibrillation, unspecified type [facility]) Encounter for therapeutic drug monitoring Long term current use of anticoagulant Description   INR 2.3 today. At goal of 2-3.  Continue warfarin 1 tablet daily except 1/2 tablet on Mondays, Wednesdays and Fridays.  Follow up with coumadin clinic as scheduled.      -     CoaguChek XS/INR Waived  Hypokalemia Repleted in hospital. Supplement held on discharge pending labs.  -     CMP14+EGFR  Chronic HFrEF (heart failure with reduced ejection fraction) (HCC) Euvolemic on exam.   Non-alcoholic cirrhosis (HCC) CMP pending.   Hospital discharge follow up Reviewed discharge records, labs.    Return if symptoms worsen or fail to improve. Keep scheduled follow up appts.   The patient indicates understanding of these issues and agrees with the plan.   Gabriel Earing, FNP

## 2022-10-24 ENCOUNTER — Telehealth: Payer: Self-pay

## 2022-10-24 LAB — CMP14+EGFR
ALT: 31 IU/L (ref 0–32)
AST: 37 IU/L (ref 0–40)
Albumin: 3.3 g/dL — ABNORMAL LOW (ref 3.9–4.9)
Alkaline Phosphatase: 163 IU/L — ABNORMAL HIGH (ref 44–121)
BUN/Creatinine Ratio: 11 — ABNORMAL LOW (ref 12–28)
BUN: 12 mg/dL (ref 8–27)
Bilirubin Total: 0.5 mg/dL (ref 0.0–1.2)
CO2: 17 mmol/L — ABNORMAL LOW (ref 20–29)
Calcium: 8.1 mg/dL — ABNORMAL LOW (ref 8.7–10.3)
Chloride: 107 mmol/L — ABNORMAL HIGH (ref 96–106)
Creatinine, Ser: 1.08 mg/dL — ABNORMAL HIGH (ref 0.57–1.00)
Globulin, Total: 2.9 g/dL (ref 1.5–4.5)
Glucose: 107 mg/dL — ABNORMAL HIGH (ref 70–99)
Potassium: 3.8 mmol/L (ref 3.5–5.2)
Sodium: 138 mmol/L (ref 134–144)
Total Protein: 6.2 g/dL (ref 6.0–8.5)
eGFR: 55 mL/min/{1.73_m2} — ABNORMAL LOW (ref >59)

## 2022-10-24 NOTE — Transitions of Care (Post Inpatient/ED Visit) (Signed)
   10/24/2022  Name: Carol Richmond MRN: [id number] DOB: 03-16-1950  Today's TOC FU Call Status: Today's TOC FU Call Status:: Unsuccessful Call (1st Attempt) Unsuccessful Call (1st Attempt) Date: 10/24/22  Attempted to reach the patient regarding the most recent Inpatient/ED visit.  Follow Up Plan: Additional outreach attempts will be made to reach the patient to complete the Transitions of Care (Post Inpatient/ED visit) call.   Jodelle Gross RN, BSN, CCM [facility] RN Care Coordinator/ Transitions of Care Direct Dial: [phone]  Fax: [phone]

## 2022-10-25 ENCOUNTER — Telehealth: Payer: Self-pay | Admitting: Family Medicine

## 2022-10-25 ENCOUNTER — Telehealth: Payer: Self-pay

## 2022-10-25 ENCOUNTER — Other Ambulatory Visit (HOSPITAL_COMMUNITY): Payer: Self-pay

## 2022-10-25 NOTE — Telephone Encounter (Signed)
Calling back and wants Carol Richmond to call back.

## 2022-10-25 NOTE — Transitions of Care (Post Inpatient/ED Visit) (Signed)
10/25/2022  Name: Carol Richmond MRN: [id number] DOB: 07/24/1950  Today's TOC FU Call Status: Today's TOC FU Call Status:: Successful TOC FU Call Completed TOC FU Call Complete Date: 10/25/22 Patient's Name and Date of Birth confirmed.  Transition Care Management Follow-up Telephone Call Date of Discharge: 10/21/22 [facility] Type of Discharge: Inpatient Admission Primary Inpatient Discharge Diagnosis:: Acute Kidney Injury How have you been since you were released from the hospital?: Better Any questions or concerns?: No  Items Reviewed: Did you receive and understand the discharge instructions provided?: Yes Medications obtained,verified, and reconciled?: Yes (Medications Reviewed) Any new allergies since your discharge?: No Dietary orders reviewed?: Yes Type of Diet Ordered:: Low Sodium, Heart Healthy Do you have support at home?: No  Medications Reviewed Today: Medications Reviewed Today     Reviewed by Jodelle Gross, RN (Case Manager) on 10/25/22 at 0935  Med List Status: <None>   Medication Order Taking? Sig Documenting Provider Last Dose Status Informant  Accu-Chek Softclix Lancets lancets [id number] Yes check blood sugars twice daily DX E11.65 Gabriel Earing, FNP Taking Active Self, Pharmacy Records  acetaminophen (TYLENOL) 500 MG tablet [id number] Yes Take 500 mg by mouth daily as needed for mild pain. [provider] Taking Active Self, Pharmacy Records  amiodarone (PACERONE) 200 MG tablet [id number] Yes Take 2 tablets (400 mg total) by mouth 2 (two) times daily for 9 days, THEN 1 tablet (200 mg total) daily. Perlie Gold, PA-C Taking Active Self, Pharmacy Records  Azelastine HCl 0.15 % SOLN [id number] Yes  [provider] Taking Active Self, Pharmacy Records  Blood Pressure Monitoring (OMRON 3 SERIES BP MONITOR) DEVI [id number] Yes Use as directed Sharlene Dory, NP Taking Active Self, Pharmacy Records  cefadroxil  (DURICEF) 500 MG capsule [id number] Yes Take 1 capsule (500 mg total) by mouth 2 (two) times daily for 10 days. [name] Ide, MD Taking Active   citalopram (CELEXA) 10 MG tablet [id number] Yes Take 1 tablet (10 mg total) by mouth daily. Arrie Senate, FNP Taking Active Self, Pharmacy Records  fluticasone [facility]) 50 MCG/ACT nasal spray [id number] Yes Place 2 sprays into both nostrils daily.  Patient taking differently: Place 2 sprays into both nostrils daily as needed for allergies.   Junie Spencer, FNP Taking Active Self, Pharmacy Records  furosemide (LASIX) 40 MG tablet [id number] Yes Take 40 mg by mouth daily. [provider] Taking Active Self, Pharmacy Records  gabapentin (NEURONTIN) 300 MG capsule [id number] Yes Take 300 mg by mouth 3 (three) times daily as needed (neuropathy). [provider] Taking Active Self, Pharmacy Records  glucose blood (ACCU-CHEK GUIDE) test strip [id number] Yes USE AS INSTRUCTED TO TEST BLOOD SUGAR DAILY AS DIRECTED. Gabriel Earing, FNP Taking Active Self, Pharmacy Records  lactulose [facility]) 10 GM/15ML solution [id number] Yes Take 30 mLs (20 g total) by mouth 3 (three) times daily. Dolores Frame, MD Taking Active Self, Pharmacy Records  lamoTRIgine (LAMICTAL) 200 MG tablet [id number] Yes TAKE (1) TABLET TWICE DAILY.  Patient taking differently: Take 200 mg by mouth 2 (two) times daily.   Gabriel Earing, FNP Taking Active Self, Pharmacy Records  LORazepam (ATIVAN) 0.5 MG tablet [id number] Yes Take 1 tablet (0.5 mg total) by mouth 2 (two) times daily as needed for anxiety. Gabriel Earing, FNP Taking Active Self, Pharmacy Records  magic mouthwash (lidocaine, diphenhydrAMINE, alum & mag hydroxide) suspension [id number] Yes Swish and swallow 5 mLs 3 (three) times daily as needed for mouth pain.  Marguerita Merles, Reuel Boom, MD Taking Active Self, Pharmacy Records  metFORMIN (GLUCOPHAGE) 500 MG tablet [id number] Yes Take 1 tablet  (500 mg total) by mouth 2 (two) times daily with a meal. Gabriel Earing, FNP Taking Active Self, Pharmacy Records  metoprolol tartrate (LOPRESSOR) 25 MG tablet [id number] Yes Take 0.5 tablets (12.5 mg total) by mouth 2 (two) times daily. Perlie Gold, PA-C Taking Active Self, Pharmacy Records  montelukast (SINGULAIR) 10 MG tablet [id number] Yes Take 1 tablet (10 mg total) by mouth at bedtime. Gabriel Earing, FNP Taking Active Self, Pharmacy Records  ondansetron [facility]) 4 MG tablet [id number] Yes take 1 tablet by mouth every 8 hours as needed for nausea and vomiting. Dolores Frame, MD Taking Active Self, Pharmacy Records  pantoprazole (PROTONIX) 40 MG tablet [id number] Yes TAKE (1) TABLET TWICE DAILY.  Patient taking differently: Take 40 mg by mouth 2 (two) times daily.   Raquel James, NP Taking Active Self, Pharmacy Records  potassium chloride SA (KLOR-CON M) 20 MEQ tablet [id number] Yes Take 20 mEq by mouth 2 (two) times daily. [provider] Taking Active Self, Pharmacy Records  sacubitril-valsartan [facility]) 24-26 MG [id number] Yes Take 1 tablet by mouth 2 (two) times daily. Jonelle Sidle, MD Taking Active Self, Pharmacy Records  traMADol [facility] Sark) 50 MG tablet [id number] Yes Take 50 mg by mouth every 6 (six) hours as needed. [provider] Taking Active Self, Pharmacy Records  traZODone (DESYREL) 50 MG tablet [id number] Yes Take 1 tablet (50 mg total) by mouth at bedtime as needed for sleep. Gabriel Earing, FNP Taking Active Self, Pharmacy Records  warfarin (COUMADIN) 5 MG tablet [id number] Yes Take 1 tablet (5 mg total) by mouth daily. Or as directed by Coumadin Clinic.  Patient taking differently: Take 5 mg by mouth daily. Or as directed by Coumadin Clinic.On Monday,Wednesday and Friday take 2.5 mg daily then on all other days take 5 mg daily   Perlie Gold, PA-C Taking Active Self, Pharmacy Records            Home Care and  Equipment/Supplies: Were Home Health Services Ordered?: Yes [facility] of [facility] Health Agency[facility] Has Agency set up a time to come to your home?: Yes First Home Health Visit Date: 10/24/22 Any new equipment or medical supplies ordered?: No  Functional Questionnaire: Do you need assistance with bathing/showering or dressing?: No Do you need assistance with meal preparation?: No Do you need assistance with eating?: No Do you have difficulty maintaining continence: No Do you need assistance with getting out of bed/getting out of a chair/moving?: No Do you have difficulty managing or taking your medications?: No  Follow up appointments reviewed: PCP Follow-up appointment confirmed?: Yes Date of PCP follow-up appointment?: 10/23/22 Follow-up Provider: Harlow Mares, FNP Specialist Hospital Follow-up appointment confirmed?: NA Do you need transportation to your follow-up appointment?: No Do you understand care options if your condition(s) worsen?: Yes-patient verbalized understanding  SDOH Interventions Today    Flowsheet Row Most Recent Value  SDOH Interventions   Food Insecurity Interventions Intervention Not Indicated  Housing Interventions Intervention Not Indicated  Transportation Interventions Intervention Not Indicated  Utilities Interventions Intervention Not Indicated     Jodelle Gross RN, BSN, CCM [facility] RN Care Coordinator/ Transitions of Care Direct Dial: [phone]  Fax: [phone]

## 2022-10-28 ENCOUNTER — Inpatient Hospital Stay: Admission: RE | Admit: 2022-10-28 | Payer: Medicare HMO | Source: Ambulatory Visit

## 2022-10-28 ENCOUNTER — Ambulatory Visit: Payer: Medicare HMO

## 2022-10-28 ENCOUNTER — Encounter: Payer: Self-pay | Admitting: Family Medicine

## 2022-10-28 ENCOUNTER — Ambulatory Visit (INDEPENDENT_AMBULATORY_CARE_PROVIDER_SITE_OTHER): Payer: Medicare HMO | Admitting: Family Medicine

## 2022-10-28 VITALS — BP 110/64 | HR 68 | Temp 98.1°F | Ht 64.0 in | Wt 146.4 lb

## 2022-10-28 DIAGNOSIS — F1320 Sedative, hypnotic or anxiolytic dependence, uncomplicated: Secondary | ICD-10-CM | POA: Diagnosis not present

## 2022-10-28 DIAGNOSIS — R569 Unspecified convulsions: Secondary | ICD-10-CM

## 2022-10-28 DIAGNOSIS — E1159 Type 2 diabetes mellitus with other circulatory complications: Secondary | ICD-10-CM | POA: Diagnosis not present

## 2022-10-28 DIAGNOSIS — E1165 Type 2 diabetes mellitus with hyperglycemia: Secondary | ICD-10-CM | POA: Diagnosis not present

## 2022-10-28 DIAGNOSIS — Z23 Encounter for immunization: Secondary | ICD-10-CM

## 2022-10-28 DIAGNOSIS — F339 Major depressive disorder, recurrent, unspecified: Secondary | ICD-10-CM | POA: Diagnosis not present

## 2022-10-28 DIAGNOSIS — H348112 Central retinal vein occlusion, right eye, stable: Secondary | ICD-10-CM | POA: Diagnosis not present

## 2022-10-28 DIAGNOSIS — I4891 Unspecified atrial fibrillation: Secondary | ICD-10-CM | POA: Diagnosis not present

## 2022-10-28 DIAGNOSIS — I152 Hypertension secondary to endocrine disorders: Secondary | ICD-10-CM

## 2022-10-28 DIAGNOSIS — F419 Anxiety disorder, unspecified: Secondary | ICD-10-CM

## 2022-10-28 DIAGNOSIS — Z79899 Other long term (current) drug therapy: Secondary | ICD-10-CM

## 2022-10-28 DIAGNOSIS — I5022 Chronic systolic (congestive) heart failure: Secondary | ICD-10-CM

## 2022-10-28 DIAGNOSIS — I70211 Atherosclerosis of native arteries of extremities with intermittent claudication, right leg: Secondary | ICD-10-CM | POA: Insufficient documentation

## 2022-10-28 DIAGNOSIS — E1169 Type 2 diabetes mellitus with other specified complication: Secondary | ICD-10-CM

## 2022-10-28 LAB — BAYER DCA HB A1C WAIVED: HB A1C (BAYER DCA - WAIVED): 5.9 % — ABNORMAL HIGH (ref 4.8–5.6)

## 2022-10-28 NOTE — Progress Notes (Addendum)
Established Patient Office Visit  Subjective   [name], female    DOB: 02/29/1950  Age: 72 y.o. MRN: [id number]  Chief Complaint  Patient presents with   Medical Management of Chronic Issues   Diabetes    HPI  DM Pt presents for follow up evaluation of Type 2 diabetes mellitus. Patient denies foot ulcerations, increased appetite, nausea, paresthesia of the feet, polydipsia, polyuria, visual disturbances, vomiting, and weight loss.  Current diabetic medications include: metformin 500 mg BID Compliant with meds - Yes  Current monitoring regimen: not checking Home blood sugar records: not checking currently Any episodes of hypoglycemia? no  2. HTN/ A fib/ CHF Complaint with meds - Yes Current Medications - amiodarone, lasix, metoprolol, entreso, coumadin Pertinent ROS:  Headache - No Fatigue - No Visual Disturbances - No Chest pain - No Dyspnea - No Palpitations - No LE edema - No  3.  HLD She has declined medications. Regular diet. She has been doing home PT.   4. Seizure disorder Compliant with lamictal. She hasn't had any seizure activity in many years.   5. Anxiety/depression Compliant with celexa. Taking ativan BID prn. Stable symptoms. Denies SI.      10/28/2022   10:23 AM 10/23/2022    1:30 PM 09/25/2022   11:08 AM  Depression screen PHQ 2/9  Decreased Interest 0 0 0  Down, Depressed, Hopeless 0 0 0  PHQ - 2 Score 0 0 0  Altered sleeping 0 1 0  Tired, decreased energy 0 0 0  Change in appetite 0 0 0  Feeling bad or failure about yourself  0 0 0  Trouble concentrating 0 0 0  Moving slowly or fidgety/restless 0 0 0  Suicidal thoughts 0 0 0  PHQ-9 Score 0 1 0  Difficult doing work/chores Not difficult at all Not difficult at all Not difficult at all      10/28/2022   10:22 AM 10/23/2022    1:31 PM 09/20/2022   10:12 AM 08/28/2022    8:05 AM  GAD 7 : Generalized Anxiety Score  Nervous, Anxious, on Edge 0 0 3 0  Control/stop worrying  0 0 0 0  Worry too much - different things 0 0 0 0  Trouble relaxing 0 0 2 0  Restless 0 0 1 0  Easily annoyed or irritable 0 0 0 0  Afraid - awful might happen 0 0 0 0  Total GAD 7 Score 0 0 6 0  Anxiety Difficulty Not difficult at all Not difficult at all Very difficult Not difficult at all      Past Medical History:  Diagnosis Date   Asthma    AVNRT (AV nodal re-entry tachycardia)    Carotid artery disease (HCC)    Cirrhosis (HCC)    Diverticulosis of colon (without mention of hemorrhage)    Essential hypertension    Functional ovarian cysts    GERD (gastroesophageal reflux disease)    Hemorrhoids, external, without mention of complication    Hormone replacement therapy (postmenopausal)    IBS (irritable bowel syndrome)    Migraine    Mitral valve disease    Pacemaker    Complete heart block - Medtronic   Right knee DJD    Seizures (HCC)    Sleep apnea    TIA (transient ischemic attack)    Type 2 diabetes mellitus (HCC)       ROS As per HPI.    Objective:  BP 110/64   Pulse 68   Temp 98.1 F (36.7 C) (Temporal)   Ht 5\' 4"  (1.626 m)   Wt 146 lb 6 oz (66.4 kg)   SpO2 99%   BMI 25.13 kg/m  Wt Readings from Last 3 Encounters:  10/28/22 146 lb 6 oz (66.4 kg)  10/23/22 151 lb 6 oz (68.7 kg)  10/17/22 150 lb 12.7 oz (68.4 kg)      Physical Exam Vitals and nursing note reviewed.  Constitutional:      General: She is not in acute distress.    Appearance: Normal appearance. She is not ill-appearing, toxic-appearing or diaphoretic.  HENT:     Head: Normocephalic and atraumatic.     Right Ear: Tympanic membrane, ear canal and external ear normal.     Left Ear: Tympanic membrane, ear canal and external ear normal.     Mouth/Throat:     Mouth: Mucous membranes are moist.     Pharynx: Oropharynx is clear. No oropharyngeal exudate or posterior oropharyngeal erythema.  Eyes:     Extraocular Movements: Extraocular movements intact.     Pupils: Pupils are  equal, round, and reactive to light.  Cardiovascular:     Rate and Rhythm: Normal rate and regular rhythm.     Heart sounds: Murmur heard.     Systolic murmur is present with a grade of 2/6.  Pulmonary:     Effort: Pulmonary effort is normal. No respiratory distress.     Breath sounds: Normal breath sounds.  Abdominal:     General: Bowel sounds are normal. There is no distension.     Palpations: Abdomen is soft.     Tenderness: There is no abdominal tenderness. There is no guarding or rebound.  Musculoskeletal:     Cervical back: Neck supple. No rigidity.     Right lower leg: No edema.     Left lower leg: No edema.  Skin:    General: Skin is warm and dry.  Neurological:     Mental Status: She is alert and oriented to person, place, and time. Mental status is at baseline.     Gait: Gait abnormal (using cane).  Psychiatric:        Attention and Perception: Attention normal.        Mood and Affect: Mood normal.        Speech: Speech normal.        Cognition and Memory: Cognition is impaired. Memory is impaired.      No results found for any visits on 10/28/22.    The 10-year ASCVD risk score (Arnett DK, et al., 2019) is: 18.3%    Assessment & Plan:   Carol Richmond was seen today for medical management of chronic issues and diabetes.  Diagnoses and all orders for this visit:  Type 2 diabetes mellitus with hyperglycemia, without long-term current use of insulin (HCC) A1c 5.9 today, at goal of <7. Medication changes today: none, continue metformin. She is on an ACE/ARB and has declined a statin. Eye exam: reminded to schedule. Foot exam: UTD. Urine micro: UTD. Diet and exercise.  -     BMP8+EGFR -     Bayer DCA Hb A1c Waived  Hypertension associated with type 2 diabetes mellitus (HCC) Well controlled on current regimen.   Hyperlipidemia associated with type 2 diabetes mellitus (HCC) Last LDL 102. She isn't fasting today. Will plan for repeat fasting at next visit. She has  declined medications.   Chronic HFrEF (heart failure with reduced ejection  fraction) (HCC) Euvolemic today. Follow up with cardiology as scheduled.   Atrial fibrillation, unspecified type (HCC) Regular rate and rhythm today. On coumadin. Last INR at goal. Follow up with cardiology as scheduled.   Depression, recurrent (HCC) Anxiety Benzodiazepine dependence (HCC) Controlled substance agreement signed Well controlled on current regimen. CSA and UDS are UTD. Continue celexa daily, ativan prn.   Stable central retinal vein occlusion of right eye Reminded to schedule eye exam.   Partial seizure (HCC) Well controlled on current regimen. On lamictal.   Need for vaccination Tdap IM today in office.   Return in about 3 months (around 01/27/2023) for chronic follow up.   The patient indicates understanding of these issues and agrees with the plan.  Gabriel Earing, FNP

## 2022-10-29 LAB — BMP8+EGFR
BUN/Creatinine Ratio: 7 — ABNORMAL LOW (ref 12–28)
BUN: 8 mg/dL (ref 8–27)
CO2: 18 mmol/L — ABNORMAL LOW (ref 20–29)
Calcium: 8.8 mg/dL (ref 8.7–10.3)
Chloride: 103 mmol/L (ref 96–106)
Creatinine, Ser: 1.07 mg/dL — ABNORMAL HIGH (ref 0.57–1.00)
Glucose: 100 mg/dL — ABNORMAL HIGH (ref 70–99)
Potassium: 4.5 mmol/L (ref 3.5–5.2)
Sodium: 140 mmol/L (ref 134–144)
eGFR: 56 mL/min/{1.73_m2} — ABNORMAL LOW (ref >59)

## 2022-10-31 ENCOUNTER — Telehealth: Payer: Self-pay | Admitting: Cardiology

## 2022-10-31 ENCOUNTER — Ambulatory Visit: Payer: Medicare HMO | Admitting: Cardiology

## 2022-10-31 ENCOUNTER — Telehealth: Payer: Self-pay | Admitting: Family Medicine

## 2022-10-31 MED ORDER — AMIODARONE HCL 200 MG PO TABS: 200.0000 mg | ORAL_TABLET | Freq: Every day | ORAL | 0 refills | Status: DC

## 2022-10-31 NOTE — Telephone Encounter (Signed)
Pt coming in tomorrow at 1:15, will address at that appt.

## 2022-10-31 NOTE — Telephone Encounter (Signed)
*  STAT* If patient is at the pharmacy, call can be transferred to refill team.   1. Which medications need to be refilled? (please list name of each medication and dose if known) amiodarone (PACERONE) 200 MG tablet   2. Would you like to learn more about the convenience, safety, & potential cost savings by using the [facility]? No   3. Are you open to using the [facility] No   4. Which pharmacy/location (including street and city if local pharmacy) is medication to be sent to?[facility] - [place], [place] - [address]    5. Do they need a 30 day or 90 day supply? 90 Day Supply

## 2022-11-01 ENCOUNTER — Encounter: Payer: Self-pay | Admitting: Family Medicine

## 2022-11-01 ENCOUNTER — Other Ambulatory Visit: Payer: Self-pay

## 2022-11-01 ENCOUNTER — Ambulatory Visit (INDEPENDENT_AMBULATORY_CARE_PROVIDER_SITE_OTHER): Payer: Medicare HMO | Admitting: Family Medicine

## 2022-11-01 VITALS — BP 98/60 | HR 74 | Temp 97.9°F | Ht 64.0 in | Wt 140.4 lb

## 2022-11-01 DIAGNOSIS — R4189 Other symptoms and signs involving cognitive functions and awareness: Secondary | ICD-10-CM

## 2022-11-01 DIAGNOSIS — E1165 Type 2 diabetes mellitus with hyperglycemia: Secondary | ICD-10-CM | POA: Diagnosis not present

## 2022-11-01 DIAGNOSIS — R42 Dizziness and giddiness: Secondary | ICD-10-CM

## 2022-11-01 DIAGNOSIS — R569 Unspecified convulsions: Secondary | ICD-10-CM | POA: Diagnosis not present

## 2022-11-01 DIAGNOSIS — N3000 Acute cystitis without hematuria: Secondary | ICD-10-CM

## 2022-11-01 DIAGNOSIS — I6523 Occlusion and stenosis of bilateral carotid arteries: Secondary | ICD-10-CM

## 2022-11-01 LAB — MICROSCOPIC EXAMINATION
Bacteria, UA: NONE SEEN
RBC, Urine: NONE SEEN /HPF (ref 0–2)
Renal Epithel, UA: NONE SEEN /HPF
Yeast, UA: NONE SEEN

## 2022-11-01 LAB — URINALYSIS, ROUTINE W REFLEX MICROSCOPIC
Bilirubin, UA: NEGATIVE
Glucose, UA: NEGATIVE
Ketones, UA: NEGATIVE
Nitrite, UA: NEGATIVE
Protein,UA: NEGATIVE
RBC, UA: NEGATIVE
Specific Gravity, UA: 1.01 (ref 1.005–1.030)
Urobilinogen, Ur: 0.2 mg/dL (ref 0.2–1.0)
pH, UA: 6 (ref 5.0–7.5)

## 2022-11-01 NOTE — Progress Notes (Signed)
Established Patient Office Visit  Subjective   [name], female    DOB: Apr 20, 1950  Age: 72 y.o. MRN: [id number]  Chief Complaint  Patient presents with   Memory Loss    HPI Carol Richmond is here for lightheadedness for a few days. This has been coming and going. Reports that it feels like her head is light. Denies chest pain, shortness of breath, edema, palpitations, focal weakness, changes in vision, numbness, tingling, changes in gait, syncope, or presyncope. She isn't sure what aggravates or triggers the lightheadedness. Denies occurrence with changes in position. She continues to have nasal congestion. She just restarted on her allergy medication yesterday. She has been having an intermittent mild frontal headache. She took tylenol this morning with resolution of HA and lightheadedness.   She has been more forgetful lately and has been noted to repeat herself over the last few weeks. She was acutely confused on admission to the hospital recently. She had a CT without acute finding. She was noted to have a UTI. She has completed treatment for this and denies urinary symptoms. She denies needing assistance with ADLs, confusion, difficulty with driving, forgetting peoples names. She has a history of partial seizures. She reports compliance with lamictal and denies any seizure like activity for many years. She is not established with neurology and isn't sure if she has seen a neurologist before.      11/01/2022    1:05 PM  MMSE - Mini Mental State Exam  Orientation to time 3  Orientation to Place 2  Registration 2  Attention/ Calculation 0  Recall 1  Language- name 2 objects 2  Language- repeat 1  Language- follow 3 step command 0  Language- read & follow direction 0  Write a sentence 0  Copy design 0  Total score 11       ROS As per HPI.    Objective:     BP 98/60   Pulse 74   Temp 97.9 F (36.6 C) (Temporal)   Ht 5\' 4"  (1.626 m)   Wt 140 lb 6 oz  (63.7 kg)   SpO2 100%   BMI 24.10 kg/m    Physical Exam Vitals and nursing note reviewed.  Constitutional:      General: She is not in acute distress.    Appearance: Normal appearance. She is not ill-appearing, toxic-appearing or diaphoretic.  HENT:     Head: Normocephalic and atraumatic.     Right Ear: Tympanic membrane, ear canal and external ear normal.     Left Ear: Tympanic membrane, ear canal and external ear normal.     Mouth/Throat:     Mouth: Mucous membranes are moist.     Pharynx: Oropharynx is clear. No oropharyngeal exudate or posterior oropharyngeal erythema.  Eyes:     Extraocular Movements: Extraocular movements intact.     Pupils: Pupils are equal, round, and reactive to light.  Cardiovascular:     Rate and Rhythm: Normal rate and regular rhythm.     Heart sounds: Murmur heard.     Systolic murmur is present with a grade of 2/6.  Pulmonary:     Effort: Pulmonary effort is normal. No respiratory distress.     Breath sounds: Normal breath sounds.  Abdominal:     General: Bowel sounds are normal. There is no distension.     Palpations: Abdomen is soft.     Tenderness: There is no abdominal tenderness. There is no guarding or rebound.  Musculoskeletal:  Cervical back: Neck supple. No rigidity.     Right lower leg: No edema.     Left lower leg: No edema.  Skin:    General: Skin is warm and dry.  Neurological:     Mental Status: She is alert and oriented to person, place, and time.     Cranial Nerves: No cranial nerve deficit.     Sensory: No sensory deficit.     Motor: No weakness.     Coordination: Coordination normal.     Gait: Gait normal.  Psychiatric:        Attention and Perception: Attention normal.        Mood and Affect: Mood normal.        Speech: Speech normal.        Behavior: Behavior normal.        Cognition and Memory: Cognition is impaired. Memory is impaired.      No results found for any visits on 11/01/22.    The 10-year  ASCVD risk score (Arnett DK, et al., 2019) is: 14.8%    Assessment & Plan:   Zelene was seen today for memory loss.  Diagnoses and all orders for this visit:  Moderate cognitive impairment -     Ammonia -     CMP14+EGFR -     CBC with Differential/Platelet -     Phosphorus -     Ambulatory referral to Neurology  Partial seizure (HCC) -     Lamotrigine level -     Ambulatory referral to Neurology  Acute cystitis without hematuria -     Urinalysis, Routine w reflex microscopic  Episodic lightheadedness -     CMP14+EGFR -     CBC with Differential/Platelet  Type 2 diabetes mellitus with hyperglycemia, without long-term current use of insulin (HCC) -     Microalbumin / creatinine urine ratio   MMSE consistent with moderate cognitive impairment. Discussed this with patient. Will check labs as above today. Recent head CT was negative for acute findings. Discussed and placed referral to neurology for further evaluation of both cognitive impairment and history of partial seizures. Will check UA today as she recently had UTI with acute encephalology without urinary symptoms. No acute findings on exam today. Negative orthostatics today for lightheadedness. It is difficulty to determine etiology of this today as it is difficulty to get a detailed history however she reports that this resolved after taking tylenol earlier this morning. Denies any cardiac or neurological symptoms. Will check urine micro today for T2DM.   Return to office for new or worsening symptoms, or if symptoms persist.   The patient indicates understanding of these issues and agrees with the plan.  Gabriel Earing, FNP

## 2022-11-02 LAB — MICROALBUMIN / CREATININE URINE RATIO
Creatinine, Urine: 13.5 mg/dL
Microalb/Creat Ratio: <22 mg/g{creat} (ref 0–29)
Microalbumin, Urine: <3 ug/mL

## 2022-11-04 NOTE — Telephone Encounter (Signed)
Patient had appointment 9/13- this encounter will be closed

## 2022-11-05 ENCOUNTER — Other Ambulatory Visit (INDEPENDENT_AMBULATORY_CARE_PROVIDER_SITE_OTHER): Payer: Self-pay | Admitting: Gastroenterology

## 2022-11-05 DIAGNOSIS — K7682 Hepatic encephalopathy: Secondary | ICD-10-CM

## 2022-11-05 LAB — CBC WITH DIFFERENTIAL/PLATELET
Basophils Absolute: 0.1 10*3/uL (ref 0.0–0.2)
Basos: 1 %
EOS (ABSOLUTE): 0.1 10*3/uL (ref 0.0–0.4)
Eos: 0 %
Hematocrit: 32.7 % — ABNORMAL LOW (ref 34.0–46.6)
Hemoglobin: 11 g/dL — ABNORMAL LOW (ref 11.1–15.9)
Immature Grans (Abs): 0 10*3/uL (ref 0.0–0.1)
Immature Granulocytes: 0 %
Lymphocytes Absolute: 2.1 10*3/uL (ref 0.7–3.1)
Lymphs: 17 %
MCH: 29.3 pg (ref 26.6–33.0)
MCHC: 33.6 g/dL (ref 31.5–35.7)
MCV: 87 fL (ref 79–97)
Monocytes Absolute: 1.5 10*3/uL — ABNORMAL HIGH (ref 0.1–0.9)
Monocytes: 12 %
Neutrophils Absolute: 8.4 10*3/uL — ABNORMAL HIGH (ref 1.4–7.0)
Neutrophils: 70 %
Platelets: 281 10*3/uL (ref 150–450)
RBC: 3.76 x10E6/uL — ABNORMAL LOW (ref 3.77–5.28)
RDW: 12.8 % (ref 11.7–15.4)
WBC: 12.1 10*3/uL — ABNORMAL HIGH (ref 3.4–10.8)

## 2022-11-05 LAB — CMP14+EGFR
ALT: 19 IU/L (ref 0–32)
AST: 27 IU/L (ref 0–40)
Albumin: 4 g/dL (ref 3.9–4.9)
Alkaline Phosphatase: 173 IU/L — ABNORMAL HIGH (ref 44–121)
BUN/Creatinine Ratio: 10 — ABNORMAL LOW (ref 12–28)
BUN: 12 mg/dL (ref 8–27)
Bilirubin Total: 0.4 mg/dL (ref 0.0–1.2)
CO2: 23 mmol/L (ref 20–29)
Calcium: 9.2 mg/dL (ref 8.7–10.3)
Chloride: 99 mmol/L (ref 96–106)
Creatinine, Ser: 1.16 mg/dL — ABNORMAL HIGH (ref 0.57–1.00)
Globulin, Total: 3.4 g/dL (ref 1.5–4.5)
Glucose: 99 mg/dL (ref 70–99)
Potassium: 4.1 mmol/L (ref 3.5–5.2)
Sodium: 139 mmol/L (ref 134–144)
Total Protein: 7.4 g/dL (ref 6.0–8.5)
eGFR: 51 mL/min/{1.73_m2} — ABNORMAL LOW (ref >59)

## 2022-11-05 LAB — AMMONIA: Ammonia: 107 ug/dL (ref 34–178)

## 2022-11-05 LAB — LAMOTRIGINE LEVEL: Lamotrigine Lvl: 11.1 ug/mL (ref 2.0–20.0)

## 2022-11-05 LAB — PHOSPHORUS: Phosphorus: 3.5 mg/dL (ref 3.0–4.3)

## 2022-11-06 ENCOUNTER — Telehealth: Payer: Self-pay | Admitting: Family Medicine

## 2022-11-06 NOTE — Telephone Encounter (Signed)
Called pt and advised labs stable per PCP note on results and pt voiced understanding.

## 2022-11-07 ENCOUNTER — Ambulatory Visit: Payer: Medicare HMO | Admitting: Cardiology

## 2022-11-11 ENCOUNTER — Other Ambulatory Visit: Payer: Self-pay | Admitting: Family Medicine

## 2022-11-12 ENCOUNTER — Other Ambulatory Visit: Payer: Self-pay

## 2022-11-12 MED ORDER — WARFARIN SODIUM 5 MG PO TABS: ORAL_TABLET | ORAL | 5 refills | Status: DC

## 2022-11-12 NOTE — Telephone Encounter (Signed)
Refill request for warfarin:  Last INR was 2.5 on 09/30/22 Next INR due 11/18/22 LOV was 10/09/22  Refill approved.

## 2022-11-18 ENCOUNTER — Ambulatory Visit: Payer: Medicare HMO | Attending: Cardiology | Admitting: *Deleted

## 2022-11-18 DIAGNOSIS — Z5181 Encounter for therapeutic drug level monitoring: Secondary | ICD-10-CM | POA: Diagnosis not present

## 2022-11-18 DIAGNOSIS — I4891 Unspecified atrial fibrillation: Secondary | ICD-10-CM

## 2022-11-18 LAB — POCT INR: INR: 3.7 — AB (ref 2.0–3.0)

## 2022-11-18 NOTE — Patient Instructions (Signed)
Hold warfarin tonight, take 1/2 tablet tomorrow night then resume 1 tablet daily except 1/2 tablet Mondays, Wednesdays and Fridays Recheck in 3 wks

## 2022-11-19 ENCOUNTER — Encounter: Payer: Self-pay | Admitting: Family Medicine

## 2022-11-19 ENCOUNTER — Telehealth: Payer: Self-pay | Admitting: Family Medicine

## 2022-11-19 ENCOUNTER — Ambulatory Visit (INDEPENDENT_AMBULATORY_CARE_PROVIDER_SITE_OTHER): Payer: Medicare HMO | Admitting: Family Medicine

## 2022-11-19 VITALS — BP 129/75 | HR 88 | Temp 97.2°F | Ht 64.0 in | Wt 141.4 lb

## 2022-11-19 DIAGNOSIS — K1370 Unspecified lesions of oral mucosa: Secondary | ICD-10-CM | POA: Diagnosis not present

## 2022-11-19 MED ORDER — LIDOCAINE VISCOUS HCL 2 % MT SOLN
10.0000 mL | Freq: Three times a day (TID) | ORAL | 0 refills | Status: DC
Start: 2022-11-19 — End: 2023-01-27

## 2022-11-19 NOTE — Telephone Encounter (Signed)
Lmtcb.

## 2022-11-19 NOTE — Telephone Encounter (Signed)
decline

## 2022-11-19 NOTE — Telephone Encounter (Signed)
Patient would like to switch providers. She wants to see either [facility] or [facility]. Stated that if this is not done then she "will just go somewhere else" because "I know it can be done" per patient.   Assured patient that we are able to request the switch but we have to have approval from both providers and she would get a call back.

## 2022-11-19 NOTE — Progress Notes (Signed)
Subjective:  [name], female    DOB: August 08, 1950, 72 y.o.   MRN: [id number]  Patient Care Team: Gabriel Earing, FNP as PCP - General (Family Medicine) Jonelle Sidle, MD as PCP - Cardiology (Cardiology) Marinus Maw, MD as PCP - Electrophysiology (Cardiology) Van Clines, MD as Consulting Physician (Neurology) Malissa Hippo, MD (Inactive) as Consulting Physician (Gastroenterology) Dr Desiree Lucy [facility]ometrist, [place], OD (Optometry) Chuck Hint, MD as Consulting Physician (Vascular Surgery) Danella Maiers, [facility] as Pharmacist (Family Medicine) Audrie Gallus, RN as [facility] Care Management Sharlene Dory, NP as Nurse Practitioner (Cardiology)   Chief Complaint:  Thrush   HPI: Carol Richmond is a 72 y.o. female presenting on 11/19/2022 for Thrush   Pt presents today with complaints of ongoing tongue burning and lesions. She has been seen by many providers, including specialists, for this. Has been treated for Thrush multiple times without resolution of symptoms. Workup for immunosuppression diseases negative. She is not a diabetic.      Relevant past medical, surgical, family, and social history reviewed and updated as indicated.  Allergies and medications reviewed and updated. Data reviewed: Chart in Epic.   Past Medical History:  Diagnosis Date   Asthma    AVNRT (AV nodal re-entry tachycardia) (HCC)    Carotid artery disease (HCC)    Cirrhosis (HCC)    Diverticulosis of colon (without mention of hemorrhage)    Essential hypertension    Functional ovarian cysts    GERD (gastroesophageal reflux disease)    Hemorrhoids, external, without mention of complication    Hormone replacement therapy (postmenopausal)    IBS (irritable bowel syndrome)    Migraine    Mitral valve disease    Pacemaker    Complete heart block - [employer]   Right knee DJD    Seizures (HCC)    Sleep apnea    TIA (transient  ischemic attack)    Type 2 diabetes mellitus (HCC)     Past Surgical History:  Procedure Laterality Date   BIOPSY  09/23/2018   Procedure: BIOPSY;  Surgeon: Malissa Hippo, MD;  [facility]: [facility];  Service: Endoscopy;;  duodeneum and stomach   CARDIOVERSION N/A 08/14/2022   Procedure: CARDIOVERSION;  Surgeon: [name], MD;  Location: [facility] INVASIVE CV LAB;  Service: Cardiovascular;  Laterality: N/A;   CHOLECYSTECTOMY  1980   ENDARTERECTOMY Right 07/01/2017   Procedure: ENDARTERECTOMY CAROTID RIGHT;  Surgeon: Nada Libman, MD;  Location: [facility] OR;  Service: Vascular;  Laterality: Right;   ESOPHAGOGASTRODUODENOSCOPY N/A 09/23/2018   Procedure: ESOPHAGOGASTRODUODENOSCOPY (EGD);  Surgeon: Malissa Hippo, MD;  Location: [facility];  Service: Endoscopy;  Laterality: N/A;  2:45-moved to 1:45 per Dewayne Hatch   ESOPHAGOGASTRODUODENOSCOPY (EGD) WITH PROPOFOL N/A 03/07/2021   Procedure: ESOPHAGOGASTRODUODENOSCOPY (EGD) WITH PROPOFOL;  Surgeon: Malissa Hippo, MD;  Location: [facility];  Service: Endoscopy;  Laterality: N/A;  12:50   LEFT HEART CATHETERIZATION WITH CORONARY ANGIOGRAM N/A 11/18/2013   Procedure: LEFT HEART CATHETERIZATION WITH CORONARY ANGIOGRAM;  Surgeon: Kathleene Hazel, MD;  Location: [facility] CATH LAB;  Service: Cardiovascular;  Laterality: N/A;   OOPHORECTOMY     2012 Dr. Donzetta Matters in [employer]   PACEMAKER IMPLANT N/A 12/14/2021   Procedure: PACEMAKER IMPLANT;  Surgeon: Marinus Maw, MD;  Location: [facility] INVASIVE CV LAB;  Service: Cardiovascular;  Laterality: N/A;   PATCH ANGIOPLASTY Right 07/01/2017   Procedure: RIGHT CAROTID PATCH ANGIOPLASTY;  Surgeon: Nada Libman, MD;  Location: [facility] OR;  Service: Vascular;  Laterality: Right;   SVT ABLATION N/A 12/14/2021   Procedure: SVT ABLATION;  Surgeon: Marinus Maw, MD;  Location: [facility] INVASIVE CV LAB;  Service: Cardiovascular;  Laterality: N/A;   TEE WITHOUT CARDIOVERSION N/A 03/01/2022   Procedure: TRANSESOPHAGEAL ECHOCARDIOGRAM  (TEE);  Surgeon: Thurmon Fair, MD;  Location: [facility] ENDOSCOPY;  Service: Cardiovascular;  Laterality: N/A;   TEE WITHOUT CARDIOVERSION N/A 08/14/2022   Procedure: TRANSESOPHAGEAL ECHOCARDIOGRAM;  Surgeon: [name], MD;  Location: [facility] INVASIVE CV LAB;  Service: Cardiovascular;  Laterality: N/A;   TONSILLECTOMY     TOTAL KNEE ARTHROPLASTY Right 09/02/2012   Procedure: TOTAL KNEE ARTHROPLASTY- right;  Surgeon: Loreta Ave, MD;  Location: [facility] OR;  Service: Orthopedics;  Laterality: Right;   TUBAL LIGATION     TYMPANOMASTOIDECTOMY Left 04/02/2016   Procedure: LEFT CANAL WALL DOWN TYMPANOMASTOIDECTOMY;  Surgeon: Newman Pies, MD;  Location: [facility];  Service: ENT;  Laterality: Left;   VAGINAL HYSTERECTOMY  1990   WOUND EXPLORATION Right 07/02/2017   Procedure: RIGHT NECK EXPLORATION WITH INTRAOPERATIVE ULTRASOUND;  Surgeon: Chuck Hint, MD;  Location: [facility] OR;  Service: Vascular;  Laterality: Right;    Social History   Socioeconomic History   Marital status: Widowed    Spouse name: Not on file   Number of children: 3   Years of education: Not on file   Highest education level: Not on file  Occupational History   Occupation: retired  Tobacco Use   Smoking status: Never   Smokeless tobacco: Never  Vaping Use   Vaping status: Never Used  Substance and Sexual Activity   Alcohol use: No    Alcohol/week: 0.0 standard drinks of alcohol   Drug use: No   Sexual activity: Not Currently  Other Topics Concern   Not on file  Social History Narrative   Lives alone. Granddaughter lives across the street, her 3 children all live nearby   Social Determinants of Health   Financial Resource Strain: Low Risk  (09/25/2022)   Overall Financial Resource Strain (CARDIA)    Difficulty of Paying Living Expenses: Not hard at all  Food Insecurity: No Food Insecurity (10/25/2022)   Hunger Vital Sign    Worried About Running Out of Food in the Last Year: Never true    Ran Out of Food  in the Last Year: Never true  Transportation Needs: No Transportation Needs (10/25/2022)   PRAPARE - Administrator, Civil Service (Medical): No    Lack of Transportation (Non-Medical): No  Physical Activity: Insufficiently Active (09/25/2022)   Exercise Vital Sign    Days of Exercise per Week: 2 days    Minutes of Exercise per Session: 30 min  Stress: No Stress Concern Present (09/25/2022)   [employer] of Occupational Health - Occupational Stress Questionnaire    Feeling of Stress : Not at all  Social Connections: Socially Integrated (09/25/2022)   Social Connection and Isolation Panel [NHANES]    Frequency of Communication with Friends and Family: More than three times a week    Frequency of Social Gatherings with Friends and Family: More than three times a week    Attends Religious Services: More than 4 times per year    Active Member of [employer] or Organizations: Yes    Attends Engineer, structural: More than 4 times per year    Marital Status: Married  Catering manager Violence: Not At Risk (  10/17/2022)   Humiliation, Afraid, Rape, and Kick questionnaire    Fear of Current or Ex-Partner: No    Emotionally Abused: No    Physically Abused: No    Sexually Abused: No    Outpatient Encounter Medications as of 11/19/2022  Medication Sig   Accu-Chek Softclix Lancets lancets check blood sugars twice daily DX E11.65   acetaminophen (TYLENOL) 500 MG tablet Take 500 mg by mouth daily as needed for mild pain.   amiodarone (PACERONE) 200 MG tablet Take 1 tablet (200 mg total) by mouth daily.   Azelastine HCl 0.15 % SOLN    Blood Pressure Monitoring (OMRON 3 SERIES BP MONITOR) DEVI Use as directed   citalopram (CELEXA) 10 MG tablet Take 1 tablet (10 mg total) by mouth daily.   fluticasone (FLONASE) 50 MCG/ACT nasal spray Place 2 sprays into both nostrils daily. (Patient taking differently: Place 2 sprays into both nostrils daily as needed for allergies.)   furosemide (LASIX)  40 MG tablet Take 40 mg by mouth daily.   gabapentin (NEURONTIN) 300 MG capsule Take 300 mg by mouth 3 (three) times daily as needed (neuropathy).   glucose blood (ACCU-CHEK GUIDE) test strip USE AS INSTRUCTED TO TEST BLOOD SUGAR DAILY AS DIRECTED.   lactulose (CHRONULAC) 10 GM/15ML solution take 30 mls (20 gram total) by mouth 3 times a day.   lamoTRIgine (LAMICTAL) 200 MG tablet TAKE (1) TABLET TWICE DAILY. (Patient taking differently: Take 200 mg by mouth 2 (two) times daily.)   LORazepam (ATIVAN) 0.5 MG tablet Take 1 tablet (0.5 mg total) by mouth 2 (two) times daily as needed for anxiety.   magic mouthwash (lidocaine, diphenhydrAMINE, alum & mag hydroxide) suspension Swish and swallow 5 mLs 3 (three) times daily as needed for mouth pain.   magic mouthwash (lidocaine, diphenhydrAMINE, alum & mag hydroxide) suspension Swish and swallow 10 mLs 3 (three) times daily.   metFORMIN (GLUCOPHAGE) 500 MG tablet Take 1 tablet (500 mg total) by mouth 2 (two) times daily with a meal.   metoprolol tartrate (LOPRESSOR) 25 MG tablet Take 0.5 tablets (12.5 mg total) by mouth 2 (two) times daily.   montelukast (SINGULAIR) 10 MG tablet Take 1 tablet (10 mg total) by mouth at bedtime.   ondansetron (ZOFRAN) 4 MG tablet take 1 tablet by mouth every 8 hours as needed for nausea and vomiting.   pantoprazole (PROTONIX) 40 MG tablet TAKE (1) TABLET TWICE DAILY. (Patient taking differently: Take 40 mg by mouth 2 (two) times daily.)   potassium chloride SA (KLOR-CON M) 20 MEQ tablet take 2 tablets by mouth daily.   sacubitril-valsartan (ENTRESTO) 24-26 MG Take 1 tablet by mouth 2 (two) times daily.   traMADol (ULTRAM) 50 MG tablet Take 50 mg by mouth every 6 (six) hours as needed.   traZODone (DESYREL) 50 MG tablet Take 1 tablet (50 mg total) by mouth at bedtime as needed for sleep.   warfarin (COUMADIN) 5 MG tablet Take 1 tablet daily except 1/2 tablet on Mondays, Wednesdays and Fridays or as directed   No  facility-administered encounter medications on file as of 11/19/2022.    No Known Allergies  Review of Systems  Constitutional:  Negative for activity change, appetite change, chills, diaphoresis, fatigue, fever and unexpected weight change.  HENT:         Oral lesions  Respiratory:  Negative for cough, choking and shortness of breath.   Cardiovascular:  Negative for chest pain, palpitations and leg swelling.  Genitourinary: Negative.   All  other systems reviewed and are negative.       Objective:  BP 129/75   Pulse 88   Temp (!) 97.2 F (36.2 C) (Temporal)   Ht 5\' 4"  (1.626 m)   Wt 141 lb 6.4 oz (64.1 kg)   SpO2 98%   BMI 24.27 kg/m    Wt Readings from Last 3 Encounters:  11/19/22 141 lb 6.4 oz (64.1 kg)  11/01/22 140 lb 6 oz (63.7 kg)  10/28/22 146 lb 6 oz (66.4 kg)    Physical Exam Vitals and nursing note reviewed.  HENT:     Head: Normocephalic and atraumatic.     Nose: Nose normal.     Mouth/Throat:     Mouth: Mucous membranes are moist.     Tongue: Lesions (white lesions to bilateral lateral tongue) present.     Pharynx: Oropharynx is clear.  Eyes:     Conjunctiva/sclera: Conjunctivae normal.     Pupils: Pupils are equal, round, and reactive to light.  Cardiovascular:     Rate and Rhythm: Normal rate and regular rhythm.     Heart sounds: Murmur heard.     Systolic murmur is present with a grade of 2/6.  Skin:    General: Skin is warm and dry.     Capillary Refill: Capillary refill takes less than 2 seconds.  Neurological:     General: No focal deficit present.     Mental Status: She is alert and oriented to person, place, and time.  Psychiatric:        Mood and Affect: Mood normal.        Thought Content: Thought content normal.     Results for orders placed or performed in visit on 11/18/22  POCT INR  Result Value Ref Range   INR 3.7 (A) 2.0 - 3.0   POC INR         Pertinent labs & imaging results that were available during my care of the  patient were reviewed by me and considered in my medical decision making.  Assessment & Plan:  Carol Richmond was seen today for thrush.  Diagnoses and all orders for this visit:  Oral lesion Concerns for oral leukoplakia. Will refer to ENT. Treatment with below to help with discomfort. Report new, worsening, or persistent symptoms.  -     Ambulatory referral to ENT -     magic mouthwash (lidocaine, diphenhydrAMINE, alum & mag hydroxide) suspension; Swish and swallow 10 mLs 3 (three) times daily.     Continue all other maintenance medications.  Follow up plan: Return if symptoms worsen or fail to improve.   Continue healthy lifestyle choices, including diet (rich in fruits, vegetables, and lean proteins, and low in salt and simple carbohydrates) and exercise (at least 30 minutes of moderate physical activity daily).    The above assessment and management plan was discussed with the patient. The patient verbalized understanding of and has agreed to the management plan. Patient is aware to call the clinic if they develop any new symptoms or if symptoms persist or worsen. Patient is aware when to return to the clinic for a follow-up visit. Patient educated on when it is appropriate to go to the emergency department.   Kari Baars, FNP-C [facility] [phone]

## 2022-11-19 NOTE — Telephone Encounter (Signed)
I will decline as pt is on a controlled substance.

## 2022-11-20 ENCOUNTER — Ambulatory Visit: Payer: Medicare HMO | Attending: Nurse Practitioner

## 2022-11-20 DIAGNOSIS — I5020 Unspecified systolic (congestive) heart failure: Secondary | ICD-10-CM

## 2022-11-20 NOTE — Telephone Encounter (Signed)
Pt r/c.

## 2022-11-20 NOTE — Telephone Encounter (Signed)
I spoke to pt and advised both providers declined accepting pt as a new pt due to the controlled substance policy and pt was crying thinking [facility] wasn't going to keep her on as a pt and was very upset. I asked pt why she was wanting to switch and she states "I don't know, I just wanted to see someone". Pt also states she hasn't heard anything from neurology although per referral notes they have called twice and left message to call back. Pt given contact number to contact them to schedule appt.

## 2022-11-21 LAB — ECHOCARDIOGRAM LIMITED
Area-P 1/2: 2.77 cm2
Calc EF: 53 %
S' Lateral: 4.1 cm
Single Plane A2C EF: 59.6 %
Single Plane A4C EF: 49.6 %

## 2022-11-25 ENCOUNTER — Telehealth: Payer: Self-pay | Admitting: Family Medicine

## 2022-11-25 ENCOUNTER — Other Ambulatory Visit: Payer: Self-pay | Admitting: Student

## 2022-11-25 NOTE — Telephone Encounter (Signed)
Pt states she has went to [facility] neurology years ago and would like to go back there instead of [facility]. Toni Amend do I need to put a new referral in or can we just re-route her current one?

## 2022-11-27 ENCOUNTER — Encounter: Payer: Self-pay | Admitting: Physician Assistant

## 2022-11-27 NOTE — Telephone Encounter (Signed)
No new Referral needed. I have redirected the current Referral to [facility] as Patient requested.

## 2022-12-03 ENCOUNTER — Encounter: Payer: Self-pay | Admitting: Family Medicine

## 2022-12-06 ENCOUNTER — Telehealth: Payer: Self-pay | Admitting: Nurse Practitioner

## 2022-12-06 ENCOUNTER — Encounter: Payer: Self-pay | Admitting: Nurse Practitioner

## 2022-12-06 ENCOUNTER — Ambulatory Visit: Payer: Medicare HMO | Attending: Nurse Practitioner | Admitting: Nurse Practitioner

## 2022-12-06 VITALS — BP 118/62 | HR 61 | Ht 64.0 in | Wt 144.6 lb

## 2022-12-06 DIAGNOSIS — I5022 Chronic systolic (congestive) heart failure: Secondary | ICD-10-CM

## 2022-12-06 DIAGNOSIS — I340 Nonrheumatic mitral (valve) insufficiency: Secondary | ICD-10-CM

## 2022-12-06 DIAGNOSIS — I050 Rheumatic mitral stenosis: Secondary | ICD-10-CM

## 2022-12-06 DIAGNOSIS — I059 Rheumatic mitral valve disease, unspecified: Secondary | ICD-10-CM

## 2022-12-06 DIAGNOSIS — Z9181 History of falling: Secondary | ICD-10-CM | POA: Diagnosis not present

## 2022-12-06 DIAGNOSIS — Z8679 Personal history of other diseases of the circulatory system: Secondary | ICD-10-CM | POA: Diagnosis not present

## 2022-12-06 DIAGNOSIS — I4891 Unspecified atrial fibrillation: Secondary | ICD-10-CM | POA: Diagnosis not present

## 2022-12-06 DIAGNOSIS — F439 Reaction to severe stress, unspecified: Secondary | ICD-10-CM

## 2022-12-06 DIAGNOSIS — Z950 Presence of cardiac pacemaker: Secondary | ICD-10-CM

## 2022-12-06 NOTE — Progress Notes (Unsigned)
Cardiology Office Note:  .   Date:  10/09/2022 ID:  Carol Richmond, DOB 1950-07-18, MRN [id number] PCP: Gabriel Earing, FNP  [facility] Providers Cardiologist:  Nona Dell, MD Cardiology APP:  Sharlene Dory, NP  Electrophysiologist:  Lewayne Bunting, MD    History of Present Illness: Carol Richmond is a very pleasant 72 y.o. female with a PMH of rheumatic MV disease, moderate to severe mitral valve stenosis, HFmrEF, AVNRT, s/p RF ablation, OSA, CHB, s/p PPM, HTN, carotid artery disease, T2DM, hx of TIA, NASH cirrhosis, G1 EV, GERD, who presents today for follow-up.   I saw patient on April 25, 2022. Was overall doing well from a cardiac perspective.   Admitted 07/2022 for A-fib/A-flutter with RVR. Was initiated on Amiodarone, transferred to [facility]. Underwent TEE/DCCV on 08/14/2022. TEE revealed EF 25-30%, severely reduced RV systolic function, severe mitral valve regurgitation with severe mitral stenosis - see full report below.   Last seen for follow-up on August 26, 2022. Was doing well at the time from a cardiac perspective. Denied any chest pain, shortness of breath, palpitations, syncope, presyncope, dizziness, orthopnea, PND, swelling or significant weight changes, acute bleeding, or claudication.   Prior to today's office visit, I was contacted by patient's GI MD (Dr. Levon Hedger) regarding that patient will be due for repeat colonoscopy later this year and will need to have an EGD for variceal surveillance in 2025. He has requested preoperative evaluation. He has also requested to see if carvedilol can be switched to metoprolol as he stated "this may prevent the progression of her varices, so we could avoid need to proceed with EGD next year."  Today she presents for follow-up. Only chief concern is dyspnea on exertion noted for the last couple of weeks, says it is not bothersome per her report. Denies any chest pain, palpitations, syncope, presyncope, dizziness,  orthopnea, PND, swelling or significant weight changes, acute bleeding, or claudication.  Tearful during interview at one point during conversation, admits to recent stress.  Larey Seat again a few weeks ago. Fell from church.   Switch to a new female doctor. PCP*** Going to see Neurologist with [name] works, [name] is supportive daughter.  Doesn't want to live alone.  Tearful.   Social worker - Conservation officer, nature.  Doesn't want to do a colonoscopy/don't want to do the EGD.  Concerns with open heart surgery.   Studies Reviewed: [name]    TEE 08/14/2022:  1. Left ventricular ejection fraction, by estimation, is 25 to 30%. The  left ventricle has severely decreased function. The left ventricle  demonstrates global hypokinesis.   2. Right ventricular systolic function is severely reduced. The right  ventricular size is mildly enlarged.   3. Left atrial size was moderately dilated. No left atrial/left atrial  appendage thrombus was detected.   4. The mitral valve is rheumatic. Severe mitral valve regurgitation.  Severe mitral stenosis.   5. The aortic valve is tricuspid. Aortic valve regurgitation is trivial.  Risk Assessment/Calculations:    CHA2DS2-VASc Score = 7  This indicates a 11.2% annual risk of stroke. The patient's score is based upon: CHF History: 1 HTN History: 1 Diabetes History: 1 Stroke History: 2 Vascular Disease History: 0 Age Score: 1 Gender Score: 1   The 10-year ASCVD risk score (Arnett DK, et al., 2019) is: 20.8%   Values used to calculate the score:     Age: 72 years     Sex: Female     Is Non-Hispanic  African American: No     Diabetic: Yes     Tobacco smoker: No     Systolic Blood Pressure: 118 mmHg     Is BP treated: Yes     HDL Cholesterol: 47 mg/dL     Total Cholesterol: 213 mg/dL      Physical Exam:   VS:  BP 118/62   Pulse 61   Ht 5\' 4"  (1.626 m)   Wt 144 lb 9.6 oz (65.6 kg)   SpO2 98%   BMI 24.82 kg/m    Wt Readings from Last 3 Encounters:  12/06/22  144 lb 9.6 oz (65.6 kg)  11/19/22 141 lb 6.4 oz (64.1 kg)  11/01/22 140 lb 6 oz (63.7 kg)    GEN: Well nourished, well developed in no acute distress, appears nervous and tearful during interview today.  NECK: No JVD; No carotid bruits CARDIAC: S1/S2, RRR, Grade 1/6 murmur, no rubs, no gallops RESPIRATORY:  Clear to auscultation without rales, wheezing or rhonchi  ABDOMEN: Soft, non-tender, non-distended EXTREMITIES:  No edema; No deformity, mild tremors noted along bilateral forearms  ASSESSMENT AND PLAN: .    HFmrEF -> HFrEF, DOE Stage C, NYHA class I-II symptoms. Most likely in setting of A-fib/A-flutter with RVR. TEE/DCCV revealed EF 25-30% from 40-45%. Euvolemic and well compensated on exam, does admit to some DOE. Continue Entresto and Lopressor - cannot switch to Coreg at this time d/t current BP. Will update limited Echo in 6 weeks to evaluate EF.  At next visit, will consider starting SGLT2i if EF doesn't show improvement. Low sodium diet, fluid restriction <2L, and daily weights encouraged. Educated to contact our office for weight gain of 2 lbs overnight or 5 lbs in one week.   A-fib/A-flutter, s/p DCCV, AVNRT, s/p ablation Underwent previous successful DCCV. Denies any recent palpitations or tachycardia. Continue Lopressor and Amiodarone. Recent LFT and Thyroid panel results were WNL. Continue coumadin and continue to f/u with Coumadin Clinic. Heart healthy diet and regular cardiovascular exercise encouraged.   Rheumatic mitral valve disease, severe mitral stenosis with severe regurgitation Past TEE revealed mitral valve was not amenable to valvuloplasty due to severity of mitral insufficiency and due to severe/asymmetrical focal calcification of mid anterior leaflet.  Severe MR and mitral valve stenosis noted on TEE 07/2022, with mean gradient at 9.5 mmHg. Dr. Diona Browner previously recommended no further surgical management or invasive management of mitral valve at this time based on  TEE results from 02/2022.  Continue current medication regimen.  ED precautions discussed. Discussed results with her during office visit today and discussed treatment options/plan of care. Pt is agreeable to Structural Heart Clinic Evaluation - will place referral.   CHB, s/p PPM Doing well. Normal device function noted during last device check.  Continue follow-up with Dr. Ladona Ridgel.  5. Stress, tremors Admits to recent stressor. Denies any red flag signs/symptoms. Support given. Continue to follow with PCP, previously placed referral for new primary care provider.   6. Pre-operative cardiovascular risk assessment Ms. Bakos's perioperative risk of a major cardiac event is  % according to the Revised Cardiac Risk Index (RCRI).  Therefore, she is at low risk for perioperative complications.   Her functional capacity is good at > 4 METs according to the Duke Activity Status Index (DASI). Recommendations: The patient requires an echocardiogram before a disposition can be made regarding surgical risk.               Would also recommend running this case past structural  heart team for further evaluation prior to proceeding with EGD/colonoscopy. Antiplatelet and/or Anticoagulation Recommendations: Will route note to pharm D once she has had further evaluation from structural heart team and have them comment on Coumadin prior to procedure.   Dispo: Follow-up with me or APP in 8 weeks or sooner if anything changes.   Signed, Sharlene Dory, NP

## 2022-12-06 NOTE — Telephone Encounter (Signed)
Patient verbalized understanding  

## 2022-12-06 NOTE — Patient Instructions (Addendum)
Medication Instructions:  Your physician recommends that you continue on your current medications as directed. Please refer to the Current Medication list given to you today.  Labwork: None   Testing/Procedures: None   Follow-Up: Your physician recommends that you schedule a follow-up appointment in: 3 Month   Any Other Special Instructions Will Be Listed Below (If Applicable).  If you need a refill on your cardiac medications before your next appointment, please call your pharmacy.

## 2022-12-06 NOTE — Telephone Encounter (Signed)
Patient wants a call back directly from NP Lanora Manis and patient stated don't call [name] today and wants a call back after hours.

## 2022-12-06 NOTE — Telephone Encounter (Signed)
Patient stated she did not want NP Lanora Manis to talk to her daughter Selena Batten and wants call back to confirm.

## 2022-12-06 NOTE — Telephone Encounter (Signed)
Patient is following up requesting to speak directly with [facility] peck, NP/nurse only. She states she would like her to hold off on calling her daughter and she would like a call back before 3:30 PM if possible.

## 2022-12-09 ENCOUNTER — Telehealth: Payer: Self-pay | Admitting: Family Medicine

## 2022-12-09 ENCOUNTER — Ambulatory Visit: Payer: Medicare HMO | Attending: Cardiology | Admitting: *Deleted

## 2022-12-09 DIAGNOSIS — Z5181 Encounter for therapeutic drug level monitoring: Secondary | ICD-10-CM | POA: Diagnosis not present

## 2022-12-09 DIAGNOSIS — I4891 Unspecified atrial fibrillation: Secondary | ICD-10-CM

## 2022-12-09 LAB — POCT INR: INR: 2 (ref 2.0–3.0)

## 2022-12-09 MED ORDER — METOPROLOL TARTRATE 25 MG PO TABS: 12.5000 mg | ORAL_TABLET | Freq: Two times a day (BID) | ORAL | 3 refills | Status: DC

## 2022-12-09 NOTE — Telephone Encounter (Signed)
Patient wants PCP nurse to call and explain why she needs to go to urologist.

## 2022-12-09 NOTE — Telephone Encounter (Signed)
I spoke to pt and advised she needs to go to Neurology not Urology and it is due to her memory and pt voiced understanding.

## 2022-12-09 NOTE — Patient Instructions (Signed)
Continue warfarin 1 tablet daily except 1/2 tablet Mondays, Wednesdays and Fridays Recheck in 4 wks

## 2022-12-12 ENCOUNTER — Telehealth: Payer: Self-pay | Admitting: Cardiology

## 2022-12-12 NOTE — Telephone Encounter (Signed)
Pt c/o medication issue:  1. Name of Medication:  warfarin (COUMADIN) 5 MG tablet   2. How are you currently taking this medication (dosage and times per day)?   3. Are you having a reaction (difficulty breathing--STAT)?   4. What is your medication issue?   Patient states she accidentally took her medication during the day instead of at night. She would like to discuss.

## 2022-12-12 NOTE — Telephone Encounter (Signed)
Spoke with pt.  States she took her warfarin this morning instead of tonight.  Told her not to take warfarin again tonight and resume regular night schedule tomorrow.  She verbalized understanding.

## 2022-12-13 ENCOUNTER — Telehealth (HOSPITAL_COMMUNITY): Payer: Self-pay | Admitting: Licensed Clinical Social Worker

## 2022-12-13 NOTE — Telephone Encounter (Signed)
CSW contacted patient to follow up on request from provider to call for supportive intervention. CSW spoke with patient who shared she was out of the home and would like a return call on Monday morning. CSW will return call at that time. Lasandra Beech, LCSW, CCSW-MCS [phone]

## 2022-12-16 ENCOUNTER — Ambulatory Visit (INDEPENDENT_AMBULATORY_CARE_PROVIDER_SITE_OTHER): Payer: Medicare HMO

## 2022-12-16 ENCOUNTER — Telehealth (HOSPITAL_COMMUNITY): Payer: Self-pay | Admitting: Licensed Clinical Social Worker

## 2022-12-16 DIAGNOSIS — I442 Atrioventricular block, complete: Secondary | ICD-10-CM | POA: Diagnosis not present

## 2022-12-16 LAB — CUP PACEART REMOTE DEVICE CHECK
Battery Remaining Longevity: 168 mo
Battery Voltage: 3.11 V
Brady Statistic AP VP Percent: 0 %
Brady Statistic AP VS Percent: 20.14 %
Brady Statistic AS VP Percent: 0.01 %
Brady Statistic AS VS Percent: 79.85 %
Brady Statistic RA Percent Paced: 20.15 %
Brady Statistic RV Percent Paced: 0.01 %
Date Time Interrogation Session: 20241027192523
Implantable Lead Connection Status: 753985
Implantable Lead Connection Status: 753985
Implantable Lead Implant Date: 20231027
Implantable Lead Implant Date: 20231027
Implantable Lead Location: 753859
Implantable Lead Location: 753860
Implantable Lead Model: 3830
Implantable Lead Model: 5076
Implantable Pulse Generator Implant Date: 20231027
Lead Channel Impedance Value: 285 Ohm
Lead Channel Impedance Value: 361 Ohm
Lead Channel Impedance Value: 437 Ohm
Lead Channel Impedance Value: 570 Ohm
Lead Channel Pacing Threshold Amplitude: 0.625 V
Lead Channel Pacing Threshold Amplitude: 0.625 V
Lead Channel Pacing Threshold Pulse Width: 0.4 ms
Lead Channel Pacing Threshold Pulse Width: 0.4 ms
Lead Channel Sensing Intrinsic Amplitude: 14 mV
Lead Channel Sensing Intrinsic Amplitude: 14 mV
Lead Channel Sensing Intrinsic Amplitude: 2.625 mV
Lead Channel Sensing Intrinsic Amplitude: 2.625 mV
Lead Channel Setting Pacing Amplitude: 2 V
Lead Channel Setting Pacing Amplitude: 2.5 V
Lead Channel Setting Pacing Pulse Width: 0.4 ms
Lead Channel Setting Sensing Sensitivity: 1.2 mV
Zone Setting Status: 755011

## 2022-12-16 NOTE — Telephone Encounter (Signed)
CSW contacted patient this morning per her request on Friday. CSW mentioned that she was referred for supportive intervention and patient responded "I am doing ok now". Patient denies any needs at this time and states she will hold on to CSW contact information and return call if needed. Lasandra Beech, LCSW, CCSW-MCS [phone]

## 2022-12-18 ENCOUNTER — Telehealth: Payer: Self-pay

## 2022-12-18 ENCOUNTER — Telehealth: Payer: Self-pay | Admitting: Cardiology

## 2022-12-18 DIAGNOSIS — Z758 Other problems related to medical facilities and other health care: Secondary | ICD-10-CM

## 2022-12-18 NOTE — Telephone Encounter (Signed)
Patient is returning call. Please advise? 

## 2022-12-18 NOTE — Telephone Encounter (Signed)
Pt would like to speak with a nurse about getting a new PCP doctor and needs some assistance. Please advise

## 2022-12-18 NOTE — Telephone Encounter (Signed)
Called patient stated that she will call us back

## 2022-12-18 NOTE — Telephone Encounter (Signed)
Patient previously seen Carol Richmond and she is interested in having [place] send in a referral to [place] PCP she id wanting to leave [place]

## 2022-12-18 NOTE — Telephone Encounter (Signed)
Pt wants to establish care. Please review patient chart and advise.

## 2022-12-19 ENCOUNTER — Ambulatory Visit: Payer: Medicare HMO | Admitting: *Deleted

## 2022-12-19 ENCOUNTER — Telehealth: Payer: Self-pay | Admitting: Family Medicine

## 2022-12-19 DIAGNOSIS — E1165 Type 2 diabetes mellitus with hyperglycemia: Secondary | ICD-10-CM

## 2022-12-19 NOTE — Telephone Encounter (Signed)
I spoke to pt and she is having trouble with her lancets and not the Glucometer. Scheduled pt on the nurse schedule this afternoon and advised her to bring it into the office and I will try to help her.

## 2022-12-19 NOTE — Progress Notes (Signed)
Pt came in today to get help with her accuchek lancing device. The lancing device is not working properly and I tried multiple times to troubleshoot it and can't fix it. Offered to send in an rx for a new one but pt declines. Pt states she will just buy one.

## 2022-12-20 ENCOUNTER — Encounter: Payer: Self-pay | Admitting: *Deleted

## 2022-12-20 ENCOUNTER — Other Ambulatory Visit: Payer: Self-pay | Admitting: *Deleted

## 2022-12-20 NOTE — Telephone Encounter (Signed)
Appt scheduled pt aware  

## 2022-12-20 NOTE — Patient Outreach (Signed)
Care Management   Visit Note  12/20/2022 Name: Carol Richmond MRN: [id number] DOB: 1950/12/09  Subjective: Carol Richmond is a 72 y.o. year old female who is a primary care patient of Gabriel Earing, FNP. The Care Management team was consulted for assistance.      Engaged with patient spoke with patient by telephone for follow up   Goals Addressed             This Visit's Progress    COMPLETED: CCM (DIABETES) EXPECTED OUTCOME:  MONITOR, SELF-MANAGE AND REDUCE SYMPTOMS OF DIABETES       Current Barriers:  Knowledge Deficits related to Diabetes management Chronic Disease Management support and education needs related to Diabetes and diet Patient reports she checks CBG every other day with fasting readings ranges continuing in low 100's. Patient is exercising, does not always follow a special diet, AIC 5.9 on 10/28/22 Patient reports no new concerns today  Planned Interventions: Reviewed medications with patient and discussed importance of medication adherence;        Counseled on importance of regular laboratory monitoring as prescribed;        Discussed plans with patient for ongoing care management follow up and provided patient with direct contact information for care management team;      Advised patient, providing education and rationale, to check cbg once daily and record        call provider for findings outside established parameters;       Review of patient status, including review of consultants reports, relevant laboratory and other test results, and medications completed;       Reviewed carbohydrate modified diet and nutritious food choices Reviewed upcoming scheduled appointments Reviewed plan of care with pt including case closure  Symptom Management: Take medications as prescribed   Attend all scheduled provider appointments Call pharmacy for medication refills 3-7 days in advance of running out of medications Attend church or other social  activities Perform all self care activities independently  Perform IADL's (shopping, preparing meals, housekeeping, managing finances) independently Call provider office for new concerns or questions  check blood sugar at prescribed times: once daily check feet daily for cuts, sores or redness enter blood sugar readings and medication or insulin into daily log take the blood sugar log to all doctor visits take the blood sugar meter to all doctor visits trim toenails straight across drink 6 to 8 glasses of water each day eat fish at least once per week fill half of plate with vegetables limit fast food meals to no more than 1 per week manage portion size prepare main meal at home 3 to 5 days each week read food labels for fat, fiber, carbohydrates and portion size set a realistic goal Be mindful of carbohydrate intake at each meal (too much bread, pasta, rice, potatoes at one meal with elevate your blood sugar) Case closure        COMPLETED: CCM (HYPERTENSION) EXPECTED OUTCOME: MONITOR, SELF-MANAGE AND REDUCE SYMPTOMS OF HYPERTENSION       Current Barriers:  Knowledge Deficits related to Hypertension management Chronic Disease Management support and education needs related to Hypertension, diet Patient reports she is widowed and lives alone, is independent with all aspects of her care, continues to drive, goes to the gym approximately 5 days per week, has adult daughter she can call on if needed, does not monitor blood pressure, is considering obtaining blood pressure cuff from [facility] and may start checking BP.  Pt reports she does  not always follow a special diet. Patient reports she has all medication and taking as prescribed, no new concerns reported Patient reports she is managing A-Fib well and following up with cardiologist No new concerns reported  Planned Interventions: Evaluation of current treatment plan related to hypertension self management and patient's adherence to plan  as established by provider;   Reviewed medications with patient and discussed importance of compliance;  Counseled on the importance of exercise goals with target of 150 minutes per week Discussed plans with patient for ongoing care management follow up and provided patient with direct contact information for care management team; Advised patient, providing education and rationale, to monitor blood pressure daily and record, calling PCP for findings outside established parameters;  Advised patient to discuss any issues with blood pressure, medications with provider; Discussed complications of poorly controlled blood pressure such as heart disease, stroke, circulatory complications, vision complications, kidney impairment, sexual dysfunction;  Reviewed low sodium diet and importance of reading food labels Reinforced A-Fib action plan  Symptom Management: Take medications as prescribed   Attend all scheduled provider appointments Call pharmacy for medication refills 3-7 days in advance of running out of medications Attend church or other social activities Perform all self care activities independently  Perform IADL's (shopping, preparing meals, housekeeping, managing finances) independently Call provider office for new concerns or questions  check blood pressure weekly choose a place to take my blood pressure (home, clinic or office, retail store) write blood pressure results in a log or diary take blood pressure log to all doctor appointments keep all doctor appointments take medications for blood pressure exactly as prescribed begin an exercise program report new symptoms to your doctor eat more whole grains, fruits and vegetables, lean meats and healthy fats Continue working out at the gym- keep up the good work! Follow low sodium diet- read food labels for sodium content Limit fast food Follow A-fib action plan, call your doctor early on for change in health status/ symptoms            Plan: No further follow up required: case closure  Irving Shows [facility], BSN [place]/ Ambulatory Care Management [phone]

## 2022-12-23 ENCOUNTER — Other Ambulatory Visit (INDEPENDENT_AMBULATORY_CARE_PROVIDER_SITE_OTHER): Payer: Self-pay | Admitting: Gastroenterology

## 2022-12-24 ENCOUNTER — Ambulatory Visit: Payer: Medicare HMO | Admitting: Nurse Practitioner

## 2022-12-26 ENCOUNTER — Ambulatory Visit (INDEPENDENT_AMBULATORY_CARE_PROVIDER_SITE_OTHER): Payer: Medicare HMO | Admitting: Family Medicine

## 2022-12-26 ENCOUNTER — Encounter: Payer: Self-pay | Admitting: Family Medicine

## 2022-12-26 VITALS — BP 98/51 | HR 60 | Temp 98.1°F | Ht 64.0 in | Wt 145.0 lb

## 2022-12-26 DIAGNOSIS — J029 Acute pharyngitis, unspecified: Secondary | ICD-10-CM | POA: Diagnosis not present

## 2022-12-26 DIAGNOSIS — B370 Candidal stomatitis: Secondary | ICD-10-CM

## 2022-12-26 DIAGNOSIS — Z23 Encounter for immunization: Secondary | ICD-10-CM

## 2022-12-26 MED ORDER — CLOTRIMAZOLE 10 MG MT TROC: 10.0000 mg | Freq: Every day | OROMUCOSAL | 0 refills | Status: DC

## 2022-12-26 NOTE — Progress Notes (Signed)
Acute Office Visit  Subjective:     [name], female    DOB: 07-21-1950, 72 y.o.   MRN: [id number]  Chief Complaint  Patient presents with   Sore Throat    HPI Carol Richmond is here for a sore throat. Poor historian and difficult to obtain accurate history due to conflicting information provided by patient. ? Left sided sore throat vs anterior left sided neck pain for a few weeks. No cough, congestion, fever, chills, trouble swallowing, hoarseness, shortness of breath. She does reports left ear pain intermittently. No changes in hearing. Reports pain is worse in the morning when waking. Denies pain with neck movement, numbness, tingling. She is taking gabapentin and tramadol with some improvement. Recently treated for thrush on 11/19/22. She was prescribed magic mouthwash but she has been using this prn. Reports her tongue has been irritated and red since using mouth wash. Hx of allergic rhinitis, burning mouth syndrome, hx of cervical DDD. Has follow up appt with ENT next week.    ROS As per HPI.      Objective:    BP (!) 98/51   Pulse 60   Temp 98.1 F (36.7 C) (Temporal)   Ht 5\' 4"  (1.626 m)   Wt 145 lb (65.8 kg)   SpO2 96%   BMI 24.89 kg/m    Physical Exam Vitals and nursing note reviewed.  Constitutional:      General: She is not in acute distress.    Appearance: She is not ill-appearing, toxic-appearing or diaphoretic.  HENT:     Head: Normocephalic and atraumatic.     Right Ear: Tympanic membrane and ear canal normal.     Left Ear: Tympanic membrane and ear canal normal.     Nose: Rhinorrhea present.     Mouth/Throat:     Tonsils: No tonsillar exudate or tonsillar abscesses. 0 on the right. 0 on the left.     Comments: Thrush present to bilaterally posterior tongue. Eyes:     General:        Right eye: No discharge.        Left eye: No discharge.     Conjunctiva/sclera: Conjunctivae normal.  Cardiovascular:     Rate and Rhythm: Normal rate  and regular rhythm.     Heart sounds: Murmur heard.  Pulmonary:     Effort: Pulmonary effort is normal. No respiratory distress.     Breath sounds: Normal breath sounds. No wheezing, rhonchi or rales.  Musculoskeletal:     Cervical back: Neck supple. No rigidity or tenderness.     Right lower leg: No edema.     Left lower leg: No edema.  Lymphadenopathy:     Cervical: No cervical adenopathy.  Skin:    General: Skin is warm and dry.  Neurological:     Mental Status: She is alert and oriented to person, place, and time. Mental status is at baseline.     No results found for any visits on 12/26/22.      Assessment & Plan:   Carol Richmond was seen today for sore throat.  Diagnoses and all orders for this visit:  Thrush Try clotrimazole as below.  -     clotrimazole (MYCELEX) 10 MG troche; Take 1 tablet (10 mg total) by mouth 5 (five) times daily for 7 days.  Sore throat ? BMS vs thrush vs allergic rhinitis. Continue flonase and xyzal. Clotrimazole for thrush. Discussed symptomatic management of BMS. Keep appt with ENT.  Encounter for immunization -     Flu Vaccine Trivalent High Dose (Fluad)   Return to office for new or worsening symptoms, or if symptoms persist.   The patient indicates understanding of these issues and agrees with the plan.  Gabriel Earing, FNP

## 2022-12-27 ENCOUNTER — Telehealth: Payer: Self-pay | Admitting: *Deleted

## 2022-12-27 ENCOUNTER — Telehealth: Payer: Self-pay | Admitting: Family Medicine

## 2022-12-27 ENCOUNTER — Ambulatory Visit: Payer: Self-pay | Admitting: Family Medicine

## 2022-12-27 ENCOUNTER — Telehealth: Payer: Self-pay

## 2022-12-27 NOTE — Addendum Note (Signed)
Addended by: Sharen Hones on: 12/27/2022 07:10 AM   Modules accepted: Orders

## 2022-12-27 NOTE — Telephone Encounter (Signed)
   Reason for Disposition  Injection site reaction to any vaccine  Answer Assessment - Initial Assessment Questions 1. SYMPTOMS: "What is the main symptom?" (e.g., redness, swelling, pain)      *No Answer* saw a red spot when she took her  bandaid 2. ONSET: "When was the vaccine (shot) given?" "How much later did the *No Answer* begin?" (e.g., hours, days ago)      *No Answer*yesterday 3. SEVERITY: "How bad is it?"      *No Answer*  j 4. FEVER: "Is there a fever?" If Yes, ask: "What is it, how was it measured, and when did it start?"      *No Answer* 5. IMMUNIZATIONS GIVEN: "What shots have you recently received?"     *No Answer* 6. PAST REACTIONS: "Have you reacted to immunizations before?" If Yes, ask: "What happened?"     *No Answer* 7. OTHER SYMPTOMS: "Do you have any other symptoms?"     *No Answer*  Protocols used: Immunization Reactions-A-AH

## 2022-12-27 NOTE — Telephone Encounter (Signed)
Referral sent 

## 2022-12-27 NOTE — Telephone Encounter (Signed)
Copied from CRM [id number]. Topic: Clinical - Medication Question >> Dec 27, 2022  2:36 PM Mosetta Putt H wrote: Reason for CRM: needs to speak with nurse about medication. She can't take clotrimazole (MYCELEX) 10 MG troche.

## 2022-12-27 NOTE — Telephone Encounter (Signed)
Copied from CRM [id number]. Topic: Clinical - Medication Question >> Dec 27, 2022 10:15 AM Raven B wrote: Reason for CRM: PT wants to know if she can take her Tramadol with the warfarin and wants to know if she can have a different prescription sent to the pharmacy. PT call back number [phone]     Per Tiffany it is ok to take Tramadol with the Warfarin and pt is aware.

## 2022-12-29 NOTE — Progress Notes (Deleted)
Assessment/Plan:   Carol Richmond is a very pleasant 72 y.o. year old RH female with a history of hypertension, hyperlipidemia, migraines, GERD, history of partial seizures, IBS, anxiety with benzo dependence, diverticulosis, depression GAD, RCAS, PAD, ICM, Chronic HF, Afib sp DCCV, s/p ablation, history of TIA,  DM2, arthritis,  seen today for evaluation of memory loss. MoCA today is /30.***.  Workup is in progress.  Patient is able to participate on his IADLs and continues to drive without significant difficulties.***    Memory Impairment of unclear etiology  MRI brain without contrast to assess for underlying structural abnormality and assess vascular load  Neurocognitive testing to further evaluate cognitive concerns and determine other underlying cause of memory changes, including potential contribution from sleep, anxiety, attention, or depression  Check B12, TSH Continue to control mood as per PCP Recommend good control of cardiovascular risk factors Folllow up in 1-2  months***  Subjective:   The patient is accompanied by ***  who supplements the history.   How long did patient have memory difficulties? For the last ***.  Reports some difficulty remembering new information, conversations and names.  Long-term memory is good. repeats oneself?  Endorsed Disoriented when walking into a room?  Patient denies except occasionally not remembering what patient came to the room for ***  Leaving objects in unusual places? Denies.   Wandering behavior?  denies .  Any personality changes? High anxiety, fear of living alone. She wants to live with youngest daughter *** Any history of depression?:  Denies   Hallucinations or paranoia?  Denies   Seizures? Hsitory of partial seizures ***  Any sleep changes?   Sleeps well***does not sleep well***denies vivid dreams, REM behavior or sleepwalking   Sleep apnea?  Denies   Any hygiene concerns?  Denies   Independent of bathing and  dressing?  Endorsed  Does the patient needs help with medications? Patient is in charge *** Who is in charge of the finances? Patient is in charge   *** Any changes in appetite?  Denies ***   Patient have trouble swallowing? Denies.   Does the patient cook? ***Not much. Any kitchen accidents such as leaving the stove on? Denies.   Any history of headaches?   Denies.   Chronic pain ? Denies.   Ambulates with difficulty?  Denies. *** Recent falls or head injuries? Denies.   Vision changes? She has a history of stable central retinal iein occlusion R  Unilateral weakness, numbness or tingling? Denies.   Any tremors?   History of orthostatic tremors ***  Any anosmia?  Denies.   Any incontinence of urine? Denies.   Any bowel dysfunction? Denies.      Patient lives with  *** History of heavy alcohol intake? Denies.   History of heavy tobacco use? Denies.   Family history of dementia? Denies.  Does patient drive? Yes ***   Past Medical History:  Diagnosis Date   Asthma    AVNRT (AV nodal re-entry tachycardia) (HCC)    Carotid artery disease (HCC)    Cirrhosis (HCC)    Diverticulosis of colon (without mention of hemorrhage)    Essential hypertension    Functional ovarian cysts    GERD (gastroesophageal reflux disease)    Hemorrhoids, external, without mention of complication    Hormone replacement therapy (postmenopausal)    IBS (irritable bowel syndrome)    Migraine    Mitral valve disease    Pacemaker    Complete heart block -  [employer]   Right knee DJD    Seizures (HCC)    Sleep apnea    TIA (transient ischemic attack)    Type 2 diabetes mellitus [facility])      Past Surgical History:  Procedure Laterality Date   BIOPSY  09/23/2018   Procedure: BIOPSY;  Surgeon: Malissa Hippo, MD;  Location: [facility] ENDO [facility];  Service: Endoscopy;;  duodeneum and stomach   CARDIOVERSION N/A 08/14/2022   Procedure: CARDIOVERSION;  Surgeon: [name], MD;  Location: [facility] INVASIVE CV LAB;   Service: Cardiovascular;  Laterality: N/A;   CHOLECYSTECTOMY  1980   ENDARTERECTOMY Right 07/01/2017   Procedure: ENDARTERECTOMY CAROTID RIGHT;  Surgeon: Nada Libman, MD;  Location: [facility] OR;  Service: Vascular;  Laterality: Right;   ESOPHAGOGASTRODUODENOSCOPY N/A 09/23/2018   Procedure: ESOPHAGOGASTRODUODENOSCOPY (EGD);  Surgeon: Malissa Hippo, MD;  [facility]: [facility];  Service: Endoscopy;  Laterality: N/A;  2:45-moved to 1:45 per Dewayne Hatch   ESOPHAGOGASTRODUODENOSCOPY (EGD) WITH PROPOFOL N/A 03/07/2021   Procedure: ESOPHAGOGASTRODUODENOSCOPY (EGD) WITH PROPOFOL;  Surgeon: Malissa Hippo, MD;  [facility]: [facility];  Service: Endoscopy;  Laterality: N/A;  12:50   LEFT HEART CATHETERIZATION WITH CORONARY ANGIOGRAM N/A 11/18/2013   Procedure: LEFT HEART CATHETERIZATION WITH CORONARY ANGIOGRAM;  Surgeon: Kathleene Hazel, MD;  Location: [facility] CATH LAB;  Service: Cardiovascular;  Laterality: N/A;   OOPHORECTOMY     2012 Dr. Donzetta Matters in [place]   PACEMAKER IMPLANT N/A 12/14/2021   Procedure: PACEMAKER IMPLANT;  Surgeon: Marinus Maw, MD;  Location: [facility] INVASIVE CV LAB;  Service: Cardiovascular;  Laterality: N/A;   PATCH ANGIOPLASTY Right 07/01/2017   Procedure: RIGHT CAROTID PATCH ANGIOPLASTY;  Surgeon: Nada Libman, MD;  Location: [facility] OR;  Service: Vascular;  Laterality: Right;   SVT ABLATION N/A 12/14/2021   Procedure: SVT ABLATION;  Surgeon: Marinus Maw, MD;  Location: [facility] INVAS[facility] CV LAB;  Service: Cardiovascular;  Laterality: N/A;   TEE WITHOUT CARDIOVERSION N/A 03/01/2022   Procedure: TRANSESOPHAGEAL ECHOCARDIOGRAM (TEE);  Surgeon: Thurmon Fair, MD;  Location: [facility] ENDOSCOPY;  Service: Cardiovascular;  Laterality: N/A;   TEE WITHOUT CARDIOVERSION N/A 08/14/2022   Procedure: TRANSESOPHAGEAL ECHOCARDIOGRAM;  Surgeon: [name], MD;  Location: [facility] INVASIVE CV LAB;  Service: Cardiovascular;  Laterality: N/A;   TONSILLECTOMY     TOTAL KNEE ARTHROPLASTY Right 09/02/2012   Procedure:  TOTAL KNEE ARTHROPLASTY- right;  Surgeon: Loreta Ave, MD;  Location: [facility] OR;  Service: Orthopedics;  Laterality: Right;   TUBAL LIGATION     TYMPANOMASTOIDECTOMY Left 04/02/2016   Procedure: LEFT CANAL WALL DOWN TYMPANOMASTOIDECTOMY;  Surgeon: Newman Pies, MD;  Location: [facility];  Service: ENT;  Laterality: Left;   VAGINAL HYSTERECTOMY  1990   WOUND EXPLORATION Right 07/02/2017   Procedure: RIGHT NECK EXPLORATION WITH INTRAOPERATIVE ULTRASOUND;  Surgeon: Chuck Hint, MD;  Location: [facility] OR;  Service: Vascular;  Laterality: Right;     No Known Allergies  Current Outpatient Medications  Medication Instructions   Accu-Chek Softclix Lancets lancets check blood sugars twice daily DX E11.65   acetaminophen (TYLENOL) 500 mg, Oral, Daily PRN   amiodarone (PACERONE) 200 mg, Oral, Daily   Azelastine HCl 0.15 % SOLN    Blood Pressure Monitoring (OMRON 3 SERIES BP MONITOR) DEVI Use as directed   citalopram (CELEXA) 10 mg, Oral, Daily   clotrimazole (MYCELEX) 10 mg, Oral, 5 times daily   fluticasone (FLONASE) 50 MCG/ACT nasal spray 2 sprays, Each Nare, Daily   furosemide (LASIX)  40 mg, Oral, Daily   gabapentin (NEURONTIN) 300 mg, Oral, 3 times daily PRN   glucose blood (ACCU-CHEK GUIDE) test strip USE AS INSTRUCTED TO TEST BLOOD SUGAR DAILY AS DIRECTED.   lactulose (CHRONULAC) 10 GM/15ML solution take 30 mls (20 gram total) by mouth 3 times a day.   lamoTRIgine (LAMICTAL) 200 MG tablet TAKE (1) TABLET TWICE DAILY.   LORazepam (ATIVAN) 0.5 mg, Oral, 2 times daily PRN   magic mouthwash (lidocaine, diphenhydrAMINE, alum & mag hydroxide) suspension 5 mLs, Swish & Swallow, 3 times daily PRN   magic mouthwash (lidocaine, diphenhydrAMINE, alum & mag hydroxide) suspension 10 mLs, Swish & Swallow, 3 times daily   metFORMIN (GLUCOPHAGE) 500 mg, Oral, 2 times daily with meals   metoprolol tartrate (LOPRESSOR) 12.5 mg, Oral, 2 times daily   montelukast (SINGULAIR) 10 mg, Oral, Daily at  bedtime   ondansetron (ZOFRAN) 4 MG tablet take 1 tablet by mouth every 8 hours as needed for nausea and vomiting.   pantoprazole (PROTONIX) 40 MG tablet take (1) tablet twice daily.   potassium chloride SA (KLOR-CON M) 20 MEQ tablet 40 mEq, Oral, Daily   sacubitril-valsartan (ENTRESTO) 24-26 MG 1 tablet, Oral, 2 times daily   traMADol (ULTRAM) 50 mg, Oral, Every 6 hours PRN   traZODone (DESYREL) 50 mg, Oral, At bedtime PRN   warfarin (COUMADIN) 5 MG tablet Take 1 tablet daily except 1/2 tablet on Mondays, Wednesdays and Fridays or as directed     VITALS:  There were no vitals filed for this visit.    PHYSICAL EXAM   HEENT:  Normocephalic, atraumatic. The superficial temporal arteries are without ropiness or tenderness. Cardiovascular: Regular rate and rhythm. 2/6 SM Lungs: Clear to auscultation bilaterally. Neck: There are no carotid bruits noted bilaterally.  NEUROLOGICAL:     No data to display             11/01/2022    1:05 PM  MMSE - Mini Mental State Exam  Orientation to time 3  Orientation to Place 2  Registration 2  Attention/ Calculation 0  Recall 1  Language- name 2 objects 2  Language- repeat 1  Language- follow 3 step command 0  Language- read & follow direction 0  Write a sentence 0  Copy design 0  Total score 11     Orientation:  Alert and oriented to person, place and time. No aphasia or dysarthria. Fund of knowledge is appropriate. Recent memory impaired and remote memory intact.  Attention and concentration are normal.  Able to name objects and repeat phrases. Delayed recall  /5 Cranial nerves: There is good facial symmetry. Extraocular muscles are intact and visual fields are full to confrontational testing. Speech is fluent and clear. No tongue deviation. Hearing is intact to conversational tone. Tone: Tone is good throughout. Sensation: Sensation is intact to light touch. Vibration is intact at the bilateral big toe.  Coordination: The patient has  no difficulty with RAM's or FNF bilaterally. Normal finger to nose  Motor: Strength is 5/5 in the bilateral upper and lower extremities. There is no pronator drift. There are no fasciculations noted. DTR's: Deep tendon reflexes are 2/4 bilaterally. Gait and Station: The patient is able to ambulate without difficulty The patient is able to heel toe walk . Gait is cautious and narrow. The patient is able to ambulate in a tandem fashion.       Thank you for allowing [place] the opportunity to participate in the care of this nice patient.  Please do not hesitate to contact us for any questions or concerns.   Total time spent on today's visit was *** minutes dedicated to this patient today, preparing to see patient, examining the patient, ordering tests and/or medications and counseling the patient, documenting clinical information in the EHR or other health record, independently interpreting results and communicating results to the patient/family, discussing treatment and goals, answering patient's questions and coordinating care.  Cc:  Gabriel Earing, FNP  Marlowe Kays 12/29/2022 1:42 PM

## 2022-12-30 ENCOUNTER — Encounter: Payer: Self-pay | Admitting: Physician Assistant

## 2022-12-30 ENCOUNTER — Telehealth: Payer: Self-pay | Admitting: Family Medicine

## 2022-12-30 MED ORDER — NYSTATIN 100000 UNIT/ML MT SUSP: 5.0000 mL | Freq: Four times a day (QID) | OROMUCOSAL | 0 refills | Status: AC

## 2022-12-30 NOTE — Telephone Encounter (Signed)
Per Tiffany-ok to send in nystatin suspension, swish and swallow. Rx sent in and pt is aware.

## 2022-12-30 NOTE — Telephone Encounter (Signed)
I spoke with pt and she is just pushing her appt off till after the first of the year and PCP is aware.

## 2022-12-30 NOTE — Addendum Note (Signed)
Addended by: Hessie Diener on: 12/30/2022 09:51 AM   Modules accepted: Orders

## 2022-12-31 ENCOUNTER — Other Ambulatory Visit: Payer: Self-pay | Admitting: Nurse Practitioner

## 2022-12-31 ENCOUNTER — Ambulatory Visit: Payer: Self-pay | Admitting: Family Medicine

## 2022-12-31 ENCOUNTER — Other Ambulatory Visit: Payer: Self-pay | Admitting: Family Medicine

## 2022-12-31 DIAGNOSIS — R569 Unspecified convulsions: Secondary | ICD-10-CM

## 2022-12-31 NOTE — Telephone Encounter (Signed)
Copied from CRM [id number]. Topic: Clinical - Pink Word Triage >> Dec 27, 2022  8:38 AM Carol Richmond R wrote: Reason for Triage: Pt mentioned outer throat pain, and from the flu shot she has a reaction ( red, pink)

## 2022-12-31 NOTE — Telephone Encounter (Signed)
Copied from CRM [id number]. Topic: Clinical - Pink Word Triage >> Dec 27, 2022  8:38 AM Lovey Newcomer R wrote: Reason for Triage: Pt mentioned outer throat pain, and from the flu shot she has a reaction ( red, pink)

## 2022-12-31 NOTE — Telephone Encounter (Signed)
Copied from CRM [id number]. Topic: Clinical - Pink Word Triage >> Dec 27, 2022  8:38 AM Carol Richmond wrote: Reason for Triage: Pt mentioned outer throat pain, and from the flu shot she has a reaction ( red, pink) Reason for Disposition . Injection site reaction to any vaccine  Answer Assessment - Initial Assessment Questions 1. SYMPTOMS: "What is the main symptom?" (e.g., redness, swelling, pain)      Outer throat pain 2. ONSET: "When was the vaccine (shot) given?" "How much later did the  begin?" (e.g., hours, days ago)     12/27/2022 6. PAST REACTIONS: "Have you reacted to immunizations before?no     7. OTHER SYMPTOMS: "Do you have any other symptoms?"      Reddish pink spot on the area where the shot was given.  Protocols used: Immunization Reactions-A-AH

## 2023-01-01 ENCOUNTER — Ambulatory Visit: Payer: Medicare HMO

## 2023-01-01 ENCOUNTER — Ambulatory Visit: Payer: Medicare HMO | Admitting: Physician Assistant

## 2023-01-02 ENCOUNTER — Encounter: Payer: Self-pay | Admitting: Physician Assistant

## 2023-01-06 ENCOUNTER — Encounter: Payer: Self-pay | Admitting: Family Medicine

## 2023-01-06 ENCOUNTER — Ambulatory Visit: Payer: Medicare HMO | Attending: Cardiology | Admitting: *Deleted

## 2023-01-06 ENCOUNTER — Ambulatory Visit (INDEPENDENT_AMBULATORY_CARE_PROVIDER_SITE_OTHER): Payer: Medicare HMO | Admitting: Family Medicine

## 2023-01-06 VITALS — BP 106/61 | HR 65 | Temp 97.7°F | Ht 64.0 in | Wt 142.2 lb

## 2023-01-06 DIAGNOSIS — I4891 Unspecified atrial fibrillation: Secondary | ICD-10-CM

## 2023-01-06 DIAGNOSIS — Z5181 Encounter for therapeutic drug level monitoring: Secondary | ICD-10-CM

## 2023-01-06 DIAGNOSIS — F1320 Sedative, hypnotic or anxiolytic dependence, uncomplicated: Secondary | ICD-10-CM

## 2023-01-06 DIAGNOSIS — F411 Generalized anxiety disorder: Secondary | ICD-10-CM | POA: Diagnosis not present

## 2023-01-06 DIAGNOSIS — F339 Major depressive disorder, recurrent, unspecified: Secondary | ICD-10-CM | POA: Diagnosis not present

## 2023-01-06 DIAGNOSIS — R8290 Unspecified abnormal findings in urine: Secondary | ICD-10-CM | POA: Diagnosis not present

## 2023-01-06 DIAGNOSIS — Z79899 Other long term (current) drug therapy: Secondary | ICD-10-CM | POA: Diagnosis not present

## 2023-01-06 DIAGNOSIS — K146 Glossodynia: Secondary | ICD-10-CM

## 2023-01-06 LAB — URINALYSIS, ROUTINE W REFLEX MICROSCOPIC
Bilirubin, UA: NEGATIVE
Glucose, UA: NEGATIVE
Ketones, UA: NEGATIVE
Nitrite, UA: NEGATIVE
Protein,UA: NEGATIVE
RBC, UA: NEGATIVE
Specific Gravity, UA: 1.015 (ref 1.005–1.030)
Urobilinogen, Ur: 0.2 mg/dL (ref 0.2–1.0)
pH, UA: 7 (ref 5.0–7.5)

## 2023-01-06 LAB — MICROSCOPIC EXAMINATION
Renal Epithel, UA: NONE SEEN /[HPF]
Yeast, UA: NONE SEEN

## 2023-01-06 LAB — POCT INR: INR: 2.3 (ref 2.0–3.0)

## 2023-01-06 MED ORDER — CITALOPRAM HYDROBROMIDE 20 MG PO TABS: 20.0000 mg | ORAL_TABLET | Freq: Every day | ORAL | 3 refills | Status: AC

## 2023-01-06 MED ORDER — LORAZEPAM 0.5 MG PO TABS: 0.5000 mg | ORAL_TABLET | Freq: Two times a day (BID) | ORAL | 0 refills | Status: DC | PRN

## 2023-01-06 NOTE — Progress Notes (Signed)
Acute Office Visit  Subjective:     [name], female    DOB: [birth year], 72 y.o.   MRN: [id number]  Chief Complaint  Patient presents with   Urinary Tract Infection    HPI Windell reports urinary odor for 3 days. Denies urgency, frequency, hematuria, flank pain, vaginal discharge, dysuria, fever, chills, nausea, or vomiting. Reports she is well hydrated.   Has been having burning tongue pain. Has been taking nystatin suspension for thrush with good relief. She canceled ENT appt but will reschedule this.   Reports anxiety is not well controlled. She feels anxious all the time. Taking lorazepam BID. She has been taking celexa 10 mg daily but doesn't feel like it has been helpful.       01/06/2023    1:06 PM 12/26/2022   11:06 AM 10/28/2022   10:23 AM  Depression screen PHQ 2/9  Decreased Interest 0 0 0  Down, Depressed, Hopeless 1 0 0  PHQ - 2 Score 1 0 0  Altered sleeping 1 0 0  Tired, decreased energy 0 0 0  Change in appetite 0 0 0  Feeling bad or failure about yourself  0 0 0  Trouble concentrating 0 0 0  Moving slowly or fidgety/restless 0 0 0  Suicidal thoughts 0 0 0  PHQ-9 Score 2 0 0  Difficult doing work/chores Not difficult at all Not difficult at all Not difficult at all      01/06/2023    1:05 PM 12/26/2022   11:05 AM 10/28/2022   10:22 AM 10/23/2022    1:31 PM  GAD 7 : Generalized Anxiety Score  Nervous, Anxious, on Edge 3 3 0 0  Control/stop worrying 0 0 0 0  Worry too much - different things 0 0 0 0  Trouble relaxing 0 0 0 0  Restless 0 0 0 0  Easily annoyed or irritable 0 0 0 0  Afraid - awful might happen 0 0 0 0  Total GAD 7 Score 3 3 0 0  Anxiety Difficulty Not difficult at all Not difficult at all Not difficult at all Not difficult at all     ROS As per HPI.     Objective:    BP 106/61   Pulse 65   Temp 97.7 F (36.5 C) (Temporal)   Ht 5\' 4"  (1.626 m)   Wt 142 lb 4 oz (64.5 kg)   SpO2 97%   BMI 24.42 kg/m  Wt  Readings from Last 3 Encounters:  01/06/23 142 lb 4 oz (64.5 kg)  12/26/22 145 lb (65.8 kg)  12/06/22 144 lb 9.6 oz (65.6 kg)      Physical Exam Vitals and nursing note reviewed.  Constitutional:      General: She is not in acute distress.    Appearance: She is not ill-appearing, toxic-appearing or diaphoretic.  HENT:     Right Ear: Tympanic membrane, ear canal and external ear normal.     Left Ear: Tympanic membrane, ear canal and external ear normal.     Nose: Rhinorrhea present.     Mouth/Throat:     Mouth: Mucous membranes are moist.     Pharynx: Oropharynx is clear.     Comments: No thrush present Cardiovascular:     Rate and Rhythm: Normal rate and regular rhythm.     Heart sounds: Normal heart sounds. No murmur heard. Pulmonary:     Effort: Pulmonary effort is normal. No respiratory distress.  Breath sounds: Normal breath sounds.  Musculoskeletal:     Cervical back: Neck supple. No rigidity.  Neurological:     Mental Status: She is alert and oriented to person, place, and time. Mental status is at baseline.  Psychiatric:        Attention and Perception: Attention normal.        Mood and Affect: Mood is anxious.        Speech: Speech normal.        Behavior: Behavior normal.        Cognition and Memory: Cognition normal.     Results for orders placed or performed in visit on 01/06/23  POCT INR  Result Value Ref Range   INR 2.3 2.0 - 3.0   POC INR          Assessment & Plan:   Kaidence was seen today for urinary tract infection.  Diagnoses and all orders for this visit:  Bad odor of urine Discussed hydration. Will notify when culture results are available. She will notify me if symptoms worsen in the meantime.  -     Urinalysis, Routine w reflex microscopic -     Urine Culture  Depression, recurrent (HCC) Generalized anxiety disorder Increase celexa to 20 mg daily.  -     citalopram (CELEXA) 20 MG tablet; Take 1 tablet (20 mg total) by mouth  daily. -     LORazepam (ATIVAN) 0.5 MG tablet; Take 1 tablet (0.5 mg total) by mouth 2 (two) times daily as needed for anxiety.  Benzodiazepine dependence (HCC) Controlled substance agreement signed PDMP reviewed, no red flags. Refill provided to be filled on 01/16/23. Has upcoming chronic follow up for further refills. UDS and CSA are UTD.  -     LORazepam (ATIVAN) 0.5 MG tablet; Take 1 tablet (0.5 mg total) by mouth 2 (two) times daily as needed for anxiety.  Burning tongue syndrome Schedule follow up with ENT.    Keep scheduled follow ups.   The patient indicates understanding of these issues and agrees with the plan.  Gabriel Earing, FNP

## 2023-01-06 NOTE — Patient Instructions (Signed)
Continue warfarin 1 tablet daily except 1/2 tablet Mondays, Wednesdays and Fridays Recheck in 4 wks

## 2023-01-06 NOTE — Progress Notes (Signed)
Remote pacemaker transmission.   

## 2023-01-07 ENCOUNTER — Institutional Professional Consult (permissible substitution) (INDEPENDENT_AMBULATORY_CARE_PROVIDER_SITE_OTHER): Payer: Medicare HMO

## 2023-01-07 ENCOUNTER — Telehealth: Payer: Self-pay | Admitting: Family Medicine

## 2023-01-07 ENCOUNTER — Ambulatory Visit (INDEPENDENT_AMBULATORY_CARE_PROVIDER_SITE_OTHER): Payer: Medicare HMO

## 2023-01-07 NOTE — Telephone Encounter (Signed)
Copied from CRM [id number]. Topic: Clinical - Lab/Test Results >> Jan 07, 2023 10:07 AM Tiffany H wrote: Reason for CRM: Patient called for lab test results from 01/06/23. Indicated that she cannot move forward with a medication without those test results. Please assist. She would like to speak with Tiffany.  [phone]

## 2023-01-07 NOTE — Telephone Encounter (Signed)
Copied from CRM [id number]. Topic: Clinical - Lab/Test Results >> Jan 07, 2023  9:26 AM Roswell Nickel wrote: Reason for CRM  Patient calling regarding test result from yesterday, she wants someone to call her at [phone]

## 2023-01-08 ENCOUNTER — Other Ambulatory Visit: Payer: Self-pay | Admitting: Family Medicine

## 2023-01-08 DIAGNOSIS — N3000 Acute cystitis without hematuria: Secondary | ICD-10-CM

## 2023-01-08 MED ORDER — CEPHALEXIN 500 MG PO CAPS: 500.0000 mg | ORAL_CAPSULE | Freq: Two times a day (BID) | ORAL | 0 refills | Status: DC

## 2023-01-08 NOTE — Telephone Encounter (Signed)
I spoke to pt and advised we don't have the urine culture results back yet.

## 2023-01-09 LAB — URINE CULTURE

## 2023-01-13 NOTE — Telephone Encounter (Signed)
Pt aware of provider feedback and voiced understanding.

## 2023-01-13 NOTE — Telephone Encounter (Signed)
Copied from CRM [id number]. Topic: Clinical - Medication Question >> Jan 13, 2023  9:06 AM Desma Mcgregor wrote: Reason for CRM: Pt requesting to speak with the nurse. She stated that she has anxiety and asking if she can take 2 tablets of the citalopram (CELEXA) 20 MG tablet for now, as she is unable to get her LORazepam (ATIVAN) 0.5 MG tablet (Starting on 01/16/2023). CB# [phone].

## 2023-01-13 NOTE — Telephone Encounter (Signed)
Ok to increase her celexa to 40mg ?

## 2023-01-13 NOTE — Telephone Encounter (Signed)
Not yet. I just increased her celexa from 10 mg to 20 mg at her visit on 01/06/23. Need to give this change 4-6 weeks before adjusting again.

## 2023-01-14 DIAGNOSIS — M5030 Other cervical disc degeneration, unspecified cervical region: Secondary | ICD-10-CM | POA: Diagnosis not present

## 2023-01-14 DIAGNOSIS — M5412 Radiculopathy, cervical region: Secondary | ICD-10-CM | POA: Diagnosis not present

## 2023-01-18 DIAGNOSIS — R590 Localized enlarged lymph nodes: Secondary | ICD-10-CM | POA: Diagnosis not present

## 2023-01-18 DIAGNOSIS — K146 Glossodynia: Secondary | ICD-10-CM | POA: Diagnosis not present

## 2023-01-20 NOTE — Telephone Encounter (Signed)
Take as prescribed by UC. Keep appt with MMM tomorrow for further instructions.

## 2023-01-20 NOTE — Telephone Encounter (Signed)
I spoke with pt and advised her to continue with meds as prescribed by UC and keep appt with Dr Reece Agar tomorrow and discuss further. Pt voiced understanding.

## 2023-01-20 NOTE — Telephone Encounter (Signed)
Lmtcb.

## 2023-01-20 NOTE — Telephone Encounter (Signed)
Pt r/c.

## 2023-01-20 NOTE — Telephone Encounter (Signed)
Copied from CRM [id number]. Topic: Clinical - Medical Advice >> Jan 20, 2023  3:16 PM Fuller Mandril wrote: Reason for CRM: Pt called states she was seen at urgent care for her mouth. She was prescribed cephALEXin (KEFLEX) 500 MG capsule. Pharmacy told her to take 4 times per day. She just started them and has already taking 14 of the same medication prescribed on 11/20. Wanted to know if she should take any more or if she should wait until appt tomorrow.

## 2023-01-21 ENCOUNTER — Ambulatory Visit (INDEPENDENT_AMBULATORY_CARE_PROVIDER_SITE_OTHER): Payer: Medicare HMO | Admitting: Nurse Practitioner

## 2023-01-21 ENCOUNTER — Encounter: Payer: Self-pay | Admitting: Nurse Practitioner

## 2023-01-21 VITALS — BP 126/71 | HR 67 | Temp 97.1°F | Resp 20 | Ht 64.0 in | Wt 140.0 lb

## 2023-01-21 DIAGNOSIS — B370 Candidal stomatitis: Secondary | ICD-10-CM

## 2023-01-21 DIAGNOSIS — R590 Localized enlarged lymph nodes: Secondary | ICD-10-CM | POA: Diagnosis not present

## 2023-01-21 MED ORDER — PREDNISONE 20 MG PO TABS: 40.0000 mg | ORAL_TABLET | Freq: Every day | ORAL | 0 refills | Status: AC

## 2023-01-21 NOTE — Progress Notes (Signed)
Subjective:    [name], female    DOB: July 20, 1950, 72 y.o.   MRN: [id number]   Chief Complaint: urgent care follow up  HPI  Patient went to urgent care follow up. She was given keflex for surgical lymph adneopathy. She took 2 doses and then stopped it because she wanted a second opinion. She still as some lymph pain in eck and her mouth was burning this morning. Patient Active Problem List   Diagnosis Date Noted   Anxiety 10/28/2022   Atherosclerosis of native artery of right lower extremity with intermittent claudication (HCC) 10/28/2022   Long term current use of anticoagulant 10/23/2022   UTI (urinary tract infection) 10/17/2022   AKI (acute kidney injury) (HCC) 10/16/2022   Esophageal varices (HCC) 09/30/2022   Encounter for therapeutic drug monitoring 08/20/2022   Cold sore 08/12/2022   Atrial fibrillation (HCC) 08/11/2022   Atrial flutter (HCC) 08/11/2022   Encounter to discuss test results 08/07/2022   Hyperlipidemia associated with type 2 diabetes mellitus (HCC) 07/22/2022   Lymphadenopathy 07/10/2022   Burning tongue syndrome 03/28/2022   S/P placement of cardiac pacemaker 12/28/2021   Heart block AV complete (HCC) 12/15/2021   LBBB (left bundle branch block) 12/15/2021   Controlled substance agreement signed 12/03/2021   Mitral stenosis with regurgitation 09/30/2021   Wide-complex tachycardia 09/30/2021   SVT (supraventricular tachycardia) (HCC) 09/28/2021   Chronic HFrEF (heart failure with reduced ejection fraction) (HCC) 09/28/2021   Hypokalemia 09/28/2021   Moderate nonproliferative diabetic retinopathy of left eye, associated with type 2 diabetes mellitus (HCC) 09/20/2021   Posterior vitreous detachment of left eye 09/20/2021   Stable central retinal vein occlusion of right eye 09/20/2021   Hypertensive retinopathy of both eyes, grade 1 09/20/2021   Nuclear sclerotic cataract of left eye 09/20/2021   Benzodiazepine dependence (HCC)  05/15/2021   Angina, class III (HCC) 06/27/2020   Non-alcoholic cirrhosis (HCC) 03/28/2020   Acute metabolic encephalopathy 03/28/2020   Cervical spondylosis 06/07/2019   Peptic ulcer disease 01/25/2019   Iron deficiency anemia 01/25/2019   Fatty liver disease, nonalcoholic 01/25/2019   Cardiomyopathy (HCC) 08/07/2017   Non-rheumatic mitral regurgitation 08/07/2017   Non-rheumatic tricuspid valve insufficiency 08/07/2017   PAD (peripheral artery disease) (HCC) 08/07/2017   Type 2 diabetes mellitus with hyperglycemia, without long-term current use of insulin (HCC) 08/07/2017   Carotid artery stenosis, symptomatic, right 07/01/2017   Stenosis of right carotid artery 06/23/2017   Orthostatic tremor 06/23/2017   Generalized anxiety disorder 07/01/2016   Mild neurocognitive disorder 05/02/2016   Migraine without aura and without status migrainosus, not intractable 01/27/2015   Depression, recurrent (HCC) 07/25/2014   Chronic daily headache 12/03/2013   Osteoarthritis of right knee 09/03/2012   Right knee DJD    IBS (irritable bowel syndrome)    Diverticulosis of colon    GERD (gastroesophageal reflux disease)    Hypertension associated with type 2 diabetes mellitus (HCC)    Partial seizure (HCC)    External hemorrhoids        Review of Systems  Constitutional:  Negative for diaphoresis.  HENT:  Negative for congestion and rhinorrhea.   Eyes:  Negative for pain.  Respiratory:  Negative for shortness of breath.   Cardiovascular:  Negative for chest pain, palpitations and leg swelling.  Gastrointestinal:  Negative for abdominal pain.  Endocrine: Negative for polydipsia.  Skin:  Negative for rash.  Neurological:  Negative for dizziness, weakness and headaches.  Hematological:  Does not bruise/bleed easily.  All other systems reviewed and are negative.      Objective:   Physical Exam Constitutional:      Appearance: Normal appearance.  HENT:     Nose: Congestion present.      Mouth/Throat:     Comments: White film on tongue Cardiovascular:     Rate and Rhythm: Normal rate and regular rhythm.     Heart sounds: Normal heart sounds.  Pulmonary:     Effort: Pulmonary effort is normal.     Breath sounds: Normal breath sounds.  Musculoskeletal:     Cervical back: Normal range of motion and neck supple. Tenderness present.  Lymphadenopathy:     Cervical: No cervical adenopathy.  Skin:    General: Skin is warm.  Neurological:     General: No focal deficit present.     Mental Status: She is alert and oriented to person, place, and time.  Psychiatric:        Mood and Affect: Mood normal.        Behavior: Behavior normal.     BP 126/71   Pulse 67   Temp (!) 97.1 F (36.2 C) (Temporal)   Resp 20   Ht 5\' 4"  (1.626 m)   Wt 140 lb (63.5 kg)   SpO2 100%   BMI 24.03 kg/m        Assessment & Plan:   Carol Richmond in today with chief complaint of No chief complaint on file.   1. Cervical lymphadenopathy Force fluids Rest RTO prn - predniSONE (DELTASONE) 20 MG tablet; Take 2 tablets (40 mg total) by mouth daily with breakfast for 5 days. 2 po daily for 5 days  Dispense: 10 tablet; Refill: 0  2. Thrush Use your dukes magic mouthwash QID    The above assessment and management plan was discussed with the patient. The patient verbalized understanding of and has agreed to the management plan. Patient is aware to call the clinic if symptoms persist or worsen. Patient is aware when to return to the clinic for a follow-up visit. Patient educated on when it is appropriate to go to the emergency department.   Mary-Margaret Daphine Deutscher, FNP

## 2023-01-21 NOTE — Patient Instructions (Signed)

## 2023-01-22 NOTE — Telephone Encounter (Signed)
Called and spoke with patient she is aware it was given to her for her neck pain. Patient verbalized understanding.

## 2023-01-22 NOTE — Telephone Encounter (Signed)
Copied from CRM [id number]. Topic: Clinical - Medication Question >> Jan 22, 2023  9:12 AM Carol Richmond wrote: Reason for CRM: Patient called in wanting to know why does she need to take Prednisone and also know if it's another antibiotic

## 2023-01-24 ENCOUNTER — Ambulatory Visit: Payer: Medicare HMO | Admitting: Family Medicine

## 2023-01-27 ENCOUNTER — Other Ambulatory Visit: Payer: Self-pay | Admitting: Family Medicine

## 2023-01-27 ENCOUNTER — Other Ambulatory Visit (INDEPENDENT_AMBULATORY_CARE_PROVIDER_SITE_OTHER): Payer: Self-pay | Admitting: Gastroenterology

## 2023-01-27 ENCOUNTER — Encounter: Payer: Self-pay | Admitting: Family Medicine

## 2023-01-27 ENCOUNTER — Telehealth (INDEPENDENT_AMBULATORY_CARE_PROVIDER_SITE_OTHER): Payer: Self-pay | Admitting: *Deleted

## 2023-01-27 ENCOUNTER — Ambulatory Visit (INDEPENDENT_AMBULATORY_CARE_PROVIDER_SITE_OTHER): Payer: Medicare HMO | Admitting: Family Medicine

## 2023-01-27 ENCOUNTER — Other Ambulatory Visit: Payer: Medicare HMO

## 2023-01-27 ENCOUNTER — Telehealth: Payer: Self-pay | Admitting: Family Medicine

## 2023-01-27 VITALS — BP 97/55 | HR 63 | Temp 97.4°F | Ht 64.0 in | Wt 140.1 lb

## 2023-01-27 DIAGNOSIS — Z79899 Other long term (current) drug therapy: Secondary | ICD-10-CM | POA: Diagnosis not present

## 2023-01-27 DIAGNOSIS — G8929 Other chronic pain: Secondary | ICD-10-CM

## 2023-01-27 DIAGNOSIS — F411 Generalized anxiety disorder: Secondary | ICD-10-CM

## 2023-01-27 DIAGNOSIS — F1320 Sedative, hypnotic or anxiolytic dependence, uncomplicated: Secondary | ICD-10-CM | POA: Diagnosis not present

## 2023-01-27 DIAGNOSIS — I5022 Chronic systolic (congestive) heart failure: Secondary | ICD-10-CM

## 2023-01-27 DIAGNOSIS — E1169 Type 2 diabetes mellitus with other specified complication: Secondary | ICD-10-CM | POA: Diagnosis not present

## 2023-01-27 DIAGNOSIS — E1159 Type 2 diabetes mellitus with other circulatory complications: Secondary | ICD-10-CM | POA: Diagnosis not present

## 2023-01-27 DIAGNOSIS — K1370 Unspecified lesions of oral mucosa: Secondary | ICD-10-CM

## 2023-01-27 DIAGNOSIS — L608 Other nail disorders: Secondary | ICD-10-CM | POA: Diagnosis not present

## 2023-01-27 DIAGNOSIS — R569 Unspecified convulsions: Secondary | ICD-10-CM

## 2023-01-27 DIAGNOSIS — E1165 Type 2 diabetes mellitus with hyperglycemia: Secondary | ICD-10-CM

## 2023-01-27 DIAGNOSIS — E785 Hyperlipidemia, unspecified: Secondary | ICD-10-CM | POA: Diagnosis not present

## 2023-01-27 DIAGNOSIS — I152 Hypertension secondary to endocrine disorders: Secondary | ICD-10-CM | POA: Diagnosis not present

## 2023-01-27 DIAGNOSIS — F339 Major depressive disorder, recurrent, unspecified: Secondary | ICD-10-CM | POA: Diagnosis not present

## 2023-01-27 DIAGNOSIS — K146 Glossodynia: Secondary | ICD-10-CM

## 2023-01-27 LAB — BAYER DCA HB A1C WAIVED: HB A1C (BAYER DCA - WAIVED): 5.6 % (ref 4.8–5.6)

## 2023-01-27 LAB — LIPID PANEL

## 2023-01-27 MED ORDER — LORAZEPAM 0.5 MG PO TABS
0.5000 mg | ORAL_TABLET | Freq: Two times a day (BID) | ORAL | 5 refills | Status: DC | PRN
Start: 2023-02-15 — End: 2023-07-30

## 2023-01-27 MED ORDER — LIDOCAINE VISCOUS HCL 2 % MT SOLN: 10.0000 mL | Freq: Three times a day (TID) | OROMUCOSAL | 0 refills | Status: DC

## 2023-01-27 NOTE — Progress Notes (Signed)
Established Patient Office Visit  Subjective   [name], female    DOB: 05/08/1950  Age: 72 y.o. MRN: [id number]  Chief Complaint  Patient presents with   Medical Management of Chronic Issues   Diabetes   Hyperlipidemia   Hypertension    HPI  DM Pt presents for follow up evaluation of Type 2 diabetes mellitus. Patient denies foot ulcerations, increased appetite, nausea, paresthesia of the feet, polydipsia, polyuria, visual disturbances, vomiting, and weight loss.  Current diabetic medications include: metformin 500 mg BID Compliant with meds - Yes  Current monitoring regimen: not checking right now Home blood sugar records: not checking currently Any episodes of hypoglycemia? no  2. HTN/ A fib/ CHF Complaint with meds - Yes Current Medications -  amiodarone, lasix, metoprolol, entreso, coumadin Pertinent ROS:  Headache - No Fatigue - No Visual Disturbances - No Chest pain - No Dyspnea - No Palpitations - No LE edema - No  Established with cards.   3.  HLD She has declined medications. Regular diet. No exercise.   4. Seizure disorder Compliant with lamictal. She hasn't had any seizure activity. Has upcoming appt neurology.   5. Anxiety/depression Compliant with celexa. Increased dosage 3 weeks ago. Continues to feel anxious all the time, worries about a lot of different things. Taking ativan BID prn. Stable symptoms. Denies SI. She has been more active socially lately with church. Feels lonely. She had declined referral for counseling numerous times.   6. Throat, mouth pain She continues to have throat and mouth pain. Burning pain. She cancelled ENT appt and now has a follow up on 12/30. She started taking her gabapentin regularly last week with no improvement yet. Recently treated with keflex and prednisone for ? cervical adenopathy and duke's magic mouth wash for trush. Reports no improvement in pain with this. Hx of BMS.      01/27/2023    10:50 AM 01/06/2023    1:06 PM 12/26/2022   11:06 AM  Depression screen PHQ 2/9  Decreased Interest 0 0 0  Down, Depressed, Hopeless 0 1 0  PHQ - 2 Score 0 1 0  Altered sleeping 0 1 0  Tired, decreased energy 0 0 0  Change in appetite 0 0 0  Feeling bad or failure about yourself  0 0 0  Trouble concentrating 0 0 0  Moving slowly or fidgety/restless 0 0 0  Suicidal thoughts 0 0 0  PHQ-9 Score 0 2 0  Difficult doing work/chores Not difficult at all Not difficult at all Not difficult at all      01/27/2023   10:50 AM 01/06/2023    1:05 PM 12/26/2022   11:05 AM 10/28/2022   10:22 AM  GAD 7 : Generalized Anxiety Score  Nervous, Anxious, on Edge 3 3 3  0  Control/stop worrying 0 0 0 0  Worry too much - different things 0 0 0 0  Trouble relaxing 0 0 0 0  Restless 0 0 0 0  Easily annoyed or irritable 0 0 0 0  Afraid - awful might happen 0 0 0 0  Total GAD 7 Score 3 3 3  0  Anxiety Difficulty Not difficult at all Not difficult at all Not difficult at all Not difficult at all      Past Medical History:  Diagnosis Date   Asthma    AVNRT (AV nodal re-entry tachycardia) (HCC)    Carotid artery disease (HCC)    Cirrhosis (HCC)  Diverticulosis of colon (without mention of hemorrhage)    Essential hypertension    Functional ovarian cysts    GERD (gastroesophageal reflux disease)    Hemorrhoids, external, without mention of complication    Hormone replacement therapy (postmenopausal)    IBS (irritable bowel syndrome)    Migraine    Mitral valve disease    Pacemaker    Complete heart block - [employer]   Right knee DJD    Seizures (HCC)    Sleep apnea    TIA (transient ischemic attack)    Type 2 diabetes mellitus (HCC)       ROS As per HPI.    Objective:     BP (!) 97/55   Pulse 63   Temp (!) 97.4 F (36.3 C) (Temporal)   Ht 5\' 4"  (1.626 m)   Wt 140 lb 2 oz (63.6 kg)   SpO2 98%   BMI 24.05 kg/m  Wt Readings from Last 3 Encounters:  01/27/23 140 lb 2 oz (63.6 kg)   01/21/23 140 lb (63.5 kg)  01/06/23 142 lb 4 oz (64.5 kg)      Physical Exam Vitals and nursing note reviewed.  Constitutional:      General: She is not in acute distress.    Appearance: Normal appearance. She is not ill-appearing, toxic-appearing or diaphoretic.  HENT:     Head: Normocephalic and atraumatic.     Right Ear: Tympanic membrane, ear canal and external ear normal.     Left Ear: Tympanic membrane, ear canal and external ear normal.     Nose: No congestion.     Mouth/Throat:     Lips: No lesions.     Mouth: Mucous membranes are moist. No lacerations, oral lesions or angioedema.     Tongue: No lesions. Tongue does not deviate from midline.     Palate: No mass and lesions.     Pharynx: Oropharynx is clear. No pharyngeal swelling, oropharyngeal exudate, posterior oropharyngeal erythema, uvula swelling or postnasal drip.     Tonsils: No tonsillar exudate or tonsillar abscesses.  Eyes:     Extraocular Movements: Extraocular movements intact.     Pupils: Pupils are equal, round, and reactive to light.  Neck:     Thyroid: No thyroid mass, thyromegaly or thyroid tenderness.  Cardiovascular:     Rate and Rhythm: Normal rate and regular rhythm.     Heart sounds: Murmur heard.     Systolic murmur is present with a grade of 2/6.  Pulmonary:     Effort: Pulmonary effort is normal. No respiratory distress.     Breath sounds: Normal breath sounds.  Abdominal:     General: Bowel sounds are normal. There is no distension.     Palpations: Abdomen is soft.     Tenderness: There is no abdominal tenderness. There is no guarding or rebound.  Musculoskeletal:     Cervical back: Neck supple.     Right lower leg: No edema.     Left lower leg: No edema.  Lymphadenopathy:     Cervical: No cervical adenopathy.  Skin:    General: Skin is warm and dry.  Neurological:     Mental Status: She is alert and oriented to person, place, and time. Mental status is at baseline.     Gait: Gait  (using cane) normal.  Psychiatric:        Attention and Perception: Attention normal.        Mood and Affect: Mood normal.  Speech: Speech normal.        Behavior: Behavior normal.        Cognition and Memory: Cognition is impaired. Memory is impaired.      No results found for any visits on 01/27/23.    The 10-year ASCVD risk score (Arnett DK, et al., 2019) is: 14.5%    Assessment & Plan:   Alyssia was seen today for medical management of chronic issues, diabetes, hyperlipidemia and hypertension.  Diagnoses and all orders for this visit:  Type 2 diabetes mellitus with hyperglycemia, without long-term current use of insulin (HCC) A1c 5.6 today, at goal of <7. Medication changes today: none. Continue current regimen. She is on an ACE/ARB. Declined statin. Eye exam: reminded to schedule. Foot exam: today. Urine micro: UTD. Diet and exercise.  -     Bayer DCA Hb A1c Waived -     Vitamin B12 -     Ambulatory referral to Podiatry  Discoloration and thickening of nails both feet Referral.  -     Ambulatory referral to Podiatry  Hypertension associated with type 2 diabetes mellitus (HCC) Well controlled on current regimen.  -     CBC with Differential/Platelet -     CMP14+EGFR  Hyperlipidemia associated with type 2 diabetes mellitus (HCC) Fasting lipid panel pending. Declined statin.  -     Lipid panel  Chronic HFrEF (heart failure with reduced ejection fraction) (HCC) Euvolemic on exam today. Established with oncology.   Depression, recurrent (HCC) Well controlled on current regimen.   Generalized anxiety disorder Benzodiazepine dependence (HCC) Controlled substance agreement signed Continue celexa- increased dosage 3 week ago. Discussed need to given dosage more time to work. PDMP reviewed, no red flags. CSA and UDS is UTD. Declined referral.  -     LORazepam (ATIVAN) 0.5 MG tablet; Take 1 tablet (0.5 mg total) by mouth 2 (two) times daily as needed for  anxiety.  Burning mouth syndrome Chronic throat pain Keep follow up with ENT. No thrush or cervical adenopathy on exam today.   Partial seizure (HCC) Well controlled on current regimen. Has upcoming appt with neurology.     Return in about 3 months (around 04/27/2023) for chronic follow up.   The patient indicates understanding of these issues and agrees with the plan.  Gabriel Earing, FNP

## 2023-01-27 NOTE — Telephone Encounter (Signed)
I sent a refill to her pharmacy. I had previously offered a neurology referral for evaluation of possible neuropathy causing her persistent pain but she has previously declined.  Please ask her if she is open to have this evaluation.  If so, please refer her to neurology for evaluation of dysgeusia. Thanks

## 2023-01-27 NOTE — Telephone Encounter (Signed)
Pt wants to know if she can get magic mouth wash refill to [facility]. She saw pcp today for burning mouth syndrome and states told to continue gabapentin tid. She states not helping and she see ENT on 12/30. She is also asking for her February appt to be moved up to see if she can get some relief with mouth pain.   [facility] pharmacy

## 2023-01-27 NOTE — Telephone Encounter (Signed)
Copied from CRM [id number]. Topic: Clinical - Medical Advice >> Jan 27, 2023  2:55 PM Donita Brooks wrote: Reason for CRM: pt is still having trouble with her mouth and like to speak with Dr. Lequita Halt nurse

## 2023-01-27 NOTE — Telephone Encounter (Signed)
Pt advised to continue Gabapentin TID for BMS as advised during visit and to keep appt with ENT and pt voiced understanding.

## 2023-01-27 NOTE — Patient Instructions (Signed)
Burning Mouth Syndrome Burning mouth syndrome (BMS), also known as burning tongue, is a condition where your tongue and roof of your mouth feel like they're burning. This condition often seems to start out of nowhere, and the pain can come and go. Treatment can help.  What is burning mouth syndrome? Burning mouth syndrome (BMS) is a burning sensation on your tongue, roof of your mouth or lips. It can happen anywhere in your mouth or throat. BMS often starts seemingly out of nowhere. It may feel like your tongue is being burned by a hot liquid like coffee.  People with BMS often report that the burning worsens throughout the day. Your mouth may feel OK when you wake up but develop a burning feeling later in the day. Once asleep, the pain may improve. The next day the cycle begins again.  A bitter or metallic taste often happens along with the burning feeling. Many people also feel a dry mouth despite having regular saliva flow. Sometimes, the burning is so severe that the chronic pain causes depression and anxiety.  Are there different types of burning mouth syndrome? There are two categories of burning mouth syndrome:  Primary BMS is when burning mouth isn't caused by an underlying condition. Secondary BMS is caused by an underlying condition, such as acid reflux. Treating the condition often cures burning mouth syndrome. Is burning mouth syndrome more common in certain people? Burning mouth is most common in people in postmenopause who are over 32 years old. This is because reduced estrogen levels cause decreased taste bud sensitivity.  Another factor that makes women and people assigned female at birth more likely to have BMS is their ability to taste. People have genetic differences in their tasting ability. You may be a:  Nontaster. Medium taster. Super taster, who experiences flavors more intensely compared with the other types. Women are more likely than men to be super tasters. Most  women with BMS are super tasters who've since lost some of their taste sensation. Studies show that many of those people also clench their teeth. The pressure on the teeth may increase the burning feeling.  Other people also develop BMS -- in those cases, they usually have another condition called geographic tongue. In this mild condition, red patches appear on the tongue's surface.  Symptoms and Causes What are the symptoms of burning mouth? Burning mouth syndrome symptoms include:  Pain in your mouth that feels like tingling, scalding or burning. Numbness in your mouth that comes and goes. Altered taste. Dry mouth. What is the main cause of primary burning mouth syndrome? Researchers believe the cause of primary BMS is nerve damage affecting the area of your tongue that controls taste and pain. There's a relationship between burning mouth and taste (gustatory) changes.  Many people with burning mouth have lost their bitter taste buds, which are located at the tongue's tip. Researchers believe that taste inhibits pain, but when people lose the ability to taste bitter flavors, pain fibers start to fire unexpectedly. People experience this pain as a burning sensation in their mouth.  What are the causes of secondary burning mouth syndrome? Medical conditions that can cause secondary BMS include:  Acid reflux. Allergies to metal dental products or certain foods. Depression. Dry mouth. Hormonal changes. Mouth infections. Nutritional deficiencies. Teeth grinding or jaw clenching. Occasionally, people with Sjgren's syndrome (which causes dry mouth and dry eyes), diabetes, thyroid disease and liver problems have burning mouth syndrome.  Are there certain medications that cause burning  mouth syndrome? Yes. Medications linked to BMS include ACE inhibitors for hypertension, some antidepressants and high blood pressure medications, such  as:  Captopril. Clonazepam. Efavirenz. Enalapril. Fluoxetine. Hormonal replacement therapies. Sertraline. What vitamin deficiency causes burning mouth syndrome? Sometimes, a deficiency of vitamin B12, folate or iron can mimic the sensation of burning mouth.  Is burning mouth syndrome contagious? No. Since the cause of primary BMS is nerve damage, you can't spread it to someone else.  Diagnosis and Tests How is burning mouth syndrome diagnosed? BMS is challenging to diagnose. Part of diagnosing BMS is ruling out other conditions that can cause similar symptoms, such as an oral yeast infection (thrush).  If you have symptoms, see your dentist first. Oral health problems cause one-third of all BMS cases. If needed, your dentist may refer you to a specialist.  To confirm a diagnosis, your healthcare provider may perform:  Allergy tests. Blood tests. Imaging tests. Oral swab tests. Salivary flow test. Tissue biopsy. Management and Treatment How can I relieve burning mouth syndrome? You may find that sucking on ice chips or chewing gum helps with the discomfort. Topical or systemic clonazepam, a prescription medication, may also relieve the pain.  How is burning mouth treated? For burning mouth syndrome treatment, some medications can help. The U[place] Food and Drug Administration (FDA) hasn't approved these drugs specifically for BMS, but your healthcare provider may prescribe them to help the symptoms. These medications include:  Some antidepressants. Antiseizure medication. Gabapentin (a medicine used for seizures and herpes pain). Your healthcare provider can help figure out which medications that may be most effective for you. If oral problems (like teeth grinding or jaw clenching) cause BMS, your dentist can help correct the issue. If an underlying condition causes BMS, treating that condition should help cure your burning mouth symptoms. You may need to switch medications to find  the best one for you.  Prevention How can I prevent burning mouth syndrome? There may not be a way to prevent BMS. But you can lessen the symptoms by avoiding anything that irritates your mouth, including:  Alcohol. High-acidic foods or drinks (like citrus juices). Hot and spicy foods or drinks. Mouthwash containing alcohol. Tobacco products. Also, make sure your diet contains enough vitamin B12, folate and iron.  Outlook / Prognosis What is the outlook for burning mouth syndrome? For some people, BMS goes away on its own after a few years. But that can be a long time to live with mouth pain. Seeking treatment from your healthcare provider can resolve the issue faster.  Living With How long does it take for burning mouth syndrome to go away? Without treatment, burning mouth syndrome can last for months or even years. About one-third of those with burning mouth syndrome will improve over three to five years without any treatment.  BMS treatment can provide relief within days or weeks. Talk to your healthcare provider about your specific treatment and when you can expect to feel better.  A note from [facility]  Burning mouth syndrome (BMS) is an uncomfortable condition that creates chronic pain. While it's sometimes difficult to diagnose, seeking care from your healthcare provider is the best way to cure the pain. Talk to your provider about your symptoms so you can find relief.

## 2023-01-28 LAB — LIPID PANEL
Cholesterol, Total: 218 mg/dL — ABNORMAL HIGH (ref 100–199)
HDL: 73 mg/dL (ref >39)
LDL CALC COMMENT:: 3 ratio (ref 0.0–4.4)
LDL Chol Calc (NIH): 103 mg/dL — ABNORMAL HIGH (ref 0–99)
Triglycerides: 250 mg/dL — ABNORMAL HIGH (ref 0–149)
VLDL Cholesterol Cal: 42 mg/dL — ABNORMAL HIGH (ref 5–40)

## 2023-01-28 LAB — CMP14+EGFR
ALT: 29 IU/L (ref 0–32)
AST: 34 IU/L (ref 0–40)
Albumin: 4.3 g/dL (ref 3.9–4.9)
Alkaline Phosphatase: 101 IU/L (ref 44–121)
BUN/Creatinine Ratio: 14 (ref 12–28)
BUN: 18 mg/dL (ref 8–27)
Bilirubin Total: 0.3 mg/dL (ref 0.0–1.2)
CO2: 25 mmol/L (ref 20–29)
Calcium: 9.2 mg/dL (ref 8.7–10.3)
Chloride: 100 mmol/L (ref 96–106)
Creatinine, Ser: 1.32 mg/dL — ABNORMAL HIGH (ref 0.57–1.00)
Globulin, Total: 2.8 g/dL (ref 1.5–4.5)
Glucose: 109 mg/dL — ABNORMAL HIGH (ref 70–99)
Potassium: 4.4 mmol/L (ref 3.5–5.2)
Sodium: 139 mmol/L (ref 134–144)
Total Protein: 7.1 g/dL (ref 6.0–8.5)
eGFR: 43 mL/min/{1.73_m2} — ABNORMAL LOW (ref >59)

## 2023-01-28 LAB — CBC WITH DIFFERENTIAL/PLATELET
Basophils Absolute: 0 10*3/uL (ref 0.0–0.2)
Basos: 0 %
EOS (ABSOLUTE): 0.1 10*3/uL (ref 0.0–0.4)
Eos: 1 %
Hematocrit: 39.4 % (ref 34.0–46.6)
Hemoglobin: 13.3 g/dL (ref 11.1–15.9)
Immature Grans (Abs): 0.1 10*3/uL (ref 0.0–0.1)
Immature Granulocytes: 1 %
Lymphocytes Absolute: 2.3 10*3/uL (ref 0.7–3.1)
Lymphs: 22 %
MCH: 29.4 pg (ref 26.6–33.0)
MCHC: 33.8 g/dL (ref 31.5–35.7)
MCV: 87 fL (ref 79–97)
Monocytes Absolute: 1.3 10*3/uL — ABNORMAL HIGH (ref 0.1–0.9)
Monocytes: 13 %
Neutrophils Absolute: 6.8 10*3/uL (ref 1.4–7.0)
Neutrophils: 63 %
Platelets: 298 10*3/uL (ref 150–450)
RBC: 4.53 x10E6/uL (ref 3.77–5.28)
RDW: 14.4 % (ref 11.7–15.4)
WBC: 10.5 10*3/uL (ref 3.4–10.8)

## 2023-01-28 LAB — VITAMIN B12: Vitamin B-12: 309 pg/mL (ref 232–1245)

## 2023-01-28 NOTE — Telephone Encounter (Signed)
noted 

## 2023-01-28 NOTE — Telephone Encounter (Signed)
Discussed with patient and she wants to wait til after she sees ENT on 12/30 and then if she wants referral she will call us back. She is aware med sent to pharmacy.

## 2023-01-28 NOTE — Telephone Encounter (Signed)
Left message to return call 

## 2023-01-30 ENCOUNTER — Telehealth: Payer: Self-pay | Admitting: Family Medicine

## 2023-01-30 NOTE — Telephone Encounter (Signed)
Will close this encounter as their is an open results note encounter.

## 2023-01-30 NOTE — Telephone Encounter (Signed)
Copied from CRM [id number]. Topic: Clinical - Lab/Test Results >> Jan 30, 2023  2:02 PM Gaetano Hawthorne wrote: Reason for CRM: Patient is calling Paula Compton back regarding her lab results - she was not available at the time when the patient called, Patient would appreciate a call before she leaves for the day.

## 2023-01-31 ENCOUNTER — Other Ambulatory Visit: Payer: Self-pay | Admitting: *Deleted

## 2023-01-31 DIAGNOSIS — R944 Abnormal results of kidney function studies: Secondary | ICD-10-CM

## 2023-02-03 ENCOUNTER — Ambulatory Visit: Payer: Medicare HMO | Attending: Cardiology | Admitting: *Deleted

## 2023-02-03 ENCOUNTER — Telehealth: Payer: Self-pay | Admitting: Family Medicine

## 2023-02-03 DIAGNOSIS — Z5181 Encounter for therapeutic drug level monitoring: Secondary | ICD-10-CM | POA: Diagnosis not present

## 2023-02-03 DIAGNOSIS — I4891 Unspecified atrial fibrillation: Secondary | ICD-10-CM

## 2023-02-03 LAB — POCT INR: INR: 2.3 (ref 2.0–3.0)

## 2023-02-03 NOTE — Patient Instructions (Signed)
Continue warfarin 1 tablet daily except 1/2 tablet Mondays, Wednesdays and Fridays Recheck in 6 wks

## 2023-02-03 NOTE — Telephone Encounter (Signed)
Copied from CRM [id number]. Topic: Clinical - Medical Advice >> Feb 03, 2023  8:06 AM Carol Richmond H wrote: Reason for CRM: Pt is requesting that [name] give her a call, states she has a few questions for her. Did not eleborate

## 2023-02-03 NOTE — Telephone Encounter (Signed)
Pt wanted to know why she was having to have repeat labs on Friday-advised it was to repeat her kidney function and pt voiced understanding.

## 2023-02-04 ENCOUNTER — Other Ambulatory Visit: Payer: Self-pay | Admitting: Cardiology

## 2023-02-05 ENCOUNTER — Encounter (INDEPENDENT_AMBULATORY_CARE_PROVIDER_SITE_OTHER): Payer: Self-pay | Admitting: *Deleted

## 2023-02-06 ENCOUNTER — Ambulatory Visit: Payer: Medicare HMO | Admitting: Physician Assistant

## 2023-02-06 ENCOUNTER — Ambulatory Visit: Payer: Medicare HMO

## 2023-02-07 ENCOUNTER — Other Ambulatory Visit: Payer: Medicare HMO

## 2023-02-07 ENCOUNTER — Other Ambulatory Visit: Payer: Self-pay | Admitting: *Deleted

## 2023-02-07 ENCOUNTER — Telehealth: Payer: Self-pay | Admitting: *Deleted

## 2023-02-07 DIAGNOSIS — H5213 Myopia, bilateral: Secondary | ICD-10-CM | POA: Diagnosis not present

## 2023-02-07 DIAGNOSIS — R944 Abnormal results of kidney function studies: Secondary | ICD-10-CM

## 2023-02-07 LAB — BMP8+EGFR
BUN/Creatinine Ratio: 13 (ref 12–28)
BUN: 16 mg/dL (ref 8–27)
CO2: 25 mmol/L (ref 20–29)
Calcium: 9.4 mg/dL (ref 8.7–10.3)
Chloride: 102 mmol/L (ref 96–106)
Creatinine, Ser: 1.26 mg/dL — ABNORMAL HIGH (ref 0.57–1.00)
Glucose: 133 mg/dL — ABNORMAL HIGH (ref 70–99)
Potassium: 4.5 mmol/L (ref 3.5–5.2)
Sodium: 141 mmol/L (ref 134–144)
eGFR: 46 mL/min/{1.73_m2} — ABNORMAL LOW (ref >59)

## 2023-02-07 MED ORDER — ENTRESTO 24-26 MG PO TABS: 1.0000 | ORAL_TABLET | Freq: Two times a day (BID) | ORAL | 3 refills | Status: DC

## 2023-02-07 NOTE — Telephone Encounter (Signed)
Patient dropped off 2025 [employer] PAF application for entresto. Entire patient portion of application and letters also left at office, neither was patient section of application completed or signed. Contacted and advised that application is incomplete and also above items were needed from her as well as the denial letter for [employer]. Request assistance filling out patient section and advised that I could assist her. Says she will come by the office on 02/10/2023 morning to sign application and get paperwork that will guide her to get a denial letter from the Extra Help Program.

## 2023-02-10 ENCOUNTER — Institutional Professional Consult (permissible substitution) (INDEPENDENT_AMBULATORY_CARE_PROVIDER_SITE_OTHER): Payer: Medicare HMO | Admitting: Otolaryngology

## 2023-02-10 ENCOUNTER — Ambulatory Visit (INDEPENDENT_AMBULATORY_CARE_PROVIDER_SITE_OTHER): Payer: Medicare HMO | Admitting: Gastroenterology

## 2023-02-10 VITALS — BP 105/66 | HR 71 | Temp 98.2°F | Ht 64.0 in | Wt 145.8 lb

## 2023-02-10 DIAGNOSIS — K7460 Unspecified cirrhosis of liver: Secondary | ICD-10-CM

## 2023-02-10 DIAGNOSIS — I8500 Esophageal varices without bleeding: Secondary | ICD-10-CM | POA: Diagnosis not present

## 2023-02-10 DIAGNOSIS — K146 Glossodynia: Secondary | ICD-10-CM | POA: Diagnosis not present

## 2023-02-10 NOTE — Patient Instructions (Signed)
-  continue lactulose at current dose - continue lasix 40mg  daily  -proceed with ENT appointment, please let us know if ENT does not feel they can manage your mouth pain and we can refer you to neurology as discussed  -repeat AFP tumor marker due feb 2025 - Reduce salt intake to <2 g per day - Can take Tylenol max of 2 g per day (650 mg q8h) for pain - Avoid NSAIDs for pain - Avoid eating raw oysters/shellfish - Ensure every night before going to sleep  Follow up 6 months

## 2023-02-10 NOTE — Progress Notes (Signed)
Referring Provider: Gabriel Earing, FNP Primary Care Physician:  Gabriel Earing, FNP Primary GI Physician: Dr. Levon Hedger   Chief Complaint  Patient presents with   burning on tongue    Follow up on mouth lesion. Has constant burning on tongue. States magic mouthwash helps for about 20 minutes. Sees ENT on 12/30.    HPI:   Carol Richmond is a 72 y.o. female with past medical history of NASH cirrhosis with G1 EV, GERD, SVT   Patient presenting today for follow up of burning mouth syndrome  Last seen August 2024, at that time reported her tongue is feeling better now, having intermittent flares of tongue pain and wanted a refill of Magic mouthwash.  Recommended checking CBC, MELD labs, AFP, schedule liver ultrasound for HCC screening, continue Magic mouthwash as needed, discussed possibility of switching metoprolol to carvedilol and get possible clearance from cards for EGD/Colonoscopy.  Present: She notes that she has intermittent burning to her tongue. She uses magic mouthwash PRN. She notes this is expensive so she uses it when it becomes very bad. She has appt with ENT as referred by her PCP for further evaluation of her burning. She denies any Issues with eating or swallowing. She has some pain on the right side of her throat at times though, occasionally has to clear her throat. She notes she has been told in the past she had a nerve issue causing some of her issues.   Denies swelling to her abdomen or legs. Having about 3 BMs per day. No nausea or vomiting. Denies episodes of confusion.  She reports she was told she could not have any further colonoscopy due to her overall health.   Cirrhosis related questions: Hematemesis/coffee ground emesis: No Abdominal pain: No Abdominal distention/worsening ascitesNo Fever/chills: No Number of Bms per day - 3-4 times, takes lactulose 3 times a day. Episodes of confusion/disorientation: No Taking diuretics?: yes, furosemide 40  mg qday History of variceal bleeding: No Prior history of banding?: No Prior episodes of SBP: No Last time liver imaging was performed: 09/2022 no lesions Last AFP:  09/2022 2.8 MELD score: 01/2023 18 Currently consuming alcohol: No Hepatitis A and B vaccination status: Immune to hepatitis A, states she was vaccinated for hepatitis B in 2023     Last Colonoscopy: 05/2017  normal colonoscopy, recommend repeat in 5 years Last Endoscopy:- 03/07/21 Normal hypopharynx. - Normal proximal esophagus and mid esophagus. - Grade I esophageal varices. - Z-line irregular, 37 cm from the incisors. - Portal hypertensive gastropathy. - Food was found in the stomach. - Erosive gastropathy with no stigmata of recent bleeding(secondary to NSAID; gastric biopsy negative for H. pylori in August 2020) - Normal duodenal bulb and second portion of the duodenum. - No specimens collected.   Past Medical History:  Diagnosis Date   Asthma    AVNRT (AV nodal re-entry tachycardia) (HCC)    Carotid artery disease (HCC)    Cirrhosis (HCC)    Diverticulosis of colon (without mention of hemorrhage)    Essential hypertension    Functional ovarian cysts    GERD (gastroesophageal reflux disease)    Hemorrhoids, external, without mention of complication    Hormone replacement therapy (postmenopausal)    IBS (irritable bowel syndrome)    Migraine    Mitral valve disease    Pacemaker    Complete heart block - [employer]   Right knee DJD    Seizures (HCC)    Sleep apnea    TIA (  transient ischemic attack)    Type 2 diabetes mellitus [facility]     Past Surgical History:  Procedure Laterality Date   BIOPSY  09/23/2018   Procedure: BIOPSY;  Surgeon: Malissa Hippo, MD;  Location: [facility];  Service: Endoscopy;;  duodeneum and stomach   CARDIOVERSION N/A 08/14/2022   Procedure: CARDIOVERSION;  Surgeon: [name], MD;  Location: [facility] INVASIVE CV LAB;  Service: Cardiovascular;  Laterality: N/A;   CHOLECYSTECTOMY   1980   ENDARTERECTOMY Right 07/01/2017   Procedure: ENDARTERECTOMY CAROTID RIGHT;  Surgeon: Nada Libman, MD;  Location: [facility] OR;  Service: Vascular;  Laterality: Right;   ESOPHAGOGASTRODUODENOSCOPY N/A 09/23/2018   Procedure: ESOPHAGOGASTRODUODENOSCOPY (EGD);  Surgeon: Malissa Hippo, MD;  Location: [facility];  Service: Endoscopy;  Laterality: N/A;  2:45-moved to 1:45 per Dewayne Hatch   ESOPHAGOGASTRODUODENOSCOPY (EGD) WITH PROPOFOL N/A 03/07/2021   Procedure: ESOPHAGOGASTRODUODENOSCOPY (EGD) WITH PROPOFOL;  Surgeon: Malissa Hippo, MD;  Location: [facility];  Service: Endoscopy;  Laterality: N/A;  12:50   LEFT HEART CATHETERIZATION WITH CORONARY ANGIOGRAM N/A 11/18/2013   Procedure: LEFT HEART CATHETERIZATION WITH CORONARY ANGIOGRAM;  Surgeon: Kathleene Hazel, MD;  Location: [facility] CATH LAB;  Service: Cardiovascular;  Laterality: N/A;   OOPHORECTOMY     2012 Dr. Donzetta Matters in [place]   PACEMAKER IMPLANT N/A 12/14/2021   Procedure: PACEMAKER IMPLANT;  Surgeon: Marinus Maw, MD;  Location: [facility] INVASIVE CV LAB;  Service: Cardiovascular;  Laterality: N/A;   PATCH ANGIOPLASTY Right 07/01/2017   Procedure: RIGHT CAROTID PATCH ANGIOPLASTY;  Surgeon: Nada Libman, MD;  Location: [facility] OR;  Service: Vascular;  Laterality: Right;   SVT ABLATION N/A 12/14/2021   Procedure: SVT ABLATION;  Surgeon: Marinus Maw, MD;  Location: [facility] INVASIVE CV LAB;  Service: Cardiovascular;  Laterality: N/A;   TEE WITHOUT CARDIOVERSION N/A 03/01/2022   Procedure: TRANSESOPHAGEAL ECHOCARDIOGRAM (TEE);  Surgeon: Thurmon Fair, MD;  Location: [facility] ENDOSCOPY;  Service: Cardiovascular;  Laterality: N/A;   TEE WITHOUT CARDIOVERSION N/A 08/14/2022   Procedure: TRANSESOPHAGEAL ECHOCARDIOGRAM;  Surgeon: [name], MD;  Location: [facility] INVASIVE CV LAB;  Service: Cardiovascular;  Laterality: N/A;   TONSILLECTOMY     TOTAL KNEE ARTHROPLASTY Right 09/02/2012   Procedure: TOTAL KNEE ARTHROPLASTY- right;  Surgeon: Loreta Ave, MD;   Location: [facility] OR;  Service: Orthopedics;  Laterality: Right;   TUBAL LIGATION     TYMPANOMASTOIDECTOMY Left 04/02/2016   Procedure: LEFT CANAL WALL DOWN TYMPANOMASTOIDECTOMY;  Surgeon: Newman Pies, MD;  Location: [facility];  Service: ENT;  Laterality: Left;   VAGINAL HYSTERECTOMY  1990   WOUND EXPLORATION Right 07/02/2017   Procedure: RIGHT NECK EXPLORATION WITH INTRAOPERATIVE ULTRASOUND;  Surgeon: Chuck Hint, MD;  Location: [facility] OR;  Service: Vascular;  Laterality: Right;    Current Outpatient Medications  Medication Sig Dispense Refill   Accu-Chek Softclix Lancets lancets check blood sugars twice daily DX E11.65 200 each 3   acetaminophen (TYLENOL) 500 MG tablet Take 500 mg by mouth daily as needed for mild pain.     amiodarone (PACERONE) 200 MG tablet take 1 tablet (200 MILLIGRAM total) by mouth daily. 90 tablet 1   Azelastine HCl 0.15 % SOLN      Blood Pressure Monitoring (OMRON 3 SERIES BP MONITOR) DEVI Use as directed 1 each 0   citalopram (CELEXA) 20 MG tablet Take 1 tablet (20 mg total) by mouth daily. 90 tablet 3   fluticasone (FLONASE) 50 MCG/ACT nasal spray Place 2  sprays into both nostrils daily. (Patient taking differently: Place 2 sprays into both nostrils daily as needed for allergies.) 16 g 6   furosemide (LASIX) 40 MG tablet take 1 tablet once daily. 30 tablet 2   gabapentin (NEURONTIN) 300 MG capsule Take 300 mg by mouth 3 (three) times daily as needed (neuropathy).     glucose blood (ACCU-CHEK GUIDE) test strip USE AS INSTRUCTED TO TEST BLOOD SUGAR DAILY AS DIRECTED. 50 each 11   lactulose (CHRONULAC) 10 GM/15ML solution take 30 mls (20 gram total) by mouth 3 times a day. 2700 mL 3   lamoTRIgine (LAMICTAL) 200 MG tablet take 1 tablet twice daily 180 tablet 3   [START ON 02/15/2023] LORazepam (ATIVAN) 0.5 MG tablet Take 1 tablet (0.5 mg total) by mouth 2 (two) times daily as needed for anxiety. 60 tablet 5   magic mouthwash (lidocaine, diphenhydrAMINE,  alum & mag hydroxide) suspension Swish and swallow 10 mLs 3 (three) times daily. 360 mL 0   metFORMIN (GLUCOPHAGE) 500 MG tablet Take 1 tablet (500 mg total) by mouth 2 (two) times daily with a meal. 120 tablet 3   metoprolol tartrate (LOPRESSOR) 25 MG tablet Take 0.5 tablets (12.5 mg total) by mouth 2 (two) times daily. 30 tablet 3   montelukast (SINGULAIR) 10 MG tablet Take 1 tablet (10 mg total) by mouth at bedtime. 30 tablet 3   ondansetron (ZOFRAN) 4 MG tablet take 1 tablet by mouth every 8 hours as needed for nausea and vomiting. 60 tablet 0   pantoprazole (PROTONIX) 40 MG tablet Take 1 tablet (40 mg total) by mouth daily. 90 tablet 1   potassium chloride SA (KLOR-CON M) 20 MEQ tablet take 2 tablets by mouth daily. 180 tablet 3   sacubitril-valsartan (ENTRESTO) 24-26 MG Take 1 tablet by mouth 2 (two) times daily. 180 tablet 3   traMADol (ULTRAM) 50 MG tablet Take 50 mg by mouth every 6 (six) hours as needed.     traZODone (DESYREL) 50 MG tablet Take 1 tablet (50 mg total) by mouth at bedtime as needed for sleep. 90 tablet 3   warfarin (COUMADIN) 5 MG tablet Take 1 tablet daily except 1/2 tablet on Mondays, Wednesdays and Fridays or as directed 30 tablet 5   nystatin (MYCOSTATIN) 100000 UNIT/ML suspension Take 5 mLs (500,000 Units total) by mouth 4 (four) times daily. Swish and swallow (Patient not taking: Reported on 02/10/2023) 60 mL 0   No current facility-administered medications for this visit.    Allergies as of 02/10/2023   (No Known Allergies)    Family History  Problem Relation Age of Onset   Heart disease Mother    Diabetes Mother    Heart disease Brother    Cancer Sister    Heart disease Brother    Heart disease Brother    Heart disease Son     Social History   Socioeconomic History   Marital status: Widowed    Spouse name: Not on file   Number of children: 3   Years of education: Not on file   Highest education level: Not on file  Occupational History    Occupation: retired  Tobacco Use   Smoking status: Never   Smokeless tobacco: Never  Vaping Use   Vaping status: Never Used  Substance and Sexual Activity   Alcohol use: No    Alcohol/week: 0.0 standard drinks of alcohol   Drug use: No   Sexual activity: Not Currently  Other Topics Concern   Not  on file  Social History Narrative   Lives alone. Granddaughter lives across the street, her 3 children all live nearby   Social Drivers of Health   Financial Resource Strain: Low Risk  (09/25/2022)   Overall Financial Resource Strain (CARDIA)    Difficulty of Paying Living Expenses: Not hard at all  Food Insecurity: No Food Insecurity (10/25/2022)   Hunger Vital Sign    Worried About Running Out of Food in the Last Year: Never true    Ran Out of Food in the Last Year: Never true  Transportation Needs: No Transportation Needs (10/25/2022)   PRAPARE - Administrator, Civil Service (Medical): No    Lack of Transportation (Non-Medical): No  Physical Activity: Insufficiently Active (09/25/2022)   Exercise Vital Sign    Days of Exercise per Week: 2 days    Minutes of Exercise per Session: 30 min  Stress: No Stress Concern Present (09/25/2022)   [employer] of Occupational Health - Occupational Stress Questionnaire    Feeling of Stress : Not at all  Social Connections: Socially Integrated (09/25/2022)   Social Connection and Isolation Panel [NHANES]    Frequency of Communication with Friends and Family: More than three times a week    Frequency of Social Gatherings with Friends and Family: More than three times a week    Attends Religious Services: More than 4 times per year    Active Member of [employer] or Organizations: Yes    Attends Engineer, structural: More than 4 times per year    Marital Status: Married    Review of systems General: negative for malaise, night sweats, fever, chills, weight loss Neck: Negative for lumps, goiter, pain and significant neck  swelling Resp: Negative for cough, wheezing, dyspnea at rest CV: Negative for chest pain, leg swelling, palpitations, orthopnea GI: denies melena, hematochezia, nausea, vomiting, diarrhea, constipation, dysphagia, odyonophagia, early satiety or unintentional weight loss. +burning of the mouth  MSK: Negative for joint pain or swelling, back pain, and muscle pain. Derm: Negative for itching or rash Psych: Denies depression, anxiety, memory loss, confusion. No homicidal or suicidal ideation.  Heme: Negative for prolonged bleeding, bruising easily, and swollen nodes. Endocrine: Negative for cold or heat intolerance, polyuria, polydipsia and goiter. Neuro: negative for tremor, gait imbalance, syncope and seizures. The remainder of the review of systems is noncontributory.  Physical Exam: BP 105/66 (BP Location: Left Arm, Patient Position: Sitting)   Pulse 71   Temp 98.2 F (36.8 C) (Oral)   Ht 5\' 4"  (1.626 m)   Wt 145 lb 12.8 oz (66.1 kg)   BMI 25.03 kg/m  General:   Alert and oriented. No distress noted. Pleasant and cooperative.  Head:  Normocephalic and atraumatic. Eyes:  Conjuctiva clear without scleral icterus. Mouth:  Oral mucosa pink and moist. Good dentition. No lesions. Heart: Normal rate and rhythm, s1 and s2 heart sounds present.  Lungs: Clear lung sounds in all lobes. Respirations equal and unlabored. Abdomen:  +BS, soft, non-tender and non-distended. No rebound or guarding. No HSM or masses noted. Derm: No palmar erythema or jaundice Msk:  Symmetrical without gross deformities. Normal posture. Extremities:  Without edema. Neurologic:  Alert and  oriented x4 Psych:  Alert and cooperative. Normal mood and affect.  Invalid input(s): "6 MONTHS"   ASSESSMENT: Carol Richmond is a 72 y.o. female presenting today for burning mouth syndrome  Patient with ongoing burning mouth discomfort, some relief with magic mouthwash. She was recommended previously to  see neurology for  further evaluation of this though she declined at that time.  She has follow-up with ENT upcoming for further evaluation as recommended by her PCP, will get again discussed neurology referral but will hold off on this pending evaluation by ENT.  Regards to her cirrhosis, she is not presenting with any issues with encephalopathy but has been maintained on lactulose chronically which she should continue.  Patient did have EV's on her most recent EGD and was supposed to have repeat EGD in January 2025 for surveillance and for CRC screening via colonoscopy for which Dr. Levon Hedger was going to discuss cardiac clearance with Dr. Diona Browner for, however at this time patient tells me she was advised not to have any further endoscopic procedures by cardiology, though appears per cardiology notes patient stated she did not wish to undergo any endoscopic procedures. Dr. Levon Hedger had also recommended discussion with cardiology of switching metoprolol to NSBB given her varices, though Cardiology recommended she remain on lopressor vs. Switching to Coreg due to her current BP.    PLAN:  -continue lactulose  - continue lasix 40mg  daily  -continue magic mouthwash PRN -proceed with ENT referral -consider neurology if ENT does not feel they can manage symptoms - repeat MELD labs June 2025 -repeat AFP/US for [facility] screening in feb 2025 - Reduce salt intake to <2 g per day - Can take Tylenol max of 2 g per day (650 mg q8h) for pain - Avoid NSAIDs for pain - Avoid eating raw oysters/shellfish - Ensure every night before going to sleep  All questions were answered, patient verbalized understanding and is in agreement with plan as outlined above.   Follow Up: 6 months   Navjot Pilgrim L. Jeanmarie Hubert, MSN, APRN, AGNP-C Adult-Gerontology Nurse Practitioner [facility]

## 2023-02-17 ENCOUNTER — Encounter (INDEPENDENT_AMBULATORY_CARE_PROVIDER_SITE_OTHER): Payer: Self-pay | Admitting: Otolaryngology

## 2023-02-17 ENCOUNTER — Institutional Professional Consult (permissible substitution) (INDEPENDENT_AMBULATORY_CARE_PROVIDER_SITE_OTHER): Payer: Medicare HMO | Admitting: Otolaryngology

## 2023-02-17 ENCOUNTER — Ambulatory Visit (INDEPENDENT_AMBULATORY_CARE_PROVIDER_SITE_OTHER): Payer: Medicare HMO | Admitting: Otolaryngology

## 2023-02-17 VITALS — BP 113/71 | HR 77 | Ht 64.0 in | Wt 141.0 lb

## 2023-02-17 DIAGNOSIS — K219 Gastro-esophageal reflux disease without esophagitis: Secondary | ICD-10-CM | POA: Diagnosis not present

## 2023-02-17 DIAGNOSIS — K146 Glossodynia: Secondary | ICD-10-CM | POA: Diagnosis not present

## 2023-02-17 DIAGNOSIS — K1379 Other lesions of oral mucosa: Secondary | ICD-10-CM

## 2023-02-17 MED ORDER — CAPSAICIN 0.025 % EX CREA: TOPICAL_CREAM | Freq: Two times a day (BID) | CUTANEOUS | 0 refills | Status: AC

## 2023-02-17 NOTE — Patient Instructions (Addendum)
BURNING MOUTH SYNDROME  1. Stress management helps more than any single other intervention. This could mean using a meditation app like Headspace, working a therapist who does Engineer, manufacturing systems Therapy, or finding a counselor through your primary care doctor or an online app like BetterHelp.  2. Capsaicin has been shown to help by "burning out" the pain receptors. This is a chemical that comes from the red (hot) pepper. It works by depleting substance P, the chemical that allows nerves to communicate pain/itch. In your condition, the nerves are overactive in the skin. Depleting their ability to signal will result in loss of ability to signal baseline itch/pain. Apply Capsaicin at prescribed doseage (0.025% for oral mucosa).  Please wear gloves or thoroughly wash hands after application.  If you do get this on another body area that is undesired, using olive or vegetable oil is best to dissolve this chemical but does not work instantaneously. It will burn at first, but that will get better with continued use.  You do need to stick to this treatment for it to be effective. Treat in the following manner. Capsaicin 4x daily to affected area x 1 week Capsaicin 3x daily to affected area x 1 week Capsaicin 2x daily to affected area x 2 weeks Capsaicin 1x daily to affected area until follow-up/as needed.  3. Vitamin B12 daily  4. Make sure your doctor has sent bloodwork to check for any issues with vitamin levels  5. Try taking alpha lipoic acid dose of 600-800 mg/day   Anecdotal treatments from patients: 1. Vitamin E oil (open the capsule and apply it directly to tongue). 2.  Lysine supplementation  Patient Information Sheet: Burning Mouth Syndrome (BMS)**  What is Burning Mouth Syndrome (BMS)? Burning Mouth Syndrome (BMS) is a condition characterized by a persistent, often painful, burning sensation in the mouth without an obvious cause. It can affect the tongue, lips, gums, palate, or  the entire mouth.  Symptoms of BMS: - Burning Sensation: Typically feels like scalding or tingling in the mouth, often worsening as the day progresses. - Dry Mouth: You may feel like your mouth is dry, even though saliva production is normal. - Altered Taste: Some patients experience a bitter or metallic taste in the mouth. - Numbness or Tingling: Occasionally, patients report sensations of numbness or tingling.  Who Gets BMS? BMS most commonly affects: - Women, especially postmenopausal women. - Adults over 46 years old.  Possible Causes: The exact cause of BMS is often unknown, but some contributing factors include: - Nerve Damage: Problems with nerves that control taste and pain. - Hormonal Changes: Especially in women going through menopause. - Nutritional Deficiencies: Low levels of iron, zinc, or vitamin B12. - Dry Mouth: From medications or conditions like Sjgren's syndrome. - Allergies or Reactions: To foods, dental materials, or oral care products. - Stress and Anxiety: Psychological factors may exacerbate symptoms.  #### **Diagnosis:** BMS is a diagnosis of exclusion, meaning it is diagnosed after other causes are ruled out. Your doctor or dentist may perform: - A physical examination. - Blood tests to check for nutritional deficiencies. - Allergy tests or saliva flow tests. - Oral swabs to rule out infections.  #### **Treatment Options:** While there is no definitive cure for BMS, the following treatments may help alleviate symptoms: 1. **Medications**:    - Pain relievers, such as over-the-counter analgesics or prescription medications like tricyclic antidepressants, benzodiazepines, or anticonvulsants.    - Oral rinses with anesthetic properties. 2. **Saliva Substitutes**: To relieve  dry mouth symptoms. 3. **Dietary Changes**: Adjusting your diet to avoid spicy, acidic, or irritating foods. 4. **Vitamin or Mineral Supplements**: If deficiencies are identified. 5.  **Cognitive Behavioral Therapy (CBT)**: For managing anxiety or stress that may worsen symptoms. 6. **Avoid Irritants**: Avoid alcohol, tobacco, and irritating dental products.  #### **Self-care Tips:** - **Stay Hydrated**: Drink plenty of water to keep your mouth moist. - **Avoid Triggers**: Spicy, acidic foods, and alcohol may exacerbate symptoms. - **Practice Good Oral Hygiene**: Use gentle, non-irritating oral care products. - **Stress Management**: Engage in relaxation techniques like meditation or yoga.  - Take Reflux Gourmet (natural supplement available on Amazon) to help with symptoms of chronic throat irritation     GamingLesson.nl - check out this website to learn more about reflux   -Avoid lying down for at least two hours after a meal or after drinking acidic beverages, like soda, or other caffeinated beverages. This can help to prevent stomach contents from flowing back into the esophagus. -Keep your head elevated while you sleep. Using an extra pillow or two can also help to prevent reflux. -Eat smaller and more frequent meals each day instead of a few large meals. This promotes digestion and can aid in preventing heartburn. -Wear loose-fitting clothes to ease pressure on the stomach, which can worsen heartburn and reflux. -Reduce excess weight around the midsection. This can ease pressure on the stomach. Such pressure can force some stomach contents back up the esophagus

## 2023-02-21 ENCOUNTER — Encounter (INDEPENDENT_AMBULATORY_CARE_PROVIDER_SITE_OTHER): Payer: Self-pay | Admitting: *Deleted

## 2023-02-21 ENCOUNTER — Other Ambulatory Visit (INDEPENDENT_AMBULATORY_CARE_PROVIDER_SITE_OTHER): Payer: Self-pay | Admitting: Gastroenterology

## 2023-02-21 DIAGNOSIS — K7682 Hepatic encephalopathy: Secondary | ICD-10-CM

## 2023-02-25 ENCOUNTER — Telehealth (INDEPENDENT_AMBULATORY_CARE_PROVIDER_SITE_OTHER): Payer: Self-pay | Admitting: *Deleted

## 2023-02-25 DIAGNOSIS — K7460 Unspecified cirrhosis of liver: Secondary | ICD-10-CM

## 2023-02-25 NOTE — Telephone Encounter (Signed)
 Patient called back because she received letter stating time for ultrasound and she said she could do any day except jan 21st or jan 27th.

## 2023-02-25 NOTE — Telephone Encounter (Signed)
 Spoke to patient. See other message from today

## 2023-02-25 NOTE — Telephone Encounter (Signed)
 Pt left vm for me to call her back at [phone]. Tried to call back and received voicemail and left message for her to call me back.

## 2023-02-26 NOTE — Telephone Encounter (Signed)
 [place] Abd limited RUQ last ultrasound ordered. Schedule same [place]? Please advise. Thank you

## 2023-02-27 NOTE — Telephone Encounter (Signed)
 US  ordered and scheduled for 03/10/23 at 10:30am; arrive at 10:15am. NPO after midnight. Pt contacted and states you aint got nothing earlier. Asked pt how early she would like to go and she states around 8am. [employer] and rescheduled pt for 9:30am and pt is to arrive at 9am. Pt verbalized understanding.

## 2023-02-28 ENCOUNTER — Ambulatory Visit: Payer: Medicare HMO

## 2023-02-28 ENCOUNTER — Ambulatory Visit: Payer: Medicare HMO | Admitting: Physician Assistant

## 2023-03-06 NOTE — Telephone Encounter (Signed)
Left message for patient that I would call to reschedule [place] appt. Contacted Central Scheduling but was on hold for a while. Will try to call back later

## 2023-03-06 NOTE — Telephone Encounter (Signed)
Pt left voicemail returning call. Returned call to patient. Advised pt of [place] appt. Pt states she has an appt on 2/17 with Dr.Taylor. advised pt I was sending her an appt letter that has Central Scheduling numbers on it so she can call to reschedule if need be. Pt verbalized understanding.

## 2023-03-06 NOTE — Telephone Encounter (Signed)
Pt left voicemail and states that she received her letter for her [place] on 03/10/23. Pt states that she dosent believe that would be 6 months. Last [place] was in August of 2024-is January 20 to soon for repeat US or do we need to reschedule for February. Please advise. Thank you!

## 2023-03-06 NOTE — Telephone Encounter (Signed)
Contacted Central Scheduling and rescheduled pt to 04/07/23 at 9:30am. NPO after midnight. Arrive at 9:15am. Left message for pt to return call

## 2023-03-06 NOTE — Telephone Encounter (Signed)
She is actually right, should be in February, please reschedule

## 2023-03-10 ENCOUNTER — Other Ambulatory Visit (HOSPITAL_COMMUNITY): Payer: Medicare HMO

## 2023-03-10 ENCOUNTER — Ambulatory Visit (HOSPITAL_COMMUNITY): Payer: Medicare HMO

## 2023-03-10 ENCOUNTER — Ambulatory Visit: Payer: Medicare HMO | Admitting: Physician Assistant

## 2023-03-10 ENCOUNTER — Ambulatory Visit: Payer: Medicare HMO

## 2023-03-11 ENCOUNTER — Ambulatory Visit: Payer: HMO | Attending: Nurse Practitioner | Admitting: Nurse Practitioner

## 2023-03-11 ENCOUNTER — Encounter: Payer: Self-pay | Admitting: Nurse Practitioner

## 2023-03-11 VITALS — BP 116/70 | HR 68 | Ht 64.0 in | Wt 141.0 lb

## 2023-03-11 DIAGNOSIS — Z8679 Personal history of other diseases of the circulatory system: Secondary | ICD-10-CM

## 2023-03-11 DIAGNOSIS — I059 Rheumatic mitral valve disease, unspecified: Secondary | ICD-10-CM

## 2023-03-11 DIAGNOSIS — I050 Rheumatic mitral stenosis: Secondary | ICD-10-CM | POA: Diagnosis not present

## 2023-03-11 DIAGNOSIS — Z9181 History of falling: Secondary | ICD-10-CM

## 2023-03-11 DIAGNOSIS — I4891 Unspecified atrial fibrillation: Secondary | ICD-10-CM

## 2023-03-11 DIAGNOSIS — Z950 Presence of cardiac pacemaker: Secondary | ICD-10-CM | POA: Diagnosis not present

## 2023-03-11 DIAGNOSIS — I340 Nonrheumatic mitral (valve) insufficiency: Secondary | ICD-10-CM

## 2023-03-11 DIAGNOSIS — I5022 Chronic systolic (congestive) heart failure: Secondary | ICD-10-CM | POA: Diagnosis not present

## 2023-03-11 NOTE — Progress Notes (Signed)
Cardiology Office Note:  .   Date:  03/11/2023 ID:  Carol Richmond, DOB 11-01-1950, MRN [id number] PCP: Gabriel Earing, FNP  [facility] Providers Cardiologist:  Nona Dell, MD Cardiology APP:  Sharlene Dory, NP  Electrophysiologist:  Lewayne Bunting, MD    History of Present Illness: Carol Richmond is a very pleasant 73 y.o. female with a PMH of rheumatic MV disease, moderate to severe mitral valve stenosis, HFmrEF, AVNRT, s/p RF ablation, OSA, CHB, s/p PPM, HTN, carotid artery disease, T2DM, hx of TIA, NASH cirrhosis, G1 EV, GERD, who presents today for follow-up.   Admitted 07/2022 for A-fib/A-flutter with RVR. Was initiated on Amiodarone, transferred to [facility]. Underwent TEE/DCCV on 08/14/2022. TEE revealed EF 25-30%, severely reduced RV systolic function, severe mitral valve regurgitation with severe mitral stenosis - see full report below.   Seen for follow-up on 10/09/2022 and chief concern was DOE noted for the last couple of weeks, was not bothersome per her report. Denied any chest pain, palpitations, syncope, presyncope, dizziness, orthopnea, PND, swelling or significant weight changes, acute bleeding, or claudication.  Tearful during interview at one point during conversation, admitted to recent stress.  Hospital admission 10/16/22 - 10/21/22 for AKI, UTI, and metabolic encephalopathy. Was noted to have confusion and falls on arrival to ED. CT of head unremarkable. Received tx for UTI. Hospital course noted by supra-therapeutic INR.   12/06/2022 - Today she presents for follow-up. She states she fell again a few weeks ago, denies any acute injuries.  Tearful during interview as she expresses fears about living alone.  Requesting to live with youngest daughter.  Denies any chest pain, shortness of breath, palpitations, syncope, presyncope, dizziness, orthopnea, PND, swelling or significant weight changes, acute bleeding, or claudication.  Expresses some frustration  with current primary care.  Says she will be going to see a neurologist soon.  Says she does not want to undergo colonoscopy/EGD as she has been told this will be high risk due to her heart condition.  She does have concerns regarding open heart surgery, and does not want to proceed with workup for this.   03/11/2023 - Presents today for follow-up.  She tells me she continues to have balance issues as well as falls.  She says most recently, she was preparing materials to help salt the pavement in front of her house due to this now, says she lost her balance and footing and fell and hit her head.  Denies any red flag signs or symptoms, does say she has a "knot" that is resolving along her forehead.  Says she did not go to the hospital after this fall. Denies any chest pain, shortness of breath, palpitations, syncope, presyncope, dizziness, orthopnea, PND, swelling or significant weight changes, acute bleeding, or claudication.  Studies Reviewed: .    Echo limited 11/2022: 1. Limited Echo for LVEF assessment.   2. Left ventricular ejection fraction, by estimation, is 45 to 50%. The left ventricle has mildly decreased function. The left ventricle  demonstrates regional wall motion abnormalities (see scoring  diagram/findings for description). Left ventricular  diastolic parameters are consistent with Grade I diastolic dysfunction  (impaired relaxation). Elevated left ventricular end-diastolic pressure.   3. Right ventricular systolic function is normal. The right ventricular  size is normal. Tricuspid regurgitation signal is inadequate for assessing  PA pressure.  Carotid duplex 09/2022: Summary:  Right Carotid: Velocities in the right ICA are consistent with a 1-39% stenosis.   Left Carotid: Velocities  in the left ICA are consistent with a 1-39%  stenosis.   Vertebrals: Bilateral vertebral arteries demonstrate antegrade flow.  Subclavians: Normal flow hemodynamics were seen in bilateral  subclavian arteries.  TEE 08/14/2022:  1. Left ventricular ejection fraction, by estimation, is 25 to 30%. The  left ventricle has severely decreased function. The left ventricle  demonstrates global hypokinesis.   2. Right ventricular systolic function is severely reduced. The right  ventricular size is mildly enlarged.   3. Left atrial size was moderately dilated. No left atrial/left atrial  appendage thrombus was detected.   4. The mitral valve is rheumatic. Severe mitral valve regurgitation.  Severe mitral stenosis.   5. The aortic valve is tricuspid. Aortic valve regurgitation is trivial.  Risk Assessment/Calculations:    CHA2DS2-VASc Score = 7  This indicates a 11.2% annual risk of stroke. The patient's score is based upon: CHF History: 1 HTN History: 1 Diabetes History: 1 Stroke History: 2 Vascular Disease History: 0 Age Score: 1 Gender Score: 1   The 10-year ASCVD risk score (Arnett DK, et al., 2019) is: 18.7%   Values used to calculate the score:     Age: 26 years     Sex: Female     Is Non-Hispanic African American: No     Diabetic: Yes     Tobacco smoker: No     Systolic Blood Pressure: 116 mmHg     Is BP treated: Yes     HDL Cholesterol: 73 mg/dL     Total Cholesterol: 218 mg/dL      Physical Exam:   VS:  BP 116/70   Pulse 68   Ht 5\' 4"  (1.626 m)   Wt 141 lb (64 kg)   SpO2 98%   BMI 24.20 kg/m    Wt Readings from Last 3 Encounters:  03/11/23 141 lb (64 kg)  02/17/23 141 lb (64 kg)  02/10/23 145 lb 12.8 oz (66.1 kg)    GEN: Well nourished, well developed in no acute distress NECK: No JVD; No carotid bruits CARDIAC: S1/S2, RRR, Grade 2/6 murmur, no rubs, no gallops RESPIRATORY:  Clear to auscultation without rales, wheezing or rhonchi  ABDOMEN: Soft, non-tender, non-distended EXTREMITIES:  No edema; No deformity, mild tremors noted along bilateral forearms  ASSESSMENT AND PLAN: .    HFmrEF Stage C, NYHA class I-II symptoms. EF mildly reduced at  45-50%. Euvolemic and well compensated on exam. Continue Entresto and Lopressor - cannot switch to Coreg at this time d/t current BP. Continue current GDMT. Low sodium diet, fluid restriction <2L, and daily weights encouraged. Educated to contact our office for weight gain of 2 lbs overnight or 5 lbs in one week.   A-fib/A-flutter, s/p DCCV, AVNRT, s/p ablation Underwent previous successful DCCV. Denies any recent palpitations or tachycardia. Continue Lopressor and Amiodarone. Past LFT and Thyroid panel results were WNL. Continue coumadin and continue to f/u with [facility]. Heart healthy diet and regular cardiovascular exercise encouraged.   Rheumatic mitral valve disease, severe mitral stenosis with severe regurgitation Past TEE revealed mitral valve was not amenable to valvuloplasty due to severity of mitral insufficiency and due to severe/asymmetrical focal calcification of mid anterior leaflet.  Severe MR and mitral valve stenosis noted on TEE 07/2022, with mean gradient at 9.5 mmHg. Continue current medication regimen.  ED precautions discussed. Previously discussed results with her during office visit today and discussed treatment options/plan of care. Pt declines surgery or intervention at this time.   CHB, s/p PPM Doing  well. Normal device function noted during last device check.  Continue follow-up with Dr. Ladona Ridgel.  5.  Hx of falls Admits to recent fall outside from poor balance. Care and ED precautions discussed and recommended ED evaluation for CT of head as she is on Coumadin. Denies any red flag signs/symptoms.  Continue to follow with PCP, may benefit from PT/OT.   Dispo: Follow-up with me or APP in 4-6 months or sooner if anything changes.   Signed, Sharlene Dory, NP

## 2023-03-11 NOTE — Patient Instructions (Addendum)
 Medication Instructions:  Your physician recommends that you continue on your current medications as directed. Please refer to the Current Medication list given to you today.  Labwork: None   Testing/Procedures: None   Follow-Up: Your physician recommends that you schedule a follow-up appointment in: 4-6 months   Any Other Special Instructions Will Be Listed Below (If Applicable).  If you need a refill on your cardiac medications before your next appointment, please call your pharmacy.

## 2023-03-12 ENCOUNTER — Telehealth: Payer: Self-pay

## 2023-03-12 NOTE — Telephone Encounter (Signed)
Pt aware.

## 2023-03-12 NOTE — Telephone Encounter (Signed)
Copied from CRM [id number]. Topic: Clinical - Medical Advice >> Mar 12, 2023 10:19 AM Shelah Lewandowsky wrote: Reason for CRM: Have pain in both ears, wants to know if she can just pick ear drops from pharmacy or if she needs to come in. Please call patient [phone]

## 2023-03-12 NOTE — Telephone Encounter (Signed)
Lmtcb and schedule appt in office for evaluation.

## 2023-03-13 ENCOUNTER — Encounter: Payer: Self-pay | Admitting: *Deleted

## 2023-03-13 NOTE — Progress Notes (Signed)
Fax notification received from [employer] that approved for entresto 24/26 mg BID 03/12/2023 through 02/18/2024.

## 2023-03-14 ENCOUNTER — Other Ambulatory Visit (INDEPENDENT_AMBULATORY_CARE_PROVIDER_SITE_OTHER): Payer: Self-pay | Admitting: Gastroenterology

## 2023-03-14 ENCOUNTER — Telehealth: Payer: Self-pay | Admitting: Internal Medicine

## 2023-03-14 ENCOUNTER — Other Ambulatory Visit (INDEPENDENT_AMBULATORY_CARE_PROVIDER_SITE_OTHER): Payer: Self-pay | Admitting: *Deleted

## 2023-03-14 ENCOUNTER — Telehealth (INDEPENDENT_AMBULATORY_CARE_PROVIDER_SITE_OTHER): Payer: Self-pay | Admitting: *Deleted

## 2023-03-14 DIAGNOSIS — R110 Nausea: Secondary | ICD-10-CM

## 2023-03-14 NOTE — Telephone Encounter (Signed)
Thanks, medication refilled.

## 2023-03-14 NOTE — Telephone Encounter (Signed)
Advised that she was approved for entresto 24/26 mg BID 03/12/2023 through 02/18/2024.  Advised to call [employer] and request a refill.  Verbalized understanding.

## 2023-03-14 NOTE — Telephone Encounter (Signed)
Patient calling for a refill of zofran to [facility]. Last ov 02/10/23.

## 2023-03-14 NOTE — Telephone Encounter (Signed)
Pt c/o medication issue:  1. Name of Medication:   sacubitril-valsartan (ENTRESTO) 24-26 MG   2. How are you currently taking this medication (dosage and times per day)?   As prescribed  3. Are you having a reaction (difficulty breathing--STAT)?   4. What is your medication issue?   Patient called to follow-up on getting assistance with this medication as her insurance will no longer cover this medication.

## 2023-03-14 NOTE — Telephone Encounter (Signed)
Patient called before this came in and I sent you a message. May be easier for you to respond this way to refill request. Last seen dec 2024.

## 2023-03-17 ENCOUNTER — Ambulatory Visit (INDEPENDENT_AMBULATORY_CARE_PROVIDER_SITE_OTHER): Payer: Medicare HMO

## 2023-03-17 ENCOUNTER — Encounter: Payer: Self-pay | Admitting: Family Medicine

## 2023-03-17 ENCOUNTER — Ambulatory Visit: Payer: HMO | Attending: Cardiology | Admitting: *Deleted

## 2023-03-17 ENCOUNTER — Ambulatory Visit (INDEPENDENT_AMBULATORY_CARE_PROVIDER_SITE_OTHER): Payer: Medicare HMO | Admitting: Family Medicine

## 2023-03-17 VITALS — BP 97/54 | HR 72 | Temp 97.5°F | Ht 64.0 in | Wt 144.4 lb

## 2023-03-17 DIAGNOSIS — E1159 Type 2 diabetes mellitus with other circulatory complications: Secondary | ICD-10-CM

## 2023-03-17 DIAGNOSIS — I4891 Unspecified atrial fibrillation: Secondary | ICD-10-CM

## 2023-03-17 DIAGNOSIS — Z5181 Encounter for therapeutic drug level monitoring: Secondary | ICD-10-CM | POA: Diagnosis not present

## 2023-03-17 DIAGNOSIS — I152 Hypertension secondary to endocrine disorders: Secondary | ICD-10-CM

## 2023-03-17 DIAGNOSIS — H6692 Otitis media, unspecified, left ear: Secondary | ICD-10-CM | POA: Diagnosis not present

## 2023-03-17 LAB — CUP PACEART REMOTE DEVICE CHECK
Battery Remaining Longevity: 162 mo
Battery Voltage: 3.07 V
Brady Statistic AP VP Percent: 0.16 %
Brady Statistic AP VS Percent: 50.19 %
Brady Statistic AS VP Percent: 0.01 %
Brady Statistic AS VS Percent: 49.64 %
Brady Statistic RA Percent Paced: 50.38 %
Brady Statistic RV Percent Paced: 0.17 %
Date Time Interrogation Session: 20250127011352
Implantable Lead Connection Status: 753985
Implantable Lead Connection Status: 753985
Implantable Lead Implant Date: 20231027
Implantable Lead Implant Date: 20231027
Implantable Lead Location: 753859
Implantable Lead Location: 753860
Implantable Lead Model: 3830
Implantable Lead Model: 5076
Implantable Pulse Generator Implant Date: 20231027
Lead Channel Impedance Value: 285 Ohm
Lead Channel Impedance Value: 361 Ohm
Lead Channel Impedance Value: 437 Ohm
Lead Channel Impedance Value: 570 Ohm
Lead Channel Pacing Threshold Amplitude: 0.625 V
Lead Channel Pacing Threshold Amplitude: 0.625 V
Lead Channel Pacing Threshold Pulse Width: 0.4 ms
Lead Channel Pacing Threshold Pulse Width: 0.4 ms
Lead Channel Sensing Intrinsic Amplitude: 12.75 mV
Lead Channel Sensing Intrinsic Amplitude: 12.75 mV
Lead Channel Sensing Intrinsic Amplitude: 2.5 mV
Lead Channel Sensing Intrinsic Amplitude: 2.5 mV
Lead Channel Setting Pacing Amplitude: 2 V
Lead Channel Setting Pacing Amplitude: 2.5 V
Lead Channel Setting Pacing Pulse Width: 0.4 ms
Lead Channel Setting Sensing Sensitivity: 1.2 mV
Zone Setting Status: 755011

## 2023-03-17 LAB — POCT INR: INR: 1.6 — AB (ref 2.0–3.0)

## 2023-03-17 MED ORDER — AMOXICILLIN-POT CLAVULANATE 875-125 MG PO TABS: 1.0000 | ORAL_TABLET | Freq: Two times a day (BID) | ORAL | 0 refills | Status: AC

## 2023-03-17 NOTE — Patient Instructions (Signed)
Take warfarin 1 1/2 tablets tonight then resume 1 tablet daily except 1/2 tablet Mondays, Wednesdays and Fridays Recheck in 3 wks

## 2023-03-17 NOTE — Progress Notes (Signed)
Acute Office Visit  Subjective:     [name], female    DOB: [birth year], 73 y.o.   MRN: [id number]  Chief Complaint  Patient presents with   Otalgia    Otalgia  There is pain in both (left x1 week, right for 2-3 days) ears. This is a new problem. The problem occurs constantly. The problem has been gradually worsening. There has been no fever. Associated symptoms include a sore throat. Pertinent negatives include no coughing, diarrhea, ear discharge or vomiting. Associated symptoms comments: Nasal congestion. Treatments tried: antihistamines.   Review of Systems  HENT:  Positive for ear pain and sore throat. Negative for ear discharge.   Respiratory:  Negative for cough.   Gastrointestinal:  Negative for diarrhea and vomiting.        Objective:    BP (!) 97/54   Pulse 72   Temp (!) 97.5 F (36.4 C) (Temporal)   Ht 5\' 4"  (1.626 m)   Wt 144 lb 6.4 oz (65.5 kg)   SpO2 98%   BMI 24.79 kg/m  Wt Readings from Last 3 Encounters:  03/17/23 144 lb 6.4 oz (65.5 kg)  03/11/23 141 lb (64 kg)  02/17/23 141 lb (64 kg)   BP Readings from Last 3 Encounters:  03/17/23 (!) 97/54  03/11/23 116/70  02/17/23 113/71     Physical Exam Vitals and nursing note reviewed.  Constitutional:      General: She is not in acute distress.    Appearance: She is not ill-appearing, toxic-appearing or diaphoretic.  HENT:     Right Ear: Ear canal and external ear normal. A middle ear effusion is present. Tympanic membrane is not erythematous, retracted or bulging.     Left Ear: Ear canal and external ear normal. A middle ear effusion is present. Tympanic membrane is bulging. Tympanic membrane is not erythematous or retracted.     Nose: Rhinorrhea present.     Mouth/Throat:     Mouth: Mucous membranes are moist.     Pharynx: Oropharynx is clear.  Eyes:     General:        Right eye: No discharge.        Left eye: No discharge.     Conjunctiva/sclera: Conjunctivae normal.   Cardiovascular:     Rate and Rhythm: Normal rate and regular rhythm.     Heart sounds: Normal heart sounds. No murmur heard. Pulmonary:     Effort: Pulmonary effort is normal. No respiratory distress.     Breath sounds: Normal breath sounds. No wheezing, rhonchi or rales.  Musculoskeletal:     Cervical back: Neck supple. No rigidity.     Right lower leg: No edema.     Left lower leg: No edema.  Lymphadenopathy:     Cervical: No cervical adenopathy.  Skin:    General: Skin is warm and dry.  Neurological:     General: No focal deficit present.     Mental Status: She is alert and oriented to person, place, and time.  Psychiatric:        Mood and Affect: Mood normal.        Behavior: Behavior normal.     Results for orders placed or performed in visit on 03/17/23  POCT INR  Result Value Ref Range   INR 1.6 (A) 2.0 - 3.0   POC INR          Assessment & Plan:   Carol Richmond was seen today for otalgia.  Diagnoses and all orders for this visit:  Acute left otitis media Augmentin as below. Discussed symptomatic care and return precautions.  -     amoxicillin-clavulanate (AUGMENTIN) 875-125 MG tablet; Take 1 tablet by mouth 2 (two) times daily for 7 days.  Hypertension associated with type 2 diabetes mellitus (HCC) BP a little soft today. Denies symptoms. Monitor at home and notify for high or low readings.   Return in about 6 weeks (around 04/28/2023) for chronic follow up.  Gabriel Earing, FNP

## 2023-03-19 ENCOUNTER — Ambulatory Visit: Payer: Medicare HMO | Admitting: Family Medicine

## 2023-03-19 ENCOUNTER — Telehealth (INDEPENDENT_AMBULATORY_CARE_PROVIDER_SITE_OTHER): Payer: Self-pay | Admitting: *Deleted

## 2023-03-19 ENCOUNTER — Telehealth: Payer: Self-pay | Admitting: *Deleted

## 2023-03-19 ENCOUNTER — Telehealth: Payer: Self-pay | Admitting: Family Medicine

## 2023-03-19 NOTE — Telephone Encounter (Signed)
Patient is calling to talk with Vashti Hey. Please call back

## 2023-03-19 NOTE — Telephone Encounter (Signed)
Spoke with patient.  She wanted to confirm she is suppose to be taking warfarin at night.  Told her that was correct.  No further questions.

## 2023-03-19 NOTE — Telephone Encounter (Signed)
Pa submitted through cover my meds for ondansetron 4mg . Insurance denied the request due to it not being a medically accepted indication. I used diagnosis of nausea and vomiting. Left patient a message to let her know she could pay out of pocket with a good rx card if she wanted to. She uses [facility] could send to chain pharmacy if she wanted to use good rx.

## 2023-03-19 NOTE — Telephone Encounter (Signed)
Pt aware of the paperwork process and fee. She will try to come Friday.  Copied from CRM [id number]. Topic: General - Other >> Mar 19, 2023  3:05 PM Antony Haste wrote: Reason for CRM: This patient is needing paperwork completed for a new handicap sticker by her PCP sometime this week If possible.

## 2023-03-21 ENCOUNTER — Telehealth: Payer: Self-pay | Admitting: Family Medicine

## 2023-03-21 ENCOUNTER — Telehealth (INDEPENDENT_AMBULATORY_CARE_PROVIDER_SITE_OTHER): Payer: Self-pay | Admitting: *Deleted

## 2023-03-21 ENCOUNTER — Other Ambulatory Visit (INDEPENDENT_AMBULATORY_CARE_PROVIDER_SITE_OTHER): Payer: Self-pay | Admitting: *Deleted

## 2023-03-21 DIAGNOSIS — K7460 Unspecified cirrhosis of liver: Secondary | ICD-10-CM

## 2023-03-21 NOTE — Telephone Encounter (Signed)
Aware handicap form is ready and there is a fee for the form.

## 2023-03-21 NOTE — Telephone Encounter (Signed)
Spoke with patient and she told me she paid cash for med. It was $10

## 2023-03-21 NOTE — Telephone Encounter (Signed)
Form printed and placed on PCP desk.

## 2023-03-21 NOTE — Telephone Encounter (Signed)
Copied from CRM [id number]. Topic: Medical Record Request - Records Request >> Mar 21, 2023  8:51 AM Ivette P wrote: Reason for CRM: Pt needs form filled out to get a new handicap sticker. Pt is asking if she can be reached out regarding her Handicap sticker form.    Pt callback [phone]

## 2023-03-21 NOTE — Telephone Encounter (Addendum)
Paitent in reminder file.----- Message from Nurse Maryclare Labrador sent at 02/10/2023  2:56 PM EST ----- Regarding: labs Repeat AFP tumor marker due feb 2025 per [facility]   I called patient and she has appt for [place] on 2/19 and told me she would pick up order from our office and [facility] that same day. Order placed up front.

## 2023-03-24 ENCOUNTER — Other Ambulatory Visit: Payer: Self-pay | Admitting: Cardiology

## 2023-03-24 ENCOUNTER — Other Ambulatory Visit: Payer: Self-pay | Admitting: Nurse Practitioner

## 2023-03-25 DIAGNOSIS — Z0279 Encounter for issue of other medical certificate: Secondary | ICD-10-CM

## 2023-03-27 ENCOUNTER — Other Ambulatory Visit: Payer: Self-pay | Admitting: Family Medicine

## 2023-03-27 ENCOUNTER — Telehealth (INDEPENDENT_AMBULATORY_CARE_PROVIDER_SITE_OTHER): Payer: Self-pay | Admitting: *Deleted

## 2023-03-27 NOTE — Telephone Encounter (Signed)
 When I called patient to get more information she told me she has had 3 good bowel movements this morning. She is not having any confusion, no forgetfulness, no swelling in legs. She was advised to call back if any changes.

## 2023-03-27 NOTE — Telephone Encounter (Signed)
 Patient called and concerned about her liver. States she is taking lactulose  30 mls tid and about 3 days ago she has a stool about 3 times per day and it has just been a small amount. She normally has at least 4 stools per day. She said 2 days ago she looked pregnant but that has went down and now her stomach is not tight but more flabby. She had a little pain yesterday in abdomen but states not bad and none today.   [phone]

## 2023-03-28 ENCOUNTER — Telehealth: Payer: Self-pay

## 2023-03-28 NOTE — Telephone Encounter (Signed)
 Copied from CRM [id number]. Topic: Clinical - Medical Advice >> Mar 28, 2023 10:54 AM Lennie Ra H wrote: Reason for CRM: no sore throat not sick, has something going with throat sometimes hurt to swallow

## 2023-04-03 ENCOUNTER — Ambulatory Visit (INDEPENDENT_AMBULATORY_CARE_PROVIDER_SITE_OTHER): Payer: Medicare HMO | Admitting: Gastroenterology

## 2023-04-03 ENCOUNTER — Ambulatory Visit: Payer: HMO | Attending: Cardiology | Admitting: *Deleted

## 2023-04-03 DIAGNOSIS — Z5181 Encounter for therapeutic drug level monitoring: Secondary | ICD-10-CM

## 2023-04-03 DIAGNOSIS — I4891 Unspecified atrial fibrillation: Secondary | ICD-10-CM | POA: Diagnosis not present

## 2023-04-03 LAB — POCT INR: INR: 1.8 — AB (ref 2.0–3.0)

## 2023-04-03 NOTE — Patient Instructions (Signed)
Increase warfarin to 1 tablet daily except 1/2 tablet on Tuesdays and Saturdays Recheck in 3 wks

## 2023-04-07 ENCOUNTER — Ambulatory Visit: Payer: HMO | Attending: Internal Medicine | Admitting: Internal Medicine

## 2023-04-07 ENCOUNTER — Encounter: Payer: Self-pay | Admitting: Internal Medicine

## 2023-04-07 ENCOUNTER — Other Ambulatory Visit (HOSPITAL_COMMUNITY): Payer: Self-pay

## 2023-04-07 VITALS — BP 112/62 | HR 60 | Ht 64.0 in | Wt 144.0 lb

## 2023-04-07 DIAGNOSIS — I4891 Unspecified atrial fibrillation: Secondary | ICD-10-CM | POA: Diagnosis not present

## 2023-04-07 LAB — CUP PACEART INCLINIC DEVICE CHECK
Brady Statistic RA Percent Paced: 23.4 %
Brady Statistic RV Percent Paced: 0.1 % — CL
Date Time Interrogation Session: 20250217134116
Implantable Lead Connection Status: 753985
Implantable Lead Connection Status: 753985
Implantable Lead Implant Date: 20231027
Implantable Lead Implant Date: 20231027
Implantable Lead Location: 753859
Implantable Lead Location: 753860
Implantable Lead Model: 3830
Implantable Lead Model: 5076
Implantable Pulse Generator Implant Date: 20231027

## 2023-04-07 MED ORDER — AMIODARONE HCL 200 MG PO TABS: 200.0000 mg | ORAL_TABLET | ORAL | 3 refills | Status: DC

## 2023-04-07 NOTE — Patient Instructions (Signed)
 Medication Instructions:   Take Amiodarone 200 mg Monday thru Friday, NONE on Saturday and Sunday   Labwork: None today  Testing/Procedures: None today  Follow-Up: 1 year  Any Other Special Instructions Will Be Listed Below (If Applicable).  If you need a refill on your cardiac medications before your next appointment, please call your pharmacy.

## 2023-04-07 NOTE — Progress Notes (Signed)
 HPI Carol Richmond returns for ongoing evaluation of SVT s/p ablation complicated by CHB. She is a pleasant 73 yo woman with a h/o fatty liver disease, HTN, and obesity who has developed a wide QRS tachycardia. She has baseline LBBB and her echo demonstrates that she has had a reduction in her EF from 55% to 35-40%. She has class 2 symptoms. She has not had syncope and she has only mild dyspnea.  During her ablation, she had CHB and underwent PPM insertion. In the interim she notes she feels well. She continues to have some intermittent tongue burning. She denies palpiations.  No Known Allergies   Current Outpatient Medications  Medication Sig Dispense Refill   Accu-Chek Softclix Lancets lancets check blood sugars twice daily DX E11.65 200 each 3   acetaminophen (TYLENOL) 500 MG tablet Take 500 mg by mouth daily as needed for mild pain.     amiodarone (PACERONE) 200 MG tablet take 1 tablet (200 MILLIGRAM total) by mouth daily. 90 tablet 1   Azelastine HCl 0.15 % SOLN      Blood Pressure Monitoring (OMRON 3 SERIES BP MONITOR) DEVI Use as directed 1 each 0   capsaicin (ZOSTRIX) 0.025 % cream Apply topically 2 (two) times daily. 60 g 0   citalopram (CELEXA) 20 MG tablet Take 1 tablet (20 mg total) by mouth daily. 90 tablet 3   fluticasone (FLONASE) 50 MCG/ACT nasal spray Place 2 sprays into both nostrils daily. (Patient taking differently: Place 2 sprays into both nostrils daily as needed for allergies.) 16 g 6   furosemide (LASIX) 40 MG tablet take 1 tablet once daily. 90 tablet 3   gabapentin (NEURONTIN) 300 MG capsule Take 300 mg by mouth 3 (three) times daily as needed (neuropathy).     glucose blood (ACCU-CHEK GUIDE) test strip USE AS INSTRUCTED TO TEST BLOOD SUGAR DAILY AS DIRECTED. 50 each 11   lactulose (CHRONULAC) 10 GM/15ML solution take 30 mls (20 gram total) by mouth 3 times a day. 2700 mL 3   lamoTRIgine (LAMICTAL) 200 MG tablet take 1 tablet twice daily 180 tablet 3    LORazepam (ATIVAN) 0.5 MG tablet Take 1 tablet (0.5 mg total) by mouth 2 (two) times daily as needed for anxiety. 60 tablet 5   metFORMIN (GLUCOPHAGE) 500 MG tablet Take 1 tablet (500 mg total) by mouth 2 (two) times daily with a meal. 120 tablet 3   metoprolol tartrate (LOPRESSOR) 25 MG tablet take 0.5 tablets (12.5 MILLIGRAM total) by mouth 2 (two) times daily. 90 tablet 1   montelukast (SINGULAIR) 10 MG tablet Take 1 tablet (10 mg total) by mouth at bedtime. 30 tablet 3   nystatin (MYCOSTATIN) 100000 UNIT/ML suspension Take 5 mLs (500,000 Units total) by mouth 4 (four) times daily. Swish and swallow 60 mL 0   ondansetron (ZOFRAN) 4 MG tablet take 1 tablet by mouth every 8 hours as needed for nausea and vomiting. 180 tablet 1   pantoprazole (PROTONIX) 40 MG tablet Take 1 tablet (40 mg total) by mouth daily. 90 tablet 1   potassium chloride SA (KLOR-CON M) 20 MEQ tablet take 2 tablets by mouth daily. 180 tablet 3   sacubitril-valsartan (ENTRESTO) 24-26 MG Take 1 tablet by mouth 2 (two) times daily. 180 tablet 3   traMADol (ULTRAM) 50 MG tablet Take 50 mg by mouth every 6 (six) hours as needed.     traZODone (DESYREL) 50 MG tablet Take 1 tablet (50 mg total) by mouth  at bedtime as needed for sleep. 90 tablet 3   warfarin (COUMADIN) 5 MG tablet Take 1 tablet daily except 1/2 tablet on Mondays, Wednesdays and Fridays or as directed 30 tablet 5   No current facility-administered medications for this visit.     Past Medical History:  Diagnosis Date   Asthma    AVNRT (AV nodal re-entry tachycardia) (HCC)    Carotid artery disease (HCC)    Cirrhosis (HCC)    Diverticulosis of colon (without mention of hemorrhage)    Essential hypertension    Functional ovarian cysts    GERD (gastroesophageal reflux disease)    Hemorrhoids, external, without mention of complication    Hormone replacement therapy (postmenopausal)    IBS (irritable bowel syndrome)    Migraine    Mitral valve disease     Pacemaker    Complete heart block - Medtronic   Right knee DJD    Seizures (HCC)    Sleep apnea    TIA (transient ischemic attack)    Type 2 diabetes mellitus (HCC)     ROS:   All systems reviewed and negative except as noted in the HPI.   Past Surgical History:  Procedure Laterality Date   BIOPSY  09/23/2018   Procedure: BIOPSY;  Surgeon: Malissa Hippo, MD;  [facility]: [facility];  Service: Endoscopy;;  duodeneum and stomach   CARDIOVERSION N/A 08/14/2022   Procedure: CARDIOVERSION;  Surgeon: [name], MD;  Location: [facility] INVASIVE CV LAB;  Service: Cardiovascular;  Laterality: N/A;   CHOLECYSTECTOMY  1980   ENDARTERECTOMY Right 07/01/2017   Procedure: ENDARTERECTOMY CAROTID RIGHT;  Surgeon: Nada Libman, MD;  Location: [facility] OR;  Service: Vascular;  Laterality: Right;   ESOPHAGOGASTRODUODENOSCOPY N/A 09/23/2018   Procedure: ESOPHAGOGASTRODUODENOSCOPY (EGD);  Surgeon: Malissa Hippo, MD;  [facility]: [facility];  Service: Endoscopy;  Laterality: N/A;  2:45-moved to 1:45 per Dewayne Hatch   ESOPHAGOGASTRODUODENOSCOPY (EGD) WITH PROPOFOL N/A 03/07/2021   Procedure: ESOPHAGOGASTRODUODENOSCOPY (EGD) WITH PROPOFOL;  Surgeon: Malissa Hippo, MD;  [facility]: [facility];  Service: Endoscopy;  Laterality: N/A;  12:50   LEFT HEART CATHETERIZATION WITH CORONARY ANGIOGRAM N/A 11/18/2013   Procedure: LEFT HEART CATHETERIZATION WITH CORONARY ANGIOGRAM;  Surgeon: Kathleene Hazel, MD;  Location: [facility] CATH LAB;  Service: Cardiovascular;  Laterality: N/A;   OOPHORECTOMY     2012 Dr. Donzetta Matters in [place]   PACEMAKER IMPLANT N/A 12/14/2021   Procedure: PACEMAKER IMPLANT;  Surgeon: Marinus Maw, MD;  Location: [facility] INVASIVE CV LAB;  Service: Cardiovascular;  Laterality: N/A;   PATCH ANGIOPLASTY Right 07/01/2017   Procedure: RIGHT CAROTID PATCH ANGIOPLASTY;  Surgeon: Nada Libman, MD;  Location: [facility] OR;  Service: Vascular;  Laterality: Right;   SVT ABLATION N/A 12/14/2021   Procedure: SVT ABLATION;   Surgeon: Marinus Maw, MD;  Location: [facility];  Service: Cardiovascular;  Laterality: N/A;   TEE WITHOUT CARDIOVERSION N/A 03/01/2022   Procedure: TRANSESOPHAGEAL ECHOCARDIOGRAM (TEE);  Surgeon: Thurmon Fair, MD;  Location: [facility] ENDOSCOPY;  Service: Cardiovascular;  Laterality: N/A;   TEE WITHOUT CARDIOVERSION N/A 08/14/2022   Procedure: TRANSESOPHAGEAL ECHOCARDIOGRAM;  Surgeon: [name], MD;  Location: [facility] INVASIVE CV LAB;  Service: Cardiovascular;  Laterality: N/A;   TONSILLECTOMY     TOTAL KNEE ARTHROPLASTY Right 09/02/2012   Procedure: TOTAL KNEE ARTHROPLASTY- right;  Surgeon: Loreta Ave, MD;  Location: [facility] OR;  Service: Orthopedics;  Laterality: Right;   TUBAL LIGATION     TYMPANOMASTOIDECTOMY Left  04/02/2016   Procedure: LEFT CANAL WALL DOWN TYMPANOMASTOIDECTOMY;  Surgeon: Newman Pies, MD;  Location: [facility];  Service: ENT;  Laterality: Left;   VAGINAL HYSTERECTOMY  1990   WOUND EXPLORATION Right 07/02/2017   Procedure: RIGHT NECK EXPLORATION WITH INTRAOPERATIVE ULTRASOUND;  Surgeon: Chuck Hint, MD;  Location: [facility] OR;  Service: Vascular;  Laterality: Right;     Family History  Problem Relation Age of Onset   Heart disease Mother    Diabetes Mother    Heart disease Brother    Cancer Sister    Heart disease Brother    Heart disease Brother    Heart disease Son      Social History   Socioeconomic History   Marital status: Widowed    Spouse name: Not on file   Number of children: 3   Years of education: Not on file   Highest education level: Not on file  Occupational History   Occupation: retired  Tobacco Use   Smoking status: Never   Smokeless tobacco: Never  Vaping Use   Vaping status: Never Used  Substance and Sexual Activity   Alcohol use: No    Alcohol/week: 0.0 standard drinks of alcohol   Drug use: No   Sexual activity: Not Currently  Other Topics Concern   Not on file  Social History Narrative   Lives alone.  Granddaughter lives across the street, her 3 children all live nearby   Social Drivers of Health   Financial Resource Strain: Low Risk  (09/25/2022)   Overall Financial Resource Strain (CARDIA)    Difficulty of Paying Living Expenses: Not hard at all  Food Insecurity: No Food Insecurity (10/25/2022)   Hunger Vital Sign    Worried About Running Out of Food in the Last Year: Never true    Ran Out of Food in the Last Year: Never true  Transportation Needs: No Transportation Needs (10/25/2022)   PRAPARE - Administrator, Civil Service (Medical): No    Lack of Transportation (Non-Medical): No  Physical Activity: Insufficiently Active (09/25/2022)   Exercise Vital Sign    Days of Exercise per Week: 2 days    Minutes of Exercise per Session: 30 min  Stress: No Stress Concern Present (09/25/2022)   [employer] of Occupational Health - Occupational Stress Questionnaire    Feeling of Stress : Not at all  Social Connections: Socially Integrated (09/25/2022)   Social Connection and Isolation Panel [NHANES]    Frequency of Communication with Friends and Family: More than three times a week    Frequency of Social Gatherings with Friends and Family: More than three times a week    Attends Religious Services: More than 4 times per year    Active Member of [employer] or Organizations: Yes    Attends Engineer, structural: More than 4 times per year    Marital Status: Married  Catering manager Violence: Not At Risk (10/17/2022)   Humiliation, Afraid, Rape, and Kick questionnaire    Fear of Current or Ex-Partner: No    Emotionally Abused: No    Physically Abused: No    Sexually Abused: No     Pulse 67   Ht 5\' 4"  (1.626 m)   Wt 144 lb (65.3 kg)   SpO2 97%   BMI 24.72 kg/m   Physical Exam:  Well appearing NAD HEENT: Unremarkable Neck:  No JVD, no thyromegally Lymphatics:  No adenopathy Back:  No CVA tenderness Lungs:  Clear with no  wheezes HEART:  Regular rate rhythm, no  murmurs, no rubs, no clicks Abd:  soft, positive bowel sounds, no organomegally, no rebound, no guarding Ext:  2 plus pulses, no edema, no cyanosis, no clubbing Skin:  No rashes no nodules Neuro:  CN II through XII intact, motor grossly intact  EKG - nsr with LBBB  DEVICE  Normal device function.  See PaceArt for details.   Assess/Plan:  SVT - she is s/p EP study and catheter ablation and has had no SVT CHB - she is conducting with LBBB and has not had any significant ventricular pacing PPM - her medtronic DDD PM is working normally.  PAF - she is maintaining NSR. I asked her to reduce her amio to 200 mg daily, none on Sat/Sun.    Carol Gowda Amaan Meyer,MD

## 2023-04-09 ENCOUNTER — Ambulatory Visit (HOSPITAL_COMMUNITY): Payer: HMO

## 2023-04-10 ENCOUNTER — Ambulatory Visit: Payer: Self-pay | Admitting: Family Medicine

## 2023-04-10 NOTE — Telephone Encounter (Signed)
 Answer Assessment - Initial Assessment Questions 1. REASON FOR CALL or QUESTION: "What is your reason for calling today?" or "How can I best help you?" or "What question do you have that I can help answer?"     Patient states she is not having a cough hardly at all but is still curious about what medication to take for coughing. Patient states she will get robitussin, but this RN unsure of patient needs at this time. Patient states she will be seeing Tiffany NP tomorrow for further evaluation. Advised patient to call back if we can help further.  Protocols used: Information Only Call - No Triage-A-AH

## 2023-04-11 ENCOUNTER — Ambulatory Visit: Payer: HMO | Admitting: Family Medicine

## 2023-04-14 ENCOUNTER — Other Ambulatory Visit: Payer: Self-pay | Admitting: Family Medicine

## 2023-04-14 DIAGNOSIS — E1165 Type 2 diabetes mellitus with hyperglycemia: Secondary | ICD-10-CM

## 2023-04-16 ENCOUNTER — Telehealth: Payer: Self-pay

## 2023-04-16 ENCOUNTER — Encounter: Payer: Self-pay | Admitting: Family Medicine

## 2023-04-16 ENCOUNTER — Ambulatory Visit (INDEPENDENT_AMBULATORY_CARE_PROVIDER_SITE_OTHER): Payer: HMO | Admitting: Family Medicine

## 2023-04-16 VITALS — BP 92/52 | HR 61 | Temp 97.6°F | Ht 64.0 in | Wt 141.2 lb

## 2023-04-16 DIAGNOSIS — J069 Acute upper respiratory infection, unspecified: Secondary | ICD-10-CM | POA: Diagnosis not present

## 2023-04-16 DIAGNOSIS — M26609 Unspecified temporomandibular joint disorder, unspecified side: Secondary | ICD-10-CM

## 2023-04-16 NOTE — Telephone Encounter (Signed)
 Pt aware to practice proper hand hygiene and use a mask when she is coughing and symptomatic but essentially just has the "common cold". Pt voiced understanding.

## 2023-04-16 NOTE — Telephone Encounter (Signed)
 Copied from CRM [id number]. Topic: Clinical - Medical Advice >> Apr 16, 2023  1:32 PM Carol Richmond wrote: Reason for CRM: Patient just diagnosed with URI and wants to know if she is contagious, [phone]

## 2023-04-16 NOTE — Progress Notes (Signed)
 Acute Office Visit  Subjective:     [name], female    DOB: [birth year], 73 y.o.   MRN: [id number]  Chief Complaint  Patient presents with   Cough    Cough This is a new problem. The current episode started in the past 7 days. The problem has been unchanged. The cough is Non-productive. Associated symptoms include ear pain, headaches (frontal) and nasal congestion. Pertinent negatives include no chest pain, fever, sore throat, shortness of breath or wheezing. Treatments tried: mucinex, cough syrup, cough drops. There is no history of asthma, bronchitis, COPD, emphysema or pneumonia.     Review of Systems  Constitutional:  Negative for fever.  HENT:  Positive for ear pain. Negative for sore throat.   Respiratory:  Positive for cough. Negative for shortness of breath and wheezing.   Cardiovascular:  Negative for chest pain.  Neurological:  Positive for headaches (frontal).        Objective:    BP (!) 92/52   Pulse 61   Temp 97.6 F (36.4 C) (Temporal)   Ht 5\' 4"  (1.626 m)   Wt 141 lb 3.2 oz (64 kg)   SpO2 97%   BMI 24.24 kg/m    Physical Exam Vitals and nursing note reviewed.  Constitutional:      General: She is not in acute distress.    Appearance: She is not ill-appearing, toxic-appearing or diaphoretic.  HENT:     Head:     Jaw: Pain on movement present. No trismus, swelling or malocclusion.     Comments: Mild crepitus of jaw bilaterally with opening.     Right Ear: Tympanic membrane, ear canal and external ear normal.     Left Ear: Tympanic membrane, ear canal and external ear normal.     Nose: Congestion present.     Mouth/Throat:     Mouth: Mucous membranes are moist.     Pharynx: Oropharynx is clear. No oropharyngeal exudate or posterior oropharyngeal erythema.     Tonsils: No tonsillar exudate or tonsillar abscesses.  Eyes:     General:        Right eye: No discharge.        Left eye: No discharge.     Conjunctiva/sclera:  Conjunctivae normal.  Cardiovascular:     Rate and Rhythm: Normal rate and regular rhythm.     Heart sounds: Normal heart sounds. No murmur heard. Pulmonary:     Effort: Pulmonary effort is normal. No respiratory distress.     Breath sounds: Normal breath sounds. No wheezing.  Musculoskeletal:     Cervical back: Neck supple. No rigidity.     Right lower leg: No edema.     Left lower leg: No edema.  Lymphadenopathy:     Cervical: No cervical adenopathy.  Skin:    General: Skin is warm and dry.  Neurological:     General: No focal deficit present.     Mental Status: She is alert and oriented to person, place, and time.  Psychiatric:        Mood and Affect: Mood normal.        Behavior: Behavior normal.     No results found for any visits on 04/16/23.      Assessment & Plan:   Carol Richmond was seen today for cough.  Diagnoses and all orders for this visit:  URI, acute Discussed symptomatic care and return precautions.   TMJ (temporomandibular joint disorder) Discussed that TMJ can cause ear pain.  Discussed avoiding clinching jaw, crunchy foods, crewing gum. Handout with ROM exercises given.   Return to office for new or worsening symptoms, or if symptoms persist.   The patient indicates understanding of these issues and agrees with the plan.   Gabriel Earing, FNP

## 2023-04-16 NOTE — Patient Instructions (Signed)
 Jaw Range of Motion Exercises Jaw range of motion exercises are exercises that help your jaw move better. Exercises that help you have good posture (postural exercises) also help relieve jaw discomfort. These are often done along with range of motion exercises. These exercises can help prevent or improve: Trouble opening your mouth. Pain in your jaw while it is open or closed. Temporomandibular joint (TMJ) pain. Headache caused by jaw tension. Take other actions to prevent or relieve jaw pain, such as: Avoiding things that cause or increase jaw pain. This may include: Chewing gum or eating hard foods. Clenching your jaw or teeth, grinding your teeth, or keeping tension in your jaw muscles. Opening your mouth wide, such as for a big yawn. Leaning on your jaw, such as resting your jaw in your hand while leaning on a desk. Putting ice on your jaw. To do this: Put ice in a plastic bag. Place a towel between your skin and the bag. Leave the ice on for 20 minutes, 2-3 times a day. Remove the ice if your skin turns bright red. This is very important. If you cannot feel pain, heat, or cold, you have a greater risk of damage to the area. Only do jaw exercises that your health care provider recommends. Only move your jaw as far as it can comfortably go in each direction. Do not move your jaw into positions that cause pain. Range of motion exercises Repeat each of these exercises 8 times, 1-2 times a day, or as told by your health care provider. Exercise A: Forward protrusion  Push your jaw forward. Hold this position for 1-2 seconds. Let your jaw return to its normal position and rest it there for 1-2 seconds. Exercise B: Controlled opening  Stand or sit in front of a mirror. Place your tongue on the roof of your mouth, just behind your top teeth. Keeping your tongue on the roof of your mouth, slowly open and close your mouth. While you open and close your mouth, watch your jaw in the mirror. Try  to keep your jaw from moving to one side or the other. Exercise C: Right and left motion  Move your jaw right. Hold this position for 1-2 seconds. Let your jaw return to its normal position, and rest it there for 1-2 seconds. Move your jaw left. Hold this position for 1-2 seconds. Let your jaw return to its normal position, and rest it there for 1-2 seconds. Postural exercises Exercise A: Chin tucks  You can do this exercise sitting, standing, or lying down. Move your head straight back, keeping your head level. You can guide the movement by placing your fingers on your chin to push your jaw back in an even motion. You should be able to feel a double chin form at the end of the motion. Hold this position for 5 seconds. Repeat 10-15 times. Exercise B: Shoulder blade squeeze  Sit or stand. Bend your elbows to about 90 degrees, which is the shape of a capital letter "L." Keep your upper arms by your body. Squeeze your shoulder blades down and back, as though you were trying to touch your elbows behind you. Do not shrug your shoulders or move your head. Hold this position for 5 seconds. Repeat 10-15 times. Exercise C: Chest stretch  Stand in a doorway with one of your feet slightly in front of the other. This is called a staggered stance. If you cannot reach your forearms to the door frame, do this exercise in  a corner of a room. Put both of your hands and your forearms on the door frame, with your arms wide apart. Make sure your arms are at a 90-degree angle to your body. Place your hands on the door frame at the height of your elbows. Slowly move your weight onto your front foot until you feel a stretch across your chest and in the front of your shoulders. Keep your head and chest upright and keep your abdominal muscles tight. Do not lean in. Hold this position for 30 seconds. Repeat 3 times. Contact a health care provider if: You have jaw pain that is new or gets worse. You have clicking or  popping sounds while doing the exercises. Get help right away if: Your jaw is stuck in one place and you cannot move it. You cannot open or close your mouth. Summary Jaw range of motion exercises are exercises that help your jaw move better. Take actions to prevent or relieve jaw pain: limit chewing gum or eating hard foods; clenching your jaw or teeth; or leaning on your jaw, such as resting your jaw in your hand while leaning on a desk. Repeat each of the jaw range of motion exercises 8 times, 1-2 times a day, or as told by your health care provider. Contact a health care provider if you have clicking or popping sounds while doing the exercises. This information is not intended to replace advice given to you by your health care provider. Make sure you discuss any questions you have with your health care provider. Document Revised: 09/17/2020 Document Reviewed: 09/17/2020 Elsevier Patient Education  2024 [employer].

## 2023-04-17 ENCOUNTER — Ambulatory Visit: Payer: Self-pay | Admitting: Family Medicine

## 2023-04-17 NOTE — Telephone Encounter (Signed)
 I spoke with pt and advised her of provider feedback and she voiced understanding and is ok with not getting antibiotic.

## 2023-04-17 NOTE — Telephone Encounter (Signed)
  Unable to triage. Pt was calling for antibiotic to be called in for Left ear pain. RN read NP note's from yesterday's visit  and emphasized TMJ can cause ear pain. "Well, I've done the exercises and they haven't helped.  I talked to the pharmacist and he said it sounds like I have an infection and need antibiotic." Pt is requesting Harlow Mares to call her back.           Copied from CRM [id number]. Topic: Clinical - Red Word Triage >> Apr 17, 2023  3:17 PM Carlatta H wrote: [facility] that prompted transfer to Nurse Triage: Patient is extreme pain in left ear//Was seen yesterday at the office//

## 2023-04-17 NOTE — Telephone Encounter (Signed)
 She doesn't need an abx. She did not have any findings to suggest an ear infection to warrant abx yesterday.

## 2023-04-22 ENCOUNTER — Telehealth (INDEPENDENT_AMBULATORY_CARE_PROVIDER_SITE_OTHER): Payer: Self-pay | Admitting: *Deleted

## 2023-04-22 NOTE — Telephone Encounter (Signed)
 Left message to return call to see what patient needed.

## 2023-04-22 NOTE — Telephone Encounter (Signed)
 Pt left vm asking for a call back   [phone]

## 2023-04-22 NOTE — Telephone Encounter (Signed)
 Pt called to let me know she still has not done labs at [facility] and she did not have appt for ultrasound. I told her she could go anytime next week to [facility] due to [facility] building [facility] not to go this week and I asked her if any day was good for the ultrasound and she said she would call back after she looks at her schedule. Carol Richmond I told her to just call back when ready to schedule.

## 2023-04-22 NOTE — Telephone Encounter (Signed)
 Pt has cancelled Ultrasound multiple times. When she calls back I will call Central Scheduling to get her scheduled.

## 2023-04-23 ENCOUNTER — Ambulatory Visit: Payer: HMO | Attending: Cardiology | Admitting: *Deleted

## 2023-04-23 DIAGNOSIS — I4891 Unspecified atrial fibrillation: Secondary | ICD-10-CM

## 2023-04-23 DIAGNOSIS — Z5181 Encounter for therapeutic drug level monitoring: Secondary | ICD-10-CM | POA: Diagnosis not present

## 2023-04-23 LAB — POCT INR: INR: 2.6 (ref 2.0–3.0)

## 2023-04-23 NOTE — Patient Instructions (Signed)
 Continue warfarin 1 tablet daily except 1/2 tablet on Tuesdays and Saturdays Recheck in 4 wks

## 2023-04-25 NOTE — Patient Instructions (Addendum)
 [facility] 3.428 [employer] reviews   Address: [address], [place], [place] [address] Hours:  Open ? Closes 4:30?PM Confirmed by this business 8 weeks ago Phone: [phone]   Our records indicate that you are due for your screening mammogram.  Please call the imaging center that does your yearly mammograms to make an appointment for a mammogram at your earliest convenience. Our office also has a mobile unit through the [facility] that comes to our location. Please call our office if you would like to make an appointment.

## 2023-04-25 NOTE — Progress Notes (Signed)
 Remote pacemaker transmission.

## 2023-04-28 ENCOUNTER — Encounter: Payer: Self-pay | Admitting: Family Medicine

## 2023-04-28 ENCOUNTER — Ambulatory Visit (INDEPENDENT_AMBULATORY_CARE_PROVIDER_SITE_OTHER): Payer: HMO | Admitting: Family Medicine

## 2023-04-28 VITALS — BP 94/58 | HR 61 | Temp 97.5°F | Ht 64.0 in | Wt 141.8 lb

## 2023-04-28 DIAGNOSIS — E785 Hyperlipidemia, unspecified: Secondary | ICD-10-CM

## 2023-04-28 DIAGNOSIS — F411 Generalized anxiety disorder: Secondary | ICD-10-CM

## 2023-04-28 DIAGNOSIS — I152 Hypertension secondary to endocrine disorders: Secondary | ICD-10-CM

## 2023-04-28 DIAGNOSIS — H348112 Central retinal vein occlusion, right eye, stable: Secondary | ICD-10-CM

## 2023-04-28 DIAGNOSIS — E1169 Type 2 diabetes mellitus with other specified complication: Secondary | ICD-10-CM

## 2023-04-28 DIAGNOSIS — K7460 Unspecified cirrhosis of liver: Secondary | ICD-10-CM | POA: Diagnosis not present

## 2023-04-28 DIAGNOSIS — E1159 Type 2 diabetes mellitus with other circulatory complications: Secondary | ICD-10-CM

## 2023-04-28 DIAGNOSIS — I8500 Esophageal varices without bleeding: Secondary | ICD-10-CM

## 2023-04-28 DIAGNOSIS — E1165 Type 2 diabetes mellitus with hyperglycemia: Secondary | ICD-10-CM

## 2023-04-28 DIAGNOSIS — R569 Unspecified convulsions: Secondary | ICD-10-CM

## 2023-04-28 DIAGNOSIS — I5022 Chronic systolic (congestive) heart failure: Secondary | ICD-10-CM

## 2023-04-28 DIAGNOSIS — Z79899 Other long term (current) drug therapy: Secondary | ICD-10-CM

## 2023-04-28 DIAGNOSIS — F339 Major depressive disorder, recurrent, unspecified: Secondary | ICD-10-CM

## 2023-04-28 DIAGNOSIS — F1320 Sedative, hypnotic or anxiolytic dependence, uncomplicated: Secondary | ICD-10-CM | POA: Diagnosis not present

## 2023-04-28 LAB — BAYER DCA HB A1C WAIVED: HB A1C (BAYER DCA - WAIVED): 5.2 % (ref 4.8–5.6)

## 2023-04-28 NOTE — Progress Notes (Signed)
 Established Patient Office Visit  Subjective   [name], female    DOB: 1951-02-16  Age: 73 y.o. MRN: [id number]  Chief Complaint  Patient presents with   Medical Management of Chronic Issues    HPI  DM Pt presents for follow up evaluation of Type 2 diabetes mellitus. Patient denies foot ulcerations, increased appetite, nausea, paresthesia of the feet, polydipsia, polyuria, visual disturbances, vomiting, and weight loss.  Current diabetic medications include: metformin 500 mg BID Compliant with meds - Yes  Current monitoring regimen: not checking  2. HTN/ A fib/ CHF Complaint with meds - Yes Current Medications -  amiodarone, lasix, metoprolol, entreso, coumadin Pertinent ROS:  Headache - No Fatigue - No Visual Disturbances - No Chest pain - No Dyspnea - No Palpitations - No LE edema - No  Followed closely by cardiology   3.  HLD She has declined medications. She has improved her diet and has lost some weight. She is walking some.   4. Seizure disorder Compliant with lamictal. She hasn't had any seizure activity. Did not schedule appt with neurology.   5. Anxiety/depression Compliant with celexa. Increased dosage 3 weeks ago. Continues to feel anxious all the time, worries about a lot of different things. Taking ativan BID prn. Stable symptoms. Denies SI. She has been more active socially lately with church. Feels lonely. She had declined referral for counseling numerous times.        04/28/2023   11:17 AM 04/16/2023   10:57 AM 03/17/2023    3:52 PM  Depression screen PHQ 2/9  Decreased Interest 0 0 0  Down, Depressed, Hopeless 0 1 0  PHQ - 2 Score 0 1 0  Altered sleeping 0 0 0  Tired, decreased energy 0 0 0  Change in appetite 0 0 0  Feeling bad or failure about yourself  0 0 0  Trouble concentrating 0 0 0  Moving slowly or fidgety/restless 0 0 0  Suicidal thoughts 0 0 0  PHQ-9 Score 0 1 0  Difficult doing work/chores Not difficult  at all Not difficult at all Not difficult at all      04/28/2023   11:18 AM 04/16/2023   10:58 AM 03/17/2023    3:52 PM 01/27/2023   10:50 AM  GAD 7 : Generalized Anxiety Score  Nervous, Anxious, on Edge 3 0 3 3  Control/stop worrying 0 0 0 0  Worry too much - different things 0 0 0 0  Trouble relaxing 0 0 0 0  Restless 0 0 0 0  Easily annoyed or irritable 0 0 0 0  Afraid - awful might happen 0 0 0 0  Total GAD 7 Score 3 0 3 3  Anxiety Difficulty Not difficult at all Not difficult at all Not difficult at all Not difficult at all      Past Medical History:  Diagnosis Date   Asthma    AVNRT (AV nodal re-entry tachycardia) (HCC)    Carotid artery disease (HCC)    Cirrhosis (HCC)    Diverticulosis of colon (without mention of hemorrhage)    Essential hypertension    Functional ovarian cysts    GERD (gastroesophageal reflux disease)    Hemorrhoids, external, without mention of complication    Hormone replacement therapy (postmenopausal)    IBS (irritable bowel syndrome)    Migraine    Mitral valve disease    Pacemaker    Complete heart block - Medtronic   Right knee DJD  Seizures (HCC)    Sleep apnea    TIA (transient ischemic attack)    Type 2 diabetes mellitus (HCC)       ROS As per HPI.    Objective:     BP (!) 94/58   Pulse 61   Temp (!) 97.5 F (36.4 C) (Temporal)   Ht 5\' 4"  (1.626 m)   Wt 141 lb 12.8 oz (64.3 kg)   SpO2 99%   BMI 24.34 kg/m  Wt Readings from Last 3 Encounters:  04/28/23 141 lb 12.8 oz (64.3 kg)  04/16/23 141 lb 3.2 oz (64 kg)  04/07/23 144 lb (65.3 kg)      Physical Exam Vitals and nursing note reviewed.  Constitutional:      General: She is not in acute distress.    Appearance: Normal appearance. She is not ill-appearing, toxic-appearing or diaphoretic.  HENT:     Mouth/Throat:     Lips: No lesions.     Mouth: No lacerations, oral lesions or angioedema.     Tongue: No lesions. Tongue does not deviate from midline.      Palate: No mass and lesions.     Pharynx: Oropharynx is clear. No pharyngeal swelling, uvula swelling or postnasal drip.     Tonsils: No tonsillar exudate or tonsillar abscesses.  Eyes:     General: No scleral icterus. Neck:     Thyroid: No thyroid mass, thyromegaly or thyroid tenderness.  Cardiovascular:     Rate and Rhythm: Normal rate and regular rhythm.     Heart sounds: Murmur heard.     Systolic murmur is present with a grade of 2/6.  Pulmonary:     Effort: Pulmonary effort is normal. No respiratory distress.     Breath sounds: Normal breath sounds.  Abdominal:     General: Bowel sounds are normal. There is no distension.     Palpations: Abdomen is soft.     Tenderness: There is no abdominal tenderness. There is no guarding or rebound.  Musculoskeletal:     Cervical back: Neck supple.     Right lower leg: No edema.     Left lower leg: No edema.  Lymphadenopathy:     Cervical: No cervical adenopathy.  Skin:    General: Skin is warm and dry.     Coloration: Skin is not jaundiced.  Neurological:     Mental Status: She is alert and oriented to person, place, and time. Mental status is at baseline.     Gait: Gait (using cane) normal.  Psychiatric:        Attention and Perception: Attention normal.        Mood and Affect: Mood normal.        Speech: Speech normal.        Behavior: Behavior normal.        Cognition and Memory: Cognition is impaired. Memory is impaired.      No results found for any visits on 04/28/23.    The 10-year ASCVD risk score (Arnett DK, et al., 2019) is: 12.7%    Assessment & Plan:   Carol Richmond was seen today for medical management of chronic issues.  Diagnoses and all orders for this visit:  Type 2 diabetes mellitus with hyperglycemia, without long-term current use of insulin (HCC) A1c 5.2. She would like to discontinue metformin. Agree with this. Will recheck A1c in 3 months and restart if not at goal. On ACE/ARB and statin.  -     CBC  with  Differential/Platelet -     CMP14+EGFR -     Bayer DCA Hb A1c Waived  Hypertension associated with type 2 diabetes mellitus (HCC) BP at goal.   Chronic HFrEF (heart failure with reduced ejection fraction) (HCC) Euvolemic on exam today.   Hyperlipidemia associated with type 2 diabetes mellitus (HCC) Last LDL at 103. Declined treatment.   Stable central retinal vein occlusion of right eye Discussed need for ophthalmology appt.   Esophageal varices without bleeding, unspecified esophageal varices type (HCC) Cirrhosis, nonalcoholic (HCC) Managed by GI. LFTs have been normal.   Depression, recurrent (HCC) Generalized anxiety disorder Well controlled on current regimen.   Benzodiazepine dependence (HCC) Controlled substance agreement signed Has refills on file. PDMP reviewed, no red flags.   Partial seizure (HCC) Contac info given for neurology again. Complaint with lamictal. Denies seizures.    Return in about 3 months (around 07/29/2023) for chronic follow up.   The patient indicates understanding of these issues and agrees with the plan.  Gabriel Earing, FNP

## 2023-04-29 LAB — CMP14+EGFR
ALT: 16 IU/L (ref 0–32)
AST: 30 IU/L (ref 0–40)
Albumin: 4.1 g/dL (ref 3.9–4.9)
Alkaline Phosphatase: 103 IU/L (ref 44–121)
BUN/Creatinine Ratio: 12 (ref 12–28)
BUN: 14 mg/dL (ref 8–27)
Bilirubin Total: 0.4 mg/dL (ref 0.0–1.2)
CO2: 26 mmol/L (ref 20–29)
Calcium: 9.3 mg/dL (ref 8.7–10.3)
Chloride: 101 mmol/L (ref 96–106)
Creatinine, Ser: 1.2 mg/dL — ABNORMAL HIGH (ref 0.57–1.00)
Globulin, Total: 2.6 g/dL (ref 1.5–4.5)
Glucose: 126 mg/dL — ABNORMAL HIGH (ref 70–99)
Potassium: 4.3 mmol/L (ref 3.5–5.2)
Sodium: 141 mmol/L (ref 134–144)
Total Protein: 6.7 g/dL (ref 6.0–8.5)
eGFR: 49 mL/min/{1.73_m2} — ABNORMAL LOW (ref >59)

## 2023-04-29 LAB — CBC WITH DIFFERENTIAL/PLATELET
Basophils Absolute: 0 10*3/uL (ref 0.0–0.2)
Basos: 0 %
EOS (ABSOLUTE): 0.1 10*3/uL (ref 0.0–0.4)
Eos: 1 %
Hematocrit: 36.7 % (ref 34.0–46.6)
Hemoglobin: 12.2 g/dL (ref 11.1–15.9)
Immature Grans (Abs): 0 10*3/uL (ref 0.0–0.1)
Immature Granulocytes: 0 %
Lymphocytes Absolute: 1.5 10*3/uL (ref 0.7–3.1)
Lymphs: 23 %
MCH: 29.6 pg (ref 26.6–33.0)
MCHC: 33.2 g/dL (ref 31.5–35.7)
MCV: 89 fL (ref 79–97)
Monocytes Absolute: 0.7 10*3/uL (ref 0.1–0.9)
Monocytes: 12 %
Neutrophils Absolute: 4 10*3/uL (ref 1.4–7.0)
Neutrophils: 64 %
Platelets: 184 10*3/uL (ref 150–450)
RBC: 4.12 x10E6/uL (ref 3.77–5.28)
RDW: 13.7 % (ref 11.7–15.4)
WBC: 6.3 10*3/uL (ref 3.4–10.8)

## 2023-04-30 ENCOUNTER — Telehealth: Payer: Self-pay | Admitting: Cardiology

## 2023-04-30 NOTE — Telephone Encounter (Signed)
 Patient stated she needs assistance with her Medtronic device.  Patient was give [employer] number but was not able to get assistance.

## 2023-05-02 ENCOUNTER — Telehealth: Payer: Self-pay | Admitting: Internal Medicine

## 2023-05-02 NOTE — Telephone Encounter (Signed)
 Pt states that her home device is not lighting up/not working??? Requesting cb

## 2023-05-02 NOTE — Telephone Encounter (Signed)
 The pt monitor was not plugged in. She plug the monitor in. I told her to keep an eye on it and if it give her problems on the weekend then to give me a call on Monday.

## 2023-05-02 NOTE — Telephone Encounter (Signed)
 I spoke with the pt but she was not at home. I gave her the device clinic direct number to call back when she get home.

## 2023-05-05 ENCOUNTER — Telehealth (INDEPENDENT_AMBULATORY_CARE_PROVIDER_SITE_OTHER): Payer: Self-pay

## 2023-05-05 NOTE — Telephone Encounter (Signed)
 Pt will need to call central scheduling at [phone] to reschedule [place]. Thank you!

## 2023-05-05 NOTE — Telephone Encounter (Signed)
 Patient called today to see if the AFP lab orders were still good that was ordered by [facility] on 03/21/2023. She says she still has the order from that date. I advised that yes this paper is still good to take it with her to have it drawn at [facility]. She also says she needed to reschedule her [place] that was due in January 2025, but she wanted to wait until she had her lab drawn at [facility]. She says she will have her labs drawn and will call back here to reschedule her [place].

## 2023-05-05 NOTE — Telephone Encounter (Signed)
 I spoke with the patient and left this message on her home number also as the patient was not currently at home and did not have a pen and paper to write with.   Pt will need to call central scheduling at [phone] to reschedule [place]. Thank you!  Patient states understanding.

## 2023-05-07 ENCOUNTER — Telehealth (INDEPENDENT_AMBULATORY_CARE_PROVIDER_SITE_OTHER): Payer: Self-pay | Admitting: Gastroenterology

## 2023-05-07 ENCOUNTER — Encounter: Payer: Self-pay | Admitting: Family Medicine

## 2023-05-07 ENCOUNTER — Ambulatory Visit (INDEPENDENT_AMBULATORY_CARE_PROVIDER_SITE_OTHER): Admitting: Family Medicine

## 2023-05-07 ENCOUNTER — Ambulatory Visit: Admitting: Family Medicine

## 2023-05-07 VITALS — BP 123/77 | HR 63 | Temp 98.2°F | Ht 64.0 in | Wt 142.8 lb

## 2023-05-07 DIAGNOSIS — K7460 Unspecified cirrhosis of liver: Secondary | ICD-10-CM | POA: Diagnosis not present

## 2023-05-07 DIAGNOSIS — M26609 Unspecified temporomandibular joint disorder, unspecified side: Secondary | ICD-10-CM

## 2023-05-07 NOTE — Patient Instructions (Signed)
 Temporomandibular Joint Syndrome  Temporomandibular joint syndrome (TMJ syndrome) is a condition that causes pain in the temporomandibular joints. These joints are located near your ears and allow your jaw to open and close. For people with TMJ syndrome, chewing, biting, or other movements of the jaw can be difficult or painful. TMJ syndrome is often mild and goes away within a few weeks. However, sometimes the condition becomes a long-term (chronic) problem. What are the causes? This condition may be caused by: Grinding your teeth or clenching your jaw. Some people do this when they are stressed. Arthritis. An injury to the jaw. A head or neck injury. Teeth or dentures that are not aligned well. In some cases, the cause of TMJ syndrome may not be known. What are the signs or symptoms? The most common symptom of this condition is aching pain on the side of the head in the area of the TMJ. Other symptoms may include: Pain when moving your jaw, such as when chewing or biting. Not being able to open your jaw all the way. Making a clicking sound when you open your mouth. Headache. Earache. Neck or shoulder pain. How is this diagnosed? This condition may be diagnosed based on: Your symptoms and medical history. A physical exam. Your health care provider may check the range of motion of your jaw. Imaging tests, such as X-rays or an MRI. You may also need to see your dentist, who will check if your teeth and jaw are lined up correctly. How is this treated? TMJ syndrome often goes away on its own. If treatment is needed, it may include: Eating soft foods and applying ice or heat. Medicines to relieve pain or inflammation. Medicines or massage to relax the muscles. A splint, bite plate, or mouthpiece to prevent teeth grinding or jaw clenching. Relaxation techniques or counseling to help reduce stress. A therapy for pain in which an electrical current is applied to the nerves through the skin  (transcutaneous electrical nerve stimulation). Acupuncture. This may help to relieve pain. Jaw surgery. This is rarely needed. Follow these instructions at home:  Eating and drinking Eat a soft diet if you are having trouble chewing. Avoid foods that require a lot of chewing. Do not chew gum. General instructions Take over-the-counter and prescription medicines only as told by your health care provider. If directed, put ice on the painful area. To do this: Put ice in a plastic bag. Place a towel between your skin and the bag. Leave the ice on for 20 minutes, 2-3 times a day. Remove the ice if your skin turns bright red. This is very important. If you cannot feel pain, heat, or cold, you have a greater risk of damage to the area. Apply a warm, wet cloth (warm compress) to the painful area as told. Massage your jaw area and do any jaw stretching exercises as told by your health care provider. If you were given a splint, bite plate, or mouthpiece, wear it as told by your health care provider. Keep all follow-up visits. This is important. Where to find more information [employer]: [employer] Contact a health care provider if: You have trouble eating. You have new or worsening symptoms. Get help right away if: Your jaw locks. Summary Temporomandibular joint syndrome (TMJ syndrome) is a condition that causes pain in the temporomandibular joints. These joints are located near your ears and allow your jaw to open and close. TMJ syndrome is often mild and goes away within  a few weeks. However, sometimes the condition becomes a long-term (chronic) problem. Symptoms include an aching pain on the side of the head in the area of the TMJ, pain when chewing or biting, and being unable to open your jaw all the way. You may also make a clicking sound when you open your mouth. TMJ syndrome often goes away on its own. If treatment is needed, it may  include medicines to relieve pain, reduce inflammation, or relax the muscles. A splint, bite plate, or mouthpiece may also be used to prevent teeth grinding or jaw clenching. This information is not intended to replace advice given to you by your health care provider. Make sure you discuss any questions you have with your health care provider. Document Revised: 09/17/2020 Document Reviewed: 09/17/2020 Elsevier Patient Education  2024 [employer].

## 2023-05-07 NOTE — Progress Notes (Signed)
   Acute Office Visit  Subjective:     [name], female    DOB: 04-Feb-1951, 73 y.o.   MRN: [id number]  Chief Complaint  Patient presents with   Otalgia    Otalgia  There is pain in the left ear. This is a new problem. The current episode started more than 1 month ago. The problem occurs constantly (intermittently). The problem has been waxing and waning. There has been no fever. Associated symptoms include hearing loss (chronic since childhood). Pertinent negatives include no coughing, ear discharge, sore throat or vomiting. Treatments tried: range of motion exercises for TMJ. The treatment provided mild relief.   Has been doing ROM exercises for TMJ. This has been somewhat helpful. Doesn't chew gum. She does eat hard candies. Wears dentures and takes these out at night.   Review of Systems  HENT:  Positive for ear pain and hearing loss (chronic since childhood). Negative for ear discharge and sore throat.   Respiratory:  Negative for cough.   Gastrointestinal:  Negative for vomiting.        Objective:    BP 123/77   Pulse 63   Temp 98.2 F (36.8 C) (Temporal)   Ht 5\' 4"  (1.626 m)   Wt 142 lb 12.8 oz (64.8 kg)   SpO2 97%   BMI 24.51 kg/m    Physical Exam Vitals and nursing note reviewed.  Constitutional:      General: She is not in acute distress.    Appearance: She is not ill-appearing, toxic-appearing or diaphoretic.  HENT:     Head:     Comments: Crepitus of left side of jaw.     Right Ear: Tympanic membrane, ear canal and external ear normal.     Left Ear: Tympanic membrane, ear canal and external ear normal.     Nose: Nose normal.     Mouth/Throat:     Mouth: Mucous membranes are moist.     Pharynx: Oropharynx is clear.  Eyes:     Extraocular Movements: Extraocular movements intact.     Conjunctiva/sclera: Conjunctivae normal.     Pupils: Pupils are equal, round, and reactive to light.  Cardiovascular:     Rate and Rhythm: Normal  rate and regular rhythm.     Heart sounds: Normal heart sounds. No murmur heard. Pulmonary:     Effort: Pulmonary effort is normal.     Breath sounds: Normal breath sounds.  Musculoskeletal:     Cervical back: Neck supple. No rigidity.  Lymphadenopathy:     Cervical: No cervical adenopathy.  Neurological:     Mental Status: She is alert and oriented to person, place, and time. Mental status is at baseline.  Psychiatric:        Mood and Affect: Mood normal.        Behavior: Behavior normal.     No results found for any visits on 05/07/23.      Assessment & Plan:   [name] was seen today for otalgia.  Diagnoses and all orders for this visit:  TMJ (temporomandibular joint disorder) Continue exercises. Discussed heat, ice. Avoid hard candies. Tylenol prn, limit to 3g total per day. Avoid NSAIDs due to cardiac hx. Follow up for new or worsening symptoms.   The patient indicates understanding of these issues and agrees with the plan.  Gabriel Earing, FNP

## 2023-05-07 NOTE — Telephone Encounter (Signed)
 Pt left voicemail on nurses voicemail to have [place] reschedule [place]. Pt has cancelled Ultrasound twice.  Contacted Central Scheduling and rescheduled patient to 05/16/23 at 10:30am. Pt to arrive at 10:15am. NPO after midnight. If this does not work for pt, pt will have to call Central Scheduling to reschedule. If pt cancels [place] appt, we will need to send note to provider. Left message to return call.

## 2023-05-08 LAB — AFP TUMOR MARKER: AFP-Tumor Marker: 2.5 ng/mL

## 2023-05-08 NOTE — Telephone Encounter (Signed)
 Pt left voicemail asking if we had rescheduled [place]. Returned call to pt and advised her of [place] appt.

## 2023-05-12 ENCOUNTER — Encounter (INDEPENDENT_AMBULATORY_CARE_PROVIDER_SITE_OTHER): Payer: Self-pay

## 2023-05-16 ENCOUNTER — Ambulatory Visit (HOSPITAL_COMMUNITY)
Admission: RE | Admit: 2023-05-16 | Discharge: 2023-05-16 | Disposition: A | Discharge status: Home / Self Care | Source: Ambulatory Visit | Attending: Gastroenterology | Admitting: Gastroenterology

## 2023-05-16 DIAGNOSIS — K7460 Unspecified cirrhosis of liver: Secondary | ICD-10-CM | POA: Insufficient documentation

## 2023-05-16 DIAGNOSIS — K7689 Other specified diseases of liver: Secondary | ICD-10-CM | POA: Diagnosis not present

## 2023-05-16 DIAGNOSIS — Z9049 Acquired absence of other specified parts of digestive tract: Secondary | ICD-10-CM | POA: Diagnosis not present

## 2023-05-19 ENCOUNTER — Telehealth (INDEPENDENT_AMBULATORY_CARE_PROVIDER_SITE_OTHER): Payer: Self-pay | Admitting: Gastroenterology

## 2023-05-19 DIAGNOSIS — M7918 Myalgia, other site: Secondary | ICD-10-CM | POA: Diagnosis not present

## 2023-05-19 DIAGNOSIS — M5030 Other cervical disc degeneration, unspecified cervical region: Secondary | ICD-10-CM | POA: Diagnosis not present

## 2023-05-19 DIAGNOSIS — M5412 Radiculopathy, cervical region: Secondary | ICD-10-CM | POA: Diagnosis not present

## 2023-05-19 NOTE — Telephone Encounter (Signed)
 Pt LMOM over the weekend that she was returning a call from Friday. Please call [phone] or [phone]

## 2023-05-26 ENCOUNTER — Telehealth: Payer: Self-pay | Admitting: Family Medicine

## 2023-05-27 ENCOUNTER — Ambulatory Visit: Attending: Cardiology | Admitting: *Deleted

## 2023-05-27 DIAGNOSIS — I4891 Unspecified atrial fibrillation: Secondary | ICD-10-CM | POA: Diagnosis not present

## 2023-05-27 DIAGNOSIS — Z5181 Encounter for therapeutic drug level monitoring: Secondary | ICD-10-CM

## 2023-05-27 LAB — POCT INR: INR: 2.8 (ref 2.0–3.0)

## 2023-05-27 NOTE — Patient Instructions (Signed)
 Continue warfarin 1 tablet daily except 1/2 tablet on Tuesdays and Saturdays Recheck in 4 wks

## 2023-06-02 ENCOUNTER — Encounter: Payer: Self-pay | Admitting: Family Medicine

## 2023-06-02 ENCOUNTER — Telehealth: Payer: Self-pay

## 2023-06-02 ENCOUNTER — Ambulatory Visit (INDEPENDENT_AMBULATORY_CARE_PROVIDER_SITE_OTHER): Admitting: Family Medicine

## 2023-06-02 VITALS — BP 111/64 | HR 63 | Temp 96.2°F | Ht 64.0 in | Wt 146.0 lb

## 2023-06-02 DIAGNOSIS — J0110 Acute frontal sinusitis, unspecified: Secondary | ICD-10-CM | POA: Diagnosis not present

## 2023-06-02 MED ORDER — AMOXICILLIN-POT CLAVULANATE 875-125 MG PO TABS: 1.0000 | ORAL_TABLET | Freq: Two times a day (BID) | ORAL | 0 refills | Status: AC

## 2023-06-02 NOTE — Telephone Encounter (Signed)
 Copied from CRM [id number]. Topic: General - Other >> Jun 02, 2023  9:30 AM Rosamond Comes wrote: Reason for CRM: patient calling in asking for diagnosis for ear. Mandy please call in patient with ear diagnosis  patient phone [phone]  ok to leave a message.

## 2023-06-02 NOTE — Progress Notes (Signed)
 Acute Office Visit  Subjective:     [name], female    DOB: [birth year], 73 y.o.   MRN: [id number]  Chief Complaint  Patient presents with   Ear Pain    Left ear     Otalgia  There is pain in the left ear. Episode onset: 7-10 days. The problem has been gradually worsening. There has been no fever. Associated symptoms include headaches (left frontal) and rhinorrhea. Pertinent negatives include no ear discharge, hearing loss, neck pain, sore throat or vomiting. She has tried nothing for the symptoms. The treatment provided no relief.     Review of Systems  HENT:  Positive for ear pain and rhinorrhea. Negative for ear discharge, hearing loss and sore throat.   Gastrointestinal:  Negative for vomiting.  Musculoskeletal:  Negative for neck pain.  Neurological:  Positive for headaches (left frontal).        Objective:    BP 111/64   Pulse 63   Temp (!) 96.2 F (35.7 C) (Temporal)   Ht 5\' 4"  (1.626 m)   Wt 146 lb (66.2 kg)   SpO2 98%   BMI 25.06 kg/m  Wt Readings from Last 3 Encounters:  06/02/23 146 lb (66.2 kg)  05/07/23 142 lb 12.8 oz (64.8 kg)  04/28/23 141 lb 12.8 oz (64.3 kg)      Physical Exam Vitals and nursing note reviewed.  Constitutional:      General: She is not in acute distress.    Appearance: She is not ill-appearing, toxic-appearing or diaphoretic.  HENT:     Right Ear: Tympanic membrane, ear canal and external ear normal.     Left Ear: Tympanic membrane, ear canal and external ear normal.     Nose: Rhinorrhea present.     Right Sinus: No maxillary sinus tenderness or frontal sinus tenderness.     Left Sinus: Maxillary sinus tenderness and frontal sinus tenderness present.     Mouth/Throat:     Mouth: Mucous membranes are moist.     Pharynx: Oropharynx is clear.     Tonsils: No tonsillar exudate or tonsillar abscesses. 1+ on the right. 1+ on the left.  Eyes:     General:        Right eye: No discharge.        Left eye:  No discharge.     Conjunctiva/sclera: Conjunctivae normal.  Cardiovascular:     Rate and Rhythm: Normal rate and regular rhythm.     Heart sounds: Normal heart sounds. No murmur heard. Pulmonary:     Effort: Pulmonary effort is normal.     Breath sounds: Normal breath sounds.  Musculoskeletal:     Cervical back: Neck supple. No rigidity.     Right lower leg: No edema.     Left lower leg: No edema.  Lymphadenopathy:     Cervical: No cervical adenopathy.  Skin:    General: Skin is warm and dry.  Neurological:     Mental Status: She is alert and oriented to person, place, and time. Mental status is at baseline.  Psychiatric:        Mood and Affect: Mood normal.        Behavior: Behavior normal.     No results found for any visits on 06/02/23.      Assessment & Plan:   [name] was seen today for ear pain.  Diagnoses and all orders for this visit:  Acute non-recurrent frontal sinusitis Augmentin as below. Discussed non  drowsy antihistamine OTC. Return to office for new or worsening symptoms, or if symptoms persist.  -     amoxicillin-clavulanate (AUGMENTIN) 875-125 MG tablet; Take 1 tablet by mouth 2 (two) times daily for 7 days.  Return if symptoms worsen or fail to improve.  Albertha Huger, FNP

## 2023-06-02 NOTE — Telephone Encounter (Signed)
 Called and advised patient that she was diagnosed with sinusitis and that was causing her ear pain. Patient verbalized understanding

## 2023-06-09 ENCOUNTER — Other Ambulatory Visit (INDEPENDENT_AMBULATORY_CARE_PROVIDER_SITE_OTHER): Payer: Self-pay | Admitting: Gastroenterology

## 2023-06-09 ENCOUNTER — Other Ambulatory Visit: Payer: Self-pay | Admitting: Cardiology

## 2023-06-09 NOTE — Telephone Encounter (Signed)
 Refill request for warfarin:  Last INR was 2.8 on 05/27/23 Next INR due 06/24/23 LOV was 04/07/23  Refill approved.

## 2023-06-16 ENCOUNTER — Ambulatory Visit (INDEPENDENT_AMBULATORY_CARE_PROVIDER_SITE_OTHER): Payer: Medicare HMO

## 2023-06-16 DIAGNOSIS — I442 Atrioventricular block, complete: Secondary | ICD-10-CM | POA: Diagnosis not present

## 2023-06-16 LAB — CUP PACEART REMOTE DEVICE CHECK
Battery Remaining Longevity: 156 mo
Battery Voltage: 3.06 V
Brady Statistic AP VP Percent: 1.59 %
Brady Statistic AP VS Percent: 56.13 %
Brady Statistic AS VP Percent: 0.03 %
Brady Statistic AS VS Percent: 42.25 %
Brady Statistic RA Percent Paced: 57.75 %
Brady Statistic RV Percent Paced: 1.62 %
Date Time Interrogation Session: 20250427222437
Implantable Lead Connection Status: 753985
Implantable Lead Connection Status: 753985
Implantable Lead Implant Date: 20231027
Implantable Lead Implant Date: 20231027
Implantable Lead Location: 753859
Implantable Lead Location: 753860
Implantable Lead Model: 3830
Implantable Lead Model: 5076
Implantable Pulse Generator Implant Date: 20231027
Lead Channel Impedance Value: 285 Ohm
Lead Channel Impedance Value: 342 Ohm
Lead Channel Impedance Value: 418 Ohm
Lead Channel Impedance Value: 551 Ohm
Lead Channel Pacing Threshold Amplitude: 0.625 V
Lead Channel Pacing Threshold Amplitude: 0.625 V
Lead Channel Pacing Threshold Pulse Width: 0.4 ms
Lead Channel Pacing Threshold Pulse Width: 0.4 ms
Lead Channel Sensing Intrinsic Amplitude: 13.75 mV
Lead Channel Sensing Intrinsic Amplitude: 13.75 mV
Lead Channel Sensing Intrinsic Amplitude: 2.25 mV
Lead Channel Sensing Intrinsic Amplitude: 2.25 mV
Lead Channel Setting Pacing Amplitude: 2 V
Lead Channel Setting Pacing Amplitude: 2.5 V
Lead Channel Setting Pacing Pulse Width: 0.4 ms
Lead Channel Setting Sensing Sensitivity: 1.2 mV
Zone Setting Status: 755011

## 2023-06-17 ENCOUNTER — Encounter: Payer: Self-pay | Admitting: Internal Medicine

## 2023-06-20 ENCOUNTER — Other Ambulatory Visit (INDEPENDENT_AMBULATORY_CARE_PROVIDER_SITE_OTHER): Payer: Self-pay | Admitting: Gastroenterology

## 2023-06-23 ENCOUNTER — Telehealth: Payer: Self-pay

## 2023-06-23 NOTE — Telephone Encounter (Signed)
 Copied from CRM [id number]. Topic: Clinical - Medical Advice >> Jun 23, 2023  8:32 AM Tisa Forester wrote: Reason for CRM: patient requesting for Lugenia Said or nurse need advice  patient haven worsen left Ear pain it comes and goes  patient call back number [phone] (M) [phone] (H)

## 2023-06-23 NOTE — Telephone Encounter (Signed)
 Returned patient call, scheduled appointment with DOD on 05/06

## 2023-06-24 ENCOUNTER — Encounter: Payer: Self-pay | Admitting: Family Medicine

## 2023-06-24 ENCOUNTER — Ambulatory Visit: Admitting: Family Medicine

## 2023-06-24 ENCOUNTER — Ambulatory Visit: Attending: Cardiology | Admitting: *Deleted

## 2023-06-24 VITALS — BP 102/62 | HR 58 | Temp 98.3°F | Ht 64.0 in | Wt 146.0 lb

## 2023-06-24 DIAGNOSIS — I4891 Unspecified atrial fibrillation: Secondary | ICD-10-CM | POA: Diagnosis not present

## 2023-06-24 DIAGNOSIS — H6592 Unspecified nonsuppurative otitis media, left ear: Secondary | ICD-10-CM

## 2023-06-24 DIAGNOSIS — H6992 Unspecified Eustachian tube disorder, left ear: Secondary | ICD-10-CM | POA: Diagnosis not present

## 2023-06-24 DIAGNOSIS — J301 Allergic rhinitis due to pollen: Secondary | ICD-10-CM

## 2023-06-24 DIAGNOSIS — Z5181 Encounter for therapeutic drug level monitoring: Secondary | ICD-10-CM | POA: Diagnosis not present

## 2023-06-24 LAB — POCT INR: INR: 2.5 (ref 2.0–3.0)

## 2023-06-24 MED ORDER — CEFPROZIL 500 MG PO TABS: 500.0000 mg | ORAL_TABLET | Freq: Two times a day (BID) | ORAL | 0 refills | Status: DC

## 2023-06-24 MED ORDER — BETAMETHASONE SOD PHOS & ACET 6 (3-3) MG/ML IJ SUSP
6.0000 mg | Freq: Once | INTRAMUSCULAR | Status: AC
  Administered 2023-06-24: 6 mg via INTRAMUSCULAR

## 2023-06-24 MED ORDER — FLUTICASONE PROPIONATE 50 MCG/ACT NA SUSP: 2.0000 | Freq: Two times a day (BID) | NASAL | Status: DC

## 2023-06-24 MED ORDER — FEXOFENADINE HCL 180 MG PO TABS: 180.0000 mg | ORAL_TABLET | Freq: Every day | ORAL | 11 refills | Status: DC

## 2023-06-24 NOTE — Patient Instructions (Signed)
 Continue warfarin 1 tablet daily except 1/2 tablet on Tuesdays and Saturdays Recheck in 5 wks

## 2023-06-24 NOTE — Progress Notes (Signed)
 Subjective:  [name], female    DOB: 07/29/1950  Age: 73 y.o. MRN: [id number]  CC: Ear Pain (Left ear pain on and off for one week. Can't hear out of that ear anyways so hearing not effected. No drainage. )   HPI Carol Richmond presents for pain in left ear. (Has left sided deafness. ) Onset about a week ago. Has chronic runny nose.   Patient has allergic rhinitis symptoms including sneezing frequently sniffling, clear rhinorrhea, watery and itchy eyes. There has been no fever no chills no sweats. No earaches. There is some scratchy throat but no sore throat or difficulty swallowing. There is some nasal congestion.      06/24/2023   10:41 AM 06/02/2023    7:58 AM 05/07/2023   11:27 AM  Depression screen PHQ 2/9  Decreased Interest 0 0 0  Down, Depressed, Hopeless 0 0 0  PHQ - 2 Score 0 0 0  Altered sleeping 0  0  Tired, decreased energy 0  0  Change in appetite 0  0  Feeling bad or failure about yourself  0  0  Trouble concentrating 0  0  Moving slowly or fidgety/restless 0  0  Suicidal thoughts 0  0  PHQ-9 Score 0  0  Difficult doing work/chores Not difficult at all  Not difficult at all    History Carol Richmond has a past medical history of Asthma, AVNRT (AV nodal re-entry tachycardia) (HCC), Carotid artery disease (HCC), Cirrhosis (HCC), Diverticulosis of colon (without mention of hemorrhage), Essential hypertension, Functional ovarian cysts, GERD (gastroesophageal reflux disease), Hemorrhoids, external, without mention of complication, Hormone replacement therapy (postmenopausal), IBS (irritable bowel syndrome), Migraine, Mitral valve disease, Pacemaker, Right knee DJD, Seizures (HCC), Sleep apnea, TIA (transient ischemic attack), and Type 2 diabetes mellitus (HCC).   Carol Richmond has a past surgical history that includes Cholecystectomy (1980); Vaginal hysterectomy (1990); Tonsillectomy; Oophorectomy; Tubal ligation; Total knee arthroplasty (Right, 09/02/2012); left  heart catheterization with coronary angiogram (N/A, 11/18/2013); tympanomastoidectomy (Left, 04/02/2016); Endarterectomy (Right, 07/01/2017); Patch angioplasty (Right, 07/01/2017); Wound exploration (Right, 07/02/2017); Esophagogastroduodenoscopy (N/A, 09/23/2018); biopsy (09/23/2018); Esophagogastroduodenoscopy (egd) with propofol  (N/A, 03/07/2021); SVT ABLATION (N/A, 12/14/2021); PACEMAKER IMPLANT (N/A, 12/14/2021); TEE without cardioversion (N/A, 03/01/2022); TEE without cardioversion (N/A, 08/14/2022); and Cardioversion (N/A, 08/14/2022).   Her family history includes Cancer in her sister; Diabetes in her mother; Heart disease in her brother, brother, brother, mother, and son.Carol Richmond reports that Carol Richmond has never smoked. Carol Richmond has never used smokeless tobacco. Carol Richmond reports that Carol Richmond does not drink alcohol and does not use drugs.    ROS Review of Systems  Objective:  BP 102/62   Pulse (!) 58   Temp 98.3 F (36.8 C)   Ht 5\' 4"  (1.626 m)   Wt 146 lb (66.2 kg)   SpO2 96%   BMI 25.06 kg/m   BP Readings from Last 3 Encounters:  06/24/23 102/62  06/02/23 111/64  05/07/23 123/77    Wt Readings from Last 3 Encounters:  06/24/23 146 lb (66.2 kg)  06/02/23 146 lb (66.2 kg)  05/07/23 142 lb 12.8 oz (64.8 kg)     Physical Exam Constitutional:      General: Carol Richmond is not in acute distress.    Appearance: Carol Richmond is well-developed.  HENT:     Right Ear: No middle ear effusion.     Left Ear: No decreased hearing noted. A middle ear effusion is present.  Cardiovascular:     Rate and Rhythm: Normal rate and regular  rhythm.  Pulmonary:     Breath sounds: Normal breath sounds.  Musculoskeletal:        General: Normal range of motion.  Skin:    General: Skin is warm and dry.  Neurological:     Mental Status: Carol Richmond is alert and oriented to person, place, and time.      Assessment & Plan:  SOM (secretory otitis media), left -     Betamethasone  Sod Phos & Acet -     Cefprozil; Take 1 tablet (500 mg total) by  mouth 2 (two) times daily. For infection. Take all of this medication.  Dispense: 20 tablet; Refill: 0  Eustachian tube disorder, left -     Betamethasone  Sod Phos & Acet -     Cefprozil; Take 1 tablet (500 mg total) by mouth 2 (two) times daily. For infection. Take all of this medication.  Dispense: 20 tablet; Refill: 0  Seasonal allergic rhinitis due to pollen -     Fluticasone  Propionate; Place 2 sprays into both nostrils 2 (two) times daily. -     Fexofenadine HCl; Take 1 tablet (180 mg total) by mouth daily. For allergy symptoms  Dispense: 30 tablet; Refill: 11 -     Betamethasone  Sod Phos & Acet     Follow-up: No follow-ups on file.  Roise Cleaver, M.D.

## 2023-07-03 ENCOUNTER — Ambulatory Visit: Payer: Self-pay

## 2023-07-03 ENCOUNTER — Ambulatory Visit: Admitting: Nurse Practitioner

## 2023-07-03 NOTE — Telephone Encounter (Signed)
  Chief Complaint: earache Symptoms: popping sound with chewing, pain that comes and goes Frequency: 2 weeks Pertinent Negatives: Patient denies drainage pain during call Disposition: [] ED /[] Urgent Care (no appt availability in office) / [x] Appointment(In office/virtual)/ []  [facility]/ [] Home Care/ [] Refused Recommended Disposition /[] [facility]/ []  Follow-up with PCP Additional Notes: no openings with PCP- appt made for today Copied from CRM #[id number]. Topic: Clinical - Red Word Triage >> Jul 03, 2023  8:38 AM Danelle Dunning F wrote: Red Word that prompted transfer to Nurse Triage: Having ear pain; Patient has one pill left of her antibiotic pill left that was prescribed by Dr. Veleta Gerold.; She is now experiencing a popping sound when she chews; The pain is off and on; Patient is not feeling any relief and looking for an earlier appointment than Monday Reason for Disposition  Earache  (Exceptions: brief ear pain of < 60 minutes duration, earache occurring during air travel  Answer Assessment - Initial Assessment Questions 1. LOCATION: "Which ear is involved?"     Left ear  2. ONSET: "When did the ear start hurting"      *No Answer* 3. SEVERITY: "How bad is the pain?"  (Scale 1-10; mild, moderate or severe)   - MILD (1-3): doesn't interfere with normal activities    - MODERATE (4-7): interferes with normal activities or awakens from sleep    - SEVERE (8-10): excruciating pain, unable to do any normal activities      *No Answer* 5. FEVER: "Do you have a fever?" If Yes, ask: "What is your temperature, how was it measured, and when did it start?"     *No Answer* 7. OTHER SYMPTOMS: "Do you have any other symptoms?" (e.g., headache, stiff neck, dizziness, vomiting, runny nose, decreased hearing)     Popping sound when chews  Protocols used: Earache-A-AH

## 2023-07-04 ENCOUNTER — Encounter: Payer: Self-pay | Admitting: Nurse Practitioner

## 2023-07-04 ENCOUNTER — Ambulatory Visit

## 2023-07-04 ENCOUNTER — Ambulatory Visit (INDEPENDENT_AMBULATORY_CARE_PROVIDER_SITE_OTHER): Admitting: Nurse Practitioner

## 2023-07-04 VITALS — BP 134/70 | HR 60 | Temp 97.4°F | Ht 64.0 in | Wt 145.0 lb

## 2023-07-04 DIAGNOSIS — M26609 Unspecified temporomandibular joint disorder, unspecified side: Secondary | ICD-10-CM | POA: Diagnosis not present

## 2023-07-04 MED ORDER — MELOXICAM 15 MG PO TABS: 15.0000 mg | ORAL_TABLET | Freq: Every day | ORAL | 0 refills | Status: DC

## 2023-07-04 NOTE — Progress Notes (Signed)
   Subjective:    Patient ID: Jacob Master, female    DOB: February 22, 1950, 73 y.o.   MRN: [id number]  Patient in today c/o ear pain. This started several months ago. Has seen T. Britta Candy with same complaint and they could not find anything. 2 weeks ago she saw DR. Stacks and he told her she ad ear infection and gave her a shot and some antibiotics, but still no better. Pain is intermittent. Rates pain 5-6/10. Other then antibiotics she takes tylenol . She denies grinfing teeth at night but she does has issues with her left jaw popping.   Review of Systems  Constitutional:  Negative for chills and fever.  HENT:  Positive for ear pain (left). Negative for congestion.   Respiratory: Negative.    Cardiovascular: Negative.   Musculoskeletal: Negative.   Neurological:  Negative for dizziness and headaches.       Objective:   Physical Exam Constitutional:      Appearance: Normal appearance.  HENT:     Right Ear: Tympanic membrane normal. There is no impacted cerumen.     Left Ear: Tympanic membrane normal. There is no impacted cerumen.     Nose: Congestion and rhinorrhea present.     Mouth/Throat:     Pharynx: No oropharyngeal exudate or posterior oropharyngeal erythema.     Comments: Left jaw pops when opening and closing mouth Eyes:     Pupils: Pupils are equal, round, and reactive to light.  Cardiovascular:     Rate and Rhythm: Normal rate and regular rhythm.     Heart sounds: Normal heart sounds.  Pulmonary:     Effort: Pulmonary effort is normal.     Breath sounds: Normal breath sounds.  Neurological:     General: No focal deficit present.     Mental Status: She is alert and oriented to person, place, and time.  Psychiatric:        Mood and Affect: Mood normal.        Behavior: Behavior normal.    BP 134/70   Pulse 60   Temp (!) 97.4 F (36.3 C) (Temporal)   Ht 5\' 4"  (1.626 m)   Wt 145 lb (65.8 kg)   SpO2 97%   BMI 24.89 kg/m         Assessment & Plan:    Jacob Master in today with chief complaint of Ear Pain (Left eaer/)   TMJ disorder Avoid chewing gum Stress management Avoid foods that require a lot of chewing Ice if helps - meloxicam  (MOBIC ) 15 MG tablet; Take 1 tablet (15 mg total) by mouth daily.  Dispense: 30 tablet; Refill: 0    The above assessment and management plan was discussed with the patient. The patient verbalized understanding of and has agreed to the management plan. Patient is aware to call the clinic if symptoms persist or worsen. Patient is aware when to return to the clinic for a follow-up visit. Patient educated on when it is appropriate to go to the emergency department.   Mary-Margaret Gaylyn Keas, FNP

## 2023-07-04 NOTE — Patient Instructions (Signed)
 Temporomandibular Joint Syndrome  Temporomandibular joint syndrome (TMJ syndrome) is a condition that causes pain in the temporomandibular joints. These joints are located near your ears and allow your jaw to open and close. For people with TMJ syndrome, chewing, biting, or other movements of the jaw can be difficult or painful. TMJ syndrome is often mild and goes away within a few weeks. However, sometimes the condition becomes a long-term (chronic) problem. What are the causes? This condition may be caused by: Grinding your teeth or clenching your jaw. Some people do this when they are stressed. Arthritis. An injury to the jaw. A head or neck injury. Teeth or dentures that are not aligned well. In some cases, the cause of TMJ syndrome may not be known. What are the signs or symptoms? The most common symptom of this condition is aching pain on the side of the head in the area of the TMJ. Other symptoms may include: Pain when moving your jaw, such as when chewing or biting. Not being able to open your jaw all the way. Making a clicking sound when you open your mouth. Headache. Earache. Neck or shoulder pain. How is this diagnosed? This condition may be diagnosed based on: Your symptoms and medical history. A physical exam. Your health care provider may check the range of motion of your jaw. Imaging tests, such as X-rays or an MRI. You may also need to see your dentist, who will check if your teeth and jaw are lined up correctly. How is this treated? TMJ syndrome often goes away on its own. If treatment is needed, it may include: Eating soft foods and applying ice or heat. Medicines to relieve pain or inflammation. Medicines or massage to relax the muscles. A splint, bite plate, or mouthpiece to prevent teeth grinding or jaw clenching. Relaxation techniques or counseling to help reduce stress. A therapy for pain in which an electrical current is applied to the nerves through the skin  (transcutaneous electrical nerve stimulation). Acupuncture. This may help to relieve pain. Jaw surgery. This is rarely needed. Follow these instructions at home:  Eating and drinking Eat a soft diet if you are having trouble chewing. Avoid foods that require a lot of chewing. Do not chew gum. General instructions Take over-the-counter and prescription medicines only as told by your health care provider. If directed, put ice on the painful area. To do this: Put ice in a plastic bag. Place a towel between your skin and the bag. Leave the ice on for 20 minutes, 2-3 times a day. Remove the ice if your skin turns bright red. This is very important. If you cannot feel pain, heat, or cold, you have a greater risk of damage to the area. Apply a warm, wet cloth (warm compress) to the painful area as told. Massage your jaw area and do any jaw stretching exercises as told by your health care provider. If you were given a splint, bite plate, or mouthpiece, wear it as told by your health care provider. Keep all follow-up visits. This is important. Where to find more information [employer]: [employer] Contact a health care provider if: You have trouble eating. You have new or worsening symptoms. Get help right away if: Your jaw locks. Summary Temporomandibular joint syndrome (TMJ syndrome) is a condition that causes pain in the temporomandibular joints. These joints are located near your ears and allow your jaw to open and close. TMJ syndrome is often mild and goes away within  a few weeks. However, sometimes the condition becomes a long-term (chronic) problem. Symptoms include an aching pain on the side of the head in the area of the TMJ, pain when chewing or biting, and being unable to open your jaw all the way. You may also make a clicking sound when you open your mouth. TMJ syndrome often goes away on its own. If treatment is needed, it may  include medicines to relieve pain, reduce inflammation, or relax the muscles. A splint, bite plate, or mouthpiece may also be used to prevent teeth grinding or jaw clenching. This information is not intended to replace advice given to you by your health care provider. Make sure you discuss any questions you have with your health care provider. Document Revised: 09/17/2020 Document Reviewed: 09/17/2020 Elsevier Patient Education  2024 [employer].

## 2023-07-07 ENCOUNTER — Ambulatory Visit: Admitting: Family Medicine

## 2023-07-07 ENCOUNTER — Other Ambulatory Visit (INDEPENDENT_AMBULATORY_CARE_PROVIDER_SITE_OTHER): Payer: Self-pay | Admitting: Gastroenterology

## 2023-07-07 ENCOUNTER — Encounter: Payer: Self-pay | Admitting: Family Medicine

## 2023-07-07 VITALS — BP 108/61 | HR 60 | Temp 97.4°F | Ht 64.0 in | Wt 150.0 lb

## 2023-07-07 DIAGNOSIS — F411 Generalized anxiety disorder: Secondary | ICD-10-CM | POA: Diagnosis not present

## 2023-07-07 DIAGNOSIS — F1320 Sedative, hypnotic or anxiolytic dependence, uncomplicated: Secondary | ICD-10-CM | POA: Diagnosis not present

## 2023-07-07 DIAGNOSIS — M26609 Unspecified temporomandibular joint disorder, unspecified side: Secondary | ICD-10-CM | POA: Diagnosis not present

## 2023-07-07 DIAGNOSIS — K7682 Hepatic encephalopathy: Secondary | ICD-10-CM

## 2023-07-07 DIAGNOSIS — F339 Major depressive disorder, recurrent, unspecified: Secondary | ICD-10-CM | POA: Diagnosis not present

## 2023-07-07 NOTE — Patient Instructions (Signed)
 Temporomandibular Joint Syndrome  Temporomandibular joint syndrome (TMJ syndrome) is a condition that causes pain in the temporomandibular joints. These joints are located near your ears and allow your jaw to open and close. For people with TMJ syndrome, chewing, biting, or other movements of the jaw can be difficult or painful. TMJ syndrome is often mild and goes away within a few weeks. However, sometimes the condition becomes a long-term (chronic) problem. What are the causes? This condition may be caused by: Grinding your teeth or clenching your jaw. Some people do this when they are stressed. Arthritis. An injury to the jaw. A head or neck injury. Teeth or dentures that are not aligned well. In some cases, the cause of TMJ syndrome may not be known. What are the signs or symptoms? The most common symptom of this condition is aching pain on the side of the head in the area of the TMJ. Other symptoms may include: Pain when moving your jaw, such as when chewing or biting. Not being able to open your jaw all the way. Making a clicking sound when you open your mouth. Headache. Earache. Neck or shoulder pain. How is this diagnosed? This condition may be diagnosed based on: Your symptoms and medical history. A physical exam. Your health care provider may check the range of motion of your jaw. Imaging tests, such as X-rays or an MRI. You may also need to see your dentist, who will check if your teeth and jaw are lined up correctly. How is this treated? TMJ syndrome often goes away on its own. If treatment is needed, it may include: Eating soft foods and applying ice or heat. Medicines to relieve pain or inflammation. Medicines or massage to relax the muscles. A splint, bite plate, or mouthpiece to prevent teeth grinding or jaw clenching. Relaxation techniques or counseling to help reduce stress. A therapy for pain in which an electrical current is applied to the nerves through the skin  (transcutaneous electrical nerve stimulation). Acupuncture. This may help to relieve pain. Jaw surgery. This is rarely needed. Follow these instructions at home:  Eating and drinking Eat a soft diet if you are having trouble chewing. Avoid foods that require a lot of chewing. Do not chew gum. General instructions Take over-the-counter and prescription medicines only as told by your health care provider. If directed, put ice on the painful area. To do this: Put ice in a plastic bag. Place a towel between your skin and the bag. Leave the ice on for 20 minutes, 2-3 times a day. Remove the ice if your skin turns bright red. This is very important. If you cannot feel pain, heat, or cold, you have a greater risk of damage to the area. Apply a warm, wet cloth (warm compress) to the painful area as told. Massage your jaw area and do any jaw stretching exercises as told by your health care provider. If you were given a splint, bite plate, or mouthpiece, wear it as told by your health care provider. Keep all follow-up visits. This is important. Where to find more information [employer]: [employer] Contact a health care provider if: You have trouble eating. You have new or worsening symptoms. Get help right away if: Your jaw locks. Summary Temporomandibular joint syndrome (TMJ syndrome) is a condition that causes pain in the temporomandibular joints. These joints are located near your ears and allow your jaw to open and close. TMJ syndrome is often mild and goes away within  a few weeks. However, sometimes the condition becomes a long-term (chronic) problem. Symptoms include an aching pain on the side of the head in the area of the TMJ, pain when chewing or biting, and being unable to open your jaw all the way. You may also make a clicking sound when you open your mouth. TMJ syndrome often goes away on its own. If treatment is needed, it may  include medicines to relieve pain, reduce inflammation, or relax the muscles. A splint, bite plate, or mouthpiece may also be used to prevent teeth grinding or jaw clenching. This information is not intended to replace advice given to you by your health care provider. Make sure you discuss any questions you have with your health care provider. Document Revised: 09/17/2020 Document Reviewed: 09/17/2020 Elsevier Patient Education  2024 [employer].

## 2023-07-07 NOTE — Progress Notes (Signed)
   Acute Office Visit  Subjective:     [name], female    DOB: 1950-10-26, 73 y.o.   MRN: [id number]  Chief Complaint  Patient presents with   Temporomandibular Joint Pain    Otalgia  There is pain in the left ear. This is a recurrent problem. The current episode started 1 to 4 weeks ago. The problem has been waxing and waning. There has been no fever. Associated symptoms include headaches (left frontal), hearing loss (chronic, unchanged) and rhinorrhea. Pertinent negatives include no coughing, ear discharge or sore throat. She has tried antibiotics and NSAIDs (steroid shot) for the symptoms. The treatment provided no relief.   Started mobic  3 days ago. Hasn't been doing TMJ exercises.   Her sister passed a few weeks ago. This has been tough. She has been compliant with celexa . Has been taking ativan  BID.   Review of Systems  HENT:  Positive for ear pain, hearing loss (chronic, unchanged) and rhinorrhea. Negative for ear discharge and sore throat.   Respiratory:  Negative for cough.   Neurological:  Positive for headaches (left frontal).        Objective:    BP 108/61   Pulse 60   Temp (!) 97.4 F (36.3 C) (Temporal)   Ht 5\' 4"  (1.626 m)   Wt 150 lb (68 kg)   SpO2 99%   BMI 25.75 kg/m    Physical Exam Vitals and nursing note reviewed.  Constitutional:      General: She is not in acute distress.    Appearance: She is not ill-appearing, toxic-appearing or diaphoretic.  HENT:     Head:     Jaw: Tenderness (mild, left) present. No trismus, swelling, pain on movement or malocclusion.     Comments: Crepitus of left TMJ.     Right Ear: Tympanic membrane, ear canal and external ear normal.     Left Ear: Tympanic membrane, ear canal and external ear normal.     Mouth/Throat:     Mouth: Mucous membranes are moist. No oral lesions.     Pharynx: Oropharynx is clear.  Cardiovascular:     Rate and Rhythm: Normal rate and regular rhythm.     Heart  sounds: Normal heart sounds. No murmur heard. Pulmonary:     Effort: Pulmonary effort is normal. No respiratory distress.     Breath sounds: Normal breath sounds. No wheezing.  Musculoskeletal:     Right lower leg: No edema.     Left lower leg: No edema.  Skin:    General: Skin is warm and dry.  Neurological:     Mental Status: She is alert and oriented to person, place, and time. Mental status is at baseline.  Psychiatric:        Mood and Affect: Mood normal.        Behavior: Behavior normal.     No results found for any visits on 07/07/23.      Assessment & Plan:   Carol Richmond was seen today for temporomandibular joint pain.  Diagnoses and all orders for this visit:  TMJ (temporomandibular joint syndrome) Continue mobic  prn. Discussed exercises, heat, ice, avoidance of crunchy foods, hard candy, chewing gum.   Depression, recurrent (HCC) Generalized anxiety disorder Benzodiazepine dependence (HCC) Continue celexa , ativan  BID prn. Declined counseling referral.   Keep scheduled follow up appt.   The patient indicates understanding of these issues and agrees with the plan.  Albertha Huger, FNP

## 2023-07-10 ENCOUNTER — Ambulatory Visit: Payer: Self-pay

## 2023-07-10 ENCOUNTER — Encounter: Payer: Self-pay | Admitting: Nurse Practitioner

## 2023-07-10 ENCOUNTER — Ambulatory Visit: Payer: HMO | Attending: Nurse Practitioner | Admitting: Nurse Practitioner

## 2023-07-10 VITALS — BP 124/72 | HR 60 | Ht 64.0 in | Wt 149.0 lb

## 2023-07-10 DIAGNOSIS — Z950 Presence of cardiac pacemaker: Secondary | ICD-10-CM | POA: Diagnosis not present

## 2023-07-10 DIAGNOSIS — I5022 Chronic systolic (congestive) heart failure: Secondary | ICD-10-CM | POA: Diagnosis not present

## 2023-07-10 DIAGNOSIS — I052 Rheumatic mitral stenosis with insufficiency: Secondary | ICD-10-CM

## 2023-07-10 DIAGNOSIS — I4891 Unspecified atrial fibrillation: Secondary | ICD-10-CM

## 2023-07-10 DIAGNOSIS — I340 Nonrheumatic mitral (valve) insufficiency: Secondary | ICD-10-CM

## 2023-07-10 DIAGNOSIS — I059 Rheumatic mitral valve disease, unspecified: Secondary | ICD-10-CM

## 2023-07-10 DIAGNOSIS — Z8679 Personal history of other diseases of the circulatory system: Secondary | ICD-10-CM | POA: Diagnosis not present

## 2023-07-10 DIAGNOSIS — I050 Rheumatic mitral stenosis: Secondary | ICD-10-CM

## 2023-07-10 NOTE — Telephone Encounter (Signed)
 Chief Complaint: pain in left ear  Disposition: [] ED /[] Urgent Care (no appt availability in office) / [] Appointment(In office/virtual)/ []  [facility]/ [] Home Care/ [] Refused Recommended Disposition /[] [facility]/ [x]  Follow-up with PCP Additional Notes: Pt states she is still having the left ear pain. States ear pain is going on for a while. Pt states that she is wanting a rx fo the pain. States that when her cardiology is supposed to be sending over some papers as well and wants a call about the papers. Pt was giving instructions from her recent visit with PCP and the TMJ. Pt then hung up the phone after saying she wishes there was a ear doctor around the area.    Copied From CRM [id number]. Reason for Triage: patient is calling saying that she has seen the provider for her left ear but it is not getting any better she wants to know if she can get an rx please call patient back  [phone] (M)    Reason for Disposition . [1] Caller requesting NON-URGENT health information AND [2] PCP's office is the best resource  Protocols used: Information Only Call - No Triage-A-AH

## 2023-07-10 NOTE — Progress Notes (Unsigned)
 Cardiology Office Note:  .   Date:  07/10/2023 ID:  Carol Richmond, DOB Jan 15, 1951, MRN [id number] PCP: Albertha Huger, FNP  [facility] Providers Cardiologist:  Teddie Favre, MD Cardiology APP:  Lasalle Pointer, NP  Electrophysiologist:  Manya Sells, MD    History of Present Illness: Carol Richmond is a very pleasant 73 y.o. female with a PMH of rheumatic MV disease, moderate to severe mitral valve stenosis, HFmrEF, AVNRT, s/p RF ablation, OSA, CHB, s/p PPM, HTN, carotid artery disease, T2DM, hx of TIA, NASH cirrhosis, G1 EV, GERD, who presents today for follow-up.   Admitted 07/2022 for A-fib/A-flutter with RVR. Was initiated on Amiodarone , transferred to [facility]. Underwent TEE/DCCV on 08/14/2022. TEE revealed EF 25-30%, severely reduced RV systolic function, severe mitral valve regurgitation with severe mitral stenosis - see full report below.   Seen for follow-up on 10/09/2022 and chief concern was DOE noted for the last couple of weeks, was not bothersome per her report. Denied any chest pain, palpitations, syncope, presyncope, dizziness, orthopnea, PND, swelling or significant weight changes, acute bleeding, or claudication.  Tearful during interview at one point during conversation, admitted to recent stress.  Hospital admission 10/16/22 - 10/21/22 for AKI, UTI, and metabolic encephalopathy. Was noted to have confusion and falls on arrival to ED. CT of head unremarkable. Received tx for UTI. Hospital course noted by supra-therapeutic INR.   12/06/2022 - Today she presents for follow-up. She states she fell again a few weeks ago, denies any acute injuries.  Tearful during interview as she expresses fears about living alone.  Requesting to live with youngest daughter.  Denies any chest pain, shortness of breath, palpitations, syncope, presyncope, dizziness, orthopnea, PND, swelling or significant weight changes, acute bleeding, or claudication.  Expresses some frustration  with current primary care.  Says she will be going to see a neurologist soon.  Says she does not want to undergo colonoscopy/EGD as she has been told this will be high risk due to her heart condition.  She does have concerns regarding open heart surgery, and does not want to proceed with workup for this.   03/11/2023 - Presents today for follow-up.  She tells me she continues to have balance issues as well as falls.  She says most recently, she was preparing materials to help salt the pavement in front of her house due to this now, says she lost her balance and footing and fell and hit her head.  Denies any red flag signs or symptoms, does say she has a "knot" that is resolving along her forehead.  Says she did not go to the hospital after this fall. Denies any chest pain, shortness of breath, palpitations, syncope, presyncope, dizziness, orthopnea, PND, swelling or significant weight changes, acute bleeding, or claudication.  07/10/2023 -  Here for follow-up. Admits to intermittent, atypical CP recently for one time occurrence, denies any other recent CP. Main chief concern is left ear pain, PCP is aware and managing this. Denies any shortness of breath, palpitations, syncope, presyncope, dizziness, orthopnea, PND, swelling or significant weight changes, acute bleeding, or claudication.  ROS: Negative. See HPI.  Studies Reviewed: .    Echo limited 11/2022: 1. Limited Echo for LVEF assessment.   2. Left ventricular ejection fraction, by estimation, is 45 to 50%. The left ventricle has mildly decreased function. The left ventricle  demonstrates regional wall motion abnormalities (see scoring  diagram/findings for description). Left ventricular  diastolic parameters are consistent with Grade I  diastolic dysfunction  (impaired relaxation). Elevated left ventricular end-diastolic pressure.   3. Right ventricular systolic function is normal. The right ventricular  size is normal. Tricuspid regurgitation  signal is inadequate for assessing  PA pressure.  Carotid duplex 09/2022: Summary:  Right Carotid: Velocities in the right ICA are consistent with a 1-39% stenosis.   Left Carotid: Velocities in the left ICA are consistent with a 1-39%  stenosis.   Vertebrals: Bilateral vertebral arteries demonstrate antegrade flow.  Subclavians: Normal flow hemodynamics were seen in bilateral subclavian arteries.  TEE 08/14/2022:  1. Left ventricular ejection fraction, by estimation, is 25 to 30%. The  left ventricle has severely decreased function. The left ventricle  demonstrates global hypokinesis.   2. Right ventricular systolic function is severely reduced. The right  ventricular size is mildly enlarged.   3. Left atrial size was moderately dilated. No left atrial/left atrial  appendage thrombus was detected.   4. The mitral valve is rheumatic. Severe mitral valve regurgitation.  Severe mitral stenosis.   5. The aortic valve is tricuspid. Aortic valve regurgitation is trivial.  Risk Assessment/Calculations:    CHA2DS2-VASc Score = 7  This indicates a 11.2% annual risk of stroke. The patient's score is based upon: CHF History: 1 HTN History: 1 Diabetes History: 1 Stroke History: 2 Vascular Disease History: 0 Age Score: 1 Gender Score: 1   The 10-year ASCVD risk score (Arnett DK, et al., 2019) is: 23.5%   Values used to calculate the score:     Age: 45 years     Sex: Female     Is Non-Hispanic African American: No     Diabetic: Yes     Tobacco smoker: No     Systolic Blood Pressure: 124 mmHg     Is BP treated: Yes     HDL Cholesterol: 73 mg/dL     Total Cholesterol: 218 mg/dL      Physical Exam:   VS:  BP 124/72   Pulse 60   Ht 5\' 4"  (1.626 m)   Wt 149 lb (67.6 kg)   SpO2 99%   BMI 25.58 kg/m    Wt Readings from Last 3 Encounters:  07/10/23 149 lb (67.6 kg)  07/07/23 150 lb (68 kg)  07/04/23 145 lb (65.8 kg)    GEN: Well nourished, well developed in no acute  distress NECK: No JVD; No carotid bruits CARDIAC: S1/S2, RRR, Grade 2/6 murmur, no rubs, no gallops RESPIRATORY:  Clear to auscultation without rales, wheezing or rhonchi  ABDOMEN: Soft, non-tender, non-distended EXTREMITIES:  No edema; No deformity, mild tremors noted along bilateral forearms  ASSESSMENT AND PLAN: .    HFmrEF Stage C, NYHA class I-II symptoms. EF mildly reduced at 45-50%. Euvolemic and well compensated on exam. Continue Entresto  and Lopressor  - cannot switch to Coreg  at this time d/t current BP. Continue current GDMT. Low sodium diet, fluid restriction <2L, and daily weights encouraged. Educated to contact our office for weight gain of 2 lbs overnight or 5 lbs in one week. Will update Echo 11/2023.   A-fib/A-flutter, s/p DCCV, AVNRT, s/p ablation Underwent previous successful DCCV. Denies any recent palpitations or tachycardia. Continue Lopressor  and Amiodarone . Past LFT and Thyroid  panel results were WNL. Continue coumadin  and continue to f/u with Coumadin  Clinic. Heart healthy diet and regular cardiovascular exercise encouraged.   Rheumatic mitral valve disease, severe mitral stenosis with severe regurgitation Past TEE revealed mitral valve was not amenable to valvuloplasty due to severity of mitral insufficiency and due  to severe/asymmetrical focal calcification of mid anterior leaflet.  Severe MR and mitral valve stenosis noted on TEE 07/2022, with mean gradient at 9.5 mmHg as well as mitral valve was noted to be rheumatic. Continue current medication regimen.  ED precautions discussed. Previously discussed results with her during office visit and discussed treatment options/plan of care. Pt declines surgery or intervention. Will update Echo 11/2023.   CHB, s/p PPM Doing well. Normal device function noted during last device check.  Continue follow-up with Dr. Carolynne Citron.  Dispo: Follow-up with me or APP in 4-6 months or sooner if anything changes.   Signed, Lasalle Pointer, NP

## 2023-07-10 NOTE — Telephone Encounter (Signed)
 I spoke to pt and advised Tiffany had discussed her otalgia at the last visit and at this time nothing else can be called in but if pt would like she an call her ENT and schedule a visit but pt declines. Also it looks like she saw cardiology today and nothing was said about sending us  forms so I believe they were just letting pt know Tiffany would be able to see the OV notes from todays visit and pt voiced understanding.

## 2023-07-10 NOTE — Patient Instructions (Addendum)

## 2023-07-21 ENCOUNTER — Ambulatory Visit: Payer: Self-pay

## 2023-07-21 NOTE — Telephone Encounter (Signed)
 Called patient to follow up and see if she would like an earlier appointment. No answer, no option to leave message

## 2023-07-21 NOTE — Telephone Encounter (Signed)
 Triage incomplete. After mutiple attempts to triage, the phones kept disconnecting. RN called CAL and CAL will call pt to set up appt. Pt has vaginal itching.        Copied from CRM [id number]. Topic: Clinical - Red Word Triage >> Jul 21, 2023 10:58 AM Ivette P wrote: Red Word that prompted transfer to Nurse Triage: pt is having UTI symptoms. Uncomfortable. Itching burning, pt bought a cream and not helping. Reason for Disposition  [1] Vaginal itching AND [2] not improved > 3 days following Care Advice  Answer Assessment - Initial Assessment Questions 1. SYMPTOM: "What's the main symptom you're concerned about?" (e.g., pain, itching, dryness)     Itching  2. LOCATION: "Where is the   located?" (e.g., inside/outside, left/right)     Outside  3. ONSET: "When did the    start?"     Not sure  4. PAIN: "Is there any pain?" If Yes, ask: "How bad is it?" (Scale: 1-10; mild, moderate, severe)   -  MILD (1-3): Doesn't interfere with normal activities.    -  MODERATE (4-7): Interferes with normal activities (e.g., work or school) or awakens from sleep.     -  SEVERE (8-10): Excruciating pain, unable to do any normal activities.     Not sure  5. ITCHING: "Is there any itching?" If Yes, ask: "How bad is it?" (Scale: 1-10; mild, moderate, severe)     Not sure  6. CAUSE: "What do you think is causing the discharge?" "Have you had the same problem before? What happened then?"     Not sure 7. OTHER SYMPTOMS: "Do you have any other symptoms?" (e.g., fever, itching, vaginal bleeding, pain with urination, injury to genital area, vaginal foreign body)     Not sure  Protocols used: Vaginal Symptoms-A-AH

## 2023-07-24 ENCOUNTER — Telehealth (INDEPENDENT_AMBULATORY_CARE_PROVIDER_SITE_OTHER): Payer: Self-pay | Admitting: Gastroenterology

## 2023-07-24 NOTE — Telephone Encounter (Signed)
 Pt left voicemail stating she was returning a call. Looked in chart but I do not see where we had reached out to pt. Returned call to pt but had to leave message. Per chart it may have been a missed call on 07/21/23 from another office

## 2023-07-25 ENCOUNTER — Telehealth: Payer: Self-pay

## 2023-07-25 NOTE — Telephone Encounter (Signed)
 Copied from CRM [id number]. Topic: Clinical - Medication Question >> Jul 25, 2023 10:36 AM Carol Richmond wrote: Reason for CRM: Patient is calling in because she wants to know if it's okay to take Benadryl  for her allergies. Patient is requesting [name] call her back and let her know.

## 2023-07-25 NOTE — Telephone Encounter (Signed)
 I spoke to pt and advised she has montelukast  she should be taking every night for seasonal allergies and pt says she will start taking it. Also pt states her insurance didn't cover the allegra . Advised pt to take childrens benadryl  or non drowsy benadryl  and pt voiced understanding.

## 2023-07-29 ENCOUNTER — Ambulatory Visit: Attending: Cardiology | Admitting: *Deleted

## 2023-07-29 ENCOUNTER — Ambulatory Visit: Payer: Self-pay

## 2023-07-29 DIAGNOSIS — I4891 Unspecified atrial fibrillation: Secondary | ICD-10-CM | POA: Diagnosis not present

## 2023-07-29 DIAGNOSIS — Z5181 Encounter for therapeutic drug level monitoring: Secondary | ICD-10-CM | POA: Diagnosis not present

## 2023-07-29 LAB — POCT INR: INR: 2.6 (ref 2.0–3.0)

## 2023-07-29 NOTE — Patient Instructions (Signed)
 Continue warfarin 1 tablet daily except 1/2 tablet on Tuesdays and Saturdays Recheck in 6 wks

## 2023-07-29 NOTE — Telephone Encounter (Signed)
 Appt made

## 2023-07-29 NOTE — Telephone Encounter (Signed)
 FYI Only or Action Required?: FYI only for provider  Patient was last seen in primary care on 07/07/2023 by Albertha Huger, FNP. Called Nurse Triage reporting Bleeding/Bruising. Symptoms began Saturday. Interventions attempted: OTC medications: TAO/Clean Dressing and Rest, hydration, or home remedies. Symptoms are: stable.  Triage Disposition: Home Care  Patient/caregiver understands and will follow disposition?: Yes              Copied from CRM [id number]. Topic: Clinical - Red Word Triage >> Jul 29, 2023  8:01 AM Juluis Ok wrote: [facility] that prompted transfer to Nurse Triage: Patient on blood thinner, states she has a scab that has been bleeding since Saturday Reason for Disposition  Minor cut or scratch  Answer Assessment - Initial Assessment Questions 1. APPEARANCE of INJURY: "What does the injury look like?"      Pt notes scab fell off in shower, began to bleed 2. SIZE: "How large is the cut?"      Unable to determine with patient 3. BLEEDING: "Is it bleeding now?" If Yes, ask: "Is it difficult to stop?"      No, currently dressed and cleaned from overnight dressing 4. LOCATION: "Where is the injury located?"      Wrist 5. ONSET: "How long ago did the injury occur?"      Saturday 6. MECHANISM: "Tell me how it happened."      Pt reports she was in the shower and scab on arm fell off  Protocols used: Cuts and Lacerations-A-AH

## 2023-07-30 ENCOUNTER — Telehealth: Payer: Self-pay | Admitting: Family Medicine

## 2023-07-30 ENCOUNTER — Ambulatory Visit (INDEPENDENT_AMBULATORY_CARE_PROVIDER_SITE_OTHER): Admitting: Family Medicine

## 2023-07-30 VITALS — BP 100/60 | HR 60 | Temp 97.6°F | Ht 64.0 in | Wt 148.2 lb

## 2023-07-30 DIAGNOSIS — E1159 Type 2 diabetes mellitus with other circulatory complications: Secondary | ICD-10-CM

## 2023-07-30 DIAGNOSIS — I8500 Esophageal varices without bleeding: Secondary | ICD-10-CM | POA: Diagnosis not present

## 2023-07-30 DIAGNOSIS — F339 Major depressive disorder, recurrent, unspecified: Secondary | ICD-10-CM | POA: Diagnosis not present

## 2023-07-30 DIAGNOSIS — Z95811 Presence of heart assist device: Secondary | ICD-10-CM | POA: Insufficient documentation

## 2023-07-30 DIAGNOSIS — N898 Other specified noninflammatory disorders of vagina: Secondary | ICD-10-CM

## 2023-07-30 DIAGNOSIS — E1165 Type 2 diabetes mellitus with hyperglycemia: Secondary | ICD-10-CM

## 2023-07-30 DIAGNOSIS — I5022 Chronic systolic (congestive) heart failure: Secondary | ICD-10-CM

## 2023-07-30 DIAGNOSIS — F411 Generalized anxiety disorder: Secondary | ICD-10-CM

## 2023-07-30 DIAGNOSIS — K7460 Unspecified cirrhosis of liver: Secondary | ICD-10-CM | POA: Diagnosis not present

## 2023-07-30 DIAGNOSIS — N1831 Chronic kidney disease, stage 3a: Secondary | ICD-10-CM | POA: Diagnosis not present

## 2023-07-30 DIAGNOSIS — Z79899 Other long term (current) drug therapy: Secondary | ICD-10-CM | POA: Diagnosis not present

## 2023-07-30 DIAGNOSIS — Z7984 Long term (current) use of oral hypoglycemic drugs: Secondary | ICD-10-CM

## 2023-07-30 DIAGNOSIS — E1169 Type 2 diabetes mellitus with other specified complication: Secondary | ICD-10-CM

## 2023-07-30 DIAGNOSIS — I4891 Unspecified atrial fibrillation: Secondary | ICD-10-CM | POA: Diagnosis not present

## 2023-07-30 DIAGNOSIS — F1320 Sedative, hypnotic or anxiolytic dependence, uncomplicated: Secondary | ICD-10-CM

## 2023-07-30 LAB — WET PREP FOR TRICH, YEAST, CLUE
Trichomonas Exam: NEGATIVE
Yeast Exam: NEGATIVE

## 2023-07-30 LAB — BAYER DCA HB A1C WAIVED: HB A1C (BAYER DCA - WAIVED): 5.4 % (ref 4.8–5.6)

## 2023-07-30 MED ORDER — LORAZEPAM 0.5 MG PO TABS
0.5000 mg | ORAL_TABLET | Freq: Two times a day (BID) | ORAL | 5 refills | Status: AC | PRN
Start: 2023-08-09 — End: ?

## 2023-07-30 NOTE — Telephone Encounter (Signed)
No return call will close encounter. 

## 2023-07-30 NOTE — Progress Notes (Signed)
 Established Patient Office Visit  Subjective   [name], female    DOB: 08/13/1950  Age: 72 y.o. MRN: [id number]  Chief Complaint  Patient presents with   Medical Management of Chronic Issues    HPI  DM Pt presents for follow up evaluation of Type 2 diabetes mellitus. Patient denies foot ulcerations, increased appetite, nausea, paresthesia of the feet, polydipsia, polyuria, visual disturbances, vomiting, and weight loss.  Current diabetic medications include: metformin  500 mg BID Compliant with meds - Yes  Current monitoring regimen: not checking  2. HTN/ A fib/ CHF Complaint with meds - Yes Current Medications -  amiodarone , lasix , metoprolol , entreso, coumadin  Pertinent ROS:  Headache - No Fatigue - No Visual Disturbances - No Chest pain - No Dyspnea - No Palpitations - No LE edema - No  Followed closely by cardiology   3.  HLD She has declined medications. She has improved her diet. Weight has continued to trend down.  4. Cirrhosis Denies abdominal pain, nausea, vomiting, itching, hematemesis.   5. Anxiety/depression Compliant with celexa .Taking ativan  BID prn- seems to be taking this consistently. Feeling lonely lately. Denies SI. Stays active in the church. She had declined referral for counseling numerous times.   7. Vaginal itching Off and on for 1 month. Try OTC monistat with short term relief.      07/07/2023   10:14 AM 07/04/2023    8:00 AM 06/24/2023   10:41 AM  Depression screen PHQ 2/9  Decreased Interest 0 0 0  Down, Depressed, Hopeless 0 0 0  PHQ - 2 Score 0 0 0  Altered sleeping 0 0 0  Tired, decreased energy 0 0 0  Change in appetite 0 0 0  Feeling bad or failure about yourself  0 0 0  Trouble concentrating 0 0 0  Moving slowly or fidgety/restless 0 0 0  Suicidal thoughts 0 0 0  PHQ-9 Score 0 0 0  Difficult doing work/chores Not difficult at all Not difficult at all Not difficult at all      07/07/2023   10:14 AM  07/04/2023    8:00 AM 06/24/2023   10:41 AM 06/02/2023    7:58 AM  GAD 7 : Generalized Anxiety Score  Nervous, Anxious, on Edge 0 0 0 0  Control/stop worrying 0 0 0 0  Worry too much - different things 0 0 0 0  Trouble relaxing 0 0 0 0  Restless 0 0 0 0  Easily annoyed or irritable 0 0 0 0  Afraid - awful might happen 0 0 0 0  Total GAD 7 Score 0 0 0 0  Anxiety Difficulty Not difficult at all Not difficult at all Not difficult at all Not difficult at all      Past Medical History:  Diagnosis Date   Asthma    AVNRT (AV nodal re-entry tachycardia) (HCC)    Carotid artery disease (HCC)    Cirrhosis (HCC)    Diverticulosis of colon (without mention of hemorrhage)    Essential hypertension    Functional ovarian cysts    GERD (gastroesophageal reflux disease)    Hemorrhoids, external, without mention of complication    Hormone replacement therapy (postmenopausal)    IBS (irritable bowel syndrome)    Migraine    Mitral valve disease    Pacemaker    Complete heart block - Medtronic   Right knee DJD    Seizures (HCC)    Sleep apnea    TIA (transient  ischemic attack)    Type 2 diabetes mellitus (HCC)       ROS As per HPI.    Objective:     BP 100/60   Pulse 60   Temp 97.6 F (36.4 C) (Temporal)   Ht 5' 4 (1.626 m)   Wt 148 lb 3.2 oz (67.2 kg)   SpO2 99%   BMI 25.44 kg/m  Wt Readings from Last 3 Encounters:  07/30/23 148 lb 3.2 oz (67.2 kg)  07/10/23 149 lb (67.6 kg)  07/07/23 150 lb (68 kg)     Physical Exam Vitals and nursing note reviewed.  Constitutional:      General: She is not in acute distress.    Appearance: Normal appearance. She is not ill-appearing, toxic-appearing or diaphoretic.  HENT:     Mouth/Throat:     Lips: No lesions.     Mouth: No lacerations, oral lesions or angioedema.     Tongue: No lesions. Tongue does not deviate from midline.     Palate: No mass and lesions.     Pharynx: Oropharynx is clear. No pharyngeal swelling, uvula  swelling or postnasal drip.     Tonsils: No tonsillar exudate or tonsillar abscesses.  Eyes:     General: No scleral icterus. Neck:     Thyroid : No thyroid  mass, thyromegaly or thyroid  tenderness.  Cardiovascular:     Rate and Rhythm: Normal rate and regular rhythm.     Heart sounds: Murmur heard.     Systolic murmur is present with a grade of 2/6.  Pulmonary:     Effort: Pulmonary effort is normal. No respiratory distress.     Breath sounds: Normal breath sounds.  Abdominal:     General: Bowel sounds are normal. There is no distension.     Palpations: Abdomen is soft.     Tenderness: There is no abdominal tenderness. There is no guarding or rebound.  Musculoskeletal:     Cervical back: Neck supple.     Right lower leg: No edema.     Left lower leg: No edema.  Lymphadenopathy:     Cervical: No cervical adenopathy.  Skin:    General: Skin is warm and dry.     Coloration: Skin is not jaundiced.  Neurological:     Mental Status: She is alert and oriented to person, place, and time. Mental status is at baseline.     Gait: Gait (using cane) normal.  Psychiatric:        Attention and Perception: Attention normal.        Mood and Affect: Mood normal.        Speech: Speech normal.        Behavior: Behavior normal.        Cognition and Memory: Cognition is impaired. Memory is impaired.      No results found for any visits on 07/30/23.    The 10-year ASCVD risk score (Arnett DK, et al., 2019) is: 15.9%    Assessment & Plan:   Carol Richmond was seen today for medical management of chronic issues.  Diagnoses and all orders for this visit:  Type 2 diabetes mellitus with hyperglycemia, without long-term current use of insulin  (HCC) A1c pending. Has been well controlled with metformin . On ARB. Declined statin.  -     Bayer DCA Hb A1c Waived  Long term current use of oral hypoglycemic drug  Hyperlipidemia associated with type 2 diabetes mellitus (HCC) Last LDL at 104. Declined  statin.   Hypertension associated with  type 2 diabetes mellitus (HCC) Well controlled on current regimen.   Chronic HFrEF (heart failure with reduced ejection fraction) (HCC) Euvolemic on exam.   Atrial fibrillation, unspecified type (HCC) On long term anticoagulation. Denies symptoms. Rate controlled.   Presence of heart assist device [facility] Continue follow up with cardiology.   Chronic kidney disease, stage 3a (HCC) -     CBC with Differential/Platelet -     CMP14+EGFR -     VITAMIN D  25 Hydroxy (Vit-D Deficiency, Fractures)  Benzodiazepine dependence (HCC) CSA and UDS updated today. PDMP reviewed, no red flags.  -     ToxASSURE Select 13 (MW), Urine -     LORazepam  (ATIVAN ) 0.5 MG tablet; Take 1 tablet (0.5 mg total) by mouth 2 (two) times daily as needed for anxiety.  Depression, recurrent (HCC) Generalized anxiety disorder Continue celexa  daily. Lorazepam  prn.  -     ToxASSURE Select 13 (MW), Urine -     LORazepam  (ATIVAN ) 0.5 MG tablet; Take 1 tablet (0.5 mg total) by mouth 2 (two) times daily as needed for anxiety.  Controlled substance agreement signed -     ToxASSURE Select 13 (MW), Urine -     LORazepam  (ATIVAN ) 0.5 MG tablet; Take 1 tablet (0.5 mg total) by mouth 2 (two) times daily as needed for anxiety.  Esophageal varices without bleeding, unspecified esophageal varices type (HCC) Denies bleeding.   Cirrhosis, nonalcoholic (HCC) LFTs have been stable. No ascites.   Vaginal itching -     WET PREP FOR TRICH, YEAST, CLUE   Return in about 3 months (around 10/30/2023) for chronic follow up.   The patient indicates understanding of these issues and agrees with the plan.  Albertha Huger, FNP

## 2023-07-30 NOTE — Telephone Encounter (Signed)
 Pt had questions regarding her dressing placed during the visit today and whether or not she can take a shower with it on. Advised pt to leave the tegaderm in place for 5 days and ok to shower with it on. Pt voiced understanding.

## 2023-07-30 NOTE — Telephone Encounter (Signed)
 Copied from CRM [id number]. Topic: General - Call Back - No Documentation >> Jul 30, 2023  2:34 PM Minus Amel G wrote: Reason for CRM: patient has a question about a bandage that was placed on her wrist. She asking for Carol Richmond nurse [name]. [phone]

## 2023-07-31 ENCOUNTER — Telehealth: Payer: Self-pay

## 2023-07-31 LAB — CBC WITH DIFFERENTIAL/PLATELET
Basophils Absolute: 0 10*3/uL (ref 0.0–0.2)
Basos: 0 %
EOS (ABSOLUTE): 0.1 10*3/uL (ref 0.0–0.4)
Eos: 1 %
Hematocrit: 33.1 % — ABNORMAL LOW (ref 34.0–46.6)
Hemoglobin: 11 g/dL — ABNORMAL LOW (ref 11.1–15.9)
Immature Grans (Abs): 0 10*3/uL (ref 0.0–0.1)
Immature Granulocytes: 0 %
Lymphocytes Absolute: 2.2 10*3/uL (ref 0.7–3.1)
Lymphs: 26 %
MCH: 31.4 pg (ref 26.6–33.0)
MCHC: 33.2 g/dL (ref 31.5–35.7)
MCV: 95 fL (ref 79–97)
Monocytes Absolute: 0.9 10*3/uL (ref 0.1–0.9)
Monocytes: 11 %
Neutrophils Absolute: 5 10*3/uL (ref 1.4–7.0)
Neutrophils: 62 %
Platelets: 217 10*3/uL (ref 150–450)
RBC: 3.5 x10E6/uL — ABNORMAL LOW (ref 3.77–5.28)
RDW: 13.4 % (ref 11.7–15.4)
WBC: 8.2 10*3/uL (ref 3.4–10.8)

## 2023-07-31 LAB — CMP14+EGFR
ALT: 20 IU/L (ref 0–32)
AST: 31 IU/L (ref 0–40)
Albumin: 4.5 g/dL (ref 3.8–4.8)
Alkaline Phosphatase: 109 IU/L (ref 44–121)
BUN/Creatinine Ratio: 12 (ref 12–28)
BUN: 18 mg/dL (ref 8–27)
Bilirubin Total: 0.3 mg/dL (ref 0.0–1.2)
CO2: 21 mmol/L (ref 20–29)
Calcium: 9.2 mg/dL (ref 8.7–10.3)
Chloride: 100 mmol/L (ref 96–106)
Creatinine, Ser: 1.49 mg/dL — ABNORMAL HIGH (ref 0.57–1.00)
Globulin, Total: 2.4 g/dL (ref 1.5–4.5)
Glucose: 100 mg/dL — ABNORMAL HIGH (ref 70–99)
Potassium: 4.9 mmol/L (ref 3.5–5.2)
Sodium: 139 mmol/L (ref 134–144)
Total Protein: 6.9 g/dL (ref 6.0–8.5)
eGFR: 37 mL/min/{1.73_m2} — ABNORMAL LOW (ref >59)

## 2023-07-31 LAB — VITAMIN D 25 HYDROXY (VIT D DEFICIENCY, FRACTURES): Vit D, 25-Hydroxy: 31.7 ng/mL (ref 30.0–100.0)

## 2023-07-31 NOTE — Telephone Encounter (Signed)
 Copied from CRM [id number]. Topic: General - Other >> Jul 31, 2023 10:12 AM [name] wrote: Reason for CRM: Patient called says she is going to church Friday inside.. she wants to make sure what she has on her arm is not contagious.. Pls call her

## 2023-07-31 NOTE — Telephone Encounter (Signed)
 Spoke to pt and advised that she is not contagious as the bandage is for a skin tear and pt voiced understanding.

## 2023-08-01 ENCOUNTER — Encounter: Payer: Self-pay | Admitting: Family Medicine

## 2023-08-01 ENCOUNTER — Telehealth: Payer: Self-pay

## 2023-08-01 NOTE — Telephone Encounter (Signed)
 Copied from CRM [id number]. Topic: Clinical - Medical Advice >> Aug 01, 2023  8:03 AM Georgeann [facility] wrote: Reason for CRM: Patient is requesting callback from Lugenia Said FNP or her CMA [name] regarding the wrap on her on. She would like a callback at [phone].

## 2023-08-01 NOTE — Telephone Encounter (Signed)
 I spoke to pt and she had questions about her tegaderm on her arm. Reassured pt and answered questions.

## 2023-08-04 LAB — TOXASSURE SELECT 13 (MW), URINE

## 2023-08-07 NOTE — Progress Notes (Signed)
 Remote pacemaker transmission.

## 2023-08-11 ENCOUNTER — Ambulatory Visit (INDEPENDENT_AMBULATORY_CARE_PROVIDER_SITE_OTHER): Payer: Medicare HMO | Admitting: Gastroenterology

## 2023-08-11 ENCOUNTER — Encounter (INDEPENDENT_AMBULATORY_CARE_PROVIDER_SITE_OTHER): Payer: Self-pay | Admitting: Gastroenterology

## 2023-08-11 VITALS — BP 120/66 | HR 60 | Temp 97.7°F | Ht 64.0 in | Wt 151.1 lb

## 2023-08-11 DIAGNOSIS — Z8719 Personal history of other diseases of the digestive system: Secondary | ICD-10-CM | POA: Diagnosis not present

## 2023-08-11 DIAGNOSIS — K7581 Nonalcoholic steatohepatitis (NASH): Secondary | ICD-10-CM

## 2023-08-11 DIAGNOSIS — K7460 Unspecified cirrhosis of liver: Secondary | ICD-10-CM

## 2023-08-11 MED ORDER — PANTOPRAZOLE SODIUM 40 MG PO TBEC: 40.0000 mg | DELAYED_RELEASE_TABLET | Freq: Every day | ORAL | 3 refills | Status: AC

## 2023-08-11 NOTE — Progress Notes (Signed)
 Referring Provider: Joesph Annabella HERO, FNP Primary Care Physician:  Joesph Annabella HERO, FNP Primary GI Physician: Dr. Eartha   Chief Complaint  Patient presents with   Follow-up    Pt arrives for follow up. Pt states she is doing ok other than left ear pain. Went to PCP and PCP stated nothing wrong with ear.    HPI:   Carol Richmond is a 73 y.o. female with past medical history of MASH cirrhosis with G1 EV, GERD, SVT, Afib/A flutter   Patient presenting today for:  Follow up of MASH cirrhosis   Last seen December 2024, at that time, noted intermittent burning to her tongue. Using magic mouthwash PRN. She notes this is expensive so she uses it when it becomes very bad. She has appt with ENT as referred by her PCP for further evaluation of her burning. She denies any Issues with eating or swallowing.  Having about 3 BMs per day.   Recommended to continue lactulose , continue lasix  40mg  daily, continue magic mouthwash PRN, proceed with ENT referral, MELD labs June 2025, AFP/US  in feb 2025  Present: Patient inquires if she can take ibuprofen. She inquired about taking this PCP and was told it was fine but wanted to discuss in regards to her liver. She states she has gained some weight back that she had lost. Her appetite is good. She denies swelling to her abdomen or legs. She denies confusion. She is taking her lactulose  TID. Having a BM now about 2-3 times per day. Stools are generally formed to softer. Previously having a BM everytime she ate. No nausea or vomiting. No rectal bleeding or melena.   She continues to decline any further endoscopic evaluations due to her extensive cardiac issues  She c/o Left ear/jaw pain. Has seen PCP without resolve. Notes some clicking when she opens her mouth but no issues with mandibular range of motion.   Last MELD 3.0: June 2025  20 (influenced by elevated INR in setting of coumadin ) Cirrhosis related questions: Hematemesis/coffee ground  emesis: No Abdominal pain: No Abdominal distention/worsening ascitesNo Fever/chills: No Number of Bms per day - 3-4 times, takes lactulose  3 times a day. Episodes of confusion/disorientation: No Taking diuretics?: yes, furosemide  40 mg qday History of variceal bleeding: No Prior history of banding?: No Prior episodes of SBP: No Last time liver imaging was performed: 04/2023 No acute abnormality identified. 2. Nodular contour with increased echotexture of the liver, consistent with cirrhosis. Last AFP:  04/2023 2.5  Currently consuming alcohol: No Hepatitis A and B vaccination status: Immune to hepatitis A, states she was vaccinated for hepatitis B in 2023     Last Colonoscopy: 05/2017  normal colonoscopy, recommend repeat in 5 years Last Endoscopy:- 03/07/21 Normal hypopharynx. - Normal proximal esophagus and mid esophagus. - Grade I esophageal varices. - Z-line irregular, 37 cm from the incisors. - Portal hypertensive gastropathy. - Food was found in the stomach. - Erosive gastropathy with no stigmata of recent bleeding(secondary to NSAID; gastric biopsy negative for H. pylori in August 2020) - Normal duodenal bulb and second portion of the duodenum. - No specimens collected.  Filed Weights   08/11/23 1516  Weight: 151 lb 1.6 oz (68.5 kg)     Past Medical History:  Diagnosis Date   Asthma    AVNRT (AV nodal re-entry tachycardia) (HCC)    Carotid artery disease (HCC)    Cirrhosis (HCC)    Diverticulosis of colon (without mention of hemorrhage)    Essential  hypertension    Functional ovarian cysts    GERD (gastroesophageal reflux disease)    Hemorrhoids, external, without mention of complication    Hormone replacement therapy (postmenopausal)    IBS (irritable bowel syndrome)    Migraine    Mitral valve disease    Pacemaker    Complete heart block - [employer]   Right knee DJD    Seizures (HCC)    Sleep apnea    TIA (transient ischemic attack)    Type 2 diabetes  mellitus [facility])     Past Surgical History:  Procedure Laterality Date   BIOPSY  09/23/2018   Procedure: BIOPSY;  Surgeon: Golda Claudis PENNER, MD;  [facility];  Service: Endoscopy;;  duodeneum and stomach   CARDIOVERSION N/A 08/14/2022   Procedure: CARDIOVERSION;  Surgeon: Alvan Ronal BRAVO, MD;  Location: [facility] INVASIVE CV LAB;  Service: Cardiovascular;  Laterality: N/A;   CHOLECYSTECTOMY  1980   ENDARTERECTOMY Right 07/01/2017   Procedure: ENDARTERECTOMY CAROTID RIGHT;  Surgeon: Serene Gaile ORN, MD;  Location: [facility] OR;  Service: Vascular;  Laterality: Right;   ESOPHAGOGASTRODUODENOSCOPY N/A 09/23/2018   Procedure: ESOPHAGOGASTRODUODENOSCOPY (EGD);  Surgeon: Golda Claudis PENNER, MD;  [facility]: [facility];  Service: Endoscopy;  Laterality: N/A;  2:45-moved to 1:45 per Jenkins   ESOPHAGOGASTRODUODENOSCOPY (EGD) WITH PROPOFOL  N/A 03/07/2021   Procedure: ESOPHAGOGASTRODUODENOSCOPY (EGD) WITH PROPOFOL ;  Surgeon: Golda Claudis PENNER, MD;  [facility];  Service: Endoscopy;  Laterality: N/A;  12:50   LEFT HEART CATHETERIZATION WITH CORONARY ANGIOGRAM N/A 11/18/2013   Procedure: LEFT HEART CATHETERIZATION WITH CORONARY ANGIOGRAM;  Surgeon: Lonni JONETTA Cash, MD;  Location: [facility] CATH LAB;  Service: Cardiovascular;  Laterality: N/A;   OOPHORECTOMY     2012 Dr. May in [place]   PACEMAKER IMPLANT N/A 12/14/2021   Procedure: PACEMAKER IMPLANT;  Surgeon: Waddell Danelle ORN, MD;  Location: [facility] INVASIVE CV LAB;  Service: Cardiovascular;  Laterality: N/A;   PATCH ANGIOPLASTY Right 07/01/2017   Procedure: RIGHT CAROTID PATCH ANGIOPLASTY;  Surgeon: Serene Gaile ORN, MD;  Location: [facility] OR;  Service: Vascular;  Laterality: Right;   SVT ABLATION N/A 12/14/2021   Procedure: SVT ABLATION;  Surgeon: Waddell Danelle ORN, MD;  Location: [facility] INVASIVE CV LAB;  Service: Cardiovascular;  Laterality: N/A;   TEE WITHOUT CARDIOVERSION N/A 03/01/2022   Procedure: TRANSESOPHAGEAL ECHOCARDIOGRAM (TEE);  Surgeon: Francyne Headland, MD;   Location: [facility] ENDOSCOPY;  Service: Cardiovascular;  Laterality: N/A;   TEE WITHOUT CARDIOVERSION N/A 08/14/2022   Procedure: TRANSESOPHAGEAL ECHOCARDIOGRAM;  Surgeon: Alvan Ronal BRAVO, MD;  Location: [facility] INVASIVE CV LAB;  Service: Cardiovascular;  Laterality: N/A;   TONSILLECTOMY     TOTAL KNEE ARTHROPLASTY Right 09/02/2012   Procedure: TOTAL KNEE ARTHROPLASTY- right;  Surgeon: Toribio JULIANNA Chancy, MD;  Location: [facility] OR;  Service: Orthopedics;  Laterality: Right;   TUBAL LIGATION     TYMPANOMASTOIDECTOMY Left 04/02/2016   Procedure: LEFT CANAL WALL DOWN TYMPANOMASTOIDECTOMY;  Surgeon: Daniel Moccasin, MD;  Location: [facility];  Service: ENT;  Laterality: Left;   VAGINAL HYSTERECTOMY  1990   WOUND EXPLORATION Right 07/02/2017   Procedure: RIGHT NECK EXPLORATION WITH INTRAOPERATIVE ULTRASOUND;  Surgeon: Eliza Lonni RAMAN, MD;  Location: [facility] OR;  Service: Vascular;  Laterality: Right;    Current Outpatient Medications  Medication Sig Dispense Refill   Accu-Chek Softclix Lancets lancets check blood sugars twice daily DX E11.65 200 each 3   acetaminophen  (TYLENOL ) 500 MG tablet Take 500 mg by mouth daily as needed for mild  pain.     amiodarone  (PACERONE ) 200 MG tablet Take 1 tablet (200 mg total) by mouth as directed. Take 1 tablet (200 mg total) Monday thru Friday, NONE on Saturday and Sunday 60 tablet 3   Azelastine HCl 0.15 % SOLN      Blood Pressure Monitoring (OMRON 3 SERIES BP MONITOR) DEVI Use as directed 1 each 0   capsaicin  (ZOSTRIX) 0.025 % cream Apply topically 2 (two) times daily. 60 g 0   citalopram  (CELEXA ) 20 MG tablet Take 1 tablet (20 mg total) by mouth daily. 90 tablet 3   fexofenadine  (ALLEGRA ) 180 MG tablet Take 1 tablet (180 mg total) by mouth daily. For allergy symptoms 30 tablet 11   fluticasone  (FLONASE ) 50 MCG/ACT nasal spray Place 2 sprays into both nostrils 2 (two) times daily.     furosemide  (LASIX ) 40 MG tablet take 1 tablet once daily. 90 tablet 3   gabapentin   (NEURONTIN ) 300 MG capsule Take 300 mg by mouth 3 (three) times daily as needed (neuropathy).     glucose blood (ACCU-CHEK GUIDE) test strip USE AS INSTRUCTED TO TEST BLOOD SUGAR DAILY AS DIRECTED. 50 each 11   lactulose  (CHRONULAC ) 10 GM/15ML solution take 30 mls (20 gram total) by mouth 3 times a day. 2700 mL 3   lamoTRIgine  (LAMICTAL ) 200 MG tablet take 1 tablet twice daily 180 tablet 3   LORazepam  (ATIVAN ) 0.5 MG tablet Take 1 tablet (0.5 mg total) by mouth 2 (two) times daily as needed for anxiety. 60 tablet 5   meloxicam  (MOBIC ) 15 MG tablet Take 1 tablet (15 mg total) by mouth daily. 30 tablet 0   metoprolol  tartrate (LOPRESSOR ) 25 MG tablet take 0.5 tablets (12.5 MILLIGRAM total) by mouth 2 (two) times daily. 90 tablet 1   montelukast  (SINGULAIR ) 10 MG tablet Take 1 tablet (10 mg total) by mouth at bedtime. 30 tablet 3   nystatin  (MYCOSTATIN ) 100000 UNIT/ML suspension Take 5 mLs (500,000 Units total) by mouth 4 (four) times daily. Swish and swallow 60 mL 0   ondansetron  (ZOFRAN ) 4 MG tablet take 1 tablet by mouth every 8 hours as needed for nausea and vomiting. 180 tablet 1   pantoprazole  (PROTONIX ) 40 MG tablet take 1 tablet (40 MILLIGRAM total) by mouth daily. 90 tablet 0   potassium chloride  SA (KLOR-CON  M) 20 MEQ tablet take 2 tablets by mouth daily. 180 tablet 3   sacubitril -valsartan  (ENTRESTO ) 24-26 MG Take 1 tablet by mouth 2 (two) times daily. 180 tablet 3   traMADol  (ULTRAM ) 50 MG tablet Take 50 mg by mouth every 6 (six) hours as needed.     traZODone  (DESYREL ) 50 MG tablet Take 1 tablet (50 mg total) by mouth at bedtime as needed for sleep. 90 tablet 3   warfarin (COUMADIN ) 5 MG tablet Take 1 tablet daily except 1/2 tablet on Tuesdays and Saturdays or as directed 30 tablet 5   No current facility-administered medications for this visit.    Allergies as of 08/11/2023   (No Known Allergies)    Social History   Socioeconomic History   Marital status: Widowed    Spouse  name: Not on file   Number of children: 3   Years of education: Not on file   Highest education level: Not on file  Occupational History   Occupation: retired  Tobacco Use   Smoking status: Never   Smokeless tobacco: Never  Vaping Use   Vaping status: Never Used  Substance and Sexual Activity   Alcohol  use: No    Alcohol/week: 0.0 standard drinks of alcohol   Drug use: No   Sexual activity: Not Currently  Other Topics Concern   Not on file  Social History Narrative   Lives alone. Granddaughter lives across the street, her 3 children all live nearby   Social Drivers of Health   Financial Resource Strain: Low Risk  (09/25/2022)   Overall Financial Resource Strain (CARDIA)    Difficulty of Paying Living Expenses: Not hard at all  Food Insecurity: No Food Insecurity (10/25/2022)   Hunger Vital Sign    Worried About Running Out of Food in the Last Year: Never true    Ran Out of Food in the Last Year: Never true  Transportation Needs: No Transportation Needs (10/25/2022)   PRAPARE - Administrator, Civil Service (Medical): No    Lack of Transportation (Non-Medical): No  Physical Activity: Insufficiently Active (09/25/2022)   Exercise Vital Sign    Days of Exercise per Week: 2 days    Minutes of Exercise per Session: 30 min  Stress: No Stress Concern Present (09/25/2022)   [employer] of Occupational Health - Occupational Stress Questionnaire    Feeling of Stress : Not at all  Social Connections: Socially Integrated (09/25/2022)   Social Connection and Isolation Panel    Frequency of Communication with Friends and Family: More than three times a week    Frequency of Social Gatherings with Friends and Family: More than three times a week    Attends Religious Services: More than 4 times per year    Active Member of [employer] or Organizations: Yes    Attends Engineer, structural: More than 4 times per year    Marital Status: Married    Review of systems General:  negative for malaise, night sweats, fever, chills, weight loss Neck: Negative for lumps, goiter, pain and significant neck swelling Resp: Negative for cough, wheezing, dyspnea at rest CV: Negative for chest pain, leg swelling, palpitations, orthopnea GI: denies melena, hematochezia, nausea, vomiting, diarrhea, constipation, dysphagia, odyonophagia, early satiety or unintentional weight loss.  MSK: Negative for joint swelling, back pain, and muscle pain. +left jaw pain  Derm: Negative for itching or rash Psych: Denies depression, anxiety, memory loss, confusion. No homicidal or suicidal ideation.  Heme: Negative for prolonged bleeding, bruising easily, and swollen nodes. Endocrine: Negative for cold or heat intolerance, polyuria, polydipsia and goiter. Neuro: negative for tremor, gait imbalance, syncope and seizures. The remainder of the review of systems is noncontributory.  Physical Exam: BP 120/66   Pulse 60   Temp 97.7 F (36.5 C)   Ht 5' 4 (1.626 m)   Wt 151 lb 1.6 oz (68.5 kg)   BMI 25.94 kg/m  General:   Alert and oriented. No distress noted. Pleasant and cooperative.  Head:  Normocephalic and atraumatic. Eyes:  Conjuctiva clear without scleral icterus. Mouth:  Oral mucosa pink and moist. Good dentition. No lesions. Popping noted to Left jaw upon opening of patient's mouth.  Heart: Normal rate and rhythm, s1 and s2 heart sounds present.  Lungs: Clear lung sounds in all lobes. Respirations equal and unlabored. Abdomen:  +BS, soft, non-tender and non-distended. No rebound or guarding. No HSM or masses noted. Derm: No palmar erythema or jaundice Msk:  Symmetrical without gross deformities. Normal posture. Extremities:  Without edema. Neurologic:  Alert and  oriented x4 Psych:  Alert and cooperative. Normal mood and affect.  Invalid input(s): 6 MONTHS   ASSESSMENT: Carol Lacaze  Richmond is a 73 y.o. female presenting today for follow up of MASH cirrhosis  Cirrhosis:  secondary to [facility], has been relatively well compensated. Last EGD in 2023 with G1 EVs, recommended repeat EGD in 1 year though patient has declined due to her extensive cardiac issues, she does not wish to undergo any further endoscopic evaluations. Dr. Eartha had also recommended discussion with cardiology of switching metoprolol  to NSBB given her varices, though Cardiology recommended she remain on lopressor  vs. Switching to Coreg  due to her BP. She denies any confusion, ascites or swelling to LEs. Having 2-3 BMs on lactulose  TID. She is up to date on HCC screening and MELD labs, MELD 3.0 is 20 though this is likely heavily influenced by elevated INR as she is on coumadin .   We discussed importance of avoiding NSAIDs to include ibuprofen especially in setting of liver disease and coumadin  as these can precipitate bleeding.   In regards to her Left ear/jaw pain, suspect this may be TMJ. recommend she follow up with a dentist for this.    PLAN:  -continue lasix  40mg  daily  -up to date on MELD labs until December -HCC screening via US  September 2024 -continue lactulose  30ml three times per day -repeat AFP tumor marker September  - Reduce salt intake to <2 g per day - Can take Tylenol  max of 2 g per day (650 mg q8h) for pain - Avoid NSAIDs for pain (discussed not to take ibuprofen given cirrhosis and coumadin ) - Avoid eating raw oysters/shellfish - Ensure every night before going to sleep  All questions were answered, patient verbalized understanding and is in agreement with plan as outlined above.    Follow Up: 6 months   Carol Basto L. Mariette, MSN, APRN, AGNP-C Adult-Gerontology Nurse Practitioner [facility]  I have reviewed the note and agree with the APP's assessment as described in this progress note  Toribio Eartha, MD Gastroenterology and Hepatology [facility]

## 2023-08-11 NOTE — Patient Instructions (Addendum)
-  continue lactulose  30mg  three times per day  - continue lasix  40mg  daily  -repeat US  September 2025 - Reduce salt intake to <2 g per day - Can take Tylenol  as needed for pain, max of 2000mg  per day (650 mg every 8 hours as needed) for pain - Avoid NSAIDs for pain (as discussed, please do not take ibuprofen) - Avoid eating raw oysters/shellfish - Ensure every night before going to sleep -follow up with dentist regarding ear/jaw pain  Follow up 6 months   It was a pleasure to see you today. I want to create trusting relationships with patients and provide genuine, compassionate, and quality care. I truly value your feedback! please be on the lookout for a survey regarding your visit with me today. I appreciate your input about our visit and your time in completing this!    Ossie Yebra L. Makih Stefanko, MSN, APRN, AGNP-C Adult-Gerontology Nurse Practitioner [facility]

## 2023-08-13 ENCOUNTER — Telehealth: Payer: Self-pay | Admitting: Family Medicine

## 2023-08-13 NOTE — Telephone Encounter (Signed)
 Copied from CRM [id number]. Topic: General - Call Back - No Documentation >> Aug 13, 2023 10:59 AM Wess RAMAN wrote: Patient is requesting a callback from Joesph, [place] assistant to discuss issues with her ear.  Callback #: [phone]

## 2023-08-13 NOTE — Telephone Encounter (Signed)
 I spoke to pt and she is complaining of her TMJ which has already been discussed in length at multiple visits. Reiterated instructions below from OV with PCP:  TMJ (temporomandibular joint syndrome) Continue mobic  prn. Discussed exercises, heat, ice, avoidance of crunchy foods, hard candy, chewing gum.    Pt voiced understanding.

## 2023-08-18 ENCOUNTER — Telehealth: Payer: Self-pay | Admitting: Internal Medicine

## 2023-08-18 ENCOUNTER — Ambulatory Visit: Payer: Self-pay

## 2023-08-18 NOTE — Telephone Encounter (Signed)
 Left message to return call

## 2023-08-18 NOTE — Telephone Encounter (Signed)
 Pt would like a c/b regarding left arm pain (when lifting arm up). Please advise

## 2023-08-18 NOTE — Telephone Encounter (Signed)
 FYI Only or Action Required?: FYI only for provider.  Patient was last seen in primary care on 07/30/2023 by Joesph Annabella HERO, FNP. Called Nurse Triage reporting Hand Pain. Symptoms began 2 weeks ago. Interventions attempted: Rest, hydration, or home remedies. Symptoms are: unchanged.  Triage Disposition: See PCP When Office is Open (Within 3 Days)  Patient/caregiver understands and will follow disposition?: Yes  Copied from CRM [id number]. Topic: Clinical - Red Word Triage >> Aug 18, 2023 10:33 AM Larissa RAMAN wrote: [facility] that prompted transfer to Nurse Triage: RT hand pain and swelling Reason for Disposition  [1] MODERATE pain (e.g., interferes with normal activities) AND [2] present > 3 days  Answer Assessment - Initial Assessment Questions 1. ONSET: When did the pain start?      Started possibly two weeks ago 2. LOCATION and RADIATION: Where is the pain located?  (e.g., fingertip, around nail, joint, entire  finger)      Right ring finger 3. SEVERITY: How bad is the pain? What does it keep you from doing?   (Scale 1-10; or mild, moderate, severe)  - MILD (1-3): doesn't interfere with normal activities.   - MODERATE (4-7): interferes with normal activities or awakens from sleep.  - SEVERE (8-10): excruciating pain, unable to hold a glass of water  or bend finger even a little.     Moderate to severe 4. APPEARANCE: What does the finger look like? (e.g., redness, swelling, bruising, pallor)     swelling 5. WORK OR EXERCISE: Has there been any recent work or exercise that involved this part (i.e., fingers or hand) of the body?     no 6. CAUSE: What do you think is causing the pain?     Unsure of what could be causing it 7. AGGRAVATING FACTORS: What makes the pain worse? (e.g., using computer)     nothing 8. OTHER SYMPTOMS: Do you have any other symptoms? (e.g., fever, neck pain, numbness)     no  Protocols used: Finger Pain-A-AH

## 2023-08-19 ENCOUNTER — Ambulatory Visit: Payer: Self-pay | Admitting: Family Medicine

## 2023-08-19 ENCOUNTER — Ambulatory Visit (INDEPENDENT_AMBULATORY_CARE_PROVIDER_SITE_OTHER): Admitting: Family Medicine

## 2023-08-19 ENCOUNTER — Ambulatory Visit (INDEPENDENT_AMBULATORY_CARE_PROVIDER_SITE_OTHER)

## 2023-08-19 ENCOUNTER — Encounter: Payer: Self-pay | Admitting: Family Medicine

## 2023-08-19 VITALS — BP 99/53 | HR 59 | Temp 98.0°F | Ht 64.0 in | Wt 152.0 lb

## 2023-08-19 DIAGNOSIS — M152 Bouchard's nodes (with arthropathy): Secondary | ICD-10-CM

## 2023-08-19 DIAGNOSIS — M79644 Pain in right finger(s): Secondary | ICD-10-CM | POA: Diagnosis not present

## 2023-08-19 DIAGNOSIS — M85841 Other specified disorders of bone density and structure, right hand: Secondary | ICD-10-CM | POA: Diagnosis not present

## 2023-08-19 NOTE — Progress Notes (Signed)
 Subjective:  [name], female    DOB: 22-Nov-1950  Age: 73 y.o. MRN: [id number]  CC: Hand Pain (Right hand/ ring finger. Started after putting on a ring 2 or 3 weeks ago. Still has ROM. )   HPI DOLORES EWING presents for 2 to 3 weeks of pain in the right fourth finger.  The patient states that she put on a ring on that finger and it seemed to pinch the underside of the finger so she left it on for a day before taking it off.  Since then she has had fairly consistent pain.  It is moderate but sometimes very uncomfortable.  She points to the area of the PIP down to the MCP of the right fourth finger.  She denies edema.  She says the ring did not get stuck.  It was kind hard to get is over the PIP knuckle.  There is no known injury.  It hurts to bend at the MCP and PIP joints.     07/30/2023    5:16 PM 07/07/2023   10:14 AM 07/04/2023    8:00 AM  Depression screen PHQ 2/9  Decreased Interest 0 0 0  Down, Depressed, Hopeless 1 0 0  PHQ - 2 Score 1 0 0  Altered sleeping 0 0 0  Tired, decreased energy 0 0 0  Change in appetite 0 0 0  Feeling bad or failure about yourself  0 0 0  Trouble concentrating 0 0 0  Moving slowly or fidgety/restless 0 0 0  Suicidal thoughts 0 0 0  PHQ-9 Score 1 0 0  Difficult doing work/chores Not difficult at all Not difficult at all Not difficult at all    History Yuna has a past medical history of Asthma, AVNRT (AV nodal re-entry tachycardia) (HCC), Carotid artery disease (HCC), Cirrhosis (HCC), Diverticulosis of colon (without mention of hemorrhage), Essential hypertension, Functional ovarian cysts, GERD (gastroesophageal reflux disease), Hemorrhoids, external, without mention of complication, Hormone replacement therapy (postmenopausal), IBS (irritable bowel syndrome), Migraine, Mitral valve disease, Pacemaker, Right knee DJD, Seizures (HCC), Sleep apnea, TIA (transient ischemic attack), and Type 2 diabetes mellitus (HCC).   She  has a past surgical history that includes Cholecystectomy (1980); Vaginal hysterectomy (1990); Tonsillectomy; Oophorectomy; Tubal ligation; Total knee arthroplasty (Right, 09/02/2012); left heart catheterization with coronary angiogram (N/A, 11/18/2013); tympanomastoidectomy (Left, 04/02/2016); Endarterectomy (Right, 07/01/2017); Patch angioplasty (Right, 07/01/2017); Wound exploration (Right, 07/02/2017); Esophagogastroduodenoscopy (N/A, 09/23/2018); biopsy (09/23/2018); Esophagogastroduodenoscopy (egd) with propofol  (N/A, 03/07/2021); SVT ABLATION (N/A, 12/14/2021); PACEMAKER IMPLANT (N/A, 12/14/2021); TEE without cardioversion (N/A, 03/01/2022); TEE without cardioversion (N/A, 08/14/2022); and Cardioversion (N/A, 08/14/2022).   Her family history includes Cancer in her sister; Diabetes in her mother; Heart disease in her brother, brother, brother, mother, and son.She reports that she has never smoked. She has never used smokeless tobacco. She reports that she does not drink alcohol and does not use drugs.    ROS Review of Systems  Objective:  BP (!) 99/53   Pulse (!) 59   Temp 98 F (36.7 C)   Ht 5' 4 (1.626 m)   Wt 152 lb (68.9 kg)   SpO2 96%   BMI 26.09 kg/m   BP Readings from Last 3 Encounters:  08/19/23 (!) 99/53  08/11/23 120/66  07/30/23 100/60    Wt Readings from Last 3 Encounters:  08/19/23 152 lb (68.9 kg)  08/11/23 151 lb 1.6 oz (68.5 kg)  07/30/23 148 lb 3.2 oz (67.2 kg)  Physical Exam Constitutional:      General: She is not in acute distress.    Appearance: She is well-developed.  Cardiovascular:     Rate and Rhythm: Normal rate and regular rhythm.  Pulmonary:     Breath sounds: Normal breath sounds.  Musculoskeletal:        General: Tenderness and deformity (There is tenderness and hypertrophic joint change at the PIP of the right fourth digit.  There is mild tenderness at the MCP.  The right fourth finger is tender for range of motion for flexion extension of both  the MCP and PIP joints.) present. Normal range of motion.  Skin:    General: Skin is warm and dry.  Neurological:     Mental Status: She is alert and oriented to person, place, and time.      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 Pt. Called back stating it was too painful. Therefore I Dced it.   Follow-up: Return if symptoms worsen or fail to improve.  Butler Der, M.D.

## 2023-08-20 ENCOUNTER — Telehealth: Payer: Self-pay

## 2023-08-20 NOTE — Telephone Encounter (Signed)
 Copied from CRM [id number]. Topic: Clinical - Medical Advice >> Aug 20, 2023  8:13 AM Diannia H wrote: Reason for CRM: Patient came in yesterday to see Dr. Zollie and he put on a splint and the patient said she can't wear it, its making her hand itch, its hurting her to wear it, and she is wanting to know what she needs to do, could you assist, patients callback number is [phone]. If its after 11 call her cell, if its before 11 call her home at [phone].

## 2023-08-20 NOTE — Telephone Encounter (Signed)
 Lm on vm on 08/20/23 LS

## 2023-08-20 NOTE — Telephone Encounter (Signed)
 The x-ray came back normal. She can discontinue the splint if desired.

## 2023-08-20 NOTE — Telephone Encounter (Signed)
 Copied from CRM [id number]. Topic: Clinical - Medical Advice >> Aug 20, 2023  8:13 AM Diannia H wrote: Reason for CRM: Patient came in yesterday to see Dr. Zollie and he put on a splint and the patient said she can't wear it, its making her hand itch, its hurting her to wear it, and she is wanting to know what she needs to do, could you assist, patients callback number is [phone]. If its after 11 call her cell, if its before 11 call her home at [phone]. >> Aug 20, 2023  9:25 AM [name] wrote: Please call patient.. returned our call about splint.. pls try both home and cell

## 2023-08-21 NOTE — Telephone Encounter (Signed)
Pt aware of provider feedback and voiced understanding. 

## 2023-08-25 NOTE — Telephone Encounter (Signed)
 Spoke with patient - stated she has already taken care of issue with her pcp.

## 2023-08-26 ENCOUNTER — Encounter: Payer: Self-pay | Admitting: Family Medicine

## 2023-08-27 ENCOUNTER — Telehealth: Payer: Self-pay

## 2023-08-27 NOTE — Telephone Encounter (Signed)
 I spoke to pt and she states it hurts when she lifts her arm and she thinks it is probably just arthritis but today it is better then it has been. Advised pt she would ntbs in office for evaluation and pt declined and states she will see if it is better tomorrow.

## 2023-08-27 NOTE — Telephone Encounter (Signed)
 Copied from CRM [id number]. Topic: Appointments - Appointment Scheduling >> Aug 27, 2023  9:57 AM [name] wrote: Left arm in pain .SABRA About a week.. when she lifts it.. maybe arthritis?  Would like a call versus coming in?

## 2023-08-28 ENCOUNTER — Ambulatory Visit: Payer: Self-pay

## 2023-08-28 NOTE — Telephone Encounter (Signed)
 Noted

## 2023-08-28 NOTE — Telephone Encounter (Signed)
 FYI Only or Action Required?: FYI only for provider.  Patient was last seen in primary care on 08/19/2023 by Zollie Lowers, MD.  Called Nurse Triage reporting Arm Pain.  Symptoms began 1-2 weeks.  Interventions attempted: OTC medications: Tylenol  and Bengay.  Symptoms are: left arm paingradually worsening.  Triage Disposition: See PCP When Office is Open (Within 3 Days)  Patient/caregiver understands and will follow disposition?: Yes                Copied from CRM [id number]. Topic: Clinical - Red Word Triage >> Aug 28, 2023  9:29 AM Vena H wrote: Red Word that prompted transfer to Nurse Triage: Pt is having left arm pain, pain level of 5 Reason for Disposition  [1] MODERATE pain (e.g., interferes with normal activities) AND [2] present > 3 days  Answer Assessment - Initial Assessment Questions 1. ONSET: When did the pain start?     X 1-2 weeks, gradually worsened.  2. LOCATION: Where is the pain located?     Entire left arm.  3. PAIN: How bad is the pain? (Scale 0-10; or none, mild, moderate, severe)     4/10.  4. WORK OR EXERCISE: Has there been any recent work or exercise that involved this part of the body?     No exercise, injury or heavy lifting.  5. CAUSE: What do you think is causing the arm pain?     Patient unsure if it is arthritis.  6. OTHER SYMPTOMS: Do you have any other symptoms? (e.g., neck pain, swelling, rash, fever, numbness, weakness)     Patient denies back pain, jaw pain, chest pain, SOB.  7. PREGNANCY: Is there any chance you are pregnant? When was your last menstrual period?     N/A.  Protocols used: Arm Pain-A-AH

## 2023-08-28 NOTE — Telephone Encounter (Signed)
 Pt disconnected call before NT transfer, attempted to call pt back, LVMTCB   Copied from CRM [id number]. Topic: Clinical - Red Word Triage >> Aug 28, 2023  9:29 AM Vena H wrote: Red Word that prompted transfer to Nurse Triage: Pt is having left arm pain, pain level of 5

## 2023-08-29 ENCOUNTER — Ambulatory Visit: Admitting: Family Medicine

## 2023-08-29 ENCOUNTER — Encounter: Payer: Self-pay | Admitting: Family Medicine

## 2023-08-29 ENCOUNTER — Ambulatory Visit: Payer: Self-pay

## 2023-08-29 VITALS — BP 121/74 | HR 72 | Temp 97.3°F | Ht 64.0 in | Wt 152.0 lb

## 2023-08-29 DIAGNOSIS — M26622 Arthralgia of left temporomandibular joint: Secondary | ICD-10-CM

## 2023-08-29 DIAGNOSIS — M25512 Pain in left shoulder: Secondary | ICD-10-CM

## 2023-08-29 MED ORDER — METHYLPREDNISOLONE ACETATE 40 MG/ML IJ SUSP
40.0000 mg | Freq: Once | INTRAMUSCULAR | Status: AC
  Administered 2023-08-29: 40 mg via INTRA_ARTICULAR

## 2023-08-29 NOTE — Progress Notes (Signed)
 Subjective:  [name], female    DOB: Oct 27, 1950, 73 y.o.   MRN: [id number]  Patient Care Team: Joesph Annabella HERO, FNP as PCP - General (Family Medicine) Debera Jayson MATSU, MD as PCP - Cardiology (Cardiology) Waddell Danelle ORN, MD as PCP - Electrophysiology (Cardiology) Georjean Darice HERO, MD as Consulting Physician (Neurology) Golda Claudis PENNER, MD (Inactive) as Consulting Physician (Gastroenterology) Dr Willma Moats Optometrist, [place], OD (Optometry) Eliza Lonni RAMAN, MD (Inactive) as Consulting Physician (Vascular Surgery) Billee Mliss BIRCH, [facility] as Pharmacist (Family Medicine) Miriam Norris, NP as Nurse Practitioner (Cardiology)   Chief Complaint:  Shoulder Pain (Left shoulder feels stiff, couldn't lift arm very high at all. Today shoulder is feeling somewhat better. Tylenol  helps some, topical ointment does not help at all. Patient does not remember injuring her arm in anyway.)   HPI: Carol Richmond is a 73 y.o. female presenting on 08/29/2023 for Shoulder Pain (Left shoulder feels stiff, couldn't lift arm very high at all. Today shoulder is feeling somewhat better. Tylenol  helps some, topical ointment does not help at all. Patient does not remember injuring her arm in anyway.)   The patient presents with left shoulder pain radiating down the arm.  They have been experiencing left shoulder pain radiating down the arm for the past one to two weeks without recollection of a specific injury, although they did lift a heavy case of water  recently. Pain management at home has included Tylenol , Aspercreme, and Bengay, with some relief noted from Aspercreme. They have not tried [employer].  They have a history of cirrhosis. They are currently on Coumadin . They have had joint injections in the past, but it has been a long time since the last one.  Their blood sugar levels have been stable, with a recent reading of 5.4, and they no longer need to take  diabetes medication.  Additionally, they experience clicking in the TMJ joint on the left side.       Relevant past medical, surgical, family, and social history reviewed and updated as indicated.  Allergies and medications reviewed and updated. Data reviewed: Chart in Epic.   Past Medical History:  Diagnosis Date   Asthma    AVNRT (AV nodal re-entry tachycardia) (HCC)    Carotid artery disease (HCC)    Cirrhosis (HCC)    Diverticulosis of colon (without mention of hemorrhage)    Essential hypertension    Functional ovarian cysts    GERD (gastroesophageal reflux disease)    Hemorrhoids, external, without mention of complication    Hormone replacement therapy (postmenopausal)    IBS (irritable bowel syndrome)    Migraine    Mitral valve disease    Pacemaker    Complete heart block - [employer]tronic   Right knee DJD    Seizures (HCC)    Sleep apnea    TIA (transient ischemic attack)    Type 2 diabetes mellitus (HCC)     Past Surgical History:  Procedure Laterality Date   BIOPSY  09/23/2018   Procedure: BIOPSY;  Surgeon: Golda Claudis PENNER, MD;  Location: [facility];  Service: Endoscopy;;  duodeneum and stomach   CARDIOVERSION N/A 08/14/2022   Procedure: CARDIOVERSION;  Surgeon: Alvan Ronal BRAVO, MD;  Location: [facility] INVASIVE CV LAB;  Service: Cardiovascular;  Laterality: N/A;   CHOLECYSTECTOMY  1980   ENDARTERECTOMY Right 07/01/2017   Procedure: ENDARTERECTOMY CAROTID RIGHT;  Surgeon: Serene Gaile ORN, MD;  Location: [facility] OR;  Service: Vascular;  Laterality: Right;   ESOPHAGOGASTRODUODENOSCOPY N/A 09/23/2018   Procedure: ESOPHAGOGASTRODUODENOSCOPY (EGD);  Surgeon: Golda Claudis PENNER, MD;  Location: [facility];  Service: Endoscopy;  Laterality: N/A;  2:45-moved to 1:45 per Jenkins   ESOPHAGOGASTRODUODENOSCOPY (EGD) WITH PROPOFOL  N/A 03/07/2021   Procedure: ESOPHAGOGASTRODUODENOSCOPY (EGD) WITH PROPOFOL ;  Surgeon: Golda Claudis PENNER, MD;  Location: [facility];  Service: Endoscopy;  Laterality:  N/A;  12:50   LEFT HEART CATHETERIZATION WITH CORONARY ANGIOGRAM N/A 11/18/2013   Procedure: LEFT HEART CATHETERIZATION WITH CORONARY ANGIOGRAM;  Surgeon: Lonni JONETTA Cash, MD;  Location: [facility] CATH LAB;  Service: Cardiovascular;  Laterality: N/A;   OOPHORECTOMY     2012 Dr. May in [place]   PACEMAKER IMPLANT N/A 12/14/2021   Procedure: PACEMAKER IMPLANT;  Surgeon: Waddell Danelle ORN, MD;  Location: [facility] INVASIVE CV LAB;  Service: Cardiovascular;  Laterality: N/A;   PATCH ANGIOPLASTY Right 07/01/2017   Procedure: RIGHT CAROTID PATCH ANGIOPLASTY;  Surgeon: Serene Gaile ORN, MD;  Location: [facility] OR;  Service: Vascular;  Laterality: Right;   SVT ABLATION N/A 12/14/2021   Procedure: SVT ABLATION;  Surgeon: Waddell Danelle ORN, MD;  Location: [facility] INVASIVE CV LAB;  Service: Cardiovascular;  Laterality: N/A;   TEE WITHOUT CARDIOVERSION N/A 03/01/2022   Procedure: TRANSESOPHAGEAL ECHOCARDIOGRAM (TEE);  Surgeon: Francyne Headland, MD;  Location: [facility] ENDOSCOPY;  Service: Cardiovascular;  Laterality: N/A;   TEE WITHOUT CARDIOVERSION N/A 08/14/2022   Procedure: TRANSESOPHAGEAL ECHOCARDIOGRAM;  Surgeon: Alvan Ronal BRAVO, MD;  Location: [facility] INVASIVE CV LAB;  Service: Cardiovascular;  Laterality: N/A;   TONSILLECTOMY     TOTAL KNEE ARTHROPLASTY Right 09/02/2012   Procedure: TOTAL KNEE ARTHROPLASTY- right;  Surgeon: Toribio JULIANNA Chancy, MD;  Location: [facility] OR;  Service: Orthopedics;  Laterality: Right;   TUBAL LIGATION     TYMPANOMASTOIDECTOMY Left 04/02/2016   Procedure: LEFT CANAL WALL DOWN TYMPANOMASTOIDECTOMY;  Surgeon: Daniel Moccasin, MD;  Location: [facility];  Service: ENT;  Laterality: Left;   VAGINAL HYSTERECTOMY  1990   WOUND EXPLORATION Right 07/02/2017   Procedure: RIGHT NECK EXPLORATION WITH INTRAOPERATIVE ULTRASOUND;  Surgeon: Eliza Lonni RAMAN, MD;  Location: [facility] OR;  Service: Vascular;  Laterality: Right;    Social History   Socioeconomic History   Marital status: Widowed    Spouse name: Not on file    Number of children: 3   Years of education: Not on file   Highest education level: Not on file  Occupational History   Occupation: retired  Tobacco Use   Smoking status: Never   Smokeless tobacco: Never  Vaping Use   Vaping status: Never Used  Substance and Sexual Activity   Alcohol use: No    Alcohol/week: 0.0 standard drinks of alcohol   Drug use: No   Sexual activity: Not Currently  Other Topics Concern   Not on file  Social History Narrative   Lives alone. Granddaughter lives across the street, her 3 children all live nearby   Social Drivers of Health   Financial Resource Strain: Low Risk  (09/25/2022)   Overall Financial Resource Strain (CARDIA)    Difficulty of Paying Living Expenses: Not hard at all  Food Insecurity: No Food Insecurity (10/25/2022)   Hunger Vital Sign    Worried About Running Out of Food in the Last Year: Never true    Ran Out of Food in the Last Year: Never true  Transportation Needs: No Transportation Needs (10/25/2022)   PRAPARE - Administrator, Civil Service (Medical): No  Lack of Transportation (Non-Medical): No  Physical Activity: Insufficiently Active (09/25/2022)   Exercise Vital Sign    Days of Exercise per Week: 2 days    Minutes of Exercise per Session: 30 min  Stress: No Stress Concern Present (09/25/2022)   [employer] of Occupational Health - Occupational Stress Questionnaire    Feeling of Stress : Not at all  Social Connections: Socially Integrated (09/25/2022)   Social Connection and Isolation Panel    Frequency of Communication with Friends and Family: More than three times a week    Frequency of Social Gatherings with Friends and Family: More than three times a week    Attends Religious Services: More than 4 times per year    Active Member of [employer] or Organizations: Yes    Attends Engineer, structural: More than 4 times per year    Marital Status: Married  Catering manager Violence: Not At Risk (10/17/2022)    Humiliation, Afraid, Rape, and Kick questionnaire    Fear of Current or Ex-Partner: No    Emotionally Abused: No    Physically Abused: No    Sexually Abused: No    Outpatient Encounter Medications as of 08/29/2023  Medication Sig   Accu-Chek Softclix Lancets lancets check blood sugars twice daily DX E11.65   acetaminophen  (TYLENOL ) 500 MG tablet Take 500 mg by mouth daily as needed for mild pain.   amiodarone  (PACERONE ) 200 MG tablet Take 1 tablet (200 mg total) by mouth as directed. Take 1 tablet (200 mg total) Monday thru Friday, NONE on Saturday and Sunday   Azelastine HCl 0.15 % SOLN    Blood Pressure Monitoring (OMRON 3 SERIES BP MONITOR) DEVI Use as directed   capsaicin  (ZOSTRIX) 0.025 % cream Apply topically 2 (two) times daily.   citalopram  (CELEXA ) 20 MG tablet Take 1 tablet (20 mg total) by mouth daily.   fexofenadine  (ALLEGRA ) 180 MG tablet Take 1 tablet (180 mg total) by mouth daily. For allergy symptoms   fluticasone  (FLONASE ) 50 MCG/ACT nasal spray Place 2 sprays into both nostrils 2 (two) times daily.   furosemide  (LASIX ) 40 MG tablet take 1 tablet once daily.   gabapentin  (NEURONTIN ) 300 MG capsule Take 300 mg by mouth 3 (three) times daily as needed (neuropathy).   glucose blood (ACCU-CHEK GUIDE) test strip USE AS INSTRUCTED TO TEST BLOOD SUGAR DAILY AS DIRECTED.   lactulose  (CHRONULAC ) 10 GM/15ML solution take 30 mls (20 gram total) by mouth 3 times a day.   lamoTRIgine  (LAMICTAL ) 200 MG tablet take 1 tablet twice daily   LORazepam  (ATIVAN ) 0.5 MG tablet Take 1 tablet (0.5 mg total) by mouth 2 (two) times daily as needed for anxiety.   meloxicam  (MOBIC ) 15 MG tablet Take 1 tablet (15 mg total) by mouth daily.   metoprolol  tartrate (LOPRESSOR ) 25 MG tablet take 0.5 tablets (12.5 MILLIGRAM total) by mouth 2 (two) times daily.   montelukast  (SINGULAIR ) 10 MG tablet Take 1 tablet (10 mg total) by mouth at bedtime.   nystatin  (MYCOSTATIN ) 100000 UNIT/ML suspension Take 5 mLs  (500,000 Units total) by mouth 4 (four) times daily. Swish and swallow   ondansetron  (ZOFRAN ) 4 MG tablet take 1 tablet by mouth every 8 hours as needed for nausea and vomiting.   pantoprazole  (PROTONIX ) 40 MG tablet Take 1 tablet (40 mg total) by mouth daily.   potassium chloride  SA (KLOR-CON  M) 20 MEQ tablet take 2 tablets by mouth daily.   sacubitril -valsartan  (ENTRESTO ) 24-26 MG Take 1 tablet by  mouth 2 (two) times daily.   traMADol  (ULTRAM ) 50 MG tablet Take 50 mg by mouth every 6 (six) hours as needed.   traZODone  (DESYREL ) 50 MG tablet Take 1 tablet (50 mg total) by mouth at bedtime as needed for sleep.   warfarin (COUMADIN ) 5 MG tablet Take 1 tablet daily except 1/2 tablet on Tuesdays and Saturdays or as directed   No facility-administered encounter medications on file as of 08/29/2023.    No Known Allergies  Pertinent ROS per HPI, otherwise unremarkable      Objective:  BP 121/74   Pulse 72   Temp (!) 97.3 F (36.3 C)   Ht 5' 4 (1.626 m)   Wt 152 lb (68.9 kg)   SpO2 96%   BMI 26.09 kg/m    Wt Readings from Last 3 Encounters:  08/29/23 152 lb (68.9 kg)  08/19/23 152 lb (68.9 kg)  08/11/23 151 lb 1.6 oz (68.5 kg)    Physical Exam Vitals and nursing note reviewed.  Constitutional:      Appearance: Normal appearance.  HENT:     Head: Normocephalic and atraumatic.     Jaw: Tenderness (left TMJ) present. No trismus, swelling or malocclusion.      Nose: Nose normal.     Mouth/Throat:     Lips: Pink.     Mouth: Mucous membranes are moist.  Eyes:     Conjunctiva/sclera: Conjunctivae normal.     Pupils: Pupils are equal, round, and reactive to light.  Cardiovascular:     Rate and Rhythm: Normal rate and regular rhythm.     Pulses: Normal pulses.     Heart sounds: Murmur heard.     Systolic murmur is present with a grade of 2/6.  Pulmonary:     Effort: Pulmonary effort is normal.     Breath sounds: Normal breath sounds.  Musculoskeletal:     Left shoulder:  Tenderness present. No swelling, deformity, effusion, laceration, bony tenderness or crepitus. Decreased range of motion. Normal strength. Normal pulse.     Left upper arm: Normal.     Cervical back: Normal.  Skin:    General: Skin is dry.     Capillary Refill: Capillary refill takes less than 2 seconds.  Neurological:     General: No focal deficit present.     Mental Status: She is alert. Mental status is at baseline.     Gait: Gait abnormal (using cane).  Psychiatric:        Attention and Perception: Attention normal.        Mood and Affect: Mood normal.        Speech: Speech normal.        Behavior: Behavior normal.        Cognition and Memory: Cognition is impaired. Memory is impaired.        Judgment: Judgment normal.    Joint Injection/Arthrocentesis  Date/Time: 08/29/2023 3:43 PM  Performed by: Severa Rock HERO, FNP Authorized by: Severa Rock HERO, FNP  Indications: pain and joint swelling  Body area: shoulder Joint: left shoulder Local anesthesia used: yes  Anesthesia: Local anesthesia used: yes Local Anesthetic: co-phenylcaine spray  Sedation: Patient sedated: no  Preparation: Patient was prepped and draped in the usual sterile fashion. Needle size: 22 G Ultrasound guidance: no Approach: posterior Aspirate amount: 0 mL Methylprednisolone  amount: 40 mg Lidocaine  2% amount: 3 mL Patient tolerance: patient tolerated the procedure well with no immediate complications      Results for orders placed or performed  in visit on 07/30/23  WET PREP FOR TRICH, YEAST, CLUE   Collection Time: 07/30/23 10:49 AM   Specimen: Vaginal Swab   Vaginal Swab  Result Value Ref Range   Trichomonas Exam Negative Negative   Yeast Exam Negative Negative   Clue Cell Exam Comment: Negative  Bayer DCA Hb A1c Waived   Collection Time: 07/30/23 10:52 AM  Result Value Ref Range   HB A1C (BAYER DCA - WAIVED) 5.4 4.8 - 5.6 %  ToxASSURE Select 13 (MW), Urine   Collection Time: 07/30/23  10:56 AM  Result Value Ref Range   Summary FINAL   CBC with Differential/Platelet   Collection Time: 07/30/23 10:57 AM  Result Value Ref Range   WBC 8.2 3.4 - 10.8 x10E3/uL   RBC 3.50 (L) 3.77 - 5.28 x10E6/uL   Hemoglobin 11.0 (L) 11.1 - 15.9 g/dL   Hematocrit 66.8 (L) 65.9 - 46.6 %   MCV 95 79 - 97 fL   MCH 31.4 26.6 - 33.0 pg   MCHC 33.2 31.5 - 35.7 g/dL   RDW 86.5 88.2 - 84.5 %   Platelets 217 150 - 450 x10E3/uL   Neutrophils 62 Not Estab. %   Lymphs 26 Not Estab. %   Monocytes 11 Not Estab. %   Eos 1 Not Estab. %   Basos 0 Not Estab. %   Neutrophils Absolute 5.0 1.4 - 7.0 x10E3/uL   Lymphocytes Absolute 2.2 0.7 - 3.1 x10E3/uL   Monocytes Absolute 0.9 0.1 - 0.9 x10E3/uL   EOS (ABSOLUTE) 0.1 0.0 - 0.4 x10E3/uL   Basophils Absolute 0.0 0.0 - 0.2 x10E3/uL   Immature Granulocytes 0 Not Estab. %   Immature Grans (Abs) 0.0 0.0 - 0.1 x10E3/uL  CMP14+EGFR   Collection Time: 07/30/23 10:57 AM  Result Value Ref Range   Glucose 100 (H) 70 - 99 mg/dL   BUN 18 8 - 27 mg/dL   Creatinine, Ser 8.50 (H) 0.57 - 1.00 mg/dL   eGFR 37 (L) >40 fO/fpw/8.26   BUN/Creatinine Ratio 12 12 - 28   Sodium 139 134 - 144 mmol/L   Potassium 4.9 3.5 - 5.2 mmol/L   Chloride 100 96 - 106 mmol/L   CO2 21 20 - 29 mmol/L   Calcium  9.2 8.7 - 10.3 mg/dL   Total Protein 6.9 6.0 - 8.5 g/dL   Albumin  4.5 3.8 - 4.8 g/dL   Globulin, Total 2.4 1.5 - 4.5 g/dL   Bilirubin Total 0.3 0.0 - 1.2 mg/dL   Alkaline Phosphatase 109 44 - 121 IU/L   AST 31 0 - 40 IU/L   ALT 20 0 - 32 IU/L  VITAMIN D  25 Hydroxy (Vit-D Deficiency, Fractures)   Collection Time: 07/30/23 10:57 AM  Result Value Ref Range   Vit D, 25-Hydroxy 31.7 30.0 - 100.0 ng/mL   *Note: Due to a large number of results and/or encounters for the requested time period, some results have not been displayed. A complete set of results can be found in Results Review.       Pertinent labs & imaging results that were available during my care of the patient  were reviewed by me and considered in my medical decision making.  Assessment & Plan:  Manda was seen today for shoulder pain.  Diagnoses and all orders for this visit:  Acute pain of left shoulder -     Joint Injection/Arthrocentesis  Arthralgia of left temporomandibular joint Discussed symptom management.      Left shoulder pain Left shoulder pain  radiating down the arm for one to two weeks, possibly related to lifting a heavy case of water . Limited relief with Tylenol , Aspercreme, and Bengay. On Coumadin , increasing risk of bleeding with joint injections. Interested in shoulder injection for pain relief, aware of increased bleeding risk due to Coumadin . - Administer shoulder injection with steroid and lidocaine  - Monitor for bleeding or hematoma for 10 minutes post-injection due to Coumadin  use  Temporomandibular joint disorder (TMJ) Clicking in the left TMJ. Advised to use a mouth guard at night to alleviate symptoms. - Advise use of a mouth guard at night          Continue all other maintenance medications.  Follow up plan: Return if symptoms worsen or fail to improve.   Continue healthy lifestyle choices, including diet (rich in fruits, vegetables, and lean proteins, and low in salt and simple carbohydrates) and exercise (at least 30 minutes of moderate physical activity daily).  Educational handout given for joint injection   The above assessment and management plan was discussed with the patient. The patient verbalized understanding of and has agreed to the management plan. Patient is aware to call the clinic if they develop any new symptoms or if symptoms persist or worsen. Patient is aware when to return to the clinic for a follow-up visit. Patient educated on when it is appropriate to go to the emergency department.   Rosaline Bruns, FNP-C [facility] [phone]

## 2023-08-29 NOTE — Telephone Encounter (Signed)
 FYI Only or Action Required?: FYI only for provider.  Patient was last seen in primary care on 08/19/2023 by Zollie Lowers, MD.  Called Nurse Triage reporting Arm Pain.  Symptoms began several weeks ago.  Interventions attempted: OTC medications: tylenol .  Symptoms are: completely resolved.  Triage Disposition: See PCP Within 2 Weeks  Patient/caregiver understands and will follow disposition?: Yes   Pt was calling due to pain better today and was wondering should she keep her appt.           Copied from CRM [id number]. Topic: Clinical - Red Word Triage >> Aug 29, 2023  2:01 PM Powell HERO wrote: Red Word that prompted transfer to Nurse Triage: Pain in her left arm, appointment today but worried she may need to be seen sooner. Reason for Disposition  [1] MILD pain (e.g., does not interfere with normal activities) AND [2] present > 7 days  Answer Assessment - Initial Assessment Questions 1. ONSET: When did the pain start?     2 weeks 2. LOCATION: Where is the pain located?     Left arm 3. PAIN: How bad is the pain? (Scale 0-10; or none, mild, moderate, severe)     No pain today 4. WORK OR EXERCISE: Has there been any recent work or exercise that involved this part of the body?     no 5. CAUSE: What do you think is causing the arm pain?     unknown 6. OTHER SYMPTOMS: Do you have any other symptoms? (e.g., neck pain, swelling, rash, fever, numbness, weakness)     no  Protocols used: Arm Pain-A-AH

## 2023-08-29 NOTE — Telephone Encounter (Signed)
 Pt was seen in office today with Rosaline Bruns and this was addressed.

## 2023-08-29 NOTE — Addendum Note (Signed)
 Addended by: SHERRE SUZEN PARAS on: 08/29/2023 04:30 PM   Modules accepted: Orders

## 2023-09-06 ENCOUNTER — Other Ambulatory Visit: Payer: Self-pay | Admitting: Cardiology

## 2023-09-08 ENCOUNTER — Other Ambulatory Visit: Payer: Self-pay | Admitting: Cardiology

## 2023-09-09 ENCOUNTER — Ambulatory Visit: Attending: Cardiology | Admitting: *Deleted

## 2023-09-09 DIAGNOSIS — Z5181 Encounter for therapeutic drug level monitoring: Secondary | ICD-10-CM

## 2023-09-09 DIAGNOSIS — I4891 Unspecified atrial fibrillation: Secondary | ICD-10-CM

## 2023-09-09 LAB — POCT INR: INR: 2.2 (ref 2.0–3.0)

## 2023-09-09 NOTE — Progress Notes (Signed)
Please see anticoagulation encounter.

## 2023-09-09 NOTE — Patient Instructions (Signed)
 Continue warfarin 1 tablet daily except 1/2 tablet on Tuesdays and Saturdays Recheck in 6 wks

## 2023-09-12 ENCOUNTER — Telehealth: Payer: Self-pay | Admitting: Family Medicine

## 2023-09-12 NOTE — Telephone Encounter (Signed)
 Copied from CRM [id number]. Topic: Appointments - Scheduling Inquiry for Clinic >> Sep 12, 2023 10:34 AM Carlatta H wrote: Reason for CRM: Per patient chart she received a call for appointment//Please call back if needed

## 2023-09-15 ENCOUNTER — Ambulatory Visit (INDEPENDENT_AMBULATORY_CARE_PROVIDER_SITE_OTHER): Payer: Medicare HMO

## 2023-09-15 ENCOUNTER — Other Ambulatory Visit (INDEPENDENT_AMBULATORY_CARE_PROVIDER_SITE_OTHER): Payer: Self-pay | Admitting: Gastroenterology

## 2023-09-15 DIAGNOSIS — I442 Atrioventricular block, complete: Secondary | ICD-10-CM | POA: Diagnosis not present

## 2023-09-15 DIAGNOSIS — M5030 Other cervical disc degeneration, unspecified cervical region: Secondary | ICD-10-CM | POA: Diagnosis not present

## 2023-09-15 DIAGNOSIS — M47812 Spondylosis without myelopathy or radiculopathy, cervical region: Secondary | ICD-10-CM | POA: Diagnosis not present

## 2023-09-15 DIAGNOSIS — M26629 Arthralgia of temporomandibular joint, unspecified side: Secondary | ICD-10-CM | POA: Diagnosis not present

## 2023-09-15 DIAGNOSIS — H9202 Otalgia, left ear: Secondary | ICD-10-CM | POA: Diagnosis not present

## 2023-09-16 ENCOUNTER — Telehealth: Payer: Self-pay

## 2023-09-16 DIAGNOSIS — S0300XS Dislocation of jaw, unspecified side, sequela: Secondary | ICD-10-CM

## 2023-09-16 LAB — CUP PACEART REMOTE DEVICE CHECK
Battery Remaining Longevity: 151 mo
Battery Voltage: 3.05 V
Brady Statistic AP VP Percent: 1.25 %
Brady Statistic AP VS Percent: 64.37 %
Brady Statistic AS VP Percent: 0.02 %
Brady Statistic AS VS Percent: 34.36 %
Brady Statistic RA Percent Paced: 65.66 %
Brady Statistic RV Percent Paced: 1.27 %
Date Time Interrogation Session: 20250728043738
Implantable Lead Connection Status: 753985
Implantable Lead Connection Status: 753985
Implantable Lead Implant Date: 20231027
Implantable Lead Implant Date: 20231027
Implantable Lead Location: 753859
Implantable Lead Location: 753860
Implantable Lead Model: 3830
Implantable Lead Model: 5076
Implantable Pulse Generator Implant Date: 20231027
Lead Channel Impedance Value: 285 Ohm
Lead Channel Impedance Value: 342 Ohm
Lead Channel Impedance Value: 418 Ohm
Lead Channel Impedance Value: 551 Ohm
Lead Channel Pacing Threshold Amplitude: 0.625 V
Lead Channel Pacing Threshold Amplitude: 0.625 V
Lead Channel Pacing Threshold Pulse Width: 0.4 ms
Lead Channel Pacing Threshold Pulse Width: 0.4 ms
Lead Channel Sensing Intrinsic Amplitude: 13.125 mV
Lead Channel Sensing Intrinsic Amplitude: 13.125 mV
Lead Channel Sensing Intrinsic Amplitude: 2.375 mV
Lead Channel Sensing Intrinsic Amplitude: 2.375 mV
Lead Channel Setting Pacing Amplitude: 2 V
Lead Channel Setting Pacing Amplitude: 2.5 V
Lead Channel Setting Pacing Pulse Width: 0.4 ms
Lead Channel Setting Sensing Sensitivity: 1.2 mV
Zone Setting Status: 755011

## 2023-09-16 NOTE — Telephone Encounter (Signed)
 Copied from CRM [id number]. Topic: Referral - Question >> Sep 16, 2023  8:28 AM Myrick T wrote: Reason for CRM: patient called stated she was told she has TMJ at [facility] who sent a request for a referral. She wanted a visit with her provider today but since she could not get in she wanted her nurse to call her back. Please f/u with patient

## 2023-09-16 NOTE — Telephone Encounter (Signed)
 Pt complains of TMJ pain and is very embarrassed when she is eating. Pt would like something to help with pain and to get rid of the TMJ.   States that while she was at her [facility] appt that she discussed sx's with them and they put in a referral to ENT on [address] in [place]. Pt received a call from them and was told that they could not treat TMJ. Pt has actually seen them in the past and did not like them and does not want to go back.  Informed pt that Carol Richmond is out of the office today and will return tomorrow. Will get advised on something to help with pain and question a new referral to get rid of the TMJ. Pt understood.

## 2023-09-17 NOTE — Telephone Encounter (Signed)
Referral to oral surgeon placed

## 2023-09-17 NOTE — Addendum Note (Signed)
 Addended by: JOESPH ANNABELLA HERO on: 09/17/2023 08:26 AM   Modules accepted: Orders

## 2023-09-17 NOTE — Telephone Encounter (Signed)
Pt aware referral placed

## 2023-09-19 ENCOUNTER — Ambulatory Visit: Payer: Self-pay | Admitting: Internal Medicine

## 2023-09-23 ENCOUNTER — Ambulatory Visit: Payer: Self-pay

## 2023-09-23 DIAGNOSIS — M26609 Unspecified temporomandibular joint disorder, unspecified side: Secondary | ICD-10-CM

## 2023-09-23 NOTE — Telephone Encounter (Signed)
 FYI Only or Action Required?: Action required by provider: medication refill request. Pt is wondering if a muscle relaxer will help with pain. Also pt needs more information about what TMJ is.   Patient was last seen in primary care on 08/29/2023 by Severa Rock HERO, FNP.  Called Nurse Triage reporting Medication Refill.  Symptoms began ongoing.  Interventions attempted: Other: See in office,  seen at ENT.  Symptoms are: unchanged.  Triage Disposition: See PCP When Office is Open (Within 3 Days)  Patient/caregiver understands and will follow disposition?: Yes                       Copied from CRM #[id number]. Topic: Clinical - Red Word Triage >> Sep 23, 2023  3:48 PM Donna BRAVO wrote: Red Word that prompted transfer to Nurse Triage: patient would like to speak with nurse about TMJ. Pain is all over the left side of head and neck, pain is behind the ear. Reason for Disposition  Prescription request for new medicine (not a refill)  Answer Assessment - Initial Assessment Questions 1. DRUG NAME: What medicine do you need to have refilled?     Muscle relaxer 2. REFILLS REMAINING: How many refills are remaining? Notes: The label on the medicine or pill bottle will show how many refills are remaining. If there are no refills remaining, then a renewal may be needed.  6. SYMPTOMS: Do you have any symptoms?     Jaw Pain - TMJ?  Protocols used: Medication Refill and Renewal Call-A-AH

## 2023-09-24 ENCOUNTER — Telehealth: Payer: Self-pay | Admitting: Family Medicine

## 2023-09-24 MED ORDER — TIZANIDINE HCL 4 MG PO TABS: 4.0000 mg | ORAL_TABLET | Freq: Three times a day (TID) | ORAL | 1 refills | Status: DC | PRN

## 2023-09-24 NOTE — Telephone Encounter (Signed)
 Copied from CRM [id number]. Topic: Clinical - Medication Question >> Sep 24, 2023  9:48 AM [name] wrote: Reason for CRM: Patient would like to discuss why she's being referred to [facility]. She stated she does not want surgery. Attempted to book an appointment, appointments for [facility] aren't until August 18th. She would like [facility] to call her back since there are no appointments until the 18th. Should like to be worked in for an appointment.

## 2023-09-24 NOTE — Telephone Encounter (Signed)
 Symptoms and Causes Temporomandibular joint dysfunction (TMD) symptoms Signs of TMD can include:  Jaw or face pain Headaches or migraines Jaw stiffness or locking Clicking, popping, or grinding sounds Trouble opening or closing your mouth Earaches or ringing Tooth pain Neck or shoulder pain If these symptoms don't go away, see a healthcare provider.  TMD causes TMD can happen for many reasons:  Jaw injury (like a break or dislocation) Teeth grinding or clenching (bruxism) Arthritis in the jaw A misaligned bite Stress that causes jaw tension Risk factors Some habits can make TMD worse:  Chewing on pens, ice, or nails Poor posture Taking large bites of food Using teeth as tools Daytime clenching or sleeping on your stomach How to lower your risk Try these tips:  Wear a mouth guard if you grind your teeth or play contact sports. Sit and stand with good posture. Reduce stress with deep breathing or meditation. Complications of TMJ disorders TMD can lead to long-term pain, chewing problems, or damage from grinding your teeth. The causes often overlap, so it may take time to find the right treatment.  Diagnosis and Tests How doctors diagnose this disorder To diagnose TMD, your provider will do a physical exam and may order imaging tests. They'll check how your jaw moves and look for signs of pain or stiffness.  Tests that are used During your exam, your provider may:  See how wide you can open your mouth Press around your jaw to check for tenderness Feel your joints as you open and close your mouth You may also need imaging tests, like:  Dental X-rays CT scans MRIs Finding TMD early can make treatment more effective.  Management and Treatment How is TMD treated? Treatment depends on what's causing your symptoms and how severe they are. Most people start with noninvasive options.  Medications Medicines can help ease pain, swelling and muscle tension. Your  provider may suggest:  Acetaminophen  (Tylenol ) for pain NSAIDs (nonsteroidal anti-inflammatory drugs) like ibuprofen (Advil) or naproxen (Aleve) Muscle relaxers for clenching or grinding Antidepressants to help with chronic pain Nonsurgical options Many therapies don't involve surgery. They focus on relaxing your jaw and improving movement:  Custom mouth guards or splints Jaw exercises through physical therapy Trigger point injections (like dry needling or steroids) Ultrasound therapy to relax muscles TENS (gentle electrical currents) to ease tension Changing habits, like avoiding hard foods or improving posture

## 2023-09-24 NOTE — Telephone Encounter (Signed)
 Pt aware to go see if they have anything else they can offer for TMJ as we don't have anything else we can do. Pt voiced understanding.

## 2023-09-24 NOTE — Addendum Note (Signed)
 Addended by: JOESPH ANNABELLA HERO on: 09/24/2023 08:05 AM   Modules accepted: Orders

## 2023-09-25 ENCOUNTER — Ambulatory Visit: Admitting: Family Medicine

## 2023-09-25 ENCOUNTER — Encounter: Payer: Self-pay | Admitting: Family Medicine

## 2023-09-25 ENCOUNTER — Other Ambulatory Visit: Payer: Self-pay | Admitting: *Deleted

## 2023-09-25 DIAGNOSIS — M26609 Unspecified temporomandibular joint disorder, unspecified side: Secondary | ICD-10-CM

## 2023-09-25 MED ORDER — TIZANIDINE HCL 4 MG PO TABS: 4.0000 mg | ORAL_TABLET | Freq: Three times a day (TID) | ORAL | 1 refills | Status: DC | PRN

## 2023-09-26 ENCOUNTER — Other Ambulatory Visit: Payer: Self-pay | Admitting: Nurse Practitioner

## 2023-09-26 ENCOUNTER — Ambulatory Visit: Admitting: Internal Medicine

## 2023-09-26 ENCOUNTER — Ambulatory Visit: Payer: Medicare HMO

## 2023-09-26 VITALS — BP 121/74 | HR 72 | Ht 64.0 in | Wt 152.0 lb

## 2023-09-26 DIAGNOSIS — Z0000 Encounter for general adult medical examination without abnormal findings: Secondary | ICD-10-CM

## 2023-09-26 DIAGNOSIS — M26609 Unspecified temporomandibular joint disorder, unspecified side: Secondary | ICD-10-CM

## 2023-09-26 NOTE — Progress Notes (Addendum)
 Subjective:   Carol Richmond is a 73 y.o. who presents for a Medicare Wellness preventive visit.  As a reminder, Annual Wellness Visits don't include a physical exam, and some assessments may be limited, especially if this visit is performed virtually. We may recommend an in-person follow-up visit with your provider if needed.  Visit Complete: Virtual I connected with  [name] on 09/26/23 by a audio enabled telemedicine application and verified that I am speaking with the correct person using two identifiers.  Patient Location: Home  Provider Location: Home Office  I discussed the limitations of evaluation and management by telemedicine. The patient expressed understanding and agreed to proceed.  Vital Signs: Because this visit was a virtual/telehealth visit, some criteria may be missing or patient reported. Any vitals not documented were not able to be obtained and vitals that have been documented are patient reported.  VideoDeclined- This patient declined Librarian, academic. Therefore the visit was completed with audio only.  Persons Participating in Visit: Patient.  AWV Questionnaire: No: Patient Medicare AWV questionnaire was not completed prior to this visit.  Cardiac Risk Factors include: advanced age (>31men, >62 women);diabetes mellitus     Objective:    Today's Vitals   09/26/23 1126  BP: 121/74  Pulse: 72  Weight: 152 lb (68.9 kg)  Height: 5' 4 (1.626 m)   Body mass index is 26.09 kg/m.     09/26/2023   12:03 PM 10/17/2022    7:51 AM 10/16/2022    5:47 PM 09/25/2022   11:09 AM 08/14/2022    9:51 AM 03/01/2022    8:29 AM 01/04/2022   11:57 AM  Advanced Directives  Does Patient Have a Medical Advance Directive? No  No Yes Yes No Yes  Type of Theme park manager;Living will Living will;Healthcare Power of [employer] Power of [place];Living will  Copy of Healthcare Power of  Attorney in Chart?    No - copy requested   No - copy requested  Would patient like information on creating a medical advance directive?  No - Patient declined         Current Medications (verified) Outpatient Encounter Medications as of 09/26/2023  Medication Sig   Accu-Chek Softclix Lancets lancets check blood sugars twice daily DX E11.65   acetaminophen  (TYLENOL ) 500 MG tablet Take 500 mg by mouth daily as needed for mild pain.   amiodarone  (PACERONE ) 200 MG tablet Take 1 tablet (200 mg total) Monday thru Friday, NONE on Saturday and Sunday   Azelastine HCl 0.15 % SOLN    Blood Pressure Monitoring (OMRON 3 SERIES BP MONITOR) DEVI Use as directed   capsaicin  (ZOSTRIX) 0.025 % cream Apply topically 2 (two) times daily.   citalopram  (CELEXA ) 20 MG tablet Take 1 tablet (20 mg total) by mouth daily.   fexofenadine  (ALLEGRA ) 180 MG tablet Take 1 tablet (180 mg total) by mouth daily. For allergy symptoms   fluticasone  (FLONASE ) 50 MCG/ACT nasal spray Place 2 sprays into both nostrils 2 (two) times daily.   furosemide  (LASIX ) 40 MG tablet take 1 tablet once daily.   gabapentin  (NEURONTIN ) 300 MG capsule Take 300 mg by mouth 3 (three) times daily as needed (neuropathy).   glucose blood (ACCU-CHEK GUIDE) test strip USE AS INSTRUCTED TO TEST BLOOD SUGAR DAILY AS DIRECTED.   lactulose  (CHRONULAC ) 10 GM/15ML solution take 30 mls (20 gram total) by mouth 3 times a day.   lamoTRIgine  (LAMICTAL ) 200  MG tablet take 1 tablet twice daily   LORazepam  (ATIVAN ) 0.5 MG tablet Take 1 tablet (0.5 mg total) by mouth 2 (two) times daily as needed for anxiety.   meloxicam  (MOBIC ) 15 MG tablet Take 1 tablet (15 mg total) by mouth daily.   metoprolol  tartrate (LOPRESSOR ) 25 MG tablet take 0.5 tablets (12.5 milligram total) by mouth 2 (two) times daily.   montelukast  (SINGULAIR ) 10 MG tablet Take 1 tablet (10 mg total) by mouth at bedtime.   nystatin  (MYCOSTATIN ) 100000 UNIT/ML suspension Take 5 mLs (500,000 Units total)  by mouth 4 (four) times daily. Swish and swallow   ondansetron  (ZOFRAN ) 4 MG tablet take 1 tablet by mouth every 8 hours as needed for nausea and vomiting.   pantoprazole  (PROTONIX ) 40 MG tablet Take 1 tablet (40 mg total) by mouth daily.   potassium chloride  SA (KLOR-CON  M) 20 MEQ tablet take 2 tablets by mouth daily.   sacubitril -valsartan  (ENTRESTO ) 24-26 MG Take 1 tablet by mouth 2 (two) times daily.   tiZANidine  (ZANAFLEX ) 4 MG tablet Take 1 tablet (4 mg total) by mouth every 8 (eight) hours as needed for muscle spasms.   traMADol  (ULTRAM ) 50 MG tablet Take 50 mg by mouth every 6 (six) hours as needed.   traZODone  (DESYREL ) 50 MG tablet Take 1 tablet (50 mg total) by mouth at bedtime as needed for sleep.   warfarin (COUMADIN ) 5 MG tablet Take 1 tablet daily except 1/2 tablet on Tuesdays and Saturdays or as directed   No facility-administered encounter medications on file as of 09/26/2023.    Allergies (verified) Patient has no known allergies.   History: Past Medical History:  Diagnosis Date   Asthma    AVNRT (AV nodal re-entry tachycardia) (HCC)    Carotid artery disease (HCC)    Cirrhosis (HCC)    Diverticulosis of colon (without mention of hemorrhage)    Essential hypertension    Functional ovarian cysts    GERD (gastroesophageal reflux disease)    Hemorrhoids, external, without mention of complication    Hormone replacement therapy (postmenopausal)    IBS (irritable bowel syndrome)    Migraine    Mitral valve disease    Pacemaker    Complete heart block - Medtronic   Right knee DJD    Seizures (HCC)    Sleep apnea    TIA (transient ischemic attack)    Type 2 diabetes mellitus (HCC)    Past Surgical History:  Procedure Laterality Date   BIOPSY  09/23/2018   Procedure: BIOPSY;  Surgeon: Golda Claudis PENNER, MD;  [facility];  Service: Endoscopy;;  duodeneum and stomach   CARDIOVERSION N/A 08/14/2022   Procedure: CARDIOVERSION;  Surgeon: Alvan Ronal BRAVO, MD;   Location: [facility] INVASIVE CV LAB;  Service: Cardiovascular;  Laterality: N/A;   CHOLECYSTECTOMY  1980   ENDARTERECTOMY Right 07/01/2017   Procedure: ENDARTERECTOMY CAROTID RIGHT;  Surgeon: Serene Gaile ORN, MD;  Location[facility] OR;  Service: Vascular;  Laterality: Right;   ESOPHAGOGASTRODUODENOSCOPY N/A 09/23/2018   Procedure: ESOPHAGOGASTRODUODENOSCOPY (EGD);  Surgeon: Golda Claudis PENNER, MD;  [facility];  Service: Endoscopy;  Laterality: N/A;  2:45-moved to 1:45 per Jenkins   ESOPHAGOGASTRODUODENOSCOPY (EGD) WITH PROPOFOL  N/A 03/07/2021   Procedure: ESOPHAGOGASTRODUODENOSCOPY (EGD) WITH PROPOFOL ;  Surgeon: Golda Claudis PENNER, MD;  [facility]: [facility];  Service: Endoscopy;  Laterality: N/A;  12:50   LEFT HEART CATHETERIZATION WITH CORONARY ANGIOGRAM N/A 11/18/2013   Procedure: LEFT HEART CATHETERIZATION WITH CORONARY ANGIOGRAM;  Surgeon:  Lonni JONETTA Cash, MD;  Location: [facility] CATH LAB;  Service: Cardiovascular;  Laterality: N/A;   OOPHORECTOMY     2012 Dr. May in [place]   PACEMAKER IMPLANT N/A 12/14/2021   Procedure: PACEMAKER IMPLANT;  Surgeon: Waddell Danelle ORN, MD;  Location: [facility] INVASIVE CV LAB;  Service: Cardiovascular;  Laterality: N/A;   PATCH ANGIOPLASTY Right 07/01/2017   Procedure: RIGHT CAROTID PATCH ANGIOPLASTY;  Surgeon: Serene Gaile ORN, MD;  Location: [facility] OR;  Service: Vascular;  Laterality: Right;   SVT ABLATION N/A 12/14/2021   Procedure: SVT ABLATION;  Surgeon: Waddell Danelle ORN, MD;  [facility]: [facility] INVAS[facility] CV LAB;  Service: Cardiovascular;  Laterality: N/A;   TEE WITHOUT CARDIOVERSION N/A 03/01/2022   Procedure: TRANSESOPHAGEAL ECHOCARDIOGRAM (TEE);  Surgeon: Francyne Headland, MD;  Location: [facility] ENDOSCOPY;  Service: Cardiovascular;  Laterality: N/A;   TEE WITHOUT CARDIOVERSION N/A 08/14/2022   Procedure: TRANSESOPHAGEAL ECHOCARDIOGRAM;  Surgeon: Alvan Ronal BRAVO, MD;  Location: [facility] INVASIVE CV LAB;  Service: Cardiovascular;  Laterality: N/A;   TONSILLECTOMY     TOTAL KNEE ARTHROPLASTY  Right 09/02/2012   Procedure: TOTAL KNEE ARTHROPLASTY- right;  Surgeon: Toribio JULIANNA Chancy, MD;  Location: [facility] OR;  Service: Orthopedics;  Laterality: Right;   TUBAL LIGATION     TYMPANOMASTOIDECTOMY Left 04/02/2016   Procedure: LEFT CANAL WALL DOWN TYMPANOMASTOIDECTOMY;  Surgeon: Daniel Moccasin, MD;  Location: [facility];  Service: ENT;  Laterality: Left;   VAGINAL HYSTERECTOMY  1990   WOUND EXPLORATION Right 07/02/2017   Procedure: RIGHT NECK EXPLORATION WITH INTRAOPERATIVE ULTRASOUND;  Surgeon: Eliza Lonni RAMAN, MD;  Location: [facility] OR;  Service: Vascular;  Laterality: Right;   Family History  Problem Relation Age of Onset   Heart disease Mother    Diabetes Mother    Heart disease Brother    Cancer Sister    Heart disease Brother    Heart disease Brother    Heart disease Son    Social History   Socioeconomic History   Marital status: Widowed    Spouse name: Not on file   Number of children: 3   Years of education: Not on file   Highest education level: Not on file  Occupational History   Occupation: retired  Tobacco Use   Smoking status: Never   Smokeless tobacco: Never  Vaping Use   Vaping status: Never Used  Substance and Sexual Activity   Alcohol use: No    Alcohol/week: 0.0 standard drinks of alcohol   Drug use: No   Sexual activity: Not Currently  Other Topics Concern   Not on file  Social History Narrative   Lives alone. Granddaughter lives across the street, her 3 children all live nearby   Social Drivers of Health   Financial Resource Strain: Low Risk  (09/26/2023)   Overall Financial Resource Strain (CARDIA)    Difficulty of Paying Living Expenses: Not hard at all  Food Insecurity: No Food Insecurity (09/26/2023)   Hunger Vital Sign    Worried About Running Out of Food in the Last Year: Never true    Ran Out of Food in the Last Year: Never true  Transportation Needs: No Transportation Needs (09/26/2023)   PRAPARE - Scientist, research (physical sciences) (Medical): No    Lack of Transportation (Non-Medical): No  Physical Activity: Insufficiently Active (09/26/2023)   Exercise Vital Sign    Days of Exercise per Week: 2 days    Minutes of Exercise per Session: 10 min  Stress: No Stress Concern  Present (09/26/2023)   [employer] of Occupational Health - Occupational Stress Questionnaire    Feeling of Stress: Not at all  Social Connections: Moderately Isolated (09/26/2023)   Social Connection and Isolation Panel    Frequency of Communication with Friends and Family: More than three times a week    Frequency of Social Gatherings with Friends and Family: More than three times a week    Attends Religious Services: More than 4 times per year    Active Member of [employer] or Organizations: No    Attends Banker Meetings: Never    Marital Status: Widowed    Tobacco Counseling Counseling given: Yes    Clinical Intake:  Pre-visit preparation completed: Yes  Pain : No/denies pain     BMI - recorded: 26.09 Nutritional Status: BMI 25 -29 Overweight Nutritional Risks: None Diabetes: No  Lab Results  Component Value Date   HGBA1C 5.4 07/30/2023   HGBA1C 5.2 04/28/2023   HGBA1C 5.6 01/27/2023     How often do you need to have someone help you when you read instructions, pamphlets, or other written materials from your doctor or pharmacy?: 1 - Never  Interpreter Needed?: No  Information entered by :: alia t/cma   Activities of Daily Living     09/26/2023   11:57 AM 10/17/2022    7:46 AM  In your present state of health, do you have any difficulty performing the following activities:  Hearing? 1   Vision? 0   Difficulty concentrating or making decisions? 0   Walking or climbing stairs? 0   Dressing or bathing? 0   Doing errands, shopping? 0 0  Preparing Food and eating ? N   Using the Toilet? N   In the past six months, have you accidently leaked urine? N   Do you have problems with loss of bowel  control? N   Managing your Medications? N   Managing your Finances? N   Housekeeping or managing your Housekeeping? N     Patient Care Team: Joesph Annabella HERO, FNP as PCP - General (Family Medicine) Debera Jayson MATSU, MD as PCP - Cardiology (Cardiology) Waddell Danelle ORN, MD as PCP - Electrophysiology (Cardiology) Georjean Darice HERO, MD as Consulting Physician (Neurology) Golda Claudis PENNER, MD (Inactive) as Consulting Physician (Gastroenterology) Dr Willma Moats Optometrist, [place], OD (Optometry) Eliza Lonni RAMAN, MD (Inactive) as Consulting Physician (Vascular Surgery) Billee Mliss BIRCH, [facility] as Pharmacist (Family Medicine) Miriam Norris, NP as Nurse Practitioner (Cardiology)  I have updated your Care Teams any recent Medical Services you may have received from other providers in the past year.     Assessment:   This is a routine wellness examination for Zaiya.  Hearing/Vision screen Hearing Screening - Comments:: Pt have hearing dif/l-ear swollen and hurts Vision Screening - Comments:: Pt wear glasses/pt goes to [facility] in [place]/last ov 2025   Goals Addressed             This Visit's Progress    Patient Stated   On track    She wants to try to walk 1-2 miles each day and lose some weight       Depression Screen     09/26/2023   12:07 PM 07/30/2023    5:16 PM 07/07/2023   10:14 AM 07/04/2023    8:00 AM 06/24/2023   10:41 AM 06/02/2023    7:58 AM 05/07/2023   11:27 AM  PHQ 2/9 Scores  PHQ - 2 Score 1 1 0 0 0  0 0  PHQ- 9 Score 2 1 0 0 0  0    Fall Risk     09/26/2023   11:54 AM 07/30/2023    5:15 PM 07/04/2023    7:59 AM 06/02/2023    7:57 AM 05/07/2023   11:27 AM  Fall Risk   Falls in the past year? 1 1 1 1 1   Number falls in past yr: 1 1 1 1 1   Injury with Fall? 0 0 0 0 0  Risk for fall due to : Impaired balance/gait;Impaired mobility History of fall(s) History of fall(s) History of fall(s) History of fall(s)  Follow up  Falls evaluation completed  Education provided Education provided Falls evaluation completed    MEDICARE RISK AT HOME:  Medicare Risk at Home Any stairs in or around the home?: Yes If so, are there any without handrails?: Yes Home free of loose throw rugs in walkways, pet beds, electrical cords, etc?: Yes Adequate lighting in your home to reduce risk of falls?: Yes Life alert?: No Use of a cane, walker or w/c?: Yes Grab bars in the bathroom?: No Shower chair or bench in shower?: Yes Elevated toilet seat or a handicapped toilet?: Yes  TIMED UP AND GO:  Was the test performed?  no  Cognitive Function: 6CIT completed    11/01/2022    1:05 PM  MMSE - Mini Mental State Exam  Orientation to time 3  Orientation to Place 2  Registration 2  Attention/ Calculation 0  Recall 1  Language- name 2 objects 2  Language- repeat 1  Language- follow 3 step command 0  Language- read & follow direction 0  Write a sentence 0  Copy design 0  Total score 11        09/26/2023   12:23 PM 09/25/2022   11:09 AM 07/05/2021    1:31 PM 07/04/2020   12:40 PM  6CIT Screen  What Year? 0 points 0 points 0 points 0 points  What month? 0 points 0 points 0 points 0 points  What time? 0 points 0 points 0 points 0 points  Count back from 20 0 points 0 points 0 points 0 points  Months in reverse 4 points 0 points 2 points 0 points  Repeat phrase 0 points 0 points 4 points 0 points  Total Score 4 points 0 points 6 points 0 points    Immunizations Immunization History  Administered Date(s) Administered   Fluad Quad(high Dose 65+) 11/20/2020, 12/03/2021   Fluad Trivalent(High Dose 65+) 12/26/2022   Hepatitis B, PED/ADOLESCENT 11/23/2020, 02/15/2021, 05/24/2021   Influenza Split 12/02/2012   Influenza,inj,Quad PF,6+ Mos 12/08/2018   Influenza,inj,quad, With Preservative 11/28/2017, 12/05/2018   Influenza-Unspecified 01/19/2020   Moderna Sars-Covid-2 Vaccination 04/15/2019, 05/14/2019, 02/29/2020   PNEUMOCOCCAL CONJUGATE-20  12/21/2020   Tdap 12/13/2002, 10/28/2022    Screening Tests Health Maintenance  Topic Date Due   OPHTHALMOLOGY EXAM  Never done   Zoster Vaccines- Shingrix (1 of 2) Never done   Colonoscopy  06/03/2022   MAMMOGRAM  10/31/2022   INFLUENZA VACCINE  09/19/2023   COVID-19 Vaccine (4 - 2024-25 season) 11/13/2023 (Originally 10/20/2022)   DEXA SCAN  04/27/2024 (Originally 06/16/2017)   Diabetic kidney evaluation - Urine ACR  11/01/2023   FOOT EXAM  01/27/2024   HEMOGLOBIN A1C  01/29/2024   Diabetic kidney evaluation - eGFR measurement  07/29/2024   Medicare Annual Wellness (AWV)  09/25/2024   DTaP/Tdap/Td (3 - Td or Tdap) 10/27/2032   Pneumococcal Vaccine: 50+ Years  Completed   Hepatitis B Vaccines  Completed   Hepatitis C Screening  Completed   HPV VACCINES  Aged Out   Meningococcal B Vaccine  Aged Out    Health Maintenance  Health Maintenance Due  Topic Date Due   OPHTHALMOLOGY EXAM  Never done   Zoster Vaccines- Shingrix (1 of 2) Never done   Colonoscopy  06/03/2022   MAMMOGRAM  10/31/2022   INFLUENZA VACCINE  09/19/2023   Health Maintenance Items Addressed: See Nurse Notes at the end of this note  Additional Screening:  Vision Screening: Recommended annual ophthalmology exams for early detection of glaucoma and other disorders of the eye. Would you like a referral to an eye doctor? No    Dental Screening: Recommended annual dental exams for proper oral hygiene  Community Resource Referral / Chronic Care Management: CRR required this visit?  No   CCM required this visit?  No   Plan:    I have personally reviewed and noted the following in the patient's chart:   Medical and social history Use of alcohol, tobacco or illicit drugs  Current medications and supplements including opioid prescriptions. Patient is not currently taking opioid prescriptions. Functional ability and status Nutritional status Physical activity Advanced directives List of other  physicians Hospitalizations, surgeries, and ER visits in previous 12 months Vitals Screenings to include cognitive, depression, and falls Referrals and appointments  In addition, I have reviewed and discussed with patient certain preventive protocols, quality metrics, and best practice recommendations. A written personalized care plan for preventive services as well as general preventive health recommendations were provided to patient.   Ozie Ned, CMA   09/26/2023   After Visit Summary: (MyChart) Due to this being a telephonic visit, the after visit summary with patients personalized plan was offered to patient via MyChart   Notes: Please refer to Routing Comments.

## 2023-09-26 NOTE — Patient Instructions (Addendum)
 Carol Richmond , Thank you for taking time out of your busy schedule to complete your Annual Wellness Visit with me. I enjoyed our conversation and look forward to speaking with you again next year. I, as well as your care team,  appreciate your ongoing commitment to your health goals. Please review the following plan we discussed and let me know if I can assist you in the future. Your Game plan/ To Do List    Referrals: If you haven't heard from the office you've been referred to, please reach out to them at the phone provided.   Follow up Visits: We will see or speak with you next year for your Next Medicare AWV with our clinical staff 09/28/24 at 11:20a.m. Have you seen your provider in the last 6 months (3 months if uncontrolled diabetes)? Yes  Clinician Recommendations:  Aim for 30 minutes of exercise or brisk walking, 6-8 glasses of water , and 5 servings of fruits and vegetables each day.       This is a list of the screenings recommended for you:  Health Maintenance  Topic Date Due   Eye exam for diabetics  Never done   Zoster (Shingles) Vaccine (1 of 2) Never done   Colon Cancer Screening  06/03/2022   Mammogram  10/31/2022   Medicare Annual Wellness Visit  09/25/2023   Flu Shot  09/19/2023   COVID-19 Vaccine (4 - 2024-25 season) 11/13/2023*   DEXA scan (bone density measurement)  04/27/2024*   Yearly kidney health urinalysis for diabetes  11/01/2023   Complete foot exam   01/27/2024   Hemoglobin A1C  01/29/2024   Yearly kidney function blood test for diabetes  07/29/2024   DTaP/Tdap/Td vaccine (3 - Td or Tdap) 10/27/2032   Pneumococcal Vaccine for age over 51  Completed   Hepatitis B Vaccine  Completed   Hepatitis C Screening  Completed   HPV Vaccine  Aged Out   Meningitis B Vaccine  Aged Out  *Topic was postponed. The date shown is not the original due date.    Advanced directives: (Declined) Advance directive discussed with you today. Even though you declined this today,  please call our office should you change your mind, and we can give you the proper paperwork for you to fill out. Advance Care Planning is important because it:  [x]  Makes sure you receive the medical care that is consistent with your values, goals, and preferences  [x]  It provides guidance to your family and loved ones and reduces their decisional burden about whether or not they are making the right decisions based on your wishes.  Follow the link provided in your after visit summary or read over the paperwork we have mailed to you to help you started getting your Advance Directives in place. If you need assistance in completing these, please reach out to us  so that we can help you!  See attachments for Preventive Care and Fall Prevention Tips.

## 2023-09-30 ENCOUNTER — Ambulatory Visit: Payer: Self-pay

## 2023-09-30 DIAGNOSIS — N761 Subacute and chronic vaginitis: Secondary | ICD-10-CM

## 2023-09-30 NOTE — Telephone Encounter (Signed)
 FYI Only or Action Required?: Action required by provider: update on patient condition.  Patient was last seen in primary care on 08/29/2023 by Carol Rock HERO, FNP.  Called Nurse Triage reporting Vaginal Itching.  Symptoms began several weeks ago.  Interventions attempted: OTC medications: Vagisil.  Symptoms are: vaginal itching unchanged.  Triage Disposition: See Physician Within 24 Hours  Patient/caregiver understands and will follow disposition?: No                Copied from CRM #[id number]. Topic: Clinical - Medical Advice >> Sep 30, 2023  9:26 AM Alfonso ORN wrote: Reason for CRM: Patient having female problem , itch real bad in vagina area need advice what to take or should go to a female doctor  pt. call back [phone] Reason for Disposition  MODERATE-SEVERE itching (i.e., interferes with school, work, or sleep)  Answer Assessment - Initial Assessment Questions Patient states it is costing too much to keep going back to the doctor. She is asking if she should go to the women's clinic in [place], or if Annabella will just prescribe something without seeing her. Advised patient to go to women's clinic since she is declining appt with PCP. She states then that she would like to just continue OTC treatment and then she will make an appt with PCP. Instructed patient since she has already been using Vagisil OTC for several weeks it is advised to be evaluated for her symptoms. Patient then adds she would need a referral to the women's clinic.   1. SYMPTOM: What's the main symptom you're concerned about? (e.g., pain, itching, dryness)     Itching.  2. LOCATION: Where is the  itchiness located? (e.g., inside/outside, left/right)     Outside both sides.  3. ONSET: When did the  itching  start?     X 3 weeks.  4. PAIN: Is there any pain? If Yes, ask: How bad is it? (Scale: 1-10; mild, moderate, severe)     No.  5. ITCHING: Is there any itching? If Yes, ask: How  bad is it? (Scale: 1-10; mild, moderate, severe)     Yes,   6. CAUSE: What do you think is causing the discharge? Have you had the same problem before? What happened then?     She states she had similar symptoms before with odor; saw PCP and did a swab but she states she was never call back with results.  7. OTHER SYMPTOMS: Do you have any other symptoms? (e.g., fever, itching, vaginal bleeding, pain with urination, injury to genital area, vaginal foreign body)     No.  8. PREGNANCY: Is there any chance you are pregnant? When was your last menstrual period?     N/A.  Protocols used: Vaginal Symptoms-A-AH

## 2023-09-30 NOTE — Telephone Encounter (Signed)
  First attempt; no answer    Copied from CRM [id number]. Topic: Clinical - Medical Advice >> Sep 30, 2023  9:26 AM Alfonso ORN wrote: Reason for CRM: Patient having female problem , itch real bad in vagina area need advice what to take or should go to a female doctor  pt. call back [phone]

## 2023-10-01 ENCOUNTER — Telehealth: Payer: Self-pay

## 2023-10-01 ENCOUNTER — Encounter (INDEPENDENT_AMBULATORY_CARE_PROVIDER_SITE_OTHER): Payer: Self-pay | Admitting: *Deleted

## 2023-10-01 NOTE — Telephone Encounter (Signed)
 Patient aware referral was sent to [facility] clinic in [place].

## 2023-10-01 NOTE — Telephone Encounter (Signed)
Left detailed message per signed DPR °

## 2023-10-01 NOTE — Telephone Encounter (Signed)
 Referral sent

## 2023-10-01 NOTE — Addendum Note (Signed)
 Addended by: JOESPH ANNABELLA HERO on: 10/01/2023 07:59 AM   Modules accepted: Orders

## 2023-10-01 NOTE — Telephone Encounter (Signed)
 Copied from CRM [id number]. Topic: General - Other >> Oct 01, 2023  3:20 PM Carol Richmond wrote: Reason for CRM: Patient called in to return call to [facility] Please call [phone]

## 2023-10-08 ENCOUNTER — Encounter: Payer: Self-pay | Admitting: Nurse Practitioner

## 2023-10-08 ENCOUNTER — Ambulatory Visit (INDEPENDENT_AMBULATORY_CARE_PROVIDER_SITE_OTHER): Admitting: Nurse Practitioner

## 2023-10-08 VITALS — BP 135/64 | HR 63 | Temp 97.7°F | Ht 64.0 in | Wt 156.0 lb

## 2023-10-08 DIAGNOSIS — M26609 Unspecified temporomandibular joint disorder, unspecified side: Secondary | ICD-10-CM | POA: Diagnosis not present

## 2023-10-08 NOTE — Progress Notes (Addendum)
 Acute Office Visit  Subjective:     [name], female    DOB: November 05, 1950, 73 y.o.   MRN: [id number]  Chief Complaint  Patient presents with   Temporomandibular Joint Pain   Sore Throat    HPI Carol Richmond is a 73 year old female presenting on October 08, 2023, for an acute visit with concern for sore throat. She reports a recent diagnosis of temporomandibular joint disorder (TMJ) by her PCP but declined referral to a specialist at that time. She states her sore throat began shortly after her TMJ diagnosis. She has not been prescribed any medication specifically for TMJ. Current medications include Zanaflex , Ultram  50 mg every 6 hours as needed for pain, and meloxicam  (Mobic ). She was reports that in the past she saw a provider in [place] that suggested Gabapentin  for pain.  She is particularly concerned about a mild sore inside her mouth. A friend who previously worked at a dental office told her it appeared to be an oral sore. Patient describes the sore as located inside her cheek, with occasional sensation of "dropping down." Denies fever, chills, cough, congestion, odynophagia, dysphagia, chest pain, or shortness of breath. No additional concerns today.  Active Ambulatory Problems    Diagnosis Date Noted   GERD (gastroesophageal reflux disease)    Hypertension associated with type 2 diabetes mellitus (HCC)    External hemorrhoids    Right knee DJD    IBS (irritable bowel syndrome)    Diverticulosis of colon    Osteoarthritis of right knee 09/03/2012   Chronic daily headache 12/03/2013   Depression, recurrent (HCC) 07/25/2014   Migraine without aura and without status migrainosus, not intractable 01/27/2015   Mild neurocognitive disorder 05/02/2016   Generalized anxiety disorder 07/01/2016   Stenosis of right carotid artery 06/23/2017   Orthostatic tremor 06/23/2017   Carotid artery stenosis, symptomatic, right 07/01/2017   Peptic ulcer disease  01/25/2019   Iron  deficiency anemia 01/25/2019   Fatty liver disease, nonalcoholic 01/25/2019   Cirrhosis, nonalcoholic (HCC) 03/28/2020   Acute metabolic encephalopathy 03/28/2020   Cardiomyopathy (HCC) 08/07/2017   Cervical spondylosis 06/07/2019   Non-rheumatic mitral regurgitation 08/07/2017   Non-rheumatic tricuspid valve insufficiency 08/07/2017   PAD (peripheral artery disease) (HCC) 08/07/2017   Type 2 diabetes mellitus with hyperglycemia, without long-term current use of insulin  (HCC) 08/07/2017   Angina, class III (HCC) 06/27/2020   Benzodiazepine dependence (HCC) 05/15/2021   Moderate nonproliferative diabetic retinopathy of left eye, associated with type 2 diabetes mellitus (HCC) 09/20/2021   Posterior vitreous detachment of left eye 09/20/2021   Stable central retinal vein occlusion of right eye 09/20/2021   Hypertensive retinopathy of both eyes, grade 1 09/20/2021   Nuclear sclerotic cataract of left eye 09/20/2021   SVT (supraventricular tachycardia) (HCC) 09/28/2021   Chronic HFrEF (heart failure with reduced ejection fraction) (HCC) 09/28/2021   Hypokalemia 09/28/2021   Mitral stenosis with regurgitation 09/30/2021   Wide-complex tachycardia 09/30/2021   Controlled substance agreement signed 12/03/2021   Heart block AV complete (HCC) 12/15/2021   LBBB (left bundle branch block) 12/15/2021   S/P placement of cardiac pacemaker 12/28/2021   Burning tongue syndrome 03/28/2022   Lymphadenopathy 07/10/2022   Hyperlipidemia associated with type 2 diabetes mellitus (HCC) 07/22/2022   Encounter to discuss test results 08/07/2022   Atrial fibrillation (HCC) 08/11/2022   Atrial flutter (HCC) 08/11/2022   Cold sore 08/12/2022   Encounter for therapeutic drug monitoring 08/20/2022   Esophageal varices (HCC) 09/30/2022  UTI (urinary tract infection) 10/17/2022   Long term current use of anticoagulant 10/23/2022   Anxiety 10/28/2022   Atherosclerosis of native artery of  right lower extremity with intermittent claudication (HCC) 10/28/2022   Presence of heart assist device (HCC) 07/30/2023   Resolved Ambulatory Problems    Diagnosis Date Noted   SHOULDER PAIN 08/24/2008   IMPINGEMENT SYNDROME 08/24/2008   Hormone replacement therapy (postmenopausal)    Migraine    Partial seizure (HCC)    External hemorrhoids    Transient alteration of awareness 05/25/2013   Transient left leg weakness 08/16/2016   Absolute anemia 08/17/2018   Oral candidiasis 01/25/2019   Cervical radiculitis 05/28/2019   Supraventricular tachycardia (HCC) 06/27/2020   Prediabetes 09/20/2020   Elevated lipoprotein(a) 12/21/2020   URI with cough and congestion 12/25/2020   Bloating 08/02/2021   LUQ pain 08/02/2021   AKI (acute kidney injury) (HCC) 10/16/2022   Past Medical History:  Diagnosis Date   Asthma    AVNRT (AV nodal re-entry tachycardia) (HCC)    Carotid artery disease (HCC)    Cirrhosis (HCC)    Diverticulosis of colon (without mention of hemorrhage)    Essential hypertension    Functional ovarian cysts    Hemorrhoids, external, without mention of complication    Migraine    Mitral valve disease    Pacemaker    Seizures (HCC)    Sleep apnea    TIA (transient ischemic attack)    Type 2 diabetes mellitus (HCC)     ROS Negative unless indicated in HPI    Objective:    BP 135/64   Pulse 63   Temp 97.7 F (36.5 C) (Temporal)   Ht 5' 4 (1.626 m)   Wt 156 lb (70.8 kg)   SpO2 97%   BMI 26.78 kg/m  BP Readings from Last 3 Encounters:  10/08/23 135/64  09/26/23 121/74  08/29/23 121/74   Wt Readings from Last 3 Encounters:  10/08/23 156 lb (70.8 kg)  09/26/23 152 lb (68.9 kg)  08/29/23 152 lb (68.9 kg)      Physical Exam General: Alert, oriented 3, in no acute distress.  HEENT: Oral mucosa moist. No ulcerated lesion noted on the buccal mucosa ,  No tonsillar exudates, no pharyngeal erythema. No lymphadenopathy. TMJ tenderness present on  palpation.  Lungs: Clear to auscultation bilaterally.  Cardiovascular: Regular rate and rhythm, no murmurs.  Skin: No rashes or additional lesions noted. Pertinent labs & imaging results that were available during my care of the patient were reviewed by me and considered in my medical decision making.  No results found for any visits on 10/08/23.      Assessment & Plan:  TMJ (temporomandibular joint disorder)   Carol Richmond is a 73 year old Caucasian female seen today for TMG, no acute distress Temporomandibular joint disorder (TMJ) - symptomatic, patient on muscle relaxant and NSAID. -Continue current pain regimen: meloxicam  (Mobic ), Ultram  as needed, Zanaflex .  Follow up with PCP or oral/maxillofacial specialist if TMJ symptoms persist or worsen.  RTC (return to clinic) if new or worsening symptoms develop. Return if symptoms worsen or fail to improve.  Carol Rollings [place] Thompson, DNP [facility] [address] [place], [place] [address] [phone]  Note: This document was prepared by [employer] voice dictation technology and any errors that results from this process are unintentional.

## 2023-10-09 ENCOUNTER — Telehealth: Payer: Self-pay | Admitting: *Deleted

## 2023-10-09 ENCOUNTER — Other Ambulatory Visit: Payer: Self-pay | Admitting: *Deleted

## 2023-10-09 DIAGNOSIS — K7460 Unspecified cirrhosis of liver: Secondary | ICD-10-CM

## 2023-10-09 NOTE — Telephone Encounter (Signed)
 Pt informed US  is scheduled for 11/13/23, Thursday at [facility], arrive at 8:15 am, NPO after midnight.  Pt left vm stating she received letter to schedule US .

## 2023-10-13 ENCOUNTER — Telehealth: Payer: Self-pay | Admitting: Family Medicine

## 2023-10-13 NOTE — Telephone Encounter (Signed)
 Referral has been sent to [facility] in [place] as Patient requested.  MyChart Message sent to Patient with Specialty Office contact information.

## 2023-10-13 NOTE — Telephone Encounter (Signed)
 Copied from CRM [id number]. Topic: Referral - Status >> Oct 13, 2023 11:20 AM Corin V wrote: Reason for CRM: Patient has still not heard back for her referral to the women's clinic in [facility] for a gynecologist. Please confirm referral was sent and received by clinic and call patient back with update at [phone]

## 2023-10-13 NOTE — Telephone Encounter (Signed)
 Pt aware referral placed appropriately.

## 2023-10-14 ENCOUNTER — Telehealth (INDEPENDENT_AMBULATORY_CARE_PROVIDER_SITE_OTHER): Payer: Self-pay | Admitting: Gastroenterology

## 2023-10-14 NOTE — Telephone Encounter (Signed)
 She has already been scheduled for the US . Pt has been made aware.

## 2023-10-14 NOTE — Telephone Encounter (Signed)
 Patient received letter that it was time to schedule her U/S. [phone]

## 2023-10-14 NOTE — Telephone Encounter (Signed)
 Pt has questions about her lab [phone]

## 2023-10-14 NOTE — Telephone Encounter (Signed)
 Pt states she received a lab request and wanted to know if needed to have it done. Advised pt that she needs to have lab done on the same day of US  11/13/23. Verbalized understanding.

## 2023-10-14 NOTE — Telephone Encounter (Signed)
 I called both numbers listed in the patient chart the home number vm full unable to leave a message. I have called the 612 number and left a vm that if she still needed our assistance to please call the office .

## 2023-10-21 ENCOUNTER — Encounter

## 2023-10-21 ENCOUNTER — Telehealth: Payer: Self-pay | Admitting: Family Medicine

## 2023-10-21 NOTE — Telephone Encounter (Signed)
 Copied from CRM [id number]. Topic: Clinical - Medical Advice >> Oct 21, 2023 10:04 AM Mercedes MATSU wrote: Reason for CRM: Patient states she couldn't get her breathe yesterday, she said she feels better but wanted to know if she needed a breathing treatment and if Karla can give her a call back as soon as possible. [phone] is a call back.

## 2023-10-21 NOTE — Telephone Encounter (Unsigned)
 Copied from CRM [id number]. Topic: General - Call Back - No Documentation >> Oct 21, 2023  2:55 PM Gustabo D wrote: Pt returning a call

## 2023-10-22 ENCOUNTER — Telehealth: Payer: Self-pay | Admitting: Family Medicine

## 2023-10-22 ENCOUNTER — Telehealth (INDEPENDENT_AMBULATORY_CARE_PROVIDER_SITE_OTHER): Payer: Self-pay

## 2023-10-22 ENCOUNTER — Telehealth (INDEPENDENT_AMBULATORY_CARE_PROVIDER_SITE_OTHER): Payer: Self-pay | Admitting: Gastroenterology

## 2023-10-22 NOTE — Telephone Encounter (Unsigned)
 Copied from CRM [id number]. Topic: General - Call Back - No Documentation >> Oct 21, 2023  2:55 PM Gustabo D wrote: Pt returning a call >> Oct 21, 2023  5:21 PM Tysheama G wrote: Patient wanted nurse Arnulfo to call her back. [phone]

## 2023-10-22 NOTE — Telephone Encounter (Signed)
Attempted to contact patient. VM full.

## 2023-10-22 NOTE — Telephone Encounter (Signed)
 Duplicate call.

## 2023-10-22 NOTE — Telephone Encounter (Signed)
 Duplicate

## 2023-10-22 NOTE — Telephone Encounter (Signed)
 Valere Sari HERO, LPN  Arthurine Merle, LPN Needs AFP end of September 2025 per chelsea       Previous Messages    ----- Message ----- From: Valere Sari HERO, LPN Sent: 6/68/7974   8:09 AM EDT To: Sari HERO Valere, LPN Subject: labs                                          Needs AFP end of September per chelsea    AFP lab already ordered. Printed off requisition and will mail it to pt with note to have done at end of September

## 2023-10-22 NOTE — Telephone Encounter (Signed)
 Copied from CRM [id number]. Topic: Clinical - Medical Advice >> Oct 21, 2023 10:04 AM Mercedes MATSU wrote: Reason for CRM: Patient states she couldn't get her breathe yesterday, she said she feels better but wanted to know if she needed a breathing treatment and if Karla can give her a call back as soon as possible. [phone] is a call back. >> Oct 21, 2023  5:18 PM Zebedee SAUNDERS wrote: Pt would like a call [phone] back regarding breathing treatment.

## 2023-10-22 NOTE — Telephone Encounter (Signed)
 Patient called questioning which side her liver was on as she was having some intermittent pain at times near her belly button. She says pain mainly happens when she breaths and does not happen all the time. I advised that the liver is not located near her belly button. That is was located in the RUQ under her rib cage. Patient says she thinks its gas. I advised if it continues she needs to call PCP or our office for an appointment to have this looked at. Patient says she is scheduled soon for her repeat us  on 11/13/2023 and her labs we have ordered. I reiterated if the pain continues she needs to have it looked at by PCP or our office. Patient states understanding.

## 2023-10-27 ENCOUNTER — Ambulatory Visit: Attending: Cardiology | Admitting: *Deleted

## 2023-10-27 DIAGNOSIS — I4891 Unspecified atrial fibrillation: Secondary | ICD-10-CM | POA: Diagnosis not present

## 2023-10-27 DIAGNOSIS — Z5181 Encounter for therapeutic drug level monitoring: Secondary | ICD-10-CM

## 2023-10-27 LAB — POCT INR: INR: 2.8 (ref 2.0–3.0)

## 2023-10-27 NOTE — Telephone Encounter (Signed)
 Multiple attempts to contact pt, VM full, pt has not returned call, will close encounter.

## 2023-10-27 NOTE — Progress Notes (Signed)
 INR-2.8 Please see anticoagulation encounter

## 2023-10-27 NOTE — Patient Instructions (Signed)
 Continue warfarin 1 tablet daily except 1/2 tablet on Tuesdays and Saturdays Recheck in 6 wks

## 2023-10-30 ENCOUNTER — Other Ambulatory Visit (INDEPENDENT_AMBULATORY_CARE_PROVIDER_SITE_OTHER): Payer: Self-pay | Admitting: Gastroenterology

## 2023-10-30 ENCOUNTER — Other Ambulatory Visit (INDEPENDENT_AMBULATORY_CARE_PROVIDER_SITE_OTHER): Payer: Self-pay

## 2023-10-30 DIAGNOSIS — K7682 Hepatic encephalopathy: Secondary | ICD-10-CM

## 2023-10-30 MED ORDER — LACTULOSE 10 GM/15ML PO SOLN: 20.0000 g | Freq: Three times a day (TID) | ORAL | 3 refills | Status: DC

## 2023-10-31 ENCOUNTER — Encounter: Admitting: Internal Medicine

## 2023-11-03 ENCOUNTER — Ambulatory Visit (INDEPENDENT_AMBULATORY_CARE_PROVIDER_SITE_OTHER): Admitting: Family Medicine

## 2023-11-03 ENCOUNTER — Encounter: Payer: Self-pay | Admitting: Family Medicine

## 2023-11-03 VITALS — BP 101/62 | HR 59 | Temp 97.9°F | Ht 64.0 in | Wt 154.8 lb

## 2023-11-03 DIAGNOSIS — Z95811 Presence of heart assist device: Secondary | ICD-10-CM

## 2023-11-03 DIAGNOSIS — E1159 Type 2 diabetes mellitus with other circulatory complications: Secondary | ICD-10-CM

## 2023-11-03 DIAGNOSIS — J301 Allergic rhinitis due to pollen: Secondary | ICD-10-CM

## 2023-11-03 DIAGNOSIS — E1169 Type 2 diabetes mellitus with other specified complication: Secondary | ICD-10-CM

## 2023-11-03 DIAGNOSIS — K7460 Unspecified cirrhosis of liver: Secondary | ICD-10-CM

## 2023-11-03 DIAGNOSIS — E1165 Type 2 diabetes mellitus with hyperglycemia: Secondary | ICD-10-CM | POA: Diagnosis not present

## 2023-11-03 DIAGNOSIS — H348112 Central retinal vein occlusion, right eye, stable: Secondary | ICD-10-CM

## 2023-11-03 DIAGNOSIS — F339 Major depressive disorder, recurrent, unspecified: Secondary | ICD-10-CM

## 2023-11-03 DIAGNOSIS — I8500 Esophageal varices without bleeding: Secondary | ICD-10-CM

## 2023-11-03 DIAGNOSIS — F1320 Sedative, hypnotic or anxiolytic dependence, uncomplicated: Secondary | ICD-10-CM | POA: Diagnosis not present

## 2023-11-03 DIAGNOSIS — I5022 Chronic systolic (congestive) heart failure: Secondary | ICD-10-CM

## 2023-11-03 DIAGNOSIS — N1831 Chronic kidney disease, stage 3a: Secondary | ICD-10-CM | POA: Diagnosis not present

## 2023-11-03 DIAGNOSIS — I152 Hypertension secondary to endocrine disorders: Secondary | ICD-10-CM

## 2023-11-03 DIAGNOSIS — E785 Hyperlipidemia, unspecified: Secondary | ICD-10-CM | POA: Diagnosis not present

## 2023-11-03 DIAGNOSIS — F411 Generalized anxiety disorder: Secondary | ICD-10-CM

## 2023-11-03 LAB — BAYER DCA HB A1C WAIVED: HB A1C (BAYER DCA - WAIVED): 5.8 % — ABNORMAL HIGH (ref 4.8–5.6)

## 2023-11-03 MED ORDER — LEVOCETIRIZINE DIHYDROCHLORIDE 5 MG PO TABS: 5.0000 mg | ORAL_TABLET | Freq: Every evening | ORAL | 3 refills | Status: DC

## 2023-11-03 MED ORDER — OLOPATADINE HCL 0.2 % OP SOLN: 1.0000 [drp] | Freq: Every day | OPHTHALMIC | 0 refills | Status: AC | PRN

## 2023-11-03 MED ORDER — BUSPIRONE HCL 5 MG PO TABS: 5.0000 mg | ORAL_TABLET | Freq: Two times a day (BID) | ORAL | 3 refills | Status: AC

## 2023-11-03 NOTE — Progress Notes (Signed)
 Established Patient Office Visit  Subjective   [name], female    DOB: 08-Jan-1951  Age: 73 y.o. MRN: [id number]  Chief Complaint  Patient presents with   Medical Management of Chronic Issues    HPI  DM Pt presents for follow up evaluation of Type 2 diabetes mellitus. Patient denies foot ulcerations, increased appetite, nausea, paresthesia of the feet, polydipsia, polyuria, visual disturbances, vomiting, and weight loss.  Current diabetic medications include: none currently, diet controlled Compliant with meds - Yes  Current monitoring regimen: not checking  2. HTN/ A fib/ CHF, s/p pacemaker Complaint with meds - Yes Current Medications -  amiodarone , lasix , metoprolol , entreso, coumadin  Pertinent ROS:  Headache - No Fatigue - No Visual Disturbances - No Chest pain - No Dyspnea - No Palpitations - No LE edema - No  Followed closely by cardiology   3.  HLD She has declined medications. She has lost 2 pounds since her last visit.   4. Cirrhosis Denies abdominal pain, nausea, vomiting, itching, hematemesis. She does have esophageal varies.   5. Anxiety/depression Compliant with celexa .Taking ativan  BID prn. She had been taking this BID daily due to increased anxiety. Feels like anxiety isn't well controlled and is again requesting a higher dosage of ativan  to help.   6. Allergies Increased symptoms lately. She would like to know if there is an antihistamine that her insurance will cover.  Right now she has been taking allergra OTC. Also reports watery, itchy eyes.      11/03/2023   11:22 AM 09/26/2023   12:07 PM 07/30/2023    5:16 PM  Depression screen PHQ 2/9  Decreased Interest 0 0 0  Down, Depressed, Hopeless 0 1 1  PHQ - 2 Score 0 1 1  Altered sleeping 0 0 0  Tired, decreased energy 0 1 0  Change in appetite 0 0 0  Feeling bad or failure about yourself  0 0 0  Trouble concentrating 0 0 0  Moving slowly or fidgety/restless 0 0 0   Suicidal thoughts 0 0 0  PHQ-9 Score 0 2 1  Difficult doing work/chores Not difficult at all Not difficult at all Not difficult at all      11/03/2023   11:23 AM 07/30/2023    5:20 PM 07/07/2023   10:14 AM 07/04/2023    8:00 AM  GAD 7 : Generalized Anxiety Score  Nervous, Anxious, on Edge 3 3 0 0  Control/stop worrying 0 0 0 0  Worry too much - different things 0 0 0 0  Trouble relaxing 0 0 0 0  Restless 0 0 0 0  Easily annoyed or irritable 0 0 0 0  Afraid - awful might happen 0 0 0 0  Total GAD 7 Score 3 3 0 0  Anxiety Difficulty Not difficult at all Not difficult at all Not difficult at all Not difficult at all      Past Medical History:  Diagnosis Date   Asthma    AVNRT (AV nodal re-entry tachycardia) (HCC)    Carotid artery disease (HCC)    Cirrhosis (HCC)    Diverticulosis of colon (without mention of hemorrhage)    Essential hypertension    Functional ovarian cysts    GERD (gastroesophageal reflux disease)    Hemorrhoids, external, without mention of complication    Hormone replacement therapy (postmenopausal)    IBS (irritable bowel syndrome)    Migraine    Mitral valve disease    Pacemaker  Complete heart block - [employer]   Right knee DJD    Seizures (HCC)    Sleep apnea    TIA (transient ischemic attack)    Type 2 diabetes mellitus (HCC)       ROS As per HPI.    Objective:     BP 101/62   Pulse (!) 59   Temp 97.9 F (36.6 C) (Temporal)   Ht 5' 4 (1.626 m)   Wt 154 lb 12.8 oz (70.2 kg)   SpO2 97%   BMI 26.57 kg/m  Wt Readings from Last 3 Encounters:  11/03/23 154 lb 12.8 oz (70.2 kg)  10/08/23 156 lb (70.8 kg)  09/26/23 152 lb (68.9 kg)     Physical Exam Vitals and nursing note reviewed.  Constitutional:      General: She is not in acute distress.    Appearance: Normal appearance. She is not ill-appearing, toxic-appearing or diaphoretic.  HENT:     Right Ear: Tympanic membrane, ear canal and external ear normal.     Left Ear:  Tympanic membrane, ear canal and external ear normal.     Mouth/Throat:     Mouth: Mucous membranes are moist.     Pharynx: Oropharynx is clear.  Eyes:     General: No scleral icterus. Neck:     Thyroid : No thyroid  mass, thyromegaly or thyroid  tenderness.  Cardiovascular:     Rate and Rhythm: Normal rate and regular rhythm.     Heart sounds: Murmur heard.     Systolic murmur is present with a grade of 2/6.  Pulmonary:     Effort: Pulmonary effort is normal. No respiratory distress.     Breath sounds: Normal breath sounds.  Abdominal:     General: Bowel sounds are normal. There is no distension.     Palpations: Abdomen is soft.     Tenderness: There is no abdominal tenderness. There is no guarding or rebound.  Musculoskeletal:     Cervical back: Neck supple.     Right lower leg: No edema.     Left lower leg: No edema.  Lymphadenopathy:     Cervical: No cervical adenopathy.  Skin:    General: Skin is warm and dry.     Coloration: Skin is not jaundiced.  Neurological:     Mental Status: She is alert and oriented to person, place, and time. Mental status is at baseline.     Gait: Gait (using cane) normal.  Psychiatric:        Attention and Perception: Attention normal.        Mood and Affect: Mood normal.        Speech: Speech normal.        Behavior: Behavior normal.        Cognition and Memory: Cognition is impaired. Memory is impaired.      No results found for any visits on 11/03/23.    The 10-year ASCVD risk score (Arnett DK, et al., 2019) is: 16.2%    Assessment & Plan:   Isha was seen today for medical management of chronic issues.  Diagnoses and all orders for this visit:  Type 2 diabetes mellitus with hyperglycemia, without long-term current use of insulin  (HCC) A1c pending. Has been well controlled with diet. One ACE/ARB and statin.  -     Bayer DCA Hb A1c Waived -     CBC with Differential/Platelet -     CMP14+EGFR -     Microalbumin / creatinine  urine ratio  Hyperlipidemia associated with type 2 diabetes mellitus (HCC) She has declined statin. Last LDL was 104.  Hypertension associated with type 2 diabetes mellitus (HCC) BP at goal.   Presence of heart assist device [facility]) S/p pacemaker.   Chronic HFrEF (heart failure with reduced ejection fraction) (HCC) Euvolemic on exam.   Chronic kidney disease, stage 3a (HCC) On ACE/ARB. CMP pending.   Cirrhosis, nonalcoholic (HCC) Esophageal varices without bleeding, unspecified esophageal varices type (HCC) Managed by GI. LFTs have been stable.   Stable central retinal vein occlusion of right eye Denies changes in vision. Established with ophthalmology.   Depression, recurrent (HCC) Stable with celexa .   Generalized anxiety disorder Benzodiazepine dependence (HCC) Discussed unable to increase ativan  dosage due to risks with benzos. Continue celexa . Will add buspar  BID as below.  -     busPIRone  (BUSPAR ) 5 MG tablet; Take 1 tablet (5 mg total) by mouth 2 (two) times daily.  Seasonal allergic rhinitis due to pollen Can switch to xyzal  if insurance will cover. Use pataday  prn.  -     Olopatadine  HCl 0.2 % SOLN; Apply 1 drop to eye daily as needed (watery, itchy eyes). -     levocetirizine (XYZAL ) 5 MG tablet; Take 1 tablet (5 mg total) by mouth every evening.  Return in about 3 months (around 02/02/2024) for chronic follow up.   The patient indicates understanding of these issues and agrees with the plan.  Annabella CHRISTELLA Search, FNP

## 2023-11-04 ENCOUNTER — Telehealth: Payer: Self-pay | Admitting: Family Medicine

## 2023-11-04 LAB — CMP14+EGFR
ALT: 16 IU/L (ref 0–32)
AST: 29 IU/L (ref 0–40)
Albumin: 4.4 g/dL (ref 3.8–4.8)
Alkaline Phosphatase: 88 IU/L (ref 49–135)
BUN/Creatinine Ratio: 11 — ABNORMAL LOW (ref 12–28)
BUN: 17 mg/dL (ref 8–27)
Bilirubin Total: 0.3 mg/dL (ref 0.0–1.2)
CO2: 26 mmol/L (ref 20–29)
Calcium: 9.5 mg/dL (ref 8.7–10.3)
Chloride: 102 mmol/L (ref 96–106)
Creatinine, Ser: 1.48 mg/dL — ABNORMAL HIGH (ref 0.57–1.00)
Globulin, Total: 2.5 g/dL (ref 1.5–4.5)
Glucose: 95 mg/dL (ref 70–99)
Potassium: 5.3 mmol/L — ABNORMAL HIGH (ref 3.5–5.2)
Sodium: 143 mmol/L (ref 134–144)
Total Protein: 6.9 g/dL (ref 6.0–8.5)
eGFR: 38 mL/min/1.73 — ABNORMAL LOW (ref >59)

## 2023-11-04 LAB — CBC WITH DIFFERENTIAL/PLATELET
Basophils Absolute: 0 x10E3/uL (ref 0.0–0.2)
Basos: 0 %
EOS (ABSOLUTE): 0.1 x10E3/uL (ref 0.0–0.4)
Eos: 1 %
Hematocrit: 37.7 % (ref 34.0–46.6)
Hemoglobin: 12.2 g/dL (ref 11.1–15.9)
Immature Grans (Abs): 0 x10E3/uL (ref 0.0–0.1)
Immature Granulocytes: 0 %
Lymphocytes Absolute: 2.4 x10E3/uL (ref 0.7–3.1)
Lymphs: 26 %
MCH: 30 pg (ref 26.6–33.0)
MCHC: 32.4 g/dL (ref 31.5–35.7)
MCV: 93 fL (ref 79–97)
Monocytes Absolute: 1 x10E3/uL — ABNORMAL HIGH (ref 0.1–0.9)
Monocytes: 10 %
Neutrophils Absolute: 5.9 x10E3/uL (ref 1.4–7.0)
Neutrophils: 63 %
Platelets: 188 x10E3/uL (ref 150–450)
RBC: 4.07 x10E6/uL (ref 3.77–5.28)
RDW: 13.2 % (ref 11.7–15.4)
WBC: 9.4 x10E3/uL (ref 3.4–10.8)

## 2023-11-04 LAB — MICROALBUMIN / CREATININE URINE RATIO
Creatinine, Urine: 30.3 mg/dL
Microalb/Creat Ratio: 15 mg/g{creat} (ref 0–29)
Microalbumin, Urine: 4.6 ug/mL

## 2023-11-04 NOTE — Telephone Encounter (Signed)
  CALLED PATIENT SHE IS AWARE OF HER APPT

## 2023-11-04 NOTE — Telephone Encounter (Signed)
 Copied from CRM [id number]. Topic: Clinical - Medication Question >> Nov 03, 2023  5:53 PM Tobias L wrote: Reason for CRM: Patient seen by Tiffany today. Patient has questions about her medication.   Requesting callback to discuss, [phone]

## 2023-11-05 ENCOUNTER — Ambulatory Visit: Payer: Self-pay | Admitting: Family Medicine

## 2023-11-05 ENCOUNTER — Other Ambulatory Visit: Payer: Self-pay

## 2023-11-05 DIAGNOSIS — I6523 Occlusion and stenosis of bilateral carotid arteries: Secondary | ICD-10-CM

## 2023-11-05 DIAGNOSIS — E875 Hyperkalemia: Secondary | ICD-10-CM

## 2023-11-05 DIAGNOSIS — I6521 Occlusion and stenosis of right carotid artery: Secondary | ICD-10-CM

## 2023-11-06 ENCOUNTER — Other Ambulatory Visit

## 2023-11-06 DIAGNOSIS — E875 Hyperkalemia: Secondary | ICD-10-CM

## 2023-11-07 ENCOUNTER — Ambulatory Visit: Payer: Self-pay | Admitting: Family Medicine

## 2023-11-07 ENCOUNTER — Telehealth: Payer: Self-pay

## 2023-11-07 ENCOUNTER — Other Ambulatory Visit: Payer: Self-pay | Admitting: Family Medicine

## 2023-11-07 DIAGNOSIS — K146 Glossodynia: Secondary | ICD-10-CM

## 2023-11-07 LAB — POTASSIUM: Potassium: 4.8 mmol/L (ref 3.5–5.2)

## 2023-11-07 NOTE — Telephone Encounter (Signed)
 Duplicate

## 2023-11-07 NOTE — Telephone Encounter (Signed)
 Patient aware and verbalized understanding.

## 2023-11-07 NOTE — Telephone Encounter (Signed)
 Copied from CRM #[id number]. Topic: General - Call Back - No Documentation >> Nov 07, 2023  8:08 AM Avram MATSU wrote: Reason for CRM: Patient would like a call from carla, please advise [phone] (H) [phone] (M)

## 2023-11-07 NOTE — Telephone Encounter (Signed)
 Copied from CRM #[id number]. Topic: General - Call Back - No Documentation >> Nov 07, 2023  8:08 AM Carol Richmond wrote: Reason for CRM: Patient would like a call from carla, please advise [phone] (H) [phone] (M) >> Nov 07, 2023  1:31 PM Carol Richmond wrote:  busPIRone  (BUSPAR ) 5 MG tablet and LORazepam  (ATIVAN ) 0.5 MG tablet  Carol Richmond wants to know if she needs to take these medications together. busPIRone  is a new medication. Carol Richmond will not take any today of busPIRone  until she speaks with Carol Richmond's nurse. Her numbers are [phone] (H) [phone] (M)

## 2023-11-07 NOTE — Telephone Encounter (Signed)
 Called and patient is aware she can take.

## 2023-11-10 ENCOUNTER — Other Ambulatory Visit: Payer: Self-pay | Admitting: Family Medicine

## 2023-11-11 DIAGNOSIS — N898 Other specified noninflammatory disorders of vagina: Secondary | ICD-10-CM | POA: Diagnosis not present

## 2023-11-13 ENCOUNTER — Ambulatory Visit (HOSPITAL_COMMUNITY)
Admission: RE | Admit: 2023-11-13 | Discharge: 2023-11-13 | Disposition: A | Discharge status: Home / Self Care | Source: Ambulatory Visit | Attending: Gastroenterology | Admitting: Gastroenterology

## 2023-11-13 DIAGNOSIS — Z9049 Acquired absence of other specified parts of digestive tract: Secondary | ICD-10-CM | POA: Diagnosis not present

## 2023-11-13 DIAGNOSIS — K7460 Unspecified cirrhosis of liver: Secondary | ICD-10-CM | POA: Diagnosis not present

## 2023-11-14 LAB — AFP TUMOR MARKER: AFP-Tumor Marker: 2.5 ng/mL

## 2023-11-17 ENCOUNTER — Ambulatory Visit (INDEPENDENT_AMBULATORY_CARE_PROVIDER_SITE_OTHER): Payer: Self-pay | Admitting: Gastroenterology

## 2023-11-17 ENCOUNTER — Telehealth (INDEPENDENT_AMBULATORY_CARE_PROVIDER_SITE_OTHER): Payer: Self-pay

## 2023-11-17 NOTE — Telephone Encounter (Signed)
 Ann please place in the recalls for a six month scan. Thanks,   I spoke with the patient and made her aware per Mitzie Boettcher NP, I have reviewed it, you can let the patient know it looks stable with no obvious masses or lesions, will need to repeat again in 6 months Patient states understanding of all.

## 2023-11-17 NOTE — Telephone Encounter (Signed)
 6 mth US  noted in recall

## 2023-11-17 NOTE — Telephone Encounter (Signed)
 Patient called and left a message asking what her labs and her US  showed. I made patient aware per [facility],  AFP tumor marker is normal, will repeat again in 6 months.(Per Chelsea,she will draw at next OV).  Patient states understanding. I did advise the US  had not been reviewed as of yet and when it was we would let her know the results of this. Patient states understanding.

## 2023-11-20 NOTE — Progress Notes (Signed)
 Remote PPM Transmission

## 2023-11-24 ENCOUNTER — Other Ambulatory Visit: Payer: Self-pay

## 2023-11-24 MED ORDER — SACUBITRIL-VALSARTAN 24-26 MG PO TABS: 1.0000 | ORAL_TABLET | Freq: Two times a day (BID) | ORAL | 2 refills | Status: DC

## 2023-11-25 ENCOUNTER — Ambulatory Visit (INDEPENDENT_AMBULATORY_CARE_PROVIDER_SITE_OTHER): Admitting: Family Medicine

## 2023-11-25 ENCOUNTER — Other Ambulatory Visit: Payer: Self-pay | Admitting: Family Medicine

## 2023-11-25 ENCOUNTER — Telehealth: Payer: Self-pay | Admitting: Family Medicine

## 2023-11-25 ENCOUNTER — Encounter: Payer: Self-pay | Admitting: Family Medicine

## 2023-11-25 VITALS — BP 109/62 | HR 66 | Temp 97.0°F | Ht 64.0 in | Wt 157.0 lb

## 2023-11-25 DIAGNOSIS — M26623 Arthralgia of bilateral temporomandibular joint: Secondary | ICD-10-CM | POA: Diagnosis not present

## 2023-11-25 DIAGNOSIS — Z23 Encounter for immunization: Secondary | ICD-10-CM

## 2023-11-25 DIAGNOSIS — T148XXA Other injury of unspecified body region, initial encounter: Secondary | ICD-10-CM

## 2023-11-25 DIAGNOSIS — M26609 Unspecified temporomandibular joint disorder, unspecified side: Secondary | ICD-10-CM

## 2023-11-25 NOTE — Progress Notes (Signed)
 Subjective:  [name], female    DOB: 1951/01/25, 73 y.o.   MRN: [id number]  Patient Care Team: Joesph Annabella HERO, FNP as PCP - General (Family Medicine) Debera Jayson MATSU, MD as PCP - Cardiology (Cardiology) Waddell Danelle ORN, MD as PCP - Electrophysiology (Cardiology) Georjean Darice HERO, MD as Consulting Physician (Neurology) Golda Claudis PENNER, MD (Inactive) as Consulting Physician (Gastroenterology) Dr Willma Moats Optometrist, [place], OD (Optometry) Eliza Lonni RAMAN, MD (Inactive) as Consulting Physician (Vascular Surgery) Billee Mliss BIRCH, [facility] as Pharmacist (Family Medicine) Miriam Norris, NP as Nurse Practitioner (Cardiology)   Chief Complaint:  Ear Pain (Left ear/)   HPI: ZERIAH Richmond is a 73 y.o. female presenting on 11/25/2023 for Ear Pain (Left ear/)   REBECA Richmond is a 73 year old female who presents with ear pain and suspected TMJ disorder.  She experiences left ear pain, described as shooting up and down the jaw and into the ear. The pain is intense, causing her distress, and she sought medical attention the previous day due to its severity. The pain radiates and she reports clicking in her jaw.  She manages the pain primarily with Tylenol  and is currently taking tramadol  for pain relief. She has been prescribed tizanidine  as a muscle relaxer, which she can take every four hours. Additionally, she is on gabapentin  and lorazepam  for nerve-related issues, and she also takes citalopram .   Her daughter and granddaughter have mentioned having TMJ. She inquires about the possibility of headaches being related to her condition, indicating that the pain may be affecting her quality of life.          Relevant past medical, surgical, family, and social history reviewed and updated as indicated.  Allergies and medications reviewed and updated. Data reviewed: Chart in Epic.   Past Medical History:  Diagnosis Date   Asthma    AVNRT  (AV nodal re-entry tachycardia)    Carotid artery disease    Cirrhosis (HCC)    Diverticulosis of colon (without mention of hemorrhage)    Essential hypertension    Functional ovarian cysts    GERD (gastroesophageal reflux disease)    Hemorrhoids, external, without mention of complication    Hormone replacement therapy (postmenopausal)    IBS (irritable bowel syndrome)    Migraine    Mitral valve disease    Pacemaker    Complete heart block - [employer]   Right knee DJD    Seizures (HCC)    Sleep apnea    TIA (transient ischemic attack)    Type 2 diabetes mellitus (HCC)     Past Surgical History:  Procedure Laterality Date   BIOPSY  09/23/2018   Procedure: BIOPSY;  Surgeon: Golda Claudis PENNER, MD;  [facility]: [facility] ENDO [facility];  Service: Endoscopy;;  duodeneum and stomach   CARDIOVERSION N/A 08/14/2022   Procedure: CARDIOVERSION;  Surgeon: Alvan Ronal BRAVO, MD;  Location: [facility] INVASIVE CV LAB;  Service: Cardiovascular;  Laterality: N/A;   CHOLECYSTECTOMY  1980   ENDARTERECTOMY Right 07/01/2017   Procedure: ENDARTERECTOMY CAROTID RIGHT;  Surgeon: Serene Gaile ORN, MD;  Location: [facility] OR;  Service: Vascular;  Laterality: Right;   ESOPHAGOGASTRODUODENOSCOPY N/A 09/23/2018   Procedure: ESOPHAGOGASTRODUODENOSCOPY (EGD);  Surgeon: Golda Claudis PENNER, MD;  Location: [facility];  Service: Endoscopy;  Laterality: N/A;  2:45-moved to 1:45 per Jenkins   ESOPHAGOGASTRODUODENOSCOPY (EGD) WITH PROPOFOL  N/A 03/07/2021   Procedure: ESOPHAGOGASTRODUODENOSCOPY (EGD) WITH PROPOFOL ;  Surgeon: Golda Claudis PENNER, MD;  Location: [facility]  ENDO SUITE;  Service: Endoscopy;  Laterality: N/A;  12:50   LEFT HEART CATHETERIZATION WITH CORONARY ANGIOGRAM N/A 11/18/2013   Procedure: LEFT HEART CATHETERIZATION WITH CORONARY ANGIOGRAM;  Surgeon: Lonni JONETTA Cash, MD;  Location: [facility] CATH LAB;  Service: Cardiovascular;  Laterality: N/A;   OOPHORECTOMY     2012 Dr. May in [place]   PACEMAKER IMPLANT N/A 12/14/2021   Procedure: PACEMAKER  IMPLANT;  Surgeon: Waddell Danelle ORN, MD;  Location: [facility] INVASIVE CV LAB;  Service: Cardiovascular;  Laterality: N/A;   PATCH ANGIOPLASTY Right 07/01/2017   Procedure: RIGHT CAROTID PATCH ANGIOPLASTY;  Surgeon: Serene Gaile ORN, MD;  Location: [facility] OR;  Service: Vascular;  Laterality: Right;   SVT ABLATION N/A 12/14/2021   Procedure: SVT ABLATION;  Surgeon: Waddell Danelle ORN, MD;  Location: [facility] INVASIVE CV LAB;  Service: Cardiovascular;  Laterality: N/A;   TEE WITHOUT CARDIOVERSION N/A 03/01/2022   Procedure: TRANSESOPHAGEAL ECHOCARDIOGRAM (TEE);  Surgeon: Francyne Headland, MD;  Location: [facility] ENDOSCOPY;  Service: Cardiovascular;  Laterality: N/A;   TEE WITHOUT CARDIOVERSION N/A 08/14/2022   Procedure: TRANSESOPHAGEAL ECHOCARDIOGRAM;  Surgeon: Alvan Ronal BRAVO, MD;  Location: [facility] INVASIVE CV LAB;  Service: Cardiovascular;  Laterality: N/A;   TONSILLECTOMY     TOTAL KNEE ARTHROPLASTY Right 09/02/2012   Procedure: TOTAL KNEE ARTHROPLASTY- right;  Surgeon: Toribio JULIANNA Chancy, MD;  Location: [facility] OR;  Service: Orthopedics;  Laterality: Right;   TUBAL LIGATION     TYMPANOMASTOIDECTOMY Left 04/02/2016   Procedure: LEFT CANAL WALL DOWN TYMPANOMASTOIDECTOMY;  Surgeon: Daniel Moccasin, MD;  Location: [facility];  Service: ENT;  Laterality: Left;   VAGINAL HYSTERECTOMY  1990   WOUND EXPLORATION Right 07/02/2017   Procedure: RIGHT NECK EXPLORATION WITH INTRAOPERATIVE ULTRASOUND;  Surgeon: Eliza Lonni RAMAN, MD;  Location: [facility] OR;  Service: Vascular;  Laterality: Right;    Social History   Socioeconomic History   Marital status: Widowed    Spouse name: Not on file   Number of children: 3   Years of education: Not on file   Highest education level: Not on file  Occupational History   Occupation: retired  Tobacco Use   Smoking status: Never   Smokeless tobacco: Never  Vaping Use   Vaping status: Never Used  Substance and Sexual Activity   Alcohol use: No    Alcohol/week: 0.0 standard drinks of alcohol    Drug use: No   Sexual activity: Not Currently  Other Topics Concern   Not on file  Social History Narrative   Lives alone. Granddaughter lives across the street, her 3 children all live nearby   Social Drivers of Health   Financial Resource Strain: Low Risk  (09/26/2023)   Overall Financial Resource Strain (CARDIA)    Difficulty of Paying Living Expenses: Not hard at all  Food Insecurity: No Food Insecurity (11/11/2023)   Received from [facility]   Hunger Vital Sign    Within the past 12 months, you worried that your food would run out before you got the money to buy more.: Never true    Within the past 12 months, the food you bought just didn't last and you didn't have money to get more.: Never true  Transportation Needs: No Transportation Needs (11/11/2023)   Received from [facility] - Transportation    Lack of Transportation (Medical): No    Lack of Transportation (Non-Medical): No  Physical Activity: Inactive (11/11/2023)   [facility]   Exercise Vital Sign  On average, how many days per week do you engage in moderate to strenuous exercise (like a brisk walk)?: 0 days    On average, how many minutes do you engage in exercise at this level?: 0 min  Stress: Stress Concern Present (11/11/2023)   Received from [facility] - Occupational Stress Questionnaire    Do you feel stress - tense, restless, nervous, or anxious, or unable to sleep at night because your mind is troubled all the time - these days?: Very much  Social Connections: Moderately Isolated (09/26/2023)   Social Connection and Isolation Panel    Frequency of Communication with Friends and Family: More than three times a week    Frequency of Social Gatherings with Friends and Family: More than three times a week    Attends Religious Services: More than 4 times per year    Active Member of [employer] or Organizations: No    Attends Tax inspector Meetings: Never    Marital Status: Widowed  Intimate Partner Violence: Not At Risk (09/26/2023)   Humiliation, Afraid, Rape, and Kick questionnaire    Fear of Current or Ex-Partner: No    Emotionally Abused: No    Physically Abused: No    Sexually Abused: No    Outpatient Encounter Medications as of 11/25/2023  Medication Sig   Accu-Chek Softclix Lancets lancets check blood sugars twice daily DX E11.65   acetaminophen  (TYLENOL ) 500 MG tablet Take 500 mg by mouth daily as needed for mild pain.   amiodarone  (PACERONE ) 200 MG tablet Take 1 tablet (200 mg total) Monday thru Friday, NONE on Saturday and Sunday   Azelastine HCl 0.15 % SOLN    Blood Pressure Monitoring (OMRON 3 SERIES BP MONITOR) DEVI Use as directed   capsaicin  (ZOSTRIX) 0.025 % cream Apply topically 2 (two) times daily.   citalopram  (CELEXA ) 20 MG tablet Take 1 tablet (20 mg total) by mouth daily.   fluticasone  (FLONASE ) 50 MCG/ACT nasal spray Place 2 sprays into both nostrils 2 (two) times daily.   furosemide  (LASIX ) 40 MG tablet take 1 tablet once daily.   gabapentin  (NEURONTIN ) 300 MG capsule Take 300 mg by mouth 3 (three) times daily as needed (neuropathy).   glucose blood (ACCU-CHEK GUIDE) test strip USE AS INSTRUCTED TO TEST BLOOD SUGAR DAILY AS DIRECTED.   lactulose  (CHRONULAC ) 10 GM/15ML solution Take 30 mLs (20 g total) by mouth 3 (three) times daily.   lamoTRIgine  (LAMICTAL ) 200 MG tablet take 1 tablet twice daily   levocetirizine (XYZAL ) 5 MG tablet Take 1 tablet (5 mg total) by mouth every evening.   LORazepam  (ATIVAN ) 0.5 MG tablet Take 1 tablet (0.5 mg total) by mouth 2 (two) times daily as needed for anxiety.   magic mouthwash (lidocaine , diphenhydrAMINE , alum & mag hydroxide) suspension Swish and swallow 5 mLs 3 (three) times daily as needed for mouth pain.   meloxicam  (MOBIC ) 15 MG tablet take 1 tablet (15 MILLIGRAM total) by mouth daily.   metoprolol  tartrate (LOPRESSOR ) 25 MG tablet take 0.5  tablets (12.5 milligram total) by mouth 2 (two) times daily.   montelukast  (SINGULAIR ) 10 MG tablet Take 1 tablet (10 mg total) by mouth at bedtime.   nystatin  (MYCOSTATIN ) 100000 UNIT/ML suspension Take 5 mLs (500,000 Units total) by mouth 4 (four) times daily. Swish and swallow   Olopatadine  HCl 0.2 % SOLN Apply 1 drop to eye daily as needed (watery, itchy eyes).   ondansetron  (ZOFRAN ) 4 MG  tablet take 1 tablet by mouth every 8 hours as needed for nausea and vomiting.   pantoprazole  (PROTONIX ) 40 MG tablet Take 1 tablet (40 mg total) by mouth daily.   potassium chloride  SA (KLOR-CON  M) 20 MEQ tablet take 2 tablets by mouth daily.   sacubitril -valsartan  (ENTRESTO ) 24-26 MG Take 1 tablet by mouth 2 (two) times daily.   tiZANidine  (ZANAFLEX ) 4 MG tablet Take 1 tablet (4 mg total) by mouth every 8 (eight) hours as needed for muscle spasms.   traMADol  (ULTRAM ) 50 MG tablet Take 50 mg by mouth every 6 (six) hours as needed.   traZODone  (DESYREL ) 50 MG tablet Take 1 tablet (50 mg total) by mouth at bedtime as needed for sleep.   warfarin (COUMADIN ) 5 MG tablet Take 1 tablet daily except 1/2 tablet on Tuesdays and Saturdays or as directed   busPIRone  (BUSPAR ) 5 MG tablet Take 1 tablet (5 mg total) by mouth 2 (two) times daily.   No facility-administered encounter medications on file as of 11/25/2023.    No Known Allergies  Pertinent ROS per HPI, otherwise unremarkable      Objective:  BP 109/62   Pulse 66   Temp (!) 97 F (36.1 C) (Temporal)   Ht 5' 4 (1.626 m)   Wt 157 lb (71.2 kg)   SpO2 100%   BMI 26.95 kg/m    Wt Readings from Last 3 Encounters:  11/25/23 157 lb (71.2 kg)  11/03/23 154 lb 12.8 oz (70.2 kg)  10/08/23 156 lb (70.8 kg)    Physical Exam Vitals and nursing note reviewed.  Constitutional:      General: She is not in acute distress.    Appearance: She is not ill-appearing, toxic-appearing or diaphoretic.  HENT:     Head: Normocephalic and atraumatic.     Jaw:  Trismus, tenderness and pain on movement present.     Comments: Clicking of bilateral TMJ, left worse than right    Right Ear: Hearing, tympanic membrane, ear canal and external ear normal.     Left Ear: Hearing, tympanic membrane, ear canal and external ear normal.     Nose: No nasal deformity, septal deviation, signs of injury, laceration, nasal tenderness, mucosal edema, congestion or rhinorrhea.     Right Turbinates: Not enlarged, swollen or pale.     Left Turbinates: Not enlarged, swollen or pale.     Comments: Abrasion in left nostril    Mouth/Throat:     Lips: Pink.     Mouth: Mucous membranes are moist.     Dentition: Has dentures.     Pharynx: Oropharynx is clear. Uvula midline.  Eyes:     Conjunctiva/sclera: Conjunctivae normal.     Pupils: Pupils are equal, round, and reactive to light.  Cardiovascular:     Rate and Rhythm: Normal rate and regular rhythm.     Heart sounds: Murmur heard.  Pulmonary:     Effort: Pulmonary effort is normal.     Breath sounds: Normal breath sounds.  Musculoskeletal:     Cervical back: Neck supple.  Lymphadenopathy:     Cervical: No cervical adenopathy.  Skin:    General: Skin is warm and dry.     Capillary Refill: Capillary refill takes less than 2 seconds.  Neurological:     General: No focal deficit present.     Mental Status: She is alert. Mental status is at baseline.     Gait: Gait abnormal (using cane).  Psychiatric:  Mood and Affect: Mood normal.        Behavior: Behavior normal.      Results for orders placed or performed in visit on 11/06/23  Potassium   Collection Time: 11/06/23  2:45 PM  Result Value Ref Range   Potassium 4.8 3.5 - 5.2 mmol/L   *Note: Due to a large number of results and/or encounters for the requested time period, some results have not been displayed. A complete set of results can be found in Results Review.       Pertinent labs & imaging results that were available during my care of the  patient were reviewed by me and considered in my medical decision making.  Assessment & Plan:  Jelitza was seen today for ear pain.  Diagnoses and all orders for this visit:  Bilateral temporomandibular joint pain Offered referral, pt declined. Aware to continue current medications.   Abrasion Left nostril, discussed symptomatic care. Report new or worsening symptoms.    Influenza vaccination administered     Temporomandibular joint disorder (TMJ), bilateral Bilateral TMJ disorder with pain radiating to the ear and jaw, accompanied by clicking and bilateral pain. She is on tramadol , tizanidine , gabapentin , and lorazepam , which should address the TMJ symptoms. She is hesitant to see a specialist for further intervention. - Continue tramadol , tizanidine , gabapentin , and lorazepam . - Provide printout of self-care measures, including chewing soft foods and avoiding teeth grinding. - Advise to report if symptoms worsen or persist, at which point a referral to an oral surgeon may be necessary. - Discuss potential specialist interventions such as Botox injections for muscle relaxation and bite block from an oral surgeon, which are not available in this office.          Continue all other maintenance medications.  Follow up plan: Return if symptoms worsen or fail to improve.   Continue healthy lifestyle choices, including diet (rich in fruits, vegetables, and lean proteins, and low in salt and simple carbohydrates) and exercise (at least 30 minutes of moderate physical activity daily).  Educational handout given for TMJ  The above assessment and management plan was discussed with the patient. The patient verbalized understanding of and has agreed to the management plan. Patient is aware to call the clinic if they develop any new symptoms or if symptoms persist or worsen. Patient is aware when to return to the clinic for a follow-up visit. Patient educated on when it is appropriate to go  to the emergency department.   Rosaline Bruns, FNP-C [facility] [phone]

## 2023-11-25 NOTE — Telephone Encounter (Signed)
 Copied from CRM #[id number]. Topic: Clinical - Medication Question >> Nov 25, 2023  2:17 PM Gustabo D wrote: Wants to speak with her pcp nurse about a prescription she's taking

## 2023-11-26 NOTE — Telephone Encounter (Signed)
FYI FOR PCP

## 2023-11-26 NOTE — Telephone Encounter (Signed)
 R/C to [facility]

## 2023-11-26 NOTE — Telephone Encounter (Signed)
 Called and spoke with patient. Just wanted to let PCP know that she was going to d/c taking TIZANIDINE  Rx because she says she took it yesterday and it made her feel dizzy so she doesn't want to take it anymore.

## 2023-12-03 ENCOUNTER — Encounter (INDEPENDENT_AMBULATORY_CARE_PROVIDER_SITE_OTHER): Payer: Self-pay | Admitting: Gastroenterology

## 2023-12-08 ENCOUNTER — Encounter (HOSPITAL_COMMUNITY)

## 2023-12-08 ENCOUNTER — Ambulatory Visit

## 2023-12-08 ENCOUNTER — Encounter

## 2023-12-09 ENCOUNTER — Ambulatory Visit: Attending: Cardiology | Admitting: *Deleted

## 2023-12-09 DIAGNOSIS — I4891 Unspecified atrial fibrillation: Secondary | ICD-10-CM | POA: Diagnosis not present

## 2023-12-09 DIAGNOSIS — Z5181 Encounter for therapeutic drug level monitoring: Secondary | ICD-10-CM | POA: Diagnosis not present

## 2023-12-09 LAB — POCT INR: INR: 2.6 (ref 2.0–3.0)

## 2023-12-09 NOTE — Patient Instructions (Signed)
 Continue warfarin 1 tablet daily except 1/2 tablet on Tuesdays and Saturdays Recheck in 6 wks

## 2023-12-09 NOTE — Progress Notes (Signed)
 INR 2.6; Please see anticoagulation encounter

## 2023-12-10 ENCOUNTER — Ambulatory Visit: Attending: Nurse Practitioner

## 2023-12-10 DIAGNOSIS — I050 Rheumatic mitral stenosis: Secondary | ICD-10-CM

## 2023-12-12 ENCOUNTER — Ambulatory Visit: Admitting: Family Medicine

## 2023-12-13 LAB — ECHOCARDIOGRAM COMPLETE
AR max vel: 2.69 cm2
AV Area VTI: 2.91 cm2
AV Area mean vel: 2.77 cm2
AV Mean grad: 5 mmHg
AV Peak grad: 8.6 mmHg
AV Vena cont: 0.7 cm
Ao pk vel: 1.47 m/s
Area-P 1/2: 1.11 cm2
Calc EF: 53.4 %
MV VTI: 1.13 cm2
P 1/2 time: 467 ms
S' Lateral: 3.8 cm
Single Plane A2C EF: 55.2 %
Single Plane A4C EF: 50.7 %

## 2023-12-15 ENCOUNTER — Ambulatory Visit (INDEPENDENT_AMBULATORY_CARE_PROVIDER_SITE_OTHER): Payer: Medicare HMO

## 2023-12-15 DIAGNOSIS — I4891 Unspecified atrial fibrillation: Secondary | ICD-10-CM | POA: Diagnosis not present

## 2023-12-16 LAB — CUP PACEART REMOTE DEVICE CHECK
Battery Remaining Longevity: 147 mo
Battery Voltage: 3.04 V
Brady Statistic AP VP Percent: 0.3 %
Brady Statistic AP VS Percent: 53.49 %
Brady Statistic AS VP Percent: 0.02 %
Brady Statistic AS VS Percent: 46.19 %
Brady Statistic RA Percent Paced: 53.86 %
Brady Statistic RV Percent Paced: 0.32 %
Date Time Interrogation Session: 20251027035903
Implantable Lead Connection Status: 753985
Implantable Lead Connection Status: 753985
Implantable Lead Implant Date: 20231027
Implantable Lead Implant Date: 20231027
Implantable Lead Location: 753859
Implantable Lead Location: 753860
Implantable Lead Model: 3830
Implantable Lead Model: 5076
Implantable Pulse Generator Implant Date: 20231027
Lead Channel Impedance Value: 285 Ohm
Lead Channel Impedance Value: 323 Ohm
Lead Channel Impedance Value: 418 Ohm
Lead Channel Impedance Value: 532 Ohm
Lead Channel Pacing Threshold Amplitude: 0.625 V
Lead Channel Pacing Threshold Amplitude: 0.625 V
Lead Channel Pacing Threshold Pulse Width: 0.4 ms
Lead Channel Pacing Threshold Pulse Width: 0.4 ms
Lead Channel Sensing Intrinsic Amplitude: 12.625 mV
Lead Channel Sensing Intrinsic Amplitude: 12.625 mV
Lead Channel Sensing Intrinsic Amplitude: 2 mV
Lead Channel Sensing Intrinsic Amplitude: 2 mV
Lead Channel Setting Pacing Amplitude: 2 V
Lead Channel Setting Pacing Amplitude: 2.5 V
Lead Channel Setting Pacing Pulse Width: 0.4 ms
Lead Channel Setting Sensing Sensitivity: 1.2 mV
Zone Setting Status: 755011

## 2023-12-17 ENCOUNTER — Encounter: Payer: Self-pay | Admitting: Family Medicine

## 2023-12-17 ENCOUNTER — Ambulatory Visit: Admitting: Family Medicine

## 2023-12-17 VITALS — BP 123/61 | HR 63 | Temp 97.6°F | Ht 64.0 in | Wt 158.8 lb

## 2023-12-17 DIAGNOSIS — S51012A Laceration without foreign body of left elbow, initial encounter: Secondary | ICD-10-CM

## 2023-12-17 DIAGNOSIS — M26609 Unspecified temporomandibular joint disorder, unspecified side: Secondary | ICD-10-CM | POA: Diagnosis not present

## 2023-12-17 DIAGNOSIS — E1159 Type 2 diabetes mellitus with other circulatory complications: Secondary | ICD-10-CM | POA: Diagnosis not present

## 2023-12-17 DIAGNOSIS — H9203 Otalgia, bilateral: Secondary | ICD-10-CM

## 2023-12-17 DIAGNOSIS — I152 Hypertension secondary to endocrine disorders: Secondary | ICD-10-CM | POA: Diagnosis not present

## 2023-12-17 NOTE — Progress Notes (Signed)
 Acute Office Visit  Subjective:     [name], female    DOB: March 16, 1950, 73 y.o.   MRN: [id number]  Chief Complaint  Patient presents with   Temporomandibular Joint Pain    HPI  History of Present Illness   Carol Richmond is a 73 year old female who presents with left ear pain and a nasal sore.  Left otalgia - Persistent pain in the left ear, described as 'hurting all the time'  - Pain intensity significant enough to cause distress, with crying upon waking due to pain - No use of ear drops - No insertion of objects into the ear  Nasal lesion - Irritating sore in the nose - Occasional bleeding from the nasal sore - No recent cold symptoms - No increase in nasal discharge beyond usual baseline  Traumatic skin tear - Recent fall while stepping off a stool - Skin tear on the arm - No significant pain from the wound - No drainage       ROS As per HPI.      Objective:    BP 123/61   Pulse 63   Temp 97.6 F (36.4 C) (Temporal)   Ht 5' 4 (1.626 m)   Wt 158 lb 12.8 oz (72 kg)   SpO2 100%   BMI 27.26 kg/m    Physical Exam Vitals and nursing note reviewed.  Constitutional:      General: She is not in acute distress.    Appearance: She is not ill-appearing, toxic-appearing or diaphoretic.  HENT:     Head:     Jaw: Trismus and tenderness present.     Right Ear: Tympanic membrane, ear canal and external ear normal.     Left Ear: Tympanic membrane, ear canal and external ear normal.     Nose:     Comments: Abrasion to right nostril. No active bleeding. No drainage.  Cardiovascular:     Rate and Rhythm: Normal rate and regular rhythm.     Heart sounds: Murmur heard.     Systolic murmur is present.  Pulmonary:     Effort: Pulmonary effort is normal. No respiratory distress.     Breath sounds: Normal breath sounds. No wheezing, rhonchi or rales.  Musculoskeletal:     Right lower leg: No edema.     Left lower leg: No edema.   Skin:    General: Skin is warm and dry.     Findings: Abrasion (shallow skin tear to left elbow. No signs of infection) present.  Neurological:     Mental Status: She is alert and oriented to person, place, and time. Mental status is at baseline.     No results found for any visits on 12/17/23.      Assessment & Plan:   Natavia was seen today for temporomandibular joint pain.  Diagnoses and all orders for this visit:  Otalgia, bilateral  TMJ (temporomandibular joint disorder)  Skin tear of left elbow without complication, initial encounter  HTN assoc with DM  Assessment and Plan    Bilateral ear pain TMJ Normal ear exam. Discussed referred pain due to TMJ. Continue TMJ treatment.   Skin tear of left elbow No signs of infection - Apply hydrocolloid bandage for one week.  Nasal vestibule sore Sore with occasional bleeding. - Discussed humidifier, nasal saline     HTN BP at goal.   Return if symptoms worsen or fail to improve.  The patient indicates understanding of these issues  and agrees with the plan.  Carol Richmond Search, FNP

## 2023-12-18 ENCOUNTER — Ambulatory Visit: Payer: Self-pay | Admitting: Internal Medicine

## 2023-12-18 ENCOUNTER — Telehealth: Payer: Self-pay | Admitting: Family Medicine

## 2023-12-18 ENCOUNTER — Ambulatory Visit: Admitting: Family Medicine

## 2023-12-18 ENCOUNTER — Ambulatory Visit: Payer: Self-pay

## 2023-12-18 NOTE — Telephone Encounter (Signed)
 Pt had multiple calls open regarding this, please see other telephone encounter as this was addressed.

## 2023-12-18 NOTE — Telephone Encounter (Signed)
 Copied from CRM [id number]. Topic: Clinical - Medical Advice >> Dec 18, 2023  2:44 PM Delon DASEN wrote: Reason for CRM: Patient wants to know how long to have the patch on, please call [phone]

## 2023-12-18 NOTE — Telephone Encounter (Signed)
 Routing information to PCP for review and response.    FYI Only or Action Required?: Action required by provider: clinical question for provider.  Patient was last seen in primary care on 12/17/2023 by Joesph Annabella HERO, FNP.   Reason for Triage: Pt was in the office yesterday for a cut on her arm and given a hydrocolloid bandage and was told to leave it on for a week. PT wants to know if she can shower with the patch.

## 2023-12-18 NOTE — Telephone Encounter (Signed)
 I spoke to pt and she wanted to know if she could shower with the colloid dressing on her elbow. I advised pt it is ok to shower with it on and pt voiced understanding.

## 2023-12-18 NOTE — Telephone Encounter (Signed)
 Cell vmail is full. LM on home number for pt to return call.  The bandages she has are waterproof. She just should not submerge herself in a tub or have constant water  spray on the site.

## 2023-12-22 ENCOUNTER — Telehealth: Payer: Self-pay | Admitting: Family Medicine

## 2023-12-22 NOTE — Telephone Encounter (Signed)
 Copied from CRM #[id number]. Topic: Clinical - Medical Advice >> Dec 22, 2023  8:04 AM Cherylann RAMAN wrote: Reason for CRM: Patient is seeking medical advise on intermittent cough that comes and goes. She is taking Mucinex  and would like to know if this is okay as she is not coughing at the moment that she has taken the OTC medication. She is requesting a call from [place] FNP Tiffany Morgan's CMA. Patient can be reached at [phone]. >> Dec 22, 2023 10:05 AM Vivian Z wrote: Patient is calling to check if there is a specific time she will receive the callback. I informed her that it will be before the end of day today but no specific time due to seeing patients in clinic.

## 2023-12-22 NOTE — Telephone Encounter (Signed)
 I spoke to pt and she states she had a cough 2 days ago and it is somewhat better now but she is just hoarse. Advised pt to continue with otc symptomatic care and she can try throat lozenges, hot tea, honey etc and pt voiced understanding and will call for appt if fever or worsening symptoms occur.

## 2023-12-23 ENCOUNTER — Ambulatory Visit (INDEPENDENT_AMBULATORY_CARE_PROVIDER_SITE_OTHER): Admitting: Family

## 2023-12-23 ENCOUNTER — Ambulatory Visit: Payer: Self-pay

## 2023-12-23 ENCOUNTER — Encounter: Payer: Self-pay | Admitting: Family

## 2023-12-23 VITALS — BP 107/64 | HR 66 | Temp 97.6°F | Ht 64.0 in | Wt 159.8 lb

## 2023-12-23 DIAGNOSIS — J069 Acute upper respiratory infection, unspecified: Secondary | ICD-10-CM | POA: Diagnosis not present

## 2023-12-23 MED ORDER — BENZONATATE 200 MG PO CAPS: 200.0000 mg | ORAL_CAPSULE | Freq: Two times a day (BID) | ORAL | 0 refills | Status: DC | PRN

## 2023-12-23 MED ORDER — FLUTICASONE PROPIONATE 50 MCG/ACT NA SUSP: 2.0000 | Freq: Two times a day (BID) | NASAL | 2 refills | Status: AC

## 2023-12-23 MED ORDER — LEVOCETIRIZINE DIHYDROCHLORIDE 5 MG PO TABS: 5.0000 mg | ORAL_TABLET | Freq: Every evening | ORAL | 3 refills | Status: AC

## 2023-12-23 NOTE — Progress Notes (Signed)
 Subjective:    [name], female    DOB: Feb 23, 1950, 73 y.o.   MRN: [id number]  Chief Complaint  Patient presents with   Cough    CONGESTION COMES AND GOES  SINCE SUNDAY TAKEN MUCENIX AND COUGH MEDS    Pt presents to the office today with cough and congestion that started two days ago.  Cough Associated symptoms include headaches and rhinorrhea. Pertinent negatives include no ear pain or sore throat.  URI  This is a new problem. The current episode started in the past 7 days. The problem has been unchanged. There has been no fever. Associated symptoms include congestion, coughing, headaches and rhinorrhea. Pertinent negatives include no ear pain, joint pain, joint swelling, sinus pain, sneezing or sore throat. She has tried decongestant for the symptoms. The treatment provided mild relief.      Review of Systems  HENT:  Positive for congestion and rhinorrhea. Negative for ear pain, sinus pain, sneezing and sore throat.   Respiratory:  Positive for cough.   Musculoskeletal:  Negative for joint pain.  Neurological:  Positive for headaches.  All other systems reviewed and are negative.   Social History   Socioeconomic History   Marital status: Widowed    Spouse name: Not on file   Number of children: 3   Years of education: Not on file   Highest education level: Not on file  Occupational History   Occupation: retired  Tobacco Use   Smoking status: Never   Smokeless tobacco: Never  Vaping Use   Vaping status: Never Used  Substance and Sexual Activity   Alcohol use: No    Alcohol/week: 0.0 standard drinks of alcohol   Drug use: No   Sexual activity: Not Currently  Other Topics Concern   Not on file  Social History Narrative   Lives alone. Granddaughter lives across the street, her 3 children all live nearby   Social Drivers of Health   Financial Resource Strain: Low Risk  (09/26/2023)   Overall Financial Resource Strain (CARDIA)    Difficulty of  Paying Living Expenses: Not hard at all  Food Insecurity: No Food Insecurity (11/11/2023)   Received from [facility]   Hunger Vital Sign    Within the past 12 months, you worried that your food would run out before you got the money to buy more.: Never true    Within the past 12 months, the food you bought just didn't last and you didn't have money to get more.: Never true  Transportation Needs: No Transportation Needs (11/11/2023)   Received from [facility] - Transportation    Lack of Transportation (Medical): No    Lack of Transportation (Non-Medical): No  Physical Activity: Inactive (11/11/2023)   Received from [facility]   Exercise Vital Sign    On average, how many days per week do you engage in moderate to strenuous exercise (like a brisk walk)?: 0 days    On average, how many minutes do you engage in exercise at this level?: 0 min  Stress: Stress Concern Present (11/11/2023)   Received from [facility] - Occupational Stress Questionnaire    Do you feel stress - tense, restless, nervous, or anxious, or unable to sleep at night because your mind is troubled all the time - these days?: Very much  Social Connections: Moderately Isolated (09/26/2023)   Social Connection and Isolation Panel  Frequency of Communication with Friends and Family: More than three times a week    Frequency of Social Gatherings with Friends and Family: More than three times a week    Attends Religious Services: More than 4 times per year    Active Member of [employer] or Organizations: No    Attends Banker Meetings: Never    Marital Status: Widowed   Family History  Problem Relation Age of Onset   Heart disease Mother    Diabetes Mother    Heart disease Brother    Cancer Sister    Heart disease Brother    Heart disease Brother    Heart disease Son         Objective:   Physical Exam Vitals reviewed.  Constitutional:       General: She is not in acute distress.    Appearance: She is well-developed.  HENT:     Head: Normocephalic and atraumatic. Head contusion: hoarse voice.     Right Ear: Tympanic membrane normal.     Left Ear: Tympanic membrane normal.  Eyes:     Pupils: Pupils are equal, round, and reactive to light.  Neck:     Thyroid : No thyromegaly.  Cardiovascular:     Rate and Rhythm: Normal rate and regular rhythm.     Heart sounds: Normal heart sounds. No murmur heard. Pulmonary:     Effort: Pulmonary effort is normal. No respiratory distress.     Breath sounds: Normal breath sounds. No wheezing.     Comments: Dry cough Abdominal:     General: Bowel sounds are normal. There is no distension.     Palpations: Abdomen is soft.     Tenderness: There is no abdominal tenderness.  Musculoskeletal:        General: No tenderness. Normal range of motion.     Cervical back: Normal range of motion and neck supple.  Skin:    General: Skin is warm and dry.     Coloration: Skin is pale.  Neurological:     Mental Status: She is alert and oriented to person, place, and time.     Cranial Nerves: No cranial nerve deficit.     Motor: Weakness present.     Deep Tendon Reflexes: Reflexes are normal and symmetric.  Psychiatric:        Behavior: Behavior normal.        Thought Content: Thought content normal.        Judgment: Judgment normal.       BP 107/64   Pulse 66   Temp 97.6 F (36.4 C) (Temporal)   Ht 5' 4 (1.626 m)   Wt 159 lb 12.8 oz (72.5 kg)   SpO2 97%   BMI 27.43 kg/m      Assessment & Plan:  Carol Richmond comes in today with chief complaint of Cough (CONGESTION COMES AND GOES  SINCE SUNDAY TAKEN MUCENIX AND COUGH MEDS )   Diagnosis and orders addressed:  1. Viral URI (Primary) - Take meds as prescribed - Use a cool mist humidifier  -Use saline nose sprays frequently -Force fluids -For any cough or congestion  Use plain Mucinex - regular strength or max strength is  fine -For fever or aces or pains- take tylenol  or ibuprofen. -Throat lozenges if help -Follow up if symptoms worsen or do not improve  Call the office Monday if symptoms worsen or do not improve and I will send in antibiotic. Continue Xyzal  and start Flonase , tessalon   -  levocetirizine (XYZAL ) 5 MG tablet; Take 1 tablet (5 mg total) by mouth every evening.  Dispense: 90 tablet; Refill: 3 - fluticasone  (FLONASE ) 50 MCG/ACT nasal spray; Place 2 sprays into both nostrils 2 (two) times daily.  Dispense: 15.8 mL; Refill: 2 - benzonatate  (TESSALON ) 200 MG capsule; Take 1 capsule (200 mg total) by mouth 2 (two) times daily as needed for cough.  Dispense: 20 capsule; Refill: 0      Bari Learn, FNP

## 2023-12-23 NOTE — Telephone Encounter (Signed)
 FYI Only or Action Required?: FYI only for provider: appointment scheduled on 12/23/23.  Patient was last seen in primary care on 12/17/2023 by Joesph Annabella HERO, FNP.  Called Nurse Triage reporting Hoarse.  Symptoms began several days ago.  Interventions attempted: OTC medications: mucinex .  Symptoms are: unchanged.  Triage Disposition: See PCP Within 2 Weeks  Patient/caregiver understands and will follow disposition?: Yes  Copied from CRM [id number]. Topic: Clinical - Medical Advice >> Dec 23, 2023  8:52 AM Avram MATSU wrote: Reason for CRM: patient is calling for medical advice, she's currently is taking mucinex  and wanted to know he she can still talk more to help her cough. Please advise [phone]     ----------------------------------------------------------------------- From previous Reason for Contact - Scheduling: Patient/patient representative is calling to schedule an appointment. Refer to attachments for appointment information. Reason for Disposition  [1] Nasal allergies AND [2] only certain times of year AND [3] hay fever diagnosis has never been confirmed by a doctor (or NP/PA)  Answer Assessment - Initial Assessment Questions 1. DESCRIPTION: Describe your voice. (e.g., coarse, raspy, weaker, airy, scratchy, deeper)     course 2. SEVERITY: How bad is it?     mild 3. ONSET: When did the hoarseness begin?     4 days  4. COUGH: Is there a cough? If Yes, ask: How bad is it?     denies 5. FEVER: Do you have a fever? If Yes, ask: What is your temperature, how was it measured, and when did it start?     denies 6. ALLERGIES: Do you have any allergy symptoms? If Yes, ask: What are they? (e.g., nose stuffiness)     congestion 7. IRRITANTS: Do you smoke? Have you been exposed to any irritating fumes? (e.g., smoke)     denies 8. CAUSE: What do you think is causing the hoarseness?     Allergies  9. OTHER SYMPTOMS: Do you have any other symptoms?  (e.g., breathing difficulty, fever, foreign body, lymph node swelling in neck, rash, sore throat, swallowing difficulty, weight loss)     Not sick denies all other symptoms 10. PREGNANCY: Is there any chance you are pregnant? When was your last menstrual period?  Answer Assessment - Initial Assessment Questions Additional info: 1) She has not been taking her allergy medication-Xyzal . She will a dose now.  2) Patient has acute visit scheduled today prior to triage, appointment appropriate.    1. SYMPTOM: What's the main symptom you're concerned about? (e.g., runny nose, stuffiness, sneezing, itching)     Horse voice, congestion 2. SEVERITY: How bad is it? What does it keep you from doing? (e.g., sleeping, working)      Not keeping her from anything 3. EYES: Are your eyes also red, watery, and itchy?      denies 4. TRIGGER: What pollen or other allergic substance do you think is causing the symptoms?      Seasonal allergies-happens each year this time 5. TREATMENT: What medicine are you using? What medicine worked best in the past?     Mucinex   6. OTHER SYMPTOMS: Do you have any other symptoms? (e.g., coughing, difficulty breathing, wheezing)     Denies all other symptoms  Protocols used: Hoarseness-A-AH, Nasal Allergies (Hay Fever)-A-AH

## 2023-12-23 NOTE — Patient Instructions (Signed)

## 2023-12-23 NOTE — Progress Notes (Signed)
 Remote PPM Transmission

## 2023-12-24 ENCOUNTER — Telehealth: Payer: Self-pay | Admitting: Family Medicine

## 2023-12-24 NOTE — Telephone Encounter (Signed)
 Copied from CRM [id number]. Topic: Clinical - Medical Advice >> Dec 24, 2023  8:04 AM Mia F wrote: Reason for CRM: Pt says she was in yesterday and was told she has a viral infection but she says she does not believe that she has it. She says she does not understand why she got a cough medicine when she is not coughing and she also wants to know if she is contagious and if she could go to church on Sunday.

## 2023-12-25 ENCOUNTER — Telehealth: Payer: Self-pay | Admitting: Family Medicine

## 2023-12-25 NOTE — Telephone Encounter (Signed)
 As discussed during visit multiple times, her symptoms more than likely viral. She told me she had a cough for two days and asked if she wanted cough medication. This was sent to pharmacy. If she no long needs cough medication she does not need to pick up. I would use OTC zyrtec  and flonase .   If her symptoms continue or worsen by Monday or Tuesday she can call and I will send her in an antibiotic.  She is safe to go to church as long as she is fever free.

## 2023-12-25 NOTE — Telephone Encounter (Unsigned)
 Copied from CRM [id number]. Topic: Clinical - Medical Advice >> Dec 24, 2023  8:04 AM Mia F wrote: Reason for CRM: Pt says she was in yesterday and was told she has a viral infection but she says she does not believe that she has it. She says she does not understand why she got a cough medicine when she is not coughing and she also wants to know if she is contagious and if she could go to church on Sunday. >> Dec 25, 2023  8:49 AM Miquel SAILOR wrote: PT is still not feeling better had app for 11/04 instructed by PCP is she does not feel better to call back to request ZPAP. Needs call back when updated [phone] or [phone]

## 2023-12-25 NOTE — Telephone Encounter (Signed)
 Patient states she has been doing all this since Monday. She wants the zpack sent in.

## 2023-12-25 NOTE — Telephone Encounter (Unsigned)
 Copied from CRM [id number]. Topic: Clinical - Medical Advice >> Dec 24, 2023  8:04 AM Mia F wrote: Reason for CRM: Pt says she was in yesterday and was told she has a viral infection but she says she does not believe that she has it. She says she does not understand why she got a cough medicine when she is not coughing and she also wants to know if she is contagious and if she could go to church on Sunday. >> Dec 25, 2023  2:46 PM Susanna ORN wrote: Patient called stating that she was told that she would have Z-pak medication sent in to [facility] and that it wouldn't be a problem getting it. She's still waiting. Please give patient a call once this medication has been sent to pharmacy.  >> Dec 25, 2023  8:49 AM Miquel SAILOR wrote: PT is still not feeling better had app for 11/04 instructed by PCP is she does not feel better to call back to request ZPAP. Needs call back when updated [phone] or [phone]

## 2023-12-25 NOTE — Telephone Encounter (Signed)
 Patient is calling to check status of Z-pack prescription. Transferred to clinic.

## 2023-12-26 NOTE — Telephone Encounter (Signed)
 As discussed earlier, if symptoms continue til Monday I will send in antibiotic for her. Continue OTC medications.

## 2023-12-26 NOTE — Telephone Encounter (Signed)
 Pt says she been coughing and does not want to wait until Monday. She says she will call Monday morning if no better.

## 2023-12-26 NOTE — Telephone Encounter (Unsigned)
 Copied from CRM [id number]. Topic: Clinical - Medical Advice >> Dec 24, 2023  8:04 AM Mia F wrote: Reason for CRM: Pt says she was in yesterday and was told she has a viral infection but she says she does not believe that she has it. She says she does not understand why she got a cough medicine when she is not coughing and she also wants to know if she is contagious and if she could go to church on Sunday. >> Dec 26, 2023  9:20 AM Graeme ORN wrote: Patient called back. States she was told by Nurse it would be sent in yesterday. Provider message from Chritsy from this morning that says Monday. Patient is not satisfied with that response. Would like for Christy or Tiffany nurse to call her when they can. Thank You  >> Dec 25, 2023  2:46 PM Susanna ORN wrote: Patient called stating that she was told that she would have Z-pak medication sent in to [facility] and that it wouldn't be a problem getting it. She's still waiting. Please give patient a call once this medication has been sent to pharmacy.  >> Dec 25, 2023  8:49 AM Miquel SAILOR wrote: PT is still not feeling better had app for 11/04 instructed by PCP is she does not feel better to call back to request ZPAP. Needs call back when updated [phone] or [phone]

## 2023-12-26 NOTE — Telephone Encounter (Signed)
 Called patient she did not pick up and could not leave a voice mail. She can make appt with pcp if she would like provider states that she is not going to send in the z-pack today to give it till Monday to see how she is feeling.

## 2023-12-29 ENCOUNTER — Telehealth: Payer: Self-pay | Admitting: Family Medicine

## 2023-12-29 MED ORDER — AMOXICILLIN-POT CLAVULANATE 875-125 MG PO TABS: 1.0000 | ORAL_TABLET | Freq: Two times a day (BID) | ORAL | 0 refills | Status: DC

## 2023-12-29 NOTE — Addendum Note (Signed)
 Addended by: LAVELL LYE A on: 12/29/2023 08:55 AM   Modules accepted: Orders

## 2023-12-29 NOTE — Telephone Encounter (Signed)
 Augmentin  Prescription sent to pharmacy for sinus infection. This antibiotic should effect your warfarin less than the Zpak.

## 2023-12-29 NOTE — Telephone Encounter (Signed)
 Called to let patient know provider sent in medication call was dropped please inform when patient calls back.

## 2023-12-29 NOTE — Telephone Encounter (Signed)
 Copied from CRM [id number]. Topic: Clinical - Medical Advice >> Dec 29, 2023  8:07 AM Carol Richmond wrote: Reason for CRM: Patient calling back in was told if she wasn't feeling any better by the weekend to call back in she could be prescribe a zpack, patient is requesting for that to be sent to the pharmacy .  Would like a callback regarding this  [phone]

## 2023-12-31 ENCOUNTER — Ambulatory Visit: Payer: Self-pay

## 2023-12-31 NOTE — Telephone Encounter (Signed)
 FYI Only or Action Required?: Action required by provider: Pt thinks she may be having reaction to Augmentin , asking if Z-Pack can be prescribed.  Patient was last seen in primary care on 12/23/2023 by Lavell Bari LABOR, FNP.  Called Nurse Triage reporting Medication Reaction.  Symptoms began yesterday.  Interventions attempted: Nothing.  Symptoms are: stable.  Triage Disposition: Call PCP Now  Patient/caregiver understands and will follow disposition?: Yes   CRM # [id number]  Reason for CRM: Patient states she was prescribed amoxicillin -clavulanate (AUGMENTIN ) 875-125 MG tablet. Took one last night for the first time and states she felt weird after taking it. Hasn't taking it again. Would like to know what it is for and if she can possibly be prescribed something else. Is requesting a call back from the Provider or a nurse.   Reason for Disposition  [1] Caller has URGENT medicine question about med that primary care doctor (or NP/PA) or specialist prescribed AND [2] triager unable to answer question  Answer Assessment - Initial Assessment Questions Pt reports being seeing provider on 11/04 for URI. Symptoms didn't improved and called a few days later asking to have Z-Pack prescribed. Augmentin  was ordered, pt reports taking first dose last night and feeling fine but not like herself. Reports she's never had issue with augmentin  in the past. Denies CP, SOB, dizziness, rash, swelling of lips tongue or throat, or N/V/D. Unable to describe feeling other than that she felt a little funny. Pt asking if Z-Pack can be called in instead. Let pt know message would be sent to office. Advised pt to continue taking medication given mild symptoms and to call back if symptoms worsen. Advised UC or ED for worsening symptoms. Confirmed pts phone and pharmacy on file are correct.   1. NAME of MEDICINE: What medicine(s) are you calling about?     Augmentin   2. QUESTION: What is your question? (e.g.,  double dose of medicine, side effect)     Pt reports feeling funny after taking first dose of augmentin  last night. Reports She's never had issue with augmentin  in the past.  3. PRESCRIBER: Who prescribed the medicine? Reason: if prescribed by specialist, call should be referred to that group.     Lavell Bari LABOR, FNP  4. SYMPTOMS: Do you have any symptoms? If Yes, ask: What symptoms are you having?  How bad are the symptoms (e.g., mild, moderate, severe)     Pt reports feeling fine but like she didn't feel like (herself)  Protocols used: Medication Question Call-A-AH

## 2024-01-01 ENCOUNTER — Telehealth: Payer: Self-pay | Admitting: Family Medicine

## 2024-01-01 ENCOUNTER — Encounter (INDEPENDENT_AMBULATORY_CARE_PROVIDER_SITE_OTHER): Payer: Self-pay | Admitting: Gastroenterology

## 2024-01-01 NOTE — Telephone Encounter (Signed)
 Copied from CRM [id number]. Topic: General - Other >> Jan 01, 2024  7:43 AM Tobias CROME wrote: Reason for CRM: Patient requesting a call back from Karla. Patient has an additional question to ask but declined to give further information.   Best callback number: [phone]

## 2024-01-01 NOTE — Telephone Encounter (Signed)
 I spoke to pt and advised pt she has taken this medication multiple times in the past and that the zpak interferes with her warfarin and to continue taking the augmentin . Pt voiced understanding.

## 2024-01-01 NOTE — Telephone Encounter (Signed)
 I spoke to pt and she was confused about whether she needed to take her other medications at a different time then the antibiotic. Advised pt to continue to take her medications as prescribed and just add the Augmentin  BID and pt voiced understanding.

## 2024-01-01 NOTE — Telephone Encounter (Unsigned)
 Copied from CRM [id number]. Topic: General - Other >> Jan 01, 2024  3:02 PM Montie POUR wrote: Carol Richmond, please call Carol Richmond at [phone], her cell number. She wants to discuss the medications from note this morning.

## 2024-01-06 ENCOUNTER — Ambulatory Visit: Payer: Self-pay

## 2024-01-06 ENCOUNTER — Telehealth (INDEPENDENT_AMBULATORY_CARE_PROVIDER_SITE_OTHER): Payer: Self-pay

## 2024-01-06 NOTE — Telephone Encounter (Signed)
 I explained all this to the patient. She states understanding.

## 2024-01-06 NOTE — Telephone Encounter (Signed)
 FYI Only or Action Required?: FYI  Patient was last seen in primary care on 12/23/2023 by Lavell Bari LABOR, FNP.  Called Nurse Triage reporting Medication Problem.   Triage Disposition: Information or Advice Only Call  Patient/caregiver understands and will follow disposition?: Yes         Reason for Disposition  General information question, no triage required and triager able to answer question  Answer Assessment - Initial Assessment Questions 1. REASON FOR CALL: What is the main reason for your call? or How can I best help you?     Pt had a mix up of medications and has reached out to pharmacist. Pt reports that pharmacist instructed her to skip AM dosages to get back on track this evening. Additionally, pt unable to confirm current medication hx at this time, so Triager advised pt to listen to Rph instructions and to call back for any other questions.  Triager also advised a pill box for future confusion. 2. SYMPTOMS : Do you have any symptoms?      N/a 3. OTHER QUESTIONS: Do you have any other questions?     Pt has questions about Tylenol  500 mg. Per chart, triager reinforced SIG. Pt reports that GI specialist that prescribed medication said she could take it up to 3x a day. Triager advised that she should reach out to prescribing provider for clarification. Pt states that she has already called them and is awaiting callback.  Protocols used: Information Only Call - No Triage-A-AH

## 2024-01-06 NOTE — Telephone Encounter (Signed)
 Patient called to ask how much Tylenol  if at all she can take per day.  Patient last seen 06/23/202 due to non alcoholic cirrhosis. Per last OV per Mitzie Boettcher, NP, She can take Tylenol  as needed for pain, max of 2000mg  per day (650 mg every 8 hours as needed) for pain. Patient says she has 500 mg tablets on hand.

## 2024-01-09 ENCOUNTER — Ambulatory Visit: Attending: Nurse Practitioner | Admitting: Nurse Practitioner

## 2024-01-09 ENCOUNTER — Encounter: Payer: Self-pay | Admitting: Nurse Practitioner

## 2024-01-09 ENCOUNTER — Telehealth: Payer: Self-pay | Admitting: Pharmacy Technician

## 2024-01-09 ENCOUNTER — Ambulatory Visit: Payer: Self-pay | Admitting: Nurse Practitioner

## 2024-01-09 ENCOUNTER — Other Ambulatory Visit (HOSPITAL_COMMUNITY): Payer: Self-pay

## 2024-01-09 VITALS — BP 116/62 | HR 61 | Ht 64.0 in | Wt 158.0 lb

## 2024-01-09 DIAGNOSIS — R0609 Other forms of dyspnea: Secondary | ICD-10-CM

## 2024-01-09 DIAGNOSIS — I349 Nonrheumatic mitral valve disorder, unspecified: Secondary | ICD-10-CM

## 2024-01-09 DIAGNOSIS — I38 Endocarditis, valve unspecified: Secondary | ICD-10-CM | POA: Diagnosis not present

## 2024-01-09 DIAGNOSIS — R29898 Other symptoms and signs involving the musculoskeletal system: Secondary | ICD-10-CM | POA: Diagnosis not present

## 2024-01-09 DIAGNOSIS — R0602 Shortness of breath: Secondary | ICD-10-CM

## 2024-01-09 DIAGNOSIS — Z950 Presence of cardiac pacemaker: Secondary | ICD-10-CM

## 2024-01-09 DIAGNOSIS — I059 Rheumatic mitral valve disease, unspecified: Secondary | ICD-10-CM

## 2024-01-09 DIAGNOSIS — I4891 Unspecified atrial fibrillation: Secondary | ICD-10-CM

## 2024-01-09 DIAGNOSIS — Z8679 Personal history of other diseases of the circulatory system: Secondary | ICD-10-CM

## 2024-01-09 DIAGNOSIS — I5020 Unspecified systolic (congestive) heart failure: Secondary | ICD-10-CM

## 2024-01-09 DIAGNOSIS — I050 Rheumatic mitral stenosis: Secondary | ICD-10-CM

## 2024-01-09 NOTE — Progress Notes (Unsigned)
 Cardiology Office Note:  .   Date:  07/10/2023 ID:  Carol Richmond, DOB [birth year], MRN [id number] PCP: Joesph Annabella HERO, FNP  [facility] Providers Cardiologist:  Jayson Sierras, MD Cardiology APP:  Miriam Norris, NP  Electrophysiologist:  Danelle Birmingham, MD    History of Present Illness: Carol Richmond is a very pleasant 73 y.o. female with a PMH of rheumatic MV disease, moderate to severe mitral valve stenosis, HFmrEF, AVNRT, s/p RF ablation, OSA, CHB, s/p PPM, HTN, carotid artery disease, T2DM, hx of TIA, NASH cirrhosis, G1 EV, GERD, who presents today for follow-up.   Admitted 07/2022 for A-fib/A-flutter with RVR. Was initiated on Amiodarone , transferred to [facility]. Underwent TEE/DCCV on 08/14/2022. TEE revealed EF 25-30%, severely reduced RV systolic function, severe mitral valve regurgitation with severe mitral stenosis - see full report below.   Seen for follow-up on 10/09/2022 and chief concern was DOE noted for the last couple of weeks, was not bothersome per her report. Denied any chest pain, palpitations, syncope, presyncope, dizziness, orthopnea, PND, swelling or significant weight changes, acute bleeding, or claudication.  Tearful during interview at one point during conversation, admitted to recent stress.  Hospital admission 10/16/22 - 10/21/22 for AKI, UTI, and metabolic encephalopathy. Was noted to have confusion and falls on arrival to ED. CT of head unremarkable. Received tx for UTI. Hospital course noted by supra-therapeutic INR.   12/06/2022 - Today she presents for follow-up. She states she fell again a few weeks ago, denies any acute injuries.  Tearful during interview as she expresses fears about living alone.  Requesting to live with youngest daughter.  Denies any chest pain, shortness of breath, palpitations, syncope, presyncope, dizziness, orthopnea, PND, swelling or significant weight changes, acute bleeding, or claudication.  Expresses some frustration  with current primary care.  Says she will be going to see a neurologist soon.  Says she does not want to undergo colonoscopy/EGD as she has been told this will be high risk due to her heart condition.  She does have concerns regarding open heart surgery, and does not want to proceed with workup for this.   03/11/2023 - Presents today for follow-up.  She tells me she continues to have balance issues as well as falls.  She says most recently, she was preparing materials to help salt the pavement in front of her house due to this now, says she lost her balance and footing and fell and hit her head.  Denies any red flag signs or symptoms, does say she has a knot that is resolving along her forehead.  Says she did not go to the hospital after this fall. Denies any chest pain, shortness of breath, palpitations, syncope, presyncope, dizziness, orthopnea, PND, swelling or significant weight changes, acute bleeding, or claudication.  07/10/2023 -  Here for follow-up. Admits to intermittent, atypical CP recently for one time occurrence, denies any other recent CP. Main chief concern is left ear pain, PCP is aware and managing this. Denies any shortness of breath, palpitations, syncope, presyncope, dizziness, orthopnea, PND, swelling or significant weight changes, acute bleeding, or claudication.  ROS: Negative. See HPI.  Studies Reviewed: .    Echo limited 11/2022: 1. Limited Echo for LVEF assessment.   2. Left ventricular ejection fraction, by estimation, is 45 to 50%. The left ventricle has mildly decreased function. The left ventricle  demonstrates regional wall motion abnormalities (see scoring  diagram/findings for description). Left ventricular  diastolic parameters are consistent with Grade I  diastolic dysfunction  (impaired relaxation). Elevated left ventricular end-diastolic pressure.   3. Right ventricular systolic function is normal. The right ventricular  size is normal. Tricuspid regurgitation  signal is inadequate for assessing  PA pressure.  Carotid duplex 09/2022: Summary:  Right Carotid: Velocities in the right ICA are consistent with a 1-39% stenosis.   Left Carotid: Velocities in the left ICA are consistent with a 1-39%  stenosis.   Vertebrals: Bilateral vertebral arteries demonstrate antegrade flow.  Subclavians: Normal flow hemodynamics were seen in bilateral subclavian arteries.  TEE 08/14/2022:  1. Left ventricular ejection fraction, by estimation, is 25 to 30%. The  left ventricle has severely decreased function. The left ventricle  demonstrates global hypokinesis.   2. Right ventricular systolic function is severely reduced. The right  ventricular size is mildly enlarged.   3. Left atrial size was moderately dilated. No left atrial/left atrial  appendage thrombus was detected.   4. The mitral valve is rheumatic. Severe mitral valve regurgitation.  Severe mitral stenosis.   5. The aortic valve is tricuspid. Aortic valve regurgitation is trivial.  Risk Assessment/Calculations:    CHA2DS2-VASc Score =    This indicates a  % annual risk of stroke. The patient's score is based upon:     The 10-year ASCVD risk score (Arnett DK, et al., 2019) is: 18%   Values used to calculate the score:     Age: 43 years     Clincally relevant sex: Female     Is Non-Hispanic African American: No     Diabetic: Yes     Tobacco smoker: No     Systolic Blood Pressure: 107 mmHg     Is BP treated: Yes     HDL Cholesterol: 73 mg/dL     Total Cholesterol: 218 mg/dL      Physical Exam:   VS:  Ht 5' 4 (1.626 m)   Wt 158 lb (71.7 kg)   BMI 27.12 kg/m    Wt Readings from Last 3 Encounters:  01/09/24 158 lb (71.7 kg)  12/23/23 159 lb 12.8 oz (72.5 kg)  12/17/23 158 lb 12.8 oz (72 kg)    GEN: Well nourished, well developed in no acute distress NECK: No JVD; No carotid bruits CARDIAC: S1/S2, RRR, Grade 2/6 murmur, no rubs, no gallops RESPIRATORY:  Clear to auscultation  without rales, wheezing or rhonchi  ABDOMEN: Soft, non-tender, non-distended EXTREMITIES:  No edema; No deformity, mild tremors noted along bilateral forearms  ASSESSMENT AND PLAN: .    HFmrEF Stage C, NYHA class I-II symptoms. EF mildly reduced at 45-50%. Euvolemic and well compensated on exam. Continue Entresto  and Lopressor  - cannot switch to Coreg  at this time d/t current BP. Continue current GDMT. Low sodium diet, fluid restriction <2L, and daily weights encouraged. Educated to contact our office for weight gain of 2 lbs overnight or 5 lbs in one week. Will update Echo 11/2023.   A-fib/A-flutter, s/p DCCV, AVNRT, s/p ablation Underwent previous successful DCCV. Denies any recent palpitations or tachycardia. Continue Lopressor  and Amiodarone . Past LFT and Thyroid  panel results were WNL. Continue coumadin  and continue to f/u with Coumadin  Clinic. Heart healthy diet and regular cardiovascular exercise encouraged.   Rheumatic mitral valve disease, severe mitral stenosis with severe regurgitation Past TEE revealed mitral valve was not amenable to valvuloplasty due to severity of mitral insufficiency and due to severe/asymmetrical focal calcification of mid anterior leaflet.  Severe MR and mitral valve stenosis noted on TEE 07/2022, with mean gradient at 9.5  mmHg as well as mitral valve was noted to be rheumatic. Continue current medication regimen.  ED precautions discussed. Previously discussed results with her during office visit and discussed treatment options/plan of care. Pt declines surgery or intervention. Will update Echo 11/2023.   CHB, s/p PPM Doing well. Normal device function noted during last device check.  Continue follow-up with Dr. Waddell.  Dispo: Follow-up with me or APP in 4-6 months or sooner if anything changes.   Signed, Almarie Crate, NP

## 2024-01-09 NOTE — Patient Instructions (Addendum)
 Medication Instructions:  Continue all current medications.  Labwork: none  Testing/Procedures: none  Follow-Up: 3 months   Any Other Special Instructions Will Be Listed Below (If Applicable).  If you need a refill on your cardiac medications before your next appointment, please call your pharmacy.

## 2024-01-09 NOTE — Telephone Encounter (Addendum)
   Patient Advocate Encounter   The patient was approved for a [employer] grant that will help cover the cost of entresto  Total amount awarded, [id number].  Effective: 12/10/23 - 12/08/24   APW:[id number] ERW:[id number] Hmnle:[id number] PI:[id number] [employer] ID: [id number]   Pharmacy provided with approval and processing information. Patient informed via mychart

## 2024-01-12 ENCOUNTER — Other Ambulatory Visit: Payer: Self-pay | Admitting: *Deleted

## 2024-01-12 MED ORDER — SACUBITRIL-VALSARTAN 24-26 MG PO TABS: 1.0000 | ORAL_TABLET | Freq: Two times a day (BID) | ORAL | 3 refills | Status: AC

## 2024-01-13 ENCOUNTER — Encounter (INDEPENDENT_AMBULATORY_CARE_PROVIDER_SITE_OTHER): Payer: Self-pay | Admitting: Gastroenterology

## 2024-01-13 ENCOUNTER — Ambulatory Visit (INDEPENDENT_AMBULATORY_CARE_PROVIDER_SITE_OTHER): Admitting: Gastroenterology

## 2024-01-13 VITALS — BP 106/63 | HR 69 | Temp 98.0°F | Ht 64.0 in | Wt 153.9 lb

## 2024-01-13 DIAGNOSIS — K219 Gastro-esophageal reflux disease without esophagitis: Secondary | ICD-10-CM | POA: Diagnosis not present

## 2024-01-13 DIAGNOSIS — K7460 Unspecified cirrhosis of liver: Secondary | ICD-10-CM

## 2024-01-13 NOTE — Patient Instructions (Signed)
-  continue lasix  40mg  daily  -up to date on labs until march 2026 -US  due March  -continue lactulose  30ml, try taking this only twice per day, goal is 2-3 soft Bowel movements per day, if you have any issues, let me know  - Reduce salt intake to <2 g per day - Can take Tylenol  max of 2 g per day (650 mg q8h) for pain - Avoid NSAIDs for pain (discussed not to take ibuprofen given cirrhosis and coumadin ) - Avoid eating raw oysters/shellfish - Ensure every night before going to sleep  Follow up 6 months  It was a pleasure to see you today. I want to create trusting relationships with patients and provide genuine, compassionate, and quality care. I truly value your feedback! please be on the lookout for a survey regarding your visit with me today. I appreciate your input about our visit and your time in completing this!    Dyllan Hughett L. Quron Ruddy, MSN, APRN, AGNP-C Adult-Gerontology Nurse Practitioner [facility] at [facility]

## 2024-01-13 NOTE — Progress Notes (Signed)
 Reminder for labs added to reminder file

## 2024-01-13 NOTE — Progress Notes (Signed)
 US  noted in recall for March 2026

## 2024-01-13 NOTE — Progress Notes (Addendum)
 Referring Provider: Joesph Annabella HERO, FNP Primary Care Physician:  Joesph Annabella HERO, FNP Primary GI Physician: Dr. Eartha   Chief Complaint  Patient presents with   Follow-up    Pt arrives for follow up. Pt does have a couple questions for provider. Pt reports abdominal pain/bloating in epigastric region after eating,especially at night. Has been taking lactulose .    HPI:   Carol Richmond is a 73 y.o. female with past medical history of MASH cirrhosis with G1 EV, GERD, SVT, Afib/A flutter   Patient presenting today for:  Follow up of MASH cirrhosis and GERD  Last seen June, at that time doing well. Taking lactulose  TID, BM 2-3 times per day. Declined any further endoscopic evaluations.   Recommended to continue lasix  40mg  daily, update MELD labs around December, [facility] screening in sept, continue lactulose  30ml TID, repeat AFP in sept  Present:  States that last week she had some epigastric pain, she had not eaten and noted that pain came on for a short period of time. Has not had any recurrence of pain. Appetite is good. Weight is stable. No GERD symptoms regularly, she is maintained on protonix  40mg  daily. She has some nausea at times when she eats.   Taking lactulose  30 ml TID, having 3-4 BMs per day, BMs are more watery now.   She denies ascites or swelling to legs. Denies any episodes of confusion or forgetfulness. She continues to decline any further endoscopic evaluations due to her other comorbidities.   She inquired about safety of taking tylenol  due to what she had seen on the news recently.   Last MELD 3.0: September 20 Cirrhosis related questions: Hematemesis/coffee ground emesis: No Abdominal pain: No Abdominal distention/worsening ascitesNo Fever/chills: No Number of Bms per day - 3-4 times, takes lactulose  3 times a day. Episodes of confusion/disorientation: No Taking diuretics?: yes, furosemide  40 mg qday History of variceal bleeding: No Prior history  of banding?: No Prior episodes of SBP: No -Last time liver imaging was performed:10/2023 Cirrhotic liver morphology without focal hepatic lesion.  Last AFP: 10/2023 2.5 Currently consuming alcohol: No Hepatitis A and B vaccination status: Immune to hepatitis A, states she was vaccinated for hepatitis B in 2023   Last Colonoscopy: 05/2017  normal colonoscopy, recommend repeat in 5 years Last Endoscopy:- 03/07/21 Normal hypopharynx. - Normal proximal esophagus and mid esophagus. - Grade I esophageal varices. - Z-line irregular, 37 cm from the incisors. - Portal hypertensive gastropathy. - Food was found in the stomach. - Erosive gastropathy with no stigmata of recent bleeding(secondary to NSAID; gastric biopsy negative for H. pylori in August 2020) - Normal duodenal bulb and second portion of the duodenum. - No specimens collected.    Past Medical History:  Diagnosis Date   Asthma    AVNRT (AV nodal re-entry tachycardia)    Carotid artery disease    Cirrhosis (HCC)    Diverticulosis of colon (without mention of hemorrhage)    Essential hypertension    Functional ovarian cysts    GERD (gastroesophageal reflux disease)    Hemorrhoids, external, without mention of complication    Hormone replacement therapy (postmenopausal)    IBS (irritable bowel syndrome)    Migraine    Mitral valve disease    Pacemaker    Complete heart block - [employer]   Right knee DJD    Seizures (HCC)    Sleep apnea    TIA (transient ischemic attack)    Type 2 diabetes mellitus (HCC)  Past Surgical History:  Procedure Laterality Date   BIOPSY  09/23/2018   Procedure: BIOPSY;  Surgeon: Golda Claudis PENNER, MD;  Location: [facility];  Service: Endoscopy;;  duodeneum and stomach   CARDIOVERSION N/A 08/14/2022   Procedure: CARDIOVERSION;  Surgeon: Alvan Ronal BRAVO, MD;  Location: [facility] INVASIVE CV LAB;  Service: Cardiovascular;  Laterality: N/A;   CHOLECYSTECTOMY  1980   ENDARTERECTOMY Right 07/01/2017    Procedure: ENDARTERECTOMY CAROTID RIGHT;  Surgeon: Serene Gaile ORN, MD;  Location: [facility] OR;  Service: Vascular;  Laterality: Right;   ESOPHAGOGASTRODUODENOSCOPY N/A 09/23/2018   Procedure: ESOPHAGOGASTRODUODENOSCOPY (EGD);  Surgeon: Golda Claudis PENNER, MD;  Location: [facility];  Service: Endoscopy;  Laterality: N/A;  2:45-moved to 1:45 per Jenkins   ESOPHAGOGASTRODUODENOSCOPY (EGD) WITH PROPOFOL  N/A 03/07/2021   Procedure: ESOPHAGOGASTRODUODENOSCOPY (EGD) WITH PROPOFOL ;  Surgeon: Golda Claudis PENNER, MD;  Location: [facility];  Service: Endoscopy;  Laterality: N/A;  12:50   LEFT HEART CATHETERIZATION WITH CORONARY ANGIOGRAM N/A 11/18/2013   Procedure: LEFT HEART CATHETERIZATION WITH CORONARY ANGIOGRAM;  Surgeon: Lonni JONETTA Cash, MD;  Location: [facility] CATH LAB;  Service: Cardiovascular;  Laterality: N/A;   OOPHORECTOMY     2012 Dr. May in [place]   PACEMAKER IMPLANT N/A 12/14/2021   Procedure: PACEMAKER IMPLANT;  Surgeon: Waddell Danelle ORN, MD;  Location: [facility] INVASIVE CV LAB;  Service: Cardiovascular;  Laterality: N/A;   PATCH ANGIOPLASTY Right 07/01/2017   Procedure: RIGHT CAROTID PATCH ANGIOPLASTY;  Surgeon: Serene Gaile ORN, MD;  Location: [facility] OR;  Service: Vascular;  Laterality: Right;   SVT ABLATION N/A 12/14/2021   Procedure: SVT ABLATION;  Surgeon: Waddell Danelle ORN, MD;  Location: [facility] INVASIVE CV LAB;  Service: Cardiovascular;  Laterality: N/A;   TEE WITHOUT CARDIOVERSION N/A 03/01/2022   Procedure: TRANSESOPHAGEAL ECHOCARDIOGRAM (TEE);  Surgeon: Francyne Headland, MD;  Location: [facility] ENDOSCOPY;  Service: Cardiovascular;  Laterality: N/A;   TEE WITHOUT CARDIOVERSION N/A 08/14/2022   Procedure: TRANSESOPHAGEAL ECHOCARDIOGRAM;  Surgeon: Alvan Ronal BRAVO, MD;  Location: [facility] INVASIVE CV LAB;  Service: Cardiovascular;  Laterality: N/A;   TONSILLECTOMY     TOTAL KNEE ARTHROPLASTY Right 09/02/2012   Procedure: TOTAL KNEE ARTHROPLASTY- right;  Surgeon: Toribio JULIANNA Chancy, MD;  Location: [facility] OR;  Service: Orthopedics;   Laterality: Right;   TUBAL LIGATION     TYMPANOMASTOIDECTOMY Left 04/02/2016   Procedure: LEFT CANAL WALL DOWN TYMPANOMASTOIDECTOMY;  Surgeon: Daniel Moccasin, MD;  Location: [facility];  Service: ENT;  Laterality: Left;   VAGINAL HYSTERECTOMY  1990   WOUND EXPLORATION Right 07/02/2017   Procedure: RIGHT NECK EXPLORATION WITH INTRAOPERATIVE ULTRASOUND;  Surgeon: Eliza Lonni RAMAN, MD;  Location: [facility] OR;  Service: Vascular;  Laterality: Right;    Current Outpatient Medications  Medication Sig Dispense Refill   Accu-Chek Softclix Lancets lancets check blood sugars twice daily DX E11.65 200 each 3   acetaminophen  (TYLENOL ) 500 MG tablet Take 500 mg by mouth daily as needed for mild pain.     amiodarone  (PACERONE ) 200 MG tablet Take 1 tablet (200 mg total) Monday thru Friday, NONE on Saturday and Sunday 60 tablet 1   Azelastine HCl 0.15 % SOLN      Blood Pressure Monitoring (OMRON 3 SERIES BP MONITOR) DEVI Use as directed 1 each 0   busPIRone  (BUSPAR ) 5 MG tablet Take 1 tablet (5 mg total) by mouth 2 (two) times daily. 180 tablet 3   capsaicin  (ZOSTRIX) 0.025 % cream Apply topically 2 (two) times daily. 60 g 0  citalopram  (CELEXA ) 20 MG tablet Take 1 tablet (20 mg total) by mouth daily. 90 tablet 3   fluticasone  (FLONASE ) 50 MCG/ACT nasal spray Place 2 sprays into both nostrils 2 (two) times daily. 15.8 mL 2   furosemide  (LASIX ) 40 MG tablet take 1 tablet once daily. 90 tablet 3   gabapentin  (NEURONTIN ) 300 MG capsule Take 300 mg by mouth 3 (three) times daily as needed (neuropathy).     glucose blood (ACCU-CHEK GUIDE) test strip USE AS INSTRUCTED TO TEST BLOOD SUGAR DAILY AS DIRECTED. 50 each 11   lactulose  (CHRONULAC ) 10 GM/15ML solution Take 30 mLs (20 g total) by mouth 3 (three) times daily. 2700 mL 3   lamoTRIgine  (LAMICTAL ) 200 MG tablet take 1 tablet twice daily 180 tablet 3   levocetirizine (XYZAL ) 5 MG tablet Take 1 tablet (5 mg total) by mouth every evening. 90 tablet 3    LORazepam  (ATIVAN ) 0.5 MG tablet Take 1 tablet (0.5 mg total) by mouth 2 (two) times daily as needed for anxiety. 60 tablet 5   magic mouthwash (lidocaine , diphenhydrAMINE , alum & mag hydroxide) suspension Swish and swallow 5 mLs 3 (three) times daily as needed for mouth pain. 360 mL 0   meloxicam  (MOBIC ) 15 MG tablet take 1 tablet (15 milligram total) by mouth daily. 30 tablet 2   metoprolol  tartrate (LOPRESSOR ) 25 MG tablet take 0.5 tablets (12.5 milligram total) by mouth 2 (two) times daily. 90 tablet 3   montelukast  (SINGULAIR ) 10 MG tablet Take 1 tablet (10 mg total) by mouth at bedtime. 30 tablet 3   nystatin  (MYCOSTATIN ) 100000 UNIT/ML suspension Take 5 mLs (500,000 Units total) by mouth 4 (four) times daily. Swish and swallow 60 mL 0   Olopatadine  HCl 0.2 % SOLN Apply 1 drop to eye daily as needed (watery, itchy eyes). 3.5 mL 0   ondansetron  (ZOFRAN ) 4 MG tablet take 1 tablet by mouth every 8 hours as needed for nausea and vomiting. 180 tablet 1   pantoprazole  (PROTONIX ) 40 MG tablet Take 1 tablet (40 mg total) by mouth daily. 90 tablet 3   potassium chloride  SA (KLOR-CON  M) 20 MEQ tablet take 2 tablets by mouth daily. 180 tablet 0   sacubitril -valsartan  (ENTRESTO ) 24-26 MG Take 1 tablet by mouth 2 (two) times daily. 180 tablet 3   traMADol  (ULTRAM ) 50 MG tablet Take 50 mg by mouth every 6 (six) hours as needed.     traZODone  (DESYREL ) 50 MG tablet Take 1 tablet (50 mg total) by mouth at bedtime as needed for sleep. 90 tablet 3   warfarin (COUMADIN ) 5 MG tablet Take 1 tablet daily except 1/2 tablet on Tuesdays and Saturdays or as directed 30 tablet 5   No current facility-administered medications for this visit.    Allergies as of 01/13/2024   (No Known Allergies)    Social History   Socioeconomic History   Marital status: Widowed    Spouse name: Not on file   Number of children: 3   Years of education: Not on file   Highest education level: Not on file  Occupational History    Occupation: retired  Tobacco Use   Smoking status: Never   Smokeless tobacco: Never  Vaping Use   Vaping status: Never Used  Substance and Sexual Activity   Alcohol use: No    Alcohol/week: 0.0 standard drinks of alcohol   Drug use: No   Sexual activity: Not Currently  Other Topics Concern   Not on file  Social History  Narrative   Lives alone. Granddaughter lives across the street, her 3 children all live nearby   Social Drivers of Health   Financial Resource Strain: Low Risk  (09/26/2023)   Overall Financial Resource Strain (CARDIA)    Difficulty of Paying Living Expenses: Not hard at all  Food Insecurity: No Food Insecurity (11/11/2023)   Received from [facility]   Hunger Vital Sign    Within the past 12 months, you worried that your food would run out before you got the money to buy more.: Never true    Within the past 12 months, the food you bought just didn't last and you didn't have money to get more.: Never true  Transportation Needs: No Transportation Needs (11/11/2023)   Received from [facility] - Transportation    Lack of Transportation (Medical): No    Lack of Transportation (Non-Medical): No  Physical Activity: Inactive (11/11/2023)   Received from [facility]   Exercise Vital Sign    On average, how many days per week do you engage in moderate to strenuous exercise (like a brisk walk)?: 0 days    On average, how many minutes do you engage in exercise at this level?: 0 min  Stress: Stress Concern Present (11/11/2023)   Received from [facility] - Occupational Stress Questionnaire    Do you feel stress - tense, restless, nervous, or anxious, or unable to sleep at night because your mind is troubled all the time - these days?: Very much  Social Connections: Moderately Isolated (09/26/2023)   Social Connection and Isolation Panel    Frequency of Communication with Friends and Family: More than three  times a week    Frequency of Social Gatherings with Friends and Family: More than three times a week    Attends Religious Services: More than 4 times per year    Active Member of [employer] or Organizations: No    Attends Banker Meetings: Never    Marital Status: Widowed    Review of systems General: negative for malaise, night sweats, fever, chills, weight loss Neck: Negative for lumps, goiter, pain and significant neck swelling Resp: Negative for cough, wheezing, dyspnea at rest CV: Negative for chest pain, leg swelling, palpitations, orthopnea GI: denies melena, hematochezia, nausea, vomiting, diarrhea, constipation, dysphagia, odyonophagia, early satiety or unintentional weight loss.  MSK: Negative for joint pain or swelling, back pain, and muscle pain. Derm: Negative for itching or rash Psych: Denies depression, anxiety, memory loss, confusion. No homicidal or suicidal ideation.  Heme: Negative for prolonged bleeding, bruising easily, and swollen nodes. Endocrine: Negative for cold or heat intolerance, polyuria, polydipsia and goiter. Neuro: negative for tremor, gait imbalance, syncope and seizures. The remainder of the review of systems is noncontributory.  Physical Exam: BP 106/63   Pulse 69   Temp 98 F (36.7 C)   Ht 5' 4 (1.626 m)   Wt 153 lb 14.4 oz (69.8 kg)   BMI 26.42 kg/m  General:   Alert and oriented. No distress noted. Pleasant and cooperative.  Head:  Normocephalic and atraumatic. Eyes:  Conjuctiva clear without scleral icterus. Mouth:  Oral mucosa pink and moist. Good dentition. No lesions. Heart: Normal rate and rhythm, s1 and s2 heart sounds present.  Lungs: Clear lung sounds in all lobes. Respirations equal and unlabored. Abdomen:  +BS, soft, non-tender and non-distended. No rebound or guarding. No HSM or masses noted. Derm: No palmar  erythema or jaundice Msk:  Symmetrical without gross deformities. Normal posture. Extremities:  Without  edema. Neurologic:  Alert and  oriented x4 Psych:  Alert and cooperative. Normal mood and affect.  Invalid input(s): 6 MONTHS   ASSESSMENT: Carol Richmond is a 73 y.o. female presenting today for follow up of MASH cirrhosis and GERD  Cirrhosis: secondary to MASH, relatively well compensated. Last EGD in 2023 with G1 EVs, recommended repeat EGD in 1 year though patient has declined due to her extensive cardiac issues. I discussed this again today and she does not wish to undergo any further endoscopic evaluations. Dr. Eartha had also recommended discussion with cardiology of switching metoprolol  to NSBB given her varices, though previously Cardiology recommended she remain on lopressor  vs. Switching to Coreg  due to her BP. She denies any confusion, ascites or swelling to LEs. Having 3-4 BMs on lactulose  TID which are watery. We will decrease lactulose  to 30ml BID with goal of 2-3 softer BMs per day, I counseled her on this. If she has any confusion, forgetfulness on lower dose, we will need to go back up to TID dosing of lactulose .  She is up to date on HCC screening and MELD labs, MELD 3.0 is 20 though this is likely heavily influenced by elevated INR as she is on coumadin .    GERD: well managed on protonix  40mg  daily. She did have an episode of epigastric pain last week but has not had any further issues. She will make me aware of she has recurrence of pain. No melena, rectal bleeding, weight loss or appetite changes.   PLAN:  -continue lasix  40mg  daily  -up to date on MELD labs/AFP march 2026 -HCC screening via [place]  due March  -continue lactulose  30ml, decrease to BID, goal of 2-3 soft BMs per day, will need to go back to TID dosing if any signs of HE - Reduce salt intake to <2 g per day - Can take Tylenol  max of 2 g per day (650 mg q8h) for pain - Avoid NSAIDs for pain (discussed not to take ibuprofen given cirrhosis and coumadin ) - Avoid eating raw oysters/shellfish - Ensure every  night before going to sleep  All questions were answered, patient verbalized understanding and is in agreement with plan as outlined above.    Follow Up: 6 months   Gionna Polak L. Mariette, MSN, APRN, AGNP-C Adult-Gerontology Nurse Practitioner [facility]  I have reviewed the note and agree with the APP's assessment as described in this progress note  Toribio Eartha, MD Gastroenterology and Hepatology [facility] Gastroenterology

## 2024-01-19 ENCOUNTER — Ambulatory Visit

## 2024-01-19 ENCOUNTER — Encounter (HOSPITAL_COMMUNITY)

## 2024-01-20 ENCOUNTER — Ambulatory Visit: Attending: Cardiology | Admitting: *Deleted

## 2024-01-20 DIAGNOSIS — I4891 Unspecified atrial fibrillation: Secondary | ICD-10-CM | POA: Diagnosis not present

## 2024-01-20 DIAGNOSIS — Z5181 Encounter for therapeutic drug level monitoring: Secondary | ICD-10-CM

## 2024-01-20 LAB — POCT INR: INR: 3.3 — AB (ref 2.0–3.0)

## 2024-01-20 NOTE — Patient Instructions (Signed)
 Hold warfarin tonight then resume 1 tablet daily except 1/2 tablet on Tuesdays and Saturdays Recheck in 6 wks

## 2024-01-20 NOTE — Progress Notes (Signed)
 INR 3.3; Please see anticoagulation encounter

## 2024-01-22 ENCOUNTER — Other Ambulatory Visit: Payer: Self-pay | Admitting: Family Medicine

## 2024-01-22 ENCOUNTER — Other Ambulatory Visit: Payer: Self-pay | Admitting: Cardiology

## 2024-01-22 DIAGNOSIS — R569 Unspecified convulsions: Secondary | ICD-10-CM

## 2024-01-22 DIAGNOSIS — M26609 Unspecified temporomandibular joint disorder, unspecified side: Secondary | ICD-10-CM

## 2024-01-22 NOTE — Telephone Encounter (Signed)
  The original prescription was discontinued on 12/17/2023 by Joesph Annabella HERO, FNP for the following reason: Side effect (s). Renewing this prescription may not be appropriate.

## 2024-01-28 DIAGNOSIS — M4722 Other spondylosis with radiculopathy, cervical region: Secondary | ICD-10-CM | POA: Diagnosis not present

## 2024-01-28 DIAGNOSIS — M792 Neuralgia and neuritis, unspecified: Secondary | ICD-10-CM | POA: Diagnosis not present

## 2024-01-28 DIAGNOSIS — M5030 Other cervical disc degeneration, unspecified cervical region: Secondary | ICD-10-CM | POA: Diagnosis not present

## 2024-01-30 ENCOUNTER — Ambulatory Visit: Attending: Internal Medicine | Admitting: Internal Medicine

## 2024-01-30 ENCOUNTER — Encounter: Payer: Self-pay | Admitting: Internal Medicine

## 2024-01-30 VITALS — BP 128/64 | HR 68 | Ht 64.0 in | Wt 158.8 lb

## 2024-01-30 DIAGNOSIS — I480 Paroxysmal atrial fibrillation: Secondary | ICD-10-CM | POA: Diagnosis not present

## 2024-01-30 LAB — CUP PACEART INCLINIC DEVICE CHECK
Date Time Interrogation Session: 20251212113014
Implantable Lead Connection Status: 753985
Implantable Lead Connection Status: 753985
Implantable Lead Implant Date: 20231027
Implantable Lead Implant Date: 20231027
Implantable Lead Location: 753859
Implantable Lead Location: 753860
Implantable Lead Model: 3830
Implantable Lead Model: 5076
Implantable Pulse Generator Implant Date: 20231027

## 2024-01-30 NOTE — Patient Instructions (Signed)
 Medication Instructions:  Your physician recommends that you continue on your current medications as directed. Please refer to the Current Medication list given to you today.   Labwork: None today  Testing/Procedures: None today  Follow-Up: 1 year Dr.Carlisle  Any Other Special Instructions Will Be Listed Below (If Applicable).  If you need a refill on your cardiac medications before your next appointment, please call your pharmacy.

## 2024-01-30 NOTE — Progress Notes (Signed)
 HPI Carol Richmond returns for ongoing evaluation of SVT s/p ablation complicated by CHB. She is a pleasant 73 yo woman with a h/o fatty liver disease, HTN, and obesity who has developed a wide QRS tachycardia. She has baseline LBBB and her echo demonstrates that she has had a reduction in her EF from 55% to 35-40%. She has class 2 symptoms. She has not had syncope and she has only mild dyspnea.  During her ablation, she had transient CHB and underwent PPM insertion. In the interim she notes she feels well. She denies palpiations.  Allergies[1]   Current Outpatient Medications  Medication Sig Dispense Refill   Accu-Chek Softclix Lancets lancets check blood sugars twice daily DX E11.65 200 each 3   acetaminophen  (TYLENOL ) 500 MG tablet Take 500 mg by mouth daily as needed for mild pain.     amiodarone  (PACERONE ) 200 MG tablet Take 1 tablet (200 mg total) Monday thru Friday, NONE on Saturday and Sunday 60 tablet 1   Azelastine HCl 0.15 % SOLN      Blood Pressure Monitoring (OMRON 3 SERIES BP MONITOR) DEVI Use as directed 1 each 0   busPIRone  (BUSPAR ) 5 MG tablet Take 1 tablet (5 mg total) by mouth 2 (two) times daily. 180 tablet 3   capsaicin  (ZOSTRIX) 0.025 % cream Apply topically 2 (two) times daily. 60 g 0   citalopram  (CELEXA ) 20 MG tablet Take 1 tablet (20 mg total) by mouth daily. 90 tablet 3   fluticasone  (FLONASE ) 50 MCG/ACT nasal spray Place 2 sprays into both nostrils 2 (two) times daily. 15.8 mL 2   furosemide  (LASIX ) 40 MG tablet take 1 tablet once daily. 90 tablet 3   gabapentin  (NEURONTIN ) 300 MG capsule Take 300 mg by mouth 3 (three) times daily as needed (neuropathy).     glucose blood (ACCU-CHEK GUIDE) test strip USE AS INSTRUCTED TO TEST BLOOD SUGAR DAILY AS DIRECTED. 50 each 11   lactulose  (CHRONULAC ) 10 GM/15ML solution Take 30 mLs (20 g total) by mouth 3 (three) times daily. 2700 mL 3   lamoTRIgine  (LAMICTAL ) 200 MG tablet take 1 tablet twice daily 180 tablet 3    levocetirizine (XYZAL ) 5 MG tablet Take 1 tablet (5 mg total) by mouth every evening. 90 tablet 3   LORazepam  (ATIVAN ) 0.5 MG tablet Take 1 tablet (0.5 mg total) by mouth 2 (two) times daily as needed for anxiety. 60 tablet 5   magic mouthwash (lidocaine , diphenhydrAMINE , alum & mag hydroxide) suspension Swish and swallow 5 mLs 3 (three) times daily as needed for mouth pain. 360 mL 0   meloxicam  (MOBIC ) 15 MG tablet take 1 tablet (15 milligram total) by mouth daily. 30 tablet 2   metoprolol  tartrate (LOPRESSOR ) 25 MG tablet take 0.5 tablets (12.5 milligram total) by mouth 2 (two) times daily. 90 tablet 3   montelukast  (SINGULAIR ) 10 MG tablet Take 1 tablet (10 mg total) by mouth at bedtime. 30 tablet 3   nystatin  (MYCOSTATIN ) 100000 UNIT/ML suspension Take 5 mLs (500,000 Units total) by mouth 4 (four) times daily. Swish and swallow 60 mL 0   Olopatadine  HCl 0.2 % SOLN Apply 1 drop to eye daily as needed (watery, itchy eyes). 3.5 mL 0   ondansetron  (ZOFRAN ) 4 MG tablet take 1 tablet by mouth every 8 hours as needed for nausea and vomiting. 180 tablet 1   pantoprazole  (PROTONIX ) 40 MG tablet Take 1 tablet (40 mg total) by mouth daily. 90 tablet 3  potassium chloride  SA (KLOR-CON  M) 20 MEQ tablet take 2 tablets by mouth daily. 180 tablet 0   sacubitril -valsartan  (ENTRESTO ) 24-26 MG Take 1 tablet by mouth 2 (two) times daily. 180 tablet 3   traMADol  (ULTRAM ) 50 MG tablet Take 50 mg by mouth every 6 (six) hours as needed.     traZODone  (DESYREL ) 50 MG tablet Take 1 tablet (50 mg total) by mouth at bedtime as needed for sleep. 90 tablet 3   warfarin (COUMADIN ) 5 MG tablet take 1 tablet daily except ONE-HALF (1/2) tablet on tuesdays and saturdays or as directed 30 tablet 0   No current facility-administered medications for this visit.     Past Medical History:  Diagnosis Date   Asthma    AVNRT (AV nodal re-entry tachycardia)    Carotid artery disease    Cirrhosis (HCC)    Diverticulosis of colon  (without mention of hemorrhage)    Essential hypertension    Functional ovarian cysts    GERD (gastroesophageal reflux disease)    Hemorrhoids, external, without mention of complication    Hormone replacement therapy (postmenopausal)    IBS (irritable bowel syndrome)    Migraine    Mitral valve disease    Pacemaker    Complete heart block - Medtronic   Right knee DJD    Seizures (HCC)    Sleep apnea    TIA (transient ischemic attack)    Type 2 diabetes mellitus (HCC)     ROS:   All systems reviewed and negative except as noted in the HPI.   Past Surgical History:  Procedure Laterality Date   BIOPSY  09/23/2018   Procedure: BIOPSY;  Surgeon: Golda Claudis PENNER, MD;  [facility]: [facility];  Service: Endoscopy;;  duodeneum and stomach   CARDIOVERSION N/A 08/14/2022   Procedure: CARDIOVERSION;  Surgeon: Alvan Ronal BRAVO, MD;  Location: [facility] INVASIVE CV LAB;  Service: Cardiovascular;  Laterality: N/A;   CHOLECYSTECTOMY  1980   ENDARTERECTOMY Right 07/01/2017   Procedure: ENDARTERECTOMY CAROTID RIGHT;  Surgeon: Serene Gaile ORN, MD;  Location: [facility] OR;  Service: Vascular;  Laterality: Right;   ESOPHAGOGASTRODUODENOSCOPY N/A 09/23/2018   Procedure: ESOPHAGOGASTRODUODENOSCOPY (EGD);  Surgeon: Golda Claudis PENNER, MD;  Location: [facility];  Service: Endoscopy;  Laterality: N/A;  2:45-moved to 1:45 per Jenkins   ESOPHAGOGASTRODUODENOSCOPY (EGD) WITH PROPOFOL  N/A 03/07/2021   Procedure: ESOPHAGOGASTRODUODENOSCOPY (EGD) WITH PROPOFOL ;  Surgeon: Golda Claudis PENNER, MD;  Location: [facility];  Service: Endoscopy;  Laterality: N/A;  12:50   LEFT HEART CATHETERIZATION WITH CORONARY ANGIOGRAM N/A 11/18/2013   Procedure: LEFT HEART CATHETERIZATION WITH CORONARY ANGIOGRAM;  Surgeon: Lonni JONETTA Cash, MD;  Location: [facility] CATH LAB;  Service: Cardiovascular;  Laterality: N/A;   OOPHORECTOMY     2012 Dr. May in [place]   PACEMAKER IMPLANT N/A 12/14/2021   Procedure: PACEMAKER IMPLANT;  Surgeon: Waddell Danelle ORN, MD;  Location: [facility] INVASIVE CV LAB;  Service: Cardiovascular;  Laterality: N/A;   PATCH ANGIOPLASTY Right 07/01/2017   Procedure: RIGHT CAROTID PATCH ANGIOPLASTY;  Surgeon: Serene Gaile ORN, MD;  Location: [facility] OR;  Service: Vascular;  Laterality: Right;   SVT ABLATION N/A 12/14/2021   Procedure: SVT ABLATION;  Surgeon: Waddell Danelle ORN, MD;  Location: [facility] INVAS[facility] CV LAB;  Service: Cardiovascular;  Laterality: N/A;   TEE WITHOUT CARDIOVERSION N/A 03/01/2022   Procedure: TRANSESOPHAGEAL ECHOCARDIOGRAM (TEE);  Surgeon: Francyne Headland, MD;  Location: [facility] ENDOSCOPY;  Service: Cardiovascular;  Laterality: N/A;   TEE WITHOUT CARDIOVERSION N/A 08/14/2022  Procedure: TRANSESOPHAGEAL ECHOCARDIOGRAM;  Surgeon: Alvan Ronal BRAVO, MD;  Location: [facility] INVASIVE CV LAB;  Service: Cardiovascular;  Laterality: N/A;   TONSILLECTOMY     TOTAL KNEE ARTHROPLASTY Right 09/02/2012   Procedure: TOTAL KNEE ARTHROPLASTY- right;  Surgeon: Toribio JULIANNA Chancy, MD;  Location: [facility] OR;  Service: Orthopedics;  Laterality: Right;   TUBAL LIGATION     TYMPANOMASTOIDECTOMY Left 04/02/2016   Procedure: LEFT CANAL WALL DOWN TYMPANOMASTOIDECTOMY;  Surgeon: Daniel Moccasin, MD;  Location: [facility];  Service: ENT;  Laterality: Left;   VAGINAL HYSTERECTOMY  1990   WOUND EXPLORATION Right 07/02/2017   Procedure: RIGHT NECK EXPLORATION WITH INTRAOPERATIVE ULTRASOUND;  Surgeon: Eliza Lonni RAMAN, MD;  Location: [facility] OR;  Service: Vascular;  Laterality: Right;     Family History  Problem Relation Age of Onset   Heart disease Mother    Diabetes Mother    Heart disease Brother    Cancer Sister    Heart disease Brother    Heart disease Brother    Heart disease Son      Social History   Socioeconomic History   Marital status: Widowed    Spouse name: Not on file   Number of children: 3   Years of education: Not on file   Highest education level: Not on file  Occupational History   Occupation: retired  Tobacco Use   Smoking  status: Never   Smokeless tobacco: Never  Vaping Use   Vaping status: Never Used  Substance and Sexual Activity   Alcohol use: No    Alcohol/week: 0.0 standard drinks of alcohol   Drug use: No   Sexual activity: Not Currently  Other Topics Concern   Not on file  Social History Narrative   Lives alone. Granddaughter lives across the street, her 3 children all live nearby   Social Drivers of Health   Tobacco Use: Low Risk (01/30/2024)   Patient History    Smoking Tobacco Use: Never    Smokeless Tobacco Use: Never    Passive Exposure: Not on file  Financial Resource Strain: Low Risk (09/26/2023)   Overall Financial Resource Strain (CARDIA)    Difficulty of Paying Living Expenses: Not hard at all  Food Insecurity: No Food Insecurity (11/11/2023)   Received from [facility]   Epic    Within the past 12 months, you worried that your food would run out before you got the money to buy more.: Never true    Within the past 12 months, the food you bought just didn't last and you didn't have money to get more.: Never true  Transportation Needs: No Transportation Needs (11/11/2023)   Received from [facility]   PRAPARE - Transportation    Lack of Transportation (Medical): No    Lack of Transportation (Non-Medical): No  Physical Activity: Inactive (11/11/2023)   Received from [facility]   Exercise Vital Sign    On average, how many days per week do you engage in moderate to strenuous exercise (like a brisk walk)?: 0 days    On average, how many minutes do you engage in exercise at this level?: 0 min  Stress: Stress Concern Present (11/11/2023)   Received from [facility] of Occupational Health - Occupational Stress Questionnaire    Do you feel stress - tense, restless, nervous, or anxious, or unable to sleep at night because your mind is troubled all the time - these days?: Very much  Social Connections:  Moderately Isolated (09/26/2023)   Social Connection  and Isolation Panel    Frequency of Communication with Friends and Family: More than three times a week    Frequency of Social Gatherings with Friends and Family: More than three times a week    Attends Religious Services: More than 4 times per year    Active Member of [employer] or Organizations: No    Attends Banker Meetings: Never    Marital Status: Widowed  Intimate Partner Violence: Not At Risk (09/26/2023)   Epic    Fear of Current or Ex-Partner: No    Emotionally Abused: No    Physically Abused: No    Sexually Abused: No  Depression (PHQ2-9): Low Risk (12/17/2023)   Depression (PHQ2-9)    PHQ-2 Score: 0  Alcohol Screen: Low Risk (09/26/2023)   Alcohol Screen    Last Alcohol Screening Score (AUDIT): 0  Housing: Unknown (09/26/2023)   Epic    Unable to Pay for Housing in the Last Year: No    Number of Times Moved in the Last Year: Not on file    Homeless in the Last Year: No  Utilities: Low Risk (11/11/2023)   Received from [facility]   Utilities    Within the past 12 months, have you been unable to get utilities(heat, electricity) when it was really needed?: No  Health Literacy: Low Risk (11/11/2023)   Received from [facility] Literacy    How often do you need to have someone help you when you read instructions, pamphlets, or other written material from your doctor or pharmacy?: Never     BP 128/64 (BP Location: Right Arm)   Pulse 68   Ht 5' 4 (1.626 m)   Wt 158 lb 12.8 oz (72 kg)   SpO2 95%   BMI 27.26 kg/m   Physical Exam:  Well appearing 73 yo woman, NAD HEENT: Unremarkable Neck:  No JVD, no thyromegally Lymphatics:  No adenopathy Back:  No CVA tenderness Lungs:  Clear with no wheezes HEART:  Regular rate rhythm, no murmurs, no rubs, no clicks Abd:  soft, positive bowel sounds, no organomegally, no rebound, no guarding Ext:  2 plus pulses, no edema, no cyanosis, no clubbing Skin:  No rashes no nodules Neuro:  CN II through XII  intact, motor grossly intact  DEVICE  Normal device function.  See PaceArt for details.   Assess/Plan:    1. SVT - she is s/p EP study and catheter ablation and has had no SVT CHB - she is conducting with LBBB and has not had any significant ventricular pacing PPM - her medtronic DDD PM is working normally.  PAF - she is maintaining NSR. I asked her to reduce her amio to 200 mg daily, none on Sat/Sun.  PVC's/NSVT - she will continue low dose amiodarone . She has not had more arrhythmias.   Danelle Waddell ROLLA Danelle Dyshawn Cangelosi,MD    [1] No Known Allergies

## 2024-02-02 ENCOUNTER — Ambulatory Visit: Payer: Self-pay | Admitting: Family Medicine

## 2024-02-02 VITALS — BP 116/73 | HR 70 | Temp 97.7°F | Ht 64.0 in | Wt 156.6 lb

## 2024-02-02 DIAGNOSIS — E1165 Type 2 diabetes mellitus with hyperglycemia: Secondary | ICD-10-CM

## 2024-02-02 DIAGNOSIS — F1320 Sedative, hypnotic or anxiolytic dependence, uncomplicated: Secondary | ICD-10-CM | POA: Diagnosis not present

## 2024-02-02 DIAGNOSIS — N1831 Chronic kidney disease, stage 3a: Secondary | ICD-10-CM | POA: Insufficient documentation

## 2024-02-02 DIAGNOSIS — F411 Generalized anxiety disorder: Secondary | ICD-10-CM | POA: Diagnosis not present

## 2024-02-02 DIAGNOSIS — Z95811 Presence of heart assist device: Secondary | ICD-10-CM

## 2024-02-02 DIAGNOSIS — Z79899 Other long term (current) drug therapy: Secondary | ICD-10-CM

## 2024-02-02 DIAGNOSIS — E1169 Type 2 diabetes mellitus with other specified complication: Secondary | ICD-10-CM | POA: Diagnosis not present

## 2024-02-02 DIAGNOSIS — L602 Onychogryphosis: Secondary | ICD-10-CM | POA: Diagnosis not present

## 2024-02-02 DIAGNOSIS — R569 Unspecified convulsions: Secondary | ICD-10-CM

## 2024-02-02 DIAGNOSIS — I152 Hypertension secondary to endocrine disorders: Secondary | ICD-10-CM

## 2024-02-02 DIAGNOSIS — I5022 Chronic systolic (congestive) heart failure: Secondary | ICD-10-CM

## 2024-02-02 DIAGNOSIS — K7460 Unspecified cirrhosis of liver: Secondary | ICD-10-CM | POA: Diagnosis not present

## 2024-02-02 DIAGNOSIS — E1159 Type 2 diabetes mellitus with other circulatory complications: Secondary | ICD-10-CM | POA: Diagnosis not present

## 2024-02-02 LAB — BAYER DCA HB A1C WAIVED: HB A1C (BAYER DCA - WAIVED): 5.6 % (ref 4.8–5.6)

## 2024-02-02 MED ORDER — LORAZEPAM 0.5 MG PO TABS: 0.5000 mg | ORAL_TABLET | Freq: Two times a day (BID) | ORAL | 5 refills | Status: AC | PRN

## 2024-02-02 NOTE — Progress Notes (Unsigned)
 Established Patient Office Visit  Subjective   [name], female    DOB: [birth year]  Age: 73 y.o. MRN: [id number]  Chief Complaint  Patient presents with   Medical Management of Chronic Issues    HPI  DM Pt presents for follow up evaluation of Type 2 diabetes mellitus. Patient denies foot ulcerations, increased appetite, nausea, paresthesia of the feet, polydipsia, polyuria, visual disturbances, vomiting, and weight loss.  Current diabetic medications include: none currently, diet controlled Compliant with meds - Yes  Current monitoring regimen: not checking  She would like referral to podiatry. Has difficulty cutting toenails due to thickness.   2. HTN/ A fib/ CHF, s/p pacemaker Complaint with meds - Yes Current Medications -  amiodarone , lasix , metoprolol , entreso, coumadin . Amiodarone  was recently decreased by cardiology. Pertinent ROS:  Headache - No Fatigue - No Visual Disturbances - No Chest pain - No Dyspnea - No Palpitations - No LE edema - No  3.  HLD She has declined medications. Regular diet, no exercise. She has lost 2 pounds since her last visit.   4. Cirrhosis Denies abdominal pain, nausea, vomiting, itching, hematemesis. She does have esophageal varies. She recently had follow up with GI.   5. Anxiety/depression Compliant with celexa .Taking ativan  BID prn- takes daily. She is also compliant with buspar . Reports well controlled right now.   6. Allergies Increased symptoms lately. She would like to know if there is an antihistamine that her insurance will cover.  Right now she has been taking allergra OTC. Also reports watery, itchy eyes.      02/02/2024   10:58 AM 12/17/2023    3:08 PM 11/25/2023    9:16 AM  Depression screen PHQ 2/9  Decreased Interest 0 0 0  Down, Depressed, Hopeless 0 0 0  PHQ - 2 Score 0 0 0  Altered sleeping 0 0   Tired, decreased energy 0 0   Change in appetite 0 0   Feeling bad or failure about  yourself  0 0   Trouble concentrating 0 0   Moving slowly or fidgety/restless 0 0   Suicidal thoughts 0 0   PHQ-9 Score 0 0    Difficult doing work/chores Not difficult at all Not difficult at all      Data saved with a previous flowsheet row definition      02/02/2024   10:58 AM 12/17/2023    3:09 PM 11/03/2023   11:23 AM 07/30/2023    5:20 PM  GAD 7 : Generalized Anxiety Score  Nervous, Anxious, on Edge 1 3 3 3   Control/stop worrying 0 0 0 0  Worry too much - different things 0 0 0 0  Trouble relaxing 0 0 0 0  Restless 0 0 0 0  Easily annoyed or irritable 0 0 0 0  Afraid - awful might happen 0 0 0 0  Total GAD 7 Score 1 3 3 3   Anxiety Difficulty Not difficult at all Not difficult at all Not difficult at all Not difficult at all      Past Medical History:  Diagnosis Date   Asthma    AVNRT (AV nodal re-entry tachycardia)    Carotid artery disease    Cirrhosis (HCC)    Diverticulosis of colon (without mention of hemorrhage)    Essential hypertension    Functional ovarian cysts    GERD (gastroesophageal reflux disease)    Hemorrhoids, external, without mention of complication    Hormone replacement therapy (postmenopausal)  IBS (irritable bowel syndrome)    Migraine    Mitral valve disease    Pacemaker    Complete heart block - Medtronic   Right knee DJD    Seizures (HCC)    Sleep apnea    TIA (transient ischemic attack)    Type 2 diabetes mellitus (HCC)       ROS As per HPI.    Objective:     BP 116/73   Pulse 70   Temp 97.7 F (36.5 C) (Temporal)   Ht 5' 4 (1.626 m)   Wt 156 lb 9.6 oz (71 kg)   SpO2 98%   BMI 26.88 kg/m  Wt Readings from Last 3 Encounters:  02/02/24 156 lb 9.6 oz (71 kg)  01/30/24 158 lb 12.8 oz (72 kg)  01/13/24 153 lb 14.4 oz (69.8 kg)     Physical Exam Vitals and nursing note reviewed.  Constitutional:      General: She is not in acute distress.    Appearance: Normal appearance. She is not ill-appearing,  toxic-appearing or diaphoretic.  HENT:     Left Ear: Tympanic membrane normal.  Eyes:     General: No scleral icterus. Neck:     Thyroid : No thyroid  mass, thyromegaly or thyroid  tenderness.  Cardiovascular:     Rate and Rhythm: Normal rate and regular rhythm.     Heart sounds: Murmur heard.     Systolic murmur is present with a grade of 2/6.  Pulmonary:     Effort: Pulmonary effort is normal. No respiratory distress.     Breath sounds: Normal breath sounds.  Abdominal:     General: Bowel sounds are normal. There is no distension.     Palpations: Abdomen is soft.     Tenderness: There is no abdominal tenderness. There is no guarding or rebound.  Musculoskeletal:     Right lower leg: No edema.     Left lower leg: No edema.  Skin:    General: Skin is warm and dry.     Coloration: Skin is not jaundiced.  Neurological:     Mental Status: She is alert and oriented to person, place, and time. Mental status is at baseline.     Gait: Gait normal.  Psychiatric:        Attention and Perception: Attention normal.        Mood and Affect: Mood normal.        Speech: Speech normal.        Behavior: Behavior normal.        Cognition and Memory: Cognition is impaired. Memory is impaired.      Results for orders placed or performed in visit on 02/02/24  Bayer DCA Hb A1c Waived  Result Value Ref Range   HB A1C (BAYER DCA - WAIVED) 5.6 4.8 - 5.6 %  Lipid panel  Result Value Ref Range   Cholesterol, Total 204 (H) 100 - 199 mg/dL   Triglycerides 782 (H) 0 - 149 mg/dL   HDL 53 >60 mg/dL   VLDL Cholesterol Cal 38 5 - 40 mg/dL   LDL Chol Calc (NIH) 886 (H) 0 - 99 mg/dL   Chol/HDL Ratio 3.8 0.0 - 4.4 ratio  CMP14+EGFR  Result Value Ref Range   Glucose 113 (H) 70 - 99 mg/dL   BUN 18 8 - 27 mg/dL   Creatinine, Ser 8.45 (H) 0.57 - 1.00 mg/dL   eGFR 36 (L) >40 fO/fpw/8.26   BUN/Creatinine Ratio 12 12 - 28  Sodium 140 134 - 144 mmol/L   Potassium 4.9 3.5 - 5.2 mmol/L   Chloride 105 96 -  106 mmol/L   CO2 19 (L) 20 - 29 mmol/L   Calcium  9.4 8.7 - 10.3 mg/dL   Total Protein 7.4 6.0 - 8.5 g/dL   Albumin  4.5 3.8 - 4.8 g/dL   Globulin, Total 2.9 1.5 - 4.5 g/dL   Bilirubin Total 0.3 0.0 - 1.2 mg/dL   Alkaline Phosphatase 110 49 - 135 IU/L   AST 31 0 - 40 IU/L   ALT 15 0 - 32 IU/L  Lamotrigine  level  Result Value Ref Range   Lamotrigine  Lvl 8.4 2.0 - 20.0 ug/mL      The 10-year ASCVD risk score (Arnett DK, et al., 2019) is: 21.5%    Assessment & Plan:   Carol Richmond was seen today for medical management of chronic issues.  Diagnoses and all orders for this visit:  Type 2 diabetes mellitus with hyperglycemia, without long-term current use of insulin  (HCC) A1c at goal. Referral to podiatry placed.  -     Bayer DCA Hb A1c Waived -     CMP14+EGFR -     Ambulatory referral to Podiatry  Thickened nail -     Ambulatory referral to Podiatry  Hypertension associated with type 2 diabetes mellitus (HCC) BP at goal.   Hyperlipidemia associated with type 2 diabetes mellitus (HCC) Refused statin.  -     Lipid panel  Chronic kidney disease, stage 3a (HCC) CMP pending.   Cirrhosis, nonalcoholic (HCC) Denies symptoms. Managed by GI.   Presence of heart assist device [facility]) Continue follow up with cardiology. RRR on exam today.   Chronic HFrEF (heart failure with reduced ejection fraction) (HCC) Euvolemic on exam today.   Partial seizure (HCC) Denies seizure like activity. Will check level today.  -     Lamotrigine  level  Generalized anxiety disorder Benzodiazepine dependence (HCC) Controlled substance agreement signed Well controlled on current regimen. PDMP reviewed, no red flags. CSA and UDS are UTD.  -     LORazepam  (ATIVAN ) 0.5 MG tablet; Take 1 tablet (0.5 mg total) by mouth 2 (two) times daily as needed for anxiety.   Return in about 3 months (around 05/02/2024) for chronic follow up.  The patient indicates understanding of these issues and agrees with the  plan.  Carol CHRISTELLA Search, FNP

## 2024-02-04 LAB — CMP14+EGFR
ALT: 15 IU/L (ref 0–32)
AST: 31 IU/L (ref 0–40)
Albumin: 4.5 g/dL (ref 3.8–4.8)
Alkaline Phosphatase: 110 IU/L (ref 49–135)
BUN/Creatinine Ratio: 12 (ref 12–28)
BUN: 18 mg/dL (ref 8–27)
Bilirubin Total: 0.3 mg/dL (ref 0.0–1.2)
CO2: 19 mmol/L — ABNORMAL LOW (ref 20–29)
Calcium: 9.4 mg/dL (ref 8.7–10.3)
Chloride: 105 mmol/L (ref 96–106)
Creatinine, Ser: 1.54 mg/dL — ABNORMAL HIGH (ref 0.57–1.00)
Globulin, Total: 2.9 g/dL (ref 1.5–4.5)
Glucose: 113 mg/dL — ABNORMAL HIGH (ref 70–99)
Potassium: 4.9 mmol/L (ref 3.5–5.2)
Sodium: 140 mmol/L (ref 134–144)
Total Protein: 7.4 g/dL (ref 6.0–8.5)
eGFR: 36 mL/min/1.73 — ABNORMAL LOW (ref >59)

## 2024-02-04 LAB — LAMOTRIGINE LEVEL: Lamotrigine Lvl: 8.4 ug/mL (ref 2.0–20.0)

## 2024-02-04 LAB — LIPID PANEL
Chol/HDL Ratio: 3.8 ratio (ref 0.0–4.4)
Cholesterol, Total: 204 mg/dL — ABNORMAL HIGH (ref 100–199)
HDL: 53 mg/dL (ref >39)
LDL Chol Calc (NIH): 113 mg/dL — ABNORMAL HIGH (ref 0–99)
Triglycerides: 217 mg/dL — ABNORMAL HIGH (ref 0–149)
VLDL Cholesterol Cal: 38 mg/dL (ref 5–40)

## 2024-02-05 ENCOUNTER — Encounter: Payer: Self-pay | Admitting: Family Medicine

## 2024-02-06 NOTE — Telephone Encounter (Unsigned)
 Copied from CRM #[id number]. Topic: Clinical - Lab/Test Results >> Feb 06, 2024  9:48 AM Graeme ORN wrote: Reason for CRM: Patient returned missed call for labs. Read note as written by provider. Patient understood but had questions about kidneys. Would like a call back from [place]. Thank You.

## 2024-02-10 ENCOUNTER — Other Ambulatory Visit: Payer: Self-pay | Admitting: Family Medicine

## 2024-02-11 NOTE — Telephone Encounter (Signed)
 Copied from CRM #[id number]. Topic: Clinical - Lab/Test Results >> Feb 11, 2024  8:31 AM [name] wrote: Pls call patient back.. she still had questions about labs and kidneys.Carol Richmond

## 2024-02-17 ENCOUNTER — Other Ambulatory Visit: Payer: Self-pay | Admitting: Family Medicine

## 2024-02-17 DIAGNOSIS — M26609 Unspecified temporomandibular joint disorder, unspecified side: Secondary | ICD-10-CM

## 2024-02-18 ENCOUNTER — Other Ambulatory Visit (INDEPENDENT_AMBULATORY_CARE_PROVIDER_SITE_OTHER): Payer: Self-pay | Admitting: Gastroenterology

## 2024-02-18 DIAGNOSIS — K7682 Hepatic encephalopathy: Secondary | ICD-10-CM

## 2024-02-20 ENCOUNTER — Other Ambulatory Visit (INDEPENDENT_AMBULATORY_CARE_PROVIDER_SITE_OTHER): Payer: Self-pay | Admitting: Gastroenterology

## 2024-02-20 DIAGNOSIS — K7682 Hepatic encephalopathy: Secondary | ICD-10-CM

## 2024-02-23 ENCOUNTER — Ambulatory Visit: Admitting: Nurse Practitioner

## 2024-02-24 ENCOUNTER — Ambulatory Visit: Admitting: Family Medicine

## 2024-02-24 ENCOUNTER — Telehealth: Payer: Self-pay | Admitting: Family Medicine

## 2024-02-24 ENCOUNTER — Telehealth (INDEPENDENT_AMBULATORY_CARE_PROVIDER_SITE_OTHER): Payer: Self-pay

## 2024-02-24 NOTE — Telephone Encounter (Signed)
 Patient is calling to get refills on her lactulose  to be sent to [facility].   Patient last seen by [facility] on 01/13/2024, per that note,-continue lactulose  30ml, decrease to BID, goal of 2-3 soft BMs per day, will need to go back to TID dosing if any signs of HE   Went to reorder this and it says it is already pended for your review. Please submit. Thanks,

## 2024-02-24 NOTE — Telephone Encounter (Signed)
 Looks like patient was supposed to see provider Alonso Severin) today but patient called to reschedule. She is now scheduled to see PCP on 02/26/2024. Recurrent issue that has been addressed several times. Call noted. Will close encounter.

## 2024-02-24 NOTE — Telephone Encounter (Unsigned)
 Copied from CRM [id number]. Topic: Clinical - Refused Triage >> Feb 24, 2024 10:50 AM Wess RAMAN wrote: Patient/caller voiced complaints of Has TMJ & when she chews, it makes noise & gum is sore when she bites. Pain level 1-2. Does not want to speak with the nurse

## 2024-02-26 ENCOUNTER — Ambulatory Visit: Admitting: Family Medicine

## 2024-02-26 VITALS — BP 114/57 | HR 63 | Temp 97.8°F | Ht 64.0 in | Wt 154.6 lb

## 2024-02-26 DIAGNOSIS — M26609 Unspecified temporomandibular joint disorder, unspecified side: Secondary | ICD-10-CM

## 2024-02-26 DIAGNOSIS — G629 Polyneuropathy, unspecified: Secondary | ICD-10-CM

## 2024-02-26 NOTE — Progress Notes (Unsigned)
" ° °  Acute Office Visit  Subjective:     [name], female    DOB: 01-Apr-1950, 74 y.o.   MRN: [id number]  Chief Complaint  Patient presents with   Temporomandibular Joint Pain    HPI Patient is in today for ***  ROS      Objective:    BP (!) 114/57   Pulse 63   Temp 97.8 F (36.6 C) (Temporal)   Ht 5' 4 (1.626 m)   Wt 154 lb 9.6 oz (70.1 kg)   SpO2 99%   BMI 26.54 kg/m  {Vitals History (Optional):23777}  Physical Exam  No results found for any visits on 02/26/24.      Assessment & Plan:   Problem List Items Addressed This Visit   None   No orders of the defined types were placed in this encounter.   No follow-ups on file.  Annabella CHRISTELLA Search, FNP   "

## 2024-03-01 ENCOUNTER — Other Ambulatory Visit: Payer: Self-pay | Admitting: Nurse Practitioner

## 2024-03-01 ENCOUNTER — Other Ambulatory Visit: Payer: Self-pay | Admitting: Cardiology

## 2024-03-01 ENCOUNTER — Encounter: Payer: Self-pay | Admitting: Family Medicine

## 2024-03-02 ENCOUNTER — Ambulatory Visit: Attending: Cardiology | Admitting: *Deleted

## 2024-03-02 DIAGNOSIS — Z5181 Encounter for therapeutic drug level monitoring: Secondary | ICD-10-CM | POA: Diagnosis not present

## 2024-03-02 DIAGNOSIS — I4891 Unspecified atrial fibrillation: Secondary | ICD-10-CM | POA: Diagnosis not present

## 2024-03-02 LAB — POCT INR: INR: 3.2 — AB (ref 2.0–3.0)

## 2024-03-02 NOTE — Progress Notes (Signed)
 INR 3.2 Please see anticoagulation encounter

## 2024-03-02 NOTE — Patient Instructions (Signed)
 Decrease warfarin to 1 tablet daily except 1/2 tablet on Tuesdays, Thursdays and Saturdays Recheck in 6 wks

## 2024-03-05 ENCOUNTER — Ambulatory Visit: Payer: Self-pay

## 2024-03-05 NOTE — Telephone Encounter (Signed)
 noted

## 2024-03-05 NOTE — Telephone Encounter (Signed)
" °  FYI Only or Action Required?: FYI only for provider: appointment scheduled on 1/19.  Patient was last seen in primary care on 02/26/2024 by Joesph Annabella HERO, FNP.  Called Nurse Triage reporting Otalgia.  Symptoms began several weeks ago.  Interventions attempted: OTC medications: tylenol .  Symptoms are: stable.  Triage Disposition: No disposition on file.  Patient/caregiver understands and will follow disposition?: Yes    Reason for Triage: TMJ(Temporomandibular joint) issue/SevereHurt when she eats in back of ear. For 2-3 months  Answer Assessment - Initial Assessment Questions Only wanted to see Tiffany or Rosaline  1. LOCATION: Which ear is involved?      2. ONSET: When did the ear pain start?      Several weeks ago 3. SEVERITY: How bad is the pain?  (Scale 1-10; mild, moderate or severe)     moderate 4. URI SYMPTOMS: Do you have a runny nose or cough?     denies 5. FEVER: Do you have a fever? If Yes, ask: What is your temperature, how was it measured, and when did it start?     no 6. CAUSE: Have you been swimming recently?, How often do you use Q-TIPS?, Have you had any recent air travel or scuba diving?     no 7. OTHER SYMPTOMS: Do you have any other symptoms? (e.g., decreased hearing, dizziness, headache, stiff neck, vomiting)     denies  Protocols used: Earache-A-AH  "

## 2024-03-08 ENCOUNTER — Ambulatory Visit: Admitting: Family Medicine

## 2024-03-11 ENCOUNTER — Ambulatory Visit (INDEPENDENT_AMBULATORY_CARE_PROVIDER_SITE_OTHER): Admitting: Family Medicine

## 2024-03-11 ENCOUNTER — Encounter: Payer: Self-pay | Admitting: Family Medicine

## 2024-03-11 VITALS — BP 104/63 | HR 74 | Temp 97.7°F | Ht 64.0 in | Wt 154.8 lb

## 2024-03-11 DIAGNOSIS — M26609 Unspecified temporomandibular joint disorder, unspecified side: Secondary | ICD-10-CM

## 2024-03-11 DIAGNOSIS — G629 Polyneuropathy, unspecified: Secondary | ICD-10-CM

## 2024-03-11 NOTE — Patient Instructions (Signed)
Neuropathy

## 2024-03-11 NOTE — Progress Notes (Signed)
" ° °  Acute Office Visit  Subjective:     [name], female    DOB: [birth year], 74 y.o.   MRN: [id number]  Chief Complaint  Patient presents with   Otalgia    Otalgia     History of Present Illness   Carol Richmond is a 74 year old female with neuropathy who presents with throat and ear pain.  Otolaryngologic pain - Intermittent pain in the left ear and throat for several weeks - Throat pain is localized and radiates around the area - Pain causes significant discomfort - No other new symptoms  Neuropathy - History of neuropathy of throat - Currently taking gabapentin  for neuropathy management  Temporomandibular joint symptoms - Previous episodes of temporomandibular joint issues with similar symptoms       Review of Systems  HENT:  Positive for ear pain.    As per HPI.      Objective:    BP 104/63   Pulse 74   Temp 97.7 F (36.5 C) (Temporal)   Ht 5' 4 (1.626 m)   Wt 154 lb 12.8 oz (70.2 kg)   SpO2 96%   BMI 26.57 kg/m    Physical Exam Vitals and nursing note reviewed.  Constitutional:      General: She is not in acute distress.    Appearance: She is not ill-appearing, toxic-appearing or diaphoretic.  HENT:     Head:     Jaw: Trismus, tenderness and pain on movement present. No malocclusion.     Right Ear: Tympanic membrane normal.     Left Ear: Tympanic membrane normal.     Nose: Nose normal.     Mouth/Throat:     Mouth: Mucous membranes are moist.     Pharynx: Oropharynx is clear. No oropharyngeal exudate or posterior oropharyngeal erythema.     Tonsils: No tonsillar exudate or tonsillar abscesses. 0 on the right. 0 on the left.  Eyes:     General:        Right eye: No discharge.        Left eye: No discharge.  Pulmonary:     Effort: Pulmonary effort is normal. No respiratory distress.  Musculoskeletal:     Cervical back: Neck supple.     Right lower leg: No edema.     Left lower leg: No edema.  Lymphadenopathy:      Cervical: No cervical adenopathy.  Skin:    General: Skin is warm and dry.  Neurological:     General: No focal deficit present.     Mental Status: She is alert and oriented to person, place, and time.  Psychiatric:        Mood and Affect: Mood normal.     No results found for any visits on 03/11/24.      Assessment & Plan:   Carol Richmond was seen today for otalgia.  Diagnoses and all orders for this visit:  Neuropathy  TMJ (temporomandibular joint disorder)   Assessment and Plan    Neuropathy of throat - Continue gabapentin  as prescribed. - Report to prescribing provider if neuropathy worsens or increased dosage is ineffective.  Temporomandibular joint disorder Intermittent ear pain likely related to TMJ disorder. No infection or abnormalities noted. Reviewed exercises, heat, muscle relaxer for treatment.      Keep scheduled follow up appt.   The patient indicates understanding of these issues and agrees with the plan.   Carol CHRISTELLA Search, FNP   "

## 2024-03-12 ENCOUNTER — Other Ambulatory Visit (INDEPENDENT_AMBULATORY_CARE_PROVIDER_SITE_OTHER): Payer: Self-pay | Admitting: Gastroenterology

## 2024-03-12 DIAGNOSIS — K7682 Hepatic encephalopathy: Secondary | ICD-10-CM

## 2024-03-15 ENCOUNTER — Ambulatory Visit: Payer: Self-pay

## 2024-03-15 DIAGNOSIS — I4891 Unspecified atrial fibrillation: Secondary | ICD-10-CM

## 2024-03-16 ENCOUNTER — Telehealth: Payer: Self-pay | Admitting: Nurse Practitioner

## 2024-03-16 NOTE — Telephone Encounter (Signed)
 Pt c/o medication issue:  1. Name of Medication:   metoprolol  tartrate (LOPRESSOR ) 25 MG tablet    2. How are you currently taking this medication (dosage and times per day)? As prescribed   3. Are you having a reaction (difficulty breathing--STAT)? no  4. What is your medication issue? Patient accidentally took a full metoprolol  today. She is concerned about taking her evening dose. Please advise.

## 2024-03-16 NOTE — Telephone Encounter (Signed)
 Pt calling back to f/u on call from this morning regarding medication

## 2024-03-16 NOTE — Telephone Encounter (Signed)
 Reports taking 25 mg metoprolol  tartrate this morning instead of 12.5 mg and wanting to know if she needed to take her evening dose. Reports she is unable to check her heart rate or blood pressure at home. Advised to hold evening dose of metoprolol  and resume regular schedule metoprolol  tartrate 12.5 mg BID on tomorrow. Verbalized understanding.

## 2024-03-17 LAB — CUP PACEART REMOTE DEVICE CHECK
Battery Remaining Longevity: 144 mo
Battery Voltage: 3.04 V
Brady Statistic AP VP Percent: 0.06 %
Brady Statistic AP VS Percent: 46.67 %
Brady Statistic AS VP Percent: 0.02 %
Brady Statistic AS VS Percent: 53.24 %
Brady Statistic RA Percent Paced: 46.85 %
Brady Statistic RV Percent Paced: 0.08 %
Date Time Interrogation Session: 20260127084806
Implantable Lead Connection Status: 753985
Implantable Lead Connection Status: 753985
Implantable Lead Implant Date: 20231027
Implantable Lead Implant Date: 20231027
Implantable Lead Location: 753859
Implantable Lead Location: 753860
Implantable Lead Model: 3830
Implantable Lead Model: 5076
Implantable Pulse Generator Implant Date: 20231027
Lead Channel Impedance Value: 304 Ohm
Lead Channel Impedance Value: 342 Ohm
Lead Channel Impedance Value: 418 Ohm
Lead Channel Impedance Value: 551 Ohm
Lead Channel Pacing Threshold Amplitude: 0.625 V
Lead Channel Pacing Threshold Amplitude: 0.625 V
Lead Channel Pacing Threshold Pulse Width: 0.4 ms
Lead Channel Pacing Threshold Pulse Width: 0.4 ms
Lead Channel Sensing Intrinsic Amplitude: 16.125 mV
Lead Channel Sensing Intrinsic Amplitude: 16.125 mV
Lead Channel Sensing Intrinsic Amplitude: 2.875 mV
Lead Channel Sensing Intrinsic Amplitude: 2.875 mV
Lead Channel Setting Pacing Amplitude: 2 V
Lead Channel Setting Pacing Amplitude: 2.5 V
Lead Channel Setting Pacing Pulse Width: 0.4 ms
Lead Channel Setting Sensing Sensitivity: 1.2 mV
Zone Setting Status: 755011

## 2024-03-17 NOTE — Telephone Encounter (Signed)
 Spoke with patient regarding instructions on taking her medication. Advised her per note states that she needed to resume dose of Metoprolol  today and take 12.5 mg twice daily. She stated that she was able to check her BP on yesterday evening. 149/63 HR 65. Advised her that will let Carol Richmond know and will call her back if there are any changes.

## 2024-03-17 NOTE — Telephone Encounter (Signed)
 Pt requesting a c/b in regards to previous phone note.

## 2024-03-18 NOTE — Telephone Encounter (Signed)
 Per Almarie response: Agree with previous recommendations.  Her heart rate and blood pressure are in safe range and if she is not having symptoms, she can continue taking metoprolol  to tartrate 12.5 mg twice daily as prescribed.   I recommend that she continues to monitor her heart rate and blood pressure at home.  She needs to let us  know if she notices heart rates consistently below 50, dizziness, lightheadedness, fainting, or worsening fatigue.   In the future, if she actually takes an extra dose again or has concerning symptoms, she needs to contact our office or seek urgent care if symptoms are severe.   Thanks!   Best, Almarie Crate, NP  Patient verbalized understanding and advised her of upcoming appointment

## 2024-03-21 ENCOUNTER — Ambulatory Visit: Payer: Self-pay | Admitting: Student in an Organized Health Care Education/Training Program

## 2024-03-22 ENCOUNTER — Other Ambulatory Visit: Payer: Self-pay | Admitting: Family Medicine

## 2024-03-22 ENCOUNTER — Other Ambulatory Visit: Payer: Self-pay | Admitting: Nurse Practitioner

## 2024-03-22 ENCOUNTER — Other Ambulatory Visit (INDEPENDENT_AMBULATORY_CARE_PROVIDER_SITE_OTHER): Payer: Self-pay | Admitting: Gastroenterology

## 2024-03-22 DIAGNOSIS — R110 Nausea: Secondary | ICD-10-CM

## 2024-03-22 DIAGNOSIS — M26609 Unspecified temporomandibular joint disorder, unspecified side: Secondary | ICD-10-CM

## 2024-03-23 NOTE — Progress Notes (Signed)
 Remote PPM Transmission

## 2024-03-24 NOTE — Telephone Encounter (Signed)
 In accordance with refill protocols, please review and address the following requirements before this medication refill can be authorized:  Labs  Pt needs lab done within 180 days within normal range, Cr and Mg

## 2024-03-29 ENCOUNTER — Ambulatory Visit: Admitting: Nurse Practitioner

## 2024-04-13 ENCOUNTER — Ambulatory Visit

## 2024-05-06 ENCOUNTER — Ambulatory Visit: Admitting: Family Medicine

## 2024-06-14 ENCOUNTER — Ambulatory Visit: Payer: Self-pay

## 2024-09-13 ENCOUNTER — Ambulatory Visit: Payer: Self-pay

## 2024-09-28 ENCOUNTER — Ambulatory Visit: Payer: Self-pay

## 2024-12-13 ENCOUNTER — Ambulatory Visit: Payer: Self-pay

## 2025-03-14 ENCOUNTER — Ambulatory Visit: Payer: Self-pay

## 2025-06-13 ENCOUNTER — Ambulatory Visit: Payer: Self-pay

## 2025-09-12 ENCOUNTER — Ambulatory Visit: Payer: Self-pay

## 2025-12-12 ENCOUNTER — Ambulatory Visit: Payer: Self-pay
